# Patient Record
Sex: Female | Born: 1950 | Race: White | Hispanic: No | Marital: Married | Smoking: Former smoker
Health system: Southern US, Community
[De-identification: ages and names within clinical notes are randomized; demographics above are authoritative.]

## PROBLEM LIST (undated history)

## (undated) DIAGNOSIS — I951 Orthostatic hypotension: Secondary | ICD-10-CM

## (undated) DIAGNOSIS — Z950 Presence of cardiac pacemaker: Secondary | ICD-10-CM

## (undated) DIAGNOSIS — C50919 Malignant neoplasm of unspecified site of unspecified female breast: Secondary | ICD-10-CM

## (undated) DIAGNOSIS — K56609 Unspecified intestinal obstruction, unspecified as to partial versus complete obstruction: Secondary | ICD-10-CM

## (undated) DIAGNOSIS — R569 Unspecified convulsions: Secondary | ICD-10-CM

## (undated) DIAGNOSIS — Z720 Tobacco use: Secondary | ICD-10-CM

## (undated) DIAGNOSIS — M8580 Other specified disorders of bone density and structure, unspecified site: Secondary | ICD-10-CM

## (undated) DIAGNOSIS — J449 Chronic obstructive pulmonary disease, unspecified: Secondary | ICD-10-CM

## (undated) DIAGNOSIS — M1990 Unspecified osteoarthritis, unspecified site: Secondary | ICD-10-CM

## (undated) DIAGNOSIS — I219 Acute myocardial infarction, unspecified: Secondary | ICD-10-CM

## (undated) DIAGNOSIS — I442 Atrioventricular block, complete: Secondary | ICD-10-CM

## (undated) DIAGNOSIS — Z923 Personal history of irradiation: Secondary | ICD-10-CM

## (undated) DIAGNOSIS — G40909 Epilepsy, unspecified, not intractable, without status epilepticus: Secondary | ICD-10-CM

## (undated) DIAGNOSIS — K635 Polyp of colon: Secondary | ICD-10-CM

## (undated) HISTORY — PX: CARDIAC CATHETERIZATION: SHX172

## (undated) HISTORY — DX: Orthostatic hypotension: I95.1

## (undated) HISTORY — DX: Unspecified convulsions: R56.9

## (undated) HISTORY — DX: Tobacco use: Z72.0

## (undated) HISTORY — PX: CATARACT EXTRACTION: SUR2

## (undated) HISTORY — DX: Presence of cardiac pacemaker: Z95.0

## (undated) HISTORY — DX: Polyp of colon: K63.5

## (undated) HISTORY — DX: Epilepsy, unspecified, not intractable, without status epilepticus: G40.909

## (undated) HISTORY — DX: Unspecified intestinal obstruction, unspecified as to partial versus complete obstruction: K56.609

## (undated) HISTORY — PX: ABDOMINAL HYSTERECTOMY: SHX81

## (undated) HISTORY — DX: Atrioventricular block, complete: I44.2

## (undated) HISTORY — PX: LAPAROSCOPY: SHX197

## (undated) HISTORY — DX: Other specified disorders of bone density and structure, unspecified site: M85.80

---

## 1991-02-25 HISTORY — PX: OTHER SURGICAL HISTORY: SHX169

## 1999-01-09 ENCOUNTER — Other Ambulatory Visit: Admission: RE | Admit: 1999-01-09 | Discharge: 1999-01-09 | Payer: Self-pay | Admitting: Family Medicine

## 2001-01-25 HISTORY — PX: EXPLORATORY LAPAROTOMY: SUR591

## 2001-04-29 ENCOUNTER — Other Ambulatory Visit: Admission: RE | Admit: 2001-04-29 | Discharge: 2001-04-29 | Payer: Self-pay | Admitting: Family Medicine

## 2004-01-17 ENCOUNTER — Ambulatory Visit: Payer: Self-pay | Admitting: Family Medicine

## 2004-02-08 ENCOUNTER — Ambulatory Visit: Payer: Self-pay | Admitting: Family Medicine

## 2004-02-08 LAB — FECAL OCCULT BLOOD, GUAIAC: Fecal Occult Blood: NEGATIVE

## 2004-02-27 ENCOUNTER — Ambulatory Visit: Payer: Self-pay | Admitting: Family Medicine

## 2004-03-04 ENCOUNTER — Ambulatory Visit: Payer: Self-pay | Admitting: Family Medicine

## 2004-03-07 ENCOUNTER — Ambulatory Visit: Payer: Self-pay | Admitting: Family Medicine

## 2004-05-06 ENCOUNTER — Ambulatory Visit: Payer: Self-pay | Admitting: Family Medicine

## 2004-07-25 ENCOUNTER — Ambulatory Visit: Payer: Self-pay | Admitting: Family Medicine

## 2004-11-07 ENCOUNTER — Inpatient Hospital Stay: Payer: Self-pay | Admitting: Internal Medicine

## 2005-01-03 ENCOUNTER — Ambulatory Visit: Payer: Self-pay | Admitting: Family Medicine

## 2005-02-05 ENCOUNTER — Ambulatory Visit: Payer: Self-pay | Admitting: Family Medicine

## 2005-03-21 ENCOUNTER — Other Ambulatory Visit: Admission: RE | Admit: 2005-03-21 | Discharge: 2005-03-21 | Payer: Self-pay | Admitting: Family Medicine

## 2005-03-21 ENCOUNTER — Ambulatory Visit: Payer: Self-pay | Admitting: Family Medicine

## 2005-03-21 ENCOUNTER — Encounter: Payer: Self-pay | Admitting: Family Medicine

## 2005-03-21 LAB — CONVERTED CEMR LAB: Pap Smear: NORMAL

## 2005-05-19 ENCOUNTER — Ambulatory Visit: Payer: Self-pay | Admitting: Family Medicine

## 2005-07-24 ENCOUNTER — Ambulatory Visit: Payer: Self-pay | Admitting: Family Medicine

## 2005-08-06 ENCOUNTER — Ambulatory Visit: Payer: Self-pay | Admitting: Family Medicine

## 2005-10-03 ENCOUNTER — Ambulatory Visit: Payer: Self-pay | Admitting: Family Medicine

## 2005-10-09 ENCOUNTER — Ambulatory Visit: Payer: Self-pay | Admitting: Family Medicine

## 2005-11-24 HISTORY — PX: BREAST BIOPSY: SHX20

## 2006-04-10 ENCOUNTER — Ambulatory Visit: Payer: Self-pay | Admitting: Family Medicine

## 2006-04-30 ENCOUNTER — Ambulatory Visit: Payer: Self-pay | Admitting: Internal Medicine

## 2006-04-30 ENCOUNTER — Ambulatory Visit: Payer: Self-pay | Admitting: Family Medicine

## 2006-04-30 LAB — CONVERTED CEMR LAB
ALT: 19 units/L (ref 0–40)
AST: 24 units/L (ref 0–37)
Albumin: 4 g/dL (ref 3.5–5.2)
Alkaline Phosphatase: 80 units/L (ref 39–117)
BUN: 15 mg/dL (ref 6–23)
Calcium: 10.1 mg/dL (ref 8.4–10.5)
Chloride: 103 meq/L (ref 96–112)
Cholesterol: 173 mg/dL (ref 0–200)
Creatinine, Ser: 1 mg/dL (ref 0.4–1.2)
Eosinophils Absolute: 0.1 10*3/uL (ref 0.0–0.6)
GFR calc non Af Amer: 61 mL/min
HDL: 37.4 mg/dL — ABNORMAL LOW (ref >39.0)
Hemoglobin: 14.8 g/dL (ref 12.0–15.0)
LDL Cholesterol: 112 mg/dL — ABNORMAL HIGH (ref 0–99)
Lymphocytes Relative: 18.6 % (ref 12.0–46.0)
MCHC: 34.3 g/dL (ref 30.0–36.0)
Monocytes Absolute: 0.8 10*3/uL — ABNORMAL HIGH (ref 0.2–0.7)
Potassium: 4.4 meq/L (ref 3.5–5.1)
RBC: 4.85 M/uL (ref 3.87–5.11)
RDW: 13.3 % (ref 11.5–14.6)
Total CHOL/HDL Ratio: 4.6
Total Protein: 7.3 g/dL (ref 6.0–8.3)
WBC: 6.4 10*3/uL (ref 4.5–10.5)

## 2006-06-10 ENCOUNTER — Encounter: Payer: Self-pay | Admitting: Family Medicine

## 2006-06-10 DIAGNOSIS — Z87891 Personal history of nicotine dependence: Secondary | ICD-10-CM | POA: Insufficient documentation

## 2006-06-10 DIAGNOSIS — J45909 Unspecified asthma, uncomplicated: Secondary | ICD-10-CM | POA: Insufficient documentation

## 2006-06-10 DIAGNOSIS — Z87898 Personal history of other specified conditions: Secondary | ICD-10-CM | POA: Insufficient documentation

## 2006-06-10 DIAGNOSIS — G4700 Insomnia, unspecified: Secondary | ICD-10-CM | POA: Insufficient documentation

## 2006-07-27 ENCOUNTER — Encounter: Payer: Self-pay | Admitting: Family Medicine

## 2006-07-27 ENCOUNTER — Ambulatory Visit: Payer: Self-pay | Admitting: Family Medicine

## 2006-07-29 ENCOUNTER — Encounter (INDEPENDENT_AMBULATORY_CARE_PROVIDER_SITE_OTHER): Payer: Self-pay | Admitting: *Deleted

## 2006-08-14 ENCOUNTER — Ambulatory Visit: Payer: Self-pay | Admitting: Gastroenterology

## 2006-09-01 ENCOUNTER — Encounter: Payer: Self-pay | Admitting: Gastroenterology

## 2006-09-01 ENCOUNTER — Encounter: Payer: Self-pay | Admitting: Family Medicine

## 2006-09-01 ENCOUNTER — Ambulatory Visit: Payer: Self-pay | Admitting: Gastroenterology

## 2006-09-14 DIAGNOSIS — Z860100 Personal history of colon polyps, unspecified: Secondary | ICD-10-CM | POA: Insufficient documentation

## 2006-09-28 ENCOUNTER — Encounter: Payer: Self-pay | Admitting: Family Medicine

## 2007-06-14 ENCOUNTER — Ambulatory Visit: Payer: Self-pay | Admitting: Family Medicine

## 2007-08-02 ENCOUNTER — Encounter: Payer: Self-pay | Admitting: Family Medicine

## 2007-08-02 ENCOUNTER — Ambulatory Visit: Payer: Self-pay | Admitting: Family Medicine

## 2007-08-03 ENCOUNTER — Ambulatory Visit: Payer: Self-pay | Admitting: Family Medicine

## 2007-08-05 ENCOUNTER — Encounter (INDEPENDENT_AMBULATORY_CARE_PROVIDER_SITE_OTHER): Payer: Self-pay | Admitting: *Deleted

## 2007-08-05 LAB — CONVERTED CEMR LAB
AST: 28 units/L (ref 0–37)
Alkaline Phosphatase: 67 units/L (ref 39–117)
Basophils Absolute: 0 10*3/uL (ref 0.0–0.1)
CO2: 25 meq/L (ref 19–32)
Calcium: 9.7 mg/dL (ref 8.4–10.5)
Chloride: 103 meq/L (ref 96–112)
Creatinine, Ser: 1.1 mg/dL (ref 0.4–1.2)
HCT: 39.6 % (ref 36.0–46.0)
HDL: 40.2 mg/dL (ref >39.0)
MCHC: 34.4 g/dL (ref 30.0–36.0)
Neutro Abs: 4.3 10*3/uL (ref 1.4–7.7)
Sodium: 136 meq/L (ref 135–145)
Total CHOL/HDL Ratio: 4.2
Total Protein: 7.1 g/dL (ref 6.0–8.3)

## 2007-08-06 ENCOUNTER — Ambulatory Visit: Payer: Self-pay | Admitting: Family Medicine

## 2007-08-06 DIAGNOSIS — E875 Hyperkalemia: Secondary | ICD-10-CM | POA: Insufficient documentation

## 2007-08-09 LAB — CONVERTED CEMR LAB: Potassium: 5.1 meq/L (ref 3.5–5.1)

## 2007-11-09 ENCOUNTER — Ambulatory Visit: Payer: Self-pay | Admitting: Family Medicine

## 2007-11-24 ENCOUNTER — Ambulatory Visit: Payer: Self-pay | Admitting: Family Medicine

## 2007-11-24 DIAGNOSIS — H60399 Other infective otitis externa, unspecified ear: Secondary | ICD-10-CM | POA: Insufficient documentation

## 2007-12-02 ENCOUNTER — Ambulatory Visit: Payer: Self-pay | Admitting: Family Medicine

## 2007-12-02 DIAGNOSIS — M545 Low back pain, unspecified: Secondary | ICD-10-CM | POA: Insufficient documentation

## 2007-12-02 DIAGNOSIS — M461 Sacroiliitis, not elsewhere classified: Secondary | ICD-10-CM | POA: Insufficient documentation

## 2007-12-02 DIAGNOSIS — M5430 Sciatica, unspecified side: Secondary | ICD-10-CM

## 2007-12-14 ENCOUNTER — Ambulatory Visit: Payer: Self-pay | Admitting: Family Medicine

## 2007-12-14 DIAGNOSIS — IMO0002 Reserved for concepts with insufficient information to code with codable children: Secondary | ICD-10-CM | POA: Insufficient documentation

## 2007-12-15 ENCOUNTER — Encounter: Admission: RE | Admit: 2007-12-15 | Discharge: 2007-12-15 | Payer: Self-pay | Admitting: Family Medicine

## 2008-06-26 ENCOUNTER — Ambulatory Visit: Payer: Self-pay | Admitting: Family Medicine

## 2008-06-26 DIAGNOSIS — L723 Sebaceous cyst: Secondary | ICD-10-CM

## 2008-06-26 DIAGNOSIS — H6120 Impacted cerumen, unspecified ear: Secondary | ICD-10-CM | POA: Insufficient documentation

## 2008-06-26 DIAGNOSIS — L732 Hidradenitis suppurativa: Secondary | ICD-10-CM | POA: Insufficient documentation

## 2008-06-26 DIAGNOSIS — H6980 Other specified disorders of Eustachian tube, unspecified ear: Secondary | ICD-10-CM

## 2008-07-04 ENCOUNTER — Encounter (INDEPENDENT_AMBULATORY_CARE_PROVIDER_SITE_OTHER): Payer: Self-pay | Admitting: *Deleted

## 2008-08-15 ENCOUNTER — Ambulatory Visit: Payer: Self-pay | Admitting: Family Medicine

## 2008-08-21 ENCOUNTER — Encounter (INDEPENDENT_AMBULATORY_CARE_PROVIDER_SITE_OTHER): Payer: Self-pay | Admitting: *Deleted

## 2009-02-24 DIAGNOSIS — I219 Acute myocardial infarction, unspecified: Secondary | ICD-10-CM

## 2009-02-24 HISTORY — DX: Acute myocardial infarction, unspecified: I21.9

## 2009-08-02 ENCOUNTER — Ambulatory Visit: Payer: Self-pay | Admitting: Family Medicine

## 2009-08-05 LAB — CONVERTED CEMR LAB
ALT: 20 units/L (ref 0–35)
AST: 25 units/L (ref 0–37)
Albumin: 4.1 g/dL (ref 3.5–5.2)
BUN: 20 mg/dL (ref 6–23)
Bilirubin, Direct: 0.1 mg/dL (ref 0.0–0.3)
Creatinine, Ser: 0.9 mg/dL (ref 0.4–1.2)
Eosinophils Relative: 2.8 % (ref 0.0–5.0)
GFR calc non Af Amer: 68.53 mL/min (ref >60)
HDL: 58.4 mg/dL (ref >39.00)
Hemoglobin: 13.8 g/dL (ref 12.0–15.0)
LDL Cholesterol: 117 mg/dL — ABNORMAL HIGH (ref 0–99)
MCHC: 34.3 g/dL (ref 30.0–36.0)
Neutro Abs: 3.2 10*3/uL (ref 1.4–7.7)
Neutrophils Relative %: 61.9 % (ref 43.0–77.0)
Platelets: 250 10*3/uL (ref 150.0–400.0)
RDW: 13.6 % (ref 11.5–14.6)
TSH: 1.53 microintl units/mL (ref 0.35–5.50)
Total Bilirubin: 1 mg/dL (ref 0.3–1.2)
Total CHOL/HDL Ratio: 3
Total Protein: 6.8 g/dL (ref 6.0–8.3)

## 2009-08-06 ENCOUNTER — Ambulatory Visit: Payer: Self-pay | Admitting: Family Medicine

## 2009-08-06 DIAGNOSIS — Z780 Asymptomatic menopausal state: Secondary | ICD-10-CM | POA: Insufficient documentation

## 2009-08-10 ENCOUNTER — Ambulatory Visit: Payer: Self-pay | Admitting: Internal Medicine

## 2009-08-10 ENCOUNTER — Encounter: Payer: Self-pay | Admitting: Family Medicine

## 2009-08-14 ENCOUNTER — Encounter (INDEPENDENT_AMBULATORY_CARE_PROVIDER_SITE_OTHER): Payer: Self-pay | Admitting: *Deleted

## 2009-08-15 ENCOUNTER — Encounter (INDEPENDENT_AMBULATORY_CARE_PROVIDER_SITE_OTHER): Payer: Self-pay | Admitting: *Deleted

## 2009-08-16 ENCOUNTER — Encounter: Payer: Self-pay | Admitting: Family Medicine

## 2009-08-16 ENCOUNTER — Ambulatory Visit: Payer: Self-pay | Admitting: Family Medicine

## 2009-08-20 ENCOUNTER — Encounter (INDEPENDENT_AMBULATORY_CARE_PROVIDER_SITE_OTHER): Payer: Self-pay | Admitting: *Deleted

## 2009-09-06 ENCOUNTER — Ambulatory Visit: Payer: Self-pay | Admitting: Family Medicine

## 2009-09-11 LAB — CONVERTED CEMR LAB: Potassium: 5.3 meq/L — ABNORMAL HIGH (ref 3.5–5.1)

## 2009-09-14 ENCOUNTER — Encounter (INDEPENDENT_AMBULATORY_CARE_PROVIDER_SITE_OTHER): Payer: Self-pay | Admitting: *Deleted

## 2009-09-17 ENCOUNTER — Ambulatory Visit: Payer: Self-pay | Admitting: Gastroenterology

## 2009-10-01 ENCOUNTER — Ambulatory Visit: Payer: Self-pay | Admitting: Gastroenterology

## 2009-10-01 LAB — HM COLONOSCOPY

## 2009-10-03 ENCOUNTER — Encounter: Payer: Self-pay | Admitting: Gastroenterology

## 2009-10-19 ENCOUNTER — Ambulatory Visit: Payer: Self-pay | Admitting: Family Medicine

## 2009-10-19 DIAGNOSIS — L03119 Cellulitis of unspecified part of limb: Secondary | ICD-10-CM

## 2009-10-19 DIAGNOSIS — L02419 Cutaneous abscess of limb, unspecified: Secondary | ICD-10-CM | POA: Insufficient documentation

## 2009-10-22 ENCOUNTER — Ambulatory Visit: Payer: Self-pay | Admitting: Family Medicine

## 2010-03-24 LAB — CONVERTED CEMR LAB
BUN: 20 mg/dL (ref 6–23)
Basophils Relative: 0.4 % (ref 0.0–3.0)
Calcium: 10.1 mg/dL (ref 8.4–10.5)
Chloride: 109 meq/L (ref 96–112)
Creatinine, Ser: 1.1 mg/dL (ref 0.4–1.2)
Eosinophils Absolute: 0.2 10*3/uL (ref 0.0–0.7)
Eosinophils Relative: 3.4 % (ref 0.0–5.0)
GFR calc non Af Amer: 53.84 mL/min (ref >60)
Glucose, Bld: 92 mg/dL (ref 70–99)
HDL: 52.1 mg/dL (ref >39.00)
Hemoglobin: 12.7 g/dL (ref 12.0–15.0)
LDL Cholesterol: 84 mg/dL (ref 0–99)
Lymphocytes Relative: 28.4 % (ref 12.0–46.0)
MCV: 88.6 fL (ref 78.0–100.0)
Neutro Abs: 2.7 10*3/uL (ref 1.4–7.7)
Neutrophils Relative %: 56.6 % (ref 43.0–77.0)
Platelets: 249 10*3/uL (ref 150.0–400.0)
RBC: 4.13 M/uL (ref 3.87–5.11)
Total CHOL/HDL Ratio: 3
Triglycerides: 65 mg/dL (ref 0.0–149.0)
VLDL: 13 mg/dL (ref 0.0–40.0)
WBC: 4.8 10*3/uL (ref 4.5–10.5)

## 2010-03-28 NOTE — Miscellaneous (Signed)
   Clinical Lists Changes  Observations: Added new observation of MAMMO DUE: 08/17/2010 (08/16/2009 12:41) Added new observation of MAMMOGRAM: Normal (08/16/2009 12:41)

## 2010-03-28 NOTE — Letter (Signed)
Summary: Results Follow up Letter  [place] [facility]  [address] [place], [place] [address]   Phone: [phone]  Fax: [phone]    08/15/2009 MRN: [id number]    [facility] [address] [place], [place]  [address]    Dear Diana Cook,  The following are the results of your recent test(s):  Test         Result    Pap Smear:        Normal _____  Not Normal _____ Comments: ______________________________________________________ Cholesterol: LDL(Bad cholesterol):         Your goal is less than:         HDL (Good cholesterol):       Your goal is more than: Comments:  ______________________________________________________ Mammogram:        Normal _____  Not Normal _____ Comments:  ___________________________________________________________________ Hemoccult:        Normal _____  Not normal _______ Comments:    _____________________________________________________________________ Other Tests:   Bone Density:  Bone density is normal. Continue calcium and vitamin D.    We routinely do not discuss normal results over the telephone.  If you desire a copy of the results, or you have any questions about this information we can discuss them at your next office visit.   Sincerely,    Marne A. Milinda Antis, M.D.  MAT:lsf

## 2010-03-28 NOTE — Consult Note (Signed)
Summary: [facility]   Imported By: Sherian Rein 11/09/2009 08:26:59  _____________________________________________________________________  External Attachment:    Type:   Image     Comment:   External Document

## 2010-03-28 NOTE — Letter (Signed)
Summary: [facility] Instructions  [facility]  [address] [place], [place] [address]   Phone: [phone]  Fax: [phone]       TEMPIE GIBEAULT    1950-07-18    MRN: [id number]        Procedure Day /Date:  10/01/09  Monday     Arrival Time:  9:30am      Procedure Time:  10:30am     Location of Procedure:                    _ x_   [facility] Center (4th Floor)   PREPARATION FOR COLONOSCOPY WITH MOVIPREP   Starting 5 days prior to your procedure _8/3/11 _ do not eat nuts, seeds, popcorn, corn, beans, peas,  salads, or any raw vegetables.  Do not take any fiber supplements (e.g. Metamucil, Citrucel, and Benefiber).  THE DAY BEFORE YOUR PROCEDURE         DATE:   09/30/09   DAY:  Sunday  1.  Drink clear liquids the entire day-NO SOLID FOOD  2.  Do not drink anything colored red or purple.  Avoid juices with pulp.  No orange juice.  3.  Drink at least 64 oz. (8 glasses) of fluid/clear liquids during the day to prevent dehydration and help the prep work efficiently.  CLEAR LIQUIDS INCLUDE: Water Jello Ice Popsicles Tea (sugar ok, no milk/cream) Powdered fruit flavored drinks Coffee (sugar ok, no milk/cream) Gatorade Juice: apple, white grape, white cranberry  Lemonade Clear bullion, consomm, broth Carbonated beverages (any kind) Strained chicken noodle soup Hard Candy                             4.  In the morning, mix first dose of MoviPrep solution:    Empty 1 Pouch A and 1 Pouch B into the disposable container    Add lukewarm drinking water to the top line of the container. Mix to dissolve    Refrigerate (mixed solution should be used within 24 hrs)  5.  Begin drinking the prep at 5:00 p.m. The MoviPrep container is divided by 4 marks.   Every 15 minutes drink the solution down to the next mark (approximately 8 oz) until the full liter is complete.   6.  Follow completed prep with 16 oz of clear liquid of your choice (Nothing red or purple).   Continue to drink clear liquids until bedtime.  7.  Before going to bed, mix second dose of MoviPrep solution:    Empty 1 Pouch A and 1 Pouch B into the disposable container    Add lukewarm drinking water to the top line of the container. Mix to dissolve    Refrigerate  THE DAY OF YOUR PROCEDURE      DATE:   10/01/09  DAY:  Monday  Beginning at  5:30am  (5 hours before procedure):         1. Every 15 minutes, drink the solution down to the next mark (approx 8 oz) until the full liter is complete.  2. Follow completed prep with 16 oz. of clear liquid of your choice.    3. You may drink clear liquids until 8:30am   (2 HOURS BEFORE PROCEDURE).   MEDICATION INSTRUCTIONS  Unless otherwise instructed, you should take regular prescription medications with a small sip of water   as early as possible the morning of your procedure.  OTHER INSTRUCTIONS  You will need a responsible adult at least 62 years of age to accompany you and drive you home.   This person must remain in the waiting room during your procedure.  Wear loose fitting clothing that is easily removed.  Leave jewelry and other valuables at home.  However, you may wish to bring a book to read or  an iPod/MP3 player to listen to music as you wait for your procedure to start.  Remove all body piercing jewelry and leave at home.  Total time from sign-in until discharge is approximately 2-3 hours.  You should go home directly after your procedure and rest.  You can resume normal activities the  day after your procedure.  The day of your procedure you should not:   Drive   Make legal decisions   Operate machinery   Drink alcohol   Return to work  You will receive specific instructions about eating, activities and medications before you leave.    The above instructions have been reviewed and explained to me by   Clide Cliff, RN_______________________    I fully understand and can verbalize  these instructions _____________________________ Date _________

## 2010-03-28 NOTE — Assessment & Plan Note (Signed)
Summary: CPX/CLE   Vital Signs:  Patient profile:   60 year old female Height:      68 inches Weight:      159.25 pounds BMI:     24.30 O2 Sat:      95 % on Room air Temp:     98.1 degrees F oral Pulse rate:   76 / minute Pulse rhythm:   regular BP sitting:   122 / 68  (left arm) Cuff size:   regular  Vitals Entered By: Lewanda Rife LPN (August 06, 2009 2:28 PM)  O2 Flow:  Room air CC: CPX LMP complete hyst 2000   History of Present Illness: here for health mt exam   wt is down 31 lb today  doing wt watchers -- lost total of 32 lb  works well for her - but hard to keep up  is exercising too - does the [employer] tapes at least 3 times per week     bp good 122/68  colonosc 08- due for 3 y f/u for polyps in 7/11  tab--smokes occasionally - almost quit  still has a hold on her    hyst in past nl pap 07 no symptoms or problems    mam 6/10 self exam --no lumps on self exam   nl dexa in 08 lost one inch now  is not good about calcium     K was 5.3 this draw no mvi or other supplements  eats 1 banana per day  no oj eats a lot of canolope   lipids trig 56, HDL 58 and LDL 117  dtap was 09 Pneumovax 07 ? shingles status   takes septra and clinca lotion for breakouts as needed derm - Dr Orson Aloe  wants to switch from me   has lost her sex drive past menopause     Allergies: 1)  ! Codeine  Past History:  Past Medical History: Last updated: 06/14/2007 Asthma- as a child, mild now Seizure disorder colon polyps smoker all rhinitis   Past Surgical History: Last updated: 09/14/2006 Cataract extraction Hysterectomy- endometriosis (1993) Laparoscopy Dexa- normal (01/2000), normal (06/20040, normal (03/20080 Hospital- intestinal obstruction surgery (12/2000) Breast biopsy- benign inflammatory changes (11/2005) colonoscopy-polyp (tubular adenoma)7/08- rec re check in 3 years  Family History: Last updated: 06/14/2007 mother CVA, MI,  dementia  father CAD, parkinsons 2 uncles remotely with cancer ? kind   Social History: Last updated: 06/14/2007 Patient currently smokes.  exercises- does walking tapes  once in a while drinks wine married 40 years   Risk Factors: Smoking Status: current (06/10/2006)  Review of Systems General:  Denies fatigue and malaise. Eyes:  Denies blurring and eye pain. CV:  Denies chest pain or discomfort, palpitations, and shortness of breath with exertion. Resp:  Denies cough and wheezing. GI:  Denies abdominal pain, indigestion, nausea, and vomiting. GU:  Denies abnormal vaginal bleeding, discharge, and dysuria. MS:  Denies joint pain, joint redness, and joint swelling. Derm:  Denies lesion(s), poor wound healing, and rash. Psych:  mood is ok . Endo:  Denies cold intolerance, excessive thirst, excessive urination, and heat intolerance. Heme:  Denies abnormal bruising and bleeding.  Physical Exam  Ears:  mild cerumen R ear canal  Breasts:  No mass, nodules, thickening, tenderness, bulging, retraction, inflamation, nipple discharge or skin changes noted.   Lungs:  diffusely distant bs  no crackles or wheeze    Impression & Recommendations:  Problem # 1:  HEALTH MAINTENANCE EXAM (ICD-V70.0) Assessment Comment Only  reviewed health habits including diet, exercise and skin cancer prevention reviewed health maintenance list and family history commended on good habits and wt loss urged to totally quit smoking rev labs in detail today  Problem # 2:  POSTMENOPAUSAL STATUS (ICD-V49.81) Assessment: Unchanged order dexa in light of ht loss and risk factor of smoking  Orders: Radiology Referral (Radiology)  Problem # 3:  SMOKER (ICD-305.1) Assessment: Unchanged discussed in detail risks of smoking, and possible outcomes including COPD, vascular dz, cancer and also respiratory infections/sinus problems  pt voiced understanding-is ready to quit the occas cigarette enc her to try pc  of nicotine gum instead - and update The following medications were removed from the medication list:    Chantix Starting Month Pak 0.5 Mg X 11 & 1 Mg X 42 Tabs (Varenicline tartrate) .Marland Kitchen... Take by mouth as directed    Chantix 1 Mg Tabs (Varenicline tartrate) .Marland Kitchen... 1 by mouth two times a day after finishing starter pack  Orders: Radiology Referral (Radiology)  Problem # 4:  HIDRADENITIS SUPPURATIVA (OZH-086.57) Assessment: Unchanged  refil septra for as needed use  update if not imp  Orders: Prescription Created Electronically [phone])  Problem # 5:  HYPERKALEMIA (ICD-276.7) Assessment: Deteriorated mild - suspect from large fruit intake ? -- will cut down cantalope and bananas  re check 2 wk  Problem # 6:  OTHER SCREENING MAMMOGRAM (ICD-V76.12) Assessment: Comment Only annual mammogram scheduled adv pt to continue regular self breast exams non remarkable breast exam today   Orders: Radiology Referral (Radiology)  Complete Medication List: 1)  Calcium Plus D 600- 400iu  .... Take 2 by mouth daily 2)  Smz-tmp Ds 800-160 Mg Tabs (Sulfamethoxazole-trimethoprim) .... Take 1-2 by mouth daily as needed for outbreak of hydradenitis 3)  Clindamycin Phosphate 1 % Lotn (Clindamycin phosphate) .... Apply to affected area once daily as needed affected area for breakouts 4)  Flonase 50 Mcg/act Susp (Fluticasone propionate) .... 2 sprays in each nostril once daily as needed 5)  Proair Hfa 108 (90 Base) Mcg/act Aers (Albuterol sulfate) .... 2 puffs up to every 4 hours as needed wheezing  Patient Instructions: 1)  cut back on cantalope and bananas due to high potassium level  2)  schedule non fasting labs in 2 weeks -- K level for hyperkalemia  3)  we will schedule mammogram and bone density tests at check out  4)  the current recommendation for calcium intake is 1200-1500 mg daily with 1000 IU of vitamin D  5)  keep up the great diet and exercise  Prescriptions: PROAIR HFA 108 (90 BASE)  MCG/ACT AERS (ALBUTEROL SULFATE) 2 puffs up to every 4 hours as needed wheezing  #1 mdi x 5   Entered and Authorized by:   Judith Part MD   Signed by:   Judith Part MD on 08/06/2009   Method used:   Electronically to        [id number] ([facility])       [address]       [place], [place]  [address]       Ph: [phone]       Fax: [phone]   RxID:   [id number] FLONASE 50 MCG/ACT SUSP (FLUTICASONE PROPIONATE) 2 sprays in each nostril once daily as needed  #1 mdi x 11   Entered and Authorized by:   Judith Part MD   Signed by:   Judith Part MD on 08/06/2009   Method used:  Electronically to        [id number] (retail)       [address]       [place], [place]  [address]       Ph: [phone]       Fax: [phone]   RxID:   [id number] SMZ-TMP DS 800-160 MG  TABS (SULFAMETHOXAZOLE-TRIMETHOPRIM) take 1-2 by mouth daily as needed for outbreak of hydradenitis  #30 x 5   Entered and Authorized by:   Judith Part MD   Signed by:   Judith Part MD on 08/06/2009   Method used:   Electronically to        [id number] ([facility]       [address]       [place], [place]  [address]       Ph: [phone]       Fax: [phone]   RxID:   [id number]   Current Allergies (reviewed today): ! CODEINE

## 2010-03-28 NOTE — Assessment & Plan Note (Signed)
Summary: leg is swollen/alc   Vital Signs:  Patient profile:   60 year old female Height:      68 inches Weight:      159.75 pounds BMI:     24.38 Temp:     98.2 degrees F oral Pulse rate:   84 / minute Pulse rhythm:   regular BP sitting:   116 / 60  (left arm) Cuff size:   regular  Vitals Entered By: Lewanda Rife LPN (October 19, 2009 12:25 PM) CC: Left leg is swollen and hurts for 3 days, pain level now is 7. No known injury    History of Present Illness: went and had colonosc done and had to have bx hemorroids - got worse than usual   hydraadenitis - in groin is worse - takes clinda lotion an dthe sulfa abx  soaking with epsom salts   crack in root canal - and in process of getting 2 implants- not infected  L leg started to swell 2 days ago  last night was hot to the touch -- and tender to the touch  took 3 advil twice last night -- did help some  is worst over ant ankle - swelling there too  no insect bite or scratch/ no tick bites or fever  no shortness of breath  no knots in leg   quit smoking -- with nicotine gum - doing very well  doing this for about 2 months  is really proud   no exercise in 3 weeks due to the hydradenitis  also sees Dr Orson Aloe    Allergies: 1)  ! Codeine  Past History:  Past Medical History: Last updated: 06/14/2007 Asthma- as a child, mild now Seizure disorder colon polyps smoker all rhinitis   Past Surgical History: Last updated: 09/14/2006 Cataract extraction Hysterectomy- endometriosis (1993) Laparoscopy Dexa- normal (01/2000), normal (06/20040, normal (03/20080 Hospital- intestinal obstruction surgery (12/2000) Breast biopsy- benign inflammatory changes (11/2005) colonoscopy-polyp (tubular adenoma)7/08- rec re check in 3 years  Family History: Last updated: 06/14/2007 mother CVA, MI, dementia  father CAD, parkinsons 2 uncles remotely with cancer ? kind   Social History: Last updated: 06/14/2007 Patient  currently smokes.  exercises- does walking tapes  once in a while drinks wine married 40 years   Risk Factors: Smoking Status: current (06/10/2006)  Review of Systems General:  Complains of fatigue; denies chills and fever. Eyes:  Denies blurring and eye irritation. ENT:  Denies sore throat. CV:  Denies chest pain or discomfort, palpitations, and shortness of breath with exertion. Resp:  Denies cough and wheezing. GI:  Denies abdominal pain, change in bowel habits, nausea, and vomiting. GU:  Denies dysuria and urinary frequency. MS:  Denies muscle aches and cramps. Derm:  Complains of itching, lesion(s), and rash. Neuro:  Denies numbness and tingling. Heme:  Denies abnormal bruising and bleeding.  Physical Exam  General:  Well-developed,well-nourished,in no acute distress; alert,appropriate and cooperative throughout examination Head:  normocephalic, atraumatic, and no abnormalities observed.   Eyes:  vision grossly intact, pupils equal, pupils round, and pupils reactive to light.  no conjunctival pallor, injection or icterus  Mouth:  pharynx pink and moist.   Neck:  supple with full rom and no masses or thyromegally, no JVD or carotid bruit  Lungs:  diffusely distant bs  no crackles or wheeze  Heart:  Normal rate and regular rhythm. S1 and S2 normal without gallop, murmur, click, rub or other extra sounds. Abdomen:  soft, non-tender, and normal bowel sounds.  Msk:  see skin exam   Pulses:  R and L carotid,radial,femoral,dorsalis pedis and posterior tibial pulses are full and equal bilaterally Extremities:  no palp cords either leg neg homan's sign  Neurologic:  sensation intact to light touch, gait normal, and DTRs symmetrical and normal.   Skin:  many scabbed lesions with dried blood over mons pubis  inguinal folds are clear  legs are clear   3-4 cm area of superficial redness and swelling over distal L leg - just superior to ankle  is tender to touch nl rom  ankle Cervical Nodes:  No lymphadenopathy noted Inguinal Nodes:  No significant adenopathy Psych:  normal affect, talkative and pleasant    Impression & Recommendations:  Problem # 1:  HIDRADENITIS SUPPURATIVA (ICD-705.83) Assessment Deteriorated this looks worse than usual and a little atypical on mons pubis area - with scabbing (no excoriation) is midline so doubt shingles ref to derm in light of atypical presentation will stop sulfa ant take augmentin Orders: Dermatology Referral (Derma) Prescription Created Electronically [phone])  Problem # 2:  CELLULITIS, ANKLE, LEFT (ICD-682.6) Assessment: New  area of superficial redness and mild swelling overlying shin just above L ankle  is tender tx with augmentin and re check monday  if worse- adv to seek care in ER over weekend(or if fever or other symptoms) Her updated medication list for this problem includes:    Smz-tmp Ds 800-160 Mg Tabs (Sulfamethoxazole-trimethoprim) .Marland Kitchen... Take 1-2 by mouth daily as needed for outbreak of hydradenitis    Augmentin 875-125 Mg Tabs (Amoxicillin-pot clavulanate) .Marland Kitchen... 1 by mouth two times a day for 10 days  Orders: Prescription Created Electronically [phone])  Complete Medication List: 1)  Calcium Plus D 600- 400iu  .... Take 2 by mouth daily 2)  Smz-tmp Ds 800-160 Mg Tabs (Sulfamethoxazole-trimethoprim) .... Take 1-2 by mouth daily as needed for outbreak of hydradenitis 3)  Clindamycin Phosphate 1 % Lotn (Clindamycin phosphate) .... Apply to affected area once daily as needed affected area for breakouts 4)  Flonase 50 Mcg/act Susp (Fluticasone propionate) .... 2 sprays in each nostril once daily as needed 5)  Proair Hfa 108 (90 Base) Mcg/act Aers (Albuterol sulfate) .... 2 puffs up to every 4 hours as needed wheezing 6)  Advil 200 Mg Tabs (Ibuprofen) .... Otc as directed. 7)  Augmentin 875-125 Mg Tabs (Amoxicillin-pot clavulanate) .Marland Kitchen.. 1 by mouth two times a day for 10 days  Patient  Instructions: 1)  keep sores clean with antibacterial soap and water -and use triple antibiotic ointment (not neosporin)  2)  take the augmentin as directed  3)  stop the smz- tmp (sulfa abx)  4)  we will do derm referral at check out  5)  if worse swelling/ redness or any fever - call or go to er if after hours  6)  follow up with me monday  Prescriptions: AUGMENTIN 875-125 MG TABS (AMOXICILLIN-POT CLAVULANATE) 1 by mouth two times a day for 10 days  #20 x 0   Entered and Authorized by:   Judith Part MD   Signed by:   Judith Part MD on 10/19/2009   Method used:   Electronically to        [id number]       [address]       [place], [place]  [address]       Ph: [phone]       Fax: [phone]   RxID:   [id number]  Current Allergies (reviewed today): ! CODEINE

## 2010-03-28 NOTE — Letter (Signed)
Summary: Results Follow up Letter  [place] [facility]  [address] [place], [place] [address]   Phone: [phone]  Fax: [phone]    08/20/2009 MRN: [id number]    [facility] [address] [place], [place]  [address]    Dear Ms. Troup,  The following are the results of your recent test(s):  Test         Result    Pap Smear:        Normal _____  Not Normal _____ Comments: ______________________________________________________ Cholesterol: LDL(Bad cholesterol):         Your goal is less than:         HDL (Good cholesterol):       Your goal is more than: Comments:  ______________________________________________________ Mammogram:        Normal __X___  Not Normal _____ Comments:  Yearly follow up is recommended.   ___________________________________________________________________ Hemoccult:        Normal _____  Not normal _______ Comments:    _____________________________________________________________________ Other Tests:    We routinely do not discuss normal results over the telephone.  If you desire a copy of the results, or you have any questions about this information we can discuss them at your next office visit.   Sincerely,    Marne A. Milinda Antis, M.D.  MAT:lsf

## 2010-03-28 NOTE — Procedures (Signed)
Summary: Colonoscopy  Patient: Diana Cook Note: All result statuses are Final unless otherwise noted.  Tests: (1) Colonoscopy (COL)   COL Colonoscopy           DONE     [facility]     [address]     [place], [place]  [address]           COLONOSCOPY PROCEDURE REPORT           PATIENT:  Diana Cook, Diana Cook  MR#:  [id number]     BIRTHDATE:  [birth year], 59 yrs. old  GENDER:  female     ENDOSCOPIST:  Rachael Fee, MD     PROCEDURE DATE:  10/01/2009     PROCEDURE:  Colonoscopy with snare polypectomy     ASA CLASS:  Class II     INDICATIONS:  serrated adenoma removed 3 years ago     MEDICATIONS:   Fentanyl 75 mcg IV, Versed 6 mg IV           DESCRIPTION OF PROCEDURE:   After the risks benefits and     alternatives of the procedure were thoroughly explained, informed     consent was obtained.  Digital rectal exam was performed and     revealed no rectal masses.   The LB [id number] endoscope     was introduced through the anus and advanced to the cecum, which     was identified by both the appendix and ileocecal valve, without     limitations.  The quality of the prep was adequate, using     MoviPrep.  The instrument was then slowly withdrawn as the colon     was fully examined.     <<PROCEDUREIMAGES>>           FINDINGS:  Four sessile polyps were found, all were removed and     sent to pathology (jar 1). These ranged in size from 3 to 11mm,     most appeared hyperplastic. They were located in descending,     sigmoid and rectum segments. Three were removed with cold snare,     the largest (in descending colon) required snare/cautery (see     image3 and image8).  This was otherwise a normal examination of     the colon (see image9, image1, and image2).   Retroflexed views in     the rectum revealed no abnormalities.    The scope was then     withdrawn from the patient and the procedure completed.           COMPLICATIONS:  None     ENDOSCOPIC IMPRESSION:     1)  Four polyps, most appeared hyperplastic.  All were removed     and sent to pathology     2) Otherwise normal examination           RECOMMENDATIONS:     1) Given your personal history of adenomatous (pre-cancerous)     polyps, you will need a repeat colonoscopy in 5 years even if the     polyps removed today are not adenomatous.     2) You will receive a letter within 1-2 weeks with the results     of your biopsy as well as final recommendations. Please call my     office if you have not received a letter after 3 weeks.           ______________________________     Rachael Fee, MD  cc: Roxy Manns, MD           n.     Rosalie Doctor:   Rachael Fee at 10/01/2009 11:23 AM           Diana Cook, [id number]  Note: An exclamation mark (!) indicates a result that was not dispersed into the flowsheet. Document Creation Date: 10/01/2009 11:24 AM _______________________________________________________________________  (1) Order result status: Final Collection or observation date-time: 10/01/2009 11:17 Requested date-time:  Receipt date-time:  Reported date-time:  Referring Physician:   Ordering Physician: Rob Bunting [phone]) Specimen Source:  Source: Launa Grill Order Number: [id number] Lab site:   Appended Document: Colonoscopy recall     Procedures Next Due Date:    Colonoscopy: 09/2014

## 2010-03-28 NOTE — Miscellaneous (Signed)
Summary: BONE DENSITY  Clinical Lists Changes  Orders: Added new Test order of T-Bone Densitometry (77080) - Signed Added new Test order of T-Lumbar Vertebral Assessment (77082) - Signed 

## 2010-03-28 NOTE — Miscellaneous (Signed)
Summary: REC COL...AS.  Clinical Lists Changes  Medications: Added new medication of MOVIPREP 100 GM  SOLR (PEG-KCL-NACL-NASULF-NA ASC-C) As directed - Signed Rx of MOVIPREP 100 GM  SOLR (PEG-KCL-NACL-NASULF-NA ASC-C) As directed;  #1 x 0;  Signed;  Entered by: Clide Cliff RN;  Authorized by: Rachael Fee MD;  Method used: Electronically to [id number] [address], [place], [place]  [address], Ph: [phone], Fax: [phone] Observations: Added new observation of ALLERGY REV: Done (09/17/2009 9:32)    Prescriptions: MOVIPREP 100 GM  SOLR (PEG-KCL-NACL-NASULF-NA ASC-C) As directed  #1 x 0   Entered by:   Clide Cliff RN   Authorized by:   Rachael Fee MD   Signed by:   Clide Cliff RN on 09/17/2009   Method used:   Electronically to        [id number]       [address]       [place], [place]  [address]       Ph: [phone]       Fax: [phone]   RxID:   [id number]

## 2010-03-28 NOTE — Letter (Signed)
Summary: Results Letter  [facility]  [address] [place], [place] [address]   Phone: [phone]  Fax: [phone]        October 03, 2009 MRN: [id number]    [facility] [address] [place], [place]  [address]    Dear Ms. Misenheimer,   One of the polyps removed during your recent procedure was proven to be adenomatous.  These are pre-cancerous polyps that may have grown into cancers if they had not been removed.  Based on current nationally recognized surveillance guidelines, I recommend that you have a repeat colonoscopy in 5 years.  We will therefore put your information in our reminder system and will contact you in 5 years to schedule a repeat procedure.  Please call if you have any questions or concerns.       Sincerely,  Rachael Fee MD  This letter has been electronically signed by your physician.  Appended Document: Results Letter letter mailed

## 2010-03-28 NOTE — Letter (Signed)
Summary: Colonoscopy Letter  [facility]  [address] [place], [place] [address]   Phone: [phone]  Fax: [phone]      August 14, 2009 MRN: [id number]   [facility] [address] [place], [place]  [address]   Dear Ms. Simon,   According to your medical record, it is time for you to schedule a Colonoscopy. The American Cancer Society recommends this procedure as a method to detect early colon cancer. Patients with a family history of colon cancer, or a personal history of colon polyps or inflammatory bowel disease are at increased risk.  This letter has been generated based on the recommendations made at the time of your procedure. If you feel that in your particular situation this may no longer apply, please contact our office.  Please call our office at [phone] to schedule this appointment or to update your records at your earliest convenience.  Thank you for cooperating with [place] to provide you with the very best care possible.   Sincerely,  Rachael Fee, M.D.  [facility] [phone]

## 2010-08-08 ENCOUNTER — Telehealth: Payer: Self-pay | Admitting: *Deleted

## 2010-08-08 NOTE — Telephone Encounter (Signed)
Gentleman answered phone and said he was not sure where pt was but he would have her call our office back.

## 2010-08-08 NOTE — Telephone Encounter (Signed)
She may need both a mam and [place] -- I (or first avail doc) need to examine it first  Please sched appt

## 2010-08-08 NOTE — Telephone Encounter (Signed)
Patient feels a small mass on her right breast. She is asking for a referral to have a mammogram. She wants to go the [facility]

## 2010-08-09 NOTE — Telephone Encounter (Signed)
Patient notified as instructed by telephone. Pt already scheduled appt with Dr Milinda Antis 08/12/10.

## 2010-08-10 ENCOUNTER — Encounter: Payer: Self-pay | Admitting: Family Medicine

## 2010-08-12 ENCOUNTER — Ambulatory Visit (INDEPENDENT_AMBULATORY_CARE_PROVIDER_SITE_OTHER): Payer: 59 | Admitting: Family Medicine

## 2010-08-12 ENCOUNTER — Encounter: Payer: Self-pay | Admitting: Family Medicine

## 2010-08-12 DIAGNOSIS — Z1231 Encounter for screening mammogram for malignant neoplasm of breast: Secondary | ICD-10-CM | POA: Insufficient documentation

## 2010-08-12 DIAGNOSIS — N6019 Diffuse cystic mastopathy of unspecified breast: Secondary | ICD-10-CM

## 2010-08-12 NOTE — Assessment & Plan Note (Signed)
With hx of intermittent lumps and cyst in past  Does drink caffiene  Unable to feel any lump today-either myself or pt  Will send for her regular screening mam Counseled on caff and smoking  Enc to continue self exams

## 2010-08-12 NOTE — Progress Notes (Signed)
Subjective:    Patient ID: Diana Cook, female    DOB: 1950/12/24, 60 y.o.   MRN: [id number]  HPI Here for breast lump in r side  Is hard to tell and wanted to get it checked   Both of her best friends are going through breast cancer tx right now  Not painful  Not draining  No nipple d/c  Pt has hx of fibrocystic breasts with neg bx in 07 Had a cyst    Last nl mam 6/11- is due now  No family hx of breast cancer  Hx of smoker     Review of Systems Review of Systems  Constitutional: Negative for fever, appetite change, fatigue and unexpected weight change.  Eyes: Negative for pain and visual disturbance.  Respiratory: Negative for cough and shortness of breath.   Cardiovascular: Negative.for cp or soreness    Gastrointestinal: Negative for nausea, diarrhea and constipation.  Genitourinary: Negative for urgency and frequency.  Skin: Negative for pallor. or rash  Neurological: Negative for weakness, light-headedness, numbness and headaches.  Hematological: Negative for adenopathy. Does not bruise/bleed easily.  Psychiatric/Behavioral: Negative for dysphoric mood. The patient is not nervous/anxious.          Objective:   Physical Exam  Constitutional: She appears well-developed and well-nourished.  Neck: Normal range of motion. Neck supple. No JVD present. No thyromegaly present.  Cardiovascular: Normal rate, regular rhythm and normal heart sounds.   Pulmonary/Chest:       CTA- diffusely distant bs   Abdominal: Soft. Bowel sounds are normal. There is no tenderness.  Genitourinary: No breast swelling, tenderness, discharge or bleeding.       Generally dense and fibrocystic breasts No discrete masses noted No skin change or nipple d/c   Musculoskeletal: She exhibits no edema.  Lymphadenopathy:    She has no cervical adenopathy.  Neurological: She is alert. Coordination normal.  Skin: Skin is warm and dry. No rash noted. No erythema. No pallor.  Psychiatric: She  has a normal mood and affect.       Cheerful and pleasant    Patient Active Problem List  Diagnoses  . HYPERKALEMIA  . SMOKER  . OTITIS EXTERNA  . CERUMEN IMPACTION, RIGHT  . EUSTACHIAN TUBE DYSFUNCTION  . ASTHMA  . CELLULITIS, ANKLE, LEFT  . HIDRADENITIS SUPPURATIVA  . SEBACEOUS CYST  . SACROILIITIS, LEFT  . HERNIATED DISC  . BACK, LOWER, PAIN  . SCIATICA, LEFT  . SEIZURE DISORDER  . INSOMNIA  . HX, PERSONAL, COLONIC POLYPS  . POSTMENOPAUSAL STATUS  . Fibrocystic breast disease  . Other screening mammogram   Past Medical History  Diagnosis Date  . Asthma     as a child, mild now  . Seizure disorder   . Colon polyps     colonoscopy 7/08, tubular adenoma  . Smoker   . Allergic rhinitis    Past Surgical History  Procedure Date  . Cataract extraction   . Gyn surgery 1993    hysterectomy- form endometriosis  . Laparoscopy   . Breast biopsy 11/2005    benign inflammatory changes  . Small intestine surgery 12/2000    for blockage   History  Substance Use Topics  . Smoking status: Current Everyday Smoker -- 0.4 packs/day for 40 years  . Smokeless tobacco: Never Used  . Alcohol Use: Yes     Occasional wine   Family History  Problem Relation Age of Onset  . Stroke Mother   . Heart disease Mother  MI  . Dementia Mother   . Coronary artery disease Father   . Parkinsonism Father    Allergies  Allergen Reactions  . Codeine     REACTION: nausea   Current Outpatient Prescriptions on File Prior to Visit  Medication Sig Dispense Refill  . albuterol (PROAIR HFA) 108 (90 BASE) MCG/ACT inhaler Inhale 2 puffs into the lungs every 4 (four) hours as needed.        . Calcium Carbonate-Vit D-Min (CALCIUM 600+D PLUS MINERALS) 600-400 MG-UNIT TABS Take 2 by mouth daily       . clindamycin (CLEOCIN T) 1 % lotion Apply to affected area once daily as needed for break outs       . fluticasone (FLONASE) 50 MCG/ACT nasal spray 2 sprays each nostril daily as needed       .  ibuprofen (ADVIL,MOTRIN) 200 MG tablet Take otc as directed               Assessment & Plan:

## 2010-08-12 NOTE — Assessment & Plan Note (Signed)
Will sched mam at [facility]

## 2010-08-12 NOTE — Patient Instructions (Signed)
Try to cut caffiene- gradually one cup at a time Work on smoking too  We will refer you for mammogram/ screening at check out I do not feel a discrete breast lump today  Keep doing self exams If further problems follow up

## 2010-09-30 ENCOUNTER — Ambulatory Visit: Payer: Self-pay | Admitting: Family Medicine

## 2010-09-30 ENCOUNTER — Encounter: Payer: Self-pay | Admitting: Family Medicine

## 2010-10-01 ENCOUNTER — Telehealth: Payer: Self-pay | Admitting: Family Medicine

## 2010-10-01 NOTE — Telephone Encounter (Signed)
Mammogram is normal  Please note for flow sheet if you can  Due for next screening mammogram in 1 year    

## 2010-10-04 NOTE — Telephone Encounter (Signed)
Letter mailed to patient as instructed.Health maintenance updated. 

## 2010-10-05 LAB — HM COLONOSCOPY

## 2010-10-19 ENCOUNTER — Telehealth: Payer: Self-pay | Admitting: Family Medicine

## 2010-10-19 DIAGNOSIS — Z0000 Encounter for general adult medical examination without abnormal findings: Secondary | ICD-10-CM | POA: Insufficient documentation

## 2010-10-19 NOTE — Telephone Encounter (Signed)
Message copied by Judy Pimple on Sat Oct 19, 2010  6:44 PM ------      Message from: Baldomero Lamy      Created: Wed Oct 16, 2010  1:42 PM      Regarding: cpx labs wed 8/29       Please order  future cpx labs for pt's upcomming lab appt.      Thanks      Rodney Booze

## 2010-10-23 ENCOUNTER — Other Ambulatory Visit (INDEPENDENT_AMBULATORY_CARE_PROVIDER_SITE_OTHER): Payer: 59

## 2010-10-23 DIAGNOSIS — Z0000 Encounter for general adult medical examination without abnormal findings: Secondary | ICD-10-CM

## 2010-10-23 LAB — COMPREHENSIVE METABOLIC PANEL
Albumin: 4.2 g/dL (ref 3.5–5.2)
Alkaline Phosphatase: 78 U/L (ref 39–117)
Calcium: 9.6 mg/dL (ref 8.4–10.5)
GFR: 76.09 mL/min (ref >60.00)
Total Bilirubin: 0.7 mg/dL (ref 0.3–1.2)

## 2010-10-23 LAB — LIPID PANEL
HDL: 58.3 mg/dL (ref >39.00)
VLDL: 18.6 mg/dL (ref 0.0–40.0)

## 2010-10-23 LAB — CBC WITH DIFFERENTIAL/PLATELET
Basophils Absolute: 0.1 10*3/uL (ref 0.0–0.1)
Eosinophils Absolute: 0.2 10*3/uL (ref 0.0–0.7)
Hemoglobin: 12.7 g/dL (ref 12.0–15.0)
Lymphocytes Relative: 26.5 % (ref 12.0–46.0)
MCHC: 33.2 g/dL (ref 30.0–36.0)
MCV: 91.2 fl (ref 78.0–100.0)
Neutrophils Relative %: 61.2 % (ref 43.0–77.0)
RDW: 13.1 % (ref 11.5–14.6)
WBC: 6.2 10*3/uL (ref 4.5–10.5)

## 2010-10-30 ENCOUNTER — Encounter: Payer: Self-pay | Admitting: Family Medicine

## 2010-10-30 ENCOUNTER — Ambulatory Visit (INDEPENDENT_AMBULATORY_CARE_PROVIDER_SITE_OTHER): Payer: 59 | Admitting: Family Medicine

## 2010-10-30 DIAGNOSIS — J45909 Unspecified asthma, uncomplicated: Secondary | ICD-10-CM

## 2010-10-30 DIAGNOSIS — H6980 Other specified disorders of Eustachian tube, unspecified ear: Secondary | ICD-10-CM

## 2010-10-30 DIAGNOSIS — R0602 Shortness of breath: Secondary | ICD-10-CM | POA: Insufficient documentation

## 2010-10-30 DIAGNOSIS — F17200 Nicotine dependence, unspecified, uncomplicated: Secondary | ICD-10-CM

## 2010-10-30 DIAGNOSIS — Z86010 Personal history of colonic polyps: Secondary | ICD-10-CM

## 2010-10-30 DIAGNOSIS — Z0000 Encounter for general adult medical examination without abnormal findings: Secondary | ICD-10-CM

## 2010-10-30 MED ORDER — FLUTICASONE PROPIONATE 50 MCG/ACT NA SUSP: 2.0000 | Freq: Every day | NASAL | Status: DC

## 2010-10-30 MED ORDER — ALBUTEROL SULFATE HFA 108 (90 BASE) MCG/ACT IN AERS: 2.0000 | INHALATION_SPRAY | RESPIRATORY_TRACT | Status: DC | PRN

## 2010-10-30 NOTE — Assessment & Plan Note (Signed)
Once monthly episodes of shortness of breath - 2-3 min at a time with no wheeze but diff getting air in (? Stridor)  Pt states this is not provoked by anx but produces it  Albuterol does help She is interested in pulm ref for this and eval of lung status being a smoker  Disc smoking today and need to quit-pt aware

## 2010-10-30 NOTE — Progress Notes (Signed)
Subjective:    Patient ID: Diana Cook, female    DOB: 08-15-1950, 60 y.o.   MRN: [id number]  HPI Here for annual health mt exam and to review chronic med problems  Nothing new going on at all  Just aches and pains a little as she gets older    hyst in past for endometriosis Pap hx - no abnormal paps  No gyn issues or probs   Zoster status - has had shingles before twice  Td 4/09 Up to date on pneumovax   Mam nl 8/12 Self exam - no lumps or changes   colonosc 8/11- had adenomatous polyp so will need recall in 5 years   dexa nl in 08 Ca and D- is taking those - is good about that  Is exercising - using [employer] and walks outdoors   Smoking status= always has desire to quit but the willpower is not there Smokes 1/2ppd  Very difficult to overcome this   [facility] Lab Results  Component Value Date   CHOL 154 10/23/2010   CHOL 187 08/02/2009   CHOL 149 08/15/2008   Lab Results  Component Value Date   HDL 58.30 10/23/2010   HDL 11.91 08/02/2009   HDL 52.10 08/15/2008   Lab Results  Component Value Date   LDLCALC 77 10/23/2010   LDLCALC 117* 08/02/2009   LDLCALC 84 08/15/2008   Lab Results  Component Value Date   TRIG 93.0 10/23/2010   TRIG 56.0 08/02/2009   TRIG 65.0 08/15/2008   Lab Results  Component Value Date   CHOLHDL 3 10/23/2010   CHOLHDL 3 08/02/2009   CHOLHDL 3 08/15/2008   No results found for this basename: LDLDIRECT       Wt is up 4 lb   Still has episodes of sob about once per month - 2 -3 minutes - not during stressful times -- but does get paniced when it happens  No chest pain and not exertional  Since she is a smoker this worries her  Has had nl cxr in the past  Would like to see a lung specialist   Patient Active Problem List  Diagnoses  . SMOKER  . EUSTACHIAN TUBE DYSFUNCTION  . ASTHMA  . HIDRADENITIS SUPPURATIVA  . SEBACEOUS CYST  . SACROILIITIS, LEFT  . HERNIATED DISC  . BACK, LOWER, PAIN  . SCIATICA, LEFT  . SEIZURE  DISORDER  . INSOMNIA  . HX, PERSONAL, COLONIC POLYPS  . POSTMENOPAUSAL STATUS  . Fibrocystic breast disease  . Other screening mammogram  . Routine general medical examination at a health care facility  . Shortness of breath   Past Medical History  Diagnosis Date  . Asthma     as a child, mild now  . Seizure disorder   . Colon polyps     colonoscopy 7/08, tubular adenoma  . Smoker   . Allergic rhinitis    Past Surgical History  Procedure Date  . Cataract extraction   . Gyn surgery 1993    hysterectomy- form endometriosis  . Laparoscopy   . Breast biopsy 11/2005    benign inflammatory changes  . Small intestine surgery 12/2000    for blockage   History  Substance Use Topics  . Smoking status: Current Everyday Smoker -- 0.4 packs/day for 40 years  . Smokeless tobacco: Never Used  . Alcohol Use: Yes     Occasional wine   Family History  Problem Relation Age of Onset  . Stroke Mother   .  Heart disease Mother     MI  . Dementia Mother   . Coronary artery disease Father   . Parkinsonism Father    Allergies  Allergen Reactions  . Codeine     REACTION: nausea   Current Outpatient Prescriptions on File Prior to Visit  Medication Sig Dispense Refill  . Calcium Carbonate-Vit D-Min (CALCIUM 600+D PLUS MINERALS) 600-400 MG-UNIT TABS Take 2 by mouth daily       . ibuprofen (ADVIL,MOTRIN) 200 MG tablet Take otc as directed       . clindamycin (CLEOCIN T) 1 % lotion Apply to affected area once daily as needed for break outs            Review of Systems Review of Systems  Constitutional: Negative for fever, appetite change, fatigue and unexpected weight change.  Eyes: Negative for pain and visual disturbance.  Respiratory: Negative for cough and chest wall pain  Cardiovascular: Negative for cp or palpitations    Gastrointestinal: Negative for nausea, diarrhea and constipation.  Genitourinary: Negative for urgency and frequency.  Skin: Negative for pallor or rash     Neurological: Negative for weakness, light-headedness, numbness and headaches.  Hematological: Negative for adenopathy. Does not bruise/bleed easily.  Psychiatric/Behavioral: Negative for dysphoric mood. The patient is not nervous/anxious.          Objective:   Physical Exam  Constitutional: She appears well-developed and well-nourished. No distress.  HENT:  Head: Normocephalic and atraumatic.  Right Ear: External ear normal.  Left Ear: External ear normal.  Nose: Nose normal.  Mouth/Throat: Oropharynx is clear and moist.       Scant cerumen R ear  Eyes: Conjunctivae and EOM are normal. Pupils are equal, round, and reactive to light.  Neck: Normal range of motion. Neck supple. No JVD present. Carotid bruit is not present. No thyromegaly present.  Cardiovascular: Normal rate, regular rhythm, normal heart sounds and intact distal pulses.   Pulmonary/Chest: Effort normal and breath sounds normal. No respiratory distress. She has no wheezes. She has no rales. She exhibits no tenderness.       Diffusely distant bs without wheeze or stridor  Abdominal: Soft. Bowel sounds are normal. She exhibits no distension and no mass. There is no tenderness.  Genitourinary: No breast swelling, tenderness, discharge or bleeding.       Dense generally fibrocystic breast tissue  Musculoskeletal: Normal range of motion. She exhibits no edema and no tenderness.  Lymphadenopathy:    She has no cervical adenopathy.  Neurological: She is alert. She has normal reflexes. No cranial nerve deficit. Coordination normal.  Skin: Skin is warm and dry. No rash noted. No erythema. No pallor.       Very tanned with a few lentigos   Psychiatric: She has a normal mood and affect.          Assessment & Plan:

## 2010-10-30 NOTE — Assessment & Plan Note (Signed)
occ use of albuterol hfa - not exercise induced Disc smoking cessation

## 2010-10-30 NOTE — Assessment & Plan Note (Signed)
Refilled flonase which is helpful for this

## 2010-10-30 NOTE — Assessment & Plan Note (Signed)
Up to date on colonosc this summer Will repeat in 5 years

## 2010-10-30 NOTE — Assessment & Plan Note (Signed)
Reviewed health habits including diet and exercise and skin cancer prevention Also reviewed health mt list, fam hx and immunizations   Rev mammogram  Is up to date on pneumovax for another year  Will consider zostavax Disc smoking cessation

## 2010-10-30 NOTE — Assessment & Plan Note (Signed)
Long discussion today about smoking cessation  Disc in detail risks of smoking and possible outcomes including copd, vascular/ heart disease, cancer , respiratory and sinus infections  Pt voices understanding  Pt is getting mentally ready to quit

## 2010-10-30 NOTE — Patient Instructions (Addendum)
If you are interested in shingles vaccine in future - call your insurance company to see how coverage is and call us to schedule Keep working on quitting smoking - for all aspects of your health- and let me know if you need help We will do pulmonary referral for shortness of breath at check out

## 2010-11-26 ENCOUNTER — Institutional Professional Consult (permissible substitution): Payer: 59 | Admitting: Pulmonary Disease

## 2011-09-01 ENCOUNTER — Ambulatory Visit: Payer: 59 | Admitting: Family Medicine

## 2011-09-02 ENCOUNTER — Ambulatory Visit (INDEPENDENT_AMBULATORY_CARE_PROVIDER_SITE_OTHER): Payer: 59 | Admitting: Family Medicine

## 2011-09-02 ENCOUNTER — Encounter: Payer: Self-pay | Admitting: Family Medicine

## 2011-09-02 VITALS — BP 123/78 | HR 66 | Temp 97.7°F | Ht 68.0 in | Wt 188.8 lb

## 2011-09-02 DIAGNOSIS — M48061 Spinal stenosis, lumbar region without neurogenic claudication: Secondary | ICD-10-CM

## 2011-09-02 MED ORDER — CYCLOBENZAPRINE HCL 10 MG PO TABS: 10.0000 mg | ORAL_TABLET | Freq: Three times a day (TID) | ORAL | Status: AC | PRN

## 2011-09-02 NOTE — Progress Notes (Signed)
Subjective:    Patient ID: Diana Cook, female    DOB: [birth year], 61 y.o.   MRN: [id number]  HPI Here for back pain [place] the beach - when she threw her back out , was doing exercise video and thinks she moved the wrong way  Pain similar to past  Laid in bed 3-4 d and got some better Much improved today  Both hips and right leg hurts  Has not done PT  Is interested  Is trying to keep in shape    Quit smoking finally!!!- is excited  Gained her weight back   Pt does have hx of sciatica caused by some disc dz lumbar and spinal stenosis in the past  Rev MRI from 2009  Patient Active Problem List  Diagnosis  . SMOKER  . EUSTACHIAN TUBE DYSFUNCTION  . ASTHMA  . HIDRADENITIS SUPPURATIVA  . SEBACEOUS CYST  . SACROILIITIS, LEFT  . HERNIATED DISC  . BACK, LOWER, PAIN  . SCIATICA, LEFT  . SEIZURE DISORDER  . INSOMNIA  . HX, PERSONAL, COLONIC POLYPS  . POSTMENOPAUSAL STATUS  . Fibrocystic breast disease  . Other screening mammogram  . Routine general medical examination at a health care facility  . Shortness of breath   Past Medical History  Diagnosis Date  . Asthma     as a child, mild now  . Seizure disorder   . Colon polyps     colonoscopy 7/08, tubular adenoma  . Smoker   . Allergic rhinitis    Past Surgical History  Procedure Date  . Cataract extraction   . Gyn surgery 1993    hysterectomy- form endometriosis  . Laparoscopy   . Breast biopsy 11/2005    benign inflammatory changes  . Small intestine surgery 12/2000    for blockage   History  Substance Use Topics  . Smoking status: Former Smoker -- 0.4 packs/day for 40 years    Types: Cigarettes    Quit date: 11/11/2010  . Smokeless tobacco: Never Used  . Alcohol Use: Yes     Occasional wine   Family History  Problem Relation Age of Onset  . Stroke Mother   . Heart disease Mother     MI  . Dementia Mother   . Coronary artery disease Father   . Parkinsonism Father    Allergies  Allergen  Reactions  . Codeine     REACTION: nausea   Current Outpatient Prescriptions on File Prior to Visit  Medication Sig Dispense Refill  . albuterol (PROAIR HFA) 108 (90 BASE) MCG/ACT inhaler Inhale 2 puffs into the lungs every 4 (four) hours as needed.  1 Inhaler  5  . Calcium Carbonate-Vit D-Min (CALCIUM 600+D PLUS MINERALS) 600-400 MG-UNIT TABS Take 2 by mouth daily       . clindamycin (CLEOCIN T) 1 % lotion Apply to affected area once daily as needed for break outs       . fluticasone (FLONASE) 50 MCG/ACT nasal spray Place 2 sprays into the nose daily. 2 sprays each nostril daily as needed  16 g  11  . ibuprofen (ADVIL,MOTRIN) 200 MG tablet Take otc as directed             Review of Systems Review of Systems  Constitutional: Negative for fever, appetite change, fatigue and unexpected weight change.  Eyes: Negative for pain and visual disturbance.  Respiratory: Negative for cough and shortness of breath.   Cardiovascular: Negative for cp or palpitations    Gastrointestinal:  Negative for nausea, diarrhea and constipation.  Genitourinary: Negative for urgency and frequency. neg for dysuria or blood in urine  MSK pos for low back pain rad to R leg- that is much imp today, neg for joint swelling  Skin: Negative for pallor or rash   Neurological: Negative for weakness, light-headedness, numbness and headaches.  Hematological: Negative for adenopathy. Does not bruise/bleed easily.  Psychiatric/Behavioral: Negative for dysphoric mood. The patient is not nervous/anxious.         Objective:   Physical Exam  Constitutional: She appears well-developed and well-nourished. No distress.       overwt and well appearing   HENT:  Head: Normocephalic and atraumatic.  Mouth/Throat: Oropharynx is clear and moist.  Eyes: Conjunctivae and EOM are normal. Pupils are equal, round, and reactive to light. No scleral icterus.  Neck: Normal range of motion. Neck supple. No JVD present. No thyromegaly  present.  Cardiovascular: Normal rate and regular rhythm.   Pulmonary/Chest: Effort normal and breath sounds normal.       Diffusely distant bs   Abdominal: Soft. Bowel sounds are normal. She exhibits no distension. There is no tenderness.       No suprapubic tenderness    Musculoskeletal: She exhibits tenderness. She exhibits no edema.       Mild R perilumbar tenderness  No spinal tenderness  Neg SLR Nl rom hips Flex 90 deg/ ext 10 deg  Nl twist and gait  Lymphadenopathy:    She has no cervical adenopathy.  Neurological: She is alert. She has normal reflexes. She displays no atrophy. No cranial nerve deficit or sensory deficit. She exhibits normal muscle tone. Coordination normal.  Skin: Skin is warm and dry. No rash noted.  Psychiatric: She has a normal mood and affect.          Assessment & Plan:

## 2011-09-02 NOTE — Patient Instructions (Addendum)
congrats on quitting smoking !! We will refer you to PT for back pain at check out  I sent muscle relaxer to the pharmacy - use it as needed  Update if not starting to improve in a week or if worsening

## 2011-09-02 NOTE — Assessment & Plan Note (Signed)
With hx of disc dz/ spinal stenosis lumbar  Will ref to PT to prevent further occurences  Is doing well with exercise Will continue aleve with food as needed Flexeril px prn

## 2011-09-25 DIAGNOSIS — I442 Atrioventricular block, complete: Secondary | ICD-10-CM

## 2011-09-25 HISTORY — DX: Atrioventricular block, complete: I44.2

## 2011-10-08 ENCOUNTER — Telehealth: Payer: Self-pay

## 2011-10-08 DIAGNOSIS — M48061 Spinal stenosis, lumbar region without neurogenic claudication: Secondary | ICD-10-CM

## 2011-10-08 DIAGNOSIS — M545 Low back pain: Secondary | ICD-10-CM

## 2011-10-08 NOTE — Telephone Encounter (Signed)
Thanks for the update - sorry she is not feeling better Next step is orthopedic ref I will do ref and let her know pt care coordinator will call her thanks

## 2011-10-08 NOTE — Telephone Encounter (Signed)
Pt can do PT exercises with no problem; sitting or laying no pain but when pt stands or walks extreme pain in left hip. Pain level 8.  Aleve and Flexeril not helping pain. Each PT visit cost pt $ 50.00. Pt request [facility]. [facility].Please advise.

## 2011-10-08 NOTE — Telephone Encounter (Signed)
Ref done for hip pain  Our coordinator will call her -make sure she tells her she wants to go to [place] [facility]

## 2011-10-08 NOTE — Telephone Encounter (Signed)
Patient stated that she would prefer a Dr.with [facility] in [place].

## 2011-10-24 HISTORY — PX: PACEMAKER INSERTION: SHX728

## 2011-10-29 ENCOUNTER — Telehealth: Payer: Self-pay | Admitting: Family Medicine

## 2011-10-30 NOTE — Telephone Encounter (Signed)
error 

## 2011-10-31 ENCOUNTER — Encounter: Payer: Self-pay | Admitting: Family Medicine

## 2011-11-03 ENCOUNTER — Ambulatory Visit (INDEPENDENT_AMBULATORY_CARE_PROVIDER_SITE_OTHER): Payer: 59 | Admitting: Family Medicine

## 2011-11-03 ENCOUNTER — Encounter: Payer: Self-pay | Admitting: Family Medicine

## 2011-11-03 VITALS — BP 126/69 | HR 69 | Temp 97.8°F | Ht 68.0 in | Wt 189.0 lb

## 2011-11-03 DIAGNOSIS — Z950 Presence of cardiac pacemaker: Secondary | ICD-10-CM | POA: Insufficient documentation

## 2011-11-03 DIAGNOSIS — I442 Atrioventricular block, complete: Secondary | ICD-10-CM | POA: Insufficient documentation

## 2011-11-03 NOTE — Progress Notes (Signed)
Subjective:    Patient ID: Diana Cook, female    DOB: [birth year], 61 y.o.   MRN: [id number]  HPI Here after cardiac hosp - discovered complete heart block and had to get a pacemaker  Was out of town - with friends and grandchild Had been active- walking/ putt putt golf-- noticed one day worse sob with exertion  Then one night had some pain in the middle of upper back- was very sharp/stabbing  She thought the pain was from shingles  Took 2 aspirin  Had to go to the hospital-- ER -- [address] -- [place]   Discovered a complete heart block  Her enzymes did bump  Did a cath  ? If MI -- and said that her arteries were pretty clear   Needs to be set up with cardiologist here   Lab Results  Component Value Date   CHOL 154 10/23/2010   HDL 58.30 10/23/2010   LDLCALC 77 10/23/2010   TRIG 93.0 10/23/2010   CHOLHDL 3 10/23/2010    Some discomfort from her pacemaker  Takes one pain pill occ at night , and occ some tylenol at night    Patient Active Problem List  Diagnosis  . Former smoker  . EUSTACHIAN TUBE DYSFUNCTION  . ASTHMA  . HIDRADENITIS SUPPURATIVA  . SEBACEOUS CYST  . SACROILIITIS, LEFT  . HERNIATED DISC  . BACK, LOWER, PAIN  . SCIATICA, LEFT  . SEIZURE DISORDER  . INSOMNIA  . HX, PERSONAL, COLONIC POLYPS  . POSTMENOPAUSAL STATUS  . Fibrocystic breast disease  . Other screening mammogram  . Routine general medical examination at a health care facility  . Lumbar spinal stenosis   Past Medical History  Diagnosis Date  . Asthma     as a child, mild now  . Seizure disorder   . Colon polyps     colonoscopy 7/08, tubular adenoma  . Smoker   . Allergic rhinitis   . Complete heart block 8/13    pacemaker    Past Surgical History  Procedure Date  . Cataract extraction   . Gyn surgery 1993    hysterectomy- form endometriosis  . Laparoscopy   . Breast biopsy 11/2005    benign inflammatory changes  . Small intestine surgery 12/2000    for blockage    . Pacemaker insertion 8/13    for complete heart block   History  Substance Use Topics  . Smoking status: Former Smoker -- 0.4 packs/day for 40 years    Types: Cigarettes    Quit date: 11/11/2010  . Smokeless tobacco: Never Used  . Alcohol Use: Yes     Occasional wine   Family History  Problem Relation Age of Onset  . Stroke Mother   . Heart disease Mother     MI  . Dementia Mother   . Coronary artery disease Father   . Parkinsonism Father    Allergies  Allergen Reactions  . Codeine     REACTION: nausea  . Morphine And Related    Current Outpatient Prescriptions on File Prior to Visit  Medication Sig Dispense Refill  . Calcium Carbonate-Vit D-Min (CALCIUM 600+D PLUS MINERALS) 600-400 MG-UNIT TABS Take 2 by mouth daily       . clindamycin (CLEOCIN T) 1 % lotion Apply to affected area once daily as needed for break outs       . fluticasone (FLONASE) 50 MCG/ACT nasal spray Place 2 sprays into the nose daily. 2 sprays each nostril  daily as needed  16 g  11  . pantoprazole (PROTONIX) 40 MG tablet Take 40 mg by mouth daily.      Marland Kitchen albuterol (PROAIR HFA) 108 (90 BASE) MCG/ACT inhaler Inhale 2 puffs into the lungs every 4 (four) hours as needed.  1 Inhaler  5  . ibuprofen (ADVIL,MOTRIN) 200 MG tablet Take otc as directed          Review of Systems Review of Systems  Constitutional: Negative for fever, appetite change, fatigue and unexpected weight change.  Eyes: Negative for pain and visual disturbance.  Respiratory: Negative for cough and shortness of breath.   Cardiovascular: Negative for cp or palpitations   (now feels fine) Gastrointestinal: Negative for nausea, diarrhea and constipation.  Genitourinary: Negative for urgency and frequency.  Skin: Negative for pallor or rash   Neurological: Negative for weakness, light-headedness, numbness and headaches.  Hematological: Negative for adenopathy. Does not bruise/bleed easily.  Psychiatric/Behavioral: Negative for dysphoric  mood. The patient is not nervous/anxious.         Objective:   Physical Exam  Constitutional: She appears well-developed and well-nourished. No distress.  HENT:  Head: Normocephalic and atraumatic.  Mouth/Throat: Oropharynx is clear and moist.  Eyes: Conjunctivae normal and EOM are normal. Pupils are equal, round, and reactive to light. Right eye exhibits no discharge. Left eye exhibits no discharge. No scleral icterus.  Neck: Normal range of motion. Neck supple. No JVD present. Carotid bruit is not present. No tracheal deviation present.  Cardiovascular: Normal rate, regular rhythm, normal heart sounds and intact distal pulses.  Exam reveals no gallop.   Pulmonary/Chest: Effort normal and breath sounds normal. No respiratory distress. She has no wheezes.  Abdominal: Soft. Bowel sounds are normal. She exhibits no distension and no abdominal bruit. There is no tenderness.  Musculoskeletal: She exhibits no edema.  Lymphadenopathy:    She has no cervical adenopathy.  Neurological: She is alert. She has normal reflexes. No cranial nerve deficit. Coordination normal.  Skin: Skin is warm and dry. No rash noted. No erythema. No pallor.       Pacemaker site in L chest appears to be healing well and non tender  Psychiatric: She has a normal mood and affect.          Assessment & Plan:

## 2011-11-03 NOTE — Patient Instructions (Addendum)
I'm glad you are feeling better  We will do cardiology referral at check out  Take care of yourself and eat a low cholesterol diet   (Avoid red meat/ fried foods/ egg yolks/ fatty breakfast meats/ butter, cheese and high fat dairy/ and shellfish  ) Please call / go to ER if symptoms return

## 2011-11-06 NOTE — Assessment & Plan Note (Signed)
S/p hospitalization out the area and pacemaker placement  Pt doing well/stable now Records reviewed and now out to be scanned (incl tests/ labs/ surg proc) Pt did have enzyme elevation -thought to be due to block, no sig blockage on cath Pt is asymptomatic and with nl exam Not smoking Will ref to cardiology for further management

## 2011-11-06 NOTE — Assessment & Plan Note (Signed)
New after complete heart block - pt doing well  Ref to cardiology for furthe eval

## 2011-11-14 ENCOUNTER — Encounter: Payer: Self-pay | Admitting: Internal Medicine

## 2011-11-14 ENCOUNTER — Ambulatory Visit (INDEPENDENT_AMBULATORY_CARE_PROVIDER_SITE_OTHER): Payer: 59 | Admitting: Internal Medicine

## 2011-11-14 VITALS — BP 149/120 | HR 68 | Ht 68.0 in | Wt 186.0 lb

## 2011-11-14 DIAGNOSIS — Z950 Presence of cardiac pacemaker: Secondary | ICD-10-CM

## 2011-11-14 DIAGNOSIS — I442 Atrioventricular block, complete: Secondary | ICD-10-CM

## 2011-11-14 LAB — PACEMAKER DEVICE OBSERVATION
ATRIAL PACING PM: 12
VENTRICULAR PACING PM: 84

## 2011-11-14 NOTE — Patient Instructions (Addendum)
Your physician recommends that you schedule a follow-up appointment in: 3 months with Dr Ladona Ridgel in [place].  Your physician has requested that you have an echocardiogram. Echocardiography is a painless test that uses sound waves to create images of your heart. It provides your doctor with information about the size and shape of your heart and how well your heart's chambers and valves are working. This procedure takes approximately one hour. There are no restrictions for this procedure.

## 2011-11-18 ENCOUNTER — Other Ambulatory Visit: Payer: Self-pay | Admitting: Cardiology

## 2011-11-18 DIAGNOSIS — I442 Atrioventricular block, complete: Secondary | ICD-10-CM

## 2011-11-24 ENCOUNTER — Ambulatory Visit: Payer: 59 | Admitting: Internal Medicine

## 2011-11-24 ENCOUNTER — Other Ambulatory Visit: Payer: Self-pay

## 2011-11-24 ENCOUNTER — Other Ambulatory Visit (INDEPENDENT_AMBULATORY_CARE_PROVIDER_SITE_OTHER): Payer: 59

## 2011-11-24 ENCOUNTER — Encounter: Payer: Self-pay | Admitting: Internal Medicine

## 2011-11-24 DIAGNOSIS — I059 Rheumatic mitral valve disease, unspecified: Secondary | ICD-10-CM

## 2011-11-24 DIAGNOSIS — I442 Atrioventricular block, complete: Secondary | ICD-10-CM

## 2011-11-24 NOTE — Assessment & Plan Note (Signed)
Normal pacemaker function See Arita Miss Art report No changes today  We will obtain an echo to evaluate for any structural heart abnormalities and evaluate her EF at baseline with ventricular pacing.

## 2011-11-24 NOTE — Assessment & Plan Note (Signed)
As above Enroll in [place] and follow with remote monitoring  Return in 9 months

## 2011-11-24 NOTE — Progress Notes (Signed)
Roxy Manns, MD is PCP   Diana Cook is a 61 y.o. female with a h/o recent AV block sp PPM ([employer]) while on vacation in [place] who presents today to establish care in the Electrophysiology device clinic.  She presented to [facility] 10/24/11 with symptoms of chest pain.  She presented to the hospital in complete heart block with a ventricular escape in the 40s.  Per report, she underwent cath which revealed no significant CAD (I do not have this report).  She then had a PPM implanted. The patient reports doing very well since having a pacemaker implanted and remains very active.   Today, she  denies symptoms of palpitations, chest pain, shortness of breath, orthopnea, PND, lower extremity edema, dizziness, presyncope, syncope, or neurologic sequela.  The patientis tolerating medications without difficulties and is otherwise without complaint today.   Past Medical History  Diagnosis Date  . Asthma     as a child, mild now  . Seizure disorder   . Colon polyps     colonoscopy 7/08, tubular adenoma  . Tobacco abuse   . Allergic rhinitis   . Complete heart block 8/13    s/p PPM implanted in [place] [facility]   Past Surgical History  Procedure Date  . Cataract extraction   . Gyn surgery 1993    hysterectomy- form endometriosis  . Laparoscopy   . Breast biopsy 11/2005    benign inflammatory changes  . Small intestine surgery 12/2000    for blockage  . Pacemaker insertion 10/24/11    [employer] Advantio dual chamber PPM implanted by Dr Venda Rodes at [facility] in [place]    History   Social History  . Marital Status: Married    Spouse Name: N/A    Number of Children: N/A  . Years of Education: N/A   Occupational History  . Not on file.   Social History Main Topics  . Smoking status: Former Smoker -- 0.4 packs/day for 40 years    Types: Cigarettes    Quit date: 11/11/2010  . Smokeless tobacco: Never Used  . Alcohol Use:  Yes     Occasional wine  . Drug Use: No  . Sexually Active: Not on file   Other Topics Concern  . Not on file   Social History Narrative   Married 40 years.Exercises, does walking tapes    Family History  Problem Relation Age of Onset  . Stroke Mother   . Heart disease Mother     MI  . Dementia Mother   . Coronary artery disease Father   . Parkinsonism Father     Allergies  Allergen Reactions  . Codeine     REACTION: nausea  . Morphine And Related     Current Outpatient Prescriptions  Medication Sig Dispense Refill  . acetaminophen (TYLENOL) 500 MG tablet Take 500 mg by mouth every 6 (six) hours as needed.      Marland Kitchen albuterol (PROAIR HFA) 108 (90 BASE) MCG/ACT inhaler Inhale 2 puffs into the lungs every 4 (four) hours as needed.  1 Inhaler  5  . Calcium Carbonate-Vit D-Min (CALCIUM 600+D PLUS MINERALS) 600-400 MG-UNIT TABS Take 2 by mouth daily       . clindamycin (CLEOCIN T) 1 % lotion Apply to affected area once daily as needed for break outs       . cyclobenzaprine (FLEXERIL) 10 MG tablet as needed.      . fluticasone (FLONASE)  50 MCG/ACT nasal spray Place 2 sprays into the nose daily. 2 sprays each nostril daily as needed  16 g  11  . pantoprazole (PROTONIX) 40 MG tablet Take 40 mg by mouth daily.      . traMADol (ULTRAM) 50 MG tablet Take 50 mg by mouth every 6 (six) hours as needed.        ROS- all systems are reviewed and negative except as per HPI  Physical Exam: Filed Vitals:   11/14/11 1058  BP: 149/120  Pulse: 68  Height: 5\' 8"  (1.727 m)  Weight: 186 lb (84.369 kg)  SpO2: 100%    GEN- The patient is well appearing, alert and oriented x 3 today.   Head- normocephalic, atraumatic Eyes-  Sclera clear, conjunctiva pink Ears- hearing intact Oropharynx- clear Neck- supple, no JVP Lymph- no cervical lymphadenopathy Lungs- Clear to ausculation bilaterally, normal work of breathing Chest- pacemaker pocket is well healed Heart- Regular rate and rhythm, no  murmurs, rubs or gallops, PMI not laterally displaced GI- soft, NT, ND, + BS Extremities- no clubbing, cyanosis, or edema MS- no significant deformity or atrophy Skin- no rash or lesion Psych- euthymic mood, full affect Neuro- strength and sensation are intact  Pacemaker interrogation- reviewed in detail today,  See PACEART report  Assessment and Plan:

## 2012-02-04 ENCOUNTER — Encounter: Payer: Self-pay | Admitting: *Deleted

## 2012-02-10 ENCOUNTER — Encounter: Payer: Self-pay | Admitting: Internal Medicine

## 2012-02-10 ENCOUNTER — Ambulatory Visit (INDEPENDENT_AMBULATORY_CARE_PROVIDER_SITE_OTHER): Payer: 59 | Admitting: Internal Medicine

## 2012-02-10 VITALS — BP 120/60 | HR 80 | Ht 68.0 in | Wt 188.2 lb

## 2012-02-10 DIAGNOSIS — Z950 Presence of cardiac pacemaker: Secondary | ICD-10-CM

## 2012-02-10 DIAGNOSIS — I442 Atrioventricular block, complete: Secondary | ICD-10-CM

## 2012-02-10 LAB — PACEMAKER DEVICE OBSERVATION
ATRIAL PACING PM: 13
RV LEAD AMPLITUDE: 13.8 mv
RV LEAD THRESHOLD: 0.4 V
VENTRICULAR PACING PM: 16

## 2012-02-10 NOTE — Assessment & Plan Note (Signed)
Her [employer] dual-chamber pacemaker is working normally. We'll plan to recheck in several months. 

## 2012-02-10 NOTE — Progress Notes (Signed)
HPI Diana Cook returns today for followup. She is new to my clinic. She is a very pleasant 61 year old woman who developed complete heart block while on vacation in [place]. She underwent permanent pacemaker insertion at that time, approximately 3 months ago. In the interim, she has done well. Her only complaint is some dyspnea with exertion. No chest pain no chest pressure and no syncope. She denies peripheral edema. Allergies  Allergen Reactions  . Codeine     REACTION: nausea  . Morphine And Related      Current Outpatient Prescriptions  Medication Sig Dispense Refill  . acetaminophen (TYLENOL) 500 MG tablet Take 500 mg by mouth every 6 (six) hours as needed.      Marland Kitchen albuterol (PROAIR HFA) 108 (90 BASE) MCG/ACT inhaler Inhale 2 puffs into the lungs every 4 (four) hours as needed.  1 Inhaler  5  . Calcium Carbonate-Vit D-Min (CALCIUM 600+D PLUS MINERALS) 600-400 MG-UNIT TABS Take 2 by mouth daily       . clindamycin (CLEOCIN T) 1 % lotion Apply to affected area once daily as needed for break outs       . cyclobenzaprine (FLEXERIL) 10 MG tablet as needed.      . fluticasone (FLONASE) 50 MCG/ACT nasal spray Place 2 sprays into the nose daily. 2 sprays each nostril daily as needed  16 g  11  . pantoprazole (PROTONIX) 40 MG tablet Take 40 mg by mouth daily.      . traMADol (ULTRAM) 50 MG tablet Take 50 mg by mouth every 6 (six) hours as needed.         Past Medical History  Diagnosis Date  . Asthma     as a child, mild now  . Seizure disorder   . Colon polyps     colonoscopy 7/08, tubular adenoma  . Tobacco abuse   . Allergic rhinitis   . Complete heart block 8/13    s/p PPM implanted in [facility]    ROS:   All systems reviewed and negative except as noted in the HPI.   Past Surgical History  Procedure Date  . Cataract extraction   . Gyn surgery 1993    hysterectomy- form endometriosis  . Laparoscopy   . Breast biopsy 11/2005    benign inflammatory changes  .  Small intestine surgery 12/2000    for blockage  . Pacemaker insertion 10/24/11    [employer] Advantio dual chamber PPM implanted by Dr Venda Rodes at [facility] in [place]     Family History  Problem Relation Age of Onset  . Stroke Mother   . Heart disease Mother     MI  . Dementia Mother   . Coronary artery disease Father   . Parkinsonism Father      History   Social History  . Marital Status: Married    Spouse Name: N/A    Number of Children: N/A  . Years of Education: N/A   Occupational History  . Not on file.   Social History Main Topics  . Smoking status: Former Smoker -- 0.4 packs/day for 40 years    Types: Cigarettes    Quit date: 11/11/2010  . Smokeless tobacco: Never Used  . Alcohol Use: Yes     Comment: Occasional wine  . Drug Use: No  . Sexually Active: Not on file   Other Topics Concern  . Not on file   Social History Narrative   Married 40 years.Exercises,  does walking tapes     BP 120/60  Pulse 80  Ht 5\' 8"  (1.727 m)  Wt 188 lb 4 oz (85.39 kg)  BMI 28.62 kg/m2  Physical Exam:  Well appearing 60 year old woman, NAD HEENT: Unremarkable Neck:  7 cm JVD, no thyromegally Lungs:  Clear with no wheezes, rales, or rhonchi. HEART:  Regular rate rhythm, no murmurs, no rubs, no clicks Abd:  soft, positive bowel sounds, no organomegally, no rebound, no guarding Ext:  2 plus pulses, no edema, no cyanosis, no clubbing Skin:  No rashes no nodules Neuro:  CN II through XII intact, motor grossly intact  EKG normal sinus rhythm with left bundle branch block  DEVICE  Normal device function.  See PaceArt for details.   Assess/Plan:

## 2012-02-10 NOTE — Assessment & Plan Note (Signed)
Her AV conduction is improved. She is actually pacing less than a third of the time. We'll follow.

## 2012-02-10 NOTE — Patient Instructions (Addendum)
Your physician wants you to follow-up in: 6 months with device check and 1 year with Dr. Taylor. You will receive a reminder letter in the mail two months in advance. If you don't receive a letter, please call our office to schedule the follow-up appointment.  

## 2012-03-08 ENCOUNTER — Encounter: Payer: Self-pay | Admitting: Family Medicine

## 2012-03-08 ENCOUNTER — Ambulatory Visit (INDEPENDENT_AMBULATORY_CARE_PROVIDER_SITE_OTHER): Payer: 59 | Admitting: Family Medicine

## 2012-03-08 VITALS — BP 146/92 | HR 79 | Temp 98.6°F | Ht 68.0 in | Wt 187.8 lb

## 2012-03-08 DIAGNOSIS — J209 Acute bronchitis, unspecified: Secondary | ICD-10-CM

## 2012-03-08 MED ORDER — AMOXICILLIN-POT CLAVULANATE 875-125 MG PO TABS: 1.0000 | ORAL_TABLET | Freq: Two times a day (BID) | ORAL | Status: DC

## 2012-03-08 MED ORDER — ALBUTEROL SULFATE HFA 108 (90 BASE) MCG/ACT IN AERS: 2.0000 | INHALATION_SPRAY | RESPIRATORY_TRACT | Status: DC | PRN

## 2012-03-08 NOTE — Assessment & Plan Note (Signed)
In former smoker with hx of childhood asthma who has run out of albuterol  Did refil her albuterol hfa inhaler  If not helpful- will need to call in a pred taper  In light of length of illness- will cover with augmentin She will try mucinex DM for cough Update if not starting to improve in a week or if worsening  - esp if wheezing worsens or does not improve

## 2012-03-08 NOTE — Patient Instructions (Addendum)
Use inhaler up to 2 puffs every 4 hours for wheezing and shortness of breath If this does not help- call and we will put you on some prednisone  augmentin for bronchitis  Try mucinex DM over the counter for cough Rest and fluids Update if not starting to improve in a week or if worsening

## 2012-03-08 NOTE — Progress Notes (Signed)
Subjective:    Patient ID: Diana Cook, female    DOB: [birth year], 62 y.o.   MRN: [id number]  HPI Here with uri symptoms  Has been sick for "a good while"- since before xmas  (her husband had surg- has been too busy to come in)  Her pacemaker check was good   Having trouble with shortness of breath - and also wheezing  Productive cough- yellow sputum  Chest is sore from cough Hx of asthma and used to smoke   Throat is raw from cough Ears are ok  Thought she had a swollen node on neck -better now Stuffy nose   Taking benadryl at night  Nose is dry or runny back and forth nyquil for cough also  No fever   Patient Active Problem List  Diagnosis  . Former smoker  . EUSTACHIAN TUBE DYSFUNCTION  . ASTHMA  . HIDRADENITIS SUPPURATIVA  . SEBACEOUS CYST  . SACROILIITIS, LEFT  . HERNIATED DISC  . BACK, LOWER, PAIN  . SCIATICA, LEFT  . SEIZURE DISORDER  . INSOMNIA  . HX, PERSONAL, COLONIC POLYPS  . POSTMENOPAUSAL STATUS  . Fibrocystic breast disease  . Other screening mammogram  . Routine general medical examination at a health care facility  . Lumbar spinal stenosis  . Complete heart block  . Pacemaker-[employer]   Past Medical History  Diagnosis Date  . Asthma     as a child, mild now  . Seizure disorder   . Colon polyps     colonoscopy 7/08, tubular adenoma  . Tobacco abuse   . Allergic rhinitis   . Complete heart block 8/13    s/p PPM implanted in [place] [facility]   Past Surgical History  Procedure Date  . Cataract extraction   . Gyn surgery 1993    hysterectomy- form endometriosis  . Laparoscopy   . Breast biopsy 11/2005    benign inflammatory changes  . Small intestine surgery 12/2000    for blockage  . Pacemaker insertion 10/24/11    [employer] Advantio dual chamber PPM implanted by Dr Venda Rodes at [facility] in [place]   History  Substance Use Topics  . Smoking status: Former Smoker -- 0.4 packs/day for 40 years      Types: Cigarettes    Quit date: 11/11/2010  . Smokeless tobacco: Never Used  . Alcohol Use: Yes     Comment: Occasional wine   Family History  Problem Relation Age of Onset  . Stroke Mother   . Heart disease Mother     MI  . Dementia Mother   . Coronary artery disease Father   . Parkinsonism Father    Allergies  Allergen Reactions  . Codeine     REACTION: nausea  . Morphine And Related    Current Outpatient Prescriptions on File Prior to Visit  Medication Sig Dispense Refill  . acetaminophen (TYLENOL) 500 MG tablet Take 500 mg by mouth every 6 (six) hours as needed.      . Calcium Carbonate-Vit D-Min (CALCIUM 600+D PLUS MINERALS) 600-400 MG-UNIT TABS Take 2 by mouth daily       . clindamycin (CLEOCIN T) 1 % lotion Apply to affected area once daily as needed for break outs       . albuterol (PROAIR HFA) 108 (90 BASE) MCG/ACT inhaler Inhale 2 puffs into the lungs every 4 (four) hours as needed.  1 Inhaler  5  . fluticasone (FLONASE) 50 MCG/ACT nasal spray Place 2  sprays into the nose daily. 2 sprays each nostril daily as needed  16 g  11      Review of Systems Review of Systems  Constitutional: Negative for fever, appetite change,  and unexpected weight change.  ENt pos for rhinorrhea and drip/neg for sinus pain  Eyes: Negative for pain and visual disturbance.  Respiratory: Negative for hemoptysis and pos for cough/ wheeze/ sob on exertion   Cardiovascular: Negative for cp or palpitations    Gastrointestinal: Negative for nausea, diarrhea and constipation.  Genitourinary: Negative for urgency and frequency.  Skin: Negative for pallor or rash   Neurological: Negative for weakness, light-headedness, numbness and headaches.  Hematological: Negative for adenopathy. Does not bruise/bleed easily.  Psychiatric/Behavioral: Negative for dysphoric mood. The patient is not nervous/anxious.         Objective:   Physical Exam  Constitutional: She appears well-developed and  well-nourished. No distress.  HENT:  Head: Normocephalic and atraumatic.  Right Ear: External ear normal.  Left Ear: External ear normal.  Mouth/Throat: Oropharynx is clear and moist. No oropharyngeal exudate.       Nares are injected and congested   No sinus tenderness Clear rhinorrhea  Eyes: Conjunctivae normal and EOM are normal. Pupils are equal, round, and reactive to light. Right eye exhibits no discharge. Left eye exhibits no discharge.  Neck: Normal range of motion. Neck supple. No tracheal deviation present.  Cardiovascular: Normal rate and regular rhythm.   Pulmonary/Chest: Effort normal. No respiratory distress. She has wheezes. She has no rales. She exhibits no tenderness.       Diffusely distant bs  Few isolated rhonchi Mild exp wheeze with fair air exch No rales  No labored breathing   Musculoskeletal: She exhibits no edema.  Lymphadenopathy:    She has no cervical adenopathy.  Neurological: She is alert. She has normal reflexes.  Skin: Skin is warm and dry. No rash noted.  Psychiatric: She has a normal mood and affect.          Assessment & Plan:

## 2012-04-01 ENCOUNTER — Telehealth: Payer: Self-pay

## 2012-04-01 MED ORDER — PREDNISONE 10 MG PO TABS: ORAL_TABLET | ORAL | Status: DC

## 2012-04-01 NOTE — Telephone Encounter (Signed)
pred taper sent to her pharmacy- let her know  F/u if no imp

## 2012-04-01 NOTE — Telephone Encounter (Signed)
Patient notified as instructed by telephone. 

## 2012-04-01 NOTE — Telephone Encounter (Signed)
Pt left v/m; seen 03/08/12 and condition same still coughing; pt request prednisone discussed at appt [facility].Please advise.

## 2012-05-18 ENCOUNTER — Encounter: Payer: Self-pay | Admitting: Family Medicine

## 2012-05-18 ENCOUNTER — Ambulatory Visit (INDEPENDENT_AMBULATORY_CARE_PROVIDER_SITE_OTHER): Payer: 59 | Admitting: Family Medicine

## 2012-05-18 VITALS — BP 122/86 | HR 69 | Temp 97.6°F

## 2012-05-18 DIAGNOSIS — L732 Hidradenitis suppurativa: Secondary | ICD-10-CM

## 2012-05-18 NOTE — Progress Notes (Signed)
Subjective:    Patient ID: Diana Cook, female    DOB: 10/02/1950, 62 y.o.   MRN: [id number]  HPI Here for several concerns    Is having more problems with hip and leg pain especially after inactivity  Takes tylenol for this    Has recurrent hydraadenitis  This flares on and off  Uses cleocin lotion  Also zeosorb powder Has used iodine to cleanse area  Takes antibiotics intermittently   (usually doxycycline) -  Has seen Dr Orson Aloe in the past - he is going to retire in April   Today's break out is in thigh area primarily  No fever  None on vaginal or rectal area   Looked up emuaid on line - ? What it is - unable to find in the database  Patient Active Problem List  Diagnosis  . Former smoker  . EUSTACHIAN TUBE DYSFUNCTION  . ASTHMA  . HIDRADENITIS SUPPURATIVA  . SEBACEOUS CYST  . SACROILIITIS, LEFT  . HERNIATED DISC  . BACK, LOWER, PAIN  . SCIATICA, LEFT  . SEIZURE DISORDER  . INSOMNIA  . HX, PERSONAL, COLONIC POLYPS  . POSTMENOPAUSAL STATUS  . Fibrocystic breast disease  . Other screening mammogram  . Routine general medical examination at a health care facility  . Lumbar spinal stenosis  . Complete heart block  . [employer] investment banker  . Bronchitis with bronchospasm   Past Medical History  Diagnosis Date  . Asthma     as a child, mild now  . Seizure disorder   . Colon polyps     colonoscopy 7/08, tubular adenoma  . Tobacco abuse   . Allergic rhinitis   . Complete heart block 8/13    s/p PPM implanted in [facility]   Past Surgical History  Procedure Laterality Date  . Cataract extraction    . Gyn surgery  1993    hysterectomy- form endometriosis  . Laparoscopy    . Breast biopsy  11/2005    benign inflammatory changes  . Small intestine surgery  12/2000    for blockage  . Pacemaker insertion  10/24/11    [employer] Advantio dual chamber PPM implanted by Dr Venda Rodes at [facility] in [place]   History   Substance Use Topics  . Smoking status: Former Smoker -- 0.40 packs/day for 40 years    Types: Cigarettes    Quit date: 11/11/2010  . Smokeless tobacco: Never Used  . Alcohol Use: Yes     Comment: Occasional wine   Family History  Problem Relation Age of Onset  . Stroke Mother   . Heart disease Mother     MI  . Dementia Mother   . Coronary artery disease Father   . Parkinsonism Father    Allergies  Allergen Reactions  . Codeine     REACTION: nausea  . Morphine And Related    Current Outpatient Prescriptions on File Prior to Visit  Medication Sig Dispense Refill  . acetaminophen (TYLENOL) 500 MG tablet Take 500 mg by mouth every 6 (six) hours as needed.      Marland Kitchen albuterol (PROAIR HFA) 108 (90 BASE) MCG/ACT inhaler Inhale 2 puffs into the lungs every 4 (four) hours as needed for wheezing or shortness of breath.  1 Inhaler  5  . clindamycin (CLEOCIN T) 1 % lotion Apply to affected area once daily as needed for break outs       . Calcium Carbonate-Vit D-Min (CALCIUM 600+D PLUS MINERALS) 600-400  MG-UNIT TABS Take 2 by mouth daily        No current facility-administered medications on file prior to visit.     Review of Systems Review of Systems  Constitutional: Negative for fever, appetite change, fatigue and unexpected weight change.  Eyes: Negative for pain and visual disturbance.  Respiratory: Negative for cough and shortness of breath.   Cardiovascular: Negative for cp or palpitations    Gastrointestinal: Negative for nausea, diarrhea and constipation.  Genitourinary: Negative for urgency and frequency.  Skin: Negative for pallor or rash  pos for cysts from hydraadenitis , neg for drainage  Neurological: Negative for weakness, light-headedness, numbness and headaches.  Hematological: Negative for adenopathy. Does not bruise/bleed easily.  Psychiatric/Behavioral: Negative for dysphoric mood. The patient is not nervous/anxious.         Objective:   Physical Exam   Constitutional: She appears well-developed and well-nourished. No distress.  HENT:  Head: Normocephalic and atraumatic.  Eyes: Conjunctivae and EOM are normal. Pupils are equal, round, and reactive to light.  Neck: Normal range of motion. Neck supple.  Cardiovascular: Normal rate and regular rhythm.   Pulmonary/Chest: Effort normal and breath sounds normal.  Lymphadenopathy:    She has no cervical adenopathy.  Neurological: She is alert.  Skin: Skin is warm and dry. There is erythema. No pallor.  Many erythematous firm lumps scattered among scars in groin area bilaterally No drainage or fluctuant areas Diffuse erythema No other skin abnormalities  Psychiatric: She has a normal mood and affect.          Assessment & Plan:

## 2012-05-18 NOTE — Patient Instructions (Addendum)
Keep areas of hydraadenitis very clean - with antibacterial soap and water  Keep using your powder  Use the doxycycline when needed - and if that fails to work please let me know (take 14 day course of it twice daily)  You can use warm compresses on large or draining areas Do not shave in the involved area

## 2012-05-18 NOTE — Assessment & Plan Note (Addendum)
With recurrent cysts (no boils today) in groin  See avs for adv Will take 14 d course of doxy Disc prevention  Will continue doxy prn (cleocin not working)  After visit her old records from derm arrived - looks like in the past she was enc to cleanse aff area with betadyne, and has been on other abx incl keflex and augmentin Also looks like she had a trial of accutane in the past

## 2012-06-07 ENCOUNTER — Ambulatory Visit: Payer: 59 | Admitting: Family Medicine

## 2012-06-09 ENCOUNTER — Ambulatory Visit (INDEPENDENT_AMBULATORY_CARE_PROVIDER_SITE_OTHER): Payer: 59 | Admitting: Family Medicine

## 2012-06-09 ENCOUNTER — Encounter: Payer: Self-pay | Admitting: Family Medicine

## 2012-06-09 VITALS — BP 136/80 | HR 78 | Temp 97.9°F | Ht 68.0 in | Wt 191.5 lb

## 2012-06-09 DIAGNOSIS — M461 Sacroiliitis, not elsewhere classified: Secondary | ICD-10-CM

## 2012-06-09 DIAGNOSIS — M545 Low back pain: Secondary | ICD-10-CM

## 2012-06-09 MED ORDER — PREDNISONE 10 MG PO TABS: ORAL_TABLET | ORAL | Status: DC

## 2012-06-09 NOTE — Patient Instructions (Addendum)
Continue heat for 10 minutes at a time  Use muscle relaxer if it helps  Take the prednisone as directed Try to walk (gently) - do not heavy lift and do not sit or lie for a prolonged period of time  If not improved or worse let me know

## 2012-06-09 NOTE — Progress Notes (Signed)
Subjective:    Patient ID: Diana Cook, female    DOB: March 07, 1950, 62 y.o.   MRN: [id number]  HPI Having more back problems - low back pain gradually getting worse  Started on a muscle relaxer and that only helped for a little while -not not effective Tylenol not effective  Most painful spot in on L side  It radiates to her L leg   Not as bad as 2009   Pain level on scale of 1-10 is about to 7 Hurts worst when initiating gait after sitting , used to ease off after that and not now  Pain was so bad yesterday she did not get out of bed Low back felt "numb" yesterday-that is improved  No problem lifting toes or foot and no ext numbness Still throbs even when in bed   Patient Active Problem List  Diagnosis  . Former smoker  . EUSTACHIAN TUBE DYSFUNCTION  . ASTHMA  . HIDRADENITIS SUPPURATIVA  . SEBACEOUS CYST  . SACROILIITIS, LEFT  . HERNIATED DISC  . BACK, LOWER, PAIN  . SCIATICA, LEFT  . SEIZURE DISORDER  . INSOMNIA  . HX, PERSONAL, COLONIC POLYPS  . POSTMENOPAUSAL STATUS  . Fibrocystic breast disease  . Other screening mammogram  . Routine general medical examination at a health care facility  . Lumbar spinal stenosis  . Complete heart block  . [employer] investment banker  . Bronchitis with bronchospasm   Past Medical History  Diagnosis Date  . Asthma     as a child, mild now  . Seizure disorder   . Colon polyps     colonoscopy 7/08, tubular adenoma  . Tobacco abuse   . Allergic rhinitis   . Complete heart block 8/13    s/p PPM [facility] in [facility]   Past Surgical History  Procedure Laterality Date  . Cataract extraction    . Gyn surgery  1993    hysterectomy- form endometriosis  . Laparoscopy    . Breast biopsy  11/2005    benign inflammatory changes  . Small intestine surgery  12/2000    for blockage  . Pacemaker insertion  10/24/11    [employer] Advantio dual chamber PPM implanted by Dr Venda Rodes at [facility] in  [place]   History  Substance Use Topics  . Smoking status: Former Smoker -- 0.40 packs/day for 40 years    Types: Cigarettes    Quit date: 11/11/2010  . Smokeless tobacco: Never Used  . Alcohol Use: Yes     Comment: Occasional wine   Family History  Problem Relation Age of Onset  . Stroke Mother   . Heart disease Mother     MI  . Dementia Mother   . Coronary artery disease Father   . Parkinsonism Father    Allergies  Allergen Reactions  . Codeine     REACTION: nausea  . Morphine And Related    Current Outpatient Prescriptions on File Prior to Visit  Medication Sig Dispense Refill  . acetaminophen (TYLENOL) 500 MG tablet Take 500 mg by mouth every 6 (six) hours as needed.      Marland Kitchen albuterol (PROAIR HFA) 108 (90 BASE) MCG/ACT inhaler Inhale 2 puffs into the lungs every 4 (four) hours as needed for wheezing or shortness of breath.  1 Inhaler  5  . Calcium Carbonate-Vit D-Min (CALCIUM 600+D PLUS MINERALS) 600-400 MG-UNIT TABS Take 2 by mouth daily       . clindamycin (CLEOCIN T) 1 %  lotion Apply to affected area once daily as needed for break outs       . doxycycline (DORYX) 100 MG DR capsule Take 100 mg by mouth 2 (two) times daily. For 14 days prn for flare of hydraadenitis       No current facility-administered medications on file prior to visit.     Review of Systems Review of Systems  Constitutional: Negative for fever, appetite change, fatigue and unexpected weight change.  Eyes: Negative for pain and visual disturbance.  Respiratory: Negative for cough and shortness of breath.   Cardiovascular: Negative for cp or palpitations    Gastrointestinal: Negative for nausea, diarrhea and constipation.  Genitourinary: Negative for urgency and frequency.  Skin: Negative for pallor or rash   MSK pos for low back pain rad to left leg, neg for swollen or tender joints  Neurological: Negative for weakness, light-headedness, and headaches.  Hematological: Negative for  adenopathy. Does not bruise/bleed easily.  Psychiatric/Behavioral: Negative for dysphoric mood. The patient is not nervous/anxious.         Objective:   Physical Exam  Constitutional: She appears well-developed and well-nourished. No distress.  HENT:  Head: Normocephalic and atraumatic.  Eyes: Conjunctivae and EOM are normal. Pupils are equal, round, and reactive to light.  Neck: Normal range of motion. Neck supple.  Cardiovascular: Normal rate and regular rhythm.   Pulmonary/Chest: Effort normal and breath sounds normal.  Abdominal: There is no CVA tenderness.  Musculoskeletal: She exhibits tenderness. She exhibits no edema.       Lumbar back: She exhibits decreased range of motion, tenderness and spasm. She exhibits no bony tenderness, no swelling and no edema.  Tender over L SI joint with some spasm  Pain on ext rot of hip Neg SLR LS flex 30 deg and ext 20 deg Pain to lat flex L  Lymphadenopathy:    She has no cervical adenopathy.  Neurological: She is alert. She has normal reflexes. She displays no atrophy. No cranial nerve deficit or sensory deficit. She exhibits normal muscle tone. Coordination and gait normal.  Skin: Skin is warm and dry. No rash noted.  Psychiatric: She has a normal mood and affect.          Assessment & Plan:

## 2012-06-09 NOTE — Assessment & Plan Note (Signed)
Pt has facet joint and disc dz lumbar as well as sacroiliitis -seems to be acting up  No new neurol s/s Will try heat and a taper of prednisone  Disc gentle walking as well as use of muscle relaxer if helpful- see AVS If no imp may need ortho attn or another MRI

## 2012-06-10 NOTE — Assessment & Plan Note (Signed)
Acute on chronic and tender  Disc use of heat and gentle walking  pred taper given -will update if not improved

## 2012-08-11 ENCOUNTER — Encounter: Payer: Self-pay | Admitting: Internal Medicine

## 2012-08-11 ENCOUNTER — Ambulatory Visit (INDEPENDENT_AMBULATORY_CARE_PROVIDER_SITE_OTHER): Payer: 59 | Admitting: *Deleted

## 2012-08-11 DIAGNOSIS — I442 Atrioventricular block, complete: Secondary | ICD-10-CM

## 2012-08-11 LAB — PACEMAKER DEVICE OBSERVATION
AL AMPLITUDE: 7.9 mv
AL IMPEDENCE PM: 500 Ohm
AL THRESHOLD: 0.5 V
ATRIAL PACING PM: 9
DEVICE MODEL PM: 134132
RV LEAD IMPEDENCE PM: 525 Ohm

## 2012-08-11 NOTE — Progress Notes (Signed)
PPM check in clinic 

## 2012-11-04 ENCOUNTER — Telehealth: Payer: Self-pay | Admitting: *Deleted

## 2012-11-04 NOTE — Telephone Encounter (Signed)
The patient walked in the office today with complaints of right foot and ankle edema. She notices this is worse as the day goes on. She relates this to starting the last time her device was interrogated in June 2014. The day her device was interrogated, she left directly for the beach. Discussed with Gunnar Fusi in the device clinic if the changes made at that time would contribute to swelling. Per Gunnar Fusi, this would not contribute. The patient feels this is PPM. Edema is not visible today. Discussed with Dr. Mariah Milling, per MD, patient instructed to use support stockings and keep her feet elevated. Patient agreeable.

## 2012-11-16 ENCOUNTER — Telehealth: Payer: Self-pay | Admitting: Internal Medicine

## 2012-11-16 NOTE — Telephone Encounter (Signed)
Call received from the patient. She reports that she was at the beach this weekend and had intermittent chest pain that felt like heartburn. She states that she had two bloody mary's on Friday and Saturday. The pain was mid-sternal, but not like when she had a previous MI. She is currently on a prednisone taper due to some back issues that she is having. When her discomfort occurred, she had been on this for about 4 days. She is feeling fine now. Last noted discomfort was yesterday coming home from the beach, but not as bad as the weekend. I advised the patient that I am not certain if her symptoms may be due to the prednisone she is taking. She has been instructed to monitor symptoms since they have improved. She will call back with recurrent or worsening symptoms. She is agreeable.

## 2012-11-30 ENCOUNTER — Telehealth: Payer: Self-pay

## 2012-11-30 MED ORDER — DOXYCYCLINE HYCLATE 100 MG PO TABS: 100.0000 mg | ORAL_TABLET | Freq: Two times a day (BID) | ORAL | Status: DC

## 2012-11-30 NOTE — Telephone Encounter (Signed)
Pt said hidradenitis flare up on perineal area and groin area. Pt said Dr Milinda Antis has been following. Pt request antibiotic to [facility].Please advise.

## 2012-11-30 NOTE — Telephone Encounter (Signed)
Pt notified Rx sent to pharmacy and to f/u if no improvement  

## 2012-11-30 NOTE — Telephone Encounter (Signed)
I think doxycycline works best for her I will send that in Tell her to flu if worse or no imp

## 2012-12-03 ENCOUNTER — Ambulatory Visit (INDEPENDENT_AMBULATORY_CARE_PROVIDER_SITE_OTHER): Payer: 59

## 2012-12-03 DIAGNOSIS — Z23 Encounter for immunization: Secondary | ICD-10-CM

## 2012-12-17 ENCOUNTER — Ambulatory Visit: Payer: Self-pay | Admitting: Physical Medicine and Rehabilitation

## 2013-02-10 ENCOUNTER — Ambulatory Visit (INDEPENDENT_AMBULATORY_CARE_PROVIDER_SITE_OTHER): Payer: 59 | Admitting: Internal Medicine

## 2013-02-10 ENCOUNTER — Encounter: Payer: Self-pay | Admitting: Internal Medicine

## 2013-02-10 VITALS — BP 110/58 | HR 82 | Ht 69.0 in | Wt 196.5 lb

## 2013-02-10 DIAGNOSIS — I442 Atrioventricular block, complete: Secondary | ICD-10-CM

## 2013-02-10 DIAGNOSIS — R0602 Shortness of breath: Secondary | ICD-10-CM | POA: Insufficient documentation

## 2013-02-10 DIAGNOSIS — Z950 Presence of cardiac pacemaker: Secondary | ICD-10-CM

## 2013-02-10 LAB — MDC_IDC_ENUM_SESS_TYPE_INCLINIC
Battery Remaining Longevity: 72 mo
Brady Statistic RA Percent Paced: 12 %
Date Time Interrogation Session: 20141218050000
Lead Channel Impedance Value: 426 Ohm
Lead Channel Pacing Threshold Amplitude: 0.4 V
Lead Channel Setting Pacing Amplitude: 2 V
Lead Channel Setting Pacing Amplitude: 2.4 V
Lead Channel Setting Pacing Pulse Width: 0.5 ms

## 2013-02-10 MED ORDER — FUROSEMIDE 20 MG PO TABS: 20.0000 mg | ORAL_TABLET | ORAL | Status: DC

## 2013-02-10 NOTE — Assessment & Plan Note (Signed)
The patient has dyspnea on exertion which is relatively new in the context of peripheral edema. According to her medical history she has significant lung disease as well as the one issue may be that. The cardiac concern is whether there has been the impact on cardiac performance related to right ventricular pacing. We will plan to try her on low-dose diuretic regime 20 mg every other day for a week. Will also get a 2-D echo to look at left ventricular function. We'll see her again in 3 no

## 2013-02-10 NOTE — Progress Notes (Signed)
Patient ID: Diana Cook, female   DOB: 07-04-1950, 62 y.o.   MRN: [id number]      Patient Care Team: Judy Pimple, MD as PCP - General   HPI  Diana Cook is a 62 y.o. female Seen by both Drs. Allred and Ladona Ridgel in the past who developed complete heart block and underwent pacing fall 2013.  Catheterization reportedly demonstrated no coronary artery disease Echocardiogram 9/13 demonstrated normal left ventricular  Over recent weeks and months she has developed dyspnea on exertion notable when climbing up from her mailbox. This has been accompanied by peripheral edema.  Past Medical History  Diagnosis Date  . Asthma     as a child, mild now  . Seizure disorder   . Colon polyps     colonoscopy 7/08, tubular adenoma  . Tobacco abuse   . Allergic rhinitis   . Complete heart block 8/13    s/p PPM implanted in [facility]    Past Surgical History  Procedure Laterality Date  . Cataract extraction    . Gyn surgery  1993    hysterectomy- form endometriosis  . Laparoscopy    . Breast biopsy  11/2005    benign inflammatory changes  . Small intestine surgery  12/2000    for blockage  . Pacemaker insertion  10/24/11    [employer] Advantio dual chamber PPM implanted by Dr Venda Rodes at [facility]    Current Outpatient Prescriptions  Medication Sig Dispense Refill  . acetaminophen (TYLENOL) 500 MG tablet Take 500 mg by mouth every 6 (six) hours as needed.      Marland Kitchen albuterol (PROAIR HFA) 108 (90 BASE) MCG/ACT inhaler Inhale 2 puffs into the lungs every 4 (four) hours as needed for wheezing or shortness of breath.  1 Inhaler  5  . Calcium Carbonate-Vit D-Min (CALCIUM 600+D PLUS MINERALS) 600-400 MG-UNIT TABS Take 2 by mouth daily       . clindamycin (CLEOCIN T) 1 % lotion Apply to affected area once daily as needed for break outs       . cyclobenzaprine (FLEXERIL) 10 MG tablet Take 10 mg by mouth 3 (three) times daily as needed for muscle  spasms.      Marland Kitchen doxycycline (DORYX) 100 MG DR capsule Take 100 mg by mouth 2 (two) times daily. For 14 days prn for flare of hydraadenitis      . doxycycline (VIBRA-TABS) 100 MG tablet Take 1 tablet (100 mg total) by mouth 2 (two) times daily.  20 tablet  0   No current facility-administered medications for this visit.    Allergies  Allergen Reactions  . Codeine     REACTION: nausea  . Morphine And Related     Review of Systems negative except from HPI and PMH  Physical Exam BP 110/58  Pulse 82  Ht 5\' 9"  (1.753 m)  Wt 196 lb 8 oz (89.132 kg)  BMI 29.00 kg/m2 Well developed and well nourished in no acute distress HENT normal E scleral and icterus clear Neck Supple JVP 6-7; carotids brisk and full Clear to ausculation Regular rate and rhythm, no murmurs gallops or rub Soft with active bowel sounds No clubbing cyanosis Trace Edema Alert and oriented, grossly normal motor and sensory function Skin Warm and Dry  ECG demonstrates P. Synchronous pacing  Assessment and  Plan

## 2013-02-10 NOTE — Assessment & Plan Note (Signed)
The patient's device was interrogated.  The information was reviewed. No changes were made in the programming.    

## 2013-02-10 NOTE — Patient Instructions (Signed)
Your physician has recommended you make the following change in your medication: Start Lasix 20mg  every other day for 7 days.  Your physician has requested that you have an echocardiogram. Echocardiography is a painless test that uses sound waves to create images of your heart. It provides your doctor with information about the size and shape of your heart and how well your heart's chambers and valves are working. This procedure takes approximately one hour. There are no restrictions for this procedure.  Your physician recommends that you schedule a follow-up appointment in: 3 months.

## 2013-02-15 ENCOUNTER — Other Ambulatory Visit: Payer: Self-pay

## 2013-02-15 ENCOUNTER — Other Ambulatory Visit (INDEPENDENT_AMBULATORY_CARE_PROVIDER_SITE_OTHER): Payer: 59

## 2013-02-15 DIAGNOSIS — I4891 Unspecified atrial fibrillation: Secondary | ICD-10-CM

## 2013-02-15 DIAGNOSIS — Z950 Presence of cardiac pacemaker: Secondary | ICD-10-CM

## 2013-02-15 DIAGNOSIS — I442 Atrioventricular block, complete: Secondary | ICD-10-CM

## 2013-02-15 DIAGNOSIS — R0602 Shortness of breath: Secondary | ICD-10-CM

## 2013-03-25 ENCOUNTER — Telehealth: Payer: Self-pay | Admitting: Family Medicine

## 2013-03-25 MED ORDER — DOXYCYCLINE HYCLATE 100 MG PO CPEP: 100.0000 mg | ORAL_CAPSULE | Freq: Two times a day (BID) | ORAL | Status: DC

## 2013-03-25 NOTE — Telephone Encounter (Signed)
Pt left msg on triage phone saying that she is having a flare of hidradentitis.  She usually gets an abx but does not remember the name.  She is requesting this be sent in for her.

## 2013-03-25 NOTE — Telephone Encounter (Signed)
I think doxycycline works best for her - will send this to her pharmacy- f/u if no imp

## 2013-03-25 NOTE — Telephone Encounter (Signed)
Pt notified that Rx was sent to pharmacy and to contact our office if no improvement.

## 2013-04-01 ENCOUNTER — Inpatient Hospital Stay: Payer: Self-pay | Admitting: Surgery

## 2013-04-01 LAB — BASIC METABOLIC PANEL
Anion Gap: 3 — ABNORMAL LOW (ref 7–16)
BUN: 20 mg/dL — AB (ref 7–18)
CHLORIDE: 107 mmol/L (ref 98–107)
CO2: 26 mmol/L (ref 21–32)
Calcium, Total: 9.6 mg/dL (ref 8.5–10.1)
Creatinine: 0.71 mg/dL (ref 0.60–1.30)
EGFR (African American): >60
Glucose: 112 mg/dL — ABNORMAL HIGH (ref 65–99)
OSMOLALITY: 275 (ref 275–301)
POTASSIUM: 4.4 mmol/L (ref 3.5–5.1)
SODIUM: 136 mmol/L (ref 136–145)

## 2013-04-01 LAB — CBC WITH DIFFERENTIAL/PLATELET
BASOS ABS: 0 10*3/uL (ref 0.0–0.1)
Basophil %: 0.4 %
EOS PCT: 0.4 %
Eosinophil #: 0 10*3/uL (ref 0.0–0.7)
HCT: 39.2 % (ref 35.0–47.0)
HGB: 13.3 g/dL (ref 12.0–16.0)
LYMPHS ABS: 1.3 10*3/uL (ref 1.0–3.6)
Lymphocyte %: 14.9 %
MCH: 29.8 pg (ref 26.0–34.0)
MCHC: 34 g/dL (ref 32.0–36.0)
MCV: 88 fL (ref 80–100)
MONO ABS: 0.7 x10 3/mm (ref 0.2–0.9)
Monocyte %: 8.5 %
NEUTROS ABS: 6.6 10*3/uL — AB (ref 1.4–6.5)
Neutrophil %: 75.8 %
Platelet: 278 10*3/uL (ref 150–440)
RBC: 4.46 10*6/uL (ref 3.80–5.20)
RDW: 14.1 % (ref 11.5–14.5)
WBC: 8.7 10*3/uL (ref 3.6–11.0)

## 2013-04-01 LAB — COMPREHENSIVE METABOLIC PANEL
ALK PHOS: 116 U/L
Albumin: 4.2 g/dL (ref 3.4–5.0)
Anion Gap: 3 — ABNORMAL LOW (ref 7–16)
BUN: 30 mg/dL — AB (ref 7–18)
Bilirubin,Total: 0.4 mg/dL (ref 0.2–1.0)
CALCIUM: 10.6 mg/dL — AB (ref 8.5–10.1)
Chloride: 102 mmol/L (ref 98–107)
Co2: 29 mmol/L (ref 21–32)
Creatinine: 0.92 mg/dL (ref 0.60–1.30)
EGFR (African American): >60
Glucose: 104 mg/dL — ABNORMAL HIGH (ref 65–99)
OSMOLALITY: 275 (ref 275–301)
Potassium: 4 mmol/L (ref 3.5–5.1)
SGOT(AST): 32 U/L (ref 15–37)
SGPT (ALT): 27 U/L (ref 12–78)
Sodium: 134 mmol/L — ABNORMAL LOW (ref 136–145)
Total Protein: 8.5 g/dL — ABNORMAL HIGH (ref 6.4–8.2)

## 2013-04-01 LAB — URINALYSIS, COMPLETE
Bacteria: NONE SEEN
Bilirubin,UR: NEGATIVE
Blood: NEGATIVE
Glucose,UR: NEGATIVE mg/dL (ref 0–75)
Ketone: NEGATIVE
LEUKOCYTE ESTERASE: NEGATIVE
Nitrite: NEGATIVE
PROTEIN: NEGATIVE
Ph: 5 (ref 4.5–8.0)
RBC,UR: 1 /HPF (ref 0–5)
Squamous Epithelial: 1
WBC UR: <1 /HPF (ref 0–5)

## 2013-04-01 LAB — CBC
HCT: 44.3 % (ref 35.0–47.0)
HGB: 14.9 g/dL (ref 12.0–16.0)
MCH: 29.6 pg (ref 26.0–34.0)
MCHC: 33.7 g/dL (ref 32.0–36.0)
MCV: 88 fL (ref 80–100)
PLATELETS: 284 10*3/uL (ref 150–440)
RBC: 5.04 10*6/uL (ref 3.80–5.20)
RDW: 14 % (ref 11.5–14.5)
WBC: 10.6 10*3/uL (ref 3.6–11.0)

## 2013-04-01 LAB — LIPASE, BLOOD: LIPASE: 91 U/L (ref 73–393)

## 2013-04-02 LAB — BASIC METABOLIC PANEL
Anion Gap: 1 — ABNORMAL LOW (ref 7–16)
BUN: 13 mg/dL (ref 7–18)
Calcium, Total: 9 mg/dL (ref 8.5–10.1)
Chloride: 106 mmol/L (ref 98–107)
Co2: 30 mmol/L (ref 21–32)
Creatinine: 0.8 mg/dL (ref 0.60–1.30)
EGFR (Non-African Amer.): >60
GLUCOSE: 99 mg/dL (ref 65–99)
Osmolality: 274 (ref 275–301)
Potassium: 4.5 mmol/L (ref 3.5–5.1)
Sodium: 137 mmol/L (ref 136–145)

## 2013-04-02 LAB — CBC WITH DIFFERENTIAL/PLATELET
BASOS PCT: 0.4 %
Basophil #: 0 10*3/uL (ref 0.0–0.1)
Eosinophil #: 0.1 10*3/uL (ref 0.0–0.7)
Eosinophil %: 2.2 %
HCT: 37.2 % (ref 35.0–47.0)
HGB: 12.8 g/dL (ref 12.0–16.0)
LYMPHS PCT: 23.6 %
Lymphocyte #: 1.5 10*3/uL (ref 1.0–3.6)
MCH: 30.6 pg (ref 26.0–34.0)
MCHC: 34.4 g/dL (ref 32.0–36.0)
MCV: 89 fL (ref 80–100)
MONO ABS: 0.7 x10 3/mm (ref 0.2–0.9)
Monocyte %: 10.1 %
NEUTROS ABS: 4.2 10*3/uL (ref 1.4–6.5)
NEUTROS PCT: 63.7 %
Platelet: 242 10*3/uL (ref 150–440)
RBC: 4.18 10*6/uL (ref 3.80–5.20)
RDW: 14.2 % (ref 11.5–14.5)
WBC: 6.6 10*3/uL (ref 3.6–11.0)

## 2013-04-15 ENCOUNTER — Encounter: Payer: Self-pay | Admitting: Family Medicine

## 2013-04-15 ENCOUNTER — Ambulatory Visit (INDEPENDENT_AMBULATORY_CARE_PROVIDER_SITE_OTHER): Payer: 59 | Admitting: Family Medicine

## 2013-04-15 VITALS — BP 110/76 | HR 71 | Temp 97.7°F | Ht 69.0 in | Wt 193.5 lb

## 2013-04-15 DIAGNOSIS — K56609 Unspecified intestinal obstruction, unspecified as to partial versus complete obstruction: Secondary | ICD-10-CM

## 2013-04-15 DIAGNOSIS — Z8719 Personal history of other diseases of the digestive system: Secondary | ICD-10-CM | POA: Insufficient documentation

## 2013-04-15 DIAGNOSIS — K56600 Partial intestinal obstruction, unspecified as to cause: Secondary | ICD-10-CM

## 2013-04-15 NOTE — Progress Notes (Signed)
Subjective:    Patient ID: Diana Cook, female    DOB: 1950-05-23, 63 y.o.   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 Dyspnea on exertion 02/10/2013  . Bronchitis with bronchospasm 03/08/2012  . Complete heart block 11/03/2011  . [employer] 11/03/2011  . Lumbar spinal stenosis 09/02/2011  . Fibrocystic breast disease 08/12/2010  . POSTMENOPAUSAL STATUS 08/06/2009  . EUSTACHIAN TUBE DYSFUNCTION 06/26/2008  . HIDRADENITIS SUPPURATIVA 06/26/2008  . HERNIATED DISC 12/14/2007  . SACROILIITIS, LEFT 12/02/2007  . BACK, LOWER, PAIN 12/02/2007  . SCIATICA, LEFT 12/02/2007  . HX, PERSONAL, COLONIC POLYPS 09/14/2006  . Former smoker 06/10/2006  . ASTHMA 06/10/2006  . SEIZURE DISORDER 06/10/2006   Past Medical History  Diagnosis Date  . Asthma     as a child, mild now  . Seizure disorder   . Colon polyps     colonoscopy 7/08, tubular adenoma  . Tobacco abuse   . Allergic rhinitis   . Complete heart block 8/13    s/p PPM implanted in [facility]   Past Surgical History  Procedure Laterality Date  . Cataract extraction    . Gyn surgery  1993    hysterectomy- form endometriosis  . Laparoscopy    . Breast biopsy  11/2005    benign inflammatory changes  . Small intestine surgery  12/2000    for blockage  . Pacemaker insertion  10/24/11    [employer] Advantio dual chamber PPM implanted by Dr Bunnie Philips at [facility] in [place]   History  Substance Use Topics  . Smoking status: Former Smoker -- 0.40 packs/day for 40 years    Types: Cigarettes    Quit date: 11/11/2010  . Smokeless tobacco: Never Used  . Alcohol Use: Yes     Comment: Occasional wine   Family History  Problem Relation Age of Onset  . Stroke Mother   . Heart disease Mother     MI  . Dementia Mother   . Coronary artery disease Father   . Parkinsonism Father    Allergies  Allergen Reactions  . Codeine     REACTION: nausea  . Morphine And Related    Current Outpatient Prescriptions on File Prior to Visit  Medication Sig Dispense Refill  . acetaminophen (TYLENOL) 500 MG tablet Take 500 mg by mouth every 6 (six) hours as needed.      Marland Kitchen albuterol (PROAIR HFA) 108 (90 BASE) MCG/ACT inhaler Inhale 2 puffs into the lungs every 4 (four) hours as needed for wheezing or shortness of breath.  1 Inhaler  5  . Calcium Carbonate-Vit D-Min (CALCIUM 600+D PLUS MINERALS) 600-400 MG-UNIT TABS Take 2 by mouth daily       . clindamycin (CLEOCIN T) 1 %  lotion Apply to affected area once daily as needed for break outs       . cyclobenzaprine (FLEXERIL) 10 MG tablet Take 10 mg by mouth 3 (three) times daily as needed for muscle spasms.      Marland Kitchen doxycycline (DORYX) 100 MG DR capsule Take 1 capsule (100 mg total) by mouth 2 (two) times daily. For 14 days prn for flare of hydraadenitis  28 capsule  1   No current facility-administered medications on file prior to visit.     Review of Systems Review of Systems  Constitutional: Negative for fever, appetite change, fatigue and unexpected weight change.  Eyes: Negative for pain and visual disturbance.  Respiratory: Negative for cough and shortness of breath.   Cardiovascular: Negative for cp or palpitations    Gastrointestinal: Negative for nausea, diarrhea and constipation. neg for abd pain or vomiting    Genitourinary: Negative for urgency and frequency.  Skin: Negative for pallor or rash   Neurological: Negative for weakness, light-headedness, numbness and headaches.  Hematological: Negative for adenopathy. Does not bruise/bleed easily.  Psychiatric/Behavioral: Negative for dysphoric mood. The patient is not nervous/anxious.         Objective:   Physical Exam  Constitutional: She appears well-developed and well-nourished. No distress.  overwt and well appearing   HENT:  Head: Normocephalic and atraumatic.  Eyes: Conjunctivae and EOM are normal. Pupils are equal, round, and reactive to light. No scleral icterus.  Neck: Normal range of motion. Neck supple.  Cardiovascular: Normal rate, regular rhythm, normal heart sounds and intact distal pulses.  Exam reveals no gallop.   Pulmonary/Chest: Effort normal and breath sounds normal. No respiratory distress. She has no wheezes. She has no rales.  Abdominal: Soft. Bowel sounds are normal. She exhibits no distension and no mass. There is no tenderness. There is no rebound and no guarding.  Musculoskeletal: She exhibits no edema.  Lymphadenopathy:    She has no cervical adenopathy.  Neurological: She is alert.  Skin: Skin is warm and dry. No rash noted. No pallor.  Psychiatric: She has a normal mood and affect.          Assessment & Plan:

## 2013-04-15 NOTE — Patient Instructions (Signed)
Schedule a PE when you are able  I'm glad you are feeling better - let us know if any GI symptoms return  Gradually advance diet back to your normal  Check out a chair exercise program until you can get back to your regular exercise

## 2013-04-15 NOTE — Progress Notes (Signed)
Pre visit review using our clinic review tool, if applicable. No additional management support is needed unless otherwise documented below in the visit note. 

## 2013-04-17 NOTE — Assessment & Plan Note (Signed)
Clinically resolved - caused by adhesions (recurrent) Rev hosp records in detail with pt  Gradually resuming nl diet  Rev symptoms of bowel obs

## 2013-05-17 ENCOUNTER — Encounter: Payer: Self-pay | Admitting: Internal Medicine

## 2013-05-17 ENCOUNTER — Ambulatory Visit (INDEPENDENT_AMBULATORY_CARE_PROVIDER_SITE_OTHER): Payer: 59 | Admitting: Internal Medicine

## 2013-05-17 VITALS — BP 116/81 | HR 67 | Ht 68.5 in | Wt 195.5 lb

## 2013-05-17 DIAGNOSIS — I442 Atrioventricular block, complete: Secondary | ICD-10-CM

## 2013-05-17 LAB — MDC_IDC_ENUM_SESS_TYPE_INCLINIC
Battery Remaining Longevity: 72 mo
Brady Statistic RA Percent Paced: 14 %
Brady Statistic RV Percent Paced: 100 %
Date Time Interrogation Session: 20150324040000
Lead Channel Pacing Threshold Amplitude: 0.5 V
Lead Channel Pacing Threshold Pulse Width: 0.5 ms
Lead Channel Pacing Threshold Pulse Width: 0.5 ms
Lead Channel Setting Pacing Amplitude: 2.4 V
Lead Channel Setting Sensing Sensitivity: 2.5 mV
MDC IDC MSMT LEADCHNL RA IMPEDANCE VALUE: 523 Ohm
MDC IDC MSMT LEADCHNL RA SENSING INTR AMPL: 4.1 mV
MDC IDC MSMT LEADCHNL RV IMPEDANCE VALUE: 512 Ohm
MDC IDC MSMT LEADCHNL RV PACING THRESHOLD AMPLITUDE: 0.6 V
MDC IDC PG SERIAL: 134132
MDC IDC SET LEADCHNL RA PACING AMPLITUDE: 2 V
MDC IDC SET LEADCHNL RV PACING PULSEWIDTH: 0.5 ms
Zone Setting Detection Interval: 375 ms

## 2013-05-17 NOTE — Progress Notes (Signed)
Patient Care Team: Abner Greenspan, MD as PCP - General   HPI  Diana Cook is a 63 y.o. female Seen by both Drs. Allred and Lovena Le in the past who developed complete heart block and underwent pacing fall 2013.  Catheterization reportedly demonstrated no coronary artery disease  Echocardiogram 9/13 demonstrated normal left ventricular   When seen last fall she had developed dyspnea. We initiated low-dose diuretics. An echocardiogram  Confirmed retained left ventricularsystolic function this is improved.  However, she has had major problems with exercise over the last few months related #1) back issues and 2) recurrent problems with adhesions and obstruction for which she required surgery a month or 2 ago.    Past Medical History  Diagnosis Date  . Asthma     as a child, mild now  . Seizure disorder   . Colon polyps     colonoscopy 7/08, tubular adenoma  . Tobacco abuse   . Allergic rhinitis   . Complete heart block 8/13    s/p PPM implanted in [facility]    Past Surgical History  Procedure Laterality Date  . Cataract extraction    . Gyn surgery  1993    hysterectomy- form endometriosis  . Laparoscopy    . Breast biopsy  11/2005    benign inflammatory changes  . Small intestine surgery  12/2000    for blockage  . Pacemaker insertion  10/24/11    [employer] Advantio dual chamber PPM implanted by Dr Bunnie Philips at [facility]    Current Outpatient Prescriptions  Medication Sig Dispense Refill  . acetaminophen (TYLENOL) 500 MG tablet Take 500 mg by mouth every 6 (six) hours as needed.      . Calcium Carbonate-Vit D-Min (CALCIUM 600+D PLUS MINERALS) 600-400 MG-UNIT TABS Take 2 by mouth daily       . clindamycin (CLEOCIN T) 1 % lotion Apply to affected area once daily as needed for break outs       . doxycycline (DORYX) 100 MG DR capsule Take 1 capsule (100 mg total) by mouth 2 (two) times daily. For 14 days prn for flare of  hydraadenitis  28 capsule  1   No current facility-administered medications for this visit.    Allergies  Allergen Reactions  . Codeine     REACTION: nausea  . Morphine And Related     Review of Systems negative except from HPI and PMH  Physical Exam BP 116/81  Pulse 67  Ht 5' 8.5" (1.74 m)  Wt 195 lb 8 oz (88.678 kg)  BMI 29.29 kg/m2 Well developed and well nourished in no acute distress HENT normal E scleral and icterus clear Neck Supple JVP flat; carotids brisk and full Clear to ausculation ,Regular rate and rhythm, no murmurs gallops or rub Soft with active bowel sounds No clubbing cyanosis  Edema Alert and oriented, grossly normal motor and sensory function Skin Warm and Dry    Assessment and  Plan  High-grade heart block  Pacemaker-[employer] The patient's device was interrogated.  The information was reviewed. No changes were made in the programming.     Question sinus node dysfunction/peripheral incompetence  HFpEF-stable  The patient has difficulty with exercise. There is the suggestion of chronotropic incompetence with very few heart beats faster than 110 which represents about 70% of her predicted max. I've encouraged her to pursue exercise. This she will try to do as her back heels  and his her stomach feels. We will regroup a year or before that if she takes it she is limited in her exercise at which time we'll undertake treadmill testing to assess for chronotropic office.

## 2013-05-17 NOTE — Patient Instructions (Signed)
Remote monitoring is used to monitor your Pacemaker of ICD from home. This monitoring reduces the number of office visits required to check your device to one time per year. It allows [place] to keep an eye on the functioning of your device to ensure it is working properly. You are scheduled for a device check from home on 08/18/13. You may send your transmission at any time that day. If you have a wireless device, the transmission will be sent automatically. After your physician reviews your transmission, you will receive a postcard with your next transmission date.  Your physician wants you to follow-up in: 1 year. You will receive a reminder letter in the mail two months in advance. If you don't receive a letter, please call our office to schedule the follow-up appointment.

## 2013-05-24 ENCOUNTER — Other Ambulatory Visit: Payer: Self-pay

## 2013-05-24 ENCOUNTER — Telehealth: Payer: Self-pay

## 2013-05-24 NOTE — Telephone Encounter (Signed)
Please advise if patient needs NTG. I do not see NTG on the patient's medication list.

## 2013-05-24 NOTE — Telephone Encounter (Signed)
Please review chart and let me know if okay to have NTG; do not see on list.

## 2013-05-24 NOTE — Telephone Encounter (Signed)
Do not see any history that would suggest this med, nor any current reasons for this pt taking this medication. Does not warrant refill at this time, per Dr Caryl Comes

## 2013-05-24 NOTE — Telephone Encounter (Signed)
Notified patient, do not see any history that would suggest this med, nor any current reasons for this pt taking this medication. Does not warrant refill at this time, per Dr Caryl Comes.

## 2013-05-24 NOTE — Telephone Encounter (Signed)
Pt needs refill for Nytroglycerin. [facility].

## 2013-06-26 ENCOUNTER — Telehealth: Payer: Self-pay | Admitting: Family Medicine

## 2013-06-26 DIAGNOSIS — Z0000 Encounter for general adult medical examination without abnormal findings: Secondary | ICD-10-CM

## 2013-06-26 NOTE — Telephone Encounter (Signed)
Message copied by Abner Greenspan on Sun Jun 26, 2013  6:24 PM ------      Message from: Ellamae Sia      Created: Thu Jun 16, 2013  9:47 AM      Regarding: Lab orders for Monday,5.4.15       Patient is scheduled for CPX labs, please order future labs, Thanks , Terri       ------

## 2013-06-27 ENCOUNTER — Other Ambulatory Visit (INDEPENDENT_AMBULATORY_CARE_PROVIDER_SITE_OTHER): Payer: 59

## 2013-06-27 DIAGNOSIS — Z0000 Encounter for general adult medical examination without abnormal findings: Secondary | ICD-10-CM

## 2013-06-27 LAB — CBC WITH DIFFERENTIAL/PLATELET
BASOS PCT: 0.5 % (ref 0.0–3.0)
Basophils Absolute: 0 10*3/uL (ref 0.0–0.1)
Eosinophils Absolute: 0.1 10*3/uL (ref 0.0–0.7)
Eosinophils Relative: 2 % (ref 0.0–5.0)
HEMATOCRIT: 41.6 % (ref 36.0–46.0)
HEMOGLOBIN: 13.9 g/dL (ref 12.0–15.0)
Lymphocytes Relative: 18.5 % (ref 12.0–46.0)
Lymphs Abs: 1.3 10*3/uL (ref 0.7–4.0)
MCHC: 33.4 g/dL (ref 30.0–36.0)
MCV: 89.1 fl (ref 78.0–100.0)
MONOS PCT: 7.8 % (ref 3.0–12.0)
Monocytes Absolute: 0.6 10*3/uL (ref 0.1–1.0)
NEUTROS ABS: 5.1 10*3/uL (ref 1.4–7.7)
Neutrophils Relative %: 71.2 % (ref 43.0–77.0)
Platelets: 242 10*3/uL (ref 150.0–400.0)
RBC: 4.67 Mil/uL (ref 3.87–5.11)
RDW: 13.6 % (ref 11.5–14.6)
WBC: 7.1 10*3/uL (ref 4.5–10.5)

## 2013-06-27 LAB — LIPID PANEL
Cholesterol: 194 mg/dL (ref 0–200)
HDL: 57.7 mg/dL (ref >39.00)
LDL Cholesterol: 111 mg/dL — ABNORMAL HIGH (ref 0–99)
Total CHOL/HDL Ratio: 3
Triglycerides: 126 mg/dL (ref 0.0–149.0)
VLDL: 25.2 mg/dL (ref 0.0–40.0)

## 2013-06-27 LAB — COMPREHENSIVE METABOLIC PANEL
ALT: 17 U/L (ref 0–35)
AST: 26 U/L (ref 0–37)
Albumin: 4.4 g/dL (ref 3.5–5.2)
Alkaline Phosphatase: 91 U/L (ref 39–117)
BUN: 15 mg/dL (ref 6–23)
CO2: 25 meq/L (ref 19–32)
Calcium: 10.2 mg/dL (ref 8.4–10.5)
Chloride: 105 mEq/L (ref 96–112)
Creatinine, Ser: 0.9 mg/dL (ref 0.4–1.2)
GFR: 71.36 mL/min (ref >60.00)
Glucose, Bld: 85 mg/dL (ref 70–99)
Potassium: 4.6 mEq/L (ref 3.5–5.1)
SODIUM: 141 meq/L (ref 135–145)
TOTAL PROTEIN: 8 g/dL (ref 6.0–8.3)
Total Bilirubin: 0.7 mg/dL (ref 0.3–1.2)

## 2013-06-27 LAB — TSH: TSH: 0.94 u[IU]/mL (ref 0.35–5.50)

## 2013-07-04 ENCOUNTER — Encounter: Payer: Self-pay | Admitting: Family Medicine

## 2013-07-04 ENCOUNTER — Ambulatory Visit (INDEPENDENT_AMBULATORY_CARE_PROVIDER_SITE_OTHER): Payer: 59 | Admitting: Family Medicine

## 2013-07-04 VITALS — BP 130/78 | HR 65 | Temp 97.9°F | Ht 68.5 in | Wt 193.0 lb

## 2013-07-04 DIAGNOSIS — Z0000 Encounter for general adult medical examination without abnormal findings: Secondary | ICD-10-CM

## 2013-07-04 NOTE — Patient Instructions (Signed)
Don't forget to schedule your own mammogram Take care of yourself Gradually increase exercise time as tolerate  If you are interested in a shingles/zoster vaccine - call your insurance to check on coverage,( you should not get it within 1 month of other vaccines) , then call us for a prescription  for it to take to a pharmacy that gives the shot , or make a nurse visit to get it here depending on your coverage

## 2013-07-04 NOTE — Assessment & Plan Note (Signed)
Reviewed health habits including diet and exercise and skin cancer prevention Reviewed appropriate screening tests for age  Also reviewed health mt list, fam hx and immunization status , as well as social and family history   Pt will check on cost of zoster vaccine  See HPI Labs reviewed

## 2013-07-04 NOTE — Progress Notes (Signed)
Subjective:    Patient ID: Diana Cook, female    DOB: 01-07-1951, 63 y.o.   MRN: [id number]  HPI Here for health maintenance exam and to review chronic medical problems    Still has grief-loss of mother and brother  Adj to retirement   Wt is down 2 lb with bmi of 28 She really wants to loose weight  Exercise- does her video  Eats a healthy diet as a rule (she has lost wt with weight watchers in the past)  She has tried it again - she is not able to stick to the eating plan - ? If not for hunger-her   Still not smoking - proud of that   Mammogram 8/12 was normal - thinks she has had one since then - she goes to [facility]  No lumps on self exam  Pap 1/07 Hysterectomy No gyn symptoms at all    Zoster status - she has had shingles in the past but not the vaccine (may have had it more than once) May consider vaccine if affordible  Flu shot 10/14  Td 4/09  dexa 3/08   colonosc 8/12  Had recent bowel obstruction-no further symptoms   Results for orders placed in visit on 06/27/13  CBC WITH DIFFERENTIAL      Result Value Ref Range   WBC 7.1  4.5 - 10.5 K/uL   RBC 4.67  3.87 - 5.11 Mil/uL   Hemoglobin 13.9  12.0 - 15.0 g/dL   HCT 41.6  36.0 - 46.0 %   MCV 89.1  78.0 - 100.0 fl   MCHC 33.4  30.0 - 36.0 g/dL   RDW 13.6  11.5 - 14.6 %   Platelets 242.0  150.0 - 400.0 K/uL   Neutrophils Relative % 71.2  43.0 - 77.0 %   Lymphocytes Relative 18.5  12.0 - 46.0 %   Monocytes Relative 7.8  3.0 - 12.0 %   Eosinophils Relative 2.0  0.0 - 5.0 %   Basophils Relative 0.5  0.0 - 3.0 %   Neutro Abs 5.1  1.4 - 7.7 K/uL   Lymphs Abs 1.3  0.7 - 4.0 K/uL   Monocytes Absolute 0.6  0.1 - 1.0 K/uL   Eosinophils Absolute 0.1  0.0 - 0.7 K/uL   Basophils Absolute 0.0  0.0 - 0.1 K/uL  COMPREHENSIVE METABOLIC PANEL      Result Value Ref Range   Sodium 141  135 - 145 mEq/L   Potassium 4.6  3.5 - 5.1 mEq/L   Chloride 105  96 - 112 mEq/L   CO2 25  19 - 32 mEq/L   Glucose, Bld 85  70 -  99 mg/dL   BUN 15  6 - 23 mg/dL   Creatinine, Ser 0.9  0.4 - 1.2 mg/dL   Total Bilirubin 0.7  0.3 - 1.2 mg/dL   Alkaline Phosphatase 91  39 - 117 U/L   AST 26  0 - 37 U/L   ALT 17  0 - 35 U/L   Total Protein 8.0  6.0 - 8.3 g/dL   Albumin 4.4  3.5 - 5.2 g/dL   Calcium 10.2  8.4 - 10.5 mg/dL   GFR 71.36  >60.00 mL/min  LIPID PANEL      Result Value Ref Range   Cholesterol 194  0 - 200 mg/dL   Triglycerides 126.0  0.0 - 149.0 mg/dL   HDL 57.70  >39.00 mg/dL   VLDL 25.2  0.0 - 40.0 mg/dL  LDL Cholesterol 111 (*) 0 - 99 mg/dL   Total CHOL/HDL Ratio 3    TSH      Result Value Ref Range   TSH 0.94  0.35 - 5.50 uIU/mL     Patient Active Problem List   Diagnosis Date Noted  . Routine general medical examination at a health care facility 06/26/2013  . Partial small bowel obstruction 04/15/2013  . Dyspnea on exertion 02/10/2013  . Complete heart block 11/03/2011  . [employer] 11/03/2011  . Lumbar spinal stenosis 09/02/2011  . Fibrocystic breast disease 08/12/2010  . POSTMENOPAUSAL STATUS 08/06/2009  . EUSTACHIAN TUBE DYSFUNCTION 06/26/2008  . HIDRADENITIS SUPPURATIVA 06/26/2008  . HERNIATED DISC 12/14/2007  . SACROILIITIS, LEFT 12/02/2007  . BACK, LOWER, PAIN 12/02/2007  . SCIATICA, LEFT 12/02/2007  . HX, PERSONAL, COLONIC POLYPS 09/14/2006  . Former smoker 06/10/2006  . ASTHMA 06/10/2006  . SEIZURE DISORDER 06/10/2006   Past Medical History  Diagnosis Date  . Asthma     as a child, mild now  . Seizure disorder   . Colon polyps     colonoscopy 7/08, tubular adenoma  . Tobacco abuse   . Allergic rhinitis   . Complete heart block 8/13    s/p PPM implanted in [facility]   Past Surgical History  Procedure Laterality Date  . Cataract extraction    . Gyn surgery  1993    hysterectomy- form endometriosis  . Laparoscopy    . Breast biopsy  11/2005    benign inflammatory changes  . Small intestine surgery  12/2000    for blockage  . Pacemaker  insertion  10/24/11    [employer] Advantio dual chamber PPM implanted by Dr Bunnie Philips at [facility] in [place]   History  Substance Use Topics  . Smoking status: Former Smoker -- 0.40 packs/day for 40 years    Types: Cigarettes    Quit date: 11/11/2010  . Smokeless tobacco: Never Used  . Alcohol Use: Yes     Comment: Occasional wine   Family History  Problem Relation Age of Onset  . Stroke Mother   . Heart disease Mother     MI  . Dementia Mother   . Coronary artery disease Father   . Parkinsonism Father    Allergies  Allergen Reactions  . Codeine     REACTION: nausea  . Morphine And Related     Review of Systems Review of Systems  Constitutional: Negative for fever, appetite change, fatigue and unexpected weight change. (pt is frustrated over weight however) Eyes: Negative for pain and visual disturbance.  Respiratory: Negative for cough and shortness of breath.   Cardiovascular: Negative for cp or palpitations    Gastrointestinal: Negative for nausea, diarrhea and constipation.  Genitourinary: Negative for urgency and frequency.  Skin: Negative for pallor or rash   Neurological: Negative for weakness, light-headedness, numbness and headaches.  Hematological: Negative for adenopathy. Does not bruise/bleed easily.  Psychiatric/Behavioral: Negative for dysphoric mood. The patient is not nervous/anxious.         Objective:   Physical Exam  Constitutional: She appears well-developed and well-nourished. No distress.  overwt and well appearing   HENT:  Head: Normocephalic and atraumatic.  Right Ear: External ear normal.  Left Ear: External ear normal.  Mouth/Throat: Oropharynx is clear and moist.  Eyes: Conjunctivae and EOM are normal. Pupils are equal, round, and reactive to light. No scleral icterus.  Neck: Normal range of motion. Neck supple. No JVD  present. Carotid bruit is not present. No thyromegaly present.  Cardiovascular: Normal  rate, regular rhythm, normal heart sounds and intact distal pulses.  Exam reveals no gallop.   Pulmonary/Chest: Effort normal and breath sounds normal. No respiratory distress. She has no wheezes. She exhibits no tenderness.  Abdominal: Soft. Bowel sounds are normal. She exhibits no distension, no abdominal bruit and no mass. There is no tenderness.  Genitourinary: No breast swelling, tenderness, discharge or bleeding.  Breast exam: No mass, nodules, thickening, tenderness, bulging, retraction, inflamation, nipple discharge or skin changes noted.  No axillary or clavicular LA.      Musculoskeletal: Normal range of motion. She exhibits no edema and no tenderness.  Lymphadenopathy:    She has no cervical adenopathy.  Neurological: She is alert. She has normal reflexes. No cranial nerve deficit. She exhibits normal muscle tone. Coordination normal.  Skin: Skin is warm and dry. No rash noted. No erythema. No pallor.  Solar lentigos diffusely   Erythematous papules from hydradenitis in axillae  Psychiatric: She has a normal mood and affect.          Assessment & Plan:

## 2013-07-04 NOTE — Progress Notes (Signed)
Pre visit review using our clinic review tool, if applicable. No additional management support is needed unless otherwise documented below in the visit note. 

## 2013-07-21 ENCOUNTER — Emergency Department: Payer: Self-pay | Admitting: Emergency Medicine

## 2013-07-21 ENCOUNTER — Ambulatory Visit: Payer: 59 | Admitting: Family Medicine

## 2013-07-21 LAB — APTT: Activated PTT: 30.9 secs (ref 23.6–35.9)

## 2013-07-21 LAB — COMPREHENSIVE METABOLIC PANEL
ALBUMIN: 3.8 g/dL (ref 3.4–5.0)
ALT: 17 U/L (ref 12–78)
ANION GAP: 9 (ref 7–16)
Alkaline Phosphatase: 105 U/L
BUN: 19 mg/dL — ABNORMAL HIGH (ref 7–18)
Bilirubin,Total: 0.6 mg/dL (ref 0.2–1.0)
CALCIUM: 9.4 mg/dL (ref 8.5–10.1)
CO2: 22 mmol/L (ref 21–32)
Chloride: 105 mmol/L (ref 98–107)
Creatinine: 0.94 mg/dL (ref 0.60–1.30)
EGFR (African American): >60
EGFR (Non-African Amer.): >60
GLUCOSE: 131 mg/dL — AB (ref 65–99)
Osmolality: 276 (ref 275–301)
Potassium: 4.2 mmol/L (ref 3.5–5.1)
SGOT(AST): 31 U/L (ref 15–37)
Sodium: 136 mmol/L (ref 136–145)
Total Protein: 7.7 g/dL (ref 6.4–8.2)

## 2013-07-21 LAB — CBC WITH DIFFERENTIAL/PLATELET
BASOS ABS: 0.1 10*3/uL (ref 0.0–0.1)
BASOS PCT: 1.5 %
Eosinophil #: 0.1 10*3/uL (ref 0.0–0.7)
Eosinophil %: 1.4 %
HCT: 39.5 % (ref 35.0–47.0)
HGB: 13 g/dL (ref 12.0–16.0)
LYMPHS ABS: 0.9 10*3/uL — AB (ref 1.0–3.6)
Lymphocyte %: 15.2 %
MCH: 29.6 pg (ref 26.0–34.0)
MCHC: 33 g/dL (ref 32.0–36.0)
MCV: 90 fL (ref 80–100)
Monocyte #: 0.6 x10 3/mm (ref 0.2–0.9)
Monocyte %: 10.6 %
Neutrophil #: 4.2 10*3/uL (ref 1.4–6.5)
Neutrophil %: 71.3 %
PLATELETS: 252 10*3/uL (ref 150–440)
RBC: 4.4 10*6/uL (ref 3.80–5.20)
RDW: 13.6 % (ref 11.5–14.5)
WBC: 5.9 10*3/uL (ref 3.6–11.0)

## 2013-07-21 LAB — TROPONIN I: Troponin-I: <0.02 ng/mL

## 2013-07-21 LAB — PROTIME-INR
INR: 1.1
PROTHROMBIN TIME: 14 s (ref 11.5–14.7)

## 2013-08-09 ENCOUNTER — Other Ambulatory Visit: Payer: Self-pay

## 2013-08-09 MED ORDER — ALBUTEROL SULFATE HFA 108 (90 BASE) MCG/ACT IN AERS: 2.0000 | INHALATION_SPRAY | RESPIRATORY_TRACT | Status: DC | PRN

## 2013-08-09 NOTE — Telephone Encounter (Signed)
Pt request refill [facility]ol [facility] [address]. Advised done.

## 2013-08-11 ENCOUNTER — Telehealth: Payer: Self-pay | Admitting: Internal Medicine

## 2013-08-11 NOTE — Telephone Encounter (Signed)
Patient wants to r/s her remote check scheduled for 08-18-13. She is going out of town.

## 2013-08-17 ENCOUNTER — Ambulatory Visit (INDEPENDENT_AMBULATORY_CARE_PROVIDER_SITE_OTHER): Payer: 59 | Admitting: *Deleted

## 2013-08-17 DIAGNOSIS — I442 Atrioventricular block, complete: Secondary | ICD-10-CM

## 2013-08-17 NOTE — Progress Notes (Signed)
Remote pacemaker transmission.   

## 2013-08-18 LAB — MDC_IDC_ENUM_SESS_TYPE_REMOTE
Brady Statistic RA Percent Paced: 14 %
Brady Statistic RV Percent Paced: 100 %
Implantable Pulse Generator Serial Number: 134132
Lead Channel Impedance Value: 536 Ohm
Lead Channel Sensing Intrinsic Amplitude: 4.9 mV
Lead Channel Setting Pacing Amplitude: 2.4 V
Lead Channel Setting Sensing Sensitivity: 2.5 mV
MDC IDC MSMT LEADCHNL RV IMPEDANCE VALUE: 478 Ohm
MDC IDC SET LEADCHNL RA PACING AMPLITUDE: 2 V
MDC IDC SET LEADCHNL RV PACING PULSEWIDTH: 0.5 ms
Zone Setting Detection Interval: 375 ms

## 2013-08-30 ENCOUNTER — Encounter: Payer: Self-pay | Admitting: Cardiology

## 2013-08-30 DIAGNOSIS — IMO0002 Reserved for concepts with insufficient information to code with codable children: Secondary | ICD-10-CM | POA: Diagnosis not present

## 2013-08-30 DIAGNOSIS — M25569 Pain in unspecified knee: Secondary | ICD-10-CM | POA: Diagnosis not present

## 2013-08-30 DIAGNOSIS — M1710 Unilateral primary osteoarthritis, unspecified knee: Secondary | ICD-10-CM | POA: Diagnosis not present

## 2013-09-01 ENCOUNTER — Encounter: Payer: Self-pay | Admitting: Family Medicine

## 2013-09-01 ENCOUNTER — Ambulatory Visit: Payer: Self-pay | Admitting: Family Medicine

## 2013-09-01 DIAGNOSIS — Z1231 Encounter for screening mammogram for malignant neoplasm of breast: Secondary | ICD-10-CM | POA: Diagnosis not present

## 2013-09-02 ENCOUNTER — Encounter: Payer: Self-pay | Admitting: *Deleted

## 2013-09-06 ENCOUNTER — Encounter: Payer: Self-pay | Admitting: Internal Medicine

## 2013-09-13 ENCOUNTER — Other Ambulatory Visit: Payer: Self-pay

## 2013-09-13 MED ORDER — FUROSEMIDE 20 MG PO TABS: 20.0000 mg | ORAL_TABLET | Freq: Every day | ORAL | Status: DC | PRN

## 2013-10-20 DIAGNOSIS — M5137 Other intervertebral disc degeneration, lumbosacral region: Secondary | ICD-10-CM | POA: Diagnosis not present

## 2013-10-20 DIAGNOSIS — IMO0002 Reserved for concepts with insufficient information to code with codable children: Secondary | ICD-10-CM | POA: Diagnosis not present

## 2013-11-03 DIAGNOSIS — IMO0002 Reserved for concepts with insufficient information to code with codable children: Secondary | ICD-10-CM | POA: Diagnosis not present

## 2013-11-03 DIAGNOSIS — M5137 Other intervertebral disc degeneration, lumbosacral region: Secondary | ICD-10-CM | POA: Diagnosis not present

## 2013-11-08 DIAGNOSIS — M1710 Unilateral primary osteoarthritis, unspecified knee: Secondary | ICD-10-CM | POA: Diagnosis not present

## 2013-11-08 DIAGNOSIS — IMO0002 Reserved for concepts with insufficient information to code with codable children: Secondary | ICD-10-CM | POA: Diagnosis not present

## 2013-11-14 ENCOUNTER — Telehealth: Payer: Self-pay | Admitting: Family Medicine

## 2013-11-14 MED ORDER — DOXYCYCLINE HYCLATE 100 MG PO TABS: 100.0000 mg | ORAL_TABLET | Freq: Two times a day (BID) | ORAL | Status: DC

## 2013-11-14 NOTE — Telephone Encounter (Signed)
Pt notified Rx sent 

## 2013-11-14 NOTE — Telephone Encounter (Signed)
[facility].  She needs a refill of her Doxycycline 100 mg for her bumps on her skin.  She requests a call back [phone]

## 2013-11-14 NOTE — Telephone Encounter (Signed)
Will refill electronically  

## 2013-11-15 DIAGNOSIS — IMO0002 Reserved for concepts with insufficient information to code with codable children: Secondary | ICD-10-CM | POA: Diagnosis not present

## 2013-11-15 DIAGNOSIS — M1710 Unilateral primary osteoarthritis, unspecified knee: Secondary | ICD-10-CM | POA: Diagnosis not present

## 2013-11-22 DIAGNOSIS — M1710 Unilateral primary osteoarthritis, unspecified knee: Secondary | ICD-10-CM | POA: Diagnosis not present

## 2013-11-22 DIAGNOSIS — IMO0002 Reserved for concepts with insufficient information to code with codable children: Secondary | ICD-10-CM | POA: Diagnosis not present

## 2013-11-23 ENCOUNTER — Encounter: Payer: Self-pay | Admitting: Internal Medicine

## 2013-11-23 ENCOUNTER — Ambulatory Visit (INDEPENDENT_AMBULATORY_CARE_PROVIDER_SITE_OTHER): Payer: 59 | Admitting: *Deleted

## 2013-11-23 DIAGNOSIS — I442 Atrioventricular block, complete: Secondary | ICD-10-CM | POA: Diagnosis not present

## 2013-11-24 NOTE — Progress Notes (Signed)
Remote pacemaker transmission.   

## 2013-11-28 LAB — MDC_IDC_ENUM_SESS_TYPE_REMOTE
Battery Remaining Longevity: 5.5
Brady Statistic RV Percent Paced: 100 %
Lead Channel Setting Pacing Amplitude: 2 V
Lead Channel Setting Pacing Amplitude: 2.4 V
MDC IDC MSMT LEADCHNL RA IMPEDANCE VALUE: 451 Ohm
MDC IDC MSMT LEADCHNL RV IMPEDANCE VALUE: 481 Ohm
MDC IDC PG SERIAL: 134132
MDC IDC SET LEADCHNL RV PACING PULSEWIDTH: 0.5 ms
MDC IDC SET LEADCHNL RV SENSING SENSITIVITY: 2.5 mV
MDC IDC SET ZONE DETECTION INTERVAL: 375 ms
MDC IDC STAT BRADY RA PERCENT PACED: 16 %

## 2013-12-02 DIAGNOSIS — M5416 Radiculopathy, lumbar region: Secondary | ICD-10-CM | POA: Diagnosis not present

## 2013-12-02 DIAGNOSIS — M5136 Other intervertebral disc degeneration, lumbar region: Secondary | ICD-10-CM | POA: Diagnosis not present

## 2013-12-08 DIAGNOSIS — M5416 Radiculopathy, lumbar region: Secondary | ICD-10-CM | POA: Diagnosis not present

## 2013-12-08 DIAGNOSIS — M5136 Other intervertebral disc degeneration, lumbar region: Secondary | ICD-10-CM | POA: Diagnosis not present

## 2013-12-09 ENCOUNTER — Ambulatory Visit (INDEPENDENT_AMBULATORY_CARE_PROVIDER_SITE_OTHER): Payer: Medicare Other

## 2013-12-09 DIAGNOSIS — Z23 Encounter for immunization: Secondary | ICD-10-CM | POA: Diagnosis not present

## 2013-12-13 ENCOUNTER — Telehealth: Payer: Self-pay

## 2013-12-13 ENCOUNTER — Encounter: Payer: Self-pay | Admitting: Cardiology

## 2013-12-13 NOTE — Telephone Encounter (Signed)
Pt states she has not received anything from her remote device check. States she feels very uncomfortable using this. Please call.

## 2013-12-14 NOTE — Telephone Encounter (Signed)
Left message on home and mobile # : transmission received and reviewed by the physician.  Next remote is scheduled for 03/01/14.  A letter was mailed 12/13/13 with results.

## 2014-03-01 ENCOUNTER — Encounter: Payer: Self-pay | Admitting: Internal Medicine

## 2014-03-01 ENCOUNTER — Ambulatory Visit (INDEPENDENT_AMBULATORY_CARE_PROVIDER_SITE_OTHER): Payer: Medicare Other | Admitting: *Deleted

## 2014-03-01 DIAGNOSIS — I442 Atrioventricular block, complete: Secondary | ICD-10-CM | POA: Diagnosis not present

## 2014-03-01 LAB — MDC_IDC_ENUM_SESS_TYPE_REMOTE
Battery Remaining Longevity: 60 mo
Implantable Pulse Generator Serial Number: 134132
Lead Channel Impedance Value: 454 Ohm
Lead Channel Impedance Value: 479 Ohm
Lead Channel Pacing Threshold Amplitude: 0.5 V
Lead Channel Pacing Threshold Pulse Width: 0.5 ms
Lead Channel Setting Pacing Amplitude: 2 V
MDC IDC MSMT BATTERY REMAINING PERCENTAGE: 97 %
MDC IDC SESS DTM: 20160106063200
MDC IDC SET LEADCHNL RV PACING AMPLITUDE: 2.4 V
MDC IDC SET LEADCHNL RV PACING PULSEWIDTH: 0.5 ms
MDC IDC SET LEADCHNL RV SENSING SENSITIVITY: 2.5 mV
MDC IDC SET ZONE DETECTION INTERVAL: 375 ms
MDC IDC STAT BRADY RA PERCENT PACED: 15 %
MDC IDC STAT BRADY RV PERCENT PACED: 100 %

## 2014-03-01 NOTE — Progress Notes (Signed)
Remote pacemaker transmission.   

## 2014-03-15 DIAGNOSIS — Z961 Presence of intraocular lens: Secondary | ICD-10-CM | POA: Diagnosis not present

## 2014-03-23 DIAGNOSIS — H02403 Unspecified ptosis of bilateral eyelids: Secondary | ICD-10-CM | POA: Diagnosis not present

## 2014-04-03 ENCOUNTER — Encounter: Payer: Self-pay | Admitting: Cardiology

## 2014-04-17 DIAGNOSIS — H0230 Blepharochalasis unspecified eye, unspecified eyelid: Secondary | ICD-10-CM | POA: Diagnosis not present

## 2014-04-17 DIAGNOSIS — H5340 Unspecified visual field defects: Secondary | ICD-10-CM | POA: Diagnosis not present

## 2014-04-17 DIAGNOSIS — H02403 Unspecified ptosis of bilateral eyelids: Secondary | ICD-10-CM | POA: Diagnosis not present

## 2014-05-23 ENCOUNTER — Ambulatory Visit (INDEPENDENT_AMBULATORY_CARE_PROVIDER_SITE_OTHER): Payer: Medicare Other | Admitting: Internal Medicine

## 2014-05-23 ENCOUNTER — Encounter: Payer: Self-pay | Admitting: Internal Medicine

## 2014-05-23 ENCOUNTER — Telehealth: Payer: Self-pay

## 2014-05-23 VITALS — BP 110/70 | HR 66 | Ht 68.5 in | Wt 175.5 lb

## 2014-05-23 DIAGNOSIS — I442 Atrioventricular block, complete: Secondary | ICD-10-CM

## 2014-05-23 DIAGNOSIS — Z950 Presence of cardiac pacemaker: Secondary | ICD-10-CM | POA: Diagnosis not present

## 2014-05-23 LAB — MDC_IDC_ENUM_SESS_TYPE_INCLINIC
Brady Statistic RV Percent Paced: 99 %
Lead Channel Impedance Value: 501 Ohm
Lead Channel Impedance Value: 548 Ohm
Lead Channel Pacing Threshold Pulse Width: 0.5 ms
Lead Channel Pacing Threshold Pulse Width: 0.5 ms
Lead Channel Setting Pacing Amplitude: 2 V
Lead Channel Setting Pacing Amplitude: 2.4 V
Lead Channel Setting Pacing Pulse Width: 0.5 ms
MDC IDC MSMT LEADCHNL RA PACING THRESHOLD AMPLITUDE: 0.6 V
MDC IDC MSMT LEADCHNL RA SENSING INTR AMPL: 4 mV
MDC IDC MSMT LEADCHNL RV PACING THRESHOLD AMPLITUDE: 0.4 V
MDC IDC PG SERIAL: 134132
MDC IDC SET LEADCHNL RV SENSING SENSITIVITY: 2.5 mV
MDC IDC SET ZONE DETECTION INTERVAL: 375 ms
MDC IDC STAT BRADY RA PERCENT PACED: 19 %

## 2014-05-23 NOTE — Patient Instructions (Signed)
Your physician wants you to follow-up in: 1 year. You will receive a reminder letter in the mail two months in advance. If you don't receive a letter, please call our office to schedule the follow-up appointment.  

## 2014-05-23 NOTE — Progress Notes (Signed)
Patient Care Team: Abner Greenspan, MD as PCP - General   HPI  Diana Cook is a 64 y.o. female Seen by both Drs. Allred and Lovena Le in the past who developed complete heart block and underwent pacing fall 2013.  Catheterization reportedly demonstrated no coronary artery disease  Echocardiogram 9/13 demonstrated normal left ventricular   She continues to struggle with some shortness of breath. This is particularly notable when she's outsider trying to walk and talk. An echocardiogram 12/14  Confirmed retained left ventricularsystolic function this is improved.       Past Medical History  Diagnosis Date  . Asthma     as a child, mild now  . Seizure disorder   . Colon polyps     colonoscopy 7/08, tubular adenoma  . Tobacco abuse   . Allergic rhinitis   . Complete heart block 8/13    s/p PPM implanted in [facility]    Past Surgical History  Procedure Laterality Date  . Cataract extraction    . Gyn surgery  1993    hysterectomy- form endometriosis  . Laparoscopy    . Breast biopsy  11/2005    benign inflammatory changes  . Small intestine surgery  12/2000    for blockage  . Pacemaker insertion  10/24/11    [employer] Advantio dual chamber PPM implanted by Dr Bunnie Philips at [facility] in [facility]    Current Outpatient Prescriptions  Medication Sig Dispense Refill  . acetaminophen (TYLENOL) 500 MG tablet Take 500 mg by mouth every 6 (six) hours as needed.    Marland Kitchen albuterol (PROAIR HFA) 108 (90 BASE) MCG/ACT inhaler Inhale 2 puffs into the lungs every 4 (four) hours as needed for wheezing or shortness of breath. 1 Inhaler 5  . clindamycin (CLEOCIN T) 1 % lotion Apply to affected area once daily as needed for break outs     . Diphenhydramine-APAP, sleep, (TYLENOL PM EXTRA STRENGTH PO) Take by mouth daily.    . furosemide (LASIX) 20 MG tablet Take 1 tablet (20 mg total) by mouth daily as needed. 30 tablet 3   No current  facility-administered medications for this visit.    Allergies  Allergen Reactions  . Codeine     REACTION: nausea  . Morphine And Related     Review of Systems negative except from HPI and PMH  Physical Exam BP 110/70 mmHg  Pulse 66  Ht 5' 8.5" (1.74 m)  Wt 175 lb 8 oz (79.606 kg)  BMI 26.29 kg/m2 Well developed and well nourished in no acute distress HENT normal E scleral and icterus clear Neck Supple JVP flat; carotids brisk and full Clear to ausculation ,Regular rate and rhythm, no murmurs gallops or rub Soft with active bowel sounds No clubbing cyanosis  Edema Alert and oriented, grossly normal motor and sensory function Skin Warm and Dry    Assessment and  Plan  High-grade heart block  Pacemaker-[employer] The patient's device was interrogated.  The information was reviewed. No changes were made in the programming.     Question sinus node dysfunction/peripheral incompetence  HFpEF-stable  she is euvolemic  She continues with some dyspnea. She has known COPD. We did a series of exercise tests in the office to see if we could modulate her dyspnea by reprogramming; no clear benefits were noted. Her heart rate is relatively brisk but her complaints were not while walking the stairs but afterwards I thought was a  over sensitive minute ventilation sensor and so we have turned it off.

## 2014-05-23 NOTE — Telephone Encounter (Signed)
Pt left v/m; pt last seen 07/04/13 and has CPX scheduled on 07/19/14 with Dr Glori Bickers; pt request generic Lorrin Mais to [address]. Pt does not want to wait until 06/2014 appt to get medication for sleep. Pt can go to sleep but wakes up several times during the night and has difficulty getting back to sleep each time. Pt is very tired when wakes up in AM. Pt request cb. Pt has tried [place] before and stopped med due to cost.

## 2014-05-23 NOTE — Telephone Encounter (Signed)
I don't see previous [place] script in chart. Will route to PCP.

## 2014-05-24 MED ORDER — ZOLPIDEM TARTRATE 10 MG PO TABS: 5.0000 mg | ORAL_TABLET | Freq: Every evening | ORAL | Status: DC | PRN

## 2014-05-24 NOTE — Telephone Encounter (Signed)
Px written for call in   Thanks

## 2014-05-24 NOTE — Telephone Encounter (Signed)
[facility].  Patient informed.

## 2014-06-17 NOTE — H&P (Signed)
PATIENT NAME:  Diana Cook, Diana Cook MR#:  [id number] DATE OF BIRTH:  [birth year]  DATE OF ADMISSION:  04/01/2013  ADMITTING DIAGNOSES: 1.  Partial small bowel versus small bowel obstruction, likely secondary to adhesions.  2.  History of multiple previous small bowel obstructions.   HISTORY OF PRESENT ILLNESS:    This is a 64 year old otherwise healthy white female with a past surgical history including a total abdominal hysterectomy when she was in her late 20s/early 67s for severe endometriosis.  The patient states that that was a prolonged operation and two weeks later she was readmitted to the same hospital with abdominal pain, distention, bowel obstruction requiring laparotomy at that time.  Since then, the patient has had another operation approximately 10 to 15 years ago while residing in [place] for bowel obstruction.  In the year 2006 she was admitted to this hospital and was treated with a nasogastric tube.  She remembers at least one other admission for nasogastric tube decompression.  In between these episodes the patient denies having any type of obstructive-type symptoms.    The current symptoms started approximately 14 hours ago with diffuse lower and middle abdominal pain with abdominal cramping followed by nausea and vomiting.  The patient's last meal was approximately 6:00 pm.  The patient states that she had a bowel movement yesterday as well as small passage of flatus while in the Emergency Room.  An oral contrasted CT scan was performed and reviewed concerning for bowel obstruction.  Following the oral contrast the patient had diffuse emesis.  A nasogastric tube has been placed.  Surgical services were asked to comment regarding the patient's need for admission.   ALLERGIES:  CODEINE.   MEDICATIONS:  Estrogen.   PAST MEDICAL HISTORY:  Endometriosis.  History of myocardial infarction and pacemaker insertion about 1 year ago.  PAST SURGICAL HISTORY:  1.  Total abdominal  hysterectomy.  2.  Laparotomy for adhesiolysis and small bowel obstruction two weeks following above operation. 3.  Lysis of adhesions for small bowel obstruction approximately 15 years ago.  4.  Pacemaker insertion.  SOCIAL HISTORY:  The patient is a reformed cigarette smoker.  Does not drink.  She is married, accompanied by her husband and daughter.   FAMILY HISTORY:  Noncontributory.   REVIEW OF SYSTEMS:  Is as described in the history of the present illness and otherwise 10-point review is unremarkable.   PHYSICAL EXAMINATION: GENERAL:  The patient is in no obvious distress.  She has a nasogastric tube in place.  VITAL SIGNS:  Temperature is 98.6, pulse of 91, blood pressure is 119/75, room air saturation is 100%.  She is 5 feet 8 inches, 185 pounds, BMI of 28.1.  NECK:  Supple.  No adenopathy.  LUNGS:  Clear.  HEART:  Regular rate and rhythm.  ABDOMEN:  Distended and somewhat tender in the lower abdomen and middle portion of the abdomen, especially around the umbilicus.  There is a small carbuncle just to the left of the umbilicus measuring approximately 8 mm.  No obvious drainage of pus.  There is no obvious ventral hernia.  There is high pitched tinkling bowel sounds.  The abdomen is tympanitic.  Mildly tender to palpation with no obvious peritoneal signs.  EXTREMITIES:  Warm and well perfused.  NEUROLOGIC AND PSYCHIATRIC:  Unremarkable.  RECTAL:  Deferred.   LABORATORY DATA:  Glucose 104, BUN of 30, creatinine 0.92, sodium 134, potassium 4.0.  Total calcium 10.6, lipase 91.  Liver function tests are normal.  White count is 10.6, hemoglobin 14.9, platelet count is 284,000.    Review of plain films demonstrates no free air.  There is dilated small bowel loops measuring up to 4.5 cm.  A small amount of colonic gas was identified.    CT scan of the abdomen and pelvis with oral contrast demonstrates dilated loops of small intestine, a small hiatal hernia, colon is decompressed.   Transition point is difficult to identify.  There is no evidence of pneumatosis or free air or free fluid.  Remaining solid organs appear unremarkable.  Chest x-ray is unremarkable.   IMPRESSION:  This is a 64 year old white female with a small bowel obstruction, likely adhesions.  She had a colonoscopy in the last several years which by her report is unremarkable.  Given her history of recurrent small bowel obstructions in the past, nasogastric tube decompression and hydration prior to consideration of surgical intervention is warranted.   PLAN:  The patient will be admitted, hydrated, nasogastric tube will be continued with low intermittent wall suction.  We will obtain plain films in the morning, follow oral contrast load.  It would be helpful to obtain surgical records from [facility].   Total time spent 60 minutes.    ____________________________ Jeannette How Marina Gravel, MD Ricke Hey D: 04/01/2013 03:04:48 ET T: 04/01/2013 03:57:06 ET JOB#: [id number]  cc: Elta Guadeloupe A. Marina Gravel, MD, <Dictator> Hortencia Conradi MD ELECTRONICALLY SIGNED 04/01/2013 6:56

## 2014-06-17 NOTE — Discharge Summary (Signed)
PATIENT NAME:  Diana Cook, Diana Cook MR#:  [id number] DATE OF BIRTH:  1950-12-14  DATE OF ADMISSION:  04/01/2013  DATE OF DISCHARGE:  04/05/2013  DIAGNOSES:  1.  Partial small bowel obstruction, resolved.  2.  Endometriosis.  3.  Pacemaker placement.   PROCEDURES: None.   HISTORY OF PRESENT ILLNESS AND HOSPITAL COURSE: This is a patient who has had multiple abdominal surgeries for partial small bowel obstruction, who presented with nausea, vomiting, and distention, and failure to pass gas. She was admitted to the hospital and observed. Over time, her condition improved considerably, where she started passing gas and having bowel movements. Had no further abdominal pain. Required no further pain medications. Her nausea and vomiting abated. She is currently tolerating a full liquid diet, having bowel movements and passing gas, with no pain. She is discharged in stable condition to follow up in our office as needed, in the Emergency Room if she were to have recurrent pain or nausea/vomiting as before, or to follow up with the primary care physician as needed as well. No new medications are prescribed. She will restart her normal home medications.    ____________________________ Jerrol Banana Burt Knack, MD rec:mr D: 04/05/2013 16:57:09 ET T: 04/05/2013 19:04:56 ET JOB#: [id number]  cc: Jerrol Banana. Burt Knack, MD, <Dictator> Florene Glen MD ELECTRONICALLY SIGNED 04/06/2013 16:48

## 2014-06-23 DIAGNOSIS — H02831 Dermatochalasis of right upper eyelid: Secondary | ICD-10-CM | POA: Diagnosis not present

## 2014-06-23 DIAGNOSIS — H5340 Unspecified visual field defects: Secondary | ICD-10-CM | POA: Diagnosis not present

## 2014-06-23 DIAGNOSIS — H02834 Dermatochalasis of left upper eyelid: Secondary | ICD-10-CM | POA: Diagnosis not present

## 2014-07-10 ENCOUNTER — Telehealth: Payer: Self-pay | Admitting: Radiology

## 2014-07-10 ENCOUNTER — Other Ambulatory Visit (INDEPENDENT_AMBULATORY_CARE_PROVIDER_SITE_OTHER): Payer: Medicare Other

## 2014-07-10 ENCOUNTER — Telehealth: Payer: Self-pay | Admitting: Family Medicine

## 2014-07-10 DIAGNOSIS — Z0000 Encounter for general adult medical examination without abnormal findings: Secondary | ICD-10-CM

## 2014-07-10 DIAGNOSIS — I442 Atrioventricular block, complete: Secondary | ICD-10-CM

## 2014-07-10 LAB — TSH: TSH: 0.92 u[IU]/mL (ref 0.35–4.50)

## 2014-07-10 LAB — CBC WITH DIFFERENTIAL/PLATELET
BASOS PCT: 0.4 % (ref 0.0–3.0)
Basophils Absolute: 0 10*3/uL (ref 0.0–0.1)
EOS PCT: 1.3 % (ref 0.0–5.0)
Eosinophils Absolute: 0.1 10*3/uL (ref 0.0–0.7)
HCT: 40.6 % (ref 36.0–46.0)
Hemoglobin: 13.8 g/dL (ref 12.0–15.0)
LYMPHS ABS: 1.4 10*3/uL (ref 0.7–4.0)
Lymphocytes Relative: 17.5 % (ref 12.0–46.0)
MCHC: 34 g/dL (ref 30.0–36.0)
MCV: 86.4 fl (ref 78.0–100.0)
MONO ABS: 0.6 10*3/uL (ref 0.1–1.0)
MONOS PCT: 7.7 % (ref 3.0–12.0)
Neutro Abs: 5.9 10*3/uL (ref 1.4–7.7)
Neutrophils Relative %: 73.1 % (ref 43.0–77.0)
Platelets: 292 10*3/uL (ref 150.0–400.0)
RBC: 4.7 Mil/uL (ref 3.87–5.11)
RDW: 13.8 % (ref 11.5–15.5)
WBC: 8.1 10*3/uL (ref 4.0–10.5)

## 2014-07-10 LAB — COMPREHENSIVE METABOLIC PANEL
ALBUMIN: 4.3 g/dL (ref 3.5–5.2)
ALT: 17 U/L (ref 0–35)
AST: 22 U/L (ref 0–37)
Alkaline Phosphatase: 91 U/L (ref 39–117)
BUN: 18 mg/dL (ref 6–23)
CALCIUM: 10.6 mg/dL — AB (ref 8.4–10.5)
CO2: 30 mEq/L (ref 19–32)
Chloride: 101 mEq/L (ref 96–112)
Creatinine, Ser: 0.89 mg/dL (ref 0.40–1.20)
GFR: 67.45 mL/min (ref >60.00)
GLUCOSE: 101 mg/dL — AB (ref 70–99)
Potassium: 4.7 mEq/L (ref 3.5–5.1)
Sodium: 135 mEq/L (ref 135–145)
TOTAL PROTEIN: 7.5 g/dL (ref 6.0–8.3)
Total Bilirubin: 0.8 mg/dL (ref 0.2–1.2)

## 2014-07-10 LAB — LIPID PANEL
Cholesterol: 168 mg/dL (ref 0–200)
HDL: 55.8 mg/dL (ref >39.00)
LDL Cholesterol: 97 mg/dL (ref 0–99)
NONHDL: 112.2
Total CHOL/HDL Ratio: 3
Triglycerides: 76 mg/dL (ref 0.0–149.0)
VLDL: 15.2 mg/dL (ref 0.0–40.0)

## 2014-07-10 NOTE — Telephone Encounter (Signed)
-----   Message from Virgina Organ sent at 07/10/2014  8:23 AM EDT ----- Regarding: CPE labs needed [facility] rescheduled her Medicare Wellness to tomorrow and needs you to put her fasting CPE lab orders in for today.    (her daughter, and your patient as well Diana Cook 09/17/1978 has been diagnosed with Cervical Cancer and the parents will be taking her to [facility] daily for the next 8 weeks)  Thank you Dr. Glori Bickers!  Hope you had a lovely weekend!  Vaughan Basta

## 2014-07-11 ENCOUNTER — Encounter: Payer: Self-pay | Admitting: Family Medicine

## 2014-07-11 ENCOUNTER — Ambulatory Visit (INDEPENDENT_AMBULATORY_CARE_PROVIDER_SITE_OTHER): Payer: Medicare Other | Admitting: Family Medicine

## 2014-07-11 VITALS — BP 104/70 | HR 60 | Temp 97.6°F | Ht 68.0 in | Wt 164.5 lb

## 2014-07-11 DIAGNOSIS — Z0000 Encounter for general adult medical examination without abnormal findings: Secondary | ICD-10-CM | POA: Diagnosis not present

## 2014-07-11 DIAGNOSIS — Z23 Encounter for immunization: Secondary | ICD-10-CM | POA: Diagnosis not present

## 2014-07-11 MED ORDER — ALBUTEROL SULFATE HFA 108 (90 BASE) MCG/ACT IN AERS: 2.0000 | INHALATION_SPRAY | Freq: Four times a day (QID) | RESPIRATORY_TRACT | Status: DC | PRN

## 2014-07-11 NOTE — Assessment & Plan Note (Signed)
Reviewed health habits includingYour mammogram will be due in July-don't forget to schedule that this summer  prevnar vaccine today If you are interested in a shingles/zoster vaccine - call your insurance to check on coverage,( you should not get it within 1 month of other vaccines) , then call us for a prescription  for it to take to a pharmacy that gives the shot , or make a nurse visit to get it here depending on your coverage    (would not do this until your daughter is done with her treatment)  Lets do a bone density test in the next year or so to get a baseline-let me know when you are ready Try to get 1200-1500 mg of calcium per day with at least 1000 iu of vitamin D - for bone health  diet and exercise and skin cancer prevention Reviewed appropriate screening tests for age  Also reviewed health mt list, fam hx and immunization status , as well as social and family history   See HPI Labs reviewed  Pt has achieved her goal wt- commended Aletha Halim continue wt watchers   Rev stressors- caring for daughter- she will alert [place] if interested in counseling or other help

## 2014-07-11 NOTE — Progress Notes (Signed)
Pre visit review using our clinic review tool, if applicable. No additional management support is needed unless otherwise documented below in the visit note. 

## 2014-07-11 NOTE — Progress Notes (Signed)
Subjective:    Patient ID: Diana Cook, female    DOB: 08-09-1950, 64 y.o.   MRN: [id number]  HPI Here for annual medicare wellness visit as well as chronic/acute medical problems   I have personally reviewed the Medicare Annual Wellness questionnaire and have noted 1. The patient's medical and social history 2. Their use of alcohol, tobacco or illicit drugs 3. Their current medications and supplements 4. The patient's functional ability including ADL's, fall risks, home safety risks and hearing or visual             impairment. 5. Diet and physical activities 6. Evidence for depression or mood disorders  The patients weight, height, BMI have been recorded in the chart and visual acuity is per eye clinic.  I have made referrals, counseling and provided education to the patient based review of the above and I have provided the pt with a written personalized care plan for preventive services. Reviewed and updated provider list, see scanned forms.  Some stressors- daughter is about to start tx for cervical cancer - she will take her for the treatments   Otherwise has been fair  See scanned forms.  Routine anticipatory guidance given to patient.  See health maintenance. Colon cancer screening 8/12 with 5 y f/u for polyps Breast cancer screening mammogram nl 7/15 Self breast exam - no lumps or changes - has scar tissue from old procedure under R breast that sometimes bothers her  Flu vaccine 10/15  Tetanus vaccine 4/09 Pneumovax 1/07, will get prevnar today  Zoster vaccine - has had shingles in the past  dexa nl 3/08  Advance directive-has a living will and POA  Cognitive function addressed- see scanned forms- and if abnormal then additional documentation follows.  Notices little things but mainly good   PMH and SH reviewed  Meds, vitals, and allergies reviewed.   ROS: See HPI.  Otherwise negative.     Has had a hysterectomy in the past - no gyn problems   Recent  pacemaker adj from cardiology - she had not been able to exercise as much as she wanted  Is doing better so far -thinks she will be able to push it  He also suggested medication for sleep - has never slept well and now the ambien helps her sleep solid for 3-4 hours - glad it is helping   Results for orders placed or performed in visit on 07/10/14  CBC with Differential/Platelet  Result Value Ref Range   WBC 8.1 4.0 - 10.5 K/uL   RBC 4.70 3.87 - 5.11 Mil/uL   Hemoglobin 13.8 12.0 - 15.0 g/dL   HCT 40.6 36.0 - 46.0 %   MCV 86.4 78.0 - 100.0 fl   MCHC 34.0 30.0 - 36.0 g/dL   RDW 13.8 11.5 - 15.5 %   Platelets 292.0 150.0 - 400.0 K/uL   Neutrophils Relative % 73.1 43.0 - 77.0 %   Lymphocytes Relative 17.5 12.0 - 46.0 %   Monocytes Relative 7.7 3.0 - 12.0 %   Eosinophils Relative 1.3 0.0 - 5.0 %   Basophils Relative 0.4 0.0 - 3.0 %   Neutro Abs 5.9 1.4 - 7.7 K/uL   Lymphs Abs 1.4 0.7 - 4.0 K/uL   Monocytes Absolute 0.6 0.1 - 1.0 K/uL   Eosinophils Absolute 0.1 0.0 - 0.7 K/uL   Basophils Absolute 0.0 0.0 - 0.1 K/uL  Comprehensive metabolic panel  Result Value Ref Range   Sodium 135 135 - 145 mEq/L  Potassium 4.7 3.5 - 5.1 mEq/L   Chloride 101 96 - 112 mEq/L   CO2 30 19 - 32 mEq/L   Glucose, Bld 101 (H) 70 - 99 mg/dL   BUN 18 6 - 23 mg/dL   Creatinine, Ser 0.89 0.40 - 1.20 mg/dL   Total Bilirubin 0.8 0.2 - 1.2 mg/dL   Alkaline Phosphatase 91 39 - 117 U/L   AST 22 0 - 37 U/L   ALT 17 0 - 35 U/L   Total Protein 7.5 6.0 - 8.3 g/dL   Albumin 4.3 3.5 - 5.2 g/dL   Calcium 10.6 (H) 8.4 - 10.5 mg/dL   GFR 67.45 >60.00 mL/min  Lipid panel  Result Value Ref Range   Cholesterol 168 0 - 200 mg/dL   Triglycerides 76.0 0.0 - 149.0 mg/dL   HDL 55.80 >39.00 mg/dL   VLDL 15.2 0.0 - 40.0 mg/dL   LDL Cholesterol 97 0 - 99 mg/dL   Total CHOL/HDL Ratio 3    NonHDL 112.20   TSH  Result Value Ref Range   TSH 0.92 0.35 - 4.50 uIU/mL    Patient Active Problem List   Diagnosis Date Noted  .  Routine general medical examination at a health care facility 06/26/2013  . H/O small bowel obstruction 04/15/2013  . Dyspnea on exertion 02/10/2013  . Complete heart block 11/03/2011  . [employer] 11/03/2011  . Lumbar spinal stenosis 09/02/2011  . Fibrocystic breast disease 08/12/2010  . POSTMENOPAUSAL STATUS 08/06/2009  . HIDRADENITIS SUPPURATIVA 06/26/2008  . HERNIATED DISC 12/14/2007  . SACROILIITIS, LEFT 12/02/2007  . BACK, LOWER, PAIN 12/02/2007  . HX, PERSONAL, COLONIC POLYPS 09/14/2006  . Former smoker 06/10/2006  . ASTHMA 06/10/2006  . H/O idiopathic seizure 06/10/2006   Past Medical History  Diagnosis Date  . Asthma     as a child, mild now  . Seizure disorder   . Colon polyps     colonoscopy 7/08, tubular adenoma  . Tobacco abuse   . Allergic rhinitis   . Complete heart block 8/13    s/p PPM implanted in [facility]   Past Surgical History  Procedure Laterality Date  . Cataract extraction    . Gyn surgery  1993    hysterectomy- form endometriosis  . Laparoscopy    . Breast biopsy  11/2005    benign inflammatory changes  . Small intestine surgery  12/2000    for blockage  . Pacemaker insertion  10/24/11    [employer] Advantio dual chamber PPM implanted by Dr Bunnie Philips at [facility] in [place]   History  Substance Use Topics  . Smoking status: Former Smoker -- 0.40 packs/day for 40 years    Types: Cigarettes    Quit date: 11/11/2010  . Smokeless tobacco: Never Used  . Alcohol Use: 0.0 oz/week    0 Standard drinks or equivalent per week     Comment: Occasional wine   Family History  Problem Relation Age of Onset  . Stroke Mother   . Heart disease Mother     MI  . Dementia Mother   . Coronary artery disease Father   . Parkinsonism Father    Allergies  Allergen Reactions  . Codeine     REACTION: nausea  . Morphine And Related    Current Outpatient Prescriptions on File Prior to Visit  Medication  Sig Dispense Refill  . acetaminophen (TYLENOL) 500 MG tablet Take 500 mg by mouth every 6 (six) hours as needed.    Marland Kitchen  albuterol (PROAIR HFA) 108 (90 BASE) MCG/ACT inhaler Inhale 2 puffs into the lungs every 4 (four) hours as needed for wheezing or shortness of breath. 1 Inhaler 5  . clindamycin (CLEOCIN T) 1 % lotion Apply to affected area once daily as needed for break outs     . furosemide (LASIX) 20 MG tablet Take 1 tablet (20 mg total) by mouth daily as needed. 30 tablet 3  . zolpidem (AMBIEN) 10 MG tablet Take 0.5-1 tablets (5-10 mg total) by mouth at bedtime as needed for sleep. 30 tablet 2   No current facility-administered medications on file prior to visit.    Review of Systems Review of Systems  Constitutional: Negative for fever, appetite change, fatigue and unexpected weight change.  Eyes: Negative for pain and visual disturbance.  Respiratory: Negative for cough and shortness of breath.   Cardiovascular: Negative for cp or palpitations    Gastrointestinal: Negative for nausea, diarrhea and constipation.  Genitourinary: Negative for urgency and frequency.  Skin: Negative for pallor or rash   Neurological: Negative for weakness, light-headedness, numbness and headaches.  Hematological: Negative for adenopathy. Does not bruise/bleed easily.  Psychiatric/Behavioral: Negative for dysphoric mood. The patient is somewhat anxious with stress of her daughter's cancer lately        Objective:   Physical Exam  Constitutional: She appears well-developed and well-nourished. No distress.  Well app  HENT:  Head: Normocephalic and atraumatic.  Right Ear: External ear normal.  Left Ear: External ear normal.  Mouth/Throat: Oropharynx is clear and moist.  Eyes: Conjunctivae and EOM are normal. Pupils are equal, round, and reactive to light. No scleral icterus.  Neck: Normal range of motion. Neck supple. No JVD present. Carotid bruit is not present. No thyromegaly present.    Cardiovascular: Normal rate, regular rhythm, normal heart sounds and intact distal pulses.  Exam reveals no gallop.   Pulmonary/Chest: Effort normal and breath sounds normal. No respiratory distress. She has no wheezes. She exhibits no tenderness.  Abdominal: Soft. Bowel sounds are normal. She exhibits no distension, no abdominal bruit and no mass. There is no tenderness.  Genitourinary: No breast swelling, tenderness, discharge or bleeding.  Breast exam: No mass, nodules, thickening, tenderness, bulging, retraction, inflamation, nipple discharge or skin changes noted.  No axillary or clavicular LA.      Musculoskeletal: Normal range of motion. She exhibits no edema or tenderness.  Lymphadenopathy:    She has no cervical adenopathy.  Neurological: She is alert. She has normal reflexes. No cranial nerve deficit. She exhibits normal muscle tone. Coordination normal.  Skin: Skin is warm and dry. No rash noted. No erythema. No pallor.  Solar lentigos diffusely   Psychiatric: She has a normal mood and affect.          Assessment & Plan:   Problem List Items Addressed This Visit    Encounter for Medicare annual wellness exam    Reviewed health habits including diet and exercise and skin cancer prevention Reviewed appropriate screening tests for age  Also reviewed health mt list, fam hx and immunization status , as well as social and family history   See HPI Lab rev Your mammogram will be due in July-don't forget to schedule that this summer  prevnar vaccine today If you are interested in a shingles/zoster vaccine - call your insurance to check on coverage,( you should not get it within 1 month of other vaccines) , then call us for a prescription  for it to take to a  pharmacy that gives the shot , or make a nurse visit to get it here depending on your coverage    (would not do this until your daughter is done with her treatment)  Lets do a bone density test in the next year or so to get a  baseline-let me know when you are ready Try to get 1200-1500 mg of calcium per day with at least 1000 iu of vitamin D - for bone health       Routine general medical examination at a health care facility - Primary    Reviewed health habits includingYour mammogram will be due in July-don't forget to schedule that this summer  prevnar vaccine today If you are interested in a shingles/zoster vaccine - call your insurance to check on coverage,( you should not get it within 1 month of other vaccines) , then call us for a prescription  for it to take to a pharmacy that gives the shot , or make a nurse visit to get it here depending on your coverage    (would not do this until your daughter is done with her treatment)  Lets do a bone density test in the next year or so to get a baseline-let me know when you are ready Try to get 1200-1500 mg of calcium per day with at least 1000 iu of vitamin D - for bone health  diet and exercise and skin cancer prevention Reviewed appropriate screening tests for age  Also reviewed health mt list, fam hx and immunization status , as well as social and family history   See HPI Labs reviewed  Pt has achieved her goal wt- commended Aletha Halim continue wt watchers   Rev stressors- caring for daughter- she will alert [place] if interested in counseling or other help       Other Visit Diagnoses    Need for vaccination with 13-polyvalent pneumococcal conjugate vaccine        Relevant Orders    Pneumococcal conjugate vaccine 13-valent (Completed)

## 2014-07-11 NOTE — Patient Instructions (Signed)
Your mammogram will be due in July-don't forget to schedule that this summer  prevnar vaccine today If you are interested in a shingles/zoster vaccine - call your insurance to check on coverage,( you should not get it within 1 month of other vaccines) , then call us for a prescription  for it to take to a pharmacy that gives the shot , or make a nurse visit to get it here depending on your coverage    (would not do this until your daughter is done with her treatment)  Lets do a bone density test in the next year or so to get a baseline-let me know when you are ready Try to get 1200-1500 mg of calcium per day with at least 1000 iu of vitamin D - for bone health

## 2014-07-11 NOTE — Telephone Encounter (Signed)
Opened in error

## 2014-07-11 NOTE — Assessment & Plan Note (Signed)
Reviewed health habits including diet and exercise and skin cancer prevention Reviewed appropriate screening tests for age  Also reviewed health mt list, fam hx and immunization status , as well as social and family history   See HPI Lab rev Your mammogram will be due in July-don't forget to schedule that this summer  prevnar vaccine today If you are interested in a shingles/zoster vaccine - call your insurance to check on coverage,( you should not get it within 1 month of other vaccines) , then call us for a prescription  for it to take to a pharmacy that gives the shot , or make a nurse visit to get it here depending on your coverage    (would not do this until your daughter is done with her treatment)  Lets do a bone density test in the next year or so to get a baseline-let me know when you are ready Try to get 1200-1500 mg of calcium per day with at least 1000 iu of vitamin D - for bone health

## 2014-07-19 ENCOUNTER — Encounter: Payer: Medicare Other | Admitting: Family Medicine

## 2014-08-14 ENCOUNTER — Ambulatory Visit: Payer: Medicare Other | Admitting: Internal Medicine

## 2014-08-15 ENCOUNTER — Ambulatory Visit (INDEPENDENT_AMBULATORY_CARE_PROVIDER_SITE_OTHER): Payer: Medicare Other | Admitting: Primary Care

## 2014-08-15 ENCOUNTER — Encounter: Payer: Self-pay | Admitting: Primary Care

## 2014-08-15 VITALS — BP 102/64 | HR 62 | Temp 97.5°F | Ht 68.0 in | Wt 160.1 lb

## 2014-08-15 DIAGNOSIS — B029 Zoster without complications: Secondary | ICD-10-CM

## 2014-08-15 MED ORDER — GABAPENTIN 100 MG PO CAPS: 100.0000 mg | ORAL_CAPSULE | Freq: Two times a day (BID) | ORAL | Status: DC

## 2014-08-15 MED ORDER — VALACYCLOVIR HCL 1 G PO TABS: 1000.0000 mg | ORAL_TABLET | Freq: Three times a day (TID) | ORAL | Status: DC

## 2014-08-15 NOTE — Progress Notes (Signed)
Pre visit review using our clinic review tool, if applicable. No additional management support is needed unless otherwise documented below in the visit note. 

## 2014-08-15 NOTE — Progress Notes (Signed)
Subjective:    Patient ID: Diana Cook, female    DOB: 1950-04-18, 64 y.o.   MRN: [id number]  HPI  Diana Cook is a 64 year old female who presents today with a chief complaint of rash. Her rash has been present for the past 3 days and is very painful to touch and also painful internally.She describes her pain as sharp and stabbing. The rash is located to her anterior and posterior trunk in a linear pattern. She has a history of shingles. She's been under a lot of stress lately taking care of her ill child. Denies fevers, cough, or drainage to rash. She's not tried anything OTC.  Review of Systems  Constitutional: Negative for fever and chills.  Respiratory: Negative for cough and shortness of breath.   Cardiovascular: Negative for chest pain.  Skin: Positive for rash.       Past Medical History  Diagnosis Date  . Asthma     as a child, mild now  . Seizure disorder   . Colon polyps     colonoscopy 7/08, tubular adenoma  . Tobacco abuse   . Allergic rhinitis   . Complete heart block 8/13    s/p PPM implanted in [facility]    History   Social History  . Marital Status: Married    Spouse Name: N/A  . Number of Children: N/A  . Years of Education: N/A   Occupational History  . Not on file.   Social History Main Topics  . Smoking status: Former Smoker -- 0.40 packs/day for 40 years    Types: Cigarettes    Quit date: 11/11/2010  . Smokeless tobacco: Never Used  . Alcohol Use: 0.0 oz/week    0 Standard drinks or equivalent per week     Comment: Occasional wine  . Drug Use: No  . Sexual Activity: Not on file   Other Topics Concern  . Not on file   Social History Narrative   Married 40 years.   Exercises, does walking tapes    Past Surgical History  Procedure Laterality Date  . Cataract extraction    . Gyn surgery  1993    hysterectomy- form endometriosis  . Laparoscopy    . Breast biopsy  11/2005    benign inflammatory changes  . Small intestine surgery   12/2000    for blockage  . Pacemaker insertion  10/24/11    [employer] Advantio dual chamber PPM implanted by Dr Bunnie Philips at [facility] in [place]    Family History  Problem Relation Age of Onset  . Stroke Mother   . Heart disease Mother     MI  . Dementia Mother   . Coronary artery disease Father   . Parkinsonism Father     Allergies  Allergen Reactions  . Codeine     REACTION: nausea  . Morphine And Related     Current Outpatient Prescriptions on File Prior to Visit  Medication Sig Dispense Refill  . acetaminophen (TYLENOL) 500 MG tablet Take 500 mg by mouth every 6 (six) hours as needed.    Marland Kitchen albuterol (PROVENTIL HFA;VENTOLIN HFA) 108 (90 BASE) MCG/ACT inhaler Inhale 2 puffs into the lungs every 6 (six) hours as needed for wheezing or shortness of breath. 1 Inhaler 2  . clindamycin (CLEOCIN T) 1 % lotion Apply to affected area once daily as needed for break outs     . furosemide (LASIX) 20 MG tablet Take 1 tablet (  20 mg total) by mouth daily as needed. 30 tablet 3  . zolpidem (AMBIEN) 10 MG tablet Take 0.5-1 tablets (5-10 mg total) by mouth at bedtime as needed for sleep. 30 tablet 2   No current facility-administered medications on file prior to visit.    BP 102/64 mmHg  Pulse 62  Temp(Src) 97.5 F (36.4 C) (Oral)  Ht '5\' 8"'$  (1.727 m)  Wt 160 lb 1.9 oz (72.63 kg)  BMI 24.35 kg/m2  SpO2 96%    Objective:   Physical Exam  Cardiovascular: Normal rate and regular rhythm.   Pulmonary/Chest: Effort normal and breath sounds normal.  Skin: Skin is dry. Rash noted.  Linear rash with purpura starting to posterior left side of trunk to the lateral left trunk and to her left anterior chest and breast. Suggestive of shingles. Tender upon palpation.          Assessment & Plan:  Shingles:  Present in linear patter starting to left posterior trunk, lateral left trunk, and anterior left trunk. Moderate rash, very tender upon palpation. No  crusting. RX for Valtrex 1 g TID x 10 days. Gabapentin BID PRN pain. Instructed patient that she would be contagious until her rash started crusting. She is to obtain shingles vaccine once her shingles has resolved. Follow up PRN

## 2014-08-15 NOTE — Patient Instructions (Signed)
You have shingles.  Start Valtrex tablets. Take 1 tablet by mouth three times daily for 10 days. You may take gabapentin once to twice daily as needed for pain. Once your shingles has healed, please obtain a shingles vaccine.   It was very nice to meet you!  Shingles Shingles (herpes zoster) is an infection that is caused by the same virus that causes chickenpox (varicella). The infection causes a painful skin rash and fluid-filled blisters, which eventually break open, crust over, and heal. It may occur in any area of the body, but it usually affects only one side of the body or face. The pain of shingles usually lasts about 1 month. However, some people with shingles may develop long-term (chronic) pain in the affected area of the body. Shingles often occurs many years after the person had chickenpox. It is more common:  In people older than 50 years.  In people with weakened immune systems, such as those with HIV, AIDS, or cancer.  In people taking medicines that weaken the immune system, such as transplant medicines.  In people under great stress. CAUSES  Shingles is caused by the varicella zoster virus (VZV), which also causes chickenpox. After a person is infected with the virus, it can remain in the person's body for years in an inactive state (dormant). To cause shingles, the virus reactivates and breaks out as an infection in a nerve root. The virus can be spread from person to person (contagious) through contact with open blisters of the shingles rash. It will only spread to people who have not had chickenpox. When these people are exposed to the virus, they may develop chickenpox. They will not develop shingles. Once the blisters scab over, the person is no longer contagious and cannot spread the virus to others. SIGNS AND SYMPTOMS  Shingles shows up in stages. The initial symptoms may be pain, itching, and tingling in an area of the skin. This pain is usually described as burning,  stabbing, or throbbing.In a few days or weeks, a painful red rash will appear in the area where the pain, itching, and tingling were felt. The rash is usually on one side of the body in a band or belt-like pattern. Then, the rash usually turns into fluid-filled blisters. They will scab over and dry up in approximately 2-3 weeks. Flu-like symptoms may also occur with the initial symptoms, the rash, or the blisters. These may include:  Fever.  Chills.  Headache.  Upset stomach. DIAGNOSIS  Your health care provider will perform a skin exam to diagnose shingles. Skin scrapings or fluid samples may also be taken from the blisters. This sample will be examined under a microscope or sent to a lab for further testing. TREATMENT  There is no specific cure for shingles. Your health care provider will likely prescribe medicines to help you manage the pain, recover faster, and avoid long-term problems. This may include antiviral drugs, anti-inflammatory drugs, and pain medicines. HOME CARE INSTRUCTIONS   Take a cool bath or apply cool compresses to the area of the rash or blisters as directed. This may help with the pain and itching.   Take medicines only as directed by your health care provider.   Rest as directed by your health care provider.  Keep your rash and blisters clean with mild soap and cool water or as directed by your health care provider.  Do not pick your blisters or scratch your rash. Apply an anti-itch cream or numbing creams to the affected  area as directed by your health care provider.  Keep your shingles rash covered with a loose bandage (dressing).  Avoid skin contact with:  Babies.   Pregnant women.   Children with eczema.   Elderly people with transplants.   People with chronic illnesses, such as leukemia or AIDS.   Wear loose-fitting clothing to help ease the pain of material rubbing against the rash.  Keep all follow-up visits as directed by your health  care provider.If the area involved is on your face, you may receive a referral for a specialist, such as an eye doctor (ophthalmologist) or an ear, nose, and throat (ENT) doctor. Keeping all follow-up visits will help you avoid eye problems, chronic pain, or disability.  SEEK IMMEDIATE MEDICAL CARE IF:   You have facial pain, pain around the eye area, or loss of feeling on one side of your face.  You have ear pain or ringing in your ear.  You have loss of taste.  Your pain is not relieved with prescribed medicines.   Your redness or swelling spreads.   You have more pain and swelling.  Your condition is worsening or has changed.   You have a fever. MAKE SURE YOU:  Understand these instructions.  Will watch your condition.  Will get help right away if you are not doing well or get worse. Document Released: 02/10/2005 Document Revised: 06/27/2013 Document Reviewed: 09/25/2011 [facility] Patient Information 2015 [place], [place]. This information is not intended to replace advice given to you by your health care provider. Make sure you discuss any questions you have with your health care provider.

## 2014-08-16 ENCOUNTER — Other Ambulatory Visit: Payer: Self-pay | Admitting: Family Medicine

## 2014-08-16 ENCOUNTER — Other Ambulatory Visit: Payer: Self-pay | Admitting: Internal Medicine

## 2014-08-16 NOTE — Telephone Encounter (Signed)
Px written for call in   

## 2014-08-16 NOTE — Telephone Encounter (Signed)
CPE was 07/19/14 last refilled 05/24/14 #30 with 2 additional refills 30 tablet 2 05/24/2014

## 2014-08-17 ENCOUNTER — Other Ambulatory Visit: Payer: Self-pay | Admitting: Primary Care

## 2014-08-17 ENCOUNTER — Telehealth: Payer: Self-pay

## 2014-08-17 DIAGNOSIS — B029 Zoster without complications: Secondary | ICD-10-CM

## 2014-08-17 MED ORDER — GABAPENTIN 100 MG PO CAPS: 200.0000 mg | ORAL_CAPSULE | Freq: Two times a day (BID) | ORAL | Status: DC

## 2014-08-17 NOTE — Telephone Encounter (Signed)
Patient called back and asked to be called back.  She said she's in a lot of pain and would like to be called back as soon as possible.  Patient said she can be called back at her home number or cell number.

## 2014-08-17 NOTE — Telephone Encounter (Signed)
Spoke with patient regarding concerns. Will increase dose of gabapentin and send a refill to her pharmacy.

## 2014-08-17 NOTE — Telephone Encounter (Signed)
Pt left v/m; pt seen 08/15/14 with shingles; Taking gabapentin twice a day and pt is also taking Tylenol is not effective for shingles pain on back and breast. Pt wants to know if can increase dosage of gabapentin and pt was told about silverdene cream being helpful for shingles pain. Shingles have spread more since seen. Pt request [email or web address].

## 2014-08-17 NOTE — Telephone Encounter (Signed)
Rx called in as prescribed 

## 2014-08-18 ENCOUNTER — Telehealth: Payer: Self-pay | Admitting: Family Medicine

## 2014-08-18 DIAGNOSIS — B0229 Other postherpetic nervous system involvement: Secondary | ICD-10-CM

## 2014-08-18 MED ORDER — LIDOCAINE 5 % EX PTCH: 1.0000 | MEDICATED_PATCH | CUTANEOUS | Status: DC

## 2014-08-18 NOTE — Telephone Encounter (Signed)
Do not believe silvadene cream will help with pain. Will send in lidocaine patches. Patient notified and will call with an update on Monday.

## 2014-08-18 NOTE — Telephone Encounter (Signed)
Pt is requesting a cream to put on the shingles. Silverdene (silverfuazine) is the name of the cream. Please call cell phone, thanks

## 2014-08-22 ENCOUNTER — Ambulatory Visit (INDEPENDENT_AMBULATORY_CARE_PROVIDER_SITE_OTHER): Payer: Medicare Other | Admitting: *Deleted

## 2014-08-22 ENCOUNTER — Telehealth: Payer: Self-pay | Admitting: Family Medicine

## 2014-08-22 DIAGNOSIS — I442 Atrioventricular block, complete: Secondary | ICD-10-CM | POA: Diagnosis not present

## 2014-08-22 NOTE — Progress Notes (Signed)
Remote pacemaker transmission.   

## 2014-08-22 NOTE — Telephone Encounter (Signed)
Pt returned your call, please call back at home number

## 2014-08-22 NOTE — Telephone Encounter (Signed)
Called and spoken to patient. Notified her the Prior Authorization is requested for the lidoderm. Patient stated don't bother doing the PA. She does not want it.

## 2014-08-25 LAB — CUP PACEART REMOTE DEVICE CHECK
Battery Remaining Longevity: 60 mo
Battery Remaining Percentage: 96 %
Brady Statistic RV Percent Paced: 100 %
Date Time Interrogation Session: 20160628053100
Lead Channel Impedance Value: 551 Ohm
Lead Channel Setting Pacing Amplitude: 2 V
Lead Channel Setting Sensing Sensitivity: 2.5 mV
MDC IDC MSMT LEADCHNL RA SENSING INTR AMPL: 4.6 mV
MDC IDC MSMT LEADCHNL RV IMPEDANCE VALUE: 439 Ohm
MDC IDC SET LEADCHNL RV PACING AMPLITUDE: 2.4 V
MDC IDC SET LEADCHNL RV PACING PULSEWIDTH: 0.5 ms
MDC IDC STAT BRADY RA PERCENT PACED: 20 %
Pulse Gen Serial Number: 134132
Zone Setting Detection Interval: 375 ms

## 2014-09-01 ENCOUNTER — Ambulatory Visit (INDEPENDENT_AMBULATORY_CARE_PROVIDER_SITE_OTHER): Payer: Medicare Other | Admitting: Internal Medicine

## 2014-09-01 ENCOUNTER — Encounter: Payer: Self-pay | Admitting: Internal Medicine

## 2014-09-01 VITALS — BP 120/70 | HR 73 | Temp 97.9°F | Wt 159.0 lb

## 2014-09-01 DIAGNOSIS — B0229 Other postherpetic nervous system involvement: Secondary | ICD-10-CM | POA: Insufficient documentation

## 2014-09-01 MED ORDER — GABAPENTIN 300 MG PO CAPS: 300.0000 mg | ORAL_CAPSULE | Freq: Two times a day (BID) | ORAL | Status: DC

## 2014-09-01 NOTE — Patient Instructions (Signed)
Please restart the gabapentin at '300mg'$  three times a day. If you are not having significant relief, add the 4th capsule at night after 2-3 days. If you are not any better next week, let us know--we can increase the dose further.

## 2014-09-01 NOTE — Progress Notes (Signed)
Subjective:    Patient ID: Diana Cook, female    DOB: May 10, 1950, 64 y.o.   MRN: [id number]  HPI Here due to rash  Stress with daughter having cancer--back and forth to [facility] for cancer This delayed her coming in for the shingles Having very severe pain from this and hasn't let up  Did work up on gabapentin '300mg'$  bid This didn't seem to help Wasn't sedated by this Tylenol may help just a little  Rash has dried up and not inflamed as much Still very sensitive Stabbing pain  No fever  Current Outpatient Prescriptions on File Prior to Visit  Medication Sig Dispense Refill  . acetaminophen (TYLENOL) 500 MG tablet Take 500 mg by mouth every 6 (six) hours as needed.    Marland Kitchen albuterol (PROVENTIL HFA;VENTOLIN HFA) 108 (90 BASE) MCG/ACT inhaler Inhale 2 puffs into the lungs every 6 (six) hours as needed for wheezing or shortness of breath. 1 Inhaler 2  . furosemide (LASIX) 20 MG tablet TAKE ONE TABLET BY MOUTH ONCE DAILY AS NEEDED 30 tablet 3  . zolpidem (AMBIEN) 10 MG tablet TAKE ONE-HALF TO ONE TABLET BY MOUTH AT BEDTIME AS NEEDED FOR SLEEP 30 tablet 3   No current facility-administered medications on file prior to visit.    Allergies  Allergen Reactions  . Codeine     REACTION: nausea  . Morphine And Related     Past Medical History  Diagnosis Date  . Asthma     as a child, mild now  . Seizure disorder   . Colon polyps     colonoscopy 7/08, tubular adenoma  . Tobacco abuse   . Allergic rhinitis   . Complete heart block 8/13    s/p PPM [facility] in [facility]    Past Surgical History  Procedure Laterality Date  . Cataract extraction    . Gyn surgery  1993    hysterectomy- form endometriosis  . Laparoscopy    . Breast biopsy  11/2005    benign inflammatory changes  . Small intestine surgery  12/2000    for blockage  . Pacemaker insertion  10/24/11    [employer] Advantio dual chamber PPM implanted by Dr Bunnie Philips at [facility] in [place]    Family History  Problem Relation Age of Onset  . Stroke Mother   . Heart disease Mother     MI  . Dementia Mother   . Coronary artery disease Father   . Parkinsonism Father     History   Social History  . Marital Status: Married    Spouse Name: N/A  . Number of Children: N/A  . Years of Education: N/A   Occupational History  . Not on file.   Social History Main Topics  . Smoking status: Former Smoker -- 0.40 packs/day for 40 years    Types: Cigarettes    Quit date: 11/11/2010  . Smokeless tobacco: Never Used  . Alcohol Use: 0.0 oz/week    0 Standard drinks or equivalent per week     Comment: Occasional wine  . Drug Use: No  . Sexual Activity: Not on file   Other Topics Concern  . Not on file   Social History Narrative   Married 40 years.   Exercises, does walking tapes   Review of Systems  Appetite is fair Weight stable     Objective:   Physical Exam  Skin:  Left ~T5-6 lesions all crusted and healing  Assessment & Plan:

## 2014-09-01 NOTE — Assessment & Plan Note (Signed)
Suffering with this pain Not clearly helped by gabapentin but will try to increase the dose Tylenol also

## 2014-09-01 NOTE — Progress Notes (Signed)
Pre visit review using our clinic review tool, if applicable. No additional management support is needed unless otherwise documented below in the visit note. 

## 2014-09-07 ENCOUNTER — Encounter: Payer: Self-pay | Admitting: Gastroenterology

## 2014-09-12 ENCOUNTER — Encounter: Payer: Self-pay | Admitting: Gastroenterology

## 2014-09-29 ENCOUNTER — Encounter: Payer: Self-pay | Admitting: Cardiology

## 2014-10-06 ENCOUNTER — Encounter: Payer: Self-pay | Admitting: Internal Medicine

## 2014-11-07 ENCOUNTER — Ambulatory Visit
Admission: RE | Admit: 2014-11-07 | Discharge: 2014-11-07 | Disposition: A | Discharge status: Home / Self Care | Payer: Medicare Other | Source: Ambulatory Visit | Attending: Family Medicine | Admitting: Family Medicine

## 2014-11-07 ENCOUNTER — Other Ambulatory Visit: Payer: Self-pay | Admitting: Family Medicine

## 2014-11-07 DIAGNOSIS — Z1231 Encounter for screening mammogram for malignant neoplasm of breast: Secondary | ICD-10-CM | POA: Diagnosis not present

## 2014-11-07 LAB — HM MAMMOGRAPHY: HM Mammogram: NORMAL

## 2014-11-08 ENCOUNTER — Encounter: Payer: Self-pay | Admitting: *Deleted

## 2014-11-21 ENCOUNTER — Ambulatory Visit (INDEPENDENT_AMBULATORY_CARE_PROVIDER_SITE_OTHER): Payer: Medicare Other | Admitting: *Deleted

## 2014-11-21 ENCOUNTER — Encounter: Payer: Self-pay | Admitting: Internal Medicine

## 2014-11-21 DIAGNOSIS — I442 Atrioventricular block, complete: Secondary | ICD-10-CM | POA: Diagnosis not present

## 2014-11-21 NOTE — Progress Notes (Signed)
Remote pacemaker transmission.   

## 2014-11-29 LAB — CUP PACEART REMOTE DEVICE CHECK
Battery Remaining Percentage: 92 %
Brady Statistic RA Percent Paced: 18 %
Brady Statistic RV Percent Paced: 100 %
Lead Channel Pacing Threshold Amplitude: 0.6 V
Lead Channel Sensing Intrinsic Amplitude: 5 mV
Lead Channel Setting Pacing Amplitude: 2.4 V
Lead Channel Setting Sensing Sensitivity: 2.5 mV
MDC IDC MSMT BATTERY REMAINING LONGEVITY: 60 mo
MDC IDC MSMT LEADCHNL RA IMPEDANCE VALUE: 513 Ohm
MDC IDC MSMT LEADCHNL RA PACING THRESHOLD PULSEWIDTH: 0.5 ms
MDC IDC MSMT LEADCHNL RV IMPEDANCE VALUE: 426 Ohm
MDC IDC SESS DTM: 20160927053100
MDC IDC SET LEADCHNL RA PACING AMPLITUDE: 2 V
MDC IDC SET LEADCHNL RV PACING PULSEWIDTH: 0.5 ms
Pulse Gen Serial Number: 134132
Zone Setting Detection Interval: 375 ms

## 2014-12-11 ENCOUNTER — Other Ambulatory Visit: Payer: Self-pay | Admitting: Family Medicine

## 2014-12-11 MED ORDER — DOXYCYCLINE HYCLATE 100 MG PO TABS: 100.0000 mg | ORAL_TABLET | Freq: Two times a day (BID) | ORAL | Status: DC

## 2014-12-11 MED ORDER — ZOLPIDEM TARTRATE 10 MG PO TABS: ORAL_TABLET | ORAL | Status: DC

## 2014-12-11 NOTE — Telephone Encounter (Signed)
Ambien called in as prescribed  Pt said if you look in her chart she has this outbreak occisionally and usually it's resolved once you call her in an abx. Pt said that she has some small bumps around her vagina area, pt said the bumps are swollen, red, painful and some of them are oozing fluid, pt said they don't itch they are just painful and she is requesting that you send in what ever abx you have prescribed for this in the past

## 2014-12-11 NOTE — Telephone Encounter (Signed)
I will send doxycycline to [facility]  F/u if no improvement

## 2014-12-11 NOTE — Telephone Encounter (Signed)
Left voicemail letting pt know Rx sent and to f/u if no improvement

## 2014-12-11 NOTE — Telephone Encounter (Signed)
See prev note, zolpidem last refilled on 08/16/14 #30 with 3 additional refills

## 2014-12-11 NOTE — Telephone Encounter (Signed)
Patient is having a breakout in the vagina area again.  She was wanting an antibiotic again to get rid of it.  Also, patient stated the last time she went to the pharmacy to pick up her Zolpidem '10mg'$  tablets, she was told she had to contact the office for a refill.  So she needs this medication refilled.

## 2014-12-11 NOTE — Telephone Encounter (Signed)
Px written for call in  -Diana Cook  Is she talking about the doxycycline for infection / cysts or other?  - itching or pain ? Thanks

## 2014-12-18 ENCOUNTER — Telehealth: Payer: Self-pay

## 2014-12-18 NOTE — Telephone Encounter (Signed)
Pt made aware of transmission results. Notified that in the future her results should be delivered quickly as we have changed to a paperless system. Pt verbalizes understanding.

## 2014-12-18 NOTE — Telephone Encounter (Signed)
S/w pt who is inquiring of remote transmission results. She states she has to call every time for results then afterwards, she "finally" gets a letter in the mail. Informed pt I will forward note to [facility].

## 2014-12-18 NOTE — Telephone Encounter (Signed)
Pt would like monitor results. Please call. 

## 2015-01-03 ENCOUNTER — Encounter: Payer: Self-pay | Admitting: Cardiology

## 2015-01-16 ENCOUNTER — Telehealth: Payer: Self-pay

## 2015-01-16 ENCOUNTER — Ambulatory Visit (INDEPENDENT_AMBULATORY_CARE_PROVIDER_SITE_OTHER): Payer: Medicare Other

## 2015-01-16 DIAGNOSIS — Z23 Encounter for immunization: Secondary | ICD-10-CM

## 2015-01-16 NOTE — Telephone Encounter (Signed)
Pt will wait until it's been a month before getting vaccine but she isn't sure how the vaccine is covered so she will check with her insurance and if it's covered here she will schedule a nurse visit to get it and if it's covered at the pharmacy she will let us know so we can send in a Rx

## 2015-01-16 NOTE — Telephone Encounter (Signed)
Patient came in for a flu shot today. Patient wants to know when it would be okay to come in for her shingles vaccine. She states that at her last wellness visit in May, she was told to hold off on the shingles vaccine since her daughter was going through cancer treatment, and then she ended up getting the shingles in July. Patient wants to know if it is too soon to get the shingles vaccine, or if she could come in to get it soon?

## 2015-01-16 NOTE — Telephone Encounter (Signed)
I like to wait a month after the flu vaccine  Schedule nurse visit in a month- make sure she checks on coverage also

## 2015-01-22 DIAGNOSIS — L908 Other atrophic disorders of skin: Secondary | ICD-10-CM | POA: Diagnosis not present

## 2015-01-22 DIAGNOSIS — L720 Epidermal cyst: Secondary | ICD-10-CM | POA: Diagnosis not present

## 2015-02-12 DIAGNOSIS — D485 Neoplasm of uncertain behavior of skin: Secondary | ICD-10-CM | POA: Diagnosis not present

## 2015-02-12 DIAGNOSIS — L720 Epidermal cyst: Secondary | ICD-10-CM | POA: Diagnosis not present

## 2015-02-20 ENCOUNTER — Ambulatory Visit (INDEPENDENT_AMBULATORY_CARE_PROVIDER_SITE_OTHER): Payer: Medicare Other | Admitting: *Deleted

## 2015-02-20 DIAGNOSIS — I442 Atrioventricular block, complete: Secondary | ICD-10-CM | POA: Diagnosis not present

## 2015-02-20 NOTE — Progress Notes (Signed)
Remote pacemaker transmission.   

## 2015-02-25 DIAGNOSIS — Z923 Personal history of irradiation: Secondary | ICD-10-CM

## 2015-02-25 HISTORY — DX: Personal history of irradiation: Z92.3

## 2015-02-27 ENCOUNTER — Telehealth: Payer: Self-pay | Admitting: Internal Medicine

## 2015-02-27 NOTE — Telephone Encounter (Signed)
LMTCB//sss 

## 2015-02-27 NOTE — Telephone Encounter (Signed)
°  1. Has your device fired? no  2. Is you device beeping? no  3. Are you experiencing draining or swelling at device site? no  4. Are you calling to see if we received your device transmission? yes  5. Have you passed out? No  Want to know her results.If not at home please call her cell.

## 2015-02-28 NOTE — Telephone Encounter (Signed)
Follow up  ° ° °Patient returning call back to nurse  °

## 2015-02-28 NOTE — Telephone Encounter (Signed)
LMTCB//sss 

## 2015-03-02 DIAGNOSIS — H5340 Unspecified visual field defects: Secondary | ICD-10-CM | POA: Diagnosis not present

## 2015-03-02 DIAGNOSIS — Z9889 Other specified postprocedural states: Secondary | ICD-10-CM | POA: Diagnosis not present

## 2015-03-12 LAB — CUP PACEART REMOTE DEVICE CHECK
Battery Remaining Percentage: 85 %
Brady Statistic RA Percent Paced: 17 %
Implantable Lead Implant Date: 20130830
Implantable Lead Implant Date: 20130830
Implantable Lead Model: 4479
Implantable Lead Serial Number: 473325
Lead Channel Impedance Value: 428 Ohm
Lead Channel Sensing Intrinsic Amplitude: 5.1 mV
Lead Channel Setting Pacing Amplitude: 2.4 V
Lead Channel Setting Pacing Pulse Width: 0.5 ms
Lead Channel Setting Sensing Sensitivity: 2.5 mV
MDC IDC LEAD LOCATION: 753859
MDC IDC LEAD LOCATION: 753860
MDC IDC LEAD SERIAL: 523784
MDC IDC MSMT BATTERY REMAINING LONGEVITY: 54 mo
MDC IDC MSMT LEADCHNL RA IMPEDANCE VALUE: 479 Ohm
MDC IDC PG SERIAL: 134132
MDC IDC SESS DTM: 20161227125000
MDC IDC SET LEADCHNL RA PACING AMPLITUDE: 2 V
MDC IDC STAT BRADY RV PERCENT PACED: 100 %

## 2015-03-14 ENCOUNTER — Encounter: Payer: Self-pay | Admitting: Cardiology

## 2015-03-19 ENCOUNTER — Telehealth: Payer: Self-pay | Admitting: Internal Medicine

## 2015-03-19 NOTE — Telephone Encounter (Signed)
Returned patient's call.  Advised patient that remote transmission from 02/20/15 was within normal parameters for her and that she should be receiving a results letter in the mail soon.  Patient verbalizes appreciation and understanding and denies additional questions at this time.

## 2015-03-19 NOTE — Telephone Encounter (Signed)
°  1. Has your device fired? no  2. Is you device beeping? no  3. Are you experiencing draining or swelling at device site? no  4. Are you calling to see if we received your device transmission? no  5. Have you passed out? No   Pt want to know results of her device check.

## 2015-05-15 ENCOUNTER — Encounter: Payer: Medicare Other | Admitting: Internal Medicine

## 2015-05-22 ENCOUNTER — Encounter: Payer: Self-pay | Admitting: Family Medicine

## 2015-05-22 ENCOUNTER — Ambulatory Visit (INDEPENDENT_AMBULATORY_CARE_PROVIDER_SITE_OTHER): Payer: Medicare Other | Admitting: Family Medicine

## 2015-05-22 ENCOUNTER — Encounter: Payer: Self-pay | Admitting: *Deleted

## 2015-05-22 VITALS — BP 118/70 | HR 77 | Temp 97.9°F | Ht 68.0 in | Wt 162.5 lb

## 2015-05-22 DIAGNOSIS — Z79899 Other long term (current) drug therapy: Secondary | ICD-10-CM | POA: Diagnosis not present

## 2015-05-22 DIAGNOSIS — J069 Acute upper respiratory infection, unspecified: Secondary | ICD-10-CM

## 2015-05-22 DIAGNOSIS — B354 Tinea corporis: Secondary | ICD-10-CM | POA: Insufficient documentation

## 2015-05-22 DIAGNOSIS — B9789 Other viral agents as the cause of diseases classified elsewhere: Principal | ICD-10-CM

## 2015-05-22 MED ORDER — CLOTRIMAZOLE 1 % EX CREA: 1.0000 "application " | TOPICAL_CREAM | Freq: Two times a day (BID) | CUTANEOUS | Status: DC

## 2015-05-22 MED ORDER — BENZONATATE 200 MG PO CAPS: 200.0000 mg | ORAL_CAPSULE | Freq: Three times a day (TID) | ORAL | Status: DC | PRN

## 2015-05-22 NOTE — Progress Notes (Signed)
Subjective:    Patient ID: Diana Cook, female    DOB: [birth year], 65 y.o.   MRN: [id number]  HPI Here with upper respiratory symptoms   Woke up with cough and ST about a week ago  Sneezing a lot with runny nose , yellow nasal d/c  Cough -persistent -productive - a little yellow ST is better  Perhaps low grade temp on and off  Overall feeling better today   Her face/eyes and teeth hurt a bit  This has eased up some   Taking mucinex D and tylenol   Also has a rash on her back - itches a bit -wants to check that   Patient Active Problem List   Diagnosis Date Noted  . Post herpetic neuralgia 09/01/2014  . Encounter for Medicare annual wellness exam 07/11/2014  . Routine general medical examination at a health care facility 06/26/2013  . H/O small bowel obstruction 04/15/2013  . Dyspnea on exertion 02/10/2013  . Complete heart block ([place]) 11/03/2011  . [employer] 11/03/2011  . Lumbar spinal stenosis 09/02/2011  . Fibrocystic breast disease 08/12/2010  . POSTMENOPAUSAL STATUS 08/06/2009  . HIDRADENITIS SUPPURATIVA 06/26/2008  . HERNIATED DISC 12/14/2007  . SACROILIITIS, LEFT 12/02/2007  . BACK, LOWER, PAIN 12/02/2007  . HX, PERSONAL, COLONIC POLYPS 09/14/2006  . Former smoker 06/10/2006  . ASTHMA 06/10/2006  . H/O idiopathic seizure 06/10/2006   Past Medical History  Diagnosis Date  . Asthma     as a child, mild now  . Seizure disorder ([place])   . Colon polyps     colonoscopy 7/08, tubular adenoma  . Tobacco abuse   . Allergic rhinitis   . Complete heart block ([place]) 8/13    s/p PPM implanted in [place]   Past Surgical History  Procedure Laterality Date  . Cataract extraction    . Gyn surgery  1993    hysterectomy- form endometriosis  . Laparoscopy    . Small intestine surgery  12/2000    for blockage  . Pacemaker insertion  10/24/11    [employer] Advantio dual chamber PPM implanted by Dr Bunnie Philips at [facility]  in [place]  . Breast biopsy Right 11/2005    benign inflammatory changes   Social History  Substance Use Topics  . Smoking status: Former Smoker -- 0.40 packs/day for 40 years    Types: Cigarettes    Quit date: 11/11/2010  . Smokeless tobacco: Never Used  . Alcohol Use: 0.0 oz/week    0 Standard drinks or equivalent per week     Comment: Occasional wine   Family History  Problem Relation Age of Onset  . Stroke Mother   . Heart disease Mother     MI  . Dementia Mother   . Coronary artery disease Father   . Parkinsonism Father    Allergies  Allergen Reactions  . Codeine     REACTION: nausea  . Morphine And Related    Current Outpatient Prescriptions on File Prior to Visit  Medication Sig Dispense Refill  . acetaminophen (TYLENOL) 500 MG tablet Take 500 mg by mouth every 6 (six) hours as needed.    Marland Kitchen albuterol (PROVENTIL HFA;VENTOLIN HFA) 108 (90 BASE) MCG/ACT inhaler Inhale 2 puffs into the lungs every 6 (six) hours as needed for wheezing or shortness of breath. 1 Inhaler 2  . furosemide (LASIX) 20 MG tablet TAKE ONE TABLET BY MOUTH ONCE DAILY AS NEEDED 30 tablet 3  . zolpidem (AMBIEN)  10 MG tablet TAKE ONE-HALF TO ONE TABLET BY MOUTH AT BEDTIME AS NEEDED FOR SLEEP 30 tablet 3   No current facility-administered medications on file prior to visit.    Review of Systems  Constitutional: Positive for appetite change and fatigue. Negative for fever.  HENT: Positive for congestion, postnasal drip, rhinorrhea, sinus pressure, sneezing and sore throat. Negative for ear pain.   Eyes: Negative for pain and discharge.  Respiratory: Positive for cough. Negative for shortness of breath, wheezing and stridor.   Cardiovascular: Negative for chest pain.  Gastrointestinal: Negative for nausea, vomiting and diarrhea.  Genitourinary: Negative for urgency, frequency and hematuria.  Musculoskeletal: Negative for myalgias and arthralgias.  Skin: Negative for rash.  Neurological: Positive  for headaches. Negative for dizziness, weakness and light-headedness.  Psychiatric/Behavioral: Negative for confusion and dysphoric mood.       Objective:   Physical Exam  Constitutional: She appears well-developed and well-nourished. No distress.  HENT:  Head: Normocephalic and atraumatic.  Right Ear: External ear normal.  Left Ear: External ear normal.  Mouth/Throat: Oropharynx is clear and moist.  Nares are injected and congested  No sinus tenderness Clear rhinorrhea and post nasal drip   Eyes: Conjunctivae and EOM are normal. Pupils are equal, round, and reactive to light. Right eye exhibits no discharge. Left eye exhibits no discharge.  Neck: Normal range of motion. Neck supple.  Cardiovascular: Normal rate and normal heart sounds.   Pulmonary/Chest: Effort normal and breath sounds normal. No respiratory distress. She has no wheezes. She has no rales. She exhibits no tenderness.  Good air exch  No rales or rhonchi  Scant wheeze on forced exp only  Lymphadenopathy:    She has no cervical adenopathy.  Neurological: She is alert.  Skin: Skin is warm and dry. No rash noted.  2-3 cm round area on R upper back with pink scale and central clearing -resembles ringworm No excoriation or central clearing   Psychiatric: She has a normal mood and affect.  Cheerful           Assessment & Plan:   Problem List Items Addressed This Visit      Respiratory   Viral URI with cough - Primary    In former smoker - clinically improved today Reassuring exam Disc symptomatic care - see instructions on AVS  Also tessalon prn for cough   Update if not starting to improve in a week or if worsening  -esp if sinus pain or sob/wheeze       Relevant Medications   clotrimazole (LOTRIMIN) 1 % cream     Musculoskeletal and Integument   Ringworm of body    R upper back  Px lotrimin (or otc) to use bid  Keep clean and dry  Update if no imp in 2 weeks       Relevant Medications    clotrimazole (LOTRIMIN) 1 % cream

## 2015-05-22 NOTE — Assessment & Plan Note (Signed)
In former smoker - clinically improved today Reassuring exam Disc symptomatic care - see instructions on AVS  Also tessalon prn for cough   Update if not starting to improve in a week or if worsening  -esp if sinus pain or sob/wheeze

## 2015-05-22 NOTE — Progress Notes (Signed)
Pre visit review using our clinic review tool, if applicable. No additional management support is needed unless otherwise documented below in the visit note. 

## 2015-05-22 NOTE — Patient Instructions (Addendum)
I think you have a viral upper respiratory infection with cough  I'm glad it is getting better today  Alert me if the sinus pain returns-please let me know  Try mucinex DM for cough instead of the D  Drink lots of fluids  Try zyrtec for runny nose if you need it  Also nasal saline spray for congestion  Tessalon px for cough - I sent to the pharmacy   Keep the rash clean and dry and use cream twice daily  If no improvement in 2 weeks let me know    Update if not starting to improve in a week or if worsening  -especially if sinus pain or wheezing or shortness of breath

## 2015-05-22 NOTE — Assessment & Plan Note (Signed)
R upper back  Px lotrimin (or otc) to use bid  Keep clean and dry  Update if no imp in 2 weeks

## 2015-06-06 ENCOUNTER — Encounter: Payer: Self-pay | Admitting: Family Medicine

## 2015-06-12 ENCOUNTER — Encounter: Payer: Self-pay | Admitting: Internal Medicine

## 2015-06-12 ENCOUNTER — Ambulatory Visit (INDEPENDENT_AMBULATORY_CARE_PROVIDER_SITE_OTHER): Payer: Medicare Other | Admitting: Internal Medicine

## 2015-06-12 VITALS — BP 104/60 | HR 65 | Ht 68.5 in | Wt 161.5 lb

## 2015-06-12 DIAGNOSIS — I442 Atrioventricular block, complete: Secondary | ICD-10-CM | POA: Diagnosis not present

## 2015-06-12 NOTE — Progress Notes (Signed)
      Patient Care Team: Abner Greenspan, MD as PCP - General   HPI  Diana Cook is a 65 y.o. female Seen by both Drs. Allred and Lovena Le in the past who developed complete heart block and underwent pacing fall 2013.  Catheterization reportedly demonstrated no coronary artery disease  Echocardiogram 9/13 demonstrated normal left ventricular ; this was repeated 9/14 with normal LV function  At her last office visit we reprogrammed rate response. She has been much better since then  Her daughter a year ago developed cancer. Side effects from chemotherapy at left her bedbound for the last year      Past Medical History  Diagnosis Date  . Asthma     as a child, mild now  . Seizure disorder ([place])   . Colon polyps     colonoscopy 7/08, tubular adenoma  . Tobacco abuse   . Allergic rhinitis   . Complete heart block ([place]) 8/13    s/p PPM implanted in [place]    Past Surgical History  Procedure Laterality Date  . Cataract extraction    . Gyn surgery  1993    hysterectomy- form endometriosis  . Laparoscopy    . Small intestine surgery  12/2000    for blockage  . Pacemaker insertion  10/24/11    [employer] Advantio dual chamber PPM implanted by Dr Bunnie Philips at [facility] in [place]  . Breast biopsy Right 11/2005    benign inflammatory changes    Current Outpatient Prescriptions  Medication Sig Dispense Refill  . acetaminophen (TYLENOL) 500 MG tablet Take 500 mg by mouth every 6 (six) hours as needed.    Marland Kitchen albuterol (PROVENTIL HFA;VENTOLIN HFA) 108 (90 BASE) MCG/ACT inhaler Inhale 2 puffs into the lungs every 6 (six) hours as needed for wheezing or shortness of breath. 1 Inhaler 2  . Calcium-Magnesium-Vitamin D (CALCIUM 500 PO) Take 2 capsules by mouth 2 (two) times daily.    . furosemide (LASIX) 20 MG tablet TAKE ONE TABLET BY MOUTH ONCE DAILY AS NEEDED 30 tablet 3  . zolpidem (AMBIEN) 10 MG tablet TAKE ONE-HALF TO ONE TABLET BY MOUTH AT  BEDTIME AS NEEDED FOR SLEEP 30 tablet 3   No current facility-administered medications for this visit.    Allergies  Allergen Reactions  . Codeine     REACTION: nausea  . Morphine And Related     Nausea     Review of Systems negative except from HPI and PMH  Physical Exam BP 104/60 mmHg  Pulse 65  Ht 5' 8.5" (1.74 m)  Wt 161 lb 8 oz (73.256 kg)  BMI 24.20 kg/m2 Well developed and well nourished in no acute distress HENT normal E scleral and icterus clear Neck Supple JVP flat; carotids brisk and full Clear to ausculation ,Regular rate and rhythm, no murmurs gallops or rub Soft with active bowel sounds No clubbing cyanosis  Edema Alert and oriented, grossly normal motor and sensory function Skin Warm and Dry  ECG demonstrates P synchronous pacing  Assessment and  Plan  High-grade heart block  Pacemaker-[employer] The patient's device was interrogated.  The information was reviewed. No changes were made in the programming.    Sinus node dysfunction and chronotropic incompetence     Overall she is doing much better. No adjustments at this time

## 2015-06-12 NOTE — Patient Instructions (Signed)
Medication Instructions: - Your physician recommends that you continue on your current medications as directed. Please refer to the Current Medication list given to you today.  Labwork: - none  Procedures/Testing: - none  Follow-Up: - Remote monitoring is used to monitor your Pacemaker of ICD from home. This monitoring reduces the number of office visits required to check your device to one time per year. It allows [place] to keep an eye on the functioning of your device to ensure it is working properly. You are scheduled for a device check from home on 09/11/15. You may send your transmission at any time that day. If you have a wireless device, the transmission will be sent automatically. After your physician reviews your transmission, you will receive a postcard with your next transmission date.  - Your physician wants you to follow-up in: 1 year with Dr. Caryl Comes. You will receive a reminder letter in the mail two months in advance. If you don't receive a letter, please call our office to schedule the follow-up appointment.  Any Additional Special Instructions Will Be Listed Below (If Applicable).     If you need a refill on your cardiac medications before your next appointment, please call your pharmacy.

## 2015-06-13 ENCOUNTER — Other Ambulatory Visit: Payer: Self-pay | Admitting: Cardiovascular Disease

## 2015-06-22 ENCOUNTER — Telehealth: Payer: Self-pay | Admitting: Internal Medicine

## 2015-06-22 ENCOUNTER — Encounter: Payer: Self-pay | Admitting: *Deleted

## 2015-06-22 NOTE — Telephone Encounter (Signed)
Patient called back with fax number to send letter   [phone] .  Please call cell when faxed [phone]

## 2015-06-22 NOTE — Telephone Encounter (Signed)
I left message for the patient on her home and cell # to call. I advised her on her cell voice mail (identified) that she may proceed, but to call with fax #.

## 2015-06-22 NOTE — Telephone Encounter (Signed)
Pt calling stating she would like to get an Okay to get Permanent tattoo on her eye as eye liner The artist will not do it until she gets the okay from [place]  Pt will call back and give [place] a fax number where we can send this to [place] Please advise

## 2015-06-22 NOTE — Telephone Encounter (Signed)
The patient is aware she may proceed with permanent tattoo eye liner. Fax # re-confirmed with the patient. Letter faxed to [phone]. Confirmation received.

## 2015-06-26 ENCOUNTER — Encounter: Payer: Self-pay | Admitting: Internal Medicine

## 2015-07-16 LAB — CUP PACEART INCLINIC DEVICE CHECK
Brady Statistic RA Percent Paced: 16 %
Brady Statistic RV Percent Paced: 100 %
Implantable Lead Implant Date: 20130830
Implantable Lead Location: 753860
Implantable Lead Model: 4456
Implantable Lead Serial Number: 473325
Implantable Lead Serial Number: 523784
Lead Channel Impedance Value: 545 Ohm
Lead Channel Pacing Threshold Pulse Width: 0.5 ms
Lead Channel Pacing Threshold Pulse Width: 0.5 ms
Lead Channel Setting Pacing Amplitude: 2 V
Lead Channel Setting Pacing Amplitude: 2.4 V
MDC IDC LEAD IMPLANT DT: 20130830
MDC IDC LEAD LOCATION: 753859
MDC IDC MSMT LEADCHNL RA PACING THRESHOLD AMPLITUDE: 0.8 V
MDC IDC MSMT LEADCHNL RA SENSING INTR AMPL: 4.3 mV
MDC IDC MSMT LEADCHNL RV IMPEDANCE VALUE: 470 Ohm
MDC IDC MSMT LEADCHNL RV PACING THRESHOLD AMPLITUDE: 0.4 V
MDC IDC PG SERIAL: 134132
MDC IDC SESS DTM: 20170418040000
MDC IDC SET LEADCHNL RV PACING PULSEWIDTH: 0.5 ms
MDC IDC SET LEADCHNL RV SENSING SENSITIVITY: 2.5 mV

## 2015-07-20 ENCOUNTER — Ambulatory Visit: Payer: Medicare Other

## 2015-07-26 ENCOUNTER — Ambulatory Visit (INDEPENDENT_AMBULATORY_CARE_PROVIDER_SITE_OTHER): Payer: Medicare Other

## 2015-07-26 ENCOUNTER — Other Ambulatory Visit: Payer: Self-pay

## 2015-07-26 VITALS — BP 108/68 | HR 66 | Temp 98.6°F | Ht 68.0 in | Wt 164.0 lb

## 2015-07-26 DIAGNOSIS — Z23 Encounter for immunization: Secondary | ICD-10-CM

## 2015-07-26 DIAGNOSIS — Z0000 Encounter for general adult medical examination without abnormal findings: Secondary | ICD-10-CM

## 2015-07-26 DIAGNOSIS — Z1159 Encounter for screening for other viral diseases: Secondary | ICD-10-CM

## 2015-07-26 MED ORDER — ZOLPIDEM TARTRATE 10 MG PO TABS: ORAL_TABLET | ORAL | Status: DC

## 2015-07-26 MED ORDER — ALBUTEROL SULFATE HFA 108 (90 BASE) MCG/ACT IN AERS: 2.0000 | INHALATION_SPRAY | Freq: Four times a day (QID) | RESPIRATORY_TRACT | Status: DC | PRN

## 2015-07-26 NOTE — Telephone Encounter (Signed)
Px written for call in  For ambien Albuterol sent electronically Tell her to bring the tube of retin A to her visit 6/12 so I can take a look at the strength and directions

## 2015-07-26 NOTE — Telephone Encounter (Signed)
Rx called in as prescribed and pt advise to bring in Retin-A cream to CPE, pt verbalized understanding

## 2015-07-26 NOTE — Progress Notes (Signed)
I reviewed health advisor's note, was available for consultation, and agree with documentation and plan.  

## 2015-07-26 NOTE — Patient Instructions (Signed)
Ms. Michaelson , Thank you for taking time to come for your Medicare Wellness Visit. I appreciate your ongoing commitment to your health goals. Please review the following plan we discussed and let me know if I can assist you in the future.   These are the goals we discussed: Goals    . Increase physical activity     Starting 07/26/2015, I will continue to walk for at least 45 min 3-5 days per week.        This is a list of the screening recommended for you and due dates:  Health Maintenance  Topic Date Due  . Shingles Vaccine  08/15/2015*  .  Hepatitis C: One time screening is recommended by Center for Disease Control  (CDC) for  adults born from [birth year] through 1967.   08/15/2015*  . Flu Shot  09/25/2015  . Mammogram  11/07/2015  . Tetanus Vaccine  06/13/2017  . Colon Cancer Screening  10/04/2020  . DEXA scan (bone density measurement)  Completed  . Pneumonia vaccines  Completed  *Topic was postponed. The date shown is not the original due date.   Preventive Care for Adults  A healthy lifestyle and preventive care can promote health and wellness. Preventive health guidelines for adults include the following key practices.  . A routine yearly physical is a good way to check with your health care provider about your health and preventive screening. It is a chance to share any concerns and updates on your health and to receive a thorough exam.  . Visit your dentist for a routine exam and preventive care every 6 months. Brush your teeth twice a day and floss once a day. Good oral hygiene prevents tooth decay and gum disease.  . The frequency of eye exams is based on your age, health, family medical history, use  of contact lenses, and other factors. Follow your health care provider's ecommendations for frequency of eye exams.  . Eat a healthy diet. Foods like vegetables, fruits, whole grains, low-fat dairy products, and lean protein foods contain the nutrients you need without too many  calories. Decrease your intake of foods high in solid fats, added sugars, and salt. Eat the right amount of calories for you. Get information about a proper diet from your health care provider, if necessary.  . Regular physical exercise is one of the most important things you can do for your health. Most adults should get at least 150 minutes of moderate-intensity exercise (any activity that increases your heart rate and causes you to sweat) each week. In addition, most adults need muscle-strengthening exercises on 2 or more days a week.  Silver Sneakers may be a benefit available to you. To determine eligibility, you may visit the website: www.silversneakers.com or contact program at (320)273-9870 Mon-Fri between 8AM-8PM.   . Maintain a healthy weight. The body mass index (BMI) is a screening tool to identify possible weight problems. It provides an estimate of body fat based on height and weight. Your health care provider can find your BMI and can help you achieve or maintain a healthy weight.   For adults 20 years and older: ? A BMI below 18.5 is considered underweight. ? A BMI of 18.5 to 24.9 is normal. ? A BMI of 25 to 29.9 is considered overweight. ? A BMI of 30 and above is considered obese.   . Maintain normal blood lipids and cholesterol levels by exercising and minimizing your intake of saturated fat. Eat a balanced diet with plenty  of fruit and vegetables. Blood tests for lipids and cholesterol should begin at age 90 and be repeated every 5 years. If your lipid or cholesterol levels are high, you are over 50, or you are at high risk for heart disease, you may need your cholesterol levels checked more frequently. Ongoing high lipid and cholesterol levels should be treated with medicines if diet and exercise are not working.  . If you smoke, find out from your health care provider how to quit. If you do not use tobacco, please do not start.  . If you choose to drink alcohol, please do  not consume more than 2 drinks per day. One drink is considered to be 12 ounces (355 mL) of beer, 5 ounces (148 mL) of wine, or 1.5 ounces (44 mL) of liquor.  . If you are 9-35 years old, ask your health care provider if you should take aspirin to prevent strokes.  . Use sunscreen. Apply sunscreen liberally and repeatedly throughout the day. You should seek shade when your shadow is shorter than you. Protect yourself by wearing long sleeves, pants, a wide-brimmed hat, and sunglasses year round, whenever you are outdoors.  . Once a month, do a whole body skin exam, using a mirror to look at the skin on your back. Tell your health care provider of new moles, moles that have irregular borders, moles that are larger than a pencil eraser, or moles that have changed in shape or color.

## 2015-07-26 NOTE — Telephone Encounter (Signed)
Dr. Glori Bickers,  Pt was in today for AWV. Pt has requested refills for Ambien and Proventil HFA. Scheduled a woman exam with same days labs for 08/06/15.  Pt has requested a new prescription for the Retin-A cream. She has been using a tube of this medication that was given to her by a friend.   Thanks, Katha Cabal

## 2015-07-26 NOTE — Progress Notes (Signed)
PCP notes:  Health maintenance:  Hep C screening - future order generated PPSV23 - administered Shingles - postponed/insurance  Abnormal screenings:  Cognitive - Mini-Cog score 19/20  Patient concerns: Pt needs refills. Requested .   Nurse concerns: none  Next PCP appt: 08/15/15 @ 1215

## 2015-07-26 NOTE — Progress Notes (Signed)
Pre visit review using our clinic review tool, if applicable. No additional management support is needed unless otherwise documented below in the visit note. 

## 2015-07-26 NOTE — Progress Notes (Signed)
Subjective:   Diana Cook is a 65 y.o. female who presents for Medicare Annual (Subsequent) preventive examination.  Review of Systems:  N/A Cardiac Risk Factors include: advanced age (>40mn, >>29women)     Objective:     Vitals: BP 108/68 mmHg  Pulse 66  Temp(Src) 98.6 F (37 C) (Oral)  Ht '5\' 8"'$  (1.727 m)  Wt 164 lb (74.39 kg)  BMI 24.94 kg/m2  SpO2 96%  Body mass index is 24.94 kg/(m^2).   Tobacco History  Smoking status  . Former Smoker -- 0.40 packs/day for 40 years  . Types: Cigarettes  . Quit date: 11/11/2010  Smokeless tobacco  . Never Used     Counseling given: No   Past Medical History  Diagnosis Date  . Asthma     as a child, mild now  . Seizure disorder ([place]   . Colon polyps     colonoscopy 7/08, tubular adenoma  . Tobacco abuse   . Allergic rhinitis   . [place] heart block [place] 8[place]13    [place]  Past Surgical History  Procedure Laterality Date  . Cataract extraction    . Gyn surgery  1993    hysterectomy- form endometriosis  . Laparoscopy    . Small intestine surgery  12/2000    for blockage  . Pacemaker insertion  10/24/11    [employer] Advantio dual chamber PPM [facility] [place] . Breast biopsy Right 11/2005    benign inflammatory changes   Family History  Problem Relation Age of Onset  . Stroke Mother   . Heart disease Mother     MI  . Dementia Mother   . Coronary artery disease Father   . Parkinsonism Father    History  Sexual Activity  . Sexual Activity: No    Outpatient Encounter Prescriptions as of 07/26/2015  Medication Sig  . acetaminophen (TYLENOL) 500 MG tablet Take 500 mg by mouth every 6 (six) hours as needed.  .Marland Kitchenalbuterol (PROVENTIL HFA;VENTOLIN HFA) 108 (90 BASE) MCG/ACT inhaler Inhale 2 puffs into the lungs every 6 (six) hours as needed for wheezing or shortness of breath.  . Calcium-Magnesium-Vitamin D (CALCIUM 500  PO) Take 2 capsules by mouth 2 (two) times daily.  . furosemide (LASIX) 20 MG tablet TAKE ONE TABLET BY MOUTH ONCE DAILY AS NEEDED  . tretinoin (RETIN-A) 0.1 % cream Apply topically 3 (three) times a week.  . zolpidem (AMBIEN) 10 MG tablet TAKE ONE-HALF TO ONE TABLET BY MOUTH AT BEDTIME AS NEEDED FOR SLEEP   No facility-administered encounter medications on file as of 07/26/2015.    Activities of Daily Living In your present state of health, do you have any difficulty performing the following activities: 07/26/2015  Hearing? N  Vision? N  Difficulty concentrating or making decisions? N  Walking or climbing stairs? N  Dressing or bathing? N  Doing errands, shopping? N  Preparing Food and eating ? N  Using the Toilet? N  In the past six months, have you accidently leaked urine? N  Do you have problems with loss of bowel control? N  Managing your Medications? N  Managing your Finances? N  Housekeeping or managing your Housekeeping? N    Patient Care Team: MAbner Greenspan MD as PCP - General SDeboraha Sprang MD as Consulting Physician (Cardiology) PCrista Curb BGloriann Loan MD as Referring Physician (Ophthalmology)    Assessment:  Hearing Screening   '125Hz'$  '250Hz'$  '500Hz'$  '1000Hz'$  '2000Hz'$  '4000Hz'$  '8000Hz'$   Right ear:   40 40 40 40   Left ear:   40 40 40 40   Vision Screening Comments: Last eye exam approx. 2 yrs ago with Dr. Gloriann Loan   Exercise Activities and Dietary recommendations Current Exercise Habits: Home exercise routine, Type of exercise: walking, Time (Minutes): 45, Frequency (Times/Week): 5, Weekly Exercise (Minutes/Week): 225, Intensity: Moderate, Exercise limited by: None identified  Goals    . Increase physical activity     Starting 07/26/2015, I will continue to walk for at least 45 min 3-5 days per week.       Fall Risk Fall Risk  07/26/2015 07/11/2014  Falls in the past year? No No   Depression Screen PHQ 2/9 Scores 07/26/2015 07/11/2014  PHQ - 2 Score 0 0     Cognitive  Testing MMSE - Mini Mental State Exam 07/26/2015  Orientation to time 5  Orientation to Place 5  Registration 3  Attention/ Calculation 0  Recall 2  Recall-comments pt was unable to recall 1 of 3 words  Language- name 2 objects 0  Language- repeat 1  Language- follow 3 step command 3  Language- read & follow direction 0  Write a sentence 0  Copy design 0  Total score 19   PLEASE NOTE: A Mini-Cog screen was completed. Maximum score is 20. A value of 0 denotes this part of [place] MMSE was not completed or the patient failed this part of the Mini-Cog screening.   Mini-Cog Screening Orientation to Time - Max 5 pts Orientation to Place - Max 5 pts Registration - Max 3 pts Recall - Max 3 pts Language Repeat - Max 1 pts Language Follow 3 Step Command - Max 3 pts   Immunization History  Administered Date(s) Administered  . Influenza Whole 02/24/2005  . Influenza,inj,Quad PF,36+ Mos 12/03/2012, 12/09/2013, 01/16/2015  . Pneumococcal Conjugate-13 07/11/2014  . Pneumococcal Polysaccharide-23 03/21/2005, 07/26/2015  . Td 01/09/1999, 06/14/2007   Screening Tests Health Maintenance  Topic Date Due  . ZOSTAVAX  08/15/2015 (Originally 07/26/2008)  . Hepatitis C Screening  08/15/2015 (Originally 1951/02/04)  . INFLUENZA VACCINE  09/25/2015  . MAMMOGRAM  11/07/2015  . TETANUS/TDAP  06/13/2017  . COLONOSCOPY  10/04/2020  . DEXA SCAN  Completed  . PNA vac Low Risk Adult  Completed      Plan:  I have personally reviewed and addressed the Medicare Annual Wellness questionnaire and have noted the following in the patient's chart:  A. Medical and social history B. Use of alcohol, tobacco or illicit drugs  C. Current medications and supplements D. Functional ability and status E.  Nutritional status F.  Physical activity G. Advance directives H. List of other physicians I.  Hospitalizations, surgeries, and ER visits in previous 12 months J.  [place] to include hearing,  vision, cognitive, depression L. Referrals and appointments - none  In addition, I have reviewed and discussed with patient certain preventive protocols, quality metrics, and best practice recommendations. A written personalized care plan for preventive services as well as general preventive health recommendations were provided to patient.  See attached scanned questionnaire for additional information.   Signed,   Lindell Noe, MHA, BS, LPN Health Advisor

## 2015-08-15 ENCOUNTER — Other Ambulatory Visit: Payer: Medicare Other

## 2015-08-15 ENCOUNTER — Ambulatory Visit: Payer: Medicare Other | Admitting: Family Medicine

## 2015-09-05 ENCOUNTER — Telehealth: Payer: Self-pay | Admitting: Family Medicine

## 2015-09-05 DIAGNOSIS — Z0000 Encounter for general adult medical examination without abnormal findings: Secondary | ICD-10-CM

## 2015-09-05 NOTE — Telephone Encounter (Signed)
-----   Message from Ellamae Sia sent at 09/04/2015 10:18 AM EDT ----- Regarding: Lab orders for Friday, 7.14.17 Patient is scheduled for CPX labs, please order future labs, Thanks , [name]

## 2015-09-05 NOTE — Telephone Encounter (Signed)
Diana Cook It looks like this pt has medicare with a supplement (not a medicare advantage plan) I tried to order labs and the computer told me that medicare would not pay She does not have much in terms of dx codes to pay for any of it Please let her know  Might as well cancel the lab appt and then I can disc it with her when she comes in  ? Perhaps her ins has changed?-that would be helpful

## 2015-09-07 ENCOUNTER — Telehealth: Payer: Self-pay | Admitting: Gastroenterology

## 2015-09-07 ENCOUNTER — Other Ambulatory Visit: Payer: Self-pay | Admitting: Family Medicine

## 2015-09-07 ENCOUNTER — Other Ambulatory Visit: Payer: Medicare Other

## 2015-09-07 ENCOUNTER — Ambulatory Visit (INDEPENDENT_AMBULATORY_CARE_PROVIDER_SITE_OTHER): Payer: Medicare Other | Admitting: Family Medicine

## 2015-09-07 ENCOUNTER — Encounter: Payer: Self-pay | Admitting: Gastroenterology

## 2015-09-07 ENCOUNTER — Encounter: Payer: Self-pay | Admitting: Family Medicine

## 2015-09-07 VITALS — BP 108/60 | HR 60 | Temp 97.5°F | Ht 68.0 in | Wt 165.0 lb

## 2015-09-07 DIAGNOSIS — Z860100 Personal history of colon polyps, unspecified: Secondary | ICD-10-CM

## 2015-09-07 DIAGNOSIS — I442 Atrioventricular block, complete: Secondary | ICD-10-CM

## 2015-09-07 DIAGNOSIS — T148XXA Other injury of unspecified body region, initial encounter: Secondary | ICD-10-CM

## 2015-09-07 DIAGNOSIS — Z1231 Encounter for screening mammogram for malignant neoplasm of breast: Secondary | ICD-10-CM

## 2015-09-07 DIAGNOSIS — T148 Other injury of unspecified body region: Secondary | ICD-10-CM

## 2015-09-07 DIAGNOSIS — Z23 Encounter for immunization: Secondary | ICD-10-CM

## 2015-09-07 DIAGNOSIS — E2839 Other primary ovarian failure: Secondary | ICD-10-CM

## 2015-09-07 DIAGNOSIS — L908 Other atrophic disorders of skin: Secondary | ICD-10-CM

## 2015-09-07 DIAGNOSIS — L988 Other specified disorders of the skin and subcutaneous tissue: Secondary | ICD-10-CM | POA: Insufficient documentation

## 2015-09-07 LAB — CBC WITH DIFFERENTIAL/PLATELET
BASOS ABS: 0 10*3/uL (ref 0.0–0.1)
Basophils Relative: 0.5 % (ref 0.0–3.0)
EOS PCT: 1.7 % (ref 0.0–5.0)
Eosinophils Absolute: 0.1 10*3/uL (ref 0.0–0.7)
HCT: 39.4 % (ref 36.0–46.0)
HEMOGLOBIN: 13.1 g/dL (ref 12.0–15.0)
Lymphocytes Relative: 24.3 % (ref 12.0–46.0)
Lymphs Abs: 1.7 10*3/uL (ref 0.7–4.0)
MCHC: 33.2 g/dL (ref 30.0–36.0)
MCV: 89.6 fl (ref 78.0–100.0)
MONOS PCT: 9.5 % (ref 3.0–12.0)
Monocytes Absolute: 0.7 10*3/uL (ref 0.1–1.0)
Neutro Abs: 4.4 10*3/uL (ref 1.4–7.7)
Neutrophils Relative %: 64 % (ref 43.0–77.0)
Platelets: 271 10*3/uL (ref 150.0–400.0)
RBC: 4.4 Mil/uL (ref 3.87–5.11)
RDW: 13.2 % (ref 11.5–15.5)
WBC: 6.8 10*3/uL (ref 4.0–10.5)

## 2015-09-07 MED ORDER — TRETINOIN 0.1 % EX CREA: TOPICAL_CREAM | Freq: Every day | CUTANEOUS | Status: DC

## 2015-09-07 NOTE — Telephone Encounter (Signed)
Pt notified to call and set up colon now for 2011 polyps.

## 2015-09-07 NOTE — Telephone Encounter (Signed)
CLINICAL HISTORY  SPECIMEN(S) OBTAINED 1. Colon, polyp(s), Descending, Sigmoid, Rectum  SPECIMEN COMMENTS: 1. Polypi neoplasm surveillance colonoscopy  Gross Description 1. Received in formalin are four tan red polyps ranging from 0.3 to 1.3 cm. The largest is bisected, and the specimens are entirely submitted in one block.  Sw/Eps 10/01/09  MICROSCOPIC DESCRIPTION  CASE COMMENTS   Document Description: Append Order Summary: [place]  recall colonoscopy in 5 years        Specimen Collected: 10/03/09 12:28 PM        Dr Ardis Hughs do you want her scheduled for colon now?

## 2015-09-07 NOTE — Patient Instructions (Addendum)
Get back on calcium and vitamin D for your bones Stop at check out for referral for bone density test and colonoscopy  Take care of yourself  Careful with exercise outdoors regarding breathing Lab today for easy bruising (cbc with platelets)  Shingles vaccine today

## 2015-09-07 NOTE — Telephone Encounter (Signed)
Yes, she needs recall colonoscopy now for 2011 adenomatous polyp.  tahnks

## 2015-09-07 NOTE — Progress Notes (Signed)
Pre visit review using our clinic review tool, if applicable. No additional management support is needed unless otherwise documented below in the visit note. 

## 2015-09-07 NOTE — Progress Notes (Signed)
Subjective:    Patient ID: Diana Cook, female    DOB: 1951-01-03, 65 y.o.   MRN: [id number]  HPI Here for annual f/u of acute and chronic medical problems   Had AMW with Lesia on 6/1-overall doing well  Given PPSV23 Hep C screening -declines/not high risk   Zoster vaccine-discussed with Katha Cabal  On husb ins and his was paid for here  Wants to get it today   dexa 3/08-would like to get one this year   Perhaps a little bone loss on last one/unsure Has lost some ht Used to smoke  No falls  No broken bones Was taking ca and D= held it due to bruising/wondered if that caused it   Easy bruising- has not had labs    Mammogram 9/16 Normal Self breast exam - no lumps  Years ago had breast bx (benign area) - scar area of R breast has been hurting  Does wear under wire bra   Colonoscopy 8/12 Adenomatous polyps - 5 year recall  Nothing in the mail  Wants to set it up    Brought a sample of tretinoin cream 0.1 % for wrinkles  Wants refill  Uses sun[employer]ree[employer]  Knows ins will not cover it   Patient Active Problem List   Diagnosis Date Noted  . Estrogen deficiency 09/07/2015  . Bruising 09/07/2015  . Wrinkles 09/07/2015  . Ringworm of body 05/22/2015  . Post herpetic neuralgia 09/01/2014  . Encounter for Medicare annual wellness exam 07/11/2014  . Routine general medical examination at a health care facility 06/26/2013  . H/O small bowel obstruction 04/15/2013  . Dyspnea on exertion 02/10/2013  . Complete heart block ([place]) 11/03/2011  . [employer] 11/03/2011  . Lumbar spinal stenosis 09/02/2011  . Fibrocystic breast disease 08/12/2010  . POSTMENOPAUSAL STATUS 08/06/2009  . HIDRADENITIS SUPPURATIVA 06/26/2008  . HERNIATED DISC 12/14/2007  . SACROILIITIS, LEFT 12/02/2007  . BACK, LOWER, PAIN 12/02/2007  . HX, PERSONAL, COLONIC POLYPS 09/14/2006  . Former smoker 06/10/2006  . ASTHMA 06/10/2006  . H/O idiopathic seizure 06/10/2006   Past Medical  History  Diagnosis Date  . Asthma     as a child, mild now  . Seizure disorder ([place])   . Colon polyps     colonoscopy 7/08, tubular adenoma  . Tobacco abuse   . Allergic rhinitis   . Complete heart block ([place]) 8/13    s/p PPM implanted in [place]   Past Surgical History  Procedure Laterality Date  . Cataract extraction    . Gyn surgery  1993    hysterectomy- form endometriosis  . Laparoscopy    . Small intestine surgery  12/2000    for blockage  . Pacemaker insertion  10/24/11    [employer] Advantio dual chamber PPM implanted by Dr Bunnie Philips at [facility] in [place]  . Breast biopsy Right 11/2005    benign inflammatory changes   Social History  Substance Use Topics  . Smoking status: Former Smoker -- 0.40 packs/day for 40 years    Types: Cigarettes    Quit date: 11/11/2010  . Smokeless tobacco: Never Used  . Alcohol Use: 0.0 oz/week    0 Standard drinks or equivalent per week     Comment: wine (pt said it's very occ)   Family History  Problem Relation Age of Onset  . Stroke Mother   . Heart disease Mother     MI  . Dementia Mother   .  Coronary artery disease Father   . Parkinsonism Father    Allergies  Allergen Reactions  . Codeine     REACTION: nausea  . Morphine And Related     Nausea    Current Outpatient Prescriptions on File Prior to Visit  Medication Sig Dispense Refill  . acetaminophen (TYLENOL) 500 MG tablet Take 500 mg by mouth every 6 (six) hours as needed.    Marland Kitchen albuterol (PROVENTIL HFA;VENTOLIN HFA) 108 (90 Base) MCG/ACT inhaler Inhale 2 puffs into the lungs every 6 (six) hours as needed for wheezing or shortness of breath. 1 Inhaler 11  . Calcium-Magnesium-Vitamin D (CALCIUM 500 PO) Take 2 capsules by mouth 2 (two) times daily.    . furosemide (LASIX) 20 MG tablet TAKE ONE TABLET BY MOUTH ONCE DAILY AS NEEDED 30 tablet 3  . tretinoin (RETIN-A) 0.1 % cream Apply topically 3 (three) times a week.    . zolpidem  (AMBIEN) 10 MG tablet TAKE ONE-HALF TO ONE TABLET BY MOUTH AT BEDTIME AS NEEDED FOR SLEEP 30 tablet 3   No current facility-administered medications on file prior to visit.    Review of Systems Review of Systems  Constitutional: Negative for fever, appetite change, fatigue and unexpected weight change.  Eyes: Negative for pain and visual disturbance.  Respiratory: Negative for cough and shortness of breath.   Cardiovascular: Negative for cp or palpitations    Gastrointestinal: Negative for nausea, diarrhea and constipation.  Genitourinary: Negative for urgency and frequency.  Skin: Negative for pallor or rash   Neurological: Negative for weakness, light-headedness, numbness and headaches.  Hematological: Negative for adenopathy. Does not bruise/bleed easily.  Psychiatric/Behavioral: Negative for dysphoric mood. The patient is not nervous/anxious.         Objective:   Physical Exam  Constitutional: She appears well-developed and well-nourished. No distress.  Well appearing   HENT:  Head: Normocephalic and atraumatic.  Right Ear: External ear normal.  Left Ear: External ear normal.  Mouth/Throat: Oropharynx is clear and moist.  Eyes: Conjunctivae and EOM are normal. Pupils are equal, round, and reactive to light. No scleral icterus.  Neck: Normal range of motion. Neck supple. No JVD present. Carotid bruit is not present. No thyromegaly present.  Cardiovascular: Normal rate, regular rhythm, normal heart sounds and intact distal pulses.  Exam reveals no gallop.   Pacemaker noted in chest wall  Pulmonary/Chest: Effort normal and breath sounds normal. No respiratory distress. She has no wheezes. She has no rales. She exhibits no tenderness.  Abdominal: Soft. Bowel sounds are normal. She exhibits no distension, no abdominal bruit and no mass. There is no tenderness.  Genitourinary: No breast swelling, tenderness, discharge or bleeding.  Breast exam: No mass, nodules, thickening,  tenderness, bulging, retraction, inflamation, nipple discharge or skin changes noted.  No axillary or clavicular LA.    No tenderness today- but area of occ tenderness is around last bx scar  Musculoskeletal: Normal range of motion. She exhibits no edema or tenderness.  Lymphadenopathy:    She has no cervical adenopathy.  Neurological: She is alert. She has normal reflexes. No cranial nerve deficit. She exhibits normal muscle tone. Coordination normal.  Skin: Skin is warm and dry. No rash noted. No erythema. No pallor.  Solar lentigines Thin skin  Facial wrinkles present   Psychiatric: She has a normal mood and affect.          Assessment & Plan:   Problem List Items Addressed This Visit      Cardiovascular and  Mediastinum   Complete heart block (HCC) - Primary    Pt is doing well with pacemaker-no c/o        Other   Wrinkles    Pt uses tretinoin cream 0.1% daily  Also sunscreen  This was refilled Rev app way to use She understands this will be out of pocket       HX, PERSONAL, COLONIC POLYPS    Due for recall colonoscopy  Referred for that       Relevant Orders   Ambulatory referral to Gastroenterology   Estrogen deficiency    Ref for dexa/post menopausal female      Relevant Orders   DG Bone Density   Bruising    Will check cbc with platelet for easy bruising  Also has thinner skin /used to smoke She does not notice easy bleeding      Relevant Orders   CBC with Differential/Platelet (Completed)    Other Visit Diagnoses    Need for shingles vaccine        Relevant Orders    Varicella-zoster vaccine subcutaneous (Completed)

## 2015-09-09 NOTE — Assessment & Plan Note (Signed)
Ref for dexa/post menopausal female

## 2015-09-09 NOTE — Assessment & Plan Note (Signed)
Due for recall colonoscopy  Referred for that

## 2015-09-09 NOTE — Assessment & Plan Note (Signed)
Pt is doing well with pacemaker-no c/o

## 2015-09-09 NOTE — Assessment & Plan Note (Signed)
Will check cbc with platelet for easy bruising  Also has thinner skin /used to smoke She does not notice easy bleeding

## 2015-09-09 NOTE — Assessment & Plan Note (Signed)
Pt uses tretinoin cream 0.1% daily  Also sunscreen  This was refilled Rev app way to use She understands this will be out of pocket

## 2015-09-11 ENCOUNTER — Ambulatory Visit (INDEPENDENT_AMBULATORY_CARE_PROVIDER_SITE_OTHER): Payer: Medicare Other | Admitting: *Deleted

## 2015-09-11 DIAGNOSIS — I442 Atrioventricular block, complete: Secondary | ICD-10-CM | POA: Diagnosis not present

## 2015-09-11 NOTE — Progress Notes (Signed)
Remote pacemaker transmission.   

## 2015-09-13 ENCOUNTER — Encounter: Payer: Self-pay | Admitting: Cardiology

## 2015-09-14 LAB — CUP PACEART REMOTE DEVICE CHECK
Battery Remaining Percentage: 75 %
Brady Statistic RA Percent Paced: 14 %
Date Time Interrogation Session: 20170718053100
Implantable Lead Implant Date: 20130830
Implantable Lead Location: 753859
Implantable Lead Model: 4479
Lead Channel Impedance Value: 420 Ohm
Lead Channel Impedance Value: 492 Ohm
Lead Channel Setting Pacing Amplitude: 2 V
Lead Channel Setting Pacing Pulse Width: 0.5 ms
Lead Channel Setting Sensing Sensitivity: 2.5 mV
MDC IDC LEAD IMPLANT DT: 20130830
MDC IDC LEAD LOCATION: 753860
MDC IDC LEAD SERIAL: 473325
MDC IDC LEAD SERIAL: 523784
MDC IDC MSMT BATTERY REMAINING LONGEVITY: 48 mo
MDC IDC SET LEADCHNL RV PACING AMPLITUDE: 2.4 V
MDC IDC STAT BRADY RV PERCENT PACED: 100 %
Pulse Gen Serial Number: 134132

## 2015-10-02 ENCOUNTER — Encounter: Payer: Self-pay | Admitting: Internal Medicine

## 2015-10-02 ENCOUNTER — Ambulatory Visit (INDEPENDENT_AMBULATORY_CARE_PROVIDER_SITE_OTHER): Payer: Medicare Other | Admitting: Internal Medicine

## 2015-10-02 VITALS — BP 116/68 | HR 60 | Temp 98.2°F | Wt 165.8 lb

## 2015-10-02 DIAGNOSIS — M67431 Ganglion, right wrist: Secondary | ICD-10-CM

## 2015-10-02 NOTE — Patient Instructions (Signed)
Ganglion Cyst  A ganglion cyst is a noncancerous, fluid-filled lump that occurs near joints or tendons. The ganglion cyst grows out of a joint or the lining of a tendon. It most often develops in the hand or wrist, but it can also develop in the shoulder, elbow, hip, knee, ankle, or foot. The round or oval ganglion cyst can be the size of a pea or larger than a grape. Increased activity may enlarge the size of the cyst because more fluid starts to build up.   CAUSES  It is not known what causes a ganglion cyst to grow. However, it may be related to:  · Inflammation or irritation around the joint.  · An injury.  · Repetitive movements or overuse.  · Arthritis.  RISK FACTORS  Risk factors include:  · Being a woman.  · Being age 20-50.  SIGNS AND SYMPTOMS  Symptoms may include:   · A lump. This most often appears on the hand or wrist, but it can occur in other areas of the body.  · Tingling.  · Pain.  · Numbness.  · Muscle weakness.  · Weak grip.  · Less movement in a joint.  DIAGNOSIS  Ganglion cysts are most often diagnosed based on a physical exam. Your health care provider will feel the lump and may shine a light alongside it. If it is a ganglion cyst, a light often shines through it. Your health care provider may order an X-ray, ultrasound, or MRI to rule out other conditions.  TREATMENT  Ganglion cysts usually go away on their own without treatment. If pain or other symptoms are involved, treatment may be needed. Treatment is also needed if the ganglion cyst limits your movement or if it gets infected. Treatment may include:  · Wearing a brace or splint on your wrist or finger.  · Taking anti-inflammatory medicine.  · Draining fluid from the lump with a needle (aspiration).  · Injecting a steroid into the joint.  · Surgery to remove the ganglion cyst.  HOME CARE INSTRUCTIONS  · Do not press on the ganglion cyst, poke it with a needle, or hit it.  · Take medicines only as directed by your health care  provider.  · Wear your brace or splint as directed by your health care provider.  · Watch your ganglion cyst for any changes.  · Keep all follow-up visits as directed by your health care provider. This is important.  SEEK MEDICAL CARE IF:  · Your ganglion cyst becomes larger or more painful.  · You have increased redness, red streaks, or swelling.  · You have pus coming from the lump.  · You have weakness or numbness in the affected area.  · You have a fever or chills.     This information is not intended to replace advice given to you by your health care provider. Make sure you discuss any questions you have with your health care provider.     Document Released: 02/08/2000 Document Revised: 03/03/2014 Document Reviewed: 07/26/2013  Elsevier Interactive Patient Education ©2016 [employer]

## 2015-10-02 NOTE — Progress Notes (Signed)
Subjective:    Patient ID: Diana Cook, female    DOB: [birth year], 66 y.o.   MRN: [id number]  HPI  Pt presents to the clinic today with c/o 2 bumps on her right wrist. She reports she just noticed them yesterday. She does not feel like they have gotten bigger in size. She denies pain, numbness or tingling in her right hand. She has never had anything like this in the past. She has not tried anything OTC for this. She is going out of town tomorrow and wanted to get it checked before she left.  Review of Systems  Past Medical History:  Diagnosis Date  . Allergic rhinitis   . Asthma    as a child, mild now  . Colon polyps    colonoscopy 7/08, tubular adenoma  . Complete heart block ([place]) 8/13   s/p PPM implanted in [place] [facility]  . Seizure disorder ([place])   . Tobacco abuse     Current Outpatient Prescriptions  Medication Sig Dispense Refill  . acetaminophen (TYLENOL) 500 MG tablet Take 500 mg by mouth every 6 (six) hours as needed.    Marland Kitchen albuterol (PROVENTIL HFA;VENTOLIN HFA) 108 (90 Base) MCG/ACT inhaler Inhale 2 puffs into the lungs every 6 (six) hours as needed for wheezing or shortness of breath. 1 Inhaler 11  . Calcium-Magnesium-Vitamin D (CALCIUM 500 PO) Take 2 capsules by mouth 2 (two) times daily.    . furosemide (LASIX) 20 MG tablet TAKE ONE TABLET BY MOUTH ONCE DAILY AS NEEDED 30 tablet 3  . tretinoin (RETIN-A) 0.1 % cream Apply topically at bedtime. As directed for facial wrinkles 20 g 3  . zolpidem (AMBIEN) 10 MG tablet TAKE ONE-HALF TO ONE TABLET BY MOUTH AT BEDTIME AS NEEDED FOR SLEEP 30 tablet 3   No current facility-administered medications for this visit.     Allergies  Allergen Reactions  . Codeine     REACTION: nausea  . Morphine And Related     Nausea     Family History  Problem Relation Age of Onset  . Stroke Mother   . Heart disease Mother     MI  . Dementia Mother   . Coronary artery disease Father   . Parkinsonism Father     Social History     Social History  . Marital status: Married    Spouse name: N/A  . Number of children: N/A  . Years of education: N/A   Occupational History  . Not on file.   Social History Main Topics  . Smoking status: Former Smoker    Packs/day: 0.40    Years: 40.00    Types: Cigarettes    Quit date: 11/11/2010  . Smokeless tobacco: Never Used  . Alcohol use 0.0 oz/week     Comment: wine (pt said it's very occ)  . Drug use: No  . Sexual activity: No   Other Topics Concern  . Not on file   Social History Narrative   Married 40 years.   Exercises, does walking tapes     Constitutional: Denies fever, malaise, fatigue, headache or abrupt weight changes.  Musculoskeletal: Denies decrease in range of motion, difficulty with gait, muscle pain or joint pain and swelling.  Skin: Pt reports 2 bumps on right wrist. Denies redness, rashes, or ulcercations.  Neurological: Denies dizziness, difficulty with memory, difficulty with speech or problems with balance and coordination.    No other specific complaints in a complete review of systems (except as  listed in HPI above).     Objective:   Physical Exam  BP 116/68   Pulse 60   Temp 98.2 F (36.8 C) (Oral)   Wt 165 lb 12 oz (75.2 kg)   SpO2 96%   BMI 25.20 kg/m  Wt Readings from Last 3 Encounters:  10/02/15 165 lb 12 oz (75.2 kg)  09/07/15 165 lb (74.8 kg)  07/26/15 164 lb (74.4 kg)    General: Appears her stated age, well developed, well nourished in NAD. Skin: 0.5 cm round ganglion cyst noted on right anterolateral wrist. She has a smaller < 0.5 cm area of swelling distal to the cyst. Cardiovascular: Radial pulse 2+ bilaterally. Musculoskeletal: Normal flexion, extension and rotation of the right wrist. Hand grips equal. Neurological: Sensation intact to right hand.    BMET    Component Value Date/Time   NA 135 07/10/2014 1211   NA 136 07/21/2013 1517   K 4.7 07/10/2014 1211   K 4.2 07/21/2013 1517   CL 101 07/10/2014  1211   CL 105 07/21/2013 1517   CO2 30 07/10/2014 1211   CO2 22 07/21/2013 1517   GLUCOSE 101 (H) 07/10/2014 1211   GLUCOSE 131 (H) 07/21/2013 1517   BUN 18 07/10/2014 1211   BUN 19 (H) 07/21/2013 1517   CREATININE 0.89 07/10/2014 1211   CREATININE 0.94 07/21/2013 1517   CALCIUM 10.6 (H) 07/10/2014 1211   CALCIUM 9.4 07/21/2013 1517   GFRNONAA >60 07/21/2013 1517   GFRAA >60 07/21/2013 1517    Lipid Panel     Component Value Date/Time   CHOL 168 07/10/2014 1211   TRIG 76.0 07/10/2014 1211   HDL 55.80 07/10/2014 1211   CHOLHDL 3 07/10/2014 1211   VLDL 15.2 07/10/2014 1211   LDLCALC 97 07/10/2014 1211    CBC    Component Value Date/Time   WBC 6.8 09/07/2015 1202   RBC 4.40 09/07/2015 1202   HGB 13.1 09/07/2015 1202   HGB 13.0 07/21/2013 1517   HCT 39.4 09/07/2015 1202   HCT 39.5 07/21/2013 1517   PLT 271.0 09/07/2015 1202   PLT 252 07/21/2013 1517   MCV 89.6 09/07/2015 1202   MCV 90 07/21/2013 1517   MCH 29.6 07/21/2013 1517   MCHC 33.2 09/07/2015 1202   RDW 13.2 09/07/2015 1202   RDW 13.6 07/21/2013 1517   LYMPHSABS 1.7 09/07/2015 1202   LYMPHSABS 0.9 (L) 07/21/2013 1517   MONOABS 0.7 09/07/2015 1202   MONOABS 0.6 07/21/2013 1517   EOSABS 0.1 09/07/2015 1202   EOSABS 0.1 07/21/2013 1517   BASOSABS 0.0 09/07/2015 1202   BASOSABS 0.1 07/21/2013 1517    Hgb A1C No results found for: HGBA1C          Assessment & Plan:  Ganglion cyst, right wrist:  Not causing pain, numbness or tingling No intervention needed at this time, will monitor If persist or worsens, can refer to a hand surgeon  RTC as needed or if symptoms persist or worsen Webb Silversmith, NP

## 2015-10-23 ENCOUNTER — Ambulatory Visit (AMBULATORY_SURGERY_CENTER): Payer: Self-pay

## 2015-10-23 VITALS — Ht 68.5 in | Wt 169.0 lb

## 2015-10-23 DIAGNOSIS — Z860100 Personal history of colon polyps, unspecified: Secondary | ICD-10-CM

## 2015-10-23 MED ORDER — SUPREP BOWEL PREP KIT 17.5-3.13-1.6 GM/177ML PO SOLN: 1.0000 | Freq: Once | ORAL | 0 refills | Status: AC

## 2015-10-23 NOTE — Progress Notes (Signed)
No allergies to eggs or soy No diet meds No home oxygen No past problems with anesthesia  Has email and internet; declined emmi 

## 2015-10-26 DIAGNOSIS — M8580 Other specified disorders of bone density and structure, unspecified site: Secondary | ICD-10-CM

## 2015-10-26 HISTORY — PX: COLONOSCOPY: SHX174

## 2015-10-26 HISTORY — DX: Other specified disorders of bone density and structure, unspecified site: M85.80

## 2015-11-06 ENCOUNTER — Encounter: Payer: Self-pay | Admitting: Gastroenterology

## 2015-11-06 ENCOUNTER — Ambulatory Visit (AMBULATORY_SURGERY_CENTER): Payer: Medicare Other | Admitting: Gastroenterology

## 2015-11-06 VITALS — BP 113/58 | HR 60 | Temp 95.9°F | Resp 14 | Ht 68.5 in | Wt 169.0 lb

## 2015-11-06 DIAGNOSIS — Z1211 Encounter for screening for malignant neoplasm of colon: Secondary | ICD-10-CM | POA: Diagnosis not present

## 2015-11-06 DIAGNOSIS — Z86010 Personal history of colonic polyps: Secondary | ICD-10-CM | POA: Diagnosis not present

## 2015-11-06 DIAGNOSIS — D124 Benign neoplasm of descending colon: Secondary | ICD-10-CM

## 2015-11-06 DIAGNOSIS — D125 Benign neoplasm of sigmoid colon: Secondary | ICD-10-CM | POA: Diagnosis not present

## 2015-11-06 DIAGNOSIS — D122 Benign neoplasm of ascending colon: Secondary | ICD-10-CM | POA: Diagnosis not present

## 2015-11-06 DIAGNOSIS — R569 Unspecified convulsions: Secondary | ICD-10-CM | POA: Diagnosis not present

## 2015-11-06 MED ORDER — SODIUM CHLORIDE 0.9 % IV SOLN: 500.0000 mL | INTRAVENOUS | Status: DC

## 2015-11-06 NOTE — Patient Instructions (Signed)
YOU HAD AN ENDOSCOPIC PROCEDURE TODAY AT [facility]:   Refer to the procedure report that was given to you for any specific questions about what was found during the examination.  If the procedure report does not answer your questions, please call your gastroenterologist to clarify.  If you requested that your care partner not be given the details of your procedure findings, then the procedure report has been included in a sealed envelope for you to review at your convenience later.  YOU SHOULD EXPECT: Some feelings of bloating in the abdomen. Passage of more gas than usual.  Walking can help get rid of the air that was put into your GI tract during the procedure and reduce the bloating. If you had a lower endoscopy (such as a colonoscopy or flexible sigmoidoscopy) you may notice spotting of blood in your stool or on the toilet paper. If you underwent a bowel prep for your procedure, you may not have a normal bowel movement for a few days.  Please Note:  You might notice some irritation and congestion in your nose or some drainage.  This is from the oxygen used during your procedure.  There is no need for concern and it should clear up in a day or so.  SYMPTOMS TO REPORT IMMEDIATELY:   Following lower endoscopy (colonoscopy or flexible sigmoidoscopy):  Excessive amounts of blood in the stool  Significant tenderness or worsening of abdominal pains  Swelling of the abdomen that is new, acute  Fever of 100F or higher   Following upper endoscopy (EGD)  Vomiting of blood or coffee ground material  New chest pain or pain under the shoulder blades  Painful or persistently difficult swallowing  New shortness of breath  Fever of 100F or higher  Black, tarry-looking stools  For urgent or emergent issues, a gastroenterologist can be reached at any hour by calling [phone].   DIET:  We do recommend a small meal at first, but then you may proceed to your regular diet.  Drink  plenty of fluids but you should avoid alcoholic beverages for 24 hours.  ACTIVITY:  You should plan to take it easy for the rest of today and you should NOT DRIVE or use heavy machinery until tomorrow (because of the sedation medicines used during the test).    FOLLOW UP: Our staff will call the number listed on your records the next business day following your procedure to check on you and address any questions or concerns that you may have regarding the information given to you following your procedure. If we do not reach you, we will leave a message.  However, if you are feeling well and you are not experiencing any problems, there is no need to return our call.  We will assume that you have returned to your regular daily activities without incident.  If any biopsies were taken you will be contacted by phone or by letter within the next 1-3 weeks.  Please call us at [phone] if you have not heard about the biopsies in 3 weeks.    SIGNATURES/CONFIDENTIALITY: You and/or your care partner have signed paperwork which will be entered into your electronic medical record.  These signatures attest to the fact that that the information above on your After Visit Summary has been reviewed and is understood.  Full responsibility of the confidentiality of this discharge information lies with you and/or your care-partner.    Handout was given to your care partner on polyps.  You may resume your current medications today. Await biopsy results. Please call if any questions or concerns.

## 2015-11-06 NOTE — Op Note (Signed)
[place] Patient Name: Diana Cook Procedure Date: 11/06/2015 10:35 AM MRN: [id number] Endoscopist: Milus Banister , MD Age: 65 Referring MD:  Date of Birth: 10/21/1950 Gender: Female Account #: [id number] Procedure:                Colonoscopy Indications:              High risk colon cancer surveillance: Personal                            history of colonic polyps (four polyps 2011, 2-3 of                            them were adenomatous) Medicines:                Monitored Anesthesia Care Procedure:                Pre-Anesthesia Assessment:                           - Prior to the procedure, a History and Physical                            was performed, and patient medications and                            allergies were reviewed. The patient's tolerance of                            previous anesthesia was also reviewed. The risks                            and benefits of the procedure and the sedation                            options and risks were discussed with the patient.                            All questions were answered, and informed consent                            was obtained. Prior Anticoagulants: The patient has                            taken no previous anticoagulant or antiplatelet                            agents. ASA Grade Assessment: II - A patient with                            mild systemic disease. After reviewing the risks                            and benefits, the patient was deemed in  satisfactory condition to undergo the procedure.                           After obtaining informed consent, the colonoscope                            was passed under direct vision. Throughout the                            procedure, the patient's blood pressure, pulse, and                            oxygen saturations were monitored continuously. The                            Model CF-HQ190L [id number]) scope was  introduced                            through the anus and advanced to the the cecum,                            identified by appendiceal orifice and ileocecal                            valve. The colonoscopy was performed without                            difficulty. The patient tolerated the procedure                            well. The quality of the bowel preparation was                            excellent. The ileocecal valve, appendiceal                            orifice, and rectum were photographed. Scope In: 10:45:24 AM Scope Out: 11:02:18 AM Scope Withdrawal Time: 0 hours 12 minutes 34 seconds  Total Procedure Duration: 0 hours 16 minutes 54 seconds  Findings:                 Four sessile polyps were found in the sigmoid                            colon, descending colon and ascending colon. The                            polyps were 4 to 10 mm in size. These polyps were                            removed with a cold snare. Resection and retrieval                            were complete.  The exam was otherwise without abnormality on                            direct and retroflexion views. Complications:            No immediate complications. Estimated blood loss:                            None. Estimated Blood Loss:     Estimated blood loss: none. Impression:               - Four 4 to 10 mm polyps in the sigmoid colon, in                            the descending colon and in the ascending colon,                            removed with a cold snare. Resected and retrieved.                           - The examination was otherwise normal on direct                            and retroflexion views. Recommendation:           - Patient has a contact number available for                            emergencies. The signs and symptoms of potential                            delayed complications were discussed with the                             patient. Return to normal activities tomorrow.                            Written discharge instructions were provided to the                            patient.                           - Resume previous diet.                           - Continue present medications.                           You will receive a letter within 2-3 weeks with the                            pathology results and my final recommendations.                           If the polyp(s) is proven to be '  pre-cancerous' on                            pathology, you will need repeat colonoscopy in 3-5                            years. Milus Banister, MD 11/06/2015 11:06:03 AM This report has been signed electronically.

## 2015-11-06 NOTE — Progress Notes (Signed)
Patient awakening,vss,report to rn 

## 2015-11-06 NOTE — Progress Notes (Signed)
Last seizure was when she was 65 years old.

## 2015-11-06 NOTE — Progress Notes (Signed)
Called to room to assist during endoscopic procedure.  Patient ID and intended procedure confirmed with present staff. Received instructions for my participation in the procedure from the performing physician.  

## 2015-11-06 NOTE — Progress Notes (Signed)
No problems noted in the recovery room. maw 

## 2015-11-07 ENCOUNTER — Telehealth: Payer: Self-pay | Admitting: *Deleted

## 2015-11-07 NOTE — Telephone Encounter (Signed)
  Follow up Call-  Call back number 11/06/2015  Post procedure Call Back phone  # [phone]  Permission to leave phone message Yes  Some recent data might be hidden     Patient questions:  Do you have a fever, pain , or abdominal swelling? No. Pain Score  0 *  Have you tolerated food without any problems? Yes.    Have you been able to return to your normal activities? Yes.    Do you have any questions about your discharge instructions: Diet   No. Medications  No. Follow up visit  No.  Do you have questions or concerns about your Care? No.  Actions: * If pain score is 4 or above: No action needed, pain <4.

## 2015-11-11 ENCOUNTER — Encounter: Payer: Self-pay | Admitting: Gastroenterology

## 2015-11-19 ENCOUNTER — Ambulatory Visit: Payer: Medicare Other

## 2015-11-19 ENCOUNTER — Other Ambulatory Visit: Payer: Medicare Other

## 2015-11-22 ENCOUNTER — Ambulatory Visit
Admission: RE | Admit: 2015-11-22 | Discharge: 2015-11-22 | Disposition: A | Discharge status: Home / Self Care | Payer: Medicare Other | Source: Ambulatory Visit | Attending: Family Medicine | Admitting: Family Medicine

## 2015-11-22 ENCOUNTER — Encounter: Payer: Self-pay | Admitting: Family Medicine

## 2015-11-22 ENCOUNTER — Other Ambulatory Visit: Payer: Self-pay | Admitting: Family Medicine

## 2015-11-22 DIAGNOSIS — R928 Other abnormal and inconclusive findings on diagnostic imaging of breast: Secondary | ICD-10-CM

## 2015-11-22 DIAGNOSIS — Z1231 Encounter for screening mammogram for malignant neoplasm of breast: Secondary | ICD-10-CM

## 2015-11-22 DIAGNOSIS — Z13820 Encounter for screening for osteoporosis: Secondary | ICD-10-CM | POA: Diagnosis not present

## 2015-11-22 DIAGNOSIS — R921 Mammographic calcification found on diagnostic imaging of breast: Secondary | ICD-10-CM

## 2015-11-22 DIAGNOSIS — M8580 Other specified disorders of bone density and structure, unspecified site: Secondary | ICD-10-CM | POA: Diagnosis not present

## 2015-11-22 DIAGNOSIS — M85862 Other specified disorders of bone density and structure, left lower leg: Secondary | ICD-10-CM | POA: Diagnosis not present

## 2015-11-22 DIAGNOSIS — M810 Age-related osteoporosis without current pathological fracture: Secondary | ICD-10-CM | POA: Insufficient documentation

## 2015-11-22 DIAGNOSIS — E2839 Other primary ovarian failure: Secondary | ICD-10-CM

## 2015-11-22 LAB — HM DEXA SCAN

## 2015-11-28 ENCOUNTER — Encounter: Payer: Self-pay | Admitting: *Deleted

## 2015-12-04 ENCOUNTER — Ambulatory Visit
Admission: RE | Admit: 2015-12-04 | Discharge: 2015-12-04 | Disposition: A | Discharge status: Home / Self Care | Payer: Medicare Other | Source: Ambulatory Visit | Attending: Family Medicine | Admitting: Family Medicine

## 2015-12-04 ENCOUNTER — Other Ambulatory Visit: Payer: Self-pay | Admitting: Family Medicine

## 2015-12-04 DIAGNOSIS — R928 Other abnormal and inconclusive findings on diagnostic imaging of breast: Secondary | ICD-10-CM

## 2015-12-04 DIAGNOSIS — N6311 Unspecified lump in the right breast, upper outer quadrant: Secondary | ICD-10-CM | POA: Diagnosis not present

## 2015-12-04 DIAGNOSIS — R921 Mammographic calcification found on diagnostic imaging of breast: Secondary | ICD-10-CM

## 2015-12-04 DIAGNOSIS — R922 Inconclusive mammogram: Secondary | ICD-10-CM | POA: Insufficient documentation

## 2015-12-04 DIAGNOSIS — N6312 Unspecified lump in the right breast, upper inner quadrant: Secondary | ICD-10-CM | POA: Diagnosis not present

## 2015-12-05 ENCOUNTER — Telehealth: Payer: Self-pay | Admitting: Family Medicine

## 2015-12-05 DIAGNOSIS — N63 Unspecified lump in unspecified breast: Secondary | ICD-10-CM | POA: Insufficient documentation

## 2015-12-05 NOTE — Telephone Encounter (Signed)
Patient called requesting to have her Breast Biopsy done by Dr Bary Castilla in [place]. Please place Surgical consult in Epic ASAP so we can call to get the patient scheduled.

## 2015-12-05 NOTE — Telephone Encounter (Signed)
Done Routing to [facility]

## 2015-12-06 ENCOUNTER — Ambulatory Visit (INDEPENDENT_AMBULATORY_CARE_PROVIDER_SITE_OTHER): Payer: Medicare Other | Admitting: General Surgery

## 2015-12-06 ENCOUNTER — Encounter: Payer: Self-pay | Admitting: General Surgery

## 2015-12-06 ENCOUNTER — Inpatient Hospital Stay: Payer: Self-pay

## 2015-12-06 VITALS — BP 130/76 | HR 74 | Resp 12 | Ht 68.0 in | Wt 160.0 lb

## 2015-12-06 DIAGNOSIS — C50411 Malignant neoplasm of upper-outer quadrant of right female breast: Secondary | ICD-10-CM | POA: Diagnosis not present

## 2015-12-06 DIAGNOSIS — N6310 Unspecified lump in the right breast, unspecified quadrant: Secondary | ICD-10-CM

## 2015-12-06 DIAGNOSIS — R920 Mammographic microcalcification found on diagnostic imaging of breast: Secondary | ICD-10-CM | POA: Insufficient documentation

## 2015-12-06 HISTORY — PX: BREAST BIOPSY: SHX20

## 2015-12-06 NOTE — Progress Notes (Signed)
Patient ID: Diana Cook, female   DOB: 04-26-1950, 65 y.o.   MRN: [id number]  Chief Complaint  Patient presents with  . Breast Problem    HPI Diana Cook is a 65 y.o. female.  who presents for a breast evaluation. The most recent mammogram and right breast ultrasound was done on 12-04-15. She states she had a growth in the right breast and the left breast has calcifications. Patient does not perform regular self breast checks and gets regular mammograms done.  She states her breast are tender most of the time. Denies breast injury or trauma.   Her daughter with cervical cancer had a PET scan and is being followed at [facility].  She is here with her husband, Diana Cook.  HPI  Past Medical History:  Diagnosis Date  . Allergic rhinitis   . Asthma    as a child, mild now  . Colon polyps    colonoscopy 7/08, tubular adenoma  . Complete heart block ([place]) 8/13   s/p PPM implanted in [facility]  . Pacemaker    2011  . Seizure disorder ([place])   . Seizures ([facility])    LAST HAD ONE WHEN SHE WAS 65 YEARS OLD.  Marland Kitchen Small bowel obstruction    1988 and 2002  . Tobacco abuse     Past Surgical History:  Procedure Laterality Date  . BREAST BIOPSY Right 11/2005   benign inflammatory changes  . CATARACT EXTRACTION    . COLONOSCOPY  10/2015   Dr Ardis Hughs  . gyn surgery  1993   hysterectomy- form endometriosis  . LAPAROSCOPY    . PACEMAKER INSERTION  10/24/11   [employer] Advantio dual chamber PPM implanted by Dr Bunnie Philips at [facility] in [place]  . SMALL INTESTINE SURGERY  12/2000   for blockage    Family History  Problem Relation Age of Onset  . Stroke Mother   . Heart disease Mother     MI  . Dementia Mother   . Coronary artery disease Father   . Parkinsonism Father   . Cancer - Cervical Daughter 27  . Colon cancer Neg Hx   . Breast cancer Neg Hx     Social History Social History  Substance Use Topics  . Smoking status: Former Smoker     Packs/day: 0.40    Years: 40.00    Types: Cigarettes    Quit date: 11/11/2010  . Smokeless tobacco: Never Used  . Alcohol use 4.2 oz/week    7 Glasses of wine per week     Comment: wine (pt said it's very occ)    Allergies  Allergen Reactions  . Codeine     REACTION: nausea  . Morphine And Related     Nausea     Current Outpatient Prescriptions  Medication Sig Dispense Refill  . acetaminophen (TYLENOL) 500 MG tablet Take 500 mg by mouth every 6 (six) hours as needed.    Marland Kitchen albuterol (PROVENTIL HFA;VENTOLIN HFA) 108 (90 Base) MCG/ACT inhaler Inhale 2 puffs into the lungs every 6 (six) hours as needed for wheezing or shortness of breath. 1 Inhaler 11  . Calcium-Magnesium-Vitamin D (CALCIUM 500 PO) Take 2 capsules by mouth 2 (two) times daily.    . furosemide (LASIX) 20 MG tablet TAKE ONE TABLET BY MOUTH ONCE DAILY AS NEEDED 30 tablet 3  . tretinoin (RETIN-A) 0.1 % cream Apply topically at bedtime. As directed for facial wrinkles 20 g 3  . zolpidem (AMBIEN)  10 MG tablet TAKE ONE-HALF TO ONE TABLET BY MOUTH AT BEDTIME AS NEEDED FOR SLEEP 30 tablet 3   No current facility-administered medications for this visit.     Review of Systems Review of Systems  Constitutional: Negative.  Negative for unexpected weight change.  Respiratory: Negative.   Cardiovascular: Negative.   All other systems reviewed and are negative.   Blood pressure 130/76, pulse 74, resp. rate 12, height '5\' 8"'$  (1.727 m), weight 160 lb (72.6 kg).  Physical Exam Physical Exam  Constitutional: She is oriented to person, place, and time. She appears well-developed and well-nourished.  HENT:  Mouth/Throat: Oropharynx is clear and moist.  Eyes: Conjunctivae are normal. No scleral icterus.  Neck: Neck supple.  Cardiovascular: Normal rate, regular rhythm and normal heart sounds.   Pulmonary/Chest: Effort normal and breath sounds normal. Right breast exhibits no inverted nipple, no mass, no nipple discharge, no skin  change and no tenderness. Left breast exhibits no inverted nipple, no mass, no nipple discharge, no skin change and no tenderness.    transverse incision 5-6 o'clock position right breast.  Lymphadenopathy:    She has no cervical adenopathy.    She has no axillary adenopathy.  Neurological: She is alert and oriented to person, place, and time.  Skin: Skin is warm and dry.  Psychiatric: Her behavior is normal.    Data Reviewed Screening mammogram of September 28 as well as diagnostic mammogram and ultrasound of 12/03/2009 reviewed.  Cardiology notes of 06/12/2015 reviewed. Sinus node dysfunction with chronotropic incompetence. Coronary catheterization failed to show evidence of coronary artery disease. Normal left ventricular function on echocardiogram September 2014.  Ultrasound examination of the right breast of the 10:00 position, 1 cm from the nipple showed an irregular hypoechoic mass with focal posterior acoustic shadowing. This measured 1.2 x 1.36 x 1.46. The patient was amenable to core biopsy. 10 mL of 0.5% Xylocaine with 0.25% Marcaine with 1-200,000 epinephrine was utilized well tolerated. ChloraPrep was applied to the skin. A 14-gauge Finesse biopsy device was passed through the lesion in 2 locations and 8 core samples obtained. No bleeding noted. Skin defect closed with benzoin and Steri-Strip. Telfa and Tegaderm dressing applied.  Postbiopsy instructions provided.  Assessment    Right breast cancer until proven otherwise.  Indeterminate microcalcifications of the left breast.    Plan    The patient will be contacted when the biopsy results are available. Would plan to biopsy the lower inner quadrant microcalcifications with bracketing wires at the time of treatment of her right breast cancer. Would not preclude later sentinel node biopsy of invasive malignancy was identified.      Biopsy right breast 10 o'clock     This information has been scribed by Karie Fetch  RN, BSN,BC.   [name] 12/06/2015, 9:00 PM

## 2015-12-06 NOTE — Patient Instructions (Signed)

## 2015-12-10 ENCOUNTER — Telehealth: Payer: Self-pay | Admitting: *Deleted

## 2015-12-10 ENCOUNTER — Other Ambulatory Visit: Payer: Self-pay

## 2015-12-10 DIAGNOSIS — C50911 Malignant neoplasm of unspecified site of right female breast: Secondary | ICD-10-CM

## 2015-12-10 NOTE — Telephone Encounter (Signed)
Patient is looking for results from her biopsy on 12/06/15, she was told she would get the results on Friday but never got a call.

## 2015-12-10 NOTE — Telephone Encounter (Signed)
The patient was contacted with the biopsy results from October 12. This did show an invasive cancer is clinically expected.  She had her husband have been asked to come in tomorrow at 3:30 PM for laboratory studies and physician consultation at 4 PM.

## 2015-12-11 ENCOUNTER — Ambulatory Visit (INDEPENDENT_AMBULATORY_CARE_PROVIDER_SITE_OTHER): Payer: Medicare Other | Admitting: *Deleted

## 2015-12-11 ENCOUNTER — Ambulatory Visit (INDEPENDENT_AMBULATORY_CARE_PROVIDER_SITE_OTHER): Payer: Medicare Other | Admitting: General Surgery

## 2015-12-11 ENCOUNTER — Encounter: Payer: Self-pay | Admitting: General Surgery

## 2015-12-11 VITALS — BP 128/72 | HR 78 | Resp 12 | Ht 68.0 in | Wt 172.0 lb

## 2015-12-11 DIAGNOSIS — Z170 Estrogen receptor positive status [ER+]: Secondary | ICD-10-CM | POA: Diagnosis not present

## 2015-12-11 DIAGNOSIS — I442 Atrioventricular block, complete: Secondary | ICD-10-CM

## 2015-12-11 DIAGNOSIS — C50911 Malignant neoplasm of unspecified site of right female breast: Secondary | ICD-10-CM | POA: Diagnosis not present

## 2015-12-11 DIAGNOSIS — C50411 Malignant neoplasm of upper-outer quadrant of right female breast: Secondary | ICD-10-CM | POA: Diagnosis not present

## 2015-12-11 DIAGNOSIS — Z853 Personal history of malignant neoplasm of breast: Secondary | ICD-10-CM | POA: Insufficient documentation

## 2015-12-11 NOTE — Progress Notes (Signed)
Remote pacemaker transmission.   

## 2015-12-11 NOTE — Progress Notes (Signed)
Patient ID: Diana Cook, female   DOB: May 24, 1950, 65 y.o.   MRN: [id number]  Chief Complaint  Patient presents with  . Other    discussion    HPI Diana Cook is a 65 y.o. female here today for breast discussion. follow up post breast biopsy.  Dressing removed, steristrip in place and aware it may come off in one week.  Minimal bruising noted.  The patient is aware that a heating pad may be used for comfort as needed.  Aware of pathology.  HPI  Past Medical History:  Diagnosis Date  . Allergic rhinitis   . Asthma    as a child, mild now  . Colon polyps    colonoscopy 7/08, tubular adenoma  . Complete heart block ([place]) 8/13   s/p PPM implanted in [facility]  . Pacemaker    2011  . Seizure disorder ([place])   . Seizures ([facility])    LAST HAD ONE WHEN SHE WAS 65 YEARS OLD.  Marland Kitchen Small bowel obstruction    1988 and 2002  . Tobacco abuse     Past Surgical History:  Procedure Laterality Date  . BREAST BIOPSY Right 11/2005   benign inflammatory changes  . CATARACT EXTRACTION    . COLONOSCOPY  10/2015   Dr Ardis Hughs  . gyn surgery  1993   hysterectomy- form endometriosis  . LAPAROSCOPY    . PACEMAKER INSERTION  10/24/11   [employer] Advantio dual chamber PPM implanted by Dr Bunnie Philips at [facility] in [place]  . SMALL INTESTINE SURGERY  12/2000   for blockage    Family History  Problem Relation Age of Onset  . Stroke Mother   . Heart disease Mother     MI  . Dementia Mother   . Coronary artery disease Father   . Parkinsonism Father   . Cancer - Cervical Daughter 3  . Colon cancer Neg Hx   . Breast cancer Neg Hx     Social History Social History  Substance Use Topics  . Smoking status: Former Smoker    Packs/day: 0.40    Years: 40.00    Types: Cigarettes    Quit date: 11/11/2010  . Smokeless tobacco: Never Used  . Alcohol use 4.2 oz/week    7 Glasses of wine per week     Comment: wine (pt said it's very occ)    Allergies  Allergen  Reactions  . Codeine     REACTION: nausea  . Morphine And Related     Nausea     Current Outpatient Prescriptions  Medication Sig Dispense Refill  . acetaminophen (TYLENOL) 500 MG tablet Take 500 mg by mouth every 6 (six) hours as needed.    Marland Kitchen albuterol (PROVENTIL HFA;VENTOLIN HFA) 108 (90 Base) MCG/ACT inhaler Inhale 2 puffs into the lungs every 6 (six) hours as needed for wheezing or shortness of breath. 1 Inhaler 11  . Calcium-Magnesium-Vitamin D (CALCIUM 500 PO) Take 2 capsules by mouth 2 (two) times daily.    . furosemide (LASIX) 20 MG tablet TAKE ONE TABLET BY MOUTH ONCE DAILY AS NEEDED 30 tablet 3  . tretinoin (RETIN-A) 0.1 % cream Apply topically at bedtime. As directed for facial wrinkles 20 g 3  . zolpidem (AMBIEN) 10 MG tablet TAKE ONE-HALF TO ONE TABLET BY MOUTH AT BEDTIME AS NEEDED FOR SLEEP 30 tablet 3   No current facility-administered medications for this visit.     Review of Systems Review of Systems  Constitutional: Negative.   Respiratory: Negative.   Cardiovascular: Negative.     Blood pressure 128/72, pulse 78, resp. rate 12, height '5\' 8"'$  (1.727 m), weight 172 lb (78 kg).  Physical Exam Physical Exam  Constitutional: She is oriented to person, place, and time. She appears well-developed and well-nourished.  Neurological: She is alert and oriented to person, place, and time.    Data Reviewed ER 100%, PR 100%, HER-2/neu not overexpressed. Ki-67: 10%.  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  Indeterminate microcalcifications in the lower inner quadrant of the left breast orienting excision.    Plan    The majority of the visit was spent reviewing the options for breast cancer treatment. Breast conservation with lumpectomy and radiation therapy  was presented as equivalent to mastectomy for long-term control. The pros and cons of each treatment regimen were reviewed. The indications for additional therapy such as chemotherapy were  touched on briefly, realizing that the majority of information required to determine if chemotherapy would be of benefit is not available at this time. The availability of consultation services for medical oncology and radiation oncology prior to surgery were reviewed.  The tumor is very close to the nipple areolar complex, and I have told the patient that should she choose breast conservation it might involve loss of all or part of the nipple/areolar complex. This would be determined by intraoperative frozen section.  The microcalcifications in the left breast are nonpalpable, and bracketing wires would be appropriate. I don't think it's necessary to obtain a preoperative biopsy, as if DCIS is identified and bracketing wires for excision would be needed. If an invasive cancer was identified, unlikely, sentinel node biopsy could be completed at a later date based on their location.  Informational brochure and OpSite dressings were provided.     This information has been scribed by Gaspar Cola CMA.   Diana Cook 12/11/2015, 5:26 PM

## 2015-12-12 ENCOUNTER — Telehealth: Payer: Self-pay | Admitting: *Deleted

## 2015-12-12 ENCOUNTER — Encounter: Payer: Self-pay | Admitting: Cardiology

## 2015-12-12 LAB — CANCER ANTIGEN 27.29: CA 27.29: 24.5 U/mL (ref 0.0–38.6)

## 2015-12-12 LAB — CEA: CEA: 2.2 ng/mL (ref 0.0–4.7)

## 2015-12-12 NOTE — Telephone Encounter (Signed)
Notified patient as instructed, patient pleased. Still talking with the family. Date availability discussed with the patient. She will write down her questions. She will try to sign up for my chart as well.

## 2015-12-12 NOTE — Telephone Encounter (Signed)
-----   Message from [name], MD sent at 12/12/2015  9:52 AM EDT ----- Please notify the patient the blood work was fine. See if she has any questions about yesterday is discussing regarding breast cancer management options. Thank you ----- Message ----- From: Lavone Neri Lab Results In Sent: 12/12/2015   7:38 AM To: [name], MD

## 2015-12-18 ENCOUNTER — Other Ambulatory Visit: Payer: Self-pay

## 2015-12-18 ENCOUNTER — Other Ambulatory Visit: Payer: Self-pay | Admitting: General Surgery

## 2015-12-18 ENCOUNTER — Telehealth: Payer: Self-pay

## 2015-12-18 DIAGNOSIS — R920 Mammographic microcalcification found on diagnostic imaging of breast: Secondary | ICD-10-CM

## 2015-12-18 DIAGNOSIS — Z170 Estrogen receptor positive status [ER+]: Principal | ICD-10-CM

## 2015-12-18 DIAGNOSIS — C50411 Malignant neoplasm of upper-outer quadrant of right female breast: Secondary | ICD-10-CM

## 2015-12-18 NOTE — Telephone Encounter (Signed)
Spoke with patient about scheduling her surgery. The patient is scheduled for surgery at [facility] on 12/25/15. She will pre admit by phone. She will arrive at the [facility] on 12/25/15 at 7:45 am. The patient is aware of date, time, and instructions.

## 2015-12-20 ENCOUNTER — Telehealth: Payer: Self-pay

## 2015-12-20 NOTE — Telephone Encounter (Signed)
The patient's surgery is rescheduled to 01/02/16. She will pre admit by phone. She is to report to the [facility] on 01/02/16 at 8:00 am. The patient is aware of date, time, and instructions.

## 2015-12-21 ENCOUNTER — Other Ambulatory Visit: Payer: Medicare Other

## 2015-12-21 NOTE — Patient Instructions (Addendum)
  Your procedure is scheduled on: 01-02-16  [facility]) Report to [place] @ 8 AM   Remember: Instructions that are not followed completely may result in serious medical risk, up to and including death, or upon the discretion of your surgeon and anesthesiologist your surgery may need to be rescheduled.    _x___ 1. Do not eat food or drink liquids after midnight. No gum chewing or hard candies.     __x__ 2. No Alcohol for 24 hours before or after surgery.   __x__3. No Smoking for 24 prior to surgery.   ____  4. Bring all medications with you on the day of surgery if instructed.    __x__ 5. Notify your doctor if there is any change in your medical condition     (cold, fever, infections).     Do not wear jewelry, make-up, hairpins, clips or nail polish.  Do not wear lotions, powders, or perfumes. You may wear deodorant.  Do not shave 48 hours prior to surgery. Men may shave face and neck.  Do not bring valuables to the hospital.    [facility] is not responsible for any belongings or valuables.               Contacts, dentures or bridgework may not be worn into surgery.  Leave your suitcase in the car. After surgery it may be brought to your room.  For patients admitted to the hospital, discharge time is determined by your treatment team.   Patients discharged the day of surgery will not be allowed to drive home.    Please read over the following fact sheets that you were given:   [facility] Preparing for Surgery and or MRSA Information   ____ Take these medicines the morning of surgery with A SIP OF WATER:    1. NONE  2.  3.  4.  5.  6.  ____Fleets enema or Magnesium Citrate as directed.   _x___ Use CHG Soap or sage wipes as directed on instruction sheet   _X___ Use inhalers on the day of surgery and bring to hospital day of surgery-USE ALBUTEROL INHALER AT [place]  ____ Stop metformin 2 days prior to surgery    ____ Take 1/2 of usual  insulin dose the night before surgery and none on the morning of surgery.   ____ Stop aspirin or coumadin, or plavix  __ Stop Anti-inflammatories such as Advil, Aleve, Ibuprofen, Motrin, Naproxen,          Naprosyn, Goodies powders or aspirin products. Ok to take Tylenol.   ____ Stop supplements until after surgery.    ____ Bring C-Pap to the hospital.

## 2015-12-24 ENCOUNTER — Encounter
Admission: RE | Admit: 2015-12-24 | Discharge: 2015-12-24 | Disposition: A | Discharge status: Home / Self Care | Payer: Medicare Other | Source: Ambulatory Visit | Attending: General Surgery | Admitting: General Surgery

## 2015-12-24 DIAGNOSIS — C50911 Malignant neoplasm of unspecified site of right female breast: Secondary | ICD-10-CM | POA: Insufficient documentation

## 2015-12-24 DIAGNOSIS — Z01812 Encounter for preprocedural laboratory examination: Secondary | ICD-10-CM | POA: Diagnosis not present

## 2015-12-24 DIAGNOSIS — Z01818 Encounter for other preprocedural examination: Secondary | ICD-10-CM | POA: Diagnosis not present

## 2015-12-24 DIAGNOSIS — Z950 Presence of cardiac pacemaker: Secondary | ICD-10-CM | POA: Diagnosis not present

## 2015-12-24 DIAGNOSIS — J449 Chronic obstructive pulmonary disease, unspecified: Secondary | ICD-10-CM | POA: Diagnosis not present

## 2015-12-24 DIAGNOSIS — I252 Old myocardial infarction: Secondary | ICD-10-CM | POA: Diagnosis not present

## 2015-12-24 HISTORY — DX: Chronic obstructive pulmonary disease, unspecified: J44.9

## 2015-12-24 HISTORY — DX: Acute myocardial infarction, unspecified: I21.9

## 2015-12-24 HISTORY — DX: Unspecified osteoarthritis, unspecified site: M19.90

## 2015-12-24 LAB — BASIC METABOLIC PANEL
ANION GAP: 8 (ref 5–15)
BUN: 17 mg/dL (ref 6–20)
CO2: 26 mmol/L (ref 22–32)
Calcium: 10 mg/dL (ref 8.9–10.3)
Chloride: 99 mmol/L — ABNORMAL LOW (ref 101–111)
Creatinine, Ser: 0.98 mg/dL (ref 0.44–1.00)
GFR, EST NON AFRICAN AMERICAN: 58 mL/min — AB (ref >60)
GLUCOSE: 119 mg/dL — AB (ref 65–99)
POTASSIUM: 3.8 mmol/L (ref 3.5–5.1)
SODIUM: 133 mmol/L — AB (ref 135–145)

## 2015-12-26 DIAGNOSIS — C50919 Malignant neoplasm of unspecified site of unspecified female breast: Secondary | ICD-10-CM

## 2015-12-26 HISTORY — DX: Malignant neoplasm of unspecified site of unspecified female breast: C50.919

## 2016-01-01 ENCOUNTER — Encounter: Payer: Self-pay | Admitting: *Deleted

## 2016-01-01 ENCOUNTER — Encounter: Payer: Self-pay | Admitting: Family Medicine

## 2016-01-01 NOTE — Pre-Procedure Instructions (Signed)
Notes Encounter Date: 09/07/2015 11:13 AM Abner Greenspan, MD  Family Medicine      Subjective:    Patient ID: Diana Cook, female    DOB: [birth year], 65 y.o.   MRN: [id number]  HPI Here for annual f/u of acute and chronic medical problems   Had AMW with Lesia on 6/1-overall doing well  Given PPSV23 Hep C screening -declines/not high risk   Zoster vaccine-discussed with Katha Cabal  On husb ins and his was paid for here  Wants to get it today   dexa 3/08-would like to get one this year   Perhaps a little bone loss on last one/unsure Has lost some ht Used to smoke  No falls  No broken bones Was taking ca and D= held it due to bruising/wondered if that caused it   Easy bruising- has not had labs    Mammogram 9/16 Normal Self breast exam - no lumps  Years ago had breast bx (benign area) - scar area of R breast has been hurting  Does wear under wire bra   Colonoscopy 8/12 Adenomatous polyps - 5 year recall  Nothing in the mail  Wants to set it up    Brought a sample of tretinoin cream 0.1 % for wrinkles  Wants refill  Uses sunscreeen  Knows ins will not cover it       Patient Active Problem List   Diagnosis Date Noted  . Estrogen deficiency 09/07/2015  . Bruising 09/07/2015  . Wrinkles 09/07/2015  . Ringworm of body 05/22/2015  . Post herpetic neuralgia 09/01/2014  . Encounter for Medicare annual wellness exam 07/11/2014  . Routine general medical examination at a health care facility 06/26/2013  . H/O small bowel obstruction 04/15/2013  . Dyspnea on exertion 02/10/2013  . Complete heart block ([place]) 11/03/2011  . [employer] 11/03/2011  . Lumbar spinal stenosis 09/02/2011  . Fibrocystic breast disease 08/12/2010  . POSTMENOPAUSAL STATUS 08/06/2009  . HIDRADENITIS SUPPURATIVA 06/26/2008  . HERNIATED DISC 12/14/2007  . SACROILIITIS, LEFT 12/02/2007  . BACK, LOWER, PAIN 12/02/2007  . HX, PERSONAL, COLONIC POLYPS 09/14/2006  .  Former smoker 06/10/2006  . ASTHMA 06/10/2006  . H/O idiopathic seizure 06/10/2006        Past Medical History  Diagnosis Date  . Asthma     as a child, mild now  . Seizure disorder ([place])   . Colon polyps     colonoscopy 7/08, tubular adenoma  . Tobacco abuse   . Allergic rhinitis   . Complete heart block ([place]) 8/13    s/p PPM implanted in [place]         Past Surgical History  Procedure Laterality Date  . Cataract extraction    . Gyn surgery  1993    hysterectomy- form endometriosis  . Laparoscopy    . Small intestine surgery  12/2000    for blockage  . Pacemaker insertion  10/24/11    [employer] Advantio dual chamber PPM implanted by Dr Bunnie Philips at [facility] in [place]  . Breast biopsy Right 11/2005    benign inflammatory changes         Social History  Substance Use Topics  . Smoking status: Former Smoker -- 0.40 packs/day for 40 years    Types: Cigarettes    Quit date: 11/11/2010  . Smokeless tobacco: Never Used  . Alcohol Use: 0.0 oz/week    0 Standard drinks or equivalent per week     Comment:  wine (pt said it's very occ)         Family History  Problem Relation Age of Onset  . Stroke Mother   . Heart disease Mother     MI  . Dementia Mother   . Coronary artery disease Father   . Parkinsonism Father         Allergies  Allergen Reactions  . Codeine     REACTION: nausea  . Morphine And Related     Nausea          Current Outpatient Prescriptions on File Prior to Visit  Medication Sig Dispense Refill  . acetaminophen (TYLENOL) 500 MG tablet Take 500 mg by mouth every 6 (six) hours as needed.    Marland Kitchen albuterol (PROVENTIL HFA;VENTOLIN HFA) 108 (90 Base) MCG/ACT inhaler Inhale 2 puffs into the lungs every 6 (six) hours as needed for wheezing or shortness of breath. 1 Inhaler 11  . Calcium-Magnesium-Vitamin D (CALCIUM 500 PO) Take 2 capsules by mouth 2 (two) times  daily.    . furosemide (LASIX) 20 MG tablet TAKE ONE TABLET BY MOUTH ONCE DAILY AS NEEDED 30 tablet 3  . tretinoin (RETIN-A) 0.1 % cream Apply topically 3 (three) times a week.    . zolpidem (AMBIEN) 10 MG tablet TAKE ONE-HALF TO ONE TABLET BY MOUTH AT BEDTIME AS NEEDED FOR SLEEP 30 tablet 3   No current facility-administered medications on file prior to visit.    Review of Systems Review of Systems  Constitutional: Negative for fever, appetite change, fatigue and unexpected weight change.  Eyes: Negative for pain and visual disturbance.  Respiratory: Negative for cough and shortness of breath.   Cardiovascular: Negative for cp or palpitations    Gastrointestinal: Negative for nausea, diarrhea and constipation.  Genitourinary: Negative for urgency and frequency.  Skin: Negative for pallor or rash   Neurological: Negative for weakness, light-headedness, numbness and headaches.  Hematological: Negative for adenopathy. Does not bruise/bleed easily.  Psychiatric/Behavioral: Negative for dysphoric mood. The patient is not nervous/anxious.         Objective:   Physical Exam  Constitutional: She appears well-developed and well-nourished. No distress.  Well appearing   HENT:  Head: Normocephalic and atraumatic.  Right Ear: External ear normal.  Left Ear: External ear normal.  Mouth/Throat: Oropharynx is clear and moist.  Eyes: Conjunctivae and EOM are normal. Pupils are equal, round, and reactive to light. No scleral icterus.  Neck: Normal range of motion. Neck supple. No JVD present. Carotid bruit is not present. No thyromegaly present.  Cardiovascular: Normal rate, regular rhythm, normal heart sounds and intact distal pulses.  Exam reveals no gallop.   Pacemaker noted in chest wall  Pulmonary/Chest: Effort normal and breath sounds normal. No respiratory distress. She has no wheezes. She has no rales. She exhibits no tenderness.  Abdominal: Soft. Bowel sounds are normal. She  exhibits no distension, no abdominal bruit and no mass. There is no tenderness.  Genitourinary: No breast swelling, tenderness, discharge or bleeding.  Breast exam: No mass, nodules, thickening, tenderness, bulging, retraction, inflamation, nipple discharge or skin changes noted.  No axillary or clavicular LA.    No tenderness today- but area of occ tenderness is around last bx scar  Musculoskeletal: Normal range of motion. She exhibits no edema or tenderness.  Lymphadenopathy:    She has no cervical adenopathy.  Neurological: She is alert. She has normal reflexes. No cranial nerve deficit. She exhibits normal muscle tone. Coordination normal.  Skin: Skin is warm  and dry. No rash noted. No erythema. No pallor.  Solar lentigines Thin skin  Facial wrinkles present   Psychiatric: She has a normal mood and affect.          Assessment & Plan:       Problem List Items Addressed This Visit        Cardiovascular and Mediastinum   Complete heart block (HCC) - Primary    Pt is doing well with pacemaker-no c/o         Other   Wrinkles    Pt uses tretinoin cream 0.1% daily  Also sunscreen  This was refilled Rev app way to use She understands this will be out of pocket      HX, PERSONAL, COLONIC POLYPS    Due for recall colonoscopy  Referred for that      Relevant Orders   Ambulatory referral to Gastroenterology   Estrogen deficiency    Ref for dexa/post menopausal female     Relevant Orders   DG Bone Density   Bruising    Will check cbc with platelet for easy bruising  Also has thinner skin /used to smoke She does not notice easy bleeding     Relevant Orders   CBC with Differential/Platelet (Completed)    Other Visit Diagnoses    Need for shingles vaccine        Relevant Orders    Varicella-zoster vaccine subcutaneous (Completed)          Electronically signed by Abner Greenspan, MD at 09/09/2015 10:08 AM      Office Visit  on 09/07/2015        Detailed Report

## 2016-01-01 NOTE — Pre-Procedure Instructions (Signed)
Progress Notes Encounter Date: 06/12/2015 12:31 PM Deboraha Sprang, MD  Cardiology         Patient Care Team: Abner Greenspan, MD as PCP - General   HPI  Diana Cook is a 65 y.o. female Seen by both Drs. Allred and Lovena Le in the past who developed complete heart block and underwent pacing fall 2013.  Catheterization reportedly demonstrated no coronary artery disease  Echocardiogram 9/13 demonstrated normal left ventricular ; this was repeated 9/14 with normal LV function  At her last office visit we reprogrammed rate response. She has been much better since then  Her daughter a year ago developed cancer. Side effects from chemotherapy at left her bedbound for the last year           Past Medical History  Diagnosis Date  . Asthma     as a child, mild now  . Seizure disorder ([place])   . Colon polyps     colonoscopy 7/08, tubular adenoma  . Tobacco abuse   . Allergic rhinitis   . Complete heart block ([place]) 8/13    s/p PPM implanted in [place]          Past Surgical History  Procedure Laterality Date  . Cataract extraction    . Gyn surgery  1993    hysterectomy- form endometriosis  . Laparoscopy    . Small intestine surgery  12/2000    for blockage  . Pacemaker insertion  10/24/11    [employer] Advantio dual chamber PPM implanted by Dr Bunnie Philips at [facility] in [place]  . Breast biopsy Right 11/2005    benign inflammatory changes          Current Outpatient Prescriptions  Medication Sig Dispense Refill  . acetaminophen (TYLENOL) 500 MG tablet Take 500 mg by mouth every 6 (six) hours as needed.    Marland Kitchen albuterol (PROVENTIL HFA;VENTOLIN HFA) 108 (90 BASE) MCG/ACT inhaler Inhale 2 puffs into the lungs every 6 (six) hours as needed for wheezing or shortness of breath. 1 Inhaler 2  . Calcium-Magnesium-Vitamin D (CALCIUM 500 PO) Take 2 capsules by mouth 2 (two) times daily.    . furosemide  (LASIX) 20 MG tablet TAKE ONE TABLET BY MOUTH ONCE DAILY AS NEEDED 30 tablet 3  . zolpidem (AMBIEN) 10 MG tablet TAKE ONE-HALF TO ONE TABLET BY MOUTH AT BEDTIME AS NEEDED FOR SLEEP 30 tablet 3   No current facility-administered medications for this visit.         Allergies  Allergen Reactions  . Codeine     REACTION: nausea  . Morphine And Related     Nausea     Review of Systems negative except from HPI and PMH  Physical Exam BP 104/60 mmHg  Pulse 65  Ht 5' 8.5" (1.74 m)  Wt 161 lb 8 oz (73.256 kg)  BMI 24.20 kg/m2 Well developed and well nourished in no acute distress HENT normal E scleral and icterus clear Neck Supple JVP flat; carotids brisk and full Clear to ausculation ,Regular rate and rhythm, no murmurs gallops or rub Soft with active bowel sounds No clubbing cyanosis  Edema Alert and oriented, grossly normal motor and sensory function Skin Warm and Dry  ECG demonstrates P synchronous pacing  Assessment and  Plan  High-grade heart block  Pacemaker-[employer] The patient's device was interrogated.  The information was reviewed. No changes were made in the programming.    Sinus node dysfunction and chronotropic  incompetence     Overall she is doing much better. No adjustments at this time    Electronically signed by Deboraha Sprang, MD at 06/12/2015 1:51 PM      Office Visit on 06/12/2015        Detailed Report

## 2016-01-01 NOTE — Pre-Procedure Instructions (Signed)
[facility]  2D Echocardiogram without contrast  Order# [id number]  Ordering physician: Deboraha Sprang, MD Study date: 02/15/13  Result Notes   Notes Recorded by Stanton Kidney, RN on 02/22/2013 at 10:27 AM Reviewed results with patient who verbalized understanding.  ------  Notes Recorded by Deboraha Sprang, MD on 02/21/2013 at 4:51 PM Please Inform Patient Normal echo [facility] news thaksn      Study Result   Result status: Final result  *[facility]*       [address]          [place], [place] [address]            [phone]  ------------------------------------------------------------ Transthoracic Echocardiography  Patient:  Diana Cook, Diana Cook MR #:    [id number] Study Date: 02/15/2013 Gender:   F Age:    65 Height:   175.3cm Weight:   88.5kg BSA:    2.30m2 Pt. Status: Room:  PERFORMING  LVelora Heckler BWarrick KVirl AxeODwyane Luo SMickle MallorySONOGRAPHER TTimmothy Sours RDCS, RVT cc:  ------------------------------------------------------------ LV EF: 55% -  60%  ------------------------------------------------------------ History:  PMH: Acquired from the patient and from the patient's chart. Palpitations and dyspnea. Risk factors: Former tobacco use.  ------------------------------------------------------------ Study Conclusions  - Left ventricle: The cavity size was normal. Wall thickness was normal. Systolic function was normal. The estimated ejection fraction was in the range of 55% to 60%. Wall motion was normal; there were no regional wall motion abnormalities. Doppler parameters are consistent with abnormal left ventricular relaxation (grade 1 diastolic dysfunction). - Left atrium: The atrium was mildly dilated. - Tricuspid valve: Trivial regurgitation. - Pulmonary arteries: Systolic pressure was within  the normal range. Transthoracic echocardiography. M-mode, complete 2D, spectral Doppler, and color Doppler. Height: Height: 175.3cm. Height: 69in. Weight: Weight: 88.5kg. Weight: 194.6lb. Body mass index: BMI: 28.8kg/m^2. Body surface area:  BSA: 2.0775m. Blood pressure:   110/70. Patient status: Outpatient.  ------------------------------------------------------------  ------------------------------------------------------------ Left ventricle: The cavity size was normal. Wall thickness was normal. Systolic function was normal. The estimated ejection fraction was in the range of 55% to 60%. Wall motion was normal; there were no regional wall motion abnormalities. Doppler parameters are consistent with abnormal left ventricular relaxation (grade 1 diastolic dysfunction).  ------------------------------------------------------------ Aortic valve:  Trileaflet; normal thickness leaflets. Mobility was not restricted. Doppler: Transvalvular velocity was within the normal range. There was no stenosis. No regurgitation.  VTI ratio of LVOT to aortic valve: 0.6. Valve area: 2.48cm^2(VTI). Indexed valve area: 1.22cm^2/m^2 (VTI). Peak velocity ratio of LVOT to aortic valve: 0.63. Valve area: 2.6cm^2 (Vmax). Indexed valve area: 1.27cm^2/m^2 (Vmax).  Mean gradient: 75m71mg (S).  ------------------------------------------------------------ Aorta: Aortic root: The aortic root was normal in size.  ------------------------------------------------------------ Mitral valve:  Structurally normal valve.  Mobility was not restricted. Doppler: Transvalvular velocity was within the normal range. There was no evidence for stenosis. No regurgitation.  Peak gradient: 3mm31m (D).  ------------------------------------------------------------ Left atrium: The atrium was mildly dilated.  ------------------------------------------------------------ Right ventricle: The  cavity size was normal. Wall thickness was normal. Systolic function was normal.  ------------------------------------------------------------ Ventricular septum:  These changes are consistent with a left bundle branch block.  ------------------------------------------------------------ Pulmonic valve:  Doppler: Transvalvular velocity was within the normal range. There was no evidence for stenosis.  ------------------------------------------------------------ Tricuspid valve:  Structurally normal valve.  Doppler: Transvalvular velocity was within the normal range. Trivial regurgitation.  ------------------------------------------------------------ Pulmonary artery:  The main pulmonary artery was normal-sized. Systolic pressure  was within the normal range.  ------------------------------------------------------------ Right atrium: The atrium was normal in size.  ------------------------------------------------------------ Pericardium: There was no pericardial effusion.  ------------------------------------------------------------ Systemic veins: Inferior vena cava: The vessel was normal in size.  ------------------------------------------------------------  2D measurements    Normal Doppler measurements  Normal Left ventricle         LVOT LVID ED,  51.2 mm   43-52  Peak vel, 90.2 cm/s  ------ chord,             S PLAX              VTI, S   19.5 cm   ------ LVID ES,  35.2 mm   23-38  Aortic valve chord,             Peak vel,  144 cm/s  ------ PLAX              S FS, chord,  31 %   >29   Mean vel,  107 cm/s  ------ PLAX              S LVPW, ED  9.59 mm   ------ VTI, S   32.6 cm   ------ IVS/LVPW  0.8    <1.3  Mean     5 mm Hg ------ ratio, ED           gradient, Vol ED,   94 ml   ------ S MOD1              VTI  ratio  0.6    ------ Vol ES,   41 ml   ------ LVOT/AV MOD1              Area, VTI 2.48 cm^2  ------ EF, MOD1   56 %   ------ Area index 1.22 cm^2/m ------ Vol index,  46 ml/m^2 ------ (VTI)      ^2 ED, MOD1            Peak vel  0.63    ------ Vol index,  20 ml/m^2 ------ ratio, ES, MOD1            LVOT/AV Vol ED,   83 ml   ------ Area, Vmax 2.6 cm^2  ------ MOD2              Area index 1.27 cm^2/m ------ Vol ES,   26 ml   ------ (Vmax)     ^2 MOD2              Mitral valve EF, MOD2   69 %   ------ Peak E vel 88.4 cm/s  ------ Stroke    57 ml   ------ Peak A vel 119 cm/s  ------ vol, MOD2           Decelerati 172 ms   150-23 Vol index,  41 ml/m^2 ------ on time        0 ED, MOD2            Peak     3 mm Hg ------ Vol index,  13 ml/m^2 ------ gradient, ES, MOD2            D Stroke   27.9 ml/m^2 ------ Peak E/A  0.7    ------ index,             ratio MOD2              Tricuspid valve Ventricular septum       Regurg   241 cm/s  ------  IVS, ED  7.64 mm   ------ peak vel LVOT              Peak RV-RA  23 mm Hg ------ Diam, S   23 mm   ------ gradient, Area    4.15 cm^2  ------ S Aorta             Max regurg 241 cm/s  ------ Root diam,  36 mm   ------ vel ED               Pulmonic valve Left atrium          Peak vel, 84.7 cm/s  ------ AP dim    43 mm   ------ S AP dim   2.11 cm/m^2 <2.2 index Vol, S    46 ml   ------ Vol index, 22.5 ml/m^2 ------ S Right ventricle RVID ED,  25.9 mm   19-38 PLAX  ------------------------------------------------------------ Prepared and Electronically Authenticated by  Kathlyn Sacramento 2014-12-23T17:37:17.720  Patient Information    Patient Name Diana Cook, Diana Cook Sex Female DOB 07-29-1950 SSN [id number]  Reason For Exam  Priority: Routine  Dx: Complete heart block ([place]) [I44.2 (ICD-10-CM)]; Pacemaker-[employer] [Z95.0 (ICD-10-CM)]; SOB (shortness of breath) [R06.02 (ICD-10-CM)]  Surgical History   Surgical History   No past medical history on file.    Other Surgical History   Procedure Laterality Date Comment Source  ABDOMINAL HYSTERECTOMY    Provider  BREAST BIOPSY Right 11/2005 benign inflammatory changes Provider  CATARACT EXTRACTION    Provider  COLONOSCOPY  10/2015 Dr Ardis Hughs Provider  gyn surgery  1993 hysterectomy- form endometriosis Provider  LAPAROSCOPY    Provider  PACEMAKER INSERTION  10/24/11 [employer] Advantio dual chamber PPM implanted by Dr Bunnie Philips at [facility] in [place] Provider  [place]  12/2000 for blockage Provider    Performing Technologist/Nurse   Performing Technologist/Nurse:   Implants     Implant  Bost [id number] Advantiio [id number] - Implanted    Model/Cat number: [id number] ADVANTIIO Serial number: [id number]  Manufacturer: [place]    As of 09/11/2015    Status: Implanted      Bost 4456 Lyman Bishop [id number] - Implanted    Model/Cat number: [id number] Lyman Bishop Serial number: [id number]  Manufacturer: Wilkie Aye EP Brady    As of 03/12/2015    Status: Implanted      Bost 4479 Tharon Aquas [id number] - Implanted    Model/Cat number: [id number] Tharon Aquas Serial number: [id number]  Manufacturer: [place]    As of 03/12/2015    Status: Implanted      Order-Level Documents:   There are no order-level documents.  Encounter-Level Documents - 02/15/2013:   Electronic signature on 02/15/2013 2:20 PM      Printable Result Report   Result Report

## 2016-01-02 ENCOUNTER — Ambulatory Visit: Payer: Medicare Other | Admitting: Certified Registered"

## 2016-01-02 ENCOUNTER — Ambulatory Visit: Payer: Medicare Other

## 2016-01-02 ENCOUNTER — Ambulatory Visit
Admission: RE | Admit: 2016-01-02 | Discharge: 2016-01-02 | Disposition: A | Discharge status: Home / Self Care | Payer: Medicare Other | Source: Ambulatory Visit | Attending: General Surgery | Admitting: General Surgery

## 2016-01-02 ENCOUNTER — Encounter
Admission: RE | Disposition: A | Discharge status: Home / Self Care | Payer: Self-pay | Source: Ambulatory Visit | Attending: General Surgery

## 2016-01-02 ENCOUNTER — Encounter: Payer: Self-pay | Admitting: *Deleted

## 2016-01-02 DIAGNOSIS — I252 Old myocardial infarction: Secondary | ICD-10-CM | POA: Diagnosis not present

## 2016-01-02 DIAGNOSIS — Z170 Estrogen receptor positive status [ER+]: Principal | ICD-10-CM

## 2016-01-02 DIAGNOSIS — Z9849 Cataract extraction status, unspecified eye: Secondary | ICD-10-CM | POA: Diagnosis not present

## 2016-01-02 DIAGNOSIS — C50411 Malignant neoplasm of upper-outer quadrant of right female breast: Secondary | ICD-10-CM

## 2016-01-02 DIAGNOSIS — G40909 Epilepsy, unspecified, not intractable, without status epilepticus: Secondary | ICD-10-CM | POA: Insufficient documentation

## 2016-01-02 DIAGNOSIS — Z885 Allergy status to narcotic agent status: Secondary | ICD-10-CM | POA: Diagnosis not present

## 2016-01-02 DIAGNOSIS — Z8249 Family history of ischemic heart disease and other diseases of the circulatory system: Secondary | ICD-10-CM | POA: Diagnosis not present

## 2016-01-02 DIAGNOSIS — J449 Chronic obstructive pulmonary disease, unspecified: Secondary | ICD-10-CM | POA: Insufficient documentation

## 2016-01-02 DIAGNOSIS — R921 Mammographic calcification found on diagnostic imaging of breast: Secondary | ICD-10-CM | POA: Diagnosis not present

## 2016-01-02 DIAGNOSIS — Z950 Presence of cardiac pacemaker: Secondary | ICD-10-CM | POA: Insufficient documentation

## 2016-01-02 DIAGNOSIS — J45909 Unspecified asthma, uncomplicated: Secondary | ICD-10-CM | POA: Diagnosis not present

## 2016-01-02 DIAGNOSIS — N6310 Unspecified lump in the right breast, unspecified quadrant: Secondary | ICD-10-CM

## 2016-01-02 DIAGNOSIS — R920 Mammographic microcalcification found on diagnostic imaging of breast: Secondary | ICD-10-CM

## 2016-01-02 DIAGNOSIS — Z820 Family history of epilepsy and other diseases of the nervous system: Secondary | ICD-10-CM | POA: Diagnosis not present

## 2016-01-02 DIAGNOSIS — I442 Atrioventricular block, complete: Secondary | ICD-10-CM | POA: Insufficient documentation

## 2016-01-02 DIAGNOSIS — Z8049 Family history of malignant neoplasm of other genital organs: Secondary | ICD-10-CM | POA: Insufficient documentation

## 2016-01-02 DIAGNOSIS — C50911 Malignant neoplasm of unspecified site of right female breast: Secondary | ICD-10-CM | POA: Diagnosis not present

## 2016-01-02 DIAGNOSIS — Z86010 Personal history of colonic polyps: Secondary | ICD-10-CM | POA: Insufficient documentation

## 2016-01-02 DIAGNOSIS — Z823 Family history of stroke: Secondary | ICD-10-CM | POA: Insufficient documentation

## 2016-01-02 DIAGNOSIS — Z79899 Other long term (current) drug therapy: Secondary | ICD-10-CM | POA: Diagnosis not present

## 2016-01-02 DIAGNOSIS — Z87891 Personal history of nicotine dependence: Secondary | ICD-10-CM | POA: Insufficient documentation

## 2016-01-02 DIAGNOSIS — N6489 Other specified disorders of breast: Secondary | ICD-10-CM | POA: Diagnosis not present

## 2016-01-02 HISTORY — PX: BREAST BIOPSY: SHX20

## 2016-01-02 HISTORY — PX: BREAST EXCISIONAL BIOPSY: SUR124

## 2016-01-02 HISTORY — PX: BREAST LUMPECTOMY WITH SENTINEL LYMPH NODE BIOPSY: SHX5597

## 2016-01-02 HISTORY — PX: BREAST LUMPECTOMY: SHX2

## 2016-01-02 SURGERY — BREAST LUMPECTOMY WITH SENTINEL LYMPH NODE BX
Anesthesia: General | Site: Breast | Laterality: Right | Wound class: Clean

## 2016-01-02 MED ORDER — KETOROLAC TROMETHAMINE 30 MG/ML IJ SOLN
INTRAMUSCULAR | Status: DC | PRN
  Administered 2016-01-02: 30 mg via INTRAVENOUS

## 2016-01-02 MED ORDER — FENTANYL CITRATE (PF) 100 MCG/2ML IJ SOLN
25.0000 ug | INTRAMUSCULAR | Status: AC | PRN
  Administered 2016-01-02 (×6): 25 ug via INTRAVENOUS

## 2016-01-02 MED ORDER — METHYLENE BLUE 0.5 % INJ SOLN
INTRAVENOUS | Status: AC
  Filled 2016-01-02: qty 10

## 2016-01-02 MED ORDER — FAMOTIDINE 20 MG PO TABS
20.0000 mg | ORAL_TABLET | Freq: Once | ORAL | Status: AC
  Administered 2016-01-02: 20 mg via ORAL

## 2016-01-02 MED ORDER — ACETAMINOPHEN 10 MG/ML IV SOLN
INTRAVENOUS | Status: AC
  Filled 2016-01-02: qty 100

## 2016-01-02 MED ORDER — PROPOFOL 10 MG/ML IV BOLUS
INTRAVENOUS | Status: DC | PRN
  Administered 2016-01-02: 170 mg via INTRAVENOUS

## 2016-01-02 MED ORDER — TRAMADOL HCL 50 MG PO TABS
50.0000 mg | ORAL_TABLET | Freq: Once | ORAL | Status: AC
  Administered 2016-01-02: 50 mg via ORAL

## 2016-01-02 MED ORDER — TRAMADOL HCL 50 MG PO TABS: 50.0000 mg | ORAL_TABLET | ORAL | 0 refills | Status: DC | PRN

## 2016-01-02 MED ORDER — FENTANYL CITRATE (PF) 100 MCG/2ML IJ SOLN
INTRAMUSCULAR | Status: AC
  Administered 2016-01-02: 25 ug via INTRAVENOUS
  Filled 2016-01-02: qty 2

## 2016-01-02 MED ORDER — MIDAZOLAM HCL 5 MG/5ML IJ SOLN
INTRAMUSCULAR | Status: DC | PRN
  Administered 2016-01-02: 2 mg via INTRAVENOUS

## 2016-01-02 MED ORDER — LIDOCAINE HCL (CARDIAC) 20 MG/ML IV SOLN
INTRAVENOUS | Status: DC | PRN
  Administered 2016-01-02: 60 mg via INTRAVENOUS

## 2016-01-02 MED ORDER — FENTANYL CITRATE (PF) 100 MCG/2ML IJ SOLN
INTRAMUSCULAR | Status: DC | PRN
  Administered 2016-01-02: 50 ug via INTRAVENOUS
  Administered 2016-01-02: 25 ug via INTRAVENOUS
  Administered 2016-01-02: 50 ug via INTRAVENOUS
  Administered 2016-01-02 (×3): 25 ug via INTRAVENOUS

## 2016-01-02 MED ORDER — FAMOTIDINE 20 MG PO TABS
ORAL_TABLET | ORAL | Status: AC
  Administered 2016-01-02: 20 mg via ORAL
  Filled 2016-01-02: qty 1

## 2016-01-02 MED ORDER — ONDANSETRON HCL 4 MG/2ML IJ SOLN: 4.0000 mg | Freq: Once | INTRAMUSCULAR | Status: DC | PRN

## 2016-01-02 MED ORDER — PHENYLEPHRINE HCL 10 MG/ML IJ SOLN
INTRAMUSCULAR | Status: DC | PRN
  Administered 2016-01-02 (×2): 100 ug via INTRAVENOUS

## 2016-01-02 MED ORDER — LACTATED RINGERS IV SOLN
INTRAVENOUS | Status: DC
  Administered 2016-01-02: 50 mL/h via INTRAVENOUS
  Administered 2016-01-02: 14:00:00 via INTRAVENOUS

## 2016-01-02 MED ORDER — BUPIVACAINE-EPINEPHRINE (PF) 0.5% -1:200000 IJ SOLN
INTRAMUSCULAR | Status: DC | PRN
Start: 2016-01-02 — End: 2016-01-02
  Administered 2016-01-02: 13 mL via PERINEURAL
  Administered 2016-01-02: 17 mL via PERINEURAL

## 2016-01-02 MED ORDER — DEXAMETHASONE SODIUM PHOSPHATE 10 MG/ML IJ SOLN
INTRAMUSCULAR | Status: DC | PRN
  Administered 2016-01-02: 5 mg via INTRAVENOUS

## 2016-01-02 MED ORDER — ONDANSETRON HCL 4 MG/2ML IJ SOLN
INTRAMUSCULAR | Status: DC | PRN
  Administered 2016-01-02: 4 mg via INTRAVENOUS

## 2016-01-02 MED ORDER — ACETAMINOPHEN 10 MG/ML IV SOLN
INTRAVENOUS | Status: DC | PRN
Start: 2016-01-02 — End: 2016-01-02
  Administered 2016-01-02: 1000 mg via INTRAVENOUS

## 2016-01-02 MED ORDER — TECHNETIUM TC 99M SULFUR COLLOID
1.0750 | Freq: Once | INTRAVENOUS | Status: AC | PRN
  Administered 2016-01-02: 1.075 via INTRAVENOUS

## 2016-01-02 MED ORDER — BUPIVACAINE-EPINEPHRINE (PF) 0.5% -1:200000 IJ SOLN
INTRAMUSCULAR | Status: AC
  Filled 2016-01-02: qty 30

## 2016-01-02 MED ORDER — TRAMADOL HCL 50 MG PO TABS
ORAL_TABLET | ORAL | Status: AC
  Filled 2016-01-02: qty 1

## 2016-01-02 SURGICAL SUPPLY — 54 items
BANDAGE ELASTIC 6 LF NS (GAUZE/BANDAGES/DRESSINGS) IMPLANT
BLADE SURG 15 STRL SS SAFETY (BLADE) ×6 IMPLANT
BNDG GAUZE 4.5X4.1 6PLY STRL (MISCELLANEOUS) IMPLANT
BRA SURGICAL LRG (MISCELLANEOUS) ×3 IMPLANT
BULB RESERV EVAC DRAIN JP 100C (MISCELLANEOUS) IMPLANT
CANISTER SUCT 1200ML W/VALVE (MISCELLANEOUS) ×3 IMPLANT
CHLORAPREP W/TINT 26ML (MISCELLANEOUS) ×3 IMPLANT
CNTNR SPEC 2.5X3XGRAD LEK (MISCELLANEOUS) ×2
CONT SPEC 4OZ STER OR WHT (MISCELLANEOUS) ×1
CONTAINER SPEC 2.5X3XGRAD LEK (MISCELLANEOUS) ×2 IMPLANT
COVER LIGHT HANDLE STERIS (MISCELLANEOUS) ×3 IMPLANT
COVER PROBE FLX POLY STRL (MISCELLANEOUS) ×3 IMPLANT
DEVICE DUBIN SPECIMEN MAMMOGRA (MISCELLANEOUS) ×6 IMPLANT
DRAIN CHANNEL JP 15F RND 16 (MISCELLANEOUS) IMPLANT
DRAPE LAPAROTOMY 100X77 ABD (DRAPES) ×3 IMPLANT
DRAPE LAPAROTOMY TRNSV 106X77 (MISCELLANEOUS) ×3 IMPLANT
DRESSING TELFA 4X3 1S ST N-ADH (GAUZE/BANDAGES/DRESSINGS) ×3 IMPLANT
DRSG TELFA 3X8 NADH (GAUZE/BANDAGES/DRESSINGS) IMPLANT
ELECT CAUTERY BLADE TIP 2.5 (TIP) ×3
ELECT REM PT RETURN 9FT ADLT (ELECTROSURGICAL) ×3
ELECTRODE CAUTERY BLDE TIP 2.5 (TIP) ×2 IMPLANT
ELECTRODE REM PT RTRN 9FT ADLT (ELECTROSURGICAL) ×2 IMPLANT
GAUZE FLUFF 18X24 1PLY STRL (GAUZE/BANDAGES/DRESSINGS) ×6 IMPLANT
GAUZE SPONGE 4X4 12PLY STRL (GAUZE/BANDAGES/DRESSINGS) ×3 IMPLANT
GLOVE BIO SURGEON STRL SZ7.5 (GLOVE) ×3 IMPLANT
GLOVE INDICATOR 8.0 STRL GRN (GLOVE) ×3 IMPLANT
GOWN STRL REUS W/ TWL LRG LVL3 (GOWN DISPOSABLE) ×4 IMPLANT
GOWN STRL REUS W/TWL LRG LVL3 (GOWN DISPOSABLE) ×2
HARMONIC SCALPEL FOCUS (MISCELLANEOUS) IMPLANT
KIT RM TURNOVER STRD PROC AR (KITS) ×3 IMPLANT
LABEL OR SOLS (LABEL) ×3 IMPLANT
MARGIN MAP 10MM (MISCELLANEOUS) ×3 IMPLANT
NDL SAFETY 22GX1.5 (NEEDLE) ×3 IMPLANT
NEEDLE HYPO 25X1 1.5 SAFETY (NEEDLE) ×6 IMPLANT
PACK BASIN MINOR ARMC (MISCELLANEOUS) ×3 IMPLANT
SHEARS FOC LG CVD HARMONIC 17C (MISCELLANEOUS) IMPLANT
SLEVE PROBE SENORX GAMMA FIND (MISCELLANEOUS) ×3 IMPLANT
STRIP CLOSURE SKIN 1/2X4 (GAUZE/BANDAGES/DRESSINGS) ×3 IMPLANT
SUT ETHILON 3-0 FS-10 30 BLK (SUTURE) ×3
SUT SILK 2 0 (SUTURE) ×1
SUT SILK 2-0 18XBRD TIE 12 (SUTURE) ×2 IMPLANT
SUT VIC AB 2-0 CT1 27 (SUTURE) ×1
SUT VIC AB 2-0 CT1 TAPERPNT 27 (SUTURE) ×2 IMPLANT
SUT VIC AB 3-0 SH 27 (SUTURE) ×1
SUT VIC AB 3-0 SH 27X BRD (SUTURE) ×2 IMPLANT
SUT VIC AB 4-0 FS2 27 (SUTURE) ×6 IMPLANT
SUT VICRYL+ 3-0 144IN (SUTURE) ×3 IMPLANT
SUTURE EHLN 3-0 FS-10 30 BLK (SUTURE) ×2 IMPLANT
SWABSTK COMLB BENZOIN TINCTURE (MISCELLANEOUS) ×3 IMPLANT
SYR BULB IRRIG 60ML STRL (SYRINGE) ×3 IMPLANT
SYR CONTROL 10ML (SYRINGE) ×3 IMPLANT
SYRINGE 10CC LL (SYRINGE) ×3 IMPLANT
TAPE TRANSPORE STRL 2 31045 (GAUZE/BANDAGES/DRESSINGS) ×3 IMPLANT
WATER STERILE IRR 1000ML POUR (IV SOLUTION) ×3 IMPLANT

## 2016-01-02 NOTE — Transfer of Care (Signed)
Immediate Anesthesia Transfer of Care Note  Patient: Diana Cook  Procedure(s) Performed: Procedure(s): BREAST LUMPECTOMY WITH SENTINEL LYMPH NODE BX (Right) BREAST BIOPSY WITH BRACKETING WIRES (Left)  Patient Location: PACU  Anesthesia Type:General  Level of Consciousness: awake and patient cooperative  Airway & Oxygen Therapy: Patient Spontanous Breathing and Patient connected to face mask oxygen  Post-op Assessment: Report given to RN and Post -op Vital signs reviewed and stable  Post vital signs: Reviewed and stable  Last Vitals:  Vitals:   01/02/16 0952 01/02/16 1446  BP: 93/60 122/60  Pulse: 65 66  Resp: 16 13  Temp: 36.4 C 37.1 C    Last Pain:  Vitals:   01/02/16 0952  TempSrc: Tympanic  PainSc: 0-No pain      Patients Stated Pain Goal: 0 (35/00/93 8182)  Complications: No apparent anesthesia complications

## 2016-01-02 NOTE — Anesthesia Postprocedure Evaluation (Signed)
Anesthesia Post Note  Patient: Diana Cook  Procedure(s) Performed: Procedure(s) (LRB): BREAST LUMPECTOMY WITH SENTINEL LYMPH NODE BX (Right) BREAST BIOPSY WITH BRACKETING WIRES (Left)  Patient location during evaluation: PACU Anesthesia Type: General Level of consciousness: awake Pain management: pain level controlled Vital Signs Assessment: post-procedure vital signs reviewed and stable Respiratory status: spontaneous breathing Cardiovascular status: stable Anesthetic complications: no    Last Vitals:  Vitals:   01/02/16 0952 01/02/16 1446  BP: 93/60 122/60  Pulse: 65 66  Resp: 16 13  Temp: 36.4 C 37.1 C    Last Pain:  Vitals:   01/02/16 1446  TempSrc:   PainSc: 0-No pain                 VAN STAVEREN,Jamarrion Budai

## 2016-01-02 NOTE — Anesthesia Procedure Notes (Signed)
Procedure Name: LMA Insertion Date/Time: 01/02/2016 1:03 PM Performed by: Dionne Bucy Pre-anesthesia Checklist: Patient identified, Patient being monitored, Timeout performed, Emergency Drugs available and Suction available Patient Re-evaluated:Patient Re-evaluated prior to inductionOxygen Delivery Method: Circle system utilized Preoxygenation: Pre-oxygenation with 100% oxygen Intubation Type: IV induction Ventilation: Mask ventilation without difficulty LMA: LMA inserted LMA Size: 4.0 Tube type: Oral Number of attempts: 1 Placement Confirmation: positive ETCO2 and breath sounds checked- equal and bilateral Tube secured with: Tape Dental Injury: Teeth and Oropharynx as per pre-operative assessment

## 2016-01-02 NOTE — Discharge Instructions (Signed)

## 2016-01-02 NOTE — Op Note (Signed)
Preoperative diagnosis: 1) right  breast cancer; 2) left breast microcalcifications  Postoperative diagnosis: Same.  Operative procedure: 1) Ultrasound-guided wide excision right breast cancer with mastoplasty, sentinel node biopsy; 2) ultrasound/ wire localization guided excision left breast microcalcifications.  Operating surgeon: Ollen Bowl, M.D.  Anesthesia: Gen. by LMA, Marcaine 0.5% with 1-200,000 of epinephrine, 30 mL.  Estimated blood loss: 50 mL.  Clinical note: This 65 year old woman recently had an abnormal mammogram that was suspicious in an area where a palpable mass was clinically evident. Previous core biopsy showed invasive mammary current surgeon Oma. The patient desired breast conservation. She was injected with technetium sulfur colloid prior to procedure. Incidentally noted in the left breast were a 6 cm band of microcalcifications in the 9:00 position. She underwent wire localization to these could be removed en bloc.  Operative note: With the patient under adequate general anesthesia the breast chest and axilla were prepped with ChloraPrep and draped. Care was taken not to dislodge the localizing wires. The right breast was approached first. Ultrasound was used to outline the borders of the 1.5+ centimeter tumor at the 10:00 position approximate 4 cm from the nipple. Local anesthetic was infiltrated. A curvilinear incision was used and the skin was incised sharply. Breast flaps were then elevated and a block of tissue proximally 3 x 3 x 4 cm was excised orientated and specimen radiograph confirmed the previously placed clip was within the specimen. Attention was then turned to the axilla. Making use of the node seeker of the area of increased uptake could be identified through the wide excision site. A single hot node was identified with counts of 3000. Scanning through the axilla after this was removed showed no additional areas of increased uptake. Hemostasis was well to  cautery. The breast parenchyma was approximated in layers with interrupted 2-0 Vicryl figure-of-eight sutures. The skin was then closed with a running 4-0 Vicryl septic suture. The pathologist confirmed that margins were grossly negative and the biopsy cavity and clip were identified.  Attention was turned to the left breast. The previously placed localizing wires bracketed the calcifications which extended from the base of the adipose layer into the breast parenchyma encompassing a 6 cm long area when examined in the cc view. Local anesthesia was infiltrated and a radial incision made at the 9:00 position. The skin was incised sharply in the adipose tissue elevated off the underlying breast parenchyma. Both localizing wires were brought into the field and a 2 x 4 x 7 cm block of tissue extending to the retroareolar area and just posterior to the nipple was excised orientated and specimen radiograph obtained showing both wire tips in place. He breast parenchyma was then freed from the underlying pectoralis fashion approximated with interrupted 2-0 Vicryl figure-of-eight sutures. The skin was then closed with a running 4-0 Vicryl septic suture.  Benzoin and Steri-Strips followed by Telfa dressings were applied to both wounds. Surgical bra with fluffed gauze was applied.   The patient was taken to recovery in stable condition.

## 2016-01-02 NOTE — H&P (Signed)
For excision of left breast microcalcifications, right breast malignancy with SLN biopsy. Possibility of loss of nipple areolar complex reviewed. No change in clinical history or exam.

## 2016-01-02 NOTE — Anesthesia Preprocedure Evaluation (Signed)
Anesthesia Evaluation  Patient identified by MRN, date of birth, ID band Patient awake    Reviewed: Allergy & Precautions, NPO status , Patient's Chart, lab work & pertinent test results  Airway Mallampati: II       Dental  (+) Teeth Intact, Caps   Pulmonary COPD, former smoker,    breath sounds clear to auscultation       Cardiovascular + Past MI  + pacemaker  Rhythm:Regular Rate:Normal     Neuro/Psych Seizures -, Well Controlled,     GI/Hepatic negative GI ROS, Neg liver ROS,   Endo/Other  negative endocrine ROS  Renal/GU negative Renal ROS     Musculoskeletal   Abdominal Normal abdominal exam  (+)   Peds  Hematology   Anesthesia Other Findings   Reproductive/Obstetrics                             Anesthesia Physical Anesthesia Plan  ASA: III  Anesthesia Plan: General   Post-op Pain Management:    Induction: Intravenous  Airway Management Planned: LMA  Additional Equipment:   Intra-op Plan:   Post-operative Plan:   Informed Consent: I have reviewed the patients History and Physical, chart, labs and discussed the procedure including the risks, benefits and alternatives for the proposed anesthesia with the patient or authorized representative who has indicated his/her understanding and acceptance.     Plan Discussed with: CRNA  Anesthesia Plan Comments:         Anesthesia Quick Evaluation

## 2016-01-03 ENCOUNTER — Encounter: Payer: Self-pay | Admitting: General Surgery

## 2016-01-04 ENCOUNTER — Other Ambulatory Visit: Payer: Self-pay | Admitting: General Surgery

## 2016-01-04 ENCOUNTER — Ambulatory Visit
Admission: RE | Admit: 2016-01-04 | Discharge: 2016-01-04 | Disposition: A | Discharge status: Home / Self Care | Payer: Medicare Other | Source: Ambulatory Visit | Attending: General Surgery | Admitting: General Surgery

## 2016-01-04 ENCOUNTER — Telehealth: Payer: Self-pay | Admitting: *Deleted

## 2016-01-04 ENCOUNTER — Encounter: Payer: Self-pay | Admitting: General Surgery

## 2016-01-04 DIAGNOSIS — R921 Mammographic calcification found on diagnostic imaging of breast: Secondary | ICD-10-CM

## 2016-01-04 NOTE — Telephone Encounter (Signed)
Patient called in this morning and she feels sick to her stomach, no vomiting or fever. She states she is taking the pain medication every four hours. Advised patient to try not to take the pain medication every four in less she needs it. Told her it could be the waking up from the anesthesia.

## 2016-01-06 LAB — SURGICAL PATHOLOGY

## 2016-01-07 ENCOUNTER — Ambulatory Visit: Payer: Medicare Other | Admitting: General Surgery

## 2016-01-07 ENCOUNTER — Inpatient Hospital Stay (INDEPENDENT_AMBULATORY_CARE_PROVIDER_SITE_OTHER): Payer: Medicare Other

## 2016-01-07 ENCOUNTER — Encounter: Payer: Self-pay | Admitting: General Surgery

## 2016-01-07 VITALS — BP 122/78 | HR 80 | Resp 14 | Ht 68.0 in | Wt 171.0 lb

## 2016-01-07 DIAGNOSIS — N6310 Unspecified lump in the right breast, unspecified quadrant: Secondary | ICD-10-CM

## 2016-01-07 DIAGNOSIS — C50411 Malignant neoplasm of upper-outer quadrant of right female breast: Secondary | ICD-10-CM

## 2016-01-07 DIAGNOSIS — R920 Mammographic microcalcification found on diagnostic imaging of breast: Secondary | ICD-10-CM

## 2016-01-07 NOTE — Progress Notes (Signed)
Patient ID: Diana Cook, female   DOB: 12/17/1950, 65 y.o.   MRN: [id number]  Chief Complaint  Patient presents with  . Routine Post Op    left lumpectomy    HPI Diana Cook is a 65 y.o. female here for a post op right lumpectomy and left breast biopsy done on 01/02/16. She reports that she is doing very well. She does report some nausea since surgery.  HPI  Past Medical History:  Diagnosis Date  . Allergic rhinitis   . Arthritis   . Asthma    as a child, mild now  . Cancer [facility])    breast  . Colon polyps    colonoscopy 7/08, tubular adenoma  . Complete heart block ([place]) 8/13   s/p PPM implanted in [facility]  . COPD (chronic obstructive pulmonary disease) ([place])   . Myocardial infarction 2011  . Pacemaker    2011  . Seizure disorder ([place])   . Seizures ([place])    first one was when she was 65 years old   . Small bowel obstruction    1988 and 2002  . Tobacco abuse     Past Surgical History:  Procedure Laterality Date  . ABDOMINAL HYSTERECTOMY    . BREAST BIOPSY Right 11/2005   benign inflammatory changes, mass due to underwire bra  . BREAST BIOPSY Left 01/02/2016   Procedure: BREAST BIOPSY WITH BRACKETING WIRES;  Surgeon: [name], MD;  Location: [facility] ORS;  Service: General;  Laterality: Left;  . BREAST LUMPECTOMY WITH SENTINEL LYMPH NODE BIOPSY Right 01/02/2016   Procedure: BREAST LUMPECTOMY WITH SENTINEL LYMPH NODE BX;  Surgeon: [name], MD;  Location: [facility] ORS;  Service: General;  Laterality: Right;  . CATARACT EXTRACTION Bilateral   . COLONOSCOPY  10/2015   Dr Ardis Hughs  . gyn surgery  1993   hysterectomy- form endometriosis  . LAPAROSCOPY    . PACEMAKER INSERTION  10/24/11   [employer] Advantio dual chamber PPM implanted by Dr Bunnie Philips at [facility] in [place]  . SMALL INTESTINE SURGERY  12/2000   for blockage    Family History  Problem Relation Age of Onset  . Stroke Mother   . Heart disease Mother     MI   . Dementia Mother   . Coronary artery disease Father   . Parkinsonism Father   . Cancer - Cervical Daughter 53  . Colon cancer Neg Hx   . Breast cancer Neg Hx     Social History Social History  Substance Use Topics  . Smoking status: Former Smoker    Packs/day: 0.40    Years: 40.00    Types: Cigarettes    Quit date: 11/11/2010  . Smokeless tobacco: Never Used  . Alcohol use 4.2 oz/week    7 Glasses of wine per week     Comment: wine (pt said it's very occ)    Allergies  Allergen Reactions  . Codeine Nausea And Vomiting    REACTION: nausea  . Morphine And Related Nausea Only    Nausea     Current Outpatient Prescriptions  Medication Sig Dispense Refill  . acetaminophen (TYLENOL) 500 MG tablet Take 500 mg by mouth every 6 (six) hours as needed.    Marland Kitchen albuterol (PROVENTIL HFA;VENTOLIN HFA) 108 (90 Base) MCG/ACT inhaler Inhale 2 puffs into the lungs every 6 (six) hours as needed for wheezing or shortness of breath. 1 Inhaler 11  . Calcium-Vitamin D-Vitamin K 283-662-94 MG-UNT-MCG  TABS Take 2 tablets by mouth daily.    . Diphenhydramine-APAP 25-500 MG TABS Take 1 tablet by mouth at bedtime as needed.    . furosemide (LASIX) 20 MG tablet TAKE ONE TABLET BY MOUTH ONCE DAILY AS NEEDED (Patient taking differently: TAKE ONE TABLET BY MOUTH ONCE DAILY AS NEEDED FOR FLUID RETENTION/SWELLING) 30 tablet 3  . tretinoin (RETIN-A) 0.1 % cream Apply topically at bedtime. As directed for facial wrinkles (Patient taking differently: Apply 1 application topically 3 (three) times a week. ) 20 g 3  . zolpidem (AMBIEN) 10 MG tablet TAKE ONE-HALF TO ONE TABLET BY MOUTH AT BEDTIME AS NEEDED FOR SLEEP (Patient taking differently: Take 5 mg by mouth at bedtime. TAKE ONE-HALF TO ONE TABLET BY MOUTH AT BEDTIME AS NEEDED FOR SLEEP) 30 tablet 3   No current facility-administered medications for this visit.     Review of Systems Review of Systems  Constitutional: Negative.   Respiratory: Negative.    Cardiovascular: Negative.     Blood pressure 122/78, pulse 80, resp. rate 14, height _0  (1.727 m), weight 171 lb (77.6 kg).  Physical Exam Physical Exam  Constitutional: She is oriented to person, place, and time. She appears well-developed and well-nourished.  Cardiovascular: Normal rate, regular rhythm and normal heart sounds.   Pulmonary/Chest: Effort normal and breath sounds normal.    Near symmetrical breast volume. Scant loss of height on the right breast mound status post resection.  Neurological: She is alert and oriented to person, place, and time.  Skin: Skin is warm and dry.    Data Reviewed A. BREAST, RIGHT, 10 O'CLOCK; WIDE EXCISION:  - INVASIVE MAMMARY CARCINOMA OF NO SPECIAL TYPE, 1.4 CM, GRADE 2.  - BIOPSY SITE AND MARKER CLIP PRESENT.  - ALL MARGINS APPEAR CLEAR.  ER IHC: Positive, 100%, strong staining intensity  PR IHC: Positive, 100%, strong staining intensity  HER2 FISH: Negative (not amplified), HER2/CEP17 ratio 1.18, average HER2  copy number per cell 3.0   B. BREAST, LEFT, 9 O'CLOCK; EXCISION WITH WIRE LOCALIZATION:  - COLUMNAR CELL CHANGE AND HYPERPLASIA ASSOCIATED WITH LUMINAL AND  STROMAL CALCIFICATIONS.  - NEGATIVE FOR ATYPIA AND MALIGNANCY.   C. SENTINEL LYMPH NODE #1, RIGHT AXILLA; EXCISION:  - NEGATIVE FOR MALIGNANCY, TWO LYMPH NODES (0/2).  pTNM: pT1c pN0 (sn)   Ultrasound examination was undertaken to determine if the patient would be a candidate for accelerated partial breast radiation. The area of the upper-outer quadrant of the right breast was evaluated. There is a small fluid collection above the pectoralis muscle status post reconstruction. In the more superficial tissue there is a 0.4 x 1.9 x 2.5 cm small seroma cavity approximately 1.2 cm below the skin surface. There is a very small area extending superficial to this that is 0.72 cm from the skin.  Possible candidate for MammoSite radiation.  Assessment    Doing well status  post wide excision of her right breast cancer to negative margins, negative sentinel node.  No evidence of left breast malignancy.    Plan    The patient's daughter is undergoing treatment for malignancy at [facility]. The patient is providing a good deal of support and help with transportation. There might be more flexibility with whole breast radiation over accelerated partial breast radiation, but this can be reviewed with the radiation oncologist.  The need for placement of a cavity evaluation device prior to placement of the treatment balloon was reviewed. I anticipate that they'll be adequate margins, but the  small superficial fluid collection may be in continuity with the deeper section if this is the case there would not be an adequate skin buffer.   Patient will be scheduled for an appointment with Dr. Baruch Gouty at the [facility].   We'll arrange for Mammoprint testing.  This has been scribed by Caryl-Lyn Otis Brace LPN    [name] 01/07/2016, 9:29 PM

## 2016-01-08 ENCOUNTER — Telehealth: Payer: Self-pay

## 2016-01-08 ENCOUNTER — Ambulatory Visit: Payer: Medicare Other

## 2016-01-08 ENCOUNTER — Telehealth: Payer: Self-pay | Admitting: *Deleted

## 2016-01-08 NOTE — Telephone Encounter (Signed)
-----   Message from [name], MD sent at 01/07/2016  9:36 PM EST ----- Please arrange for Mammoprint/blueprint testing on the and tore a 2 week waiting interval has passed. . Thank you

## 2016-01-08 NOTE — Telephone Encounter (Signed)
Patient notified of appointment with Dr Baruch Gouty at [facility] on 01/14/16 at 1:30 pm.

## 2016-01-08 NOTE — Telephone Encounter (Signed)
Order form completed and faxed to [facility].

## 2016-01-10 LAB — CUP PACEART REMOTE DEVICE CHECK
Battery Remaining Percentage: 71 %
Brady Statistic RA Percent Paced: 14 %
Implantable Lead Implant Date: 20130830
Implantable Lead Implant Date: 20130830
Implantable Lead Location: 753859
Implantable Lead Model: 4479
Implantable Lead Serial Number: 473325
Implantable Pulse Generator Implant Date: 20130830
Lead Channel Impedance Value: 429 Ohm
Lead Channel Impedance Value: 571 Ohm
Lead Channel Pacing Threshold Amplitude: 0.8 V
Lead Channel Pacing Threshold Pulse Width: 0.5 ms
Lead Channel Setting Pacing Amplitude: 2.4 V
MDC IDC LEAD LOCATION: 753860
MDC IDC LEAD SERIAL: 523784
MDC IDC MSMT BATTERY REMAINING LONGEVITY: 42 mo
MDC IDC SESS DTM: 20171017053100
MDC IDC SET LEADCHNL RA PACING AMPLITUDE: 2 V
MDC IDC SET LEADCHNL RV PACING PULSEWIDTH: 0.5 ms
MDC IDC SET LEADCHNL RV SENSING SENSITIVITY: 2.5 mV
MDC IDC STAT BRADY RV PERCENT PACED: 100 %
Pulse Gen Serial Number: 134132

## 2016-01-14 ENCOUNTER — Ambulatory Visit
Admission: RE | Admit: 2016-01-14 | Discharge: 2016-01-14 | Disposition: A | Discharge status: Home / Self Care | Payer: Medicare Other | Source: Ambulatory Visit | Attending: Radiation Oncology | Admitting: Radiation Oncology

## 2016-01-14 ENCOUNTER — Encounter: Payer: Self-pay | Admitting: Radiation Oncology

## 2016-01-14 VITALS — BP 123/72 | HR 67 | Temp 98.0°F | Ht 68.0 in | Wt 172.5 lb

## 2016-01-14 DIAGNOSIS — G40909 Epilepsy, unspecified, not intractable, without status epilepticus: Secondary | ICD-10-CM | POA: Diagnosis not present

## 2016-01-14 DIAGNOSIS — Z86010 Personal history of colonic polyps: Secondary | ICD-10-CM | POA: Insufficient documentation

## 2016-01-14 DIAGNOSIS — Z79899 Other long term (current) drug therapy: Secondary | ICD-10-CM | POA: Insufficient documentation

## 2016-01-14 DIAGNOSIS — Z8049 Family history of malignant neoplasm of other genital organs: Secondary | ICD-10-CM | POA: Insufficient documentation

## 2016-01-14 DIAGNOSIS — C50411 Malignant neoplasm of upper-outer quadrant of right female breast: Secondary | ICD-10-CM | POA: Insufficient documentation

## 2016-01-14 DIAGNOSIS — J449 Chronic obstructive pulmonary disease, unspecified: Secondary | ICD-10-CM | POA: Diagnosis not present

## 2016-01-14 DIAGNOSIS — J45909 Unspecified asthma, uncomplicated: Secondary | ICD-10-CM | POA: Diagnosis not present

## 2016-01-14 DIAGNOSIS — M129 Arthropathy, unspecified: Secondary | ICD-10-CM | POA: Insufficient documentation

## 2016-01-14 DIAGNOSIS — Z170 Estrogen receptor positive status [ER+]: Secondary | ICD-10-CM | POA: Insufficient documentation

## 2016-01-14 DIAGNOSIS — N62 Hypertrophy of breast: Secondary | ICD-10-CM | POA: Diagnosis not present

## 2016-01-14 DIAGNOSIS — I252 Old myocardial infarction: Secondary | ICD-10-CM | POA: Insufficient documentation

## 2016-01-14 DIAGNOSIS — Z510 Encounter for antineoplastic radiation therapy: Secondary | ICD-10-CM | POA: Diagnosis not present

## 2016-01-14 DIAGNOSIS — Z87891 Personal history of nicotine dependence: Secondary | ICD-10-CM | POA: Insufficient documentation

## 2016-01-14 DIAGNOSIS — Z8719 Personal history of other diseases of the digestive system: Secondary | ICD-10-CM | POA: Diagnosis not present

## 2016-01-14 NOTE — Consult Note (Signed)
NEW PATIENT EVALUATION  Name: Diana Cook  MRN: [id number]  Date:   01/14/2016     DOB: April 27, 1950   This 65 y.o. female patient presents to the clinic for initial evaluation of stage IA (T1 CN 0 M0) invasive mammary carcinoma of the right breast status post wide local excision and sentinel node biopsy tumor is strongly ER/PR positive.  REFERRING PHYSICIAN: Tower, Wynelle Fanny, MD  CHIEF COMPLAINT:  Chief Complaint  Patient presents with  . Breast Cancer    DIAGNOSIS: The encounter diagnosis was Malignant neoplasm of upper-outer quadrant of right breast in female, estrogen receptor positive ([place]).   PREVIOUS INVESTIGATIONS:  Mammograms ultrasound reviewed Pathology reports reviewed Clinical notes reviewed  HPI: Patient is a 65 year old female who presented with abnormal Matt abnormal mammograms of bilateral breasts. Right breast showed a 1.2 x 1.3 cm lesion in the right breast at the 10:00 position for which ultrasound-guided biopsy was recommended. The left breast showed punctate calcifications which again ultrasound biopsy was recommended. Patient will opt have bilateral biopsies and wide local excisions on both breasts. Left breast showed hyperplasia associated with luminal and stromal calcifications negative for atypia malignancy. Right breast showed a 1.4 cm grade 2 invasive mammary carcinoma ER/PR positive HER-2/neu not overexpressed. Margins were clear at 2 sentinel lymph nodes of the right were negative for metastatic disease. Closest margin was 2 mm. Patient's tissue of the right breast has been sent off for MammaPrint. She is seen today for consideration of radiation. She is doing well. Breasts are still somewhat sore. Surgeon is somewhat concerned about a small superficial fluid collection in continuity with the seroma cavity as well as close proximity to the skin surface. Patient is also having difficulty dealing with his daughter who is being treated for cervical cancer at [facility]. She is seen today for radiation oncology opinion.  PLANNED TREATMENT REGIMEN: Possible accelerated partial breast radiation  PAST MEDICAL HISTORY:  has a past medical history of Allergic rhinitis; Arthritis; Asthma; Cancer ([place]); Colon polyps; Complete heart block ([place]) (8/13); COPD (chronic obstructive pulmonary disease) ([place]); Myocardial infarction (2011); Pacemaker; Seizure disorder ([place]); Seizures ([place]); Small bowel obstruction; and Tobacco abuse.    PAST SURGICAL HISTORY:  Past Surgical History:  Procedure Laterality Date  . ABDOMINAL HYSTERECTOMY    . BREAST BIOPSY Right 11/2005   benign inflammatory changes, mass due to underwire bra  . BREAST BIOPSY Left 01/02/2016   Procedure: BREAST BIOPSY WITH BRACKETING WIRES;  Surgeon: [name], MD;  Location: [facility] ORS;  Service: General;  Laterality: Left;  . BREAST LUMPECTOMY WITH SENTINEL LYMPH NODE BIOPSY Right 01/02/2016   Procedure: BREAST LUMPECTOMY WITH SENTINEL LYMPH NODE BX;  Surgeon: [name], MD;  Location: [facility] ORS;  Service: General;  Laterality: Right;  . CATARACT EXTRACTION Bilateral   . COLONOSCOPY  10/2015   Dr Ardis Hughs  . gyn surgery  1993   hysterectomy- form endometriosis  . LAPAROSCOPY    . PACEMAKER INSERTION  10/24/11   [employer] Advantio dual chamber PPM implanted by Dr Bunnie Philips at [facility] in [name]  . SMALL INTESTINE SURGERY  12/2000   for blockage    FAMILY HISTORY: family history includes Cancer - Cervical (age of onset: 21) in her daughter; Coronary artery disease in her father; Dementia in her mother; Heart disease in her mother; Parkinsonism in her father; Stroke in her mother.  SOCIAL HISTORY:  reports that she quit smoking about 5 years ago. Her smoking  use included Cigarettes. She has a 16.00 pack-year smoking history. She has never used smokeless tobacco. She reports that she drinks about 4.2 oz of alcohol per week . She reports that she does not  use drugs.  ALLERGIES: Codeine and Morphine and related  MEDICATIONS:  Current Outpatient Prescriptions  Medication Sig Dispense Refill  . acetaminophen (TYLENOL) 500 MG tablet Take 500 mg by mouth every 6 (six) hours as needed.    Marland Kitchen albuterol (PROVENTIL HFA;VENTOLIN HFA) 108 (90 Base) MCG/ACT inhaler Inhale 2 puffs into the lungs every 6 (six) hours as needed for wheezing or shortness of breath. 1 Inhaler 11  . Calcium-Vitamin D-Vitamin K 536-644-03 MG-UNT-MCG TABS Take 2 tablets by mouth daily.    . Diphenhydramine-APAP 25-500 MG TABS Take 1 tablet by mouth at bedtime as needed.    . furosemide (LASIX) 20 MG tablet TAKE ONE TABLET BY MOUTH ONCE DAILY AS NEEDED (Patient taking differently: TAKE ONE TABLET BY MOUTH ONCE DAILY AS NEEDED FOR FLUID RETENTION/SWELLING) 30 tablet 3  . traMADol (ULTRAM) 50 MG tablet     . tretinoin (RETIN-A) 0.1 % cream Apply topically at bedtime. As directed for facial wrinkles (Patient taking differently: Apply 1 application topically 3 (three) times a week. ) 20 g 3  . zolpidem (AMBIEN) 10 MG tablet TAKE ONE-HALF TO ONE TABLET BY MOUTH AT BEDTIME AS NEEDED FOR SLEEP (Patient taking differently: Take 5 mg by mouth at bedtime. TAKE ONE-HALF TO ONE TABLET BY MOUTH AT BEDTIME AS NEEDED FOR SLEEP) 30 tablet 3   No current facility-administered medications for this encounter.     ECOG PERFORMANCE STATUS:  0 - Asymptomatic  REVIEW OF SYSTEMS:  Patient denies any weight loss, fatigue, weakness, fever, chills or night sweats. Patient denies any loss of vision, blurred vision. Patient denies any ringing  of the ears or hearing loss. No irregular heartbeat. Patient denies heart murmur or history of fainting. Patient denies any chest pain or pain radiating to her upper extremities. Patient denies any shortness of breath, difficulty breathing at night, cough or hemoptysis. Patient denies any swelling in the lower legs. Patient denies any nausea vomiting, vomiting of blood, or  coffee ground material in the vomitus. Patient denies any stomach pain. Patient states has had normal bowel movements no significant constipation or diarrhea. Patient denies any dysuria, hematuria or significant nocturia. Patient denies any problems walking, swelling in the joints or loss of balance. Patient denies any skin changes, loss of hair or loss of weight. Patient denies any excessive worrying or anxiety or significant depression. Patient denies any problems with insomnia. Patient denies excessive thirst, polyuria, polydipsia. Patient denies any swollen glands, patient denies easy bruising or easy bleeding. Patient denies any recent infections, allergies or URI. Patient "s visual fields have not changed significantly in recent time.    PHYSICAL EXAM: BP 123/72   Pulse 67   Temp 98 F (36.7 C)   Ht '5\' 8"'$  (1.727 m)   Wt 172 lb 8.5 oz (78.3 kg)   BMI 26.23 kg/m  Patient is status post bilateral wide local excisions and sentinel node biopsy on the right all of which are healing well no dominant mass or nodularity is noted in either breast in 2 positions examined. Well-developed well-nourished patient in NAD. HEENT reveals PERLA, EOMI, discs not visualized.  Oral cavity is clear. No oral mucosal lesions are identified. Neck is clear without evidence of cervical or supraclavicular adenopathy. Lungs are clear to A&P. Cardiac examination is essentially unremarkable  with regular rate and rhythm without murmur rub or thrill. Abdomen is benign with no organomegaly or masses noted. Motor sensory and DTR levels are equal and symmetric in the upper and lower extremities. Cranial nerves II through XII are grossly intact. Proprioception is intact. No peripheral adenopathy or edema is identified. No motor or sensory levels are noted. Crude visual fields are within normal range.  LABORATORY DATA: Pathology reports reviewed    RADIOLOGY RESULTS: Mammograms and ultrasound reviewed   IMPRESSION: Stage I a  invasive mammary carcinoma ER/PR positive of the right breast status post wide local excision and sentinel node biopsy in 65 year old female  PLAN: At this time I got over the risks and benefits of radiation therapy with the patient and her husband. I've gone over risks and benefits of both whole breast radiation as well as accelerated partial breast irradiation. Side effects such as skin reaction fatigue alteration of blood counts and problems with placing the MammoSite catheter should there be not enough skin distance all were discussed with the patient. Patient is in favor of accelerated partial breast radiation. We will try to arrange and coordinate with surgeon's office. Should that not be adequate space for the MammoSite balloon catheter will go to whole breast radiation. Also pending is her MammaPrint which we will review when it becomes available. Patient husband compress my treatment plan well.  I would like to take this opportunity to thank you for allowing me to participate in the care of your patient.Armstead Peaks., MD

## 2016-01-15 ENCOUNTER — Telehealth: Payer: Self-pay | Admitting: *Deleted

## 2016-01-15 ENCOUNTER — Encounter: Payer: Self-pay | Admitting: General Surgery

## 2016-01-15 NOTE — Telephone Encounter (Signed)
I talked with Gerald Stabs from [facility], he states that the hospital pathology sent in the surgery specimen not the core biopsy specimen as requested. In talking with him he states that they have both the core and surgery specimens and that the core specimen would not be enough to test. He says to send in a new order form tomorrow (14 days post surgery) and processing can begin.

## 2016-01-15 NOTE — Telephone Encounter (Signed)
Dr Bary Castilla aware.

## 2016-01-16 ENCOUNTER — Telehealth: Payer: Self-pay | Admitting: *Deleted

## 2016-01-16 ENCOUNTER — Encounter (HOSPITAL_COMMUNITY): Payer: Self-pay

## 2016-01-16 DIAGNOSIS — C50411 Malignant neoplasm of upper-outer quadrant of right female breast: Secondary | ICD-10-CM | POA: Diagnosis not present

## 2016-01-16 MED ORDER — CEFADROXIL 500 MG PO CAPS: 500.0000 mg | ORAL_CAPSULE | Freq: Two times a day (BID) | ORAL | 0 refills | Status: DC

## 2016-01-16 NOTE — Telephone Encounter (Signed)
Mammosite schedule reviewed with the patient Placement 01-22-16  at [facility] at 11:00 Scan 01-24-16 Treat Dec 1, 4, 5, 6 and 7 Aware the [facility] will be calling her for more details Aware of ATB and directions reviewed. Aware no showers and to wear her bra while mammosite in place. Pt agrees.

## 2016-01-21 ENCOUNTER — Telehealth: Payer: Self-pay | Admitting: *Deleted

## 2016-01-21 NOTE — Telephone Encounter (Signed)
  Oncology Nurse Navigator Documentation  Navigator Location: [facility] (01/21/16 1400)   )Navigator Encounter Type: Telephone (01/21/16 1400) Telephone: Outgoing Call (01/21/16 1400) Abnormal Finding Date: 12/04/15 (01/21/16 1400) Confirmed Diagnosis Date: 12/06/15 (01/21/16 1400) Surgery Date: 01/02/16 (01/21/16 1400)           Treatment Initiated Date: 01/02/16 (01/21/16 1400)         Interventions: Education (01/21/16 1400)            Acuity: Level 2 (01/21/16 1400)         Time Spent with Patient: 30 (01/21/16 1400)   Called patient and introduced to navigation services.  She is hoping to get radiation therapy via mammosite.  Sates she has the balloon placed on the 28th and sees Dr. Baruch Gouty on the 30th.  Will give her educational material at that time.  She is to call with any questions or needs.

## 2016-01-22 ENCOUNTER — Inpatient Hospital Stay: Payer: Self-pay

## 2016-01-22 ENCOUNTER — Ambulatory Visit (INDEPENDENT_AMBULATORY_CARE_PROVIDER_SITE_OTHER): Payer: Medicare Other | Admitting: General Surgery

## 2016-01-22 VITALS — BP 120/80 | HR 78 | Resp 12 | Ht 68.0 in | Wt 176.0 lb

## 2016-01-22 DIAGNOSIS — C50411 Malignant neoplasm of upper-outer quadrant of right female breast: Secondary | ICD-10-CM

## 2016-01-22 NOTE — Patient Instructions (Signed)
Patient care kit given to patient.  Instructed no showers, sponge bath while mammosite in place, take antibiotic. Follow up with [facility] as arranged. Discussed wearing your bra for support at all times.

## 2016-01-22 NOTE — Progress Notes (Signed)
Patient ID: Diana Cook, female   DOB: 17-Jun-1950, 65 y.o.   MRN: [id number]  Chief Complaint  Patient presents with  . Procedure    mammosite    HPI Diana Cook is a 65 y.o. female here today for her right mammosite placement.The patient met with radiation oncology and would like to proceed with accelerated partial breast radiation. The procedure was reviewed in detail. Need for appropriate spacing of the treatment balloon was emphasized. HPI  Past Medical History:  Diagnosis Date  . Allergic rhinitis   . Arthritis   . Asthma    as a child, mild now  . Cancer [facility])    breast  . Colon polyps    colonoscopy 7/08, tubular adenoma  . Complete heart block ([place]) 8/13   s/p PPM implanted in [facility]  . COPD (chronic obstructive pulmonary disease) ([place])   . Myocardial infarction 2011  . Pacemaker    2011  . Seizure disorder ([place])   . Seizures ([place])    first one was when she was 65 years old   . Small bowel obstruction    1988 and 2002  . Tobacco abuse     Past Surgical History:  Procedure Laterality Date  . ABDOMINAL HYSTERECTOMY    . BREAST BIOPSY Right 11/2005   benign inflammatory changes, mass due to underwire bra  . BREAST BIOPSY Left 01/02/2016   Procedure: BREAST BIOPSY WITH BRACKETING WIRES;  Surgeon: [name], MD;  Location: [facility] ORS;  Service: General;  Laterality: Left;  . BREAST LUMPECTOMY WITH SENTINEL LYMPH NODE BIOPSY Right 01/02/2016   Procedure: BREAST LUMPECTOMY WITH SENTINEL LYMPH NODE BX;  Surgeon: [name], MD;  Location: [facility] ORS;  Service: General;  Laterality: Right;  . CATARACT EXTRACTION Bilateral   . COLONOSCOPY  10/2015   Dr Ardis Hughs  . gyn surgery  1993   hysterectomy- form endometriosis  . LAPAROSCOPY    . PACEMAKER INSERTION  10/24/11   [employer] Advantio dual chamber PPM implanted by Dr Bunnie Philips at [facility] in [place]  . SMALL INTESTINE SURGERY  12/2000   for blockage    Family  History  Problem Relation Age of Onset  . Stroke Mother   . Heart disease Mother     MI  . Dementia Mother   . Coronary artery disease Father   . Parkinsonism Father   . Cancer - Cervical Daughter 28  . Colon cancer Neg Hx   . Breast cancer Neg Hx     Social History Social History  Substance Use Topics  . Smoking status: Former Smoker    Packs/day: 0.40    Years: 40.00    Types: Cigarettes    Quit date: 11/11/2010  . Smokeless tobacco: Never Used  . Alcohol use 4.2 oz/week    7 Glasses of wine per week     Comment: wine (pt said it's very occ)    Allergies  Allergen Reactions  . Codeine Nausea And Vomiting    REACTION: nausea  . Morphine And Related Nausea Only    Nausea     Current Outpatient Prescriptions  Medication Sig Dispense Refill  . acetaminophen (TYLENOL) 500 MG tablet Take 500 mg by mouth every 6 (six) hours as needed.    Marland Kitchen albuterol (PROVENTIL HFA;VENTOLIN HFA) 108 (90 Base) MCG/ACT inhaler Inhale 2 puffs into the lungs every 6 (six) hours as needed for wheezing or shortness of breath. 1 Inhaler 11  .  Calcium-Vitamin D-Vitamin K 670-141-03 MG-UNT-MCG TABS Take 2 tablets by mouth daily.    . cefadroxil (DURICEF) 500 MG capsule Take 1 capsule (500 mg total) by mouth 2 (two) times daily. Start one hour prior to office procedure on 01-22-16 24 capsule 0  . Diphenhydramine-APAP 25-500 MG TABS Take 1 tablet by mouth at bedtime as needed.    . furosemide (LASIX) 20 MG tablet TAKE ONE TABLET BY MOUTH ONCE DAILY AS NEEDED (Patient taking differently: TAKE ONE TABLET BY MOUTH ONCE DAILY AS NEEDED FOR FLUID RETENTION/SWELLING) 30 tablet 3  . traMADol (ULTRAM) 50 MG tablet     . tretinoin (RETIN-A) 0.1 % cream Apply topically at bedtime. As directed for facial wrinkles (Patient taking differently: Apply 1 application topically 3 (three) times a week. ) 20 g 3  . zolpidem (AMBIEN) 10 MG tablet TAKE ONE-HALF TO ONE TABLET BY MOUTH AT BEDTIME AS NEEDED FOR SLEEP (Patient  taking differently: Take 5 mg by mouth at bedtime. TAKE ONE-HALF TO ONE TABLET BY MOUTH AT BEDTIME AS NEEDED FOR SLEEP) 30 tablet 3   No current facility-administered medications for this visit.     Review of Systems Review of Systems  Blood pressure 120/80, pulse 78, resp. rate 12, height _0  (1.727 m), weight 176 lb (79.8 kg).  Physical Exam Physical Exam  Pulmonary/Chest:      Data Reviewed Radiation oncology notes reviewed.. Data arrived shortly after the patient had left the office that her Mammoprint testing showed her to be at low risk for recurrent disease. 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   Assessment    Candidate for accelerated partial breast radiation.    Plan    Prior ultrasound showed a sizable seroma cavity and the right upper outer quadrant of the breast. Reassessment today again shows a reasonable area for balloon placement with close approximation of the cavity to the skin at the most medial aspect. With ultrasound guidance 10 mL of 0.5% Xylocaine with 0.25% Marcaine with 1-200,000 of epinephrine was instilled. The site was prepped with ChloraPrep and draped. An 8 mm incision was made followed by passage of an 8 mm trocar into the biopsy cavity. Approximately 15 mL of odorless serous fluid was obtained. The cavity evaluation device was placed under ultrasound guidance and inflated with 35 mL of normal saline. This showed the closest approximation of the skin at 0.8 cm. This was felt adequate for placement of a treatment balloon. A treatment balloon was prepared and inflated to 60 mL. Symmetrical spherical inflation noted. No leakage from the fluid installation site was noted. The cavity evaluation device was removed and a treatment balloon placed. This was inflated with a mixture of Isovue and saline. A 35 mL volume was completed. No significant residual fluid pockets were  identified.  The patient experienced mild discomfort during balloon inflation which had discontinued by the time the procedure was complete. Bacitracin ointment was applied to the catheter exit site. Bulky dressing applied.  Post procedure wound care was reviewed with the patient by the nurse. She will present for CT imaging on Thursday, November 30.  Office follow-up will take place in 2 weeks.       [name] 01/22/2016, 12:06 PM

## 2016-01-24 ENCOUNTER — Ambulatory Visit: Payer: Medicare Other

## 2016-01-24 ENCOUNTER — Telehealth: Payer: Self-pay | Admitting: *Deleted

## 2016-01-24 NOTE — Telephone Encounter (Signed)
Phone call from [place] at [facility], she states she is not sure if her measurements are going to be adequate for treatment. Dr Baruch Gouty calling Dr Bary Castilla to discuss.

## 2016-01-25 ENCOUNTER — Ambulatory Visit: Payer: Medicare Other

## 2016-01-25 ENCOUNTER — Encounter: Payer: Self-pay | Admitting: General Surgery

## 2016-01-28 ENCOUNTER — Ambulatory Visit: Payer: Medicare Other

## 2016-01-29 ENCOUNTER — Ambulatory Visit: Payer: Medicare Other

## 2016-01-30 ENCOUNTER — Ambulatory Visit: Payer: Medicare Other

## 2016-01-30 ENCOUNTER — Ambulatory Visit
Admission: RE | Admit: 2016-01-30 | Discharge: 2016-01-30 | Disposition: A | Discharge status: Home / Self Care | Payer: Medicare Other | Source: Ambulatory Visit | Attending: Radiation Oncology | Admitting: Radiation Oncology

## 2016-01-30 DIAGNOSIS — Z510 Encounter for antineoplastic radiation therapy: Secondary | ICD-10-CM | POA: Diagnosis not present

## 2016-01-30 DIAGNOSIS — N62 Hypertrophy of breast: Secondary | ICD-10-CM | POA: Diagnosis not present

## 2016-01-30 DIAGNOSIS — J45909 Unspecified asthma, uncomplicated: Secondary | ICD-10-CM | POA: Diagnosis not present

## 2016-01-30 DIAGNOSIS — Z79899 Other long term (current) drug therapy: Secondary | ICD-10-CM | POA: Diagnosis not present

## 2016-01-30 DIAGNOSIS — G40909 Epilepsy, unspecified, not intractable, without status epilepticus: Secondary | ICD-10-CM | POA: Diagnosis not present

## 2016-01-30 DIAGNOSIS — Z87891 Personal history of nicotine dependence: Secondary | ICD-10-CM | POA: Diagnosis not present

## 2016-01-30 DIAGNOSIS — Z8719 Personal history of other diseases of the digestive system: Secondary | ICD-10-CM | POA: Diagnosis not present

## 2016-01-30 DIAGNOSIS — M129 Arthropathy, unspecified: Secondary | ICD-10-CM | POA: Diagnosis not present

## 2016-01-30 DIAGNOSIS — I252 Old myocardial infarction: Secondary | ICD-10-CM | POA: Diagnosis not present

## 2016-01-30 DIAGNOSIS — Z170 Estrogen receptor positive status [ER+]: Secondary | ICD-10-CM | POA: Diagnosis not present

## 2016-01-30 DIAGNOSIS — Z8049 Family history of malignant neoplasm of other genital organs: Secondary | ICD-10-CM | POA: Diagnosis not present

## 2016-01-30 DIAGNOSIS — Z86010 Personal history of colonic polyps: Secondary | ICD-10-CM | POA: Diagnosis not present

## 2016-01-30 DIAGNOSIS — J449 Chronic obstructive pulmonary disease, unspecified: Secondary | ICD-10-CM | POA: Diagnosis not present

## 2016-01-30 DIAGNOSIS — C50411 Malignant neoplasm of upper-outer quadrant of right female breast: Secondary | ICD-10-CM | POA: Diagnosis not present

## 2016-01-31 ENCOUNTER — Telehealth: Payer: Self-pay | Admitting: Internal Medicine

## 2016-01-31 ENCOUNTER — Encounter: Payer: Self-pay | Admitting: Internal Medicine

## 2016-01-31 ENCOUNTER — Ambulatory Visit: Payer: Medicare Other

## 2016-01-31 DIAGNOSIS — Z950 Presence of cardiac pacemaker: Secondary | ICD-10-CM

## 2016-01-31 DIAGNOSIS — I442 Atrioventricular block, complete: Secondary | ICD-10-CM

## 2016-01-31 NOTE — Telephone Encounter (Signed)
I called and spoke with the patient- I advised her we received a request from Dr. Baruch Gouty at the [facility] that she is due to start radiation treatments on 02/06/16. I advised that Dr. Caryl Comes would like an updated chest x-ray on her prior to her starting her treatments. She is agreeable with this. I advised that I will place an order for this to be done at [facility]- she will need to check in at the 1st desk on the right in the medical mall. She may do this on a walk in basis.

## 2016-01-31 NOTE — Progress Notes (Unsigned)
We received a request from [facility] regarding radiation safety related to a previously implanted pacemaker. The request suggested that the pacemaker was on the right side as it was listed as "less than 5 cm "from the right breast. We do not have imaging information as to its location as was placed elsewhere. However, in talking with Dr. Baruch Gouty today he says that he is scout films demonstrate that the pacemaker is in fact on the left side. Hence, device interrogation pre-and post radiation therapy is appropriate we will also continue to monitor her device through [employer] if possible.  We will confirm that by chest x-ray that the pacemaker is in fact on the left side.

## 2016-02-01 ENCOUNTER — Ambulatory Visit
Admission: RE | Admit: 2016-02-01 | Discharge: 2016-02-01 | Disposition: A | Discharge status: Home / Self Care | Payer: Medicare Other | Source: Ambulatory Visit | Attending: Internal Medicine | Admitting: Internal Medicine

## 2016-02-01 DIAGNOSIS — Z950 Presence of cardiac pacemaker: Secondary | ICD-10-CM | POA: Diagnosis not present

## 2016-02-01 DIAGNOSIS — I442 Atrioventricular block, complete: Secondary | ICD-10-CM | POA: Diagnosis not present

## 2016-02-01 DIAGNOSIS — C3411 Malignant neoplasm of upper lobe, right bronchus or lung: Secondary | ICD-10-CM | POA: Diagnosis not present

## 2016-02-05 ENCOUNTER — Ambulatory Visit (INDEPENDENT_AMBULATORY_CARE_PROVIDER_SITE_OTHER): Payer: Medicare Other | Admitting: General Surgery

## 2016-02-05 ENCOUNTER — Encounter: Payer: Self-pay | Admitting: General Surgery

## 2016-02-05 VITALS — BP 116/64 | HR 78 | Resp 12 | Ht 68.0 in | Wt 177.0 lb

## 2016-02-05 DIAGNOSIS — C50411 Malignant neoplasm of upper-outer quadrant of right female breast: Secondary | ICD-10-CM | POA: Diagnosis not present

## 2016-02-05 DIAGNOSIS — N62 Hypertrophy of breast: Secondary | ICD-10-CM | POA: Diagnosis not present

## 2016-02-05 DIAGNOSIS — M129 Arthropathy, unspecified: Secondary | ICD-10-CM | POA: Diagnosis not present

## 2016-02-05 DIAGNOSIS — J45909 Unspecified asthma, uncomplicated: Secondary | ICD-10-CM | POA: Diagnosis not present

## 2016-02-05 DIAGNOSIS — Z170 Estrogen receptor positive status [ER+]: Secondary | ICD-10-CM

## 2016-02-05 DIAGNOSIS — Z510 Encounter for antineoplastic radiation therapy: Secondary | ICD-10-CM | POA: Diagnosis not present

## 2016-02-05 NOTE — Progress Notes (Signed)
Patient ID: Diana Cook, female   DOB: 12/27/1950, 65 y.o.   MRN: [id number]  Chief Complaint  Patient presents with  . Follow-up    HPI Diana Cook is a 65 y.o. female.  here today for follow up right breast lumpectomy 01-02-16. She will need full breast radiation starting 02-12-16. She states the left breast is still tender and the right breast is doing well. Bone density was 11-22-15. She is here today with her husband, Diana Cook.  HPI  Past Medical History:  Diagnosis Date  . Allergic rhinitis   . Arthritis   . Asthma    as a child, mild now  . Cancer [facility])    breast  . Colon polyps    colonoscopy 7/08, tubular adenoma  . Complete heart block ([place]) 8/13   s/p PPM implanted in [facility]  . COPD (chronic obstructive pulmonary disease) ([place])   . Myocardial infarction 2011  . Pacemaker    2011  . Seizure disorder ([place])   . Seizures ([place])    first one was when she was 65 years old   . Small bowel obstruction    1988 and 2002  . Tobacco abuse     Past Surgical History:  Procedure Laterality Date  . ABDOMINAL HYSTERECTOMY    . BREAST BIOPSY Right 11/2005   benign inflammatory changes, mass due to underwire bra  . BREAST BIOPSY Left 01/02/2016   Procedure: BREAST BIOPSY WITH BRACKETING WIRES;  Surgeon: [name], MD;  Location: [facility] ORS;  Service: General;  Laterality: Left;  . BREAST LUMPECTOMY WITH SENTINEL LYMPH NODE BIOPSY Right 01/02/2016   Procedure: BREAST LUMPECTOMY WITH SENTINEL LYMPH NODE BX;  Surgeon: [name], MD;  Location: [facility] ORS;  Service: General;  Laterality: Right;  . CATARACT EXTRACTION Bilateral   . COLONOSCOPY  10/2015   Dr Ardis Hughs  . gyn surgery  1993   hysterectomy- form endometriosis  . LAPAROSCOPY    . PACEMAKER INSERTION  10/24/11   [employer] Advantio dual chamber PPM implanted by Dr Bunnie Philips at [facility] in [place]  . SMALL INTESTINE SURGERY  12/2000   for blockage    Family History   Problem Relation Age of Onset  . Stroke Mother   . Heart disease Mother     MI  . Dementia Mother   . Coronary artery disease Father   . Parkinsonism Father   . Cancer - Cervical Daughter 4  . Colon cancer Neg Hx   . Breast cancer Neg Hx     Social History Social History  Substance Use Topics  . Smoking status: Former Smoker    Packs/day: 0.40    Years: 40.00    Types: Cigarettes    Quit date: 11/11/2010  . Smokeless tobacco: Never Used  . Alcohol use 4.2 oz/week    7 Glasses of wine per week     Comment: wine (pt said it's very occ)    Allergies  Allergen Reactions  . Codeine Nausea And Vomiting    REACTION: nausea  . Morphine And Related Nausea Only    Nausea     Current Outpatient Prescriptions  Medication Sig Dispense Refill  . acetaminophen (TYLENOL) 500 MG tablet Take 500 mg by mouth every 6 (six) hours as needed.    Marland Kitchen albuterol (PROVENTIL HFA;VENTOLIN HFA) 108 (90 Base) MCG/ACT inhaler Inhale 2 puffs into the lungs every 6 (six) hours as needed for wheezing or shortness of breath. 1  Inhaler 11  . Calcium-Vitamin D-Vitamin K 341-962-22 MG-UNT-MCG TABS Take 2 tablets by mouth daily.    . Diphenhydramine-APAP 25-500 MG TABS Take 1 tablet by mouth at bedtime as needed.    . furosemide (LASIX) 20 MG tablet TAKE ONE TABLET BY MOUTH ONCE DAILY AS NEEDED (Patient taking differently: TAKE ONE TABLET BY MOUTH ONCE DAILY AS NEEDED FOR FLUID RETENTION/SWELLING) 30 tablet 3  . traMADol (ULTRAM) 50 MG tablet 50 mg every 6 (six) hours as needed.     . tretinoin (RETIN-A) 0.1 % cream Apply topically at bedtime. As directed for facial wrinkles (Patient taking differently: Apply 1 application topically 3 (three) times a week. ) 20 g 3  . zolpidem (AMBIEN) 10 MG tablet TAKE ONE-HALF TO ONE TABLET BY MOUTH AT BEDTIME AS NEEDED FOR SLEEP (Patient taking differently: Take 5 mg by mouth at bedtime. TAKE ONE-HALF TO ONE TABLET BY MOUTH AT BEDTIME AS NEEDED FOR SLEEP) 30 tablet 3   No  current facility-administered medications for this visit.     Review of Systems Review of Systems  Constitutional: Negative.   Respiratory: Negative.   Cardiovascular: Negative.     Blood pressure 116/64, pulse 78, resp. rate 12, height '5\' 8"'$  (1.727 m), weight 177 lb (80.3 kg).  Physical Exam Physical Exam  Constitutional: She is oriented to person, place, and time. She appears well-developed and well-nourished.  Pulmonary/Chest: Right breast exhibits no inverted nipple, no mass, no nipple discharge, no skin change and no tenderness. Left breast exhibits no inverted nipple, no mass, no nipple discharge, no skin change and no tenderness.    Right breast incision well healed.  Neurological: She is alert and oriented to person, place, and time.  Skin: Skin is warm and dry.  Psychiatric: Her behavior is normal.    Data Reviewed When the patient presented for CT simulation the space in between the skin and the MammoSite balloon was 5 mm necessitating balloon removal.  Assessment    Residual left breast pain likely secondary to mild inflammatory process, improvement with local heat will be expected.  Now candidate for whole breast radiation.      Plan    Will review antiestrogen therapy at follow-up at the end of radiation treatment.    May use heat to the left breast as needed for comfort. Gradually resume activities as tolerated.  Follow up in 6 weeks. Discuss and start anti-hormone therapy at that appointment   This information has been scribed by Karie Fetch RN, BSN,BC.   [name] 02/05/2016, 7:54 PM

## 2016-02-05 NOTE — Patient Instructions (Addendum)
The patient is aware to call back for any questions or concerns. May use heat to the left breast as needed for comfort. Gradually resume activities as tolerated.

## 2016-02-06 ENCOUNTER — Ambulatory Visit (INDEPENDENT_AMBULATORY_CARE_PROVIDER_SITE_OTHER): Payer: Medicare Other

## 2016-02-06 DIAGNOSIS — Z23 Encounter for immunization: Secondary | ICD-10-CM | POA: Diagnosis not present

## 2016-02-07 ENCOUNTER — Telehealth: Payer: Self-pay | Admitting: Internal Medicine

## 2016-02-07 ENCOUNTER — Telehealth: Payer: Self-pay | Admitting: Family Medicine

## 2016-02-07 NOTE — Telephone Encounter (Signed)
I left a message for the patient to call. 

## 2016-02-07 NOTE — Telephone Encounter (Signed)
Pt calling stating she has a pacemaker  She is taking Tylenol  Just wants to make sure if she is allowed to talk aleve  Please advise.

## 2016-02-07 NOTE — Telephone Encounter (Signed)
Pt called because she went to pick up her inhaler that was called in for her and the cost has gone up to $90.  She wants to know if there is something cheaper that she can take.  Can you please call her to discuss.  She said her insurance does not pay for her inhaler.  She did not leave the name of the medication.  She uses the pharmacy at [facility] on [address].

## 2016-02-07 NOTE — Telephone Encounter (Signed)
Please ask her to call her ins co and see what they do cover in the same class- sometimes it is just a brand issue and we can sub something else - I am happy to send in an alternative  Will cc to both [place] and [place] since it is after 5 and I am not in the office today Thanks!

## 2016-02-08 ENCOUNTER — Other Ambulatory Visit: Payer: Self-pay | Admitting: *Deleted

## 2016-02-08 DIAGNOSIS — C50411 Malignant neoplasm of upper-outer quadrant of right female breast: Secondary | ICD-10-CM

## 2016-02-08 DIAGNOSIS — Z170 Estrogen receptor positive status [ER+]: Principal | ICD-10-CM

## 2016-02-08 NOTE — Telephone Encounter (Signed)
Pt will check with insurance and then call us back to let us know what inhaler they do cover

## 2016-02-08 NOTE — Telephone Encounter (Signed)
I left a message for the patient to call. 

## 2016-02-12 ENCOUNTER — Ambulatory Visit
Admission: RE | Admit: 2016-02-12 | Discharge: 2016-02-12 | Disposition: A | Discharge status: Home / Self Care | Payer: Medicare Other | Source: Ambulatory Visit | Attending: Radiation Oncology | Admitting: Radiation Oncology

## 2016-02-12 DIAGNOSIS — J45909 Unspecified asthma, uncomplicated: Secondary | ICD-10-CM | POA: Diagnosis not present

## 2016-02-12 DIAGNOSIS — M129 Arthropathy, unspecified: Secondary | ICD-10-CM | POA: Diagnosis not present

## 2016-02-12 DIAGNOSIS — C50411 Malignant neoplasm of upper-outer quadrant of right female breast: Secondary | ICD-10-CM | POA: Diagnosis not present

## 2016-02-12 DIAGNOSIS — Z510 Encounter for antineoplastic radiation therapy: Secondary | ICD-10-CM | POA: Diagnosis not present

## 2016-02-12 DIAGNOSIS — N62 Hypertrophy of breast: Secondary | ICD-10-CM | POA: Diagnosis not present

## 2016-02-12 DIAGNOSIS — Z170 Estrogen receptor positive status [ER+]: Secondary | ICD-10-CM | POA: Diagnosis not present

## 2016-02-12 NOTE — Telephone Encounter (Signed)
Patient returning call.  Please try cell  °

## 2016-02-12 NOTE — Telephone Encounter (Signed)
I called and spoke with the patient and advised her that intermittent use of aleve would be ok to use.  She verbalizes understanding.

## 2016-02-13 ENCOUNTER — Ambulatory Visit
Admission: RE | Admit: 2016-02-13 | Discharge: 2016-02-13 | Disposition: A | Discharge status: Home / Self Care | Payer: Medicare Other | Source: Ambulatory Visit | Attending: Radiation Oncology | Admitting: Radiation Oncology

## 2016-02-13 DIAGNOSIS — M129 Arthropathy, unspecified: Secondary | ICD-10-CM | POA: Diagnosis not present

## 2016-02-13 DIAGNOSIS — Z170 Estrogen receptor positive status [ER+]: Secondary | ICD-10-CM | POA: Diagnosis not present

## 2016-02-13 DIAGNOSIS — N62 Hypertrophy of breast: Secondary | ICD-10-CM | POA: Diagnosis not present

## 2016-02-13 DIAGNOSIS — C50411 Malignant neoplasm of upper-outer quadrant of right female breast: Secondary | ICD-10-CM | POA: Diagnosis not present

## 2016-02-13 DIAGNOSIS — J45909 Unspecified asthma, uncomplicated: Secondary | ICD-10-CM | POA: Diagnosis not present

## 2016-02-13 DIAGNOSIS — Z510 Encounter for antineoplastic radiation therapy: Secondary | ICD-10-CM | POA: Diagnosis not present

## 2016-02-14 ENCOUNTER — Ambulatory Visit
Admission: RE | Admit: 2016-02-14 | Discharge: 2016-02-14 | Disposition: A | Discharge status: Home / Self Care | Payer: Medicare Other | Source: Ambulatory Visit | Attending: Radiation Oncology | Admitting: Radiation Oncology

## 2016-02-14 DIAGNOSIS — Z170 Estrogen receptor positive status [ER+]: Secondary | ICD-10-CM | POA: Diagnosis not present

## 2016-02-14 DIAGNOSIS — C50411 Malignant neoplasm of upper-outer quadrant of right female breast: Secondary | ICD-10-CM | POA: Diagnosis not present

## 2016-02-14 DIAGNOSIS — N62 Hypertrophy of breast: Secondary | ICD-10-CM | POA: Diagnosis not present

## 2016-02-14 DIAGNOSIS — M129 Arthropathy, unspecified: Secondary | ICD-10-CM | POA: Diagnosis not present

## 2016-02-14 DIAGNOSIS — Z510 Encounter for antineoplastic radiation therapy: Secondary | ICD-10-CM | POA: Diagnosis not present

## 2016-02-14 DIAGNOSIS — J45909 Unspecified asthma, uncomplicated: Secondary | ICD-10-CM | POA: Diagnosis not present

## 2016-02-15 ENCOUNTER — Ambulatory Visit
Admission: RE | Admit: 2016-02-15 | Discharge: 2016-02-15 | Disposition: A | Discharge status: Home / Self Care | Payer: Medicare Other | Source: Ambulatory Visit | Attending: Radiation Oncology | Admitting: Radiation Oncology

## 2016-02-15 DIAGNOSIS — M129 Arthropathy, unspecified: Secondary | ICD-10-CM | POA: Diagnosis not present

## 2016-02-15 DIAGNOSIS — C50411 Malignant neoplasm of upper-outer quadrant of right female breast: Secondary | ICD-10-CM | POA: Diagnosis not present

## 2016-02-15 DIAGNOSIS — J45909 Unspecified asthma, uncomplicated: Secondary | ICD-10-CM | POA: Diagnosis not present

## 2016-02-15 DIAGNOSIS — Z170 Estrogen receptor positive status [ER+]: Secondary | ICD-10-CM | POA: Diagnosis not present

## 2016-02-15 DIAGNOSIS — Z510 Encounter for antineoplastic radiation therapy: Secondary | ICD-10-CM | POA: Diagnosis not present

## 2016-02-15 DIAGNOSIS — N62 Hypertrophy of breast: Secondary | ICD-10-CM | POA: Diagnosis not present

## 2016-02-19 ENCOUNTER — Ambulatory Visit
Admission: RE | Admit: 2016-02-19 | Discharge: 2016-02-19 | Disposition: A | Discharge status: Home / Self Care | Payer: Medicare Other | Source: Ambulatory Visit | Attending: Radiation Oncology | Admitting: Radiation Oncology

## 2016-02-19 DIAGNOSIS — N62 Hypertrophy of breast: Secondary | ICD-10-CM | POA: Diagnosis not present

## 2016-02-19 DIAGNOSIS — M129 Arthropathy, unspecified: Secondary | ICD-10-CM | POA: Diagnosis not present

## 2016-02-19 DIAGNOSIS — Z170 Estrogen receptor positive status [ER+]: Secondary | ICD-10-CM | POA: Diagnosis not present

## 2016-02-19 DIAGNOSIS — J45909 Unspecified asthma, uncomplicated: Secondary | ICD-10-CM | POA: Diagnosis not present

## 2016-02-19 DIAGNOSIS — Z510 Encounter for antineoplastic radiation therapy: Secondary | ICD-10-CM | POA: Diagnosis not present

## 2016-02-19 DIAGNOSIS — C50411 Malignant neoplasm of upper-outer quadrant of right female breast: Secondary | ICD-10-CM | POA: Diagnosis not present

## 2016-02-20 ENCOUNTER — Ambulatory Visit
Admission: RE | Admit: 2016-02-20 | Discharge: 2016-02-20 | Disposition: A | Discharge status: Home / Self Care | Payer: Medicare Other | Source: Ambulatory Visit | Attending: Radiation Oncology | Admitting: Radiation Oncology

## 2016-02-20 DIAGNOSIS — M129 Arthropathy, unspecified: Secondary | ICD-10-CM | POA: Diagnosis not present

## 2016-02-20 DIAGNOSIS — C50411 Malignant neoplasm of upper-outer quadrant of right female breast: Secondary | ICD-10-CM | POA: Diagnosis not present

## 2016-02-20 DIAGNOSIS — Z170 Estrogen receptor positive status [ER+]: Secondary | ICD-10-CM | POA: Diagnosis not present

## 2016-02-20 DIAGNOSIS — N62 Hypertrophy of breast: Secondary | ICD-10-CM | POA: Diagnosis not present

## 2016-02-20 DIAGNOSIS — Z510 Encounter for antineoplastic radiation therapy: Secondary | ICD-10-CM | POA: Diagnosis not present

## 2016-02-20 DIAGNOSIS — J45909 Unspecified asthma, uncomplicated: Secondary | ICD-10-CM | POA: Diagnosis not present

## 2016-02-21 ENCOUNTER — Ambulatory Visit
Admission: RE | Admit: 2016-02-21 | Discharge: 2016-02-21 | Disposition: A | Discharge status: Home / Self Care | Payer: Medicare Other | Source: Ambulatory Visit | Attending: Radiation Oncology | Admitting: Radiation Oncology

## 2016-02-21 DIAGNOSIS — J45909 Unspecified asthma, uncomplicated: Secondary | ICD-10-CM | POA: Diagnosis not present

## 2016-02-21 DIAGNOSIS — M129 Arthropathy, unspecified: Secondary | ICD-10-CM | POA: Diagnosis not present

## 2016-02-21 DIAGNOSIS — Z510 Encounter for antineoplastic radiation therapy: Secondary | ICD-10-CM | POA: Diagnosis not present

## 2016-02-21 DIAGNOSIS — N62 Hypertrophy of breast: Secondary | ICD-10-CM | POA: Diagnosis not present

## 2016-02-21 DIAGNOSIS — Z170 Estrogen receptor positive status [ER+]: Secondary | ICD-10-CM | POA: Diagnosis not present

## 2016-02-21 DIAGNOSIS — C50411 Malignant neoplasm of upper-outer quadrant of right female breast: Secondary | ICD-10-CM | POA: Diagnosis not present

## 2016-02-22 ENCOUNTER — Ambulatory Visit
Admission: RE | Admit: 2016-02-22 | Discharge: 2016-02-22 | Disposition: A | Discharge status: Home / Self Care | Payer: Medicare Other | Source: Ambulatory Visit | Attending: Radiation Oncology | Admitting: Radiation Oncology

## 2016-02-22 DIAGNOSIS — N62 Hypertrophy of breast: Secondary | ICD-10-CM | POA: Diagnosis not present

## 2016-02-22 DIAGNOSIS — J45909 Unspecified asthma, uncomplicated: Secondary | ICD-10-CM | POA: Diagnosis not present

## 2016-02-22 DIAGNOSIS — Z510 Encounter for antineoplastic radiation therapy: Secondary | ICD-10-CM | POA: Diagnosis not present

## 2016-02-22 DIAGNOSIS — C50411 Malignant neoplasm of upper-outer quadrant of right female breast: Secondary | ICD-10-CM | POA: Diagnosis not present

## 2016-02-22 DIAGNOSIS — Z170 Estrogen receptor positive status [ER+]: Secondary | ICD-10-CM | POA: Diagnosis not present

## 2016-02-22 DIAGNOSIS — M129 Arthropathy, unspecified: Secondary | ICD-10-CM | POA: Diagnosis not present

## 2016-02-26 ENCOUNTER — Ambulatory Visit
Admission: RE | Admit: 2016-02-26 | Discharge: 2016-02-26 | Disposition: A | Discharge status: Home / Self Care | Payer: Medicare Other | Source: Ambulatory Visit | Attending: Radiation Oncology | Admitting: Radiation Oncology

## 2016-02-26 DIAGNOSIS — C50411 Malignant neoplasm of upper-outer quadrant of right female breast: Secondary | ICD-10-CM | POA: Diagnosis not present

## 2016-02-26 DIAGNOSIS — Z86010 Personal history of colonic polyps: Secondary | ICD-10-CM | POA: Diagnosis not present

## 2016-02-26 DIAGNOSIS — J45909 Unspecified asthma, uncomplicated: Secondary | ICD-10-CM | POA: Diagnosis not present

## 2016-02-26 DIAGNOSIS — M129 Arthropathy, unspecified: Secondary | ICD-10-CM | POA: Diagnosis not present

## 2016-02-26 DIAGNOSIS — Z510 Encounter for antineoplastic radiation therapy: Secondary | ICD-10-CM | POA: Diagnosis not present

## 2016-02-26 DIAGNOSIS — Z79899 Other long term (current) drug therapy: Secondary | ICD-10-CM | POA: Diagnosis not present

## 2016-02-26 DIAGNOSIS — Z170 Estrogen receptor positive status [ER+]: Secondary | ICD-10-CM | POA: Diagnosis not present

## 2016-02-26 DIAGNOSIS — N62 Hypertrophy of breast: Secondary | ICD-10-CM | POA: Diagnosis not present

## 2016-02-26 DIAGNOSIS — Z8049 Family history of malignant neoplasm of other genital organs: Secondary | ICD-10-CM | POA: Diagnosis not present

## 2016-02-26 DIAGNOSIS — I252 Old myocardial infarction: Secondary | ICD-10-CM | POA: Diagnosis not present

## 2016-02-26 DIAGNOSIS — G40909 Epilepsy, unspecified, not intractable, without status epilepticus: Secondary | ICD-10-CM | POA: Diagnosis not present

## 2016-02-26 DIAGNOSIS — Z8719 Personal history of other diseases of the digestive system: Secondary | ICD-10-CM | POA: Diagnosis not present

## 2016-02-26 DIAGNOSIS — Z87891 Personal history of nicotine dependence: Secondary | ICD-10-CM | POA: Diagnosis not present

## 2016-02-26 DIAGNOSIS — J449 Chronic obstructive pulmonary disease, unspecified: Secondary | ICD-10-CM | POA: Diagnosis not present

## 2016-02-27 ENCOUNTER — Ambulatory Visit
Admission: RE | Admit: 2016-02-27 | Discharge: 2016-02-27 | Disposition: A | Discharge status: Home / Self Care | Payer: Medicare Other | Source: Ambulatory Visit | Attending: Radiation Oncology | Admitting: Radiation Oncology

## 2016-02-27 ENCOUNTER — Inpatient Hospital Stay: Payer: Medicare Other | Attending: Radiation Oncology

## 2016-02-27 DIAGNOSIS — J45909 Unspecified asthma, uncomplicated: Secondary | ICD-10-CM | POA: Diagnosis not present

## 2016-02-27 DIAGNOSIS — C50911 Malignant neoplasm of unspecified site of right female breast: Secondary | ICD-10-CM | POA: Insufficient documentation

## 2016-02-27 DIAGNOSIS — C50411 Malignant neoplasm of upper-outer quadrant of right female breast: Secondary | ICD-10-CM | POA: Diagnosis not present

## 2016-02-27 DIAGNOSIS — Z510 Encounter for antineoplastic radiation therapy: Secondary | ICD-10-CM | POA: Diagnosis not present

## 2016-02-27 DIAGNOSIS — Z170 Estrogen receptor positive status [ER+]: Secondary | ICD-10-CM

## 2016-02-27 DIAGNOSIS — N62 Hypertrophy of breast: Secondary | ICD-10-CM | POA: Diagnosis not present

## 2016-02-27 DIAGNOSIS — M129 Arthropathy, unspecified: Secondary | ICD-10-CM | POA: Diagnosis not present

## 2016-02-27 LAB — CBC
HEMATOCRIT: 38 % (ref 35.0–47.0)
Hemoglobin: 12.8 g/dL (ref 12.0–16.0)
MCH: 29.6 pg (ref 26.0–34.0)
MCHC: 33.8 g/dL (ref 32.0–36.0)
MCV: 87.8 fL (ref 80.0–100.0)
Platelets: 277 10*3/uL (ref 150–440)
RBC: 4.33 MIL/uL (ref 3.80–5.20)
RDW: 13.1 % (ref 11.5–14.5)
WBC: 6.5 10*3/uL (ref 3.6–11.0)

## 2016-02-28 ENCOUNTER — Ambulatory Visit
Admission: RE | Admit: 2016-02-28 | Discharge: 2016-02-28 | Disposition: A | Discharge status: Home / Self Care | Payer: Medicare Other | Source: Ambulatory Visit | Attending: Radiation Oncology | Admitting: Radiation Oncology

## 2016-02-28 DIAGNOSIS — N62 Hypertrophy of breast: Secondary | ICD-10-CM | POA: Diagnosis not present

## 2016-02-28 DIAGNOSIS — Z170 Estrogen receptor positive status [ER+]: Secondary | ICD-10-CM | POA: Diagnosis not present

## 2016-02-28 DIAGNOSIS — J45909 Unspecified asthma, uncomplicated: Secondary | ICD-10-CM | POA: Diagnosis not present

## 2016-02-28 DIAGNOSIS — C50411 Malignant neoplasm of upper-outer quadrant of right female breast: Secondary | ICD-10-CM | POA: Diagnosis not present

## 2016-02-28 DIAGNOSIS — Z510 Encounter for antineoplastic radiation therapy: Secondary | ICD-10-CM | POA: Diagnosis not present

## 2016-02-28 DIAGNOSIS — M129 Arthropathy, unspecified: Secondary | ICD-10-CM | POA: Diagnosis not present

## 2016-02-29 ENCOUNTER — Ambulatory Visit
Admission: RE | Admit: 2016-02-29 | Discharge: 2016-02-29 | Disposition: A | Discharge status: Home / Self Care | Payer: Medicare Other | Source: Ambulatory Visit | Attending: Radiation Oncology | Admitting: Radiation Oncology

## 2016-02-29 DIAGNOSIS — M129 Arthropathy, unspecified: Secondary | ICD-10-CM | POA: Diagnosis not present

## 2016-02-29 DIAGNOSIS — J45909 Unspecified asthma, uncomplicated: Secondary | ICD-10-CM | POA: Diagnosis not present

## 2016-02-29 DIAGNOSIS — N62 Hypertrophy of breast: Secondary | ICD-10-CM | POA: Diagnosis not present

## 2016-02-29 DIAGNOSIS — C50411 Malignant neoplasm of upper-outer quadrant of right female breast: Secondary | ICD-10-CM | POA: Diagnosis not present

## 2016-02-29 DIAGNOSIS — Z510 Encounter for antineoplastic radiation therapy: Secondary | ICD-10-CM | POA: Diagnosis not present

## 2016-02-29 DIAGNOSIS — Z170 Estrogen receptor positive status [ER+]: Secondary | ICD-10-CM | POA: Diagnosis not present

## 2016-03-03 ENCOUNTER — Ambulatory Visit: Payer: Medicare Other | Admitting: Radiation Oncology

## 2016-03-03 ENCOUNTER — Ambulatory Visit
Admission: RE | Admit: 2016-03-03 | Discharge: 2016-03-03 | Disposition: A | Discharge status: Home / Self Care | Payer: Medicare Other | Source: Ambulatory Visit | Attending: Radiation Oncology | Admitting: Radiation Oncology

## 2016-03-03 DIAGNOSIS — J45909 Unspecified asthma, uncomplicated: Secondary | ICD-10-CM | POA: Diagnosis not present

## 2016-03-03 DIAGNOSIS — M129 Arthropathy, unspecified: Secondary | ICD-10-CM | POA: Diagnosis not present

## 2016-03-03 DIAGNOSIS — Z510 Encounter for antineoplastic radiation therapy: Secondary | ICD-10-CM | POA: Diagnosis not present

## 2016-03-03 DIAGNOSIS — N62 Hypertrophy of breast: Secondary | ICD-10-CM | POA: Diagnosis not present

## 2016-03-03 DIAGNOSIS — C50411 Malignant neoplasm of upper-outer quadrant of right female breast: Secondary | ICD-10-CM | POA: Diagnosis not present

## 2016-03-03 DIAGNOSIS — Z170 Estrogen receptor positive status [ER+]: Secondary | ICD-10-CM | POA: Diagnosis not present

## 2016-03-04 ENCOUNTER — Ambulatory Visit
Admission: RE | Admit: 2016-03-04 | Discharge: 2016-03-04 | Disposition: A | Discharge status: Home / Self Care | Payer: Medicare Other | Source: Ambulatory Visit | Attending: Radiation Oncology | Admitting: Radiation Oncology

## 2016-03-04 DIAGNOSIS — N62 Hypertrophy of breast: Secondary | ICD-10-CM | POA: Diagnosis not present

## 2016-03-04 DIAGNOSIS — Z510 Encounter for antineoplastic radiation therapy: Secondary | ICD-10-CM | POA: Diagnosis not present

## 2016-03-04 DIAGNOSIS — Z170 Estrogen receptor positive status [ER+]: Secondary | ICD-10-CM | POA: Diagnosis not present

## 2016-03-04 DIAGNOSIS — M129 Arthropathy, unspecified: Secondary | ICD-10-CM | POA: Diagnosis not present

## 2016-03-04 DIAGNOSIS — C50411 Malignant neoplasm of upper-outer quadrant of right female breast: Secondary | ICD-10-CM | POA: Diagnosis not present

## 2016-03-04 DIAGNOSIS — J45909 Unspecified asthma, uncomplicated: Secondary | ICD-10-CM | POA: Diagnosis not present

## 2016-03-05 ENCOUNTER — Ambulatory Visit
Admission: RE | Admit: 2016-03-05 | Discharge: 2016-03-05 | Disposition: A | Discharge status: Home / Self Care | Payer: Medicare Other | Source: Ambulatory Visit | Attending: Radiation Oncology | Admitting: Radiation Oncology

## 2016-03-05 DIAGNOSIS — Z170 Estrogen receptor positive status [ER+]: Secondary | ICD-10-CM | POA: Diagnosis not present

## 2016-03-05 DIAGNOSIS — J45909 Unspecified asthma, uncomplicated: Secondary | ICD-10-CM | POA: Diagnosis not present

## 2016-03-05 DIAGNOSIS — Z510 Encounter for antineoplastic radiation therapy: Secondary | ICD-10-CM | POA: Diagnosis not present

## 2016-03-05 DIAGNOSIS — C50411 Malignant neoplasm of upper-outer quadrant of right female breast: Secondary | ICD-10-CM | POA: Diagnosis not present

## 2016-03-05 DIAGNOSIS — M129 Arthropathy, unspecified: Secondary | ICD-10-CM | POA: Diagnosis not present

## 2016-03-05 DIAGNOSIS — N62 Hypertrophy of breast: Secondary | ICD-10-CM | POA: Diagnosis not present

## 2016-03-06 ENCOUNTER — Ambulatory Visit
Admission: RE | Admit: 2016-03-06 | Discharge: 2016-03-06 | Disposition: A | Discharge status: Home / Self Care | Payer: Medicare Other | Source: Ambulatory Visit | Attending: Radiation Oncology | Admitting: Radiation Oncology

## 2016-03-06 DIAGNOSIS — C50411 Malignant neoplasm of upper-outer quadrant of right female breast: Secondary | ICD-10-CM | POA: Diagnosis not present

## 2016-03-06 DIAGNOSIS — Z510 Encounter for antineoplastic radiation therapy: Secondary | ICD-10-CM | POA: Diagnosis not present

## 2016-03-06 DIAGNOSIS — J45909 Unspecified asthma, uncomplicated: Secondary | ICD-10-CM | POA: Diagnosis not present

## 2016-03-06 DIAGNOSIS — M129 Arthropathy, unspecified: Secondary | ICD-10-CM | POA: Diagnosis not present

## 2016-03-06 DIAGNOSIS — Z170 Estrogen receptor positive status [ER+]: Secondary | ICD-10-CM | POA: Diagnosis not present

## 2016-03-06 DIAGNOSIS — N62 Hypertrophy of breast: Secondary | ICD-10-CM | POA: Diagnosis not present

## 2016-03-07 ENCOUNTER — Ambulatory Visit
Admission: RE | Admit: 2016-03-07 | Discharge: 2016-03-07 | Disposition: A | Discharge status: Home / Self Care | Payer: Medicare Other | Source: Ambulatory Visit | Attending: Radiation Oncology | Admitting: Radiation Oncology

## 2016-03-07 DIAGNOSIS — C50411 Malignant neoplasm of upper-outer quadrant of right female breast: Secondary | ICD-10-CM | POA: Diagnosis not present

## 2016-03-07 DIAGNOSIS — Z170 Estrogen receptor positive status [ER+]: Secondary | ICD-10-CM | POA: Diagnosis not present

## 2016-03-07 DIAGNOSIS — J45909 Unspecified asthma, uncomplicated: Secondary | ICD-10-CM | POA: Diagnosis not present

## 2016-03-07 DIAGNOSIS — N62 Hypertrophy of breast: Secondary | ICD-10-CM | POA: Diagnosis not present

## 2016-03-07 DIAGNOSIS — M129 Arthropathy, unspecified: Secondary | ICD-10-CM | POA: Diagnosis not present

## 2016-03-07 DIAGNOSIS — Z510 Encounter for antineoplastic radiation therapy: Secondary | ICD-10-CM | POA: Diagnosis not present

## 2016-03-10 ENCOUNTER — Ambulatory Visit
Admission: RE | Admit: 2016-03-10 | Discharge: 2016-03-10 | Disposition: A | Discharge status: Home / Self Care | Payer: Medicare Other | Source: Ambulatory Visit | Attending: Radiation Oncology | Admitting: Radiation Oncology

## 2016-03-10 DIAGNOSIS — J45909 Unspecified asthma, uncomplicated: Secondary | ICD-10-CM | POA: Diagnosis not present

## 2016-03-10 DIAGNOSIS — Z510 Encounter for antineoplastic radiation therapy: Secondary | ICD-10-CM | POA: Diagnosis not present

## 2016-03-10 DIAGNOSIS — C50411 Malignant neoplasm of upper-outer quadrant of right female breast: Secondary | ICD-10-CM | POA: Diagnosis not present

## 2016-03-10 DIAGNOSIS — N62 Hypertrophy of breast: Secondary | ICD-10-CM | POA: Diagnosis not present

## 2016-03-10 DIAGNOSIS — M129 Arthropathy, unspecified: Secondary | ICD-10-CM | POA: Diagnosis not present

## 2016-03-10 DIAGNOSIS — Z170 Estrogen receptor positive status [ER+]: Secondary | ICD-10-CM | POA: Diagnosis not present

## 2016-03-11 ENCOUNTER — Ambulatory Visit (INDEPENDENT_AMBULATORY_CARE_PROVIDER_SITE_OTHER): Payer: Medicare Other | Admitting: *Deleted

## 2016-03-11 ENCOUNTER — Ambulatory Visit
Admission: RE | Admit: 2016-03-11 | Discharge: 2016-03-11 | Disposition: A | Discharge status: Home / Self Care | Payer: Medicare Other | Source: Ambulatory Visit | Attending: Radiation Oncology | Admitting: Radiation Oncology

## 2016-03-11 ENCOUNTER — Telehealth: Payer: Self-pay | Admitting: Cardiology

## 2016-03-11 DIAGNOSIS — I442 Atrioventricular block, complete: Secondary | ICD-10-CM

## 2016-03-11 DIAGNOSIS — Z510 Encounter for antineoplastic radiation therapy: Secondary | ICD-10-CM | POA: Diagnosis not present

## 2016-03-11 DIAGNOSIS — M129 Arthropathy, unspecified: Secondary | ICD-10-CM | POA: Diagnosis not present

## 2016-03-11 DIAGNOSIS — J45909 Unspecified asthma, uncomplicated: Secondary | ICD-10-CM | POA: Diagnosis not present

## 2016-03-11 DIAGNOSIS — C50411 Malignant neoplasm of upper-outer quadrant of right female breast: Secondary | ICD-10-CM | POA: Diagnosis not present

## 2016-03-11 DIAGNOSIS — Z170 Estrogen receptor positive status [ER+]: Secondary | ICD-10-CM | POA: Diagnosis not present

## 2016-03-11 DIAGNOSIS — N62 Hypertrophy of breast: Secondary | ICD-10-CM | POA: Diagnosis not present

## 2016-03-11 NOTE — Progress Notes (Signed)
Remote pacemaker transmission.   

## 2016-03-11 NOTE — Telephone Encounter (Signed)
Spoke with pt and reminded pt of remote transmission that is due today. Pt verbalized understanding.   

## 2016-03-12 ENCOUNTER — Ambulatory Visit
Admission: RE | Admit: 2016-03-12 | Discharge: 2016-03-12 | Disposition: A | Discharge status: Home / Self Care | Payer: Medicare Other | Source: Ambulatory Visit | Attending: Radiation Oncology | Admitting: Radiation Oncology

## 2016-03-12 ENCOUNTER — Inpatient Hospital Stay: Payer: Medicare Other

## 2016-03-12 DIAGNOSIS — C50911 Malignant neoplasm of unspecified site of right female breast: Secondary | ICD-10-CM | POA: Diagnosis not present

## 2016-03-12 DIAGNOSIS — Z170 Estrogen receptor positive status [ER+]: Secondary | ICD-10-CM | POA: Diagnosis not present

## 2016-03-12 DIAGNOSIS — C50411 Malignant neoplasm of upper-outer quadrant of right female breast: Secondary | ICD-10-CM

## 2016-03-12 DIAGNOSIS — J45909 Unspecified asthma, uncomplicated: Secondary | ICD-10-CM | POA: Diagnosis not present

## 2016-03-12 DIAGNOSIS — M129 Arthropathy, unspecified: Secondary | ICD-10-CM | POA: Diagnosis not present

## 2016-03-12 DIAGNOSIS — Z510 Encounter for antineoplastic radiation therapy: Secondary | ICD-10-CM | POA: Diagnosis not present

## 2016-03-12 DIAGNOSIS — N62 Hypertrophy of breast: Secondary | ICD-10-CM | POA: Diagnosis not present

## 2016-03-12 LAB — CBC
HEMATOCRIT: 38.8 % (ref 35.0–47.0)
Hemoglobin: 13.2 g/dL (ref 12.0–16.0)
MCH: 29.9 pg (ref 26.0–34.0)
MCHC: 33.9 g/dL (ref 32.0–36.0)
MCV: 88.3 fL (ref 80.0–100.0)
Platelets: 274 10*3/uL (ref 150–440)
RBC: 4.4 MIL/uL (ref 3.80–5.20)
RDW: 12.9 % (ref 11.5–14.5)
WBC: 7.2 10*3/uL (ref 3.6–11.0)

## 2016-03-13 ENCOUNTER — Ambulatory Visit: Payer: Medicare Other

## 2016-03-14 ENCOUNTER — Ambulatory Visit
Admission: RE | Admit: 2016-03-14 | Discharge: 2016-03-14 | Disposition: A | Discharge status: Home / Self Care | Payer: Medicare Other | Source: Ambulatory Visit | Attending: Radiation Oncology | Admitting: Radiation Oncology

## 2016-03-14 DIAGNOSIS — N62 Hypertrophy of breast: Secondary | ICD-10-CM | POA: Diagnosis not present

## 2016-03-14 DIAGNOSIS — C50411 Malignant neoplasm of upper-outer quadrant of right female breast: Secondary | ICD-10-CM | POA: Diagnosis not present

## 2016-03-14 DIAGNOSIS — Z510 Encounter for antineoplastic radiation therapy: Secondary | ICD-10-CM | POA: Diagnosis not present

## 2016-03-14 DIAGNOSIS — J45909 Unspecified asthma, uncomplicated: Secondary | ICD-10-CM | POA: Diagnosis not present

## 2016-03-14 DIAGNOSIS — Z170 Estrogen receptor positive status [ER+]: Secondary | ICD-10-CM | POA: Diagnosis not present

## 2016-03-14 DIAGNOSIS — M129 Arthropathy, unspecified: Secondary | ICD-10-CM | POA: Diagnosis not present

## 2016-03-17 ENCOUNTER — Ambulatory Visit
Admission: RE | Admit: 2016-03-17 | Discharge: 2016-03-17 | Disposition: A | Discharge status: Home / Self Care | Payer: Medicare Other | Source: Ambulatory Visit | Attending: Radiation Oncology | Admitting: Radiation Oncology

## 2016-03-17 DIAGNOSIS — N62 Hypertrophy of breast: Secondary | ICD-10-CM | POA: Diagnosis not present

## 2016-03-17 DIAGNOSIS — Z170 Estrogen receptor positive status [ER+]: Secondary | ICD-10-CM | POA: Diagnosis not present

## 2016-03-17 DIAGNOSIS — M129 Arthropathy, unspecified: Secondary | ICD-10-CM | POA: Diagnosis not present

## 2016-03-17 DIAGNOSIS — Z510 Encounter for antineoplastic radiation therapy: Secondary | ICD-10-CM | POA: Diagnosis not present

## 2016-03-17 DIAGNOSIS — J45909 Unspecified asthma, uncomplicated: Secondary | ICD-10-CM | POA: Diagnosis not present

## 2016-03-17 DIAGNOSIS — C50411 Malignant neoplasm of upper-outer quadrant of right female breast: Secondary | ICD-10-CM | POA: Diagnosis not present

## 2016-03-18 ENCOUNTER — Ambulatory Visit
Admission: RE | Admit: 2016-03-18 | Discharge: 2016-03-18 | Disposition: A | Discharge status: Home / Self Care | Payer: Medicare Other | Source: Ambulatory Visit | Attending: Radiation Oncology | Admitting: Radiation Oncology

## 2016-03-18 ENCOUNTER — Telehealth: Payer: Self-pay

## 2016-03-18 DIAGNOSIS — M129 Arthropathy, unspecified: Secondary | ICD-10-CM | POA: Diagnosis not present

## 2016-03-18 DIAGNOSIS — C50411 Malignant neoplasm of upper-outer quadrant of right female breast: Secondary | ICD-10-CM | POA: Diagnosis not present

## 2016-03-18 DIAGNOSIS — Z510 Encounter for antineoplastic radiation therapy: Secondary | ICD-10-CM | POA: Diagnosis not present

## 2016-03-18 DIAGNOSIS — Z170 Estrogen receptor positive status [ER+]: Secondary | ICD-10-CM | POA: Diagnosis not present

## 2016-03-18 DIAGNOSIS — N62 Hypertrophy of breast: Secondary | ICD-10-CM | POA: Diagnosis not present

## 2016-03-18 DIAGNOSIS — J45909 Unspecified asthma, uncomplicated: Secondary | ICD-10-CM | POA: Diagnosis not present

## 2016-03-18 MED ORDER — ALPRAZOLAM 0.5 MG PO TABS: 0.5000 mg | ORAL_TABLET | Freq: Two times a day (BID) | ORAL | 0 refills | Status: DC | PRN

## 2016-03-18 NOTE — Telephone Encounter (Signed)
Please call in xanax - use with caution and do not drive with it  So sorry to hear  Let us know if there is anything else we can do

## 2016-03-18 NOTE — Telephone Encounter (Signed)
Rx called in as prescribed and pt notified Rx sent and advise of Dr. Marliss Coots comments

## 2016-03-18 NOTE — Telephone Encounter (Signed)
Pt left v/m; pt's daughter has passed away and request med for anxiety for the next few days to [address].Please advise.

## 2016-03-19 ENCOUNTER — Ambulatory Visit: Payer: Medicare Other | Admitting: General Surgery

## 2016-03-19 ENCOUNTER — Encounter: Payer: Self-pay | Admitting: Cardiology

## 2016-03-19 ENCOUNTER — Ambulatory Visit
Admission: RE | Admit: 2016-03-19 | Discharge: 2016-03-19 | Disposition: A | Discharge status: Home / Self Care | Payer: Medicare Other | Source: Ambulatory Visit | Attending: Radiation Oncology | Admitting: Radiation Oncology

## 2016-03-19 ENCOUNTER — Ambulatory Visit: Payer: Medicare Other

## 2016-03-19 DIAGNOSIS — M129 Arthropathy, unspecified: Secondary | ICD-10-CM | POA: Diagnosis not present

## 2016-03-19 DIAGNOSIS — N62 Hypertrophy of breast: Secondary | ICD-10-CM | POA: Diagnosis not present

## 2016-03-19 DIAGNOSIS — J45909 Unspecified asthma, uncomplicated: Secondary | ICD-10-CM | POA: Diagnosis not present

## 2016-03-19 DIAGNOSIS — Z510 Encounter for antineoplastic radiation therapy: Secondary | ICD-10-CM | POA: Diagnosis not present

## 2016-03-19 DIAGNOSIS — Z170 Estrogen receptor positive status [ER+]: Secondary | ICD-10-CM | POA: Diagnosis not present

## 2016-03-19 DIAGNOSIS — C50411 Malignant neoplasm of upper-outer quadrant of right female breast: Secondary | ICD-10-CM | POA: Diagnosis not present

## 2016-03-19 LAB — CUP PACEART REMOTE DEVICE CHECK
Brady Statistic RA Percent Paced: 12 %
Brady Statistic RV Percent Paced: 100 %
Date Time Interrogation Session: 20180116170700
Implantable Lead Implant Date: 20130830
Implantable Lead Location: 753860
Implantable Lead Serial Number: 473325
Lead Channel Impedance Value: 485 Ohm
Lead Channel Pacing Threshold Amplitude: 0.8 V
Lead Channel Pacing Threshold Pulse Width: 0.5 ms
Lead Channel Setting Pacing Amplitude: 2 V
Lead Channel Setting Pacing Amplitude: 2.4 V
Lead Channel Setting Pacing Pulse Width: 0.5 ms
MDC IDC LEAD IMPLANT DT: 20130830
MDC IDC LEAD LOCATION: 753859
MDC IDC LEAD SERIAL: 523784
MDC IDC MSMT BATTERY REMAINING LONGEVITY: 42 mo
MDC IDC MSMT BATTERY REMAINING PERCENTAGE: 63 %
MDC IDC MSMT LEADCHNL RV IMPEDANCE VALUE: 454 Ohm
MDC IDC PG IMPLANT DT: 20130830
MDC IDC SET LEADCHNL RV SENSING SENSITIVITY: 2.5 mV
Pulse Gen Serial Number: 134132

## 2016-03-20 ENCOUNTER — Ambulatory Visit
Admission: RE | Admit: 2016-03-20 | Discharge: 2016-03-20 | Disposition: A | Discharge status: Home / Self Care | Payer: Medicare Other | Source: Ambulatory Visit | Attending: Radiation Oncology | Admitting: Radiation Oncology

## 2016-03-20 DIAGNOSIS — M129 Arthropathy, unspecified: Secondary | ICD-10-CM | POA: Diagnosis not present

## 2016-03-20 DIAGNOSIS — Z170 Estrogen receptor positive status [ER+]: Secondary | ICD-10-CM | POA: Diagnosis not present

## 2016-03-20 DIAGNOSIS — Z510 Encounter for antineoplastic radiation therapy: Secondary | ICD-10-CM | POA: Diagnosis not present

## 2016-03-20 DIAGNOSIS — J45909 Unspecified asthma, uncomplicated: Secondary | ICD-10-CM | POA: Diagnosis not present

## 2016-03-20 DIAGNOSIS — C50411 Malignant neoplasm of upper-outer quadrant of right female breast: Secondary | ICD-10-CM | POA: Diagnosis not present

## 2016-03-20 DIAGNOSIS — N62 Hypertrophy of breast: Secondary | ICD-10-CM | POA: Diagnosis not present

## 2016-04-01 ENCOUNTER — Ambulatory Visit (INDEPENDENT_AMBULATORY_CARE_PROVIDER_SITE_OTHER): Payer: Medicare Other | Admitting: General Surgery

## 2016-04-01 ENCOUNTER — Ambulatory Visit: Payer: Medicare Other | Admitting: General Surgery

## 2016-04-01 VITALS — BP 124/68 | HR 74 | Resp 12 | Ht 68.0 in | Wt 182.0 lb

## 2016-04-01 DIAGNOSIS — C50411 Malignant neoplasm of upper-outer quadrant of right female breast: Secondary | ICD-10-CM

## 2016-04-01 DIAGNOSIS — Z170 Estrogen receptor positive status [ER+]: Secondary | ICD-10-CM

## 2016-04-01 MED ORDER — LETROZOLE 2.5 MG PO TABS
2.5000 mg | ORAL_TABLET | Freq: Every day | ORAL | 3 refills | Status: DC
Start: 2016-04-01 — End: 2016-06-10

## 2016-04-01 NOTE — Progress Notes (Signed)
Patient ID: Diana Cook, female   DOB: 09-17-1950, 66 y.o.   MRN: [id number]  Chief Complaint  Patient presents with  . Breast Cancer    HPI Diana Cook is a 66 y.o. female here today for follow up right breast cancer. Occasionally shooting pains that come and go. Final treatment 03/20/2016.  She is here today with her husband, Diana Cook. They are recovering from the recent loss of her daughter.  HPI  Past Medical History:  Diagnosis Date  . Allergic rhinitis   . Arthritis   . Asthma    as a child, mild now  . Cancer [facility])    breast  . Colon polyps    colonoscopy 7/08, tubular adenoma  . Complete heart block ([place]) 8/13   s/p PPM implanted in [facility]  . COPD (chronic obstructive pulmonary disease) ([place])   . Myocardial infarction 2011  . Pacemaker    2011  . Seizure disorder ([place])   . Seizures ([place])    first one was when she was 66 years old   . Small bowel obstruction    1988 and 2002  . Tobacco abuse     Past Surgical History:  Procedure Laterality Date  . ABDOMINAL HYSTERECTOMY    . BREAST BIOPSY Right 11/2005   benign inflammatory changes, mass due to underwire bra  . BREAST BIOPSY Left 01/02/2016   Procedure: BREAST BIOPSY WITH BRACKETING WIRES;  Surgeon: [name], MD;  Location: [facility] ORS;  Service: General;  Laterality: Left;  . BREAST LUMPECTOMY WITH SENTINEL LYMPH NODE BIOPSY Right 01/02/2016   Procedure: BREAST LUMPECTOMY WITH SENTINEL LYMPH NODE BX;  Surgeon: [name], MD;  Location: [facility] ORS;  Service: General;  Laterality: Right;  . CATARACT EXTRACTION Bilateral   . COLONOSCOPY  10/2015   Dr Ardis Hughs  . gyn surgery  1993   hysterectomy- form endometriosis  . LAPAROSCOPY    . PACEMAKER INSERTION  10/24/11   [employer] Advantio dual chamber PPM implanted by Dr Bunnie Philips at [facility] in [place]  . SMALL INTESTINE SURGERY  12/2000   for blockage    Family History  Problem Relation Age of Onset  . Stroke  Mother   . Heart disease Mother     MI  . Dementia Mother   . Coronary artery disease Father   . Parkinsonism Father   . Cancer - Cervical Daughter 61    died 04/07/2022  . Colon cancer Neg Hx   . Breast cancer Neg Hx     Social History Social History  Substance Use Topics  . Smoking status: Former Smoker    Packs/day: 0.40    Years: 40.00    Types: Cigarettes    Quit date: 11/11/2010  . Smokeless tobacco: Never Used  . Alcohol use 4.2 oz/week    7 Glasses of wine per week     Comment: wine (pt said it's very occ)    Allergies  Allergen Reactions  . Codeine Nausea And Vomiting    REACTION: nausea  . Morphine And Related Nausea Only    Nausea     Current Outpatient Prescriptions  Medication Sig Dispense Refill  . acetaminophen (TYLENOL) 500 MG tablet Take 500 mg by mouth every 6 (six) hours as needed.    Marland Kitchen albuterol (PROVENTIL HFA;VENTOLIN HFA) 108 (90 Base) MCG/ACT inhaler Inhale 2 puffs into the lungs every 6 (six) hours as needed for wheezing or shortness of breath. 1 Inhaler 11  .  ALPRAZolam (XANAX) 0.5 MG tablet Take 1 tablet (0.5 mg total) by mouth 2 (two) times daily as needed for anxiety. 30 tablet 0  . Calcium-Vitamin D-Vitamin K 408-144-81 MG-UNT-MCG TABS Take 2 tablets by mouth daily.    . Diphenhydramine-APAP 25-500 MG TABS Take 1 tablet by mouth at bedtime as needed.    . furosemide (LASIX) 20 MG tablet TAKE ONE TABLET BY MOUTH ONCE DAILY AS NEEDED (Patient taking differently: TAKE ONE TABLET BY MOUTH ONCE DAILY AS NEEDED FOR FLUID RETENTION/SWELLING) 30 tablet 3  . traMADol (ULTRAM) 50 MG tablet 50 mg every 6 (six) hours as needed.     . tretinoin (RETIN-A) 0.1 % cream Apply topically at bedtime. As directed for facial wrinkles (Patient taking differently: Apply 1 application topically 3 (three) times a week. ) 20 g 3  . zolpidem (AMBIEN) 10 MG tablet TAKE ONE-HALF TO ONE TABLET BY MOUTH AT BEDTIME AS NEEDED FOR SLEEP (Patient taking differently: Take 5 mg by mouth  at bedtime. TAKE ONE-HALF TO ONE TABLET BY MOUTH AT BEDTIME AS NEEDED FOR SLEEP) 30 tablet 3  . letrozole (FEMARA) 2.5 MG tablet Take 1 tablet (2.5 mg total) by mouth daily. 90 tablet 3   No current facility-administered medications for this visit.     Review of Systems Review of Systems  Constitutional: Negative.   Respiratory: Negative.   Cardiovascular: Negative.     Blood pressure 124/68, pulse 74, resp. rate 12, height '5\' 8"'$  (1.727 m), weight 182 lb (82.6 kg).  Physical Exam Physical Exam  Constitutional: She is oriented to person, place, and time. She appears well-developed and well-nourished.  Eyes: Conjunctivae are normal. No scleral icterus.  Neck: Neck supple.  Cardiovascular: Normal rate, regular rhythm and normal heart sounds.   Pulmonary/Chest: Effort normal and breath sounds normal. Right breast exhibits no inverted nipple, no mass, no nipple discharge, no skin change and no tenderness. Left breast exhibits no inverted nipple, no mass, no nipple discharge, no skin change and no tenderness.    Right breast lumpectomy site is clean and healing well.   Lymphadenopathy:    She has no cervical adenopathy.  Neurological: She is alert and oriented to person, place, and time.  Skin: Skin is warm and dry.  Psychiatric: Her behavior is normal.    Data Reviewed Right breast pathology of 01/06/2016: pT1c, N0; ER/ PR 100%; Her 2 neu not over expressed  Assessment    Doing well status pobreast radiation (failed MammoSite).    Plan    Indications for antiestrogen therapy reviewed.  Opportunity for formal medical oncology assessment Discussed, declined at this time.  Follow up in 8 months bilateral diagnostic mammograms and office visit. Letrozole 2.'5mg'$  sent she will call back with progress report after one month. Last bone density was 11-22-15. Osteopenia noted.  Repeat exam 1 year after aromatase inhibitor therapy would be appropriate. Patient advised regarding  adequate calcium and vitamin D intake.     The patient is aware to use a heating pad as needed for comfort.  This information has been scribed by Gaspar Cola CMA.   [name] 04/02/2016, 7:38 PM

## 2016-04-01 NOTE — Patient Instructions (Addendum)
The patient is aware to use a heating pad as needed for comfort. Follow up in 8 months bilateral diagnostic mammograms and office visit. The patient is aware to call back for any questions or concerns.

## 2016-04-02 ENCOUNTER — Encounter: Payer: Self-pay | Admitting: General Surgery

## 2016-04-11 ENCOUNTER — Ambulatory Visit (INDEPENDENT_AMBULATORY_CARE_PROVIDER_SITE_OTHER): Payer: Medicare Other | Admitting: Family Medicine

## 2016-04-11 ENCOUNTER — Encounter: Payer: Self-pay | Admitting: Family Medicine

## 2016-04-11 VITALS — BP 114/66 | HR 65 | Temp 97.8°F | Ht 68.0 in | Wt 178.2 lb

## 2016-04-11 DIAGNOSIS — Z170 Estrogen receptor positive status [ER+]: Secondary | ICD-10-CM | POA: Diagnosis not present

## 2016-04-11 DIAGNOSIS — I442 Atrioventricular block, complete: Secondary | ICD-10-CM

## 2016-04-11 DIAGNOSIS — F4320 Adjustment disorder, unspecified: Secondary | ICD-10-CM

## 2016-04-11 DIAGNOSIS — C50411 Malignant neoplasm of upper-outer quadrant of right female breast: Secondary | ICD-10-CM | POA: Diagnosis not present

## 2016-04-11 DIAGNOSIS — F4321 Adjustment disorder with depressed mood: Secondary | ICD-10-CM

## 2016-04-11 MED ORDER — ZOLPIDEM TARTRATE 10 MG PO TABS: ORAL_TABLET | ORAL | 5 refills | Status: DC

## 2016-04-11 MED ORDER — FLUOXETINE HCL 20 MG PO TABS: 20.0000 mg | ORAL_TABLET | Freq: Every day | ORAL | 11 refills | Status: DC

## 2016-04-11 NOTE — Assessment & Plan Note (Signed)
Stable with pace maker

## 2016-04-11 NOTE — Progress Notes (Signed)
Subjective:    Patient ID: Diana Cook, female    DOB: 01-20-1951, 66 y.o.   MRN: [id number]  HPI Here for chronic medical problems and also to address grief rxn after loosing her daughter to cervical cancer last month   Wt Readings from Last 3 Encounters:  04/11/16 178 lb 4 oz (80.9 kg)  04/01/16 182 lb (82.6 kg)  02/05/16 177 lb (80.3 kg)  27.1 bmi  Worried about gaining more weight    She sees cardiology for hx of heart block and pacing  Takes lasix   BP Readings from Last 3 Encounters:  04/11/16 114/66  04/01/16 124/68  02/05/16 116/64     Uses trentoin for prevention of facial wrinkles   Hx of breast cancer on femara  Some night sweats    Has an albuterol mdi to use for prn wheeze (former smoker)   Lost her daughter last month  Was given px for xanax 0.5 mg to help with anx/sleep at night time  Given 30 pills on 1/23 with no refills   Not sleeping well / has always had problems  At times feels like she is not in control of her emotions (sadness)  Feels jumpy inside Brain is very scattered   Eats well -is able to eat  She used to exercise regularly   She plans on working with grief counseling / hospice  Hospice gave her great resources  Has 74, 51 yo grand kids  Doing a lot of driving    Has taken ambien 5-10 mg for sleep in the past  This does help her sleep at least for 2-4 hours  Patient Active Problem List   Diagnosis Date Noted  . Malignant neoplasm of upper-outer quadrant of right breast in female, estrogen receptor positive ([place]) 12/11/2015  . Osteopenia 11/22/2015  . Estrogen deficiency 09/07/2015  . Bruising 09/07/2015  . Wrinkles 09/07/2015  . Ringworm of body 05/22/2015  . Post herpetic neuralgia 09/01/2014  . Encounter for Medicare annual wellness exam 07/11/2014  . Routine general medical examination at a health care facility 06/26/2013  . H/O small bowel obstruction 04/15/2013  . Dyspnea on exertion 02/10/2013  . Complete heart  block ([place]) 11/03/2011  . [employer] 11/03/2011  . Lumbar spinal stenosis 09/02/2011  . POSTMENOPAUSAL STATUS 08/06/2009  . HERNIATED DISC 12/14/2007  . BACK, LOWER, PAIN 12/02/2007  . HX, PERSONAL, COLONIC POLYPS 09/14/2006  . Former smoker 06/10/2006  . ASTHMA 06/10/2006  . H/O idiopathic seizure 06/10/2006   Past Medical History:  Diagnosis Date  . Allergic rhinitis   . Arthritis   . Asthma    as a child, mild now  . Cancer [facility])    breast  . Colon polyps    colonoscopy 7/08, tubular adenoma  . Complete heart block ([place]) 8/13   s/p PPM implanted in [facility]  . COPD (chronic obstructive pulmonary disease) ([place])   . Myocardial infarction 2011  . Pacemaker    2011  . Seizure disorder ([place])   . Seizures ([place])    first one was when she was 66 years old   . Small bowel obstruction    1988 and 2002  . Tobacco abuse    Past Surgical History:  Procedure Laterality Date  . ABDOMINAL HYSTERECTOMY    . BREAST BIOPSY Right 11/2005   benign inflammatory changes, mass due to underwire bra  . BREAST BIOPSY Left 01/02/2016   columnar cell changes without atypical hyperplasia.  Marland Kitchen BREAST  LUMPECTOMY WITH SENTINEL LYMPH NODE BIOPSY Right 01/02/2016   Procedure: BREAST LUMPECTOMY WITH SENTINEL LYMPH NODE BX;  Surgeon: [name], MD;  Location: [facility] ORS;  Service: General;  Laterality: Right;  . CATARACT EXTRACTION Bilateral   . COLONOSCOPY  10/2015   Dr Ardis Hughs  . gyn surgery  1993   hysterectomy- form endometriosis  . LAPAROSCOPY    . PACEMAKER INSERTION  10/24/11   [employer] Advantio dual chamber PPM implanted by Dr Bunnie Philips at [facility] in [place]  . SMALL INTESTINE SURGERY  12/2000   for blockage   Social History  Substance Use Topics  . Smoking status: Former Smoker    Packs/day: 0.40    Years: 40.00    Types: Cigarettes    Quit date: 11/11/2010  . Smokeless tobacco: Never Used  . Alcohol use 4.2 oz/week    7  Glasses of wine per week     Comment: wine (pt said it's very occ)   Family History  Problem Relation Age of Onset  . Stroke Mother   . Heart disease Mother     MI  . Dementia Mother   . Coronary artery disease Father   . Parkinsonism Father   . Cancer - Cervical Daughter 4    died 03-26-22  . Colon cancer Neg Hx   . Breast cancer Neg Hx    Allergies  Allergen Reactions  . Codeine Nausea And Vomiting    REACTION: nausea  . Morphine And Related Nausea Only    Nausea    Current Outpatient Prescriptions on File Prior to Visit  Medication Sig Dispense Refill  . acetaminophen (TYLENOL) 500 MG tablet Take 500 mg by mouth every 6 (six) hours as needed.    Marland Kitchen albuterol (PROVENTIL HFA;VENTOLIN HFA) 108 (90 Base) MCG/ACT inhaler Inhale 2 puffs into the lungs every 6 (six) hours as needed for wheezing or shortness of breath. 1 Inhaler 11  . ALPRAZolam (XANAX) 0.5 MG tablet Take 1 tablet (0.5 mg total) by mouth 2 (two) times daily as needed for anxiety. 30 tablet 0  . Calcium-Vitamin D-Vitamin K 962-229-79 MG-UNT-MCG TABS Take 2 tablets by mouth daily.    . Diphenhydramine-APAP 25-500 MG TABS Take 1 tablet by mouth at bedtime as needed.    . furosemide (LASIX) 20 MG tablet TAKE ONE TABLET BY MOUTH ONCE DAILY AS NEEDED (Patient taking differently: TAKE ONE TABLET BY MOUTH ONCE DAILY AS NEEDED FOR FLUID RETENTION/SWELLING) 30 tablet 3  . letrozole (FEMARA) 2.5 MG tablet Take 1 tablet (2.5 mg total) by mouth daily. 90 tablet 3  . traMADol (ULTRAM) 50 MG tablet 50 mg every 6 (six) hours as needed.     . tretinoin (RETIN-A) 0.1 % cream Apply topically at bedtime. As directed for facial wrinkles (Patient taking differently: Apply 1 application topically 3 (three) times a week. ) 20 g 3   No current facility-administered medications on file prior to visit.     Review of Systems    Review of Systems  Constitutional: Negative for fever, appetite change, fatigue and unexpected weight change.  Eyes:  Negative for pain and visual disturbance.  Respiratory: Negative for cough and shortness of breath.   Cardiovascular: Negative for cp or palpitations    Gastrointestinal: Negative for nausea, diarrhea and constipation.  Genitourinary: Negative for urgency and frequency.  Skin: Negative for pallor or rash   Neurological: Negative for weakness, light-headedness, numbness and headaches.  Hematological: Negative for adenopathy. Does not bruise/bleed  easily.  Psychiatric/Behavioral: pos for grief reaction with dep and anx symptoms/ neg for SI.      Objective:   Physical Exam  Constitutional: She appears well-developed and well-nourished. No distress.  Well appearing   HENT:  Head: Normocephalic and atraumatic.  Mouth/Throat: Oropharynx is clear and moist.  Eyes: Conjunctivae and EOM are normal. Pupils are equal, round, and reactive to light.  Neck: Normal range of motion. Neck supple. No JVD present. Carotid bruit is not present. No thyromegaly present.  Cardiovascular: Normal rate, regular rhythm, normal heart sounds and intact distal pulses.  Exam reveals no gallop.   Pulmonary/Chest: Effort normal and breath sounds normal. No respiratory distress. She has no wheezes. She has no rales.  No crackles  Abdominal: Soft. Bowel sounds are normal. She exhibits no distension, no abdominal bruit and no mass. There is no tenderness.  Musculoskeletal: She exhibits no edema.  Lymphadenopathy:    She has no cervical adenopathy.  Neurological: She is alert. She has normal reflexes. No cranial nerve deficit. She exhibits normal muscle tone. Coordination normal.  Skin: Skin is warm and dry. No rash noted.  Psychiatric: Her speech is normal and behavior is normal. Thought content normal. Her mood appears not anxious. Her affect is not blunt, not labile and not inappropriate. Thought content is not paranoid. Cognition and memory are normal. She exhibits a depressed mood. She expresses no homicidal and no  suicidal ideation.  Tearful at times when disc loss of her daughter  She recovers quickly  Attentive and with good insight           Assessment & Plan:   Problem List Items Addressed This Visit      Cardiovascular and Mediastinum   Complete heart block (HCC) - Primary    Stable with pace maker        Other   Grief reaction    Recently lost her daughter to cervical cancer - is left with her husband and 2 kids  Reviewed stressors/ coping techniques/symptoms/ support sources/ tx options and side effects in detail today Staying busy and distracted Stressed that is is ok to feel sad and go through that range of emotions (she is worried about lack of control of feelings) No SI She plans to start some grief counseling in the future but she is not ready yet  Wants to try medication to help mood Will try fluoxetine 20 mg (10 and titrate to 20 as tol) Discussed expectations of SSRI medication including time to effectiveness and mechanism of action, also poss of side effects (early and late)- including mental fuzziness, weight or appetite change, nausea and poss of worse dep or anxiety (even suicidal thoughts)  Pt voiced understanding and will stop med and update if this occurs   Will plan f/u based on response Enc her to continue exercise and self care Will alert [place] if problems or side eff, and when she is ready for counseling >25 minutes spent in face to face time with patient, >50% spent in counselling or coordination of care       Malignant neoplasm of upper-outer quadrant of right breast in female, estrogen receptor positive ([place])    On first week of femara and tolerating it with some night sweats

## 2016-04-11 NOTE — Progress Notes (Signed)
Pre visit review using our clinic review tool, if applicable. No additional management support is needed unless otherwise documented below in the visit note. 

## 2016-04-11 NOTE — Patient Instructions (Addendum)
Exercise helps mood- get back to it when you can   Take advantage of help with grief when you can   Find out which albuterol inhaler your insurance covers best- pro air/ ventolin or proventil   (all generic and also HFA)  Let me know and we will change your px   Let's try fluoxetine 1/2 pill once daily for 1-2 weeks-then if doing well go up to 1 pill daily  Normal to feel a little spacey or nauseated for the first week or so - if more than that , stop it and let me know  If your mood gets worse or you feel suicidal - stop it and alert [place]  Lorrin Mais is ok for sleep

## 2016-04-11 NOTE — Assessment & Plan Note (Signed)
Recently lost her daughter to cervical cancer - is left with her husband and 2 kids  Reviewed stressors/ coping techniques/symptoms/ support sources/ tx options and side effects in detail today Staying busy and distracted Stressed that is is ok to feel sad and go through that range of emotions (she is worried about lack of control of feelings) No SI She plans to start some grief counseling in the future but she is not ready yet  Wants to try medication to help mood Will try fluoxetine 20 mg (10 and titrate to 20 as tol) Discussed expectations of SSRI medication including time to effectiveness and mechanism of action, also poss of side effects (early and late)- including mental fuzziness, weight or appetite change, nausea and poss of worse dep or anxiety (even suicidal thoughts)  Pt voiced understanding and will stop med and update if this occurs   Will plan f/u based on response Enc her to continue exercise and self care Will alert [place] if problems or side eff, and when she is ready for counseling >25 minutes spent in face to face time with patient, >50% spent in counselling or coordination of care

## 2016-04-11 NOTE — Assessment & Plan Note (Signed)
On first week of femara and tolerating it with some night sweats

## 2016-04-17 ENCOUNTER — Encounter: Payer: Self-pay | Admitting: *Deleted

## 2016-04-17 ENCOUNTER — Encounter: Payer: Self-pay | Admitting: Radiation Oncology

## 2016-04-17 ENCOUNTER — Ambulatory Visit
Admission: RE | Admit: 2016-04-17 | Discharge: 2016-04-17 | Disposition: A | Discharge status: Home / Self Care | Payer: Medicare Other | Source: Ambulatory Visit | Attending: Radiation Oncology | Admitting: Radiation Oncology

## 2016-04-17 VITALS — BP 124/60 | HR 61 | Temp 97.8°F | Wt 178.4 lb

## 2016-04-17 DIAGNOSIS — Z79811 Long term (current) use of aromatase inhibitors: Secondary | ICD-10-CM | POA: Insufficient documentation

## 2016-04-17 DIAGNOSIS — Z923 Personal history of irradiation: Secondary | ICD-10-CM | POA: Diagnosis not present

## 2016-04-17 DIAGNOSIS — Z170 Estrogen receptor positive status [ER+]: Secondary | ICD-10-CM | POA: Insufficient documentation

## 2016-04-17 DIAGNOSIS — C50411 Malignant neoplasm of upper-outer quadrant of right female breast: Secondary | ICD-10-CM

## 2016-04-17 NOTE — Progress Notes (Signed)
  Oncology Nurse Navigator Documentation  Navigator Location: [facility] (04/17/16 1500)   )Navigator Encounter Type: Letter/Fax/Email (04/17/16 1500)    Mailed patient a thinking of you card.  She completed her radiation therapy about a month ago.                                                Time Spent with Patient: 15 (04/17/16 1500)

## 2016-04-17 NOTE — Progress Notes (Signed)
Radiation Oncology Follow up Note  Name: JACOBY RITSEMA   Date:   04/17/2016 MRN:  [id number] DOB: 1950-06-23    This 66 y.o. female presents to the clinic today for one-month follow-up status post whole breast radiation to her right breast for stage I invasive mammary carcinoma.  REFERRING PROVIDER: Tower, Wynelle Fanny, MD  HPI: Patient is a 66 year old female. Now out 1 month having completed whole breast radiation to her right breast for ER/PR positive invasive mammary carcinoma status post wide local excision and sentinel node biopsy. We cannot to a MammoSite balloon on her she underwent whole breast radiation. She is seen today in routine follow-up and is doing well. She's currently on Femara tolerating that well without side effect. She specifically denies breast tenderness cough or bone pain. Unfortunately she recently lost her daughter to cancer.  COMPLICATIONS OF TREATMENT: none  FOLLOW UP COMPLIANCE: keeps appointments   PHYSICAL EXAM:  BP 124/60   Pulse 61   Temp 97.8 F (36.6 C)   Wt 178 lb 5.6 oz (80.9 kg)   BMI 27.12 kg/m  Lungs are clear to A&P cardiac examination essentially unremarkable with regular rate and rhythm. No dominant mass or nodularity is noted in either breast in 2 positions examined. Incision is well-healed. No axillary or supraclavicular adenopathy is appreciated. Cosmetic result is excellent. Well-developed well-nourished patient in NAD. HEENT reveals PERLA, EOMI, discs not visualized.  Oral cavity is clear. No oral mucosal lesions are identified. Neck is clear without evidence of cervical or supraclavicular adenopathy. Lungs are clear to A&P. Cardiac examination is essentially unremarkable with regular rate and rhythm without murmur rub or thrill. Abdomen is benign with no organomegaly or masses noted. Motor sensory and DTR levels are equal and symmetric in the upper and lower extremities. Cranial nerves II through XII are grossly intact. Proprioception is intact.  No peripheral adenopathy or edema is identified. No motor or sensory levels are noted. Crude visual fields are within normal range.  RADIOLOGY RESULTS: No current films for review  PLAN: At the present time patient is doing well 1 month out. She's currently on Femara tolerating that well without side effect. I'm please were overall progress. I've asked to see her back in 4-5 months for follow-up. She knows to call sooner with any concerns.  I would like to take this opportunity to thank you for allowing me to participate in the care of your patient.Armstead Peaks., MD

## 2016-05-02 ENCOUNTER — Encounter: Payer: Self-pay | Admitting: General Surgery

## 2016-06-02 ENCOUNTER — Encounter: Payer: Self-pay | Admitting: General Surgery

## 2016-06-10 ENCOUNTER — Encounter: Payer: Self-pay | Admitting: General Surgery

## 2016-06-10 ENCOUNTER — Ambulatory Visit (INDEPENDENT_AMBULATORY_CARE_PROVIDER_SITE_OTHER): Payer: Medicare Other | Admitting: General Surgery

## 2016-06-10 VITALS — BP 114/62 | HR 76 | Resp 12 | Ht 68.5 in | Wt 178.0 lb

## 2016-06-10 DIAGNOSIS — C50411 Malignant neoplasm of upper-outer quadrant of right female breast: Secondary | ICD-10-CM

## 2016-06-10 DIAGNOSIS — Z170 Estrogen receptor positive status [ER+]: Secondary | ICD-10-CM

## 2016-06-10 NOTE — Patient Instructions (Addendum)
Patient to send a Mychart message in 6 weeks

## 2016-06-10 NOTE — Progress Notes (Signed)
Patient ID: Diana Cook, female   DOB: Apr 05, 1950, 66 y.o.   MRN: [id number]  Chief Complaint  Patient presents with  . Other    HPI Diana Cook is a 66 y.o. female.  She is here today to discuss medication management of her Femara. She stopped taking it about a week ago. She was having weight gain, loose stools (diarrhea), urine (strong smell), night sweats (not as often). She states she is hungry "all the time". She is walking 3 miles a day. The patient reports that if she does need her stomach rambles although this is not associated with any reflux symptoms. HPI  Past Medical History:  Diagnosis Date  . Allergic rhinitis   . Arthritis   . Asthma    as a child, mild now  . Cancer [facility])    breast  . Colon polyps    colonoscopy 7/08, tubular adenoma  . Complete heart block ([place]) 8/13   s/p PPM implanted in [facility]  . COPD (chronic obstructive pulmonary disease) ([place])   . Myocardial infarction ([place]) 2011  . Pacemaker    2011  . Seizure disorder ([place])   . Seizures ([place])    first one was when she was 66 years old   . Small bowel obstruction ([place])    1988 and 2002  . Tobacco abuse     Past Surgical History:  Procedure Laterality Date  . ABDOMINAL HYSTERECTOMY    . BREAST BIOPSY Right 11/2005   benign inflammatory changes, mass due to underwire bra  . BREAST BIOPSY Left 01/02/2016   columnar cell changes without atypical hyperplasia.  Marland Kitchen BREAST LUMPECTOMY WITH SENTINEL LYMPH NODE BIOPSY Right 01/02/2016   Procedure: BREAST LUMPECTOMY WITH SENTINEL LYMPH NODE BX;  Surgeon: [name], MD;  Location: [facility] ORS;  Service: General;  Laterality: Right;  . CATARACT EXTRACTION Bilateral   . COLONOSCOPY  10/2015   Dr Ardis Hughs  . gyn surgery  1993   hysterectomy- form endometriosis  . LAPAROSCOPY    . PACEMAKER INSERTION  10/24/11   [employer] Advantio dual chamber PPM implanted by Dr Bunnie Philips at [facility] in [place]  . SMALL INTESTINE  SURGERY  12/2000   for blockage    Family History  Problem Relation Age of Onset  . Stroke Mother   . Heart disease Mother     MI  . Dementia Mother   . Coronary artery disease Father   . Parkinsonism Father   . Cancer - Cervical Daughter 75    died 2022/04/12  . Colon cancer Neg Hx   . Breast cancer Neg Hx     Social History Social History  Substance Use Topics  . Smoking status: Former Smoker    Packs/day: 0.40    Years: 40.00    Types: Cigarettes    Quit date: 11/11/2010  . Smokeless tobacco: Never Used  . Alcohol use 4.2 oz/week    7 Glasses of wine per week     Comment: wine (pt said it's very occ)    Allergies  Allergen Reactions  . Codeine Nausea And Vomiting    REACTION: nausea  . Morphine And Related Nausea Only    Nausea     Current Outpatient Prescriptions  Medication Sig Dispense Refill  . acetaminophen (TYLENOL) 500 MG tablet Take 500 mg by mouth every 6 (six) hours as needed.    Marland Kitchen albuterol (PROVENTIL HFA;VENTOLIN HFA) 108 (90 Base) MCG/ACT inhaler Inhale 2 puffs  into the lungs every 6 (six) hours as needed for wheezing or shortness of breath. 1 Inhaler 11  . ALPRAZolam (XANAX) 0.5 MG tablet Take 1 tablet (0.5 mg total) by mouth 2 (two) times daily as needed for anxiety. 30 tablet 0  . Calcium-Vitamin D-Vitamin K 499-692-49 MG-UNT-MCG TABS Take 2 tablets by mouth daily.    . Diphenhydramine-APAP 25-500 MG TABS Take 1 tablet by mouth at bedtime as needed.    Marland Kitchen FLUoxetine (PROZAC) 20 MG tablet Take 1 tablet (20 mg total) by mouth daily. 30 tablet 11  . furosemide (LASIX) 20 MG tablet TAKE ONE TABLET BY MOUTH ONCE DAILY AS NEEDED (Patient taking differently: TAKE ONE TABLET BY MOUTH ONCE DAILY AS NEEDED FOR FLUID RETENTION/SWELLING) 30 tablet 3  . traMADol (ULTRAM) 50 MG tablet 50 mg every 6 (six) hours as needed.     . tretinoin (RETIN-A) 0.1 % cream Apply topically at bedtime. As directed for facial wrinkles (Patient taking differently: Apply 1 application  topically 3 (three) times a week. ) 20 g 3  . zolpidem (AMBIEN) 10 MG tablet TAKE ONE-HALF TO ONE TABLET BY MOUTH AT BEDTIME AS NEEDED FOR SLEEP 30 tablet 5   No current facility-administered medications for this visit.     Review of Systems Review of Systems  Constitutional: Positive for appetite change.  Respiratory: Negative.   Cardiovascular: Negative.   Gastrointestinal: Positive for diarrhea.    Blood pressure 114/62, pulse 76, resp. rate 12, height 5' 8.5" (1.74 m), weight 178 lb (80.7 kg).  Physical Exam Physical Exam  Data Reviewed Her Mammoprint testing showed her to have a -0.316 score, low risk for recurrent disease without adjuvant chemotherapy but with adjuvant estrogen blockade. The ultra low risk group was that -0.355 who showed an 3% lower risk of long-term survival without  antiestrogen therapy.  Assessment    Intolerance of letrozole.    Plan    The patient will continue off of her letrozole (stopped 1 week ago) sees and give a phone follow-up in 6 weeks. At that time we'll discuss a trial of one of the other agents or even tamoxifen.  If she is unable to tolerate any estrogen suppression will probably lose for 5 points of long-term survival but may make her life much more tolerable with the multiple symptoms she is presently experiencing.  We can also discuss formal medical oncology assessment for the role of chemotherapy if estrogen therapy is not tolerated.     Patient to send a Mychart message in 6 weeks  [name] 06/11/2016, 9:11 PM

## 2016-06-24 ENCOUNTER — Ambulatory Visit (INDEPENDENT_AMBULATORY_CARE_PROVIDER_SITE_OTHER): Payer: Medicare Other | Admitting: General Surgery

## 2016-06-24 ENCOUNTER — Encounter: Payer: Self-pay | Admitting: General Surgery

## 2016-06-24 VITALS — BP 100/62 | HR 66 | Resp 12 | Ht 68.5 in | Wt 180.0 lb

## 2016-06-24 DIAGNOSIS — N644 Mastodynia: Secondary | ICD-10-CM | POA: Insufficient documentation

## 2016-06-24 NOTE — Progress Notes (Signed)
Patient ID: Diana Cook, female   DOB: [birth year], 66 y.o.   MRN: [id number]  Chief Complaint  Patient presents with  . Other    right breast pain    HPI Diana Cook is a 66 y.o. female.  Here today for evaluation of right breast pain post fall on 06-23-16. She tripped in the yard playing basketball with her granddaughter. She fell on her right side. She states she is sore and pain when she takes a deep breath.   HPI  Past Medical History:  Diagnosis Date  . Allergic rhinitis   . Arthritis   . Asthma    as a child, mild now  . Cancer [facility])    breast  . Colon polyps    colonoscopy 7/08, tubular adenoma  . Complete heart block ([place]) 8/13   s/p PPM implanted in [facility]  . COPD (chronic obstructive pulmonary disease) ([place])   . Myocardial infarction ([place]) 2011  . Pacemaker    2011  . Seizure disorder ([place])   . Seizures ([place])    first one was when she was 66 years old   . Small bowel obstruction ([place])    1988 and 2002  . Tobacco abuse     Past Surgical History:  Procedure Laterality Date  . ABDOMINAL HYSTERECTOMY    . BREAST BIOPSY Right 11/2005   benign inflammatory changes, mass due to underwire bra  . BREAST BIOPSY Left 01/02/2016   columnar cell changes without atypical hyperplasia.  Marland Kitchen BREAST LUMPECTOMY WITH SENTINEL LYMPH NODE BIOPSY Right 01/02/2016   Procedure: BREAST LUMPECTOMY WITH SENTINEL LYMPH NODE BX;  Surgeon: [name], MD;  Location: [facility] ORS;  Service: General;  Laterality: Right;  . CATARACT EXTRACTION Bilateral   . COLONOSCOPY  10/2015   Dr Ardis Hughs  . gyn surgery  1993   hysterectomy- form endometriosis  . LAPAROSCOPY    . PACEMAKER INSERTION  10/24/11   [employer] Advantio dual chamber PPM implanted by Dr Bunnie Philips at [facility] in [place]  . SMALL INTESTINE SURGERY  12/2000   for blockage    Family History  Problem Relation Age of Onset  . Stroke Mother   . Heart disease Mother     MI  . Dementia  Mother   . Coronary artery disease Father   . Parkinsonism Father   . Cancer - Cervical Daughter 88    died 04/06/2022  . Colon cancer Neg Hx   . Breast cancer Neg Hx     Social History Social History  Substance Use Topics  . Smoking status: Former Smoker    Packs/day: 0.40    Years: 40.00    Types: Cigarettes    Quit date: 11/11/2010  . Smokeless tobacco: Never Used  . Alcohol use 4.2 oz/week    7 Glasses of wine per week     Comment: wine (pt said it's very occ)    Allergies  Allergen Reactions  . Codeine Nausea And Vomiting    REACTION: nausea  . Morphine And Related Nausea Only    Nausea     Current Outpatient Prescriptions  Medication Sig Dispense Refill  . acetaminophen (TYLENOL) 500 MG tablet Take 500 mg by mouth every 6 (six) hours as needed.    Marland Kitchen albuterol (PROVENTIL HFA;VENTOLIN HFA) 108 (90 Base) MCG/ACT inhaler Inhale 2 puffs into the lungs every 6 (six) hours as needed for wheezing or shortness of breath. 1 Inhaler 11  . ALPRAZolam (  XANAX) 0.5 MG tablet Take 1 tablet (0.5 mg total) by mouth 2 (two) times daily as needed for anxiety. 30 tablet 0  . Calcium-Vitamin D-Vitamin K 500-370-48 MG-UNT-MCG TABS Take 2 tablets by mouth daily.    . Diphenhydramine-APAP 25-500 MG TABS Take 1 tablet by mouth at bedtime as needed.    Marland Kitchen FLUoxetine (PROZAC) 20 MG tablet Take 1 tablet (20 mg total) by mouth daily. 30 tablet 11  . furosemide (LASIX) 20 MG tablet TAKE ONE TABLET BY MOUTH ONCE DAILY AS NEEDED (Patient taking differently: TAKE ONE TABLET BY MOUTH ONCE DAILY AS NEEDED FOR FLUID RETENTION/SWELLING) 30 tablet 3  . tretinoin (RETIN-A) 0.1 % cream Apply topically at bedtime. As directed for facial wrinkles (Patient taking differently: Apply 1 application topically 3 (three) times a week. ) 20 g 3  . zolpidem (AMBIEN) 10 MG tablet TAKE ONE-HALF TO ONE TABLET BY MOUTH AT BEDTIME AS NEEDED FOR SLEEP 30 tablet 5   No current facility-administered medications for this visit.      Review of Systems Review of Systems  Constitutional: Negative.   Respiratory: Positive for shortness of breath.   Cardiovascular: Negative.     Blood pressure 100/62, pulse 66, resp. rate 12, height 5' 8.5" (1.74 m), weight 180 lb (81.6 kg), SpO2 95 %.  Physical Exam Physical Exam  Constitutional: She is oriented to person, place, and time. She appears well-developed and well-nourished.  HENT:  Mouth/Throat: Oropharynx is clear and moist.  Eyes: Conjunctivae are normal. No scleral icterus.  Neck: Neck supple.  Cardiovascular: Normal rate, regular rhythm and normal heart sounds.   Pulmonary/Chest: Effort normal and breath sounds normal. Right breast exhibits tenderness. Right breast exhibits no inverted nipple, no mass, no nipple discharge and no skin change.    Tender right breast at T8 along right axillary line without underlying crepitance or step-off.  Clear breath sounds bilaterally. No pleural friction rub.  Musculoskeletal:       Arms: Right wrist 2 ganglion cysts.  Lymphadenopathy:    She has no cervical adenopathy.  Neurological: She is alert and oriented to person, place, and time.  Skin: Skin is warm and dry.  Psychiatric: Her behavior is normal.       Assessment    Soft tissue trauma of the right breast/chest wall secondary to fall.  Symptomatic ganglion right wrist.    Plan         Follow up as scheduled. May apply heating pad for comfort and take tylenol for pain. Refer to [facility] for ganglion cyst  HPI, Physical Exam, Assessment and Plan have been scribed under the direction and in the presence of [name], MD.  Karie Fetch, RN I have completed the exam and reviewed the above documentation for accuracy and completeness.  I agree with the above.  Haematologist has been used and any errors in dictation or transcription are unintentional.  Hervey Ard, M.D., F.A.C.S.   [name] 06/24/2016, 1:33 PM

## 2016-06-24 NOTE — Patient Instructions (Addendum)
The patient is aware to call back for any questions or concerns.    Follow up as scheduled. May apply heating pad for comfort and take tylenol for pain. Refer to [facility] for ganglion cyst

## 2016-07-01 DIAGNOSIS — M67431 Ganglion, right wrist: Secondary | ICD-10-CM | POA: Diagnosis not present

## 2016-07-30 ENCOUNTER — Encounter: Payer: Self-pay | Admitting: General Surgery

## 2016-08-02 NOTE — Telephone Encounter (Signed)
The patient has been off Femara for 8 weeks and feeling markedly better. Her gnawing hunger present all the time while on the medication has nearly completely resolved. Weight is down 4 pounds by her report. No further diarrhea. Dark urine has cleared.  We discussed by phone options for management. As she is taking Prozac, tamoxifen which would be my first choice in light of her recently identified osteopenia is less ideal as it would require a change in her antidepressant therapy.  She's going to take a week at the [place] with her sister, sister-in-law and nieces later this month.  I've asked her make use of anastrozole 1 mg daily after she returns from the beach and will get together the end of July to review her tolerance of the medication.

## 2016-08-05 ENCOUNTER — Telehealth: Payer: Self-pay | Admitting: *Deleted

## 2016-08-05 MED ORDER — ANASTROZOLE 1 MG PO TABS: 1.0000 mg | ORAL_TABLET | Freq: Every day | ORAL | 0 refills | Status: DC

## 2016-08-05 NOTE — Telephone Encounter (Signed)
Note added to RX that pt would not pick up until after 6/21.

## 2016-08-05 NOTE — Telephone Encounter (Signed)
-----   Message from [name], MD sent at 08/02/2016 11:32 AM EDT ----- Please send in a prescription for anastrozole, 1 mg daily #30. Patient will start after return from beach trip 6/21.  Arrange a f/u appt in office at the end of July to review tolerance of medication. Thanks.

## 2016-08-19 NOTE — Telephone Encounter (Signed)
Notified patient as instructed, patient pleased. She has not started the medication as of yet. Discussed follow-up appointments, August 1, patient agrees

## 2016-08-20 ENCOUNTER — Encounter: Payer: Self-pay | Admitting: General Surgery

## 2016-08-26 NOTE — Progress Notes (Deleted)
Subjective:   Diana Cook is a 66 y.o. female who presents for Medicare Annual (Subsequent) preventive examination.  Review of Systems:  No ROS.  Medicare Wellness Visit. Additional risk factors are reflected in the social history.        Objective:     Vitals: There were no vitals taken for this visit.  There is no height or weight on file to calculate BMI.   Tobacco History  Smoking Status  . Former Smoker  . Packs/day: 0.40  . Years: 40.00  . Types: Cigarettes  . Quit date: 11/11/2010  Smokeless Tobacco  . Never Used     Counseling given: Not Answered   Past Medical History:  Diagnosis Date  . Allergic rhinitis   . Arthritis   . Asthma    as a child, mild now  . Cancer [facility])    breast  . Colon polyps    colonoscopy 7/08, tubular adenoma  . Complete heart block ([place]) 8/13   s/p PPM implanted in [facility]  . COPD (chronic obstructive pulmonary disease) ([place])   . Myocardial infarction ([place]) 2011  . Pacemaker    2011  . Seizure disorder ([place])   . Seizures ([place])    first one was when she was 66 years old   . Small bowel obstruction ([place])    1988 and 2002  . Tobacco abuse    Past Surgical History:  Procedure Laterality Date  . ABDOMINAL HYSTERECTOMY    . BREAST BIOPSY Right 11/2005   benign inflammatory changes, mass due to underwire bra  . BREAST BIOPSY Left 01/02/2016   columnar cell changes without atypical hyperplasia.  Marland Kitchen BREAST LUMPECTOMY WITH SENTINEL LYMPH NODE BIOPSY Right 01/02/2016   Procedure: BREAST LUMPECTOMY WITH SENTINEL LYMPH NODE BX;  Surgeon: [name], MD;  Location: [facility] ORS;  Service: General;  Laterality: Right;  . CATARACT EXTRACTION Bilateral   . COLONOSCOPY  10/2015   Dr Ardis Hughs  . EXPLORATORY LAPAROTOMY  01/25/2001   Exploratory laparotomy, lysis of adhesions, identification of internal hernia secondary to omental adhesion. Prolonged postoperative ileus.  Marland Kitchen gyn surgery  1993   hysterectomy- form endometriosis    . LAPAROSCOPY    . PACEMAKER INSERTION  10/24/11   [employer] Advantio dual chamber PPM implanted by Dr Bunnie Philips at [facility] in [place]   Family History  Problem Relation Age of Onset  . Stroke Mother   . Heart disease Mother        MI  . Dementia Mother   . Coronary artery disease Father   . Parkinsonism Father   . Cancer - Cervical Daughter 58       died 04/07/22  . Colon cancer Neg Hx   . Breast cancer Neg Hx    History  Sexual Activity  . Sexual activity: No    Outpatient Encounter Prescriptions as of 09/02/2016  Medication Sig  . acetaminophen (TYLENOL) 500 MG tablet Take 500 mg by mouth every 6 (six) hours as needed.  Marland Kitchen albuterol (PROVENTIL HFA;VENTOLIN HFA) 108 (90 Base) MCG/ACT inhaler Inhale 2 puffs into the lungs every 6 (six) hours as needed for wheezing or shortness of breath.  . ALPRAZolam (XANAX) 0.5 MG tablet Take 1 tablet (0.5 mg total) by mouth 2 (two) times daily as needed for anxiety.  Marland Kitchen anastrozole (ARIMIDEX) 1 MG tablet Take 1 tablet (1 mg total) by mouth daily.  . Calcium-Vitamin D-Vitamin K 750-500-40 MG-UNT-MCG TABS Take 2 tablets  by mouth daily.  . Diphenhydramine-APAP 25-500 MG TABS Take 1 tablet by mouth at bedtime as needed.  Marland Kitchen FLUoxetine (PROZAC) 20 MG tablet Take 1 tablet (20 mg total) by mouth daily.  . furosemide (LASIX) 20 MG tablet TAKE ONE TABLET BY MOUTH ONCE DAILY AS NEEDED (Patient taking differently: TAKE ONE TABLET BY MOUTH ONCE DAILY AS NEEDED FOR FLUID RETENTION/SWELLING)  . tretinoin (RETIN-A) 0.1 % cream Apply topically at bedtime. As directed for facial wrinkles (Patient taking differently: Apply 1 application topically 3 (three) times a week. )  . zolpidem (AMBIEN) 10 MG tablet TAKE ONE-HALF TO ONE TABLET BY MOUTH AT BEDTIME AS NEEDED FOR SLEEP   No facility-administered encounter medications on file as of 09/02/2016.     Activities of Daily Living In your present state of health, do you have any difficulty  performing the following activities: 12/21/2015  Hearing? N  Vision? N  Difficulty concentrating or making decisions? N  Walking or climbing stairs? N  Dressing or bathing? N  Doing errands, shopping? N  Some recent data might be hidden    Patient Care Team: Tower, Wynelle Fanny, MD as PCP - General Deboraha Sprang, MD as Consulting Physician (Cardiology) Lorelee Cover., MD as Referring Physician (Ophthalmology) Tower, Wynelle Fanny, MD as Consulting Physician (Family Medicine) Bary Castilla Forest Gleason, MD (General Surgery)    Assessment:    Physical assessment deferred to PCP.  Exercise Activities and Dietary recommendations    Goals    . Increase physical activity          Starting 07/26/2015, I will continue to walk for at least 45 min 3-5 days per week.       Fall Risk Fall Risk  07/26/2015 07/11/2014  Falls in the past year? No No   Depression Screen PHQ 2/9 Scores 07/26/2015 07/11/2014  PHQ - 2 Score 0 0     Cognitive Function MMSE - Mini Mental State Exam 07/26/2015  Orientation to time 5  Orientation to Place 5  Registration 3  Attention/ Calculation 0  Recall 2  Recall-comments pt was unable to recall 1 of 3 words  Language- name 2 objects 0  Language- repeat 1  Language- follow 3 step command 3  Language- read & follow direction 0  Write a sentence 0  Copy design 0  Total score 19        Immunization History  Administered Date(s) Administered  . Influenza Whole 02/24/2005  . Influenza,inj,Quad PF,36+ Mos 12/03/2012, 12/09/2013, 01/16/2015, 02/06/2016  . Pneumococcal Conjugate-13 07/11/2014  . Pneumococcal Polysaccharide-23 03/21/2005, 07/26/2015  . Td 01/09/1999, 06/14/2007  . Zoster 09/07/2015   Screening Tests Health Maintenance  Topic Date Due  . Hepatitis C Screening  03/29/2023 (Originally April 11, 1950)  . INFLUENZA VACCINE  09/24/2016  . MAMMOGRAM  12/03/2016  . TETANUS/TDAP  06/13/2017  . COLONOSCOPY  11/06/2018  . DEXA SCAN  Completed  . PNA vac Low  Risk Adult  Completed      Plan:    Follow-up w/ PCP as scheduled.   I have personally reviewed and noted the following in the patient's chart:   . Medical and social history . Use of alcohol, tobacco or illicit drugs  . Current medications and supplements . Functional ability and status . Nutritional status . Physical activity . Advanced directives . List of other physicians . Vitals . Screenings to include cognitive, depression, and falls . Referrals and appointments  In addition, I have reviewed and discussed with patient certain preventive  protocols, quality metrics, and best practice recommendations. A written personalized care plan for preventive services as well as general preventive health recommendations were provided to patient.     Dorrene German, RN  08/26/2016

## 2016-08-28 NOTE — Progress Notes (Deleted)
PCP notes:   Health maintenance:   Abnormal screenings:    Patient concerns:    Nurse concerns:   Next PCP appt:    

## 2016-08-31 ENCOUNTER — Telehealth: Payer: Self-pay | Admitting: Family Medicine

## 2016-08-31 DIAGNOSIS — Z0000 Encounter for general adult medical examination without abnormal findings: Secondary | ICD-10-CM

## 2016-08-31 DIAGNOSIS — R7309 Other abnormal glucose: Secondary | ICD-10-CM | POA: Insufficient documentation

## 2016-08-31 NOTE — Telephone Encounter (Signed)
-----   Message from Ellamae Sia sent at 08/29/2016 10:29 AM EDT ----- Regarding: Lab orders for Tuesday, 7.10.18  AWV lab orders, please.

## 2016-08-31 NOTE — Telephone Encounter (Signed)
I ordered regular physical labs- but if she just has medicare (not an advantage plan)=they may not pay for most of her labs  Please make sure she is ok with this before drawing her  Thanks

## 2016-09-01 NOTE — Telephone Encounter (Signed)
Noted, will discuss with the pt.

## 2016-09-02 ENCOUNTER — Ambulatory Visit: Payer: Medicare Other

## 2016-09-02 ENCOUNTER — Encounter: Payer: Self-pay | Admitting: Internal Medicine

## 2016-09-02 ENCOUNTER — Other Ambulatory Visit: Payer: Medicare Other

## 2016-09-02 ENCOUNTER — Ambulatory Visit (INDEPENDENT_AMBULATORY_CARE_PROVIDER_SITE_OTHER): Payer: Medicare Other | Admitting: Internal Medicine

## 2016-09-02 VITALS — BP 122/64 | HR 65 | Ht 68.0 in | Wt 176.0 lb

## 2016-09-02 DIAGNOSIS — I442 Atrioventricular block, complete: Secondary | ICD-10-CM

## 2016-09-02 DIAGNOSIS — Z950 Presence of cardiac pacemaker: Secondary | ICD-10-CM | POA: Diagnosis not present

## 2016-09-02 DIAGNOSIS — R0609 Other forms of dyspnea: Secondary | ICD-10-CM

## 2016-09-02 NOTE — Patient Instructions (Signed)
Medication Instructions: - Your physician recommends that you continue on your current medications as directed. Please refer to the Current Medication list given to you today.  Labwork: - none ordered  Procedures/Testing: - Your physician has requested that you have an echocardiogram. Echocardiography is a painless test that uses sound waves to create images of your heart. It provides your doctor with information about the size and shape of your heart and how well your heart's chambers and valves are working. This procedure takes approximately one hour. There are no restrictions for this procedure.  Follow-Up: - Remote monitoring is used to monitor your Pacemaker of ICD from home. This monitoring reduces the number of office visits required to check your device to one time per year. It allows [place] to keep an eye on the functioning of your device to ensure it is working properly. You are scheduled for a device check from home on 12/02/16. You may send your transmission at any time that day. If you have a wireless device, the transmission will be sent automatically. After your physician reviews your transmission, you will receive a postcard with your next transmission date.  - Your physician wants you to follow-up in: 1 year with Dr. Caryl Comes. You will receive a reminder letter in the mail two months in advance. If you don't receive a letter, please call our office to schedule the follow-up appointment.   Any Additional Special Instructions Will Be Listed Below (If Applicable).     If you need a refill on your cardiac medications before your next appointment, please call your pharmacy.

## 2016-09-02 NOTE — Progress Notes (Signed)
Patient Care Team: Tower, Wynelle Fanny, MD as PCP - General Deboraha Sprang, MD as Consulting Physician (Cardiology) Lorelee Cover., MD as Referring Physician (Ophthalmology) Tower, Wynelle Fanny, MD as Consulting Physician (Family Medicine) Bary Castilla Forest Gleason, MD (General Surgery)   HPI  [employer] D[place] is a 66 y.o. female Seen by both Drs. Allred and Lovena Le in the past who developed complete heart block and underwent pacing fall 2013.  Catheterization reportedly demonstrated no coronary artery disease  Echocardiogram 9/13 demonstrated normal left ventricular ; this was repeated 9/14 with normal LV function  At her last office visit we reprogrammed rate response. She had been much better since then   More recently she has been struggling with exercise intolerance-- no chest pain or PND or edema No arrhythmia   Her daughter a year ago developed cancer. Side effects from chemotherapy at left her bedbound for the last year She died 2022-04-07   devestating  Pt herself tolerated her cancer and XRT     Past Medical History:  Diagnosis Date  . Allergic rhinitis   . Arthritis   . Asthma    as a child, mild now  . Cancer [facility])    breast  . Colon polyps    colonoscopy 7/08, tubular adenoma  . Complete heart block ([place]) 8/13   s/p PPM implanted in [facility]  . COPD (chronic obstructive pulmonary disease) ([place])   . Myocardial infarction ([place]) 2011  . Pacemaker    2011  . Seizure disorder ([place])   . Seizures ([place])    first one was when she was 66 years old   . Small bowel obstruction ([place])    1988 and 2002  . Tobacco abuse     Past Surgical History:  Procedure Laterality Date  . ABDOMINAL HYSTERECTOMY    . BREAST BIOPSY Right 11/2005   benign inflammatory changes, mass due to underwire bra  . BREAST BIOPSY Left 01/02/2016   columnar cell changes without atypical hyperplasia.  Marland Kitchen BREAST LUMPECTOMY WITH SENTINEL LYMPH NODE BIOPSY Right 01/02/2016   Procedure: BREAST LUMPECTOMY  WITH SENTINEL LYMPH NODE BX;  Surgeon: [name], MD;  Location: [facility] ORS;  Service: General;  Laterality: Right;  . CATARACT EXTRACTION Bilateral   . COLONOSCOPY  10/2015   Dr Ardis Hughs  . EXPLORATORY LAPAROTOMY  01/25/2001   Exploratory laparotomy, lysis of adhesions, identification of internal hernia secondary to omental adhesion. Prolonged postoperative ileus.  Marland Kitchen gyn surgery  1993   hysterectomy- form endometriosis  . LAPAROSCOPY    . PACEMAKER INSERTION  10/24/11   [employer] Advantio dual chamber PPM implanted by Dr Bunnie Philips at [facility] in [facility]    Current Outpatient Prescriptions  Medication Sig Dispense Refill  . acetaminophen (TYLENOL) 500 MG tablet Take 500 mg by mouth every 6 (six) hours as needed.    . ALPRAZolam (XANAX) 0.5 MG tablet Take 1 tablet (0.5 mg total) by mouth 2 (two) times daily as needed for anxiety. 30 tablet 0  . anastrozole (ARIMIDEX) 1 MG tablet Take 1 tablet (1 mg total) by mouth daily. 30 tablet 0  . Calcium-Vitamin D-Vitamin K 301-601-09 MG-UNT-MCG TABS Take 2 tablets by mouth daily.    . Diphenhydramine-APAP 25-500 MG TABS Take 1 tablet by mouth at bedtime as needed.    Marland Kitchen FLUoxetine (PROZAC) 20 MG tablet Take 1 tablet (20 mg total) by mouth daily. 30 tablet 11  . furosemide (LASIX) 20 MG  tablet TAKE ONE TABLET BY MOUTH ONCE DAILY AS NEEDED (Patient taking differently: TAKE ONE TABLET BY MOUTH ONCE DAILY AS NEEDED FOR FLUID RETENTION/SWELLING) 30 tablet 3  . tretinoin (RETIN-A) 0.1 % cream Apply topically at bedtime. As directed for facial wrinkles (Patient taking differently: Apply 1 application topically 3 (three) times a week. ) 20 g 3  . zolpidem (AMBIEN) 10 MG tablet TAKE ONE-HALF TO ONE TABLET BY MOUTH AT BEDTIME AS NEEDED FOR SLEEP 30 tablet 5   No current facility-administered medications for this visit.     Allergies  Allergen Reactions  . Codeine Nausea And Vomiting    REACTION: nausea  . Morphine And  Related Nausea Only    Nausea     Review of Systems negative except from HPI and PMH  Physical Exam BP 122/64 (BP Location: Left Arm, Patient Position: Sitting, Cuff Size: Normal)   Pulse 65   Ht 5\' 8"  (1.727 m)   Wt 176 lb (79.8 kg)   BMI 26.76 kg/m  Well developed and nourished in no acute distress HENT normal Neck supple with JVP-flat Carotids brisk and full without bruits Clear Regular rate and rhythm, no murmurs or gallops Abd-soft with active BS without hepatomegaly No Clubbing cyanosis edema Skin-warm and dry A & Oriented  Grossly normal sensory and motor function  ECG  P-synchronous/ AV  pacing   Assessment and  Plan  High-grade heart block  Pacemaker-[employer] The patient's device was interrogated.  The information was reviewed. No changes were made in the programming.    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

## 2016-09-04 LAB — CUP PACEART INCLINIC DEVICE CHECK
Brady Statistic RA Percent Paced: 10 %
Brady Statistic RV Percent Paced: 100 %
Implantable Lead Implant Date: 20130830
Implantable Lead Location: 753860
Implantable Lead Model: 4456
Implantable Lead Model: 4479
Implantable Lead Serial Number: 473325
Lead Channel Impedance Value: 545 Ohm
Lead Channel Pacing Threshold Amplitude: 0.4 V
Lead Channel Pacing Threshold Amplitude: 0.5 V
Lead Channel Pacing Threshold Pulse Width: 0.5 ms
Lead Channel Pacing Threshold Pulse Width: 0.5 ms
Lead Channel Sensing Intrinsic Amplitude: 5.2 mV
Lead Channel Setting Pacing Amplitude: 2.4 V
Lead Channel Setting Pacing Pulse Width: 0.5 ms
MDC IDC LEAD IMPLANT DT: 20130830
MDC IDC LEAD LOCATION: 753859
MDC IDC LEAD SERIAL: 523784
MDC IDC MSMT LEADCHNL RV IMPEDANCE VALUE: 493 Ohm
MDC IDC PG IMPLANT DT: 20130830
MDC IDC PG SERIAL: 134132
MDC IDC SESS DTM: 20180710040000
MDC IDC SET LEADCHNL RA PACING AMPLITUDE: 2 V
MDC IDC SET LEADCHNL RV SENSING SENSITIVITY: 2.5 mV

## 2016-09-09 ENCOUNTER — Ambulatory Visit: Payer: Medicare Other | Admitting: Family Medicine

## 2016-09-11 ENCOUNTER — Ambulatory Visit: Payer: Medicare Other | Admitting: Radiation Oncology

## 2016-09-15 ENCOUNTER — Other Ambulatory Visit: Payer: Self-pay

## 2016-09-15 ENCOUNTER — Encounter: Payer: Self-pay | Admitting: General Surgery

## 2016-09-15 DIAGNOSIS — Z170 Estrogen receptor positive status [ER+]: Principal | ICD-10-CM

## 2016-09-15 DIAGNOSIS — C50411 Malignant neoplasm of upper-outer quadrant of right female breast: Secondary | ICD-10-CM

## 2016-09-15 DIAGNOSIS — M7050 Other bursitis of knee, unspecified knee: Secondary | ICD-10-CM | POA: Diagnosis not present

## 2016-09-16 ENCOUNTER — Ambulatory Visit (INDEPENDENT_AMBULATORY_CARE_PROVIDER_SITE_OTHER): Payer: Medicare Other | Admitting: General Surgery

## 2016-09-16 ENCOUNTER — Encounter: Payer: Self-pay | Admitting: General Surgery

## 2016-09-16 VITALS — BP 112/60 | HR 70 | Resp 12 | Ht 68.0 in | Wt 179.0 lb

## 2016-09-16 DIAGNOSIS — C50411 Malignant neoplasm of upper-outer quadrant of right female breast: Secondary | ICD-10-CM | POA: Diagnosis not present

## 2016-09-16 DIAGNOSIS — L732 Hidradenitis suppurativa: Secondary | ICD-10-CM | POA: Diagnosis not present

## 2016-09-16 DIAGNOSIS — Z170 Estrogen receptor positive status [ER+]: Secondary | ICD-10-CM | POA: Diagnosis not present

## 2016-09-16 MED ORDER — DOXYCYCLINE MONOHYDRATE 100 MG PO CAPS
100.0000 mg | ORAL_CAPSULE | Freq: Two times a day (BID) | ORAL | 0 refills | Status: DC
Start: 2016-09-16 — End: 2016-09-30

## 2016-09-16 MED ORDER — ANASTROZOLE 1 MG PO TABS: 1.0000 mg | ORAL_TABLET | Freq: Every day | ORAL | 3 refills | Status: DC

## 2016-09-16 NOTE — Progress Notes (Signed)
Patient ID: Diana Cook, female   DOB: January 12, 1951, 66 y.o.   MRN: [id number]  Chief Complaint  Patient presents with  . Follow-up    HPI Diana Cook is a 66 y.o. female here today to discuss Anastrozole.  she has been taking the Anastrozole for one month. She has had  Hidradenitis problems in the past. Occasionally hot flashes.   The patient has tolerated the change from Femara 2 anastrozole very well. Minimal hot flashes. Appetite is about baseline. Patient states she noticed some bumps under her arms and in the inguinal area. This started about two weeks ago, The patient had previously had trouble with hidradenitis in both sites without a recent flare in several years. Had seen Josue Hector, M.D. in the past.   HPI  Past Medical History:  Diagnosis Date  . Allergic rhinitis   . Arthritis   . Asthma    as a child, mild now  . Cancer [facility])    breast  . Colon polyps    colonoscopy 7/08, tubular adenoma  . Complete heart block ([place]) 8/13   s/p PPM implanted in [facility]  . COPD (chronic obstructive pulmonary disease) ([place])   . Myocardial infarction ([place]) 2011  . Pacemaker    2011  . Seizure disorder ([place])   . Seizures ([place])    first one was when she was 66 years old   . Small bowel obstruction ([place])    1988 and 2002  . Tobacco abuse     Past Surgical History:  Procedure Laterality Date  . ABDOMINAL HYSTERECTOMY    . BREAST BIOPSY Right 11/2005   benign inflammatory changes, mass due to underwire bra  . BREAST BIOPSY Left 01/02/2016   columnar cell changes without atypical hyperplasia.  Marland Kitchen BREAST LUMPECTOMY WITH SENTINEL LYMPH NODE BIOPSY Right 01/02/2016   Procedure: BREAST LUMPECTOMY WITH SENTINEL LYMPH NODE BX;  Surgeon: [name], MD;  Location: [facility] ORS;  Service: General;  Laterality: Right;  . CATARACT EXTRACTION Bilateral   . COLONOSCOPY  10/2015   Dr Ardis Hughs  . EXPLORATORY LAPAROTOMY  01/25/2001   Exploratory laparotomy, lysis of adhesions,  identification of internal hernia secondary to omental adhesion. Prolonged postoperative ileus.  Marland Kitchen gyn surgery  1993   hysterectomy- form endometriosis  . LAPAROSCOPY    . PACEMAKER INSERTION  10/24/11   [employer] Advantio dual chamber PPM implanted by Dr Bunnie Philips at [facility] in [place]    Family History  Problem Relation Age of Onset  . Stroke Mother   . Heart disease Mother        MI  . Dementia Mother   . Coronary artery disease Father   . Parkinsonism Father   . Cancer - Cervical Daughter 51       died 30-Mar-2022  . Colon cancer Neg Hx   . Breast cancer Neg Hx     Social History Social History  Substance Use Topics  . Smoking status: Former Smoker    Packs/day: 0.40    Years: 40.00    Types: Cigarettes    Quit date: 11/11/2010  . Smokeless tobacco: Never Used  . Alcohol use 4.2 oz/week    7 Glasses of wine per week     Comment: wine (pt said it's very occ)    Allergies  Allergen Reactions  . Codeine Nausea And Vomiting    REACTION: nausea  . Morphine And Related Nausea Only    Nausea  Current Outpatient Prescriptions  Medication Sig Dispense Refill  . acetaminophen (TYLENOL) 500 MG tablet Take 500 mg by mouth every 6 (six) hours as needed.    . ALPRAZolam (XANAX) 0.5 MG tablet Take 1 tablet (0.5 mg total) by mouth 2 (two) times daily as needed for anxiety. 30 tablet 0  . anastrozole (ARIMIDEX) 1 MG tablet Take 1 tablet (1 mg total) by mouth daily. 90 tablet 3  . Calcium-Vitamin D-Vitamin K 332-951-88 MG-UNT-MCG TABS Take 2 tablets by mouth daily.    . Diphenhydramine-APAP 25-500 MG TABS Take 1 tablet by mouth at bedtime as needed.    Marland Kitchen FLUoxetine (PROZAC) 20 MG tablet Take 1 tablet (20 mg total) by mouth daily. 30 tablet 11  . furosemide (LASIX) 20 MG tablet TAKE ONE TABLET BY MOUTH ONCE DAILY AS NEEDED (Patient taking differently: TAKE ONE TABLET BY MOUTH ONCE DAILY AS NEEDED FOR FLUID RETENTION/SWELLING) 30 tablet 3  . predniSONE  (STERAPRED UNI-PAK 21 TAB) 10 MG (21) TBPK tablet     . tretinoin (RETIN-A) 0.1 % cream Apply topically at bedtime. As directed for facial wrinkles (Patient taking differently: Apply 1 application topically 3 (three) times a week. ) 20 g 3  . zolpidem (AMBIEN) 10 MG tablet TAKE ONE-HALF TO ONE TABLET BY MOUTH AT BEDTIME AS NEEDED FOR SLEEP 30 tablet 5  . doxycycline (MONODOX) 100 MG capsule Take 1 capsule (100 mg total) by mouth 2 (two) times daily. 28 capsule 0   No current facility-administered medications for this visit.     Review of Systems Review of Systems  Blood pressure 112/60, pulse 70, resp. rate 12, height 5\' 8"  (1.727 m), weight 179 lb (81.2 kg).  Physical Exam Physical Exam  Constitutional: She is oriented to person, place, and time. She appears well-developed and well-nourished.  Pulmonary/Chest:    Abdominal:    Neurological: She is alert and oriented to person, place, and time.  Skin: Skin is warm and dry.   1 cm right axillary 4 mm left axillary Both inguinal areas of hidradenitis  Data Reviewed Skin eruptions is not frequent side effect of anastrozole.  Assessment    Reactivation of hidradenitis   Plan    Patient will be placed on doxycycline 100 mg by mouth twice a day. Advised to use adequate protection from the son with possibility of photosensitivity with doxycycline.   We will plan for follow-up in fall 2018 with diagnostic mammograms in regards to her right breast cancer.     Return as scheduled. The patient is aware to call back for any questions or concerns.   HPI, Physical Exam, Assessment and Plan have been scribed under the direction and in the presence of Hervey Ard, MD.  Gaspar Cola, CMA  I have completed the exam and reviewed the above documentation for accuracy and completeness.  I agree with the above.  Haematologist has been used and any errors in dictation or transcription are unintentional.  Hervey Ard, M.D.,  F.A.C.S.   [name] 09/16/2016, 1:43 PM

## 2016-09-16 NOTE — Patient Instructions (Signed)
    Return as scheduled. The patient is aware to call back for any questions or concerns.

## 2016-09-17 ENCOUNTER — Ambulatory Visit: Payer: Medicare Other | Admitting: Family Medicine

## 2016-09-17 ENCOUNTER — Ambulatory Visit
Admission: RE | Admit: 2016-09-17 | Discharge: 2016-09-17 | Disposition: A | Discharge status: Home / Self Care | Payer: Medicare Other | Source: Ambulatory Visit | Attending: Radiation Oncology | Admitting: Radiation Oncology

## 2016-09-17 ENCOUNTER — Encounter: Payer: Self-pay | Admitting: Radiation Oncology

## 2016-09-17 VITALS — BP 101/56 | HR 73 | Temp 97.8°F | Wt 179.7 lb

## 2016-09-17 DIAGNOSIS — Z79811 Long term (current) use of aromatase inhibitors: Secondary | ICD-10-CM | POA: Diagnosis not present

## 2016-09-17 DIAGNOSIS — C50411 Malignant neoplasm of upper-outer quadrant of right female breast: Secondary | ICD-10-CM | POA: Diagnosis not present

## 2016-09-17 DIAGNOSIS — Z170 Estrogen receptor positive status [ER+]: Secondary | ICD-10-CM | POA: Diagnosis not present

## 2016-09-17 DIAGNOSIS — Z923 Personal history of irradiation: Secondary | ICD-10-CM | POA: Insufficient documentation

## 2016-09-17 NOTE — Progress Notes (Signed)
Radiation Oncology Follow up Note  Name: Diana Cook   Date:   09/17/2016 MRN:  [id number] DOB: February 05, 1951    This 66 y.o. 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  REFERRING PROVIDER: Tower, Wynelle Fanny, MD  HPI: Patient is a 66 year old female now out 6 months having completed whole breast radiation to her right breast for ER/PR positive invasive mammary carcinoma status post wide local excision and sentinel node biopsy. She seen today in routine follow-up is doing well. She specifically denies breast tenderness cough or bone pain.. She is currently on arimadex time that well without side effect. Follow-up mammograms have been ordered by her surgeon.  COMPLICATIONS OF TREATMENT: none  FOLLOW UP COMPLIANCE: keeps appointments   PHYSICAL EXAM:  BP (!) 101/56   Pulse 73   Temp 97.8 F (36.6 C)   Wt 179 lb 10.8 oz (81.5 kg)   BMI 27.32 kg/m  Lungs are clear to A&P cardiac examination essentially unremarkable with regular rate and rhythm. No dominant mass or nodularity is noted in either breast in 2 positions examined. Incision is well-healed. No axillary or supraclavicular adenopathy is appreciated. Cosmetic result is excellent. Well-developed well-nourished patient in NAD. HEENT reveals PERLA, EOMI, discs not visualized.  Oral cavity is clear. No oral mucosal lesions are identified. Neck is clear without evidence of cervical or supraclavicular adenopathy. Lungs are clear to A&P. Cardiac examination is essentially unremarkable with regular rate and rhythm without murmur rub or thrill. Abdomen is benign with no organomegaly or masses noted. Motor sensory and DTR levels are equal and symmetric in the upper and lower extremities. Cranial nerves II through XII are grossly intact. Proprioception is intact. No peripheral adenopathy or edema is identified. No motor or sensory  levels are noted. Crude visual fields are within normal range.  RADIOLOGY RESULTS: No current films for review  PLAN: Present time she continues to do well with no evidence of disease. I'm please were overall progress. I've asked to see her back in 6 months for follow-up and will start once your follow-up visits. She continues on arimadex without side effect. Patient knows to call with any concerns.  I would like to take this opportunity to thank you for allowing me to participate in the care of your patient.Armstead Peaks., MD

## 2016-09-22 ENCOUNTER — Ambulatory Visit: Payer: Medicare Other | Admitting: Family Medicine

## 2016-09-23 NOTE — Progress Notes (Signed)
Pre visit review using our clinic review tool, if applicable. No additional management support is needed unless otherwise documented below in the visit note. 

## 2016-09-24 ENCOUNTER — Ambulatory Visit: Payer: Self-pay | Admitting: General Surgery

## 2016-09-30 ENCOUNTER — Other Ambulatory Visit: Payer: Self-pay

## 2016-09-30 ENCOUNTER — Emergency Department: Payer: Medicare Other

## 2016-09-30 ENCOUNTER — Ambulatory Visit (INDEPENDENT_AMBULATORY_CARE_PROVIDER_SITE_OTHER): Payer: Medicare Other

## 2016-09-30 ENCOUNTER — Observation Stay
Admission: EM | Admit: 2016-09-30 | Discharge: 2016-10-01 | Disposition: A | Discharge status: Home / Self Care | Payer: Medicare Other | Attending: Internal Medicine | Admitting: Internal Medicine

## 2016-09-30 ENCOUNTER — Encounter: Payer: Self-pay | Admitting: Emergency Medicine

## 2016-09-30 VITALS — BP 102/68 | HR 68 | Temp 98.0°F | Ht 68.0 in | Wt 178.0 lb

## 2016-09-30 DIAGNOSIS — R7309 Other abnormal glucose: Secondary | ICD-10-CM

## 2016-09-30 DIAGNOSIS — Z853 Personal history of malignant neoplasm of breast: Secondary | ICD-10-CM | POA: Diagnosis not present

## 2016-09-30 DIAGNOSIS — Z0000 Encounter for general adult medical examination without abnormal findings: Secondary | ICD-10-CM

## 2016-09-30 DIAGNOSIS — R4182 Altered mental status, unspecified: Secondary | ICD-10-CM | POA: Diagnosis not present

## 2016-09-30 DIAGNOSIS — Z79899 Other long term (current) drug therapy: Secondary | ICD-10-CM | POA: Diagnosis not present

## 2016-09-30 DIAGNOSIS — J449 Chronic obstructive pulmonary disease, unspecified: Secondary | ICD-10-CM | POA: Diagnosis not present

## 2016-09-30 DIAGNOSIS — Z1159 Encounter for screening for other viral diseases: Secondary | ICD-10-CM

## 2016-09-30 DIAGNOSIS — R569 Unspecified convulsions: Secondary | ICD-10-CM | POA: Diagnosis not present

## 2016-09-30 DIAGNOSIS — R0902 Hypoxemia: Secondary | ICD-10-CM | POA: Diagnosis not present

## 2016-09-30 DIAGNOSIS — J9601 Acute respiratory failure with hypoxia: Secondary | ICD-10-CM

## 2016-09-30 DIAGNOSIS — J690 Pneumonitis due to inhalation of food and vomit: Secondary | ICD-10-CM | POA: Diagnosis not present

## 2016-09-30 DIAGNOSIS — R404 Transient alteration of awareness: Secondary | ICD-10-CM | POA: Diagnosis not present

## 2016-09-30 DIAGNOSIS — Z950 Presence of cardiac pacemaker: Secondary | ICD-10-CM | POA: Diagnosis not present

## 2016-09-30 DIAGNOSIS — Z87891 Personal history of nicotine dependence: Secondary | ICD-10-CM | POA: Insufficient documentation

## 2016-09-30 DIAGNOSIS — G40909 Epilepsy, unspecified, not intractable, without status epilepticus: Principal | ICD-10-CM | POA: Insufficient documentation

## 2016-09-30 LAB — CBC WITH DIFFERENTIAL/PLATELET
BASOS ABS: 0 10*3/uL (ref 0–0.1)
BASOS PCT: 1 % (ref 0.0–3.0)
Basophils Absolute: 0.1 10*3/uL (ref 0.0–0.1)
Basophils Relative: 0 %
EOS PCT: 0 %
Eosinophils Absolute: 0.1 10*3/uL (ref 0.0–0.7)
Eosinophils Absolute: 0 10*3/uL (ref 0–0.7)
Eosinophils Relative: 2 % (ref 0.0–5.0)
HCT: 41.8 % (ref 36.0–46.0)
HCT: 40.8 % (ref 35.0–47.0)
HEMOGLOBIN: 13.6 g/dL (ref 12.0–16.0)
Hemoglobin: 13.8 g/dL (ref 12.0–15.0)
LYMPHS ABS: 1.1 10*3/uL (ref 0.7–4.0)
LYMPHS PCT: 7 %
Lymphocytes Relative: 15.2 % (ref 12.0–46.0)
Lymphs Abs: 0.8 10*3/uL — ABNORMAL LOW (ref 1.0–3.6)
MCH: 30 pg (ref 26.0–34.0)
MCHC: 33 g/dL (ref 30.0–36.0)
MCHC: 33.4 g/dL (ref 32.0–36.0)
MCV: 90.5 fl (ref 78.0–100.0)
MCV: 89.7 fL (ref 80.0–100.0)
MONO ABS: 0.6 10*3/uL (ref 0.1–1.0)
Monocytes Absolute: 0.4 10*3/uL (ref 0.2–0.9)
Monocytes Relative: 8.7 % (ref 3.0–12.0)
Monocytes Relative: 4 %
NEUTROS ABS: 5.1 10*3/uL (ref 1.4–7.7)
NEUTROS ABS: 9 10*3/uL — AB (ref 1.4–6.5)
NEUTROS PCT: 73.1 % (ref 43.0–77.0)
NEUTROS PCT: 89 %
PLATELETS: 270 10*3/uL (ref 150.0–400.0)
PLATELETS: 254 10*3/uL (ref 150–440)
RBC: 4.62 Mil/uL (ref 3.87–5.11)
RBC: 4.55 MIL/uL (ref 3.80–5.20)
RDW: 13.4 % (ref 11.5–15.5)
RDW: 13.3 % (ref 11.5–14.5)
WBC: 6.9 10*3/uL (ref 4.0–10.5)
WBC: 10.2 10*3/uL (ref 3.6–11.0)

## 2016-09-30 LAB — COMPREHENSIVE METABOLIC PANEL
ALK PHOS: 77 U/L (ref 39–117)
ALT: 17 U/L (ref 0–35)
ALT: 18 U/L (ref 14–54)
ANION GAP: 13 (ref 5–15)
AST: 20 U/L (ref 0–37)
AST: 37 U/L (ref 15–41)
Albumin: 4.3 g/dL (ref 3.5–5.2)
Albumin: 3.9 g/dL (ref 3.5–5.0)
Alkaline Phosphatase: 76 U/L (ref 38–126)
BILIRUBIN TOTAL: 1.1 mg/dL (ref 0.3–1.2)
BUN: 23 mg/dL (ref 6–23)
BUN: 25 mg/dL — AB (ref 6–20)
CO2: 26 mEq/L (ref 19–32)
CO2: 18 mmol/L — ABNORMAL LOW (ref 22–32)
CREATININE: 0.92 mg/dL (ref 0.40–1.20)
Calcium: 10.3 mg/dL (ref 8.4–10.5)
Calcium: 9.6 mg/dL (ref 8.9–10.3)
Chloride: 101 mEq/L (ref 96–112)
Chloride: 104 mmol/L (ref 101–111)
Creatinine, Ser: 0.95 mg/dL (ref 0.44–1.00)
GFR: 64.49 mL/min (ref >60.00)
GLUCOSE: 94 mg/dL (ref 70–99)
Glucose, Bld: 146 mg/dL — ABNORMAL HIGH (ref 65–99)
POTASSIUM: 4.2 mmol/L (ref 3.5–5.1)
Potassium: 4.6 mEq/L (ref 3.5–5.1)
SODIUM: 134 meq/L — AB (ref 135–145)
Sodium: 135 mmol/L (ref 135–145)
TOTAL PROTEIN: 7.7 g/dL (ref 6.0–8.3)
TOTAL PROTEIN: 7.2 g/dL (ref 6.5–8.1)
Total Bilirubin: 0.8 mg/dL (ref 0.2–1.2)

## 2016-09-30 LAB — TSH: TSH: 1.18 u[IU]/mL (ref 0.35–4.50)

## 2016-09-30 LAB — LIPID PANEL
CHOLESTEROL: 190 mg/dL (ref 0–200)
HDL: 67.5 mg/dL (ref >39.00)
LDL Cholesterol: 108 mg/dL — ABNORMAL HIGH (ref 0–99)
NonHDL: 122.13
TRIGLYCERIDES: 69 mg/dL (ref 0.0–149.0)
Total CHOL/HDL Ratio: 3
VLDL: 13.8 mg/dL (ref 0.0–40.0)

## 2016-09-30 LAB — HEMOGLOBIN A1C: HEMOGLOBIN A1C: 5.9 % (ref 4.6–6.5)

## 2016-09-30 MED ORDER — SODIUM CHLORIDE 0.9 % IV SOLN: 1500.0000 mg | Freq: Once | INTRAVENOUS | Status: DC

## 2016-09-30 MED ORDER — SODIUM CHLORIDE 0.9 % IV SOLN
1500.0000 mg | Freq: Once | INTRAVENOUS | Status: AC
  Administered 2016-09-30: 1500 mg via INTRAVENOUS
  Filled 2016-09-30: qty 30

## 2016-09-30 MED ORDER — ZOLPIDEM TARTRATE 10 MG PO TABS: ORAL_TABLET | ORAL | 5 refills | Status: DC

## 2016-09-30 MED ORDER — PHENYTOIN SODIUM EXTENDED 100 MG PO CAPS: 300.0000 mg | ORAL_CAPSULE | Freq: Every day | ORAL | 1 refills | Status: DC

## 2016-09-30 NOTE — Patient Instructions (Signed)
[name] , Thank you for taking time to come for your Medicare Wellness Visit. I appreciate your ongoing commitment to your health goals. Please review the following plan we discussed and let me know if I can assist you in the future.   These are the goals we discussed: Goals    . Increase physical activity          When schedule permits, I will resume walking for at least 45 min 3-5 days per week.        This is a list of the screening recommended for you and due dates:  Health Maintenance  Topic Date Due  . Flu Shot  05/24/2017*  . Mammogram  12/03/2016  . Tetanus Vaccine  06/13/2017  . Colon Cancer Screening  11/06/2018  . DEXA scan (bone density measurement)  Completed  .  Hepatitis C: One time screening is recommended by Center for Disease Control  (CDC) for  adults born from [birth year] through 1967.   Completed  . Pneumonia vaccines  Completed  *Topic was postponed. The date shown is not the original due date.   Preventive Care for Adults  A healthy lifestyle and preventive care can promote health and wellness. Preventive health guidelines for adults include the following key practices.  . A routine yearly physical is a good way to check with your health care provider about your health and preventive screening. It is a chance to share any concerns and updates on your health and to receive a thorough exam.  . Visit your dentist for a routine exam and preventive care every 6 months. Brush your teeth twice a day and floss once a day. Good oral hygiene prevents tooth decay and gum disease.  . The frequency of eye exams is based on your age, health, family medical history, use  of contact lenses, and other factors. Follow your health care provider's ecommendations for frequency of eye exams.  . Eat a healthy diet. Foods like vegetables, fruits, whole grains, low-fat dairy products, and lean protein foods contain the nutrients you need without too many calories. Decrease your intake of  foods high in solid fats, added sugars, and salt. Eat the right amount of calories for you. Get information about a proper diet from your health care provider, if necessary.  . Regular physical exercise is one of the most important things you can do for your health. Most adults should get at least 150 minutes of moderate-intensity exercise (any activity that increases your heart rate and causes you to sweat) each week. In addition, most adults need muscle-strengthening exercises on 2 or more days a week.  Silver Sneakers may be a benefit available to you. To determine eligibility, you may visit the website: www.silversneakers.com or contact program at [phone] Mon-Fri between 8AM-8PM.   . Maintain a healthy weight. The body mass index (BMI) is a screening tool to identify possible weight problems. It provides an estimate of body fat based on height and weight. Your health care provider can find your BMI and can help you achieve or maintain a healthy weight.   For adults 20 years and older: ? A BMI below 18.5 is considered underweight. ? A BMI of 18.5 to 24.9 is normal. ? A BMI of 25 to 29.9 is considered overweight. ? A BMI of 30 and above is considered obese.   . Maintain normal blood lipids and cholesterol levels by exercising and minimizing your intake of saturated fat. Eat a balanced diet with plenty of  fruit and vegetables. Blood tests for lipids and cholesterol should begin at age 88 and be repeated every 5 years. If your lipid or cholesterol levels are high, you are over 50, or you are at high risk for heart disease, you may need your cholesterol levels checked more frequently. Ongoing high lipid and cholesterol levels should be treated with medicines if diet and exercise are not working.  . If you smoke, find out from your health care provider how to quit. If you do not use tobacco, please do not start.  . If you choose to drink alcohol, please do not consume more than 2 drinks per  day. One drink is considered to be 12 ounces (355 mL) of beer, 5 ounces (148 mL) of wine, or 1.5 ounces (44 mL) of liquor.  . If you are 35-31 years old, ask your health care provider if you should take aspirin to prevent strokes.  . Use sunscreen. Apply sunscreen liberally and repeatedly throughout the day. You should seek shade when your shadow is shorter than you. Protect yourself by wearing long sleeves, pants, a wide-brimmed hat, and sunglasses year round, whenever you are outdoors.  . Once a month, do a whole body skin exam, using a mirror to look at the skin on your back. Tell your health care provider of new moles, moles that have irregular borders, moles that are larger than a pencil eraser, or moles that have changed in shape or color.

## 2016-09-30 NOTE — ED Notes (Signed)
Per husband 35 years ago had 2 grand mal seizures in a day, placed her on dilantin and tegretol. Stopped about 10 years ago. Had breast cancer with surgery and chemo finished in january. Had intestinal blockage due to hysterectomy, with 2 emergency surgeries.

## 2016-09-30 NOTE — Progress Notes (Signed)
Subjective:   Diana Cook is a 66 y.o. female who presents for Medicare Annual (Subsequent) preventive examination.  Review of Systems:  N/A Cardiac Risk Factors include: advanced age (>42men, >61 women)     Objective:     Vitals: BP 102/68 (BP Location: Right Arm, Patient Position: Sitting, Cuff Size: Normal)   Pulse 68   Temp 98 F (36.7 C) (Oral)   Ht 5\' 8"  (1.727 m)   Wt 178 lb (80.7 kg)   SpO2 92%   BMI 27.06 kg/m   Body mass index is 27.06 kg/m.   Tobacco History  Smoking Status  . Former Smoker  . Packs/day: 0.40  . Years: 40.00  . Types: Cigarettes  . Quit date: 11/11/2010  Smokeless Tobacco  . Never Used     Counseling given: No   Past Medical History:  Diagnosis Date  . Allergic rhinitis   . Arthritis   . Asthma    as a child, mild now  . Cancer [facility])    breast  . Colon polyps    colonoscopy 7/08, tubular adenoma  . Complete heart block ([place]) 8/13   s/p PPM implanted in [facility]  . COPD (chronic obstructive pulmonary disease) ([place])   . Myocardial infarction ([place]) 2011  . Pacemaker    2011  . Seizure disorder ([place])   . Seizures ([place])    first one was when she was 66 years old   . Small bowel obstruction ([place])    1988 and 2002  . Tobacco abuse    Past Surgical History:  Procedure Laterality Date  . ABDOMINAL HYSTERECTOMY    . BREAST BIOPSY Right 11/2005   benign inflammatory changes, mass due to underwire bra  . BREAST BIOPSY Left 01/02/2016   columnar cell changes without atypical hyperplasia.  Marland Kitchen BREAST LUMPECTOMY WITH SENTINEL LYMPH NODE BIOPSY Right 01/02/2016   Procedure: BREAST LUMPECTOMY WITH SENTINEL LYMPH NODE BX;  Surgeon: [name], MD;  Location: [facility] ORS;  Service: General;  Laterality: Right;  . CATARACT EXTRACTION Bilateral   . COLONOSCOPY  10/2015   Dr Ardis Hughs  . EXPLORATORY LAPAROTOMY  01/25/2001   Exploratory laparotomy, lysis of adhesions, identification of internal hernia secondary to omental  adhesion. Prolonged postoperative ileus.  Marland Kitchen gyn surgery  1993   hysterectomy- form endometriosis  . LAPAROSCOPY    . PACEMAKER INSERTION  10/24/11   [employer] Advantio dual chamber PPM implanted by Dr Bunnie Philips at [facility] in [place]   Family History  Problem Relation Age of Onset  . Stroke Mother   . Heart disease Mother        MI  . Dementia Mother   . Coronary artery disease Father   . Parkinsonism Father   . Cancer - Cervical Daughter 36       died 04/11/2022  . Colon cancer Neg Hx   . Breast cancer Neg Hx    History  Sexual Activity  . Sexual activity: No    Outpatient Encounter Prescriptions as of 09/30/2016  Medication Sig  . acetaminophen (TYLENOL) 500 MG tablet Take 500 mg by mouth every 6 (six) hours as needed.  . ALPRAZolam (XANAX) 0.5 MG tablet Take 1 tablet (0.5 mg total) by mouth 2 (two) times daily as needed for anxiety.  Marland Kitchen anastrozole (ARIMIDEX) 1 MG tablet Take 1 tablet (1 mg total) by mouth daily.  . Calcium-Vitamin D-Vitamin K 259-563-87 MG-UNT-MCG TABS Take 2 tablets by mouth daily.  Marland Kitchen  FLUoxetine (PROZAC) 20 MG tablet Take 1 tablet (20 mg total) by mouth daily.  . furosemide (LASIX) 20 MG tablet TAKE ONE TABLET BY MOUTH ONCE DAILY AS NEEDED (Patient taking differently: TAKE ONE TABLET BY MOUTH ONCE DAILY AS NEEDED FOR FLUID RETENTION/SWELLING)  . zolpidem (AMBIEN) 10 MG tablet TAKE ONE-HALF TO ONE TABLET BY MOUTH AT BEDTIME AS NEEDED FOR SLEEP  . [DISCONTINUED] Diphenhydramine-APAP 25-500 MG TABS Take 1 tablet by mouth at bedtime as needed.  . [DISCONTINUED] doxycycline (MONODOX) 100 MG capsule Take 1 capsule (100 mg total) by mouth 2 (two) times daily.  . [DISCONTINUED] predniSONE (STERAPRED UNI-PAK 21 TAB) 10 MG (21) TBPK tablet   . [DISCONTINUED] tretinoin (RETIN-A) 0.1 % cream Apply topically at bedtime. As directed for facial wrinkles (Patient taking differently: Apply 1 application topically 3 (three) times a week. )   No  facility-administered encounter medications on file as of 09/30/2016.     Activities of Daily Living In your present state of health, do you have any difficulty performing the following activities: 09/30/2016 12/21/2015  Hearing? N N  Vision? Y N  Difficulty concentrating or making decisions? N N  Walking or climbing stairs? N N  Dressing or bathing? N N  Doing errands, shopping? N N  Preparing Food and eating ? N -  Using the Toilet? N -  In the past six months, have you accidently leaked urine? N -  Do you have problems with loss of bowel control? N -  Managing your Medications? N -  Managing your Finances? N -  Housekeeping or managing your Housekeeping? N -  Some recent data might be hidden    Patient Care Team: Tower, Wynelle Fanny, MD as PCP - General Deboraha Sprang, MD as Consulting Physician (Cardiology) Lorelee Cover., MD as Referring Physician (Ophthalmology) Tower, Wynelle Fanny, MD as Consulting Physician (Family Medicine) Bary Castilla Forest Gleason, MD (General Surgery)    Assessment:     Hearing Screening   125Hz  250Hz  500Hz  1000Hz  2000Hz  3000Hz  4000Hz  6000Hz  8000Hz   Right ear:   40 40 40  40    Left ear:   0 40 40  40      Visual Acuity Screening   Right eye Left eye Both eyes  Without correction: 20/50-1 20/50-1 20/40  With correction:       Exercise Activities and Dietary recommendations Current Exercise Habits: The patient does not participate in regular exercise at present, Exercise limited by: None identified  Goals    . Increase physical activity          When schedule permits, I will resume walking for at least 45 min 3-5 days per week.       Fall Risk Fall Risk  09/30/2016 07/26/2015 07/11/2014  Falls in the past year? No No No   Depression Screen PHQ 2/9 Scores 09/30/2016 07/26/2015 07/11/2014  PHQ - 2 Score 2 0 0  PHQ- 9 Score 4 - -     Cognitive Function MMSE - Mini Mental State Exam 09/30/2016 07/26/2015  Orientation to time 5 5  Orientation to Place 5 5    Registration 3 3  Attention/ Calculation 0 0  Recall 3 2  Recall-comments - pt was unable to recall 1 of 3 words  Language- name 2 objects 0 0  Language- repeat 1 1  Language- follow 3 step command 3 3  Language- read & follow direction 0 0  Write a sentence 0 0  Copy design 0 0  Total score 20 19       PLEASE NOTE: A Mini-Cog screen was completed. Maximum score is 20. A value of 0 denotes this part of [place] MMSE was not completed or the patient failed this part of the Mini-Cog screening.   Mini-Cog Screening Orientation to Time - Max 5 pts Orientation to Place - Max 5 pts Registration - Max 3 pts Recall - Max 3 pts Language Repeat - Max 1 pts Language Follow 3 Step Command - Max 3 pts   Immunization History  Administered Date(s) Administered  . Influenza Whole 02/24/2005  . Influenza,inj,Quad PF,36+ Mos 12/03/2012, 12/09/2013, 01/16/2015, 02/06/2016  . Pneumococcal Conjugate-13 07/11/2014  . Pneumococcal Polysaccharide-23 03/21/2005, 07/26/2015  . Td 01/09/1999, 06/14/2007  . Zoster 09/07/2015   Screening Tests Health Maintenance  Topic Date Due  . INFLUENZA VACCINE  05/24/2017 (Originally 09/24/2016)  . MAMMOGRAM  12/03/2016  . TETANUS/TDAP  06/13/2017  . COLONOSCOPY  11/06/2018  . DEXA SCAN  Completed  . Hepatitis C Screening  Completed  . PNA vac Low Risk Adult  Completed      Plan:     I have personally reviewed and addressed the Medicare Annual Wellness questionnaire and have noted the following in the patient's chart:  A. Medical and social history B. Use of alcohol, tobacco or illicit drugs  C. Current medications and supplements D. Functional ability and status E.  Nutritional status F.  Physical activity G. Advance directives H. List of other physicians I.  Hospitalizations, surgeries, and ER visits in previous 12 months J.  [place] to include hearing, vision, cognitive, depression L. Referrals and appointments - none  In  addition, I have reviewed and discussed with patient certain preventive protocols, quality metrics, and best practice recommendations. A written personalized care plan for preventive services as well as general preventive health recommendations were provided to patient.  See attached scanned questionnaire for additional information.   Signed,   Lindell Noe, MHA, BS, LPN Health Coach

## 2016-09-30 NOTE — Telephone Encounter (Signed)
Pt was in office today for AWV. Pt has requested a refill for Ambien. Please send medication to [facility]. Thank you.

## 2016-09-30 NOTE — Telephone Encounter (Signed)
Px written for call in   

## 2016-09-30 NOTE — ED Triage Notes (Signed)
Patient comes in from home via [facility] with seizures. Per EMS husband saw some focal seizure activity with her eyes around 4:30pm, EMS did come out but patient did not come to the hospital. Patient had another seizure and is now here. Per EMS patient was incontient of urine, c/o abdominal pain "i feel like I need to throw up." patient has hx of seizures 30 years ago. Patient also has a pacemaker and history of lung cancer. Patient saw PCP today and everything was fine,

## 2016-09-30 NOTE — ED Provider Notes (Addendum)
[facility] Emergency Department Provider Note   ____________________________________________   First MD Initiated Contact with Patient 09/30/16 2037     (approximate)  I have reviewed the triage vital signs and the nursing notes.   HISTORY  Chief Complaint Seizures    HPI Diana Cook is a 66 y.o. female patient and husband report that she had seizures about 35 years ago these were grand mal seizures. She has not had any seizures for 35 years. She is not on seizure medications and has not been on any for at least 10 years. She had breast cancer surgery with chemotherapy and finished in January. Today she had at least 2 seizures possibly more. Husband was noticed one started with the right eyelid rapid twitching and then progressed to left eyelid . She had one other episode when he was not in the room with her and when he came back she was altered mental status with slobber all over the pillow. At one point and a history of lung cancer but patient the husband and the son did not say that when I spoke with them. Patient was very nauseated and had complained to EMS of abdominal pain she is not nauseated and has no abdominal pain present. Patient also has a pacemaker.   Past Medical History:  Diagnosis Date  . Allergic rhinitis   . Arthritis   . Asthma    as a child, mild now  . Cancer [facility])    breast  . Colon polyps    colonoscopy 7/08, tubular adenoma  . Complete heart block ([place]) 8/13   s/p PPM implanted in [facility]  . COPD (chronic obstructive pulmonary disease) ([place])   . Myocardial infarction ([place]) 2011  . Pacemaker    2011  . Seizure disorder ([place])   . Seizures ([place])    first one was when she was 66 years old   . Small bowel obstruction ([place])    1988 and 2002  . Tobacco abuse     Patient Active Problem List   Diagnosis Date Noted  . Elevated glucose level 08/31/2016  . Grief reaction 04/11/2016  . Malignant neoplasm of upper-outer  quadrant of right breast in female, estrogen receptor positive ([place]) 12/11/2015  . Osteopenia 11/22/2015  . Estrogen deficiency 09/07/2015  . Bruising 09/07/2015  . Wrinkles 09/07/2015  . Ringworm of body 05/22/2015  . Post herpetic neuralgia 09/01/2014  . Encounter for Medicare annual wellness exam 07/11/2014  . Routine general medical examination at a health care facility 06/26/2013  . H/O small bowel obstruction 04/15/2013  . Dyspnea on exertion 02/10/2013  . Complete heart block ([place]) 11/03/2011  . [employer] 11/03/2011  . Lumbar spinal stenosis 09/02/2011  . POSTMENOPAUSAL STATUS 08/06/2009  . HIDRADENITIS SUPPURATIVA 06/26/2008  . HERNIATED DISC 12/14/2007  . BACK, LOWER, PAIN 12/02/2007  . HX, PERSONAL, COLONIC POLYPS 09/14/2006  . Former smoker 06/10/2006  . ASTHMA 06/10/2006  . H/O idiopathic seizure 06/10/2006    Past Surgical History:  Procedure Laterality Date  . ABDOMINAL HYSTERECTOMY    . BREAST BIOPSY Right 11/2005   benign inflammatory changes, mass due to underwire bra  . BREAST BIOPSY Left 01/02/2016   columnar cell changes without atypical hyperplasia.  Marland Kitchen BREAST LUMPECTOMY WITH SENTINEL LYMPH NODE BIOPSY Right 01/02/2016   Procedure: BREAST LUMPECTOMY WITH SENTINEL LYMPH NODE BX;  Surgeon: [name], MD;  Location: [facility] ORS;  Service: General;  Laterality: Right;  . CATARACT EXTRACTION Bilateral   .  COLONOSCOPY  10/2015   Dr Ardis Hughs  . EXPLORATORY LAPAROTOMY  01/25/2001   Exploratory laparotomy, lysis of adhesions, identification of internal hernia secondary to omental adhesion. Prolonged postoperative ileus.  Marland Kitchen gyn surgery  1993   hysterectomy- form endometriosis  . LAPAROSCOPY    . PACEMAKER INSERTION  10/24/11   [employer] Advantio dual chamber PPM implanted by Dr Bunnie Philips at [facility] in [place]    Prior to Admission medications   Medication Sig Start Date End Date Taking? Authorizing Provider    acetaminophen (TYLENOL) 500 MG tablet Take 500 mg by mouth every 6 (six) hours as needed.   Yes [provider]  ALPRAZolam (XANAX) 0.5 MG tablet Take 1 tablet (0.5 mg total) by mouth 2 (two) times daily as needed for anxiety. 03/18/16  Yes Tower, Wynelle Fanny, MD  anastrozole (ARIMIDEX) 1 MG tablet Take 1 tablet (1 mg total) by mouth daily. 09/16/16  Yes Byrnett, Forest Gleason, MD  Calcium-Vitamin D-Vitamin K [id number] MG-UNT-MCG TABS Take 2 tablets by mouth daily.   Yes [provider]  doxycycline (MONODOX) 100 MG capsule Take 1 capsule by mouth 2 (two) times daily. 09/16/16  Yes [provider]  FLUoxetine (PROZAC) 20 MG tablet Take 1 tablet (20 mg total) by mouth daily. 04/11/16  Yes Tower, Wynelle Fanny, MD  furosemide (LASIX) 20 MG tablet TAKE ONE TABLET BY MOUTH ONCE DAILY AS NEEDED Patient taking differently: TAKE ONE TABLET BY MOUTH ONCE DAILY AS NEEDED FOR FLUID RETENTION/SWELLING 06/13/15  Yes Deboraha Sprang, MD  zolpidem (AMBIEN) 10 MG tablet TAKE ONE-HALF TO ONE TABLET BY MOUTH AT BEDTIME AS NEEDED FOR SLEEP 09/30/16  Yes Tower, Wynelle Fanny, MD  phenytoin (DILANTIN) 100 MG ER capsule Take 3 capsules (300 mg total) by mouth at bedtime. 09/30/16   Nena Polio, MD    Allergies Codeine and Morphine and related  Family History  Problem Relation Age of Onset  . Stroke Mother   . Heart disease Mother        MI  . Dementia Mother   . Coronary artery disease Father   . Parkinsonism Father   . Cancer - Cervical Daughter 35       died 2022/04/06  . Colon cancer Neg Hx   . Breast cancer Neg Hx     Social History Social History  Substance Use Topics  . Smoking status: Former Smoker    Packs/day: 0.40    Years: 40.00    Types: Cigarettes    Quit date: 11/11/2010  . Smokeless tobacco: Never Used  . Alcohol use 2.4 oz/week    4 Glasses of wine per week     Comment: wine (pt said it's very occ)    Review of Systems At present Constitutional: No fever/chills Eyes: No visual  changes. ENT: No sore throat. Cardiovascular: Denies chest pain. Respiratory: Denies shortness of breath. Gastrointestinal: No abdominal pain.  No nausea, no vomiting.  No diarrhea.  No constipation. Genitourinary: Negative for dysuria. Musculoskeletal: Negative for back pain. Skin: Negative for rash. Neurological: Negative for headaches, focal weakness or numbness.   ____________________________________________   PHYSICAL EXAM:  VITAL SIGNS: ED Triage Vitals [09/30/16 2027]  Enc Vitals Group     BP 132/79     Pulse Rate 88     Resp 18     Temp 97.8 F (36.6 C)     Temp Source Oral     SpO2 91 %     Weight 178  lb (80.7 kg)     Height 5\' 8"  (1.727 m)     Head Circumference      Peak Flow      Pain Score      Pain Loc      Pain Edu?      Excl. in [place]?     Constitutional: Alert and oriented. Well appearing and in no acute distress. Eyes: Conjunctivae are normal. PERRL. EOMI. fundi appear to be normal Head: Atraumatic. Nose: No congestion/rhinnorhea. Mouth/Throat: Mucous membranes are moist.  Oropharynx non-erythematous. Patient did bite her tongue Neck: No stridor.   Cardiovascular: Normal rate, regular rhythm. Grossly normal heart sounds.  Good peripheral circulation. Respiratory: Normal respiratory effort.  No retractions. Lungs CTAB. Gastrointestinal: Soft and nontender. No distention. No abdominal bruits. No CVA tenderness. Musculoskeletal: No lower extremity tenderness nor edema.  No joint effusions. Neurologic:  Normal speech and language. No gross focal neurologic deficits are appreciated. There is a small amount of facial asymmetry but the family reports that dust that does not look new. Cranial nerves II through XII are intact other visual fields were not checked. Finger to nose and rapid alternating movements and hands are normal. Heel-to-shin is slightly slow but is equal bilaterally. There is no numbness in any extremities. The motor strength in arms and legs is  normal. Skin:  Skin is warm, dry and intact. No rash noted. Psychiatric: Mood and affect are normal. Speech and behavior are normal.  ____________________________________________   LABS (all labs ordered are listed, but only abnormal results are displayed)  Labs Reviewed  COMPREHENSIVE METABOLIC PANEL - Abnormal; Notable for the following:       Result Value   CO2 18 (*)    Glucose, Bld 146 (*)    BUN 25 (*)    All other components within normal limits  CBC WITH DIFFERENTIAL/PLATELET - Abnormal; Notable for the following:    Neutro Abs 9.0 (*)    Lymphs Abs 0.8 (*)    All other components within normal limits  URINALYSIS, COMPLETE (UACMP) WITH MICROSCOPIC   ____________________________________________  EKG   ____________________________________________  RADIOLOGY  IMPRESSION: 1. No acute intracranial pathology seen on CT. 2. Subcortical white matter change likely reflects small vessel ischemic microangiopathy.   Electronically Signed   By: Garald Balding M.D.   On: 09/30/2016 21:58 ____________________________________________   PROCEDURES  Procedure(s) performed:  Procedures  Critical Care performed:   ____________________________________________   INITIAL IMPRESSION / ASSESSMENT AND PLAN / ED COURSE  Pertinent labs & imaging results that were available during my care of the patient were reviewed by me and considered in my medical decision making (see chart for details).  At present patient's O2 sats are higher off oxygen 94%. Chest x-ray has been ordered. My relief will check the chest x-ray and sure that her oxygen saturations remain elevated I have signed out the patient to him.      ____________________________________________   FINAL CLINICAL IMPRESSION(S) / ED DIAGNOSES  Final diagnoses:  Seizure ([place])      NEW MEDICATIONS STARTED DURING THIS VISIT:  New Prescriptions   PHENYTOIN (DILANTIN) 100 MG ER CAPSULE    Take 3 capsules (300  mg total) by mouth at bedtime.     Note:  This document was prepared using Dragon voice recognition software and may include unintentional dictation errors.    Nena Polio, MD 09/30/16 2337    Nena Polio, MD 09/30/16 [phone]

## 2016-09-30 NOTE — ED Notes (Signed)
Called pharmacy about dilantin.

## 2016-09-30 NOTE — Progress Notes (Signed)
PCP notes:   Health maintenance:  Flu vaccine - addressed Hep C screening - completed  Abnormal screenings:   Hearing - failed Depression score: 4  Patient concerns:   Pt is still grieving over loss of her daughter. Pt requested refill of Ambien. Refill request sent to PCP.   Nurse concerns:  None  Next PCP appt:   10/07/16 @ 1515  I reviewed health advisor's note, was available for consultation, and agree with documentation and plan. Loura Pardon MD

## 2016-10-01 ENCOUNTER — Inpatient Hospital Stay: Payer: Medicare Other

## 2016-10-01 ENCOUNTER — Other Ambulatory Visit: Payer: Self-pay | Admitting: Family Medicine

## 2016-10-01 ENCOUNTER — Encounter: Payer: Self-pay | Admitting: Internal Medicine

## 2016-10-01 DIAGNOSIS — E860 Dehydration: Secondary | ICD-10-CM | POA: Diagnosis not present

## 2016-10-01 DIAGNOSIS — C50919 Malignant neoplasm of unspecified site of unspecified female breast: Secondary | ICD-10-CM | POA: Diagnosis not present

## 2016-10-01 DIAGNOSIS — J449 Chronic obstructive pulmonary disease, unspecified: Secondary | ICD-10-CM | POA: Diagnosis not present

## 2016-10-01 DIAGNOSIS — R0902 Hypoxemia: Secondary | ICD-10-CM | POA: Diagnosis not present

## 2016-10-01 DIAGNOSIS — G40909 Epilepsy, unspecified, not intractable, without status epilepticus: Secondary | ICD-10-CM | POA: Diagnosis not present

## 2016-10-01 DIAGNOSIS — R569 Unspecified convulsions: Secondary | ICD-10-CM | POA: Diagnosis not present

## 2016-10-01 LAB — BASIC METABOLIC PANEL
ANION GAP: 6 (ref 5–15)
BUN: 20 mg/dL (ref 6–20)
CALCIUM: 9.2 mg/dL (ref 8.9–10.3)
CO2: 25 mmol/L (ref 22–32)
CREATININE: 0.83 mg/dL (ref 0.44–1.00)
Chloride: 105 mmol/L (ref 101–111)
GLUCOSE: 126 mg/dL — AB (ref 65–99)
Potassium: 4.4 mmol/L (ref 3.5–5.1)
Sodium: 136 mmol/L (ref 135–145)

## 2016-10-01 LAB — URINALYSIS, COMPLETE (UACMP) WITH MICROSCOPIC
BACTERIA UA: NONE SEEN
BILIRUBIN URINE: NEGATIVE
Glucose, UA: NEGATIVE mg/dL
HGB URINE DIPSTICK: NEGATIVE
KETONES UR: NEGATIVE mg/dL
LEUKOCYTES UA: NEGATIVE
NITRITE: NEGATIVE
PH: 5 (ref 5.0–8.0)
Protein, ur: NEGATIVE mg/dL
SPECIFIC GRAVITY, URINE: 1.006 (ref 1.005–1.030)

## 2016-10-01 LAB — PHENYTOIN LEVEL, TOTAL: Phenytoin Lvl: 24.2 ug/mL — ABNORMAL HIGH (ref 10.0–20.0)

## 2016-10-01 LAB — CBC
HCT: 38.9 % (ref 35.0–47.0)
HEMOGLOBIN: 13.2 g/dL (ref 12.0–16.0)
MCH: 30.7 pg (ref 26.0–34.0)
MCHC: 34 g/dL (ref 32.0–36.0)
MCV: 90.4 fL (ref 80.0–100.0)
PLATELETS: 253 10*3/uL (ref 150–440)
RBC: 4.31 MIL/uL (ref 3.80–5.20)
RDW: 13.4 % (ref 11.5–14.5)
WBC: 12.4 10*3/uL — ABNORMAL HIGH (ref 3.6–11.0)

## 2016-10-01 LAB — HEPATITIS C ANTIBODY: HCV Ab: NONREACTIVE

## 2016-10-01 MED ORDER — CLINDAMYCIN PHOSPHATE 600 MG/50ML IV SOLN
600.0000 mg | Freq: Three times a day (TID) | INTRAVENOUS | Status: DC
  Administered 2016-10-01: 600 mg via INTRAVENOUS
  Filled 2016-10-01 (×3): qty 50

## 2016-10-01 MED ORDER — IPRATROPIUM-ALBUTEROL 0.5-2.5 (3) MG/3ML IN SOLN
3.0000 mL | Freq: Once | RESPIRATORY_TRACT | Status: AC
  Administered 2016-10-01: 3 mL via RESPIRATORY_TRACT
  Filled 2016-10-01: qty 6

## 2016-10-01 MED ORDER — AMOXICILLIN-POT CLAVULANATE 875-125 MG PO TABS
1.0000 | ORAL_TABLET | Freq: Two times a day (BID) | ORAL | Status: DC
  Administered 2016-10-01: 1 via ORAL
  Filled 2016-10-01: qty 1

## 2016-10-01 MED ORDER — PHENYTOIN SODIUM EXTENDED 100 MG PO CAPS: 300.0000 mg | ORAL_CAPSULE | Freq: Every day | ORAL | 1 refills | Status: DC

## 2016-10-01 MED ORDER — PHENYTOIN SODIUM EXTENDED 100 MG PO CAPS
300.0000 mg | ORAL_CAPSULE | Freq: Every day | ORAL | Status: DC
  Filled 2016-10-01: qty 3

## 2016-10-01 MED ORDER — ONDANSETRON HCL 4 MG PO TABS: 4.0000 mg | ORAL_TABLET | Freq: Four times a day (QID) | ORAL | Status: DC | PRN

## 2016-10-01 MED ORDER — ZOLPIDEM TARTRATE 5 MG PO TABS: 10.0000 mg | ORAL_TABLET | Freq: Every evening | ORAL | Status: DC | PRN

## 2016-10-01 MED ORDER — CALCIUM CARBONATE-VITAMIN D 500-200 MG-UNIT PO TABS
2.0000 | ORAL_TABLET | Freq: Every day | ORAL | Status: DC
  Administered 2016-10-01: 09:00:00 2 via ORAL
  Filled 2016-10-01: qty 2

## 2016-10-01 MED ORDER — ALPRAZOLAM 0.5 MG PO TABS
0.5000 mg | ORAL_TABLET | Freq: Two times a day (BID) | ORAL | Status: DC | PRN
  Administered 2016-10-01: 0.5 mg via ORAL
  Filled 2016-10-01: qty 1

## 2016-10-01 MED ORDER — LORAZEPAM 2 MG/ML IJ SOLN: 2.0000 mg | INTRAMUSCULAR | Status: DC | PRN

## 2016-10-01 MED ORDER — SENNOSIDES-DOCUSATE SODIUM 8.6-50 MG PO TABS: 1.0000 | ORAL_TABLET | Freq: Every evening | ORAL | Status: DC | PRN

## 2016-10-01 MED ORDER — IPRATROPIUM-ALBUTEROL 0.5-2.5 (3) MG/3ML IN SOLN
3.0000 mL | Freq: Once | RESPIRATORY_TRACT | Status: AC
  Administered 2016-10-01: 3 mL via RESPIRATORY_TRACT

## 2016-10-01 MED ORDER — SODIUM CHLORIDE 0.9 % IV SOLN
INTRAVENOUS | Status: DC
  Administered 2016-10-01: 04:00:00 via INTRAVENOUS

## 2016-10-01 MED ORDER — AMOXICILLIN-POT CLAVULANATE 875-125 MG PO TABS: 1.0000 | ORAL_TABLET | Freq: Two times a day (BID) | ORAL | 0 refills | Status: DC

## 2016-10-01 MED ORDER — SODIUM CHLORIDE 0.9 % IV SOLN
Freq: Once | INTRAVENOUS | Status: AC
  Administered 2016-10-01: 02:00:00 via INTRAVENOUS

## 2016-10-01 MED ORDER — ACETAMINOPHEN 325 MG PO TABS
650.0000 mg | ORAL_TABLET | Freq: Four times a day (QID) | ORAL | Status: DC | PRN
  Filled 2016-10-01: qty 2

## 2016-10-01 MED ORDER — ANASTROZOLE 1 MG PO TABS
1.0000 mg | ORAL_TABLET | Freq: Every day | ORAL | Status: DC
  Administered 2016-10-01: 1 mg via ORAL
  Filled 2016-10-01: qty 1

## 2016-10-01 MED ORDER — ACETAMINOPHEN 650 MG RE SUPP: 650.0000 mg | Freq: Four times a day (QID) | RECTAL | Status: DC | PRN

## 2016-10-01 MED ORDER — ENOXAPARIN SODIUM 40 MG/0.4ML ~~LOC~~ SOLN: 40.0000 mg | SUBCUTANEOUS | Status: DC

## 2016-10-01 MED ORDER — FLUOXETINE HCL 20 MG PO CAPS
20.0000 mg | ORAL_CAPSULE | Freq: Every day | ORAL | Status: DC
  Administered 2016-10-01: 20 mg via ORAL
  Filled 2016-10-01: qty 1

## 2016-10-01 MED ORDER — CALCIUM-VITAMIN D-VITAMIN K 750-500-40 MG-UNT-MCG PO TABS: 2.0000 | ORAL_TABLET | Freq: Every day | ORAL | Status: DC

## 2016-10-01 MED ORDER — SODIUM CHLORIDE 0.9 % IV SOLN
1.5000 g | Freq: Once | INTRAVENOUS | Status: AC
  Administered 2016-10-01: 1.5 g via INTRAVENOUS
  Filled 2016-10-01: qty 1.5

## 2016-10-01 MED ORDER — ONDANSETRON HCL 4 MG/2ML IJ SOLN: 4.0000 mg | Freq: Four times a day (QID) | INTRAMUSCULAR | Status: DC | PRN

## 2016-10-01 NOTE — Care Management Obs Status (Signed)
[facility] NOTIFICATION   Patient Details  Name: YUKO COVENTRY MRN: [id number] Date of Birth: 11-Oct-1950   Medicare Observation Status Notification Given:  Yes    Jolly Mango, RN 10/01/2016, 12:25 PM

## 2016-10-01 NOTE — Care Management CC44 (Signed)
Condition Code 44 Documentation Completed  Patient Details  Name: Diana Cook MRN: [id number] Date of Birth: 01/23/1951   Condition Code 44 given:  Yes Patient signature on Condition Code 44 notice:  Yes Documentation of 2 MD's agreement:  Yes Code 44 added to claim:  Yes    Jolly Mango, RN 10/01/2016, 12:25 PM

## 2016-10-01 NOTE — H&P (Signed)
[facility] [place] NAME: Diana Cook    MR#:  [id number]  DATE OF BIRTH:  Oct 20, 1950  DATE OF ADMISSION:  09/30/2016  PRIMARY CARE PHYSICIAN: Tower, Wynelle Fanny, MD   REQUESTING/REFERRING PHYSICIAN:   CHIEF COMPLAINT:   Chief Complaint  Patient presents with  . Seizures    HISTORY OF PRESENT ILLNESS: Diana Cook  is a 66 y.o. female with a known history of Arthritis, bronchial asthma, breast cancer, complete heart block, COPD, pacemaker, seizure disorder presented to the emergency room with multiple seizures. Patient had 3 episodes of seizures yesterday. She last had a seizure at age of 85 and has been seizure-free for more than 20 years. She currently does not take any medication for seizures. Patient was evaluated in the emergency room with CT head which showed no acute abnormality. She was loaded with IV Dilantin. Hospitalist service was consulted. No complaints of any head injury. No fever or chills. Patient says her daughter died of cervical cancer at age of 14 last November and patient had some stress secondary to the.  PAST MEDICAL HISTORY:   Past Medical History:  Diagnosis Date  . Allergic rhinitis   . Arthritis   . Asthma    as a child, mild now  . Cancer [facility])    breast  . Colon polyps    colonoscopy 7/08, tubular adenoma  . Complete heart block ([place]) 8/13   s/p PPM [facility]  . COPD (chronic obstructive pulmonary disease) ([place])   . Myocardial infarction ([place]) 2011  . Pacemaker    2011  . Seizure disorder ([place])   . Seizures ([place])    first one was when she was 66 years old   . Small bowel obstruction ([place])    1988 and 2002  . Tobacco abuse     PAST SURGICAL HISTORY: Past Surgical History:  Procedure Laterality Date  . ABDOMINAL HYSTERECTOMY    . BREAST BIOPSY Right 11/2005   benign inflammatory changes, mass due to underwire bra  . BREAST BIOPSY Left 01/02/2016   columnar cell changes without  atypical hyperplasia.  Marland Kitchen BREAST LUMPECTOMY WITH SENTINEL LYMPH NODE BIOPSY Right 01/02/2016   Procedure: BREAST LUMPECTOMY WITH SENTINEL LYMPH NODE BX;  Surgeon: [name], MD;  Location: [facility] ORS;  Service: General;  Laterality: Right;  . CATARACT EXTRACTION Bilateral   . COLONOSCOPY  10/2015   Dr Ardis Hughs  . EXPLORATORY LAPAROTOMY  01/25/2001   Exploratory laparotomy, lysis of adhesions, identification of internal hernia secondary to omental adhesion. Prolonged postoperative ileus.  Marland Kitchen gyn surgery  1993   hysterectomy- form endometriosis  . LAPAROSCOPY    . PACEMAKER INSERTION  10/24/11   [employer] Advantio dual chamber PPM implanted by Dr Bunnie Philips at [facility] in [place]:  Social History  Substance Use Topics  . Smoking status: Former Smoker    Packs/day: 0.40    Years: 40.00    Types: Cigarettes    Quit date: 11/11/2010  . Smokeless tobacco: Never Used  . Alcohol use 2.4 oz/week    4 Glasses of wine per week     Comment: wine (pt said it's very occ)    FAMILY HISTORY:  Family History  Problem Relation Age of Onset  . Stroke Mother   . Heart disease Mother        MI  . Dementia Mother   . Coronary artery  disease Father   . Parkinsonism Father   . Cancer - Cervical Daughter 25       died 02-Apr-2022  . Colon cancer Neg Hx   . Breast cancer Neg Hx     DRUG ALLERGIES:  Allergies  Allergen Reactions  . Codeine Nausea And Vomiting    REACTION: nausea  . Morphine And Related Nausea Only    Nausea     REVIEW OF SYSTEMS:   CONSTITUTIONAL: No fever, fatigue or weakness.  EYES: No blurred or double vision.  EARS, NOSE, AND THROAT: No tinnitus or ear pain.  RESPIRATORY: No cough, shortness of breath, wheezing or hemoptysis.  CARDIOVASCULAR: No chest pain, orthopnea, edema.  GASTROINTESTINAL: No nausea, vomiting, diarrhea or abdominal pain.  GENITOURINARY: No dysuria, hematuria.  ENDOCRINE: No polyuria, nocturia,   HEMATOLOGY: No anemia, easy bruising or bleeding SKIN: No rash or lesion. MUSCULOSKELETAL: No joint pain or arthritis.   NEUROLOGIC: No tingling, numbness, weakness.  Had seizure PSYCHIATRY: No anxiety or depression.   MEDICATIONS AT HOME:  Prior to Admission medications   Medication Sig Start Date End Date Taking? Authorizing Provider  acetaminophen (TYLENOL) 500 MG tablet Take 500 mg by mouth every 6 (six) hours as needed.   Yes [provider]  ALPRAZolam (XANAX) 0.5 MG tablet Take 1 tablet (0.5 mg total) by mouth 2 (two) times daily as needed for anxiety. 03/18/16  Yes Tower, Wynelle Fanny, MD  anastrozole (ARIMIDEX) 1 MG tablet Take 1 tablet (1 mg total) by mouth daily. 09/16/16  Yes Byrnett, Forest Gleason, MD  Calcium-Vitamin D-Vitamin K 712-458-09 MG-UNT-MCG TABS Take 2 tablets by mouth daily.   Yes [provider]  doxycycline (MONODOX) 100 MG capsule Take 1 capsule by mouth 2 (two) times daily. 09/16/16  Yes [provider]  FLUoxetine (PROZAC) 20 MG tablet Take 1 tablet (20 mg total) by mouth daily. 04/11/16  Yes Tower, Wynelle Fanny, MD  furosemide (LASIX) 20 MG tablet TAKE ONE TABLET BY MOUTH ONCE DAILY AS NEEDED Patient taking differently: TAKE ONE TABLET BY MOUTH ONCE DAILY AS NEEDED FOR FLUID RETENTION/SWELLING 06/13/15  Yes Deboraha Sprang, MD  zolpidem (AMBIEN) 10 MG tablet TAKE ONE-HALF TO ONE TABLET BY MOUTH AT BEDTIME AS NEEDED FOR SLEEP 09/30/16  Yes Tower, Wynelle Fanny, MD  phenytoin (DILANTIN) 100 MG ER capsule Take 3 capsules (300 mg total) by mouth at bedtime. 09/30/16   Nena Polio, MD      PHYSICAL EXAMINATION:   VITAL SIGNS: Blood pressure (!) 92/44, pulse 64, temperature 97.8 F (36.6 C), temperature source Oral, resp. rate 20, height 5\' 8"  (1.727 m), weight 80.7 kg (178 lb), SpO2 100 %.  GENERAL:  66 y.o.-year-old patient lying in the bed with no acute distress.  EYES: Pupils equal, round, reactive to light and accommodation. No scleral icterus.  Extraocular muscles intact.  HEENT: Head atraumatic, normocephalic. Oropharynx and nasopharynx clear.  NECK:  Supple, no jugular venous distention. No thyroid enlargement, no tenderness.  LUNGS: Normal breath sounds bilaterally, no wheezing, rales,rhonchi or crepitation. No use of accessory muscles of respiration.  CARDIOVASCULAR: S1, S2 normal. No murmurs, rubs, or gallops.  ABDOMEN: Soft, nontender, nondistended. Bowel sounds present. No organomegaly or mass.  EXTREMITIES: No pedal edema, cyanosis, or clubbing.  NEUROLOGIC: Cranial nerves II through XII are intact. Muscle strength 5/5 in all extremities. Sensation intact. Gait not checked.  PSYCHIATRIC: The patient is alert and oriented x 3.  SKIN: No obvious rash, lesion, or ulcer.   LABORATORY PANEL:  CBC  Recent Labs Lab 09/30/16 1428 09/30/16 2028  WBC 6.9 10.2  HGB 13.8 13.6  HCT 41.8 40.8  PLT 270.0 254  MCV 90.5 89.7  MCH  --  30.0  MCHC 33.0 33.4  RDW 13.4 13.3  LYMPHSABS 1.1 0.8*  MONOABS 0.6 0.4  EOSABS 0.1 0.0  BASOSABS 0.1 0.0   ------------------------------------------------------------------------------------------------------------------  Chemistries   Recent Labs Lab 09/30/16 1428 09/30/16 2028  NA 134* 135  K 4.6 4.2  CL 101 104  CO2 26 18*  GLUCOSE 94 146*  BUN 23 25*  CREATININE 0.92 0.95  CALCIUM 10.3 9.6  AST 20 37  ALT 17 18  ALKPHOS 77 76  BILITOT 0.8 1.1   ------------------------------------------------------------------------------------------------------------------ estimated creatinine clearance is 63.2 mL/min (by C-G formula based on SCr of 0.95 mg/dL). ------------------------------------------------------------------------------------------------------------------  Recent Labs  09/30/16 1428  TSH 1.18     Coagulation profile No results for input(s): INR, PROTIME in the last 168  hours. ------------------------------------------------------------------------------------------------------------------- No results for input(s): DDIMER in the last 72 hours. -------------------------------------------------------------------------------------------------------------------  Cardiac Enzymes No results for input(s): CKMB, TROPONINI, MYOGLOBIN in the last 168 hours.  Invalid input(s): CK ------------------------------------------------------------------------------------------------------------------ Invalid input(s): POCBNP  ---------------------------------------------------------------------------------------------------------------  Urinalysis    Component Value Date/Time   COLORURINE Yellow 04/01/2013 0815   APPEARANCEUR Clear 04/01/2013 0815   LABSPEC >1.060 04/01/2013 0815   PHURINE 5.0 04/01/2013 0815   GLUCOSEU Negative 04/01/2013 0815   HGBUR Negative 04/01/2013 0815   BILIRUBINUR Negative 04/01/2013 0815   KETONESUR Negative 04/01/2013 0815   PROTEINUR Negative 04/01/2013 0815   NITRITE Negative 04/01/2013 0815   LEUKOCYTESUR Negative 04/01/2013 0815     RADIOLOGY: Dg Chest 2 View  Result Date: 10/01/2016 CLINICAL DATA:  Low oxygen saturation, postictal state. EXAM: CHEST  2 VIEW COMPARISON:  02/01/2016 FINDINGS: The heart size and mediastinal contours are within normal limits. Aortic atherosclerosis is again noted at the aortic arch without aneurysm. Left-sided pacemaker apparatus projects over the left upper lobe with right atrial and right ventricular leads again visualized without significant change. The lungs are mildly hyperinflated with chronic interstitial prominence. Mild superimposed airspace disease along the periphery of the upper, middle and lower lobes. Minimal superimposed pneumonia is not excluded. No acute nor suspicious osseous abnormality. IMPRESSION: Emphysematous hyperinflation of the lungs. Faint subpleural airspace opacities in the  periphery in the right upper, middle and lower lobes suspicious for pneumonia possibly representing stigmata of aspiration given history of recent seizures. Electronically Signed   By: Ashley Royalty M.D.   On: 10/01/2016 00:24   Ct Head Wo Contrast  Result Date: 09/30/2016 CLINICAL DATA:  Acute onset of seizures. Urinary incontinence. Initial encounter. EXAM: CT HEAD WITHOUT CONTRAST TECHNIQUE: Contiguous axial images were obtained from the base of the skull through the vertex without intravenous contrast. COMPARISON:  CT of the head performed 07/21/2013 FINDINGS: Brain: No evidence of acute infarction, hemorrhage, hydrocephalus, extra-axial collection or mass lesion/mass effect. Subcortical white matter change likely reflects small vessel ischemic microangiopathy. The posterior fossa, including the cerebellum, brainstem and fourth ventricle, is within normal limits. The third and lateral ventricles, and basal ganglia are unremarkable in appearance. The cerebral hemispheres are symmetric in appearance, with normal gray-white differentiation. No mass effect or midline shift is seen. Vascular: No hyperdense vessel or unexpected calcification. Skull: There is no evidence of fracture; visualized osseous structures are unremarkable in appearance. Sinuses/Orbits: The visualized portions of the orbits are within normal limits. The paranasal sinuses and mastoid air cells are well-aerated. Other: No significant  soft tissue abnormalities are seen. IMPRESSION: 1. No acute intracranial pathology seen on CT. 2. Subcortical white matter change likely reflects small vessel ischemic microangiopathy. Electronically Signed   By: Garald Balding M.D.   On: 09/30/2016 21:58    EKG: Orders placed or performed during the hospital encounter of 09/30/16  . ED EKG  . ED EKG  . EKG 12-Lead  . EKG 12-Lead    IMPRESSION AND PLAN: 66 year old female patient with history as breast cancer, COPD, seizure disorder presented to the  emergency room with multiple seizures. Admitting diagnosis 1. Breakthrough seizure 2. Emphysema 3. Breast cancer 4. Dehydration Treatment plan Admit patient to medical floor Start patient on oral Dilantin Check Dilantin level IV fluids Monitor for new seizures EEG Neurology consult  All the records are reviewed and case discussed with ED provider. Management plans discussed with the patient, family and they are in agreement.  CODE STATUS:FULL CODE    Code Status Orders        Start     Ordered   10/01/16 0232  Full code  Continuous     10/01/16 0231    Code Status History    Date Active Date Inactive Code Status Order ID Comments User Context   This patient has a current code status but no historical code status.       TOTAL TIME TAKING CARE OF THIS PATIENT: 50 minutes.    Saundra Shelling M.D on 10/01/2016 at 2:40 AM  Between 7am to 6pm - Pager - [phone]  After 6pm go to [email or web address] - password [email or web address] [facility]  [facility]  Office  [phone]  CC: Primary care physician; Tower, Wynelle Fanny, MD

## 2016-10-01 NOTE — ED Notes (Signed)
EDP placed patient back on 2 L [place]

## 2016-10-01 NOTE — Discharge Summary (Signed)
[name] NAME: Diana Cook    MR#:  [id number]  DATE OF BIRTH:  Jan 13, 1951  DATE OF ADMISSION:  09/30/2016 ADMITTING PHYSICIAN: Saundra Shelling, MD  DATE OF DISCHARGE: 10/01/2016  PRIMARY CARE PHYSICIAN: Tower, Wynelle Fanny, MD    ADMISSION DIAGNOSIS:  Seizure ([place]) [R56.9] Acute respiratory failure with hypoxia ([place]) [J96.01]  DISCHARGE DIAGNOSIS:  Seizure --Recurrent Aspiration pneumonia SECONDARY DIAGNOSIS:   Past Medical History:  Diagnosis Date  . Allergic rhinitis   . Arthritis   . Asthma    as a child, mild now  . Cancer [facility])    breast  . Colon polyps    colonoscopy 7/08, tubular adenoma  . Complete heart block ([place]) 8/13   s/p PPM implanted in [facility]  . COPD (chronic obstructive pulmonary disease) ([place])   . Myocardial infarction ([place]) 2011  . Pacemaker    2011  . Seizure disorder ([place])   . Seizures ([place])    first one was when she was 66 years old   . Small bowel obstruction ([place])    1988 and 2002  . Tobacco abuse     HOSPITAL COURSE:  Julyana Woolverton  is a 66 y.o. female with a known history of Arthritis, bronchial asthma, breast cancer, complete heart block, COPD, pacemaker, seizure disorder presented to the emergency room with multiple seizures. Patient had 3 episodes of seizures yesterday. She last had a seizure at age of 66 and has been seizure-free for more than 20 years. She currently does not take any medication for seizures.  1. Breakthrough seizure in patient with history of seizures who had been seizure-free for 25+ years -Patient reports recently been stressed out with her daughters death due to cervical cancer -She's been seizure-free here in the hospital -Seen by neurology recommends Dilantin 300 mg daily at bedtime -Patient has appointment to see Dr. Melrose Nakayama neurology 10/28/2016 -In the meantime we'll ask her to get Dilantin levels checked with her and make a physician.  2.  Aspiration pneumonia Patient asymptomatic Augmentin 875 twice a day for 7 days Patient has history of emphysema she is not in respiratory distress  - will wean off oxygen prior to discharge.  3. DVT prophylaxis subcutaneous Lovenox  Above was discussed with patient and husband  CONSULTS OBTAINED:  Treatment Team:  Leotis Pain, MD  DRUG ALLERGIES:   Allergies  Allergen Reactions  . Codeine Nausea And Vomiting    REACTION: nausea  . Morphine And Related Nausea Only    Nausea     DISCHARGE MEDICATIONS:   Current Discharge Medication List    START taking these medications   Details  amoxicillin-clavulanate (AUGMENTIN) 875-125 MG tablet Take 1 tablet by mouth every 12 (twelve) hours. Qty: 14 tablet, Refills: 0    phenytoin (DILANTIN) 100 MG ER capsule Take 3 capsules (300 mg total) by mouth at bedtime. Qty: 90 capsule, Refills: 1      CONTINUE these medications which have NOT CHANGED   Details  acetaminophen (TYLENOL) 500 MG tablet Take 500 mg by mouth every 6 (six) hours as needed.    ALPRAZolam (XANAX) 0.5 MG tablet Take 1 tablet (0.5 mg total) by mouth 2 (two) times daily as needed for anxiety. Qty: 30 tablet, Refills: 0    anastrozole (ARIMIDEX) 1 MG tablet Take 1 tablet (1 mg total) by mouth daily. Qty: 90 tablet, Refills: 3    Calcium-Vitamin D-Vitamin K 259-563-87 MG-UNT-MCG TABS Take 2 tablets  by mouth daily.    doxycycline (MONODOX) 100 MG capsule Take 1 capsule by mouth 2 (two) times daily.    FLUoxetine (PROZAC) 20 MG tablet Take 1 tablet (20 mg total) by mouth daily. Qty: 30 tablet, Refills: 11    furosemide (LASIX) 20 MG tablet TAKE ONE TABLET BY MOUTH ONCE DAILY AS NEEDED Qty: 30 tablet, Refills: 3    zolpidem (AMBIEN) 10 MG tablet TAKE ONE-HALF TO ONE TABLET BY MOUTH AT BEDTIME AS NEEDED FOR SLEEP Qty: 30 tablet, Refills: 5        If you experience worsening of your admission symptoms, develop shortness of breath, life threatening  emergency, suicidal or homicidal thoughts you must seek medical attention immediately by calling 911 or calling your MD immediately  if symptoms less severe.  You Must read complete instructions/literature along with all the possible adverse reactions/side effects for all the Medicines you take and that have been prescribed to you. Take any new Medicines after you have completely understood and accept all the possible adverse reactions/side effects.   Please note  You were cared for by a hospitalist during your hospital stay. If you have any questions about your discharge medications or the care you received while you were in the hospital after you are discharged, you can call the unit and asked to speak with the hospitalist on call if the hospitalist that took care of you is not available. Once you are discharged, your primary care physician will handle any further medical issues. Please note that NO REFILLS for any discharge medications will be authorized once you are discharged, as it is imperative that you return to your primary care physician (or establish a relationship with a primary care physician if you do not have one) for your aftercare needs so that they can reassess your need for medications and monitor your lab values. Today   SUBJECTIVE   No new complaints  VITAL SIGNS:  Blood pressure (!) 116/47, pulse 64, temperature 98.9 F (37.2 C), temperature source Oral, resp. rate 16, height 5\' 8"  (1.727 m), weight 81.9 kg (180 lb 9.6 oz), SpO2 92 %.  I/O:   Intake/Output Summary (Last 24 hours) at 10/01/16 1139 Last data filed at 10/01/16 0900  Gross per 24 hour  Intake              450 ml  Output                0 ml  Net              450 ml    PHYSICAL EXAMINATION:  GENERAL:  66 y.o.-year-old patient lying in the bed with no acute distress.  EYES: Pupils equal, round, reactive to light and accommodation. No scleral icterus. Extraocular muscles intact.  HEENT: Head atraumatic,  normocephalic. Oropharynx and nasopharynx clear.  NECK:  Supple, no jugular venous distention. No thyroid enlargement, no tenderness.  LUNGS: Normal breath sounds bilaterally, no wheezing, rales,rhonchi or crepitation. No use of accessory muscles of respiration.  CARDIOVASCULAR: S1, S2 normal. No murmurs, rubs, or gallops.  ABDOMEN: Soft, non-tender, non-distended. Bowel sounds present. No organomegaly or mass.  EXTREMITIES: No pedal edema, cyanosis, or clubbing.  NEUROLOGIC: Cranial nerves II through XII are intact. Muscle strength 5/5 in all extremities. Sensation intact. Gait not checked.  PSYCHIATRIC: The patient is alert and oriented x 3.  SKIN: No obvious rash, lesion, or ulcer.   DATA REVIEW:   CBC   Recent Labs Lab 10/01/16 0117  WBC 12.4*  HGB 13.2  HCT 38.9  PLT 253    Chemistries   Recent Labs Lab 09/30/16 2028 10/01/16 0117  NA 135 136  K 4.2 4.4  CL 104 105  CO2 18* 25  GLUCOSE 146* 126*  BUN 25* 20  CREATININE 0.95 0.83  CALCIUM 9.6 9.2  AST 37  --   ALT 18  --   ALKPHOS 76  --   BILITOT 1.1  --     Microbiology Results   No results found for this or any previous visit (from the past 240 hour(s)).  RADIOLOGY:  Dg Chest 2 View  Result Date: 10/01/2016 CLINICAL DATA:  Low oxygen saturation, postictal state. EXAM: CHEST  2 VIEW COMPARISON:  02/01/2016 FINDINGS: The heart size and mediastinal contours are within normal limits. Aortic atherosclerosis is again noted at the aortic arch without aneurysm. Left-sided pacemaker apparatus projects over the left upper lobe with right atrial and right ventricular leads again visualized without significant change. The lungs are mildly hyperinflated with chronic interstitial prominence. Mild superimposed airspace disease along the periphery of the upper, middle and lower lobes. Minimal superimposed pneumonia is not excluded. No acute nor suspicious osseous abnormality. IMPRESSION: Emphysematous hyperinflation of the  lungs. Faint subpleural airspace opacities in the periphery in the right upper, middle and lower lobes suspicious for pneumonia possibly representing stigmata of aspiration given history of recent seizures. Electronically Signed   By: Ashley Royalty M.D.   On: 10/01/2016 00:24   Ct Head Wo Contrast  Result Date: 09/30/2016 CLINICAL DATA:  Acute onset of seizures. Urinary incontinence. Initial encounter. EXAM: CT HEAD WITHOUT CONTRAST TECHNIQUE: Contiguous axial images were obtained from the base of the skull through the vertex without intravenous contrast. COMPARISON:  CT of the head performed 07/21/2013 FINDINGS: Brain: No evidence of acute infarction, hemorrhage, hydrocephalus, extra-axial collection or mass lesion/mass effect. Subcortical white matter change likely reflects small vessel ischemic microangiopathy. The posterior fossa, including the cerebellum, brainstem and fourth ventricle, is within normal limits. The third and lateral ventricles, and basal ganglia are unremarkable in appearance. The cerebral hemispheres are symmetric in appearance, with normal gray-white differentiation. No mass effect or midline shift is seen. Vascular: No hyperdense vessel or unexpected calcification. Skull: There is no evidence of fracture; visualized osseous structures are unremarkable in appearance. Sinuses/Orbits: The visualized portions of the orbits are within normal limits. The paranasal sinuses and mastoid air cells are well-aerated. Other: No significant soft tissue abnormalities are seen. IMPRESSION: 1. No acute intracranial pathology seen on CT. 2. Subcortical white matter change likely reflects small vessel ischemic microangiopathy. Electronically Signed   By: Garald Balding M.D.   On: 09/30/2016 21:58     Management plans discussed with the patient, family and they are in agreement.  CODE STATUS:     Code Status Orders        Start     Ordered   10/01/16 0232  Full code  Continuous     10/01/16 0231     Code Status History    Date Active Date Inactive Code Status Order ID Comments User Context   This patient has a current code status but no historical code status.      TOTAL TIME TAKING CARE OF THIS PATIENT: *40 minutes.    Isaac Lacson M.D on 10/01/2016 at 11:39 AM  Between 7am to 6pm - Pager - [phone] After 6pm go to [email or web address] - Proofreader  [employer]  [phone]  CC: Primary care physician; Tower, Wynelle Fanny, MD

## 2016-10-01 NOTE — ED Provider Notes (Signed)
Patient received in sign-out from Dr. Cinda Quest.  Workup and evaluation pending CXR and reassessment.  Patient noted to be hypoxic on room air down the low 80s. Placed on supplemental nasal cannula 2 L with improvement to the low 90s. Chest x-ray shows evidence of significant emphysema but probable aspiration pneumonia given her multiple seizures today. Do feel patient will require admission for IV antibiotics and further evaluation and management.  Have discussed with the patient and available family all diagnostics and treatments performed thus far and all questions were answered to the best of my ability. The patient demonstrates understanding and agreement with plan.       Merlyn Lot, MD 10/01/16 [phone]

## 2016-10-01 NOTE — Progress Notes (Signed)
Patient was discharged with husband. No complaints or SS of distress.

## 2016-10-01 NOTE — Discharge Instructions (Signed)
Please return for any further seizures for now. Please take the Dilantin 300 mg at bedtime starting tomorrow night. Please call the neurologist to arrange follow-up.   Remember do not drive.  Do not climb ladders.  Do not take baths since people have drowned in the bathtub taking baths when they had a seizure. You can take showers.  Do not go swimming.

## 2016-10-01 NOTE — ED Notes (Signed)
Called pharmacy about medication

## 2016-10-01 NOTE — ED Notes (Signed)
Instructed patient to take some slow deep breaths. O2 up to 94%.

## 2016-10-01 NOTE — Consult Note (Signed)
Reason for Consult:seizure activity  Referring Physician: Dr. Posey Pronto   CC: seizure activity   HPI: Diana Cook is an 66 y.o. female with a known history of Arthritis, bronchial asthma, breast cancer, complete heart block, COPD, pacemaker, seizure disorder presented to the emergency room with multiple seizures. Patient had 3 episodes of seizures yesterday. She last had a seizure at age of 33 and has been seizure-free for more than 20 years. Pt was prior on tegretol and dilantin.  Pt states there was recent death of her daughter which has caused increased stress in her life.    Past Medical History:  Diagnosis Date  . Allergic rhinitis   . Arthritis   . Asthma    as a child, mild now  . Cancer [facility])    breast  . Colon polyps    colonoscopy 7/08, tubular adenoma  . Complete heart block ([place]) 8/13   s/p PPM implanted in [facility]  . COPD (chronic obstructive pulmonary disease) ([place])   . Myocardial infarction ([place]) 2011  . Pacemaker    2011  . Seizure disorder ([place])   . Seizures ([place])    first one was when she was 66 years old   . Small bowel obstruction ([place])    1988 and 2002  . Tobacco abuse     Past Surgical History:  Procedure Laterality Date  . ABDOMINAL HYSTERECTOMY    . BREAST BIOPSY Right 11/2005   benign inflammatory changes, mass due to underwire bra  . BREAST BIOPSY Left 01/02/2016   columnar cell changes without atypical hyperplasia.  Marland Kitchen BREAST LUMPECTOMY WITH SENTINEL LYMPH NODE BIOPSY Right 01/02/2016   Procedure: BREAST LUMPECTOMY WITH SENTINEL LYMPH NODE BX;  Surgeon: [name], MD;  Location: [facility] ORS;  Service: General;  Laterality: Right;  . CATARACT EXTRACTION Bilateral   . COLONOSCOPY  10/2015   Dr Ardis Hughs  . EXPLORATORY LAPAROTOMY  01/25/2001   Exploratory laparotomy, lysis of adhesions, identification of internal hernia secondary to omental adhesion. Prolonged postoperative ileus.  Marland Kitchen gyn surgery  1993   hysterectomy- form endometriosis  .  LAPAROSCOPY    . PACEMAKER INSERTION  10/24/11   [employer] Advantio dual chamber PPM implanted by Dr Bunnie Philips at [facility] in [place]    Family History  Problem Relation Age of Onset  . Stroke Mother   . Heart disease Mother        MI  . Dementia Mother   . Coronary artery disease Father   . Parkinsonism Father   . Cancer - Cervical Daughter 93       died 20-Mar-2022  . Colon cancer Neg Hx   . Breast cancer Neg Hx     Social History:  reports that she quit smoking about 5 years ago. Her smoking use included Cigarettes. She has a 16.00 pack-year smoking history. She has never used smokeless tobacco. She reports that she drinks about 2.4 oz of alcohol per week . She reports that she does not use drugs.  Allergies  Allergen Reactions  . Codeine Nausea And Vomiting    REACTION: nausea  . Morphine And Related Nausea Only    Nausea     Medications: I have reviewed the patient's current medications.  ROS: History obtained from the patient  General ROS: negative for - chills, fatigue, fever, night sweats, weight gain or weight loss Psychological ROS: negative for - behavioral disorder, hallucinations, memory difficulties, mood swings or suicidal ideation Ophthalmic ROS: negative  for - blurry vision, double vision, eye pain or loss of vision ENT ROS: negative for - epistaxis, nasal discharge, oral lesions, sore throat, tinnitus or vertigo Allergy and Immunology ROS: negative for - hives or itchy/watery eyes Hematological and Lymphatic ROS: negative for - bleeding problems, bruising or swollen lymph nodes Endocrine ROS: negative for - galactorrhea, hair pattern changes, polydipsia/polyuria or temperature intolerance Respiratory ROS: negative for - cough, hemoptysis, shortness of breath or wheezing Cardiovascular ROS: negative for - chest pain, dyspnea on exertion, edema or irregular heartbeat Gastrointestinal ROS: negative for - abdominal pain, diarrhea,  hematemesis, nausea/vomiting or stool incontinence Genito-Urinary ROS: negative for - dysuria, hematuria, incontinence or urinary frequency/urgency Musculoskeletal ROS: negative for - joint swelling or muscular weakness Neurological ROS: as noted in HPI Dermatological ROS: negative for rash and skin lesion changes  Physical Examination: Blood pressure (!) 116/47, pulse 64, temperature 98.9 F (37.2 C), temperature source Oral, resp. rate 16, height 5\' 8"  (1.727 m), weight 81.9 kg (180 lb 9.6 oz), SpO2 98 %.  Neurological Examination   Mental Status: Alert, oriented, thought content appropriate.  Speech fluent without evidence of aphasia.  Able to follow 3 step commands without difficulty. Cranial Nerves: II: Discs flat bilaterally; Visual fields grossly normal, pupils equal, round, reactive to light and accommodation III,IV, VI: ptosis not present, extra-ocular motions intact bilaterally V,VII: smile symmetric, facial light touch sensation normal bilaterally VIII: hearing normal bilaterally IX,X: gag reflex present XI: bilateral shoulder shrug XII: midline tongue extension Motor: Right : Upper extremity   5/5    Left:     Upper extremity   5/5  Lower extremity   5/5     Lower extremity   5/5 Tone and bulk:normal tone throughout; no atrophy noted Sensory: Pinprick and light touch intact throughout, bilaterally Deep Tendon Reflexes: 1+ and symmetric throughout Plantars: Right: downgoing   Left: downgoing Cerebellar: normal finger-to-nose, normal rapid alternating movements and normal heel-to-shin test Gait: not tested      Laboratory Studies:   Basic Metabolic Panel:  Recent Labs Lab 09/30/16 1428 09/30/16 2028 10/01/16 0117  NA 134* 135 136  K 4.6 4.2 4.4  CL 101 104 105  CO2 26 18* 25  GLUCOSE 94 146* 126*  BUN 23 25* 20  CREATININE 0.92 0.95 0.83  CALCIUM 10.3 9.6 9.2    Liver Function Tests:  Recent Labs Lab 09/30/16 1428 09/30/16 2028  AST 20 37  ALT 17  18  ALKPHOS 77 76  BILITOT 0.8 1.1  PROT 7.7 7.2  ALBUMIN 4.3 3.9   No results for input(s): LIPASE, AMYLASE in the last 168 hours. No results for input(s): AMMONIA in the last 168 hours.  CBC:  Recent Labs Lab 09/30/16 1428 09/30/16 2028 10/01/16 0117  WBC 6.9 10.2 12.4*  NEUTROABS 5.1 9.0*  --   HGB 13.8 13.6 13.2  HCT 41.8 40.8 38.9  MCV 90.5 89.7 90.4  PLT 270.0 254 253    Cardiac Enzymes: No results for input(s): CKTOTAL, CKMB, CKMBINDEX, TROPONINI in the last 168 hours.  BNP: Invalid input(s): POCBNP  CBG: No results for input(s): GLUCAP in the last 168 hours.  Microbiology: No results found for this or any previous visit.  Coagulation Studies: No results for input(s): LABPROT, INR in the last 72 hours.  Urinalysis:  Recent Labs Lab 10/01/16 0740  [place] 1.006  PHURINE 5.0  GLUCOSEU NEGATIVE  HGBUR NEGATIVE  BILIRUBINUR NEGATIVE  KETONESUR NEGATIVE  PROTEINUR NEGATIVE  NITRITE NEGATIVE  LEUKOCYTESUR NEGATIVE    Lipid Panel:     Component Value Date/Time   CHOL 190 09/30/2016 1428   TRIG 69.0 09/30/2016 1428   HDL 67.50 09/30/2016 1428   CHOLHDL 3 09/30/2016 1428   VLDL 13.8 09/30/2016 1428   LDLCALC 108 (H) 09/30/2016 1428    HgbA1C:  Lab Results  Component Value Date   HGBA1C 5.9 09/30/2016    Urine Drug Screen:  No results found for: LABOPIA, COCAINSCRNUR, LABBENZ, AMPHETMU, THCU, LABBARB  Alcohol Level: No results for input(s): ETH in the last 168 hours.  Other results: EKG: normal EKG, normal sinus rhythm, unchanged from previous tracings.  Imaging: Dg Chest 2 View  Result Date: 10/01/2016 CLINICAL DATA:  Low oxygen saturation, postictal state. EXAM: CHEST  2 VIEW COMPARISON:  02/01/2016 FINDINGS: The heart size and mediastinal contours are within normal limits. Aortic atherosclerosis is again noted at the aortic arch without aneurysm. Left-sided pacemaker apparatus projects over the left upper lobe with  right atrial and right ventricular leads again visualized without significant change. The lungs are mildly hyperinflated with chronic interstitial prominence. Mild superimposed airspace disease along the periphery of the upper, middle and lower lobes. Minimal superimposed pneumonia is not excluded. No acute nor suspicious osseous abnormality. IMPRESSION: Emphysematous hyperinflation of the lungs. Faint subpleural airspace opacities in the periphery in the right upper, middle and lower lobes suspicious for pneumonia possibly representing stigmata of aspiration given history of recent seizures. Electronically Signed   By: Ashley Royalty M.D.   On: 10/01/2016 00:24   Ct Head Wo Contrast  Result Date: 09/30/2016 CLINICAL DATA:  Acute onset of seizures. Urinary incontinence. Initial encounter. EXAM: CT HEAD WITHOUT CONTRAST TECHNIQUE: Contiguous axial images were obtained from the base of the skull through the vertex without intravenous contrast. COMPARISON:  CT of the head performed 07/21/2013 FINDINGS: Brain: No evidence of acute infarction, hemorrhage, hydrocephalus, extra-axial collection or mass lesion/mass effect. Subcortical white matter change likely reflects small vessel ischemic microangiopathy. The posterior fossa, including the cerebellum, brainstem and fourth ventricle, is within normal limits. The third and lateral ventricles, and basal ganglia are unremarkable in appearance. The cerebral hemispheres are symmetric in appearance, with normal gray-white differentiation. No mass effect or midline shift is seen. Vascular: No hyperdense vessel or unexpected calcification. Skull: There is no evidence of fracture; visualized osseous structures are unremarkable in appearance. Sinuses/Orbits: The visualized portions of the orbits are within normal limits. The paranasal sinuses and mastoid air cells are well-aerated. Other: No significant soft tissue abnormalities are seen. IMPRESSION: 1. No acute intracranial  pathology seen on CT. 2. Subcortical white matter change likely reflects small vessel ischemic microangiopathy. Electronically Signed   By: Garald Balding M.D.   On: 09/30/2016 21:58     Assessment/Plan:  66 y.o. female with a known history of Arthritis, bronchial asthma, breast cancer, complete heart block, COPD, pacemaker, seizure disorder presented to the emergency room with multiple seizures. Patient had 3 episodes of seizures yesterday. She last had a seizure at age of 29 and has been seizure-free for more than 20 years. Pt was prior on tegretol and dilantin.  Pt states there was recent death of her daughter which has caused increased stress in her life.    Seizure could be stress related in setting of chronic seizures - Loaded with dilantin last night - Dilantin 300 qhs  - EEG today and d/c after EEG is done with follow up as out pt.   Leotis Pain  10/01/2016, 11:11 AM

## 2016-10-01 NOTE — Telephone Encounter (Signed)
Rx called in as prescribed 

## 2016-10-01 NOTE — ED Notes (Signed)
Had to slow down dilantin infusion due to burning patients veins. EDP aware and gave the verbal ok to slow down infusion. Patient states burning is not as bad.   Currently infusing at 177ml/hour

## 2016-10-02 ENCOUNTER — Telehealth: Payer: Self-pay | Admitting: *Deleted

## 2016-10-02 NOTE — Telephone Encounter (Signed)
Transition Care Management Follow-up Telephone Call   Date discharged? 10/01/2016    How have you been since you were released from the hospital? "good"   Do you understand why you were in the hospital? yes   Do you understand the discharge instructions? yes   Where were you discharged to? home   Items Reviewed:  Medications reviewed: yes  Allergies reviewed: yes  Dietary changes reviewed: no, n/a  Referrals reviewed: no, n/a   Functional Questionnaire:   Activities of Daily Living (ADLs):   She states they are independent in the following: states she is independent in all areas    Any transportation issues/concerns?: no   Any patient concerns? no   Confirmed importance and date/time of follow-up visits scheduled yes  Provider Appointment booked with Dr Glori Bickers 8/14  Confirmed with patient if condition begins to worsen call PCP or go to the ER.  Patient was given the office number and encouraged to call back with question or concerns.  : yes

## 2016-10-02 NOTE — Telephone Encounter (Signed)
Lm requesting return call to complete TCM and confirm hosp f/u appt  

## 2016-10-03 ENCOUNTER — Encounter: Payer: Self-pay | Admitting: General Surgery

## 2016-10-06 ENCOUNTER — Other Ambulatory Visit: Payer: Self-pay

## 2016-10-06 ENCOUNTER — Ambulatory Visit (INDEPENDENT_AMBULATORY_CARE_PROVIDER_SITE_OTHER): Payer: Medicare Other

## 2016-10-06 DIAGNOSIS — R0609 Other forms of dyspnea: Secondary | ICD-10-CM | POA: Diagnosis not present

## 2016-10-06 DIAGNOSIS — R569 Unspecified convulsions: Secondary | ICD-10-CM | POA: Diagnosis not present

## 2016-10-07 ENCOUNTER — Ambulatory Visit (INDEPENDENT_AMBULATORY_CARE_PROVIDER_SITE_OTHER): Payer: Medicare Other | Admitting: Family Medicine

## 2016-10-07 ENCOUNTER — Encounter: Payer: Self-pay | Admitting: Family Medicine

## 2016-10-07 ENCOUNTER — Encounter: Payer: Medicare Other | Admitting: Family Medicine

## 2016-10-07 VITALS — BP 114/66 | HR 72 | Temp 97.7°F | Ht 68.0 in | Wt 181.5 lb

## 2016-10-07 DIAGNOSIS — J690 Pneumonitis due to inhalation of food and vomit: Secondary | ICD-10-CM | POA: Diagnosis not present

## 2016-10-07 DIAGNOSIS — F4321 Adjustment disorder with depressed mood: Secondary | ICD-10-CM

## 2016-10-07 DIAGNOSIS — F4320 Adjustment disorder, unspecified: Secondary | ICD-10-CM

## 2016-10-07 DIAGNOSIS — R569 Unspecified convulsions: Secondary | ICD-10-CM | POA: Diagnosis not present

## 2016-10-07 NOTE — Progress Notes (Signed)
Subjective:    Patient ID: Diana Cook, female    DOB: June 10, 1950, 66 y.o.   MRN: [id number]  HPI Here for hospitalization follow up   She was hospitalized rom 8/7 to 10/01/16 for seizure with hypoxia   She presented to the ED with 3 seizures the day prior   (after being seizure free for 20 years) -not on medicine at the time Dx with breakthrough seizures  Did well in the hospital and was seen by neurology  Started on dilantin 300 mg daily at bedtime  She is to f/u with neuro Dr Melrose Nakayama on 9/4 Needs a dilantin level today  She was also diagnosed with aspiration pneumonia (asymptomatic) tx with augmentin bid for 7 d  Was briefly on 02 and d/c w/o it    Lab Results  Component Value Date   CREATININE 0.83 10/01/2016   BUN 20 10/01/2016   NA 136 10/01/2016   K 4.4 10/01/2016   CL 105 10/01/2016   CO2 25 10/01/2016   Lab Results  Component Value Date   ALT 18 09/30/2016   AST 37 09/30/2016   ALKPHOS 76 09/30/2016   BILITOT 1.1 09/30/2016   Lab Results  Component Value Date   WBC 12.4 (H) 10/01/2016   HGB 13.2 10/01/2016   HCT 38.9 10/01/2016   MCV 90.4 10/01/2016   PLT 253 10/01/2016   Lab Results  Component Value Date   TSH 1.18 09/30/2016    ua was negative   Dilantin level 8/8 was 24.2  (ref range 10-20) Non fasting glucose 126  Dg Chest 2 View  Result Date: 10/01/2016 CLINICAL DATA:  Low oxygen saturation, postictal state. EXAM: CHEST  2 VIEW COMPARISON:  02/01/2016 FINDINGS: The heart size and mediastinal contours are within normal limits. Aortic atherosclerosis is again noted at the aortic arch without aneurysm. Left-sided pacemaker apparatus projects over the left upper lobe with right atrial and right ventricular leads again visualized without significant change. The lungs are mildly hyperinflated with chronic interstitial prominence. Mild superimposed airspace disease along the periphery of the upper, middle and lower lobes. Minimal superimposed pneumonia  is not excluded. No acute nor suspicious osseous abnormality. IMPRESSION: Emphysematous hyperinflation of the lungs. Faint subpleural airspace opacities in the periphery in the right upper, middle and lower lobes suspicious for pneumonia possibly representing stigmata of aspiration given history of recent seizures. Electronically Signed   By: Ashley Royalty M.D.   On: 10/01/2016 00:24   Ct Head Wo Contrast  Result Date: 09/30/2016 CLINICAL DATA:  Acute onset of seizures. Urinary incontinence. Initial encounter. EXAM: CT HEAD WITHOUT CONTRAST TECHNIQUE: Contiguous axial images were obtained from the base of the skull through the vertex without intravenous contrast. COMPARISON:  CT of the head performed 07/21/2013 FINDINGS: Brain: No evidence of acute infarction, hemorrhage, hydrocephalus, extra-axial collection or mass lesion/mass effect. Subcortical white matter change likely reflects small vessel ischemic microangiopathy. The posterior fossa, including the cerebellum, brainstem and fourth ventricle, is within normal limits. The third and lateral ventricles, and basal ganglia are unremarkable in appearance. The cerebral hemispheres are symmetric in appearance, with normal gray-white differentiation. No mass effect or midline shift is seen. Vascular: No hyperdense vessel or unexpected calcification. Skull: There is no evidence of fracture; visualized osseous structures are unremarkable in appearance. Sinuses/Orbits: The visualized portions of the orbits are within normal limits. The paranasal sinuses and mastoid air cells are well-aerated. Other: No significant soft tissue abnormalities are seen. IMPRESSION: 1. No acute intracranial pathology seen  on CT. 2. Subcortical white matter change likely reflects small vessel ischemic microangiopathy. Electronically Signed   By: Garald Balding M.D.   On: 09/30/2016 21:58   Re assuring Ct of the head   Wt Readings from Last 3 Encounters:  10/07/16 181 lb 8 oz (82.3 kg)    10/01/16 180 lb 9.6 oz (81.9 kg)  09/30/16 178 lb (80.7 kg)   BP Readings from Last 3 Encounters:  10/07/16 114/66  10/01/16 (!) 116/47  09/30/16 102/68   Pulse ox today is 91% on RA (has copd)  Grief -lost daughter this year to cervical cancer  Xanax - not taking currently  ambien-refilled 8/7  prozac - does not want to change  Declines counseling   She is getting used to dilantin She was on it before  Tolerating it - but it makes her tired   No seizures since coming out of the hospital  Will wear a monitor and return to Dr Melrose Nakayama   3 more augmentin to finish  No breathing problems or cough or fever    Will be at Dr Lannie Fields office fri am  Patient Active Problem List   Diagnosis Date Noted  . Aspiration pneumonia ([place]) 10/07/2016  . Seizure ([place]) 10/01/2016  . Seizures ([place]) 10/01/2016  . Elevated glucose level 08/31/2016  . Grief reaction 04/11/2016  . Malignant neoplasm of upper-outer quadrant of right breast in female, estrogen receptor positive ([place]) 12/11/2015  . Osteopenia 11/22/2015  . Estrogen deficiency 09/07/2015  . Bruising 09/07/2015  . Wrinkles 09/07/2015  . Ringworm of body 05/22/2015  . Post herpetic neuralgia 09/01/2014  . Encounter for Medicare annual wellness exam 07/11/2014  . Routine general medical examination at a health care facility 06/26/2013  . H/O small bowel obstruction 04/15/2013  . Dyspnea on exertion 02/10/2013  . Complete heart block ([place]) 11/03/2011  . [employer] 11/03/2011  . Lumbar spinal stenosis 09/02/2011  . POSTMENOPAUSAL STATUS 08/06/2009  . HIDRADENITIS SUPPURATIVA 06/26/2008  . HERNIATED DISC 12/14/2007  . BACK, LOWER, PAIN 12/02/2007  . HX, PERSONAL, COLONIC POLYPS 09/14/2006  . Former smoker 06/10/2006  . ASTHMA 06/10/2006  . H/O idiopathic seizure 06/10/2006   Past Medical History:  Diagnosis Date  . Allergic rhinitis   . Arthritis   . Asthma    as a child, mild now  . Cancer [facility])     breast  . Colon polyps    colonoscopy 7/08, tubular adenoma  . Complete heart block ([place]) 8/13   s/p PPM implanted in [facility]  . COPD (chronic obstructive pulmonary disease) ([place])   . Myocardial infarction ([place]) 2011  . Pacemaker    2011  . Seizure disorder ([place])   . Seizures ([place])    first one was when she was 66 years old   . Small bowel obstruction ([place])    1988 and 2002  . Tobacco abuse    Past Surgical History:  Procedure Laterality Date  . ABDOMINAL HYSTERECTOMY    . BREAST BIOPSY Right 11/2005   benign inflammatory changes, mass due to underwire bra  . BREAST BIOPSY Left 01/02/2016   columnar cell changes without atypical hyperplasia.  Marland Kitchen BREAST LUMPECTOMY WITH SENTINEL LYMPH NODE BIOPSY Right 01/02/2016   Procedure: BREAST LUMPECTOMY WITH SENTINEL LYMPH NODE BX;  Surgeon: [name], MD;  Location: [facility] ORS;  Service: General;  Laterality: Right;  . CATARACT EXTRACTION Bilateral   . COLONOSCOPY  10/2015   Dr Ardis Hughs  . EXPLORATORY LAPAROTOMY  01/25/2001  Exploratory laparotomy, lysis of adhesions, identification of internal hernia secondary to omental adhesion. Prolonged postoperative ileus.  Marland Kitchen gyn surgery  1993   hysterectomy- form endometriosis  . LAPAROSCOPY    . PACEMAKER INSERTION  10/24/11   [employer] Advantio dual chamber PPM implanted by Dr Bunnie Philips at [facility] in [place] History  Substance Use Topics  . Smoking status: Former Smoker    Packs/day: 0.40    Years: 40.00    Types: Cigarettes    Quit date: 11/11/2010  . Smokeless tobacco: Never Used  . Alcohol use 2.4 oz/week    4 Glasses of wine per week     Comment: wine (pt said it's very occ)   Family History  Problem Relation Age of Onset  . Stroke Mother   . Heart disease Mother        MI  . Dementia Mother   . Coronary artery disease Father   . Parkinsonism Father   . Cancer - Cervical Daughter 33       died 2022-04-03  . Colon cancer Neg Hx   .  Breast cancer Neg Hx    Allergies  Allergen Reactions  . Codeine Nausea And Vomiting    REACTION: nausea  . Morphine And Related Nausea Only    Nausea    Current Outpatient Prescriptions on File Prior to Visit  Medication Sig Dispense Refill  . acetaminophen (TYLENOL) 500 MG tablet Take 500 mg by mouth every 6 (six) hours as needed.    . ALPRAZolam (XANAX) 0.5 MG tablet Take 1 tablet (0.5 mg total) by mouth 2 (two) times daily as needed for anxiety. 30 tablet 0  . amoxicillin-clavulanate (AUGMENTIN) 875-125 MG tablet Take 1 tablet by mouth every 12 (twelve) hours. 14 tablet 0  . anastrozole (ARIMIDEX) 1 MG tablet Take 1 tablet (1 mg total) by mouth daily. 90 tablet 3  . Calcium-Vitamin D-Vitamin K 161-096-04 MG-UNT-MCG TABS Take 2 tablets by mouth daily.    Marland Kitchen doxycycline (MONODOX) 100 MG capsule Take 1 capsule by mouth 2 (two) times daily.    Marland Kitchen FLUoxetine (PROZAC) 20 MG tablet Take 1 tablet (20 mg total) by mouth daily. 30 tablet 11  . furosemide (LASIX) 20 MG tablet TAKE ONE TABLET BY MOUTH ONCE DAILY AS NEEDED (Patient taking differently: TAKE ONE TABLET BY MOUTH ONCE DAILY AS NEEDED FOR FLUID RETENTION/SWELLING) 30 tablet 3  . phenytoin (DILANTIN) 100 MG ER capsule Take 3 capsules (300 mg total) by mouth at bedtime. 90 capsule 1  . zolpidem (AMBIEN) 10 MG tablet TAKE ONE-HALF TO ONE TABLET BY MOUTH AT BEDTIME AS NEEDED FOR SLEEP 30 tablet 5   No current facility-administered medications on file prior to visit.     Review of Systems    Review of Systems  Constitutional: Negative for fever, appetite change,  and unexpected weight change.  Eyes: Negative for pain and visual disturbance.  Respiratory: Negative for cough and shortness of breath.   Cardiovascular: Negative for cp or palpitations    Gastrointestinal: Negative for nausea, diarrhea and constipation.  Genitourinary: Negative for urgency and frequency.  Skin: Negative for pallor or rash   Neurological: Negative for  weakness, light-headedness, numbness and headaches.  Hematological: Negative for adenopathy. Does not bruise/bleed easily.  Psychiatric/Behavioral:. The patient is not nervous/anxious.  pos for grief with depressed mood    Objective:   Physical Exam  Constitutional: She is oriented to person, place, and time. She appears well-developed and  well-nourished. No distress.  Well appearing   HENT:  Head: Normocephalic and atraumatic.  Nose: Nose normal.  Mouth/Throat: Oropharynx is clear and moist. No oropharyngeal exudate.  No sinus tenderness No temporal tenderness    Eyes: Pupils are equal, round, and reactive to light. Conjunctivae and EOM are normal. Right eye exhibits no discharge. Left eye exhibits no discharge. No scleral icterus.  No nystagmus  Neck: Normal range of motion and full passive range of motion without pain. Neck supple. No JVD present. Carotid bruit is not present. No tracheal deviation present. No thyromegaly present.  Cardiovascular: Normal rate, regular rhythm and normal heart sounds.   No murmur heard. Pulmonary/Chest: Effort normal and breath sounds normal. No respiratory distress. She has no wheezes. She has no rales.  Mildly distant bs  No wheeze No rales or rhonchi Good air exch  Abdominal: Soft. Bowel sounds are normal. She exhibits no distension and no mass. There is no tenderness.  Musculoskeletal: She exhibits no edema or tenderness.  Lymphadenopathy:    She has no cervical adenopathy.  Neurological: She is alert and oriented to person, place, and time. She has normal strength and normal reflexes. She displays no atrophy and no tremor. No cranial nerve deficit or sensory deficit. She exhibits normal muscle tone. She displays a negative Romberg sign. Coordination and gait normal.  No focal cerebellar signs   Skin: Skin is warm and dry. No rash noted. No pallor.  Psychiatric: She has a normal mood and affect. Her behavior is normal. Thought content normal.    Pt becomes tearful when disc loss of daughter  Supportive family present          Assessment & Plan:   Problem List Items Addressed This Visit      Respiratory   Aspiration pneumonia ([place])    Mild/ R sided infiltrate seen in the hospital  Little to no symptoms  Reviewed hospital records, lab results and studies in  Past hx of smoking  Finish augmentin  Re check CXR in about a mo to confirm clearance Reassuring exam          Other   Grief reaction    Pt thinks she is doing fairly  Has good support Does not wish to change fluoxetine Not using xanax  Has ambien for sleep  Declines counseling Reviewed stressors/ coping techniques/symptoms/ support sources/ tx options and side effects in detail today       Seizure ([place])   Seizures ([place]) - Primary    Hospitalized after 3 seizures with hypoxia   (after no seizures for over 29 y) Reviewed hospital records, lab results and studies in detail  Pt wonders if stress related with loss of daughter  On dilantin 300 mg qhs  Tolerating/some sedation  Lab for level today  F/u with neuro/ Dr Melrose Nakayama Friday as planned No driving or swimming for now       Relevant Orders   Phenytoin level, total

## 2016-10-07 NOTE — Patient Instructions (Addendum)
Finish your augmentin  Continue dilantin  Dilantin level today   You need a chest xray in about a month for a re check  If cough/fever - short of breath- alert [place]   Take care of yourself

## 2016-10-08 LAB — PHENYTOIN LEVEL, TOTAL: PHENYTOIN LVL: 16.2 mg/L (ref 10.0–20.0)

## 2016-10-08 NOTE — Assessment & Plan Note (Signed)
Mild/ R sided infiltrate seen in the hospital  Little to no symptoms  Reviewed hospital records, lab results and studies in  Past hx of smoking  Finish augmentin  Re check CXR in about a mo to confirm clearance Reassuring exam

## 2016-10-08 NOTE — Assessment & Plan Note (Signed)
Pt thinks she is doing fairly  Has good support Does not wish to change fluoxetine Not using xanax  Has ambien for sleep  Declines counseling Reviewed stressors/ coping techniques/symptoms/ support sources/ tx options and side effects in detail today

## 2016-10-08 NOTE — Assessment & Plan Note (Signed)
Hospitalized after 3 seizures with hypoxia   (after no seizures for over 50 y) Reviewed hospital records, lab results and studies in detail  Pt wonders if stress related with loss of daughter  On dilantin 300 mg qhs  Tolerating/some sedation  Lab for level today  F/u with neuro/ Dr Melrose Nakayama Friday as planned No driving or swimming for now

## 2016-10-09 ENCOUNTER — Other Ambulatory Visit: Payer: Self-pay | Admitting: Internal Medicine

## 2016-10-10 ENCOUNTER — Telehealth: Payer: Self-pay | Admitting: Internal Medicine

## 2016-10-10 DIAGNOSIS — R569 Unspecified convulsions: Secondary | ICD-10-CM | POA: Diagnosis not present

## 2016-10-10 NOTE — Telephone Encounter (Signed)
Spoke with patient and let her know that Dr. Caryl Comes has not reviewed but that preliminary findings showed that her pumping function was within normal range. Let her know that once he reviewed the images then he would have [place] call her back with more detailed information. She was very appreciative for the call back and had no further questions at this time.

## 2016-10-10 NOTE — Telephone Encounter (Signed)
Pt would like echo results.

## 2016-10-11 DIAGNOSIS — R569 Unspecified convulsions: Secondary | ICD-10-CM | POA: Diagnosis not present

## 2016-10-12 DIAGNOSIS — R569 Unspecified convulsions: Secondary | ICD-10-CM | POA: Diagnosis not present

## 2016-10-14 ENCOUNTER — Telehealth: Payer: Self-pay | Admitting: *Deleted

## 2016-10-14 NOTE — Telephone Encounter (Signed)
Patient informed. 

## 2016-10-14 NOTE — Telephone Encounter (Signed)
-----   Message from Deboraha Sprang, MD sent at 10/11/2016 12:51 PM EDT ----- Please Inform Patient Diana Cook showed  normal  heart muscle function   hope her other heart is holding up since her daughter died  How are the grandchildren faring

## 2016-10-22 DIAGNOSIS — D1801 Hemangioma of skin and subcutaneous tissue: Secondary | ICD-10-CM | POA: Diagnosis not present

## 2016-10-22 DIAGNOSIS — L821 Other seborrheic keratosis: Secondary | ICD-10-CM | POA: Diagnosis not present

## 2016-10-22 DIAGNOSIS — L814 Other melanin hyperpigmentation: Secondary | ICD-10-CM | POA: Diagnosis not present

## 2016-11-04 ENCOUNTER — Ambulatory Visit (INDEPENDENT_AMBULATORY_CARE_PROVIDER_SITE_OTHER)
Admission: RE | Admit: 2016-11-04 | Discharge: 2016-11-04 | Disposition: A | Discharge status: Home / Self Care | Payer: Medicare Other | Source: Ambulatory Visit | Attending: Family Medicine | Admitting: Family Medicine

## 2016-11-04 ENCOUNTER — Encounter: Payer: Self-pay | Admitting: Family Medicine

## 2016-11-04 ENCOUNTER — Ambulatory Visit (INDEPENDENT_AMBULATORY_CARE_PROVIDER_SITE_OTHER): Payer: Medicare Other | Admitting: Family Medicine

## 2016-11-04 VITALS — BP 122/70 | HR 86 | Temp 98.0°F | Ht 68.0 in | Wt 176.8 lb

## 2016-11-04 DIAGNOSIS — R569 Unspecified convulsions: Secondary | ICD-10-CM | POA: Diagnosis not present

## 2016-11-04 DIAGNOSIS — Z170 Estrogen receptor positive status [ER+]: Secondary | ICD-10-CM | POA: Diagnosis not present

## 2016-11-04 DIAGNOSIS — J690 Pneumonitis due to inhalation of food and vomit: Secondary | ICD-10-CM

## 2016-11-04 DIAGNOSIS — C50411 Malignant neoplasm of upper-outer quadrant of right female breast: Secondary | ICD-10-CM | POA: Diagnosis not present

## 2016-11-04 DIAGNOSIS — R7309 Other abnormal glucose: Secondary | ICD-10-CM

## 2016-11-04 DIAGNOSIS — Z23 Encounter for immunization: Secondary | ICD-10-CM | POA: Diagnosis not present

## 2016-11-04 DIAGNOSIS — M85859 Other specified disorders of bone density and structure, unspecified thigh: Secondary | ICD-10-CM

## 2016-11-04 DIAGNOSIS — J189 Pneumonia, unspecified organism: Secondary | ICD-10-CM | POA: Diagnosis not present

## 2016-11-04 NOTE — Assessment & Plan Note (Signed)
Tolerating dilantin  Will f/u with neurology later this month  No more seizure

## 2016-11-04 NOTE — Progress Notes (Signed)
Subjective:    Patient ID: Diana Cook, female    DOB: May 13, 1950, 66 y.o.   MRN: [id number]  HPI Here for annual f/u of chronic health problems   Caring for 68 yo and 56 yo (grandkids)  Wt Readings from Last 3 Encounters:  11/04/16 176 lb 12 oz (80.2 kg)  10/07/16 181 lb 8 oz (82.3 kg)  10/01/16 180 lb 9.6 oz (81.9 kg)   26.87 kg/m  Last visit was a hospital f/u for seizures and aspiration pneumonia with hypoxia  Due for repeat cxr  Coughs occasionally and it makes her chest sore  Still feels tired  Pulse ox 92%  Has appt with neuro the 24th  Getting used to the dilantin  No more seizures    amw on 8/17 Missed high and low tones in L ear on hearing screen  She has not noticed hearing loss day to day  20/40 vision both eyes (will need cataract surgery)  Flu vaccine -will get that today   Depression score 4 (still in grief from loss of daughter to cervical cancer) Still does not sleep well   Mammogram 10/17- she is already set up  Breast cancer - with treatment  On arimidex  Self breast exam   Colonoscopy 9/17 Recall 10/2018  dexa 9/17-osteopenia hip  No falls or fractures On ca and D   Nl pap 07 Has had a hysterectomy   zostavax 7/17  Labs: Results for orders placed or performed in visit on 10/07/16  Phenytoin level, total  Result Value Ref Range   Phenytoin Lvl 16.2 10.0 - 20.0 mg/L    Lab Results  Component Value Date   WBC 12.4 (H) 10/01/2016   HGB 13.2 10/01/2016   HCT 38.9 10/01/2016   MCV 90.4 10/01/2016   PLT 253 10/01/2016   when sick with pneumonia  Lab Results  Component Value Date   CREATININE 0.83 10/01/2016   BUN 20 10/01/2016   NA 136 10/01/2016   K 4.4 10/01/2016   CL 105 10/01/2016   CO2 25 10/01/2016    Lab Results  Component Value Date   ALT 18 09/30/2016   AST 37 09/30/2016   ALKPHOS 76 09/30/2016   BILITOT 1.1 09/30/2016    Lab Results  Component Value Date   HGBA1C 5.9 09/30/2016   (loves pasta-tries to  avoid) On weight watchers  Lab Results  Component Value Date   TSH 1.18 09/30/2016    Cholesterol Lab Results  Component Value Date   CHOL 190 09/30/2016   CHOL 168 07/10/2014   CHOL 194 06/27/2013   Lab Results  Component Value Date   HDL 67.50 09/30/2016   HDL 55.80 07/10/2014   HDL 57.70 06/27/2013   Lab Results  Component Value Date   LDLCALC 108 (H) 09/30/2016   LDLCALC 97 07/10/2014   LDLCALC 111 (H) 06/27/2013   Lab Results  Component Value Date   TRIG 69.0 09/30/2016   TRIG 76.0 07/10/2014   TRIG 126.0 06/27/2013   Lab Results  Component Value Date   CHOLHDL 3 09/30/2016   CHOLHDL 3 07/10/2014   CHOLHDL 3 06/27/2013   No results found for: LDLDIRECT  LDL is up very slightly- expect it to come down  Ratio stayed the same     Patient Active Problem List   Diagnosis Date Noted  . Aspiration pneumonia ([place]) 10/07/2016  . Seizures ([place]) 10/01/2016  . Elevated glucose 08/31/2016  . Grief reaction 04/11/2016  . Malignant neoplasm  of upper-outer quadrant of right breast in female, estrogen receptor positive ([place]) 12/11/2015  . Osteopenia 11/22/2015  . Estrogen deficiency 09/07/2015  . Bruising 09/07/2015  . Wrinkles 09/07/2015  . Ringworm of body 05/22/2015  . Post herpetic neuralgia 09/01/2014  . Encounter for Medicare annual wellness exam 07/11/2014  . Routine general medical examination at a health care facility 06/26/2013  . H/O small bowel obstruction 04/15/2013  . Dyspnea on exertion 02/10/2013  . Complete heart block ([place]) 11/03/2011  . [employer] 11/03/2011  . Lumbar spinal stenosis 09/02/2011  . POSTMENOPAUSAL STATUS 08/06/2009  . HIDRADENITIS SUPPURATIVA 06/26/2008  . HERNIATED DISC 12/14/2007  . BACK, LOWER, PAIN 12/02/2007  . HX, PERSONAL, COLONIC POLYPS 09/14/2006  . Former smoker 06/10/2006  . ASTHMA 06/10/2006  . H/O idiopathic seizure 06/10/2006   Past Medical History:  Diagnosis Date  . Allergic rhinitis   .  Arthritis   . Asthma    as a child, mild now  . Cancer [facility])    breast  . Colon polyps    colonoscopy 7/08, tubular adenoma  . Complete heart block ([place]) 8/13   s/p PPM implanted in [facility]  . COPD (chronic obstructive pulmonary disease) ([place])   . Myocardial infarction ([place]) 2011  . Pacemaker    2011  . Seizure disorder ([place])   . Seizures ([place])    first one was when she was 66 years old   . Small bowel obstruction ([place])    1988 and 2002  . Tobacco abuse    Past Surgical History:  Procedure Laterality Date  . ABDOMINAL HYSTERECTOMY    . BREAST BIOPSY Right 11/2005   benign inflammatory changes, mass due to underwire bra  . BREAST BIOPSY Left 01/02/2016   columnar cell changes without atypical hyperplasia.  Marland Kitchen BREAST LUMPECTOMY WITH SENTINEL LYMPH NODE BIOPSY Right 01/02/2016   Procedure: BREAST LUMPECTOMY WITH SENTINEL LYMPH NODE BX;  Surgeon: [name], MD;  Location: [facility] ORS;  Service: General;  Laterality: Right;  . CATARACT EXTRACTION Bilateral   . COLONOSCOPY  10/2015   Dr Ardis Hughs  . EXPLORATORY LAPAROTOMY  01/25/2001   Exploratory laparotomy, lysis of adhesions, identification of internal hernia secondary to omental adhesion. Prolonged postoperative ileus.  Marland Kitchen gyn surgery  1993   hysterectomy- form endometriosis  . LAPAROSCOPY    . PACEMAKER INSERTION  10/24/11   [employer] Advantio dual chamber PPM implanted by Dr Bunnie Philips at [facility] in [place] History  Substance Use Topics  . Smoking status: Former Smoker    Packs/day: 0.40    Years: 40.00    Types: Cigarettes    Quit date: 11/11/2010  . Smokeless tobacco: Never Used  . Alcohol use 2.4 oz/week    4 Glasses of wine per week     Comment: wine (pt said it's very occ)   Family History  Problem Relation Age of Onset  . Stroke Mother   . Heart disease Mother        MI  . Dementia Mother   . Coronary artery disease Father   . Parkinsonism Father   . Cancer -  Cervical Daughter 6       died 24-Mar-2022  . Colon cancer Neg Hx   . Breast cancer Neg Hx    Allergies  Allergen Reactions  . Codeine Nausea And Vomiting    REACTION: nausea  . Morphine And Related Nausea Only    Nausea    Current  Outpatient Prescriptions on File Prior to Visit  Medication Sig Dispense Refill  . acetaminophen (TYLENOL) 500 MG tablet Take 500 mg by mouth every 6 (six) hours as needed.    . ALPRAZolam (XANAX) 0.5 MG tablet Take 1 tablet (0.5 mg total) by mouth 2 (two) times daily as needed for anxiety. 30 tablet 0  . anastrozole (ARIMIDEX) 1 MG tablet Take 1 tablet (1 mg total) by mouth daily. 90 tablet 3  . Calcium-Vitamin D-Vitamin K 443-154-00 MG-UNT-MCG TABS Take 2 tablets by mouth daily.    Marland Kitchen doxycycline (MONODOX) 100 MG capsule Take 1 capsule by mouth 2 (two) times daily.    Marland Kitchen FLUoxetine (PROZAC) 20 MG tablet Take 1 tablet (20 mg total) by mouth daily. 30 tablet 11  . furosemide (LASIX) 20 MG tablet TAKE ONE TABLET BY MOUTH ONCE DAILY AS NEEDED 30 tablet 3  . phenytoin (DILANTIN) 100 MG ER capsule Take 3 capsules (300 mg total) by mouth at bedtime. 90 capsule 1  . zolpidem (AMBIEN) 10 MG tablet TAKE ONE-HALF TO ONE TABLET BY MOUTH AT BEDTIME AS NEEDED FOR SLEEP 30 tablet 5   No current facility-administered medications on file prior to visit.     Review of Systems  Constitutional: Positive for fatigue. Negative for activity change, appetite change, fever and unexpected weight change.  HENT: Negative for congestion, ear pain, rhinorrhea, sinus pressure and sore throat.   Eyes: Negative for pain, redness and visual disturbance.  Respiratory: Negative for cough, shortness of breath and wheezing.        Pos for chest soreness with deep inspiration   Cardiovascular: Negative for chest pain and palpitations.  Gastrointestinal: Negative for abdominal pain, blood in stool, constipation and diarrhea.  Endocrine: Negative for polydipsia and polyuria.  Genitourinary: Negative  for dysuria, frequency and urgency.  Musculoskeletal: Negative for arthralgias, back pain and myalgias.  Skin: Negative for pallor and rash.  Allergic/Immunologic: Negative for environmental allergies.  Neurological: Negative for dizziness, syncope and headaches.  Hematological: Negative for adenopathy. Does not bruise/bleed easily.  Psychiatric/Behavioral: Negative for decreased concentration and dysphoric mood. The patient is not nervous/anxious.        Pos for grief        Objective:   Physical Exam  Constitutional: She appears well-developed and well-nourished. No distress.  Well appearing   HENT:  Head: Normocephalic and atraumatic.  Right Ear: External ear normal.  Left Ear: External ear normal.  Mouth/Throat: Oropharynx is clear and moist.  Eyes: Pupils are equal, round, and reactive to light. Conjunctivae and EOM are normal. No scleral icterus.  Neck: Normal range of motion. Neck supple. No JVD present. Carotid bruit is not present. No thyromegaly present.  Cardiovascular: Normal rate, regular rhythm, normal heart sounds and intact distal pulses.  Exam reveals no gallop.   Pulmonary/Chest: Effort normal and breath sounds normal. No respiratory distress. She has no wheezes. She exhibits no tenderness.  Diffusely distant bs   No crackles or wheeze  Good air exch overall   Abdominal: Soft. Bowel sounds are normal. She exhibits no distension, no abdominal bruit and no mass. There is no tenderness.  Genitourinary: No breast swelling, tenderness, discharge or bleeding.  Genitourinary Comments: Breast exam: No mass, nodules, thickening, tenderness, bulging, retraction, inflamation, nipple discharge or skin changes noted.  No axillary or clavicular LA.    Fibrocystic with scars from multiple procedures bilaterally   Musculoskeletal: Normal range of motion. She exhibits no edema or tenderness.  No kyphosis   Lymphadenopathy:  She has no cervical adenopathy.  Neurological: She is  alert. She has normal reflexes. No cranial nerve deficit. She exhibits normal muscle tone. Coordination normal.  Skin: Skin is warm and dry. No rash noted. No erythema. No pallor.  Solar lentigines diffusely  Some sks   Psychiatric: She has a normal mood and affect.          Assessment & Plan:   Problem List Items Addressed This Visit      Respiratory   Aspiration pneumonia ([place]) - Primary    Due for repeat cxr today  Improved but still c/o of some chest soreness on deep inspiration  Reassuring exam  Pulse ox 92% RA Past hx of smoking No c/o sob      Relevant Orders   DG Chest 2 View     Musculoskeletal and Integument   Osteopenia    Rev dexa 9/17 Due for next in a year  Watch closely due to breast cancer treatment  No falls or fx  Adv to continue ca and D         Other   Elevated glucose    Lab Results  Component Value Date   HGBA1C 5.9 09/30/2016   disc imp of low glycemic diet and wt loss to prevent DM2       Malignant neoplasm of upper-outer quadrant of right breast in female, estrogen receptor positive ([place])    Pt is doing well with arimidex  Has mammogram planned in October        Seizures [facility])    Tolerating dilantin  Will f/u with neurology later this month  No more seizure        Other Visit Diagnoses    Need for influenza vaccination       Relevant Orders   Flu Vaccine QUAD 6+ mos PF IM (Fluarix Quad PF) (Completed)

## 2016-11-04 NOTE — Assessment & Plan Note (Signed)
Due for repeat cxr today  Improved but still c/o of some chest soreness on deep inspiration  Reassuring exam  Pulse ox 92% RA Past hx of smoking No c/o sob

## 2016-11-04 NOTE — Assessment & Plan Note (Signed)
Pt is doing well with arimidex  Has mammogram planned in October

## 2016-11-04 NOTE — Assessment & Plan Note (Signed)
Rev dexa 9/17 Due for next in a year  Watch closely due to breast cancer treatment  No falls or fx  Adv to continue ca and D

## 2016-11-04 NOTE — Assessment & Plan Note (Signed)
Lab Results  Component Value Date   HGBA1C 5.9 09/30/2016   disc imp of low glycemic diet and wt loss to prevent DM2

## 2016-11-04 NOTE — Patient Instructions (Addendum)
Flu shot today   Chest xray today   Get your mammogram as planned   Try taking your ambien on an empty stomach and see if it works better   You are a bit borderline for blood sugar  Try to get most of your carbohydrates from produce (with the exception of white potatoes)  Eat less bread/pasta/rice/snack foods/cereals/sweets and other items from the middle of the grocery store (processed carbs)

## 2016-11-17 DIAGNOSIS — R569 Unspecified convulsions: Secondary | ICD-10-CM | POA: Diagnosis not present

## 2016-12-02 ENCOUNTER — Ambulatory Visit (INDEPENDENT_AMBULATORY_CARE_PROVIDER_SITE_OTHER): Payer: Medicare Other | Admitting: *Deleted

## 2016-12-02 DIAGNOSIS — I442 Atrioventricular block, complete: Secondary | ICD-10-CM

## 2016-12-02 DIAGNOSIS — Z961 Presence of intraocular lens: Secondary | ICD-10-CM | POA: Diagnosis not present

## 2016-12-02 NOTE — Progress Notes (Signed)
Remote pacemaker transmission.   

## 2016-12-03 ENCOUNTER — Ambulatory Visit: Payer: Medicare Other | Attending: Neurology

## 2016-12-03 DIAGNOSIS — G4733 Obstructive sleep apnea (adult) (pediatric): Secondary | ICD-10-CM | POA: Insufficient documentation

## 2016-12-05 ENCOUNTER — Ambulatory Visit
Admission: RE | Admit: 2016-12-05 | Discharge: 2016-12-05 | Disposition: A | Discharge status: Home / Self Care | Payer: Medicare Other | Source: Ambulatory Visit | Attending: General Surgery | Admitting: General Surgery

## 2016-12-05 ENCOUNTER — Encounter: Payer: Self-pay | Admitting: Cardiology

## 2016-12-05 DIAGNOSIS — C50411 Malignant neoplasm of upper-outer quadrant of right female breast: Secondary | ICD-10-CM

## 2016-12-05 DIAGNOSIS — Z170 Estrogen receptor positive status [ER+]: Secondary | ICD-10-CM

## 2016-12-05 DIAGNOSIS — R922 Inconclusive mammogram: Secondary | ICD-10-CM | POA: Diagnosis not present

## 2016-12-05 DIAGNOSIS — Z853 Personal history of malignant neoplasm of breast: Secondary | ICD-10-CM | POA: Diagnosis not present

## 2016-12-05 HISTORY — DX: Personal history of irradiation: Z92.3

## 2016-12-05 HISTORY — DX: Malignant neoplasm of unspecified site of unspecified female breast: C50.919

## 2016-12-09 LAB — CUP PACEART REMOTE DEVICE CHECK
Battery Remaining Longevity: 36 mo
Battery Remaining Percentage: 56 %
Date Time Interrogation Session: 20181009053100
Implantable Lead Implant Date: 20130830
Implantable Lead Implant Date: 20130830
Implantable Lead Location: 753859
Implantable Lead Serial Number: 523784
Implantable Pulse Generator Implant Date: 20130830
Lead Channel Impedance Value: 395 Ohm
Lead Channel Pacing Threshold Amplitude: 0.5 V
Lead Channel Pacing Threshold Pulse Width: 0.5 ms
Lead Channel Setting Pacing Amplitude: 2 V
Lead Channel Setting Sensing Sensitivity: 2.5 mV
MDC IDC LEAD LOCATION: 753860
MDC IDC LEAD SERIAL: 473325
MDC IDC MSMT LEADCHNL RA IMPEDANCE VALUE: 483 Ohm
MDC IDC PG SERIAL: 134132
MDC IDC SET LEADCHNL RV PACING AMPLITUDE: 2.4 V
MDC IDC SET LEADCHNL RV PACING PULSEWIDTH: 0.5 ms
MDC IDC STAT BRADY RA PERCENT PACED: 6 %
MDC IDC STAT BRADY RV PERCENT PACED: 100 %

## 2016-12-10 ENCOUNTER — Encounter: Payer: Self-pay | Admitting: General Surgery

## 2016-12-10 ENCOUNTER — Ambulatory Visit (INDEPENDENT_AMBULATORY_CARE_PROVIDER_SITE_OTHER): Payer: Medicare Other | Admitting: General Surgery

## 2016-12-10 VITALS — BP 140/72 | HR 72 | Resp 13 | Ht 68.0 in | Wt 182.0 lb

## 2016-12-10 DIAGNOSIS — C50411 Malignant neoplasm of upper-outer quadrant of right female breast: Secondary | ICD-10-CM

## 2016-12-10 DIAGNOSIS — Z170 Estrogen receptor positive status [ER+]: Secondary | ICD-10-CM

## 2016-12-10 NOTE — Patient Instructions (Signed)
The patient has been asked to return to the office in one year with a bilateral diagnostic mammogram. 

## 2016-12-10 NOTE — Progress Notes (Signed)
Patient ID: Diana Cook, female   DOB: 1950/08/02, 66 y.o.   MRN: [id number]  Chief Complaint  Patient presents with  . Follow-up    HPI Diana Cook is a 66 y.o. female who presents for a breast evaluation. The most recent mammogram was done on 12/05/2016 .  Patient does perform regular self breast checks and gets regular mammograms done.  Denies any new lumps or concerns.   Patient has Her first seizure in over 30 years in August. Now being followed by neurology.  HPI  Past Medical History:  Diagnosis Date  . Allergic rhinitis   . Arthritis   . Asthma    as a child, mild now  . Breast cancer ([place]) 01/06/2016   right breast cancer  . Cancer [facility])    breast  . Colon polyps    colonoscopy 7/08, tubular adenoma  . Complete heart block ([place]) 8/13   s/p PPM implanted in [facility]  . COPD (chronic obstructive pulmonary disease) ([place])   . Myocardial infarction ([place]) 2011  . Osteopenia 10/2015  . Pacemaker    2011  . Personal history of radiation therapy   . Seizure disorder ([place])   . Seizures ([place])    first one was when she was 66 years old   . Small bowel obstruction ([place])    1988 and 2002  . Tobacco abuse     Past Surgical History:  Procedure Laterality Date  . ABDOMINAL HYSTERECTOMY    . BREAST BIOPSY Right 11/2005   benign inflammatory changes, mass due to underwire bra  . BREAST BIOPSY Left 01/02/2016   columnar cell changes without atypical hyperplasia.  Marland Kitchen BREAST EXCISIONAL BIOPSY Left 01/06/2016   hyperplasia  . BREAST LUMPECTOMY Right 01/06/2016  . BREAST LUMPECTOMY WITH SENTINEL LYMPH NODE BIOPSY Right 01/02/2016   Procedure: BREAST LUMPECTOMY WITH SENTINEL LYMPH NODE BX;  Surgeon: [name], MD;  Location: [facility] ORS;  Service: General;  Laterality: Right;  . CATARACT EXTRACTION Bilateral   . COLONOSCOPY  10/2015   Dr Ardis Hughs  . EXPLORATORY LAPAROTOMY  01/25/2001   Exploratory laparotomy, lysis of adhesions, identification of internal hernia  secondary to omental adhesion. Prolonged postoperative ileus.  Marland Kitchen gyn surgery  1993   hysterectomy- form endometriosis  . LAPAROSCOPY    . PACEMAKER INSERTION  10/24/11   [employer] Advantio dual chamber PPM implanted by Dr Bunnie Philips at [facility] in [place]    Family History  Problem Relation Age of Onset  . Stroke Mother   . Heart disease Mother        MI  . Dementia Mother   . Coronary artery disease Father   . Parkinsonism Father   . Cancer - Cervical Daughter 89       died 18-Mar-2022  . Colon cancer Neg Hx   . Breast cancer Neg Hx     Social History Social History  Substance Use Topics  . Smoking status: Former Smoker    Packs/day: 0.40    Years: 40.00    Types: Cigarettes    Quit date: 11/11/2010  . Smokeless tobacco: Never Used  . Alcohol use 2.4 oz/week    4 Glasses of wine per week     Comment: wine (pt said it's very occ)    Allergies  Allergen Reactions  . Codeine Nausea And Vomiting    REACTION: nausea  . Morphine And Related Nausea Only    Nausea     Current  Outpatient Prescriptions  Medication Sig Dispense Refill  . acetaminophen (TYLENOL) 500 MG tablet Take 500 mg by mouth every 6 (six) hours as needed.    Marland Kitchen anastrozole (ARIMIDEX) 1 MG tablet Take 1 tablet (1 mg total) by mouth daily. 90 tablet 3  . FLUoxetine (PROZAC) 20 MG tablet Take 1 tablet (20 mg total) by mouth daily. 30 tablet 11  . furosemide (LASIX) 20 MG tablet TAKE ONE TABLET BY MOUTH ONCE DAILY AS NEEDED 30 tablet 3  . lamoTRIgine (LAMICTAL) 25 MG tablet Take 25 mg by mouth daily.    . Melatonin 5 MG CHEW Chew by mouth daily.    . phenytoin (DILANTIN) 100 MG ER capsule Take 3 capsules (300 mg total) by mouth at bedtime. 90 capsule 1  . zolpidem (AMBIEN) 10 MG tablet TAKE ONE-HALF TO ONE TABLET BY MOUTH AT BEDTIME AS NEEDED FOR SLEEP 30 tablet 5  . Calcium-Vitamin D-Vitamin K 952-841-32 MG-UNT-MCG TABS Take 2 tablets by mouth daily.     No current  facility-administered medications for this visit.     Review of Systems Review of Systems  Constitutional: Negative.   Respiratory: Negative.   Cardiovascular: Negative.     Blood pressure 140/72, pulse 72, resp. rate 13, height 5\' 8"  (1.727 m), weight 182 lb (82.6 kg).  Physical Exam Physical Exam  Constitutional: She is oriented to person, place, and time. She appears well-developed and well-nourished.  Eyes: Conjunctivae are normal. No scleral icterus.  Neck: Neck supple.  Cardiovascular: Normal rate, regular rhythm and normal heart sounds.   Pulmonary/Chest: Effort normal and breath sounds normal. Right breast exhibits no inverted nipple, no mass, no nipple discharge, no skin change and no tenderness. Left breast exhibits no inverted nipple, no mass, no nipple discharge, no skin change and no tenderness.    Right breast incision is clean and well healed.   Lymphadenopathy:    She has no cervical adenopathy.    She has no axillary adenopathy.  Neurological: She is alert and oriented to person, place, and time.  Skin: Skin is warm and dry.    Data Reviewed Bilateral diagnostic mammograms of 12/05/2016 were reviewed. No discernible residual calcifications identified in the medial aspect of the left breast.  Postsurgical changes in the upper-outer quadrant of the right breast. BIRAD-2.  Bone density testing 11/22/2015 showed osteopenia in the femoral area, normal spine.  Assessment    The patient has tolerated the initiation of anastrozole well, adverse effects with Femara.  No evidence of recurrent disease.  Osteopenia, continued need for calcium support.    Plan    The patient will continue to take her 1500 mg of calcium with vitamin D daily.      The patient has been asked to return to the office in one year with a bilateral diagnostic mammogram.  HPI, Physical Exam, Assessment and Plan have been scribed under the direction and in the presence of Hervey Ard,  MD.  Gaspar Cola, CMA  I have completed the exam and reviewed the above documentation for accuracy and completeness.  I agree with the above.  Haematologist has been used and any errors in dictation or transcription are unintentional.  Hervey Ard, M.D., F.A.C.S.  [name] 12/12/2016, 8:10 AM

## 2016-12-12 ENCOUNTER — Encounter: Payer: Self-pay | Admitting: General Surgery

## 2016-12-18 ENCOUNTER — Encounter: Payer: Self-pay | Admitting: Family Medicine

## 2016-12-18 ENCOUNTER — Ambulatory Visit (INDEPENDENT_AMBULATORY_CARE_PROVIDER_SITE_OTHER): Payer: Medicare Other | Admitting: Family Medicine

## 2016-12-18 VITALS — BP 140/70 | HR 72 | Temp 98.7°F | Ht 68.0 in | Wt 183.5 lb

## 2016-12-18 DIAGNOSIS — J440 Chronic obstructive pulmonary disease with acute lower respiratory infection: Secondary | ICD-10-CM | POA: Diagnosis not present

## 2016-12-18 DIAGNOSIS — J209 Acute bronchitis, unspecified: Secondary | ICD-10-CM

## 2016-12-18 MED ORDER — LEVOFLOXACIN 500 MG PO TABS: 500.0000 mg | ORAL_TABLET | Freq: Every day | ORAL | 0 refills | Status: DC

## 2016-12-18 MED ORDER — PREDNISONE 20 MG PO TABS: ORAL_TABLET | ORAL | 0 refills | Status: DC

## 2016-12-18 NOTE — Progress Notes (Signed)
Dr. Frederico Hamman T. Dorthey Depace, MD, [facility] [place], [address] Phone: [phone] Fax: [phone]  12/18/2016  Patient: Diana Cook, MRN: [id number], DOB: 21-Oct-1950, 66 y.o.  Primary Physician:  Tower, Wynelle Fanny, MD   Chief Complaint  Patient presents with  . Cough  . Ears Stopped Up  . Fever    on & off  . Shortness of Breath   Subjective:   Diana Cook is a 66 y.o. very pleasant female patient who presents with the following:  Cough that is really bad and over than a week. Trouble hearing and some nasal congestion. Blood coming from the nose. Coughing up some blood. She smoked for about 40 years.  She tells me that she does have some increased shortness of breath at baseline even without being sick.  Maybe a slight temp.  Quit smoking 6 years ago.  COPD?   Past Medical History, Surgical History, Social History, Family History, Problem List, Medications, and Allergies have been reviewed and updated if relevant.  Patient Active Problem List   Diagnosis Date Noted  . Aspiration pneumonia ([place]) 10/07/2016  . Seizures ([place]) 10/01/2016  . Elevated glucose 08/31/2016  . Grief reaction 04/11/2016  . Malignant neoplasm of upper-outer quadrant of right breast in female, estrogen receptor positive ([place]) 12/11/2015  . Osteopenia 11/22/2015  . Estrogen deficiency 09/07/2015  . Bruising 09/07/2015  . Wrinkles 09/07/2015  . Ringworm of body 05/22/2015  . Post herpetic neuralgia 09/01/2014  . Encounter for Medicare annual wellness exam 07/11/2014  . Routine general medical examination at a health care facility 06/26/2013  . H/O small bowel obstruction 04/15/2013  . Dyspnea on exertion 02/10/2013  . Complete heart block ([place]) 11/03/2011  . [employer] 11/03/2011  . Lumbar spinal stenosis 09/02/2011  . POSTMENOPAUSAL STATUS 08/06/2009  . HIDRADENITIS SUPPURATIVA 06/26/2008  . HERNIATED DISC  12/14/2007  . BACK, LOWER, PAIN 12/02/2007  . HX, PERSONAL, COLONIC POLYPS 09/14/2006  . Former smoker 06/10/2006  . ASTHMA 06/10/2006  . H/O idiopathic seizure 06/10/2006    Past Medical History:  Diagnosis Date  . Allergic rhinitis   . Arthritis   . Asthma    as a child, mild now  . Breast cancer ([place]) 01/06/2016   right breast cancer  . Cancer [facility])    breast  . Colon polyps    colonoscopy 7/08, tubular adenoma  . Complete heart block ([place]) 8/13   s/p PPM implanted in [facility]  . COPD (chronic obstructive pulmonary disease) ([place])   . Myocardial infarction ([place]) 2011  . Osteopenia 10/2015  . Pacemaker    2011  . Personal history of radiation therapy   . Seizure disorder ([place])   . Seizures ([place])    first one was when she was 66 years old   . Small bowel obstruction ([place])    1988 and 2002  . Tobacco abuse     Past Surgical History:  Procedure Laterality Date  . ABDOMINAL HYSTERECTOMY    . BREAST BIOPSY Right 11/2005   benign inflammatory changes, mass due to underwire bra  . BREAST BIOPSY Left 01/02/2016   columnar cell changes without atypical hyperplasia.  Marland Kitchen BREAST EXCISIONAL BIOPSY Left 01/06/2016   hyperplasia  . BREAST LUMPECTOMY Right 01/06/2016  . BREAST LUMPECTOMY WITH SENTINEL LYMPH NODE BIOPSY Right 01/02/2016   Procedure: BREAST LUMPECTOMY WITH SENTINEL LYMPH NODE BX;  Surgeon: [name], MD;  Location: [facility] ORS;  Service: General;  Laterality: Right;  . CATARACT EXTRACTION Bilateral   . COLONOSCOPY  10/2015   Dr Ardis Hughs  . EXPLORATORY LAPAROTOMY  01/25/2001   Exploratory laparotomy, lysis of adhesions, identification of internal hernia secondary to omental adhesion. Prolonged postoperative ileus.  Marland Kitchen gyn surgery  1993   hysterectomy- form endometriosis  . LAPAROSCOPY    . PACEMAKER INSERTION  10/24/11   [employer] Advantio dual chamber PPM implanted by Dr Bunnie Philips at [facility] in [place] History  . Marital status: Married    Spouse name: N/A  . Number of children: N/A  . Years of education: N/A   Occupational History  .  Retired   Social History Main Topics  . Smoking status: Former Smoker    Packs/day: 0.40    Years: 40.00    Types: Cigarettes    Quit date: 11/11/2010  . Smokeless tobacco: Never Used  . Alcohol use 2.4 oz/week    4 Glasses of wine per week     Comment: wine (pt said it's very occ)  . Drug use: No  . Sexual activity: No   Other Topics Concern  . Not on file   Social History Narrative   Married 40 years.   Exercises, does walking tapes    Family History  Problem Relation Age of Onset  . Stroke Mother   . Heart disease Mother        MI  . Dementia Mother   . Coronary artery disease Father   . Parkinsonism Father   . Cancer - Cervical Daughter 49       died March 31, 2022  . Colon cancer Neg Hx   . Breast cancer Neg Hx     Allergies  Allergen Reactions  . Codeine Nausea And Vomiting    REACTION: nausea  . Morphine And Related Nausea Only    Nausea     Medication list reviewed and updated in full in [place].  ROS: GEN: Acute illness details above GI: Tolerating PO intake GU: maintaining adequate hydration and urination Pulm: No SOB Interactive and getting along well at home.  Otherwise, ROS is as per the HPI.  Objective:   BP 140/70   Pulse 72   Temp 98.7 F (37.1 C) (Oral)   Ht 5\' 8"  (1.727 m)   Wt 183 lb 8 oz (83.2 kg)   SpO2 94%   BMI 27.90 kg/m    GEN: A and O x 3. WDWN. NAD.    ENT: Nose clear, ext NML.  No LAD.  No JVD.  TM's clear. Oropharynx clear.  PULM: Normal WOB, no distress. No crackles, rare wheezes, + b rhonchi. CV: RRR, no M/G/R, No rubs, No JVD.   EXT: warm and well-perfused, No c/c/e. PSYCH: Pleasant and conversant.    Laboratory and Imaging Data:  Assessment and Plan:   Acute bronchitis with COPD ([place])  40 pack year smoking history, suspicious for COPD at baseline with  acute infection currently.  We'll treat with steroids and antibiotics.  Follow-up: No Follow-up on file.  Future Appointments Date Time Provider [place]  03/03/2017 9:50 AM [facility] DEVICE REMOTES [facility]  04/08/2017 1:30 PM Noreene Filbert, MD [facility] None  12/08/2017 1:00 PM Pinson, Venetia Maxon, LPN [facility]    Meds ordered this encounter  Medications  . levofloxacin (LEVAQUIN) 500 MG tablet    Sig: Take 1 tablet (500 mg total) by mouth  daily.    Dispense:  10 tablet    Refill:  0  . predniSONE (DELTASONE) 20 MG tablet    Sig: 2 tabs po for 4 days, then 1 tab for 3 days    Dispense:  11 tablet    Refill:  0   Signed,  Kushi Kun T. Burnice Vassel, MD   Allergies as of 12/18/2016      Reactions   Codeine Nausea And Vomiting   REACTION: nausea   Morphine And Related Nausea Only   Nausea       Medication List       Accurate as of 12/18/16  6:25 PM. Always use your most recent med list.          acetaminophen 500 MG tablet Commonly known as:  TYLENOL Take 500 mg by mouth every 6 (six) hours as needed.   anastrozole 1 MG tablet Commonly known as:  ARIMIDEX Take 1 tablet (1 mg total) by mouth daily.   Calcium-Vitamin D-Vitamin K 563-875-64 MG-UNT-MCG Tabs Take 2 tablets by mouth daily.   FLUoxetine 20 MG tablet Commonly known as:  PROZAC Take 1 tablet (20 mg total) by mouth daily.   furosemide 20 MG tablet Commonly known as:  LASIX TAKE ONE TABLET BY MOUTH ONCE DAILY AS NEEDED   lamoTRIgine 25 MG tablet Commonly known as:  LAMICTAL Take 25 mg by mouth daily.   levofloxacin 500 MG tablet Commonly known as:  LEVAQUIN Take 1 tablet (500 mg total) by mouth daily.   Melatonin 5 MG Chew Chew by mouth daily.   phenytoin 100 MG ER capsule Commonly known as:  DILANTIN Take 3 capsules (300 mg total) by mouth at bedtime.   predniSONE 20 MG tablet Commonly known as:  DELTASONE 2 tabs po for 4 days, then 1 tab for 3 days     zolpidem 10 MG tablet Commonly known as:  AMBIEN TAKE ONE-HALF TO ONE TABLET BY MOUTH AT BEDTIME AS NEEDED FOR SLEEP

## 2016-12-29 ENCOUNTER — Encounter: Payer: Self-pay | Admitting: Internal Medicine

## 2017-01-19 DIAGNOSIS — G4733 Obstructive sleep apnea (adult) (pediatric): Secondary | ICD-10-CM | POA: Diagnosis not present

## 2017-01-19 DIAGNOSIS — R569 Unspecified convulsions: Secondary | ICD-10-CM | POA: Diagnosis not present

## 2017-03-03 ENCOUNTER — Ambulatory Visit (INDEPENDENT_AMBULATORY_CARE_PROVIDER_SITE_OTHER): Payer: Medicare Other | Admitting: *Deleted

## 2017-03-03 DIAGNOSIS — I442 Atrioventricular block, complete: Secondary | ICD-10-CM

## 2017-03-03 NOTE — Progress Notes (Signed)
Remote pacemaker transmission.   

## 2017-03-04 ENCOUNTER — Encounter: Payer: Self-pay | Admitting: Cardiology

## 2017-03-05 LAB — CUP PACEART REMOTE DEVICE CHECK
Battery Remaining Longevity: 36 mo
Brady Statistic RV Percent Paced: 100 %
Date Time Interrogation Session: 20190108063100
Implantable Lead Implant Date: 20130830
Implantable Lead Location: 753860
Implantable Lead Serial Number: 473325
Lead Channel Pacing Threshold Amplitude: 0.5 V
Lead Channel Pacing Threshold Pulse Width: 0.5 ms
Lead Channel Setting Pacing Pulse Width: 0.5 ms
MDC IDC LEAD IMPLANT DT: 20130830
MDC IDC LEAD LOCATION: 753859
MDC IDC LEAD SERIAL: 523784
MDC IDC MSMT BATTERY REMAINING PERCENTAGE: 54 %
MDC IDC MSMT LEADCHNL RA IMPEDANCE VALUE: 536 Ohm
MDC IDC MSMT LEADCHNL RV IMPEDANCE VALUE: 407 Ohm
MDC IDC PG IMPLANT DT: 20130830
MDC IDC SET LEADCHNL RA PACING AMPLITUDE: 2 V
MDC IDC SET LEADCHNL RV PACING AMPLITUDE: 2.4 V
MDC IDC SET LEADCHNL RV SENSING SENSITIVITY: 2.5 mV
MDC IDC STAT BRADY RA PERCENT PACED: 4 %
Pulse Gen Serial Number: 134132

## 2017-03-24 ENCOUNTER — Telehealth: Payer: Self-pay

## 2017-03-24 MED ORDER — FLUOXETINE HCL 20 MG PO CAPS: 20.0000 mg | ORAL_CAPSULE | Freq: Every day | ORAL | 11 refills | Status: DC

## 2017-03-24 NOTE — Telephone Encounter (Signed)
I have been working with cover my meds to obtain prior auth on patient's fluoxetine. In addition, I have spoken with [facility] ([place]) and they think the problem is that the r/x is for tablet form and her insurance will only cover capsule form.  They ask for new r/x for next script written for capsules and they will run it through to see if they can obtain authorization.    If this doesn't work I will need to call [employer] directly.  Patient currently has 9 days left of medicine.  She was last seen for routine f/u in September of 2018.    Dr. Glori Bickers,  Can we try sending in new R/X for fluoxetine 20mg  daily in CAPSULE form rather than tablet?  Thanks.

## 2017-03-24 NOTE — Addendum Note (Signed)
Addended by: Loura Pardon A on: 03/24/2017 05:34 PM   Modules accepted: Orders

## 2017-03-24 NOTE — Telephone Encounter (Signed)
I sent the capsules

## 2017-03-25 DIAGNOSIS — G4733 Obstructive sleep apnea (adult) (pediatric): Secondary | ICD-10-CM | POA: Diagnosis not present

## 2017-03-25 DIAGNOSIS — R569 Unspecified convulsions: Secondary | ICD-10-CM | POA: Diagnosis not present

## 2017-03-25 NOTE — Telephone Encounter (Signed)
Noted.  Confirmed with pharmacy that insurance has approved for patient the capsule form with a $1.00 only copay.    I LM on patient's VM (okay per DPR) with info.  Thanks.

## 2017-04-08 ENCOUNTER — Other Ambulatory Visit: Payer: Self-pay | Admitting: Family Medicine

## 2017-04-08 ENCOUNTER — Ambulatory Visit: Payer: Medicare Other | Admitting: Radiation Oncology

## 2017-04-09 NOTE — Telephone Encounter (Signed)
Last office visit 12/18/2016 with Dr. Lorelei Pont for Bronchitis.  Last refilled 09/30/2016 for #30 with 5 refills.  Ok to refill?

## 2017-04-15 ENCOUNTER — Ambulatory Visit: Payer: Medicare Other | Admitting: Radiation Oncology

## 2017-04-16 ENCOUNTER — Encounter (INDEPENDENT_AMBULATORY_CARE_PROVIDER_SITE_OTHER): Payer: Self-pay

## 2017-04-16 ENCOUNTER — Ambulatory Visit
Admission: RE | Admit: 2017-04-16 | Discharge: 2017-04-16 | Disposition: A | Discharge status: Home / Self Care | Payer: Medicare Other | Source: Ambulatory Visit | Attending: Radiation Oncology | Admitting: Radiation Oncology

## 2017-04-16 ENCOUNTER — Other Ambulatory Visit: Payer: Self-pay

## 2017-04-16 VITALS — BP 113/56 | HR 56 | Temp 97.1°F | Resp 12 | Ht 68.0 in | Wt 190.4 lb

## 2017-04-16 DIAGNOSIS — Z79810 Long term (current) use of selective estrogen receptor modulators (SERMs): Secondary | ICD-10-CM | POA: Insufficient documentation

## 2017-04-16 DIAGNOSIS — C50411 Malignant neoplasm of upper-outer quadrant of right female breast: Secondary | ICD-10-CM | POA: Diagnosis not present

## 2017-04-16 DIAGNOSIS — Z170 Estrogen receptor positive status [ER+]: Secondary | ICD-10-CM | POA: Insufficient documentation

## 2017-04-16 DIAGNOSIS — Z923 Personal history of irradiation: Secondary | ICD-10-CM | POA: Diagnosis not present

## 2017-04-16 NOTE — Progress Notes (Signed)
Patient here for follow up no changes since last appt

## 2017-04-16 NOTE — Progress Notes (Signed)
Radiation Oncology Follow up Note  Name: Diana Cook   Date:   04/16/2017 MRN:  [id number] DOB: October 08, 1950    This 67 y.o. female presents to the clinic today for 1 year follow-up status post whole breast radiation to her right breast for stage I invasive ER/PR positive invasive mammary carcinoma.  REFERRING PROVIDER: Abner Greenspan, MD  HPI: Patient is a 67 year old female now out 1 year having completed radiation therapy to her right breast for stage I ER/PR positive invasive mammary carcinoma status post wide local excision. Seen today in routine follow-up she is doing well. She states she still has pain and tenderness in her right breast. She specifically denies cough or bone pain.. She had mammograms back in October which I have reviewed were BI-RADS 2 benign. She's currently on arimadex tolerating that well without side effect.  COMPLICATIONS OF TREATMENT: none  FOLLOW UP COMPLIANCE: keeps appointments   PHYSICAL EXAM:  BP (!) 113/56 (BP Location: Left Arm, Patient Position: Sitting, Cuff Size: Large)   Pulse (!) 56   Temp (!) 97.1 F (36.2 C) (Tympanic)   Resp 12   Ht 5\' 8"  (1.727 m)   Wt 190 lb 5.9 oz (86.4 kg)   BMI 28.95 kg/m  Lungs are clear to A&P cardiac examination essentially unremarkable with regular rate and rhythm. No dominant mass or nodularity is noted in either breast in 2 positions examined. Incision is well-healed. No axillary or supraclavicular adenopathy is appreciated. Cosmetic result is excellent. Well-developed well-nourished patient in NAD. HEENT reveals PERLA, EOMI, discs not visualized.  Oral cavity is clear. No oral mucosal lesions are identified. Neck is clear without evidence of cervical or supraclavicular adenopathy. Lungs are clear to A&P. Cardiac examination is essentially unremarkable with regular rate and rhythm without murmur rub or thrill. Abdomen is benign with no organomegaly or masses noted. Motor sensory and DTR levels are equal and symmetric  in the upper and lower extremities. Cranial nerves II through XII are grossly intact. Proprioception is intact. No peripheral adenopathy or edema is identified. No motor or sensory levels are noted. Crude visual fields are within normal range.  RADIOLOGY RESULTS: Mammograms are reviewed and compatible with the above-stated findings  PLAN: Present time patient is doing well with no evidence of disease. She continues on arimadex without side effect. Not really concerned about her breast tenderness I've told her over time this will also heal and is mostly scar tissue. Patient will see me back in 1 year for follow-up. She knows to call sooner with any concerns.  I would like to take this opportunity to thank you for allowing me to participate in the care of your patient.Noreene Filbert, MD

## 2017-06-01 ENCOUNTER — Telehealth: Payer: Self-pay | Admitting: Family Medicine

## 2017-06-01 MED ORDER — DOXYCYCLINE HYCLATE 100 MG PO TABS: 100.0000 mg | ORAL_TABLET | Freq: Two times a day (BID) | ORAL | 0 refills | Status: DC

## 2017-06-01 NOTE — Telephone Encounter (Signed)
Copied from [place] [id number]. Topic: Quick Communication - See Telephone Encounter >> Jun 01, 2017 12:31 PM Aurelio Brash B wrote: CRM for notification. See Telephone encounter for: 06/01/17. PT states she has a skin condition that flares up  under her arms and private areas  and Dr Glori Bickers prescribes  her antibiotic  for  this and she would like this antibiotic sent to  [place], [place] [phone] (Phone) [phone] (Fax)

## 2017-06-01 NOTE — Telephone Encounter (Signed)
Left message with pt's spouse letting him know Rx was sent to pharmacy

## 2017-06-01 NOTE — Telephone Encounter (Signed)
She has hydradenitis and occ needs abx for flare  I will send doxycycline to her pharmacy  F/u if no imp or if she thinks she has an abscess that is not improving

## 2017-06-02 ENCOUNTER — Ambulatory Visit (INDEPENDENT_AMBULATORY_CARE_PROVIDER_SITE_OTHER): Payer: Medicare Other | Admitting: *Deleted

## 2017-06-02 DIAGNOSIS — I442 Atrioventricular block, complete: Secondary | ICD-10-CM

## 2017-06-02 NOTE — Progress Notes (Signed)
Remote pacemaker transmission.   

## 2017-06-04 ENCOUNTER — Encounter: Payer: Self-pay | Admitting: Cardiology

## 2017-06-05 ENCOUNTER — Other Ambulatory Visit: Payer: Self-pay | Admitting: Internal Medicine

## 2017-06-30 LAB — CUP PACEART REMOTE DEVICE CHECK
Battery Remaining Longevity: 30 mo
Battery Remaining Percentage: 48 %
Brady Statistic RA Percent Paced: 3 %
Date Time Interrogation Session: 20190409053000
Implantable Lead Location: 753859
Implantable Lead Location: 753860
Implantable Lead Model: 4456
Implantable Lead Model: 4479
Implantable Pulse Generator Implant Date: 20130830
Lead Channel Impedance Value: 391 Ohm
Lead Channel Impedance Value: 459 Ohm
Lead Channel Setting Pacing Amplitude: 2 V
Lead Channel Setting Sensing Sensitivity: 2.5 mV
MDC IDC LEAD IMPLANT DT: 20130830
MDC IDC LEAD IMPLANT DT: 20130830
MDC IDC LEAD SERIAL: 473325
MDC IDC LEAD SERIAL: 523784
MDC IDC MSMT LEADCHNL RA PACING THRESHOLD AMPLITUDE: 0.5 V
MDC IDC MSMT LEADCHNL RA PACING THRESHOLD PULSEWIDTH: 0.5 ms
MDC IDC SET LEADCHNL RV PACING AMPLITUDE: 2.4 V
MDC IDC SET LEADCHNL RV PACING PULSEWIDTH: 0.5 ms
MDC IDC STAT BRADY RV PERCENT PACED: 100 %
Pulse Gen Serial Number: 134132

## 2017-07-13 ENCOUNTER — Ambulatory Visit
Admission: RE | Admit: 2017-07-13 | Discharge: 2017-07-13 | Disposition: A | Discharge status: Home / Self Care | Payer: Medicare Other | Source: Ambulatory Visit | Attending: Family Medicine | Admitting: Family Medicine

## 2017-07-13 ENCOUNTER — Other Ambulatory Visit: Payer: Self-pay

## 2017-07-13 ENCOUNTER — Encounter: Payer: Self-pay | Admitting: *Deleted

## 2017-07-13 ENCOUNTER — Other Ambulatory Visit: Payer: Self-pay | Admitting: *Deleted

## 2017-07-13 ENCOUNTER — Encounter: Payer: Self-pay | Admitting: Family Medicine

## 2017-07-13 ENCOUNTER — Ambulatory Visit (INDEPENDENT_AMBULATORY_CARE_PROVIDER_SITE_OTHER): Payer: Medicare Other | Admitting: Family Medicine

## 2017-07-13 VITALS — BP 110/64 | HR 71 | Temp 97.9°F | Ht 68.0 in | Wt 186.5 lb

## 2017-07-13 DIAGNOSIS — M79662 Pain in left lower leg: Secondary | ICD-10-CM | POA: Diagnosis not present

## 2017-07-13 DIAGNOSIS — M79605 Pain in left leg: Secondary | ICD-10-CM | POA: Diagnosis not present

## 2017-07-13 DIAGNOSIS — X501XXA Overexertion from prolonged static or awkward postures, initial encounter: Secondary | ICD-10-CM | POA: Diagnosis not present

## 2017-07-13 DIAGNOSIS — S8992XA Unspecified injury of left lower leg, initial encounter: Secondary | ICD-10-CM | POA: Diagnosis not present

## 2017-07-13 DIAGNOSIS — M25562 Pain in left knee: Secondary | ICD-10-CM

## 2017-07-13 DIAGNOSIS — I70202 Unspecified atherosclerosis of native arteries of extremities, left leg: Secondary | ICD-10-CM | POA: Insufficient documentation

## 2017-07-13 DIAGNOSIS — M25462 Effusion, left knee: Secondary | ICD-10-CM | POA: Insufficient documentation

## 2017-07-13 NOTE — Progress Notes (Signed)
Dr. Frederico Hamman T. Dietra Stokely, MD, [facility] Sports Medicine [place], [address] Phone: [phone] Fax: [phone]  07/13/2017  Patient: Diana Cook, MRN: [id number], DOB: January 07, 1951, 67 y.o.  Primary Physician:  Tower, Wynelle Fanny, MD   Chief Complaint  Patient presents with  . Knee Pain    Twisted Leg a week ago   Subjective:   Diana Cook is a 67 y.o. very pleasant female patient who presents with the following:  Moving and going up the back steps and turned her whole body around and had some severe pain. Able to be bearable and will hurt a lot in the bed.  She has had a mild effusion, and she is able to ambulate, but she is having significant pain with bearing weight on that leg.  She is not using a cane or any kind of assistive device currently.  She has not had any significant prior trauma, fracture, or operative intervention in the affected leg.  She is having some pain in the proximal tibia as well.  10 days or so - still trying to unpack everything.   Past Medical History, Surgical History, Social History, Family History, Problem List, Medications, and Allergies have been reviewed and updated if relevant.  Patient Active Problem List   Diagnosis Date Noted  . Aspiration pneumonia ([place]) 10/07/2016  . Seizures ([place]) 10/01/2016  . Elevated glucose 08/31/2016  . Grief reaction 04/11/2016  . Malignant neoplasm of upper-outer quadrant of right breast in female, estrogen receptor positive ([place]) 12/11/2015  . Osteopenia 11/22/2015  . Estrogen deficiency 09/07/2015  . Bruising 09/07/2015  . Wrinkles 09/07/2015  . Ringworm of body 05/22/2015  . Post herpetic neuralgia 09/01/2014  . Encounter for Medicare annual wellness exam 07/11/2014  . Routine general medical examination at a health care facility 06/26/2013  . H/O small bowel obstruction 04/15/2013  . Dyspnea on exertion 02/10/2013  . Complete heart block ([place]) 11/03/2011  .  [employer] 11/03/2011  . Lumbar spinal stenosis 09/02/2011  . POSTMENOPAUSAL STATUS 08/06/2009  . HIDRADENITIS SUPPURATIVA 06/26/2008  . HERNIATED DISC 12/14/2007  . BACK, LOWER, PAIN 12/02/2007  . HX, PERSONAL, COLONIC POLYPS 09/14/2006  . Former smoker 06/10/2006  . ASTHMA 06/10/2006  . H/O idiopathic seizure 06/10/2006    Past Medical History:  Diagnosis Date  . Allergic rhinitis   . Arthritis   . Asthma    as a child, mild now  . Breast cancer ([place]) 01/06/2016   right breast cancer  . Cancer [facility])    breast  . Colon polyps    colonoscopy 7/08, tubular adenoma  . Complete heart block ([place]) 8/13   s/p PPM implanted in [facility]  . COPD (chronic obstructive pulmonary disease) ([place])   . Myocardial infarction ([place]) 2011  . Osteopenia 10/2015  . Pacemaker    2011  . Personal history of radiation therapy   . Seizure disorder ([place])   . Seizures ([place])    first one was when she was 67 years old   . Small bowel obstruction ([place])    1988 and 2002  . Tobacco abuse     Past Surgical History:  Procedure Laterality Date  . ABDOMINAL HYSTERECTOMY    . BREAST BIOPSY Right 11/2005   benign inflammatory changes, mass due to underwire bra  . BREAST BIOPSY Left 01/02/2016   columnar cell changes without atypical hyperplasia.  Marland Kitchen BREAST EXCISIONAL BIOPSY Left 01/06/2016   hyperplasia  .  BREAST LUMPECTOMY Right 01/06/2016  . BREAST LUMPECTOMY WITH SENTINEL LYMPH NODE BIOPSY Right 01/02/2016   Procedure: BREAST LUMPECTOMY WITH SENTINEL LYMPH NODE BX;  Surgeon: [name], MD;  Location: [facility] ORS;  Service: General;  Laterality: Right;  . CATARACT EXTRACTION Bilateral   . COLONOSCOPY  10/2015   Dr Ardis Hughs  . EXPLORATORY LAPAROTOMY  01/25/2001   Exploratory laparotomy, lysis of adhesions, identification of internal hernia secondary to omental adhesion. Prolonged postoperative ileus.  Marland Kitchen gyn surgery  1993   hysterectomy- form endometriosis  . LAPAROSCOPY      . PACEMAKER INSERTION  10/24/11   [employer] dual chamber PPM implanted by Dr Bunnie Philips at [facility] in [place] History  . Marital status: Married    Spouse name: Not on file  . Number of children: Not on file  . Years of education: Not on file  . Highest education level: Not on file  Occupational History    Employer: RETIRED  Social Needs  . Financial resource strain: Not on file  . Food insecurity:    Worry: Not on file    Inability: Not on file  . Transportation needs:    Medical: Not on file    Non-medical: Not on file  Tobacco Use  . Smoking status: Former Smoker    Packs/day: 0.40    Years: 40.00    Pack years: 16.00    Types: Cigarettes    Last attempt to quit: 11/11/2010    Years since quitting: 6.6  . Smokeless tobacco: Never Used  Substance and Sexual Activity  . Alcohol use: Yes    Alcohol/week: 2.4 oz    Types: 4 Glasses of wine per week    Comment: wine (pt said it's very occ)  . Drug use: No  . Sexual activity: Never  Lifestyle  . Physical activity:    Days per week: Not on file    Minutes per session: Not on file  . Stress: Not on file  Relationships  . Social connections:    Talks on phone: Not on file    Gets together: Not on file    Attends religious service: Not on file    Active member of club or organization: Not on file    Attends meetings of clubs or organizations: Not on file    Relationship status: Not on file  . Intimate partner violence:    Fear of current or ex partner: Not on file    Emotionally abused: Not on file    Physically abused: Not on file    Forced sexual activity: Not on file  Other Topics Concern  . Not on file  Social History Narrative   Married 40 years.   Exercises, does walking tapes    Family History  Problem Relation Age of Onset  . Stroke Mother   . Heart disease Mother        MI  . Dementia Mother   . Coronary artery disease  Father   . Parkinsonism Father   . Cancer - Cervical Daughter 19       died 03-15-22  . Colon cancer Neg Hx   . Breast cancer Neg Hx     Allergies  Allergen Reactions  . Codeine Nausea And Vomiting    REACTION: nausea  . Morphine And Related Nausea Only    Nausea     Medication list reviewed and updated in full in [facility].  GEN: No fevers, chills. Nontoxic. Primarily MSK c/o today. MSK: Detailed in the HPI GI: tolerating PO intake without difficulty Neuro: No numbness, parasthesias, or tingling associated. Otherwise the pertinent positives of the ROS are noted above.   Objective:   BP 110/64   Pulse 71   Temp 97.9 F (36.6 C) (Oral)   Ht 5\' 8"  (1.727 m)   Wt 186 lb 8 oz (84.6 kg)   BMI 28.36 kg/m    GEN: WDWN, NAD, Non-toxic, Alert & Oriented x 3 HEENT: Atraumatic, Normocephalic.  Ears and Nose: No external deformity. EXTR: No clubbing/cyanosis/edema NEURO: antalgic gait  PSYCH: Normally interactive. Conversant. Not depressed or anxious appearing.  Calm demeanor.    Patient has  no significant tenderness with palpation of the patella.  Patella moves freely without significant facet tenderness.  She has a mild effusion.  Range of motion is limited to a lack of 3 degrees of extension and flexion to approximately 90.  She has some tenderness on the medial joint line and minimal on the lateral.  She also has some significant tenderness at the tibial plateau and the proximal tibia.  Fibula is nontender.  The length of the tibia and fibula shaft more distally and middle are completely normal and nontender.  The MCL, LCL, ACL, and PCL appear to be intact.  Radiology: Dg Tibia/fibula Left  Result Date: 07/13/2017 CLINICAL DATA:  Pain following twisting injury EXAM: LEFT TIBIA AND FIBULA - 2 VIEW COMPARISON:  None. FINDINGS: Frontal and lateral views obtained. There is no fracture or dislocation. No abnormal periosteal reaction. There is narrowing of the medial  compartment of the knee joint. No appreciable ankle arthropathy. There is atherosclerotic calcification in the popliteal artery. IMPRESSION: No fracture or dislocation. 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 Electronically Signed   By: Lowella Grip III M.D.   On: 07/13/2017 14:14   Dg Knee Complete 4 Views Left  Result Date: 07/13/2017 CLINICAL DATA:  Pain following twisting injury EXAM: LEFT KNEE - COMPLETE 4+ VIEW COMPARISON:  None. FINDINGS: Frontal, lateral, and bilateral oblique views were obtained. There is no evident fracture or dislocation. There is a moderate joint effusion. There is mild narrowing medially. Other joint spaces appear unremarkable. No erosive change. There is calcification in the popliteal artery. IMPRESSION: Moderate joint effusion. No fracture or dislocation. Narrowing medially consistent with a degree of osteoarthritic change. 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 Electronically Signed   By: Lowella Grip III M.D.   On: 07/13/2017 14:13     Assessment and Plan:   Left leg pain - Plan: DG Knee Complete 4 Views Left, DG Tibia/Fibula Left  Acute pain of left knee - Plan: DG Knee Complete 4 Views Left, DG Tibia/Fibula Left  Plain radiographs at 10 days out are grossly normal with some mild osteoarthritic changes.  Given the maximal tenderness at the proximal tibia and tibial plateau, tibial plateau fracture versus insufficiency fracture cannot fully be excluded.  I placed the patient in a knee immobilizer, recommended that she obtain a walker to help take weight off of her leg, and would like to reevaluate her in approximately 7 days.  She understands that she should not wear her knee immobilizer at all times and should gently move her knee at least several times per day.  Follow-up: 7 days  Orders Placed This Encounter  Procedures  . DG Knee Complete 4 Views Left  . DG Tibia/Fibula Left  Signed,  Maud Deed. Delorse Shane, MD   Allergies as of 07/13/2017      Reactions   Codeine Nausea And Vomiting   REACTION: nausea   Morphine And Related Nausea Only   Nausea       Medication List        Accurate as of 07/13/17 11:59 PM. Always use your most recent med list.          acetaminophen 500 MG tablet Commonly known as:  TYLENOL Take 500 mg by mouth every 6 (six) hours as needed.   anastrozole 1 MG tablet Commonly known as:  ARIMIDEX Take 1 tablet (1 mg total) by mouth daily.   Calcium-Vitamin D-Vitamin K 759-163-84 MG-UNT-MCG Tabs Take 2 tablets by mouth daily.   FLUoxetine 20 MG capsule Commonly known as:  PROZAC Take 1 capsule (20 mg total) by mouth daily.   furosemide 20 MG tablet Commonly known as:  LASIX TAKE 1 TABLET BY MOUTH ONCE DAILY AS NEEDED   lamoTRIgine 150 MG tablet Commonly known as:  LAMICTAL   Melatonin 5 MG Chew Chew by mouth daily.   phenytoin 100 MG ER capsule Commonly known as:  DILANTIN Take 3 capsules (300 mg total) by mouth at bedtime.   zolpidem 10 MG tablet Commonly known as:  AMBIEN TAKE 1/2 TO 1 (ONE-HALF TO ONE) TABLET BY MOUTH AT BEDTIME AS NEEDED FOR SLEEP

## 2017-07-13 NOTE — Patient Instructions (Signed)
[address], [address], [place], [place] [address]: [address] the [address]

## 2017-07-14 ENCOUNTER — Encounter: Payer: Self-pay | Admitting: Family Medicine

## 2017-07-14 ENCOUNTER — Telehealth: Payer: Self-pay | Admitting: Family Medicine

## 2017-07-14 NOTE — Telephone Encounter (Signed)
Appointment 5/29 @ 2:20 pt aware

## 2017-07-14 NOTE — Telephone Encounter (Signed)
I had wanted the patient to follow-up with me in about a week, but we did not set up yesterday.   With memorial day, f/u with me next Wed. Would be ideal.   Thanks!

## 2017-07-22 ENCOUNTER — Encounter: Payer: Self-pay | Admitting: Family Medicine

## 2017-07-22 ENCOUNTER — Ambulatory Visit
Admission: RE | Admit: 2017-07-22 | Discharge: 2017-07-22 | Disposition: A | Discharge status: Home / Self Care | Payer: Medicare Other | Source: Ambulatory Visit | Attending: Family Medicine | Admitting: Family Medicine

## 2017-07-22 ENCOUNTER — Ambulatory Visit (INDEPENDENT_AMBULATORY_CARE_PROVIDER_SITE_OTHER): Payer: Medicare Other | Admitting: Family Medicine

## 2017-07-22 VITALS — BP 118/70 | HR 79 | Temp 97.6°F | Ht 68.0 in | Wt 186.2 lb

## 2017-07-22 DIAGNOSIS — M25562 Pain in left knee: Secondary | ICD-10-CM

## 2017-07-22 DIAGNOSIS — M25462 Effusion, left knee: Secondary | ICD-10-CM | POA: Diagnosis not present

## 2017-07-22 NOTE — Progress Notes (Signed)
Dr. Frederico Hamman T. Dametri Ozburn, MD, [facility] [place], [address] Phone: [phone] Fax: [phone]  07/22/2017  Patient: Diana Cook, MRN: [id number], DOB: [birth year], 67 y.o.  Primary Physician:  Tower, Wynelle Fanny, MD   Chief Complaint  Patient presents with  . Follow-up    Left leg pain   Subjective:   Diana Cook is a 67 y.o. very pleasant female patient who presents with the following:  F/u L leg pain: seen last week.   When I saw her, at that point she was about 10 days after her initial injury, and she twisted her leg and knee when she partially fell down her back stairs.  At that point she was able to bear weight, but it was quite painful, and she has had an effusion, and I placed her in a knee immobilizer.  She is here today for recheck.  At that time, she had normal plain films of her knee with some mild arthritic changes only.  She is here today, and she says she is slightly better, but her husband disagrees and thinks that she is really not any better at all, she is having difficulty walking and ambulating and she is still having some swelling in her knee.  Past Medical History, Surgical History, Social History, Family History, Problem List, Medications, and Allergies have been reviewed and updated if relevant.  Patient Active Problem List   Diagnosis Date Noted  . Aspiration pneumonia ([place]) 10/07/2016  . Seizures ([place]) 10/01/2016  . Elevated glucose 08/31/2016  . Grief reaction 04/11/2016  . Malignant neoplasm of upper-outer quadrant of right breast in female, estrogen receptor positive ([place]) 12/11/2015  . Osteopenia 11/22/2015  . Estrogen deficiency 09/07/2015  . Bruising 09/07/2015  . Wrinkles 09/07/2015  . Ringworm of body 05/22/2015  . Post herpetic neuralgia 09/01/2014  . Encounter for Medicare annual wellness exam 07/11/2014  . Routine general medical examination at a health care facility  06/26/2013  . H/O small bowel obstruction 04/15/2013  . Dyspnea on exertion 02/10/2013  . Complete heart block ([place]) 11/03/2011  . [employer] 11/03/2011  . Lumbar spinal stenosis 09/02/2011  . POSTMENOPAUSAL STATUS 08/06/2009  . HIDRADENITIS SUPPURATIVA 06/26/2008  . HERNIATED DISC 12/14/2007  . BACK, LOWER, PAIN 12/02/2007  . HX, PERSONAL, COLONIC POLYPS 09/14/2006  . Former smoker 06/10/2006  . ASTHMA 06/10/2006  . H/O idiopathic seizure 06/10/2006    Past Medical History:  Diagnosis Date  . Allergic rhinitis   . Arthritis   . Asthma    as a child, mild now  . Breast cancer ([place]) 01/06/2016   right breast cancer  . Cancer [facility])    breast  . Colon polyps    colonoscopy 7/08, tubular adenoma  . Complete heart block ([place]) 8/13   s/p PPM implanted in [facility]  . COPD (chronic obstructive pulmonary disease) ([place])   . Myocardial infarction ([place]) 2011  . Osteopenia 10/2015  . Pacemaker    2011  . Personal history of radiation therapy   . Seizure disorder ([place])   . Seizures ([place])    first one was when she was 67 years old   . Small bowel obstruction ([place])    1988 and 2002  . Tobacco abuse     Past Surgical History:  Procedure Laterality Date  . ABDOMINAL HYSTERECTOMY    . BREAST BIOPSY Right 11/2005   benign inflammatory changes, mass due to underwire bra  .  BREAST BIOPSY Left 01/02/2016   columnar cell changes without atypical hyperplasia.  Marland Kitchen BREAST EXCISIONAL BIOPSY Left 01/06/2016   hyperplasia  . BREAST LUMPECTOMY Right 01/06/2016  . BREAST LUMPECTOMY WITH SENTINEL LYMPH NODE BIOPSY Right 01/02/2016   Procedure: BREAST LUMPECTOMY WITH SENTINEL LYMPH NODE BX;  Surgeon: [name], MD;  Location: [facility] ORS;  Service: General;  Laterality: Right;  . CATARACT EXTRACTION Bilateral   . COLONOSCOPY  10/2015   Dr Ardis Hughs  . EXPLORATORY LAPAROTOMY  01/25/2001   Exploratory laparotomy, lysis of adhesions, identification of internal hernia  secondary to omental adhesion. Prolonged postoperative ileus.  Marland Kitchen gyn surgery  1993   hysterectomy- form endometriosis  . LAPAROSCOPY    . PACEMAKER INSERTION  10/24/11   [employer] Advantio dual chamber PPM implanted by Dr Bunnie Philips at [facility] in [place] History  . Marital status: Married    Spouse name: Not on file  . Number of children: Not on file  . Years of education: Not on file  . Highest education level: Not on file  Occupational History    Employer: RETIRED  Social Needs  . Financial resource strain: Not on file  . Food insecurity:    Worry: Not on file    Inability: Not on file  . Transportation needs:    Medical: Not on file    Non-medical: Not on file  Tobacco Use  . Smoking status: Former Smoker    Packs/day: 0.40    Years: 40.00    Pack years: 16.00    Types: Cigarettes    Last attempt to quit: 11/11/2010    Years since quitting: 6.6  . Smokeless tobacco: Never Used  Substance and Sexual Activity  . Alcohol use: Yes    Alcohol/week: 2.4 oz    Types: 4 Glasses of wine per week    Comment: wine (pt said it's very occ)  . Drug use: No  . Sexual activity: Never  Lifestyle  . Physical activity:    Days per week: Not on file    Minutes per session: Not on file  . Stress: Not on file  Relationships  . Social connections:    Talks on phone: Not on file    Gets together: Not on file    Attends religious service: Not on file    Active member of club or organization: Not on file    Attends meetings of clubs or organizations: Not on file    Relationship status: Not on file  . Intimate partner violence:    Fear of current or ex partner: Not on file    Emotionally abused: Not on file    Physically abused: Not on file    Forced sexual activity: Not on file  Other Topics Concern  . Not on file  Social History Narrative   Married 40 years.   Exercises, does walking tapes    Family History    Problem Relation Age of Onset  . Stroke Mother   . Heart disease Mother        MI  . Dementia Mother   . Coronary artery disease Father   . Parkinsonism Father   . Cancer - Cervical Daughter 38       died 03-31-2022  . Colon cancer Neg Hx   . Breast cancer Neg Hx     Allergies  Allergen Reactions  . Codeine Nausea And Vomiting    REACTION: nausea  .  Morphine And Related Nausea Only    Nausea     Medication list reviewed and updated in full in [place].  GEN: No fevers, chills. Nontoxic. Primarily MSK c/o today. MSK: Detailed in the HPI GI: tolerating PO intake without difficulty Neuro: No numbness, parasthesias, or tingling associated. Otherwise the pertinent positives of the ROS are noted above.   Objective:   BP 118/70   Pulse 79   Temp 97.6 F (36.4 C) (Oral)   Ht 5\' 8"  (1.727 m)   Wt 186 lb 4 oz (84.5 kg)   BMI 28.32 kg/m    GEN: WDWN, NAD, Non-toxic, Alert & Oriented x 3 HEENT: Atraumatic, Normocephalic.  Ears and Nose: No external deformity. EXTR: No clubbing/cyanosis/edema NEURO: antalgic gait.  PSYCH: Normally interactive. Conversant. Not depressed or anxious appearing.  Calm demeanor.    In the left knee, the patient still has a large ballotable effusion.  This may be increased compared to prior week.  She has full extension and flexion to 95 degrees.  She has notable tenderness along the medial joint line.  Minor tenderness along the lateral joint line.  MCL and LCL appear stable.  She also has some tenderness along the proximal tibia, proximal fibula, and the tibial plateau.  Radiology: Dg Tibia/fibula Left  Result Date: 07/13/2017 CLINICAL DATA:  Pain following twisting injury EXAM: LEFT TIBIA AND FIBULA - 2 VIEW COMPARISON:  None. FINDINGS: Frontal and lateral views obtained. There is no fracture or dislocation. No abnormal periosteal reaction. There is narrowing of the medial compartment of the knee joint. No appreciable ankle arthropathy. There  is atherosclerotic calcification in the popliteal artery. IMPRESSION: No fracture or dislocation. 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 Electronically Signed   By: Lowella Grip III M.D.   On: 07/13/2017 14:14   Ct Knee Left Wo Contrast  Result Date: 07/22/2017 CLINICAL DATA:  Acute medial left knee pain since an injury on 07/02/2017. EXAM: CT OF THE LEFT KNEE WITHOUT CONTRAST TECHNIQUE: Multidetector CT imaging of the left knee was performed according to the standard protocol. Multiplanar CT image reconstructions were also generated. COMPARISON:  Radiographs dated 07/13/2017 FINDINGS: Bones/Joint/Cartilage There is no fracture or dislocation. There is medial joint space narrowing with tiny tricompartmental marginal osteophytes. Prominent joint effusion. No Baker's cyst. Ligaments Suboptimally assessed by CT. Cruciate and collateral ligaments appear to be intact. Muscles and Tendons Distal quadriceps tendon and patellar tendon are intact. Soft tissues The adjacent soft tissues are normal. Menisci are not well enough seen for evaluation on this CT scan. IMPRESSION: 1. Large joint effusion. 2. Slight arthritic changes of the knee primarily in the medial compartment. 3. No fractures. 4. I cannot exclude meniscal tears. Electronically Signed   By: Lorriane Shire M.D.   On: 07/22/2017 16:07   Dg Knee Complete 4 Views Left  Result Date: 07/13/2017 CLINICAL DATA:  Pain following twisting injury EXAM: LEFT KNEE - COMPLETE 4+ VIEW COMPARISON:  None. FINDINGS: Frontal, lateral, and bilateral oblique views were obtained. There is no evident fracture or dislocation. There is a moderate joint effusion. There is mild narrowing medially. Other joint spaces appear unremarkable. No erosive change. There is calcification in the popliteal artery. IMPRESSION: Moderate joint effusion. No fracture or dislocation. Narrowing medially consistent with a degree of osteoarthritic  change. 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 Electronically Signed   By: Lowella Grip III M.D.   On: 07/13/2017 14:13     Assessment and Plan:  Acute pain of left knee - Plan: CT KNEE LEFT WO CONTRAST  Effusion of left knee joint - Plan: CT KNEE LEFT WO CONTRAST  Continued significant knee pain, large effusion, pain at the proximal tibia as well as proximal fibula.  The patient has a pacemaker, and MRI is contraindicated.  Obtain a CT of the left knee without contrast to evaluate for potential occult tibial plateau fracture, proximal fibular fracture, or tibial plateau insufficiency fracture.  At this time results are in, and no fractures are apparent.  Follow-up: No follow-ups on file.  Orders Placed This Encounter  Procedures  . CT KNEE LEFT WO CONTRAST    Signed,  Myliyah Rebuck T. Elwyn Lowden, MD   Allergies as of 07/22/2017      Reactions   Codeine Nausea And Vomiting   REACTION: nausea   Morphine And Related Nausea Only   Nausea       Medication List        Accurate as of 07/22/17 11:59 PM. Always use your most recent med list.          acetaminophen 500 MG tablet Commonly known as:  TYLENOL Take 500 mg by mouth every 6 (six) hours as needed.   anastrozole 1 MG tablet Commonly known as:  ARIMIDEX Take 1 tablet (1 mg total) by mouth daily.   Calcium-Vitamin D-Vitamin K 324-401-02 MG-UNT-MCG Tabs Take 2 tablets by mouth daily.   FLUoxetine 20 MG capsule Commonly known as:  PROZAC Take 1 capsule (20 mg total) by mouth daily.   furosemide 20 MG tablet Commonly known as:  LASIX TAKE 1 TABLET BY MOUTH ONCE DAILY AS NEEDED   lamoTRIgine 150 MG tablet Commonly known as:  LAMICTAL   Melatonin 5 MG Chew Chew by mouth daily.   phenytoin 100 MG ER capsule Commonly known as:  DILANTIN Take 3 capsules (300 mg total) by mouth at bedtime.   zolpidem 10 MG tablet Commonly known as:  AMBIEN TAKE 1/2 TO 1 (ONE-HALF TO ONE) TABLET BY MOUTH AT BEDTIME AS NEEDED  FOR SLEEP

## 2017-07-22 NOTE — Patient Instructions (Signed)
REFERRALS TO SPECIALISTS, SPECIAL TESTS (MRI, CT, ULTRASOUNDS)  [facility] or  Anastasiya will help you. ASK CHECK-IN FOR HELP.  Specialist appointment times vary a great deal, based on their schedule / openings. -- Some specialists have very long wait times. (Example. Dermatology)    

## 2017-07-23 ENCOUNTER — Ambulatory Visit (INDEPENDENT_AMBULATORY_CARE_PROVIDER_SITE_OTHER): Payer: Medicare Other | Admitting: Family Medicine

## 2017-07-23 ENCOUNTER — Encounter: Payer: Self-pay | Admitting: Family Medicine

## 2017-07-23 VITALS — BP 110/74 | HR 81 | Temp 97.9°F | Ht 68.0 in | Wt 186.0 lb

## 2017-07-23 DIAGNOSIS — M25462 Effusion, left knee: Secondary | ICD-10-CM

## 2017-07-23 DIAGNOSIS — M25562 Pain in left knee: Secondary | ICD-10-CM | POA: Diagnosis not present

## 2017-07-23 MED ORDER — METHYLPREDNISOLONE ACETATE 40 MG/ML IJ SUSP
80.0000 mg | Freq: Once | INTRAMUSCULAR | Status: AC
  Administered 2017-07-23: 80 mg via INTRA_ARTICULAR

## 2017-07-23 NOTE — Progress Notes (Signed)
   Dr. Frederico Hamman T. Brayen Bunn, MD, [facility] Sports Medicine [place], [address] Phone: [phone] Fax: [phone]  07/23/2017  Patient: Diana Cook, MRN: [id number], DOB: 1950-10-09, 67 y.o.  Primary Physician:  Tower, Wynelle Fanny, MD   Chief Complaint  Patient presents with  . Knee Pain    Aspirate and Inject Left Knee    F/u L knee, we reviewed her CT and I am going to aspirate her L knee.   Ct Knee Left Wo Contrast  Result Date: 07/22/2017 CLINICAL DATA:  Acute medial left knee pain since an injury on 07/02/2017. EXAM: CT OF THE LEFT KNEE WITHOUT CONTRAST TECHNIQUE: Multidetector CT imaging of the left knee was performed according to the standard protocol. Multiplanar CT image reconstructions were also generated. COMPARISON:  Radiographs dated 07/13/2017 FINDINGS: Bones/Joint/Cartilage There is no fracture or dislocation. There is medial joint space narrowing with tiny tricompartmental marginal osteophytes. Prominent joint effusion. No Baker's cyst. Ligaments Suboptimally assessed by CT. Cruciate and collateral ligaments appear to be intact. Muscles and Tendons Distal quadriceps tendon and patellar tendon are intact. Soft tissues The adjacent soft tissues are normal. Menisci are not well enough seen for evaluation on this CT scan. IMPRESSION: 1. Large joint effusion. 2. Slight arthritic changes of the knee primarily in the medial compartment. 3. No fractures. 4. I cannot exclude meniscal tears. Electronically Signed   By: Lorriane Shire M.D.   On: 07/22/2017 16:07   Effusion of knee joint, left - Plan: methylPREDNISolone acetate (DEPO-MEDROL) injection 80 mg  Acute pain of left knee - Plan: methylPREDNISolone acetate (DEPO-MEDROL) injection 80 mg   Most likely meniscal tear.   Knee Aspiration and Injection, L Patient verbally consented; risks, benefits, and alternatives explained including possible infection. Patient prepped with Chloraprep.  Ethyl chloride for anesthesia. 10 cc of 1% Lidocaine used in wheal then injected Subcutaneous fashion with 22 gauge needle on lateral approach. Under sterilne conditions, 18 gauge needle used via lateral approach to aspirate 35 cc of yellowish synovial fluid. Then 8 cc of Lidocaine 1% and 2 mL of Depo-Medrol 40 mg injected. Tolerated well, decreased pain, no complications.   Signed,  Maud Deed. Kambri Dismore, MD

## 2017-07-30 ENCOUNTER — Telehealth: Payer: Self-pay | Admitting: Internal Medicine

## 2017-07-30 NOTE — Telephone Encounter (Signed)
Patient recently moved and has a new home number and is not sure if this effects home remote device .  She wants to make sure she is set up for upcoming remote check .  Please call to discuss.

## 2017-07-30 NOTE — Telephone Encounter (Signed)
Transmission received.

## 2017-07-30 NOTE — Telephone Encounter (Signed)
Patient calling in regards to remote check States she does not think it is working Please call to discuss

## 2017-07-30 NOTE — Telephone Encounter (Signed)
Called pt to help her trouble shoot home monitor. Instructed the pt that she could not be on the phone while trying to send a transmission. Instructed her to try transmission again once we hanged up the phone. If she has the same problem she is to call me back. Pt verbalized understanding.

## 2017-07-30 NOTE — Telephone Encounter (Signed)
Informed patient that her monitor is not communicating. I asked her to send a manual transmission. Verbal instructions provided. Patient verbalized understanding.   Will call patient if her remote is not received.

## 2017-08-04 DIAGNOSIS — G4733 Obstructive sleep apnea (adult) (pediatric): Secondary | ICD-10-CM | POA: Diagnosis not present

## 2017-08-04 DIAGNOSIS — R569 Unspecified convulsions: Secondary | ICD-10-CM | POA: Diagnosis not present

## 2017-09-01 ENCOUNTER — Ambulatory Visit (INDEPENDENT_AMBULATORY_CARE_PROVIDER_SITE_OTHER): Payer: Medicare Other | Admitting: *Deleted

## 2017-09-01 DIAGNOSIS — I442 Atrioventricular block, complete: Secondary | ICD-10-CM | POA: Diagnosis not present

## 2017-09-01 NOTE — Progress Notes (Signed)
Remote pacemaker transmission.   

## 2017-09-02 ENCOUNTER — Ambulatory Visit (INDEPENDENT_AMBULATORY_CARE_PROVIDER_SITE_OTHER): Payer: Medicare Other | Admitting: Family Medicine

## 2017-09-02 ENCOUNTER — Encounter: Payer: Self-pay | Admitting: Family Medicine

## 2017-09-02 VITALS — BP 112/70 | HR 74 | Temp 97.9°F | Ht 68.0 in | Wt 192.0 lb

## 2017-09-02 DIAGNOSIS — M7050 Other bursitis of knee, unspecified knee: Secondary | ICD-10-CM | POA: Diagnosis not present

## 2017-09-02 DIAGNOSIS — M25562 Pain in left knee: Secondary | ICD-10-CM

## 2017-09-02 MED ORDER — METHYLPREDNISOLONE ACETATE 40 MG/ML IJ SUSP
40.0000 mg | Freq: Once | INTRAMUSCULAR | Status: AC
  Administered 2017-09-02: 40 mg via INTRA_ARTICULAR

## 2017-09-02 NOTE — Progress Notes (Signed)
Dr. Frederico Hamman T. Zaleigh Bermingham, MD, [facility] [place], [address] Phone: [phone] Fax: [phone]  09/02/2017  Patient: Diana Cook, MRN: [id number], DOB: 1951/02/05, 67 y.o.  Primary Physician:  Tower, Wynelle Fanny, MD   Chief Complaint  Patient presents with  . Follow-up    Left Knee   Subjective:   Diana Cook is a 67 y.o. very pleasant female patient who presents with the following:  L knee - will be at the beach all summer at the beach.  I remember this patient I ended up doing a CT of her knee very well, and I saw her about 2 months ago, at that point she was doing quite poorly.  Given that she has a pacemaker, and this showed no evidence of fracture.  At that point I aspirated her knee and injected some corticosteroid.  Since then she is been relatively inactive, and her knee is done much better.  She still has some symptoms, but overall is doing quite better compared to before.  She still has some amount of pain with some twisting sensations.    She is having some minor pain at the joint line now, but she does still have some pain in the Pes anserine region.  Past Medical History, Surgical History, Social History, Family History, Problem List, Medications, and Allergies have been reviewed and updated if relevant.  Patient Active Problem List   Diagnosis Date Noted  . Aspiration pneumonia ([place]) 10/07/2016  . Seizures ([place]) 10/01/2016  . Elevated glucose 08/31/2016  . Grief reaction 04/11/2016  . Malignant neoplasm of upper-outer quadrant of right breast in female, estrogen receptor positive ([place]) 12/11/2015  . Osteopenia 11/22/2015  . Estrogen deficiency 09/07/2015  . Bruising 09/07/2015  . Wrinkles 09/07/2015  . Ringworm of body 05/22/2015  . Post herpetic neuralgia 09/01/2014  . Encounter for Medicare annual wellness exam 07/11/2014  . Routine general medical examination at a health care facility 06/26/2013    . H/O small bowel obstruction 04/15/2013  . Dyspnea on exertion 02/10/2013  . Complete heart block ([place]) 11/03/2011  . [employer] 11/03/2011  . Lumbar spinal stenosis 09/02/2011  . POSTMENOPAUSAL STATUS 08/06/2009  . HIDRADENITIS SUPPURATIVA 06/26/2008  . HERNIATED DISC 12/14/2007  . BACK, LOWER, PAIN 12/02/2007  . HX, PERSONAL, COLONIC POLYPS 09/14/2006  . Former smoker 06/10/2006  . ASTHMA 06/10/2006  . H/O idiopathic seizure 06/10/2006    Past Medical History:  Diagnosis Date  . Allergic rhinitis   . Arthritis   . Asthma    as a child, mild now  . Breast cancer ([place]) 01/06/2016   right breast cancer  . Cancer [facility])    breast  . Colon polyps    colonoscopy 7/08, tubular adenoma  . Complete heart block ([place]) 8/13   s/p PPM implanted in [facility]  . COPD (chronic obstructive pulmonary disease) ([place])   . Myocardial infarction ([place]) 2011  . Osteopenia 10/2015  . Pacemaker    2011  . Personal history of radiation therapy   . Seizure disorder ([place])   . Seizures ([place])    first one was when she was 67 years old   . Small bowel obstruction ([place])    1988 and 2002  . Tobacco abuse     Past Surgical History:  Procedure Laterality Date  . ABDOMINAL HYSTERECTOMY    . BREAST BIOPSY Right 11/2005   benign inflammatory changes, mass due to underwire  bra  . BREAST BIOPSY Left 01/02/2016   columnar cell changes without atypical hyperplasia.  Marland Kitchen BREAST EXCISIONAL BIOPSY Left 01/06/2016   hyperplasia  . BREAST LUMPECTOMY Right 01/06/2016  . BREAST LUMPECTOMY WITH SENTINEL LYMPH NODE BIOPSY Right 01/02/2016   Procedure: BREAST LUMPECTOMY WITH SENTINEL LYMPH NODE BX;  Surgeon: [name], MD;  Location: [facility] ORS;  Service: General;  Laterality: Right;  . CATARACT EXTRACTION Bilateral   . COLONOSCOPY  10/2015   Dr Ardis Hughs  . EXPLORATORY LAPAROTOMY  01/25/2001   Exploratory laparotomy, lysis of adhesions, identification of internal hernia secondary to  omental adhesion. Prolonged postoperative ileus.  Marland Kitchen gyn surgery  1993   hysterectomy- form endometriosis  . LAPAROSCOPY    . PACEMAKER INSERTION  10/24/11   [employer] Advantio dual chamber PPM implanted by Dr Bunnie Philips at [facility] in [place] History  . Marital status: Married    Spouse name: Not on file  . Number of children: Not on file  . Years of education: Not on file  . Highest education level: Not on file  Occupational History    Employer: RETIRED  Social Needs  . Financial resource strain: Not on file  . Food insecurity:    Worry: Not on file    Inability: Not on file  . Transportation needs:    Medical: Not on file    Non-medical: Not on file  Tobacco Use  . Smoking status: Former Smoker    Packs/day: 0.40    Years: 40.00    Pack years: 16.00    Types: Cigarettes    Last attempt to quit: 11/11/2010    Years since quitting: 6.8  . Smokeless tobacco: Never Used  Substance and Sexual Activity  . Alcohol use: Yes    Alcohol/week: 2.4 oz    Types: 4 Glasses of wine per week    Comment: wine (pt said it's very occ)  . Drug use: No  . Sexual activity: Never  Lifestyle  . Physical activity:    Days per week: Not on file    Minutes per session: Not on file  . Stress: Not on file  Relationships  . Social connections:    Talks on phone: Not on file    Gets together: Not on file    Attends religious service: Not on file    Active member of club or organization: Not on file    Attends meetings of clubs or organizations: Not on file    Relationship status: Not on file  . Intimate partner violence:    Fear of current or ex partner: Not on file    Emotionally abused: Not on file    Physically abused: Not on file    Forced sexual activity: Not on file  Other Topics Concern  . Not on file  Social History Narrative   Married 40 years.   Exercises, does walking tapes    Family History  Problem  Relation Age of Onset  . Stroke Mother   . Heart disease Mother        MI  . Dementia Mother   . Coronary artery disease Father   . Parkinsonism Father   . Cancer - Cervical Daughter 60       died 26-Mar-2022  . Colon cancer Neg Hx   . Breast cancer Neg Hx     Allergies  Allergen Reactions  . Codeine Nausea And Vomiting  REACTION: nausea  . Morphine And Related Nausea Only    Nausea     Medication list reviewed and updated in full in [place].  GEN: No fevers, chills. Nontoxic. Primarily MSK c/o today. MSK: Detailed in the HPI GI: tolerating PO intake without difficulty Neuro: No numbness, parasthesias, or tingling associated. Otherwise the pertinent positives of the ROS are noted above.   Objective:   BP 112/70   Pulse 74   Temp 97.9 F (36.6 C) (Oral)   Ht 5\' 8"  (1.727 m)   Wt 192 lb (87.1 kg)   BMI 29.19 kg/m    GEN: WDWN, NAD, Non-toxic, Alert & Oriented x 3 HEENT: Atraumatic, Normocephalic.  Ears and Nose: No external deformity. EXTR: No clubbing/cyanosis/edema NEURO: Normal gait.  PSYCH: Normally interactive. Conversant. Not depressed or anxious appearing.  Calm demeanor.    At the left knee the patient has full extension.  She has flexion to 122 degrees.  There is some minor effusion.  She has some mild medial joint line pain and to a lesser extent some mild lateral joint line pain.  She does have some tenderness at the anserine bursa.  Patellar tendon and quad tendon are nontender.  McMurray's and flexion pinch test appears negative today.  Radiology: Ct Knee Left Wo Contrast  Result Date: 07/22/2017 CLINICAL DATA:  Acute medial left knee pain since an injury on 07/02/2017. EXAM: CT OF THE LEFT KNEE WITHOUT CONTRAST TECHNIQUE: Multidetector CT imaging of the left knee was performed according to the standard protocol. Multiplanar CT image reconstructions were also generated. COMPARISON:  Radiographs dated 07/13/2017 FINDINGS: Bones/Joint/Cartilage There  is no fracture or dislocation. There is medial joint space narrowing with tiny tricompartmental marginal osteophytes. Prominent joint effusion. No Baker's cyst. Ligaments Suboptimally assessed by CT. Cruciate and collateral ligaments appear to be intact. Muscles and Tendons Distal quadriceps tendon and patellar tendon are intact. Soft tissues The adjacent soft tissues are normal. Menisci are not well enough seen for evaluation on this CT scan. IMPRESSION: 1. Large joint effusion. 2. Slight arthritic changes of the knee primarily in the medial compartment. 3. No fractures. 4. I cannot exclude meniscal tears. Electronically Signed   By: Lorriane Shire M.D.   On: 07/22/2017 16:07   Assessment and Plan:   Anserine bursitis - Plan: methylPREDNISolone acetate (DEPO-MEDROL) injection 40 mg  Left knee pain, unspecified chronicity  Her exam is much better today compared to prior exams at all.  I anticipate that she is going to do well, and we went over some basic rehab and exercise that she should start doing.  We are going to inject the pes bursa today to give her some relief prior to her beach trip.  Pes Anserine Bursitis Injection, L Verbal consent was obtained. Risks (including rare infection, skin lightening, and potential atrophy), benefits, and alternatives explained. Sterilely prepped with Chloraprep. Ethyl chloride for anesthesia. Under sterile conditions, 3/4 cc of Lidocaine 1% and 3/4 cc of Depo-Medrol 40 mg injected directly on the pes anserinus perpendicularly taking the needle to bone then slightly withdrawing. No resistance encountered. No complications with procedure and tolerated well. Patient had decreased pain post-injection. 22 gauge 1 1/2 inch needle   Follow-up: No follow-ups on file.  Meds ordered this encounter  Medications  . methylPREDNISolone acetate (DEPO-MEDROL) injection 40 mg   Signed,  Mataya Kilduff T. Stella Encarnacion, MD   Allergies as of 09/02/2017      Reactions   Codeine  Nausea And Vomiting   REACTION:  nausea   Morphine And Related Nausea Only   Nausea       Medication List        Accurate as of 09/02/17 11:59 PM. Always use your most recent med list.          acetaminophen 500 MG tablet Commonly known as:  TYLENOL Take 500 mg by mouth every 6 (six) hours as needed.   Calcium-Vitamin D-Vitamin K 594-707-61 MG-UNT-MCG Tabs Take 1 tablet by mouth 2 (two) times daily.   FLUoxetine 20 MG capsule Commonly known as:  PROZAC Take 1 capsule (20 mg total) by mouth daily.   furosemide 20 MG tablet Commonly known as:  LASIX TAKE 1 TABLET BY MOUTH ONCE DAILY AS NEEDED   lamoTRIgine 150 MG tablet Commonly known as:  LAMICTAL Take 150 mg by mouth 2 (two) times daily.   zolpidem 10 MG tablet Commonly known as:  AMBIEN TAKE 1/2 TO 1 (ONE-HALF TO ONE) TABLET BY MOUTH AT BEDTIME AS NEEDED FOR SLEEP

## 2017-09-08 ENCOUNTER — Other Ambulatory Visit: Payer: Self-pay | Admitting: Internal Medicine

## 2017-09-08 ENCOUNTER — Ambulatory Visit (INDEPENDENT_AMBULATORY_CARE_PROVIDER_SITE_OTHER): Payer: Medicare Other | Admitting: Internal Medicine

## 2017-09-08 ENCOUNTER — Encounter: Payer: Self-pay | Admitting: Internal Medicine

## 2017-09-08 VITALS — BP 118/70 | HR 60 | Ht 68.0 in | Wt 189.0 lb

## 2017-09-08 DIAGNOSIS — Z950 Presence of cardiac pacemaker: Secondary | ICD-10-CM | POA: Diagnosis not present

## 2017-09-08 DIAGNOSIS — R0600 Dyspnea, unspecified: Secondary | ICD-10-CM

## 2017-09-08 DIAGNOSIS — I442 Atrioventricular block, complete: Secondary | ICD-10-CM | POA: Diagnosis not present

## 2017-09-08 LAB — CUP PACEART INCLINIC DEVICE CHECK
Implantable Lead Implant Date: 20130830
Implantable Lead Location: 753859
Implantable Lead Location: 753860
Implantable Lead Model: 4456
Implantable Lead Model: 4479
Implantable Lead Serial Number: 473325
Lead Channel Impedance Value: 459 Ohm
Lead Channel Impedance Value: 515 Ohm
Lead Channel Pacing Threshold Amplitude: 0.5 V
Lead Channel Pacing Threshold Pulse Width: 0.5 ms
Lead Channel Setting Pacing Amplitude: 2 V
Lead Channel Setting Pacing Pulse Width: 0.5 ms
MDC IDC LEAD IMPLANT DT: 20130830
MDC IDC LEAD SERIAL: 523784
MDC IDC MSMT LEADCHNL RA PACING THRESHOLD AMPLITUDE: 0.5 V
MDC IDC MSMT LEADCHNL RA PACING THRESHOLD PULSEWIDTH: 0.5 ms
MDC IDC MSMT LEADCHNL RA SENSING INTR AMPL: 3.8 mV
MDC IDC PG IMPLANT DT: 20130830
MDC IDC SESS DTM: 20190716040000
MDC IDC SET LEADCHNL RV PACING AMPLITUDE: 2.4 V
MDC IDC SET LEADCHNL RV SENSING SENSITIVITY: 2.5 mV
Pulse Gen Serial Number: 134132

## 2017-09-08 NOTE — Patient Instructions (Addendum)
Medication Instructions: - Your physician recommends that you continue on your current medications as directed. Please refer to the Current Medication list given to you today.  Labwork: - none ordered  Procedures/Testing: - Your physician has requested that you have an echocardiogram. Echocardiography is a painless test that uses sound waves to create images of your heart. It provides your doctor with information about the size and shape of your heart and how well your heart's chambers and valves are working. This procedure takes approximately one hour. There are no restrictions for this procedure.  Follow-Up: - Remote monitoring is used to monitor your Pacemaker of ICD from home. This monitoring reduces the number of office visits required to check your device to one time per year. It allows [place] to keep an eye on the functioning of your device to ensure it is working properly. You are scheduled for a device check from home on 12/08/17. You may send your transmission at any time that day. If you have a wireless device, the transmission will be sent automatically. After your physician reviews your transmission, you will receive a postcard with your next transmission date.  - Your physician wants you to follow-up in: 1 year with Dr. Caryl Comes. You will receive a reminder letter in the mail two months in advance. If you don't receive a letter, please call our office to schedule the follow-up appointment.   Any Additional Special Instructions Will Be Listed Below (If Applicable). - consider salt supplements    If you need a refill on your cardiac medications before your next appointment, please call your pharmacy.

## 2017-09-08 NOTE — Progress Notes (Signed)
Patient Care Team: Tower, Wynelle Fanny, MD as PCP - General Deboraha Sprang, MD as Consulting Physician (Cardiology) Lorelee Cover., MD as Referring Physician (Ophthalmology) Tower, Wynelle Fanny, MD as Consulting Physician (Family Medicine) Bary Castilla Forest Gleason, MD (General Surgery)   HPI  Diana Cook is a 67 y.o. female Seen by both Drs. Allred and Lovena Le in the past who developed complete heart block and underwent pacing fall 2013 [facility] Catheterization reportedly demonstrated no coronary artery disease  Echocardiogram 9/13 demonstrated normal left ventricular ; this was repeated 9/14 with normal LV function  She has had more problems with dyspnea and some edema.  She is also on a chemotherapeutic agent following her breast cancer which was associate with significant weight gain.  She has since stopped. DATE TEST EF   9/13 Echo   55-65 %   12/14 Echo   55-65 %   8/18 Echo  55-60%       Her daughter a year ago developed cancer. Side effects from chemotherapy at left her bedbound for the last year She died 03/29/2022; devastating.  She meets with her grandchildren regularly for breakfast.     Date Cr K  8//18 0.83 4.4          Past Medical History:  Diagnosis Date  . Allergic rhinitis   . Arthritis   . Asthma    as a child, mild now  . Breast cancer ([place]) 01/06/2016   right breast cancer  . Cancer [facility])    breast  . Colon polyps    colonoscopy 7/08, tubular adenoma  . Complete heart block ([place]) 8/13   s/p PPM implanted in [facility]  . COPD (chronic obstructive pulmonary disease) ([place])   . Myocardial infarction ([place]) 2011  . Osteopenia 10/2015  . Pacemaker    2011  . Personal history of radiation therapy   . Seizure disorder ([place])   . Seizures ([place])    first one was when she was 67 years old   . Small bowel obstruction ([place])    1988 and 2002  . Tobacco abuse     Past Surgical History:  Procedure Laterality Date  . ABDOMINAL HYSTERECTOMY    .  BREAST BIOPSY Right 11/2005   benign inflammatory changes, mass due to underwire bra  . BREAST BIOPSY Left 01/02/2016   columnar cell changes without atypical hyperplasia.  Marland Kitchen BREAST EXCISIONAL BIOPSY Left 01/06/2016   hyperplasia  . BREAST LUMPECTOMY Right 01/06/2016  . BREAST LUMPECTOMY WITH SENTINEL LYMPH NODE BIOPSY Right 01/02/2016   Procedure: BREAST LUMPECTOMY WITH SENTINEL LYMPH NODE BX;  Surgeon: [name], MD;  Location: [facility] ORS;  Service: General;  Laterality: Right;  . CATARACT EXTRACTION Bilateral   . COLONOSCOPY  10/2015   Dr Ardis Hughs  . EXPLORATORY LAPAROTOMY  01/25/2001   Exploratory laparotomy, lysis of adhesions, identification of internal hernia secondary to omental adhesion. Prolonged postoperative ileus.  Marland Kitchen gyn surgery  1993   hysterectomy- form endometriosis  . LAPAROSCOPY    . PACEMAKER INSERTION  10/24/11   [employer] Advantio dual chamber PPM implanted by Dr Bunnie Philips at [facility] in [place]    Current Outpatient Medications  Medication Sig Dispense Refill  . acetaminophen (TYLENOL) 500 MG tablet Take 500 mg by mouth every 6 (six) hours as needed.    . Calcium-Vitamin D-Vitamin K 109-323-55 MG-UNT-MCG TABS Take 1 tablet by mouth 2 (two) times daily.     Marland Kitchen  FLUoxetine (PROZAC) 20 MG capsule Take 1 capsule (20 mg total) by mouth daily. 30 capsule 11  . furosemide (LASIX) 20 MG tablet TAKE 1 TABLET BY MOUTH ONCE DAILY AS NEEDED 90 tablet 0  . lamoTRIgine (LAMICTAL) 150 MG tablet Take 150 mg by mouth 2 (two) times daily.   5  . zolpidem (AMBIEN) 10 MG tablet TAKE 1/2 TO 1 (ONE-HALF TO ONE) TABLET BY MOUTH AT BEDTIME AS NEEDED FOR SLEEP 30 tablet 5   No current facility-administered medications for this visit.     Allergies  Allergen Reactions  . Codeine Nausea And Vomiting    REACTION: nausea  . Morphine And Related Nausea Only    Nausea     Review of Systems negative except from HPI and PMH  Physical Exam BP 118/70 (BP  Location: Left Arm, Patient Position: Sitting, Cuff Size: Normal)   Pulse 60   Ht 5\' 8"  (1.727 m)   Wt 189 lb (85.7 kg)   BMI 28.74 kg/m  Well developed and nourished in no acute distress HENT normal Neck supple with JVP-flat Clear Regular rate and rhythm, no murmurs or gallops Abd-soft with active BS No Clubbing cyanosis edema Skin-warm and dry A & Oriented  Grossly normal sensory and motor function   ECG  P-synchronous/ AV  pacing   Assessment and  Plan  High-grade heart block  Pacemaker-[employer] The patient's device was interrogated.  The information was reviewed. No changes were made in the programming.    Sinus node dysfunction and chronotropic incompetence   Exercise intolerance  Grief    She continues to struggle with dyspnea on exertion.  Because the incidence of pacemaker cardiomyopathy is stable about 3 %/year, we will recheck it.  She is euvolemic.  Heart rate excursion looks reasonable.  Significant interval weight gain (30-40 pounds over the last 2 years) this may be the more likely culprit as relates to dyspnea.  Discussed weight loss strategies and exercise  We spent more than 50% of our >25 min visit in face to face counseling regarding the above

## 2017-09-09 ENCOUNTER — Other Ambulatory Visit: Payer: Self-pay

## 2017-09-09 ENCOUNTER — Ambulatory Visit (INDEPENDENT_AMBULATORY_CARE_PROVIDER_SITE_OTHER): Payer: Medicare Other

## 2017-09-09 DIAGNOSIS — R0600 Dyspnea, unspecified: Secondary | ICD-10-CM | POA: Diagnosis not present

## 2017-09-26 LAB — CUP PACEART REMOTE DEVICE CHECK
Battery Remaining Longevity: 30 mo
Battery Remaining Percentage: 45 %
Brady Statistic RA Percent Paced: 3 %
Implantable Lead Implant Date: 20130830
Implantable Lead Location: 753860
Implantable Lead Model: 4479
Implantable Lead Serial Number: 523784
Implantable Pulse Generator Implant Date: 20130830
Lead Channel Impedance Value: 408 Ohm
Lead Channel Impedance Value: 502 Ohm
Lead Channel Setting Pacing Amplitude: 2 V
Lead Channel Setting Pacing Amplitude: 2.4 V
Lead Channel Setting Sensing Sensitivity: 2.5 mV
MDC IDC LEAD IMPLANT DT: 20130830
MDC IDC LEAD LOCATION: 753859
MDC IDC LEAD SERIAL: 473325
MDC IDC MSMT LEADCHNL RA PACING THRESHOLD AMPLITUDE: 0.5 V
MDC IDC MSMT LEADCHNL RA PACING THRESHOLD PULSEWIDTH: 0.5 ms
MDC IDC SESS DTM: 20190709063600
MDC IDC SET LEADCHNL RV PACING PULSEWIDTH: 0.5 ms
MDC IDC STAT BRADY RV PERCENT PACED: 100 %
Pulse Gen Serial Number: 134132

## 2017-10-09 ENCOUNTER — Other Ambulatory Visit: Payer: Self-pay | Admitting: Family Medicine

## 2017-10-09 NOTE — Telephone Encounter (Signed)
Last Rx 04/09/17 #30 5R. Last OV with PCP 10/2016. pls advise

## 2017-11-02 ENCOUNTER — Other Ambulatory Visit: Payer: Self-pay

## 2017-11-02 DIAGNOSIS — Z170 Estrogen receptor positive status [ER+]: Principal | ICD-10-CM

## 2017-11-02 DIAGNOSIS — C50411 Malignant neoplasm of upper-outer quadrant of right female breast: Secondary | ICD-10-CM

## 2017-11-02 NOTE — Progress Notes (Signed)
ia

## 2017-11-03 DIAGNOSIS — G4733 Obstructive sleep apnea (adult) (pediatric): Secondary | ICD-10-CM | POA: Diagnosis not present

## 2017-11-03 DIAGNOSIS — R569 Unspecified convulsions: Secondary | ICD-10-CM | POA: Diagnosis not present

## 2017-11-30 ENCOUNTER — Ambulatory Visit: Payer: Self-pay | Admitting: *Deleted

## 2017-11-30 NOTE — Telephone Encounter (Signed)
Left message for pt to return call to office regarding breakout on her body.

## 2017-11-30 NOTE — Telephone Encounter (Signed)
Pt calling with complaints of a "breakout" under left arm, between legs and on "private area". Pt states she was diagnosed with hidradenitis by a dermatologist years ago and does have a flare up from time to time. Pt states that the areas are sore and red and are like a "pimple with a head on it". Pt denies any itching or other symptoms at this time. Pt offered to schedule sooner appt but pt states she wants to know if something can be called in. Pt states she has appt scheduled on 12/15/17 and would like to see if Dr. Glori Bickers would call in the antibiotic before scheduled appt.  Reason for Disposition . Tender bumps in armpits  Answer Assessment - Initial Assessment Questions 1. APPEARANCE of RASH: "Describe the rash."      Like a big pimple or boil with a head on it 2. LOCATION: "Where is the rash located?"      Under left arm, in between legs and on "private area" 3. NUMBER: "How many spots are there?"      Under the left arm there are 4-5 spots in a cluster 4. SIZE: "How big are the spots?" (Inches, centimeters or compare to size of a coin)     Spots are in a cluster size not specified 5. ONSET: "When did the rash start?"      About a week ago, pt states she thought she could hold off until next week when appt is scheduled but states she can not wait any longer 6. ITCHING: "Does the rash itch?" If so, ask: "How bad is the itch?"  (Scale 1-10; or mild, moderate, severe)     No 7. PAIN: "Does the rash hurt?" If so, ask: "How bad is the pain?"  (Scale 1-10; or mild, moderate, severe)     Describes the areas as sore 8. OTHER SYMPTOMS: "Do you have any other symptoms?" (e.g., fever)     No other symptoms at this time 9. PREGNANCY: "Is there any chance you are pregnant?" "When was your last menstrual period?"     n/a  Protocols used: RASH OR REDNESS - LOCALIZED-A-AH

## 2017-12-01 MED ORDER — DOXYCYCLINE HYCLATE 100 MG PO TABS: 100.0000 mg | ORAL_TABLET | Freq: Two times a day (BID) | ORAL | 0 refills | Status: DC

## 2017-12-01 NOTE — Addendum Note (Signed)
Addended by: Tammi Sou on: 12/01/2017 02:02 PM   Modules accepted: Orders

## 2017-12-01 NOTE — Addendum Note (Signed)
Addended by: Loura Pardon A on: 12/01/2017 01:15 PM   Modules accepted: Orders

## 2017-12-01 NOTE — Telephone Encounter (Signed)
PEC never routed this phone note to [place] at all, pt has an appt scheduled for tomorrow but please see prev note from yesterday

## 2017-12-01 NOTE — Telephone Encounter (Signed)
We have treated her with doxycycline prn in the past for this condition  I sent it to [place] me if no improvement or if worse

## 2017-12-01 NOTE — Telephone Encounter (Signed)
Pt notified Rx sent to pharmacy and appt cancelled for tomorrow. She will try the abx 1st and she already has a CPE scheduled with Dr. Glori Bickers so she can recheck her out then

## 2017-12-02 ENCOUNTER — Ambulatory Visit: Payer: Medicare Other | Admitting: Family Medicine

## 2017-12-07 ENCOUNTER — Ambulatory Visit
Admission: RE | Admit: 2017-12-07 | Discharge: 2017-12-07 | Disposition: A | Discharge status: Home / Self Care | Payer: Medicare Other | Source: Ambulatory Visit | Attending: General Surgery | Admitting: General Surgery

## 2017-12-07 ENCOUNTER — Telehealth: Payer: Self-pay | Admitting: Family Medicine

## 2017-12-07 DIAGNOSIS — Z853 Personal history of malignant neoplasm of breast: Secondary | ICD-10-CM | POA: Diagnosis not present

## 2017-12-07 DIAGNOSIS — Z170 Estrogen receptor positive status [ER+]: Secondary | ICD-10-CM | POA: Diagnosis not present

## 2017-12-07 DIAGNOSIS — C50411 Malignant neoplasm of upper-outer quadrant of right female breast: Secondary | ICD-10-CM | POA: Insufficient documentation

## 2017-12-07 DIAGNOSIS — R7309 Other abnormal glucose: Secondary | ICD-10-CM

## 2017-12-07 DIAGNOSIS — R569 Unspecified convulsions: Secondary | ICD-10-CM

## 2017-12-07 DIAGNOSIS — E785 Hyperlipidemia, unspecified: Secondary | ICD-10-CM | POA: Insufficient documentation

## 2017-12-07 DIAGNOSIS — Z13220 Encounter for screening for lipoid disorders: Secondary | ICD-10-CM

## 2017-12-07 DIAGNOSIS — Z0000 Encounter for general adult medical examination without abnormal findings: Secondary | ICD-10-CM

## 2017-12-07 DIAGNOSIS — R922 Inconclusive mammogram: Secondary | ICD-10-CM | POA: Diagnosis not present

## 2017-12-07 IMAGING — MG NEEDLE LOCALIZATION OF THE LEFT BREAST WITH MAMMO GUIDANCE
8 series · 8 of 8 positions shown · non-contrast
Comparison: Previous exams.

CLINICAL DATA: The patient presents for wire localization of left
breast calcifications. Patient is scheduled for left breast excision
today. Patient has known malignancy in the right breast by
ultrasound-guided core biopsy and is scheduled for right lumpectomy.

EXAM:
NEEDLE LOCALIZATION OF THE LEFT BREAST WITH MAMMO GUIDANCE

[L ML (1 of 5)]
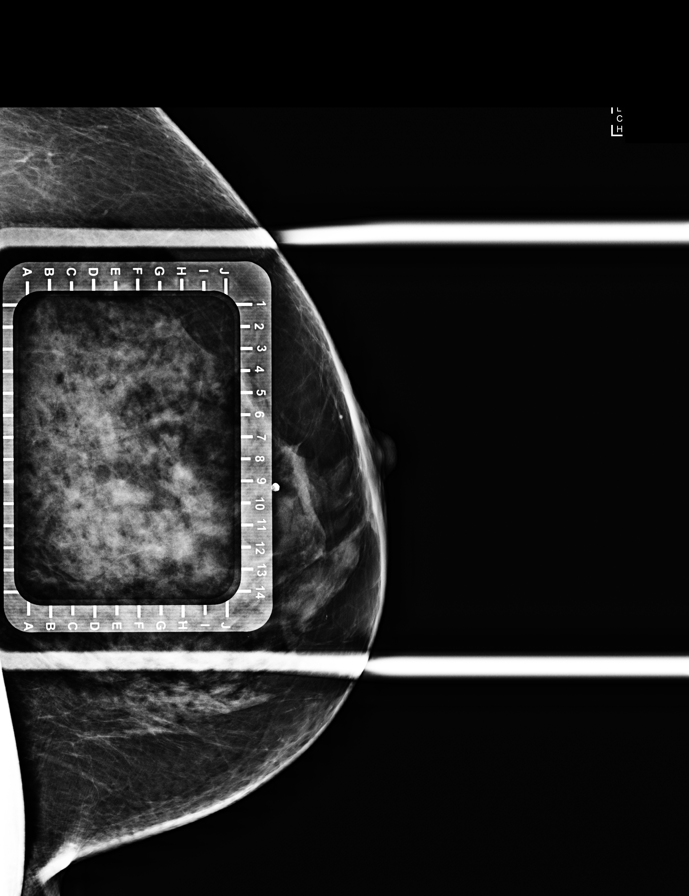

[L ML (2 of 5)]
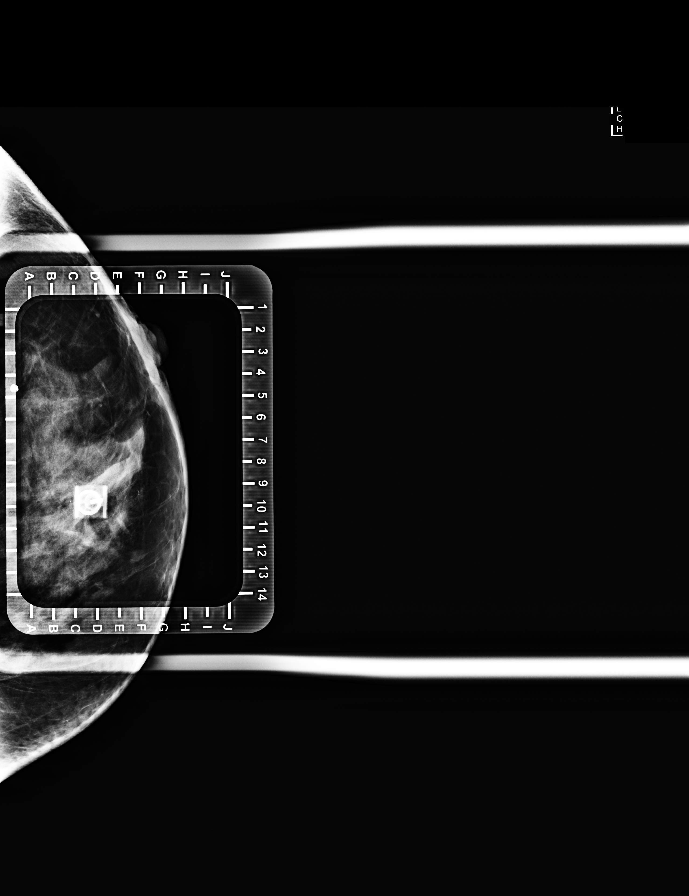

[L CC (1 of 3)]
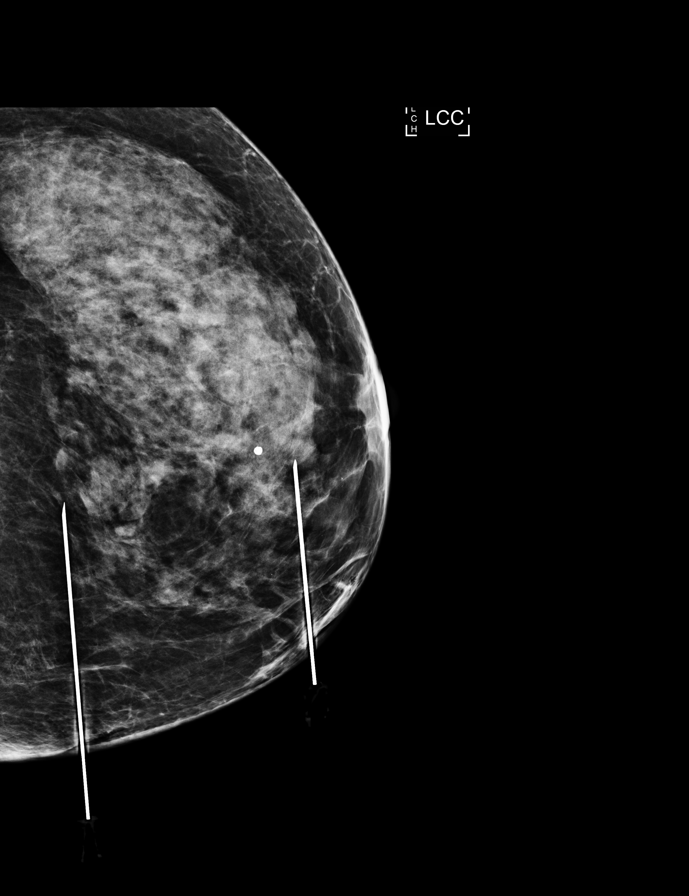

[L ML (3 of 5)]
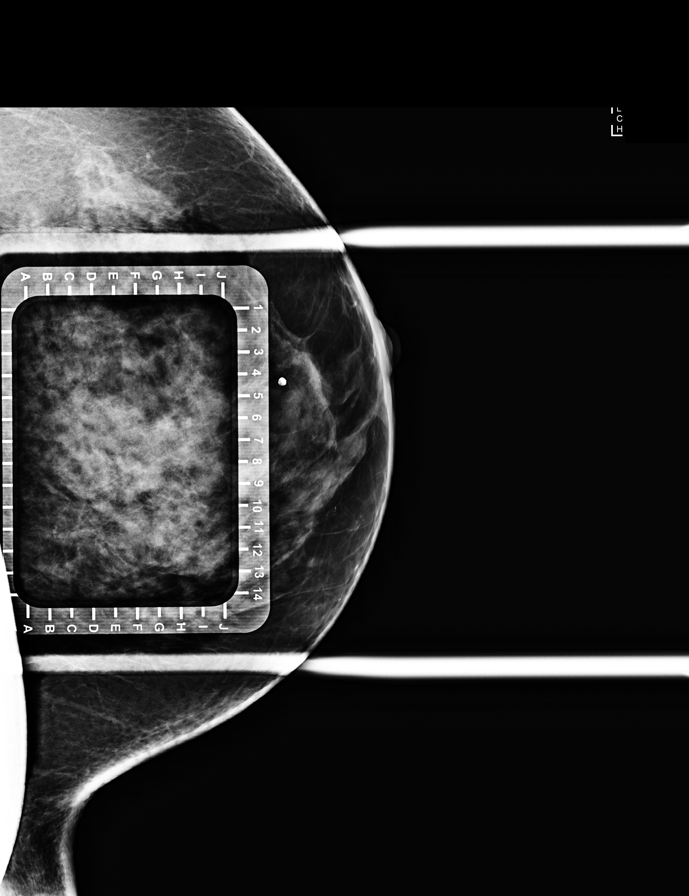

[L CC (2 of 3)]
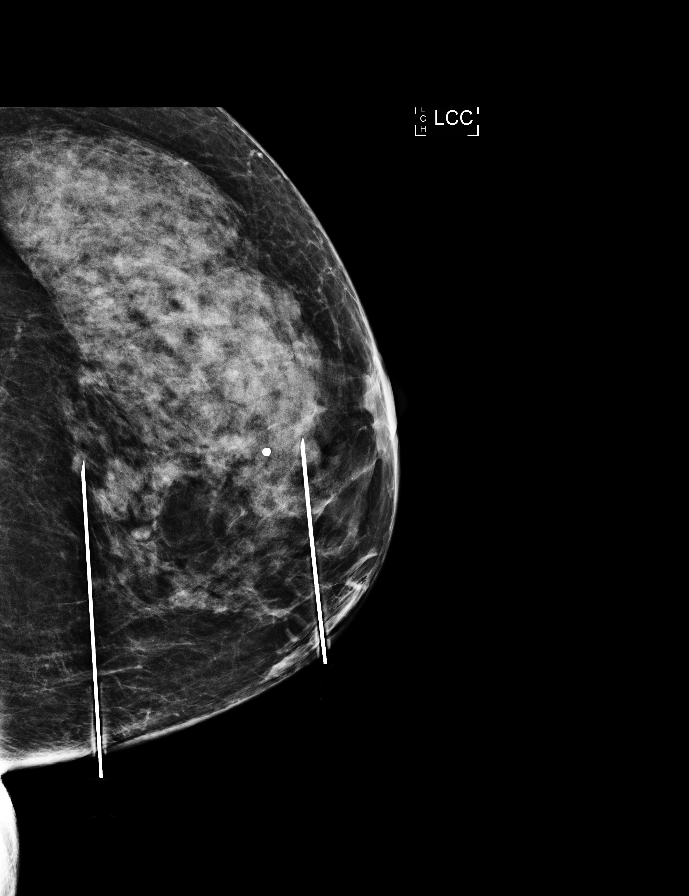

[L CC (3 of 3)]
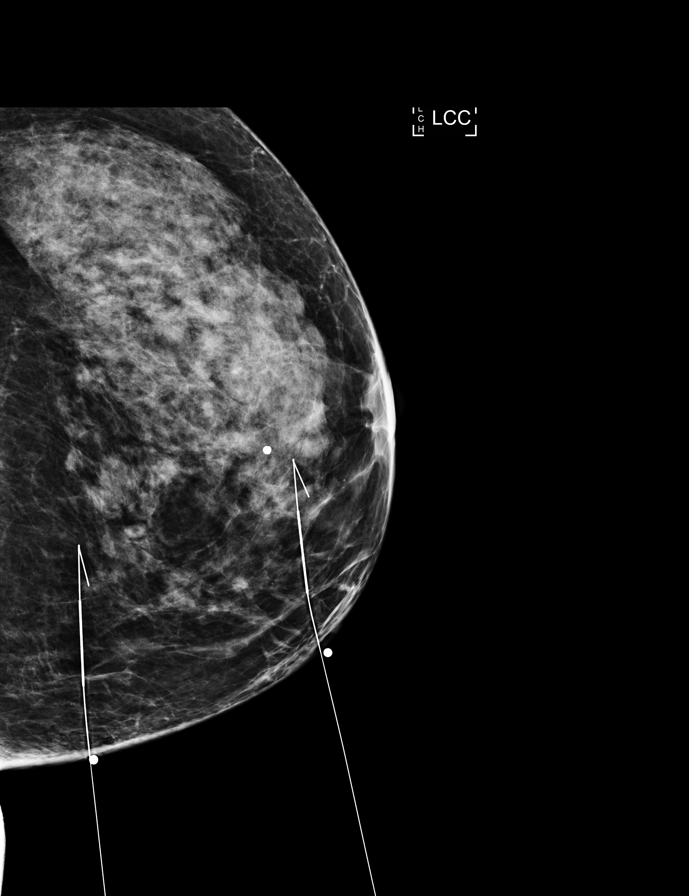

[L ML (4 of 5)]
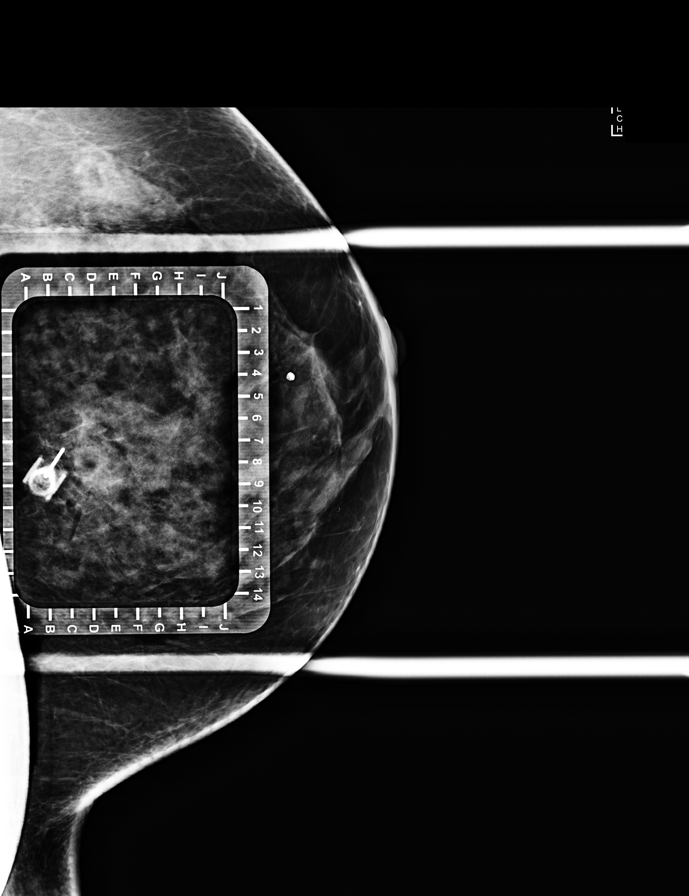

[L ML (5 of 5)]
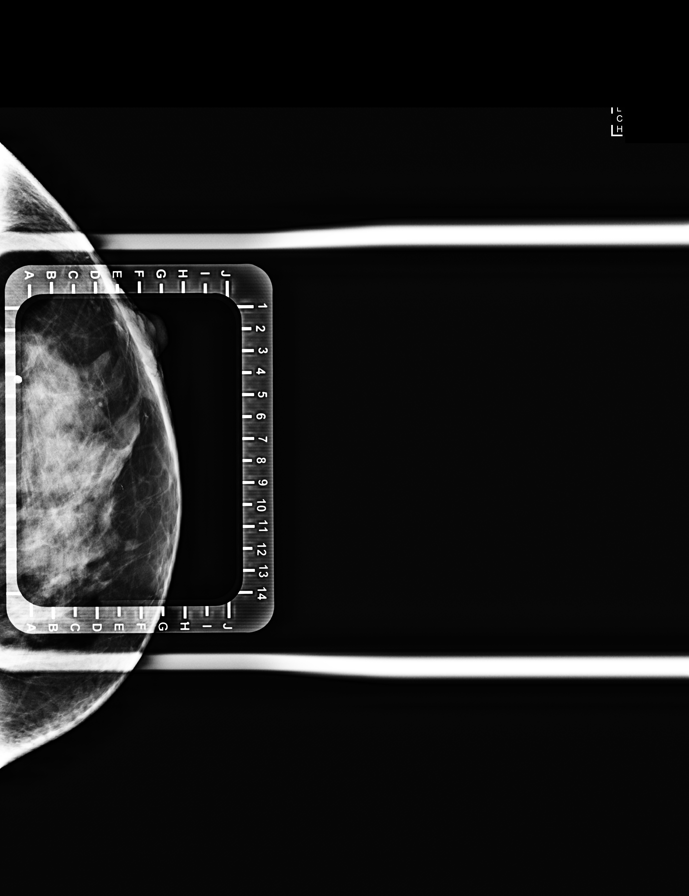

[8 of 8 positions shown; findings below may reference images not displayed]

FINDINGS: Prior to the procedure I spoke with Dr. Tobby. He confirms that
calcifications the left breast have not yet been biopsied.
Mammographically, patient has an area of calcifications in the lower
inner quadrant of the left breast which measures at least 6.0 x
cm. We discussed the option of wire localized biopsy versus
bracketed localization for excisional purposes. He requests
bracketed localization for the purposes of excision. Therefore,
anterior and posterior extent of calcifications in the medial
portion of the left breast are localized.

I met with the patient and we discussed the procedure of wire
bracketing of left breast calcifications, including benefits and
alternatives. The patient understands the plan is for surgical
removal of the region of calcifications in the lower inner quadrant
of the left breast. We discussed the high likelihood of a successful
procedure. We discussed the risks of the procedure, including
infection, bleeding, tissue injury, and further surgery. Informed,
written consent was given. The usual time-out protocol was performed
immediately prior to the procedure.

Using mammographic guidance, sterile technique, 1% lidocaine and a 7
cm modified Kopans needle, the posterior aspect of the
calcifications is localized using medial approach. Using the same
technique, a 5 cm modified Niederhauser needle is placed in the anterior
aspect of the calcifications using a medial approach. The images
were marked for Dr. Tobby.
IMPRESSION: Wire localized bracketing of left breast calcifications. No apparent
complications.

## 2017-12-07 IMAGING — MG BREAST SURGICAL SPECIMEN
2 series · 2 of 2 positions shown · non-contrast
Comparison: Previous exam(s).

CLINICAL DATA: Status post excision of left breast calcifications.

EXAM:
SPECIMEN RADIOGRAPH OF THE LEFT BREAST

[L SPECIMEN]
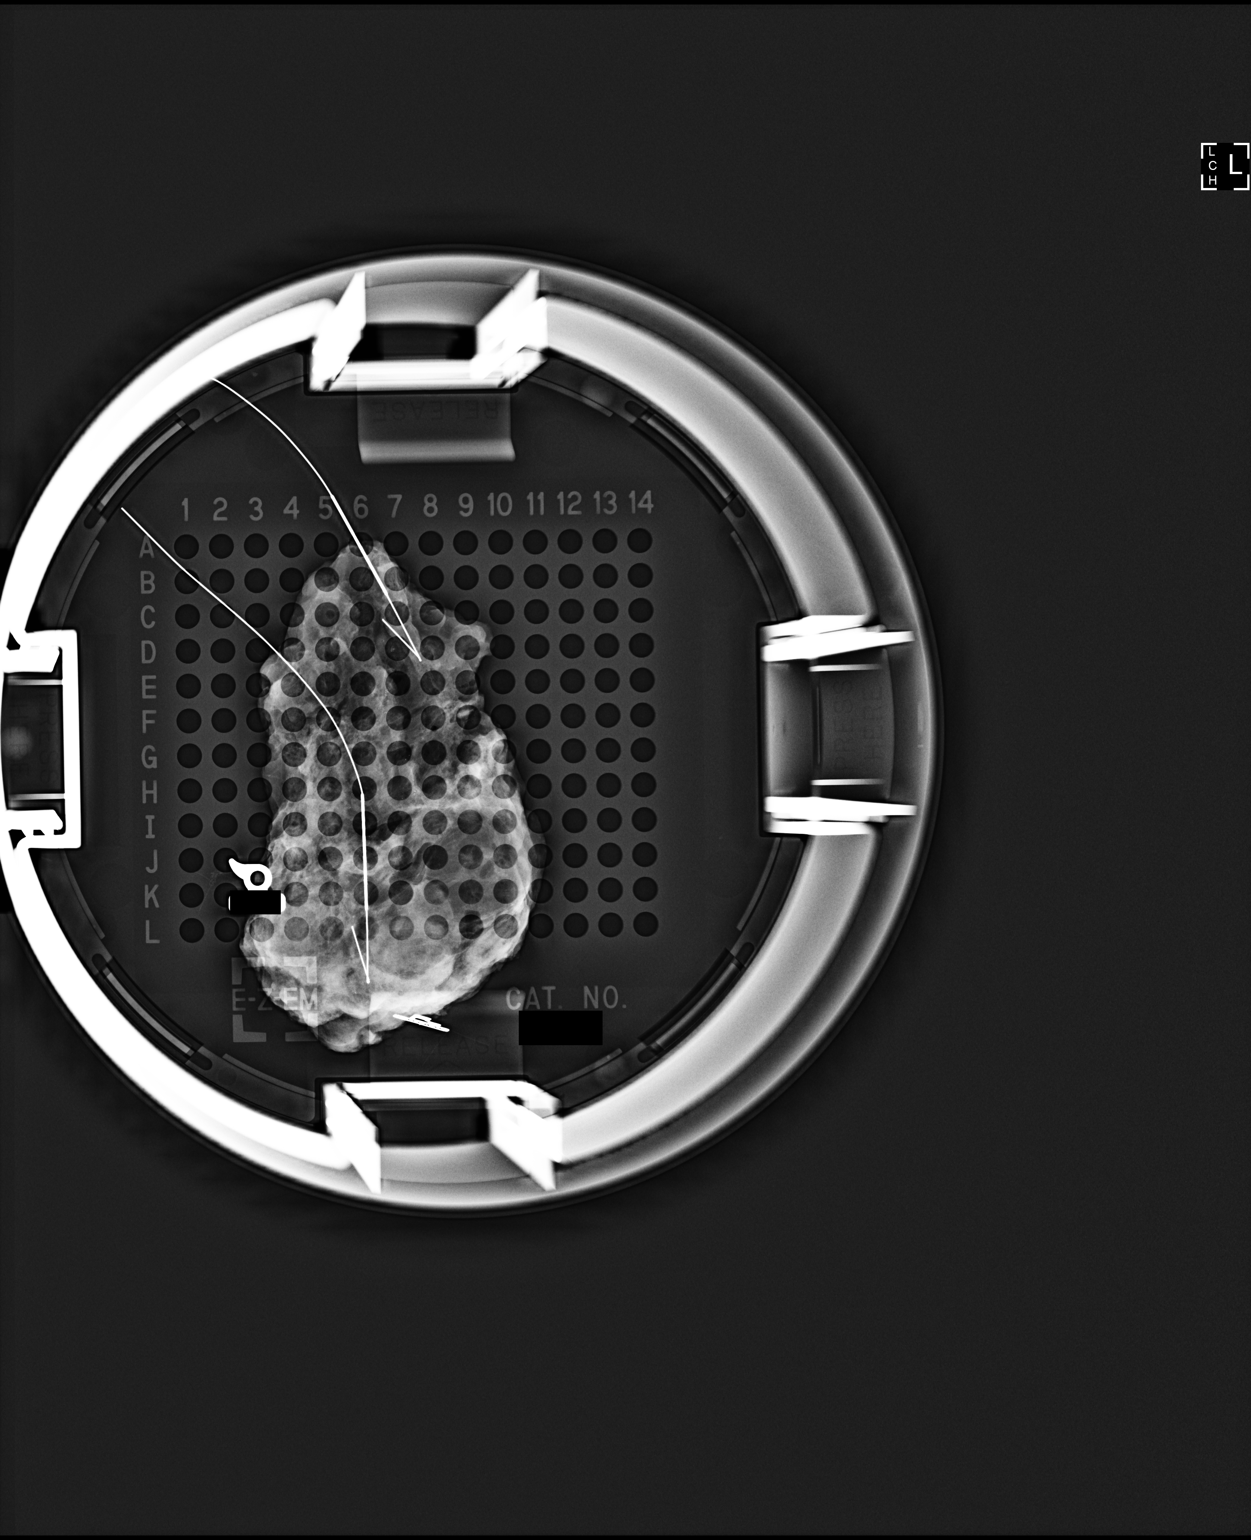

[L]
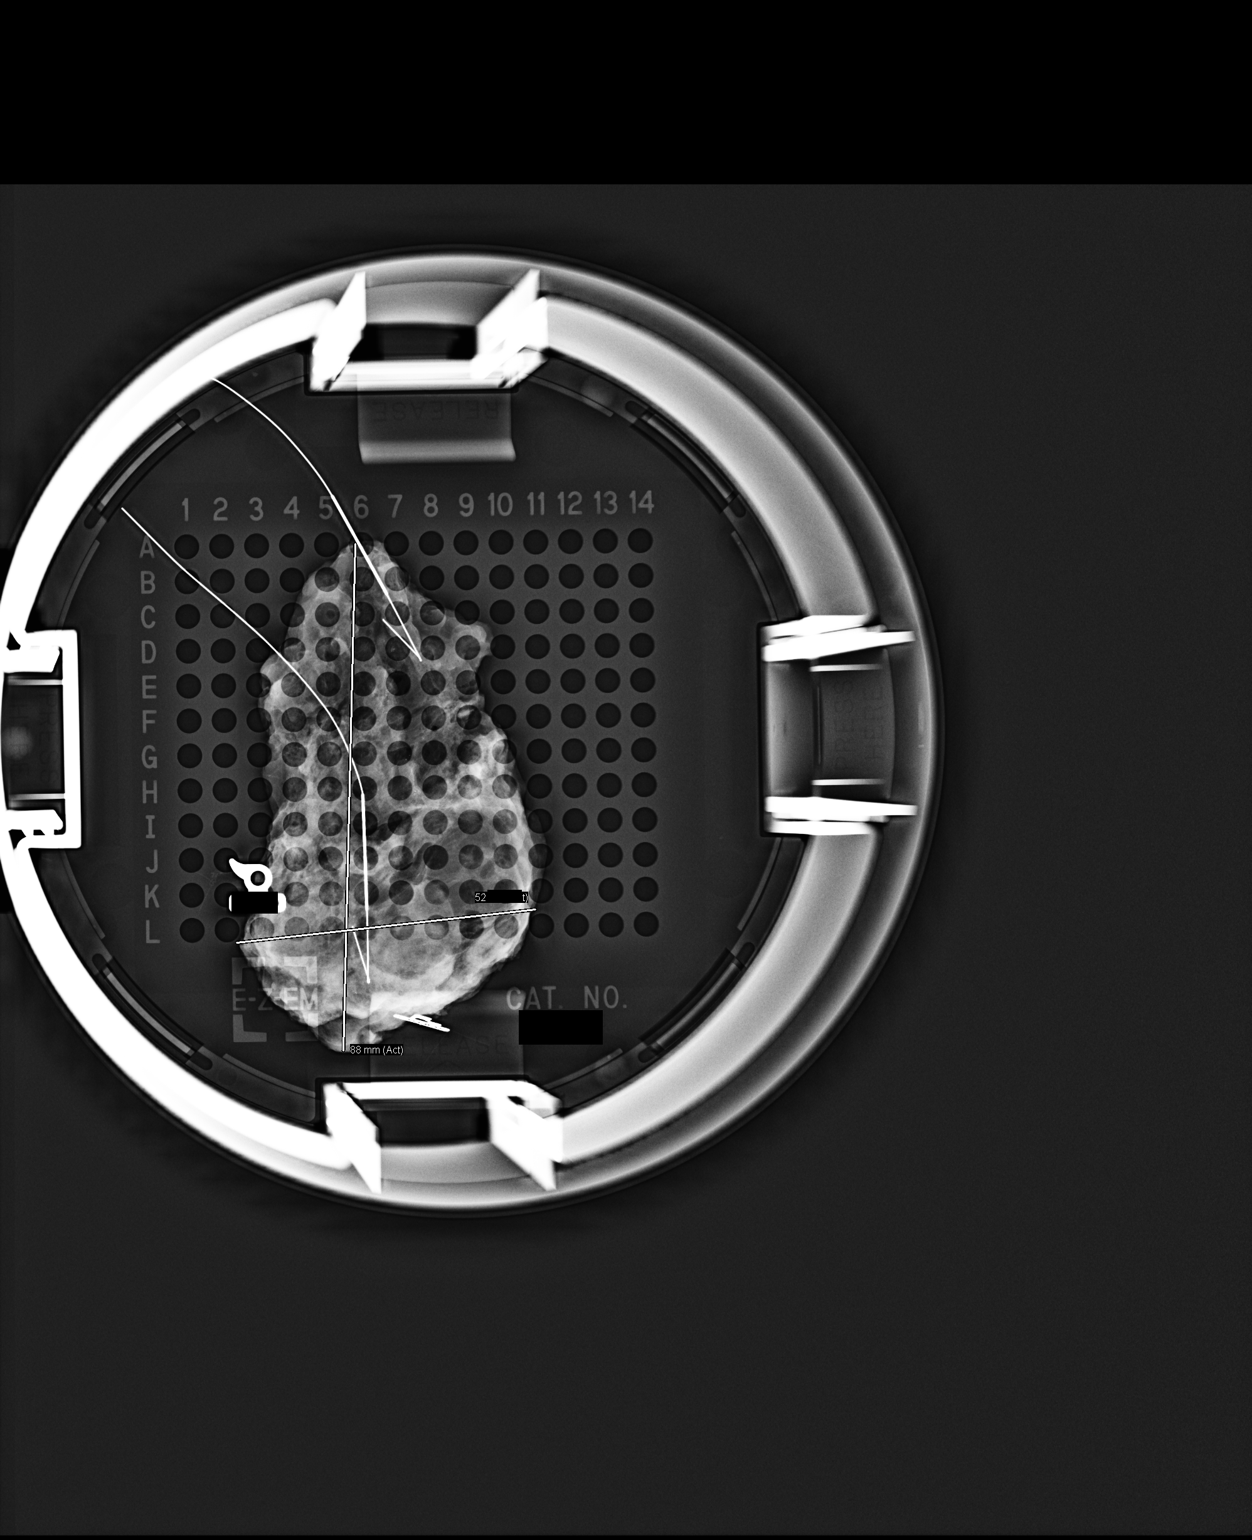

[2 of 2 positions shown; findings below may reference images not displayed]

FINDINGS: Status post excision of the left breast. Two localizing wires are
identified within the specimen. Calcifications are presentand are
marked for pathology.
IMPRESSION: Specimen radiograph of the left breast.

## 2017-12-07 NOTE — Telephone Encounter (Signed)
Please let her know I ordered standard PE labs -not sure how much of it medicare will pay for  Thanks

## 2017-12-07 NOTE — Telephone Encounter (Signed)
-----   Message from Eustace Pen, LPN sent at [phone]  2:25 PM EDT ----- Regarding: Labs 10/15 Lab orders needed. Thank you.  Insurance:  [employer]

## 2017-12-08 ENCOUNTER — Ambulatory Visit (INDEPENDENT_AMBULATORY_CARE_PROVIDER_SITE_OTHER): Payer: Medicare Other

## 2017-12-08 ENCOUNTER — Ambulatory Visit: Payer: Medicare Other

## 2017-12-08 ENCOUNTER — Ambulatory Visit (INDEPENDENT_AMBULATORY_CARE_PROVIDER_SITE_OTHER): Payer: Medicare Other | Admitting: *Deleted

## 2017-12-08 VITALS — BP 102/70 | HR 62 | Temp 98.4°F | Ht 67.5 in | Wt 184.5 lb

## 2017-12-08 DIAGNOSIS — I442 Atrioventricular block, complete: Secondary | ICD-10-CM | POA: Diagnosis not present

## 2017-12-08 DIAGNOSIS — R7309 Other abnormal glucose: Secondary | ICD-10-CM | POA: Diagnosis not present

## 2017-12-08 DIAGNOSIS — Z13220 Encounter for screening for lipoid disorders: Secondary | ICD-10-CM

## 2017-12-08 DIAGNOSIS — Z0000 Encounter for general adult medical examination without abnormal findings: Secondary | ICD-10-CM | POA: Diagnosis not present

## 2017-12-08 DIAGNOSIS — Z23 Encounter for immunization: Secondary | ICD-10-CM | POA: Diagnosis not present

## 2017-12-08 LAB — CBC WITH DIFFERENTIAL/PLATELET
BASOS PCT: 0.6 % (ref 0.0–3.0)
Basophils Absolute: 0 10*3/uL (ref 0.0–0.1)
EOS PCT: 2.5 % (ref 0.0–5.0)
Eosinophils Absolute: 0.2 10*3/uL (ref 0.0–0.7)
HCT: 41.7 % (ref 36.0–46.0)
Hemoglobin: 13.9 g/dL (ref 12.0–15.0)
LYMPHS ABS: 1.2 10*3/uL (ref 0.7–4.0)
Lymphocytes Relative: 18.3 % (ref 12.0–46.0)
MCHC: 33.4 g/dL (ref 30.0–36.0)
MCV: 88.9 fl (ref 78.0–100.0)
MONO ABS: 0.6 10*3/uL (ref 0.1–1.0)
Monocytes Relative: 8.8 % (ref 3.0–12.0)
Neutro Abs: 4.7 10*3/uL (ref 1.4–7.7)
Neutrophils Relative %: 69.8 % (ref 43.0–77.0)
Platelets: 314 10*3/uL (ref 150.0–400.0)
RBC: 4.69 Mil/uL (ref 3.87–5.11)
RDW: 13.1 % (ref 11.5–15.5)
WBC: 6.7 10*3/uL (ref 4.0–10.5)

## 2017-12-08 LAB — LIPID PANEL
CHOLESTEROL: 167 mg/dL (ref 0–200)
HDL: 55.4 mg/dL (ref >39.00)
LDL CALC: 92 mg/dL (ref 0–99)
NonHDL: 111.71
Total CHOL/HDL Ratio: 3
Triglycerides: 99 mg/dL (ref 0.0–149.0)
VLDL: 19.8 mg/dL (ref 0.0–40.0)

## 2017-12-08 LAB — TSH: TSH: 1.02 u[IU]/mL (ref 0.35–4.50)

## 2017-12-08 LAB — COMPREHENSIVE METABOLIC PANEL
ALK PHOS: 78 U/L (ref 39–117)
ALT: 14 U/L (ref 0–35)
AST: 17 U/L (ref 0–37)
Albumin: 4.5 g/dL (ref 3.5–5.2)
BUN: 28 mg/dL — ABNORMAL HIGH (ref 6–23)
CO2: 28 mEq/L (ref 19–32)
CREATININE: 0.95 mg/dL (ref 0.40–1.20)
Calcium: 10.5 mg/dL (ref 8.4–10.5)
Chloride: 100 mEq/L (ref 96–112)
GFR: 61.92 mL/min (ref >60.00)
GLUCOSE: 98 mg/dL (ref 70–99)
POTASSIUM: 4.5 meq/L (ref 3.5–5.1)
SODIUM: 134 meq/L — AB (ref 135–145)
TOTAL PROTEIN: 7.8 g/dL (ref 6.0–8.3)
Total Bilirubin: 0.6 mg/dL (ref 0.2–1.2)

## 2017-12-08 LAB — HEMOGLOBIN A1C: HEMOGLOBIN A1C: 5.7 % (ref 4.6–6.5)

## 2017-12-08 NOTE — Patient Instructions (Signed)
Diana Cook , Thank you for taking time to come for your Medicare Wellness Visit. I appreciate your ongoing commitment to your health goals. Please review the following plan we discussed and let me know if I can assist you in the future.   These are the goals we discussed: Goals    . Patient Stated     Starting 12/08/2017, I will continue to take medications as prescribed.        This is a list of the screening recommended for you and due dates:  Health Maintenance  Topic Date Due  . Tetanus Vaccine  02/24/2019*  . Colon Cancer Screening  11/06/2018  . Mammogram  12/08/2018  . Flu Shot  Completed  . DEXA scan (bone density measurement)  Completed  .  Hepatitis C: One time screening is recommended by Center for Disease Control  (CDC) for  adults born from [birth year] through 1967.   Completed  . Pneumonia vaccines  Completed  *Topic was postponed. The date shown is not the original due date.   Preventive Care for Adults  A healthy lifestyle and preventive care can promote health and wellness. Preventive health guidelines for adults include the following key practices.  . A routine yearly physical is a good way to check with your health care provider about your health and preventive screening. It is a chance to share any concerns and updates on your health and to receive a thorough exam.  . Visit your dentist for a routine exam and preventive care every 6 months. Brush your teeth twice a day and floss once a day. Good oral hygiene prevents tooth decay and gum disease.  . The frequency of eye exams is based on your age, health, family medical history, use  of contact lenses, and other factors. Follow your health care provider's recommendations for frequency of eye exams.  . Eat a healthy diet. Foods like vegetables, fruits, whole grains, low-fat dairy products, and lean protein foods contain the nutrients you need without too many calories. Decrease your intake of foods high in solid fats,  added sugars, and salt. Eat the right amount of calories for you. Get information about a proper diet from your health care provider, if necessary.  . Regular physical exercise is one of the most important things you can do for your health. Most adults should get at least 150 minutes of moderate-intensity exercise (any activity that increases your heart rate and causes you to sweat) each week. In addition, most adults need muscle-strengthening exercises on 2 or more days a week.  Silver Sneakers may be a benefit available to you. To determine eligibility, you may visit the website: www.silversneakers.com or contact program at 918-850-9902 Mon-Fri between 8AM-8PM.   . Maintain a healthy weight. The body mass index (BMI) is a screening tool to identify possible weight problems. It provides an estimate of body fat based on height and weight. Your health care provider can find your BMI and can help you achieve or maintain a healthy weight.   For adults 20 years and older: ? A BMI below 18.5 is considered underweight. ? A BMI of 18.5 to 24.9 is normal. ? A BMI of 25 to 29.9 is considered overweight. ? A BMI of 30 and above is considered obese.   . Maintain normal blood lipids and cholesterol levels by exercising and minimizing your intake of saturated fat. Eat a balanced diet with plenty of fruit and vegetables. Blood tests for lipids and cholesterol should begin at  age 41 and be repeated every 5 years. If your lipid or cholesterol levels are high, you are over 50, or you are at high risk for heart disease, you may need your cholesterol levels checked more frequently. Ongoing high lipid and cholesterol levels should be treated with medicines if diet and exercise are not working.  . If you smoke, find out from your health care provider how to quit. If you do not use tobacco, please do not start.  . If you choose to drink alcohol, please do not consume more than 2 drinks per day. One drink is  considered to be 12 ounces (355 mL) of beer, 5 ounces (148 mL) of wine, or 1.5 ounces (44 mL) of liquor.  . If you are 61-37 years old, ask your health care provider if you should take aspirin to prevent strokes.  . Use sunscreen. Apply sunscreen liberally and repeatedly throughout the day. You should seek shade when your shadow is shorter than you. Protect yourself by wearing long sleeves, pants, a wide-brimmed hat, and sunglasses year round, whenever you are outdoors.  . Once a month, do a whole body skin exam, using a mirror to look at the skin on your back. Tell your health care provider of new moles, moles that have irregular borders, moles that are larger than a pencil eraser, or moles that have changed in shape or color.

## 2017-12-08 NOTE — Progress Notes (Signed)
Subjective:   Diana Cook is a 67 y.o. female who presents for Medicare Annual (Subsequent) preventive examination.  Review of Systems:  N/A Cardiac Risk Factors include: advanced age (>70men, >26 women)     Objective:     Vitals: BP 102/70 (BP Location: Right Arm, Patient Position: Sitting, Cuff Size: Normal)   Pulse 62   Temp 98.4 F (36.9 C) (Oral)   Ht 5' 7.5" (1.715 m) Comment: no shoes  Wt 184 lb 8 oz (83.7 kg)   SpO2 93%   BMI 28.47 kg/m   Body mass index is 28.47 kg/m.  Advanced Directives 12/08/2017 04/16/2017 10/01/2016 10/01/2016 09/30/2016 09/30/2016 09/17/2016  Does Patient Have a Medical Advance Directive? Yes Yes Yes Yes No No;Yes No  Type of Paramedic of [place];Living will [place];Living will - - - [place];Living will -  Does patient want to make changes to medical advance directive? - No - Patient declined No - Patient declined No - Patient declined - - -  Copy of [place] in Chart? No - copy requested No - copy requested - - No - copy requested No - copy requested -  Would patient like information on creating a medical advance directive? - - - - - - -    Tobacco Social History   Tobacco Use  Smoking Status Former Smoker  . Packs/day: 0.40  . Years: 40.00  . Pack years: 16.00  . Types: Cigarettes  . Last attempt to quit: 11/11/2010  . Years since quitting: 7.0  Smokeless Tobacco Never Used     Counseling given: No   Clinical Intake:  Pre-visit preparation completed: Yes  Pain : No/denies pain Pain Score: 0-No pain     Nutritional Status: BMI 25 -29 Overweight Nutritional Risks: None Diabetes: No  How often do you need to have someone help you when you read instructions, pamphlets, or other written materials from your doctor or pharmacy?: 1 - Never What is the last grade level you completed in school?: 12th grade + 2 yrs college  Interpreter Needed?:  No  Comments: pt lives with spouse Information entered by :: LPinson, LPN  Past Medical History:  Diagnosis Date  . Allergic rhinitis   . Arthritis   . Asthma    as a child, mild now  . Breast cancer ([place]) 12/2015   right breast cancer, lumpectomy and mammosite   . Cancer [facility])    breast  . Colon polyps    colonoscopy 7/08, tubular adenoma  . Complete heart block ([place]) 8/13   s/p PPM implanted in [facility]  . COPD (chronic obstructive pulmonary disease) ([place])   . Myocardial infarction ([place]) 2011  . Osteopenia 10/2015  . Pacemaker    2011  . Personal history of radiation therapy 2017   right breast ca, mammosite placed  . Seizure disorder ([place])   . Seizures ([place])    first one was when she was 67 years old   . Small bowel obstruction ([place])    1988 and 2002  . Tobacco abuse    Past Surgical History:  Procedure Laterality Date  . ABDOMINAL HYSTERECTOMY    . BREAST BIOPSY Right 11/2005   benign inflammatory changes, mass due to underwire bra  . BREAST BIOPSY Left 01/02/2016   columnar cell changes without atypical hyperplasia.  Marland Kitchen BREAST BIOPSY Right 12/06/2015   invasive ductal carcinoma  . BREAST EXCISIONAL BIOPSY Left 01/02/2016  done same day as rt lumpectomy, COLUMNAR CELL CHANGE AND HYPERPLASIA ASSOCIATED WITH LUMINAL AND STROMAL CALCIFICATIONS  . BREAST LUMPECTOMY Right 01/02/2016   [facility], clear margins, negative LN  . BREAST LUMPECTOMY WITH SENTINEL LYMPH NODE BIOPSY Right 01/02/2016   Procedure: BREAST LUMPECTOMY WITH SENTINEL LYMPH NODE BX;  Surgeon: [name], MD;  Location: [facility] ORS;  Service: General;  Laterality: Right;  . CATARACT EXTRACTION Bilateral   . COLONOSCOPY  10/2015   Dr Ardis Hughs  . EXPLORATORY LAPAROTOMY  01/25/2001   Exploratory laparotomy, lysis of adhesions, identification of internal hernia secondary to omental adhesion. Prolonged postoperative ileus.  Marland Kitchen gyn surgery  1993   hysterectomy- form endometriosis  . LAPAROSCOPY    .  PACEMAKER INSERTION  10/24/11   [employer] Advantio dual chamber PPM implanted by Dr Bunnie Philips at [facility] in [place]   Family History  Problem Relation Age of Onset  . Stroke Mother   . Heart disease Mother        MI  . Dementia Mother   . Coronary artery disease Father   . Parkinsonism Father   . Cancer - Cervical Daughter 41       died Mar 22, 2022  . Colon cancer Neg Hx   . Breast cancer Neg Hx    Social History   Socioeconomic History  . Marital status: Married    Spouse name: Not on file  . Number of children: Not on file  . Years of education: Not on file  . Highest education level: Not on file  Occupational History    Employer: RETIRED  Social Needs  . Financial resource strain: Not on file  . Food insecurity:    Worry: Not on file    Inability: Not on file  . Transportation needs:    Medical: Not on file    Non-medical: Not on file  Tobacco Use  . Smoking status: Former Smoker    Packs/day: 0.40    Years: 40.00    Pack years: 16.00    Types: Cigarettes    Last attempt to quit: 11/11/2010    Years since quitting: 7.0  . Smokeless tobacco: Never Used  Substance and Sexual Activity  . Alcohol use: Yes    Alcohol/week: 4.0 standard drinks    Types: 4 Glasses of wine per week    Comment: wine (pt said it's very occ)  . Drug use: No  . Sexual activity: Never  Lifestyle  . Physical activity:    Days per week: Not on file    Minutes per session: Not on file  . Stress: Not on file  Relationships  . Social connections:    Talks on phone: Not on file    Gets together: Not on file    Attends religious service: Not on file    Active member of club or organization: Not on file    Attends meetings of clubs or organizations: Not on file    Relationship status: Not on file  Other Topics Concern  . Not on file  Social History Narrative   Married 40 years.   Exercises, does walking tapes    Outpatient Encounter Medications as of  12/08/2017  Medication Sig  . acetaminophen (TYLENOL) 500 MG tablet Take 500 mg by mouth every 6 (six) hours as needed.  . Calcium-Vitamin D-Vitamin K 010-932-35 MG-UNT-MCG TABS Take 1 tablet by mouth 2 (two) times daily.   Marland Kitchen doxycycline (VIBRA-TABS) 100 MG tablet Take 1 tablet (100 mg  total) by mouth 2 (two) times daily.  Marland Kitchen FLUoxetine (PROZAC) 20 MG capsule Take 1 capsule (20 mg total) by mouth daily.  . furosemide (LASIX) 20 MG tablet TAKE 1 TABLET BY MOUTH ONCE DAILY AS NEEDED  . lamoTRIgine (LAMICTAL) 150 MG tablet Take 150 mg by mouth 2 (two) times daily.   Marland Kitchen zolpidem (AMBIEN) 10 MG tablet TAKE 1/2 TO 1 (ONE-HALF TO ONE) TABLET BY MOUTH AT BEDTIME AS NEEDED FOR SLEEP   No facility-administered encounter medications on file as of 12/08/2017.     Activities of Daily Living In your present state of health, do you have any difficulty performing the following activities: 12/08/2017  Hearing? N  Vision? N  Difficulty concentrating or making decisions? N  Walking or climbing stairs? N  Dressing or bathing? N  Doing errands, shopping? N  Preparing Food and eating ? N  Using the Toilet? N  In the past six months, have you accidently leaked urine? N  Do you have problems with loss of bowel control? N  Managing your Medications? N  Managing your Finances? N  Housekeeping or managing your Housekeeping? N  Some recent data might be hidden    Patient Care Team: Tower, Wynelle Fanny, MD as PCP - General Deboraha Sprang, MD as Consulting Physician (Cardiology) Lorelee Cover., MD as Referring Physician (Ophthalmology) Tower, Wynelle Fanny, MD as Consulting Physician (Family Medicine) Bary Castilla Forest Gleason, MD (General Surgery)    Assessment:   This is a routine wellness examination for [place].   Hearing Screening   125Hz  250Hz  500Hz  1000Hz  2000Hz  3000Hz  4000Hz  6000Hz  8000Hz   Right ear:   40 40 40  40    Left ear:   40 40 40  40    Vision Screening Comments: Vision exam in 2018 with Dr. Lorie Apley   Exercise Activities and Dietary recommendations Current Exercise Habits: The patient does not participate in regular exercise at present, Exercise limited by: orthopedic condition(s)  Goals    . Patient Stated     Starting 12/08/2017, I will continue to take medications as prescribed.        Fall Risk Fall Risk  12/08/2017 09/30/2016 07/26/2015 07/11/2014  Falls in the past year? No No No No   Depression Screen PHQ 2/9 Scores 12/08/2017 09/30/2016 07/26/2015 07/11/2014  PHQ - 2 Score 1 2 0 0  PHQ- 9 Score 7 4 - -     Cognitive Function MMSE - Mini Mental State Exam 12/08/2017 09/30/2016 07/26/2015  Orientation to time 5 5 5   Orientation to Place 5 5 5   Registration 3 3 3   Attention/ Calculation 0 0 0  Recall 3 3 2   Recall-comments - - pt was unable to recall 1 of 3 words  Language- name 2 objects 0 0 0  Language- repeat 1 1 1   Language- follow 3 step command 3 3 3   Language- read & follow direction 0 0 0  Write a sentence 0 0 0  Copy design 0 0 0  Total score 20 20 19      PLEASE NOTE: A Mini-Cog screen was completed. Maximum score is 20. A value of 0 denotes this part of [place] MMSE was not completed or the patient failed this part of the Mini-Cog screening.   Mini-Cog Screening Orientation to Time - Max 5 pts Orientation to Place - Max 5 pts Registration - Max 3 pts Recall - Max 3 pts Language Repeat - Max 1 pts Language Follow 3 Step Command -  Max 3 pts     Immunization History  Administered Date(s) Administered  . Influenza Whole 02/24/2005  . Influenza,inj,Quad PF,6+ Mos 12/03/2012, 12/09/2013, 01/16/2015, 02/06/2016, 11/04/2016, 12/08/2017  . Pneumococcal Conjugate-13 07/11/2014  . Pneumococcal Polysaccharide-23 03/21/2005, 07/26/2015  . Td 01/09/1999, 06/14/2007  . Zoster 09/07/2015   Screening Tests Health Maintenance  Topic Date Due  . TETANUS/TDAP  02/24/2019 (Originally 06/13/2017)  . COLONOSCOPY  11/06/2018  . MAMMOGRAM  12/08/2018  . INFLUENZA  VACCINE  Completed  . DEXA SCAN  Completed  . Hepatitis C Screening  Completed  . PNA vac Low Risk Adult  Completed     Plan:     I have personally reviewed, addressed, and noted the following in the patient's chart:  A. Medical and social history B. Use of alcohol, tobacco or illicit drugs  C. Current medications and supplements D. Functional ability and status E.  Nutritional status F.  Physical activity G. Advance directives H. List of other physicians I.  Hospitalizations, surgeries, and ER visits in previous 12 months J.  [place] to include hearing, vision, cognitive, depression L. Referrals and appointments - none  In addition, I have reviewed and discussed with patient certain preventive protocols, quality metrics, and best practice recommendations. A written personalized care plan for preventive services as well as general preventive health recommendations were provided to patient.  See attached scanned questionnaire for additional information.   Signed,   Lindell Noe, MHA, BS, LPN Health Coach

## 2017-12-08 NOTE — Progress Notes (Signed)
PCP notes:   Health maintenance:  Flu vaccine - administered Tetanus vaccine - postponed/insurance  Abnormal screenings:   Depression score: 7 Depression screen [facility] 2/9 12/08/2017 09/30/2016 07/26/2015 07/11/2014  Decreased Interest 0 1 0 0  Down, Depressed, Hopeless 1 1 0 0  PHQ - 2 Score 1 2 0 0  Altered sleeping 1 0 - -  Tired, decreased energy 0 1 - -  Change in appetite 2 1 - -  Feeling bad or failure about yourself  0 0 - -  Trouble concentrating 0 0 - -  Moving slowly or fidgety/restless 3 0 - -  Suicidal thoughts 0 0 - -  PHQ-9 Score 7 4 - -  Difficult doing work/chores Not difficult at all Not difficult at all - -  Some recent data might be hidden    Patient concerns:   None  Nurse concerns:  None  Next PCP appt:   12/15/17 @ 1430  I reviewed health advisor's note, was available for consultation, and agree with documentation and plan. Loura Pardon MD

## 2017-12-09 NOTE — Progress Notes (Signed)
Remote pacemaker transmission.   

## 2017-12-15 ENCOUNTER — Ambulatory Visit (INDEPENDENT_AMBULATORY_CARE_PROVIDER_SITE_OTHER): Payer: Medicare Other | Admitting: Family Medicine

## 2017-12-15 ENCOUNTER — Encounter: Payer: Self-pay | Admitting: Family Medicine

## 2017-12-15 VITALS — BP 98/56 | HR 65 | Temp 98.0°F | Ht 67.5 in | Wt 183.5 lb

## 2017-12-15 DIAGNOSIS — M85859 Other specified disorders of bone density and structure, unspecified thigh: Secondary | ICD-10-CM

## 2017-12-15 DIAGNOSIS — Z13220 Encounter for screening for lipoid disorders: Secondary | ICD-10-CM | POA: Diagnosis not present

## 2017-12-15 DIAGNOSIS — R569 Unspecified convulsions: Secondary | ICD-10-CM

## 2017-12-15 DIAGNOSIS — J449 Chronic obstructive pulmonary disease, unspecified: Secondary | ICD-10-CM | POA: Diagnosis not present

## 2017-12-15 DIAGNOSIS — R7309 Other abnormal glucose: Secondary | ICD-10-CM | POA: Diagnosis not present

## 2017-12-15 DIAGNOSIS — J439 Emphysema, unspecified: Secondary | ICD-10-CM | POA: Insufficient documentation

## 2017-12-15 DIAGNOSIS — Z170 Estrogen receptor positive status [ER+]: Secondary | ICD-10-CM

## 2017-12-15 DIAGNOSIS — R0602 Shortness of breath: Secondary | ICD-10-CM

## 2017-12-15 DIAGNOSIS — C50411 Malignant neoplasm of upper-outer quadrant of right female breast: Secondary | ICD-10-CM | POA: Diagnosis not present

## 2017-12-15 DIAGNOSIS — E2839 Other primary ovarian failure: Secondary | ICD-10-CM

## 2017-12-15 DIAGNOSIS — I442 Atrioventricular block, complete: Secondary | ICD-10-CM | POA: Diagnosis not present

## 2017-12-15 MED ORDER — ALBUTEROL SULFATE HFA 108 (90 BASE) MCG/ACT IN AERS: 2.0000 | INHALATION_SPRAY | RESPIRATORY_TRACT | 5 refills | Status: DC | PRN

## 2017-12-15 MED ORDER — FLUOXETINE HCL 20 MG PO CAPS: 20.0000 mg | ORAL_CAPSULE | Freq: Every day | ORAL | 11 refills | Status: DC

## 2017-12-15 NOTE — Patient Instructions (Addendum)
We will refer you for bone density test   Take care of yourself  Keep walking   We will refer you to our pulmonary office to evaluating shortness of breath

## 2017-12-15 NOTE — Assessment & Plan Note (Signed)
Lab Results  Component Value Date   HGBA1C 5.7 12/08/2017   Watching disc imp of low glycemic diet and wt loss to prevent DM2

## 2017-12-15 NOTE — Progress Notes (Signed)
Subjective:    Patient ID: Diana Cook, female    DOB: 1950/10/14, 67 y.o.   MRN: [id number]  HPI Here for annual f/u of chronic health problems   Feeling fair overall   Wt Readings from Last 3 Encounters:  12/15/17 183 lb 8 oz (83.2 kg)  12/08/17 184 lb 8 oz (83.7 kg)  09/08/17 189 lb (85.7 kg)  went back to weight watchers recently  Walking for exercise  28.32 kg/m   amw on 10/15 Given flu vaccine  Depression score 7 (grief - lost daughter 2 y ago)-did go to counseling which helped   Went back to church a few times   More short of breath- smoked for a long time   Colonoscopy 9/17 with 3 y recall   Mammogram 10/19 -neg R breast cancer hx with lumpectomy and radiation  Previously on arimidex (could not tol side eff)  Self breast exam - no changes   Nl pap 07 Had a hysterectomy   dexa 9/17  Osteopenia of hip  No falls/fx  On ca and D Walking (3 mi today!)   Due for dexa -=[facility]   zostavax 7/17   H/o heart block Sees cardiology Pacemaker  BP Readings from Last 3 Encounters:  12/15/17 (!) 98/56  12/08/17 102/70  09/08/17 118/70   Pulse Readings from Last 3 Encounters:  12/15/17 65  12/08/17 62  09/08/17 60   Blood glucose control Lab Results  Component Value Date   HGBA1C 5.7 12/08/2017  down from 5.9  Seizure disorder  Now on lamictal - neurology  Was able to cut back on medicine  Cholesterol Lab Results  Component Value Date   CHOL 167 12/08/2017   CHOL 190 09/30/2016   CHOL 168 07/10/2014   Lab Results  Component Value Date   HDL 55.40 12/08/2017   HDL 67.50 09/30/2016   HDL 55.80 07/10/2014   Lab Results  Component Value Date   LDLCALC 92 12/08/2017   LDLCALC 108 (H) 09/30/2016   LDLCALC 97 07/10/2014   Lab Results  Component Value Date   TRIG 99.0 12/08/2017   TRIG 69.0 09/30/2016   TRIG 76.0 07/10/2014   Lab Results  Component Value Date   CHOLHDL 3 12/08/2017   CHOLHDL 3 09/30/2016   CHOLHDL 3 07/10/2014   No  results found for: LDLDIRECT Improved LDL    Other labs Lab Results  Component Value Date   CREATININE 0.95 12/08/2017   BUN 28 (H) 12/08/2017   NA 134 (L) 12/08/2017   K 4.5 12/08/2017   CL 100 12/08/2017   CO2 28 12/08/2017   Sodium is low  Lab Results  Component Value Date   ALT 14 12/08/2017   AST 17 12/08/2017   ALKPHOS 78 12/08/2017   BILITOT 0.6 12/08/2017   Lab Results  Component Value Date   WBC 6.7 12/08/2017   HGB 13.9 12/08/2017   HCT 41.7 12/08/2017   MCV 88.9 12/08/2017   PLT 314.0 12/08/2017   Lab Results  Component Value Date   TSH 1.02 12/08/2017    Patient Active Problem List   Diagnosis Date Noted  . COPD exacerbation ([place]) 12/15/2017  . Screening for lipoid disorders 12/07/2017  . Seizures ([place]) 10/01/2016  . Elevated glucose 08/31/2016  . Grief reaction 04/11/2016  . Malignant neoplasm of upper-outer quadrant of right breast in female, estrogen receptor positive ([place]) 12/11/2015  . Osteopenia 11/22/2015  . Estrogen deficiency 09/07/2015  . Bruising 09/07/2015  . Wrinkles 09/07/2015  .  Post herpetic neuralgia 09/01/2014  . Encounter for Medicare annual wellness exam 07/11/2014  . Routine general medical examination at a health care facility 06/26/2013  . H/O small bowel obstruction 04/15/2013  . SOB (shortness of breath) on exertion 02/10/2013  . Complete heart block ([place]) 11/03/2011  . [employer] 11/03/2011  . Lumbar spinal stenosis 09/02/2011  . POSTMENOPAUSAL STATUS 08/06/2009  . HIDRADENITIS SUPPURATIVA 06/26/2008  . HERNIATED DISC 12/14/2007  . BACK, LOWER, PAIN 12/02/2007  . HX, PERSONAL, COLONIC POLYPS 09/14/2006  . Former smoker 06/10/2006  . ASTHMA 06/10/2006  . H/O idiopathic seizure 06/10/2006   Past Medical History:  Diagnosis Date  . Allergic rhinitis   . Arthritis   . Asthma    as a child, mild now  . Breast cancer ([place]) 12/2015   right breast cancer, lumpectomy and mammosite   . Cancer [facility])      breast  . Colon polyps    colonoscopy 7/08, tubular adenoma  . Complete heart block ([place]) 8/13   s/p PPM implanted in [facility]  . COPD (chronic obstructive pulmonary disease) ([place])   . Myocardial infarction ([place]) 2011  . Osteopenia 10/2015  . Pacemaker    2011  . Personal history of radiation therapy 2017   right breast ca, mammosite placed  . Seizure disorder ([place])   . Seizures ([place])    first one was when she was 67 years old   . Small bowel obstruction ([place])    1988 and 2002  . Tobacco abuse    Past Surgical History:  Procedure Laterality Date  . ABDOMINAL HYSTERECTOMY    . BREAST BIOPSY Right 11/2005   benign inflammatory changes, mass due to underwire bra  . BREAST BIOPSY Left 01/02/2016   columnar cell changes without atypical hyperplasia.  Marland Kitchen BREAST BIOPSY Right 12/06/2015   invasive ductal carcinoma  . BREAST EXCISIONAL BIOPSY Left 01/02/2016   done same day as rt lumpectomy, COLUMNAR CELL CHANGE AND HYPERPLASIA ASSOCIATED WITH LUMINAL AND STROMAL CALCIFICATIONS  . BREAST LUMPECTOMY Right 01/02/2016   [facility], clear margins, negative LN  . BREAST LUMPECTOMY WITH SENTINEL LYMPH NODE BIOPSY Right 01/02/2016   Procedure: BREAST LUMPECTOMY WITH SENTINEL LYMPH NODE BX;  Surgeon: [name], MD;  Location: [facility] ORS;  Service: General;  Laterality: Right;  . CATARACT EXTRACTION Bilateral   . COLONOSCOPY  10/2015   Dr Ardis Hughs  . EXPLORATORY LAPAROTOMY  01/25/2001   Exploratory laparotomy, lysis of adhesions, identification of internal hernia secondary to omental adhesion. Prolonged postoperative ileus.  Marland Kitchen gyn surgery  1993   hysterectomy- form endometriosis  . LAPAROSCOPY    . PACEMAKER INSERTION  10/24/11   [employer] Advantio dual chamber PPM implanted by Dr Bunnie Philips at [facility] in [place] History   Tobacco Use  . Smoking status: Former Smoker    Packs/day: 0.40    Years: 40.00    Pack years: 16.00    Types:  Cigarettes    Last attempt to quit: 11/11/2010    Years since quitting: 7.0  . Smokeless tobacco: Never Used  Substance Use Topics  . Alcohol use: Yes    Alcohol/week: 4.0 standard drinks    Types: 4 Glasses of wine per week    Comment: wine (pt said it's very occ)  . Drug use: No   Family History  Problem Relation Age of Onset  . Stroke Mother   . Heart disease Mother  MI  . Dementia Mother   . Coronary artery disease Father   . Parkinsonism Father   . Cancer - Cervical Daughter 39       died 03-27-22  . Colon cancer Neg Hx   . Breast cancer Neg Hx    Allergies  Allergen Reactions  . Codeine Nausea And Vomiting    REACTION: nausea  . Morphine And Related Nausea Only    Nausea    Current Outpatient Medications on File Prior to Visit  Medication Sig Dispense Refill  . acetaminophen (TYLENOL) 500 MG tablet Take 500 mg by mouth every 6 (six) hours as needed.    . Calcium-Vitamin D-Vitamin K 938-182-99 MG-UNT-MCG TABS Take 1 tablet by mouth 2 (two) times daily.     . furosemide (LASIX) 20 MG tablet TAKE 1 TABLET BY MOUTH ONCE DAILY AS NEEDED 90 tablet 3  . lamoTRIgine (LAMICTAL) 100 MG tablet Take 1 tablet by mouth 2 (two) times daily.    Marland Kitchen zolpidem (AMBIEN) 10 MG tablet TAKE 1/2 TO 1 (ONE-HALF TO ONE) TABLET BY MOUTH AT BEDTIME AS NEEDED FOR SLEEP 30 tablet 5   No current facility-administered medications on file prior to visit.      Review of Systems  Constitutional: Positive for fatigue. Negative for activity change, appetite change, fever and unexpected weight change.  HENT: Negative for congestion, ear pain, rhinorrhea, sinus pressure and sore throat.   Eyes: Negative for pain, redness and visual disturbance.  Respiratory: Negative for cough, shortness of breath and wheezing.   Cardiovascular: Negative for chest pain and palpitations.  Gastrointestinal: Negative for abdominal pain, blood in stool, constipation and diarrhea.  Endocrine: Negative for polydipsia and  polyuria.  Genitourinary: Negative for dysuria, frequency and urgency.  Musculoskeletal: Negative for arthralgias, back pain and myalgias.  Skin: Negative for pallor and rash.  Allergic/Immunologic: Negative for environmental allergies.  Neurological: Negative for dizziness, syncope and headaches.  Hematological: Negative for adenopathy. Does not bruise/bleed easily.  Psychiatric/Behavioral: Negative for decreased concentration and dysphoric mood. The patient is not nervous/anxious.        Grief Has had conseling        Objective:   Physical Exam  Constitutional: She appears well-developed and well-nourished. No distress.  overwt and well app  HENT:  Head: Normocephalic and atraumatic.  Right Ear: External ear normal.  Left Ear: External ear normal.  Mouth/Throat: Oropharynx is clear and moist.  Eyes: Pupils are equal, round, and reactive to light. Conjunctivae and EOM are normal. No scleral icterus.  Neck: Normal range of motion. Neck supple. No JVD present. Carotid bruit is not present. No thyromegaly present.  Cardiovascular: Normal rate, regular rhythm, normal heart sounds and intact distal pulses. Exam reveals no gallop.  Pulmonary/Chest: Effort normal and breath sounds normal. No stridor. No respiratory distress. She has no wheezes. She has no rales. She exhibits no tenderness. No breast tenderness, discharge or bleeding.  Diffusely distant bs Good air exch No wheeze or prolonged exp phase   Abdominal: Soft. Bowel sounds are normal. She exhibits no distension, no abdominal bruit and no mass. There is no tenderness.  Genitourinary: No breast tenderness, discharge or bleeding.  Musculoskeletal: Normal range of motion. She exhibits no edema, tenderness or deformity.  No kyphosis  Lymphadenopathy:    She has no cervical adenopathy.  Neurological: She is alert. She has normal reflexes. She displays normal reflexes. No cranial nerve deficit. She exhibits normal muscle tone.  Coordination normal.  Skin: Skin is warm and  dry. No rash noted. No erythema. No pallor.  Hydradenitis - axillae and groin-unchanged   Solar lentigines diffusely   Psychiatric: She has a normal mood and affect.  Pleasant           Assessment & Plan:   Problem List Items Addressed This Visit      Cardiovascular and Mediastinum   Complete heart block (HCC)    Paced now Feeling good         Respiratory   COPD (chronic obstructive pulmonary disease) ([place])    Suspect strongly in light of long hx of smoking and gradually inc sob with exertion over time She is ready for a pulmonary eval Ref done       Relevant Medications   albuterol (PROVENTIL HFA;VENTOLIN HFA) 108 (90 Base) MCG/ACT inhaler     Musculoskeletal and Integument   Osteopenia - Primary    In pt who was briefly on arimidex No falls or fx On ca and D Exercises Former smoker  dexa ordered for re check        Other   Elevated glucose    Lab Results  Component Value Date   HGBA1C 5.7 12/08/2017   Watching disc imp of low glycemic diet and wt loss to prevent DM2       Estrogen deficiency   Relevant Orders   DG Bone Density   Malignant neoplasm of upper-outer quadrant of right breast in female, estrogen receptor positive ([place])    Doing well s/p lumpectomy and radiation Could not tolerate estrogen receptor blockers  No changes in exam  Mammogram utd      Screening for lipoid disorders    Disc goals for lipids and reasons to control them Rev last labs with pt Rev low sat fat diet in detail  LDL down to 90s Good profile       Seizures (HCC)    Was able to dec lamictal dose- she feels better No seizures this year       Relevant Medications   lamoTRIgine (LAMICTAL) 100 MG tablet   SOB (shortness of breath) on exertion    Gradually worsening  Suspect copd in long time smoker (past) Ref to pulmonary for eval She is trying hard to exercise and inc stamina      Relevant Orders    Ambulatory referral to Pulmonology

## 2017-12-15 NOTE — Assessment & Plan Note (Signed)
Disc goals for lipids and reasons to control them Rev last labs with pt Rev low sat fat diet in detail  LDL down to 90s Good profile

## 2017-12-15 NOTE — Assessment & Plan Note (Signed)
Gradually worsening  Suspect copd in long time smoker (past) Ref to pulmonary for eval She is trying hard to exercise and inc stamina

## 2017-12-15 NOTE — Assessment & Plan Note (Signed)
In pt who was briefly on arimidex No falls or fx On ca and D Exercises Former smoker  dexa ordered for re check

## 2017-12-15 NOTE — Assessment & Plan Note (Signed)
Doing well s/p lumpectomy and radiation Could not tolerate estrogen receptor blockers  No changes in exam  Mammogram utd

## 2017-12-15 NOTE — Assessment & Plan Note (Signed)
Suspect strongly in light of long hx of smoking and gradually inc sob with exertion over time She is ready for a pulmonary eval Ref done

## 2017-12-15 NOTE — Assessment & Plan Note (Signed)
Was able to dec lamictal dose- she feels better No seizures this year

## 2017-12-15 NOTE — Assessment & Plan Note (Signed)
Paced now Feeling good

## 2017-12-21 ENCOUNTER — Encounter: Payer: Self-pay | Admitting: Pulmonary Disease

## 2017-12-21 ENCOUNTER — Ambulatory Visit (INDEPENDENT_AMBULATORY_CARE_PROVIDER_SITE_OTHER): Payer: Medicare Other | Admitting: Pulmonary Disease

## 2017-12-21 VITALS — BP 110/60 | HR 63 | Ht 66.0 in | Wt 182.0 lb

## 2017-12-21 DIAGNOSIS — R0989 Other specified symptoms and signs involving the circulatory and respiratory systems: Secondary | ICD-10-CM

## 2017-12-21 DIAGNOSIS — I5189 Other ill-defined heart diseases: Secondary | ICD-10-CM | POA: Insufficient documentation

## 2017-12-21 DIAGNOSIS — J439 Emphysema, unspecified: Secondary | ICD-10-CM | POA: Diagnosis not present

## 2017-12-21 DIAGNOSIS — R0902 Hypoxemia: Secondary | ICD-10-CM | POA: Diagnosis not present

## 2017-12-21 DIAGNOSIS — R0600 Dyspnea, unspecified: Secondary | ICD-10-CM

## 2017-12-21 DIAGNOSIS — I519 Heart disease, unspecified: Secondary | ICD-10-CM | POA: Diagnosis not present

## 2017-12-21 NOTE — Patient Instructions (Addendum)
1) You will need oxygen, have decided that you want to wait some on this. He will start you medication and see how you do. Will check on overnight oxygen level to make sure you don't need at nighttime.  2) We will get formal breathing tests to evaluate your lung issues  3) You have been given a trial of Dualkir one puff twice a day let us know how it does and we can send a prescription in for you.

## 2017-12-21 NOTE — Progress Notes (Signed)
   Subjective:    Patient ID: Diana Cook, female    DOB: January 19, 1951, 67 y.o.   MRN: [id number]  HPI    Review of Systems     Objective:   Physical Exam        Assessment & Plan:

## 2017-12-21 NOTE — Progress Notes (Signed)
Subjective:    Patient ID: Diana Cook, female    DOB: April 29, 1950, 67 y.o.   MRN: [id number]  HPI Mrs. Mabie is a 67 year old former smoker, who presents for evaluation of dyspnea on exertion, she is kindly referred by Dr. Alba Cory. The patient states that she has been diagnosed with COPD previously however she does not recall any pulmonary function testing being done. She has had chest x-rays with to show hyperinflation and changes consistent with emphysema. She has noted dyspnea on exertion for aproximately a year she has noticed this most acutely on inclines. She has not had associated chest pain or diaphoresis. She has not noted any wheezing. She does not have him production or hemoptysis. She has had a cough that occurs usually when she "swallows the wrong way" this usually refers to swallowing her own postnasal drip or saliva. This is also happened for several years. She states that this can lead to a coughing fit in almost the feeling of breathlessness. This usually resolves after the cough passes away. The patient has not had any gastroesophageal reflux symptoms. She does not describe any orthopnea or paroxysmal nocturnal dyspnea. The patient does note that when she develops dyspnea if she rests or tries to calm down she can improve on her symptoms. Stress can also aggravate her symptoms.  Patient has a pacemaker after a myocardial infarction and this is followed by Dr. Caryl Comes. She has not had any issues with the device.  As noted the patient is a former smoker she started smoking at age 17 smoked a pack of cigarettes per day until age 63. 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 He has no pets in the home.  She has a history of seizures. Last seizure about a year ago. She has a history of breast cancer in the past treated with injury and radiation. She follows she anxiously in this regard. Her daughter died breast-cancer. This is been a source of stress for the  patient.  Review of Systems  Constitutional: Positive for activity change and fatigue.  HENT: Positive for congestion and trouble swallowing.   Eyes: Negative.   Respiratory: Positive for choking and shortness of breath.   Cardiovascular: Negative.   Gastrointestinal: Negative.   Genitourinary: Negative.   Musculoskeletal: Negative.   Skin: Negative.   Allergic/Immunologic: Negative.   Neurological: Negative.   Hematological: Negative.   Psychiatric/Behavioral: The patient is nervous/anxious.   All other systems reviewed and are negative.      Objective:   Physical Exam  Constitutional: She is oriented to person, place, and time. She appears well-developed and well-nourished.  Non-toxic appearance.  HENT:  Head: Normocephalic and atraumatic.  Mouth/Throat: Oropharynx is clear and moist.  Eyes: EOM are normal.  Neck: Neck supple. No JVD present. No tracheal deviation present. No thyromegaly present.  Cardiovascular: Normal rate, regular rhythm, normal heart sounds and intact distal pulses.  Pacer on left chest  Pulmonary/Chest: Effort normal. No accessory muscle usage. No respiratory distress. She has decreased breath sounds (Distant sounds diffusely.). She has no wheezes. She has no rhonchi. She has no rales.  Abdominal: Soft. She exhibits no distension.  Musculoskeletal: Normal range of motion.       Right lower leg: Normal. She exhibits no edema.       Left lower leg: Normal. She exhibits no edema.  Lymphadenopathy:    She has no cervical adenopathy.  Neurological: She is alert and oriented to person, place,  and time.  No focal deficits  Skin: Skin is warm and dry.  Psychiatric: Her mood appears anxious.  Nursing note and vitals reviewed.   I have reviewed available laboratory data is x-ray was performed in 2018 shows hyperinflation with no infiltrate. 2D Echo was performed in July 2019 shows left ventricular ejection fraction of 50 to 55% does have diastolic dysfunction  grade 1. She was also exhibiting Doppler parameters consistent with high ventricular filling pressures. She has some septal motion dysynergy mild mitral regurgitation. Right ventricle was normal. Left atrium was upper limits of normal.  Spirometry was done today which shows mild obstruction by FEV1/FVC criteria however well preserved FEV1. Suspect this is due to emphysema the patient will need formal PFTs to determine  DLCO.  Assessment & Plan:   1) Dyspnea: the patient's dyspnea is related to several issues one is likely COPD with emphysema and associated hypoxemia likely due to diffusion capacity impairment. She will need formal PFTs to further delve into this issue. Additionally the patient does have evidence of diastolic dysfunction with high left ventricular filling pressures and mild increase in left atrial size, this issue adds complexity to her management. This is being followed by cardiology. We'll see the patient and follow-up in 3 to 4 weeks time she is to contact us prior to that time should any new difficulties arise.  2) Hypoxia: patient is oxygen saturation at rest was 88%. With ambulation she decreased to 86%. She was placed on oxygen and she maintain oxygen saturations between 92 to 93%. She is however declining supplemental oxygen at present. She is willing to consider nocturnal oxygen if we can determine that she needs this. She will have an overnight oximetry to evaluate this issue. I suspect that she will need oxygen supplementation. In the meantime we will try to optimize her COPD management.  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 We will give her a trial Duaklir Pressair  (aclidinium bromide/formoterol fumarate) one puff twice a day. She has been having some financial difficulties getting her medications and were able to procure some samples for her. All other LABA/LAMAs were not covered by her  insurance medication supplement. Note that the spirometry performed today does show some mild obstruction by FEV1/FVC criteria however I cannot correct this on the preset interpretation by the machine.  4) Globus sensation this is likely due to postnasal drip, recommend nasal hygiene.  5) Past history of heavy smoking, the patient will consider low-dose CT for lung cancer screening.  Thank you for allowing me to participate in this patient's care.

## 2017-12-22 ENCOUNTER — Encounter: Payer: Self-pay | Admitting: General Surgery

## 2017-12-22 ENCOUNTER — Ambulatory Visit (INDEPENDENT_AMBULATORY_CARE_PROVIDER_SITE_OTHER): Payer: Medicare Other | Admitting: General Surgery

## 2017-12-22 ENCOUNTER — Other Ambulatory Visit: Payer: Self-pay

## 2017-12-22 VITALS — BP 110/70 | HR 72 | Temp 97.5°F | Resp 12 | Ht 66.0 in | Wt 181.0 lb

## 2017-12-22 DIAGNOSIS — Z170 Estrogen receptor positive status [ER+]: Secondary | ICD-10-CM

## 2017-12-22 DIAGNOSIS — C50411 Malignant neoplasm of upper-outer quadrant of right female breast: Secondary | ICD-10-CM

## 2017-12-22 NOTE — Progress Notes (Signed)
Patient ID: Diana Cook, female   DOB: Jul 25, 1950, 67 y.o.   MRN: [id number]  Chief Complaint  Patient presents with  . Follow-up    HPI Diana Cook is a 67 y.o. female who presents for a breast evaluation. The most recent mammogram was done on 12/07/2017.  Patient does perform regular self breast checks and gets regular mammograms done.   She had an appointment with Dr Patsey Berthold yesterday for hypoxia. She will be starting on respiratory mediations soon.  She states her shortness of breath has progressively gotten worse over 2 years. Worse with steps and walking up an incline.  HPI  Past Medical History:  Diagnosis Date  . Allergic rhinitis   . Arthritis   . Asthma    as a child, mild now  . Breast cancer ([place]) 12/2015   right breast cancer, lumpectomy and mammosite   . [facility])    breast  . Colon polyps    colonoscopy 7/08, tubular adenoma  . Complete heart block ([place]) 8/13   s/p PPM implanted in [facility]  . COPD (chronic obstructive pulmonary disease) ([place])   . Myocardial infarction ([place]) 2011  . Osteopenia 10/2015  . Pacemaker    2011  . Personal history of radiation therapy 2017   right breast ca, mammosite placed  . Seizure disorder ([place])   . Seizures ([place])    first one was when she was 68 years old   . Small bowel obstruction ([place])    1988 and 2002  . Tobacco abuse     Past Surgical History:  Procedure Laterality Date  . ABDOMINAL HYSTERECTOMY    . BREAST BIOPSY Right 11/2005   benign inflammatory changes, mass due to underwire bra  . BREAST BIOPSY Left 01/02/2016   columnar cell changes without atypical hyperplasia.  Marland Kitchen BREAST EXCISIONAL BIOPSY Left 01/02/2016   COLUMNAR CELL CHANGE AND HYPERPLASIA ASSOCIATED WITH LUMINAL AND STROMAL CALCIFICATIONS  . BREAST LUMPECTOMY WITH SENTINEL LYMPH NODE BIOPSY Right 01/02/2016   pT1c, N0; ER/ PR 100%; Her 2 neu not over expressed: BREAST LUMPECTOMY WITH SENTINEL LYMPH NODE BX;  Surgeon: [name],  MD;  Location: [facility] ORS;  Service: General;  Laterality: Right;  . CATARACT EXTRACTION Bilateral   . COLONOSCOPY  10/2015   Dr Ardis Hughs  . EXPLORATORY LAPAROTOMY  01/25/2001   Exploratory laparotomy, lysis of adhesions, identification of internal hernia secondary to omental adhesion. Prolonged postoperative ileus.  Marland Kitchen gyn surgery  1993   hysterectomy- form endometriosis  . LAPAROSCOPY    . PACEMAKER INSERTION  10/24/11   [employer] Advantio dual chamber PPM implanted by Dr Bunnie Philips at [facility] in [place]    Family History  Problem Relation Age of Onset  . Stroke Mother   . Heart disease Mother        MI  . Dementia Mother   . Coronary artery disease Father   . Parkinsonism Father   . Cancer - Cervical Daughter 74       died 03/24/2022  . Colon cancer Neg Hx   . Breast cancer Neg Hx     Social History Social History   Tobacco Use  . Smoking status: Former Smoker    Packs/day: 0.40    Years: 40.00    Pack years: 16.00    Types: Cigarettes    Last attempt to quit: 11/11/2010    Years since quitting: 7.1  . Smokeless tobacco: Never Used  Substance Use Topics  .  Alcohol use: Yes    Alcohol/week: 4.0 standard drinks    Types: 4 Glasses of wine per week    Comment: wine (pt said it's very occ)  . Drug use: No    Allergies  Allergen Reactions  . Codeine Nausea And Vomiting    REACTION: nausea  . Morphine And Related Nausea Only    Nausea     Current Outpatient Medications  Medication Sig Dispense Refill  . acetaminophen (TYLENOL) 500 MG tablet Take 500 mg by mouth every 6 (six) hours as needed.    Marland Kitchen albuterol (PROVENTIL HFA;VENTOLIN HFA) 108 (90 Base) MCG/ACT inhaler Inhale 2 puffs into the lungs every 4 (four) hours as needed for wheezing. 1 Inhaler 5  . Calcium-Vitamin D-Vitamin K 785-885-02 MG-UNT-MCG TABS Take 1 tablet by mouth 2 (two) times daily.     Marland Kitchen FLUoxetine (PROZAC) 20 MG capsule Take 1 capsule (20 mg total) by mouth daily. 30  capsule 11  . furosemide (LASIX) 20 MG tablet TAKE 1 TABLET BY MOUTH ONCE DAILY AS NEEDED 90 tablet 3  . lamoTRIgine (LAMICTAL) 100 MG tablet Take 1 tablet by mouth 2 (two) times daily.    Marland Kitchen zolpidem (AMBIEN) 10 MG tablet TAKE 1/2 TO 1 (ONE-HALF TO ONE) TABLET BY MOUTH AT BEDTIME AS NEEDED FOR SLEEP 30 tablet 5   No current facility-administered medications for this visit.     Review of Systems Review of Systems  Constitutional: Negative.   HENT: Negative.   Respiratory: Positive for shortness of breath.   Cardiovascular: Negative.     Blood pressure 110/70, pulse 72, temperature (!) 97.5 F (36.4 C), temperature source Skin, resp. rate 12, height 5\' 6"  (1.676 m), weight 181 lb (82.1 kg), SpO2 (!) 89 %.  Physical Exam Physical Exam  Constitutional: She appears well-developed and well-nourished.  Cardiovascular: Normal rate, regular rhythm and normal heart sounds.  Pulmonary/Chest: Effort normal and breath sounds normal.    Skin: Skin is warm and dry.  Psychiatric: She has a normal mood and affect. Her behavior is normal. Judgment and thought content normal.    Data Reviewed Bilateral diagnostic mammograms dated December 07, 2017 reviewed.  BI-RADS-2.  Assessment    No evidence of recurrent breast cancer.    Plan  Patient to follow up in one year. HPI, Physical Exam, Assessment and Plan have been scribed under the direction and in the presence of Hervey Ard, MD.  Gaspar Cola, CMA Wayna Chalet, CMA  I have completed the exam and reviewed the above documentation for accuracy and completeness.  I agree with the above.  Haematologist has been used and any errors in dictation or transcription are unintentional.  Hervey Ard, M.D., F.A.C.S. [place] [facility] Patrena Santalucia 12/23/2017, 6:01 PM

## 2017-12-22 NOTE — Patient Instructions (Addendum)
The patient has been asked to return to the office in one year with a bilateral diagnostic mammogram.The patient is aware to call back for any questions or concerns.  We will see you back in one year.  Please follow up with your pulmonologist.

## 2017-12-23 ENCOUNTER — Encounter: Payer: Self-pay | Admitting: General Surgery

## 2017-12-29 DIAGNOSIS — Z961 Presence of intraocular lens: Secondary | ICD-10-CM | POA: Diagnosis not present

## 2017-12-31 ENCOUNTER — Telehealth: Payer: Self-pay

## 2017-12-31 ENCOUNTER — Ambulatory Visit: Payer: Medicare Other | Attending: Pulmonary Disease

## 2017-12-31 DIAGNOSIS — R0600 Dyspnea, unspecified: Secondary | ICD-10-CM | POA: Diagnosis not present

## 2017-12-31 DIAGNOSIS — G4734 Idiopathic sleep related nonobstructive alveolar hypoventilation: Secondary | ICD-10-CM

## 2017-12-31 MED ORDER — ALBUTEROL SULFATE (2.5 MG/3ML) 0.083% IN NEBU
2.5000 mg | INHALATION_SOLUTION | Freq: Once | RESPIRATORY_TRACT | Status: AC
  Administered 2017-12-31: 2.5 mg via RESPIRATORY_TRACT
  Filled 2017-12-31: qty 3

## 2017-12-31 NOTE — Telephone Encounter (Signed)
Spoke to patient re: ONO results. Patient aware, orders entered. 2 lpm @[facility] .

## 2018-01-05 MED ORDER — ACLIDINIUM BR-FORMOTEROL FUM 400-12 MCG/ACT IN AEPB: 1.0000 | INHALATION_SPRAY | Freq: Every day | RESPIRATORY_TRACT | 0 refills | Status: DC

## 2018-01-05 NOTE — Addendum Note (Signed)
Addended by: Darreld Mclean on: 01/05/2018 02:50 PM   Modules accepted: Orders

## 2018-01-07 ENCOUNTER — Ambulatory Visit: Payer: Medicare Other | Admitting: Pulmonary Disease

## 2018-01-13 ENCOUNTER — Encounter: Payer: Self-pay | Admitting: Pulmonary Disease

## 2018-01-13 ENCOUNTER — Ambulatory Visit (INDEPENDENT_AMBULATORY_CARE_PROVIDER_SITE_OTHER): Payer: Medicare Other | Admitting: Pulmonary Disease

## 2018-01-13 VITALS — BP 130/70 | Ht 66.0 in | Wt 178.8 lb

## 2018-01-13 DIAGNOSIS — R0602 Shortness of breath: Secondary | ICD-10-CM

## 2018-01-13 DIAGNOSIS — G4734 Idiopathic sleep related nonobstructive alveolar hypoventilation: Secondary | ICD-10-CM | POA: Diagnosis not present

## 2018-01-13 DIAGNOSIS — Z87891 Personal history of nicotine dependence: Secondary | ICD-10-CM | POA: Diagnosis not present

## 2018-01-13 DIAGNOSIS — J439 Emphysema, unspecified: Secondary | ICD-10-CM | POA: Diagnosis not present

## 2018-01-13 NOTE — Progress Notes (Signed)
Subjective:    Patient ID: Diana Cook, female    DOB: Aug 04, 1950, 67 y.o.   MRN: [id number]  HPI The patient is a 67 year old former smoker who presents for follow-up of dyspnea on exertion.  She was first evaluated here on 21 December 2017.  On our evaluation at that time she was noted to have significant hypoxemia with ambulation.  She declined oxygen with exertion.  We proceeded to obtain an overnight oximetry which showed significant desaturations during sleep.  She is now on oxygen at 2 L/min nocturnally.  She notes that this has improved her symptoms dramatically, she sleeps better and feels rested in the morning.  She is also less breathless during the day.  We gave her a trial of Duaklir Pressair but she did not feel that medication helped her symptoms.  She believes the nocturnal oxygen has made the most difference.  She underwent pulmonary function testing on 31 December 2017 that showed minimal airway obstruction with preserved FEV1 and FVC, increased airway resistance and decreased diffusion capacity.  This is consistent with emphysema with preserved FEV1 and FVC.  Patient does not describe any other symptomatology today he has not had any bouts of coughing as she had previously.  Overall looks well and feels well.    Review of Systems  Constitutional: Positive for activity change (More active). Negative for appetite change, fatigue and unexpected weight change.  HENT: Positive for postnasal drip.   Eyes: Negative.   Respiratory: Positive for shortness of breath (Improved). Negative for chest tightness and wheezing.   Cardiovascular: Negative.   Gastrointestinal: Negative.   Endocrine: Negative.   Allergic/Immunologic: Negative.   Neurological: Negative.   All other systems reviewed and are negative.      Objective:   Physical Exam  Constitutional: She is oriented to person, place, and time. She appears well-developed and well-nourished.  Non-toxic appearance. She does not  appear ill.  HENT:  Head: Normocephalic and atraumatic.  Mouth/Throat: Oropharynx is clear and moist.  Eyes: Pupils are equal, round, and reactive to light. EOM are normal.  Neck: Neck supple. No JVD present. No tracheal deviation present. No thyromegaly present.  Cardiovascular: Normal rate, regular rhythm, normal heart sounds and intact distal pulses.  Pulmonary/Chest: Effort normal and breath sounds normal. She has no wheezes. She has no rhonchi. She has no rales.  Abdominal: Soft. She exhibits no distension.  Musculoskeletal: Normal range of motion.       Right lower leg: Normal. She exhibits no edema.       Left lower leg: Normal. She exhibits no edema.  Neurological: She is alert and oriented to person, place, and time.  No focal deficits.  Skin: Skin is warm. No rash noted. No erythema. Nails show no clubbing.  Psychiatric: She has a normal mood and affect. Her behavior is normal.  Nursing note and vitals reviewed.         Assessment & Plan:  1) Dyspnea: the patient's dyspnea is related to several issues one is likely COPD with emphysema and associated hypoxemia likely due to diffusion capacity impairment.  Her dyspnea has improved dramatically with supplemental nocturnal oxygen for treatment of nocturnal hypoxemia associated with emphysema.  Pulmonary function testing confirms emphysema with preserved FEV1/FVC.  2) Nocturnal hypoxemia:  The patient has improved with regards to hypoxemia during the daytime.  Today's oxygen saturation was 95% on room air and she showed no evidence of desaturation.  She has been on nocturnal oxygen limitation for nocturnal  hypoxemia associated with emphysema.  She has noted restorative sleep and increased stamina during the day.  He will continue nocturnal oxygen supplementation for now.  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  She feels that using as needed  albuterol is sufficient at present.  She has minimal obstruction on pulmonary function testing with preserved FEV1/FVC and significantly decreased diffusion capacity which is not unusual to see in emphysema.  She have evidence of increased airway resistance.  She may develop further issues with FEV1 decline and she will have to be monitored carefully.  At present she does not need a maintenance inhaler.  Continue use of as needed albuterol.  4) Globus sensation this is likely due to postnasal drip, recommend continuing nasal hygiene.  She has done much better in this regard.  5) Past history of heavy smoking,  the patient has agreed to pursue low-dose CT for lung cancer screening.  Will be referred for the same.  We will see the patient in follow-up in 3 months time she is to contact us prior to that time should any new difficulties arise.

## 2018-01-13 NOTE — Patient Instructions (Signed)
Follow up in 3 months

## 2018-01-14 ENCOUNTER — Telehealth: Payer: Self-pay | Admitting: *Deleted

## 2018-01-14 ENCOUNTER — Encounter: Payer: Self-pay | Admitting: Pulmonary Disease

## 2018-01-14 DIAGNOSIS — Z122 Encounter for screening for malignant neoplasm of respiratory organs: Secondary | ICD-10-CM

## 2018-01-14 DIAGNOSIS — Z87891 Personal history of nicotine dependence: Secondary | ICD-10-CM

## 2018-01-14 NOTE — Telephone Encounter (Signed)
Received referral for initial lung cancer screening scan. Contacted patient and obtained smoking history,(former, quit 2012, 42 pack year) as well as answering questions related to screening process. Patient denies signs of lung cancer such as weight loss or hemoptysis. Patient denies comorbidity that would prevent curative treatment if lung cancer were found. Patient is scheduled for shared decision making visit and CT scan on 01/18/18 at 1030am.

## 2018-01-18 ENCOUNTER — Ambulatory Visit
Admission: RE | Admit: 2018-01-18 | Discharge: 2018-01-18 | Disposition: A | Discharge status: Home / Self Care | Payer: Medicare Other | Source: Ambulatory Visit | Attending: Nurse Practitioner | Admitting: Nurse Practitioner

## 2018-01-18 ENCOUNTER — Inpatient Hospital Stay: Payer: Medicare Other | Attending: Nurse Practitioner | Admitting: Nurse Practitioner

## 2018-01-18 ENCOUNTER — Encounter: Payer: Self-pay | Admitting: Nurse Practitioner

## 2018-01-18 DIAGNOSIS — Z122 Encounter for screening for malignant neoplasm of respiratory organs: Secondary | ICD-10-CM | POA: Insufficient documentation

## 2018-01-18 DIAGNOSIS — Z87891 Personal history of nicotine dependence: Secondary | ICD-10-CM | POA: Diagnosis not present

## 2018-01-18 NOTE — Progress Notes (Signed)
In accordance with CMS guidelines, patient has met eligibility criteria including age, absence of signs or symptoms of lung cancer.  Social History   Tobacco Use  . Smoking status: Former Smoker    Packs/day: 1.00    Years: 42.00    Pack years: 42.00    Types: Cigarettes    Last attempt to quit: 11/11/2010    Years since quitting: 7.1  . Smokeless tobacco: Never Used  Substance Use Topics  . Alcohol use: Yes    Alcohol/week: 4.0 standard drinks    Types: 4 Glasses of wine per week    Comment: wine (pt said it's very occ)  . Drug use: No      A shared decision-making session was conducted prior to the performance of CT scan. This includes one or more decision aids, includes benefits and harms of screening, follow-up diagnostic testing, over-diagnosis, false positive rate, and total radiation exposure.   Counseling on the importance of adherence to annual lung cancer LDCT screening, impact of co-morbidities, and ability or willingness to undergo diagnosis and treatment is imperative for compliance of the program.   Counseling on the importance of continued smoking cessation for former smokers; the importance of smoking cessation for current smokers, and information about tobacco cessation interventions have been given to patient including [place] and 1800 quit [place] programs.   Written order for lung cancer screening with LDCT has been given to the patient and any and all questions have been answered to the best of my abilities.    Yearly follow up will be coordinated by Burgess Estelle, Thoracic Navigator.  Beckey Rutter, DNP, AGNP-C [place] [facility] [phone] (work cell) [phone] (office) 01/18/18 11:29 AM

## 2018-01-20 ENCOUNTER — Encounter: Payer: Self-pay | Admitting: *Deleted

## 2018-01-26 ENCOUNTER — Encounter: Payer: Self-pay | Admitting: Family Medicine

## 2018-01-26 ENCOUNTER — Ambulatory Visit
Admission: RE | Admit: 2018-01-26 | Discharge: 2018-01-26 | Disposition: A | Discharge status: Home / Self Care | Payer: Medicare Other | Source: Ambulatory Visit | Attending: Family Medicine | Admitting: Family Medicine

## 2018-01-26 DIAGNOSIS — M810 Age-related osteoporosis without current pathological fracture: Secondary | ICD-10-CM | POA: Diagnosis not present

## 2018-01-26 DIAGNOSIS — M85851 Other specified disorders of bone density and structure, right thigh: Secondary | ICD-10-CM | POA: Diagnosis not present

## 2018-01-26 DIAGNOSIS — E2839 Other primary ovarian failure: Secondary | ICD-10-CM | POA: Diagnosis not present

## 2018-01-26 MED ORDER — TRETINOIN 0.1 % EX CREA: TOPICAL_CREAM | Freq: Every day | CUTANEOUS | 1 refills | Status: DC

## 2018-01-26 NOTE — Telephone Encounter (Signed)
Retin A 

## 2018-02-10 LAB — CUP PACEART REMOTE DEVICE CHECK
Date Time Interrogation Session: 20191218191912
Implantable Lead Implant Date: 20130830
Implantable Lead Implant Date: 20130830
Implantable Lead Location: 753859
Implantable Lead Location: 753860
Implantable Lead Model: 4456
Implantable Lead Serial Number: 523784
MDC IDC LEAD SERIAL: 473325
MDC IDC PG IMPLANT DT: 20130830
MDC IDC PG SERIAL: 134132

## 2018-03-09 ENCOUNTER — Ambulatory Visit (INDEPENDENT_AMBULATORY_CARE_PROVIDER_SITE_OTHER): Payer: Medicare Other

## 2018-03-09 DIAGNOSIS — I442 Atrioventricular block, complete: Secondary | ICD-10-CM

## 2018-03-10 DIAGNOSIS — M533 Sacrococcygeal disorders, not elsewhere classified: Secondary | ICD-10-CM | POA: Diagnosis not present

## 2018-03-10 DIAGNOSIS — M545 Low back pain: Secondary | ICD-10-CM | POA: Diagnosis not present

## 2018-03-10 NOTE — Progress Notes (Signed)
Remote pacemaker transmission.   

## 2018-03-12 LAB — CUP PACEART REMOTE DEVICE CHECK
Battery Remaining Percentage: 34 %
Brady Statistic RA Percent Paced: 7 %
Brady Statistic RV Percent Paced: 100 %
Implantable Lead Implant Date: 20130830
Implantable Lead Location: 753860
Implantable Lead Model: 4479
Lead Channel Impedance Value: 597 Ohm
Lead Channel Setting Pacing Amplitude: 2 V
Lead Channel Setting Pacing Amplitude: 2.4 V
Lead Channel Setting Pacing Pulse Width: 0.5 ms
MDC IDC LEAD IMPLANT DT: 20130830
MDC IDC LEAD LOCATION: 753859
MDC IDC LEAD SERIAL: 473325
MDC IDC LEAD SERIAL: 523784
MDC IDC MSMT BATTERY REMAINING LONGEVITY: 18 mo
MDC IDC MSMT LEADCHNL RA PACING THRESHOLD AMPLITUDE: 0.5 V
MDC IDC MSMT LEADCHNL RA PACING THRESHOLD PULSEWIDTH: 0.5 ms
MDC IDC MSMT LEADCHNL RV IMPEDANCE VALUE: 420 Ohm
MDC IDC PG IMPLANT DT: 20130830
MDC IDC SESS DTM: 20200115082100
MDC IDC SET LEADCHNL RV SENSING SENSITIVITY: 2.5 mV
Pulse Gen Serial Number: 134132

## 2018-03-15 DIAGNOSIS — M461 Sacroiliitis, not elsewhere classified: Secondary | ICD-10-CM | POA: Diagnosis not present

## 2018-03-16 DIAGNOSIS — R569 Unspecified convulsions: Secondary | ICD-10-CM | POA: Diagnosis not present

## 2018-03-16 DIAGNOSIS — G4733 Obstructive sleep apnea (adult) (pediatric): Secondary | ICD-10-CM | POA: Diagnosis not present

## 2018-04-05 ENCOUNTER — Other Ambulatory Visit: Payer: Self-pay | Admitting: Family Medicine

## 2018-04-05 DIAGNOSIS — M5416 Radiculopathy, lumbar region: Secondary | ICD-10-CM | POA: Diagnosis not present

## 2018-04-05 DIAGNOSIS — M461 Sacroiliitis, not elsewhere classified: Secondary | ICD-10-CM | POA: Diagnosis not present

## 2018-04-05 DIAGNOSIS — M5136 Other intervertebral disc degeneration, lumbar region: Secondary | ICD-10-CM | POA: Diagnosis not present

## 2018-04-06 NOTE — Telephone Encounter (Signed)
Name of Medication: Ambien Name of Pharmacy: [facility] or Written Date and Quantity: 10/09/17 #30 tabs with 5 refills Last Office Visit and Type: CPE on 12/15/17 Next Office Visit and Type: CPE on 01/04/2019 Last Controlled Substance Agreement Date: 05/22/15 Last UDS:05/22/15

## 2018-04-21 ENCOUNTER — Other Ambulatory Visit: Payer: Self-pay

## 2018-04-21 ENCOUNTER — Ambulatory Visit
Admission: RE | Admit: 2018-04-21 | Discharge: 2018-04-21 | Disposition: A | Discharge status: Home / Self Care | Payer: Medicare Other | Source: Ambulatory Visit | Attending: Radiation Oncology | Admitting: Radiation Oncology

## 2018-04-21 ENCOUNTER — Encounter: Payer: Self-pay | Admitting: Radiation Oncology

## 2018-04-21 VITALS — BP 119/56 | HR 69 | Temp 97.9°F | Resp 18 | Wt 175.3 lb

## 2018-04-21 DIAGNOSIS — C50411 Malignant neoplasm of upper-outer quadrant of right female breast: Secondary | ICD-10-CM

## 2018-04-21 DIAGNOSIS — Z170 Estrogen receptor positive status [ER+]: Principal | ICD-10-CM

## 2018-04-21 DIAGNOSIS — Z853 Personal history of malignant neoplasm of breast: Secondary | ICD-10-CM | POA: Diagnosis not present

## 2018-04-21 NOTE — Progress Notes (Signed)
Radiation Oncology Follow up Note  Name: Diana Cook   Date:   04/21/2018 MRN:  [id number] DOB: [birth year]    This 68 y.o. female presents to the clinic today for to year follow-up status post whole breast radiation to her right breast for ER/PR positive invasive mammary carcinoma.  REFERRING PROVIDER: Abner Greenspan, MD  HPI: patient is a 68 year old female now out 2 years having completed whole breast radiation to her right breast for ER/PR positive invasive mammary carcinoma. Seen today in routine follow-up she is doing well. She specifically denies breast tenderness cough or bone pain..she is not on aromatase inhibitor as she has discontinued based on side effect profile.her last mammograms which I have reviewed were BI-RADS 2 benign.  COMPLICATIONS OF TREATMENT: none  FOLLOW UP COMPLIANCE: keeps appointments   PHYSICAL EXAM:  BP (!) 119/56   Pulse 69   Temp 97.9 F (36.6 C)   Resp 18   Wt 175 lb 4.3 oz (79.5 kg)   BMI 25.88 kg/m  Lungs are clear to A&P cardiac examination essentially unremarkable with regular rate and rhythm. No dominant mass or nodularity is noted in either breast in 2 positions examined. Incision is well-healed. No axillary or supraclavicular adenopathy is appreciated. Cosmetic result is excellent.Well-developed well-nourished patient in NAD. HEENT reveals PERLA, EOMI, discs not visualized.  Oral cavity is clear. No oral mucosal lesions are identified. Neck is clear without evidence of cervical or supraclavicular adenopathy. Lungs are clear to A&P. Cardiac examination is essentially unremarkable with regular rate and rhythm without murmur rub or thrill. Abdomen is benign with no organomegaly or masses noted. Motor sensory and DTR levels are equal and symmetric in the upper and lower extremities. Cranial nerves II through XII are grossly intact. Proprioception is intact. No peripheral adenopathy or edema is identified. No motor or sensory levels are noted. Crude  visual fields are within normal range.  RADIOLOGY RESULTS: mammograms are reviewed and compatible with the above-stated findings  PLAN: at the present time patient is doing well with no evidence of disease 2 years out. I am please were overall progress. I've asked to see her back in 1 year for follow-up.she knows to call with any concerns at any time.  I would like to take this opportunity to thank you for allowing me to participate in the care of your patient.Noreene Filbert, MD

## 2018-04-22 DIAGNOSIS — M6281 Muscle weakness (generalized): Secondary | ICD-10-CM | POA: Diagnosis not present

## 2018-04-22 DIAGNOSIS — M2560 Stiffness of unspecified joint, not elsewhere classified: Secondary | ICD-10-CM | POA: Diagnosis not present

## 2018-04-22 DIAGNOSIS — M545 Low back pain: Secondary | ICD-10-CM | POA: Diagnosis not present

## 2018-04-26 DIAGNOSIS — M545 Low back pain: Secondary | ICD-10-CM | POA: Diagnosis not present

## 2018-04-27 DIAGNOSIS — M5136 Other intervertebral disc degeneration, lumbar region: Secondary | ICD-10-CM | POA: Diagnosis not present

## 2018-04-27 DIAGNOSIS — M5416 Radiculopathy, lumbar region: Secondary | ICD-10-CM | POA: Diagnosis not present

## 2018-04-30 DIAGNOSIS — M545 Low back pain: Secondary | ICD-10-CM | POA: Diagnosis not present

## 2018-05-05 DIAGNOSIS — M545 Low back pain: Secondary | ICD-10-CM | POA: Diagnosis not present

## 2018-05-07 DIAGNOSIS — M545 Low back pain: Secondary | ICD-10-CM | POA: Diagnosis not present

## 2018-05-19 ENCOUNTER — Other Ambulatory Visit: Payer: Self-pay

## 2018-05-19 DIAGNOSIS — M5136 Other intervertebral disc degeneration, lumbar region: Secondary | ICD-10-CM | POA: Diagnosis not present

## 2018-05-19 DIAGNOSIS — M5416 Radiculopathy, lumbar region: Secondary | ICD-10-CM | POA: Diagnosis not present

## 2018-06-01 NOTE — Progress Notes (Signed)
Napoleon Monacelli T. Jo Booze, MD Primary [facility] and [facility] at [facility] [place], [address] Phone: [phone]  FAX: [phone]  Diana Cook - 68 y.o. female  MRN [id number]  Date of Birth: 05-Feb-1951  Visit Date: 06/02/2018  PCP: Abner Greenspan, MD  Referred by: Tower, Wynelle Fanny, MD  Chief Complaint  Patient presents with  . Knee Pain    Left   Subjective:   Diana Cook is a 68 y.o. very pleasant female patient who presents with the following:  I last saw the patient 08/2017 and earlier in the summer I aspirated and injected her left knee and she did well.  Prior to this, I got a CT of her knee with concern for fracture.  At that time, I did a pes bursa injection as well.  In May 2019, I aspirated and injected her left knee, and she is done pretty well for almost a year after this.  She has an effusion now she is having difficulty moving her knee well.  She is having pain with extension and flexion.  She has no known injury at all.  She is having pain getting out of a car as well as going up and down stairs.  No giving way.  Past Medical History, Surgical History, Social History, Family History, Problem List, Medications, and Allergies have been reviewed and updated if relevant.  Patient Active Problem List   Diagnosis Date Noted  . Personal history of tobacco use, presenting hazards to health 01/18/2018  . Hypoxia 12/21/2017  . Grade I diastolic dysfunction 26/41/5830  . Globus pharyngeus 12/21/2017  . Pulmonary emphysema ([place]) 12/15/2017  . Screening for lipoid disorders 12/07/2017  . Seizures ([place]) 10/01/2016  . Elevated glucose 08/31/2016  . Grief reaction 04/11/2016  . Malignant neoplasm of upper-outer quadrant of right breast in female, estrogen receptor positive ([place]) 12/11/2015  . Osteoporosis 11/22/2015  . Estrogen deficiency 09/07/2015  . Bruising 09/07/2015  . Wrinkles 09/07/2015  . Post herpetic  neuralgia 09/01/2014  . Encounter for Medicare annual wellness exam 07/11/2014  . Routine general medical examination at a health care facility 06/26/2013  . H/O small bowel obstruction 04/15/2013  . SOB (shortness of breath) on exertion 02/10/2013  . Complete heart block ([place]) 11/03/2011  . [employer] 11/03/2011  . Lumbar spinal stenosis 09/02/2011  . POSTMENOPAUSAL STATUS 08/06/2009  . HIDRADENITIS SUPPURATIVA 06/26/2008  . HERNIATED DISC 12/14/2007  . BACK, LOWER, PAIN 12/02/2007  . HX, PERSONAL, COLONIC POLYPS 09/14/2006  . Former smoker 06/10/2006  . ASTHMA 06/10/2006  . H/O idiopathic seizure 06/10/2006    Past Medical History:  Diagnosis Date  . Allergic rhinitis   . Arthritis   . Asthma    as a child, mild now  . Breast cancer ([place]) 12/2015   right breast cancer, lumpectomy and mammosite   . Colon polyps    colonoscopy 7/08, tubular adenoma  . Complete heart block ([place]) 8/13   s/p PPM implanted in [facility]  . COPD (chronic obstructive pulmonary disease) ([place])   . Myocardial infarction ([place]) 2011  . Osteopenia 10/2015  . Pacemaker    2011  . Personal history of radiation therapy 2017   right breast ca, mammosite placed  . Seizure disorder ([place])   . Seizures ([place])    first one was when she was 68 years old   . Small bowel obstruction ([place])    1988 and 2002  .  Tobacco abuse     Past Surgical History:  Procedure Laterality Date  . ABDOMINAL HYSTERECTOMY    . BREAST BIOPSY Right 11/2005   benign inflammatory changes, mass due to underwire bra  . BREAST BIOPSY Left 01/02/2016   columnar cell changes without atypical hyperplasia.  Marland Kitchen BREAST EXCISIONAL BIOPSY Left 01/02/2016   COLUMNAR CELL CHANGE AND HYPERPLASIA ASSOCIATED WITH LUMINAL AND STROMAL CALCIFICATIONS  . BREAST LUMPECTOMY WITH SENTINEL LYMPH NODE BIOPSY Right 01/02/2016   pT1c, N0; ER/ PR 100%; Her 2 neu not over expressed: BREAST LUMPECTOMY WITH SENTINEL LYMPH NODE BX;  Surgeon:  [name], MD;  Location: [facility] ORS;  Service: General;  Laterality: Right;  . CATARACT EXTRACTION Bilateral   . COLONOSCOPY  10/2015   Dr Ardis Hughs  . EXPLORATORY LAPAROTOMY  01/25/2001   Exploratory laparotomy, lysis of adhesions, identification of internal hernia secondary to omental adhesion. Prolonged postoperative ileus.  Marland Kitchen gyn surgery  1993   hysterectomy- form endometriosis  . LAPAROSCOPY    . PACEMAKER INSERTION  10/24/11   [employer] Advantio dual chamber PPM implanted by Dr Bunnie Philips at [facility] in [place] History  . Marital status: Married    Spouse name: Not on file  . Number of children: Not on file  . Years of education: Not on file  . Highest education level: Not on file  Occupational History    Employer: RETIRED  Social Needs  . Financial resource strain: Not on file  . Food insecurity:    Worry: Not on file    Inability: Not on file  . Transportation needs:    Medical: Not on file    Non-medical: Not on file  Tobacco Use  . Smoking status: Former Smoker    Packs/day: 1.00    Years: 42.00    Pack years: 42.00    Types: Cigarettes    Last attempt to quit: 11/11/2010    Years since quitting: 7.5  . Smokeless tobacco: Never Used  Substance and Sexual Activity  . Alcohol use: Yes    Alcohol/week: 4.0 standard drinks    Types: 4 Glasses of wine per week    Comment: wine (pt said it's very occ)  . Drug use: No  . Sexual activity: Never  Lifestyle  . Physical activity:    Days per week: Not on file    Minutes per session: Not on file  . Stress: Not on file  Relationships  . Social connections:    Talks on phone: Not on file    Gets together: Not on file    Attends religious service: Not on file    Active member of club or organization: Not on file    Attends meetings of clubs or organizations: Not on file    Relationship status: Not on file  . Intimate partner violence:    Fear of  current or ex partner: Not on file    Emotionally abused: Not on file    Physically abused: Not on file    Forced sexual activity: Not on file  Other Topics Concern  . Not on file  Social History Narrative   Married 40 years.   Exercises, does walking tapes    Family History  Problem Relation Age of Onset  . Stroke Mother   . Heart disease Mother        MI  . Dementia Mother   . Coronary artery disease Father   .  Parkinsonism Father   . Cancer - Cervical Daughter 58       died 2022-03-25  . Colon cancer Neg Hx   . Breast cancer Neg Hx     Allergies  Allergen Reactions  . Codeine Nausea And Vomiting    REACTION: nausea  . Morphine And Related Nausea Only    Nausea     Medication list reviewed and updated in full in [place].  GEN: No fevers, chills. Nontoxic. Primarily MSK c/o today. MSK: Detailed in the HPI GI: tolerating PO intake without difficulty Neuro: No numbness, parasthesias, or tingling associated. Otherwise the pertinent positives of the ROS are noted above.   Objective:   BP 110/62   Pulse 72   Temp 97.8 F (36.6 C) (Oral)   Ht 5' 7.5" (1.715 m)   Wt 176 lb (79.8 kg)   BMI 27.16 kg/m    GEN: WDWN, NAD, Non-toxic, Alert & Oriented x 3 HEENT: Atraumatic, Normocephalic.  Ears and Nose: No external deformity. EXTR: No clubbing/cyanosis/edema PSYCH: Normally interactive. Conversant. Not depressed or anxious appearing.  Calm demeanor.   Knee:  L Gait: Normal heel toe pattern, antalgia ROM: loss 3 deg ext, flexion to 105 Effusion: mod Echymosis or edema: none Patellar tendon NT Painful PLICA: neg Patellar grind: negative Medial and lateral patellar facet loading: negative medial and lateral joint lines: mild ttp Mcmurray's neg Flexion-pinch neg Varus and valgus stress: stable Lachman: neg Ant and Post drawer: neg Hip abduction, IR, ER: WNL Hip flexion str: 5/5 Hip abd: 5/5 Quad: 5/5 VMO atrophy:No Hamstring concentric and eccentric:  5/5   Radiology: CLINICAL DATA:  Acute medial left knee pain since an injury on 07/02/2017.  EXAM: CT OF THE LEFT KNEE WITHOUT CONTRAST  TECHNIQUE: Multidetector CT imaging of the left knee was performed according to the standard protocol. Multiplanar CT image reconstructions were also generated.  COMPARISON:  Radiographs dated 07/13/2017  FINDINGS: Bones/Joint/Cartilage  There is no fracture or dislocation. There is medial joint space narrowing with tiny tricompartmental marginal osteophytes. Prominent joint effusion. No Baker's cyst.  Ligaments  Suboptimally assessed by CT. Cruciate and collateral ligaments appear to be intact.  Muscles and Tendons  Distal quadriceps tendon and patellar tendon are intact.  Soft tissues  The adjacent soft tissues are normal.  Menisci are not well enough seen for evaluation on this CT scan.  IMPRESSION: 1. Large joint effusion. 2. Slight arthritic changes of the knee primarily in the medial compartment. 3. No fractures. 4. I cannot exclude meniscal tears.   Electronically Signed   By: Lorriane Shire M.D.   On: 07/22/2017 16:07  Assessment and Plan:   Primary osteoarthritis of left knee - Plan: methylPREDNISolone acetate (DEPO-MEDROL) injection 80 mg  Chronic pain of left knee  Pacemaker-[employer]  Recurrent knee pain and effusion.  No clear injury at this time, but she did have a knee injury greater than 1 year ago.  She has done well in the interval time since of seen her.  We discussed options in a setting of recurrent knee pain and effusion.  She does have a pacemaker, so she cannot have an MRI.  I have already done a CT of her knee.  Risk of some sort of internal derangement is high.  If symptoms persist over time without resolution with conservative care, then surgical consultation would be reasonable for definitive management.  I did not do an aspiration and injection of her left knee again  today.  Aspiration/Injection Procedure  Note Diana Cook 12-27-1950 Date of procedure: 06/02/2018  Procedure: Large Joint Aspiration / Injection with synovial fluid aspiration of knee, L Indications: Pain  Procedure Details Patient verbally consented; risks, benefits, and alternatives explained including possible infection. Patient prepped with Chloraprep. Ethyl chloride for anesthesia. 2 cc of 1% Lidocaine used in wheal then injected Subcutaneous fashion with 22 gauge needle on lateral approach, but with aspiration, I got some blood so stopped anesthesia. Under sterilne conditions, 18 gauge needle used via lateral approach to aspirate 40 cc of blood-tinged synovial fluid. Then 2 mL of Depo-Medrol 40 mg injected. Tolerated well, decreased pain, no complications. Medication: 2 mL of Depo-Medrol 40 mg, equaling Depo-Medrol 80 mg total   Follow-up: prn  Meds ordered this encounter  Medications  . methylPREDNISolone acetate (DEPO-MEDROL) injection 80 mg   Signed,  Khilynn Borntreger T. Jaslynn Thome, MD   Outpatient Encounter Medications as of 06/02/2018  Medication Sig  . acetaminophen (TYLENOL) 500 MG tablet Take 500 mg by mouth every 6 (six) hours as needed.  . Aclidinium Br-Formoterol Fum (DUAKLIR PRESSAIR) 400-12 MCG/ACT AEPB Inhale 1 puff into the lungs daily.  Marland Kitchen albuterol (PROVENTIL HFA;VENTOLIN HFA) 108 (90 Base) MCG/ACT inhaler Inhale 2 puffs into the lungs every 4 (four) hours as needed for wheezing.  . Calcium-Vitamin D-Vitamin K 553-748-27 MG-UNT-MCG TABS Take 1 tablet by mouth 2 (two) times daily.   Marland Kitchen FLUoxetine (PROZAC) 20 MG capsule Take 1 capsule (20 mg total) by mouth daily.  . furosemide (LASIX) 20 MG tablet TAKE 1 TABLET BY MOUTH ONCE DAILY AS NEEDED  . lamoTRIgine (LAMICTAL) 100 MG tablet Take 1 tablet by mouth 2 (two) times daily.  Marland Kitchen tretinoin (RETIN-A) 0.1 % cream Apply topically at bedtime.  Marland Kitchen zolpidem (AMBIEN) 10 MG tablet TAKE 1/2 TO 1 (ONE-HALF TO ONE) TABLET BY MOUTH AT BEDTIME  AS NEEDED FOR SLEEP  . [EXPIRED] methylPREDNISolone acetate (DEPO-MEDROL) injection 80 mg    No facility-administered encounter medications on file as of 06/02/2018.

## 2018-06-02 ENCOUNTER — Encounter: Payer: Self-pay | Admitting: Family Medicine

## 2018-06-02 ENCOUNTER — Ambulatory Visit (INDEPENDENT_AMBULATORY_CARE_PROVIDER_SITE_OTHER): Payer: Medicare Other | Admitting: Family Medicine

## 2018-06-02 ENCOUNTER — Other Ambulatory Visit: Payer: Self-pay

## 2018-06-02 VITALS — BP 110/62 | HR 72 | Temp 97.8°F | Ht 67.5 in | Wt 176.0 lb

## 2018-06-02 DIAGNOSIS — M25562 Pain in left knee: Secondary | ICD-10-CM | POA: Diagnosis not present

## 2018-06-02 DIAGNOSIS — G8929 Other chronic pain: Secondary | ICD-10-CM | POA: Diagnosis not present

## 2018-06-02 DIAGNOSIS — M1712 Unilateral primary osteoarthritis, left knee: Secondary | ICD-10-CM | POA: Diagnosis not present

## 2018-06-02 DIAGNOSIS — Z950 Presence of cardiac pacemaker: Secondary | ICD-10-CM

## 2018-06-02 MED ORDER — METHYLPREDNISOLONE ACETATE 40 MG/ML IJ SUSP
80.0000 mg | Freq: Once | INTRAMUSCULAR | Status: AC
  Administered 2018-06-02: 15:00:00 80 mg via INTRA_ARTICULAR

## 2018-06-08 DIAGNOSIS — M5136 Other intervertebral disc degeneration, lumbar region: Secondary | ICD-10-CM | POA: Diagnosis not present

## 2018-06-09 ENCOUNTER — Ambulatory Visit (INDEPENDENT_AMBULATORY_CARE_PROVIDER_SITE_OTHER): Payer: Medicare Other | Admitting: *Deleted

## 2018-06-09 ENCOUNTER — Other Ambulatory Visit: Payer: Self-pay

## 2018-06-09 DIAGNOSIS — I442 Atrioventricular block, complete: Secondary | ICD-10-CM

## 2018-06-09 LAB — CUP PACEART REMOTE DEVICE CHECK
Battery Remaining Longevity: 18 mo
Battery Remaining Percentage: 30 %
Brady Statistic RA Percent Paced: 7 %
Brady Statistic RV Percent Paced: 100 %
Date Time Interrogation Session: 20200415095300
Implantable Lead Implant Date: 20130830
Implantable Lead Implant Date: 20130830
Implantable Lead Location: 753859
Implantable Lead Location: 753860
Implantable Lead Model: 4456
Implantable Lead Model: 4479
Implantable Lead Serial Number: 473325
Implantable Lead Serial Number: 523784
Implantable Pulse Generator Implant Date: 20130830
Lead Channel Impedance Value: 424 Ohm
Lead Channel Impedance Value: 609 Ohm
Lead Channel Pacing Threshold Amplitude: 0.5 V
Lead Channel Pacing Threshold Pulse Width: 0.5 ms
Lead Channel Setting Pacing Amplitude: 2 V
Lead Channel Setting Pacing Amplitude: 2.4 V
Lead Channel Setting Pacing Pulse Width: 0.5 ms
Lead Channel Setting Sensing Sensitivity: 2.5 mV
Pulse Gen Serial Number: 134132

## 2018-06-15 NOTE — Progress Notes (Signed)
Remote pacemaker transmission.   

## 2018-07-23 ENCOUNTER — Telehealth: Payer: Self-pay | Admitting: Pulmonary Disease

## 2018-07-23 NOTE — Telephone Encounter (Signed)
Pt is scheduled for office visit with LG on 07/28/2018 at 1:30. Dr. Patsey Berthold has a bronch at 1:00 that day.  Left message requesting that pt's appt be switched to that morning.  1:30 slot will need to be blocked, once pt is rescheduled.

## 2018-07-26 NOTE — Telephone Encounter (Signed)
Please see 07/24/2018 mychart encounter.  Appointment time has been changed to 10:30a. Nothing further is needed at this time.

## 2018-07-27 ENCOUNTER — Telehealth: Payer: Self-pay | Admitting: Pulmonary Disease

## 2018-07-27 ENCOUNTER — Ambulatory Visit: Payer: Medicare Other | Admitting: Pulmonary Disease

## 2018-07-27 NOTE — Telephone Encounter (Signed)
Called patient for COVID-19 pre-screening for in office visit.  Have you recently traveled any where out of the local area in the last 2 weeks? NO  Have you been in close contact with a person diagnosed with COVID-19 within the last 2 weeks? NO  Do you currently have any of the following symptoms? If so, when did they start? Cough     Diarrhea   Joint Pain Fever      Muscle Pain   Red eyes Shortness of breath   Abdominal pain  Vomiting Loss of smell    Rash    Sore Throat Headache    Weakness   Bruising or bleeding   Okay to proceed with visit 07/27/2018

## 2018-07-28 ENCOUNTER — Other Ambulatory Visit: Payer: Self-pay

## 2018-07-28 ENCOUNTER — Encounter: Payer: Self-pay | Admitting: Pulmonary Disease

## 2018-07-28 ENCOUNTER — Ambulatory Visit (INDEPENDENT_AMBULATORY_CARE_PROVIDER_SITE_OTHER): Payer: Medicare Other | Admitting: Pulmonary Disease

## 2018-07-28 VITALS — BP 120/72 | HR 71 | Temp 97.6°F | Ht 67.5 in | Wt 179.8 lb

## 2018-07-28 DIAGNOSIS — K2100 Gastro-esophageal reflux disease with esophagitis, without bleeding: Secondary | ICD-10-CM

## 2018-07-28 DIAGNOSIS — R0989 Other specified symptoms and signs involving the circulatory and respiratory systems: Secondary | ICD-10-CM

## 2018-07-28 DIAGNOSIS — R0600 Dyspnea, unspecified: Secondary | ICD-10-CM

## 2018-07-28 DIAGNOSIS — J439 Emphysema, unspecified: Secondary | ICD-10-CM | POA: Diagnosis not present

## 2018-07-28 DIAGNOSIS — G4734 Idiopathic sleep related nonobstructive alveolar hypoventilation: Secondary | ICD-10-CM | POA: Diagnosis not present

## 2018-07-28 NOTE — Progress Notes (Signed)
Subjective:    Patient ID: Diana Cook, female    DOB: Aug 28, 1950, 68 y.o.   MRN: [id number]  HPI This is a 68 year old former smoker, who presents for follow-up of dyspnea on exertion.  She was initially evaluated on 21 December 2017.  Her issues with dyspnea on exertion have resolved.  On her initial evaluation she was noted to have significant hypoxemia with exercise and nocturnal hypoxemia was confirmed by overnight oximetry.  Since that time we have been able to wean her off of oxygen.  He was given a trial of maintenance inhaler however she discontinued this.  She is only on albuterol as needed.  She has been having issues with globus sensation and reflux symptoms.  She is currently on omeprazole.  Of note her CT scan of the lung done previously showed fluid levels in the esophagus which suggests esophageal dysmotility or gastroesophageal reflux.  Patient has had no fevers, chills or sweats she voices no other complaint.  She feels well and looks well.  Patient did request evaluation to see if she can be taken off nocturnal oxygen.  Review of Systems  Constitutional: Positive for activity change (More active). Negative for appetite change, fatigue and unexpected weight change.  Eyes: Negative.   Respiratory: Positive for shortness of breath (Improved). Negative for chest tightness and wheezing.   Cardiovascular: Negative.   Gastrointestinal: Negative.   Endocrine: Negative.   Musculoskeletal: Negative.   Skin: Negative.   Allergic/Immunologic: Negative.   Neurological: Negative.   Hematological: Negative.   Psychiatric/Behavioral: Negative.   All other systems reviewed and are negative.      Objective:   Physical Exam Vitals signs and nursing note reviewed.  Constitutional:      Appearance: She is well-developed. She is not ill-appearing or toxic-appearing.  HENT:     Head: Normocephalic and atraumatic.     Nose:     Comments: Nose/mouth/throat not examined due to masking  requirements for COVID 19. Eyes:     General: No scleral icterus.    Conjunctiva/sclera: Conjunctivae normal.     Pupils: Pupils are equal, round, and reactive to light.  Neck:     Musculoskeletal: Neck supple.     Thyroid: No thyromegaly.     Vascular: No JVD.     Trachea: No tracheal deviation.  Cardiovascular:     Rate and Rhythm: Normal rate and regular rhythm.     Heart sounds: Normal heart sounds.  Pulmonary:     Effort: Pulmonary effort is normal.     Breath sounds: Normal breath sounds. No wheezing, rhonchi or rales.  Abdominal:     General: There is no distension.     Palpations: Abdomen is soft.  Musculoskeletal: Normal range of motion.     Right lower leg: Normal. No edema.     Left lower leg: Normal. No edema.  Lymphadenopathy:     Cervical: No cervical adenopathy.  Skin:    General: Skin is warm.     Findings: No erythema or rash.     Nails: There is no clubbing.   Neurological:     Mental Status: She is alert and oriented to person, place, and time.     Comments: No focal deficits.  Psychiatric:        Behavior: Behavior normal.           Assessment & Plan:   1)Dyspnea:the patient's dyspnea is related to several issues one is likely COPD with emphysema and associated hypoxemia likely  due to diffusion capacity impairment.  Her dyspnea has improved dramatically with supplemental nocturnal oxygen for treatment of nocturnal hypoxemia associated with emphysema. Pulmonary function testing confirms emphysema with preserved FEV1/FVC.  Patient is currently not using maintenance inhalers and only using as needed albuterol.  She has not required this medication for several months.  2) Nocturnal hypoxemia:  Patient would like to come off of nocturnal oxygen.  I recommended performing another overnight oximetry to determine whether this can be done safely.  3)COPD on the basis of pulmonary emphysema, with preserved FEV1/FVC: She did not find relief with LABA/LAMA  combination inhaler.  She feels that using as needed albuterol is sufficient at present.  She has minimal obstruction on pulmonary function testing with preserved FEV1/FVC and significantly decreased diffusion capacity which is not unusual to see in emphysema.  She have evidence of increased airway resistance.  She may develop further issues with FEV1 decline and she will have to be monitored carefully.  At present she does not need a maintenance inhaler and is actually declining one.  Continue use of as needed albuterol.  4)Globus sensation likely related to GERD versus issues with esophageal dysmotility: Referral to GI.  5)Past history of heavy smoking, the patient has agreed to pursue low-dose CT for lung cancer screening.  Will be referred for the same.  We will see the patient in follow-up in 6  months time she is to contact us prior to that time should any new difficulties arise.   This chart was dictated using voice recognition software/Dragon.  Despite best efforts to proofread, errors can occur which can change the meaning.  Any change was purely unintentional.

## 2018-07-28 NOTE — Patient Instructions (Signed)
1.  Went with gastroenterology (GI) to evaluate your esophageal dysmotility and reflux.  2.  You will have another overnight oximetry (oxygen level measurement) when this will be done without using your oxygen on the night of the recording.  We will call you with those results after it is done.  3.  We will see you in follow-up in 6 months time.

## 2018-07-29 ENCOUNTER — Ambulatory Visit (INDEPENDENT_AMBULATORY_CARE_PROVIDER_SITE_OTHER): Payer: Medicare Other | Admitting: Gastroenterology

## 2018-07-29 ENCOUNTER — Other Ambulatory Visit: Payer: Self-pay

## 2018-07-29 ENCOUNTER — Encounter: Payer: Self-pay | Admitting: Gastroenterology

## 2018-07-29 VITALS — BP 122/58 | HR 62 | Temp 98.1°F | Wt 180.6 lb

## 2018-07-29 DIAGNOSIS — K219 Gastro-esophageal reflux disease without esophagitis: Secondary | ICD-10-CM | POA: Diagnosis not present

## 2018-07-29 DIAGNOSIS — Z1211 Encounter for screening for malignant neoplasm of colon: Secondary | ICD-10-CM

## 2018-07-29 NOTE — Progress Notes (Signed)
Diana Cook [address]  [place]  [place], [place] [address]  Main: [phone]  Fax: [phone]   Gastroenterology Consultation  Referring Provider:     Tyler Pita, MD Primary Care Physician:  Tower, Wynelle Fanny, MD Reason for Consultation:     Reflux        HPI:   Chief complaint: Reflux  Diana Cook is a 68 y.o. y/o female referred for consultation & management  by Dr. Glori Bickers, Wynelle Fanny, MD.  Patient reports intermittent episodes of feeling like she "swallowed wrong", which causes her to have a coughing spell or feels short of breath.  And then the episode passes.  States these are very occasional.  These are not associated with any heartburn.  She denies any heartburn whatsoever.  Denies any dysphagia to liquids or solids.  No weight loss.  The patient denies abdominal or flank pain, anorexia, nausea or vomiting, dysphagia, change in bowel habits or black or bloody stools or weight loss.  Her pulmonologist told her of an abnormality of the esophagus on imaging and referred her to [place].  They are referring to the CT chest that she had in September 2019 reporting "esophageal air-fluid level suggests this possibility or gastroesophageal reflux."  Last colonoscopy, Dr. Olena Heckle, and September 2017 for polyp surveillance.  4 polyps removed, 4 to 10 mm in size.  Pathology showed sessile serrated polyp.  Repeat colonoscopy recommended in 3 years.  Past Medical History:  Diagnosis Date  . Allergic rhinitis   . Arthritis   . Asthma    as a child, mild now  . Breast cancer ([place]) 12/2015   right breast cancer, lumpectomy and mammosite   . Colon polyps    colonoscopy 7/08, tubular adenoma  . Complete heart block ([place]) 8/13   s/p PPM implanted in [facility]  . COPD (chronic obstructive pulmonary disease) ([place])   . Myocardial infarction ([place]) 2011  . Osteopenia 10/2015  . Pacemaker    2011  . Personal history of radiation therapy 2017   right breast ca,  mammosite placed  . Seizure disorder ([place])   . Seizures ([place])    first one was when she was 68 years old   . Small bowel obstruction ([place])    1988 and 2002  . Tobacco abuse     Past Surgical History:  Procedure Laterality Date  . ABDOMINAL HYSTERECTOMY    . BREAST BIOPSY Right 11/2005   benign inflammatory changes, mass due to underwire bra  . BREAST BIOPSY Left 01/02/2016   columnar cell changes without atypical hyperplasia.  Marland Kitchen BREAST EXCISIONAL BIOPSY Left 01/02/2016   COLUMNAR CELL CHANGE AND HYPERPLASIA ASSOCIATED WITH LUMINAL AND STROMAL CALCIFICATIONS  . BREAST LUMPECTOMY WITH SENTINEL LYMPH NODE BIOPSY Right 01/02/2016   pT1c, N0; ER/ PR 100%; Her 2 neu not over expressed: BREAST LUMPECTOMY WITH SENTINEL LYMPH NODE BX;  Surgeon: [name], MD;  Location: [facility] ORS;  Service: General;  Laterality: Right;  . CATARACT EXTRACTION Bilateral   . COLONOSCOPY  10/2015   Dr Ardis Hughs  . EXPLORATORY LAPAROTOMY  01/25/2001   Exploratory laparotomy, lysis of adhesions, identification of internal hernia secondary to omental adhesion. Prolonged postoperative ileus.  Marland Kitchen gyn surgery  1993   hysterectomy- form endometriosis  . LAPAROSCOPY    . PACEMAKER INSERTION  10/24/11   [employer] Advantio dual chamber PPM implanted by Dr Bunnie Philips at [facility] in [place]    Prior to  Admission medications   Medication Sig Start Date End Date Taking? Authorizing Provider  acetaminophen (TYLENOL) 500 MG tablet Take 500 mg by mouth every 6 (six) hours as needed.    [provider]  Aclidinium Br-Formoterol Fum (DUAKLIR PRESSAIR) 400-12 MCG/ACT AEPB Inhale 1 puff into the lungs daily. 01/05/18   Tyler Pita, MD  albuterol (PROVENTIL HFA;VENTOLIN HFA) 108 (90 Base) MCG/ACT inhaler Inhale 2 puffs into the lungs every 4 (four) hours as needed for wheezing. 12/15/17   Tower, Wynelle Fanny, MD  Calcium-Vitamin D-Vitamin K 829-937-16 MG-UNT-MCG TABS Take 1 tablet by mouth  2 (two) times daily.     [provider]  FLUoxetine (PROZAC) 20 MG capsule Take 1 capsule (20 mg total) by mouth daily. 12/15/17   Tower, Wynelle Fanny, MD  furosemide (LASIX) 20 MG tablet TAKE 1 TABLET BY MOUTH ONCE DAILY AS NEEDED 09/08/17   Deboraha Sprang, MD  lamoTRIgine (LAMICTAL) 100 MG tablet Take 1 tablet by mouth 2 (two) times daily. 11/03/17   [provider]  tretinoin (RETIN-A) 0.1 % cream Apply topically at bedtime. 01/26/18   Tower, Wynelle Fanny, MD  zolpidem (AMBIEN) 10 MG tablet TAKE 1/2 TO 1 (ONE-HALF TO ONE) TABLET BY MOUTH AT BEDTIME AS NEEDED FOR SLEEP 04/06/18   Tower, Wynelle Fanny, MD    Family History  Problem Relation Age of Onset  . Stroke Mother   . Heart disease Mother        MI  . Dementia Mother   . Coronary artery disease Father   . Parkinsonism Father   . Cancer - Cervical Daughter 81       died 04/01/2022  . Colon cancer Neg Hx   . Breast cancer Neg Hx      Social History   Tobacco Use  . Smoking status: Former Smoker    Packs/day: 1.00    Years: 42.00    Pack years: 42.00    Types: Cigarettes    Last attempt to quit: 11/11/2010    Years since quitting: 7.7  . Smokeless tobacco: Never Used  Substance Use Topics  . Alcohol use: Yes    Alcohol/week: 4.0 standard drinks    Types: 4 Glasses of wine per week    Comment: wine (pt said it's very occ)  . Drug use: No    Allergies as of 07/29/2018 - Review Complete 07/28/2018  Allergen Reaction Noted  . Codeine Nausea And Vomiting 06/14/2007  . Morphine and related Nausea Only 11/03/2011    Review of Systems:    All systems reviewed and negative except where noted in HPI.   Physical Exam:   Vitals:   07/29/18 1557  BP: (!) 122/58  Pulse: 62  Temp: 98.1 F (36.7 C)  Weight: 180 lb 9.6 oz (81.9 kg)    No LMP recorded. Patient has had a hysterectomy. Psych:  Alert and cooperative. Normal mood and affect. General:   Alert,  Well-developed, well-nourished, pleasant and cooperative in NAD  Head:  Normocephalic and atraumatic. Eyes:  Sclera clear, no icterus.   Conjunctiva pink. Ears:  Normal auditory acuity. Nose:  No deformity, discharge, or lesions. Mouth:  No deformity or lesions,oropharynx pink & moist. Neck:  Supple; no masses or thyromegaly. Abdomen:  Normal bowel sounds.  No bruits.  Soft, non-tender and non-distended without masses, hepatosplenomegaly or hernias noted.  No guarding or rebound tenderness.    Msk:  Symmetrical without gross deformities. Good, equal movement & strength bilaterally. Pulses:  Normal pulses  noted. Extremities:  No clubbing or edema.  No cyanosis. Neurologic:  Alert and oriented x3;  grossly normal neurologically. Skin:  Intact without significant lesions or rashes. No jaundice. Lymph Nodes:  No significant cervical adenopathy. Psych:  Alert and cooperative. Normal mood and affect.   Labs: CBC    Component Value Date/Time   WBC 6.7 12/08/2017 1231   RBC 4.69 12/08/2017 1231   HGB 13.9 12/08/2017 1231   HGB 13.0 07/21/2013 1517   HCT 41.7 12/08/2017 1231   HCT 39.5 07/21/2013 1517   PLT 314.0 12/08/2017 1231   PLT 252 07/21/2013 1517   MCV 88.9 12/08/2017 1231   MCV 90 07/21/2013 1517   MCH 30.7 10/01/2016 0117   MCHC 33.4 12/08/2017 1231   RDW 13.1 12/08/2017 1231   RDW 13.6 07/21/2013 1517   LYMPHSABS 1.2 12/08/2017 1231   LYMPHSABS 0.9 (L) 07/21/2013 1517   MONOABS 0.6 12/08/2017 1231   MONOABS 0.6 07/21/2013 1517   EOSABS 0.2 12/08/2017 1231   EOSABS 0.1 07/21/2013 1517   BASOSABS 0.0 12/08/2017 1231   BASOSABS 0.1 07/21/2013 1517   CMP     Component Value Date/Time   NA 134 (L) 12/08/2017 1231   NA 136 07/21/2013 1517   K 4.5 12/08/2017 1231   K 4.2 07/21/2013 1517   CL 100 12/08/2017 1231   CL 105 07/21/2013 1517   CO2 28 12/08/2017 1231   CO2 22 07/21/2013 1517   GLUCOSE 98 12/08/2017 1231   GLUCOSE 131 (H) 07/21/2013 1517   BUN 28 (H) 12/08/2017 1231   BUN 19 (H) 07/21/2013 1517   CREATININE 0.95  12/08/2017 1231   CREATININE 0.94 07/21/2013 1517   CALCIUM 10.5 12/08/2017 1231   CALCIUM 9.4 07/21/2013 1517   PROT 7.8 12/08/2017 1231   PROT 7.7 07/21/2013 1517   ALBUMIN 4.5 12/08/2017 1231   ALBUMIN 3.8 07/21/2013 1517   AST 17 12/08/2017 1231   AST 31 07/21/2013 1517   ALT 14 12/08/2017 1231   ALT 17 07/21/2013 1517   ALKPHOS 78 12/08/2017 1231   ALKPHOS 105 07/21/2013 1517   BILITOT 0.6 12/08/2017 1231   BILITOT 0.6 07/21/2013 1517   GFRNONAA >60 10/01/2016 0117   GFRNONAA >60 07/21/2013 1517   GFRAA >60 10/01/2016 0117   GFRAA >60 07/21/2013 1517    Imaging Studies: No results found.  Assessment and Plan:   CACIE GASKINS is a 68 y.o. y/o female has been referred for reflux  The CT finding of esophageal air-fluid levels suggesting dysmotility or gastroesophageal reflux in November 2019 is a nonspecific finding  patient specifically denies any heartburn or dysphagia.  She does not have any alarm symptoms.  however, her vague symptoms of coughing spells may or may not be related to reflux.  given the abnormal CT we discussed further evaluation with upper endoscopy to rule out underlying lesions.  Patient is interested in proceeding with this.  We discussed that if this is normal, I does not rule out reflux and a pH monitoring study may be needed for that in the future.  She verbalized understanding.  Next  will not start reflux medications given that she does not have any heartburn or typical reflux symptoms at this time.  She is also due for polyp surveillance given that last colonoscopy 2017 recommended 3-year polyp surveillance due to 4 precancerous polyps removed.  Patient interested in scheduling this along with her EGD as well  I have discussed alternative options, risks & benefits,  which include, but are not limited to, bleeding, infection, perforation,respiratory complication & drug reaction.  The patient agrees with this plan & written consent will be  obtained.     Dr Diana Cook  Speech recognition software was used to dictate the above note.

## 2018-08-16 ENCOUNTER — Other Ambulatory Visit: Admission: RE | Admit: 2018-08-16 | Payer: Medicare Other | Source: Ambulatory Visit

## 2018-08-17 ENCOUNTER — Other Ambulatory Visit
Admission: RE | Admit: 2018-08-17 | Discharge: 2018-08-17 | Disposition: A | Discharge status: Home / Self Care | Payer: Medicare Other | Source: Ambulatory Visit | Attending: Gastroenterology | Admitting: Gastroenterology

## 2018-08-17 ENCOUNTER — Other Ambulatory Visit: Payer: Self-pay

## 2018-08-17 DIAGNOSIS — Z1159 Encounter for screening for other viral diseases: Secondary | ICD-10-CM | POA: Insufficient documentation

## 2018-08-18 ENCOUNTER — Encounter: Payer: Self-pay | Admitting: Anesthesiology

## 2018-08-19 ENCOUNTER — Ambulatory Visit
Admission: RE | Admit: 2018-08-19 | Discharge: 2018-08-19 | Disposition: A | Discharge status: Home / Self Care | Payer: Medicare Other | Attending: Gastroenterology | Admitting: Gastroenterology

## 2018-08-19 ENCOUNTER — Ambulatory Visit: Payer: Medicare Other | Admitting: Anesthesiology

## 2018-08-19 ENCOUNTER — Encounter
Admission: RE | Disposition: A | Discharge status: Home / Self Care | Payer: Self-pay | Source: Home / Self Care | Attending: Gastroenterology

## 2018-08-19 ENCOUNTER — Other Ambulatory Visit: Payer: Self-pay

## 2018-08-19 DIAGNOSIS — K635 Polyp of colon: Secondary | ICD-10-CM | POA: Insufficient documentation

## 2018-08-19 DIAGNOSIS — Z950 Presence of cardiac pacemaker: Secondary | ICD-10-CM | POA: Insufficient documentation

## 2018-08-19 DIAGNOSIS — Z853 Personal history of malignant neoplasm of breast: Secondary | ICD-10-CM | POA: Insufficient documentation

## 2018-08-19 DIAGNOSIS — I252 Old myocardial infarction: Secondary | ICD-10-CM | POA: Insufficient documentation

## 2018-08-19 DIAGNOSIS — Z86010 Personal history of colonic polyps: Secondary | ICD-10-CM | POA: Insufficient documentation

## 2018-08-19 DIAGNOSIS — K219 Gastro-esophageal reflux disease without esophagitis: Secondary | ICD-10-CM

## 2018-08-19 DIAGNOSIS — B9681 Helicobacter pylori [H. pylori] as the cause of diseases classified elsewhere: Secondary | ICD-10-CM | POA: Insufficient documentation

## 2018-08-19 DIAGNOSIS — Z923 Personal history of irradiation: Secondary | ICD-10-CM | POA: Diagnosis not present

## 2018-08-19 DIAGNOSIS — K2900 Acute gastritis without bleeding: Secondary | ICD-10-CM | POA: Diagnosis not present

## 2018-08-19 DIAGNOSIS — Z87891 Personal history of nicotine dependence: Secondary | ICD-10-CM | POA: Diagnosis not present

## 2018-08-19 DIAGNOSIS — J449 Chronic obstructive pulmonary disease, unspecified: Secondary | ICD-10-CM | POA: Diagnosis not present

## 2018-08-19 DIAGNOSIS — K2970 Gastritis, unspecified, without bleeding: Secondary | ICD-10-CM | POA: Diagnosis not present

## 2018-08-19 DIAGNOSIS — Z09 Encounter for follow-up examination after completed treatment for conditions other than malignant neoplasm: Secondary | ICD-10-CM | POA: Diagnosis not present

## 2018-08-19 DIAGNOSIS — D126 Benign neoplasm of colon, unspecified: Secondary | ICD-10-CM | POA: Diagnosis not present

## 2018-08-19 DIAGNOSIS — G40909 Epilepsy, unspecified, not intractable, without status epilepticus: Secondary | ICD-10-CM | POA: Diagnosis not present

## 2018-08-19 DIAGNOSIS — Z79899 Other long term (current) drug therapy: Secondary | ICD-10-CM | POA: Diagnosis not present

## 2018-08-19 DIAGNOSIS — Z1211 Encounter for screening for malignant neoplasm of colon: Secondary | ICD-10-CM

## 2018-08-19 DIAGNOSIS — R933 Abnormal findings on diagnostic imaging of other parts of digestive tract: Secondary | ICD-10-CM | POA: Insufficient documentation

## 2018-08-19 DIAGNOSIS — D122 Benign neoplasm of ascending colon: Secondary | ICD-10-CM

## 2018-08-19 DIAGNOSIS — K3189 Other diseases of stomach and duodenum: Secondary | ICD-10-CM

## 2018-08-19 HISTORY — PX: COLONOSCOPY WITH PROPOFOL: SHX5780

## 2018-08-19 HISTORY — PX: ESOPHAGOGASTRODUODENOSCOPY (EGD) WITH PROPOFOL: SHX5813

## 2018-08-19 LAB — NOVEL CORONAVIRUS, NAA (HOSP ORDER, SEND-OUT TO REF LAB; TAT 18-24 HRS): SARS-CoV-2, NAA: NOT DETECTED

## 2018-08-19 SURGERY — COLONOSCOPY WITH PROPOFOL
Anesthesia: General

## 2018-08-19 MED ORDER — PROPOFOL 500 MG/50ML IV EMUL
INTRAVENOUS | Status: DC | PRN
  Administered 2018-08-19: 150 ug/kg/min via INTRAVENOUS

## 2018-08-19 MED ORDER — SODIUM CHLORIDE 0.9 % IV SOLN
INTRAVENOUS | Status: DC
  Administered 2018-08-19: 14:00:00 via INTRAVENOUS

## 2018-08-19 MED ORDER — PHENYLEPHRINE HCL (PRESSORS) 10 MG/ML IV SOLN
INTRAVENOUS | Status: DC | PRN
  Administered 2018-08-19 (×3): 100 ug via INTRAVENOUS

## 2018-08-19 MED ORDER — LIDOCAINE HCL (CARDIAC) PF 100 MG/5ML IV SOSY
PREFILLED_SYRINGE | INTRAVENOUS | Status: DC | PRN
  Administered 2018-08-19: 100 mg via INTRAVENOUS

## 2018-08-19 MED ORDER — PROPOFOL 10 MG/ML IV BOLUS
INTRAVENOUS | Status: DC | PRN
  Administered 2018-08-19: 60 mg via INTRAVENOUS

## 2018-08-19 NOTE — Anesthesia Procedure Notes (Signed)
Date/Time: 08/19/2018 3:35 PM Performed by: Nelda Marseille, CRNA Pre-anesthesia Checklist: Patient identified, Emergency Drugs available, Suction available, Patient being monitored and Timeout performed Oxygen Delivery Method: Nasal cannula

## 2018-08-19 NOTE — Op Note (Signed)
[facility] Patient Name: Diana Cook Procedure Date: 08/19/2018 3:06 PM MRN: [id number] Account #: [id number] Date of Birth: 1950/08/28 Admit Type: Outpatient Age: 68 Room: [facility] ENDO ROOM 3 Gender: Female Note Status: Finalized Procedure:            Upper GI endoscopy Indications:          Suspected esophageal reflux, Abnormal CT of the GI tract Providers:            Dekisha Mesmer B. Bonna Gains MD, MD Medicines:            Monitored Anesthesia Care Complications:        No immediate complications. Procedure:            Pre-Anesthesia Assessment:                       - Prior to the procedure, a History and Physical was                        performed, and patient medications, allergies and                        sensitivities were reviewed. The patient's tolerance of                        previous anesthesia was reviewed.                       - The risks and benefits of the procedure and the                        sedation options and risks were discussed with the                        patient. All questions were answered and informed                        consent was obtained.                       - Patient identification and proposed procedure were                        verified prior to the procedure by the physician, the                        nurse, the anesthesiologist, the anesthetist and the                        technician. The procedure was verified in the procedure                        room.                       - ASA Grade Assessment: II - A patient with mild                        systemic disease.                       After obtaining informed consent, the endoscope was  passed under direct vision. Throughout the procedure,                        the patient's blood pressure, pulse, and oxygen                        saturations were monitored continuously. The Endoscope                        was  introduced through the mouth, and advanced to the                        second part of duodenum. The upper GI endoscopy was                        accomplished with ease. The patient tolerated the                        procedure well. Findings:      The examined esophagus was normal.      Patchy nodular mucosa was found in the gastric body. Biopsies were       obtained in the gastric body, at the incisura and in the gastric antrum       with cold forceps for histology.      The exam of the stomach was otherwise normal.      The duodenal bulb, second portion of the duodenum and examined duodenum       were normal. Impression:           - Normal esophagus.                       - Nodular mucosa in the gastric body.                       - Normal duodenal bulb, second portion of the duodenum                        and examined duodenum.                       - Biopsies were obtained in the gastric body, at the                        incisura and in the gastric antrum. Recommendation:       - Await pathology results.                       - Discharge patient to home (with escort).                       - Advance diet as tolerated.                       - Continue present medications.                       - Patient has a contact number available for                        emergencies. The signs and symptoms of potential  delayed complications were discussed with the patient.                        Return to normal activities tomorrow. Written discharge                        instructions were provided to the patient.                       - Discharge patient to home (with escort).                       - The findings and recommendations were discussed with                        the patient.                       - The findings and recommendations were discussed with                        the patient's family. Procedure Code(s):    --- Professional ---                        [id number], Esophagogastroduodenoscopy, flexible, transoral;                        with biopsy, single or multiple Diagnosis Code(s):    --- Professional ---                       K31.89, Other diseases of stomach and duodenum                       R93.3, Abnormal findings on diagnostic imaging of other                        parts of digestive tract CPT copyright 2019 American Medical Association. All rights reserved. The codes documented in this report are preliminary and upon coder review may  be revised to meet current compliance requirements.  Vonda Antigua, MD Margretta Sidle B. Bonna Gains MD, MD 08/19/2018 3:30:43 PM This report has been signed electronically. Number of Addenda: 0 Note Initiated On: 08/19/2018 3:06 PM Estimated Blood Loss: Estimated blood loss: none.      [facility]

## 2018-08-19 NOTE — Op Note (Signed)
[facility] Patient Name: Diana Cook Procedure Date: 08/19/2018 3:06 PM MRN: [id number] Account #: [id number] Date of Birth: 1951/02/12 Admit Type: Outpatient Age: 68 Room: [facility] ENDO ROOM 3 Gender: Female Note Status: Finalized Procedure:            Colonoscopy Indications:          High risk colon cancer surveillance: Personal history                        of colonic polyps Providers:            Adelena Desantiago B. Bonna Gains MD, MD Medicines:            Monitored Anesthesia Care Complications:        No immediate complications. Procedure:            Pre-Anesthesia Assessment:                       - ASA Grade Assessment: II - A patient with mild                        systemic disease.                       - Prior to the procedure, a History and Physical was                        performed, and patient medications, allergies and                        sensitivities were reviewed. The patient's tolerance of                        previous anesthesia was reviewed.                       - The risks and benefits of the procedure and the                        sedation options and risks were discussed with the                        patient. All questions were answered and informed                        consent was obtained.                       - Patient identification and proposed procedure were                        verified prior to the procedure by the physician, the                        nurse, the anesthesiologist, the anesthetist and the                        technician. The procedure was verified in the procedure                        room.  After obtaining informed consent, the colonoscope was                        passed under direct vision. Throughout the procedure,                        the patient's blood pressure, pulse, and oxygen                        saturations were monitored continuously. The             Colonoscope was introduced through the anus and                        advanced to the the cecum, identified by appendiceal                        orifice and ileocecal valve. The colonoscopy was                        performed with ease. The patient tolerated the                        procedure well. The quality of the bowel preparation                        was fair except the sigmoid colon was good and the                        descending colon was good. Findings:      The perianal and digital rectal examinations were normal.      A 7 mm polyp was found in the transverse colon. The polyp was flat. The       polyp was removed with a cold snare. Resection and retrieval were       complete.      A 10 mm polyp was found in the descending colon. The polyp was flat.       Area was successfully injected with Eleview for lesion assessment, and       this injection appeared to lift the lesion adequately. Polypectomy was       attempted, initially using a hot snare. Polyp resection was incomplete       with this device. This intervention then required a different device and       polypectomy technique. The polyp was removed with a cold snare.       Resection and retrieval were complete.      A 7 mm polyp was found in the sigmoid colon. The polyp was sessile. The       polyp was removed with a hot snare. Resection and retrieval were       complete.      Three sessile polyps were found in the sigmoid colon. The polyps were 4       to 5 mm in size. These polyps were removed with a cold snare. Resection       and retrieval were complete.      The exam was otherwise without abnormality.      The rectum, sigmoid colon, descending colon, transverse colon, ascending       colon and cecum appeared normal.      The retroflexed  view of the distal rectum and anal verge was normal and       showed no anal or rectal abnormalities. Impression:           - One 7 mm polyp in the transverse colon,  removed with                        a cold snare. Resected and retrieved.                       - One 10 mm polyp in the descending colon, removed with                        a cold snare. Resected and retrieved. Injected.                       - One 7 mm polyp in the sigmoid colon, removed with a                        hot snare. Resected and retrieved.                       - Three 4 to 5 mm polyps in the sigmoid colon, removed                        with a cold snare. Resected and retrieved.                       - The examination was otherwise normal.                       - The rectum, sigmoid colon, descending colon,                        transverse colon, ascending colon and cecum are normal.                       - The distal rectum and anal verge are normal on                        retroflexion view. Recommendation:       - Discharge patient to home (with escort).                       - Advance diet as tolerated.                       - Continue present medications.                       - Await pathology results.                       - Repeat colonoscopy date to be determined after                        pending pathology results are reviewed.                       - The findings and recommendations were discussed with  the patient.                       - The findings and recommendations were discussed with                        the patient's family.                       - Return to primary care physician as previously                        scheduled. Procedure Code(s):    --- Professional ---                       [phone], Colonoscopy, flexible; with removal of tumor(s),                        polyp(s), or other lesion(s) by snare technique                       [id number], Colonoscopy, flexible; with directed submucosal                        injection(s), any substance Diagnosis Code(s):    --- Professional ---                       K63.5, Polyp of  colon                       Z86.010, Personal history of colonic polyps CPT copyright 2019 American Medical Association. All rights reserved. The codes documented in this report are preliminary and upon coder review may  be revised to meet current compliance requirements.  Vonda Antigua, MD Margretta Sidle B. Bonna Gains MD, MD 08/19/2018 4:31:48 PM This report has been signed electronically. Number of Addenda: 0 Note Initiated On: 08/19/2018 3:06 PM Scope Withdrawal Time: 0 hours 37 minutes 42 seconds  Total Procedure Duration: 0 hours 48 minutes 26 seconds  Estimated Blood Loss: Estimated blood loss: none.      [facility]

## 2018-08-19 NOTE — H&P (Signed)
Vonda Antigua, MD [address], [place], [place], [place], [address] [address], [place], [place], [place], [address] Phone: [phone]  Fax: [phone]  Primary Care Physician:  Tower, Wynelle Fanny, MD   Pre-Procedure History & Physical: HPI:  Diana Cook is a 67 y.o. female is here for a colonoscopy and EGD.   Past Medical History:  Diagnosis Date  . Allergic rhinitis   . Arthritis   . Asthma    as a child, mild now  . Breast cancer ([place]) 12/2015   right breast cancer, lumpectomy and mammosite   . Colon polyps    colonoscopy 7/08, tubular adenoma  . Complete heart block ([place]) 8/13   s/p PPM implanted in [facility]  . COPD (chronic obstructive pulmonary disease) ([place])   . Myocardial infarction ([place]) 2011  . Osteopenia 10/2015  . Pacemaker    2011  . Personal history of radiation therapy 2017   right breast ca, mammosite placed  . Seizure disorder ([place])   . Seizures ([place])    first one was when she was 68 years old   . Small bowel obstruction ([place])    1988 and 2002  . Tobacco abuse     Past Surgical History:  Procedure Laterality Date  . ABDOMINAL HYSTERECTOMY    . BREAST BIOPSY Right 11/2005   benign inflammatory changes, mass due to underwire bra  . BREAST BIOPSY Left 01/02/2016   columnar cell changes without atypical hyperplasia.  Marland Kitchen BREAST EXCISIONAL BIOPSY Left 01/02/2016   COLUMNAR CELL CHANGE AND HYPERPLASIA ASSOCIATED WITH LUMINAL AND STROMAL CALCIFICATIONS  . BREAST LUMPECTOMY WITH SENTINEL LYMPH NODE BIOPSY Right 01/02/2016   pT1c, N0; ER/ PR 100%; Her 2 neu not over expressed: BREAST LUMPECTOMY WITH SENTINEL LYMPH NODE BX;  Surgeon: [name], MD;  Location: [facility] ORS;  Service: General;  Laterality: Right;  . CATARACT EXTRACTION Bilateral   . COLONOSCOPY  10/2015   Dr Ardis Hughs  . EXPLORATORY LAPAROTOMY  01/25/2001   Exploratory laparotomy, lysis of adhesions, identification of internal hernia secondary to omental adhesion. Prolonged  postoperative ileus.  Marland Kitchen gyn surgery  1993   hysterectomy- form endometriosis  . LAPAROSCOPY    . PACEMAKER INSERTION  10/24/11   [employer] Advantio dual chamber PPM implanted by Dr Bunnie Philips at [facility] in [place]    Prior to Admission medications   Medication Sig Start Date End Date Taking? Authorizing Provider  acetaminophen (TYLENOL) 500 MG tablet Take 500 mg by mouth every 6 (six) hours as needed.   Yes [provider]  Aclidinium Br-Formoterol Fum (DUAKLIR PRESSAIR) 400-12 MCG/ACT AEPB Inhale 1 puff into the lungs daily. 01/05/18  Yes Tyler Pita, MD  albuterol (PROVENTIL HFA;VENTOLIN HFA) 108 (90 Base) MCG/ACT inhaler Inhale 2 puffs into the lungs every 4 (four) hours as needed for wheezing. 12/15/17  Yes Tower, Wynelle Fanny, MD  Calcium-Vitamin D-Vitamin K 578-469-62 MG-UNT-MCG TABS Take 1 tablet by mouth 2 (two) times daily.    Yes [provider]  FLUoxetine (PROZAC) 20 MG capsule Take 1 capsule (20 mg total) by mouth daily. 12/15/17  Yes Tower, Wynelle Fanny, MD  furosemide (LASIX) 20 MG tablet TAKE 1 TABLET BY MOUTH ONCE DAILY AS NEEDED 09/08/17  Yes Deboraha Sprang, MD  lamoTRIgine (LAMICTAL) 100 MG tablet Take 1 tablet by mouth 2 (two) times daily. 11/03/17  Yes [provider]  tretinoin (RETIN-A) 0.1 % cream Apply topically at bedtime. 01/26/18  Yes Tower,  Marne A, MD  zolpidem (AMBIEN) 10 MG tablet TAKE 1/2 TO 1 (ONE-HALF TO ONE) TABLET BY MOUTH AT BEDTIME AS NEEDED FOR SLEEP 04/06/18  Yes Tower, Wynelle Fanny, MD    Allergies as of 07/30/2018 - Review Complete 07/29/2018  Allergen Reaction Noted  . Codeine Nausea And Vomiting 06/14/2007  . Morphine and related Nausea Only 11/03/2011    Family History  Problem Relation Age of Onset  . Stroke Mother   . Heart disease Mother        MI  . Dementia Mother   . Coronary artery disease Father   . Parkinsonism Father   . Cancer - Cervical Daughter 61       died 04/04/22  . Colon  cancer Neg Hx   . Breast cancer Neg Hx     Social History   Socioeconomic History  . Marital status: Married    Spouse name: Not on file  . Number of children: Not on file  . Years of education: Not on file  . Highest education level: Not on file  Occupational History    Employer: RETIRED  Social Needs  . Financial resource strain: Not on file  . Food insecurity    Worry: Not on file    Inability: Not on file  . Transportation needs    Medical: Not on file    Non-medical: Not on file  Tobacco Use  . Smoking status: Former Smoker    Packs/day: 1.00    Years: 42.00    Pack years: 42.00    Types: Cigarettes    Quit date: 11/11/2010    Years since quitting: 7.7  . Smokeless tobacco: Never Used  Substance and Sexual Activity  . Alcohol use: Yes    Alcohol/week: 4.0 standard drinks    Types: 4 Glasses of wine per week    Comment: wine (pt said it's very occ)  . Drug use: No  . Sexual activity: Never  Lifestyle  . Physical activity    Days per week: Not on file    Minutes per session: Not on file  . Stress: Not on file  Relationships  . Social Herbalist on phone: Not on file    Gets together: Not on file    Attends religious service: Not on file    Active member of club or organization: Not on file    Attends meetings of clubs or organizations: Not on file    Relationship status: Not on file  . Intimate partner violence    Fear of current or ex partner: Not on file    Emotionally abused: Not on file    Physically abused: Not on file    Forced sexual activity: Not on file  Other Topics Concern  . Not on file  Social History Narrative   Married 40 years.   Exercises, does walking tapes    Review of Systems: See HPI, otherwise negative ROS  Physical Exam: There were no vitals taken for this visit. General:   Alert,  pleasant and cooperative in NAD Head:  Normocephalic and atraumatic. Neck:  Supple; no masses or thyromegaly. Lungs:  Clear  throughout to auscultation, normal respiratory effort.    Heart:  +S1, +S2, Regular rate and rhythm, No edema. Abdomen:  Soft, nontender and nondistended. Normal bowel sounds, without guarding, and without rebound.   Neurologic:  Alert and  oriented x4;  grossly normal neurologically.  Impression/Plan: Diana Cook is here for a colonoscopy to  be performed for polyp surveillance and EGD for Acid Reflux and abnormal CT.  Risks, benefits, limitations, and alternatives regarding the procedures have been reviewed with the patient.  Questions have been answered.  All parties agreeable.   Virgel Manifold, MD  08/19/2018, 1:54 PM

## 2018-08-19 NOTE — Anesthesia Postprocedure Evaluation (Signed)
Anesthesia Post Note  Patient: Diana Cook  Procedure(s) Performed: COLONOSCOPY WITH PROPOFOL (N/A ) ESOPHAGOGASTRODUODENOSCOPY (EGD) WITH PROPOFOL (N/A )  Patient location during evaluation: PACU Anesthesia Type: General Level of consciousness: awake and alert and oriented Pain management: pain level controlled Vital Signs Assessment: post-procedure vital signs reviewed and stable Respiratory status: spontaneous breathing Cardiovascular status: blood pressure returned to baseline Anesthetic complications: no     Last Vitals:  Vitals:   08/19/18 1646 08/19/18 1656  BP: 131/69 (!) 147/72  Pulse:    Resp:    Temp:    SpO2:      Last Pain:  Vitals:   08/19/18 1656  TempSrc:   PainSc: 0-No pain                 Starlin Steib

## 2018-08-19 NOTE — Anesthesia Post-op Follow-up Note (Signed)
Anesthesia QCDR form completed.        

## 2018-08-19 NOTE — Transfer of Care (Signed)
Immediate Anesthesia Transfer of Care Note  Patient: Diana Cook  Procedure(s) Performed: COLONOSCOPY WITH PROPOFOL (N/A ) ESOPHAGOGASTRODUODENOSCOPY (EGD) WITH PROPOFOL (N/A )  Patient Location: PACU  Anesthesia Type:General  Level of Consciousness: sedated  Airway & Oxygen Therapy: Patient Spontanous Breathing and Patient connected to nasal cannula oxygen  Post-op Assessment: Report given to RN and Post -op Vital signs reviewed and stable  Post vital signs: Reviewed and stable  Last Vitals:  Vitals Value Taken Time  BP    Temp    Pulse 67 08/19/18 1627  Resp 19 08/19/18 1627  SpO2 98 % 08/19/18 1627  Vitals shown include unvalidated device data.  Last Pain:  Vitals:   08/19/18 1436  TempSrc: Oral  PainSc: 0-No pain         Complications: No apparent anesthesia complications

## 2018-08-19 NOTE — Anesthesia Preprocedure Evaluation (Signed)
Anesthesia Evaluation  Patient identified by MRN, date of birth, ID band Patient awake    Reviewed: Allergy & Precautions, NPO status , Patient's Chart, lab work & pertinent test results, reviewed documented beta blocker date and time   Airway Mallampati: II  TM Distance: >3 FB     Dental  (+) Chipped   Pulmonary asthma , COPD, former smoker,           Cardiovascular + Past MI  + dysrhythmias + pacemaker      Neuro/Psych Seizures -,     GI/Hepatic   Endo/Other    Renal/GU      Musculoskeletal  (+) Arthritis ,   Abdominal   Peds  Hematology   Anesthesia Other Findings Smokes. EKG shows paced rhythm. ECHO 50-55.  Reproductive/Obstetrics                             Anesthesia Physical Anesthesia Plan  ASA: III  Anesthesia Plan: General   Post-op Pain Management:    Induction: Intravenous  PONV Risk Score and Plan:   Airway Management Planned:   Additional Equipment:   Intra-op Plan:   Post-operative Plan:   Informed Consent: I have reviewed the patients History and Physical, chart, labs and discussed the procedure including the risks, benefits and alternatives for the proposed anesthesia with the patient or authorized representative who has indicated his/her understanding and acceptance.       Plan Discussed with: CRNA  Anesthesia Plan Comments:         Anesthesia Quick Evaluation

## 2018-08-20 ENCOUNTER — Encounter: Payer: Self-pay | Admitting: Gastroenterology

## 2018-08-23 ENCOUNTER — Encounter: Payer: Self-pay | Admitting: Pulmonary Disease

## 2018-08-23 LAB — SURGICAL PATHOLOGY

## 2018-08-31 ENCOUNTER — Other Ambulatory Visit: Payer: Self-pay | Admitting: Gastroenterology

## 2018-08-31 DIAGNOSIS — G4734 Idiopathic sleep related nonobstructive alveolar hypoventilation: Secondary | ICD-10-CM | POA: Diagnosis not present

## 2018-08-31 MED ORDER — CLARITHROMYCIN 250 MG PO TABS: 500.0000 mg | ORAL_TABLET | Freq: Two times a day (BID) | ORAL | 0 refills | Status: AC

## 2018-08-31 MED ORDER — AMOXICILLIN 500 MG PO TABS: 1000.0000 mg | ORAL_TABLET | Freq: Two times a day (BID) | ORAL | 0 refills | Status: AC

## 2018-08-31 MED ORDER — OMEPRAZOLE 20 MG PO CPDR: 20.0000 mg | DELAYED_RELEASE_CAPSULE | Freq: Two times a day (BID) | ORAL | 0 refills | Status: DC

## 2018-09-01 ENCOUNTER — Telehealth: Payer: Self-pay

## 2018-09-01 NOTE — Telephone Encounter (Signed)
-----   Message from Virgel Manifold, MD sent at 08/31/2018 12:48 PM EDT ----- Jackelyn Poling please let patient know, her stomach biopsies showed H pylori. This needs to be treated with antibiotics and omeprazole. I sent this to pharmacy for 14 days. Colonoscopy showed precancerous polyps that were removed. Repeat recommended in 3 years.  Follow up in clinic in 2-3 months for H pylori eradication testing

## 2018-09-01 NOTE — Telephone Encounter (Signed)
LVM for pt to return my call regarding procedure results.

## 2018-09-02 ENCOUNTER — Telehealth: Payer: Self-pay

## 2018-09-02 DIAGNOSIS — S62112A Displaced fracture of triquetrum [cuneiform] bone, left wrist, initial encounter for closed fracture: Secondary | ICD-10-CM | POA: Diagnosis not present

## 2018-09-02 DIAGNOSIS — J449 Chronic obstructive pulmonary disease, unspecified: Secondary | ICD-10-CM | POA: Diagnosis not present

## 2018-09-02 DIAGNOSIS — S6992XA Unspecified injury of left wrist, hand and finger(s), initial encounter: Secondary | ICD-10-CM | POA: Diagnosis not present

## 2018-09-02 DIAGNOSIS — Z87891 Personal history of nicotine dependence: Secondary | ICD-10-CM | POA: Diagnosis not present

## 2018-09-02 NOTE — Telephone Encounter (Signed)
Called and spoke to patient. She is aware to continue nighttime oxygen at 2LPM. Nothing furhter needed at this time.

## 2018-09-02 NOTE — Telephone Encounter (Signed)
Pt notified of lab results and rx's that have been sent to her pharmacy.

## 2018-09-06 DIAGNOSIS — Y929 Unspecified place or not applicable: Secondary | ICD-10-CM | POA: Diagnosis not present

## 2018-09-06 DIAGNOSIS — S62112A Displaced fracture of triquetrum [cuneiform] bone, left wrist, initial encounter for closed fracture: Secondary | ICD-10-CM | POA: Diagnosis not present

## 2018-09-06 DIAGNOSIS — W010XXA Fall on same level from slipping, tripping and stumbling without subsequent striking against object, initial encounter: Secondary | ICD-10-CM | POA: Diagnosis not present

## 2018-09-08 ENCOUNTER — Encounter: Payer: Medicare Other | Admitting: *Deleted

## 2018-09-09 ENCOUNTER — Telehealth: Payer: Self-pay

## 2018-09-09 NOTE — Telephone Encounter (Signed)
Left message for patient to remind of missed remote transmission.  

## 2018-09-14 ENCOUNTER — Encounter: Payer: Self-pay | Admitting: General Surgery

## 2018-09-17 ENCOUNTER — Encounter: Payer: Self-pay | Admitting: Pulmonary Disease

## 2018-09-17 DIAGNOSIS — S62115D Nondisplaced fracture of triquetrum [cuneiform] bone, left wrist, subsequent encounter for fracture with routine healing: Secondary | ICD-10-CM | POA: Diagnosis not present

## 2018-09-21 DIAGNOSIS — M5136 Other intervertebral disc degeneration, lumbar region: Secondary | ICD-10-CM | POA: Diagnosis not present

## 2018-09-21 DIAGNOSIS — M5416 Radiculopathy, lumbar region: Secondary | ICD-10-CM | POA: Diagnosis not present

## 2018-09-22 ENCOUNTER — Ambulatory Visit (INDEPENDENT_AMBULATORY_CARE_PROVIDER_SITE_OTHER): Payer: Medicare Other | Admitting: *Deleted

## 2018-09-22 DIAGNOSIS — I442 Atrioventricular block, complete: Secondary | ICD-10-CM

## 2018-09-22 LAB — CUP PACEART REMOTE DEVICE CHECK
Battery Remaining Longevity: 12 mo
Battery Remaining Percentage: 24 %
Brady Statistic RA Percent Paced: 7 %
Brady Statistic RV Percent Paced: 100 %
Date Time Interrogation Session: 20200729080800
Implantable Lead Implant Date: 20130830
Implantable Lead Implant Date: 20130830
Implantable Lead Location: 753859
Implantable Lead Location: 753860
Implantable Lead Model: 4456
Implantable Lead Model: 4479
Implantable Lead Serial Number: 473325
Implantable Lead Serial Number: 523784
Implantable Pulse Generator Implant Date: 20130830
Lead Channel Impedance Value: 415 Ohm
Lead Channel Impedance Value: 615 Ohm
Lead Channel Pacing Threshold Amplitude: 0.5 V
Lead Channel Pacing Threshold Pulse Width: 0.5 ms
Lead Channel Setting Pacing Amplitude: 2 V
Lead Channel Setting Pacing Amplitude: 2.4 V
Lead Channel Setting Pacing Pulse Width: 0.5 ms
Lead Channel Setting Sensing Sensitivity: 2.5 mV
Pulse Gen Serial Number: 134132

## 2018-09-23 ENCOUNTER — Telehealth: Payer: Self-pay | Admitting: Gastroenterology

## 2018-09-23 NOTE — Telephone Encounter (Signed)
Pt lef vm she had a procedure with Dr. Bonna Gains and was prescribed antibiotics for 2 weeks  that are up setting her stomach she would like to know if there is something she can take with it? Also would like some explanation as to why she is taking the rx and what Dr. Bonna Gains found during procedure

## 2018-09-23 NOTE — Telephone Encounter (Signed)
Went back over the pathology results with pt again and explained why she was taking the antibiotics. Pt stated she was having some pain in her stomach since taking these. Advised pt to continue these antibiotics but if she starts to develop more severe symptoms, she is to stop the medications and contact our office.

## 2018-09-24 ENCOUNTER — Encounter: Payer: Self-pay | Admitting: Gastroenterology

## 2018-09-29 ENCOUNTER — Other Ambulatory Visit: Payer: Self-pay | Admitting: Family Medicine

## 2018-09-29 NOTE — Telephone Encounter (Signed)
Last refilled on 04/06/2018 for #30 with 5 refills. LOV 06/02/2018 with Dr Lorelei Pont. Future appointment on 01/04/2019 for CPE

## 2018-09-30 NOTE — Progress Notes (Signed)
Remote pacemaker transmission.   

## 2018-10-04 ENCOUNTER — Telehealth: Payer: Self-pay | Admitting: Gastroenterology

## 2018-10-04 NOTE — Telephone Encounter (Signed)
Patient called & states she has completed her antibiotic's & had procedure on 08-19-18. What is her next step? She has an appointment on 10-28-18 with Dr Bonna Gains. Please advise?

## 2018-10-19 ENCOUNTER — Other Ambulatory Visit: Payer: Self-pay

## 2018-10-19 DIAGNOSIS — C50411 Malignant neoplasm of upper-outer quadrant of right female breast: Secondary | ICD-10-CM

## 2018-10-23 ENCOUNTER — Other Ambulatory Visit: Payer: Self-pay | Admitting: Internal Medicine

## 2018-10-28 ENCOUNTER — Other Ambulatory Visit: Payer: Self-pay

## 2018-10-28 ENCOUNTER — Ambulatory Visit (INDEPENDENT_AMBULATORY_CARE_PROVIDER_SITE_OTHER): Payer: Medicare Other | Admitting: Gastroenterology

## 2018-10-28 ENCOUNTER — Encounter: Payer: Self-pay | Admitting: Gastroenterology

## 2018-10-28 VITALS — BP 112/63 | HR 71 | Temp 98.2°F | Wt 184.2 lb

## 2018-10-28 DIAGNOSIS — K219 Gastro-esophageal reflux disease without esophagitis: Secondary | ICD-10-CM

## 2018-10-28 DIAGNOSIS — Z8619 Personal history of other infectious and parasitic diseases: Secondary | ICD-10-CM

## 2018-10-28 MED ORDER — FAMOTIDINE 20 MG PO TABS: 20.0000 mg | ORAL_TABLET | Freq: Every day | ORAL | 1 refills | Status: DC

## 2018-10-30 LAB — H. PYLORI BREATH TEST: H pylori Breath Test: POSITIVE — AB

## 2018-10-31 NOTE — Progress Notes (Signed)
Diana Antigua, MD [address]  [place]  [place], [place] [address]  Main: [phone]  Fax: [phone]   Primary Care Physician: Tower, Wynelle Fanny, MD   Chief Complaint  Patient presents with  . Colonoscopy    Patient is following up from procedures on 08/19/18  . EGD    HPI: Diana Cook is a 68 y.o. female previously seen for reflux and intermittent coughing spells.  She reports resolution in the symptoms. The patient denies abdominal or flank pain, anorexia, nausea or vomiting, dysphagia, change in bowel habits or black or bloody stools or weight loss.  Underwent EGD and colonoscopy in June 2020 Colonoscopy with 6 polyps removed, one being 10 mm in size.  Pathology showed sessile serrated polyps, repeat recommended in 3 years  EGD with antral mucosa in the gastric body.  Otherwise normal.  Pathology showed H. pylori and this was treated with triple therapy.  Her pulmonologist told her of an abnormality of the esophagus on imaging and referred her to [place].  They are referring to the CT chest that she had in September 2019 reporting "esophageal air-fluid level suggests this possibility or gastroesophageal reflux."  Last colonoscopy, Dr. Olena Heckle, and September 2017 for polyp surveillance.  4 polyps removed, 4 to 10 mm in size.  Pathology showed sessile serrated polyp.  Repeat colonoscopy recommended in 3 years.  Current Outpatient Medications  Medication Sig Dispense Refill  . acetaminophen (TYLENOL) 500 MG tablet Take 500 mg by mouth every 6 (six) hours as needed.    Marland Kitchen albuterol (PROVENTIL HFA;VENTOLIN HFA) 108 (90 Base) MCG/ACT inhaler Inhale 2 puffs into the lungs every 4 (four) hours as needed for wheezing. 1 Inhaler 5  . Calcium-Vitamin D-Vitamin K 024-097-35 MG-UNT-MCG TABS Take 1 tablet by mouth 2 (two) times daily.     Marland Kitchen FLUoxetine (PROZAC) 20 MG capsule Take 1 capsule (20 mg total) by mouth daily. 30 capsule 11  . furosemide (LASIX) 20 MG tablet TAKE 1  TABLET BY MOUTH ONCE DAILY AS NEEDED 90 tablet 0  . lamoTRIgine (LAMICTAL) 100 MG tablet Take 1 tablet by mouth 2 (two) times daily.    Marland Kitchen tretinoin (RETIN-A) 0.1 % cream Apply topically at bedtime. 45 g 1  . zolpidem (AMBIEN) 10 MG tablet TAKE 1/2 TO 1 (ONE-HALF TO ONE) TABLET BY MOUTH AT BEDTIME AS NEEDED FOR SLEEP 30 tablet 5  . famotidine (PEPCID) 20 MG tablet Take 1 tablet (20 mg total) by mouth daily. 30 tablet 1   No current facility-administered medications for this visit.     Allergies as of 10/28/2018 - Review Complete 10/28/2018  Allergen Reaction Noted  . Codeine Nausea And Vomiting 06/14/2007  . Morphine and related Nausea Only 11/03/2011    ROS:  General: Negative for anorexia, weight loss, fever, chills, fatigue, weakness. ENT: Negative for hoarseness, difficulty swallowing , nasal congestion. CV: Negative for chest pain, angina, palpitations, dyspnea on exertion, peripheral edema.  Respiratory: Negative for dyspnea at rest, dyspnea on exertion, cough, sputum, wheezing.  GI: See history of present illness. GU:  Negative for dysuria, hematuria, urinary incontinence, urinary frequency, nocturnal urination.  Endo: Negative for unusual weight change.    Physical Examination:   BP 112/63 (BP Location: Left Arm, Patient Position: Sitting, Cuff Size: Normal)   Pulse 71   Temp 98.2 F (36.8 C) (Oral)   Wt 184 lb 4 oz (83.6 kg)   BMI 28.02 kg/m   General: Well-nourished, well-developed in no acute distress.  Eyes: No icterus. Conjunctivae pink. Mouth: Oropharyngeal mucosa moist and pink , no lesions erythema or exudate. Neck: Supple, Trachea midline Abdomen: Bowel sounds are normal, nontender, nondistended, no hepatosplenomegaly or masses, no abdominal bruits or hernia , no rebound or guarding.   Extremities: No lower extremity edema. No clubbing or deformities. Neuro: Alert and oriented x 3.  Grossly intact. Skin: Warm and dry, no jaundice.   Psych: Alert and  cooperative, normal mood and affect.   Labs: CMP     Component Value Date/Time   NA 134 (L) 12/08/2017 1231   NA 136 07/21/2013 1517   K 4.5 12/08/2017 1231   K 4.2 07/21/2013 1517   CL 100 12/08/2017 1231   CL 105 07/21/2013 1517   CO2 28 12/08/2017 1231   CO2 22 07/21/2013 1517   GLUCOSE 98 12/08/2017 1231   GLUCOSE 131 (H) 07/21/2013 1517   BUN 28 (H) 12/08/2017 1231   BUN 19 (H) 07/21/2013 1517   CREATININE 0.95 12/08/2017 1231   CREATININE 0.94 07/21/2013 1517   CALCIUM 10.5 12/08/2017 1231   CALCIUM 9.4 07/21/2013 1517   PROT 7.8 12/08/2017 1231   PROT 7.7 07/21/2013 1517   ALBUMIN 4.5 12/08/2017 1231   ALBUMIN 3.8 07/21/2013 1517   AST 17 12/08/2017 1231   AST 31 07/21/2013 1517   ALT 14 12/08/2017 1231   ALT 17 07/21/2013 1517   ALKPHOS 78 12/08/2017 1231   ALKPHOS 105 07/21/2013 1517   BILITOT 0.6 12/08/2017 1231   BILITOT 0.6 07/21/2013 1517   GFRNONAA >60 10/01/2016 0117   GFRNONAA >60 07/21/2013 1517   GFRAA >60 10/01/2016 0117   GFRAA >60 07/21/2013 1517   Lab Results  Component Value Date   WBC 6.7 12/08/2017   HGB 13.9 12/08/2017   HCT 41.7 12/08/2017   MCV 88.9 12/08/2017   PLT 314.0 12/08/2017    Imaging Studies: No results found.  Assessment and Plan:   Diana Cook is a 68 y.o. y/o female here for follow-up of GERD and H. Pylori  We will order H. pylori eradication testing  Due to some heartburn symptoms, patient willing to try Pepcid instead of PPI  Patient educated extensively on acid reflux lifestyle modification, including buying a bed wedge, not eating 3 hrs before bedtime, diet modifications, and handout given for the same.     Dr Diana Cook

## 2018-11-02 ENCOUNTER — Telehealth: Payer: Self-pay

## 2018-11-02 NOTE — Telephone Encounter (Signed)
Did the recall to 1 year.

## 2018-11-02 NOTE — Telephone Encounter (Signed)
-----   Message from Diana Manifold, MD sent at 10/31/2018 10:07 PM EDT ----- Since one of her polyps were removed piecemeal, lets change her recall to 1 year instead of 3 years

## 2018-11-03 ENCOUNTER — Telehealth: Payer: Self-pay

## 2018-11-03 MED ORDER — OMEPRAZOLE MAGNESIUM 20 MG PO TBEC: 20.0000 mg | DELAYED_RELEASE_TABLET | Freq: Two times a day (BID) | ORAL | 0 refills | Status: DC

## 2018-11-03 MED ORDER — BISMUTH SUBSALICYLATE 262 MG PO TABS: 524.0000 mg | ORAL_TABLET | Freq: Four times a day (QID) | ORAL | 0 refills | Status: AC

## 2018-11-03 MED ORDER — METRONIDAZOLE 250 MG PO TABS: 250.0000 mg | ORAL_TABLET | Freq: Four times a day (QID) | ORAL | 0 refills | Status: AC

## 2018-11-03 MED ORDER — DOXYCYCLINE HYCLATE 100 MG PO TBEC: 100.0000 mg | DELAYED_RELEASE_TABLET | Freq: Two times a day (BID) | ORAL | 0 refills | Status: AC

## 2018-11-03 NOTE — Telephone Encounter (Signed)
Patient called back and she verbalized understanding of lab results. Informed patient that we would resend the medications to the pharmacy. Confirmed with Dr. Army Fossa which medications to send over

## 2018-11-04 DIAGNOSIS — Z23 Encounter for immunization: Secondary | ICD-10-CM | POA: Diagnosis not present

## 2018-11-08 ENCOUNTER — Telehealth: Payer: Self-pay

## 2018-11-08 DIAGNOSIS — G4733 Obstructive sleep apnea (adult) (pediatric): Secondary | ICD-10-CM | POA: Diagnosis not present

## 2018-11-08 DIAGNOSIS — R569 Unspecified convulsions: Secondary | ICD-10-CM | POA: Diagnosis not present

## 2018-11-08 NOTE — Telephone Encounter (Signed)
Patient verbalized understanding and made appointment for 10//29/20

## 2018-11-08 NOTE — Telephone Encounter (Signed)
-----   Message from Virgel Manifold, MD sent at 11/08/2018  4:34 PM EDT ----- Please set up clinic appt in 6-8 weeks as we would need to retest her for H Pylori

## 2018-11-16 ENCOUNTER — Ambulatory Visit (INDEPENDENT_AMBULATORY_CARE_PROVIDER_SITE_OTHER): Payer: Medicare Other | Admitting: Internal Medicine

## 2018-11-16 ENCOUNTER — Encounter: Payer: Self-pay | Admitting: Internal Medicine

## 2018-11-16 ENCOUNTER — Other Ambulatory Visit: Payer: Self-pay

## 2018-11-16 VITALS — BP 130/70 | HR 64 | Ht 68.0 in | Wt 185.0 lb

## 2018-11-16 DIAGNOSIS — R079 Chest pain, unspecified: Secondary | ICD-10-CM | POA: Diagnosis not present

## 2018-11-16 DIAGNOSIS — R072 Precordial pain: Secondary | ICD-10-CM | POA: Diagnosis not present

## 2018-11-16 DIAGNOSIS — Z950 Presence of cardiac pacemaker: Secondary | ICD-10-CM | POA: Diagnosis not present

## 2018-11-16 DIAGNOSIS — I442 Atrioventricular block, complete: Secondary | ICD-10-CM

## 2018-11-16 MED ORDER — METOPROLOL TARTRATE 100 MG PO TABS: ORAL_TABLET | ORAL | 0 refills | Status: DC

## 2018-11-16 NOTE — Patient Instructions (Addendum)
Medication Instructions:  - Your physician recommends that you continue on your current medications as directed. Please refer to the Current Medication list given to you today.  If you need a refill on your cardiac medications before your next appointment, please call your pharmacy.   Lab work: - pending the date of your Cardiac CTA- BMP   If you have labs (blood work) drawn today and your tests are completely normal, you will receive your results only by: Marland Kitchen MyChart Message (if you have MyChart) OR . A paper copy in the mail If you have any lab test that is abnormal or we need to change your treatment, we will call you to review the results.  Testing/Procedures: - Your physician has recommended that you have a Cardiac CTA done. You will be called to scheduled this after it has been pre-certed through your insurance.   Your cardiac CT will be scheduled at one of the below locations:   [facility] [address] [place], [place] [address] (336) [place] [address] [place], [place] [address] [phone]  If scheduled at [facility], please arrive at the [facility] main entrance of [facility] 30-45 minutes prior to test start time. Proceed to the [facility] Radiology Department (first floor) to check-in and test prep.  If scheduled at [facility], please arrive 15 mins early for check-in and test prep.  Please follow these instructions carefully (unless otherwise directed):  Hold all erectile dysfunction medications at least 3 days (72 hrs) prior to test.  On the Night Before the Test: . Be sure to Drink plenty of water. . Do not consume any caffeinated/decaffeinated beverages or chocolate 12 hours prior to your test. . Do not take any antihistamines 12 hours prior to your test.   On the Day of the Test: . Drink plenty of water. Do not drink any water  within one hour of the test. . Do not eat any food 4 hours prior to the test. . You may take your regular medications prior to the test.  . Take metoprolol (Lopressor) 100 mg x 1 dose,  two hours prior to test. . HOLD Furosemide/Hydrochlorothiazide morning of the test. . FEMALES- please wear underwire-free bra if available        After the Test: . Drink plenty of water. . After receiving IV contrast, you may experience a mild flushed feeling. This is normal. . On occasion, you may experience a mild rash up to 24 hours after the test. This is not dangerous. If this occurs, you can take Benadryl 25 mg and increase your fluid intake. . If you experience trouble breathing, this can be serious. If it is severe call 911 IMMEDIATELY. If it is mild, please call our office. . If you take any of these medications: Glipizide/Metformin, Avandament, Glucavance, please do not take 48 hours after completing test unless otherwise instructed.    Please contact the cardiac imaging nurse navigator should you have any questions/concerns Marchia Bond, RN Navigator Cardiac Imaging [facility] [phone] Office  [phone] Cell    Follow-Up: At [facility], you and your health needs are our priority.  As part of our continuing mission to provide you with exceptional heart care, we have created designated Provider Care Teams.  These Care Teams include your primary Cardiologist (physician) and Advanced Practice Providers (APPs -  Physician Assistants and Nurse Practitioners) who  all work together to provide you with the care you need, when you need it.  You will need a follow up appointment in 1 year with Dr. Caryl Comes (September 2021).   . Please call our office 2 months in advance to schedule this appointment.  (Call in early July 2021 to schedule)  Remote monitoring is used to monitor your Pacemaker of ICD from home. This monitoring reduces the number of office visits required  to check your device to one time per year. It allows [place] to keep an eye on the functioning of your device to ensure it is working properly. You are scheduled for a device check from home on 12/23/18. You may send your transmission at any time that day. If you have a wireless device, the transmission will be sent automatically. After your physician reviews your transmission, you will receive a postcard with your next transmission date.   Any Other Special Instructions Will Be Listed Below (If Applicable). - N/A

## 2018-11-16 NOTE — Progress Notes (Signed)
Patient Care Team: Tower, Wynelle Fanny, MD as PCP - General Deboraha Sprang, MD as Consulting Physician (Cardiology) Lorelee Cover., MD as Referring Physician (Ophthalmology) Tower, Wynelle Fanny, MD as Consulting Physician (Family Medicine) Bary Castilla Forest Gleason, MD (General Surgery)   HPI  [facility] is a 68 y.o. female Seen by both Drs. Allred and Lovena Le in the past who developed complete heart block and underwent pacing fall 2013 [facility] Catheterization reportedly demonstrated no coronary artery disease  Echocardiogram 9/13 demonstrated normal left ventricular ; this was repeated 9/14 with normal LV function    Some chest pains not related to exertion.  Typically lasts about a minute.  Dyspnea with exertion continues.  Intercurrently diagnosed with COPD attributed to long-term smoking.   DATE TEST EF   9/13 Echo   55-65 %   12/14 Echo   55-65 %   8/18 Echo  55-60%   7/19 Echo  50-55%       Her daughter a year ago developed cancer. Side effects from chemotherapy at left her bedbound for the last year She died Apr 12, 2022; devastating.  She meets with her grandchildren regularly for breakfast.     Date Cr K  8//18 0.83 4.4   10/19 0.95 4.5     Past Medical History:  Diagnosis Date  . Allergic rhinitis   . Arthritis   . Asthma    as a child, mild now  . Breast cancer ([place]) 12/2015   right breast cancer, lumpectomy and mammosite   . Colon polyps    colonoscopy 7/08, tubular adenoma  . Complete heart block ([place]) 8/13   s/p PPM implanted in [facility]  . COPD (chronic obstructive pulmonary disease) ([place])   . Myocardial infarction ([place]) 2011  . Osteopenia 10/2015  . Pacemaker    2011  . Personal history of radiation therapy 2017   right breast ca, mammosite placed  . Seizure disorder ([place])   . Seizures ([place])    first one was when she was 68 years old   . Small bowel obstruction ([place])    1988 and 2002  . Tobacco abuse     Past Surgical History:   Procedure Laterality Date  . ABDOMINAL HYSTERECTOMY    . BREAST BIOPSY Right 11/2005   benign inflammatory changes, mass due to underwire bra  . BREAST BIOPSY Left 01/02/2016   columnar cell changes without atypical hyperplasia.  Marland Kitchen BREAST EXCISIONAL BIOPSY Left 01/02/2016   COLUMNAR CELL CHANGE AND HYPERPLASIA ASSOCIATED WITH LUMINAL AND STROMAL CALCIFICATIONS  . BREAST LUMPECTOMY WITH SENTINEL LYMPH NODE BIOPSY Right 01/02/2016   pT1c, N0; ER/ PR 100%; Her 2 neu not over expressed: BREAST LUMPECTOMY WITH SENTINEL LYMPH NODE BX;  Surgeon: [name], MD;  Location: [facility] ORS;  Service: General;  Laterality: Right;  . CATARACT EXTRACTION Bilateral   . COLONOSCOPY  10/2015   Dr Ardis Hughs  . COLONOSCOPY WITH PROPOFOL N/A 08/19/2018   Procedure: COLONOSCOPY WITH PROPOFOL;  Surgeon: Virgel Manifold, MD;  Location: [facility] ENDOSCOPY;  Service: Endoscopy;  Laterality: N/A;  . ESOPHAGOGASTRODUODENOSCOPY (EGD) WITH PROPOFOL N/A 08/19/2018   Procedure: ESOPHAGOGASTRODUODENOSCOPY (EGD) WITH PROPOFOL;  Surgeon: Virgel Manifold, MD;  Location: [facility] ENDOSCOPY;  Service: Endoscopy;  Laterality: N/A;  . EXPLORATORY LAPAROTOMY  01/25/2001   Exploratory laparotomy, lysis of adhesions, identification of internal hernia secondary to omental adhesion. Prolonged postoperative ileus.  Marland Kitchen gyn surgery  1993   hysterectomy- form endometriosis  . LAPAROSCOPY    .  PACEMAKER INSERTION  10/24/11   [employer] Advantio dual chamber PPM implanted by Dr Bunnie Philips at [facility] in [place]    Current Outpatient Medications  Medication Sig Dispense Refill  . acetaminophen (TYLENOL) 500 MG tablet Take 500 mg by mouth every 6 (six) hours as needed.    Marland Kitchen albuterol (PROVENTIL HFA;VENTOLIN HFA) 108 (90 Base) MCG/ACT inhaler Inhale 2 puffs into the lungs every 4 (four) hours as needed for wheezing. 1 Inhaler 5  . Bismuth Subsalicylate 532 MG TABS Take 2 tablets (524 mg total) by mouth 4 (four)  times daily for 14 days. 112 tablet 0  . Calcium-Vitamin D-Vitamin K 992-426-83 MG-UNT-MCG TABS Take 1 tablet by mouth 2 (two) times daily.     Marland Kitchen doxycycline (DORYX) 100 MG EC tablet Take 1 tablet (100 mg total) by mouth 2 (two) times daily for 14 days. 28 tablet 0  . famotidine (PEPCID) 20 MG tablet Take 1 tablet (20 mg total) by mouth daily. 30 tablet 1  . FLUoxetine (PROZAC) 20 MG capsule Take 1 capsule (20 mg total) by mouth daily. 30 capsule 11  . furosemide (LASIX) 20 MG tablet TAKE 1 TABLET BY MOUTH ONCE DAILY AS NEEDED 90 tablet 0  . lamoTRIgine (LAMICTAL) 100 MG tablet Take 1 tablet by mouth 2 (two) times daily.    . metroNIDAZOLE (FLAGYL) 250 MG tablet Take 1 tablet (250 mg total) by mouth 4 (four) times daily for 14 days. 56 tablet 0  . omeprazole (PRILOSEC OTC) 20 MG tablet Take 1 tablet (20 mg total) by mouth 2 (two) times daily for 14 days. 28 tablet 0  . zolpidem (AMBIEN) 10 MG tablet TAKE 1/2 TO 1 (ONE-HALF TO ONE) TABLET BY MOUTH AT BEDTIME AS NEEDED FOR SLEEP 30 tablet 5   No current facility-administered medications for this visit.     Allergies  Allergen Reactions  . Codeine Nausea And Vomiting    REACTION: nausea  . Morphine And Related Nausea Only    Nausea     Review of Systems negative except from HPI and PMH  Physical Exam BP 130/70 (BP Location: Left Arm, Patient Position: Sitting, Cuff Size: Normal)   Pulse 64   Ht 5\' 8"  (1.727 m)   Wt 185 lb (83.9 kg)   BMI 28.13 kg/m  Well developed and well nourished in no acute distress HENT normal Neck supple with JVP-flat Clear Device pocket well healed; without hematoma or erythema.  There is no tethering  Regular rate and rhythm, no  gallop No  murmur Abd-soft with active BS No Clubbing cyanosis tr edema Skin-warm and dry A & Oriented  Grossly normal sensory and motor function  ECG P-synchronous/ AV  pacing     Assessment and  Plan  High-grade heart block  Pacemaker-[employer] The patient's  device was interrogated.  The information was reviewed. No changes were made in the programming.     Sinus node dysfunction and chronotropic incompetence   Exercise intolerance  Grief   She continues with dyspnea on exertion.  Her chest pain while atypical, raises a concern that perhaps coronary disease is contributing to her symptoms.  Will undertake CTA of her coronary arteries.  Have encouraged her to continue to walk.  She is doing a great job with her grandchildren.

## 2018-11-22 LAB — CUP PACEART INCLINIC DEVICE CHECK
Brady Statistic RA Percent Paced: 7 %
Brady Statistic RV Percent Paced: 100 %
Date Time Interrogation Session: 20200922040000
Implantable Lead Implant Date: 20130830
Implantable Lead Implant Date: 20130830
Implantable Lead Location: 753859
Implantable Lead Location: 753860
Implantable Lead Model: 4456
Implantable Lead Model: 4479
Implantable Lead Serial Number: 473325
Implantable Lead Serial Number: 523784
Implantable Pulse Generator Implant Date: 20130830
Lead Channel Impedance Value: 430 Ohm
Lead Channel Impedance Value: 602 Ohm
Lead Channel Pacing Threshold Amplitude: 0.5 V
Lead Channel Pacing Threshold Amplitude: 0.7 V
Lead Channel Pacing Threshold Pulse Width: 0.5 ms
Lead Channel Pacing Threshold Pulse Width: 0.5 ms
Lead Channel Sensing Intrinsic Amplitude: 3.8 mV
Lead Channel Setting Pacing Amplitude: 2 V
Lead Channel Setting Pacing Amplitude: 2.4 V
Lead Channel Setting Pacing Pulse Width: 0.5 ms
Lead Channel Setting Sensing Sensitivity: 2.5 mV
Pulse Gen Serial Number: 134132

## 2018-11-30 DIAGNOSIS — M5136 Other intervertebral disc degeneration, lumbar region: Secondary | ICD-10-CM | POA: Diagnosis not present

## 2018-11-30 DIAGNOSIS — M5416 Radiculopathy, lumbar region: Secondary | ICD-10-CM | POA: Diagnosis not present

## 2018-12-06 ENCOUNTER — Telehealth: Payer: Self-pay | Admitting: *Deleted

## 2018-12-06 DIAGNOSIS — R079 Chest pain, unspecified: Secondary | ICD-10-CM

## 2018-12-06 DIAGNOSIS — Z01812 Encounter for preprocedural laboratory examination: Secondary | ICD-10-CM

## 2018-12-06 NOTE — Telephone Encounter (Signed)
-----   Message from Armando Gang sent at 12/06/2018  3:06 PM EDT ----- Schedule for 12-15-18 @ [facility] - is aware you will call about labs.  ----- Message ----- From: Emily Filbert, RN Sent: 12/06/2018  11:13 AM EDT To: Armando Gang, Emily Filbert, RN  Hello friend!  Just checking to see if you know if her CTA is till in the pre-cert process. I was trying to look out for appt b/c she will need a BMP.  Thanks!

## 2018-12-07 NOTE — Telephone Encounter (Signed)
I spoke with the patient regarding the need to have a BMP done prior to her Cardiac CT on 10/21. She is aware that her BMP order is in and she may go to the [place] at [facility] - 1st desk on the right- Monday-Friday (7:30 am- 5:30 pm) anytime between now and the day prior to her test.   The patient voices understanding and is agreeable.

## 2018-12-08 ENCOUNTER — Other Ambulatory Visit
Admission: RE | Admit: 2018-12-08 | Discharge: 2018-12-08 | Disposition: A | Discharge status: Home / Self Care | Payer: Medicare Other | Source: Ambulatory Visit | Attending: Internal Medicine | Admitting: Internal Medicine

## 2018-12-08 DIAGNOSIS — R079 Chest pain, unspecified: Secondary | ICD-10-CM

## 2018-12-08 DIAGNOSIS — Z01812 Encounter for preprocedural laboratory examination: Secondary | ICD-10-CM | POA: Diagnosis not present

## 2018-12-08 LAB — BASIC METABOLIC PANEL
Anion gap: 16 — ABNORMAL HIGH (ref 5–15)
BUN: 27 mg/dL — ABNORMAL HIGH (ref 8–23)
CO2: 20 mmol/L — ABNORMAL LOW (ref 22–32)
Calcium: 9.4 mg/dL (ref 8.9–10.3)
Chloride: 102 mmol/L (ref 98–111)
Creatinine, Ser: 0.82 mg/dL (ref 0.44–1.00)
GFR calc Af Amer: >60 mL/min (ref >60)
GFR calc non Af Amer: >60 mL/min (ref >60)
Glucose, Bld: 94 mg/dL (ref 70–99)
Potassium: 3.8 mmol/L (ref 3.5–5.1)
Sodium: 138 mmol/L (ref 135–145)

## 2018-12-09 ENCOUNTER — Ambulatory Visit
Admission: RE | Admit: 2018-12-09 | Discharge: 2018-12-09 | Disposition: A | Discharge status: Home / Self Care | Payer: Medicare Other | Source: Ambulatory Visit | Attending: Surgery | Admitting: Surgery

## 2018-12-09 DIAGNOSIS — Z170 Estrogen receptor positive status [ER+]: Secondary | ICD-10-CM | POA: Insufficient documentation

## 2018-12-09 DIAGNOSIS — C50411 Malignant neoplasm of upper-outer quadrant of right female breast: Secondary | ICD-10-CM

## 2018-12-09 DIAGNOSIS — R922 Inconclusive mammogram: Secondary | ICD-10-CM | POA: Diagnosis not present

## 2018-12-13 ENCOUNTER — Ambulatory Visit: Payer: Medicare Other | Admitting: Surgery

## 2018-12-14 ENCOUNTER — Telehealth (HOSPITAL_COMMUNITY): Payer: Self-pay | Admitting: Emergency Medicine

## 2018-12-14 NOTE — Telephone Encounter (Signed)
Pt returning phone call regarding upcoming cardiac imaging study; pt verbalizes understanding of appt date/time, parking situation and where to check in, pre-test NPO status and medications ordered, and verified current allergies; name and call back number provided for further questions should they arise Belkis Norbeck RN Navigator Cardiac Imaging [facility] [phone] office [phone] cell   

## 2018-12-14 NOTE — Telephone Encounter (Signed)
Left message on voicemail with name and callback number Landri Dorsainvil RN Navigator Cardiac Imaging [facility] [phone] Office [phone] Cell  

## 2018-12-15 ENCOUNTER — Other Ambulatory Visit: Payer: Self-pay

## 2018-12-15 ENCOUNTER — Ambulatory Visit
Admission: RE | Admit: 2018-12-15 | Discharge: 2018-12-15 | Disposition: A | Discharge status: Home / Self Care | Payer: Medicare Other | Source: Ambulatory Visit | Attending: Internal Medicine | Admitting: Internal Medicine

## 2018-12-15 DIAGNOSIS — R072 Precordial pain: Secondary | ICD-10-CM | POA: Diagnosis not present

## 2018-12-15 MED ORDER — IOHEXOL 350 MG/ML SOLN
75.0000 mL | Freq: Once | INTRAVENOUS | Status: AC | PRN
  Administered 2018-12-15: 13:00:00 75 mL via INTRAVENOUS

## 2018-12-15 MED ORDER — NITROGLYCERIN 0.4 MG SL SUBL
0.8000 mg | SUBLINGUAL_TABLET | Freq: Once | SUBLINGUAL | Status: AC
  Administered 2018-12-15: 0.8 mg via SUBLINGUAL

## 2018-12-15 MED ORDER — METOPROLOL TARTRATE 5 MG/5ML IV SOLN
5.0000 mg | INTRAVENOUS | Status: DC | PRN
  Administered 2018-12-15: 5 mg via INTRAVENOUS

## 2018-12-15 NOTE — Progress Notes (Signed)
Patient tolerated CT without incident. Drank water and a soda after. Ambulated to exit steady gait.

## 2018-12-17 DIAGNOSIS — R072 Precordial pain: Secondary | ICD-10-CM | POA: Diagnosis not present

## 2018-12-21 ENCOUNTER — Telehealth: Payer: Self-pay | Admitting: Internal Medicine

## 2018-12-21 NOTE — Telephone Encounter (Signed)
New Message     Pt is calling to see if the CT results are available     Please call back

## 2018-12-21 NOTE — Telephone Encounter (Signed)
Attempted to call the patient. No answer- I left a message on her identified voice mail that her CT is back, but waiting on Dr. Caryl Comes to review and sign off.  I advised I will call her back once Dr. Caryl Comes signs off. I asked that she call back with any further questions/ concerns prior to that.

## 2018-12-22 NOTE — Telephone Encounter (Signed)
Follow Up  Patient is returning call about results. Please give patient a call back.

## 2018-12-23 ENCOUNTER — Other Ambulatory Visit: Payer: Self-pay

## 2018-12-23 ENCOUNTER — Ambulatory Visit (INDEPENDENT_AMBULATORY_CARE_PROVIDER_SITE_OTHER): Payer: Medicare Other | Admitting: Gastroenterology

## 2018-12-23 ENCOUNTER — Telehealth: Payer: Self-pay | Admitting: Internal Medicine

## 2018-12-23 ENCOUNTER — Encounter: Payer: Self-pay | Admitting: Gastroenterology

## 2018-12-23 ENCOUNTER — Ambulatory Visit (INDEPENDENT_AMBULATORY_CARE_PROVIDER_SITE_OTHER): Payer: Medicare Other | Admitting: *Deleted

## 2018-12-23 DIAGNOSIS — Z8619 Personal history of other infectious and parasitic diseases: Secondary | ICD-10-CM | POA: Diagnosis not present

## 2018-12-23 DIAGNOSIS — I442 Atrioventricular block, complete: Secondary | ICD-10-CM

## 2018-12-23 DIAGNOSIS — K219 Gastro-esophageal reflux disease without esophagitis: Secondary | ICD-10-CM | POA: Diagnosis not present

## 2018-12-23 LAB — CUP PACEART REMOTE DEVICE CHECK
Battery Remaining Longevity: 12 mo
Battery Remaining Percentage: 18 %
Brady Statistic RA Percent Paced: 8 %
Brady Statistic RV Percent Paced: 100 %
Date Time Interrogation Session: 20201029081100
Implantable Lead Implant Date: 20130830
Implantable Lead Implant Date: 20130830
Implantable Lead Location: 753859
Implantable Lead Location: 753860
Implantable Lead Model: 4456
Implantable Lead Model: 4479
Implantable Lead Serial Number: 473325
Implantable Lead Serial Number: 523784
Implantable Pulse Generator Implant Date: 20130830
Lead Channel Impedance Value: 432 Ohm
Lead Channel Impedance Value: 641 Ohm
Lead Channel Pacing Threshold Amplitude: 0.7 V
Lead Channel Pacing Threshold Pulse Width: 0.5 ms
Lead Channel Setting Pacing Amplitude: 2 V
Lead Channel Setting Pacing Amplitude: 2.4 V
Lead Channel Setting Pacing Pulse Width: 0.5 ms
Lead Channel Setting Sensing Sensitivity: 2.5 mV
Pulse Gen Serial Number: 134132

## 2018-12-23 MED ORDER — FAMOTIDINE 20 MG PO TABS: 20.0000 mg | ORAL_TABLET | Freq: Every day | ORAL | 0 refills | Status: DC

## 2018-12-23 NOTE — Telephone Encounter (Signed)
Previous encounter open for the same thing. Will close this encounter.

## 2018-12-23 NOTE — Patient Instructions (Signed)
Please do not take Pepcid or omeprazole for 2 weeks. You can restart the Pepcid after the lab appointment

## 2018-12-23 NOTE — Telephone Encounter (Signed)
Still awaiting Dr. Caryl Comes to sign off on her Cardiac CT.  Secure chat sent to MD to please sign off.

## 2018-12-23 NOTE — Telephone Encounter (Signed)
  Patient is calling to go over her CT results

## 2018-12-23 NOTE — Progress Notes (Signed)
Diana Antigua, MD [address]  [place]  [place], [place] [address]  Main: [phone]  Fax: [phone]   Primary Care Physician: Tower, Wynelle Fanny, MD   Chief Complaint  Patient presents with  . Follow-up    Patient is here to repeat h pylori test. She is having no symptoms     HPI: Diana Cook is a 68 y.o. female here for follow-up of reflux and history of H. pylori.  Taking Pepcid once daily which is controlling her symptoms.  Has breakthrough symptoms about 3 times a week.  However has not made any lifestyle modifications or diet changes to help her symptoms.  No dysphagia.  No change in bowel habits or stools.  No weight loss.  Was treated for H. pylori  twice, once with triple therapy then with quadruple therapy due to positive H. pylori test on eradication testing.  Underwent EGD and colonoscopy in June 2020 Colonoscopy with 6 polyps removed, one being 10 mm in size.  Pathology showed sessile serrated polyps, repeat recommended in 3 years  EGD with nodular mucosa in the gastric body.  Otherwise normal.  Pathology showed H. pylori and this was treated with triple therapy.  Her pulmonologist told her of an abnormality of the esophagus on imaging and referred her to [place]. They are referring to the CT chest that she had in September 2019 reporting "esophageal air-fluid level suggests this possibility or gastroesophageal reflux."  Last colonoscopy, Dr. Olena Heckle, and September 2017 for polyp surveillance. 4 polyps removed, 4 to 10 mm in size. Pathology showed sessile serrated polyp. Repeat colonoscopy recommended in 3 years.  Current Outpatient Medications  Medication Sig Dispense Refill  . albuterol (PROVENTIL HFA;VENTOLIN HFA) 108 (90 Base) MCG/ACT inhaler Inhale 2 puffs into the lungs every 4 (four) hours as needed for wheezing. 1 Inhaler 5  . Calcium-Vitamin D-Vitamin K 093-267-12 MG-UNT-MCG TABS Take 1 tablet by mouth 2 (two) times daily.     . famotidine  (PEPCID) 20 MG tablet Take 1 tablet (20 mg total) by mouth daily. 30 tablet 1  . FLUoxetine (PROZAC) 20 MG capsule Take 1 capsule (20 mg total) by mouth daily. 30 capsule 11  . furosemide (LASIX) 20 MG tablet TAKE 1 TABLET BY MOUTH ONCE DAILY AS NEEDED 90 tablet 0  . lamoTRIgine (LAMICTAL) 100 MG tablet Take 1 tablet by mouth 2 (two) times daily.    . metoprolol tartrate (LOPRESSOR) 100 MG tablet Take 1 tablet (100 mg) by mouth x 1 dose, 2 hours prior to your Cardiac CT 1 tablet 0  . zolpidem (AMBIEN) 10 MG tablet TAKE 1/2 TO 1 (ONE-HALF TO ONE) TABLET BY MOUTH AT BEDTIME AS NEEDED FOR SLEEP 30 tablet 5  . acetaminophen (TYLENOL) 500 MG tablet Take 500 mg by mouth every 6 (six) hours as needed.    . famotidine (PEPCID) 20 MG tablet Take 1 tablet (20 mg total) by mouth daily. 90 tablet 0   No current facility-administered medications for this visit.     Allergies as of 12/23/2018 - Review Complete 12/23/2018  Allergen Reaction Noted  . Codeine Nausea And Vomiting 06/14/2007  . Morphine and related Nausea Only 11/03/2011    ROS:  General: Negative for anorexia, weight loss, fever, chills, fatigue, weakness. ENT: Negative for hoarseness, difficulty swallowing , nasal congestion. CV: Negative for chest pain, angina, palpitations, dyspnea on exertion, peripheral edema.  Respiratory: Negative for dyspnea at rest, dyspnea on exertion, cough, sputum, wheezing.  GI: See history  of present illness. GU:  Negative for dysuria, hematuria, urinary incontinence, urinary frequency, nocturnal urination.  Endo: Negative for unusual weight change.    Physical Examination:   There were no vitals taken for this visit.  General: Well-nourished, well-developed in no acute distress.  Eyes: No icterus. Conjunctivae pink. Mouth: Oropharyngeal mucosa moist and pink , no lesions erythema or exudate. Neck: Supple, Trachea midline Abdomen: Bowel sounds are normal, nontender, nondistended, no hepatosplenomegaly  or masses, no abdominal bruits or hernia , no rebound or guarding.   Extremities: No lower extremity edema. No clubbing or deformities. Neuro: Alert and oriented x 3.  Grossly intact. Skin: Warm and dry, no jaundice.   Psych: Alert and cooperative, normal mood and affect.   Labs: CMP     Component Value Date/Time   NA 138 12/08/2018 0836   NA 136 07/21/2013 1517   K 3.8 12/08/2018 0836   K 4.2 07/21/2013 1517   CL 102 12/08/2018 0836   CL 105 07/21/2013 1517   CO2 20 (L) 12/08/2018 0836   CO2 22 07/21/2013 1517   GLUCOSE 94 12/08/2018 0836   GLUCOSE 131 (H) 07/21/2013 1517   BUN 27 (H) 12/08/2018 0836   BUN 19 (H) 07/21/2013 1517   CREATININE 0.82 12/08/2018 0836   CREATININE 0.94 07/21/2013 1517   CALCIUM 9.4 12/08/2018 0836   CALCIUM 9.4 07/21/2013 1517   PROT 7.8 12/08/2017 1231   PROT 7.7 07/21/2013 1517   ALBUMIN 4.5 12/08/2017 1231   ALBUMIN 3.8 07/21/2013 1517   AST 17 12/08/2017 1231   AST 31 07/21/2013 1517   ALT 14 12/08/2017 1231   ALT 17 07/21/2013 1517   ALKPHOS 78 12/08/2017 1231   ALKPHOS 105 07/21/2013 1517   BILITOT 0.6 12/08/2017 1231   BILITOT 0.6 07/21/2013 1517   GFRNONAA >60 12/08/2018 0836   GFRNONAA >60 07/21/2013 1517   GFRAA >60 12/08/2018 0836   GFRAA >60 07/21/2013 1517   Lab Results  Component Value Date   WBC 6.7 12/08/2017   HGB 13.9 12/08/2017   HCT 41.7 12/08/2017   MCV 88.9 12/08/2017   PLT 314.0 12/08/2017    Imaging Studies: Ct Coronary Morph W/cta Cor W/score W/ca W/cm &/or Wo/cm  Addendum Date: 12/16/2018   ADDENDUM REPORT: 12/16/2018 16:24 EXAM: OVER-READ INTERPRETATION  CT CHEST The following report is an over-read performed by radiologist Dr. Rebekah Chesterfield [facility] on 12/16/2018. This over-read does not include interpretation of cardiac or coronary anatomy or pathology. The coronary calcium score and cardiac CTA interpretation by the cardiologist is attached. COMPARISON:  Chest CT 01/18/2018.  FINDINGS: Aortic atherosclerosis. Pacemaker leads terminating in the right atrium and right ventricle. Small pulmonary nodules are noted throughout the visualized portions of the lungs, unchanged in size, number and distribution to prior lung cancer screening chest CT 01/18/2018. Within the visualized portions of the thorax there are no suspicious appearing pulmonary nodules or masses, there is no acute consolidative airspace disease, no pleural effusions, no pneumothorax and no lymphadenopathy. Visualized portions of the upper abdomen are unremarkable. There are no aggressive appearing lytic or blastic lesions noted in the visualized portions of the skeleton. IMPRESSION: 1. Tiny pulmonary nodules scattered throughout the lungs bilaterally, similar to prior examination. The lungs are incompletely sampled on today's study, and follow-up routine annual low-dose lung cancer screening chest CT in November 2020 is recommended for appropriate follow-up. 2.  Aortic Atherosclerosis (ICD10-I70.0). Electronically Signed   By: Vinnie Langton M.D.   On: 12/16/2018 16:24  Result Date: 12/16/2018 HISTORY: Chest pain, cardiac etiology suspected chest pain EXAM: Cardiac/Coronary CT TECHNIQUE: The patient was scanned on a [employer]. PROTOCOL: A 120 kV prospective scan was triggered in the descending thoracic aorta at 111 HU's. Axial non-contrast 3 mm slices were carried out through the heart. The data set was analyzed on a dedicated work station and scored using the [place]. Gantry rotation speed was 250 msecs and collimation was 0.6 mm. Beta blockade and 0.8 mg of sl NTG was given. The 3D data set was reconstructed in 5% intervals of 35-75% of the R-R cycle. Diastolic phases were analyzed on a dedicated work station using MPR, MIP and VRT modes. The patient received 60mL OMNIPAQUE IOHEXOL 350 MG/ML SOLN of contrast. FINDINGS: Coronary calcium score: The patient's coronary artery calcium score is 521, which  places the patient in the 93 percentile. Coronary arteries: Right dominance. There does not appear to be a left main vessel, but rather the LAD and LCX arise from a common orifice of the left coronary cusp without a shared trunk. Right Coronary Artery: Large caliber vessel, gives rise to PDA and PLB. Proximal RCA with 18 mm section of mixed calcified and noncalcified plaque. There is 50-69% stenosis, and >70% stenosis cannot be excluded. Left Anterior Descending Coronary Artery: There is a 25 mm segment of the proximal LAD that has diffused calcified and also noncalcified plaque. Maximum stenosis in this region is at least 50-69% but cannot exclude >70% stenosis in this region Gives rise to diagonal branches. Left Circumflex Artery: Nondominant vessel. No significant plaque or stenosis. Gives rise to small OM branches. Aorta: Normal size, 34 mm at the mid ascending aorta (level of the PA bifurcation) measured double oblique. Moderate scattered calcifications. No dissection. Aortic Valve: Trivial calcifications. Trileaflet. Other findings: Normal pulmonary vein drainage into the left atrium. Normal left atrial appendage without a thrombus. Normal size of the pulmonary artery. Presence of pacemaker wires in RA and RV. IMPRESSION: 1.  Moderate to severe CAD, CADRADS = 3-4. 2. Coronary calcium score of 521. This was 93rd percentile for age and sex matched control. 3. Right dominant circulation, with LAD and LCx arising from same cusp but no left main trunk. 4. Proximal CAD in RCA and LAD, concerning for severe stenosis. CT FFR will be performed and reported separately. Electronically Signed: By: Buford Dresser M.D. On: 12/16/2018 15:53   Ct Coronary Fractional Flow Reserve Data Prep  Result Date: 12/17/2018 EXAM: CT FFR ANALYSIS CLINICAL DATA:  Chest pain, cardiac etiology suspected chest pain FINDINGS: FFRct analysis was performed on the original cardiac CT angiogram dataset. Diagrammatic representation of  the FFRct analysis is provided in a separate PDF document in PACS. This dictation was created using the PDF document and an interactive 3D model of the results. 3D model is not available in the EMR/PACS. Normal FFR range is >0.80. 1. LAD: No significant stenosis. Proximal FFR = 0.92, Mid FFR = 0.90, Distal FFR = 0.82. 2. LCX: No significant stenosis. Proximal FFR = 0.97, Distal FFR = 0.96. 3. RCA: FFR not available on right circulation due to motion artifact. Resubmitted several times but unable to be analyzed. IMPRESSION: 1.  CT FFR analysis did not show any significant stenosis. Electronically Signed   By: Buford Dresser M.D.   On: 12/17/2018 14:43   Mm Diag Breast Tomo Bilateral  Result Date: 12/09/2018 CLINICAL DATA:  Patient status post right breast lumpectomy 2017. EXAM: DIGITAL DIAGNOSTIC BILATERAL MAMMOGRAM WITH CAD AND TOMO  COMPARISON:  Previous exam(s). ACR Breast Density Category c: The breast tissue is heterogeneously dense, which may obscure small masses. FINDINGS: Stable postlumpectomy changes right breast. No new masses, calcifications or nonsurgical distortion identified within either breast. Mammographic images were processed with CAD. IMPRESSION: No mammographic evidence for malignancy. Stable technique changes right breast. RECOMMENDATION: Bilateral diagnostic mammography 1 year. I have discussed the findings and recommendations with the patient. If applicable, a reminder letter will be sent to the patient regarding the next appointment. BI-RADS CATEGORY  2: Benign. Electronically Signed   By: Lovey Newcomer M.D.   On: 12/09/2018 14:21    Assessment and Plan:   Diana Cook is a 68 y.o. y/o female here for follow-up of reflux and history of H. Pylori  Patient educated extensively on acid reflux lifestyle modification, including buying a bed wedge, not eating 3 hrs before bedtime, diet modifications, and handout given for the same.   Continue Pepcid once daily and if symptoms  not get better, can increase to twice daily  Patient will stop her Pepcid today and come back in 2 weeks or more for repeat H. pylori testing to confirm eradication  Repeat colonoscopy in 1 year from June 2020 due to piecemeal polypectomy doing her procedure    Dr Diana Cook

## 2018-12-24 ENCOUNTER — Other Ambulatory Visit: Payer: Self-pay | Admitting: Family Medicine

## 2018-12-24 MED ORDER — ATORVASTATIN CALCIUM 40 MG PO TABS: 40.0000 mg | ORAL_TABLET | Freq: Every day | ORAL | 3 refills | Status: DC

## 2018-12-24 NOTE — Telephone Encounter (Signed)
Secure chat message received from Dr. Caryl Comes:   "I called her told her FFR was neg; CT was positive and she needs a statin. atorvastatin 40 mg"

## 2018-12-24 NOTE — Telephone Encounter (Signed)
I called and spoke with the patient.  She confirms she did speak with Dr. Caryl Comes yesterday about the results of her Cardiac CT. She is aware of Dr. Olin Pia recommendations to start atrovastatin 40 mg once daily and is agreeable.  She would like her RX sent to the [facility] on [address].   RX sent in.

## 2018-12-27 ENCOUNTER — Telehealth: Payer: Self-pay | Admitting: Family Medicine

## 2018-12-27 ENCOUNTER — Other Ambulatory Visit: Payer: Self-pay | Admitting: Family Medicine

## 2018-12-27 DIAGNOSIS — M810 Age-related osteoporosis without current pathological fracture: Secondary | ICD-10-CM

## 2018-12-27 DIAGNOSIS — T148XXA Other injury of unspecified body region, initial encounter: Secondary | ICD-10-CM

## 2018-12-27 DIAGNOSIS — R7309 Other abnormal glucose: Secondary | ICD-10-CM

## 2018-12-27 DIAGNOSIS — I442 Atrioventricular block, complete: Secondary | ICD-10-CM

## 2018-12-27 DIAGNOSIS — Z13220 Encounter for screening for lipoid disorders: Secondary | ICD-10-CM

## 2018-12-27 NOTE — Telephone Encounter (Signed)
-----   Message from Ellamae Sia sent at 12/21/2018  4:02 PM EDT ----- Regarding: Lab orders for Tuesday, 11-3.20 Patient is scheduled for CPX labs, please order future labs, Thanks , [name]

## 2018-12-28 ENCOUNTER — Ambulatory Visit: Payer: Medicare Other

## 2018-12-28 ENCOUNTER — Ambulatory Visit (INDEPENDENT_AMBULATORY_CARE_PROVIDER_SITE_OTHER): Payer: Medicare Other

## 2018-12-28 ENCOUNTER — Other Ambulatory Visit (INDEPENDENT_AMBULATORY_CARE_PROVIDER_SITE_OTHER): Payer: Medicare Other

## 2018-12-28 DIAGNOSIS — R7309 Other abnormal glucose: Secondary | ICD-10-CM | POA: Diagnosis not present

## 2018-12-28 DIAGNOSIS — Z853 Personal history of malignant neoplasm of breast: Secondary | ICD-10-CM | POA: Diagnosis not present

## 2018-12-28 DIAGNOSIS — M810 Age-related osteoporosis without current pathological fracture: Secondary | ICD-10-CM | POA: Diagnosis not present

## 2018-12-28 DIAGNOSIS — Z0000 Encounter for general adult medical examination without abnormal findings: Secondary | ICD-10-CM | POA: Diagnosis not present

## 2018-12-28 DIAGNOSIS — Z13220 Encounter for screening for lipoid disorders: Secondary | ICD-10-CM | POA: Diagnosis not present

## 2018-12-28 DIAGNOSIS — I442 Atrioventricular block, complete: Secondary | ICD-10-CM

## 2018-12-28 DIAGNOSIS — T148XXA Other injury of unspecified body region, initial encounter: Secondary | ICD-10-CM

## 2018-12-28 LAB — CBC WITH DIFFERENTIAL/PLATELET
Basophils Absolute: 0 10*3/uL (ref 0.0–0.1)
Basophils Relative: 0.7 % (ref 0.0–3.0)
Eosinophils Absolute: 0.2 10*3/uL (ref 0.0–0.7)
Eosinophils Relative: 4 % (ref 0.0–5.0)
HCT: 39.8 % (ref 36.0–46.0)
Hemoglobin: 13 g/dL (ref 12.0–15.0)
Lymphocytes Relative: 20.8 % (ref 12.0–46.0)
Lymphs Abs: 1.2 10*3/uL (ref 0.7–4.0)
MCHC: 32.6 g/dL (ref 30.0–36.0)
MCV: 91.2 fl (ref 78.0–100.0)
Monocytes Absolute: 0.6 10*3/uL (ref 0.1–1.0)
Monocytes Relative: 10.4 % (ref 3.0–12.0)
Neutro Abs: 3.7 10*3/uL (ref 1.4–7.7)
Neutrophils Relative %: 64.1 % (ref 43.0–77.0)
Platelets: 284 10*3/uL (ref 150.0–400.0)
RBC: 4.36 Mil/uL (ref 3.87–5.11)
RDW: 13.6 % (ref 11.5–15.5)
WBC: 5.7 10*3/uL (ref 4.0–10.5)

## 2018-12-28 LAB — COMPREHENSIVE METABOLIC PANEL
ALT: 19 U/L (ref 0–35)
AST: 21 U/L (ref 0–37)
Albumin: 4.3 g/dL (ref 3.5–5.2)
Alkaline Phosphatase: 85 U/L (ref 39–117)
BUN: 21 mg/dL (ref 6–23)
CO2: 26 mEq/L (ref 19–32)
Calcium: 10.1 mg/dL (ref 8.4–10.5)
Chloride: 103 mEq/L (ref 96–112)
Creatinine, Ser: 0.84 mg/dL (ref 0.40–1.20)
GFR: 66.95 mL/min (ref >60.00)
Glucose, Bld: 85 mg/dL (ref 70–99)
Potassium: 5.3 mEq/L — ABNORMAL HIGH (ref 3.5–5.1)
Sodium: 137 mEq/L (ref 135–145)
Total Bilirubin: 0.8 mg/dL (ref 0.2–1.2)
Total Protein: 7.4 g/dL (ref 6.0–8.3)

## 2018-12-28 LAB — VITAMIN D 25 HYDROXY (VIT D DEFICIENCY, FRACTURES): VITD: 39.35 ng/mL (ref 30.00–100.00)

## 2018-12-28 LAB — LIPID PANEL
Cholesterol: 205 mg/dL — ABNORMAL HIGH (ref 0–200)
HDL: 63.3 mg/dL (ref >39.00)
LDL Cholesterol: 124 mg/dL — ABNORMAL HIGH (ref 0–99)
NonHDL: 142.05
Total CHOL/HDL Ratio: 3
Triglycerides: 90 mg/dL (ref 0.0–149.0)
VLDL: 18 mg/dL (ref 0.0–40.0)

## 2018-12-28 NOTE — Progress Notes (Signed)
PCP notes:  Health Maintenance: Patient wants to get Shingrix if her insurance covers it.    Abnormal Screenings: none   Patient concerns: none   Nurse concerns: none   Next PCP appt.: 01/04/2019 @ 11:30 am

## 2018-12-28 NOTE — Progress Notes (Signed)
Subjective:   Diana Cook is a 68 y.o. female who presents for Medicare Annual (Subsequent) preventive examination.  Review of Systems:    This visit is being conducted through telemedicine via telephone at the nurse health advisor's home address due to the COVID-19 pandemic. This patient has given me verbal consent via doximity to conduct this visit, patient states they are participating from their home address. Patient and myself are on the telephone call. There is no referral for this visit. Some vital signs may be absent or patient reported.    Patient identification: identified by name, DOB, and current address   Cardiac Risk Factors include: advanced age (>73men, >38 women)     Objective:     Vitals: There were no vitals taken for this visit.  There is no height or weight on file to calculate BMI.  Advanced Directives 12/28/2018 08/19/2018 04/21/2018 12/08/2017 04/16/2017 10/01/2016 10/01/2016  Does Patient Have a Medical Advance Directive? Yes Yes No Yes Yes Yes Yes  Type of Paramedic of [place];Living will [place];Living will - [place];Living will [place];Living will - -  Does patient want to make changes to medical advance directive? - - - - No - Patient declined No - Patient declined No - Patient declined  Copy of [place] in Chart? No - copy requested No - copy requested - No - copy requested No - copy requested - -  Would patient like information on creating a medical advance directive? - - No - Patient declined - - - -    Tobacco Social History   Tobacco Use  Smoking Status Former Smoker  . Packs/day: 1.00  . Years: 42.00  . Pack years: 42.00  . Types: Cigarettes  . Quit date: 11/11/2010  . Years since quitting: 8.1  Smokeless Tobacco Never Used     Counseling given: Not Answered   Clinical Intake:  Pre-visit preparation completed: Yes  Pain : 0-10  Pain Score: 7  Pain Type: Chronic pain Pain Location: Back Pain Orientation: Lower Pain Descriptors / Indicators: Aching Pain Onset: More than a month ago Pain Frequency: Intermittent Pain Relieving Factors: injections  Pain Relieving Factors: injections  Nutritional Risks: None Diabetes: No  How often do you need to have someone help you when you read instructions, pamphlets, or other written materials from your doctor or pharmacy?: 1 - Never What is the last grade level you completed in school?: 12th  Interpreter Needed?: No  Information entered by :: CJohnson, LPN  Past Medical History:  Diagnosis Date  . Allergic rhinitis   . Arthritis   . Asthma    as a child, mild now  . Breast cancer ([place]) 12/2015   right breast cancer, lumpectomy and mammosite   . Colon polyps    colonoscopy 7/08, tubular adenoma  . Complete heart block ([place]) 8/13   s/p PPM implanted in [facility]  . COPD (chronic obstructive pulmonary disease) ([place])   . Myocardial infarction ([place]) 2011  . Osteopenia 10/2015  . Pacemaker    2011  . Personal history of radiation therapy 2017   right breast ca, mammosite placed  . Seizure disorder ([place])   . Seizures ([place])    first one was when she was 68 years old   . Small bowel obstruction ([place])    1988 and 2002  . Tobacco abuse    Past Surgical History:  Procedure Laterality Date  .  ABDOMINAL HYSTERECTOMY    . BREAST BIOPSY Right 2007   benign inflammatory changes, mass due to underwire bra  . BREAST BIOPSY Left 01/02/2016   columnar cell changes without atypical hyperplasia.  Marland Kitchen BREAST BIOPSY Right 12/06/2015   rt breast mass 10:00, bx done at Dr. Curly [place] office, invasive ductal carcinoma  . BREAST EXCISIONAL BIOPSY Left 01/02/2016   COLUMNAR CELL CHANGE AND HYPERPLASIA ASSOCIATED WITH LUMINAL AND STROMAL CALCIFICATIONS  . BREAST LUMPECTOMY Right 01/02/2016   invasive mammary carcinoma, clear margins, negative LN  . BREAST LUMPECTOMY WITH  SENTINEL LYMPH NODE BIOPSY Right 01/02/2016   pT1c, N0; ER/ PR 100%; Her 2 neu not over expressed: BREAST LUMPECTOMY WITH SENTINEL LYMPH NODE BX;  Surgeon: [name], MD;  Location: [facility] ORS;  Service: General;  Laterality: Right;  . CATARACT EXTRACTION Bilateral   . COLONOSCOPY  10/2015   Dr Ardis Hughs  . COLONOSCOPY WITH PROPOFOL N/A 08/19/2018   Procedure: COLONOSCOPY WITH PROPOFOL;  Surgeon: Virgel Manifold, MD;  Location: [facility] ENDOSCOPY;  Service: Endoscopy;  Laterality: N/A;  . ESOPHAGOGASTRODUODENOSCOPY (EGD) WITH PROPOFOL N/A 08/19/2018   Procedure: ESOPHAGOGASTRODUODENOSCOPY (EGD) WITH PROPOFOL;  Surgeon: Virgel Manifold, MD;  Location: [facility] ENDOSCOPY;  Service: Endoscopy;  Laterality: N/A;  . EXPLORATORY LAPAROTOMY  01/25/2001   Exploratory laparotomy, lysis of adhesions, identification of internal hernia secondary to omental adhesion. Prolonged postoperative ileus.  Marland Kitchen gyn surgery  1993   hysterectomy- form endometriosis  . LAPAROSCOPY    . PACEMAKER INSERTION  10/24/11   [employer] Advantio dual chamber PPM implanted by Dr Bunnie Philips at [facility] in [place]   Family History  Problem Relation Age of Onset  . Stroke Mother   . Heart disease Mother        MI  . Dementia Mother   . Coronary artery disease Father   . Parkinsonism Father   . Cancer - Cervical Daughter 61       died March 31, 2022  . Colon cancer Neg Hx   . Breast cancer Neg Hx    Social History   Socioeconomic History  . Marital status: Married    Spouse name: Not on file  . Number of children: Not on file  . Years of education: Not on file  . Highest education level: Not on file  Occupational History    Employer: RETIRED  Social Needs  . Financial resource strain: Not hard at all  . Food insecurity    Worry: Never true    Inability: Never true  . Transportation needs    Medical: No    Non-medical: No  Tobacco Use  . Smoking status: Former Smoker    Packs/day: 1.00     Years: 42.00    Pack years: 42.00    Types: Cigarettes    Quit date: 11/11/2010    Years since quitting: 8.1  . Smokeless tobacco: Never Used  Substance and Sexual Activity  . Alcohol use: Yes    Alcohol/week: 4.0 standard drinks    Types: 4 Glasses of wine per week    Comment: wine (pt said it's very occ)  . Drug use: No  . Sexual activity: Never  Lifestyle  . Physical activity    Days per week: 0 days    Minutes per session: 0 min  . Stress: Rather much  Relationships  . Social Herbalist on phone: Not on file    Gets together: Not on file    Attends religious  service: Not on file    Active member of club or organization: Not on file    Attends meetings of clubs or organizations: Not on file    Relationship status: Not on file  Other Topics Concern  . Not on file  Social History Narrative   Married 40 years.   Exercises, does walking tapes    Outpatient Encounter Medications as of 12/28/2018  Medication Sig  . acetaminophen (TYLENOL) 500 MG tablet Take 500 mg by mouth every 6 (six) hours as needed.  Marland Kitchen albuterol (PROVENTIL HFA;VENTOLIN HFA) 108 (90 Base) MCG/ACT inhaler Inhale 2 puffs into the lungs every 4 (four) hours as needed for wheezing.  Marland Kitchen atorvastatin (LIPITOR) 40 MG tablet Take 1 tablet (40 mg total) by mouth daily.  . Calcium-Vitamin D-Vitamin K 834-196-22 MG-UNT-MCG TABS Take 1 tablet by mouth 2 (two) times daily.   . famotidine (PEPCID) 20 MG tablet Take 1 tablet (20 mg total) by mouth daily.  . famotidine (PEPCID) 20 MG tablet Take 1 tablet (20 mg total) by mouth daily.  Marland Kitchen FLUoxetine (PROZAC) 20 MG capsule Take 1 capsule by mouth once daily  . furosemide (LASIX) 20 MG tablet TAKE 1 TABLET BY MOUTH ONCE DAILY AS NEEDED  . lamoTRIgine (LAMICTAL) 100 MG tablet Take 1 tablet by mouth 2 (two) times daily.  . metoprolol tartrate (LOPRESSOR) 100 MG tablet Take 1 tablet (100 mg) by mouth x 1 dose, 2 hours prior to your Cardiac CT  . OXYGEN Inhale into the  lungs. At nighttime  . zolpidem (AMBIEN) 10 MG tablet TAKE 1/2 TO 1 (ONE-HALF TO ONE) TABLET BY MOUTH AT BEDTIME AS NEEDED FOR SLEEP   No facility-administered encounter medications on file as of 12/28/2018.     Activities of Daily Living In your present state of health, do you have any difficulty performing the following activities: 12/28/2018  Hearing? N  Vision? N  Difficulty concentrating or making decisions? Y  Comment some short term memory loss  Walking or climbing stairs? Y  Comment has trouble climbing stairs  Dressing or bathing? N  Doing errands, shopping? N  Preparing Food and eating ? N  Using the Toilet? N  In the past six months, have you accidently leaked urine? N  Do you have problems with loss of bowel control? N  Managing your Medications? N  Managing your Finances? N  Housekeeping or managing your Housekeeping? N  Some recent data might be hidden    Patient Care Team: Tower, Wynelle Fanny, MD as PCP - General Deboraha Sprang, MD as Consulting Physician (Cardiology) Lorelee Cover., MD as Referring Physician (Ophthalmology) Tower, Wynelle Fanny, MD as Consulting Physician (Family Medicine) Bary Castilla Forest Gleason, MD (General Surgery)    Assessment:   This is a routine wellness examination for Journiee.  Exercise Activities and Dietary recommendations Current Exercise Habits: Home exercise routine, Type of exercise: walking, Time (Minutes): 30, Frequency (Times/Week): 7, Weekly Exercise (Minutes/Week): 210, Intensity: Mild, Exercise limited by: None identified  Goals    . Patient Stated     Starting 12/08/2017, I will continue to take medications as prescribed.     . Patient Stated     12/28/2018, I will try to start losing some weight in the future.       Fall Risk Fall Risk  12/28/2018 12/08/2017 09/30/2016 07/26/2015 07/11/2014  Falls in the past year? 1 No No No No  Number falls in past yr: 0 - - - -  Injury with Fall?  1 - - - -  Comment fractured wrist injury - - -  -  Risk for fall due to : Medication side effect;History of fall(s) - - - -  Follow up Falls evaluation completed;Falls prevention discussed - - - -   Is the patient's home free of loose throw rugs in walkways, pet beds, electrical cords, etc?   yes      Grab bars in the bathroom? yes      Handrails on the stairs?   yes      Adequate lighting?   yes  Timed Get Up and Go performed: N/A  Depression Screen PHQ 2/9 Scores 12/28/2018 12/08/2017 09/30/2016 07/26/2015  PHQ - 2 Score 0 1 2 0  PHQ- 9 Score 0 7 4 -     Cognitive Function MMSE - Mini Mental State Exam 12/28/2018 12/08/2017 09/30/2016 07/26/2015  Orientation to time 5 5 5 5   Orientation to Place 5 5 5 5   Registration 3 3 3 3   Attention/ Calculation 5 0 0 0  Recall 3 3 3 2   Recall-comments - - - pt was unable to recall 1 of 3 words  Language- name 2 objects - 0 0 0  Language- repeat 1 1 1 1   Language- follow 3 step command - 3 3 3   Language- read & follow direction - 0 0 0  Write a sentence - 0 0 0  Copy design - 0 0 0  Total score - 20 20 19   Mini Cog  Mini-Cog screen was completed. Maximum score is 22. A value of 0 denotes this part of the MMSE was not completed or the patient failed this part of the Mini-Cog screening.       Immunization History  Administered Date(s) Administered  . Influenza Whole 02/24/2005  . Influenza, High Dose Seasonal PF 11/04/2018  . Influenza,inj,Quad PF,6+ Mos 12/03/2012, 12/09/2013, 01/16/2015, 02/06/2016, 11/04/2016, 12/08/2017  . Pneumococcal Conjugate-13 07/11/2014  . Pneumococcal Polysaccharide-23 03/21/2005, 07/26/2015  . Td 01/09/1999, 06/14/2007  . Zoster 09/07/2015    Qualifies for Shingles Vaccine? Yes  Screening Tests Health Maintenance  Topic Date Due  . TETANUS/TDAP  02/24/2019 (Originally 06/13/2017)  . COLONOSCOPY  08/19/2019  . MAMMOGRAM  12/09/2019  . INFLUENZA VACCINE  Completed  . DEXA SCAN  Completed  . Hepatitis C Screening  Completed  . PNA vac Low Risk Adult   Completed    Cancer Screenings: Lung: Low Dose CT Chest recommended if Age 89-80 years, 30 pack-year currently smoking OR have quit w/in 15years. Patient does not qualify. Breast:  Up to date on Mammogram? Yes, completed 12/09/2018   Up to date of Bone Density/Dexa? Yes, completed 01/26/2018 Colorectal: completed 08/19/2018  Additional Screenings:  Hepatitis C Screening: 09/30/2016     Plan:    Patient will try to lose some weight in the future.   I have personally reviewed and noted the following in the patient's chart:   . Medical and social history . Use of alcohol, tobacco or illicit drugs  . Current medications and supplements . Functional ability and status . Nutritional status . Physical activity . Advanced directives . List of other physicians . Hospitalizations, surgeries, and ER visits in previous 12 months . Vitals . Screenings to include cognitive, depression, and falls . Referrals and appointments  In addition, I have reviewed and discussed with patient certain preventive protocols, quality metrics, and best practice recommendations. A written personalized care plan for preventive services as well as general preventive health recommendations were  provided to patient.     Andrez Grime, LPN  [id number]

## 2018-12-28 NOTE — Patient Instructions (Signed)
Diana Cook , Thank you for taking time to come for your Medicare Wellness Visit. I appreciate your ongoing commitment to your health goals. Please review the following plan we discussed and let me know if I can assist you in the future.   Screening recommendations/referrals: Colonoscopy: up to date, completed 08/19/2018 Mammogram: up to date, completed 12/09/2018 Bone Density: up to date, completed 01/26/2018 Recommended yearly ophthalmology/optometry visit for glaucoma screening and checkup Recommended yearly dental visit for hygiene and checkup  Vaccinations: Influenza vaccine: up to date, completed 11/04/2018 Pneumococcal vaccine: Completed series Tdap vaccine: decline Shingles vaccine: will get if insurance covers this     Advanced directives: Please bring a copy of your POA (Power of Attorney) and/or Living Will to your next appointment.   Conditions/risks identified: none  Next appointment: 01/04/2019 @ 11:30 am    Preventive Care 65 Years and Older, Female Preventive care refers to lifestyle choices and visits with your health care provider that can promote health and wellness. What does preventive care include?  A yearly physical exam. This is also called an annual well check.  Dental exams once or twice a year.  Routine eye exams. Ask your health care provider how often you should have your eyes checked.  Personal lifestyle choices, including:  Daily care of your teeth and gums.  Regular physical activity.  Eating a healthy diet.  Avoiding tobacco and drug use.  Limiting alcohol use.  Practicing safe sex.  Taking low-dose aspirin every day.  Taking vitamin and mineral supplements as recommended by your health care provider. What happens during an annual well check? The services and screenings done by your health care provider during your annual well check will depend on your age, overall health, lifestyle risk factors, and family history of disease. Counseling   Your health care provider may ask you questions about your:  Alcohol use.  Tobacco use.  Drug use.  Emotional well-being.  Home and relationship well-being.  Sexual activity.  Eating habits.  History of falls.  Memory and ability to understand (cognition).  Work and work Statistician.  Reproductive health. Screening  You may have the following tests or measurements:  Height, weight, and BMI.  Blood pressure.  Lipid and cholesterol levels. These may be checked every 5 years, or more frequently if you are over 92 years old.  Skin check.  Lung cancer screening. You may have this screening every year starting at age 40 if you have a 30-pack-year history of smoking and currently smoke or have quit within the past 15 years.  Fecal occult blood test (FOBT) of the stool. You may have this test every year starting at age 75.  Flexible sigmoidoscopy or colonoscopy. You may have a sigmoidoscopy every 5 years or a colonoscopy every 10 years starting at age 87.  Hepatitis C blood test.  Hepatitis B blood test.  Sexually transmitted disease (STD) testing.  Diabetes screening. This is done by checking your blood sugar (glucose) after you have not eaten for a while (fasting). You may have this done every 1-3 years.  Bone density scan. This is done to screen for osteoporosis. You may have this done starting at age 43.  Mammogram. This may be done every 1-2 years. Talk to your health care provider about how often you should have regular mammograms. Talk with your health care provider about your test results, treatment options, and if necessary, the need for more tests. Vaccines  Your health care provider may recommend certain vaccines, such  as:  Influenza vaccine. This is recommended every year.  Tetanus, diphtheria, and acellular pertussis (Tdap, Td) vaccine. You may need a Td booster every 10 years.  Zoster vaccine. You may need this after age 31.  Pneumococcal 13-valent  conjugate (PCV13) vaccine. One dose is recommended after age 55.  Pneumococcal polysaccharide (PPSV23) vaccine. One dose is recommended after age 26. Talk to your health care provider about which screenings and vaccines you need and how often you need them. This information is not intended to replace advice given to you by your health care provider. Make sure you discuss any questions you have with your health care provider. Document Released: 03/09/2015 Document Revised: 10/31/2015 Document Reviewed: 12/12/2014 Elsevier Interactive Patient Education  2017 [place] Prevention in the Home Falls can cause injuries. They can happen to people of all ages. There are many things you can do to make your home safe and to help prevent falls. What can I do on the outside of my home?  Regularly fix the edges of walkways and driveways and fix any cracks.  Remove anything that might make you trip as you walk through a door, such as a raised step or threshold.  Trim any bushes or trees on the path to your home.  Use bright outdoor lighting.  Clear any walking paths of anything that might make someone trip, such as rocks or tools.  Regularly check to see if handrails are loose or broken. Make sure that both sides of any steps have handrails.  Any raised decks and porches should have guardrails on the edges.  Have any leaves, snow, or ice cleared regularly.  Use sand or salt on walking paths during winter.  Clean up any spills in your garage right away. This includes oil or grease spills. What can I do in the bathroom?  Use night lights.  Install grab bars by the toilet and in the tub and shower. Do not use towel bars as grab bars.  Use non-skid mats or decals in the tub or shower.  If you need to sit down in the shower, use a plastic, non-slip stool.  Keep the floor dry. Clean up any water that spills on the floor as soon as it happens.  Remove soap buildup in the tub or  shower regularly.  Attach bath mats securely with double-sided non-slip rug tape.  Do not have throw rugs and other things on the floor that can make you trip. What can I do in the bedroom?  Use night lights.  Make sure that you have a light by your bed that is easy to reach.  Do not use any sheets or blankets that are too big for your bed. They should not hang down onto the floor.  Have a firm chair that has side arms. You can use this for support while you get dressed.  Do not have throw rugs and other things on the floor that can make you trip. What can I do in the kitchen?  Clean up any spills right away.  Avoid walking on wet floors.  Keep items that you use a lot in easy-to-reach places.  If you need to reach something above you, use a strong step stool that has a grab bar.  Keep electrical cords out of the way.  Do not use floor polish or wax that makes floors slippery. If you must use wax, use non-skid floor wax.  Do not have throw rugs and other things on the  floor that can make you trip. What can I do with my stairs?  Do not leave any items on the stairs.  Make sure that there are handrails on both sides of the stairs and use them. Fix handrails that are broken or loose. Make sure that handrails are as long as the stairways.  Check any carpeting to make sure that it is firmly attached to the stairs. Fix any carpet that is loose or worn.  Avoid having throw rugs at the top or bottom of the stairs. If you do have throw rugs, attach them to the floor with carpet tape.  Make sure that you have a light switch at the top of the stairs and the bottom of the stairs. If you do not have them, ask someone to add them for you. What else can I do to help prevent falls?  Wear shoes that:  Do not have high heels.  Have rubber bottoms.  Are comfortable and fit you well.  Are closed at the toe. Do not wear sandals.  If you use a stepladder:  Make sure that it is fully  opened. Do not climb a closed stepladder.  Make sure that both sides of the stepladder are locked into place.  Ask someone to hold it for you, if possible.  Clearly mark and make sure that you can see:  Any grab bars or handrails.  First and last steps.  Where the edge of each step is.  Use tools that help you move around (mobility aids) if they are needed. These include:  Canes.  Walkers.  Scooters.  Crutches.  Turn on the lights when you go into a dark area. Replace any light bulbs as soon as they burn out.  Set up your furniture so you have a clear path. Avoid moving your furniture around.  If any of your floors are uneven, fix them.  If there are any pets around you, be aware of where they are.  Review your medicines with your doctor. Some medicines can make you feel dizzy. This can increase your chance of falling. Ask your doctor what other things that you can do to help prevent falls. This information is not intended to replace advice given to you by your health care provider. Make sure you discuss any questions you have with your health care provider. Document Released: 12/07/2008 Document Revised: 07/19/2015 Document Reviewed: 03/17/2014 Elsevier Interactive Patient Education  2017 [employer].

## 2018-12-29 LAB — HEMOGLOBIN A1C: Hgb A1c MFr Bld: 5.6 % (ref 4.6–6.5)

## 2019-01-04 ENCOUNTER — Ambulatory Visit (INDEPENDENT_AMBULATORY_CARE_PROVIDER_SITE_OTHER): Payer: Medicare Other | Admitting: Family Medicine

## 2019-01-04 ENCOUNTER — Other Ambulatory Visit: Payer: Self-pay

## 2019-01-04 ENCOUNTER — Encounter: Payer: Self-pay | Admitting: Family Medicine

## 2019-01-04 VITALS — BP 108/68 | HR 74 | Temp 97.1°F | Ht 67.5 in | Wt 186.4 lb

## 2019-01-04 DIAGNOSIS — E7800 Pure hypercholesterolemia, unspecified: Secondary | ICD-10-CM | POA: Diagnosis not present

## 2019-01-04 DIAGNOSIS — R7309 Other abnormal glucose: Secondary | ICD-10-CM

## 2019-01-04 DIAGNOSIS — C50411 Malignant neoplasm of upper-outer quadrant of right female breast: Secondary | ICD-10-CM

## 2019-01-04 DIAGNOSIS — J439 Emphysema, unspecified: Secondary | ICD-10-CM

## 2019-01-04 DIAGNOSIS — M810 Age-related osteoporosis without current pathological fracture: Secondary | ICD-10-CM

## 2019-01-04 DIAGNOSIS — Z170 Estrogen receptor positive status [ER+]: Secondary | ICD-10-CM | POA: Diagnosis not present

## 2019-01-04 DIAGNOSIS — K219 Gastro-esophageal reflux disease without esophagitis: Secondary | ICD-10-CM

## 2019-01-04 DIAGNOSIS — R569 Unspecified convulsions: Secondary | ICD-10-CM

## 2019-01-04 MED ORDER — FLUOXETINE HCL 20 MG PO CAPS: 20.0000 mg | ORAL_CAPSULE | Freq: Every day | ORAL | 3 refills | Status: DC

## 2019-01-04 MED ORDER — ALBUTEROL SULFATE HFA 108 (90 BASE) MCG/ACT IN AERS: 2.0000 | INHALATION_SPRAY | RESPIRATORY_TRACT | 1 refills | Status: DC | PRN

## 2019-01-04 NOTE — Progress Notes (Signed)
Subjective:    Patient ID: Diana Cook, female    DOB: 02-16-1951, 68 y.o.   MRN: [id number]  HPI Here for annual f/u of chronic health problems  Being safe -wears mask  With family-socializes outdoors    Had amw on 11/3 Interested in shingrix  Had zostavax 7/17   Mammogram 10/20  H/o R sided breast cancer -3 y No new developments  Saw her surgeon last week- dense breasts  Self breast exam -no changes   dexa 12/19- OP forearm Was briefly on arimidex Used to smoke  Falls- fell and broke wrist this summer (tripped)  Fractures -wrist fracture  Supplements - takes ca and D  D level is 39 Exercise -walking  (a struggle with back and leg problems) but still does it  Unsure if open to medication   42 pack year smoking hx  Currently quit  Copd--being worked up for this   Has h/o diastolic dysfunction   H/o seizure  None recent  On lamictal and doing ok   Lot of back problems Has had steroid injections   GERD- being tx for H pylori   Wt Readings from Last 3 Encounters:  01/04/19 186 lb 6 oz (84.5 kg)  11/16/18 185 lb (83.9 kg)  10/28/18 184 lb 4 oz (83.6 kg)  she is hungry (no control) all the time  Can do weight watchers (has lost in the past)-has to be accountable  28.76 kg/m   BP Readings from Last 3 Encounters:  01/04/19 108/68  12/15/18 (!) 100/52  11/16/18 130/70   Pulse Readings from Last 3 Encounters:  01/04/19 74  12/15/18 60  11/16/18 64   Hyperlipidemia Lab Results  Component Value Date   CHOL 205 (H) 12/28/2018   CHOL 167 12/08/2017   CHOL 190 09/30/2016   Lab Results  Component Value Date   HDL 63.30 12/28/2018   HDL 55.40 12/08/2017   HDL 67.50 09/30/2016   Lab Results  Component Value Date   LDLCALC 124 (H) 12/28/2018   LDLCALC 92 12/08/2017   LDLCALC 108 (H) 09/30/2016   Lab Results  Component Value Date   TRIG 90.0 12/28/2018   TRIG 99.0 12/08/2017   TRIG 69.0 09/30/2016   Lab Results  Component Value Date   CHOLHDL 3 12/28/2018   CHOLHDL 3 12/08/2017   CHOLHDL 3 09/30/2016   No results found for: LDLDIRECT On atorvastatin and diet  Had CAD on CT scan  LDL is up a bit- she eats too much in general  Has not been on atorvastin but a week- not long enough to see a big change      Elevated glucose in the past Lab Results  Component Value Date   HGBA1C 5.6 12/28/2018    Mood Fluoxetine ambien   Other labs Lab Results  Component Value Date   CREATININE 0.84 12/28/2018   BUN 21 12/28/2018   NA 137 12/28/2018   K 5.3 (H) 12/28/2018   CL 103 12/28/2018   CO2 26 12/28/2018   Takes lasix Does not take K    Lab Results  Component Value Date   ALT 19 12/28/2018   AST 21 12/28/2018   ALKPHOS 85 12/28/2018   BILITOT 0.8 12/28/2018   Lab Results  Component Value Date   WBC 5.7 12/28/2018   HGB 13.0 12/28/2018   HCT 39.8 12/28/2018   MCV 91.2 12/28/2018   PLT 284.0 12/28/2018   Lab Results  Component Value Date   TSH 1.02  12/08/2017    Patient Active Problem List   Diagnosis Date Noted  . Gastroesophageal reflux disease   . Stomach irritation   . Abnormal CT scan, esophagus   . Polyp of ascending colon   . Personal history of tobacco use, presenting hazards to health 01/18/2018  . Grade I diastolic dysfunction 71/69/6789  . Globus pharyngeus 12/21/2017  . Pulmonary emphysema ([place]) 12/15/2017  . Hyperlipidemia 12/07/2017  . Seizures ([place]) 10/01/2016  . Elevated glucose 08/31/2016  . Grief reaction 04/11/2016  . Malignant neoplasm of upper-outer quadrant of right breast in female, estrogen receptor positive ([place]) 12/11/2015  . Osteoporosis 11/22/2015  . Estrogen deficiency 09/07/2015  . Bruising 09/07/2015  . Wrinkles 09/07/2015  . Post herpetic neuralgia 09/01/2014  . Encounter for Medicare annual wellness exam 07/11/2014  . Routine general medical examination at a health care facility 06/26/2013  . H/O small bowel obstruction 04/15/2013  . SOB (shortness of  breath) on exertion 02/10/2013  . Complete heart block ([place]) 11/03/2011  . [employer] 11/03/2011  . Lumbar spinal stenosis 09/02/2011  . POSTMENOPAUSAL STATUS 08/06/2009  . HIDRADENITIS SUPPURATIVA 06/26/2008  . HERNIATED DISC 12/14/2007  . BACK, LOWER, PAIN 12/02/2007  . HX, PERSONAL, COLONIC POLYPS 09/14/2006  . Former smoker 06/10/2006  . ASTHMA 06/10/2006  . H/O idiopathic seizure 06/10/2006   Past Medical History:  Diagnosis Date  . Allergic rhinitis   . Arthritis   . Asthma    as a child, mild now  . Breast cancer ([place]) 12/2015   right breast cancer, lumpectomy and mammosite   . Colon polyps    colonoscopy 7/08, tubular adenoma  . Complete heart block ([place]) 8/13   s/p PPM implanted in [facility]  . COPD (chronic obstructive pulmonary disease) ([place])   . Myocardial infarction ([place]) 2011  . Osteopenia 10/2015  . Pacemaker    2011  . Personal history of radiation therapy 2017   right breast ca, mammosite placed  . Seizure disorder ([place])   . Seizures ([place])    first one was when she was 68 years old   . Small bowel obstruction ([place])    1988 and 2002  . Tobacco abuse    Past Surgical History:  Procedure Laterality Date  . ABDOMINAL HYSTERECTOMY    . BREAST BIOPSY Right 2007   benign inflammatory changes, mass due to underwire bra  . BREAST BIOPSY Left 01/02/2016   columnar cell changes without atypical hyperplasia.  Marland Kitchen BREAST BIOPSY Right 12/06/2015   rt breast mass 10:00, bx done at Dr. Curly [place] office, invasive ductal carcinoma  . BREAST EXCISIONAL BIOPSY Left 01/02/2016   COLUMNAR CELL CHANGE AND HYPERPLASIA ASSOCIATED WITH LUMINAL AND STROMAL CALCIFICATIONS  . BREAST LUMPECTOMY Right 01/02/2016   invasive mammary carcinoma, clear margins, negative LN  . BREAST LUMPECTOMY WITH SENTINEL LYMPH NODE BIOPSY Right 01/02/2016   pT1c, N0; ER/ PR 100%; Her 2 neu not over expressed: BREAST LUMPECTOMY WITH SENTINEL LYMPH NODE BX;  Surgeon: [name], MD;  Location: [facility] ORS;  Service: General;  Laterality: Right;  . CATARACT EXTRACTION Bilateral   . COLONOSCOPY  10/2015   Dr Ardis Hughs  . COLONOSCOPY WITH PROPOFOL N/A 08/19/2018   Procedure: COLONOSCOPY WITH PROPOFOL;  Surgeon: Virgel Manifold, MD;  Location: [facility] ENDOSCOPY;  Service: Endoscopy;  Laterality: N/A;  . ESOPHAGOGASTRODUODENOSCOPY (EGD) WITH PROPOFOL N/A 08/19/2018   Procedure: ESOPHAGOGASTRODUODENOSCOPY (EGD) WITH PROPOFOL;  Surgeon: Virgel Manifold, MD;  Location: [facility] ENDOSCOPY;  Service: Endoscopy;  Laterality:  N/A;  . EXPLORATORY LAPAROTOMY  01/25/2001   Exploratory laparotomy, lysis of adhesions, identification of internal hernia secondary to omental adhesion. Prolonged postoperative ileus.  Marland Kitchen gyn surgery  1993   hysterectomy- form endometriosis  . LAPAROSCOPY    . PACEMAKER INSERTION  10/24/11   [employer] Advantio dual chamber PPM implanted by Dr Bunnie Philips at [facility] in [place] History   Tobacco Use  . Smoking status: Former Smoker    Packs/day: 1.00    Years: 42.00    Pack years: 42.00    Types: Cigarettes    Quit date: 11/11/2010    Years since quitting: 8.1  . Smokeless tobacco: Never Used  Substance Use Topics  . Alcohol use: Yes    Alcohol/week: 4.0 standard drinks    Types: 4 Glasses of wine per week    Comment: wine (pt said it's very occ)  . Drug use: No   Family History  Problem Relation Age of Onset  . Stroke Mother   . Heart disease Mother        MI  . Dementia Mother   . Coronary artery disease Father   . Parkinsonism Father   . Cancer - Cervical Daughter 26       died Mar 15, 2022  . Colon cancer Neg Hx   . Breast cancer Neg Hx    Allergies  Allergen Reactions  . Codeine Nausea And Vomiting    REACTION: nausea  . Morphine And Related Nausea Only    Nausea    Current Outpatient Medications on File Prior to Visit  Medication Sig Dispense Refill  . acetaminophen (TYLENOL) 500 MG tablet  Take 500 mg by mouth every 6 (six) hours as needed.    Marland Kitchen atorvastatin (LIPITOR) 40 MG tablet Take 1 tablet (40 mg total) by mouth daily. 90 tablet 3  . Calcium-Vitamin D-Vitamin K 250-539-76 MG-UNT-MCG TABS Take 1 tablet by mouth 2 (two) times daily.     . furosemide (LASIX) 20 MG tablet TAKE 1 TABLET BY MOUTH ONCE DAILY AS NEEDED 90 tablet 0  . lamoTRIgine (LAMICTAL) 100 MG tablet Take 1 tablet by mouth 2 (two) times daily.    . OXYGEN Inhale into the lungs. At nighttime    . zolpidem (AMBIEN) 10 MG tablet TAKE 1/2 TO 1 (ONE-HALF TO ONE) TABLET BY MOUTH AT BEDTIME AS NEEDED FOR SLEEP 30 tablet 5  . famotidine (PEPCID) 20 MG tablet Take 1 tablet (20 mg total) by mouth daily. (Patient not taking: Reported on 01/04/2019) 30 tablet 1  . famotidine (PEPCID) 20 MG tablet Take 1 tablet (20 mg total) by mouth daily. (Patient not taking: Reported on 01/04/2019) 90 tablet 0   No current facility-administered medications on file prior to visit.     Review of Systems  Constitutional: Negative for activity change, appetite change, fatigue, fever and unexpected weight change.  HENT: Negative for congestion, ear pain, rhinorrhea, sinus pressure and sore throat.   Eyes: Negative for pain, redness and visual disturbance.  Respiratory: Negative for cough, shortness of breath and wheezing.   Cardiovascular: Negative for chest pain and palpitations.  Gastrointestinal: Negative for abdominal pain, blood in stool, constipation and diarrhea.  Endocrine: Negative for polydipsia and polyuria.  Genitourinary: Negative for dysuria, frequency and urgency.  Musculoskeletal: Negative for arthralgias, back pain and myalgias.  Skin: Negative for pallor and rash.  Allergic/Immunologic: Negative for environmental allergies.  Neurological: Negative for dizziness, syncope and headaches.  Hematological: Negative for adenopathy.  Does not bruise/bleed easily.  Psychiatric/Behavioral: Positive for sleep disturbance. Negative for  decreased concentration and dysphoric mood. The patient is not nervous/anxious.        Lost her daughter 2 y ago  Still grieving  Overall doing ok        Objective:   Physical Exam Constitutional:      General: She is not in acute distress.    Appearance: Normal appearance. She is well-developed. She is not ill-appearing or diaphoretic.  HENT:     Head: Normocephalic and atraumatic.     Right Ear: Tympanic membrane, ear canal and external ear normal.     Left Ear: Tympanic membrane, ear canal and external ear normal.     Nose: Nose normal. No congestion.     Mouth/Throat:     Mouth: Mucous membranes are moist.     Pharynx: Oropharynx is clear. No posterior oropharyngeal erythema.  Eyes:     General: No scleral icterus.    Extraocular Movements: Extraocular movements intact.     Conjunctiva/sclera: Conjunctivae normal.     Pupils: Pupils are equal, round, and reactive to light.  Neck:     Musculoskeletal: Normal range of motion and neck supple. No neck rigidity or muscular tenderness.     Thyroid: No thyromegaly.     Vascular: No carotid bruit or JVD.  Cardiovascular:     Rate and Rhythm: Normal rate and regular rhythm.     Pulses: Normal pulses.     Heart sounds: Normal heart sounds. No gallop.   Pulmonary:     Effort: Pulmonary effort is normal. No respiratory distress.     Breath sounds: Normal breath sounds. No wheezing.     Comments: Good air exch Chest:     Chest wall: No tenderness.  Abdominal:     General: Bowel sounds are normal. There is no distension or abdominal bruit.     Palpations: Abdomen is soft. There is no mass.     Tenderness: There is no abdominal tenderness.     Hernia: No hernia is present.  Genitourinary:    Comments: Breast exam: No mass, nodules, thickening, tenderness, bulging, retraction, inflamation, nipple discharge or skin changes noted.  No axillary or clavicular LA.     Surgical change R breast Musculoskeletal: Normal range of motion.         General: No tenderness.     Right lower leg: No edema.     Left lower leg: No edema.  Lymphadenopathy:     Cervical: No cervical adenopathy.  Skin:    General: Skin is warm and dry.     Coloration: Skin is not pale.     Findings: No erythema or rash.     Comments: Solar lentigines diffusely   Neurological:     Mental Status: She is alert. Mental status is at baseline.     Cranial Nerves: No cranial nerve deficit.     Motor: No abnormal muscle tone.     Coordination: Coordination normal.     Gait: Gait normal.     Deep Tendon Reflexes: Reflexes are normal and symmetric. Reflexes normal.  Psychiatric:        Mood and Affect: Mood normal.        Cognition and Memory: Cognition and memory normal.           Assessment & Plan:   Problem List Items Addressed This Visit      Respiratory   Pulmonary emphysema ([place])    Pt  does get sob on exertion  Has upcoming pulmonary appt      Relevant Medications   albuterol (VENTOLIN HFA) 108 (90 Base) MCG/ACT inhaler     Digestive   Gastroesophageal reflux disease    Being treated for H pylori        Musculoskeletal and Integument   Osteoporosis - Primary    dexa 12/19 - OP of forearm  Prior smoker  Briefly on arimidex for breast cancer  One fall with wrist fx this summer  Taking ca and D (D level 39) Trying to get back to walking Disc medication options- I feel she would benefit Given info on alendronate, evista and prolia today (usnure of inst cov) She will rev and let us know if she wants to try one  dexa due 12/21        Other   Malignant neoplasm of upper-outer quadrant of right breast in female, estrogen receptor positive ([place])    3 y s/p treatment and lumpectomy on R  No new developments Did not tolerate arimidex  Watching bone density  Mammogram utd 10/20       Elevated glucose    Lab Results  Component Value Date   HGBA1C 5.6 12/28/2018   disc imp of low glycemic diet and wt loss to prevent DM2        Seizures ([facility])    Doing well so far on lamictal-no problems No recent seizures      Hyperlipidemia    Disc goals for lipids and reasons to control them Rev last labs with pt Rev low sat fat diet in detail Has been on atorvastatin for just under a week  Diagnosed with CAD on CT scan Enc to be aggressive with cholesterol Expect improvement

## 2019-01-04 NOTE — Assessment & Plan Note (Signed)
Disc goals for lipids and reasons to control them Rev last labs with pt Rev low sat fat diet in detail Has been on atorvastatin for just under a week  Diagnosed with CAD on CT scan Enc to be aggressive with cholesterol Expect improvement

## 2019-01-04 NOTE — Patient Instructions (Addendum)
If you are interested in the new shingles vaccine (Shingrix) - call your local pharmacy to check on coverage and availability  If affordable, get on a wait list at your pharmacy to get the vaccine.    for cholesterol Avoid red meat/ fried foods/ egg yolks/ fatty breakfast meats/ butter, cheese and high fat dairy/ and shellfish    Try to get most of your carbohydrates from produce (with the exception of white potatoes)  Eat less bread/pasta/rice/snack foods/cereals/sweets and other items from the middle of the grocery store (processed carbs)   Here is some information about different osteoporosis medications   Take care of yourself

## 2019-01-04 NOTE — Assessment & Plan Note (Signed)
Lab Results  Component Value Date   HGBA1C 5.6 12/28/2018   disc imp of low glycemic diet and wt loss to prevent DM2

## 2019-01-04 NOTE — Assessment & Plan Note (Signed)
Being treated for H pylori

## 2019-01-04 NOTE — Assessment & Plan Note (Signed)
Doing well so far on lamictal-no problems No recent seizures

## 2019-01-04 NOTE — Assessment & Plan Note (Signed)
Pt does get sob on exertion  Has upcoming pulmonary appt

## 2019-01-04 NOTE — Assessment & Plan Note (Signed)
dexa 12/19 - OP of forearm  Prior smoker  Briefly on arimidex for breast cancer  One fall with wrist fx this summer  Taking ca and D (D level 39) Trying to get back to walking Disc medication options- I feel she would benefit Given info on alendronate, evista and prolia today (usnure of inst cov) She will rev and let us know if she wants to try one  dexa due 12/21

## 2019-01-04 NOTE — Assessment & Plan Note (Signed)
3 y s/p treatment and lumpectomy on R  No new developments Did not tolerate arimidex  Watching bone density  Mammogram utd 10/20

## 2019-01-12 ENCOUNTER — Telehealth: Payer: Self-pay | Admitting: *Deleted

## 2019-01-12 DIAGNOSIS — Z8619 Personal history of other infectious and parasitic diseases: Secondary | ICD-10-CM | POA: Diagnosis not present

## 2019-01-12 NOTE — Telephone Encounter (Signed)
Left a voicemail for patient to notify her that it is time to schedule annual low dose lung cancer screening CT scan. Instructed patient to call back to verify information prior to the scan being scheduled.

## 2019-01-14 LAB — H. PYLORI BREATH TEST: H pylori Breath Test: NEGATIVE

## 2019-01-14 NOTE — Progress Notes (Signed)
Remote pacemaker transmission.   

## 2019-01-17 ENCOUNTER — Telehealth: Payer: Self-pay | Admitting: *Deleted

## 2019-01-17 ENCOUNTER — Encounter: Payer: Self-pay | Admitting: *Deleted

## 2019-01-17 DIAGNOSIS — Z122 Encounter for screening for malignant neoplasm of respiratory organs: Secondary | ICD-10-CM

## 2019-01-17 DIAGNOSIS — Z87891 Personal history of nicotine dependence: Secondary | ICD-10-CM

## 2019-01-17 NOTE — Addendum Note (Signed)
Addended by: Lieutenant Diego on: 01/17/2019 11:37 AM   Modules accepted: Orders

## 2019-01-17 NOTE — Telephone Encounter (Signed)
Patient has been notified that annual lung cancer screening low dose CT scan is due currently or will be in near future. Confirmed that patient is within the age range of 55-77, and asymptomatic, (no signs or symptoms of lung cancer). Patient denies illness that would prevent curative treatment for lung cancer if found. Verified smoking history, (former, quit 11/11/10, 42 pack year). The shared decision making visit was done 01/18/18. Patient is agreeable for CT scan being scheduled.

## 2019-01-17 NOTE — Telephone Encounter (Signed)
Left message for patient to notify them that it is time to schedule annual low dose lung cancer screening CT scan. Instructed patient to call back to verify information prior to the scan being scheduled.  

## 2019-01-19 ENCOUNTER — Ambulatory Visit
Admission: RE | Admit: 2019-01-19 | Discharge: 2019-01-19 | Disposition: A | Discharge status: Home / Self Care | Payer: Medicare Other | Source: Ambulatory Visit | Attending: Oncology | Admitting: Oncology

## 2019-01-19 ENCOUNTER — Other Ambulatory Visit: Payer: Self-pay

## 2019-01-19 DIAGNOSIS — Z122 Encounter for screening for malignant neoplasm of respiratory organs: Secondary | ICD-10-CM | POA: Diagnosis not present

## 2019-01-19 DIAGNOSIS — Z87891 Personal history of nicotine dependence: Secondary | ICD-10-CM

## 2019-01-25 ENCOUNTER — Encounter: Payer: Self-pay | Admitting: *Deleted

## 2019-01-27 ENCOUNTER — Encounter: Payer: Self-pay | Admitting: Pulmonary Disease

## 2019-01-27 ENCOUNTER — Other Ambulatory Visit: Payer: Self-pay

## 2019-01-27 ENCOUNTER — Ambulatory Visit (INDEPENDENT_AMBULATORY_CARE_PROVIDER_SITE_OTHER): Payer: Medicare Other | Admitting: Pulmonary Disease

## 2019-01-27 VITALS — BP 112/78 | HR 88 | Temp 97.5°F | Ht 67.5 in | Wt 194.0 lb

## 2019-01-27 DIAGNOSIS — J439 Emphysema, unspecified: Secondary | ICD-10-CM

## 2019-01-27 DIAGNOSIS — Z87891 Personal history of nicotine dependence: Secondary | ICD-10-CM | POA: Diagnosis not present

## 2019-01-27 DIAGNOSIS — G4734 Idiopathic sleep related nonobstructive alveolar hypoventilation: Secondary | ICD-10-CM | POA: Diagnosis not present

## 2019-01-27 DIAGNOSIS — R0602 Shortness of breath: Secondary | ICD-10-CM

## 2019-01-27 DIAGNOSIS — I5189 Other ill-defined heart diseases: Secondary | ICD-10-CM

## 2019-01-27 DIAGNOSIS — I519 Heart disease, unspecified: Secondary | ICD-10-CM | POA: Diagnosis not present

## 2019-01-27 MED ORDER — ANORO ELLIPTA 62.5-25 MCG/INH IN AEPB: 1.0000 | INHALATION_SPRAY | Freq: Every day | RESPIRATORY_TRACT | 6 refills | Status: DC

## 2019-01-27 NOTE — Patient Instructions (Signed)
1.  We will give you a trial of Anoro Ellipta 1 inhalation daily  2.  We will see him in follow-up in 4 months time call sooner should any new difficulties arise.

## 2019-01-27 NOTE — Progress Notes (Signed)
   Subjective:    Patient ID: Diana Cook, female    DOB: 08-20-1950, 68 y.o.   MRN: [id number]  HPI This is a 68 year old former smoker, who presents for follow-up of dyspnea on exertion.  She was initially evaluated on 21 December 2017.  Her issues with dyspnea on exertion, for the most part,  have resolved.  On her initial evaluation she was noted to have significant hypoxemia with exercise and nocturnal hypoxemia was confirmed by overnight oximetry.  Since that time we have been able to wean her off of daytime oxygen.  She remains on nighttime oxygen and need for this was confirmed by a second oximetry. He was given a trial of maintenance inhaler however she discontinued this.  She is only on albuterol as needed.  Has had to use it more frequently lately. Patient has had no fevers, chills or sweats.  She has had some nasal congestion issues but otherwise she voices no other complaint.  She feels well and looks well.  We discussed lung cancer screening CT results.  Review of Systems A 10 point review of systems was performed and it is as noted above otherwise negative.    Objective:   Physical Exam BP 112/78 (BP Location: Left Arm, Cuff Size: Normal)   Pulse 88   Temp (!) 97.5 F (36.4 C) (Temporal)   Ht 5' 7.5" (1.715 m)   Wt 194 lb (88 kg)   SpO2 93%   BMI 29.94 kg/m  GENERAL: Well-developed well-nourished woman in no acute distress.  She is fully ambulatory. HEAD: Normocephalic, atraumatic.  EYES: Pupils equal, round, reactive to light.  No scleral icterus.  MOUTH: Nose/mouth/throat not examined due to masking requirements for COVID 19. NECK: Supple. No thyromegaly. No nodules. No JVD.  Trachea midline. PULMONARY: Coarse breath sounds no other adventitious sounds. CARDIOVASCULAR: S1 and S2. Regular rate and rhythm.  GASTROINTESTINAL: Nondistended. MUSCULOSKELETAL: No joint deformity, no clubbing, no edema.  NEUROLOGIC: Awake, alert, oriented x4, no focal deficits. SKIN:  Intact,warm,dry, on limited exam no rashes. PSYCH: Normal mood and behavior.     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 Derrill Kay, MD [facility]   *This note was dictated using voice recognition software/Dragon.  Despite best efforts to proofread, errors can occur which can change the meaning.  Any change was purely unintentional.

## 2019-03-01 ENCOUNTER — Other Ambulatory Visit: Payer: Self-pay | Admitting: Internal Medicine

## 2019-03-02 ENCOUNTER — Other Ambulatory Visit: Payer: Self-pay

## 2019-03-02 MED ORDER — FUROSEMIDE 20 MG PO TABS: 20.0000 mg | ORAL_TABLET | Freq: Every day | ORAL | 0 refills | Status: DC | PRN

## 2019-03-02 NOTE — Telephone Encounter (Signed)
This is a [place] pt 

## 2019-03-10 ENCOUNTER — Encounter: Payer: Self-pay | Admitting: *Deleted

## 2019-03-10 DIAGNOSIS — M5136 Other intervertebral disc degeneration, lumbar region: Secondary | ICD-10-CM | POA: Diagnosis not present

## 2019-03-10 DIAGNOSIS — M5416 Radiculopathy, lumbar region: Secondary | ICD-10-CM | POA: Diagnosis not present

## 2019-03-18 ENCOUNTER — Telehealth: Payer: Self-pay

## 2019-03-18 NOTE — Telephone Encounter (Signed)
Patient called stating she is currently follow up with Dr.Byrnett.

## 2019-03-24 ENCOUNTER — Ambulatory Visit (INDEPENDENT_AMBULATORY_CARE_PROVIDER_SITE_OTHER): Payer: Medicare Other | Admitting: *Deleted

## 2019-03-24 DIAGNOSIS — I442 Atrioventricular block, complete: Secondary | ICD-10-CM | POA: Diagnosis not present

## 2019-03-24 LAB — CUP PACEART REMOTE DEVICE CHECK
Battery Remaining Longevity: 7 mo
Battery Remaining Percentage: 11 %
Brady Statistic RA Percent Paced: 7 %
Brady Statistic RV Percent Paced: 100 %
Date Time Interrogation Session: 20210128041100
Implantable Lead Implant Date: 20130830
Implantable Lead Implant Date: 20130830
Implantable Lead Location: 753859
Implantable Lead Location: 753860
Implantable Lead Model: 4456
Implantable Lead Model: 4479
Implantable Lead Serial Number: 473325
Implantable Lead Serial Number: 523784
Implantable Pulse Generator Implant Date: 20130830
Lead Channel Impedance Value: 400 Ohm
Lead Channel Impedance Value: 590 Ohm
Lead Channel Pacing Threshold Amplitude: 0.7 V
Lead Channel Pacing Threshold Pulse Width: 0.5 ms
Lead Channel Setting Pacing Amplitude: 2 V
Lead Channel Setting Pacing Amplitude: 2.4 V
Lead Channel Setting Pacing Pulse Width: 0.5 ms
Lead Channel Setting Sensing Sensitivity: 2.5 mV
Pulse Gen Serial Number: 134132

## 2019-03-25 NOTE — Progress Notes (Signed)
PPM Remote  

## 2019-04-02 ENCOUNTER — Other Ambulatory Visit: Payer: Self-pay | Admitting: Family Medicine

## 2019-04-04 NOTE — Telephone Encounter (Signed)
Name of Medication: Ambien Name of Pharmacy: [facility] Last Fill or Written Date and Quantity: 09/29/18 #30 tabs with 5 refills Last Office Visit and Type: CPE 01/04/19 Next Office Visit and Type: none scheduled

## 2019-04-08 NOTE — Progress Notes (Deleted)
Monthly remotes previously arranged.

## 2019-04-25 ENCOUNTER — Ambulatory Visit (INDEPENDENT_AMBULATORY_CARE_PROVIDER_SITE_OTHER): Payer: Medicare Other | Admitting: *Deleted

## 2019-04-25 ENCOUNTER — Telehealth: Payer: Self-pay | Admitting: Pulmonary Disease

## 2019-04-25 DIAGNOSIS — I442 Atrioventricular block, complete: Secondary | ICD-10-CM | POA: Diagnosis not present

## 2019-04-25 LAB — CUP PACEART REMOTE DEVICE CHECK
Battery Remaining Longevity: 5 mo
Battery Remaining Percentage: 8 %
Brady Statistic RA Percent Paced: 6 %
Brady Statistic RV Percent Paced: 100 %
Date Time Interrogation Session: 20210301041100
Implantable Lead Implant Date: 20130830
Implantable Lead Implant Date: 20130830
Implantable Lead Location: 753859
Implantable Lead Location: 753860
Implantable Lead Model: 4456
Implantable Lead Model: 4479
Implantable Lead Serial Number: 473325
Implantable Lead Serial Number: 523784
Implantable Pulse Generator Implant Date: 20130830
Lead Channel Impedance Value: 402 Ohm
Lead Channel Impedance Value: 595 Ohm
Lead Channel Pacing Threshold Amplitude: 0.7 V
Lead Channel Pacing Threshold Pulse Width: 0.5 ms
Lead Channel Setting Pacing Amplitude: 2 V
Lead Channel Setting Pacing Amplitude: 2.4 V
Lead Channel Setting Pacing Pulse Width: 0.5 ms
Lead Channel Setting Sensing Sensitivity: 2.5 mV
Pulse Gen Serial Number: 134132

## 2019-04-25 MED ORDER — BEVESPI AEROSPHERE 9-4.8 MCG/ACT IN AERO: 2.0000 | INHALATION_SPRAY | Freq: Two times a day (BID) | RESPIRATORY_TRACT | 5 refills | Status: DC

## 2019-04-25 NOTE — Telephone Encounter (Signed)
Spoke to pt, who stated that bevespi is a covered alternatived to [employer].   Dr. Patsey Berthold please advise. Thanks.

## 2019-04-25 NOTE — Telephone Encounter (Signed)
bevesti is one that is covered through insurance pt called back [phone]

## 2019-04-25 NOTE — Telephone Encounter (Signed)
Called and spoke to pt, who stated that Anoro is not covered by insurance.  I have recommended that pt contact insurance company to obtain list of covered alternatives.  Pt will call back with update.

## 2019-04-25 NOTE — Progress Notes (Signed)
PPM Remote  

## 2019-04-25 NOTE — Telephone Encounter (Signed)
Rx for Diana Cook has been sent to preferred pharmacy.  Pt is aware and voiced her understanding.  Nothing further is needed.

## 2019-04-25 NOTE — Addendum Note (Signed)
Addended by: Jennette Banker on: 04/25/2019 07:07 PM   Modules accepted: Level of Service

## 2019-04-25 NOTE — Telephone Encounter (Signed)
Lm for pt

## 2019-04-25 NOTE — Telephone Encounter (Signed)
Bevespi 2 puffs BID

## 2019-04-26 ENCOUNTER — Telehealth: Payer: Self-pay | Admitting: Pulmonary Disease

## 2019-04-26 ENCOUNTER — Other Ambulatory Visit: Payer: Self-pay

## 2019-04-26 NOTE — Telephone Encounter (Signed)
LVMTCB x 1

## 2019-04-27 ENCOUNTER — Ambulatory Visit
Admission: RE | Admit: 2019-04-27 | Discharge: 2019-04-27 | Disposition: A | Discharge status: Home / Self Care | Payer: Medicare Other | Source: Ambulatory Visit | Attending: Radiation Oncology | Admitting: Radiation Oncology

## 2019-04-27 ENCOUNTER — Encounter: Payer: Self-pay | Admitting: Radiation Oncology

## 2019-04-27 ENCOUNTER — Other Ambulatory Visit: Payer: Self-pay

## 2019-04-27 VITALS — BP 133/77 | HR 72 | Temp 97.7°F | Resp 20 | Wt 193.1 lb

## 2019-04-27 DIAGNOSIS — Z923 Personal history of irradiation: Secondary | ICD-10-CM | POA: Insufficient documentation

## 2019-04-27 DIAGNOSIS — Z853 Personal history of malignant neoplasm of breast: Secondary | ICD-10-CM | POA: Insufficient documentation

## 2019-04-27 DIAGNOSIS — C50411 Malignant neoplasm of upper-outer quadrant of right female breast: Secondary | ICD-10-CM

## 2019-04-27 DIAGNOSIS — Z170 Estrogen receptor positive status [ER+]: Secondary | ICD-10-CM

## 2019-04-27 NOTE — Telephone Encounter (Signed)
Spoke with pt and she stated that when she went the pharmacy to pick up [place] yesterday it was $400 plus and she spoke with her insurance company and they told her they will send over forms for [place] to complete for her Anoro to get approved. I let pt know that we would keep an eye out for them and complete them as soon as we could.

## 2019-04-27 NOTE — Progress Notes (Signed)
Radiation Oncology Follow up Note  Name: Diana Cook   Date:   04/27/2019 MRN:  [id number] DOB: 08/25/1950    This 69 y.o. female presents to the clinic today for 3-year follow-up status post whole breast radiation to right breast for ER/PR positive invasive mammary carcinoma.  REFERRING PROVIDER: Abner Greenspan, MD  HPI: Patient is a 69 year old female now out 3 years having completed whole breast radiation to her right breast for ER/PR positive invasive mammary carcinoma.  Seen today in routine follow-up she is doing well she still has occasional shooting pains in her right breast most likely secondary to nerve repair..  She is not on antiestrogen therapy based on her side effect profile.  Her last mammogram was back in October was BI-RADS 2 benign which I have reviewed.  She also had CT surveillance chest scan for lung cancer which I have reviewed which also benign.  COMPLICATIONS OF TREATMENT: none  FOLLOW UP COMPLIANCE: keeps appointments   PHYSICAL EXAM:  BP 133/77   Pulse 72   Temp 97.7 F (36.5 C) (Tympanic)   Resp 20   Wt 193 lb 1.6 oz (87.6 kg)   BMI 29.80 kg/m  Lungs are clear to A&P cardiac examination essentially unremarkable with regular rate and rhythm. No dominant mass or nodularity is noted in either breast in 2 positions examined. Incision is well-healed. No axillary or supraclavicular adenopathy is appreciated. Cosmetic result is excellent.  Well-developed well-nourished patient in NAD. HEENT reveals PERLA, EOMI, discs not visualized.  Oral cavity is clear. No oral mucosal lesions are identified. Neck is clear without evidence of cervical or supraclavicular adenopathy. Lungs are clear to A&P. Cardiac examination is essentially unremarkable with regular rate and rhythm without murmur rub or thrill. Abdomen is benign with no organomegaly or masses noted. Motor sensory and DTR levels are equal and symmetric in the upper and lower extremities. Cranial nerves II through XII  are grossly intact. Proprioception is intact. No peripheral adenopathy or edema is identified. No motor or sensory levels are noted. Crude visual fields are within normal range.  RADIOLOGY RESULTS: Mammograms and chest CT reviewed compatible with above-stated findings  PLAN: Present time patient is doing well no evidence of disease 3 years out.  I suggested for her breast pain occasional Tylenol with a warm compress.  I will see her back in 1 year for follow-up.  Patient knows to call with any concerns.  I would like to take this opportunity to thank you for allowing me to participate in the care of your patient.Noreene Filbert, MD

## 2019-04-27 NOTE — Telephone Encounter (Signed)
Nothing noted in encounter. Will sign off as an error.

## 2019-04-28 NOTE — Telephone Encounter (Signed)
Please see mychart message and advise. Thank you!

## 2019-05-02 MED ORDER — STIOLTO RESPIMAT 2.5-2.5 MCG/ACT IN AERS: 2.0000 | INHALATION_SPRAY | Freq: Every day | RESPIRATORY_TRACT | 3 refills | Status: DC

## 2019-05-02 NOTE — Telephone Encounter (Addendum)
Spoke to [place] with [place] and started PA for [employer].  Determination will be received via fax within 24hr.   PA # [id number]

## 2019-05-02 NOTE — Telephone Encounter (Signed)
Received denial letter from [facility] for Anoro. Pt must try and fail stiolto first.   Dr. Patsey Berthold please advise. Thanks

## 2019-05-02 NOTE — Telephone Encounter (Signed)
Okay to try Stiolto however that will not have inhaled corticosteroid which I think she needs as well.  Gone ahead and do Stiolto 2 inhalations daily.  The patient to feel free to come by if she does not understand how to use the inhaler well.

## 2019-05-02 NOTE — Telephone Encounter (Signed)
We have not received any forms at this time. I have contacted [employer] and requested that they fax over PA request for Anoro. Will await fax.

## 2019-05-02 NOTE — Telephone Encounter (Signed)
Called and spoke to pt, and relayed below message/recommendations.  Rx for Stiolto has been sent to preferred pharmacy.  Nothing further is needed.

## 2019-05-05 NOTE — Telephone Encounter (Signed)
She may use her Bevespi inhaler and then switch to [employer] so she does not lose the medication.

## 2019-05-05 NOTE — Telephone Encounter (Signed)
Dr. Gonzalez please advise. Thanks 

## 2019-05-06 DIAGNOSIS — M5416 Radiculopathy, lumbar region: Secondary | ICD-10-CM | POA: Diagnosis not present

## 2019-05-06 DIAGNOSIS — M5136 Other intervertebral disc degeneration, lumbar region: Secondary | ICD-10-CM | POA: Diagnosis not present

## 2019-05-09 DIAGNOSIS — R569 Unspecified convulsions: Secondary | ICD-10-CM | POA: Diagnosis not present

## 2019-05-09 DIAGNOSIS — G4733 Obstructive sleep apnea (adult) (pediatric): Secondary | ICD-10-CM | POA: Diagnosis not present

## 2019-05-10 DIAGNOSIS — L720 Epidermal cyst: Secondary | ICD-10-CM | POA: Diagnosis not present

## 2019-05-10 DIAGNOSIS — L814 Other melanin hyperpigmentation: Secondary | ICD-10-CM | POA: Diagnosis not present

## 2019-05-10 DIAGNOSIS — L738 Other specified follicular disorders: Secondary | ICD-10-CM | POA: Diagnosis not present

## 2019-05-26 ENCOUNTER — Ambulatory Visit: Payer: Medicare Other

## 2019-05-26 LAB — CUP PACEART REMOTE DEVICE CHECK
Battery Remaining Longevity: 5 mo
Battery Remaining Percentage: 7 %
Brady Statistic RA Percent Paced: 6 %
Brady Statistic RV Percent Paced: 100 %
Date Time Interrogation Session: 20210401041100
Implantable Lead Implant Date: 20130830
Implantable Lead Implant Date: 20130830
Implantable Lead Location: 753859
Implantable Lead Location: 753860
Implantable Lead Model: 4456
Implantable Lead Model: 4479
Implantable Lead Serial Number: 473325
Implantable Lead Serial Number: 523784
Implantable Pulse Generator Implant Date: 20130830
Lead Channel Impedance Value: 429 Ohm
Lead Channel Impedance Value: 606 Ohm
Lead Channel Pacing Threshold Amplitude: 0.7 V
Lead Channel Pacing Threshold Pulse Width: 0.5 ms
Lead Channel Setting Pacing Amplitude: 2 V
Lead Channel Setting Pacing Amplitude: 2.4 V
Lead Channel Setting Pacing Pulse Width: 0.5 ms
Lead Channel Setting Sensing Sensitivity: 2.5 mV
Pulse Gen Serial Number: 134132

## 2019-05-31 ENCOUNTER — Other Ambulatory Visit: Payer: Self-pay | Admitting: Gastroenterology

## 2019-06-19 ENCOUNTER — Other Ambulatory Visit: Payer: Self-pay | Admitting: Cardiovascular Disease

## 2019-06-20 ENCOUNTER — Ambulatory Visit (INDEPENDENT_AMBULATORY_CARE_PROVIDER_SITE_OTHER): Payer: Medicare Other | Admitting: Pulmonary Disease

## 2019-06-20 ENCOUNTER — Encounter: Payer: Self-pay | Admitting: Pulmonary Disease

## 2019-06-20 ENCOUNTER — Other Ambulatory Visit: Payer: Self-pay

## 2019-06-20 VITALS — BP 116/70 | HR 61 | Ht 68.5 in | Wt 194.0 lb

## 2019-06-20 DIAGNOSIS — J439 Emphysema, unspecified: Secondary | ICD-10-CM

## 2019-06-20 DIAGNOSIS — I25118 Atherosclerotic heart disease of native coronary artery with other forms of angina pectoris: Secondary | ICD-10-CM | POA: Insufficient documentation

## 2019-06-20 DIAGNOSIS — R0602 Shortness of breath: Secondary | ICD-10-CM | POA: Diagnosis not present

## 2019-06-20 DIAGNOSIS — I2510 Atherosclerotic heart disease of native coronary artery without angina pectoris: Secondary | ICD-10-CM | POA: Insufficient documentation

## 2019-06-20 DIAGNOSIS — G4736 Sleep related hypoventilation in conditions classified elsewhere: Secondary | ICD-10-CM

## 2019-06-20 NOTE — Patient Instructions (Signed)
You did well on your ambulatory oximetry today (walk test)  We are going to get an echocardiogram and a CT directed to the heart arteries to evaluate your heart as this may be causing some of your symptoms of shortness of breath  Continue Stiolto for now  We are going to check your oxygen level on your current oxygen supplementation at nighttime  We will see you in follow-up in 3 months time call sooner should any new problems arise

## 2019-06-20 NOTE — Progress Notes (Addendum)
Subjective:    Patient ID: Diana Cook, female    DOB: [birth year], 69 y.o.   MRN: [id number]  HPI Patient is a 69 year old former smoker (quit 2012) who presents for follow-up on COPD and dyspnea. The patient's COPD is on the basis of emphysema, she has a well-preserved FEV1. At our prior visit on 01-27-2019 she was given a trial of Anoro Ellipta. This was not covered by her insurance so it was switched to [employer] 2 inhalations daily. This is basically LABA/LAMA combination. She uses nocturnal oxygen but does well during the day. She presents with increasing dyspnea that is out of proportion to her pulmonary function determination. She is status post pacemaker placement in the past. She follows with Dr. Caryl Comes for this. She was recently started on Lipitor for "early blockages" in her coronary arteries noted on cardiac scoring CT. She has been compliant with her nocturnal oxygen at 2 L/min.  I have reviewed her available studies. She had a coronary CT noted on 12-16-2018 that showed right dominant coronary arteries, LAD and left circumflex arising from a common orifice in the left coronary cusp. Right coronary with a mix section of calcified and noncalcified plaque at 50 to 69% stenosis though over 70% stenosis could not be excluded. She apparently had a follow-up study on 01-18-2019 but unfortunately the right side could not be evaluated due to motion artifact. LAD and circumflex appeared to have no significant plaquing.  She has had no fevers, chills or sweats. No sputum production. No hemoptysis. She has not had any orthopnea or paroxysmal nocturnal dyspnea. She does have significant dyspnea even with activities of daily living.   Review of Systems A 10 point review of systems was performed and it is as noted above otherwise negative.    Objective:   Physical Exam BP 116/70 (BP Location: Left Arm, Cuff Size: Normal)   Pulse 61   Ht 5' 8.5" (1.74 m)   Wt 194 lb (88 kg)   SpO2 96%   BMI 29.07  kg/m  GENERAL: Well-developed well-nourished woman in no acute respiratory distress.  She is fully ambulatory. HEAD: Normocephalic, atraumatic.  EYES: Pupils equal, round, reactive to light.  No scleral icterus.  MOUTH: Nose/mouth/throat not examined due to masking requirements for COVID 19. NECK: Supple. No thyromegaly. No nodules. No JVD.  Trachea midline. PULMONARY: Symmetrical air entry, clear with no adventitious sounds CARDIOVASCULAR: S1 and S2. Regular rate and rhythm. No rubs murmurs gallops heard. GASTROINTESTINAL: Nondistended. MUSCULOSKELETAL: No joint deformity, no clubbing, no edema.  NEUROLOGIC: Awake, alert, oriented x4, no focal deficits. SKIN: Intact,warm,dry, on limited exam no rashes. PSYCH: Normal mood and behavior.  He underwent ambulatory oximetry today that showed no evidence of oxygen desaturations. She had significant dyspnea however.    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 Rockey Situ Continue Stiolto Continue albuterol as needed Continue nocturnal oxygen supplementation Follow-up in 3 months time call sooner should any new difficulties arise  Coronary arterosclerosis Referral to cardiology as above Plan was discussed with patient Continue Lipitor Patient states that she has been told not to take aspirin previously   C. Derrill Kay, MD [facility]   *This note was dictated using voice recognition software/Dragon.  Despite best efforts to  proofread, errors can occur which can change the meaning.  Any change was purely unintentional.

## 2019-06-20 NOTE — Addendum Note (Signed)
Addended by: Tyler Pita on: 06/20/2019 03:02 PM   Modules accepted: Orders

## 2019-06-22 ENCOUNTER — Ambulatory Visit (INDEPENDENT_AMBULATORY_CARE_PROVIDER_SITE_OTHER): Payer: Medicare Other | Admitting: Internal Medicine

## 2019-06-22 ENCOUNTER — Other Ambulatory Visit: Payer: Self-pay

## 2019-06-22 ENCOUNTER — Encounter: Payer: Self-pay | Admitting: Internal Medicine

## 2019-06-22 VITALS — BP 110/72 | HR 78 | Ht 68.0 in | Wt 193.5 lb

## 2019-06-22 DIAGNOSIS — R0600 Dyspnea, unspecified: Secondary | ICD-10-CM | POA: Diagnosis not present

## 2019-06-22 DIAGNOSIS — I2510 Atherosclerotic heart disease of native coronary artery without angina pectoris: Secondary | ICD-10-CM

## 2019-06-22 DIAGNOSIS — I442 Atrioventricular block, complete: Secondary | ICD-10-CM | POA: Diagnosis not present

## 2019-06-22 DIAGNOSIS — R0609 Other forms of dyspnea: Secondary | ICD-10-CM

## 2019-06-22 DIAGNOSIS — R0789 Other chest pain: Secondary | ICD-10-CM | POA: Diagnosis not present

## 2019-06-22 MED ORDER — ASPIRIN EC 81 MG PO TBEC
81.0000 mg | DELAYED_RELEASE_TABLET | Freq: Every day | ORAL | 3 refills | Status: DC
Start: 2019-06-22 — End: 2019-11-16

## 2019-06-22 MED ORDER — ISOSORBIDE MONONITRATE ER 30 MG PO TB24
15.0000 mg | ORAL_TABLET | Freq: Every day | ORAL | 2 refills | Status: DC
Start: 2019-06-22 — End: 2019-07-19

## 2019-06-22 NOTE — Progress Notes (Signed)
New Outpatient Visit Date: 06/22/2019  Referring Provider: Tyler Pita, MD [address] [place],  [place] [address]  Chief Complaint: Shortness of breath  HPI:  Diana Cook is a 69 y.o. female who is being seen today for the evaluation of worsening shortness of breath at the request of Dr. Patsey Berthold. She has a history of moderate coronary artery disease by cardiac CTA in 11/2018, complete heart block status post permanent pacemaker followed by Dr. Caryl Comes, seizure disorder, COPD, and right breast cancer.  She was evaluated 2 days ago by Dr. Patsey Berthold and complained of worsening shortness of breath.  Given history of at least moderate, nonobstructive CAD on cardiac CTA 5 months ago, the patient was referred to me for urgent evaluation.  Dr. Caryl Comes had previously added atorvastatin after aforementioned CTA revealed CAD.  Today, Diana Cook reports that her exertional dyspnea has been progressively worsening for months.  She now gets out of breath climbing 7-8 steps.  She also notes occasional chest pain, which she has attributed to her history of breast cancer and right mastectomy.  The pain often begins in the right upper back and shoots through to the right breast.  It is random and not exertional and typically resolves within about 30 minutes of taking acetaminophen.  It is hard for her to say if the pain has worsened or improved following breast surgery 3 years ago.  Diana Cook notes that she had a "heart attack" leading up to her pacemaker placement while vacationing in [facility] several years ago.  She does not believe that she underwent catheterization or PCI at that time.  She has not had any orthopnea, PND, or edema.  --------------------------------------------------------------------------------------------------  Past Medical History:  Diagnosis Date  . Allergic rhinitis   . Arthritis   . Asthma    as a child, mild now  . Breast cancer ([place]) 12/2015   right breast  cancer, lumpectomy and mammosite   . Colon polyps    colonoscopy 7/08, tubular adenoma  . Complete heart block ([place]) 8/13   s/p PPM implanted in [facility]  . COPD (chronic obstructive pulmonary disease) ([place])   . Myocardial infarction ([place]) 2011  . Osteopenia 10/2015  . Pacemaker    2011  . Personal history of radiation therapy 2017   right breast ca, mammosite placed  . Seizure disorder ([place])   . Seizures ([place])    first one was when she was 69 years old   . Small bowel obstruction ([place])    1988 and 2002  . Tobacco abuse     Past Surgical History:  Procedure Laterality Date  . ABDOMINAL HYSTERECTOMY    . BREAST BIOPSY Right 2007   benign inflammatory changes, mass due to underwire bra  . BREAST BIOPSY Left 01/02/2016   columnar cell changes without atypical hyperplasia.  Marland Kitchen BREAST BIOPSY Right 12/06/2015   rt breast mass 10:00, bx done at Dr. Curly [place] office, invasive ductal carcinoma  . BREAST EXCISIONAL BIOPSY Left 01/02/2016   COLUMNAR CELL CHANGE AND HYPERPLASIA ASSOCIATED WITH LUMINAL AND STROMAL CALCIFICATIONS  . BREAST LUMPECTOMY Right 01/02/2016   invasive mammary carcinoma, clear margins, negative LN  . BREAST LUMPECTOMY WITH SENTINEL LYMPH NODE BIOPSY Right 01/02/2016   pT1c, N0; ER/ PR 100%; Her 2 neu not over expressed: BREAST LUMPECTOMY WITH SENTINEL LYMPH NODE BX;  Surgeon: [name], MD;  Location: [facility] ORS;  Service: General;  Laterality: Right;  . CATARACT EXTRACTION Bilateral   .  COLONOSCOPY  10/2015   Dr Ardis Hughs  . COLONOSCOPY WITH PROPOFOL N/A 08/19/2018   Procedure: COLONOSCOPY WITH PROPOFOL;  Surgeon: Virgel Manifold, MD;  Location: [facility] ENDOSCOPY;  Service: Endoscopy;  Laterality: N/A;  . ESOPHAGOGASTRODUODENOSCOPY (EGD) WITH PROPOFOL N/A 08/19/2018   Procedure: ESOPHAGOGASTRODUODENOSCOPY (EGD) WITH PROPOFOL;  Surgeon: Virgel Manifold, MD;  Location: [facility] ENDOSCOPY;  Service: Endoscopy;  Laterality: N/A;  . EXPLORATORY LAPAROTOMY   01/25/2001   Exploratory laparotomy, lysis of adhesions, identification of internal hernia secondary to omental adhesion. Prolonged postoperative ileus.  Marland Kitchen gyn surgery  1993   hysterectomy- form endometriosis  . LAPAROSCOPY    . PACEMAKER INSERTION  10/24/11   [employer] Advantio dual chamber PPM implanted by Dr Bunnie Philips at [facility] in [place]    Current Meds  Medication Sig  . acetaminophen (TYLENOL) 500 MG tablet Take 500 mg by mouth every 6 (six) hours as needed.  Marland Kitchen albuterol (VENTOLIN HFA) 108 (90 Base) MCG/ACT inhaler Inhale 2 puffs into the lungs every 4 (four) hours as needed for wheezing or shortness of breath.  Marland Kitchen atorvastatin (LIPITOR) 40 MG tablet Take 1 tablet (40 mg total) by mouth daily.  . Calcium-Vitamin D-Vitamin K 326-712-45 MG-UNT-MCG TABS Take 1 tablet by mouth 2 (two) times daily.   . famotidine (PEPCID) 20 MG tablet Take 1 tablet (20 mg total) by mouth daily. **PLEASE SCHEDULE A FOLLOW UP APPT**  . FLUoxetine (PROZAC) 20 MG capsule Take 1 capsule (20 mg total) by mouth daily.  . furosemide (LASIX) 20 MG tablet Take 1 tablet (20 mg total) by mouth daily as needed.  . lamoTRIgine (LAMICTAL) 100 MG tablet Take 1 tablet by mouth 2 (two) times daily.  . OXYGEN Inhale into the lungs. At nighttime  . Tiotropium Bromide-Olodaterol (STIOLTO RESPIMAT) 2.5-2.5 MCG/ACT AERS Inhale 2 puffs into the lungs daily.  Marland Kitchen zolpidem (AMBIEN) 10 MG tablet TAKE 1/2 TO 1 (ONE-HALF TO ONE) TABLET BY MOUTH AT BEDTIME AS NEEDED FOR SLEEP    Allergies: Codeine and Morphine and related  Social History   Tobacco Use  . Smoking status: Former Smoker    Packs/day: 1.00    Years: 42.00    Pack years: 42.00    Types: Cigarettes    Quit date: 11/11/2010    Years since quitting: 8.6  . Smokeless tobacco: Never Used  Substance Use Topics  . Alcohol use: Yes    Alcohol/week: 4.0 standard drinks    Types: 4 Glasses of wine per week    Comment: weekly  . Drug use: No     Family History  Problem Relation Age of Onset  . Stroke Mother   . Heart disease Mother 57       MI and CABG  . Dementia Mother   . Coronary artery disease Father   . Parkinsonism Father   . Cancer - Cervical Daughter 72       died April 01, 2022  . Heart attack Brother 41  . Colon cancer Neg Hx   . Breast cancer Neg Hx     Review of Systems: A 12-system review of systems was performed and was negative except as noted in the HPI.  --------------------------------------------------------------------------------------------------  Physical Exam: BP 110/72 (BP Location: Right Arm, Patient Position: Sitting, Cuff Size: Normal)   Pulse 78   Ht 5\' 8"  (1.727 m)   Wt 193 lb 8 oz (87.8 kg)   SpO2 (!) 89%   BMI 29.42 kg/m   Repeat SpO2: 94% on room  air  General: NAD.  Accompanied by her husband. HEENT: No conjunctival pallor or scleral icterus. Facemask in place. Neck: Supple without lymphadenopathy, thyromegaly, JVD, or HJR. No carotid bruit. Lungs: Normal work of breathing.  Moderately decreased breath sounds in both lung fields.  No wheezes or crackles. Heart: Regular rate and rhythm without murmurs, rubs, or gallops. Non-displaced PMI. Abd: Bowel sounds present. Soft, NT/ND without hepatosplenomegaly Ext: No lower extremity edema. Radial, PT, and DP pulses are 2+ bilaterally Skin: Warm and dry without rash. Neuro: CNIII-XII intact. Strength and fine-touch sensation intact in upper and lower extremities bilaterally. Psych: Normal mood and affect.  EKG: Normal sinus rhythm with atrially sensed and ventricularly paced rhythm.  Lab Results  Component Value Date   WBC 5.7 12/28/2018   HGB 13.0 12/28/2018   HCT 39.8 12/28/2018   MCV 91.2 12/28/2018   PLT 284.0 12/28/2018    Lab Results  Component Value Date   NA 137 12/28/2018   K 5.3 (H) 12/28/2018   CL 103 12/28/2018   CO2 26 12/28/2018   BUN 21 12/28/2018   CREATININE 0.84 12/28/2018   GLUCOSE 85 12/28/2018   ALT 19  12/28/2018    Lab Results  Component Value Date   CHOL 205 (H) 12/28/2018   HDL 63.30 12/28/2018   LDLCALC 124 (H) 12/28/2018   TRIG 90.0 12/28/2018   CHOLHDL 3 12/28/2018   --------------------------------------------------------------------------------------------------  ASSESSMENT AND PLAN: Coronary artery disease with dyspnea on exertion and atypical chest pain: I suspect that Ms. Armwood's chronic progressive dyspnea is multifactorial.  Certainly, her underlying lung disease is contributing.  It would be unusual for coronary disease alone to lead to significant hypoxia requiring supplemental oxygen.  However, CTA of the chest last fall showed multivessel CAD.  The LAD and LCx disease was not hemodynamically significant by CT FFR.  However, RCA was suboptimally evaluated due to significant motion artifact.  Ms. Heyboer has already been scheduled for an echocardiogram by Dr. Patsey Berthold next month.  We have agreed to follow-up the results of this before we pursue additional testing.  We could consider a pharmacologic MPI to evaluate for significant ischemia versus proceed with cardiac catheterization.  I will reach out to Dr. Caryl Comes when he is back in the office regarding his thoughts.  In the meantime, we have agreed to an empiric trial of isosorbide mononitrate 15 mg daily to see if this helps some of her symptoms.  I also asked Ms. Turnley to begin taking aspirin 81 mg daily.  Continue atorvastatin 40 mg daily for target LDL less than 70.  Complete heart block status post permanent pacemaker: EKG today shows sinus rhythm with atrial sensing and ventricular pacing.  No evidence of device malfunction.  Continue follow-up with Dr. Caryl Comes.  Follow-up: Return to clinic in 1 month.  Nelva Bush, MD 06/23/2019 4:03 PM

## 2019-06-22 NOTE — Patient Instructions (Addendum)
Medication Instructions:  Your physician has recommended you make the following change in your medication:  1- START Imdur 15 mg (0.5 tablet) by mouth once a day. 2- START Aspirin 81 mg by mouth once a day.  *If you need a refill on your cardiac medications before your next appointment, please call your pharmacy*  Follow-Up: At [facility], you and your health needs are our priority.  As part of our continuing mission to provide you with exceptional heart care, we have created designated Provider Care Teams.  These Care Teams include your primary Cardiologist (physician) and Advanced Practice Providers (APPs -  Physician Assistants and Nurse Practitioners) who all work together to provide you with the care you need, when you need it.  We recommend signing up for the patient portal called "MyChart".  Sign up information is provided on this After Visit Summary.  MyChart is used to connect with patients for Virtual Visits (Telemedicine).  Patients are able to view lab/test results, encounter notes, upcoming appointments, etc.  Non-urgent messages can be sent to your provider as well.   To learn more about what you can do with MyChart, go to [email or web address].    Your next appointment:   1 month(s)  The format for your next appointment:   In Person  Provider:    You may see DR Harrell Gave END or one of the following Advanced Practice Providers on your designated Care Team:    Murray Hodgkins, NP  Christell Faith, PA-C  Marrianne Mood, PA-C

## 2019-06-23 ENCOUNTER — Encounter: Payer: Self-pay | Admitting: Internal Medicine

## 2019-06-23 DIAGNOSIS — R0789 Other chest pain: Secondary | ICD-10-CM | POA: Insufficient documentation

## 2019-07-04 ENCOUNTER — Ambulatory Visit
Admission: RE | Admit: 2019-07-04 | Discharge: 2019-07-04 | Disposition: A | Discharge status: Home / Self Care | Payer: Medicare Other | Source: Ambulatory Visit | Attending: Pulmonary Disease | Admitting: Pulmonary Disease

## 2019-07-04 ENCOUNTER — Other Ambulatory Visit: Payer: Self-pay

## 2019-07-04 DIAGNOSIS — Z853 Personal history of malignant neoplasm of breast: Secondary | ICD-10-CM | POA: Insufficient documentation

## 2019-07-04 DIAGNOSIS — J449 Chronic obstructive pulmonary disease, unspecified: Secondary | ICD-10-CM | POA: Insufficient documentation

## 2019-07-04 DIAGNOSIS — I252 Old myocardial infarction: Secondary | ICD-10-CM | POA: Diagnosis not present

## 2019-07-04 DIAGNOSIS — Z950 Presence of cardiac pacemaker: Secondary | ICD-10-CM | POA: Insufficient documentation

## 2019-07-04 DIAGNOSIS — Z87891 Personal history of nicotine dependence: Secondary | ICD-10-CM | POA: Diagnosis not present

## 2019-07-04 DIAGNOSIS — I2510 Atherosclerotic heart disease of native coronary artery without angina pectoris: Secondary | ICD-10-CM

## 2019-07-04 DIAGNOSIS — G40909 Epilepsy, unspecified, not intractable, without status epilepticus: Secondary | ICD-10-CM | POA: Diagnosis not present

## 2019-07-04 DIAGNOSIS — R0602 Shortness of breath: Secondary | ICD-10-CM

## 2019-07-04 NOTE — Progress Notes (Signed)
*  PRELIMINARY RESULTS* Echocardiogram 2D Echocardiogram has been performed.  Diana Cook 07/04/2019, 11:33 AM

## 2019-07-06 NOTE — Telephone Encounter (Signed)
Echocardiogram showed normal function however there was one area that could not be evaluated well.  This has been the issue with the noninvasive testing.  She has been referred to cardiology and they can follow-up on these issues.

## 2019-07-07 ENCOUNTER — Ambulatory Visit (INDEPENDENT_AMBULATORY_CARE_PROVIDER_SITE_OTHER): Payer: Medicare Other | Admitting: *Deleted

## 2019-07-07 DIAGNOSIS — I442 Atrioventricular block, complete: Secondary | ICD-10-CM

## 2019-07-07 LAB — CUP PACEART REMOTE DEVICE CHECK
Battery Remaining Longevity: 3 mo
Battery Remaining Percentage: 4 %
Brady Statistic RA Percent Paced: 6 %
Brady Statistic RV Percent Paced: 100 %
Date Time Interrogation Session: 20210513091600
Implantable Lead Implant Date: 20130830
Implantable Lead Implant Date: 20130830
Implantable Lead Location: 753859
Implantable Lead Location: 753860
Implantable Lead Model: 4456
Implantable Lead Model: 4479
Implantable Lead Serial Number: 473325
Implantable Lead Serial Number: 523784
Implantable Pulse Generator Implant Date: 20130830
Lead Channel Impedance Value: 494 Ohm
Lead Channel Impedance Value: 619 Ohm
Lead Channel Pacing Threshold Amplitude: 0.7 V
Lead Channel Pacing Threshold Pulse Width: 0.5 ms
Lead Channel Setting Pacing Amplitude: 2 V
Lead Channel Setting Pacing Amplitude: 2.4 V
Lead Channel Setting Pacing Pulse Width: 0.5 ms
Lead Channel Setting Sensing Sensitivity: 2.5 mV
Pulse Gen Serial Number: 134132

## 2019-07-08 ENCOUNTER — Ambulatory Visit (INDEPENDENT_AMBULATORY_CARE_PROVIDER_SITE_OTHER): Payer: Medicare Other | Admitting: Internal Medicine

## 2019-07-08 ENCOUNTER — Other Ambulatory Visit
Admission: RE | Admit: 2019-07-08 | Discharge: 2019-07-08 | Disposition: A | Discharge status: Home / Self Care | Payer: Medicare Other | Source: Ambulatory Visit | Attending: Internal Medicine | Admitting: Internal Medicine

## 2019-07-08 ENCOUNTER — Encounter: Payer: Self-pay | Admitting: Internal Medicine

## 2019-07-08 ENCOUNTER — Other Ambulatory Visit: Payer: Self-pay

## 2019-07-08 VITALS — BP 110/68 | HR 69 | Ht 68.0 in | Wt 194.4 lb

## 2019-07-08 DIAGNOSIS — R0789 Other chest pain: Secondary | ICD-10-CM | POA: Diagnosis not present

## 2019-07-08 DIAGNOSIS — J449 Chronic obstructive pulmonary disease, unspecified: Secondary | ICD-10-CM

## 2019-07-08 DIAGNOSIS — R0602 Shortness of breath: Secondary | ICD-10-CM | POA: Insufficient documentation

## 2019-07-08 DIAGNOSIS — R0600 Dyspnea, unspecified: Secondary | ICD-10-CM | POA: Diagnosis not present

## 2019-07-08 DIAGNOSIS — R079 Chest pain, unspecified: Secondary | ICD-10-CM | POA: Diagnosis not present

## 2019-07-08 DIAGNOSIS — I442 Atrioventricular block, complete: Secondary | ICD-10-CM

## 2019-07-08 DIAGNOSIS — R0609 Other forms of dyspnea: Secondary | ICD-10-CM

## 2019-07-08 DIAGNOSIS — I2510 Atherosclerotic heart disease of native coronary artery without angina pectoris: Secondary | ICD-10-CM

## 2019-07-08 LAB — CBC WITH DIFFERENTIAL/PLATELET
Abs Immature Granulocytes: 0.04 10*3/uL (ref 0.00–0.07)
Basophils Absolute: 0.1 10*3/uL (ref 0.0–0.1)
Basophils Relative: 1 %
Eosinophils Absolute: 0.3 10*3/uL (ref 0.0–0.5)
Eosinophils Relative: 4 %
HCT: 42.1 % (ref 36.0–46.0)
Hemoglobin: 13.7 g/dL (ref 12.0–15.0)
Immature Granulocytes: 1 %
Lymphocytes Relative: 23 %
Lymphs Abs: 1.4 10*3/uL (ref 0.7–4.0)
MCH: 29.3 pg (ref 26.0–34.0)
MCHC: 32.5 g/dL (ref 30.0–36.0)
MCV: 90 fL (ref 80.0–100.0)
Monocytes Absolute: 0.6 10*3/uL (ref 0.1–1.0)
Monocytes Relative: 10 %
Neutro Abs: 3.8 10*3/uL (ref 1.7–7.7)
Neutrophils Relative %: 61 %
Platelets: 276 10*3/uL (ref 150–400)
RBC: 4.68 MIL/uL (ref 3.87–5.11)
RDW: 13.2 % (ref 11.5–15.5)
WBC: 6.1 10*3/uL (ref 4.0–10.5)
nRBC: 0 % (ref 0.0–0.2)

## 2019-07-08 LAB — BASIC METABOLIC PANEL
Anion gap: 8 (ref 5–15)
BUN: 28 mg/dL — ABNORMAL HIGH (ref 8–23)
CO2: 26 mmol/L (ref 22–32)
Calcium: 9.8 mg/dL (ref 8.9–10.3)
Chloride: 105 mmol/L (ref 98–111)
Creatinine, Ser: 0.89 mg/dL (ref 0.44–1.00)
GFR calc Af Amer: >60 mL/min (ref >60)
GFR calc non Af Amer: >60 mL/min (ref >60)
Glucose, Bld: 94 mg/dL (ref 70–99)
Potassium: 3.8 mmol/L (ref 3.5–5.1)
Sodium: 139 mmol/L (ref 135–145)

## 2019-07-08 NOTE — Patient Instructions (Addendum)
Medication Instructions:  Your physician recommends that you continue on your current medications as directed. Please refer to the Current Medication list given to you today.  *If you need a refill on your cardiac medications before your next appointment, please call your pharmacy*   Lab Work: Please have bmet and cbc today at the [facility] medical mall.  Please stop at the desk upstairs.  You will need a COVID test on 07/18/19. Please report to the [facility] medical arts drive up test site between 8am-1pm.  If you have labs (blood work) drawn today and your tests are completely normal, you will receive your results only by: Marland Kitchen MyChart Message (if you have MyChart) OR . A paper copy in the mail If you have any lab test that is abnormal or we need to change your treatment, we will call you to review the results.   Testing/Procedures: Your physician has requested that you have a cardiac catheterization. Cardiac catheterization is used to diagnose and/or treat various heart conditions. Doctors may recommend this procedure for a number of different reasons. The most common reason is to evaluate chest pain. Chest pain can be a symptom of coronary artery disease (CAD), and cardiac catheterization can show whether plaque is narrowing or blocking your heart's arteries. This procedure is also used to evaluate the valves, as well as measure the blood flow and oxygen levels in different parts of your heart. For further information please visit [email or web address]. Please follow instruction sheet, as given.     Follow-Up: At [facility], you and your health needs are our priority.  As part of our continuing mission to provide you with exceptional heart care, we have created designated Provider Care Teams.  These Care Teams include your primary Cardiologist (physician) and Advanced Practice Providers (APPs -  Physician Assistants and Nurse Practitioners) who all work together to provide you with the care you  need, when you need it.  We recommend signing up for the patient portal called "MyChart".  Sign up information is provided on this After Visit Summary.  MyChart is used to connect with patients for Virtual Visits (Telemedicine).  Patients are able to view lab/test results, encounter notes, upcoming appointments, etc.  Non-urgent messages can be sent to your provider as well.   To learn more about what you can do with MyChart, go to [email or web address].    Your next appointment:   5 week(s)  The format for your next appointment:   In Person  Provider:    You may see  Dr. Saunders Revel or one of the following Advanced Practice Providers on your designated Care Team:    Murray Hodgkins, NP  Christell Faith, PA-C  Marrianne Mood, PA-C    Other Instructions [facility] Cardiac Cath Instructions   You are scheduled for a Cardiac Cath on: 07/19/19 @ 9:30 am with Dr. Saunders Revel  Please arrive at 8:30 am on the day of your procedure  Please expect a call from our [facility] to pre-register you  Do not eat/drink anything after midnight  Someone will need to drive you home  It is recommended someone be with you for the first 24 hours after your procedure  Wear clothes that are easy to get on/off and wear slip on shoes if possible   Medications bring a current list of all medications with you   _X__ Do not take these medications before your procedure:_HOLD Lasix the morning of the procedure   You may take all of your other medications  the morning of your procedure with enough water to swallow safely    Day of your procedure: Arrive at the [facility] entrance.  Free valet service is available.  After entering the [facility] please check-in at the registration desk (1st desk on your right) to receive your armband. After receiving your armband someone will escort you to the cardiac cath/special procedures waiting area.  The usual length of stay after your procedure is about 2  to 3 hours.  This can vary.  If you have any questions, please call our office at [phone], or you may call the cardiac cath lab at [facility] directly at [phone]

## 2019-07-08 NOTE — Progress Notes (Signed)
Follow-up Outpatient Visit Date: 07/08/2019  Primary Care Provider: Abner Greenspan, MD [place] [address]  Chief Complaint: Shortness of breath and chest pain  HPI:  Diana Cook is a 69 y.o. female with history of moderate coronary artery disease by cardiac CTA in 11/2018, complete heart block status post permanent pacemaker followed by Dr. Caryl Comes, seizure disorder, COPD, and right breast cancer, who presents for follow-up of shortness of breath.  I met her two weeks ago for urgent evaluation of progressive shortness of breath with minimal activity as well as atypical chest pain.  She had undergone cardiac CTA last year which showed moderate LAD and LCx disease that was not hemodynamically significant by CT FFR.  There was suggestion of possible moderate to severe disease involving the RCA, but further assessment could not be performed due to significant motion artifact.  Echocardiogram earlier this week showed normal LVEF (55-60%) with grade 1 diastolic dysfunction.  Pulmonary artery pressure was normal.  No significant valvular abnormality was identified.  Today, Diana Cook reports feeling similar to our last visit.  She has stable exertional dyspnea and also continues to have intermittent pain in her chest.  It seems to be more left-sided now.  She wonders if it is related to her "heart attack" that she suffered several years ago and ultimately led to pacemaker placement.  She has occasional swelling in her hands, which has been longstanding.  She feels like isosorbide mononitrate may have helped slightly in regard to her breathing and chest pain, though it is hard to tell.  She has experienced intermittent headaches with this, however.  --------------------------------------------------------------------------------------------------  Past Medical History:  Diagnosis Date  . Allergic rhinitis   . Arthritis   . Asthma    as a child, mild now  . Breast cancer ([place])  12/2015   right breast cancer, lumpectomy and mammosite   . Colon polyps    colonoscopy 7/08, tubular adenoma  . Complete heart block ([place]) 8/13   s/p PPM implanted in [facility]  . COPD (chronic obstructive pulmonary disease) ([place])   . Myocardial infarction ([place]) 2011  . Osteopenia 10/2015  . Pacemaker    2011  . Personal history of radiation therapy 2017   right breast ca, mammosite placed  . Seizure disorder ([place])   . Seizures ([place])    first one was when she was 69 years old   . Small bowel obstruction ([place])    1988 and 2002  . Tobacco abuse    Past Surgical History:  Procedure Laterality Date  . ABDOMINAL HYSTERECTOMY    . BREAST BIOPSY Right 2007   benign inflammatory changes, mass due to underwire bra  . BREAST BIOPSY Left 01/02/2016   columnar cell changes without atypical hyperplasia.  Marland Kitchen BREAST BIOPSY Right 12/06/2015   rt breast mass 10:00, bx done at Dr. Curly [place] office, invasive ductal carcinoma  . BREAST EXCISIONAL BIOPSY Left 01/02/2016   COLUMNAR CELL CHANGE AND HYPERPLASIA ASSOCIATED WITH LUMINAL AND STROMAL CALCIFICATIONS  . BREAST LUMPECTOMY Right 01/02/2016   invasive mammary carcinoma, clear margins, negative LN  . BREAST LUMPECTOMY WITH SENTINEL LYMPH NODE BIOPSY Right 01/02/2016   pT1c, N0; ER/ PR 100%; Her 2 neu not over expressed: BREAST LUMPECTOMY WITH SENTINEL LYMPH NODE BX;  Surgeon: [name], MD;  Location: [facility] ORS;  Service: General;  Laterality: Right;  . CATARACT EXTRACTION Bilateral   . COLONOSCOPY  10/2015   Dr Ardis Hughs  . COLONOSCOPY  WITH PROPOFOL N/A 08/19/2018   Procedure: COLONOSCOPY WITH PROPOFOL;  Surgeon: Tahiliani, Varnita B, MD;  Location: [facility] ENDOSCOPY;  Service: Endoscopy;  Laterality: N/A;  . ESOPHAGOGASTRODUODENOSCOPY (EGD) WITH PROPOFOL N/A 08/19/2018   Procedure: ESOPHAGOGASTRODUODENOSCOPY (EGD) WITH PROPOFOL;  Surgeon: Tahiliani, Varnita B, MD;  Location: [facility] ENDOSCOPY;  Service: Endoscopy;  Laterality: N/A;  .  EXPLORATORY LAPAROTOMY  01/25/2001   Exploratory laparotomy, lysis of adhesions, identification of internal hernia secondary to omental adhesion. Prolonged postoperative ileus.  . gyn surgery  1993   hysterectomy- form endometriosis  . LAPAROSCOPY    . PACEMAKER INSERTION  10/24/11   [employer] Advantio dual chamber PPM implanted by Dr Leask at [facility] in [place]    Current Meds  Medication Sig  . acetaminophen (TYLENOL) 500 MG tablet Take 500 mg by mouth every 6 (six) hours as needed.  . albuterol (VENTOLIN HFA) 108 (90 Base) MCG/ACT inhaler Inhale 2 puffs into the lungs every 4 (four) hours as needed for wheezing or shortness of breath.  . aspirin EC 81 MG tablet Take 1 tablet (81 mg total) by mouth daily.  . atorvastatin (LIPITOR) 40 MG tablet Take 1 tablet (40 mg total) by mouth daily.  . Calcium-Vitamin D-Vitamin K 750-500-40 MG-UNT-MCG TABS Take 1 tablet by mouth 2 (two) times daily.   . famotidine (PEPCID) 20 MG tablet Take 1 tablet (20 mg total) by mouth daily. **PLEASE SCHEDULE A FOLLOW UP APPT**  . FLUoxetine (PROZAC) 20 MG capsule Take 1 capsule (20 mg total) by mouth daily.  . furosemide (LASIX) 20 MG tablet Take 1 tablet (20 mg total) by mouth daily as needed.  . isosorbide mononitrate (IMDUR) 30 MG 24 hr tablet Take 0.5 tablets (15 mg total) by mouth daily.  . lamoTRIgine (LAMICTAL) 100 MG tablet Take 1 tablet by mouth daily.   . OXYGEN Inhale into the lungs. At nighttime  . Tiotropium Bromide-Olodaterol (STIOLTO RESPIMAT) 2.5-2.5 MCG/ACT AERS Inhale 2 puffs into the lungs daily.  . zolpidem (AMBIEN) 10 MG tablet TAKE 1/2 TO 1 (ONE-HALF TO ONE) TABLET BY MOUTH AT BEDTIME AS NEEDED FOR SLEEP    Allergies: Codeine and Morphine and related  Social History   Tobacco Use  . Smoking status: Former Smoker    Packs/day: 1.00    Years: 42.00    Pack years: 42.00    Types: Cigarettes    Quit date: 11/11/2010    Years since quitting: 8.6  .  Smokeless tobacco: Never Used  Substance Use Topics  . Alcohol use: Yes    Alcohol/week: 4.0 standard drinks    Types: 4 Glasses of wine per week    Comment: weekly  . Drug use: No    Family History  Problem Relation Age of Onset  . Stroke Mother   . Heart disease Mother 60       MI and CABG  . Dementia Mother   . Coronary artery disease Father   . Parkinsonism Father   . Cancer - Cervical Daughter 38       died 1/18  . Heart attack Brother 65  . Colon cancer Neg Hx   . Breast cancer Neg Hx     Review of Systems: A 12-system review of systems was performed and was negative except as noted in the HPI.  --------------------------------------------------------------------------------------------------  Physical Exam: BP 110/68 (BP Location: Left Arm, Patient Position: Sitting, Cuff Size: Normal)   Pulse 69   Ht 5' 8" (1.727 m)   Wt   194 lb 6 oz (88.2 kg)   SpO2 (!) 86%   BMI 29.55 kg/m   General: NAD.  Accompanied by her husband. Neck: No JVD or HJR. Lungs: Mild diminished breath sounds throughout without wheezes or crackles. Heart: Regular rate and rhythm without murmurs, rubs, or gallops. Abdomen: Soft, nontender, nondistended Extremities: No lower extremity edema  EKG: Atrially sensed, ventricularly paced rhythm.  Lab Results  Component Value Date   WBC 5.7 12/28/2018   HGB 13.0 12/28/2018   HCT 39.8 12/28/2018   MCV 91.2 12/28/2018   PLT 284.0 12/28/2018    Lab Results  Component Value Date   NA 137 12/28/2018   K 5.3 (H) 12/28/2018   CL 103 12/28/2018   CO2 26 12/28/2018   BUN 21 12/28/2018   CREATININE 0.84 12/28/2018   GLUCOSE 85 12/28/2018   ALT 19 12/28/2018    Lab Results  Component Value Date   CHOL 205 (H) 12/28/2018   HDL 63.30 12/28/2018   LDLCALC 124 (H) 12/28/2018   TRIG 90.0 12/28/2018   CHOLHDL 3 12/28/2018    --------------------------------------------------------------------------------------------------  ASSESSMENT AND  PLAN: Coronary artery disease, dyspnea on exertion, and atypical chest pain: Symptoms have been a longstanding with only minimal improvement with addition of isosorbide mononitrate.  Due to continued intermittent headaches, we are unable to escalate dose at this time.  Echocardiogram did not reveal a clear etiology for her dyspnea and chest pain.  The question still remains as to whether she has significant RCA disease, as motion artifact limited evaluation of this vessel.  We discussed noninvasive and invasive testing options.  I have recommended that we proceed with right and left heart catheterization and possible PCI, which Diana Cook is in agreement with.  I have reviewed the risks, indications, and alternatives to cardiac catheterization, possible angioplasty, and stenting with the patient. Risks include but are not limited to bleeding, infection, vascular injury, stroke, myocardial infection, arrhythmia, kidney injury, radiation-related injury in the case of prolonged fluoroscopy use, emergency cardiac surgery, and death. The patient understands the risks of serious complication is 1-2 in 4076 with diagnostic cardiac cath and 1-2% or less with angioplasty/stenting.  We will defer medication changes at this time.  COPD: Likely driving her dyspnea and hypoxia as coronary artery disease alone does not typically lead to significant hypoxia.  It would be helpful to better assess her intracardiac pressures and also exclude intracardiac shunting; hence we will proceed with right and left heart catheterization with shunt run at the patient's convenience.  Complete heart block status post pacemaker: Appropriate device function.  Continue follow-up with EP.  Follow-up: Return to clinic in 2 to 3 weeks after catheterization.  Nelva Bush, MD 07/08/2019 3:24 PM

## 2019-07-08 NOTE — H&P (View-Only) (Signed)
Follow-up Outpatient Visit Date: 07/08/2019  Primary Care Provider: Abner Greenspan, MD [place] [address]  Chief Complaint: Shortness of breath and chest pain  HPI:  Diana Cook is a 69 y.o. female with history of moderate coronary artery disease by cardiac CTA in 11/2018, complete heart block status post permanent pacemaker followed by Dr. Caryl Comes, seizure disorder, COPD, and right breast cancer, who presents for follow-up of shortness of breath.  I met her two weeks ago for urgent evaluation of progressive shortness of breath with minimal activity as well as atypical chest pain.  She had undergone cardiac CTA last year which showed moderate LAD and LCx disease that was not hemodynamically significant by CT FFR.  There was suggestion of possible moderate to severe disease involving the RCA, but further assessment could not be performed due to significant motion artifact.  Echocardiogram earlier this week showed normal LVEF (55-60%) with grade 1 diastolic dysfunction.  Pulmonary artery pressure was normal.  No significant valvular abnormality was identified.  Today, Diana Cook reports feeling similar to our last visit.  She has stable exertional dyspnea and also continues to have intermittent pain in her chest.  It seems to be more left-sided now.  She wonders if it is related to her "heart attack" that she suffered several years ago and ultimately led to pacemaker placement.  She has occasional swelling in her hands, which has been longstanding.  She feels like isosorbide mononitrate may have helped slightly in regard to her breathing and chest pain, though it is hard to tell.  She has experienced intermittent headaches with this, however.  --------------------------------------------------------------------------------------------------  Past Medical History:  Diagnosis Date  . Allergic rhinitis   . Arthritis   . Asthma    as a child, mild now  . Breast cancer ([place])  12/2015   right breast cancer, lumpectomy and mammosite   . Colon polyps    colonoscopy 7/08, tubular adenoma  . Complete heart block ([place]) 8/13   s/p PPM [facility] in [facility]  . COPD (chronic obstructive pulmonary disease) ([place])   . Myocardial infarction ([place]) 2011  . Osteopenia 10/2015  . Pacemaker    2011  . Personal history of radiation therapy 2017   right breast ca, mammosite placed  . Seizure disorder ([place])   . Seizures ([place])    first one was when she was 69 years old   . Small bowel obstruction ([place])    1988 and 2002  . Tobacco abuse    Past Surgical History:  Procedure Laterality Date  . ABDOMINAL HYSTERECTOMY    . BREAST BIOPSY Right 2007   benign inflammatory changes, mass due to underwire bra  . BREAST BIOPSY Left 01/02/2016   columnar cell changes without atypical hyperplasia.  Marland Kitchen BREAST BIOPSY Right 12/06/2015   rt breast mass 10:00, bx done at Dr. Curly [place] office, invasive ductal carcinoma  . BREAST EXCISIONAL BIOPSY Left 01/02/2016   COLUMNAR CELL CHANGE AND HYPERPLASIA ASSOCIATED WITH LUMINAL AND STROMAL CALCIFICATIONS  . BREAST LUMPECTOMY Right 01/02/2016   invasive mammary carcinoma, clear margins, negative LN  . BREAST LUMPECTOMY WITH SENTINEL LYMPH NODE BIOPSY Right 01/02/2016   pT1c, N0; ER/ PR 100%; Her 2 neu not over expressed: BREAST LUMPECTOMY WITH SENTINEL LYMPH NODE BX;  Surgeon: [name], MD;  Location: [facility] ORS;  Service: General;  Laterality: Right;  . CATARACT EXTRACTION Bilateral   . COLONOSCOPY  10/2015   Dr Ardis Hughs  . COLONOSCOPY  WITH PROPOFOL N/A 08/19/2018   Procedure: COLONOSCOPY WITH PROPOFOL;  Surgeon: Virgel Manifold, MD;  Location: [facility] ENDOSCOPY;  Service: Endoscopy;  Laterality: N/A;  . ESOPHAGOGASTRODUODENOSCOPY (EGD) WITH PROPOFOL N/A 08/19/2018   Procedure: ESOPHAGOGASTRODUODENOSCOPY (EGD) WITH PROPOFOL;  Surgeon: Virgel Manifold, MD;  Location: [facility] ENDOSCOPY;  Service: Endoscopy;  Laterality: N/A;  .  EXPLORATORY LAPAROTOMY  01/25/2001   Exploratory laparotomy, lysis of adhesions, identification of internal hernia secondary to omental adhesion. Prolonged postoperative ileus.  Marland Kitchen gyn surgery  1993   hysterectomy- form endometriosis  . LAPAROSCOPY    . PACEMAKER INSERTION  10/24/11   [employer] Advantio dual chamber PPM implanted by Dr Bunnie Philips at [facility] in [place]    Current Meds  Medication Sig  . acetaminophen (TYLENOL) 500 MG tablet Take 500 mg by mouth every 6 (six) hours as needed.  Marland Kitchen albuterol (VENTOLIN HFA) 108 (90 Base) MCG/ACT inhaler Inhale 2 puffs into the lungs every 4 (four) hours as needed for wheezing or shortness of breath.  Marland Kitchen aspirin EC 81 MG tablet Take 1 tablet (81 mg total) by mouth daily.  Marland Kitchen atorvastatin (LIPITOR) 40 MG tablet Take 1 tablet (40 mg total) by mouth daily.  . Calcium-Vitamin D-Vitamin K 329-924-26 MG-UNT-MCG TABS Take 1 tablet by mouth 2 (two) times daily.   . famotidine (PEPCID) 20 MG tablet Take 1 tablet (20 mg total) by mouth daily. **PLEASE SCHEDULE A FOLLOW UP APPT**  . FLUoxetine (PROZAC) 20 MG capsule Take 1 capsule (20 mg total) by mouth daily.  . furosemide (LASIX) 20 MG tablet Take 1 tablet (20 mg total) by mouth daily as needed.  . isosorbide mononitrate (IMDUR) 30 MG 24 hr tablet Take 0.5 tablets (15 mg total) by mouth daily.  Marland Kitchen lamoTRIgine (LAMICTAL) 100 MG tablet Take 1 tablet by mouth daily.   . OXYGEN Inhale into the lungs. At nighttime  . Tiotropium Bromide-Olodaterol (STIOLTO RESPIMAT) 2.5-2.5 MCG/ACT AERS Inhale 2 puffs into the lungs daily.  Marland Kitchen zolpidem (AMBIEN) 10 MG tablet TAKE 1/2 TO 1 (ONE-HALF TO ONE) TABLET BY MOUTH AT BEDTIME AS NEEDED FOR SLEEP    Allergies: Codeine and Morphine and related  Social History   Tobacco Use  . Smoking status: Former Smoker    Packs/day: 1.00    Years: 42.00    Pack years: 42.00    Types: Cigarettes    Quit date: 11/11/2010    Years since quitting: 8.6  .  Smokeless tobacco: Never Used  Substance Use Topics  . Alcohol use: Yes    Alcohol/week: 4.0 standard drinks    Types: 4 Glasses of wine per week    Comment: weekly  . Drug use: No    Family History  Problem Relation Age of Onset  . Stroke Mother   . Heart disease Mother 62       MI and CABG  . Dementia Mother   . Coronary artery disease Father   . Parkinsonism Father   . Cancer - Cervical Daughter 19       died March 30, 2022  . Heart attack Brother 66  . Colon cancer Neg Hx   . Breast cancer Neg Hx     Review of Systems: A 12-system review of systems was performed and was negative except as noted in the HPI.  --------------------------------------------------------------------------------------------------  Physical Exam: BP 110/68 (BP Location: Left Arm, Patient Position: Sitting, Cuff Size: Normal)   Pulse 69   Ht _0  (1.727 m)   Wt  194 lb 6 oz (88.2 kg)   SpO2 (!) 86%   BMI 29.55 kg/m   General: NAD.  Accompanied by her husband. Neck: No JVD or HJR. Lungs: Mild diminished breath sounds throughout without wheezes or crackles. Heart: Regular rate and rhythm without murmurs, rubs, or gallops. Abdomen: Soft, nontender, nondistended Extremities: No lower extremity edema  EKG: Atrially sensed, ventricularly paced rhythm.  Lab Results  Component Value Date   WBC 5.7 12/28/2018   HGB 13.0 12/28/2018   HCT 39.8 12/28/2018   MCV 91.2 12/28/2018   PLT 284.0 12/28/2018    Lab Results  Component Value Date   NA 137 12/28/2018   K 5.3 (H) 12/28/2018   CL 103 12/28/2018   CO2 26 12/28/2018   BUN 21 12/28/2018   CREATININE 0.84 12/28/2018   GLUCOSE 85 12/28/2018   ALT 19 12/28/2018    Lab Results  Component Value Date   CHOL 205 (H) 12/28/2018   HDL 63.30 12/28/2018   LDLCALC 124 (H) 12/28/2018   TRIG 90.0 12/28/2018   CHOLHDL 3 12/28/2018    --------------------------------------------------------------------------------------------------  ASSESSMENT AND  PLAN: Coronary artery disease, dyspnea on exertion, and atypical chest pain: Symptoms have been a longstanding with only minimal improvement with addition of isosorbide mononitrate.  Due to continued intermittent headaches, we are unable to escalate dose at this time.  Echocardiogram did not reveal a clear etiology for her dyspnea and chest pain.  The question still remains as to whether she has significant RCA disease, as motion artifact limited evaluation of this vessel.  We discussed noninvasive and invasive testing options.  I have recommended that we proceed with right and left heart catheterization and possible PCI, which Ms. Kosel is in agreement with.  I have reviewed the risks, indications, and alternatives to cardiac catheterization, possible angioplasty, and stenting with the patient. Risks include but are not limited to bleeding, infection, vascular injury, stroke, myocardial infection, arrhythmia, kidney injury, radiation-related injury in the case of prolonged fluoroscopy use, emergency cardiac surgery, and death. The patient understands the risks of serious complication is 1-2 in 3500 with diagnostic cardiac cath and 1-2% or less with angioplasty/stenting.  We will defer medication changes at this time.  COPD: Likely driving her dyspnea and hypoxia as coronary artery disease alone does not typically lead to significant hypoxia.  It would be helpful to better assess her intracardiac pressures and also exclude intracardiac shunting; hence we will proceed with right and left heart catheterization with shunt run at the patient's convenience.  Complete heart block status post pacemaker: Appropriate device function.  Continue follow-up with EP.  Follow-up: Return to clinic in 2 to 3 weeks after catheterization.  Nelva Bush, MD 07/08/2019 3:24 PM

## 2019-07-09 ENCOUNTER — Encounter: Payer: Self-pay | Admitting: Internal Medicine

## 2019-07-10 ENCOUNTER — Other Ambulatory Visit: Payer: Self-pay | Admitting: Internal Medicine

## 2019-07-10 DIAGNOSIS — R079 Chest pain, unspecified: Secondary | ICD-10-CM

## 2019-07-10 DIAGNOSIS — R0602 Shortness of breath: Secondary | ICD-10-CM

## 2019-07-11 NOTE — Progress Notes (Signed)
Remote pacemaker transmission.   

## 2019-07-18 ENCOUNTER — Other Ambulatory Visit
Admission: RE | Admit: 2019-07-18 | Discharge: 2019-07-18 | Disposition: A | Discharge status: Home / Self Care | Payer: Medicare Other | Source: Ambulatory Visit | Attending: Internal Medicine | Admitting: Internal Medicine

## 2019-07-18 ENCOUNTER — Other Ambulatory Visit: Payer: Self-pay

## 2019-07-18 DIAGNOSIS — Z20822 Contact with and (suspected) exposure to covid-19: Secondary | ICD-10-CM | POA: Insufficient documentation

## 2019-07-18 DIAGNOSIS — Z01812 Encounter for preprocedural laboratory examination: Secondary | ICD-10-CM | POA: Diagnosis not present

## 2019-07-18 LAB — SARS CORONAVIRUS 2 (TAT 6-24 HRS): SARS Coronavirus 2: NEGATIVE

## 2019-07-19 ENCOUNTER — Ambulatory Visit
Admission: RE | Admit: 2019-07-19 | Discharge: 2019-07-19 | Disposition: A | Discharge status: Home / Self Care | Payer: Medicare Other | Attending: Internal Medicine | Admitting: Internal Medicine

## 2019-07-19 ENCOUNTER — Other Ambulatory Visit: Payer: Self-pay

## 2019-07-19 ENCOUNTER — Encounter: Payer: Self-pay | Admitting: Cardiology

## 2019-07-19 ENCOUNTER — Encounter
Admission: RE | Disposition: A | Discharge status: Home / Self Care | Payer: Medicare Other | Source: Home / Self Care | Attending: Internal Medicine

## 2019-07-19 DIAGNOSIS — M8580 Other specified disorders of bone density and structure, unspecified site: Secondary | ICD-10-CM | POA: Insufficient documentation

## 2019-07-19 DIAGNOSIS — G40909 Epilepsy, unspecified, not intractable, without status epilepticus: Secondary | ICD-10-CM | POA: Diagnosis not present

## 2019-07-19 DIAGNOSIS — Z885 Allergy status to narcotic agent status: Secondary | ICD-10-CM | POA: Insufficient documentation

## 2019-07-19 DIAGNOSIS — Z79899 Other long term (current) drug therapy: Secondary | ICD-10-CM | POA: Insufficient documentation

## 2019-07-19 DIAGNOSIS — M1990 Unspecified osteoarthritis, unspecified site: Secondary | ICD-10-CM | POA: Insufficient documentation

## 2019-07-19 DIAGNOSIS — I2510 Atherosclerotic heart disease of native coronary artery without angina pectoris: Secondary | ICD-10-CM

## 2019-07-19 DIAGNOSIS — R0600 Dyspnea, unspecified: Secondary | ICD-10-CM

## 2019-07-19 DIAGNOSIS — I252 Old myocardial infarction: Secondary | ICD-10-CM | POA: Diagnosis not present

## 2019-07-19 DIAGNOSIS — Z820 Family history of epilepsy and other diseases of the nervous system: Secondary | ICD-10-CM | POA: Diagnosis not present

## 2019-07-19 DIAGNOSIS — I25118 Atherosclerotic heart disease of native coronary artery with other forms of angina pectoris: Secondary | ICD-10-CM | POA: Diagnosis present

## 2019-07-19 DIAGNOSIS — Z7982 Long term (current) use of aspirin: Secondary | ICD-10-CM | POA: Diagnosis not present

## 2019-07-19 DIAGNOSIS — R0789 Other chest pain: Secondary | ICD-10-CM | POA: Diagnosis present

## 2019-07-19 DIAGNOSIS — Z8249 Family history of ischemic heart disease and other diseases of the circulatory system: Secondary | ICD-10-CM | POA: Diagnosis not present

## 2019-07-19 DIAGNOSIS — I442 Atrioventricular block, complete: Secondary | ICD-10-CM | POA: Diagnosis not present

## 2019-07-19 DIAGNOSIS — Z87891 Personal history of nicotine dependence: Secondary | ICD-10-CM | POA: Insufficient documentation

## 2019-07-19 DIAGNOSIS — R0602 Shortness of breath: Secondary | ICD-10-CM | POA: Diagnosis present

## 2019-07-19 DIAGNOSIS — R0609 Other forms of dyspnea: Secondary | ICD-10-CM | POA: Insufficient documentation

## 2019-07-19 DIAGNOSIS — Z9981 Dependence on supplemental oxygen: Secondary | ICD-10-CM | POA: Insufficient documentation

## 2019-07-19 DIAGNOSIS — R079 Chest pain, unspecified: Secondary | ICD-10-CM

## 2019-07-19 DIAGNOSIS — J449 Chronic obstructive pulmonary disease, unspecified: Secondary | ICD-10-CM | POA: Insufficient documentation

## 2019-07-19 HISTORY — PX: RIGHT/LEFT HEART CATH AND CORONARY ANGIOGRAPHY: CATH118266

## 2019-07-19 SURGERY — RIGHT/LEFT HEART CATH AND CORONARY ANGIOGRAPHY
Anesthesia: Moderate Sedation | Laterality: Bilateral

## 2019-07-19 MED ORDER — SODIUM CHLORIDE 0.9% FLUSH: 3.0000 mL | INTRAVENOUS | Status: DC | PRN

## 2019-07-19 MED ORDER — IOHEXOL 300 MG/ML  SOLN
INTRAMUSCULAR | Status: DC | PRN
  Administered 2019-07-19: 55 mL

## 2019-07-19 MED ORDER — ASPIRIN 81 MG PO CHEW: 81.0000 mg | CHEWABLE_TABLET | ORAL | Status: DC

## 2019-07-19 MED ORDER — SODIUM CHLORIDE 0.9 % WEIGHT BASED INFUSION: 1.0000 mL/kg/h | INTRAVENOUS | Status: DC

## 2019-07-19 MED ORDER — VERAPAMIL HCL 2.5 MG/ML IV SOLN
INTRAVENOUS | Status: AC
  Filled 2019-07-19: qty 2

## 2019-07-19 MED ORDER — VERAPAMIL HCL 2.5 MG/ML IV SOLN
INTRAVENOUS | Status: DC | PRN
  Administered 2019-07-19: 2.5 mg via INTRA_ARTERIAL

## 2019-07-19 MED ORDER — SODIUM CHLORIDE 0.9% FLUSH: 3.0000 mL | Freq: Two times a day (BID) | INTRAVENOUS | Status: DC

## 2019-07-19 MED ORDER — HYDRALAZINE HCL 20 MG/ML IJ SOLN: 10.0000 mg | INTRAMUSCULAR | Status: DC | PRN

## 2019-07-19 MED ORDER — LABETALOL HCL 5 MG/ML IV SOLN: 10.0000 mg | INTRAVENOUS | Status: DC | PRN

## 2019-07-19 MED ORDER — HEPARIN (PORCINE) IN NACL 1000-0.9 UT/500ML-% IV SOLN
INTRAVENOUS | Status: AC
  Filled 2019-07-19: qty 500

## 2019-07-19 MED ORDER — ACETAMINOPHEN 325 MG PO TABS: 650.0000 mg | ORAL_TABLET | ORAL | Status: DC | PRN

## 2019-07-19 MED ORDER — HEPARIN (PORCINE) IN NACL 1000-0.9 UT/500ML-% IV SOLN
INTRAVENOUS | Status: DC | PRN
  Administered 2019-07-19: 500 mL

## 2019-07-19 MED ORDER — ONDANSETRON HCL 4 MG/2ML IJ SOLN: 4.0000 mg | Freq: Four times a day (QID) | INTRAMUSCULAR | Status: DC | PRN

## 2019-07-19 MED ORDER — HEPARIN SODIUM (PORCINE) 1000 UNIT/ML IJ SOLN
INTRAMUSCULAR | Status: DC | PRN
  Administered 2019-07-19: 4500 [IU] via INTRAVENOUS

## 2019-07-19 MED ORDER — SODIUM CHLORIDE 0.9 % WEIGHT BASED INFUSION
3.0000 mL/kg/h | INTRAVENOUS | Status: AC
  Administered 2019-07-19: 3 mL/kg/h via INTRAVENOUS

## 2019-07-19 MED ORDER — MIDAZOLAM HCL 2 MG/2ML IJ SOLN
INTRAMUSCULAR | Status: DC | PRN
  Administered 2019-07-19: 1 mg via INTRAVENOUS

## 2019-07-19 MED ORDER — SODIUM CHLORIDE 0.9 % IV SOLN: 250.0000 mL | INTRAVENOUS | Status: DC | PRN

## 2019-07-19 MED ORDER — HEPARIN SODIUM (PORCINE) 1000 UNIT/ML IJ SOLN
INTRAMUSCULAR | Status: AC
  Filled 2019-07-19: qty 1

## 2019-07-19 MED ORDER — FUROSEMIDE 40 MG PO TABS: 40.0000 mg | ORAL_TABLET | Freq: Every day | ORAL | 2 refills | Status: DC

## 2019-07-19 MED ORDER — FENTANYL CITRATE (PF) 100 MCG/2ML IJ SOLN
INTRAMUSCULAR | Status: AC
  Filled 2019-07-19: qty 2

## 2019-07-19 MED ORDER — FENTANYL CITRATE (PF) 100 MCG/2ML IJ SOLN
INTRAMUSCULAR | Status: DC | PRN
  Administered 2019-07-19: 50 ug via INTRAVENOUS

## 2019-07-19 MED ORDER — SODIUM CHLORIDE 0.9 % IV SOLN: INTRAVENOUS | Status: DC

## 2019-07-19 MED ORDER — MIDAZOLAM HCL 2 MG/2ML IJ SOLN
INTRAMUSCULAR | Status: AC
  Filled 2019-07-19: qty 2

## 2019-07-19 SURGICAL SUPPLY — 12 items
CATH 5F 110X4 TIG (CATHETERS) ×2 IMPLANT
CATH BALLN WEDGE 5F 110CM (CATHETERS) ×2 IMPLANT
CATH INFINITI 5FR ANG PIGTAIL (CATHETERS) ×2 IMPLANT
DEVICE RAD TR BAND REGULAR (VASCULAR PRODUCTS) ×2 IMPLANT
GLIDESHEATH SLEND SS 6F .021 (SHEATH) ×2 IMPLANT
GUIDEWIRE EMER 3M J .025X150CM (WIRE) ×2 IMPLANT
GUIDEWIRE INQWIRE 1.5J.035X260 (WIRE) IMPLANT
INQWIRE 1.5J .035X260CM (WIRE) ×3
KIT MANI 3VAL PERCEP (MISCELLANEOUS) ×3 IMPLANT
KIT RIGHT HEART (MISCELLANEOUS) ×3 IMPLANT
PACK CARDIAC CATH (CUSTOM PROCEDURE TRAY) ×2 IMPLANT
SHEATH GLIDE SLENDER 4/5FR (SHEATH) ×2 IMPLANT

## 2019-07-19 NOTE — Interval H&P Note (Signed)
History and Physical Interval Note:  07/19/2019 9:27 AM  Diana Cook  has presented today for surgery, with the diagnosis of chest pain, shortness of breath, and coronary artery disease.  The various methods of treatment have been discussed with the patient and family. After consideration of risks, benefits and other options for treatment, the patient has consented to  Procedure(s): RIGHT/LEFT HEART CATH AND CORONARY ANGIOGRAPHY (Bilateral) as a surgical intervention.  The patient's history has been reviewed, patient examined, no change in status, stable for surgery.  I have reviewed the patient's chart and labs.  Questions were answered to the patient's satisfaction.    Cath Lab Visit (complete for each Cath Lab visit)  Clinical Evaluation Leading to the Procedure:   ACS: No.  Non-ACS:    Anginal Classification: CCS IV  Anti-ischemic medical therapy: Minimal Therapy (1 class of medications)  Non-Invasive Test Results: Equivocal test results  Prior CABG: No previous CABG  Diana Cook

## 2019-07-19 NOTE — Discharge Instructions (Signed)
Radial Site Care  This sheet gives you information about how to care for yourself after your procedure. Your health care provider may also give you more specific instructions. If you have problems or questions, contact your health care provider. What can I expect after the procedure? After the procedure, it is common to have:  Bruising and tenderness at the catheter insertion area. Follow these instructions at home: Medicines  Take over-the-counter and prescription medicines only as told by your health care provider. Insertion site care  Follow instructions from your health care provider about how to take care of your insertion site. Make sure you: ? Wash your hands with soap and water before you change your bandage (dressing). If soap and water are not available, use hand sanitizer. ? Change your dressing as told by your health care provider. ? Leave stitches (sutures), skin glue, or adhesive strips in place. These skin closures may need to stay in place for 2 weeks or longer. If adhesive strip edges start to loosen and curl up, you may trim the loose edges. Do not remove adhesive strips completely unless your health care provider tells you to do that.  Check your insertion site every day for signs of infection. Check for: ? Redness, swelling, or pain. ? Fluid or blood. ? Pus or a bad smell. ? Warmth.  Do not take baths, swim, or use a hot tub until your health care provider approves.  You may shower 24-48 hours after the procedure, or as directed by your health care provider. ? Remove the dressing and gently wash the site with plain soap and water. ? Pat the area dry with a clean towel. ? Do not rub the site. That could cause bleeding.  Do not apply powder or lotion to the site. Activity   For 24 hours after the procedure, or as directed by your health care provider: ? Do not flex or bend the affected arm. ? Do not push or pull heavy objects with the affected arm. ? Do not  drive yourself home from the hospital or clinic. You may drive 24 hours after the procedure unless your health care provider tells you not to. ? Do not operate machinery or power tools.  Do not lift anything that is heavier than 10 lb (4.5 kg), or the limit that you are told, until your health care provider says that it is safe.  Ask your health care provider when it is okay to: ? Return to work or school. ? Resume usual physical activities or sports. ? Resume sexual activity. General instructions  If the catheter site starts to bleed, raise your arm and put firm pressure on the site. If the bleeding does not stop, get help right away. This is a medical emergency.  If you went home on the same day as your procedure, a responsible adult should be with you for the first 24 hours after you arrive home.  Keep all follow-up visits as told by your health care provider. This is important. Contact a health care provider if:  You have a fever.  You have redness, swelling, or yellow drainage around your insertion site. Get help right away if:  You have unusual pain at the radial site.  The catheter insertion area swells very fast.  The insertion area is bleeding, and the bleeding does not stop when you hold steady pressure on the area.  Your arm or hand becomes pale, cool, tingly, or numb. These symptoms may represent a serious problem   that is an emergency. Do not wait to see if the symptoms will go away. Get medical help right away. Call your local emergency services (911 in the [place] Do not drive yourself to the hospital. Summary  After the procedure, it is common to have bruising and tenderness at the site.  Follow instructions from your health care provider about how to take care of your radial site wound. Check the wound every day for signs of infection.  Do not lift anything that is heavier than 10 lb (4.5 kg), or the limit that you are told, until your health care provider says  that it is safe. This information is not intended to replace advice given to you by your health care provider. Make sure you discuss any questions you have with your health care provider. Document Revised: 03/18/2017 Document Reviewed: 03/18/2017 Elsevier Patient Education  2020 Elsevier Inc.  

## 2019-08-08 ENCOUNTER — Ambulatory Visit (INDEPENDENT_AMBULATORY_CARE_PROVIDER_SITE_OTHER): Payer: Medicare Other | Admitting: *Deleted

## 2019-08-08 DIAGNOSIS — I442 Atrioventricular block, complete: Secondary | ICD-10-CM

## 2019-08-09 LAB — CUP PACEART REMOTE DEVICE CHECK
Battery Remaining Longevity: 3 mo — CL
Battery Remaining Percentage: 2 %
Brady Statistic RA Percent Paced: 6 %
Brady Statistic RV Percent Paced: 100 %
Date Time Interrogation Session: 20210614041100
Implantable Lead Implant Date: 20130830
Implantable Lead Implant Date: 20130830
Implantable Lead Location: 753859
Implantable Lead Location: 753860
Implantable Lead Model: 4456
Implantable Lead Model: 4479
Implantable Lead Serial Number: 473325
Implantable Lead Serial Number: 523784
Implantable Pulse Generator Implant Date: 20130830
Lead Channel Impedance Value: 408 Ohm
Lead Channel Impedance Value: 632 Ohm
Lead Channel Pacing Threshold Amplitude: 0.7 V
Lead Channel Pacing Threshold Pulse Width: 0.5 ms
Lead Channel Setting Pacing Amplitude: 2 V
Lead Channel Setting Pacing Amplitude: 2.4 V
Lead Channel Setting Pacing Pulse Width: 0.5 ms
Lead Channel Setting Sensing Sensitivity: 2.5 mV
Pulse Gen Serial Number: 134132

## 2019-08-09 NOTE — Addendum Note (Signed)
Addended by: Cheri Kearns A on: 08/09/2019 03:54 PM   Modules accepted: Level of Service

## 2019-08-09 NOTE — Progress Notes (Signed)
Remote pacemaker transmission.   

## 2019-08-10 ENCOUNTER — Encounter: Payer: Self-pay | Admitting: Internal Medicine

## 2019-08-10 ENCOUNTER — Ambulatory Visit (INDEPENDENT_AMBULATORY_CARE_PROVIDER_SITE_OTHER): Payer: Medicare Other | Admitting: Internal Medicine

## 2019-08-10 ENCOUNTER — Encounter: Payer: Self-pay | Admitting: Pulmonary Disease

## 2019-08-10 ENCOUNTER — Ambulatory Visit: Payer: Medicare Other | Admitting: Pulmonary Disease

## 2019-08-10 ENCOUNTER — Other Ambulatory Visit: Payer: Self-pay

## 2019-08-10 VITALS — BP 90/60 | HR 78 | Ht 68.0 in | Wt 191.5 lb

## 2019-08-10 VITALS — BP 116/68 | HR 69 | Temp 98.3°F | Ht 68.0 in | Wt 192.2 lb

## 2019-08-10 DIAGNOSIS — E785 Hyperlipidemia, unspecified: Secondary | ICD-10-CM | POA: Diagnosis not present

## 2019-08-10 DIAGNOSIS — J449 Chronic obstructive pulmonary disease, unspecified: Secondary | ICD-10-CM

## 2019-08-10 DIAGNOSIS — Z87891 Personal history of nicotine dependence: Secondary | ICD-10-CM

## 2019-08-10 DIAGNOSIS — I2510 Atherosclerotic heart disease of native coronary artery without angina pectoris: Secondary | ICD-10-CM | POA: Diagnosis not present

## 2019-08-10 DIAGNOSIS — G4736 Sleep related hypoventilation in conditions classified elsewhere: Secondary | ICD-10-CM

## 2019-08-10 DIAGNOSIS — J432 Centrilobular emphysema: Secondary | ICD-10-CM

## 2019-08-10 DIAGNOSIS — I5032 Chronic diastolic (congestive) heart failure: Secondary | ICD-10-CM

## 2019-08-10 DIAGNOSIS — R0602 Shortness of breath: Secondary | ICD-10-CM

## 2019-08-10 DIAGNOSIS — J439 Emphysema, unspecified: Secondary | ICD-10-CM

## 2019-08-10 NOTE — Patient Instructions (Signed)
Medication Instructions:  Your physician recommends that you continue on your current medications as directed. Please refer to the Current Medication list given to you today.  *If you need a refill on your cardiac medications before your next appointment, please call your pharmacy*   Lab Work: Your physician recommends that you have lab work today(BMET)   If you have labs (blood work) drawn today and your tests are completely normal, you will receive your results only by: Marland Kitchen MyChart Message (if you have MyChart) OR . A paper copy in the mail If you have any lab test that is abnormal or we need to change your treatment, we will call you to review the results.   Testing/Procedures: None ordered   Follow-Up: At [facility], you and your health needs are our priority.  As part of our continuing mission to provide you with exceptional heart care, we have created designated Provider Care Teams.  These Care Teams include your primary Cardiologist (physician) and Advanced Practice Providers (APPs -  Physician Assistants and Nurse Practitioners) who all work together to provide you with the care you need, when you need it.  We recommend signing up for the patient portal called "MyChart".  Sign up information is provided on this After Visit Summary.  MyChart is used to connect with patients for Virtual Visits (Telemedicine).  Patients are able to view lab/test results, encounter notes, upcoming appointments, etc.  Non-urgent messages can be sent to your provider as well.   To learn more about what you can do with MyChart, go to [email or web address].    Your next appointment:   3 month(s)  The format for your next appointment:   In Person  Provider:    You may see Dr. Saunders Revel or one of the following Advanced Practice Providers on your designated Care Team:    Murray Hodgkins, NP  Christell Faith, PA-C  Marrianne Mood, PA-C

## 2019-08-10 NOTE — Progress Notes (Signed)
 Assessment & Plan:  1. Centrilobular emphysema (HCC) (Primary) Comments: FEV1 and FVC preserved Continue Stiolto Continue as needed albuterol  - AMB REFERRAL FOR DME  2. SOB (shortness of breath) on exertion Comments: Multifactorial Diuretics adjusted recently FEV1 is preserved, no further optimization with inhalers  3. Nocturnal hypoxemia due to emphysema [facility]) Comments: Decrease nocturnal O2 to 2 L/min We will recheck overnight oximetry on 2 L/min  4. Ex-heavy cigarette smoker (20-39 per day) Comments: Enrolled in lung cancer screening Next low-dose CT November 2021   Patient Instructions  Continue Stiolto.  Use your emergency inhaler for episodes of breathlessness.  Continue oxygen  at nighttime.  Decreased to 2 L/min.  We are going to check your oxygen  levels on this decreased amount of oxygen .  We will see you in follow-up in 3 months time call sooner should any new difficulties arise.  Please note: late entry documentation due to logistical difficulties during COVID-19 pandemic. This note is filed for information purposes only, and is not intended to be used for billing, nor does it represent the full scope/nature of the visit in question. Please see any associated scanned media linked to date of encounter for additional pertinent information.  Subjective:    HPI: Diana Cook is a 69 y.o. female presenting to the pulmonology clinic on 08/10/2019 with report of: Follow-up (would like to dicuss next steps for treatment after recent heart cath. c/o sob with exertion and non prod cough. on 3L QHS)     No facility-administered encounter medications on file as of 08/10/2019.   Outpatient Encounter Medications as of 08/10/2019  Medication Sig Note   acetaminophen  (TYLENOL ) 500 MG tablet Take 500-1,000 mg by mouth every 6 (six) hours as needed for mild pain or headache.  08/28/2023: PRN   OXYGEN  Inhale 4 L into the lungs continuous.    [DISCONTINUED] albuterol  (VENTOLIN   HFA) 108 (90 Base) MCG/ACT inhaler Inhale 2 puffs into the lungs every 4 (four) hours as needed for wheezing or shortness of breath.    [DISCONTINUED] aspirin  EC 81 MG tablet Take 1 tablet (81 mg total) by mouth daily. (Patient taking differently: Take 81 mg by mouth at bedtime. )    [DISCONTINUED] Calcium -Vitamin D -Vitamin K  750-500-40 MG-UNT-MCG TABS Take 1 tablet by mouth 2 (two) times daily.     [DISCONTINUED] famotidine  (PEPCID ) 20 MG tablet Take 1 tablet (20 mg total) by mouth daily. **PLEASE SCHEDULE A FOLLOW UP APPT** (Patient not taking: Reported on 12/01/2019)    [DISCONTINUED] FLUoxetine  (PROZAC ) 20 MG capsule Take 1 capsule (20 mg total) by mouth daily.    [DISCONTINUED] furosemide  (LASIX ) 40 MG tablet Take 1 tablet (40 mg total) by mouth daily.    [DISCONTINUED] levETIRAcetam  (KEPPRA ) 500 MG tablet Take 500 mg by mouth daily.     [DISCONTINUED] Tiotropium Bromide-Olodaterol (STIOLTO RESPIMAT ) 2.5-2.5 MCG/ACT AERS Inhale 2 puffs into the lungs daily. (Patient taking differently: Inhale 2 puffs into the lungs at bedtime. )    [DISCONTINUED] zolpidem  (AMBIEN ) 10 MG tablet TAKE 1/2 TO 1 (ONE-HALF TO ONE) TABLET BY MOUTH AT BEDTIME AS NEEDED FOR SLEEP (Patient taking differently: Take 10 mg by mouth at bedtime. )    [DISCONTINUED] atorvastatin  (LIPITOR) 40 MG tablet Take 1 tablet (40 mg total) by mouth daily. (Patient taking differently: Take 40 mg by mouth at bedtime. )       Objective:   Vitals:   08/10/19 0938  BP: 116/68  Pulse: 69  Temp: 98.3 F (36.8 C)  Height:  5' 8 (1.727 m)  Weight: 192 lb 3.2 oz (87.2 kg)  SpO2: 91%  TempSrc: Temporal  BMI (Calculated): 29.23     Physical exam documentation is limited by delayed entry of information.

## 2019-08-10 NOTE — Progress Notes (Signed)
Follow-up Outpatient Visit Date: 08/10/2019  Primary Care Provider: Abner Greenspan, MD [place] [address]  Chief Complaint: Follow-up heart catheterization  HPI:  Diana Cook is a 69 y.o. female with history of moderate coronary artery disease by cardiac CTA in 11/2018, complete heart block status post permanent pacemaker followed by Dr. Caryl Comes, seizure disorder, COPD, and right breast cancer, who presents for follow-up of shortness of breath.  I last saw her a month ago, at which time Diana Cook continues to complain of exertional dyspnea and intermittent chest pain.  She subsequently underwent right and left heart catheterization, which showed mild to moderate, nonobstructive coronary artery disease and mildly elevated right and left heart filling pressures.  Today, Diana Cook reports she has been feeling relatively well.  She has stable exertional dyspnea.  She has not had any chest pain, palpitations, or lightheadedness.  Her right radial/antecubital catheterization sites have healed and are not bothering her.  She has noticed some edema, especially in her thighs and fingers despite escalation of furosemide to 40 mg daily following in our heart catheterization last month.  --------------------------------------------------------------------------------------------------  Past Medical History:  Diagnosis Date  . Allergic rhinitis   . Arthritis   . Asthma    as a child, mild now  . Breast cancer ([place]) 12/2015   right breast cancer, lumpectomy and mammosite   . Colon polyps    colonoscopy 7/08, tubular adenoma  . Complete heart block ([place]) 8/13   s/p PPM implanted in [facility]  . COPD (chronic obstructive pulmonary disease) ([place])   . Myocardial infarction ([place]) 2011  . Osteopenia 10/2015  . Pacemaker    2011  . Personal history of radiation therapy 2017   right breast ca, mammosite placed  . Seizure disorder ([place])   . Seizures ([place])    first one was when  she was 69 years old   . Small bowel obstruction ([place])    1988 and 2002  . Tobacco abuse    Past Surgical History:  Procedure Laterality Date  . ABDOMINAL HYSTERECTOMY    . BREAST BIOPSY Right 2007   benign inflammatory changes, mass due to underwire bra  . BREAST BIOPSY Left 01/02/2016   columnar cell changes without atypical hyperplasia.  Marland Kitchen BREAST BIOPSY Right 12/06/2015   rt breast mass 10:00, bx done at Dr. Curly [place] office, invasive ductal carcinoma  . BREAST EXCISIONAL BIOPSY Left 01/02/2016   COLUMNAR CELL CHANGE AND HYPERPLASIA ASSOCIATED WITH LUMINAL AND STROMAL CALCIFICATIONS  . BREAST LUMPECTOMY Right 01/02/2016   invasive mammary carcinoma, clear margins, negative LN  . BREAST LUMPECTOMY WITH SENTINEL LYMPH NODE BIOPSY Right 01/02/2016   pT1c, N0; ER/ PR 100%; Her 2 neu not over expressed: BREAST LUMPECTOMY WITH SENTINEL LYMPH NODE BX;  Surgeon: [name], MD;  Location: [facility] ORS;  Service: General;  Laterality: Right;  . CARDIAC CATHETERIZATION    . CATARACT EXTRACTION Bilateral   . COLONOSCOPY  10/2015   Dr Ardis Hughs  . COLONOSCOPY WITH PROPOFOL N/A 08/19/2018   Procedure: COLONOSCOPY WITH PROPOFOL;  Surgeon: Virgel Manifold, MD;  Location: [facility] ENDOSCOPY;  Service: Endoscopy;  Laterality: N/A;  . ESOPHAGOGASTRODUODENOSCOPY (EGD) WITH PROPOFOL N/A 08/19/2018   Procedure: ESOPHAGOGASTRODUODENOSCOPY (EGD) WITH PROPOFOL;  Surgeon: Virgel Manifold, MD;  Location: [facility] ENDOSCOPY;  Service: Endoscopy;  Laterality: N/A;  . EXPLORATORY LAPAROTOMY  01/25/2001   Exploratory laparotomy, lysis of adhesions, identification of internal hernia secondary to omental adhesion. Prolonged postoperative ileus.  Marland Kitchen  gyn surgery  1993   hysterectomy- form endometriosis  . LAPAROSCOPY    . PACEMAKER INSERTION  10/24/11   [employer] Advantio dual chamber PPM implanted by Dr Bunnie Philips at [facility] in [place]  . RIGHT/LEFT HEART CATH AND CORONARY ANGIOGRAPHY  Bilateral 07/19/2019   Procedure: RIGHT/LEFT HEART CATH AND CORONARY ANGIOGRAPHY;  Surgeon: Nelva Bush, MD;  Location: [facility];  Service: Cardiovascular;  Laterality: Bilateral;    Current Meds  Medication Sig  . acetaminophen (TYLENOL) 500 MG tablet Take 1,000 mg by mouth every 6 (six) hours as needed for mild pain or headache.   . albuterol (VENTOLIN HFA) 108 (90 Base) MCG/ACT inhaler Inhale 2 puffs into the lungs every 4 (four) hours as needed for wheezing or shortness of breath.  Marland Kitchen aspirin EC 81 MG tablet Take 1 tablet (81 mg total) by mouth daily. (Patient taking differently: Take 81 mg by mouth at bedtime. )  . atorvastatin (LIPITOR) 40 MG tablet Take 1 tablet (40 mg total) by mouth daily. (Patient taking differently: Take 40 mg by mouth at bedtime. )  . atorvastatin (LIPITOR) 40 MG tablet Take 40 mg by mouth at bedtime.  . Calcium-Vitamin D-Vitamin K 093-267-12 MG-UNT-MCG TABS Take 1 tablet by mouth 2 (two) times daily.   . famotidine (PEPCID) 20 MG tablet Take 1 tablet (20 mg total) by mouth daily. **PLEASE SCHEDULE A FOLLOW UP APPT**  . FLUoxetine (PROZAC) 20 MG capsule Take 1 capsule (20 mg total) by mouth daily.  . furosemide (LASIX) 40 MG tablet Take 1 tablet (40 mg total) by mouth daily.  Marland Kitchen levETIRAcetam (KEPPRA) 500 MG tablet Take 500 mg by mouth daily.   . OXYGEN Inhale 3 L into the lungs at bedtime.   . Tiotropium Bromide-Olodaterol (STIOLTO RESPIMAT) 2.5-2.5 MCG/ACT AERS Inhale 2 puffs into the lungs daily.  Marland Kitchen zolpidem (AMBIEN) 10 MG tablet Take 10 mg by mouth at bedtime.    Allergies: Codeine and Morphine and related  Social History   Tobacco Use  . Smoking status: Former Smoker    Packs/day: 1.00    Years: 42.00    Pack years: 42.00    Types: Cigarettes    Quit date: 11/11/2010    Years since quitting: 8.7  . Smokeless tobacco: Never Used  Vaping Use  . Vaping Use: Never used  Substance Use Topics  . Alcohol use: Yes    Alcohol/week: 4.0  standard drinks    Types: 4 Glasses of wine per week    Comment: weekly  . Drug use: No    Family History  Problem Relation Age of Onset  . Stroke Mother   . Heart disease Mother 55       MI and CABG  . Dementia Mother   . Coronary artery disease Father   . Parkinsonism Father   . Cancer - Cervical Daughter 14       died Mar 22, 2022  . Heart attack Brother 47  . Colon cancer Neg Hx   . Breast cancer Neg Hx     Review of Systems: A 12-system review of systems was performed and was negative except as noted in the HPI.  --------------------------------------------------------------------------------------------------  Physical Exam: BP 90/60 (BP Location: Left Arm, Patient Position: Sitting, Cuff Size: Large)   Pulse 78   Ht 5\' 8"  (1.727 m)   Wt 191 lb 8 oz (86.9 kg)   SpO2 94%   BMI 29.12 kg/m   General: NAD.  Accompanied by her  husband. HEENT: No conjunctival pallor or scleral icterus. Facemask in place. Neck: No JVD or HJR. Lungs: Normal work of breathing.  Mildly diminished breath sounds without wheezes or crackles. Heart: Regular rate and rhythm without murmurs, rubs, or gallops. Abd: Bowel sounds present. Soft, NT/ND. Ext: No lower extremity edema.  2+ radial pulses.  Right radial arteriotomy and antecubital venotomy sites are well-healed without hematoma or bruising.. Skin: Warm and dry without rash.  EKG: Normal sinus rhythm with atrial sensing and ventricular pacing.  Lab Results  Component Value Date   WBC 6.1 07/08/2019   HGB 13.7 07/08/2019   HCT 42.1 07/08/2019   MCV 90.0 07/08/2019   PLT 276 07/08/2019    Lab Results  Component Value Date   NA 137 08/10/2019   K 4.2 08/10/2019   CL 100 08/10/2019   CO2 21 08/10/2019   BUN 25 08/10/2019   CREATININE 1.00 08/10/2019   GLUCOSE 105 (H) 08/10/2019   ALT 19 12/28/2018    Lab Results  Component Value Date   CHOL 205 (H) 12/28/2018   HDL 63.30 12/28/2018   LDLCALC 124 (H) 12/28/2018   TRIG 90.0  12/28/2018   CHOLHDL 3 12/28/2018    --------------------------------------------------------------------------------------------------  ASSESSMENT AND PLAN: Chronic HFpEF: Ms. Emile appears euvolemic on exam, though she feels as though she has been more swollen recently.  Her weight is down about 3 pounds from her peak in the last 6 months.  Recent right and left heart catheterization was notable for mildly elevated left and right heart filling pressures.  I have encouraged Ms. Musa to minimize her sodium intake and to continue with furosemide 40 mg daily.  If she has worsening edema or gains more than 2 pounds in a day or 5 pounds in a week, she should increase furosemide to 40 mg twice daily until her weight/edema have returned to baseline.  I will check a basic metabolic panel today to ensure stable renal function and electrolytes.  Nonobstructive coronary artery disease: Mild to moderate, nonobstructive CAD were noted on recent catheterization.  I do not believe that her CAD is driving her dyspnea and hypoxia.  We will continue medical therapy to prevent progression of disease.  I think it is reasonable for Ms. Lucks to restart aspirin 81 mg daily.  Hyperlipidemia: Continue atorvastatin 40 mg daily.  COPD: This is likely contributing to Ms. Vuolo's hypoxia and chronic exertional dyspnea.  She should continue her current medications and ongoing follow-up with Dr. Patsey Berthold.  Complete heart block: Appropriate pacemaker function today with atrial sensing and ventricular pacing.  Device is approaching ERI.  Continue close follow-up with the device clinic and Dr. Caryl Comes.  Follow-up: Return to clinic in 3 months.  Nelva Bush, MD 08/11/2019 7:03 AM

## 2019-08-10 NOTE — Patient Instructions (Signed)
Continue Stiolto.  Use your emergency inhaler for episodes of breathlessness.  Continue oxygen at nighttime.  Decreased to 2 L/min.  We are going to check your oxygen levels on this decreased amount of oxygen.  We will see you in follow-up in 3 months time call sooner should any new difficulties arise.

## 2019-08-11 ENCOUNTER — Encounter: Payer: Self-pay | Admitting: Internal Medicine

## 2019-08-11 DIAGNOSIS — I5032 Chronic diastolic (congestive) heart failure: Secondary | ICD-10-CM | POA: Insufficient documentation

## 2019-08-11 LAB — BASIC METABOLIC PANEL
BUN/Creatinine Ratio: 25 (ref 12–28)
BUN: 25 mg/dL (ref 8–27)
CO2: 21 mmol/L (ref 20–29)
Calcium: 9.9 mg/dL (ref 8.7–10.3)
Chloride: 100 mmol/L (ref 96–106)
Creatinine, Ser: 1 mg/dL (ref 0.57–1.00)
GFR calc Af Amer: 65 mL/min/{1.73_m2} (ref >59)
GFR calc non Af Amer: 57 mL/min/{1.73_m2} — ABNORMAL LOW (ref >59)
Glucose: 105 mg/dL — ABNORMAL HIGH (ref 65–99)
Potassium: 4.2 mmol/L (ref 3.5–5.2)
Sodium: 137 mmol/L (ref 134–144)

## 2019-08-15 ENCOUNTER — Ambulatory Visit: Payer: Medicare Other | Admitting: Internal Medicine

## 2019-08-17 DIAGNOSIS — G4733 Obstructive sleep apnea (adult) (pediatric): Secondary | ICD-10-CM | POA: Diagnosis not present

## 2019-08-17 DIAGNOSIS — R569 Unspecified convulsions: Secondary | ICD-10-CM | POA: Diagnosis not present

## 2019-08-26 ENCOUNTER — Ambulatory Visit (INDEPENDENT_AMBULATORY_CARE_PROVIDER_SITE_OTHER): Payer: Medicare Other | Admitting: *Deleted

## 2019-08-26 DIAGNOSIS — I442 Atrioventricular block, complete: Secondary | ICD-10-CM

## 2019-08-26 LAB — CUP PACEART REMOTE DEVICE CHECK
Battery Remaining Longevity: 3 mo — CL
Battery Remaining Percentage: 2 %
Brady Statistic RA Percent Paced: 5 %
Brady Statistic RV Percent Paced: 100 %
Date Time Interrogation Session: 20210702041100
Implantable Lead Implant Date: 20130830
Implantable Lead Implant Date: 20130830
Implantable Lead Location: 753859
Implantable Lead Location: 753860
Implantable Lead Model: 4456
Implantable Lead Model: 4479
Implantable Lead Serial Number: 473325
Implantable Lead Serial Number: 523784
Implantable Pulse Generator Implant Date: 20130830
Lead Channel Impedance Value: 447 Ohm
Lead Channel Impedance Value: 598 Ohm
Lead Channel Pacing Threshold Amplitude: 0.7 V
Lead Channel Pacing Threshold Pulse Width: 0.5 ms
Lead Channel Setting Pacing Amplitude: 2 V
Lead Channel Setting Pacing Amplitude: 2.4 V
Lead Channel Setting Pacing Pulse Width: 0.5 ms
Lead Channel Setting Sensing Sensitivity: 2.5 mV
Pulse Gen Serial Number: 134132

## 2019-08-30 NOTE — Telephone Encounter (Signed)
Diana Cook, can you help with this? Order was placed on 08/10/2019

## 2019-08-30 NOTE — Progress Notes (Signed)
Remote pacemaker transmission.   

## 2019-09-08 ENCOUNTER — Ambulatory Visit (INDEPENDENT_AMBULATORY_CARE_PROVIDER_SITE_OTHER): Payer: Medicare Other | Admitting: *Deleted

## 2019-09-08 DIAGNOSIS — I442 Atrioventricular block, complete: Secondary | ICD-10-CM

## 2019-09-08 LAB — CUP PACEART REMOTE DEVICE CHECK
Battery Remaining Longevity: 3 mo — CL
Battery Remaining Percentage: 2 %
Brady Statistic RA Percent Paced: 5 %
Brady Statistic RV Percent Paced: 100 %
Date Time Interrogation Session: 20210715080400
Implantable Lead Implant Date: 20130830
Implantable Lead Implant Date: 20130830
Implantable Lead Location: 753859
Implantable Lead Location: 753860
Implantable Lead Model: 4456
Implantable Lead Model: 4479
Implantable Lead Serial Number: 473325
Implantable Lead Serial Number: 523784
Implantable Pulse Generator Implant Date: 20130830
Lead Channel Impedance Value: 495 Ohm
Lead Channel Impedance Value: 645 Ohm
Lead Channel Pacing Threshold Amplitude: 0.7 V
Lead Channel Pacing Threshold Pulse Width: 0.5 ms
Lead Channel Setting Pacing Amplitude: 2 V
Lead Channel Setting Pacing Amplitude: 2.4 V
Lead Channel Setting Pacing Pulse Width: 0.5 ms
Lead Channel Setting Sensing Sensitivity: 2.5 mV
Pulse Gen Serial Number: 134132

## 2019-09-12 NOTE — Progress Notes (Signed)
Remote pacemaker transmission.   

## 2019-09-16 ENCOUNTER — Telehealth: Payer: Self-pay

## 2019-09-16 DIAGNOSIS — J432 Centrilobular emphysema: Secondary | ICD-10-CM

## 2019-09-16 NOTE — Telephone Encounter (Signed)
ONO reviewed by Dr. Patsey Berthold-- recommend increased O2 to 2.5L QHS. Repeat ONO on 2.5L.  Lm for pt.

## 2019-09-16 NOTE — Telephone Encounter (Signed)
Pt calling back about this. Please return call.

## 2019-09-16 NOTE — Telephone Encounter (Addendum)
Pt is aware of results and voiced her understanding.  Order for 2.5L and ONO has been ordered.  Nothing further is needed at this time.

## 2019-09-22 DIAGNOSIS — M5136 Other intervertebral disc degeneration, lumbar region: Secondary | ICD-10-CM | POA: Diagnosis not present

## 2019-09-22 DIAGNOSIS — M5416 Radiculopathy, lumbar region: Secondary | ICD-10-CM | POA: Diagnosis not present

## 2019-09-23 ENCOUNTER — Other Ambulatory Visit: Payer: Self-pay | Admitting: Pulmonary Disease

## 2019-09-23 NOTE — Telephone Encounter (Signed)
Rx for stiolto has been sent to [facility]. Pt is aware and voiced her understanding.  Nothing further is needed.

## 2019-09-26 DIAGNOSIS — Z961 Presence of intraocular lens: Secondary | ICD-10-CM | POA: Diagnosis not present

## 2019-10-03 ENCOUNTER — Other Ambulatory Visit: Payer: Self-pay | Admitting: Family Medicine

## 2019-10-03 NOTE — Telephone Encounter (Signed)
D/c in error then Dr. Darnelle Bos office put it back on med list as an historical entry but last fill 04/04/19  Name of Medication: Ambien Name of Pharmacy: [facility] or Written Date and Quantity: 04/04/19 #30 tabs with 5 refills Last Office Visit and Type: CPE 01/04/20 Next Office Visit and Type: none scheduled:

## 2019-10-10 ENCOUNTER — Ambulatory Visit (INDEPENDENT_AMBULATORY_CARE_PROVIDER_SITE_OTHER): Payer: Medicare Other | Admitting: *Deleted

## 2019-10-10 DIAGNOSIS — I442 Atrioventricular block, complete: Secondary | ICD-10-CM

## 2019-10-10 LAB — CUP PACEART REMOTE DEVICE CHECK
Battery Remaining Longevity: 3 mo — CL
Battery Remaining Percentage: 2 %
Brady Statistic RA Percent Paced: 6 %
Brady Statistic RV Percent Paced: 100 %
Date Time Interrogation Session: 20210816041100
Implantable Lead Implant Date: 20130830
Implantable Lead Implant Date: 20130830
Implantable Lead Location: 753859
Implantable Lead Location: 753860
Implantable Lead Model: 4456
Implantable Lead Model: 4479
Implantable Lead Serial Number: 473325
Implantable Lead Serial Number: 523784
Implantable Pulse Generator Implant Date: 20130830
Lead Channel Impedance Value: 443 Ohm
Lead Channel Impedance Value: 608 Ohm
Lead Channel Pacing Threshold Amplitude: 0.7 V
Lead Channel Pacing Threshold Pulse Width: 0.5 ms
Lead Channel Setting Pacing Amplitude: 2 V
Lead Channel Setting Pacing Amplitude: 2.4 V
Lead Channel Setting Pacing Pulse Width: 0.5 ms
Lead Channel Setting Sensing Sensitivity: 2.5 mV
Pulse Gen Serial Number: 134132

## 2019-10-11 NOTE — Progress Notes (Signed)
Remote pacemaker transmission.   

## 2019-10-11 NOTE — Telephone Encounter (Signed)
Discussed with patient regarding near ERI. Patient verbalizes understanding. Advised patient to call back if she has any further questions or concerns.

## 2019-10-11 NOTE — Telephone Encounter (Signed)
Follow up:     Patient calling to get a better understanding about her pacer maker that she thinks something is going on. Please call patient back. [phone]

## 2019-10-20 ENCOUNTER — Other Ambulatory Visit: Payer: Self-pay | Admitting: Pulmonary Disease

## 2019-11-08 ENCOUNTER — Telehealth: Payer: Self-pay

## 2019-11-08 NOTE — Telephone Encounter (Signed)
Pt notified form ready for pick up 

## 2019-11-08 NOTE — Telephone Encounter (Signed)
Done and in IN box 

## 2019-11-08 NOTE — Telephone Encounter (Signed)
In your inbox for review

## 2019-11-08 NOTE — Telephone Encounter (Signed)
Patient would like Dr. Glori Bickers to request a handicap sticker on her behalf. Would like a call once form is ready for pick-up.

## 2019-11-10 ENCOUNTER — Ambulatory Visit (INDEPENDENT_AMBULATORY_CARE_PROVIDER_SITE_OTHER): Payer: Medicare Other | Admitting: *Deleted

## 2019-11-10 DIAGNOSIS — I442 Atrioventricular block, complete: Secondary | ICD-10-CM | POA: Diagnosis not present

## 2019-11-10 LAB — CUP PACEART REMOTE DEVICE CHECK
Battery Remaining Longevity: 3 mo — CL
Battery Remaining Percentage: 2 %
Brady Statistic RA Percent Paced: 5 %
Brady Statistic RV Percent Paced: 100 %
Date Time Interrogation Session: 20210916041100
Implantable Lead Implant Date: 20130830
Implantable Lead Implant Date: 20130830
Implantable Lead Location: 753859
Implantable Lead Location: 753860
Implantable Lead Model: 4456
Implantable Lead Model: 4479
Implantable Lead Serial Number: 473325
Implantable Lead Serial Number: 523784
Implantable Pulse Generator Implant Date: 20130830
Lead Channel Impedance Value: 437 Ohm
Lead Channel Impedance Value: 606 Ohm
Lead Channel Pacing Threshold Amplitude: 0.7 V
Lead Channel Pacing Threshold Pulse Width: 0.5 ms
Lead Channel Setting Pacing Amplitude: 2 V
Lead Channel Setting Pacing Amplitude: 2.4 V
Lead Channel Setting Pacing Pulse Width: 0.5 ms
Lead Channel Setting Sensing Sensitivity: 2.5 mV
Pulse Gen Serial Number: 134132

## 2019-11-14 NOTE — Progress Notes (Signed)
Remote pacemaker transmission.   

## 2019-11-16 ENCOUNTER — Other Ambulatory Visit: Payer: Self-pay

## 2019-11-16 ENCOUNTER — Ambulatory Visit (INDEPENDENT_AMBULATORY_CARE_PROVIDER_SITE_OTHER): Payer: Medicare Other | Admitting: Internal Medicine

## 2019-11-16 ENCOUNTER — Encounter: Payer: Self-pay | Admitting: Internal Medicine

## 2019-11-16 ENCOUNTER — Other Ambulatory Visit: Payer: Self-pay | Admitting: Internal Medicine

## 2019-11-16 VITALS — BP 100/62 | HR 72 | Ht 68.0 in | Wt 176.0 lb

## 2019-11-16 DIAGNOSIS — I2510 Atherosclerotic heart disease of native coronary artery without angina pectoris: Secondary | ICD-10-CM | POA: Diagnosis not present

## 2019-11-16 DIAGNOSIS — I5032 Chronic diastolic (congestive) heart failure: Secondary | ICD-10-CM | POA: Diagnosis not present

## 2019-11-16 DIAGNOSIS — J9611 Chronic respiratory failure with hypoxia: Secondary | ICD-10-CM

## 2019-11-16 DIAGNOSIS — E785 Hyperlipidemia, unspecified: Secondary | ICD-10-CM | POA: Diagnosis not present

## 2019-11-16 DIAGNOSIS — I442 Atrioventricular block, complete: Secondary | ICD-10-CM | POA: Diagnosis not present

## 2019-11-16 NOTE — Progress Notes (Signed)
Follow-up Outpatient Visit Date: 11/16/2019  Primary Care Provider: Abner Greenspan, MD [place] [address]  Chief Complaint: Follow-up coronary artery disease and shortness of breath  HPI:  Diana Cook is a 69 y.o. female with history of moderate coronary artery disease by cardiac CTA in 11/2018, complete heart block status post permanent pacemaker followed by Dr. Caryl Comes, seizure disorder, COPD, and right breast cancer, who presents for follow-up of shortness of breath.  I last saw her in June, which time he reported stable exertional dyspnea attributed to HFpEF based on mildly elevated LVEDP at the time of catheterization.  We agreed to continue with furosemide 40 mg daily.  No other medication changes were made.  Today, Diana Cook reports that she is doing relatively well.  She has experienced 2-3 episodes of chest pain while at rest since our last visit.  The episodes have lasted 15 to 20 minutes.  Pain usually improves with acetaminophen.  She has stable exertional dyspnea when walking uphill or for extended periods.  She is wearing 2.5 to 3 L of supplemental oxygen at night but none during the day.  She has not had any palpitations or lightheadedness.  She has intermittent dependent edema, as well as occasional nausea after taking her morning medications.  --------------------------------------------------------------------------------------------------  Past Medical History:  Diagnosis Date   Allergic rhinitis    Arthritis    Asthma    as a child, mild now   Breast cancer ([place]) 12/2015   right breast cancer, lumpectomy and mammosite    Colon polyps    colonoscopy 7/08, tubular adenoma   Complete heart block ([place]) 8/13   s/p PPM implanted in [place] [facility]   COPD (chronic obstructive pulmonary disease) ([place])    Myocardial infarction ([place]) 2011   Osteopenia 10/2015   Pacemaker    2011   Personal history of radiation therapy 2017   right breast ca,  mammosite placed   Seizure disorder ([place])    Seizures ([place])    first one was when she was 69 years old    Small bowel obstruction ([place])    1988 and 2002   Tobacco abuse    Past Surgical History:  Procedure Laterality Date   ABDOMINAL HYSTERECTOMY     BREAST BIOPSY Right 2007   benign inflammatory changes, mass due to underwire bra   BREAST BIOPSY Left 01/02/2016   columnar cell changes without atypical hyperplasia.   BREAST BIOPSY Right 12/06/2015   rt breast mass 10:00, bx done at Dr. Curly Shores office, invasive ductal carcinoma   BREAST EXCISIONAL BIOPSY Left 01/02/2016   COLUMNAR CELL CHANGE AND HYPERPLASIA ASSOCIATED WITH LUMINAL AND STROMAL CALCIFICATIONS   BREAST LUMPECTOMY Right 01/02/2016   invasive mammary carcinoma, clear margins, negative LN   BREAST LUMPECTOMY WITH SENTINEL LYMPH NODE BIOPSY Right 01/02/2016   pT1c, N0; ER/ PR 100%; Her 2 neu not over expressed: BREAST LUMPECTOMY WITH SENTINEL LYMPH NODE BX;  Surgeon: [name], MD;  Location: [facility];  Service: General;  Laterality: Right;   CARDIAC CATHETERIZATION     CATARACT EXTRACTION Bilateral    COLONOSCOPY  10/2015   Dr Ardis Hughs   COLONOSCOPY WITH PROPOFOL N/A 08/19/2018   Procedure: COLONOSCOPY WITH PROPOFOL;  Surgeon: Virgel Manifold, MD;  Location: [facility] END[facility];  Service: Endoscopy;  Laterality: N/A;   ESOPHAGOGASTRODUODENOSCOPY (EGD) WITH PROPOFOL N/A 08/19/2018   Procedure: ESOPHAGOGASTRODUODENOSCOPY (EGD) WITH PROPOFOL;  Surgeon: Virgel Manifold, MD;  Location: [facility] END[facility];  Service: Endoscopy;  Laterality: N/A;   EXPLORATORY LAPAROTOMY  01/25/2001   Exploratory laparotomy, lysis of adhesions, identification of internal hernia secondary to omental adhesion. Prolonged postoperative ileus.   gyn surgery  1993   hysterectomy- form endometriosis   LAPAROSCOPY     PACEMAKER INSERTION  10/24/11   [employer] Advantio dual chamber PPM implanted by Dr Bunnie Philips at [facility] in [place]   RIGHT/LEFT HEART [place] Bilateral 07/19/2019   Procedure: RIGHT/LEFT HEART CATH AND CORONARY ANGIOGRAPHY;  Surgeon: Nelva Bush, MD;  Location: [facility];  Service: Cardiovascular;  Laterality: Bilateral;    Current Meds  Medication Sig   acetaminophen (TYLENOL) 500 MG tablet Take 1,000 mg by mouth every 6 (six) hours as needed for mild pain or headache.    albuterol (VENTOLIN HFA) 108 (90 Base) MCG/ACT inhaler Inhale 2 puffs into the lungs every 4 (four) hours as needed for wheezing or shortness of breath.   aspirin EC 81 MG tablet Take 81 mg by mouth daily. Swallow whole.   atorvastatin (LIPITOR) 40 MG tablet Take 40 mg by mouth at bedtime.   atorvastatin (LIPITOR) 40 MG tablet Take 40 mg by mouth daily.   Calcium Carb-Cholecalciferol (CALCIUM CARBONATE-VITAMIN D3 PO) Take by mouth.   Calcium-Vitamin D-Vitamin K 974-163-84 MG-UNT-MCG TABS Take 1 tablet by mouth 2 (two) times daily.    famotidine (PEPCID) 20 MG tablet Take 1 tablet (20 mg total) by mouth daily. **PLEASE SCHEDULE A FOLLOW UP APPT**   FLUoxetine (PROZAC) 20 MG capsule Take 1 capsule (20 mg total) by mouth daily.   furosemide (LASIX) 40 MG tablet Take 1 tablet by mouth once daily   levETIRAcetam (KEPPRA) 250 MG tablet    levETIRAcetam (KEPPRA) 500 MG tablet Take 500 mg by mouth daily.    OXYGEN Inhale 3 L into the lungs at bedtime.    STIOLTO RESPIMAT 2.5-2.5 MCG/ACT AERS INHALE 2 PUFFS BY MOUTH ONCE DAILY   zolpidem (AMBIEN) 10 MG tablet TAKE 1/2 TO 1 (ONE-HALF TO ONE) TABLET BY MOUTH AT BEDTIME AS NEEDED FOR SLEEP    Allergies: Codeine and Morphine and related  Social History   Tobacco Use   Smoking status: Former Smoker    Packs/day: 1.00    Years: 42.00    Pack years: 42.00    Types: Cigarettes    Quit date: 11/11/2010    Years since quitting: 9.0   Smokeless tobacco: Never Used  Vaping Use   Vaping Use: Never  used  Substance Use Topics   Alcohol use: Yes    Alcohol/week: 4.0 standard drinks    Types: 4 Glasses of wine per week    Comment: weekly   Drug use: No    Family History  Problem Relation Age of Onset   Stroke Mother    Heart disease Mother 81       MI and CABG   Dementia Mother    Coronary artery disease Father    Parkinsonism Father    Cancer - Cervical Daughter 66       died 2022-04-02   Heart attack Brother 44   Colon cancer Neg Hx    Breast cancer Neg Hx     Review of Systems: A 12-system review of systems was performed and was negative except as noted in the HPI.  --------------------------------------------------------------------------------------------------  Physical Exam: BP 100/62 (BP Location: Left Arm, Patient Position: Sitting, Cuff Size: Normal)    Pulse 72  Ht 5\' 8"  (1.727 m)    Wt 176 lb (79.8 kg)    SpO2 92%    BMI 26.76 kg/m   General: NAD.  Accompanied by her husband. HEENT: No conjunctival pallor or scleral icterus. Facemask in place. Neck: No JVD or HJR. Lungs: Mildly diminished breath sounds throughout without wheezes or crackles. Heart: Regular rate and rhythm without murmurs, rubs, or gallops. Abd: Bowel sounds present. Soft, NT/ND. Ext: No lower extremity edema. Radial, PT, and DP pulses are 2+ bilaterally.  EKG: Normal sinus rhythm with atrially sensed and ventricularly paced rhythm.  Lab Results  Component Value Date   WBC 6.1 07/08/2019   HGB 13.7 07/08/2019   HCT 42.1 07/08/2019   MCV 90.0 07/08/2019   PLT 276 07/08/2019    Lab Results  Component Value Date   NA 137 08/10/2019   K 4.2 08/10/2019   CL 100 08/10/2019   CO2 21 08/10/2019   BUN 25 08/10/2019   CREATININE 1.00 08/10/2019   GLUCOSE 105 (H) 08/10/2019   ALT 19 12/28/2018    Lab Results  Component Value Date   CHOL 205 (H) 12/28/2018   HDL 63.30 12/28/2018   LDLCALC 124 (H) 12/28/2018   TRIG 90.0 12/28/2018   CHOLHDL 3 12/28/2018     --------------------------------------------------------------------------------------------------  ASSESSMENT AND PLAN: Nonobstructive coronary artery disease: Stable symptoms with chronic exertional dyspnea driven primarily by underlying lung disease as well as atypical chest pain.  We will continue current medications to prevent progression of nonobstructive coronary artery disease noted on catheterization earlier this year.  Chronic respiratory failure with hypoxia and chronic HFpEF: This is most likely driven by underlying lung disease, though a contribution of chronic HFpEF is likely present.  Ms. Riedlinger appears euvolemic on exam today.  Continue ongoing management of lung disease per Dr. Patsey Berthold in the pulmonary clinic.  We will also continue with furosemide 40 mg daily for volume management.  Complete heart block: EKG shows sinus rhythm with atrially sensed and ventricularly paced rhythm.  Continue routine device follow-up through EP.  Hyperlipidemia: We will check a CMP and lipid panel today to ensure appropriate response to atorvastatin.  Target LDL remains less than 70.  Follow-up: Return to clinic in 6 months.  Nelva Bush, MD 11/16/2019 1:58 PM

## 2019-11-16 NOTE — Patient Instructions (Signed)
Medication Instructions:  Your physician recommends that you continue on your current medications as directed. Please refer to the Current Medication list given to you today.  *If you need a refill on your cardiac medications before your next appointment, please call your pharmacy*  Lab Work: Your physician recommends that you return for lab work in: [place], LIPID.  If you have labs (blood work) drawn today and your tests are completely normal, you will receive your results only by: Marland Kitchen MyChart Message (if you have MyChart) OR . A paper copy in the mail If you have any lab test that is abnormal or we need to change your treatment, we will call you to review the results.  Testing/Procedures: none  Follow-Up: At [facility], you and your health needs are our priority.  As part of our continuing mission to provide you with exceptional heart care, we have created designated Provider Care Teams.  These Care Teams include your primary Cardiologist (physician) and Advanced Practice Providers (APPs -  Physician Assistants and Nurse Practitioners) who all work together to provide you with the care you need, when you need it.  We recommend signing up for the patient portal called "MyChart".  Sign up information is provided on this After Visit Summary.  MyChart is used to connect with patients for Virtual Visits (Telemedicine).  Patients are able to view lab/test results, encounter notes, upcoming appointments, etc.  Non-urgent messages can be sent to your provider as well.   To learn more about what you can do with MyChart, go to [email or web address].    Your next appointment:   6 month(s)  The format for your next appointment:   In Person  Provider:   You may see DR Harrell Gave END or one of the following Advanced Practice Providers on your designated Care Team:    Murray Hodgkins, NP  Christell Faith, PA-C  Marrianne Mood, PA-C  [place], [place]

## 2019-11-17 ENCOUNTER — Encounter: Payer: Self-pay | Admitting: Internal Medicine

## 2019-11-17 DIAGNOSIS — J9611 Chronic respiratory failure with hypoxia: Secondary | ICD-10-CM | POA: Insufficient documentation

## 2019-11-17 LAB — COMPREHENSIVE METABOLIC PANEL
ALT: 15 IU/L (ref 0–32)
AST: 21 IU/L (ref 0–40)
Albumin/Globulin Ratio: 2 (ref 1.2–2.2)
Albumin: 4.7 g/dL (ref 3.7–4.7)
Alkaline Phosphatase: 88 IU/L (ref 44–121)
BUN/Creatinine Ratio: 26 (ref 12–28)
BUN: 28 mg/dL — ABNORMAL HIGH (ref 8–27)
Bilirubin Total: 0.5 mg/dL (ref 0.0–1.2)
CO2: 23 mmol/L (ref 20–29)
Calcium: 10.3 mg/dL (ref 8.7–10.3)
Chloride: 101 mmol/L (ref 96–106)
Creatinine, Ser: 1.08 mg/dL — ABNORMAL HIGH (ref 0.57–1.00)
GFR calc Af Amer: 60 mL/min/{1.73_m2} (ref >59)
GFR calc non Af Amer: 52 mL/min/{1.73_m2} — ABNORMAL LOW (ref >59)
Globulin, Total: 2.3 g/dL (ref 1.5–4.5)
Glucose: 105 mg/dL — ABNORMAL HIGH (ref 65–99)
Potassium: 4.4 mmol/L (ref 3.5–5.2)
Sodium: 139 mmol/L (ref 134–144)
Total Protein: 7 g/dL (ref 6.0–8.5)

## 2019-11-17 LAB — LIPID PANEL
Chol/HDL Ratio: 2.4 ratio (ref 0.0–4.4)
Cholesterol, Total: 136 mg/dL (ref 100–199)
HDL: 57 mg/dL (ref >39)
LDL Chol Calc (NIH): 59 mg/dL (ref 0–99)
Triglycerides: 109 mg/dL (ref 0–149)
VLDL Cholesterol Cal: 20 mg/dL (ref 5–40)

## 2019-11-18 ENCOUNTER — Telehealth: Payer: Self-pay | Admitting: *Deleted

## 2019-11-18 MED ORDER — FUROSEMIDE 40 MG PO TABS: 40.0000 mg | ORAL_TABLET | Freq: Every day | ORAL | 3 refills | Status: DC | PRN

## 2019-11-18 NOTE — Telephone Encounter (Signed)
The patient has been notified of the result and verbalized understanding.  All questions (if any) were answered. Parker, RN 11/18/2019 10:14 AM  Med list updated.

## 2019-11-18 NOTE — Telephone Encounter (Signed)
-----   Message from Nelva Bush, MD sent at 11/18/2019  8:24 AM EDT ----- Please let Ms. Eckersley know that her liver function and electrolytes are relatively stable.  Creatinine has increased slightly, suggesting there may be a slight decline in kidney function.  This can be seen with diuretics such as furosemide.  I recommend that we change furosemide to 40 mg daily as needed for weight gain or edema.  Cholesterol is very well controlled with addition of atorvastatin.  I recommend that Ms. Freundlich continue current dose of this medication.  We'll follow-up as previously arranged.

## 2019-11-21 ENCOUNTER — Telehealth: Payer: Self-pay | Admitting: Student

## 2019-11-21 NOTE — Telephone Encounter (Signed)
Alert received for ERI as of 9/25.     Pt needs appointment with Dr. Caryl Comes to discuss gen change.   LMOM to CB to let her know.   Legrand Como [address]" [place], PA-C  11/21/2019 9:21 AM

## 2019-11-21 NOTE — Telephone Encounter (Signed)
The pt returned the PA phone call. I let her speak with Doylene Canning to get an appointment with Caryl Comes.

## 2019-11-22 DIAGNOSIS — Z23 Encounter for immunization: Secondary | ICD-10-CM | POA: Diagnosis not present

## 2019-12-01 ENCOUNTER — Other Ambulatory Visit: Payer: Self-pay

## 2019-12-01 ENCOUNTER — Other Ambulatory Visit: Payer: Self-pay | Admitting: General Surgery

## 2019-12-01 ENCOUNTER — Other Ambulatory Visit
Admission: RE | Admit: 2019-12-01 | Discharge: 2019-12-01 | Disposition: A | Discharge status: Home / Self Care | Payer: Medicare Other | Source: Ambulatory Visit | Attending: Internal Medicine | Admitting: Internal Medicine

## 2019-12-01 ENCOUNTER — Encounter: Payer: Self-pay | Admitting: *Deleted

## 2019-12-01 ENCOUNTER — Other Ambulatory Visit
Admission: RE | Admit: 2019-12-01 | Discharge: 2019-12-01 | Disposition: A | Discharge status: Home / Self Care | Payer: Medicare Other | Attending: Internal Medicine | Admitting: Internal Medicine

## 2019-12-01 ENCOUNTER — Ambulatory Visit (INDEPENDENT_AMBULATORY_CARE_PROVIDER_SITE_OTHER): Payer: Medicare Other | Admitting: Internal Medicine

## 2019-12-01 VITALS — BP 102/66 | HR 63 | Ht 68.0 in | Wt 183.0 lb

## 2019-12-01 DIAGNOSIS — Z20822 Contact with and (suspected) exposure to covid-19: Secondary | ICD-10-CM | POA: Insufficient documentation

## 2019-12-01 DIAGNOSIS — Z01812 Encounter for preprocedural laboratory examination: Secondary | ICD-10-CM

## 2019-12-01 DIAGNOSIS — I951 Orthostatic hypotension: Secondary | ICD-10-CM | POA: Diagnosis not present

## 2019-12-01 DIAGNOSIS — Z950 Presence of cardiac pacemaker: Secondary | ICD-10-CM | POA: Diagnosis not present

## 2019-12-01 DIAGNOSIS — I2510 Atherosclerotic heart disease of native coronary artery without angina pectoris: Secondary | ICD-10-CM | POA: Diagnosis not present

## 2019-12-01 DIAGNOSIS — I442 Atrioventricular block, complete: Secondary | ICD-10-CM | POA: Insufficient documentation

## 2019-12-01 DIAGNOSIS — Z170 Estrogen receptor positive status [ER+]: Secondary | ICD-10-CM

## 2019-12-01 LAB — CBC WITH DIFFERENTIAL/PLATELET
Abs Immature Granulocytes: 0.06 10*3/uL (ref 0.00–0.07)
Basophils Absolute: 0 10*3/uL (ref 0.0–0.1)
Basophils Relative: 1 %
Eosinophils Absolute: 0.3 10*3/uL (ref 0.0–0.5)
Eosinophils Relative: 4 %
HCT: 40.8 % (ref 36.0–46.0)
Hemoglobin: 13.5 g/dL (ref 12.0–15.0)
Immature Granulocytes: 1 %
Lymphocytes Relative: 19 %
Lymphs Abs: 1.3 10*3/uL (ref 0.7–4.0)
MCH: 29.7 pg (ref 26.0–34.0)
MCHC: 33.1 g/dL (ref 30.0–36.0)
MCV: 89.7 fL (ref 80.0–100.0)
Monocytes Absolute: 0.6 10*3/uL (ref 0.1–1.0)
Monocytes Relative: 9 %
Neutro Abs: 4.5 10*3/uL (ref 1.7–7.7)
Neutrophils Relative %: 66 %
Platelets: 262 10*3/uL (ref 150–400)
RBC: 4.55 MIL/uL (ref 3.87–5.11)
RDW: 13.2 % (ref 11.5–15.5)
WBC: 6.8 10*3/uL (ref 4.0–10.5)
nRBC: 0 % (ref 0.0–0.2)

## 2019-12-01 LAB — BASIC METABOLIC PANEL
Anion gap: 10 (ref 5–15)
BUN: 20 mg/dL (ref 8–23)
CO2: 26 mmol/L (ref 22–32)
Calcium: 9.9 mg/dL (ref 8.9–10.3)
Chloride: 100 mmol/L (ref 98–111)
Creatinine, Ser: 0.88 mg/dL (ref 0.44–1.00)
GFR calc non Af Amer: >60 mL/min (ref >60)
Glucose, Bld: 100 mg/dL — ABNORMAL HIGH (ref 70–99)
Potassium: 4.1 mmol/L (ref 3.5–5.1)
Sodium: 136 mmol/L (ref 135–145)

## 2019-12-01 LAB — CORTISOL: Cortisol, Plasma: 8.6 ug/dL

## 2019-12-01 NOTE — Patient Instructions (Signed)
Medication Instructions:  -  Your physician recommends that you continue on your current medications as directed. Please refer to the Current Medication list given to you today.  *If you need a refill on your cardiac medications before your next appointment, please call your pharmacy*   Lab Work:  1) Pre- procedure lab work: today- BMP/ CBC/ Cortisol level/ Serum protein electrophoresis (S-PEP) - Nature conservation officer at [facility] - 1st desk on the right to check in   2) Pre procedure COVID swab: today - [facility] Entr[facility] at [facility] - drive up testing only, staff will come out to the car to swab you   If you have labs (blood work) drawn today and your tests are completely normal, you will receive your results only by: Marland Kitchen MyChart Message (if you have MyChart) OR . A paper copy in the mail If you have any lab test that is abnormal or we need to change your treatment, we will call you to review the results.   Testing/Procedures: - Your physician has recommended that you have a pacemaker generator (battery) change out.   - see attached instructions   Follow-Up: At [facility], you and your health needs are our priority.  As part of our continuing mission to provide you with exceptional heart care, we have created designated Provider Care Teams.  These Care Teams include your primary Cardiologist (physician) and Advanced Practice Providers (APPs -  Physician Assistants and Nurse Practitioners) who all work together to provide you with the care you need, when you need it.  We recommend signing up for the patient portal called "MyChart".  Sign up information is provided on this After Visit Summary.  MyChart is used to connect with patients for Virtual Visits (Telemedicine).  Patients are able to view lab/test results, encounter notes, upcoming appointments, etc.  Non-urgent messages can be sent to your provider as well.   To learn more about what you can do with MyChart, go to  [email or web address].    Your next appointment:   1) 10-14 days (from 10/11/221) with the [facility] in [place] for a wound check  2) 91 days (from 12/05/19) with Dr. Caryl Comes in the [facility] office  The format for your next appointment:   In Person  Provider:   as above   Other Instructions

## 2019-12-01 NOTE — H&P (View-Only) (Signed)
Patient Care Team: Tower, Wynelle Fanny, MD as PCP - General Deboraha Sprang, MD as Consulting Physician (Cardiology) Lorelee Cover., MD as Referring Physician (Ophthalmology) Tower, Wynelle Fanny, MD as Consulting Physician (Family Medicine) Bary Castilla Forest Gleason, MD (General Surgery)   HPI  Diana Cook is a 69 y.o. female Seen by both Drs. Allred and Lovena Le in the past who developed complete heart block and underwent pacing fall 2013 [facility] Catheterization reportedly demonstrated no coronary artery disease  Echocardiogram 9/13 demonstrated normal left ventricular ; this was repeated 9/14 with normal LV function    Some chest pains not related to exertion.  Typically lasts about a minute.  Dyspnea with exertion continues.  Intercurrently diagnosed with COPD attributed to long-term smoking.   DATE TEST EF   9/13 Echo   55-65 %   12/14 Echo   55-65 %   8/18 Echo  55-60%   7/19 Echo  50-55%       Her daughter a year ago developed cancer. Side effects from chemotherapy at left her bedbound for the last year She died 04/05/2022; devastating.  She meets with her grandchildren regularly for breakfast.     Date Cr K  8//18 0.83 4.4   10/19 0.95 4.5     Past Medical History:  Diagnosis Date  . Allergic rhinitis   . Arthritis   . Asthma    as a child, mild now  . Breast cancer ([place]) 12/2015   right breast cancer, lumpectomy and mammosite   . Colon polyps    colonoscopy 7/08, tubular adenoma  . Complete heart block ([place]) 8/13   s/p PPM implanted in [facility]  . COPD (chronic obstructive pulmonary disease) ([place])   . Myocardial infarction ([place]) 2011  . Osteopenia 10/2015  . Pacemaker    2011  . Personal history of radiation therapy 2017   right breast ca, mammosite placed  . Seizure disorder ([place])   . Seizures ([place])    first one was when she was 69 years old   . Small bowel obstruction ([place])    1988 and 2002  . Tobacco abuse     Past Surgical History:    Procedure Laterality Date  . ABDOMINAL HYSTERECTOMY    . BREAST BIOPSY Right 2007   benign inflammatory changes, mass due to underwire bra  . BREAST BIOPSY Left 01/02/2016   columnar cell changes without atypical hyperplasia.  Marland Kitchen BREAST BIOPSY Right 12/06/2015   rt breast mass 10:00, bx done at Dr. Curly Shores office, invasive ductal carcinoma  . BREAST EXCISIONAL BIOPSY Left 01/02/2016   COLUMNAR CELL CHANGE AND HYPERPLASIA ASSOCIATED WITH LUMINAL AND STROMAL CALCIFICATIONS  . BREAST LUMPECTOMY Right 01/02/2016   invasive mammary carcinoma, clear margins, negative LN  . BREAST LUMPECTOMY WITH SENTINEL LYMPH NODE BIOPSY Right 01/02/2016   pT1c, N0; ER/ PR 100%; Her 2 neu not over expressed: BREAST LUMPECTOMY WITH SENTINEL LYMPH NODE BX;  Surgeon: [name], MD;  Location: [facility] OR[facility];  Service: General;  Laterality: Right;  . CARDIAC CATHETERIZATION    . CATARACT EXTRACTION Bilateral   . COLONOSCOPY  10/2015   Dr Ardis Hughs  . COLONOSCOPY WITH PROPOFOL N/A 08/19/2018   Procedure: COLONOSCOPY WITH PROPOFOL;  Surgeon: Virgel Manifold, MD;  Location: [facility];  Service: Endoscopy;  Laterality: N/A;  . ESOPHAGOGASTRODUODENOSCOPY (EGD) WITH PROPOFOL N/A 08/19/2018   Procedure: ESOPHAGOGASTRODUODENOSCOPY (EGD) WITH PROPOFOL;  Surgeon: Virgel Manifold, MD;  Location: [facility];  Service: Endoscopy;  Laterality: N/A;  . EXPLORATORY LAPAROTOMY  01/25/2001   Exploratory laparotomy, lysis of adhesions, identification of internal hernia secondary to omental adhesion. Prolonged postoperative ileus.  Marland Kitchen gyn surgery  1993   hysterectomy- form endometriosis  . LAPAROSCOPY    . PACEMAKER INSERTION  10/24/11   [employer] Advantio dual chamber PPM implanted by Dr Bunnie Philips at [facility] in [place]  . RIGHT/LEFT HEART CATH AND CORONARY ANGIOGRAPHY Bilateral 07/19/2019   Procedure: RIGHT/LEFT HEART CATH AND CORONARY ANGIOGRAPHY;  Surgeon: Nelva Bush, MD;   Location: [facility];  Service: Cardiovascular;  Laterality: Bilateral;    Current Outpatient Medications  Medication Sig Dispense Refill  . acetaminophen (TYLENOL) 500 MG tablet Take 1,000 mg by mouth every 6 (six) hours as needed for mild pain or headache.     . albuterol (VENTOLIN HFA) 108 (90 Base) MCG/ACT inhaler Inhale 2 puffs into the lungs every 4 (four) hours as needed for wheezing or shortness of breath. 18 g 1  . aspirin EC 81 MG tablet Take 81 mg by mouth daily. Swallow whole.    Marland Kitchen atorvastatin (LIPITOR) 40 MG tablet Take 40 mg by mouth daily.    . Calcium Carb-Cholecalciferol (CALCIUM CARBONATE-VITAMIN D3 PO) Take by mouth.    . Calcium-Vitamin D-Vitamin K 627-035-00 MG-UNT-MCG TABS Take 1 tablet by mouth 2 (two) times daily.     . famotidine (PEPCID) 20 MG tablet Take 1 tablet (20 mg total) by mouth daily. **PLEASE SCHEDULE A FOLLOW UP APPT** 30 tablet 1  . FLUoxetine (PROZAC) 20 MG capsule Take 1 capsule (20 mg total) by mouth daily. 90 capsule 3  . furosemide (LASIX) 40 MG tablet Take 1 tablet (40 mg total) by mouth daily as needed for edema (For weight gain or swelling.). 90 tablet 3  . levETIRAcetam (KEPPRA) 250 MG tablet     . levETIRAcetam (KEPPRA) 500 MG tablet Take 500 mg by mouth daily.     . OXYGEN Inhale 3 L into the lungs at bedtime.     Marland Kitchen STIOLTO RESPIMAT 2.5-2.5 MCG/ACT AERS INHALE 2 PUFFS BY MOUTH ONCE DAILY 4 g 5  . zolpidem (AMBIEN) 10 MG tablet TAKE 1/2 TO 1 (ONE-HALF TO ONE) TABLET BY MOUTH AT BEDTIME AS NEEDED FOR SLEEP 30 tablet 5   No current facility-administered medications for this visit.    Allergies  Allergen Reactions  . Codeine Nausea And Vomiting    REACTION: nausea  . Morphine And Related Nausea Only    Nausea     Review of Systems negative except from HPI and PMH  Physical Exam BP 102/66   Pulse 63   Ht 5\' 8"  (1.727 m)   Wt 183 lb (83 kg)   BMI 27.83 kg/m  Well developed and well nourished in no acute distress HENT  normal Neck supple with JVP-flat Clear Device pocket well healed; without hematoma or erythema.  There is no tethering  Regular rate and rhythm, no  gallop No  murmur Abd-soft with active BS No Clubbing cyanosis tr edema Skin-warm and dry A & Oriented  Grossly normal sensory and motor function  ECG P-synchronous/ AV  pacing     Assessment and  Plan  High-grade heart block  Pacemaker-[employer] device at  [facility]     Sinus node dysfunction and chronotropic incompetence   Exercise intolerance  Orthostatic hypotension or presyncope    The patient's device has reached ERI.  We have discussed generator replacement with potential  risks including lead fracture and infection.  Given the concerns about [employer] leads in patients with complete heart block we will plan a temporary transvenous pacemaker.  Her orthostatic hypotension is impressive.  Review of the relationship to Her demonstrates that most people for the problem (less than 1%) occur in the first month of therapy which correlates with the onset of her symptoms.  I have asked her to follow-up with her neurologist about this.  In the interim, given her relatively low blood pressure, we will have her increase her sodium and fluid intake,  We discussed the physiology of orthostatic intolerance including gravitational fluid shifts and the impact of hypertensive vascular disease on orthostasis and treatment options.  We discussed pharmacological options including midodrine, pyridostigmine, fludrocortisone.  We discussed nonpharmacological options including raising the HOB, isometric contraction upon standing, abdominal binders  thigh sleeves. We emphasized the importance of recognizing the prodrome and sitting prior to falling, safety in the shower and in the bathroom and the avoidance of dehydration   We will begin with a nonpharmacological approach and will use compression thighs and abdomen to try to mitigate  orthostasis.  We will check a cortisol level.  I think the likelihood is low for amyloid but we will check an SPEP.  She may benefit from PYP scanning.

## 2019-12-01 NOTE — Progress Notes (Signed)
Patient Care Team: Tower, Wynelle Fanny, MD as PCP - General Deboraha Sprang, MD as Consulting Physician (Cardiology) Lorelee Cover., MD as Referring Physician (Ophthalmology) Tower, Wynelle Fanny, MD as Consulting Physician (Family Medicine) Bary Castilla Forest Gleason, MD (General Surgery)   HPI  [facility] Diana Cook is a 69 y.o. female Seen by both Drs. Allred and Lovena Le in the past who developed complete heart block and underwent pacing fall 2013 [facility] Catheterization reportedly demonstrated no coronary artery disease  Echocardiogram 9/13 demonstrated normal left ventricular ; this was repeated 9/14 with normal LV function    Some chest pains not related to exertion.  Typically lasts about a minute.  Dyspnea with exertion continues.  Intercurrently diagnosed with COPD attributed to long-term smoking.   DATE TEST EF   9/13 Echo   55-65 %   12/14 Echo   55-65 %   8/18 Echo  55-60%   7/19 Echo  50-55%       Her daughter a year ago developed cancer. Side effects from chemotherapy at left her bedbound for the last year She died 04-05-2022; devastating.  She meets with her grandchildren regularly for breakfast.     Date Cr K  8//18 0.83 4.4   10/19 0.95 4.5     Past Medical History:  Diagnosis Date   Allergic rhinitis    Arthritis    Asthma    as a child, mild now   Breast cancer ([place]) 12/2015   right breast cancer, lumpectomy and mammosite    Colon polyps    colonoscopy 7/08, tubular adenoma   Complete heart block ([place]) 8/13   s/p PPM implanted in [place] [facility]   COPD (chronic obstructive pulmonary disease) ([place])    Myocardial infarction ([place]) 2011   Osteopenia 10/2015   Pacemaker    2011   Personal history of radiation therapy 2017   right breast ca, mammosite placed   Seizure disorder ([place])    Seizures ([place])    first one was when she was 69 years old    Small bowel obstruction ([place])    1988 and 2002   Tobacco abuse     Past Surgical History:   Procedure Laterality Date   ABDOMINAL HYSTERECTOMY     BREAST BIOPSY Right 2007   benign inflammatory changes, mass due to underwire bra   BREAST BIOPSY Left 01/02/2016   columnar cell changes without atypical hyperplasia.   BREAST BIOPSY Right 12/06/2015   rt breast mass 10:00, bx done at Dr. Curly [place] office, invasive ductal carcinoma   BREAST EXCISIONAL BIOPSY Left 01/02/2016   COLUMNAR CELL CHANGE AND HYPERPLASIA ASSOCIATED WITH LUMINAL AND STROMAL CALCIFICATIONS   BREAST LUMPECTOMY Right 01/02/2016   invasive mammary carcinoma, clear margins, negative LN   BREAST LUMPECTOMY WITH SENTINEL LYMPH NODE BIOPSY Right 01/02/2016   pT1c, N0; ER/ PR 100%; Her 2 neu not over expressed: BREAST LUMPECTOMY WITH SENTINEL LYMPH NODE BX;  Surgeon: [name], MD;  Location: [facility] ORS;  Service: General;  Laterality: Right;   CARDIAC CATHETERIZATION     CATARACT EXTRACTION Bilateral    COLONOSCOPY  10/2015   Dr Ardis Hughs   COLONOSCOPY WITH PROPOFOL N/A 08/19/2018   Procedure: COLONOSCOPY WITH PROPOFOL;  Surgeon: Virgel Manifold, MD;  Location: [facility] ENDOSCOPY;  Service: Endoscopy;  Laterality: N/A;   ESOPHAGOGASTRODUODENOSCOPY (EGD) WITH PROPOFOL N/A 08/19/2018   Procedure: ESOPHAGOGASTRODUODENOSCOPY (EGD) WITH PROPOFOL;  Surgeon: Virgel Manifold, MD;  Location: [facility] ENDOSCOPY;  Service:  Endoscopy;  Laterality: N/A;   EXPLORATORY LAPAROTOMY  01/25/2001   Exploratory laparotomy, lysis of adhesions, identification of internal hernia secondary to omental adhesion. Prolonged postoperative ileus.   gyn surgery  1993   hysterectomy- form endometriosis   LAPAROSCOPY     PACEMAKER INSERTION  10/24/11   [employer] Advantio dual chamber PPM implanted by Dr Bunnie Philips at [facility] in [place]   RIGHT/LEFT HEART Baker Bilateral 07/19/2019   Procedure: RIGHT/LEFT HEART CATH AND CORONARY ANGIOGRAPHY;  Surgeon: Nelva Bush, MD;   Location: [place] [facility];  Service: Cardiovascular;  Laterality: Bilateral;    Current Outpatient Medications  Medication Sig Dispense Refill   acetaminophen (TYLENOL) 500 MG tablet Take 1,000 mg by mouth every 6 (six) hours as needed for mild pain or headache.      albuterol (VENTOLIN HFA) 108 (90 Base) MCG/ACT inhaler Inhale 2 puffs into the lungs every 4 (four) hours as needed for wheezing or shortness of breath. 18 g 1   aspirin EC 81 MG tablet Take 81 mg by mouth daily. Swallow whole.     atorvastatin (LIPITOR) 40 MG tablet Take 40 mg by mouth daily.     Calcium Carb-Cholecalciferol (CALCIUM CARBONATE-VITAMIN D3 PO) Take by mouth.     Calcium-Vitamin D-Vitamin K 841-324-40 MG-UNT-MCG TABS Take 1 tablet by mouth 2 (two) times daily.      famotidine (PEPCID) 20 MG tablet Take 1 tablet (20 mg total) by mouth daily. **PLEASE SCHEDULE A FOLLOW UP APPT** 30 tablet 1   FLUoxetine (PROZAC) 20 MG capsule Take 1 capsule (20 mg total) by mouth daily. 90 capsule 3   furosemide (LASIX) 40 MG tablet Take 1 tablet (40 mg total) by mouth daily as needed for edema (For weight gain or swelling.). 90 tablet 3   levETIRAcetam (KEPPRA) 250 MG tablet      levETIRAcetam (KEPPRA) 500 MG tablet Take 500 mg by mouth daily.      OXYGEN Inhale 3 L into the lungs at bedtime.      STIOLTO RESPIMAT 2.5-2.5 MCG/ACT AERS INHALE 2 PUFFS BY MOUTH ONCE DAILY 4 g 5   zolpidem (AMBIEN) 10 MG tablet TAKE 1/2 TO 1 (ONE-HALF TO ONE) TABLET BY MOUTH AT BEDTIME AS NEEDED FOR SLEEP 30 tablet 5   No current facility-administered medications for this visit.    Allergies  Allergen Reactions   Codeine Nausea And Vomiting    REACTION: nausea   Morphine And Related Nausea Only    Nausea     Review of Systems negative except from HPI and PMH  Physical Exam BP 102/66    Pulse 63    Ht 5\' 8"  (1.727 m)    Wt 183 lb (83 kg)    BMI 27.83 kg/m  Well developed and well nourished in no acute distress HENT  normal Neck supple with JVP-flat Clear Device pocket well healed; without hematoma or erythema.  There is no tethering  Regular rate and rhythm, no  gallop No  murmur Abd-soft with active BS No Clubbing cyanosis tr edema Skin-warm and dry A & Oriented  Grossly normal sensory and motor function  ECG P-synchronous/ AV  pacing     Assessment and  Plan  High-grade heart block  Pacemaker-[employer] device at  [facility]     Sinus node dysfunction and chronotropic incompetence   Exercise intolerance  Orthostatic hypotension or presyncope    The patient's device has reached ERI.  We have discussed generator  replacement with potential risks including lead fracture and infection.  Given the concerns about [employer] leads in patients with complete heart block we will plan a temporary transvenous pacemaker.  Her orthostatic hypotension is impressive.  Review of the relationship to Her demonstrates that most people for the problem (less than 1%) occur in the first month of therapy which correlates with the onset of her symptoms.  I have asked her to follow-up with her neurologist about this.  In the interim, given her relatively low blood pressure, we will have her increase her sodium and fluid intake,  We discussed the physiology of orthostatic intolerance including gravitational fluid shifts and the impact of hypertensive vascular disease on orthostasis and treatment options.  We discussed pharmacological options including midodrine, pyridostigmine, fludrocortisone.  We discussed nonpharmacological options including raising the HOB, isometric contraction upon standing, abdominal binders  thigh sleeves. We emphasized the importance of recognizing the prodrome and sitting prior to falling, safety in the shower and in the bathroom and the avoidance of dehydration   We will begin with a nonpharmacological approach and will use compression thighs and abdomen to try to mitigate  orthostasis.  We will check a cortisol level.  I think the likelihood is low for amyloid but we will check an SPEP.  She may benefit from PYP scanning.

## 2019-12-02 LAB — SARS CORONAVIRUS 2 (TAT 6-24 HRS): SARS Coronavirus 2: NEGATIVE

## 2019-12-02 NOTE — Progress Notes (Signed)
Called to go over procedure instructions for Monday.  NO answer

## 2019-12-04 LAB — PROTEIN ELECTROPHORESIS, SERUM
A/G Ratio: 1.2 (ref 0.7–1.7)
Albumin ELP: 4.1 g/dL (ref 2.9–4.4)
Alpha-1-Globulin: 0.3 g/dL (ref 0.0–0.4)
Alpha-2-Globulin: 0.7 g/dL (ref 0.4–1.0)
Beta Globulin: 0.9 g/dL (ref 0.7–1.3)
Gamma Globulin: 1.5 g/dL (ref 0.4–1.8)
Globulin, Total: 3.3 g/dL (ref 2.2–3.9)
Total Protein ELP: 7.4 g/dL (ref 6.0–8.5)

## 2019-12-05 ENCOUNTER — Ambulatory Visit (HOSPITAL_COMMUNITY)
Admission: RE | Disposition: A | Discharge status: Home / Self Care | Payer: Medicare Other | Source: Home / Self Care | Attending: Internal Medicine

## 2019-12-05 ENCOUNTER — Ambulatory Visit (HOSPITAL_COMMUNITY)
Admission: RE | Admit: 2019-12-05 | Discharge: 2019-12-05 | Disposition: A | Discharge status: Home / Self Care | Payer: Medicare Other | Attending: Internal Medicine | Admitting: Internal Medicine

## 2019-12-05 DIAGNOSIS — Z45010 Encounter for checking and testing of cardiac pacemaker pulse generator [battery]: Secondary | ICD-10-CM | POA: Insufficient documentation

## 2019-12-05 DIAGNOSIS — G40909 Epilepsy, unspecified, not intractable, without status epilepticus: Secondary | ICD-10-CM | POA: Diagnosis not present

## 2019-12-05 DIAGNOSIS — I442 Atrioventricular block, complete: Secondary | ICD-10-CM

## 2019-12-05 DIAGNOSIS — Z923 Personal history of irradiation: Secondary | ICD-10-CM | POA: Diagnosis not present

## 2019-12-05 DIAGNOSIS — Z853 Personal history of malignant neoplasm of breast: Secondary | ICD-10-CM | POA: Diagnosis not present

## 2019-12-05 DIAGNOSIS — Z7982 Long term (current) use of aspirin: Secondary | ICD-10-CM | POA: Insufficient documentation

## 2019-12-05 DIAGNOSIS — J449 Chronic obstructive pulmonary disease, unspecified: Secondary | ICD-10-CM | POA: Insufficient documentation

## 2019-12-05 DIAGNOSIS — I252 Old myocardial infarction: Secondary | ICD-10-CM | POA: Insufficient documentation

## 2019-12-05 DIAGNOSIS — Z885 Allergy status to narcotic agent status: Secondary | ICD-10-CM | POA: Diagnosis not present

## 2019-12-05 DIAGNOSIS — I495 Sick sinus syndrome: Secondary | ICD-10-CM | POA: Diagnosis not present

## 2019-12-05 DIAGNOSIS — Z79899 Other long term (current) drug therapy: Secondary | ICD-10-CM | POA: Diagnosis not present

## 2019-12-05 DIAGNOSIS — R0609 Other forms of dyspnea: Secondary | ICD-10-CM | POA: Insufficient documentation

## 2019-12-05 HISTORY — PX: TEMPORARY PACEMAKER: CATH118268

## 2019-12-05 HISTORY — PX: PPM GENERATOR CHANGEOUT: EP1233

## 2019-12-05 SURGERY — PPM GENERATOR CHANGEOUT

## 2019-12-05 MED ORDER — SODIUM CHLORIDE 0.9 % IV SOLN: Freq: Once | INTRAVENOUS | Status: DC

## 2019-12-05 MED ORDER — CEFAZOLIN SODIUM-DEXTROSE 2-4 GM/100ML-% IV SOLN
INTRAVENOUS | Status: AC
  Filled 2019-12-05: qty 100

## 2019-12-05 MED ORDER — CEFAZOLIN SODIUM-DEXTROSE 2-4 GM/100ML-% IV SOLN
2.0000 g | INTRAVENOUS | Status: AC
  Administered 2019-12-05: 2 g via INTRAVENOUS

## 2019-12-05 MED ORDER — ACETAMINOPHEN 325 MG PO TABS: 325.0000 mg | ORAL_TABLET | ORAL | Status: DC | PRN

## 2019-12-05 MED ORDER — LIDOCAINE HCL (PF) 1 % IJ SOLN
INTRAMUSCULAR | Status: DC | PRN
  Administered 2019-12-05: 60 mL

## 2019-12-05 MED ORDER — FENTANYL CITRATE (PF) 100 MCG/2ML IJ SOLN
INTRAMUSCULAR | Status: AC
  Filled 2019-12-05: qty 2

## 2019-12-05 MED ORDER — SODIUM CHLORIDE 0.9 % IV SOLN
80.0000 mg | INTRAVENOUS | Status: AC
  Administered 2019-12-05: 80 mg

## 2019-12-05 MED ORDER — MIDAZOLAM HCL 5 MG/5ML IJ SOLN
INTRAMUSCULAR | Status: AC
  Filled 2019-12-05: qty 5

## 2019-12-05 MED ORDER — FENTANYL CITRATE (PF) 100 MCG/2ML IJ SOLN
INTRAMUSCULAR | Status: DC | PRN
Start: 2019-12-05 — End: 2019-12-05
  Administered 2019-12-05 (×2): 25 ug via INTRAVENOUS
  Administered 2019-12-05: 12.5 ug via INTRAVENOUS

## 2019-12-05 MED ORDER — SODIUM CHLORIDE 0.9 % IV SOLN: INTRAVENOUS | Status: DC

## 2019-12-05 MED ORDER — SODIUM CHLORIDE 0.9% FLUSH: 3.0000 mL | INTRAVENOUS | Status: DC | PRN

## 2019-12-05 MED ORDER — SODIUM CHLORIDE 0.9 % IV SOLN: 250.0000 mL | INTRAVENOUS | Status: DC | PRN

## 2019-12-05 MED ORDER — MIDAZOLAM HCL 5 MG/5ML IJ SOLN
INTRAMUSCULAR | Status: DC | PRN
  Administered 2019-12-05: 1 mg via INTRAVENOUS
  Administered 2019-12-05 (×2): 2 mg via INTRAVENOUS

## 2019-12-05 MED ORDER — BUPIVACAINE HCL (PF) 0.25 % IJ SOLN
INTRAMUSCULAR | Status: AC
  Filled 2019-12-05: qty 30

## 2019-12-05 MED ORDER — ONDANSETRON HCL 4 MG/2ML IJ SOLN: 4.0000 mg | Freq: Four times a day (QID) | INTRAMUSCULAR | Status: DC | PRN

## 2019-12-05 MED ORDER — LIDOCAINE HCL 1 % IJ SOLN
INTRAMUSCULAR | Status: AC
  Filled 2019-12-05: qty 60

## 2019-12-05 MED ORDER — ACETAMINOPHEN 325 MG PO TABS
650.0000 mg | ORAL_TABLET | ORAL | Status: DC | PRN
  Administered 2019-12-05: 650 mg via ORAL
  Filled 2019-12-05: qty 2

## 2019-12-05 MED ORDER — SODIUM CHLORIDE 0.9 % IV SOLN
INTRAVENOUS | Status: AC
  Filled 2019-12-05: qty 2

## 2019-12-05 MED ORDER — SODIUM CHLORIDE 0.9% FLUSH: 3.0000 mL | Freq: Two times a day (BID) | INTRAVENOUS | Status: DC

## 2019-12-05 MED ORDER — HEPARIN (PORCINE) IN NACL 2-0.9 UNITS/ML
INTRAMUSCULAR | Status: AC | PRN
  Administered 2019-12-05: 500 mL

## 2019-12-05 MED ORDER — BUPIVACAINE HCL (PF) 0.25 % IJ SOLN
INTRAMUSCULAR | Status: DC | PRN
  Administered 2019-12-05: 30 mL

## 2019-12-05 SURGICAL SUPPLY — 9 items
CABLE SURGICAL S-101-97-12 (CABLE) ×3 IMPLANT
CATH QUAD JOSEPHSON 5FR (CATHETERS) ×3 IMPLANT
HEMOSTAT SURGICEL 2X4 FIBR (HEMOSTASIS) ×3 IMPLANT
PACEMAKER ACCOLADE DR-EL (Pacemaker) ×3 IMPLANT
PACK EP LATEX FREE (CUSTOM PROCEDURE TRAY) ×3
PACK EP LF (CUSTOM PROCEDURE TRAY) ×1 IMPLANT
PAD PRO RADIOLUCENT 2001M-C (PAD) ×3 IMPLANT
SHEATH PINNACLE 6F 10CM (SHEATH) ×3 IMPLANT
TRAY PACEMAKER INSERTION (PACKS) ×3 IMPLANT

## 2019-12-05 NOTE — Discharge Instructions (Signed)
Post procedure care instructions No driving for 4 days. No lifting over 5 lbs for 1 week. No vigorous or sexual activity for 1 week. You may return to work/your usual activities on 12/12/2019. Keep procedure site clean & dry. If you notice increased pain, swelling, bleeding or pus, call/return!  You may shower, but no soaking baths/hot tubs/pools for 1 week.       Pacemaker Battery Change, Care After This sheet gives you information about how to care for yourself after your procedure. Your health care provider may also give you more specific instructions. If you have problems or questions, contact your health care provider. What can I expect after the procedure? After your procedure, it is common to have:  Pain or soreness at the site where the pacemaker was inserted.  Swelling at the site where the pacemaker was inserted. Follow these instructions at home: Incision care   Keep the incision clean and dry. ? Do not take baths, swim, or use a hot tub until your health care provider approves. ? You may shower the day after your procedure, or as directed by your health care provider. ? Pat the area dry with a clean towel. Do not rub the area. This may cause bleeding.  Follow instructions from your health care provider about how to take care of your incision. Make sure you: ? Wash your hands with soap and water before you change your bandage (dressing). If soap and water are not available, use hand sanitizer. ? Change your dressing as told by your health care provider. ? Leave stitches (sutures), skin glue, or adhesive strips in place. These skin closures may need to stay in place for 2 weeks or longer. If adhesive strip edges start to loosen and curl up, you may trim the loose edges. Do not remove adhesive strips completely unless your health care provider tells you to do that.  Check your incision area every day for signs of infection. Check for: ? More redness, swelling, or pain. ? More  fluid or blood. ? Warmth. ? Pus or a bad smell. Activity  Do not lift anything that is heavier than 10 lb (4.5 kg) until your health care provider says it is okay to do so.  For the first 2 weeks, or as long as told by your health care provider: ? Avoid lifting your left arm higher than your shoulder. ? Be gentle when you move your arms over your head. It is okay to raise your arm to comb your hair. ? Avoid strenuous exercise.  Ask your health care provider when it is okay to: ? Resume your normal activities. ? Return to work or school. ? Resume sexual activity. Eating and drinking  Eat a heart-healthy diet. This should include plenty of fresh fruits and vegetables, whole grains, low-fat dairy products, and lean protein like chicken and fish.  Limit alcohol intake to no more than 1 drink a day for non-pregnant women and 2 drinks a day for men. One drink equals 12 oz of beer, 5 oz of wine, or 1 oz of hard liquor.  Check ingredients and nutrition facts on packaged foods and beverages. Avoid the following types of food: ? Food that is high in salt (sodium). ? Food that is high in saturated fat, like full-fat dairy or red meat. ? Food that is high in trans fat, like fried food. ? Food and drinks that are high in sugar. Lifestyle  Do not use any products that contain nicotine or tobacco,  such as cigarettes and e-cigarettes. If you need help quitting, ask your health care provider.  Take steps to manage and control your weight.  Get regular exercise. Aim for 150 minutes of moderate-intensity exercise (such as walking or yoga) or 75 minutes of vigorous exercise (such as running or swimming) each week.  Manage other health problems, such as diabetes or high blood pressure. Ask your health care provider how you can manage these conditions. General instructions  Do not drive for 24 hours after your procedure if you were given a medicine to help you relax (sedative).  Take  over-the-counter and prescription medicines only as told by your health care provider.  Avoid putting pressure on the area where the pacemaker was placed.  If you need an MRI after your pacemaker has been placed, be sure to tell the health care provider who orders the MRI that you have a pacemaker.  Avoid close and prolonged exposure to electrical devices that have strong magnetic fields. These include: ? Cell phones. Avoid keeping them in a pocket near the pacemaker, and try using the ear opposite the pacemaker. ? MP3 players. ? Household appliances, like microwaves. ? Metal detectors. ? Electric generators. ? High-tension wires.  Keep all follow-up visits as directed by your health care provider. This is important. Contact a health care provider if:  You have pain at the incision site that is not relieved by over-the-counter or prescription medicines.  You have any of these around your incision site or coming from it: ? More redness, swelling, or pain. ? Fluid or blood. ? Warmth to the touch. ? Pus or a bad smell.  You have a fever.  You feel brief, occasional palpitations, light-headedness, or any symptoms that you think might be related to your heart. Get help right away if:  You experience chest pain that is different from the pain at the pacemaker site.  You develop a red streak that extends above or below the incision site.  You experience shortness of breath.  You have palpitations or an irregular heartbeat.  You have light-headedness that does not go away quickly.  You faint or have dizzy spells.  Your pulse suddenly drops or increases rapidly and does not return to normal.  You begin to gain weight and your legs and ankles swell. Summary  After your procedure, it is common to have pain, soreness, and some swelling where the pacemaker was inserted.  Make sure to keep your incision clean and dry. Follow instructions from your health care provider about how to  take care of your incision.  Check your incision every day for signs of infection, such as more pain or swelling, pus or a bad smell, warmth, or leaking fluid and blood.  Avoid strenuous exercise and lifting your left arm higher than your shoulder for 2 weeks, or as long as told by your health care provider. This information is not intended to replace advice given to you by your health care provider. Make sure you discuss any questions you have with your health care provider. Document Revised: 01/23/2017 Document Reviewed: 01/03/2016 Elsevier Patient Education  2020 [employer].

## 2019-12-05 NOTE — Progress Notes (Signed)
Discharge instructions reviewed with pt and her husband (via telephone) both voice understanding.  

## 2019-12-05 NOTE — Progress Notes (Signed)
Site area: Right groin a 6 french venous sheath was removed  Site Prior to Removal:  Level 0  Pressure Applied For 15 MINUTES    Bedrest Beginning at 1020am  Manual:   Yes.    Patient Status During Pull:  stable  Post Pull Groin Site:  Level 0  Post Pull Instructions Given:  Yes.    Post Pull Pulses Present:  Yes.    Dressing Applied:  Yes.    Comments:

## 2019-12-05 NOTE — Progress Notes (Signed)
Renee, Pa called and informed of Bp new orders noted.

## 2019-12-06 ENCOUNTER — Encounter (HOSPITAL_COMMUNITY): Payer: Self-pay | Admitting: Internal Medicine

## 2019-12-06 MED FILL — Lidocaine HCl Local Inj 1%: INTRAMUSCULAR | Qty: 60 | Status: AC

## 2019-12-06 MED FILL — Cefazolin Sodium-Dextrose IV Solution 2 GM/100ML-4%: INTRAVENOUS | Qty: 100 | Status: AC

## 2019-12-06 MED FILL — Gentamicin Sulfate Inj 40 MG/ML: INTRAMUSCULAR | Qty: 80 | Status: AC

## 2019-12-15 ENCOUNTER — Ambulatory Visit (INDEPENDENT_AMBULATORY_CARE_PROVIDER_SITE_OTHER): Payer: Medicare Other | Admitting: Emergency Medicine

## 2019-12-15 ENCOUNTER — Other Ambulatory Visit: Payer: Self-pay

## 2019-12-15 DIAGNOSIS — I442 Atrioventricular block, complete: Secondary | ICD-10-CM

## 2019-12-15 LAB — CUP PACEART INCLINIC DEVICE CHECK
Brady Statistic RA Percent Paced: 5 %
Brady Statistic RV Percent Paced: 100 %
Date Time Interrogation Session: 20211021102838
Implantable Lead Implant Date: 20130830
Implantable Lead Implant Date: 20130830
Implantable Lead Location: 753859
Implantable Lead Location: 753860
Implantable Lead Model: 4456
Implantable Lead Model: 4479
Implantable Lead Serial Number: 473325
Implantable Lead Serial Number: 523784
Implantable Pulse Generator Implant Date: 20211011
Lead Channel Impedance Value: 446 Ohm
Lead Channel Impedance Value: 646 Ohm
Lead Channel Pacing Threshold Amplitude: 0.6 V
Lead Channel Pacing Threshold Amplitude: 0.6 V
Lead Channel Pacing Threshold Pulse Width: 0.4 ms
Lead Channel Pacing Threshold Pulse Width: 0.4 ms
Lead Channel Sensing Intrinsic Amplitude: 3.7 mV
Lead Channel Setting Pacing Amplitude: 1.1 V
Lead Channel Setting Pacing Amplitude: 2 V
Lead Channel Setting Pacing Pulse Width: 0.4 ms
Lead Channel Setting Sensing Sensitivity: 2.5 mV
Pulse Gen Serial Number: 949303

## 2019-12-15 MED ORDER — ATORVASTATIN CALCIUM 40 MG PO TABS
40.0000 mg | ORAL_TABLET | Freq: Every day | ORAL | 1 refills | Status: DC
Start: 2019-12-15 — End: 2020-05-14

## 2019-12-15 NOTE — Progress Notes (Signed)
Wound check appointment.  Dermabond removed. Wound without redness or edema. Incision edges approximated, wound well healed. Normal device function. Thresholds, RA sensing, and impedances consistent with implant measurements. Device programmed at  appropriate output for chronic leads.  Histogram distribution appropriate for patient and level of activity. No mode switches or high ventricular rates noted. Patient educated about wound care, arm mobility, lifting restrictions. Patient is enrolled in remote monitoring, next scheduled check 03/08/20.  ROV on 03/13/20 with Dr. Caryn Section.

## 2019-12-20 NOTE — Interval H&P Note (Signed)
History and Physical Interval Note:  12/20/2019 8:32 PM  Diana Cook  has presented today for surgery, with the diagnosis of ERI.  The various methods of treatment have been discussed with the patient and family. After consideration of risks, benefits and other options for treatment, the patient has consented to  Procedure(s): PPM GENERATOR CHANGEOUT (N/A) TEMPORARY PACEMAKER (N/A) as a surgical intervention.  The patient's history has been reviewed, patient examined, no change in status, stable for surgery.  I have reviewed the patient's chart and labs.  Questions were answered to the patient's satisfaction.     Virl Axe

## 2019-12-27 ENCOUNTER — Ambulatory Visit
Admission: RE | Admit: 2019-12-27 | Discharge: 2019-12-27 | Disposition: A | Discharge status: Home / Self Care | Payer: Medicare Other | Source: Ambulatory Visit | Attending: General Surgery | Admitting: General Surgery

## 2019-12-27 ENCOUNTER — Other Ambulatory Visit: Payer: Self-pay

## 2019-12-27 DIAGNOSIS — C50411 Malignant neoplasm of upper-outer quadrant of right female breast: Secondary | ICD-10-CM

## 2019-12-27 DIAGNOSIS — Z853 Personal history of malignant neoplasm of breast: Secondary | ICD-10-CM | POA: Diagnosis not present

## 2019-12-27 DIAGNOSIS — R922 Inconclusive mammogram: Secondary | ICD-10-CM | POA: Diagnosis not present

## 2019-12-27 DIAGNOSIS — Z170 Estrogen receptor positive status [ER+]: Secondary | ICD-10-CM | POA: Diagnosis not present

## 2020-01-03 DIAGNOSIS — Z853 Personal history of malignant neoplasm of breast: Secondary | ICD-10-CM | POA: Diagnosis not present

## 2020-01-10 ENCOUNTER — Telehealth: Payer: Self-pay | Admitting: *Deleted

## 2020-01-10 NOTE — Telephone Encounter (Signed)
Attempted to contact and schedule lung screening scan. Message left for patient to call back to schedule. 

## 2020-01-24 ENCOUNTER — Other Ambulatory Visit: Payer: Self-pay | Admitting: *Deleted

## 2020-01-24 DIAGNOSIS — Z122 Encounter for screening for malignant neoplasm of respiratory organs: Secondary | ICD-10-CM

## 2020-01-24 DIAGNOSIS — Z87891 Personal history of nicotine dependence: Secondary | ICD-10-CM

## 2020-01-24 NOTE — Progress Notes (Signed)
Contacted and scheduled for lung screening ct scan. Patient is a former smoker, quit 11/11/10, 42 pack year history.

## 2020-01-27 ENCOUNTER — Other Ambulatory Visit: Payer: Self-pay

## 2020-01-27 ENCOUNTER — Ambulatory Visit
Admission: RE | Admit: 2020-01-27 | Discharge: 2020-01-27 | Disposition: A | Discharge status: Home / Self Care | Payer: Medicare Other | Source: Ambulatory Visit | Attending: Oncology | Admitting: Oncology

## 2020-01-27 DIAGNOSIS — Z87891 Personal history of nicotine dependence: Secondary | ICD-10-CM | POA: Insufficient documentation

## 2020-01-27 DIAGNOSIS — Z122 Encounter for screening for malignant neoplasm of respiratory organs: Secondary | ICD-10-CM | POA: Diagnosis not present

## 2020-01-30 ENCOUNTER — Other Ambulatory Visit: Payer: Self-pay | Admitting: Family Medicine

## 2020-01-30 ENCOUNTER — Telehealth: Payer: Self-pay | Admitting: *Deleted

## 2020-01-30 NOTE — Telephone Encounter (Signed)
Radiology called left message to return call for a called report, but there is no report available. I called and spoke with Diana Cook and she cannot see report either and is going to call the help desk

## 2020-01-31 ENCOUNTER — Telehealth: Payer: Self-pay | Admitting: *Deleted

## 2020-01-31 NOTE — Telephone Encounter (Signed)
Study Result  Narrative & Impression  CLINICAL DATA:  69 year old female former smoker (quit 9 years ago) with 42 pack-year history of smoking. Lung cancer screening examination.  EXAM: CT CHEST WITHOUT CONTRAST LOW-DOSE FOR LUNG CANCER SCREENING  TECHNIQUE: Multidetector CT imaging of the chest was performed following the standard protocol without IV contrast.  COMPARISON:  Low-dose lung cancer screening chest CT 01/19/2019.  FINDINGS: Cardiovascular: Heart size is normal. There is no significant pericardial fluid, thickening or pericardial calcification. There is aortic atherosclerosis, as well as atherosclerosis of the great vessels of the mediastinum and the coronary arteries, including calcified atherosclerotic plaque in the left main, left anterior descending and right coronary arteries. Left-sided pacemaker device in place with lead tips terminating in the right atrium and right ventricle.  Mediastinum/Nodes: No pathologically enlarged mediastinal or hilar lymph nodes. Please note that accurate exclusion of hilar adenopathy is limited on noncontrast CT scans. Esophagus is unremarkable in appearance. No axillary lymphadenopathy.  Lungs/Pleura: Multiple pulmonary nodules are noted throughout the lungs bilaterally, largest and most concerning of which has grown compared to the prior examination in the periphery of the left lower lobe (axial image 206 of series 3), with a volume derived mean diameter of 7.6 mm. No acute consolidative airspace disease. No pleural effusions. Diffuse bronchial wall thickening with mild centrilobular and paraseptal emphysema.  Upper Abdomen: Aortic atherosclerosis. 6 mm fatty attenuation lesion in the anterior aspect of the interpolar region of the left kidney (axial image 73 of series 2), compatible with a small angiomyolipoma.  Musculoskeletal: There are no aggressive appearing lytic or blastic lesions noted in the visualized  portions of the skeleton.  IMPRESSION: 1. Lung-RADS 4AS, suspicious. Follow up low-dose chest CT without contrast in 3 months (please use the following order, "CT CHEST LCS NODULE FOLLOW-UP W/O CM") is recommended. Alternatively, PET may be considered when there is a solid component 24mm or larger. 2. The "S" modifier above refers to potentially clinically significant non lung cancer related findings. Specifically, there is aortic atherosclerosis, in addition to left main and 2 vessel coronary artery disease. Assessment for potential risk factor modification, dietary therapy or pharmacologic therapy may be warranted, if clinically indicated. 3. Mild diffuse bronchial wall thickening with mild centrilobular and paraseptal emphysema; imaging findings suggestive of underlying COPD. 4. 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  These results will be called to the ordering clinician or representative by the Radiologist Assistant, and communication documented in the PACS or [employer].  Aortic Atherosclerosis (ICD10-I70.0) and Emphysema (ICD10-J43.9).   Electronically Signed   By: Vinnie Langton M.D.   On: 01/30/2020 16:57

## 2020-01-31 NOTE — Telephone Encounter (Signed)
Notified patient of LDCT lung cancer screening program results with recommendation for 3 month follow up imaging. Also notified of incidental findings noted below and is encouraged to discuss further with PCP who will receive a copy of this note and/or the CT report. Patient verbalizes understanding. Will contact patient close to the date follow up is due for scheduling per patient request.   IMPRESSION: 1. Lung-RADS 4AS, suspicious. Follow up low-dose chest CT without contrast in 3 months (please use the following order, "CT CHEST LCS NODULE FOLLOW-UP W/O CM") is recommended. Alternatively, PET may be considered when there is a solid component 66mm or larger. 2. The "S" modifier above refers to potentially clinically significant non lung cancer related findings. Specifically, there is aortic atherosclerosis, in addition to left main and 2 vessel coronary artery disease. Assessment for potential risk factor modification, dietary therapy or pharmacologic therapy may be warranted, if clinically indicated. 3. Mild diffuse bronchial wall thickening with mild centrilobular and paraseptal emphysema; imaging findings suggestive of underlying COPD. 4. 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

## 2020-01-31 NOTE — Telephone Encounter (Signed)
Please schedule winter PE or f/u and refill until then

## 2020-01-31 NOTE — Telephone Encounter (Signed)
Pt hasn't been seen in over a year and no future appts., please advise  

## 2020-01-31 NOTE — Telephone Encounter (Signed)
Already followed for CAD and on statins

## 2020-01-31 NOTE — Telephone Encounter (Signed)
There is a small mass incidentally seen in the kidney that is benign -we do not generally do anything about it unless it causes pain or blood in urine  Please ask her if she has had any of those things

## 2020-01-31 NOTE — Telephone Encounter (Signed)
Med refilled once and Carrie will reach out to pt to try and get appt scheduled  

## 2020-02-01 NOTE — Telephone Encounter (Signed)
Agree that will need repeat in 3 months.  Nodule is too small to PET at this point.  But it does require close follow-up in 3 months.

## 2020-02-06 ENCOUNTER — Telehealth: Payer: Self-pay | Admitting: *Deleted

## 2020-02-06 NOTE — Telephone Encounter (Signed)
Try to increase fluids for better health We can discuss further at her visit

## 2020-02-06 NOTE — Telephone Encounter (Signed)
Left VM requesting pt to call the office back 

## 2020-02-06 NOTE — Telephone Encounter (Signed)
Pt returned my call pt said she has had back pain for years so she does have pain but it's nothing new and it's not worsening its the same. Pt said she knows she doesn't drink enough water so she doesn't urinate much but she doesn't have any other issues, no dysuria, no frequency, no hematuria or anything. Pt does have a CPE on 03/13/20 and want to know if she needs to do anything right now, or just discuss it with PCP at CPE appt

## 2020-02-06 NOTE — Telephone Encounter (Signed)
Pt notified of Dr. Tower's comments and verbalized understanding  

## 2020-02-06 NOTE — Telephone Encounter (Signed)
Dr. Glori Bickers put a note on her lung cancer screening CT saying:  There is a small mass incidentally seen in the kidney that is benign -we do not generally do anything about it unless it causes pain or blood in urine  Please ask her if she has had any of those things

## 2020-02-15 ENCOUNTER — Ambulatory Visit (INDEPENDENT_AMBULATORY_CARE_PROVIDER_SITE_OTHER)
Admission: RE | Admit: 2020-02-15 | Discharge: 2020-02-15 | Disposition: A | Discharge status: Home / Self Care | Payer: Medicare Other | Source: Ambulatory Visit | Attending: Family Medicine | Admitting: Family Medicine

## 2020-02-15 ENCOUNTER — Ambulatory Visit (INDEPENDENT_AMBULATORY_CARE_PROVIDER_SITE_OTHER): Payer: Medicare Other | Admitting: Family Medicine

## 2020-02-15 ENCOUNTER — Ambulatory Visit
Admission: RE | Admit: 2020-02-15 | Discharge: 2020-02-15 | Disposition: A | Discharge status: Home / Self Care | Payer: Medicare Other | Source: Ambulatory Visit | Attending: Family Medicine | Admitting: Family Medicine

## 2020-02-15 ENCOUNTER — Other Ambulatory Visit: Payer: Self-pay

## 2020-02-15 ENCOUNTER — Encounter: Payer: Self-pay | Admitting: Family Medicine

## 2020-02-15 VITALS — BP 124/72 | HR 70 | Temp 97.7°F | Ht 68.0 in | Wt 183.4 lb

## 2020-02-15 DIAGNOSIS — R202 Paresthesia of skin: Secondary | ICD-10-CM

## 2020-02-15 DIAGNOSIS — M542 Cervicalgia: Secondary | ICD-10-CM

## 2020-02-15 DIAGNOSIS — M4802 Spinal stenosis, cervical region: Secondary | ICD-10-CM | POA: Diagnosis not present

## 2020-02-15 DIAGNOSIS — H9192 Unspecified hearing loss, left ear: Secondary | ICD-10-CM

## 2020-02-15 DIAGNOSIS — M4312 Spondylolisthesis, cervical region: Secondary | ICD-10-CM | POA: Diagnosis not present

## 2020-02-15 DIAGNOSIS — Z950 Presence of cardiac pacemaker: Secondary | ICD-10-CM | POA: Diagnosis not present

## 2020-02-15 DIAGNOSIS — I2510 Atherosclerotic heart disease of native coronary artery without angina pectoris: Secondary | ICD-10-CM

## 2020-02-15 DIAGNOSIS — R200 Anesthesia of skin: Secondary | ICD-10-CM | POA: Diagnosis not present

## 2020-02-15 DIAGNOSIS — I739 Peripheral vascular disease, unspecified: Secondary | ICD-10-CM | POA: Diagnosis not present

## 2020-02-15 DIAGNOSIS — I708 Atherosclerosis of other arteries: Secondary | ICD-10-CM | POA: Diagnosis not present

## 2020-02-15 DIAGNOSIS — J3489 Other specified disorders of nose and nasal sinuses: Secondary | ICD-10-CM | POA: Diagnosis not present

## 2020-02-15 DIAGNOSIS — M47812 Spondylosis without myelopathy or radiculopathy, cervical region: Secondary | ICD-10-CM | POA: Diagnosis not present

## 2020-02-15 MED ORDER — GABAPENTIN 100 MG PO CAPS: ORAL_CAPSULE | ORAL | 1 refills | Status: DC

## 2020-02-15 NOTE — Progress Notes (Signed)
Patient ID: Diana Cook, female    DOB: [birth year], 68 y.o.   MRN: [id number]  This visit was conducted in person.  BP 124/72 (BP Location: Right Arm, Patient Position: Sitting, Cuff Size: Normal)   Pulse 70   Temp 97.7 F (36.5 C) (Temporal)   Ht 5\' 8"  (1.727 m)   Wt 183 lb 7 oz (83.2 kg)   SpO2 93%   BMI 27.89 kg/m    Hearing Screening   125Hz  250Hz  500Hz  1000Hz  2000Hz  3000Hz  4000Hz  6000Hz  8000Hz   Right ear:   20 25 20  20     Left ear:   25 40 20  40      CC: pain, numbness to neck Subjective:   HPI: Diana Cook is a 69 y.o. female presenting on 02/15/2020 for Numbness (C/o numbness and pain in left side of neck.  Started 02/11/20.  Tried Tylenol, not helpful. )   Mother of Diana Cook.   4d h/o L neck pain radiating to ear associated with constant tingling and numbness. Also notes some intermittent shooting pain with paresthesias down at R dorsal hand worse at night. R forearm and upper arm are spared. No alleviating factors. Worse when turning head to the left.   No shooting pain or numbness down L shoulder and arm.  No fevers/chills, headache, dizziness, vision changes, other body parts numb or weak, slurred speech, confusion. No leg or foot pain with flexing neck forward. No l'hermitte sign.  Denies inciting trauma/injury or fall.  Tried tylenol and topical voltaren without benefit.  No h/o bell's palsy   Pacemaker recently replaced - this limits MRI.  Known epilepsy on keppra 250mg  bid.  Started aspirin 81mg  and lipitor 40mg  daily last year.   Upcoming PCP appt next month.      Relevant past medical, surgical, family and social history reviewed and updated as indicated. Interim medical history since our last visit reviewed. Allergies and medications reviewed and updated. Outpatient Medications Prior to Visit  Medication Sig Dispense Refill  . acetaminophen (TYLENOL) 500 MG tablet Take 500-1,000 mg by mouth every 6 (six) hours as needed for mild pain or  headache.     . albuterol (VENTOLIN HFA) 108 (90 Base) MCG/ACT inhaler Inhale 2 puffs into the lungs every 4 (four) hours as needed for wheezing or shortness of breath. 18 g 1  . aspirin EC 81 MG tablet Take 81 mg by mouth daily. Swallow whole.    Marland Kitchen atorvastatin (LIPITOR) 40 MG tablet Take 1 tablet (40 mg total) by mouth daily. 90 tablet 1  . Calcium Carb-Cholecalciferol (CALCIUM 600 + D PO) Take 1 tablet by mouth 2 (two) times daily.    Marland Kitchen FLUoxetine (PROZAC) 20 MG capsule Take 1 capsule by mouth once daily 90 capsule 0  . furosemide (LASIX) 40 MG tablet Take 1 tablet (40 mg total) by mouth daily as needed for edema (For weight gain or swelling.). 90 tablet 3  . levETIRAcetam (KEPPRA) 250 MG tablet Take 250 mg by mouth 2 (two) times daily.     . OXYGEN Inhale 3 L into the lungs at bedtime.     Marland Kitchen STIOLTO RESPIMAT 2.5-2.5 MCG/ACT AERS INHALE 2 PUFFS BY MOUTH ONCE DAILY (Patient taking differently: Inhale 2 puffs into the lungs daily.) 4 g 5  . zolpidem (AMBIEN) 10 MG tablet TAKE 1/2 TO 1 (ONE-HALF TO ONE) TABLET BY MOUTH AT BEDTIME AS NEEDED FOR SLEEP (Patient taking differently: Take 10 mg by mouth at bedtime.)  30 tablet 5   No facility-administered medications prior to visit.     Per HPI unless specifically indicated in ROS section below Review of Systems Objective:  BP 124/72 (BP Location: Right Arm, Patient Position: Sitting, Cuff Size: Normal)   Pulse 70   Temp 97.7 F (36.5 C) (Temporal)   Ht 5\' 8"  (1.727 m)   Wt 183 lb 7 oz (83.2 kg)   SpO2 93%   BMI 27.89 kg/m   Wt Readings from Last 3 Encounters:  02/15/20 183 lb 7 oz (83.2 kg)  01/27/20 177 lb (80.3 kg)  12/05/19 179 lb (81.2 kg)      Physical Exam Vitals and nursing note reviewed.  Constitutional:      Appearance: Normal appearance. She is not ill-appearing.  HENT:     Head: Normocephalic and atraumatic.     Right Ear: Tympanic membrane, ear canal and external ear normal. There is no impacted cerumen.     Left Ear:  Tympanic membrane, ear canal and external ear normal. There is no impacted cerumen.     Mouth/Throat:     Mouth: Mucous membranes are moist.     Pharynx: Oropharynx is clear. No oropharyngeal exudate or posterior oropharyngeal erythema.  Eyes:     Extraocular Movements: Extraocular movements intact.     Conjunctiva/sclera: Conjunctivae normal.     Pupils: Pupils are equal, round, and reactive to light.  Neck:     Comments:  Discomfort midline mid cervical spine palpation  Limited ROM to left neck on lateral flexion and rotation due to pain Pain to palpation along L paracervical mm into L trapezius  Musculoskeletal:        General: Normal range of motion.     Cervical back: Normal range of motion and neck supple. No rigidity.     Comments:  No pain at bilateral shoulders FROM bilateral shoulders  Lymphadenopathy:     Cervical: No cervical adenopathy.  Skin:    General: Skin is warm and dry.     Findings: No rash.  Neurological:     Mental Status: She is alert.     Cranial Nerves: Cranial nerve deficit present.     Sensory: Sensation is intact.     Coordination: Coordination is intact. Romberg sign negative. Finger-Nose-Finger Test normal.     Comments:  Decreased hearing noted on left, slight L facial droop at left lips otherwise CN 2-12 intact 5/5 strength BUE, BLE, grip strength intact Sensation intact to light touch  EOMI No pronator drift Neg spurling bilaterally however reproducible neck pain present with head positioning for spurling on left  Psychiatric:        Mood and Affect: Mood normal.        Behavior: Behavior normal.       Assessment & Plan:  This visit occurred during the SARS-CoV-2 public health emergency.  Safety protocols were in place, including screening questions prior to the visit, additional usage of staff PPE, and extensive cleaning of exam room while observing appropriate contact time as indicated for disinfecting solutions.   Problem List Items  Addressed This Visit    Paresthesia   Relevant Orders   DG Cervical Spine Complete   CT Head Wo Contrast   Pacemaker-[employer]   Neck pain on left side - Primary    New over 4 days associated with neurologic deficits (paresthesias/numbness to entire left side of neck as well as decreased hearing to left ear). In interim, Rx gabapentin taper as per instructions,  reviewed sedation precautions as well as other common side effects to watch for.  Check head CT and neck films.  MRI eval limited by pacemaker status.       Relevant Orders   DG Cervical Spine Complete    Other Visit Diagnoses    Hearing loss of left ear, unspecified hearing loss type       Relevant Orders   CT Head Wo Contrast       Meds ordered this encounter  Medications  . gabapentin (NEURONTIN) 100 MG capsule    Sig: Take 1 capsule (100 mg total) by mouth 2 (two) times daily for 3 days, THEN 2 capsules (200 mg total) 2 (two) times daily for 3 days, THEN 3 capsules (300 mg total) 2 (two) times daily.    Dispense:  90 capsule    Refill:  1   Orders Placed This Encounter  Procedures  . DG Cervical Spine Complete    Standing Status:   Future    Number of Occurrences:   1    Standing Expiration Date:   02/14/2021    Order Specific Question:   Reason for Exam (SYMPTOM  OR DIAGNOSIS REQUIRED)    Answer:   left neck pain, numbness    Order Specific Question:   Preferred imaging location?    Answer:   [facility]  . CT Head Wo Contrast    Standing Status:   Future    Standing Expiration Date:   02/14/2021    Order Specific Question:   Preferred imaging location?    Answer:   [facility]    Patient Instructions  Hearing screen today  I want to check neck xray to evaluate for possible pinched nerve in the neck, and head CT to help rule out stroke.  Use heating pad to the neck, gentle stretching to the neck. Take gabapentin 100mg  twice daily with option to increase to 200mg  twice daily after 3  days then 300mg  twice daily after another 3 days. Caution with sedation on this medicine.  See our referral coordinator to schedule head CT.   Follow up plan: Return if symptoms worsen or fail to improve.  Ria Bush, MD

## 2020-02-15 NOTE — Patient Instructions (Addendum)
Hearing screen today  I want to check neck xray to evaluate for possible pinched nerve in the neck, and head CT to help rule out stroke.  Use heating pad to the neck, gentle stretching to the neck. Take gabapentin 100mg  twice daily with option to increase to 200mg  twice daily after 3 days then 300mg  twice daily after another 3 days. Caution with sedation on this medicine.  See our referral coordinator to schedule head CT.

## 2020-02-15 NOTE — Assessment & Plan Note (Signed)
New over 4 days associated with neurologic deficits (paresthesias/numbness to entire left side of neck as well as decreased hearing to left ear). In interim, Rx gabapentin taper as per instructions, reviewed sedation precautions as well as other common side effects to watch for.  Check head CT and neck films.  MRI eval limited by pacemaker status.

## 2020-02-27 ENCOUNTER — Telehealth: Payer: Self-pay | Admitting: Family Medicine

## 2020-02-27 DIAGNOSIS — M792 Neuralgia and neuritis, unspecified: Secondary | ICD-10-CM

## 2020-02-27 DIAGNOSIS — M542 Cervicalgia: Secondary | ICD-10-CM

## 2020-02-27 NOTE — Telephone Encounter (Signed)
plz touch base with patient

## 2020-02-27 NOTE — Telephone Encounter (Signed)
Patient is calling to give an update from the time you saw her a few weeks ago from her pinch nerve in her neck. Patient is asking for a call back to discuss EM [phone]

## 2020-02-28 NOTE — Telephone Encounter (Addendum)
Spoke with patient.  She is currently suffering from cough/cold. She is afebrile. Husband getting over the flu. He tested negative for COVID. Overall improving.   Gabapentin 200mg  TID has markedly improved neck pain. Persistent tingling/paresthesias to left neck and palm of right hand, not R arm.   Discussed possible cervical imaging - she thinks type of pacemaker is compatible with MRI.  Consider checking vitamin b12 at upcoming lab.   Dr Glori Bickers can we touch base tomorrow about possible next steps? Thanks.

## 2020-02-28 NOTE — Telephone Encounter (Signed)
Called pt. She said the pain is much better with the gabapentin 200mg  tid. But there is still numbness and tingling in the right hand. Not as much but still happening.

## 2020-02-28 NOTE — Telephone Encounter (Signed)
I would like to set up MRI of CS Does she prefer [place] or [place] ? Let her know we will most likely need approval from insurance and that can take a little while

## 2020-02-29 NOTE — Telephone Encounter (Addendum)
Pt agrees with MRI of CS, pt would like to get it done in [place] if possible, I advise pt once we get it approved by insurance that our [facility] will call to schedule appt.   **pt does have a pacemaker but thinks it's the kind she can still get an MRI done with, but wants to make sure. Pt wanted [place] to "run it by Dr. Caryl Comes" just to make sure she is able to have the MRI. Phone note routed to Dr. Caryl Comes for review and also back to Dr. Glori Bickers**

## 2020-02-29 NOTE — Telephone Encounter (Signed)
Routing this to Dr Caryl Comes- question- can pt have MRI with the type of pacer she has? Please let me know -thanks!

## 2020-03-01 NOTE — Telephone Encounter (Signed)
[place].  Happy new year.  Yes she can have an MRI  [place]

## 2020-03-02 DIAGNOSIS — M792 Neuralgia and neuritis, unspecified: Secondary | ICD-10-CM | POA: Insufficient documentation

## 2020-03-02 NOTE — Telephone Encounter (Signed)
Please let pt know I heard back from Dr Caryl Comes who said she could have the MRI with her current pacemaker   I put the order in and she will get a call

## 2020-03-02 NOTE — Addendum Note (Signed)
Addended by: Loura Pardon A on: 03/02/2020 03:48 PM   Modules accepted: Orders

## 2020-03-05 DIAGNOSIS — M48062 Spinal stenosis, lumbar region with neurogenic claudication: Secondary | ICD-10-CM | POA: Diagnosis not present

## 2020-03-05 DIAGNOSIS — M5136 Other intervertebral disc degeneration, lumbar region: Secondary | ICD-10-CM | POA: Diagnosis not present

## 2020-03-05 DIAGNOSIS — M5416 Radiculopathy, lumbar region: Secondary | ICD-10-CM | POA: Diagnosis not present

## 2020-03-06 NOTE — Telephone Encounter (Signed)
Dr. Gonzalez, please advise. thanks 

## 2020-03-06 NOTE — Telephone Encounter (Signed)
The ipratropium or Atrovent is only a single medication.  Stiolto is a dual medication.  Additionally, Atrovent would have to be dosed 4 times a day closer to Stiolto his Combivent however again, that would have to be dosed 4 times a day.  We could check to see if Combivent is covered but she would have to use it 4 times a day to have the same effect as still the once a day.  Alternativel he could apply for medication assistance by the company that makes [place].

## 2020-03-07 NOTE — Telephone Encounter (Signed)
Atrovent 2 puffs 4 times a day, HFA.

## 2020-03-07 NOTE — Telephone Encounter (Signed)
Dr. Patsey Berthold, please advise. Patient would like to proceed with Atrovent.  Do you prefer HFA or neb?

## 2020-03-08 ENCOUNTER — Telehealth: Payer: Self-pay | Admitting: Family Medicine

## 2020-03-08 ENCOUNTER — Ambulatory Visit (INDEPENDENT_AMBULATORY_CARE_PROVIDER_SITE_OTHER): Payer: Medicare Other

## 2020-03-08 DIAGNOSIS — K219 Gastro-esophageal reflux disease without esophagitis: Secondary | ICD-10-CM

## 2020-03-08 DIAGNOSIS — I442 Atrioventricular block, complete: Secondary | ICD-10-CM

## 2020-03-08 DIAGNOSIS — T148XXA Other injury of unspecified body region, initial encounter: Secondary | ICD-10-CM

## 2020-03-08 DIAGNOSIS — E785 Hyperlipidemia, unspecified: Secondary | ICD-10-CM

## 2020-03-08 DIAGNOSIS — M810 Age-related osteoporosis without current pathological fracture: Secondary | ICD-10-CM

## 2020-03-08 DIAGNOSIS — Z0000 Encounter for general adult medical examination without abnormal findings: Secondary | ICD-10-CM

## 2020-03-08 DIAGNOSIS — R7309 Other abnormal glucose: Secondary | ICD-10-CM

## 2020-03-08 NOTE — Telephone Encounter (Signed)
-----   Message from Cloyd Stagers, RT sent at 02/20/2020  4:12 PM EST ----- Regarding: Lab Orders for Friday 1.14.2022 Please place lab orders for Friday 1.14.2022, office visit for physical on Tuesday 1.18.2022 Thank you, Dyke Maes RT(R)

## 2020-03-09 ENCOUNTER — Other Ambulatory Visit: Payer: Self-pay

## 2020-03-09 ENCOUNTER — Other Ambulatory Visit (INDEPENDENT_AMBULATORY_CARE_PROVIDER_SITE_OTHER): Payer: Medicare Other

## 2020-03-09 ENCOUNTER — Telehealth: Payer: Self-pay

## 2020-03-09 ENCOUNTER — Ambulatory Visit (INDEPENDENT_AMBULATORY_CARE_PROVIDER_SITE_OTHER): Payer: Medicare Other

## 2020-03-09 DIAGNOSIS — T148XXA Other injury of unspecified body region, initial encounter: Secondary | ICD-10-CM

## 2020-03-09 DIAGNOSIS — K219 Gastro-esophageal reflux disease without esophagitis: Secondary | ICD-10-CM | POA: Diagnosis not present

## 2020-03-09 DIAGNOSIS — E785 Hyperlipidemia, unspecified: Secondary | ICD-10-CM

## 2020-03-09 DIAGNOSIS — M810 Age-related osteoporosis without current pathological fracture: Secondary | ICD-10-CM | POA: Diagnosis not present

## 2020-03-09 DIAGNOSIS — R7309 Other abnormal glucose: Secondary | ICD-10-CM | POA: Diagnosis not present

## 2020-03-09 DIAGNOSIS — Z0000 Encounter for general adult medical examination without abnormal findings: Secondary | ICD-10-CM

## 2020-03-09 LAB — CUP PACEART REMOTE DEVICE CHECK
Battery Remaining Longevity: 162 mo
Battery Remaining Percentage: 100 %
Brady Statistic RA Percent Paced: 4 %
Brady Statistic RV Percent Paced: 100 %
Date Time Interrogation Session: 20220113071000
Implantable Lead Implant Date: 20130830
Implantable Lead Implant Date: 20130830
Implantable Lead Location: 753859
Implantable Lead Location: 753860
Implantable Lead Model: 4456
Implantable Lead Model: 4479
Implantable Lead Serial Number: 473325
Implantable Lead Serial Number: 523784
Implantable Pulse Generator Implant Date: 20211011
Lead Channel Impedance Value: 413 Ohm
Lead Channel Impedance Value: 617 Ohm
Lead Channel Pacing Threshold Amplitude: 0.4 V
Lead Channel Pacing Threshold Amplitude: 0.7 V
Lead Channel Pacing Threshold Pulse Width: 0.4 ms
Lead Channel Pacing Threshold Pulse Width: 0.4 ms
Lead Channel Setting Pacing Amplitude: 1.2 V
Lead Channel Setting Pacing Amplitude: 2 V
Lead Channel Setting Pacing Pulse Width: 0.4 ms
Lead Channel Setting Sensing Sensitivity: 2.5 mV
Pulse Gen Serial Number: 949303

## 2020-03-09 LAB — CBC WITH DIFFERENTIAL/PLATELET
Basophils Absolute: 0.1 10*3/uL (ref 0.0–0.1)
Basophils Relative: 1 % (ref 0.0–3.0)
Eosinophils Absolute: 0.3 10*3/uL (ref 0.0–0.7)
Eosinophils Relative: 3.2 % (ref 0.0–5.0)
HCT: 40.4 % (ref 36.0–46.0)
Hemoglobin: 13.8 g/dL (ref 12.0–15.0)
Lymphocytes Relative: 16.7 % (ref 12.0–46.0)
Lymphs Abs: 1.5 10*3/uL (ref 0.7–4.0)
MCHC: 34.1 g/dL (ref 30.0–36.0)
MCV: 88.6 fl (ref 78.0–100.0)
Monocytes Absolute: 0.9 10*3/uL (ref 0.1–1.0)
Monocytes Relative: 9.5 % (ref 3.0–12.0)
Neutro Abs: 6.3 10*3/uL (ref 1.4–7.7)
Neutrophils Relative %: 69.6 % (ref 43.0–77.0)
Platelets: 318 10*3/uL (ref 150.0–400.0)
RBC: 4.56 Mil/uL (ref 3.87–5.11)
RDW: 13.2 % (ref 11.5–15.5)
WBC: 9.1 10*3/uL (ref 4.0–10.5)

## 2020-03-09 LAB — COMPREHENSIVE METABOLIC PANEL
ALT: 18 U/L (ref 0–35)
AST: 18 U/L (ref 0–37)
Albumin: 4.6 g/dL (ref 3.5–5.2)
Alkaline Phosphatase: 87 U/L (ref 39–117)
BUN: 32 mg/dL — ABNORMAL HIGH (ref 6–23)
CO2: 28 mEq/L (ref 19–32)
Calcium: 10.5 mg/dL (ref 8.4–10.5)
Chloride: 99 mEq/L (ref 96–112)
Creatinine, Ser: 1.06 mg/dL (ref 0.40–1.20)
GFR: 52.81 mL/min — ABNORMAL LOW (ref >60.00)
Glucose, Bld: 95 mg/dL (ref 70–99)
Potassium: 5.1 mEq/L (ref 3.5–5.1)
Sodium: 134 mEq/L — ABNORMAL LOW (ref 135–145)
Total Bilirubin: 0.9 mg/dL (ref 0.2–1.2)
Total Protein: 7.7 g/dL (ref 6.0–8.3)

## 2020-03-09 LAB — LIPID PANEL
Cholesterol: 161 mg/dL (ref 0–200)
HDL: 65.9 mg/dL (ref >39.00)
LDL Cholesterol: 72 mg/dL (ref 0–99)
NonHDL: 95.55
Total CHOL/HDL Ratio: 2
Triglycerides: 120 mg/dL (ref 0.0–149.0)
VLDL: 24 mg/dL (ref 0.0–40.0)

## 2020-03-09 LAB — VITAMIN D 25 HYDROXY (VIT D DEFICIENCY, FRACTURES): VITD: 45.16 ng/mL (ref 30.00–100.00)

## 2020-03-09 LAB — HEMOGLOBIN A1C: Hgb A1c MFr Bld: 5.8 % (ref 4.6–6.5)

## 2020-03-09 NOTE — Progress Notes (Signed)
Subjective:   Diana Cook is a 70 y.o. female who presents for Medicare Annual (Subsequent) preventive examination.  Review of Systems: N/A    I connected with the patient today by telephone and verified that I am speaking with the correct person using two identifiers. Location patient: home Location nurse: work Persons participating in the telephone visit: patient, nurse.   I discussed the limitations, risks, security and privacy concerns of performing an evaluation and management service by telephone and the availability of in person appointments. I also discussed with the patient that there may be a patient responsible charge related to this service. The patient expressed understanding and verbally consented to this telephonic visit.        Cardiac Risk Factors include: advanced age (>66men, >3 women);Other (see comment), Risk factor comments: hyperlipidemia     Objective:    Today's Vitals   There is no height or weight on file to calculate BMI.  Advanced Directives 03/09/2020 12/05/2019 07/19/2019 04/27/2019 12/28/2018 08/19/2018 04/21/2018  Does Patient Have a Medical Advance Directive? Yes Yes Yes Yes Yes Yes No  Type of Paramedic of [place];Living will [place];Living will - [place];Living will [place];Living will [place];Living will -  Does patient want to make changes to medical advance directive? - No - Patient declined No - Patient declined No - Patient declined - - -  Copy of Micro in Chart? No - copy requested No - copy requested - No - copy requested No - copy requested No - copy requested -  Would patient like information on creating a medical advance directive? - - - - - - No - Patient declined    Current Medications (verified) Outpatient Encounter Medications as of 03/09/2020  Medication Sig  . acetaminophen (TYLENOL) 500 MG tablet  Take 500-1,000 mg by mouth every 6 (six) hours as needed for mild pain or headache.   . albuterol (VENTOLIN HFA) 108 (90 Base) MCG/ACT inhaler Inhale 2 puffs into the lungs every 4 (four) hours as needed for wheezing or shortness of breath.  Marland Kitchen aspirin EC 81 MG tablet Take 81 mg by mouth daily. Swallow whole.  Marland Kitchen atorvastatin (LIPITOR) 40 MG tablet Take 1 tablet (40 mg total) by mouth daily.  . Calcium Carb-Cholecalciferol (CALCIUM 600 + D PO) Take 1 tablet by mouth 2 (two) times daily.  Marland Kitchen FLUoxetine (PROZAC) 20 MG capsule Take 1 capsule by mouth once daily  . furosemide (LASIX) 40 MG tablet Take 1 tablet (40 mg total) by mouth daily as needed for edema (For weight gain or swelling.).  Marland Kitchen gabapentin (NEURONTIN) 100 MG capsule Take 1 capsule (100 mg total) by mouth 2 (two) times daily for 3 days, THEN 2 capsules (200 mg total) 2 (two) times daily for 3 days, THEN 3 capsules (300 mg total) 2 (two) times daily.  Marland Kitchen levETIRAcetam (KEPPRA) 250 MG tablet Take 250 mg by mouth 2 (two) times daily.   . OXYGEN Inhale 3 L into the lungs at bedtime.   Marland Kitchen STIOLTO RESPIMAT 2.5-2.5 MCG/ACT AERS INHALE 2 PUFFS BY MOUTH ONCE DAILY (Patient taking differently: Inhale 2 puffs into the lungs daily.)  . zolpidem (AMBIEN) 10 MG tablet TAKE 1/2 TO 1 (ONE-HALF TO ONE) TABLET BY MOUTH AT BEDTIME AS NEEDED FOR SLEEP (Patient taking differently: Take 10 mg by mouth at bedtime.)   No facility-administered encounter medications on file as of 03/09/2020.  Allergies (verified) Codeine and Morphine and related   History: Past Medical History:  Diagnosis Date  . Allergic rhinitis   . Arthritis   . Asthma    as a child, mild now  . Breast cancer ([place]) 12/2015   right breast cancer, lumpectomy and mammosite   . Colon polyps    colonoscopy 7/08, tubular adenoma  . Complete heart block ([place]) 8/13   s/p PPM implanted in [facility]  . COPD (chronic obstructive pulmonary disease) ([place])   . Myocardial infarction ([place]) 2011   . Osteopenia 10/2015  . Pacemaker    2011  . Personal history of radiation therapy 2017   right breast ca, mammosite placed  . Seizure disorder ([place])   . Seizures ([place])    first one was when she was 70 years old   . Small bowel obstruction ([place])    1988 and 2002  . Tobacco abuse    Past Surgical History:  Procedure Laterality Date  . ABDOMINAL HYSTERECTOMY    . BREAST BIOPSY Right 2007   benign inflammatory changes, mass due to underwire bra  . BREAST BIOPSY Left 01/02/2016   columnar cell changes without atypical hyperplasia.  Marland Kitchen BREAST BIOPSY Right 12/06/2015   rt breast mass 10:00, bx done at Dr. Curly [place] office, invasive ductal carcinoma  . BREAST EXCISIONAL BIOPSY Left 01/02/2016   COLUMNAR CELL CHANGE AND HYPERPLASIA ASSOCIATED WITH LUMINAL AND STROMAL CALCIFICATIONS  . BREAST LUMPECTOMY Right 01/02/2016   invasive mammary carcinoma, clear margins, negative LN  . BREAST LUMPECTOMY WITH SENTINEL LYMPH NODE BIOPSY Right 01/02/2016   pT1c, N0; ER/ PR 100%; Her 2 neu not over expressed: BREAST LUMPECTOMY WITH SENTINEL LYMPH NODE BX;  Surgeon: [name], MD;  Location: [facility] ORS;  Service: General;  Laterality: Right;  . CARDIAC CATHETERIZATION    . CATARACT EXTRACTION Bilateral   . COLONOSCOPY  10/2015   Dr Ardis Hughs  . COLONOSCOPY WITH PROPOFOL N/A 08/19/2018   Procedure: COLONOSCOPY WITH PROPOFOL;  Surgeon: Virgel Manifold, MD;  Location: [facility] ENDOSCOPY;  Service: Endoscopy;  Laterality: N/A;  . ESOPHAGOGASTRODUODENOSCOPY (EGD) WITH PROPOFOL N/A 08/19/2018   Procedure: ESOPHAGOGASTRODUODENOSCOPY (EGD) WITH PROPOFOL;  Surgeon: Virgel Manifold, MD;  Location: [facility] ENDOSCOPY;  Service: Endoscopy;  Laterality: N/A;  . EXPLORATORY LAPAROTOMY  01/25/2001   Exploratory laparotomy, lysis of adhesions, identification of internal hernia secondary to omental adhesion. Prolonged postoperative ileus.  Marland Kitchen gyn surgery  1993   hysterectomy- form endometriosis  . LAPAROSCOPY    .  PACEMAKER INSERTION  10/24/11   [employer] Advantio dual chamber PPM implanted by Dr Bunnie Philips at [facility] in [place]  . PPM GENERATOR CHANGEOUT N/A 12/05/2019   Procedure: PPM GENERATOR CHANGEOUT;  Surgeon: Deboraha Sprang, MD;  Location: [place] [facility];  Service: Cardiovascular;  Laterality: N/A;  . RIGHT/LEFT HEART CATH AND CORONARY ANGIOGRAPHY Bilateral 07/19/2019   Procedure: RIGHT/LEFT HEART CATH AND CORONARY ANGIOGRAPHY;  Surgeon: Nelva Bush, MD;  Location: [place] [facility];  Service: Cardiovascular;  Laterality: Bilateral;  . TEMPORARY PACEMAKER N/A 12/05/2019   Procedure: TEMPORARY PACEMAKER;  Surgeon: Deboraha Sprang, MD;  Location: [place] [facility];  Service: Cardiovascular;  Laterality: N/A;   Family History  Problem Relation Age of Onset  . Stroke Mother   . Heart disease Mother 60       MI and CABG  . Dementia Mother   . Coronary artery disease Father   . Parkinsonism Father   .  Cancer - Cervical Daughter 68       died 03-22-2022  . Heart attack Brother 25  . Colon cancer Neg Hx   . Breast cancer Neg Hx    Social History   Socioeconomic History  . Marital status: Married    Spouse name: Not on file  . Number of children: Not on file  . Years of education: Not on file  . Highest education level: Not on file  Occupational History    Employer: RETIRED  Tobacco Use  . Smoking status: Former Smoker    Packs/day: 1.00    Years: 42.00    Pack years: 42.00    Types: Cigarettes    Quit date: 11/11/2010    Years since quitting: 9.3  . Smokeless tobacco: Never Used  Vaping Use  . Vaping Use: Never used  Substance and Sexual Activity  . Alcohol use: Yes    Alcohol/week: 4.0 standard drinks    Types: 4 Glasses of wine per week    Comment: weekly  . Drug use: No  . Sexual activity: Never  Other Topics Concern  . Not on file  Social History Narrative   Married 40 years.   Exercises, does walking tapes   Social Determinants  of Health   Financial Resource Strain: Low Risk   . Difficulty of Paying Living Expenses: Not hard at all  Food Insecurity: No Food Insecurity  . Worried About Charity fundraiser in the Last Year: Never true  . Ran Out of Food in the Last Year: Never true  Transportation Needs: No Transportation Needs  . Lack of Transportation (Medical): No  . Lack of Transportation (Non-Medical): No  Physical Activity: Inactive  . Days of Exercise per Week: 0 days  . Minutes of Exercise per Session: 0 min  Stress: No Stress Concern Present  . Feeling of Stress : Not at all  Social Connections: Not on file    Tobacco Counseling Counseling given: Not Answered   Clinical Intake:  Pre-visit preparation completed: Yes  Pain : 0-10 Pain Type: Chronic pain Pain Location: Neck Pain Descriptors / Indicators: Aching Pain Onset: More than a month ago Pain Frequency: Intermittent     Nutritional Risks: None Diabetes: No  How often do you need to have someone help you when you read instructions, pamphlets, or other written materials from your doctor or pharmacy?: 1 - Never What is the last grade level you completed in school?: 2 years of college  Diabetic: No Nutrition Risk Assessment:  Has the patient had any N/V/D within the last 2 months?  No  Does the patient have any non-healing wounds?  No  Has the patient had any unintentional weight loss or weight gain?  No   Diabetes:  Is the patient diabetic?  No  If diabetic, was a CBG obtained today?  N/A Did the patient bring in their glucometer from home?  N/A How often do you monitor your CBG's? N/A.   Financial Strains and Diabetes Management:  Are you having any financial strains with the device, your supplies or your medication? N/A.  Does the patient want to be seen by Chronic Care Management for management of their diabetes?  N/A Would the patient like to be referred to a Nutritionist or for Diabetic Management?  N/A  Interpreter  Needed?: No  Information entered by :: CJohnson, LPN   Activities of Daily Living In your present state of health, do you have any difficulty performing the following activities:  03/09/2020 12/05/2019  Hearing? N N  Vision? N N  Difficulty concentrating or making decisions? N N  Walking or climbing stairs? N N  Dressing or bathing? N N  Doing errands, shopping? N -  Preparing Food and eating ? N -  Using the Toilet? N -  In the past six months, have you accidently leaked urine? N -  Do you have problems with loss of bowel control? N -  Managing your Medications? N -  Managing your Finances? N -  Housekeeping or managing your Housekeeping? N -  Some recent data might be hidden    Patient Care Team: Tower, Wynelle Fanny, MD as PCP - General Deboraha Sprang, MD as Consulting Physician (Cardiology) Lorelee Cover., MD as Referring Physician (Ophthalmology) Tower, Wynelle Fanny, MD as Consulting Physician (Family Medicine) Bary Castilla, Forest Gleason, MD (General Surgery)  Indicate any recent Medical Services you may have received from other than [facility] providers in the past year (date may be approximate).     Assessment:   This is a routine wellness examination for Shanik.  Hearing/Vision screen  Hearing Screening   125Hz  250Hz  500Hz  1000Hz  2000Hz  3000Hz  4000Hz  6000Hz  8000Hz   Right ear:           Left ear:           Vision Screening Comments: Patient gets annual eye exams  Dietary issues and exercise activities discussed: Current Exercise Habits: The patient does not participate in regular exercise at present, Exercise limited by: None identified  Goals    . Patient Stated     Starting 12/08/2017, I will continue to take medications as prescribed.     . Patient Stated     12/28/2018, I will try to start losing some weight in the future.    . Patient Stated     03/09/2020, I will maintain and continue medications as prescribed.      Depression Screen PHQ 2/9 Scores 03/09/2020 12/28/2018  12/08/2017 09/30/2016 07/26/2015 07/11/2014  PHQ - 2 Score 0 0 1 2 0 0  PHQ- 9 Score 0 0 7 4 - -    Fall Risk Fall Risk  03/09/2020 12/28/2018 12/08/2017 09/30/2016 07/26/2015  Falls in the past year? 1 1 No No No  Number falls in past yr: 0 0 - - -  Injury with Fall? 0 1 - - -  Comment - fractured wrist injury - - -  Risk for fall due to : No Fall Risks Medication side effect;History of fall(s) - - -  Follow up Falls evaluation completed;Falls prevention discussed Falls evaluation completed;Falls prevention discussed - - -    FALL RISK PREVENTION PERTAINING TO THE HOME:  Any stairs in or around the home? Yes  If so, are there any without handrails? No  Home free of loose throw rugs in walkways, pet beds, electrical cords, etc? Yes  Adequate lighting in your home to reduce risk of falls? Yes   ASSISTIVE DEVICES UTILIZED TO PREVENT FALLS:  Life alert? No  Use of a cane, walker or w/c? No  Grab bars in the bathroom? No  Shower chair or bench in shower? No  Elevated toilet seat or a handicapped toilet? No   TIMED UP AND GO:  Was the test performed? N/A telephone visit .    Cognitive Function: MMSE - Mini Mental State Exam 03/09/2020 12/28/2018 12/08/2017 09/30/2016 07/26/2015  Orientation to time 5 5 5 5 5   Orientation to Place 5 5 5 5  5  Registration 3 3 3 3 3   Attention/ Calculation 5 5 0 0 0  Recall 3 3 3 3 2   Recall-comments - - - - pt was unable to recall 1 of 3 words  Language- name 2 objects - - 0 0 0  Language- repeat 1 1 1 1 1   Language- follow 3 step command - - 3 3 3   Language- read & follow direction - - 0 0 0  Write a sentence - - 0 0 0  Copy design - - 0 0 0  Total score - - 20 20 19   Mini Cog  Mini-Cog screen was completed. Maximum score is 22. A value of 0 denotes this part of the MMSE was not completed or the patient failed this part of the Mini-Cog screening.       Immunizations Immunization History  Administered Date(s) Administered  . Influenza Whole  02/24/2005  . Influenza, High Dose Seasonal PF 11/04/2018, 11/22/2019  . Influenza,inj,Quad PF,6+ Mos 12/03/2012, 12/09/2013, 01/16/2015, 02/06/2016, 11/04/2016, 12/08/2017  . PFIZER SARS-COV-2 Vaccination 02/28/2019, 03/21/2019, 11/14/2019  . Pneumococcal Conjugate-13 07/11/2014  . Pneumococcal Polysaccharide-23 03/21/2005, 07/26/2015  . Td 01/09/1999, 06/14/2007  . Zoster 09/07/2015    TDAP status: Due, Education has been provided regarding the importance of this vaccine. Advised may receive this vaccine at local pharmacy or Health Dept. Aware to provide a copy of the vaccination record if obtained from local pharmacy or Health Dept. Verbalized acceptance and understanding.  Flu Vaccine status: Up to date  Pneumococcal vaccine status: Up to date  Covid-19 vaccine status: Completed vaccines  Qualifies for Shingles Vaccine? Yes   Zostavax completed Yes   Shingrix Completed?: No.    Education has been provided regarding the importance of this vaccine. Patient has been advised to call insurance company to determine out of pocket expense if they have not yet received this vaccine. Advised may also receive vaccine at local pharmacy or Health Dept. Verbalized acceptance and understanding.  Screening Tests Health Maintenance  Topic Date Due  . TETANUS/TDAP  03/10/2023 (Originally 06/13/2017)  . COVID-19 Vaccine (4 - Booster for Pfizer series) 05/13/2020  . MAMMOGRAM  12/26/2020  . COLONOSCOPY (Pts 45-29yrs Insurance coverage will need to be confirmed)  08/18/2021  . INFLUENZA VACCINE  Completed  . DEXA SCAN  Completed  . Hepatitis C Screening  Completed  . PNA vac Low Risk Adult  Completed    Health Maintenance  There are no preventive care reminders to display for this patient.  Colorectal cancer screening: Type of screening: Colonoscopy. Completed 08/19/2018. Repeat every 3 years  Mammogram status: Completed 12/27/2019. Repeat every year  Bone Density status: due, will discuss  with provider at physical  Lung Cancer Screening: (Low Dose CT Chest recommended if Age 19-80 years, 30 pack-year currently smoking OR have quit w/in 15years.) does not qualify.  Additional Screening:  Hepatitis C Screening: does qualify; Completed 09/30/2016  Vision Screening: Recommended annual ophthalmology exams for early detection of glaucoma and other disorders of the eye. Is the patient up to date with their annual eye exam?  Yes  Who is the provider or what is the name of the office in which the patient attends annual eye exams? Dr. Lorie Apley If pt is not established with a provider, would they like to be referred to a provider to establish care? No .   Dental Screening: Recommended annual dental exams for proper oral hygiene  Community Resource Referral / Chronic Care Management: CRR required this visit?  No   CCM required this visit?  No      Plan:     I have personally reviewed and noted the following in the patient's chart:   . Medical and social history . Use of alcohol, tobacco or illicit drugs  . Current medications and supplements . Functional ability and status . Nutritional status . Physical activity . Advanced directives . List of other physicians . Hospitalizations, surgeries, and ER visits in previous 12 months . Vitals . Screenings to include cognitive, depression, and falls . Referrals and appointments  In addition, I have reviewed and discussed with patient certain preventive protocols, quality metrics, and best practice recommendations. A written personalized care plan for preventive services as well as general preventive health recommendations were provided to patient.   Due to this being a telephonic visit, the after visit summary with patients personalized plan was offered to patient via office or my-chart. Patient preferred to pick up at office at next visit or via mychart.   Andrez Grime, LPN   04/07/9415

## 2020-03-09 NOTE — Patient Instructions (Signed)
Diana Cook , Thank you for taking time to come for your Medicare Wellness Visit. I appreciate your ongoing commitment to your health goals. Please review the following plan we discussed and let me know if I can assist you in the future.   Screening recommendations/referrals: Colonoscopy: Up to date, completed 08/19/2018, due 07/2021 Mammogram: Up to date, completed 12/27/2019, due 12/2020 Bone Density: due, will discuss with provider at physical  Recommended yearly ophthalmology/optometry visit for glaucoma screening and checkup Recommended yearly dental visit for hygiene and checkup  Vaccinations: Influenza vaccine: Up to date, completed 11/22/2019, due 09/2020 Pneumococcal vaccine: Completed series Tdap vaccine: decline-insurance Shingles vaccine: due, check withy your insurance regarding coverage if interested    Covid-19:Completed series  Advanced directives: Please bring a copy of your POA (Power of Attorney) and/or Living Will to your next appointment.   Conditions/risks identified: hyperlipidemia   Next appointment: Follow up in one year for your annual wellness visit    Preventive Care 65 Years and Older, Female Preventive care refers to lifestyle choices and visits with your health care provider that can promote health and wellness. What does preventive care include?  A yearly physical exam. This is also called an annual well check.  Dental exams once or twice a year.  Routine eye exams. Ask your health care provider how often you should have your eyes checked.  Personal lifestyle choices, including:  Daily care of your teeth and gums.  Regular physical activity.  Eating a healthy diet.  Avoiding tobacco and drug use.  Limiting alcohol use.  Practicing safe sex.  Taking low-dose aspirin every day.  Taking vitamin and mineral supplements as recommended by your health care provider. What happens during an annual well check? The services and screenings done by your  health care provider during your annual well check will depend on your age, overall health, lifestyle risk factors, and family history of disease. Counseling  Your health care provider may ask you questions about your:  Alcohol use.  Tobacco use.  Drug use.  Emotional well-being.  Home and relationship well-being.  Sexual activity.  Eating habits.  History of falls.  Memory and ability to understand (cognition).  Work and work Statistician.  Reproductive health. Screening  You may have the following tests or measurements:  Height, weight, and BMI.  Blood pressure.  Lipid and cholesterol levels. These may be checked every 5 years, or more frequently if you are over 69 years old.  Skin check.  Lung cancer screening. You may have this screening every year starting at age 78 if you have a 30-pack-year history of smoking and currently smoke or have quit within the past 15 years.  Fecal occult blood test (FOBT) of the stool. You may have this test every year starting at age 45.  Flexible sigmoidoscopy or colonoscopy. You may have a sigmoidoscopy every 5 years or a colonoscopy every 10 years starting at age 32.  Hepatitis C blood test.  Hepatitis B blood test.  Sexually transmitted disease (STD) testing.  Diabetes screening. This is done by checking your blood sugar (glucose) after you have not eaten for a while (fasting). You may have this done every 1-3 years.  Bone density scan. This is done to screen for osteoporosis. You may have this done starting at age 74.  Mammogram. This may be done every 1-2 years. Talk to your health care provider about how often you should have regular mammograms. Talk with your health care provider about your test results,  treatment options, and if necessary, the need for more tests. Vaccines  Your health care provider may recommend certain vaccines, such as:  Influenza vaccine. This is recommended every year.  Tetanus, diphtheria, and  acellular pertussis (Tdap, Td) vaccine. You may need a Td booster every 10 years.  Zoster vaccine. You may need this after age 27.  Pneumococcal 13-valent conjugate (PCV13) vaccine. One dose is recommended after age 107.  Pneumococcal polysaccharide (PPSV23) vaccine. One dose is recommended after age 53. Talk to your health care provider about which screenings and vaccines you need and how often you need them. This information is not intended to replace advice given to you by your health care provider. Make sure you discuss any questions you have with your health care provider. Document Released: 03/09/2015 Document Revised: 10/31/2015 Document Reviewed: 12/12/2014 Elsevier Interactive Patient Education  2017 [place] Prevention in the Home Falls can cause injuries. They can happen to people of all ages. There are many things you can do to make your home safe and to help prevent falls. What can I do on the outside of my home?  Regularly fix the edges of walkways and driveways and fix any cracks.  Remove anything that might make you trip as you walk through a door, such as a raised step or threshold.  Trim any bushes or trees on the path to your home.  Use bright outdoor lighting.  Clear any walking paths of anything that might make someone trip, such as rocks or tools.  Regularly check to see if handrails are loose or broken. Make sure that both sides of any steps have handrails.  Any raised decks and porches should have guardrails on the edges.  Have any leaves, snow, or ice cleared regularly.  Use sand or salt on walking paths during winter.  Clean up any spills in your garage right away. This includes oil or grease spills. What can I do in the bathroom?  Use night lights.  Install grab bars by the toilet and in the tub and shower. Do not use towel bars as grab bars.  Use non-skid mats or decals in the tub or shower.  If you need to sit down in the shower, use  a plastic, non-slip stool.  Keep the floor dry. Clean up any water that spills on the floor as soon as it happens.  Remove soap buildup in the tub or shower regularly.  Attach bath mats securely with double-sided non-slip rug tape.  Do not have throw rugs and other things on the floor that can make you trip. What can I do in the bedroom?  Use night lights.  Make sure that you have a light by your bed that is easy to reach.  Do not use any sheets or blankets that are too big for your bed. They should not hang down onto the floor.  Have a firm chair that has side arms. You can use this for support while you get dressed.  Do not have throw rugs and other things on the floor that can make you trip. What can I do in the kitchen?  Clean up any spills right away.  Avoid walking on wet floors.  Keep items that you use a lot in easy-to-reach places.  If you need to reach something above you, use a strong step stool that has a grab bar.  Keep electrical cords out of the way.  Do not use floor polish or wax that makes floors  slippery. If you must use wax, use non-skid floor wax.  Do not have throw rugs and other things on the floor that can make you trip. What can I do with my stairs?  Do not leave any items on the stairs.  Make sure that there are handrails on both sides of the stairs and use them. Fix handrails that are broken or loose. Make sure that handrails are as long as the stairways.  Check any carpeting to make sure that it is firmly attached to the stairs. Fix any carpet that is loose or worn.  Avoid having throw rugs at the top or bottom of the stairs. If you do have throw rugs, attach them to the floor with carpet tape.  Make sure that you have a light switch at the top of the stairs and the bottom of the stairs. If you do not have them, ask someone to add them for you. What else can I do to help prevent falls?  Wear shoes that:  Do not have high heels.  Have  rubber bottoms.  Are comfortable and fit you well.  Are closed at the toe. Do not wear sandals.  If you use a stepladder:  Make sure that it is fully opened. Do not climb a closed stepladder.  Make sure that both sides of the stepladder are locked into place.  Ask someone to hold it for you, if possible.  Clearly mark and make sure that you can see:  Any grab bars or handrails.  First and last steps.  Where the edge of each step is.  Use tools that help you move around (mobility aids) if they are needed. These include:  Canes.  Walkers.  Scooters.  Crutches.  Turn on the lights when you go into a dark area. Replace any light bulbs as soon as they burn out.  Set up your furniture so you have a clear path. Avoid moving your furniture around.  If any of your floors are uneven, fix them.  If there are any pets around you, be aware of where they are.  Review your medicines with your doctor. Some medicines can make you feel dizzy. This can increase your chance of falling. Ask your doctor what other things that you can do to help prevent falls. This information is not intended to replace advice given to you by your health care provider. Make sure you discuss any questions you have with your health care provider. Document Released: 12/07/2008 Document Revised: 07/19/2015 Document Reviewed: 03/17/2014 Elsevier Interactive Patient Education  2017 [employer].

## 2020-03-09 NOTE — Telephone Encounter (Signed)
I called patient and let her know the times of her appointments.

## 2020-03-09 NOTE — Progress Notes (Signed)
PCP notes:  Health Maintenance: Tdap- insurance Dexa- due   Abnormal Screenings: none   Patient concerns: none   Nurse concerns: none   Next PCP appt.: 03/13/2020 @ 2:30 pm

## 2020-03-09 NOTE — Telephone Encounter (Signed)
[facility] Night - Client Nonclinical Telephone Record  [facility] Client [facility] Night - Client Client Site [place] Physician Loura Pardon - MD Contact Type Call Who Is Calling Patient / Member / Family / Caregiver Caller Name [place] Phone Number [phone] Patient Name Diana Cook Patient DOB [birth year] Call Type Message Only Information Provided Reason for Call Request for General Office Information Initial Comment Caller states she needs to know what time her appointment today. Disp. Time Disposition Final User 03/09/2020 7:57:03 AM General Information Provided Yes Baruch Goldmann Call Closed By: Baruch Goldmann Transaction Date/Time: 03/09/2020 7:55:12 AM (ET)

## 2020-03-13 ENCOUNTER — Encounter: Payer: Medicare Other | Admitting: Internal Medicine

## 2020-03-13 ENCOUNTER — Ambulatory Visit: Payer: Medicare Other | Admitting: Family Medicine

## 2020-03-19 ENCOUNTER — Other Ambulatory Visit: Payer: Self-pay | Admitting: Family Medicine

## 2020-03-20 ENCOUNTER — Telehealth: Payer: Self-pay | Admitting: Pulmonary Disease

## 2020-03-20 DIAGNOSIS — J432 Centrilobular emphysema: Secondary | ICD-10-CM

## 2020-03-20 NOTE — Telephone Encounter (Signed)
It looks like an order was placed on 09/16/19. Let me know if this doesn't work and we can put in a new order.

## 2020-03-20 NOTE — Telephone Encounter (Signed)
CM sent to [facility] about the new ONO order being placed. I have also spoken with Mrs. Perlmutter and she is aware that [facility] should be getting in touch with her about the ONO

## 2020-03-20 NOTE — Telephone Encounter (Signed)
I spoke with Bethanne Ginger with [facility] and she stated that they did get the new ONO order on 2.5 liters placed on 09/16/19 and she is not sure why that ONO was not done.  Bethanne Ginger states a new ONO order will have to be placed

## 2020-03-20 NOTE — Telephone Encounter (Signed)
ONO has been ordered.

## 2020-03-20 NOTE — Telephone Encounter (Signed)
We need an order for this thanks Joellen Jersey

## 2020-03-21 ENCOUNTER — Encounter: Payer: Self-pay | Admitting: Family Medicine

## 2020-03-21 ENCOUNTER — Ambulatory Visit (INDEPENDENT_AMBULATORY_CARE_PROVIDER_SITE_OTHER): Payer: Medicare Other | Admitting: Family Medicine

## 2020-03-21 ENCOUNTER — Other Ambulatory Visit: Payer: Self-pay

## 2020-03-21 ENCOUNTER — Ambulatory Visit (INDEPENDENT_AMBULATORY_CARE_PROVIDER_SITE_OTHER): Payer: Medicare Other | Admitting: Pulmonary Disease

## 2020-03-21 ENCOUNTER — Encounter: Payer: Self-pay | Admitting: Pulmonary Disease

## 2020-03-21 VITALS — BP 130/72 | HR 89 | Temp 97.1°F | Ht 68.0 in | Wt 180.0 lb

## 2020-03-21 VITALS — BP 104/70 | HR 93 | Temp 97.8°F | Ht 68.0 in | Wt 180.0 lb

## 2020-03-21 DIAGNOSIS — J9611 Chronic respiratory failure with hypoxia: Secondary | ICD-10-CM

## 2020-03-21 DIAGNOSIS — G4736 Sleep related hypoventilation in conditions classified elsewhere: Secondary | ICD-10-CM

## 2020-03-21 DIAGNOSIS — I442 Atrioventricular block, complete: Secondary | ICD-10-CM | POA: Diagnosis not present

## 2020-03-21 DIAGNOSIS — J432 Centrilobular emphysema: Secondary | ICD-10-CM

## 2020-03-21 DIAGNOSIS — M810 Age-related osteoporosis without current pathological fracture: Secondary | ICD-10-CM

## 2020-03-21 DIAGNOSIS — Z87891 Personal history of nicotine dependence: Secondary | ICD-10-CM | POA: Diagnosis not present

## 2020-03-21 DIAGNOSIS — J439 Emphysema, unspecified: Secondary | ICD-10-CM | POA: Diagnosis not present

## 2020-03-21 DIAGNOSIS — Z0000 Encounter for general adult medical examination without abnormal findings: Secondary | ICD-10-CM

## 2020-03-21 DIAGNOSIS — R7309 Other abnormal glucose: Secondary | ICD-10-CM

## 2020-03-21 DIAGNOSIS — R0602 Shortness of breath: Secondary | ICD-10-CM | POA: Diagnosis not present

## 2020-03-21 DIAGNOSIS — I5032 Chronic diastolic (congestive) heart failure: Secondary | ICD-10-CM | POA: Diagnosis not present

## 2020-03-21 DIAGNOSIS — L988 Other specified disorders of the skin and subcutaneous tissue: Secondary | ICD-10-CM

## 2020-03-21 DIAGNOSIS — R569 Unspecified convulsions: Secondary | ICD-10-CM

## 2020-03-21 DIAGNOSIS — E785 Hyperlipidemia, unspecified: Secondary | ICD-10-CM | POA: Diagnosis not present

## 2020-03-21 MED ORDER — STIOLTO RESPIMAT 2.5-2.5 MCG/ACT IN AERS: 2.0000 | INHALATION_SPRAY | Freq: Every day | RESPIRATORY_TRACT | 0 refills | Status: DC

## 2020-03-21 MED ORDER — ALBUTEROL SULFATE HFA 108 (90 BASE) MCG/ACT IN AERS: 2.0000 | INHALATION_SPRAY | RESPIRATORY_TRACT | 1 refills | Status: DC | PRN

## 2020-03-21 MED ORDER — TRETINOIN 0.1 % EX CREA
TOPICAL_CREAM | Freq: Every day | CUTANEOUS | 1 refills | Status: DC
Start: 2020-03-21 — End: 2022-02-21

## 2020-03-21 MED ORDER — FLUOXETINE HCL 20 MG PO CAPS
20.0000 mg | ORAL_CAPSULE | Freq: Every day | ORAL | 3 refills | Status: DC
Start: 2020-03-21 — End: 2020-04-05

## 2020-03-21 NOTE — Assessment & Plan Note (Signed)
Disc goals for lipids and reasons to control them Rev last labs with pt Rev low sat fat diet in detail Controlled with atorvastatin 40 mg daily

## 2020-03-21 NOTE — Progress Notes (Unsigned)
Subjective:    Patient ID: Diana Cook, female    DOB: Aug 03, 1950, 70 y.o.   MRN: [id number]  HPI Patient is a 70 year old former smoker who follows here for the issue of dyspnea on exertion emphysema with preserved FEV1 and nocturnal hypoxemia related to emphysema.  She is compliant with oxygen at 3 L/min.  Notes improvement on her wellbeing with this.  She also has history of complete heart block and is status post pacemaker placement.  Pacer generator was recently exchanged and pocket revised on October 2021 by Dr. Caryl Comes.  She has mild to moderate nonobstructive coronary artery disease with mildly elevated right heart and pulmonary artery pressures and without intracardiac shunt (by right and left heart cath 06/2019).  She did have increased need for diuresis after her right and left heart cath showed elevated left and right heart filling pressures.  She presents today doing well from dyspnea standpoint however she has been having episodes of hypotension and dizziness upon exercise.  She continues to be evaluated by cardiology in this regard.    She has not had any fevers, chills or sweats.  No cough or sputum production.  Dyspnea is at baseline with good control with Stiolto.  She is compliant with oxygen as noted above.    Review of Systems A 10 point review of systems was performed and it is as noted above otherwise negative.  Patient Active Problem List   Diagnosis Date Noted  . Radicular pain in left arm 03/02/2020  . Neck pain on left side 02/15/2020  . Paresthesia 02/15/2020  . Chronic respiratory failure with hypoxia ([place]) 11/17/2019  . Chronic heart failure with preserved ejection fraction (HFpEF) ([place]) 08/11/2019  . Atypical chest pain 06/23/2019  . CAD in native artery 06/20/2019  . Gastroesophageal reflux disease   . Stomach irritation   . Abnormal CT scan, esophagus   . Polyp of ascending colon   . Personal history of tobacco use, presenting hazards to health 01/18/2018   . Grade I diastolic dysfunction 40/09/6759  . Globus pharyngeus 12/21/2017  . Chronic obstructive pulmonary disease ([place]) 12/15/2017  . Hyperlipidemia LDL goal <70 12/07/2017  . Seizures ([place]) 10/01/2016  . Elevated glucose 08/31/2016  . Grief reaction 04/11/2016  . Malignant neoplasm of upper-outer quadrant of right breast in female, estrogen receptor positive ([place]) 12/11/2015  . Osteoporosis 11/22/2015  . Estrogen deficiency 09/07/2015  . Bruising 09/07/2015  . Wrinkles 09/07/2015  . Post herpetic neuralgia 09/01/2014  . Encounter for Medicare annual wellness exam 07/11/2014  . Routine general medical examination at a health care facility 06/26/2013  . H/O small bowel obstruction 04/15/2013  . Dyspnea on exertion 02/10/2013  . Complete heart block ([place]) 11/03/2011  . [employer] 11/03/2011  . Lumbar spinal stenosis 09/02/2011  . POSTMENOPAUSAL STATUS 08/06/2009  . HIDRADENITIS SUPPURATIVA 06/26/2008  . HERNIATED DISC 12/14/2007  . BACK, LOWER, PAIN 12/02/2007  . HX, PERSONAL, COLONIC POLYPS 09/14/2006  . Former smoker 06/10/2006  . ASTHMA 06/10/2006  . H/O idiopathic seizure 06/10/2006   Allergies  Allergen Reactions  . Codeine Nausea And Vomiting  . Morphine And Related Nausea Only   Current Meds  Medication Sig  . acetaminophen (TYLENOL) 500 MG tablet Take 500-1,000 mg by mouth every 6 (six) hours as needed for mild pain or headache.   . albuterol (VENTOLIN HFA) 108 (90 Base) MCG/ACT inhaler Inhale 2 puffs into the lungs every 4 (four) hours as needed for wheezing or shortness of breath.  Marland Kitchen  aspirin EC 81 MG tablet Take 81 mg by mouth daily. Swallow whole.  Marland Kitchen atorvastatin (LIPITOR) 40 MG tablet Take 1 tablet (40 mg total) by mouth daily.  . Calcium Carb-Cholecalciferol (CALCIUM 600 + D PO) Take 1 tablet by mouth 2 (two) times daily.  Marland Kitchen FLUoxetine (PROZAC) 20 MG capsule Take 1 capsule by mouth once daily  . furosemide (LASIX) 40 MG tablet Take 1 tablet  (40 mg total) by mouth daily as needed for edema (For weight gain or swelling.).  Marland Kitchen gabapentin (NEURONTIN) 100 MG capsule Take 3 capsules (300 mg total) by mouth 2 (two) times daily.  Marland Kitchen levETIRAcetam (KEPPRA) 250 MG tablet Take 250 mg by mouth 2 (two) times daily.   . OXYGEN Inhale 3 L into the lungs at bedtime.   Marland Kitchen STIOLTO RESPIMAT 2.5-2.5 MCG/ACT AERS INHALE 2 PUFFS BY MOUTH ONCE DAILY (Patient taking differently: Inhale 2 puffs into the lungs daily.)  . zolpidem (AMBIEN) 10 MG tablet TAKE 1/2 TO 1 (ONE-HALF TO ONE) TABLET BY MOUTH AT BEDTIME AS NEEDED FOR SLEEP (Patient taking differently: Take 10 mg by mouth at bedtime.)   Immunization History  Administered Date(s) Administered  . Influenza Whole 02/24/2005  . Influenza, High Dose Seasonal PF 11/04/2018, 11/22/2019  . Influenza,inj,Quad PF,6+ Mos 12/03/2012, 12/09/2013, 01/16/2015, 02/06/2016, 11/04/2016, 12/08/2017  . PFIZER(Purple Top)SARS-COV-2 Vaccination 02/28/2019, 03/21/2019, 11/14/2019  . Pneumococcal Conjugate-13 07/11/2014  . Pneumococcal Polysaccharide-23 03/21/2005, 07/26/2015  . Td 01/09/1999, 06/14/2007  . Zoster 09/07/2015        Objective:   Physical Exam BP 130/72 (BP Location: Left Arm, Cuff Size: Normal)   Pulse 89   Temp (!) 97.1 F (36.2 C) (Temporal)   Ht 5\' 8"  (1.727 m)   Wt 180 lb (81.6 kg)   SpO2 93%   BMI 27.37 kg/m   GENERAL: Well-developed well-nourished woman in no acute respiratory distress. She is fully ambulatory. HEAD: Normocephalic, atraumatic.  EYES: Pupils equal, round, reactive to light. No scleral icterus.  MOUTH: Nose/mouth/throat not examined due to masking requirements for COVID 19. NECK: Supple. No thyromegaly. No nodules. No JVD. Trachea midline. PULMONARY: Symmetrical air entry, clear with no adventitious sounds CARDIOVASCULAR: S1 and S2. Regular rate and rhythm. No rubs murmurs gallops heard. GASTROINTESTINAL: Nondistended. MUSCULOSKELETAL: No joint deformity, no clubbing,  no edema.  NEUROLOGIC: Awake, alert, oriented x4, no focal deficits. SKIN: Intact,warm,dry, on limited exam no rashes. PSYCH: Normal mood and behavior.   Ambulatory oximetry on room air: Performed today and shows no significant oxygen desaturation with exercise baseline oxygen saturation at 93%, nadir at 90%.     Assessment & Plan:     ICD-10-CM   1. Centrilobular emphysema ([place])  J43.2    She has preserved FEV1 Continue Stiolto  2. SOB (shortness of breath) on exertion  R06.02    Well-controlled with Stiolto  3. Nocturnal hypoxemia due to emphysema ([facility])  J43.9    G47.36    Continue oxygen at 3 L/min with sleep  4. Ex-heavy cigarette smoker (20-39 per day)  Z87.891    She remains abstinent of cigarettes   Meds ordered this encounter  Medications  . Tiotropium Bromide-Olodaterol (STIOLTO RESPIMAT) 2.5-2.5 MCG/ACT AERS    Sig: Inhale 2 puffs into the lungs daily.    Dispense:  4 g    Refill:  0    Order Specific Question:   Lot Number?    Answer:   [id number]    Order Specific Question:   Expiration Date?    Answer:  09/24/2021    Order Specific Question:   Quantity    Answer:   1   Discussion:  Pulmonary standpoint, she is doing well.  Continue Stiolto.  Continue oxygen nocturnally.  She has issues with dizziness during exertion this is being evaluated by cardiology.  She has also noted decreased blood pressure during these times.  Query whether she may need a tilt table test.  Her pacemaker generator was recently exchanged.  We will see her in follow-up in 3 months time she is to contact us prior to that time should any new difficulties arise.  Renold Don, MD [facility]   *This note was dictated using voice recognition software/Dragon.  Despite best efforts to proofread, errors can occur which can change the meaning.  Any change was purely unintentional.

## 2020-03-21 NOTE — Progress Notes (Signed)
Subjective:    Patient ID: Diana Cook, female    DOB: October 11, 1950, 70 y.o.   MRN: [id number]  This visit occurred during the SARS-CoV-2 public health emergency.  Safety protocols were in place, including screening questions prior to the visit, additional usage of staff PPE, and extensive cleaning of exam room while observing appropriate contact time as indicated for disinfecting solutions.    HPI Pt presents for amw and annual f/u of chronic health problems   I have personally reviewed the Medicare Annual Wellness questionnaire and have noted 1. The patient's medical and social history 2. Their use of alcohol, tobacco or illicit drugs 3. Their current medications and supplements 4. The patient's functional ability including ADL's, fall risks, home safety risks and hearing or visual             impairment. 5. Diet and physical activities 6. Evidence for depression or mood disorders  The patients weight, height, BMI have been recorded in the chart and visual acuity is per eye clinic.  I have made referrals, counseling and provided education to the patient based review of the above and I have provided the pt with a written personalized care plan for preventive services. Reviewed and updated provider list, see scanned forms.  Has been feeling well  Tough time taking care of grandkids/caring for self   See scanned forms.  Routine anticipatory guidance given to patient.  See health maintenance. Colon cancer screening 6/20 colonoscopy with 3 y recall  Breast cancer screening mammogram 11/21 Self breast exam- no changes  Personal h/o breast cancer  Flu vaccine 9/21 Tetanus vaccine 4/09 Pneumovax complete covid vaccinated with booster  Zoster vaccine-zostavax 2017 Dexa 12/19 OP of forearm /will hold off on  Falls- 2 Fractures- none Supplements ca, D  Exercise - cannot get out and walk like she used to  Using video  Advance directive- utd  Cognitive function addressed- see  scanned forms- and if abnormal then additional documentation follows.   No concerns about memory  No confusion   PMH and SH reviewed  Meds, vitals, and allergies reviewed.   ROS: See HPI.  Otherwise negative.    Weight :  Wt Readings from Last 3 Encounters:  03/21/20 180 lb (81.6 kg)  03/21/20 180 lb (81.6 kg)  02/15/20 183 lb 7 oz (83.2 kg)   27.37 kg/m   Hearing/vision: Hearing screen done here 12/22 and reviewed - all Hz herd   utd vision /eye exam in oct   Care team Chakita Mcgraw-pcp Klein-cardiol Bell-oph byrnett-surg   BP Readings from Last 3 Encounters:  03/21/20 104/70  03/21/20 130/72  02/15/20 124/72   Struggling with orthostatic hypotension -has fallen  Takes lasix  Pulse Readings from Last 3 Encounters:  03/21/20 93  03/21/20 89  02/15/20 70   H/o CAD and CHF and copd   Hyperlipidemia Lab Results  Component Value Date   CHOL 161 03/09/2020   CHOL 136 11/16/2019   CHOL 205 (H) 12/28/2018   Lab Results  Component Value Date   HDL 65.90 03/09/2020   HDL 57 11/16/2019   HDL 63.30 12/28/2018   Lab Results  Component Value Date   LDLCALC 72 03/09/2020   LDLCALC 59 11/16/2019   LDLCALC 124 (H) 12/28/2018   Lab Results  Component Value Date   TRIG 120.0 03/09/2020   TRIG 109 11/16/2019   TRIG 90.0 12/28/2018   Lab Results  Component Value Date   CHOLHDL 2 03/09/2020   CHOLHDL 2.4 11/16/2019  CHOLHDL 3 12/28/2018   No results found for: LDLDIRECT Atorvastatin 40 mg daily    Glucose Lab Results  Component Value Date   HGBA1C 5.8 03/09/2020   Lab Results  Component Value Date   WBC 9.1 03/09/2020   HGB 13.8 03/09/2020   HCT 40.4 03/09/2020   MCV 88.6 03/09/2020   PLT 318.0 03/09/2020   Lab Results  Component Value Date   CREATININE 1.06 03/09/2020   BUN 32 (H) 03/09/2020   NA 134 (L) 03/09/2020   K 5.1 03/09/2020   CL 99 03/09/2020   CO2 28 03/09/2020   Lab Results  Component Value Date   ALT 18 03/09/2020   AST 18  03/09/2020   ALKPHOS 87 03/09/2020   BILITOT 0.9 03/09/2020   Patient Active Problem List   Diagnosis Date Noted  . Radicular pain in left arm 03/02/2020  . Neck pain on left side 02/15/2020  . Paresthesia 02/15/2020  . Chronic respiratory failure with hypoxia ([place]) 11/17/2019  . Chronic heart failure with preserved ejection fraction (HFpEF) ([place]) 08/11/2019  . CAD in native artery 06/20/2019  . Gastroesophageal reflux disease   . Stomach irritation   . Abnormal CT scan, esophagus   . Polyp of ascending colon   . Personal history of tobacco use, presenting hazards to health 01/18/2018  . Grade I diastolic dysfunction 26/94/8546  . Globus pharyngeus 12/21/2017  . Chronic obstructive pulmonary disease ([place]) 12/15/2017  . Hyperlipidemia LDL goal <70 12/07/2017  . Seizures ([place]) 10/01/2016  . Elevated glucose 08/31/2016  . Grief reaction 04/11/2016  . Malignant neoplasm of upper-outer quadrant of right breast in female, estrogen receptor positive ([place]) 12/11/2015  . Osteoporosis 11/22/2015  . Estrogen deficiency 09/07/2015  . Bruising 09/07/2015  . Wrinkles 09/07/2015  . Post herpetic neuralgia 09/01/2014  . Encounter for Medicare annual wellness exam 07/11/2014  . Routine general medical examination at a health care facility 06/26/2013  . H/O small bowel obstruction 04/15/2013  . Dyspnea on exertion 02/10/2013  . Complete heart block ([place]) 11/03/2011  . [employer] 11/03/2011  . Lumbar spinal stenosis 09/02/2011  . POSTMENOPAUSAL STATUS 08/06/2009  . HIDRADENITIS SUPPURATIVA 06/26/2008  . HERNIATED DISC 12/14/2007  . BACK, LOWER, PAIN 12/02/2007  . HX, PERSONAL, COLONIC POLYPS 09/14/2006  . Former smoker 06/10/2006  . ASTHMA 06/10/2006  . H/O idiopathic seizure 06/10/2006   Past Medical History:  Diagnosis Date  . Allergic rhinitis   . Arthritis   . Asthma    as a child, mild now  . Breast cancer ([place]) 12/2015   right breast cancer, lumpectomy and  mammosite   . Colon polyps    colonoscopy 7/08, tubular adenoma  . Complete heart block ([place]) 8/13   s/p PPM implanted in [facility]  . COPD (chronic obstructive pulmonary disease) ([place])   . Myocardial infarction ([place]) 2011  . Osteopenia 10/2015  . Pacemaker    2011  . Personal history of radiation therapy 2017   right breast ca, mammosite placed  . Seizure disorder ([place])   . Seizures ([place])    first one was when she was 71 years old   . Small bowel obstruction ([place])    1988 and 2002  . Tobacco abuse    Past Surgical History:  Procedure Laterality Date  . ABDOMINAL HYSTERECTOMY    . BREAST BIOPSY Right 2007   benign inflammatory changes, mass due to underwire bra  . BREAST BIOPSY Left 01/02/2016   columnar cell changes without atypical  hyperplasia.  Marland Kitchen BREAST BIOPSY Right 12/06/2015   rt breast mass 10:00, bx done at Dr. Curly Shores office, invasive ductal carcinoma  . BREAST EXCISIONAL BIOPSY Left 01/02/2016   COLUMNAR CELL CHANGE AND HYPERPLASIA ASSOCIATED WITH LUMINAL AND STROMAL CALCIFICATIONS  . BREAST LUMPECTOMY Right 01/02/2016   invasive mammary carcinoma, clear margins, negative LN  . BREAST LUMPECTOMY WITH SENTINEL LYMPH NODE BIOPSY Right 01/02/2016   pT1c, N0; ER/ PR 100%; Her 2 neu not over expressed: BREAST LUMPECTOMY WITH SENTINEL LYMPH NODE BX;  Surgeon: [name], MD;  Location: [facility] OR[facility];  Service: General;  Laterality: Right;  . CARDIAC CATHETERIZATION    . CATARACT EXTRACTION Bilateral   . COLONOSCOPY  10/2015   Dr Ardis Hughs  . COLONOSCOPY WITH PROPOFOL N/A 08/19/2018   Procedure: COLONOSCOPY WITH PROPOFOL;  Surgeon: Virgel Manifold, MD;  Location: [facility] ENDOSCOPY;  Service: Endoscopy;  Laterality: N/A;  . ESOPHAGOGASTRODUODENOSCOPY (EGD) WITH PROPOFOL N/A 08/19/2018   Procedure: ESOPHAGOGASTRODUODENOSCOPY (EGD) WITH PROPOFOL;  Surgeon: Virgel Manifold, MD;  Location: [facility] ENDOSCOPY;  Service: Endoscopy;  Laterality: N/A;  . EXPLORATORY LAPAROTOMY   01/25/2001   Exploratory laparotomy, lysis of adhesions, identification of internal hernia secondary to omental adhesion. Prolonged postoperative ileus.  Marland Kitchen gyn surgery  1993   hysterectomy- form endometriosis  . LAPAROSCOPY    . PACEMAKER INSERTION  10/24/11   [employer] Advantio dual chamber PPM implanted by Dr Bunnie Philips at [facility] in [place]  . PPM GENERATOR CHANGEOUT N/A 12/05/2019   Procedure: PPM GENERATOR CHANGEOUT;  Surgeon: Deboraha Sprang, MD;  Location: [place] [facility];  Service: Cardiovascular;  Laterality: N/A;  . RIGHT/LEFT HEART CATH AND CORONARY ANGIOGRAPHY Bilateral 07/19/2019   Procedure: RIGHT/LEFT HEART CATH AND CORONARY ANGIOGRAPHY;  Surgeon: Nelva Bush, MD;  Location: [facility];  Service: Cardiovascular;  Laterality: Bilateral;  . TEMPORARY PACEMAKER N/A 12/05/2019   Procedure: TEMPORARY PACEMAKER;  Surgeon: Deboraha Sprang, MD;  Location: [place] [facility];  Service: Cardiovascular;  Laterality: N/A;   Social History   Tobacco Use  . Smoking status: Former Smoker    Packs/day: 1.00    Years: 42.00    Pack years: 42.00    Types: Cigarettes    Quit date: 11/11/2010    Years since quitting: 9.3  . Smokeless tobacco: Never Used  Vaping Use  . Vaping Use: Never used  Substance Use Topics  . Alcohol use: Yes    Alcohol/week: 4.0 standard drinks    Types: 4 Glasses of wine per week    Comment: weekly  . Drug use: No   Family History  Problem Relation Age of Onset  . Stroke Mother   . Heart disease Mother 14       MI and CABG  . Dementia Mother   . Coronary artery disease Father   . Parkinsonism Father   . Cancer - Cervical Daughter 34       died 04/10/2022  . Heart attack Brother 36  . Colon cancer Neg Hx   . Breast cancer Neg Hx    Allergies  Allergen Reactions  . Codeine Nausea And Vomiting  . Morphine And Related Nausea Only   Current Outpatient Medications on File Prior to Visit  Medication Sig  Dispense Refill  . acetaminophen (TYLENOL) 500 MG tablet Take 500-1,000 mg by mouth every 6 (six) hours as needed for mild pain or headache.     Marland Kitchen aspirin EC 81 MG tablet Take 81 mg  by mouth daily. Swallow whole.    Marland Kitchen atorvastatin (LIPITOR) 40 MG tablet Take 1 tablet (40 mg total) by mouth daily. 90 tablet 1  . Calcium Carb-Cholecalciferol (CALCIUM 600 + D PO) Take 1 tablet by mouth 2 (two) times daily.    . furosemide (LASIX) 40 MG tablet Take 1 tablet (40 mg total) by mouth daily as needed for edema (For weight gain or swelling.). 90 tablet 3  . gabapentin (NEURONTIN) 100 MG capsule Take 3 capsules (300 mg total) by mouth 2 (two) times daily. 90 capsule 0  . levETIRAcetam (KEPPRA) 250 MG tablet Take 250 mg by mouth 2 (two) times daily.     . OXYGEN Inhale 3 L into the lungs at bedtime.     Marland Kitchen STIOLTO RESPIMAT 2.5-2.5 MCG/ACT AERS INHALE 2 PUFFS BY MOUTH ONCE DAILY (Patient taking differently: Inhale 2 puffs into the lungs daily.) 4 g 5  . zolpidem (AMBIEN) 10 MG tablet TAKE 1/2 TO 1 (ONE-HALF TO ONE) TABLET BY MOUTH AT BEDTIME AS NEEDED FOR SLEEP (Patient taking differently: Take 10 mg by mouth at bedtime.) 30 tablet 5   No current facility-administered medications on file prior to visit.     Review of Systems  Constitutional: Positive for fatigue. Negative for activity change, appetite change, fever and unexpected weight change.  HENT: Negative for congestion, ear pain, rhinorrhea, sinus pressure and sore throat.   Eyes: Negative for pain, redness and visual disturbance.  Respiratory: Negative for cough, shortness of breath and wheezing.   Cardiovascular: Negative for chest pain and palpitations.  Gastrointestinal: Negative for abdominal pain, blood in stool, constipation and diarrhea.  Endocrine: Negative for polydipsia and polyuria.  Genitourinary: Negative for dysuria, frequency and urgency.  Musculoskeletal: Positive for arthralgias and back pain. Negative for myalgias.  Skin: Negative  for pallor and rash.  Allergic/Immunologic: Negative for environmental allergies.  Neurological: Negative for dizziness, syncope, light-headedness and headaches.       Several falls  Hematological: Negative for adenopathy. Does not bruise/bleed easily.  Psychiatric/Behavioral: Negative for decreased concentration and dysphoric mood. The patient is not nervous/anxious.        Objective:   Physical Exam Constitutional:      General: She is not in acute distress.    Appearance: Normal appearance. She is well-developed and normal weight. She is not ill-appearing or diaphoretic.  HENT:     Head: Normocephalic and atraumatic.     Right Ear: Tympanic membrane, ear canal and external ear normal.     Left Ear: Tympanic membrane, ear canal and external ear normal.     Nose: Nose normal. No congestion.     Mouth/Throat:     Mouth: Mucous membranes are moist.     Pharynx: Oropharynx is clear. No posterior oropharyngeal erythema.  Eyes:     General: No scleral icterus.    Extraocular Movements: Extraocular movements intact.     Conjunctiva/sclera: Conjunctivae normal.     Pupils: Pupils are equal, round, and reactive to light.  Neck:     Thyroid: No thyromegaly.     Vascular: No carotid bruit or JVD.  Cardiovascular:     Rate and Rhythm: Normal rate and regular rhythm.     Pulses: Normal pulses.     Heart sounds: Normal heart sounds. No gallop.   Pulmonary:     Effort: Pulmonary effort is normal. No respiratory distress.     Breath sounds: Normal breath sounds. No wheezing.     Comments: Good air exch Chest:  Chest wall: No tenderness.  Abdominal:     General: Bowel sounds are normal. There is no distension or abdominal bruit.     Palpations: Abdomen is soft. There is no mass.     Tenderness: There is no abdominal tenderness.     Hernia: No hernia is present.  Genitourinary:    Comments: Breast exam: No mass, nodules, thickening, tenderness, bulging, retraction, inflamation,  nipple discharge or skin changes noted.  No axillary or clavicular LA.     Baseline surgical changes in R breast Musculoskeletal:        General: No tenderness. Normal range of motion.     Cervical back: Normal range of motion and neck supple. No rigidity. No muscular tenderness.     Right lower leg: No edema.     Left lower leg: No edema.  Lymphadenopathy:     Cervical: No cervical adenopathy.  Skin:    General: Skin is warm and dry.     Coloration: Skin is not pale.     Findings: No erythema or rash.  Neurological:     Mental Status: She is alert. Mental status is at baseline.     Cranial Nerves: No cranial nerve deficit.     Motor: No abnormal muscle tone.     Coordination: Coordination normal.     Gait: Gait normal.     Deep Tendon Reflexes: Reflexes are normal and symmetric.  Psychiatric:        Mood and Affect: Mood normal.        Cognition and Memory: Cognition and memory normal.           Assessment & Plan:   Problem List Items Addressed This Visit      Cardiovascular and Mediastinum   Complete heart block (HCC)    S/p pacing Seeing Dr Caryl Comes       Chronic heart failure with preserved ejection fraction (HFpEF) ([place])    Continues cardiology f/u  Some recent orthostatic hypotension-planning f/u         Respiratory   Chronic respiratory failure with hypoxia ([place])    Under care of pulmonary Per pt doing fairly well        Musculoskeletal and Integument   Osteoporosis    dexa 12/19 OP of forearm - she is not ready to plan next one  2 falls (planning f/u with cardiology to disc orthostatic bp change)  No fractures  Some exercise/video at home and walks when she can        Other   Encounter for Medicare annual wellness exam - Primary    Reviewed health habits including diet and exercise and skin cancer prevention Reviewed appropriate screening tests for age  Also reviewed health mt list, fam hx and immunization status , as well as social and family  history   See HPI Labs reviewed utd cancer screening (no recurrence of breast cancer) utd vaccines (had zostavax)  Declines dexa right now, some falls and no fractures Adv directive utd  No cognitive concerns      Wrinkles    Refilled retin A      Elevated glucose    Lab Results  Component Value Date   HGBA1C 5.8 03/09/2020   disc imp of low glycemic diet and wt loss to prevent DM2       Seizures ([place])    None recent  Continues keppra      Hyperlipidemia LDL goal <70    Disc goals for lipids and reasons to  control them Rev last labs with pt Rev low sat fat diet in detail Controlled with atorvastatin 40 mg daily

## 2020-03-21 NOTE — Assessment & Plan Note (Signed)
dexa 12/19 OP of forearm - she is not ready to plan next one  2 falls (planning f/u with cardiology to disc orthostatic bp change)  No fractures  Some exercise/video at home and walks when she can

## 2020-03-21 NOTE — Assessment & Plan Note (Signed)
Lab Results  Component Value Date   HGBA1C 5.8 03/09/2020   disc imp of low glycemic diet and wt loss to prevent DM2

## 2020-03-21 NOTE — Assessment & Plan Note (Signed)
Under care of pulmonary Per pt doing fairly well

## 2020-03-21 NOTE — Assessment & Plan Note (Signed)
S/p pacing Seeing Dr Caryl Comes

## 2020-03-21 NOTE — Progress Notes (Signed)
Remote pacemaker transmission.   

## 2020-03-21 NOTE — Assessment & Plan Note (Signed)
Refilled retin A

## 2020-03-21 NOTE — Assessment & Plan Note (Signed)
None recent  Continues keppra

## 2020-03-21 NOTE — Patient Instructions (Addendum)
If you are interested in the new shingles vaccine (Shingrix) - call your local pharmacy to check on coverage and availability  If affordable, get on a wait list at your pharmacy to get the vaccine.  Exercise at home with videos   Please drink more water  Aim for 64 oz fluids per day -mostly water  Discuss the orthostatic hypotension with cardiology   Stay active and engaged   Follow up with cardiology regarding the blood pressure/falls   Take care of yourself

## 2020-03-21 NOTE — Patient Instructions (Signed)
Continue Stiolto.  We will see you in follow-up in 3 months time call sooner should any new problems arise.

## 2020-03-21 NOTE — Assessment & Plan Note (Signed)
Continues cardiology f/u  Some recent orthostatic hypotension-planning f/u

## 2020-03-21 NOTE — Assessment & Plan Note (Signed)
Reviewed health habits including diet and exercise and skin cancer prevention Reviewed appropriate screening tests for age  Also reviewed health mt list, fam hx and immunization status , as well as social and family history   See HPI Labs reviewed utd cancer screening (no recurrence of breast cancer) utd vaccines (had zostavax)  Declines dexa right now, some falls and no fractures Adv directive utd  No cognitive concerns

## 2020-03-22 ENCOUNTER — Encounter: Payer: Self-pay | Admitting: Pulmonary Disease

## 2020-03-28 ENCOUNTER — Ambulatory Visit (HOSPITAL_COMMUNITY)
Admission: RE | Admit: 2020-03-28 | Discharge: 2020-03-28 | Disposition: A | Discharge status: Home / Self Care | Payer: Medicare Other | Source: Ambulatory Visit | Attending: Family Medicine | Admitting: Family Medicine

## 2020-03-28 ENCOUNTER — Telehealth: Payer: Self-pay | Admitting: Family Medicine

## 2020-03-28 ENCOUNTER — Other Ambulatory Visit: Payer: Self-pay

## 2020-03-28 DIAGNOSIS — M542 Cervicalgia: Secondary | ICD-10-CM | POA: Diagnosis not present

## 2020-03-28 DIAGNOSIS — M792 Neuralgia and neuritis, unspecified: Secondary | ICD-10-CM | POA: Insufficient documentation

## 2020-03-28 MED ORDER — GABAPENTIN 100 MG PO CAPS: 200.0000 mg | ORAL_CAPSULE | Freq: Two times a day (BID) | ORAL | 5 refills | Status: DC

## 2020-03-28 NOTE — Telephone Encounter (Signed)
If she is doing better I think we can stick to the gabapentin and monitor without orthopedics  Please verify she is taking 300 mg bid (if so I can send in the 300 mg pills instead of the 100s)

## 2020-03-28 NOTE — Progress Notes (Signed)
Patient here today for MRI Cervical spine WO contrast. Patient has boston scientific device. Heart connect with Joey Deakins-Rep. Verbal orders received from Renee-Cardiology PA for DOO 85

## 2020-03-28 NOTE — Telephone Encounter (Signed)
Pt notified of Dr. Marliss Coots comments. Pt said she is taking 2 caps (200 mg) TID  [facility] [address]

## 2020-03-28 NOTE — Telephone Encounter (Signed)
-----   Message from Tammi Sou, [place] sent at 03/28/2020  3:35 PM EST ----- Pt notified of MRI results and Dr. Marliss Coots comments. Pt said gabapentin is helping, she isn't stiff and the pain is a lot better. Pt wanted to know if she should just stay on gabapentin, if so she needs a refill, [facility] [address] pt said since meds are helping she doesn't think she needs to go to Ortho unless Dr. Glori Bickers thinks she should please advise

## 2020-04-03 ENCOUNTER — Other Ambulatory Visit: Payer: Self-pay | Admitting: Family Medicine

## 2020-04-03 ENCOUNTER — Encounter: Payer: Self-pay | Admitting: Family Medicine

## 2020-04-03 NOTE — Telephone Encounter (Signed)
Name of Medication: Ambien Name of Pharmacy: [facility] or Written Date and Quantity: 10/03/19 #30 tabs with 5 refills Last Office Visit and Type: CPE 03/21/20 Next Office Visit and Type: none scheduled:

## 2020-04-03 NOTE — Telephone Encounter (Signed)
prozac filled last on 03/21/20 #90 caps with 3 refills, ? If you want me to try and do a override to see if pharmacy will fill early?

## 2020-04-04 NOTE — Telephone Encounter (Signed)
Called pharmacy again and no answer just kept ringing

## 2020-04-04 NOTE — Telephone Encounter (Signed)
Called pharmacy twice and no answer just rang and rang then would go back to the automated sys. Will try again

## 2020-04-05 ENCOUNTER — Other Ambulatory Visit: Payer: Self-pay | Admitting: Family Medicine

## 2020-04-05 NOTE — Telephone Encounter (Signed)
Called pharmacy again and no answer just kept ringing

## 2020-04-06 NOTE — Telephone Encounter (Signed)
Could never get in touch with pharmacy so new Rx sent in with a note saying to fill it early

## 2020-04-17 ENCOUNTER — Encounter: Payer: Self-pay | Admitting: Internal Medicine

## 2020-04-17 ENCOUNTER — Other Ambulatory Visit
Admission: RE | Admit: 2020-04-17 | Discharge: 2020-04-17 | Disposition: A | Discharge status: Home / Self Care | Payer: Medicare Other | Source: Ambulatory Visit | Attending: Internal Medicine | Admitting: Internal Medicine

## 2020-04-17 ENCOUNTER — Other Ambulatory Visit: Payer: Self-pay

## 2020-04-17 ENCOUNTER — Ambulatory Visit (INDEPENDENT_AMBULATORY_CARE_PROVIDER_SITE_OTHER): Payer: Medicare Other | Admitting: Internal Medicine

## 2020-04-17 VITALS — BP 100/60 | HR 71 | Ht 68.5 in | Wt 186.0 lb

## 2020-04-17 DIAGNOSIS — Z950 Presence of cardiac pacemaker: Secondary | ICD-10-CM

## 2020-04-17 DIAGNOSIS — I442 Atrioventricular block, complete: Secondary | ICD-10-CM

## 2020-04-17 DIAGNOSIS — I951 Orthostatic hypotension: Secondary | ICD-10-CM

## 2020-04-17 LAB — CUP PACEART INCLINIC DEVICE CHECK
Brady Statistic RA Percent Paced: 5 %
Brady Statistic RV Percent Paced: 100 %
Date Time Interrogation Session: 20220222093238
Implantable Lead Implant Date: 20130830
Implantable Lead Implant Date: 20130830
Implantable Lead Location: 753859
Implantable Lead Location: 753860
Implantable Lead Model: 4456
Implantable Lead Model: 4479
Implantable Lead Serial Number: 473325
Implantable Lead Serial Number: 523784
Implantable Pulse Generator Implant Date: 20211011
Lead Channel Pacing Threshold Amplitude: 0.6 V
Lead Channel Pacing Threshold Amplitude: 0.6 V
Lead Channel Pacing Threshold Pulse Width: 0.4 ms
Lead Channel Pacing Threshold Pulse Width: 0.4 ms
Lead Channel Setting Pacing Amplitude: 2 V
Lead Channel Setting Pacing Amplitude: 2 V
Lead Channel Setting Pacing Pulse Width: 0.4 ms
Lead Channel Setting Sensing Sensitivity: 2.5 mV
Pulse Gen Serial Number: 949303

## 2020-04-17 LAB — PACEMAKER DEVICE OBSERVATION

## 2020-04-17 LAB — URINALYSIS, DIPSTICK ONLY
Bilirubin Urine: NEGATIVE
Glucose, UA: NEGATIVE mg/dL
Hgb urine dipstick: NEGATIVE
Ketones, ur: NEGATIVE mg/dL
Leukocytes,Ua: NEGATIVE
Nitrite: NEGATIVE
Protein, ur: NEGATIVE mg/dL
Specific Gravity, Urine: 1.016 (ref 1.005–1.030)
pH: 6 (ref 5.0–8.0)

## 2020-04-17 NOTE — Patient Instructions (Addendum)
Dr Caryl Comes recommends you purchase an abdominal binder and bilateral compression sleeves to help prevent your orthostatics.    Medication Instructions:  Your physician recommends that you continue on your current medications as directed. Please refer to the Current Medication list given to you today.  *If you need a refill on your cardiac medications before your next appointment, please call your pharmacy*   Lab Work: Your physician recommends that you return for lab work in: TODAY for Urine sample.  - Please go to the [facility]. You will check in at the front desk to the right as you walk into the atrium. Valet Parking is offered if needed. - No appointment needed. You may go any day between 7 am and 6 pm.  If you have labs (blood work) drawn today and your tests are completely normal, you will receive your results only by: Marland Kitchen MyChart Message (if you have MyChart) OR . A paper copy in the mail If you have any lab test that is abnormal or we need to change your treatment, we will call you to review the results.   Testing/Procedures:  Dr Caryl Comes recommends you have a Cardiac PYP scan. This is done at our [facility] location. Someone will call you to schedule and follow up with further details.  If you have any further issues with your device, you may call the [facility] at [phone].  Follow-Up: At [facility], you and your health needs are our priority.  As part of our continuing mission to provide you with exceptional heart care, we have created designated Provider Care Teams.  These Care Teams include your primary Cardiologist (physician) and Advanced Practice Providers (APPs -  Physician Assistants and Nurse Practitioners) who all work together to provide you with the care you need, when you need it.  We recommend signing up for the patient portal called "MyChart".  Sign up information is provided on this After Visit Summary.  MyChart is used to connect with  patients for Virtual Visits (Telemedicine).  Patients are able to view lab/test results, encounter notes, upcoming appointments, etc.  Non-urgent messages can be sent to your provider as well.   To learn more about what you can do with MyChart, go to [email or web address].    Your next appointment:   4 month(s)  The format for your next appointment:   In Person  Provider:   Virl Axe, MD

## 2020-04-17 NOTE — Progress Notes (Signed)
Patient Care Team: Tower, Wynelle Fanny, MD as PCP - General Deboraha Sprang, MD as Consulting Physician (Cardiology) Lorelee Cover., MD as Referring Physician (Ophthalmology) Tower, Wynelle Fanny, MD as Consulting Physician (Family Medicine) Bary Castilla Forest Gleason, MD (General Surgery)   HPI  [facility] [place] is a 70 y.o. female Seen by both Drs. Allred and Lovena Le in the past who developed complete heart block and underwent pacing fall 2013 [facility]; underwent gen change 10/21     Also with orthostasis  .The patient denies chest pain, shortness of breath, nocturnal dyspnea, orthopnea but some peripheral edema.  There have been no palpitations but no interval  syncope .  Not able to exercise 2/2 back pain; gaining weigh  Eval for orthostasis and CHB  >>   SPEP normal; cortisol normal     DATE TEST EF   9/13 LHC  No Obstr CAD ( by report)   9/13 Echo   55-65 %   12/14 Echo   55-65 %   8/18 Echo  55-60%   7/19 Echo  50-55%   10/20 CTA  CaScore 521 ? Severe stenosis  5/21 Echo  55-60%   5/21 LHC  LADm-30: OM2p-20      Her daughter  developed cancer;  died 03-20-2022; devastating.  She meets with her grandchildren regularly for breakfast. GD off to college in the fall, GS now 13   Date Cr K  8//18 0.83 4.4   10/19 0.95 4.5  1/22 1.06 5.1     Past Medical History:  Diagnosis Date  . Allergic rhinitis   . Arthritis   . Asthma    as a child, mild now  . Breast cancer ([place]) 12/2015   right breast cancer, lumpectomy and mammosite   . Colon polyps    colonoscopy 7/08, tubular adenoma  . Complete heart block ([place]) 8/13   s/p PPM implanted in [facility]  . COPD (chronic obstructive pulmonary disease) ([place])   . Myocardial infarction ([place]) 2011  . Osteopenia 10/2015  . Pacemaker    2011  . Personal history of radiation therapy 2017   right breast ca, mammosite placed  . Seizure disorder ([place])   . Seizures ([address])    first one was when she was 70 years old   . Small  bowel obstruction ([place])    1988 and 2002  . Tobacco abuse     Past Surgical History:  Procedure Laterality Date  . ABDOMINAL HYSTERECTOMY    . BREAST BIOPSY Right 2007   benign inflammatory changes, mass due to underwire bra  . BREAST BIOPSY Left 01/02/2016   columnar cell changes without atypical hyperplasia.  Marland Kitchen BREAST BIOPSY Right 12/06/2015   rt breast mass 10:00, bx done at Dr. Curly Shores office, invasive ductal carcinoma  . BREAST EXCISIONAL BIOPSY Left 01/02/2016   COLUMNAR CELL CHANGE AND HYPERPLASIA ASSOCIATED WITH LUMINAL AND STROMAL CALCIFICATIONS  . BREAST LUMPECTOMY Right 01/02/2016   invasive mammary carcinoma, clear margins, negative LN  . BREAST LUMPECTOMY WITH SENTINEL LYMPH NODE BIOPSY Right 01/02/2016   pT1c, N0; ER/ PR 100%; Her 2 neu not over expressed: BREAST LUMPECTOMY WITH SENTINEL LYMPH NODE BX;  Surgeon: [name], MD;  Location: [facility] ORS;  Service: General;  Laterality: Right;  . CARDIAC CATHETERIZATION    . CATARACT EXTRACTION Bilateral   . COLONOSCOPY  10/2015   Dr Ardis Hughs  . COLONOSCOPY WITH PROPOFOL N/A 08/19/2018   Procedure: COLONOSCOPY WITH PROPOFOL;  Surgeon: Virgel Manifold, MD;  Location: [facility] ENDOSCOPY;  Service: Endoscopy;  Laterality: N/A;  . ESOPHAGOGASTRODUODENOSCOPY (EGD) WITH PROPOFOL N/A 08/19/2018   Procedure: ESOPHAGOGASTRODUODENOSCOPY (EGD) WITH PROPOFOL;  Surgeon: Virgel Manifold, MD;  Location: [facility] ENDOSCOPY;  Service: Endoscopy;  Laterality: N/A;  . EXPLORATORY LAPAROTOMY  01/25/2001   Exploratory laparotomy, lysis of adhesions, identification of internal hernia secondary to omental adhesion. Prolonged postoperative ileus.  Marland Kitchen gyn surgery  1993   hysterectomy- form endometriosis  . LAPAROSCOPY    . PACEMAKER INSERTION  10/24/11   [employer] Advantio dual chamber PPM implanted by Dr Bunnie Philips at [facility] in [place]  . PPM GENERATOR CHANGEOUT N/A 12/05/2019   Procedure: PPM GENERATOR CHANGEOUT;   Surgeon: Deboraha Sprang, MD;  Location: [place] CV LAB;  Service: Cardiovascular;  Laterality: N/A;  . RIGHT/LEFT HEART CATH AND CORONARY ANGIOGRAPHY Bilateral 07/19/2019   Procedure: RIGHT/LEFT HEART CATH AND CORONARY ANGIOGRAPHY;  Surgeon: Nelva Bush, MD;  Location: [place] CV LAB;  Service: Cardiovascular;  Laterality: Bilateral;  . TEMPORARY PACEMAKER N/A 12/05/2019   Procedure: TEMPORARY PACEMAKER;  Surgeon: Deboraha Sprang, MD;  Location: [place] CV LAB;  Service: Cardiovascular;  Laterality: N/A;    Current Outpatient Medications  Medication Sig Dispense Refill  . acetaminophen (TYLENOL) 500 MG tablet Take 500-1,000 mg by mouth every 6 (six) hours as needed for mild pain or headache.     . albuterol (VENTOLIN HFA) 108 (90 Base) MCG/ACT inhaler Inhale 2 puffs into the lungs every 4 (four) hours as needed for wheezing or shortness of breath. 18 g 1  . aspirin EC 81 MG tablet Take 81 mg by mouth daily. Swallow whole.    Marland Kitchen atorvastatin (LIPITOR) 40 MG tablet Take 1 tablet (40 mg total) by mouth daily. 90 tablet 1  . Calcium Carb-Cholecalciferol (CALCIUM 600 + D PO) Take 1 tablet by mouth 2 (two) times daily.    Marland Kitchen FLUoxetine (PROZAC) 20 MG capsule Take 1 capsule by mouth once daily 90 capsule 0  . furosemide (LASIX) 40 MG tablet Take 1 tablet (40 mg total) by mouth daily as needed for edema (For weight gain or swelling.). 90 tablet 3  . gabapentin (NEURONTIN) 100 MG capsule Take 2 capsules (200 mg total) by mouth 2 (two) times daily. 120 capsule 5  . levETIRAcetam (KEPPRA) 250 MG tablet Take 250 mg by mouth 2 (two) times daily.     . OXYGEN Inhale 3 L into the lungs at bedtime.     . Tiotropium Bromide-Olodaterol (STIOLTO RESPIMAT) 2.5-2.5 MCG/ACT AERS Inhale 2 puffs into the lungs daily. 4 g 0  . tretinoin (RETIN-A) 0.1 % cream Apply topically at bedtime. 45 g 1  . zolpidem (AMBIEN) 10 MG tablet TAKE 1/2 TO 1 (ONE-HALF TO ONE) TABLET BY MOUTH AT BEDTIME AS NEEDED FOR  SLEEP 30 tablet 5   No current facility-administered medications for this visit.    Allergies  Allergen Reactions  . Codeine Nausea And Vomiting  . Morphine And Related Nausea Only    Review of Systems negative except from HPI and PMH  Physical Exam BP 100/60 (BP Location: Left Arm, Patient Position: Sitting, Cuff Size: Normal)   Pulse 71   Ht 5' 8.5" (1.74 m)   Wt 186 lb (84.4 kg)   SpO2 92%   BMI 27.87 kg/m  Well developed and well nourished in no acute distress HENT normal Neck supple with JVP-flat Clear Device pocket well healed; without hematoma or  erythema.  There is no tethering  Regular rate and rhythm, no murmur Abd-soft with active BS No Clubbing cyanosis   edema Skin-warm and dry A & Oriented  Grossly normal sensory and motor function  ECG P-synchronous/ AV  pacing       Assessment and  Plan  High-grade heart block  Pacemaker-[employer]    Sinus node dysfunction and chronotropic incompetence   Exercise intolerance  Orthostatic hypotension or presyncope    Device function normal, some thumping so will reprogram to fixed output  The sensation is in the pectoral area which raised the concern of an energy leak proximally, ie insulation breach. No measurements to support but have asked her to observe  We discussed the physiology of orthostatic intolerance including gravitational fluid shifts and the impact of hypertensive vascular disease on orthostasis and treatment options.  We discussed pharmacological options including midodrine, fludrocortisone.  We discussed nonpharmacological options including raising the HOB, isometric contraction upon standing, abdominal binders  thigh sleeves. We emphasized the importance of recognizing the prodrome and sitting prior to falling, safety in the shower and in the bathroom and the avoidance of dehydration   We will begin with a nonpharmacological approach with abdominal binder thigh sleeves and showering in  the evening  We discussed exercise with her back and suggested bike or pool  With orthostasis and heart block, reassuring that SPEP was normal, will get UPEP an PYP scan  . No evidence of pacemaker cardiomyopathy

## 2020-04-18 ENCOUNTER — Telehealth (HOSPITAL_COMMUNITY): Payer: Self-pay

## 2020-04-18 NOTE — Telephone Encounter (Signed)
Spoke with the patient, asked her to be 15 minutes early. She stated that she would. S.Winston Misner EMTP

## 2020-04-19 ENCOUNTER — Ambulatory Visit (HOSPITAL_COMMUNITY): Payer: Medicare Other | Attending: Cardiology

## 2020-04-19 ENCOUNTER — Other Ambulatory Visit: Payer: Self-pay

## 2020-04-19 DIAGNOSIS — I442 Atrioventricular block, complete: Secondary | ICD-10-CM | POA: Insufficient documentation

## 2020-04-19 DIAGNOSIS — I951 Orthostatic hypotension: Secondary | ICD-10-CM

## 2020-04-19 LAB — PROTEIN ELECTRO, RANDOM URINE
Albumin ELP, Urine: 100 %
Alpha-1-Globulin, U: 0 %
Alpha-2-Globulin, U: 0 %
Beta Globulin, U: 0 %
Gamma Globulin, U: 0 %
Total Protein, Urine: 7.1 mg/dL

## 2020-04-19 MED ORDER — TECHNETIUM TC 99M PYROPHOSPHATE
20.2000 | Freq: Once | INTRAVENOUS | Status: AC
  Administered 2020-04-19: 20.2 via INTRAVENOUS

## 2020-04-20 NOTE — Telephone Encounter (Signed)
Per Bethanne Ginger at [facility] they do have the ONO order but they also had her contact # as [phone].  Bethanne Ginger stated she would get this over to RT to call the patient

## 2020-04-20 NOTE — Telephone Encounter (Signed)
Patient has not been contacted by [facility]. ONO was ordered on 03/20/2020  Diana Cook, please advise.

## 2020-04-24 ENCOUNTER — Telehealth: Payer: Self-pay | Admitting: *Deleted

## 2020-04-24 DIAGNOSIS — R918 Other nonspecific abnormal finding of lung field: Secondary | ICD-10-CM

## 2020-04-24 DIAGNOSIS — Z87891 Personal history of nicotine dependence: Secondary | ICD-10-CM

## 2020-04-24 NOTE — Telephone Encounter (Signed)
Patient scheduled for 3 month follow up 05/24/2020 9:30 AM.

## 2020-04-25 ENCOUNTER — Encounter: Payer: Self-pay | Admitting: Radiation Oncology

## 2020-04-25 NOTE — Telephone Encounter (Signed)
Contacted and scheduled for LCS nodule follow up scan. Patient is a former smoker, quit 11/11/10, 42 pack year history.

## 2020-04-25 NOTE — Addendum Note (Signed)
Addended by: Lieutenant Diego on: 04/25/2020 08:47 AM   Modules accepted: Orders

## 2020-04-26 ENCOUNTER — Ambulatory Visit
Admission: RE | Admit: 2020-04-26 | Discharge: 2020-04-26 | Disposition: A | Discharge status: Home / Self Care | Payer: Medicare Other | Source: Ambulatory Visit | Attending: Radiation Oncology | Admitting: Radiation Oncology

## 2020-04-26 ENCOUNTER — Encounter: Payer: Self-pay | Admitting: Radiation Oncology

## 2020-04-26 ENCOUNTER — Telehealth: Payer: Self-pay | Admitting: Pulmonary Disease

## 2020-04-26 VITALS — BP 127/66 | HR 75 | Temp 97.5°F | Wt 185.0 lb

## 2020-04-26 DIAGNOSIS — Z08 Encounter for follow-up examination after completed treatment for malignant neoplasm: Secondary | ICD-10-CM | POA: Diagnosis not present

## 2020-04-26 DIAGNOSIS — C50411 Malignant neoplasm of upper-outer quadrant of right female breast: Secondary | ICD-10-CM

## 2020-04-26 DIAGNOSIS — Z170 Estrogen receptor positive status [ER+]: Secondary | ICD-10-CM

## 2020-04-26 DIAGNOSIS — Z853 Personal history of malignant neoplasm of breast: Secondary | ICD-10-CM | POA: Diagnosis not present

## 2020-04-26 NOTE — Progress Notes (Signed)
Radiation Oncology Follow up Note  Name: Diana Cook   Date:   04/26/2020 MRN:  [id number] DOB: Jul 18, 1950    This 70 y.o. female presents to the clinic today for over 4-year follow-up status post whole breast radiation to her right breast for ER/PR positive invasive mammary carcinoma.  REFERRING PROVIDER: Tower, Wynelle Fanny, MD  HPI: Patient is a 70 year old female now out over 4 years having completed whole breast radiation to her right breast for ER/PR positive invasive mammary carcinoma seen today in routine follow-up she is doing well.  She specifically denies breast tenderness cough or bone pain..  She had mammograms back in November which were BI-RADS 2 benign which I have reviewed.  She has had a chest CT showing an 8 mm lesion in the periphery of the left lower lobe which warrants follow-up attention.  She has had a CT scan of the head showing mild patchy periventricular small vessel disease with no acute infarct demonstratable and she was having some left-sided numbness.  She also an MRI of her cervical spine showing multiple level degenerative changes.  No evidence of metastatic disease.  She is not on antiestrogen therapy.  COMPLICATIONS OF TREATMENT: none  FOLLOW UP COMPLIANCE: keeps appointments   PHYSICAL EXAM:  BP 127/66   Pulse 75   Temp (!) 97.5 F (36.4 C) (Tympanic)   Wt 185 lb (83.9 kg)   BMI 27.72 kg/m  Lungs are clear to A&P cardiac examination essentially unremarkable with regular rate and rhythm. No dominant mass or nodularity is noted in either breast in 2 positions examined. Incision is well-healed. No axillary or supraclavicular adenopathy is appreciated. Cosmetic result is excellent.  Well-developed well-nourished patient in NAD. HEENT reveals PERLA, EOMI, discs not visualized.  Oral cavity is clear. No oral mucosal lesions are identified. Neck is clear without evidence of cervical or supraclavicular adenopathy. Lungs are clear to A&P. Cardiac examination is  essentially unremarkable with regular rate and rhythm without murmur rub or thrill. Abdomen is benign with no organomegaly or masses noted. Motor sensory and DTR levels are equal and symmetric in the upper and lower extremities. Cranial nerves II through XII are grossly intact. Proprioception is intact. No peripheral adenopathy or edema is identified. No motor or sensory levels are noted. Crude visual fields are within normal range.  RADIOLOGY RESULTS: CT scans MRI of cervical spine mammograms all reviewed compatible with above-stated findings  PLAN: Present time patient is now out over 4 years with no evidence of disease.  I am pleased with her overall progress.  I have again to turn follow-up care over to her other providers.  I would be happy to reevaluate her anytime should further consultation be indicated.  Patient knows to call with any concerns at any time.  I would like to take this opportunity to thank you for allowing me to participate in the care of your patient.Noreene Filbert, MD

## 2020-04-26 NOTE — Telephone Encounter (Signed)
ONO verbally by Dr. Patsey Berthold- recommend formal in lab sleep study as her O2 sats continue to not hold up with O2.   Patient is aware of results and voiced her understanding.  Patient would like to declined sleep study at this time. She had a sleep study years ago and it was positive for OSA . She could not tolerate cpap.  She is questioning if she should increase liter flow?  Dr. Patsey Berthold, please advise. Thanks

## 2020-04-27 ENCOUNTER — Telehealth: Payer: Self-pay | Admitting: *Deleted

## 2020-04-27 MED ORDER — ZOLPIDEM TARTRATE 10 MG PO TABS
ORAL_TABLET | ORAL | 0 refills | Status: DC
Start: 2020-04-27 — End: 2020-10-22

## 2020-04-27 NOTE — Telephone Encounter (Signed)
Patient called stating that she is due a refill on her Zolpidem Monday. Patient stated that she is leaving to go on vacation Monday morning at 6:00 am and the pharmacy will not be open at that time. Patient stated that the pharmacist told her she could call Dr. Marliss Coots office and see if she would authorize an early refill. Patient is requesting that Dr. Glori Bickers call [facility] and authorize her getting the refill early.

## 2020-04-27 NOTE — Telephone Encounter (Signed)
Agree with as noted below.

## 2020-05-14 ENCOUNTER — Other Ambulatory Visit: Payer: Self-pay | Admitting: Internal Medicine

## 2020-05-16 ENCOUNTER — Ambulatory Visit (INDEPENDENT_AMBULATORY_CARE_PROVIDER_SITE_OTHER): Payer: Medicare Other | Admitting: Internal Medicine

## 2020-05-16 ENCOUNTER — Other Ambulatory Visit: Payer: Self-pay

## 2020-05-16 ENCOUNTER — Encounter: Payer: Self-pay | Admitting: Internal Medicine

## 2020-05-16 VITALS — BP 120/60 | HR 78 | Ht 68.5 in | Wt 187.0 lb

## 2020-05-16 DIAGNOSIS — E785 Hyperlipidemia, unspecified: Secondary | ICD-10-CM

## 2020-05-16 DIAGNOSIS — I442 Atrioventricular block, complete: Secondary | ICD-10-CM | POA: Diagnosis not present

## 2020-05-16 DIAGNOSIS — I2510 Atherosclerotic heart disease of native coronary artery without angina pectoris: Secondary | ICD-10-CM | POA: Diagnosis not present

## 2020-05-16 DIAGNOSIS — R42 Dizziness and giddiness: Secondary | ICD-10-CM | POA: Diagnosis not present

## 2020-05-16 DIAGNOSIS — R0602 Shortness of breath: Secondary | ICD-10-CM | POA: Diagnosis not present

## 2020-05-16 NOTE — Progress Notes (Signed)
Follow-up Outpatient Visit Date: 05/16/2020  Primary Care Provider: Abner Greenspan, MD [place] [address]  Chief Complaint: Follow-up CAD and shortness of breath  HPI:  Diana Cook is a 70 y.o. female with history of moderate coronary artery disease by cardiac CTA in 11/2018 and catheterization in 06/2019, complete heart block status post permanent pacemaker followed by Dr. Caryl Comes, seizure disorder, COPD, and right breast cancer, who presents for follow-up of shortness of breath.  I last saw her in 10/2019, at which time she was doing fairly well though she reported 2-3 episodes of chest pain at rest, which improved with acetaminophen.  Stable exertional dyspnea was unchanged.  Catheterization 06/2019 had shown mild to moderate nonobstructive CAD with mildly elevated left and right heart filling pressures.  PYP scan to exclude amyloidosis by Dr. Caryl Comes last month was normal.  She also underwent pacemaker generator change in October with Dr. Caryl Comes.  Today, Diana Cook reports that she is feeling relatively well.  She is most concerned about her teeth, which have been causing issues.  She is concerned that she may need further dental implants.  She has not had any chest pain.  Her chronic exertional dyspnea is stable.  Due to nighttime desaturations noted at home, Diana Cook was advised to undergo sleep study in the sleep lab.  However, she does not wish to move forward with this as she has been unable to tolerate CPAP in the past.  She will continue to follow-up with Dr. Patsey Berthold.  Diana Cook denies palpitations.  She has mild lightheadedness at times but is able to control this by drinking plenty of water and standing up slowly.  She has not had any recent falls or syncope.  She is using an abdominal binder at times.  She notes occasional mild swelling in her feet and ankles, for which she is using her as needed furosemide on a daily basis.  She is not exercising regularly but is looking  into joining a gym.  --------------------------------------------------------------------------------------------------  Past Medical History:  Diagnosis Date  . Allergic rhinitis   . Arthritis   . Asthma    as a child, mild now  . Breast cancer ([place]) 12/2015   right breast cancer, lumpectomy and mammosite   . Colon polyps    colonoscopy 7/08, tubular adenoma  . Complete heart block ([place]) 8/13   s/p PPM implanted in [facility]  . COPD (chronic obstructive pulmonary disease) ([place])   . Myocardial infarction ([place]) 2011  . Osteopenia 10/2015  . Pacemaker    2011  . Personal history of radiation therapy 2017   right breast ca, mammosite placed  . Seizure disorder ([place])   . Seizures ([place])    first one was when she was 70 years old   . Small bowel obstruction ([place])    1988 and 2002  . Tobacco abuse    Past Surgical History:  Procedure Laterality Date  . ABDOMINAL HYSTERECTOMY    . BREAST BIOPSY Right 2007   benign inflammatory changes, mass due to underwire bra  . BREAST BIOPSY Left 01/02/2016   columnar cell changes without atypical hyperplasia.  Marland Kitchen BREAST BIOPSY Right 12/06/2015   rt breast mass 10:00, bx done at Dr. Curly Shores office, invasive ductal carcinoma  . BREAST EXCISIONAL BIOPSY Left 01/02/2016   COLUMNAR CELL CHANGE AND HYPERPLASIA ASSOCIATED WITH LUMINAL AND STROMAL CALCIFICATIONS  . BREAST LUMPECTOMY Right 01/02/2016   invasive mammary carcinoma, clear margins, negative LN  .  BREAST LUMPECTOMY WITH SENTINEL LYMPH NODE BIOPSY Right 01/02/2016   pT1c, N0; ER/ PR 100%; Her 2 neu not over expressed: BREAST LUMPECTOMY WITH SENTINEL LYMPH NODE BX;  Surgeon: [name], MD;  Location: [facility] ORS;  Service: General;  Laterality: Right;  . CARDIAC CATHETERIZATION    . CATARACT EXTRACTION Bilateral   . COLONOSCOPY  10/2015   Dr Ardis Hughs  . COLONOSCOPY WITH PROPOFOL N/A 08/19/2018   Procedure: COLONOSCOPY WITH PROPOFOL;  Surgeon: Virgel Manifold, MD;  Location:  [facility] ENDOSCOPY;  Service: Endoscopy;  Laterality: N/A;  . ESOPHAGOGASTRODUODENOSCOPY (EGD) WITH PROPOFOL N/A 08/19/2018   Procedure: ESOPHAGOGASTRODUODENOSCOPY (EGD) WITH PROPOFOL;  Surgeon: Virgel Manifold, MD;  Location: [facility] ENDOSCOPY;  Service: Endoscopy;  Laterality: N/A;  . EXPLORATORY LAPAROTOMY  01/25/2001   Exploratory laparotomy, lysis of adhesions, identification of internal hernia secondary to omental adhesion. Prolonged postoperative ileus.  Marland Kitchen gyn surgery  1993   hysterectomy- form endometriosis  . LAPAROSCOPY    . PACEMAKER INSERTION  10/24/11   [employer] Advantio dual chamber PPM implanted by Dr Bunnie Philips at [facility] in [place]  . PPM GENERATOR CHANGEOUT N/A 12/05/2019   Procedure: PPM GENERATOR CHANGEOUT;  Surgeon: Deboraha Sprang, MD;  Location: [place] CV LAB;  Service: Cardiovascular;  Laterality: N/A;  . RIGHT/LEFT HEART CATH AND CORONARY ANGIOGRAPHY Bilateral 07/19/2019   Procedure: RIGHT/LEFT HEART CATH AND CORONARY ANGIOGRAPHY;  Surgeon: Nelva Bush, MD;  Location: [place] CV LAB;  Service: Cardiovascular;  Laterality: Bilateral;  . TEMPORARY PACEMAKER N/A 12/05/2019   Procedure: TEMPORARY PACEMAKER;  Surgeon: Deboraha Sprang, MD;  Location: [place] CV LAB;  Service: Cardiovascular;  Laterality: N/A;    Current Meds  Medication Sig  . acetaminophen (TYLENOL) 500 MG tablet Take 500-1,000 mg by mouth every 6 (six) hours as needed for mild pain or headache.   . albuterol (VENTOLIN HFA) 108 (90 Base) MCG/ACT inhaler Inhale 2 puffs into the lungs every 4 (four) hours as needed for wheezing or shortness of breath.  Marland Kitchen aspirin EC 81 MG tablet Take 81 mg by mouth daily. Swallow whole.  Marland Kitchen atorvastatin (LIPITOR) 40 MG tablet Take 1 tablet by mouth once daily  . Calcium Carb-Cholecalciferol (CALCIUM 600 + D PO) Take 1 tablet by mouth 2 (two) times daily.  Marland Kitchen FLUoxetine (PROZAC) 20 MG capsule Take 1 capsule by mouth once daily  .  furosemide (LASIX) 40 MG tablet Take 1 tablet (40 mg total) by mouth daily as needed for edema (For weight gain or swelling.).  Marland Kitchen gabapentin (NEURONTIN) 100 MG capsule Take 2 capsules (200 mg total) by mouth 2 (two) times daily.  Marland Kitchen levETIRAcetam (KEPPRA) 250 MG tablet Take 250 mg by mouth 2 (two) times daily.   . OXYGEN Inhale 3 L into the lungs at bedtime.   . Tiotropium Bromide-Olodaterol (STIOLTO RESPIMAT) 2.5-2.5 MCG/ACT AERS Inhale 2 puffs into the lungs daily.  Marland Kitchen tretinoin (RETIN-A) 0.1 % cream Apply topically at bedtime.  Marland Kitchen zolpidem (AMBIEN) 10 MG tablet TAKE 1/2 TO 1 (ONE-HALF TO ONE) TABLET BY MOUTH AT BEDTIME AS NEEDED FOR SLEEP    Allergies: Codeine and Morphine and related  Social History   Tobacco Use  . Smoking status: Former Smoker    Packs/day: 1.00    Years: 42.00    Pack years: 42.00    Types: Cigarettes    Quit date: 11/11/2010    Years since quitting: 9.5  . Smokeless tobacco: Never Used  Vaping Use  .  Vaping Use: Never used  Substance Use Topics  . Alcohol use: Yes    Alcohol/week: 4.0 standard drinks    Types: 4 Glasses of wine per week    Comment: weekly  . Drug use: No    Family History  Problem Relation Age of Onset  . Stroke Mother   . Heart disease Mother 45       MI and CABG  . Dementia Mother   . Coronary artery disease Father   . Parkinsonism Father   . Cancer - Cervical Daughter 60       died 03/23/22  . Heart attack Brother 76  . Colon cancer Neg Hx   . Breast cancer Neg Hx     Review of Systems: A 12-system review of systems was performed and was negative except as noted in the HPI.  --------------------------------------------------------------------------------------------------  Physical Exam: BP 120/60 (BP Location: Left Arm, Patient Position: Sitting, Cuff Size: Normal)   Pulse 78   Ht 5' 8.5" (1.74 m)   Wt 187 lb (84.8 kg)   SpO2 94%   BMI 28.02 kg/m   General:  NAD. Neck: No JVD or HJR. Lungs: Clear to auscultation  bilaterally without wheezes or crackles. Heart: Regular rate and rhythm without murmurs, rubs, or gallops. Abdomen: Soft, nontender, nondistended. Extremities: No lower extremity edema.   Lab Results  Component Value Date   WBC 9.1 03/09/2020   HGB 13.8 03/09/2020   HCT 40.4 03/09/2020   MCV 88.6 03/09/2020   PLT 318.0 03/09/2020    Lab Results  Component Value Date   NA 134 (L) 03/09/2020   K 5.1 03/09/2020   CL 99 03/09/2020   CO2 28 03/09/2020   BUN 32 (H) 03/09/2020   CREATININE 1.06 03/09/2020   GLUCOSE 95 03/09/2020   ALT 18 03/09/2020    Lab Results  Component Value Date   CHOL 161 03/09/2020   HDL 65.90 03/09/2020   LDLCALC 72 03/09/2020   TRIG 120.0 03/09/2020   CHOLHDL 2 03/09/2020    --------------------------------------------------------------------------------------------------  ASSESSMENT AND PLAN: Coronary artery disease: Mild to moderate CAD noted by CTA and catheterization as recently as 06/2019.  No new symptoms are reported today.  We will continue aspirin and statin therapy to prevent progression of disease.  Shortness of breath: Longstanding and most likely related to underlying lung disease.  Continue follow-up with pulmonary.  Orthostatic lightheadedness: Well-controlled with hydration and abdominal binder.  I encouraged Diana Cook to consider trying spanks as well for support of her venous return.  I also encouraged her to use furosemide only as needed for weight gain/swelling.  Complete heart block: Patient status post generator change by Dr. Caryl Comes last fall.  Continue ongoing follow-up with Dr. Caryl Comes and the device clinic.  Hyperlipidemia: LDL borderline at 72 on last check in 02/2020.  Continue atorvastatin 40 mg daily and work on lifestyle modifications including regular exercise.  Follow-up: Return to clinic in 9 months.  Nelva Bush, MD 05/16/2020 1:18 PM

## 2020-05-16 NOTE — Patient Instructions (Signed)
Medication Instructions:  Your physician recommends that you continue on your current medications as directed. Please refer to the Current Medication list given to you today.  You may take your furosemide daily on an AS NEEDED BASIS.  *If you need a refill on your cardiac medications before your next appointment, please call your pharmacy*  Follow-Up: At [facility], you and your health needs are our priority.  As part of our continuing mission to provide you with exceptional heart care, we have created designated Provider Care Teams.  These Care Teams include your primary Cardiologist (physician) and Advanced Practice Providers (APPs -  Physician Assistants and Nurse Practitioners) who all work together to provide you with the care you need, when you need it.  We recommend signing up for the patient portal called "MyChart".  Sign up information is provided on this After Visit Summary.  MyChart is used to connect with patients for Virtual Visits (Telemedicine).  Patients are able to view lab/test results, encounter notes, upcoming appointments, etc.  Non-urgent messages can be sent to your provider as well.   To learn more about what you can do with MyChart, go to [email or web address].    Your next appointment:   9 month(s)  The format for your next appointment:   In Person  Provider:   You may see DR Harrell Gave END or one of the following Advanced Practice Providers on your designated Care Team:    Murray Hodgkins, NP  Christell Faith, PA-C  Marrianne Mood, PA-C  Cadence Portland, [place]  Laurann Montana, NP

## 2020-05-24 ENCOUNTER — Ambulatory Visit
Admission: RE | Admit: 2020-05-24 | Discharge: 2020-05-24 | Disposition: A | Discharge status: Home / Self Care | Payer: Medicare Other | Source: Ambulatory Visit | Attending: Nurse Practitioner | Admitting: Nurse Practitioner

## 2020-05-24 ENCOUNTER — Other Ambulatory Visit: Payer: Self-pay

## 2020-05-24 DIAGNOSIS — I2510 Atherosclerotic heart disease of native coronary artery without angina pectoris: Secondary | ICD-10-CM | POA: Diagnosis not present

## 2020-05-24 DIAGNOSIS — J432 Centrilobular emphysema: Secondary | ICD-10-CM | POA: Diagnosis not present

## 2020-05-24 DIAGNOSIS — Z87891 Personal history of nicotine dependence: Secondary | ICD-10-CM | POA: Diagnosis not present

## 2020-05-24 DIAGNOSIS — R918 Other nonspecific abnormal finding of lung field: Secondary | ICD-10-CM | POA: Insufficient documentation

## 2020-05-24 DIAGNOSIS — J929 Pleural plaque without asbestos: Secondary | ICD-10-CM | POA: Diagnosis not present

## 2020-05-24 DIAGNOSIS — I708 Atherosclerosis of other arteries: Secondary | ICD-10-CM | POA: Diagnosis not present

## 2020-05-25 ENCOUNTER — Telehealth: Payer: Self-pay | Admitting: *Deleted

## 2020-05-25 DIAGNOSIS — R911 Solitary pulmonary nodule: Secondary | ICD-10-CM

## 2020-05-25 NOTE — Telephone Encounter (Signed)
Pt made aware of LDCT results and informed that will need PET scan. Pt stated that she will access her MyChart to review the appt. Pt will be notified by phone with results and next steps after PET. Pt verbalized understanding.

## 2020-05-25 NOTE — Telephone Encounter (Signed)
See result note. Dr. Patsey Berthold recommends proceeding with PET scan. Order placed and will get pt scheduled.

## 2020-05-25 NOTE — Telephone Encounter (Signed)
Called Report  IMPRESSION: 1. Lung-RADS 4BS, suspicious. Additional imaging evaluation or consultation with Pulmonology or Thoracic Surgery recommended. 2. The "S" modifier above refers to potentially clinically significant non lung cancer related findings. Specifically, there is aortic atherosclerosis, in addition to left main and 2 vessel coronary artery disease. Please note that although the presence of coronary artery calcium documents the presence of coronary artery disease, the severity of this disease and any potential stenosis cannot be assessed on this non-gated CT examination. Assessment for potential risk factor modification, dietary therapy or pharmacologic therapy may be warranted, if clinically indicated. 3. Mild diffuse bronchial wall thickening with mild to moderate centrilobular and paraseptal emphysema; imaging findings suggestive of underlying COPD.  These results will be called to the ordering clinician or representative by the Radiologist Assistant, and communication documented in the PACS or [employer].  Aortic Atherosclerosis (ICD10-I70.0) and Emphysema (ICD10-J43.9).   Electronically Signed   By: Vinnie Langton M.D.   On: 05/25/2020 06:52

## 2020-06-02 ENCOUNTER — Other Ambulatory Visit: Payer: Self-pay | Admitting: Pulmonary Disease

## 2020-06-05 ENCOUNTER — Encounter
Admission: RE | Admit: 2020-06-05 | Discharge: 2020-06-05 | Disposition: A | Discharge status: Home / Self Care | Payer: Medicare Other | Source: Ambulatory Visit | Attending: Nurse Practitioner | Admitting: Nurse Practitioner

## 2020-06-05 DIAGNOSIS — Z90710 Acquired absence of both cervix and uterus: Secondary | ICD-10-CM | POA: Insufficient documentation

## 2020-06-05 DIAGNOSIS — R911 Solitary pulmonary nodule: Secondary | ICD-10-CM | POA: Insufficient documentation

## 2020-06-05 DIAGNOSIS — I700 Atherosclerosis of aorta: Secondary | ICD-10-CM | POA: Diagnosis not present

## 2020-06-05 LAB — GLUCOSE, CAPILLARY: Glucose-Capillary: 92 mg/dL (ref 70–99)

## 2020-06-05 MED ORDER — FLUDEOXYGLUCOSE F - 18 (FDG) INJECTION
9.7000 | Freq: Once | INTRAVENOUS | Status: AC | PRN
  Administered 2020-06-05: 10.28 via INTRAVENOUS

## 2020-06-07 ENCOUNTER — Ambulatory Visit (INDEPENDENT_AMBULATORY_CARE_PROVIDER_SITE_OTHER): Payer: Medicare Other

## 2020-06-07 ENCOUNTER — Telehealth: Payer: Self-pay | Admitting: *Deleted

## 2020-06-07 DIAGNOSIS — I442 Atrioventricular block, complete: Secondary | ICD-10-CM

## 2020-06-07 NOTE — Telephone Encounter (Signed)
After obtaining pulmonary opinion and medical oncology opinion, patient notified of PET scan results and recommendation for 6 month follow up scan. Patient is in agreement with this plan. She will be contacted closer to the time the scan is due for scheduling.   IMPRESSION: 1. The slowly growing left lower lobe pulmonary nodule demonstrates no hypermetabolic activity, although is borderline in size for evaluation by PET-CT. Management options include tissue sampling and continued CT follow-up in approximately 6 months. 2. No hypermetabolic adenopathy or other evidence of metastatic disease. 3. Nonspecific activity posterior to the bladder associated with the vaginal cuff post hysterectomy. 4. Coronary and Aortic Atherosclerosis (ICD10-I70.0).

## 2020-06-08 LAB — CUP PACEART REMOTE DEVICE CHECK
Battery Remaining Longevity: 162 mo
Battery Remaining Percentage: 100 %
Brady Statistic RA Percent Paced: 6 %
Brady Statistic RV Percent Paced: 100 %
Date Time Interrogation Session: 20220414011100
Implantable Lead Implant Date: 20130830
Implantable Lead Implant Date: 20130830
Implantable Lead Location: 753859
Implantable Lead Location: 753860
Implantable Lead Model: 4456
Implantable Lead Model: 4479
Implantable Lead Serial Number: 473325
Implantable Lead Serial Number: 523784
Implantable Pulse Generator Implant Date: 20211011
Lead Channel Impedance Value: 406 Ohm
Lead Channel Impedance Value: 605 Ohm
Lead Channel Pacing Threshold Amplitude: 0.4 V
Lead Channel Pacing Threshold Pulse Width: 0.4 ms
Lead Channel Setting Pacing Amplitude: 2 V
Lead Channel Setting Pacing Amplitude: 2 V
Lead Channel Setting Pacing Pulse Width: 0.4 ms
Lead Channel Setting Sensing Sensitivity: 2.5 mV
Pulse Gen Serial Number: 949303

## 2020-06-22 NOTE — Progress Notes (Signed)
Remote pacemaker transmission.   

## 2020-07-06 DIAGNOSIS — Z23 Encounter for immunization: Secondary | ICD-10-CM | POA: Diagnosis not present

## 2020-07-17 ENCOUNTER — Other Ambulatory Visit: Payer: Self-pay | Admitting: Internal Medicine

## 2020-07-25 ENCOUNTER — Other Ambulatory Visit: Payer: Self-pay | Admitting: Pulmonary Disease

## 2020-07-31 NOTE — Progress Notes (Signed)
Diana Glaze T. [name], MD, [place] [facility] [place], [address]  Phone: [phone]  FAX: [phone]  Diana Cook - 70 y.o. female  MRN [id number]  Date of Birth: [birth year]  Date: 08/01/2020  PCP: Abner Greenspan, MD  Referral: Abner Greenspan, MD  Chief Complaint  Patient presents with   Knee Pain    Right knee     This visit occurred during the SARS-CoV-2 public health emergency.  Safety protocols were in place, including screening questions prior to the visit, additional usage of staff PPE, and extensive cleaning of exam room while observing appropriate contact time as indicated for disinfecting solutions.   Subjective:   Diana Cook is a 70 y.o. very pleasant female patient with Body mass index is 28.32 kg/m. who presents with the following:  I did see her several years ago with knee pain, and I ultimately aspirated her knee and did an injection, and she got some good relief of symptoms.  At the time, also did a CT without contrast of the knee, she did have some mild degenerative changes only.  This was done secondary to her having a pacemaker.  A few days ago, it did feel like it was swollen a lot and it is hurting a lot.  Today, it is feeling quite a bit better if it is not at baseline.  She does have a compression sleeve with a doughnut and some light hinges.  She does say that this is helped some  Alleve and tylenol.   Today, it is feeling better some, but it is feeling a lot better now.  It does hurt some now.   She also has been having some radicular neck pain off and on, and she was started on some relatively low-dose gabapentin.  Review of Systems is noted in the HPI, as appropriate   Objective:   BP 96/62   Pulse 72   Temp 97.6 F (36.4 C) (Temporal)   Ht 5' 8.5" (1.74 m)   Wt 189 lb (85.7 kg)   SpO2 90%   BMI 28.32 kg/m    Knee:  L Gait: Normal heel toe pattern ROM: 0-1  15 Effusion: mild Echymosis or edema: none Patellar tendon NT Painful PLICA: neg Patellar grind: negative Medial and lateral patellar facet loading: negative medial and lateral joint lines: medial joint line tenderness Mcmurray's neg Flexion-pinch neg Varus and valgus stress: stable Lachman: neg Ant and Post drawer: neg Hip abduction, IR, ER: WNL Hip flexion str: 5/5 Hip abd: 5/5 Quad: 5/5 VMO atrophy:No Hamstring concentric and eccentric: 5/5   Radiology: No results found.  Assessment and Plan:     ICD-10-CM   1. Primary osteoarthritis of left knee  M17.12     2. Acute pain of left knee  M25.562     3. Effusion of left knee joint  M25.462     4. Cervical radiculopathy, acute  M54.12      Acute on chronic.  At this point, her knee seems to have calm down.  I do think wearing her brace intermittently and taking some NSAIDs as probably reasonable.  Hopefully she can get back to her baseline activity soon.  She does think that her gabapentin has been helping with her radicular neck pain, so I am going to help her titrate this up somewhat.  Patient Instructions  Voltaren 1% gel. Over the counter You can apply up to 4 times  a day Minimal is absorbed in the bloodstream Cost is about 9 dollars   You can also try Lidocaine 4% cream and patches  Youtube: Low impact Yoga There are also a bunch of apps  Gabapentin: Take 2 pills in the AM, 1 pill at lunch, and 2 pills at night  Then after 1 week, increase to 2 pills in the AM, 2 pills at lunch, and 2 pills at night.   No orders of the defined types were placed in this encounter.  There are no discontinued medications. No orders of the defined types were placed in this encounter.   Follow-up: No follow-ups on file.  Signed,  Maud Deed. [name], MD   Outpatient Encounter Medications as of 08/01/2020  Medication Sig   acetaminophen (TYLENOL) 500 MG tablet Take 500-1,000 mg by mouth every 6 (six) hours as  needed for mild pain or headache.    albuterol (VENTOLIN HFA) 108 (90 Base) MCG/ACT inhaler Inhale 2 puffs into the lungs every 4 (four) hours as needed for wheezing or shortness of breath.   aspirin EC 81 MG tablet Take 81 mg by mouth daily. Swallow whole.   atorvastatin (LIPITOR) 40 MG tablet Take 1 tablet by mouth once daily   Calcium Carb-Cholecalciferol (CALCIUM 600 + D PO) Take 1 tablet by mouth 2 (two) times daily.   FLUoxetine (PROZAC) 20 MG capsule Take 1 capsule by mouth once daily   furosemide (LASIX) 40 MG tablet Take 1 tablet (40 mg total) by mouth daily as needed for edema (For weight gain or swelling.).   levETIRAcetam (KEPPRA) 250 MG tablet Take 250 mg by mouth 2 (two) times daily.    OXYGEN Inhale 3 L into the lungs at bedtime.    STIOLTO RESPIMAT 2.5-2.5 MCG/ACT AERS INHALE 2 PUFFS BY MOUTH ONCE DAILY   tretinoin (RETIN-A) 0.1 % cream Apply topically at bedtime.   zolpidem (AMBIEN) 10 MG tablet TAKE 1/2 TO 1 (ONE-HALF TO ONE) TABLET BY MOUTH AT BEDTIME AS NEEDED FOR SLEEP   gabapentin (NEURONTIN) 100 MG capsule Take 2 capsules (200 mg total) by mouth 2 (two) times daily.   No facility-administered encounter medications on file as of 08/01/2020.

## 2020-08-01 ENCOUNTER — Encounter: Payer: Self-pay | Admitting: Family Medicine

## 2020-08-01 ENCOUNTER — Ambulatory Visit (INDEPENDENT_AMBULATORY_CARE_PROVIDER_SITE_OTHER): Payer: Medicare Other | Admitting: Family Medicine

## 2020-08-01 ENCOUNTER — Other Ambulatory Visit: Payer: Self-pay

## 2020-08-01 VITALS — BP 96/62 | HR 72 | Temp 97.6°F | Ht 68.5 in | Wt 189.0 lb

## 2020-08-01 DIAGNOSIS — M25462 Effusion, left knee: Secondary | ICD-10-CM | POA: Diagnosis not present

## 2020-08-01 DIAGNOSIS — M5412 Radiculopathy, cervical region: Secondary | ICD-10-CM

## 2020-08-01 DIAGNOSIS — M1712 Unilateral primary osteoarthritis, left knee: Secondary | ICD-10-CM | POA: Diagnosis not present

## 2020-08-01 DIAGNOSIS — I2510 Atherosclerotic heart disease of native coronary artery without angina pectoris: Secondary | ICD-10-CM | POA: Diagnosis not present

## 2020-08-01 DIAGNOSIS — M25562 Pain in left knee: Secondary | ICD-10-CM | POA: Diagnosis not present

## 2020-08-01 NOTE — Patient Instructions (Addendum)
Voltaren 1% gel. Over the counter You can apply up to 4 times a day Minimal is absorbed in the bloodstream Cost is about 9 dollars   You can also try Lidocaine 4% cream and patches  Youtube: Low impact Yoga There are also a bunch of apps  Gabapentin: Take 2 pills in the AM, 1 pill at lunch, and 2 pills at night  Then after 1 week, increase to 2 pills in the AM, 2 pills at lunch, and 2 pills at night.

## 2020-08-02 ENCOUNTER — Encounter: Payer: Self-pay | Admitting: Family Medicine

## 2020-08-15 DIAGNOSIS — Z87898 Personal history of other specified conditions: Secondary | ICD-10-CM | POA: Diagnosis not present

## 2020-08-15 DIAGNOSIS — R569 Unspecified convulsions: Secondary | ICD-10-CM | POA: Diagnosis not present

## 2020-08-15 DIAGNOSIS — G4733 Obstructive sleep apnea (adult) (pediatric): Secondary | ICD-10-CM | POA: Diagnosis not present

## 2020-08-21 ENCOUNTER — Encounter: Payer: Self-pay | Admitting: Internal Medicine

## 2020-08-21 ENCOUNTER — Other Ambulatory Visit: Payer: Self-pay

## 2020-08-21 ENCOUNTER — Ambulatory Visit (INDEPENDENT_AMBULATORY_CARE_PROVIDER_SITE_OTHER): Payer: Medicare Other | Admitting: Internal Medicine

## 2020-08-21 DIAGNOSIS — I442 Atrioventricular block, complete: Secondary | ICD-10-CM | POA: Diagnosis not present

## 2020-08-21 DIAGNOSIS — I2510 Atherosclerotic heart disease of native coronary artery without angina pectoris: Secondary | ICD-10-CM | POA: Diagnosis not present

## 2020-08-21 LAB — PACEMAKER DEVICE OBSERVATION

## 2020-08-21 NOTE — Patient Instructions (Signed)
Medication Instructions:  Your physician recommends that you continue on your current medications as directed. Please refer to the Current Medication list given to you today.  *If you need a refill on your cardiac medications before your next appointment, please call your pharmacy*   Lab Work: None ordered If you have labs (blood work) drawn today and your tests are completely normal, you will receive your results only by: [place] (if you have MyChart) OR A paper copy in the mail If you have any lab test that is abnormal or we need to change your treatment, we will call you to review the results.   Testing/Procedures: None ordered   Follow-Up: At [facility], you and your health needs are our priority.  As part of our continuing mission to provide you with exceptional heart care, we have created designated Provider Care Teams.  These Care Teams include your primary Cardiologist (physician) and Advanced Practice Providers (APPs -  Physician Assistants and Nurse Practitioners) who all work together to provide you with the care you need, when you need it.  We recommend signing up for the patient portal called "MyChart".  Sign up information is provided on this After Visit Summary.  MyChart is used to connect with patients for Virtual Visits (Telemedicine).  Patients are able to view lab/test results, encounter notes, upcoming appointments, etc.  Non-urgent messages can be sent to your provider as well.   To learn more about what you can do with MyChart, go to [email or web address].    Your next appointment:   Your physician wants you to follow-up in: 1 year You will receive a reminder letter in the mail two months in advance. If you don't receive a letter, please call our office to schedule the follow-up appointment.    The format for your next appointment:   In Person  Provider:   Virl Axe, MD   Other Instructions N/A

## 2020-08-21 NOTE — Progress Notes (Signed)
Patient ID: Diana Cook, female   DOB: 11/19/1950, 70 y.o.   MRN: [id number]      Patient Care Team: Diana Greenspan, MD as PCP - General Diana Cook, Diana Gave, MD as PCP - Cardiology (Cardiology) Diana Sprang, MD as Consulting Physician (Cardiology) Diana Cover., MD as Referring Physician (Ophthalmology) Diana Cook, Diana Fanny, MD as Consulting Physician (Family Medicine) Diana Castilla Forest Gleason, MD (General Surgery)   HPI  [facility] [place] is a 70 y.o. female Seen by both Drs. Allred and Lovena Le in the past who developed complete heart block and underwent pacing fall 2013 [facility]; underwent gen change 10/21    Her daughter  developed cancer;  died March 25, 2022; devastating.  She meets with her grandchildren regularly for breakfast. GD off to college in the fall, GS now 13  Not able to exercise 2/2 back pain; gaining weigh  Eval for orthostasis and CHB  >>   SPEP normal; cortisol normal   Today, the patient denies chest pain, shortness of breath, nocturnal dyspnea, and no orthopnea  There have been no palpitations, lightheadedness or syncope.  Complains of  peripheral edema. Uses prn diuretics   She feels old but associates it to her back pain, leg pain and  the numbness she experiences   She have been experiencing peripheral edema   She is still grieving over her daughter Diana Cook death    Granddaughter Diana Cook is outstanding with 14 rewards , 64 scholarships , she will be attending [employer] studying Orthodontist Diana Cook will be going to highschool   DATE TEST EF   9/13 LHC  No Obstr CAD ( by report)   9/13 Echo   55-65 %   12/14 Echo   55-65 %   8/18 Echo  55-60%   7/19 Echo  50-55%   10/20 CTA  CaScore 521 ? Severe stenosis  5/21 Echo  55-60%   5/21 LHC  LADm-30: OM2p-20   Date Cr K  8//18 0.83 4.4   10/19 0.95 4.5  1/22 1.06 5.1     Past Medical History:  Diagnosis Date   Allergic rhinitis    Arthritis    Asthma    as a child, mild now   Breast cancer ([place]) 12/2015    right breast cancer, lumpectomy and mammosite    Colon polyps    colonoscopy 7/08, tubular adenoma   Complete heart block ([place]) 8/13   s/p Cook implanted in [facility]   COPD (chronic obstructive pulmonary disease) ([place])    Myocardial infarction ([place]) 2011   Osteopenia 10/2015   Pacemaker    2011   Personal history of radiation therapy 2017   right breast ca, mammosite placed   Seizure disorder ([place])    Seizures ([place])    first one was when she was 70 years old    Small bowel obstruction ([place])    1988 and 2002   Tobacco abuse     Past Surgical History:  Procedure Laterality Date   ABDOMINAL HYSTERECTOMY     BREAST BIOPSY Right 2007   benign inflammatory changes, mass due to underwire bra   BREAST BIOPSY Left 01/02/2016   columnar cell changes without atypical hyperplasia.   BREAST BIOPSY Right 12/06/2015   rt breast mass 10:00, bx done at Diana Cook office, invasive ductal carcinoma   BREAST EXCISIONAL BIOPSY Left 01/02/2016   COLUMNAR CELL CHANGE AND HYPERPLASIA ASSOCIATED WITH LUMINAL AND STROMAL CALCIFICATIONS   BREAST LUMPECTOMY Right 01/02/2016   invasive mammary  carcinoma, clear margins, negative LN   BREAST LUMPECTOMY WITH SENTINEL LYMPH NODE BIOPSY Right 01/02/2016   pT1c, N0; ER/ PR 100%; Her 2 neu not over expressed: BREAST LUMPECTOMY WITH SENTINEL LYMPH NODE BX;  Surgeon: Diana Bellow, MD;  Location: Diana Cook;  Service: General;  Laterality: Right;   CARDIAC CATHETERIZATION     CATARACT EXTRACTION Bilateral    COLONOSCOPY  10/2015   Diana Cook   COLONOSCOPY WITH PROPOFOL N/A 08/19/2018   Procedure: COLONOSCOPY WITH PROPOFOL;  Surgeon: Diana Manifold, MD;  Location: Diana Cook;  Service: Cook;  Laterality: N/A;   ESOPHAGOGASTRODUODENOSCOPY (EGD) WITH PROPOFOL N/A 08/19/2018   Procedure: ESOPHAGOGASTRODUODENOSCOPY (EGD) WITH PROPOFOL;  Surgeon: Diana Manifold, MD;  Location: Diana Cook;  Service: Cook;  Laterality: N/A;    EXPLORATORY LAPAROTOMY  01/25/2001   Exploratory laparotomy, lysis of adhesions, identification of internal hernia secondary to omental adhesion. Prolonged postoperative ileus.   gyn surgery  1993   hysterectomy- form endometriosis   LAPAROSCOPY     PACEMAKER INSERTION  10/24/11   Diana Cook implanted by Diana Cook at [facility] Cook N/A 12/05/2019   Procedure: Diana Cook;  Surgeon: Diana Sprang, MD;  Location: Diana Cook;  Service: Cardiovascular;  Laterality: N/A;   RIGHT/LEFT HEART CATH AND CORONARY ANGIOGRAPHY Bilateral 07/19/2019   Procedure: RIGHT/LEFT HEART CATH AND CORONARY ANGIOGRAPHY;  Surgeon: Diana Bush, MD;  Location: Diana Cook;  Service: Cardiovascular;  Laterality: Bilateral;   TEMPORARY PACEMAKER N/A 12/05/2019   Procedure: TEMPORARY PACEMAKER;  Surgeon: Diana Sprang, MD;  Location: Diana Cook;  Service: Cardiovascular;  Laterality: N/A;    Current Outpatient Medications  Medication Sig Dispense Refill   acetaminophen (TYLENOL) 500 MG tablet Take 500-1,000 mg by mouth every 6 (six) hours as needed for mild pain or headache.      albuterol (VENTOLIN HFA) 108 (90 Base) MCG/ACT inhaler Inhale 2 puffs into the lungs every 4 (four) hours as needed for wheezing or shortness of breath. 18 g 1   aspirin EC 81 MG tablet Take 81 mg by mouth daily. Swallow whole.     atorvastatin (LIPITOR) 40 MG tablet Take 1 tablet by mouth once daily 90 tablet 0   Calcium Carb-Cholecalciferol (CALCIUM 600 + D PO) Take 1 tablet by mouth 2 (two) times daily.     FLUoxetine (PROZAC) 20 MG capsule Take 1 capsule by mouth once daily 90 capsule 0   furosemide (LASIX) 40 MG tablet Take 1 tablet (40 mg total) by mouth daily as needed for edema (For weight gain or swelling.). 90 tablet 3   gabapentin (NEURONTIN) 100 MG capsule Take 2 capsules (200 mg total) by mouth 2 (two)  times daily. (Patient taking differently: Take by mouth. 200 MG IN THE MORNING, 100 MG IN THE AFTERNOON AND 200 MG AT NIGHT.) 120 capsule 5   levETIRAcetam (KEPPRA) 250 MG tablet Take 250 mg by mouth 2 (two) times daily.      OXYGEN Inhale 3 L into the lungs at bedtime.      STIOLTO RESPIMAT 2.5-2.5 MCG/ACT AERS INHALE 2 PUFFS BY MOUTH ONCE DAILY 4 g 0   tretinoin (RETIN-A) 0.1 % cream Apply topically at bedtime. 45 g 1   zolpidem (AMBIEN) 10 MG tablet TAKE 1/2 TO 1 (ONE-HALF TO ONE) TABLET BY MOUTH AT BEDTIME AS NEEDED FOR SLEEP 30 tablet 0   No  current facility-administered medications for this visit.    Allergies  Allergen Reactions   Codeine Nausea And Vomiting   Morphine And Related Nausea Only    Review of Systems negative except from HPI and PMH  Physical Exam: BP 120/78 (BP Location: Left Arm, Patient Position: Sitting, Cuff Size: Normal)   Pulse 66   Ht 5' 8.5" (1.74 m)   Wt 193 lb (87.5 kg)   BMI 28.92 kg/m  Well developed and well nourished in no acute distress HENT normal Neck supple with JVP-flat Lungs Clear Device pocket well healed; without hematoma or erythema.  There is no tethering  Regular rate and rhythm, no  gallop No  murmur Abd-soft with active BS No Clubbing cyanosis LE edema Skin-warm and dry A & Oriented  Grossly normal sensory and motor function  ECG P-synchronous/ AV  pacing     Assessment and  Plan:  High-grade heart block  Pacemaker-[employer]    Sinus node dysfunction and chronotropic incompetence   Exercise intolerance  Orthostatic hypotension or presyncope  Grieving   Orthostasis much better.  Using her diuretics as needed.  Complete heart block.  No evidence of pacemaker associated cardiomyopathy  Discussed again the loss of her daughter   Bobbye Morton is largely orthopedic at this point     [place] as a scribe for Virl Axe, MD.,have documented all relevant documentation on the behalf of Virl Axe, MD,as directed by  Virl Axe, MD while in the presence of Virl Axe, MD.  I, Virl Axe, MD, have reviewed all documentation for this visit. The documentation on 08/21/20 for the exam, diagnosis, procedures, and orders are all accurate and complete.

## 2020-08-22 ENCOUNTER — Encounter: Payer: Self-pay | Admitting: *Deleted

## 2020-08-22 ENCOUNTER — Telehealth: Payer: Self-pay | Admitting: *Deleted

## 2020-08-22 NOTE — Telephone Encounter (Signed)
Diana Cook left voicemail on triage line stating she has been on Gabapentin since Christmas and wants to come off of it.  She is currently taking 100 mg 2 capsules in the am, 1 capsule in the afternoon and 2 capsules at bedtime. She states her neck pain, that the Gabapentin was prescribed for,  does not bother her anymore and the gabapentin is just making her real tired.  She states she would rather deal with her back pain in different ways and right now it is not bothering her.  She is asking for a return call with weaning instructions.  Please advise.

## 2020-08-22 NOTE — Telephone Encounter (Signed)
Take it down to 1 cap tid for 3-4 days  Then 1 cap at bedtime for 3-4 days Then stop   Let me know if any problems  Thanks for the heads up

## 2020-08-22 NOTE — Telephone Encounter (Signed)
Left Diana Cook a message that I would send her gabapentin weaning instructions to her MyChart.  I ask that she call us back if she has any questions.

## 2020-08-23 ENCOUNTER — Other Ambulatory Visit: Payer: Self-pay | Admitting: Pulmonary Disease

## 2020-08-23 NOTE — Telephone Encounter (Signed)
Diana Cook reviewed her weaning instructions on MyChart at 9:57 pm on 08/22/2020.

## 2020-09-03 DIAGNOSIS — Z20822 Contact with and (suspected) exposure to covid-19: Secondary | ICD-10-CM | POA: Diagnosis not present

## 2020-09-06 ENCOUNTER — Ambulatory Visit (INDEPENDENT_AMBULATORY_CARE_PROVIDER_SITE_OTHER): Payer: Medicare Other

## 2020-09-06 DIAGNOSIS — I442 Atrioventricular block, complete: Secondary | ICD-10-CM | POA: Diagnosis not present

## 2020-09-06 LAB — CUP PACEART REMOTE DEVICE CHECK
Battery Remaining Longevity: 156 mo
Battery Remaining Percentage: 100 %
Brady Statistic RA Percent Paced: 5 %
Brady Statistic RV Percent Paced: 99 %
Date Time Interrogation Session: 20220714015000
Implantable Lead Implant Date: 20130830
Implantable Lead Implant Date: 20130830
Implantable Lead Location: 753859
Implantable Lead Location: 753860
Implantable Lead Model: 4456
Implantable Lead Model: 4479
Implantable Lead Serial Number: 473325
Implantable Lead Serial Number: 523784
Implantable Pulse Generator Implant Date: 20211011
Lead Channel Impedance Value: 452 Ohm
Lead Channel Impedance Value: 620 Ohm
Lead Channel Pacing Threshold Amplitude: 0.4 V
Lead Channel Pacing Threshold Pulse Width: 0.4 ms
Lead Channel Setting Pacing Amplitude: 2 V
Lead Channel Setting Pacing Amplitude: 2 V
Lead Channel Setting Pacing Pulse Width: 0.4 ms
Lead Channel Setting Sensing Sensitivity: 2.5 mV
Pulse Gen Serial Number: 949303

## 2020-09-24 ENCOUNTER — Encounter: Payer: Self-pay | Admitting: Pulmonary Disease

## 2020-09-24 ENCOUNTER — Other Ambulatory Visit: Payer: Self-pay

## 2020-09-24 ENCOUNTER — Ambulatory Visit (INDEPENDENT_AMBULATORY_CARE_PROVIDER_SITE_OTHER): Payer: Medicare Other | Admitting: Pulmonary Disease

## 2020-09-24 VITALS — BP 118/74 | HR 79 | Temp 97.6°F | Ht 68.5 in | Wt 195.0 lb

## 2020-09-24 DIAGNOSIS — J432 Centrilobular emphysema: Secondary | ICD-10-CM

## 2020-09-24 DIAGNOSIS — J439 Emphysema, unspecified: Secondary | ICD-10-CM | POA: Diagnosis not present

## 2020-09-24 DIAGNOSIS — R0602 Shortness of breath: Secondary | ICD-10-CM | POA: Diagnosis not present

## 2020-09-24 DIAGNOSIS — G4736 Sleep related hypoventilation in conditions classified elsewhere: Secondary | ICD-10-CM | POA: Diagnosis not present

## 2020-09-24 DIAGNOSIS — Z87891 Personal history of nicotine dependence: Secondary | ICD-10-CM | POA: Diagnosis not present

## 2020-09-24 DIAGNOSIS — R490 Dysphonia: Secondary | ICD-10-CM | POA: Diagnosis not present

## 2020-09-24 DIAGNOSIS — R911 Solitary pulmonary nodule: Secondary | ICD-10-CM

## 2020-09-24 NOTE — Patient Instructions (Signed)
Continue Stiolto.  Continue your oxygen at nighttime.  Your oxygen level maintained at no lower than 89% during your walk today, so in this regard you are still doing well.  We are referring to ear nose and throat for evaluation of your hoarseness.  We will see him in follow-up in 3 months time call sooner should any new problems arise.

## 2020-09-24 NOTE — Progress Notes (Signed)
Subjective:    Patient ID: Diana Cook, female    DOB: 1950-12-22, 70 y.o.   MRN: [id number] Chief Complaint  Patient presents with   Follow-up    Sob with exertion and dry cough. On 3.5L QHS    HPI This is a 70 year old former smoker (quit 2012, 42 PY) who follows here for the issue of dyspnea on exertion in the setting of emphysema with preserved FEV1 and nocturnal hypoxemia related to emphysema.  She is compliant with oxygen nocturnally at 3-1/2 L/minutes.  She notes that she sleeps well with this and feels refreshed in the morning.  She feels also less fatigue during the day when she uses it.  She has a history of complete heart block and is status post pacemaker placement.  Had pacer revision in October 2021 by Dr. Caryl Comes.  She continues to use Stiolto and as needed albuterol.  Albuterol use is very rare.  She does not endorse any chest pain, no fevers, chills or sweats.  No calf tenderness or edema.  No sleep disturbance.  She has noted some hoarseness she does not seem to clear.  She is not on inhaled corticosteroids.    She is enrolled in the lung cancer screening program she had a suspicious reading on the chest CT performed 24 May 2020 showing nodule on the left lower lobe with some minimal growth.  This was followed with a PET/CT 12 April which showed no hypermetabolic activity in this area.  We will have follow-up in 6 months with a CT.  Review of Systems A 10 point review of systems was performed and it is as noted above otherwise negative.  Patient Active Problem List   Diagnosis Date Noted   Orthostatic lightheadedness 05/16/2020   Radicular pain in left arm 03/02/2020   Neck pain on left side 02/15/2020   Paresthesia 02/15/2020   Chronic respiratory failure with hypoxia ([place]) 11/17/2019   Chronic heart failure with preserved ejection fraction (HFpEF) ([place]) 08/11/2019   Coronary artery disease involving native coronary artery of native heart without angina pectoris  06/20/2019   Gastroesophageal reflux disease    Stomach irritation    Abnormal CT scan, esophagus    Polyp of ascending colon    Personal history of tobacco use, presenting hazards to health 01/18/2018   Grade I diastolic dysfunction 28/31/5176   Globus pharyngeus 12/21/2017   Chronic obstructive pulmonary disease ([place]) 12/15/2017   Hyperlipidemia LDL goal <70 12/07/2017   Seizures ([place]) 10/01/2016   Elevated glucose 08/31/2016   Grief reaction 04/11/2016   Malignant neoplasm of upper-outer quadrant of right breast in female, estrogen receptor positive ([place]) 12/11/2015   Osteoporosis 11/22/2015   Estrogen deficiency 09/07/2015   Bruising 09/07/2015   Wrinkles 09/07/2015   Post herpetic neuralgia 09/01/2014   Encounter for Medicare annual wellness exam 07/11/2014   Routine general medical examination at a health care facility 06/26/2013   H/O small bowel obstruction 04/15/2013   Dyspnea on exertion 02/10/2013   Complete heart block ([place]) 11/03/2011   Pacemaker-[employer] 11/03/2011   Lumbar spinal stenosis 09/02/2011   Shortness of breath 10/30/2010   POSTMENOPAUSAL STATUS 08/06/2009   HIDRADENITIS SUPPURATIVA 06/26/2008   HERNIATED DISC 12/14/2007   BACK, LOWER, PAIN 12/02/2007   HX, PERSONAL, COLONIC POLYPS 09/14/2006   Former smoker 06/10/2006   ASTHMA 06/10/2006   H/O idiopathic seizure 06/10/2006   Social History   Tobacco Use   Smoking status: Former    Packs/day: 1.00  Years: 42.00    Pack years: 42.00    Types: Cigarettes    Quit date: 11/11/2010    Years since quitting: 9.8   Smokeless tobacco: Never  Substance Use Topics   Alcohol use: Yes    Alcohol/week: 4.0 standard drinks    Types: 4 Glasses of wine per week    Comment: weekly   Allergies  Allergen Reactions   Codeine Nausea And Vomiting   Morphine And Related Nausea Only   Current Meds  Medication Sig   acetaminophen (TYLENOL) 500 MG tablet Take 500-1,000 mg by mouth every 6 (six)  hours as needed for mild pain or headache.    albuterol (VENTOLIN HFA) 108 (90 Base) MCG/ACT inhaler Inhale 2 puffs into the lungs every 4 (four) hours as needed for wheezing or shortness of breath.   aspirin EC 81 MG tablet Take 81 mg by mouth daily. Swallow whole.   atorvastatin (LIPITOR) 40 MG tablet Take 1 tablet by mouth once daily   Calcium Carb-Cholecalciferol (CALCIUM 600 + D PO) Take 1 tablet by mouth 2 (two) times daily.   FLUoxetine (PROZAC) 20 MG capsule Take 1 capsule by mouth once daily   furosemide (LASIX) 40 MG tablet Take 1 tablet (40 mg total) by mouth daily as needed for edema (For weight gain or swelling.).   levETIRAcetam (KEPPRA) 250 MG tablet Take 250 mg by mouth 2 (two) times daily.    OXYGEN Inhale 3 L into the lungs at bedtime.    STIOLTO RESPIMAT 2.5-2.5 MCG/ACT AERS INHALE 2 PUFFS BY MOUTH ONCE DAILY   tretinoin (RETIN-A) 0.1 % cream Apply topically at bedtime.   zolpidem (AMBIEN) 10 MG tablet TAKE 1/2 TO 1 (ONE-HALF TO ONE) TABLET BY MOUTH AT BEDTIME AS NEEDED FOR SLEEP   Immunization History  Administered Date(s) Administered   Influenza Whole 02/24/2005   Influenza, High Dose Seasonal PF 11/04/2018, 11/22/2019   Influenza,inj,Quad PF,6+ Mos 12/03/2012, 12/09/2013, 01/16/2015, 02/06/2016, 11/04/2016, 12/08/2017   PFIZER(Purple Top)SARS-COV-2 Vaccination 02/28/2019, 03/21/2019, 11/14/2019   Pneumococcal Conjugate-13 07/11/2014   Pneumococcal Polysaccharide-23 03/21/2005, 07/26/2015   Td 01/09/1999, 06/14/2007   Zoster, Live 09/07/2015       Objective:   Physical Exam BP 118/74 (BP Location: Left Arm, Cuff Size: Normal)   Pulse 79   Temp 97.6 F (36.4 C) (Temporal)   Ht 5' 8.5" (1.74 m)   Wt 195 lb (88.5 kg)   SpO2 92%   BMI 29.22 kg/m   GENERAL: Well-developed well-nourished woman in no acute respiratory distress.  She is fully ambulatory.  No conversational dyspnea.  Does sound hoarse. HEAD: Normocephalic, atraumatic. EYES: Pupils equal, round,  reactive to light.  No scleral icterus. MOUTH: Nose/mouth/throat not examined due to masking requirements for COVID 19. NECK: Supple. No thyromegaly. No nodules. No JVD.  Trachea midline. PULMONARY: Symmetrical air entry, clear with no adventitious sounds CARDIOVASCULAR: S1 and S2. Regular rate and rhythm. No rubs murmurs gallops heard. GASTROINTESTINAL: Nondistended. MUSCULOSKELETAL: No joint deformity, no clubbing, no edema. NEUROLOGIC: Awake, alert, oriented x4, no focal deficits. SKIN: Intact,warm,dry, on limited exam no rashes. PSYCH: Normal mood and behavior.  Ambulatory oximetry was performed today: At rest O2 sats were 91%, nadir at maximal exercise was 89%.  Patient recovered quickly to baseline of 91%.    Assessment & Plan:     ICD-10-CM   1. Centrilobular emphysema (HCC)  J43.2    COPD with emphysema Continue Stiolto Has preserved FEV1    2. SOB (shortness of breath) on exertion  R06.02    Multifactorial Pulmonary and cardiac factors Stable     3. Voice hoarseness  R49.0 Ambulatory referral to ENT   Refer to ENT    4. Nocturnal hypoxemia due to emphysema (HCC)  J43.9    G47.36    Continue oxygen at 3-1/2 L/min nocturnally    5. Incidental lung nodule, greater than or equal to 100mm  R91.1    Found on low-dose CT PET/CT negative Follow-up CT 6 months    6. Ex-heavy cigarette smoker (20-39 per day)  Z87.891    Continue lung cancer screening No evidence of relapse     Orders Placed This Encounter  Procedures   Ambulatory referral to ENT    Referral Priority:   Routine    Referral Type:   Consultation    Referral Reason:   Specialty Services Required    Requested Specialty:   Otolaryngology    Number of Visits Requested:   1   Continue Stiolto.  She will need repeat CT scan of the chest in 6 months time from her April imaging this will be sometime around October.  We will see her in follow-up in 3 months time with regards to her COPD.  She is to contact us  prior to that time should any new difficulties arise.  Renold Don, MD Advanced Bronchoscopy PCCM [facility]    *This note was dictated using voice recognition software/Dragon.  Despite best efforts to proofread, errors can occur which can change the meaning.  Any change was purely unintentional.

## 2020-09-29 NOTE — Progress Notes (Signed)
Remote pacemaker transmission.   

## 2020-10-01 ENCOUNTER — Other Ambulatory Visit: Payer: Self-pay

## 2020-10-01 ENCOUNTER — Telehealth: Payer: Self-pay | Admitting: *Deleted

## 2020-10-01 ENCOUNTER — Ambulatory Visit
Admission: EM | Admit: 2020-10-01 | Discharge: 2020-10-01 | Disposition: A | Discharge status: Home / Self Care | Payer: Medicare Other | Attending: Emergency Medicine | Admitting: Emergency Medicine

## 2020-10-01 DIAGNOSIS — L237 Allergic contact dermatitis due to plants, except food: Secondary | ICD-10-CM | POA: Diagnosis not present

## 2020-10-01 MED ORDER — METHYLPREDNISOLONE SODIUM SUCC 125 MG IJ SOLR
80.0000 mg | Freq: Once | INTRAMUSCULAR | Status: AC
  Administered 2020-10-01: 80 mg via INTRAMUSCULAR

## 2020-10-01 MED ORDER — PREDNISONE 10 MG (21) PO TBPK: ORAL_TABLET | Freq: Every day | ORAL | 0 refills | Status: DC

## 2020-10-01 NOTE — ED Triage Notes (Signed)
Patient presents to Urgent Care with complaints of poison ivy rash x 2 days ago. Pt states she was exposed to poison ivy when working in her garden on Sat. He generalized body rash appeared Sunday. Applying oils to rash with some relief.   Denies fever.

## 2020-10-01 NOTE — Discharge Instructions (Addendum)
You were given an injection of a steroid called Solu-Medrol.  Start the prednisone taper tomorrow as directed.    Take Claritin or Allegra as directed.    Follow up with your primary care provider if your symptoms are not improving.

## 2020-10-01 NOTE — Telephone Encounter (Signed)
Aware, will watch for correspondence  

## 2020-10-01 NOTE — Telephone Encounter (Signed)
Patient left a voicemail stating that she has poison ivy and has not had it for years. Patient stated when she gets poison ivy it is bad. Patient stated that it is spreading fast. Patient wants to know if she can come to the office and get a shot.

## 2020-10-01 NOTE — ED Provider Notes (Signed)
Diana Cook    CSN: [id number] Arrival date & time: 10/01/20  1230      History   Chief Complaint Chief Complaint  Patient presents with   Rash    Poison Ivy x 2 days     HPI Diana Cook is a 70 y.o. female.  Patient presents with 2-day history of poison ivy rash on her arms and legs.  She was exposed to poison ivy while working in her yard.  She states the rash is pruritic.  No lesions in her mouth or eyes.  She denies fever, chills, or other symptoms.  Treatment attempted at home with essential oils.  Her medical history includes COPD, breast cancer, seizures, MI, pacemaker, heart block.  The history is provided by the patient and medical records.   Past Medical History:  Diagnosis Date   Allergic rhinitis    Arthritis    Asthma    as a child, mild now   Breast cancer ([place]) 12/2015   right breast cancer, lumpectomy and mammosite    Colon polyps    colonoscopy 7/08, tubular adenoma   Complete heart block ([place]) 8/13   s/p PPM implanted in [place] [facility]   COPD (chronic obstructive pulmonary disease) ([place])    Myocardial infarction ([place]) 2011   Osteopenia 10/2015   Pacemaker    2011   Personal history of radiation therapy 2017   right breast ca, mammosite placed   Seizure disorder ([place])    Seizures ([place])    first one was when she was 70 years old    Small bowel obstruction ([place])    1988 and 2002   Tobacco abuse     Patient Active Problem List   Diagnosis Date Noted   Orthostatic lightheadedness 05/16/2020   Radicular pain in left arm 03/02/2020   Neck pain on left side 02/15/2020   Paresthesia 02/15/2020   Chronic respiratory failure with hypoxia ([place]) 11/17/2019   Chronic heart failure with preserved ejection fraction (HFpEF) ([place]) 08/11/2019   Coronary artery disease involving native coronary artery of native heart without angina pectoris 06/20/2019   Gastroesophageal reflux disease    Stomach irritation    Abnormal CT scan, esophagus    Polyp  of ascending colon    Personal history of tobacco use, presenting hazards to health 01/18/2018   Grade I diastolic dysfunction 27/25/3664   Globus pharyngeus 12/21/2017   Chronic obstructive pulmonary disease ([place]) 12/15/2017   Hyperlipidemia LDL goal <70 12/07/2017   Seizures ([place]) 10/01/2016   Elevated glucose 08/31/2016   Grief reaction 04/11/2016   Malignant neoplasm of upper-outer quadrant of right breast in female, estrogen receptor positive ([place]) 12/11/2015   Osteoporosis 11/22/2015   Estrogen deficiency 09/07/2015   Bruising 09/07/2015   Wrinkles 09/07/2015   Post herpetic neuralgia 09/01/2014   Encounter for Medicare annual wellness exam 07/11/2014   Routine general medical examination at a health care facility 06/26/2013   H/O small bowel obstruction 04/15/2013   Dyspnea on exertion 02/10/2013   Complete heart block ([place]) 11/03/2011   Pacemaker-[employer] 11/03/2011   Lumbar spinal stenosis 09/02/2011   Shortness of breath 10/30/2010   POSTMENOPAUSAL STATUS 08/06/2009   HIDRADENITIS SUPPURATIVA 06/26/2008   HERNIATED DISC 12/14/2007   BACK, LOWER, PAIN 12/02/2007   HX, PERSONAL, COLONIC POLYPS 09/14/2006   Former smoker 06/10/2006   ASTHMA 06/10/2006   H/O idiopathic seizure 06/10/2006    Past Surgical History:  Procedure Laterality Date   ABDOMINAL HYSTERECTOMY  BREAST BIOPSY Right 2007   benign inflammatory changes, mass due to underwire bra   BREAST BIOPSY Left 01/02/2016   columnar cell changes without atypical hyperplasia.   BREAST BIOPSY Right 12/06/2015   rt breast mass 10:00, bx done at Dr. Curly [place] office, invasive ductal carcinoma   BREAST EXCISIONAL BIOPSY Left 01/02/2016   COLUMNAR CELL CHANGE AND HYPERPLASIA ASSOCIATED WITH LUMINAL AND STROMAL CALCIFICATIONS   BREAST LUMPECTOMY Right 01/02/2016   invasive mammary carcinoma, clear margins, negative LN   BREAST LUMPECTOMY WITH SENTINEL LYMPH NODE BIOPSY Right 01/02/2016   pT1c, N0; ER/ PR  100%; Her 2 neu not over expressed: BREAST LUMPECTOMY WITH SENTINEL LYMPH NODE BX;  Surgeon: [name], MD;  Location: [facility] ORS;  Service: General;  Laterality: Right;   CARDIAC CATHETERIZATION     CATARACT EXTRACTION Bilateral    COLONOSCOPY  10/2015   Dr Ardis Hughs   COLONOSCOPY WITH PROPOFOL N/A 08/19/2018   Procedure: COLONOSCOPY WITH PROPOFOL;  Surgeon: Virgel Manifold, MD;  Location: [facility] ENDOSCOPY;  Service: Endoscopy;  Laterality: N/A;   ESOPHAGOGASTRODUODENOSCOPY (EGD) WITH PROPOFOL N/A 08/19/2018   Procedure: ESOPHAGOGASTRODUODENOSCOPY (EGD) WITH PROPOFOL;  Surgeon: Virgel Manifold, MD;  Location: [facility] ENDOSCOPY;  Service: Endoscopy;  Laterality: N/A;   EXPLORATORY LAPAROTOMY  01/25/2001   Exploratory laparotomy, lysis of adhesions, identification of internal hernia secondary to omental adhesion. Prolonged postoperative ileus.   gyn surgery  1993   hysterectomy- form endometriosis   LAPAROSCOPY     PACEMAKER INSERTION  10/24/11   [employer] Advantio dual chamber PPM implanted by Dr Bunnie Philips at [facility] in [place] N/A 12/05/2019   Procedure: [place];  Surgeon: Deboraha Sprang, MD;  Location: [facility];  Service: Cardiovascular;  Laterality: N/A;   RIGHT/LEFT HEART CATH AND CORONARY ANGIOGRAPHY Bilateral 07/19/2019   Procedure: RIGHT/LEFT HEART CATH AND CORONARY ANGIOGRAPHY;  Surgeon: Nelva Bush, MD;  Location: [facility];  Service: Cardiovascular;  Laterality: Bilateral;   TEMPORARY PACEMAKER N/A 12/05/2019   Procedure: TEMPORARY PACEMAKER;  Surgeon: Deboraha Sprang, MD;  Location: [place] [facility];  Service: Cardiovascular;  Laterality: N/A;    OB History     Gravida  3   Para  2   Term      Preterm      AB  1   Living         SAB  1   IAB      Ectopic      Multiple      Live Births           Obstetric Comments  1st Menstrual Cycle:  12 1st  Pregnancy:  22           Home Medications    Prior to Admission medications   Medication Sig Start Date End Date Taking? Authorizing Provider  predniSONE (STERAPRED UNI-PAK 21 TAB) 10 MG (21) TBPK tablet Take by mouth daily. As directed 10/02/20  Yes Sharion Balloon, NP  acetaminophen (TYLENOL) 500 MG tablet Take 500-1,000 mg by mouth every 6 (six) hours as needed for mild pain or headache.     [provider]  albuterol (VENTOLIN HFA) 108 (90 Base) MCG/ACT inhaler Inhale 2 puffs into the lungs every 4 (four) hours as needed for wheezing or shortness of breath. 03/21/20   Tower, Wynelle Fanny, MD  aspirin EC 81 MG tablet Take 81 mg by mouth daily. Swallow whole.  [provider]  atorvastatin (LIPITOR) 40 MG tablet Take 1 tablet by mouth once daily 07/17/20   Deboraha Sprang, MD  Calcium Carb-Cholecalciferol (CALCIUM 600 + D PO) Take 1 tablet by mouth 2 (two) times daily.    [provider]  FLUoxetine (PROZAC) 20 MG capsule Take 1 capsule by mouth once daily 04/05/20   Tower, Wynelle Fanny, MD  furosemide (LASIX) 40 MG tablet Take 1 tablet (40 mg total) by mouth daily as needed for edema (For weight gain or swelling.). 11/18/19   End, Harrell Gave, MD  levETIRAcetam (KEPPRA) 250 MG tablet Take 250 mg by mouth 2 (two) times daily.  11/02/19   [provider]  OXYGEN Inhale 3 L into the lungs at bedtime.     [provider]  STIOLTO RESPIMAT 2.5-2.5 MCG/ACT AERS INHALE 2 PUFFS BY MOUTH ONCE DAILY 08/23/20   Tyler Pita, MD  tretinoin (RETIN-A) 0.1 % cream Apply topically at bedtime. 03/21/20   Tower, Wynelle Fanny, MD  zolpidem (AMBIEN) 10 MG tablet TAKE 1/2 TO 1 (ONE-HALF TO ONE) TABLET BY MOUTH AT BEDTIME AS NEEDED FOR SLEEP 04/27/20   Tower, Wynelle Fanny, MD    Family History Family History  Problem Relation Age of Onset   Stroke Mother    Heart disease Mother 23       MI and CABG   Dementia Mother    Coronary artery disease Father    Parkinsonism Father     Cancer - Cervical Daughter 36       died 04-08-2022   Heart attack Brother 61   Colon cancer Neg Hx    Breast cancer Neg Hx     Social History Social History   Tobacco Use   Smoking status: Former    Packs/day: 1.00    Years: 42.00    Pack years: 42.00    Types: Cigarettes    Quit date: 11/11/2010    Years since quitting: 9.8   Smokeless tobacco: Never  Vaping Use   Vaping Use: Never used  Substance Use Topics   Alcohol use: Yes    Alcohol/week: 4.0 standard drinks    Types: 4 Glasses of wine per week    Comment: weekly   Drug use: No     Allergies   Codeine and Morphine and related   Review of Systems Review of Systems  Constitutional:  Negative for chills and fever.  Respiratory:  Negative for cough and shortness of breath.   Cardiovascular:  Negative for chest pain and palpitations.  Skin:  Positive for rash. Negative for color change.  All other systems reviewed and are negative.   Physical Exam Triage Vital Signs ED Triage Vitals  Enc Vitals Group     BP 10/01/20 1256 (!) 84/55     Pulse Rate 10/01/20 1256 71     Resp 10/01/20 1256 18     Temp 10/01/20 1256 98.2 F (36.8 C)     Temp Source 10/01/20 1256 Oral     SpO2 10/01/20 1256 92 %     Weight --      Height --      Head Circumference --      Peak Flow --      Pain Score 10/01/20 1305 0     Pain Loc --      Pain Edu? --      Excl. in [place]? --    No data found.  Updated Vital Signs BP 111/69 (BP Location: Left  Arm)   Pulse 71   Temp 98.2 F (36.8 C) (Oral)   Resp 18   SpO2 92%   Visual Acuity Right Eye Distance:   Left Eye Distance:   Bilateral Distance:    Right Eye Near:   Left Eye Near:    Bilateral Near:     Physical Exam Vitals and nursing note reviewed.  Constitutional:      General: She is not in acute distress.    Appearance: She is well-developed. She is not ill-appearing.  HENT:     Head: Normocephalic and atraumatic.     Mouth/Throat:     Mouth: Mucous membranes are  moist.     Pharynx: Oropharynx is clear.  Eyes:     Conjunctiva/sclera: Conjunctivae normal.  Cardiovascular:     Rate and Rhythm: Normal rate and regular rhythm.     Heart sounds: Normal heart sounds.  Pulmonary:     Effort: Pulmonary effort is normal. No respiratory distress.     Breath sounds: Normal breath sounds.  Abdominal:     Palpations: Abdomen is soft.     Tenderness: There is no abdominal tenderness.  Musculoskeletal:     Cervical back: Neck supple.  Skin:    General: Skin is warm and dry.     Findings: Rash present.     Comments: Pink papular rash on lower legs and forearms.  Neurological:     General: No focal deficit present.     Mental Status: She is alert and oriented to person, place, and time.  Psychiatric:        Mood and Affect: Mood normal.        Behavior: Behavior normal.     UC Treatments / Results  Labs (all labs ordered are listed, but only abnormal results are displayed) Labs Reviewed - No data to display  EKG   Radiology No results found.  Procedures Procedures (including critical care time)  Medications Ordered in UC Medications  methylPREDNISolone sodium succinate (SOLU-MEDROL) 125 mg/2 mL injection 80 mg (has no administration in time range)    Initial Impression / Assessment and Plan / UC Course  I have reviewed the triage vital signs and the nursing notes.  Pertinent labs & imaging results that were available during my care of the patient were reviewed by me and considered in my medical decision making (see chart for details).   Poison ivy dermatitis.  Solu-Medrol given here.  Starting prednisone taper tomorrow.  Instructed patient to take Claritin or Allegra also.  Education provided on poison ivy rash.  Instructed her to follow-up with her PCP if her symptoms are not improving.  She agrees to plan of care.  Final Clinical Impressions(s) / UC Diagnoses   Final diagnoses:  Allergic dermatitis due to poison ivy      Discharge Instructions      You were given an injection of a steroid called Solu-Medrol.  Start the prednisone taper tomorrow as directed.    Take Claritin or Allegra as directed.    Follow up with your primary care provider if your symptoms are not improving.             ED Prescriptions     Medication Sig Dispense Auth. Provider   predniSONE (STERAPRED UNI-PAK 21 TAB) 10 MG (21) TBPK tablet Take by mouth daily. As directed 21 tablet Sharion Balloon, NP      PDMP not reviewed this encounter.   Sharion Balloon, NP 10/01/20 1332

## 2020-10-01 NOTE — Telephone Encounter (Signed)
UC is ok- we are full and cannot add pt right now, air conditioning is out

## 2020-10-01 NOTE — Telephone Encounter (Signed)
Patient notified as instructed by telephone and verbalized understanding. Patient was advised that there are openings at other offices in [place]. Patient stated that she does not want to travel to [place]. Patient was given information on the [facility]. Patient stated that she is close to the UC in [place] and will plan on heading there shortly.

## 2020-10-19 DIAGNOSIS — K219 Gastro-esophageal reflux disease without esophagitis: Secondary | ICD-10-CM | POA: Diagnosis not present

## 2020-10-19 DIAGNOSIS — R490 Dysphonia: Secondary | ICD-10-CM | POA: Diagnosis not present

## 2020-10-22 ENCOUNTER — Telehealth: Payer: Self-pay | Admitting: Internal Medicine

## 2020-10-22 ENCOUNTER — Other Ambulatory Visit: Payer: Self-pay | Admitting: Family Medicine

## 2020-10-22 NOTE — Telephone Encounter (Signed)
Patient calling Would like to know if she should be taking both atorvastatin 40 MG (Dr Caryl Comes prescribed) and furosemide 40 MG (Dr End prescribed) Please call to discuss

## 2020-10-22 NOTE — Telephone Encounter (Signed)
Name of Medication: Ambien Name of Pharmacy: [facility] or Written Date and Quantity:  Last Office Visit and Type: CPE on 04/27/20 Next Office Visit and Type: none scheduled

## 2020-10-22 NOTE — Telephone Encounter (Signed)
Spoke to pt.  Verified that she is to be taking both Atorvastatin 40 mg daily and Lasix 40 mg daily AS NEEDED for edema / wt gain. Pt voiced understanding.   Pt proceeded to state she was looking over her medications to determine if any may be the cause of her recent decr energy. Pt states her energy has significantly dropped the last couple weeks. No other s/s to report. Denies chest pain, shortness of breath, or any other associated s/s.  Pt is going to discuss with PCP first and will contact our office if they think cardiology needs to see re symptoms.  Pt has no further questions at this time.

## 2020-10-22 NOTE — Telephone Encounter (Signed)
Attempted to call pt x 2 and phone hung up both times.  Will try to reach pt at another time.

## 2020-10-24 ENCOUNTER — Other Ambulatory Visit: Payer: Self-pay

## 2020-10-24 ENCOUNTER — Encounter: Payer: Self-pay | Admitting: Family Medicine

## 2020-10-24 ENCOUNTER — Ambulatory Visit (INDEPENDENT_AMBULATORY_CARE_PROVIDER_SITE_OTHER): Payer: Medicare Other | Admitting: Family Medicine

## 2020-10-24 VITALS — BP 122/68 | HR 73 | Temp 97.2°F | Ht 68.5 in | Wt 194.5 lb

## 2020-10-24 DIAGNOSIS — I2510 Atherosclerotic heart disease of native coronary artery without angina pectoris: Secondary | ICD-10-CM | POA: Diagnosis not present

## 2020-10-24 DIAGNOSIS — Z79899 Other long term (current) drug therapy: Secondary | ICD-10-CM | POA: Insufficient documentation

## 2020-10-24 DIAGNOSIS — G4730 Sleep apnea, unspecified: Secondary | ICD-10-CM | POA: Insufficient documentation

## 2020-10-24 DIAGNOSIS — R7309 Other abnormal glucose: Secondary | ICD-10-CM

## 2020-10-24 DIAGNOSIS — R198 Other specified symptoms and signs involving the digestive system and abdomen: Secondary | ICD-10-CM | POA: Diagnosis not present

## 2020-10-24 DIAGNOSIS — R5382 Chronic fatigue, unspecified: Secondary | ICD-10-CM

## 2020-10-24 DIAGNOSIS — Z23 Encounter for immunization: Secondary | ICD-10-CM

## 2020-10-24 DIAGNOSIS — M810 Age-related osteoporosis without current pathological fracture: Secondary | ICD-10-CM | POA: Diagnosis not present

## 2020-10-24 DIAGNOSIS — R0989 Other specified symptoms and signs involving the circulatory and respiratory systems: Secondary | ICD-10-CM

## 2020-10-24 DIAGNOSIS — R5383 Other fatigue: Secondary | ICD-10-CM | POA: Insufficient documentation

## 2020-10-24 LAB — TSH: TSH: 1.58 u[IU]/mL (ref 0.35–5.50)

## 2020-10-24 LAB — COMPREHENSIVE METABOLIC PANEL
ALT: 19 U/L (ref 0–35)
AST: 19 U/L (ref 0–37)
Albumin: 4.3 g/dL (ref 3.5–5.2)
Alkaline Phosphatase: 85 U/L (ref 39–117)
BUN: 24 mg/dL — ABNORMAL HIGH (ref 6–23)
CO2: 24 mEq/L (ref 19–32)
Calcium: 10.1 mg/dL (ref 8.4–10.5)
Chloride: 103 mEq/L (ref 96–112)
Creatinine, Ser: 1.14 mg/dL (ref 0.40–1.20)
GFR: 48.18 mL/min — ABNORMAL LOW (ref >60.00)
Glucose, Bld: 77 mg/dL (ref 70–99)
Potassium: 4.4 mEq/L (ref 3.5–5.1)
Sodium: 135 mEq/L (ref 135–145)
Total Bilirubin: 0.8 mg/dL (ref 0.2–1.2)
Total Protein: 7.3 g/dL (ref 6.0–8.3)

## 2020-10-24 LAB — CBC WITH DIFFERENTIAL/PLATELET
Basophils Absolute: 0 10*3/uL (ref 0.0–0.1)
Basophils Relative: 0.8 % (ref 0.0–3.0)
Eosinophils Absolute: 0.2 10*3/uL (ref 0.0–0.7)
Eosinophils Relative: 4.3 % (ref 0.0–5.0)
HCT: 38.4 % (ref 36.0–46.0)
Hemoglobin: 12.9 g/dL (ref 12.0–15.0)
Lymphocytes Relative: 17.6 % (ref 12.0–46.0)
Lymphs Abs: 1 10*3/uL (ref 0.7–4.0)
MCHC: 33.6 g/dL (ref 30.0–36.0)
MCV: 89.4 fl (ref 78.0–100.0)
Monocytes Absolute: 0.5 10*3/uL (ref 0.1–1.0)
Monocytes Relative: 9.5 % (ref 3.0–12.0)
Neutro Abs: 3.8 10*3/uL (ref 1.4–7.7)
Neutrophils Relative %: 67.8 % (ref 43.0–77.0)
Platelets: 280 10*3/uL (ref 150.0–400.0)
RBC: 4.3 Mil/uL (ref 3.87–5.11)
RDW: 13.6 % (ref 11.5–15.5)
WBC: 5.6 10*3/uL (ref 4.0–10.5)

## 2020-10-24 LAB — VITAMIN B12: Vitamin B-12: 445 pg/mL (ref 211–911)

## 2020-10-24 LAB — HEMOGLOBIN A1C: Hgb A1c MFr Bld: 6 % (ref 4.6–6.5)

## 2020-10-24 LAB — T4, FREE: Free T4: 0.71 ng/dL (ref 0.60–1.60)

## 2020-10-24 LAB — IRON: Iron: 65 ug/dL (ref 42–145)

## 2020-10-24 LAB — VITAMIN D 25 HYDROXY (VIT D DEFICIENCY, FRACTURES): VITD: 41.31 ng/mL (ref 30.00–100.00)

## 2020-10-24 NOTE — Assessment & Plan Note (Signed)
B12 and D and iron ordered  Taking omeprazole 40 mg daily

## 2020-10-24 NOTE — Progress Notes (Signed)
Subjective:    Patient ID: Diana Cook, female    DOB: [birth year], 70 y.o.   MRN: [id number]  This visit occurred during the SARS-CoV-2 public health emergency.  Safety protocols were in place, including screening questions prior to the visit, additional usage of staff PPE, and extensive cleaning of exam room while observing appropriate contact time as indicated for disinfecting solutions.   HPI Pt presents for c/o fatigue  History of COPD and complete heart block in the past as well ast grade 1 DD  Wt Readings from Last 3 Encounters:  10/24/20 194 lb 8 oz (88.2 kg)  09/24/20 195 lb (88.5 kg)  08/21/20 193 lb (87.5 kg)   29.14 kg/m  For 2-3 weeks feels really tired  Went through her medications  Checked in with cardiology (takes lasix)  Just wants to sit   COPD- ? If she needs to start needing more 02  Uses it at night  Pulse ox today is 93% More hoarse lately  Has gerd-takes omeprazole (saw ENT Dr Richardson Landry and started that after laryngoscope which showed evidence of GERD)   Gains weight when she does not exercise  Tries to walk regularly (even walks with walker when unsteady) and that helps a little bit   Sleep -fair  Sleep apnea in the past - did not tolerate cpap  Appetite - just fine  She has IBS- back and forth, more constipation lately    Has chronic breast pain from her cancer (sees Dr Tollie Pizza and he is aware) Back and neck also chronic   Mood Always in grief from loss of daughter Takes fluoxetine        Had poison ivy early this mo and was treated with solu medrol at [facility]   BP Readings from Last 3 Encounters:  10/24/20 122/68  10/01/20 111/69  09/24/20 118/74   Pulse Readings from Last 3 Encounters:  10/24/20 73  10/01/20 71  09/24/20 79     Most recent labs Lab Results  Component Value Date   WBC 9.1 03/09/2020   HGB 13.8 03/09/2020   HCT 40.4 03/09/2020   MCV 88.6 03/09/2020   PLT 318.0 03/09/2020   Lab Results  Component Value  Date   CREATININE 1.06 03/09/2020   BUN 32 (H) 03/09/2020   NA 134 (L) 03/09/2020   K 5.1 03/09/2020   CL 99 03/09/2020   CO2 28 03/09/2020   Lab Results  Component Value Date   TSH 1.02 12/08/2017   Lab Results  Component Value Date   HGBA1C 5.8 03/09/2020    Patient Active Problem List   Diagnosis Date Noted   Current use of proton pump inhibitor 10/24/2020   Sleep apnea 10/24/2020   Fatigue 10/24/2020   Orthostatic lightheadedness 05/16/2020   Radicular pain in left arm 03/02/2020   Neck pain on left side 02/15/2020   Paresthesia 02/15/2020   Chronic respiratory failure with hypoxia ([place]) 11/17/2019   Chronic heart failure with preserved ejection fraction (HFpEF) ([place]) 08/11/2019   Coronary artery disease involving native coronary artery of native heart without angina pectoris 06/20/2019   Gastroesophageal reflux disease    Stomach irritation    Abnormal CT scan, esophagus    Polyp of ascending colon    Personal history of tobacco use, presenting hazards to health 01/18/2018   Grade I diastolic dysfunction 40/98/1191   Globus pharyngeus 12/21/2017   Chronic obstructive pulmonary disease ([place]) 12/15/2017   Hyperlipidemia LDL goal <70 12/07/2017   Seizures ([place])  10/01/2016   Elevated glucose 08/31/2016   Grief reaction 04/11/2016   Malignant neoplasm of upper-outer quadrant of right breast in female, estrogen receptor positive ([place]) 12/11/2015   Osteoporosis 11/22/2015   Estrogen deficiency 09/07/2015   Bruising 09/07/2015   Wrinkles 09/07/2015   Post herpetic neuralgia 09/01/2014   Encounter for Medicare annual wellness exam 07/11/2014   Routine general medical examination at a health care facility 06/26/2013   H/O small bowel obstruction 04/15/2013   Dyspnea on exertion 02/10/2013   Complete heart block ([place]) 11/03/2011   Pacemaker-[employer] 11/03/2011   Lumbar spinal stenosis 09/02/2011   Shortness of breath 10/30/2010   POSTMENOPAUSAL STATUS  08/06/2009   HIDRADENITIS SUPPURATIVA 06/26/2008   HERNIATED DISC 12/14/2007   BACK, LOWER, PAIN 12/02/2007   HX, PERSONAL, COLONIC POLYPS 09/14/2006   Former smoker 06/10/2006   ASTHMA 06/10/2006   H/O idiopathic seizure 06/10/2006   Past Medical History:  Diagnosis Date   Allergic rhinitis    Arthritis    Asthma    as a child, mild now   Breast cancer ([place]) 12/2015   right breast cancer, lumpectomy and mammosite    Colon polyps    colonoscopy 7/08, tubular adenoma   Complete heart block ([place]) 8/13   s/p PPM implanted in [facility]   COPD (chronic obstructive pulmonary disease) ([place])    Myocardial infarction ([place]) 2011   Osteopenia 10/2015   Pacemaker    2011   Personal history of radiation therapy 2017   right breast ca, mammosite placed   Seizure disorder ([place])    Seizures ([place])    first one was when she was 70 years old    Small bowel obstruction ([place])    1988 and 2002   Tobacco abuse    Past Surgical History:  Procedure Laterality Date   ABDOMINAL HYSTERECTOMY     BREAST BIOPSY Right 2007   benign inflammatory changes, mass due to underwire bra   BREAST BIOPSY Left 01/02/2016   columnar cell changes without atypical hyperplasia.   BREAST BIOPSY Right 12/06/2015   rt breast mass 10:00, bx done at Dr. Curly [place] office, invasive ductal carcinoma   BREAST EXCISIONAL BIOPSY Left 01/02/2016   COLUMNAR CELL CHANGE AND HYPERPLASIA ASSOCIATED WITH LUMINAL AND STROMAL CALCIFICATIONS   BREAST LUMPECTOMY Right 01/02/2016   invasive mammary carcinoma, clear margins, negative LN   BREAST LUMPECTOMY WITH SENTINEL LYMPH NODE BIOPSY Right 01/02/2016   pT1c, N0; ER/ PR 100%; Her 2 neu not over expressed: BREAST LUMPECTOMY WITH SENTINEL LYMPH NODE BX;  Surgeon: [name], MD;  Location: [facility] ORS;  Service: General;  Laterality: Right;   CARDIAC CATHETERIZATION     CATARACT EXTRACTION Bilateral    COLONOSCOPY  10/2015   Dr Ardis Hughs   COLONOSCOPY WITH PROPOFOL N/A  08/19/2018   Procedure: COLONOSCOPY WITH PROPOFOL;  Surgeon: Virgel Manifold, MD;  Location: [facility] ENDOSCOPY;  Service: Endoscopy;  Laterality: N/A;   ESOPHAGOGASTRODUODENOSCOPY (EGD) WITH PROPOFOL N/A 08/19/2018   Procedure: ESOPHAGOGASTRODUODENOSCOPY (EGD) WITH PROPOFOL;  Surgeon: Virgel Manifold, MD;  Location: [facility] ENDOSCOPY;  Service: Endoscopy;  Laterality: N/A;   EXPLORATORY LAPAROTOMY  01/25/2001   Exploratory laparotomy, lysis of adhesions, identification of internal hernia secondary to omental adhesion. Prolonged postoperative ileus.   gyn surgery  1993   hysterectomy- form endometriosis   LAPAROSCOPY     PACEMAKER INSERTION  10/24/11   [employer] Advantio dual chamber PPM implanted by Dr Bunnie Philips at [facility] in [place]  PPM GENERATOR CHANGEOUT N/A 12/05/2019   Procedure: PPM GENERATOR CHANGEOUT;  Surgeon: Deboraha Sprang, MD;  Location: [facility];  Service: Cardiovascular;  Laterality: N/A;   RIGHT/LEFT HEART CATH AND CORONARY ANGIOGRAPHY Bilateral 07/19/2019   Procedure: RIGHT/LEFT HEART CATH AND CORONARY ANGIOGRAPHY;  Surgeon: Nelva Bush, MD;  Location: [place] [facility];  Service: Cardiovascular;  Laterality: Bilateral;   TEMPORARY PACEMAKER N/A 12/05/2019   Procedure: TEMPORARY PACEMAKER;  Surgeon: Deboraha Sprang, MD;  Location: [place] [facility];  Service: Cardiovascular;  Laterality: N/A;   Social History   Tobacco Use   Smoking status: Former    Packs/day: 1.00    Years: 42.00    Pack years: 42.00    Types: Cigarettes    Quit date: 11/11/2010    Years since quitting: 9.9   Smokeless tobacco: Never  Vaping Use   Vaping Use: Never used  Substance Use Topics   Alcohol use: Yes    Alcohol/week: 4.0 standard drinks    Types: 4 Glasses of wine per week    Comment: weekly   Drug use: No   Family History  Problem Relation Age of Onset   Stroke Mother    Heart disease Mother 71       MI and CABG   Dementia  Mother    Coronary artery disease Father    Parkinsonism Father    Cancer - Cervical Daughter 74       died 03-15-22   Heart attack Brother 18   Colon cancer Neg Hx    Breast cancer Neg Hx    Allergies  Allergen Reactions   Codeine Nausea And Vomiting   Morphine And Related Nausea Only   Current Outpatient Medications on File Prior to Visit  Medication Sig Dispense Refill   acetaminophen (TYLENOL) 500 MG tablet Take 500-1,000 mg by mouth every 6 (six) hours as needed for mild pain or headache.      albuterol (VENTOLIN HFA) 108 (90 Base) MCG/ACT inhaler Inhale 2 puffs into the lungs every 4 (four) hours as needed for wheezing or shortness of breath. 18 g 1   aspirin EC 81 MG tablet Take 81 mg by mouth daily. Swallow whole.     atorvastatin (LIPITOR) 40 MG tablet Take 1 tablet by mouth once daily 90 tablet 0   Calcium Carb-Cholecalciferol (CALCIUM 600 + D PO) Take 1 tablet by mouth 2 (two) times daily.     FLUoxetine (PROZAC) 20 MG capsule Take 1 capsule by mouth once daily 90 capsule 0   furosemide (LASIX) 40 MG tablet Take 1 tablet (40 mg total) by mouth daily as needed for edema (For weight gain or swelling.). 90 tablet 3   levETIRAcetam (KEPPRA) 250 MG tablet Take 250 mg by mouth 2 (two) times daily.      omeprazole (PRILOSEC) 40 MG capsule Take 40 mg by mouth daily.     OXYGEN Inhale 3 L into the lungs at bedtime.      STIOLTO RESPIMAT 2.5-2.5 MCG/ACT AERS INHALE 2 PUFFS BY MOUTH ONCE DAILY 4 g 2   tretinoin (RETIN-A) 0.1 % cream Apply topically at bedtime. 45 g 1   zolpidem (AMBIEN) 10 MG tablet TAKE 1/2 TO 1 (ONE-HALF TO ONE) TABLET BY MOUTH AT BEDTIME AS NEEDED FOR SLEEP 30 tablet 3   No current facility-administered medications on file prior to visit.    Review of Systems  Constitutional:  Positive for fatigue. Negative for activity change, appetite change, fever and  unexpected weight change.  HENT:  Negative for congestion, ear pain, rhinorrhea, sinus pressure and sore throat.    Eyes:  Negative for pain, redness and visual disturbance.  Respiratory:  Negative for cough, shortness of breath and wheezing.        Snoring  Cardiovascular:  Negative for chest pain and palpitations.  Gastrointestinal:  Negative for abdominal pain, blood in stool, constipation and diarrhea.  Endocrine: Negative for polydipsia and polyuria.  Genitourinary:  Negative for dysuria, frequency and urgency.  Musculoskeletal:  Negative for arthralgias, back pain and myalgias.  Skin:  Negative for pallor and rash.  Allergic/Immunologic: Negative for environmental allergies.  Neurological:  Negative for dizziness, syncope and headaches.  Hematological:  Negative for adenopathy. Does not bruise/bleed easily.       Stable mood  Psychiatric/Behavioral:  Positive for sleep disturbance. Negative for decreased concentration, dysphoric mood and suicidal ideas. The patient is not nervous/anxious.       Objective:   Physical Exam Constitutional:      General: She is not in acute distress.    Appearance: Normal appearance. She is well-developed.     Comments: overwt  HENT:     Head: Normocephalic and atraumatic.     Right Ear: Tympanic membrane and ear canal normal.     Left Ear: Tympanic membrane and ear canal normal.     Mouth/Throat:     Mouth: Mucous membranes are moist.  Eyes:     General: No scleral icterus.       Right eye: No discharge.        Left eye: No discharge.     Conjunctiva/sclera: Conjunctivae normal.     Pupils: Pupils are equal, round, and reactive to light.  Neck:     Thyroid: No thyromegaly.     Vascular: No carotid bruit or JVD.  Cardiovascular:     Rate and Rhythm: Normal rate and regular rhythm.     Heart sounds: Normal heart sounds.    No gallop.  Pulmonary:     Effort: Pulmonary effort is normal. No respiratory distress.     Breath sounds: Normal breath sounds. No wheezing or rales.  Abdominal:     General: Bowel sounds are normal. There is no distension or  abdominal bruit.     Palpations: Abdomen is soft. There is no mass.     Tenderness: There is no abdominal tenderness.  Musculoskeletal:     Cervical back: Normal range of motion and neck supple. No tenderness.     Right lower leg: No edema.     Left lower leg: No edema.  Lymphadenopathy:     Cervical: No cervical adenopathy.  Skin:    General: Skin is warm and dry.     Coloration: Skin is not jaundiced or pale.     Findings: No bruising or rash.  Neurological:     Mental Status: She is alert.     Coordination: Coordination normal.     Deep Tendon Reflexes: Reflexes are normal and symmetric. Reflexes normal.  Psychiatric:        Mood and Affect: Mood normal.        Cognition and Memory: Cognition and memory normal.          Assessment & Plan:   Problem List Items Addressed This Visit       Respiratory   Sleep apnea    Per pt -very fatigued and non restorative sleep Did not tolerate cpap in the past  Would be worth  f/u with pulm and/or ENT to discuss other options (like the inspire device)         Musculoskeletal and Integument   Osteoporosis    Vit D level today        Other   Elevated glucose    With fatigue  Diet is stable  a1c ordered      Relevant Orders   Hemoglobin A1c   Globus pharyngeus    GERD seen by ENT on laryngoscope  Dr Richardson Landry On omeprazole 40 mg       Current use of proton pump inhibitor    B12 and D and iron ordered  Taking omeprazole 40 mg daily      Relevant Orders   Vitamin B12   Iron   VITAMIN D 25 Hydroxy (Vit-D Deficiency, Fractures)   Fatigue - Primary    Suspect multifactorial Labs today  Disc poss sleep apnea as well  Mood has been stable   Lab Orders     CBC with Differential/Platelet     Comprehensive metabolic panel     Hemoglobin A1c     TSH     Vitamin B12     T4, free     Iron     VITAMIN D 25 Hydroxy (Vit-D Deficiency, Fractures)       Relevant Orders   CBC with Differential/Platelet    Comprehensive metabolic panel   TSH   T4, free   Iron   VITAMIN D 25 Hydroxy (Vit-D Deficiency, Fractures)   Other Visit Diagnoses     Need for influenza vaccination       Relevant Orders   Flu Vaccine QUAD High Dose(Fluad) (Completed)

## 2020-10-24 NOTE — Assessment & Plan Note (Signed)
With fatigue  Diet is stable  a1c ordered

## 2020-10-24 NOTE — Patient Instructions (Addendum)
Let's get labs   Talk to your pulmonologist and/or ENT re: the Inspire device for sleep apnea since you cannot tolerate cpap Sleep apnea could be your problem   Take care of yourself  Keep up good fluid intake

## 2020-10-24 NOTE — Assessment & Plan Note (Signed)
Per pt -very fatigued and non restorative sleep Did not tolerate cpap in the past  Would be worth f/u with pulm and/or ENT to discuss other options (like the inspire device)

## 2020-10-24 NOTE — Assessment & Plan Note (Signed)
Vit D level today 

## 2020-10-24 NOTE — Assessment & Plan Note (Signed)
Suspect multifactorial Labs today  Disc poss sleep apnea as well  Mood has been stable   Lab Orders     CBC with Differential/Platelet     Comprehensive metabolic panel     Hemoglobin A1c     TSH     Vitamin B12     T4, free     Iron     VITAMIN D 25 Hydroxy (Vit-D Deficiency, Fractures)

## 2020-10-24 NOTE — Assessment & Plan Note (Signed)
GERD seen by ENT on laryngoscope  Dr Richardson Landry On omeprazole 40 mg

## 2020-11-25 DIAGNOSIS — U071 COVID-19: Secondary | ICD-10-CM | POA: Diagnosis not present

## 2020-11-27 ENCOUNTER — Other Ambulatory Visit: Payer: Self-pay | Admitting: General Surgery

## 2020-11-27 ENCOUNTER — Other Ambulatory Visit: Payer: Self-pay | Admitting: Family Medicine

## 2020-11-27 DIAGNOSIS — Z1231 Encounter for screening mammogram for malignant neoplasm of breast: Secondary | ICD-10-CM

## 2020-11-30 DIAGNOSIS — K219 Gastro-esophageal reflux disease without esophagitis: Secondary | ICD-10-CM | POA: Diagnosis not present

## 2020-11-30 DIAGNOSIS — J381 Polyp of vocal cord and larynx: Secondary | ICD-10-CM | POA: Diagnosis not present

## 2020-12-06 ENCOUNTER — Ambulatory Visit (INDEPENDENT_AMBULATORY_CARE_PROVIDER_SITE_OTHER): Payer: Medicare Other

## 2020-12-06 ENCOUNTER — Other Ambulatory Visit: Payer: Self-pay | Admitting: Internal Medicine

## 2020-12-06 DIAGNOSIS — I442 Atrioventricular block, complete: Secondary | ICD-10-CM | POA: Diagnosis not present

## 2020-12-06 LAB — CUP PACEART REMOTE DEVICE CHECK
Battery Remaining Longevity: 156 mo
Battery Remaining Percentage: 100 %
Brady Statistic RA Percent Paced: 3 %
Brady Statistic RV Percent Paced: 100 %
Date Time Interrogation Session: 20221013011200
Implantable Lead Implant Date: 20130830
Implantable Lead Implant Date: 20130830
Implantable Lead Location: 753859
Implantable Lead Location: 753860
Implantable Lead Model: 4456
Implantable Lead Model: 4479
Implantable Lead Serial Number: 473325
Implantable Lead Serial Number: 523784
Implantable Pulse Generator Implant Date: 20211011
Lead Channel Impedance Value: 441 Ohm
Lead Channel Impedance Value: 630 Ohm
Lead Channel Pacing Threshold Amplitude: 0.4 V
Lead Channel Pacing Threshold Pulse Width: 0.4 ms
Lead Channel Setting Pacing Amplitude: 2 V
Lead Channel Setting Pacing Amplitude: 2 V
Lead Channel Setting Pacing Pulse Width: 0.4 ms
Lead Channel Setting Sensing Sensitivity: 2.5 mV
Pulse Gen Serial Number: 949303

## 2020-12-14 NOTE — Progress Notes (Signed)
Remote pacemaker transmission.   

## 2020-12-18 ENCOUNTER — Other Ambulatory Visit: Payer: Self-pay | Admitting: Pulmonary Disease

## 2020-12-25 ENCOUNTER — Ambulatory Visit (INDEPENDENT_AMBULATORY_CARE_PROVIDER_SITE_OTHER): Payer: Medicare Other | Admitting: Pulmonary Disease

## 2020-12-25 ENCOUNTER — Other Ambulatory Visit: Payer: Self-pay

## 2020-12-25 ENCOUNTER — Encounter: Payer: Self-pay | Admitting: Pulmonary Disease

## 2020-12-25 VITALS — BP 100/60 | HR 87 | Temp 97.1°F | Ht 68.5 in | Wt 197.4 lb

## 2020-12-25 DIAGNOSIS — J432 Centrilobular emphysema: Secondary | ICD-10-CM | POA: Diagnosis not present

## 2020-12-25 DIAGNOSIS — R911 Solitary pulmonary nodule: Secondary | ICD-10-CM

## 2020-12-25 DIAGNOSIS — J9611 Chronic respiratory failure with hypoxia: Secondary | ICD-10-CM | POA: Diagnosis not present

## 2020-12-25 DIAGNOSIS — J382 Nodules of vocal cords: Secondary | ICD-10-CM | POA: Diagnosis not present

## 2020-12-25 MED ORDER — STIOLTO RESPIMAT 2.5-2.5 MCG/ACT IN AERS: 2.0000 | INHALATION_SPRAY | Freq: Every day | RESPIRATORY_TRACT | 0 refills | Status: DC

## 2020-12-25 NOTE — Patient Instructions (Addendum)
You qualify for oxygen today.  I recommend that he use 2 L/min with activity.  You may be off of oxygen with doing quiet things like reading, watching TV etc.  Continue wearing her oxygen at nighttime.  We will see you in follow-up in 3 months time call sooner should any new problems arise.

## 2020-12-25 NOTE — Progress Notes (Signed)
Subjective:    Patient ID: Diana Cook, female    DOB: 12-28-1950, 70 y.o.   MRN: [id number] Chief Complaint  Patient presents with   Follow-up    Emphysema- COPD, MORE shortness of breath.    HPI This is a 70 year old former smoker (quit 2012, 42 PY) who follows here for the issue of dyspnea on exertion in the setting of emphysema with preserved FEV1 and nocturnal hypoxemia related to emphysema.  She is compliant with oxygen nocturnally at 3-1/2 L/minutes.  She notes that she sleeps well with this and feels refreshed in the morning.  She feels also less fatigue during the day when she uses it.  She has however noted increasing issues with dyspnea on exertion.  Recall that she has qualified previously for O2 with activity however she has been reluctant to use it.  She has a history of complete heart block and is status post pacemaker placement.  Had pacer revision in October 2021 by Dr. Caryl Comes.  She continues to use Stiolto and as needed albuterol.  Albuterol use is very rare.  She does not endorse any chest pain, no fevers, chills or sweats.  No calf tenderness or edema.  No sleep disturbance.    She has noted some hoarseness she does not seem to clear.  She is not on inhaled corticosteroids.  He has seen ENT and has noted to have a vocal cord polyp and it has been recommended that she should have surgery on this.  Has not set this up as of yet.  She does not endorse any other symptomatology.   Review of Systems A 10 point review of systems was performed and it is as noted above otherwise negative.  Patient Active Problem List   Diagnosis Date Noted   Current use of proton pump inhibitor 10/24/2020   Sleep apnea 10/24/2020   Fatigue 10/24/2020   Orthostatic lightheadedness 05/16/2020   Radicular pain in left arm 03/02/2020   Neck pain on left side 02/15/2020   Paresthesia 02/15/2020   Chronic respiratory failure with hypoxia ([place]) 11/17/2019   Chronic heart failure with preserved  ejection fraction (HFpEF) ([place]) 08/11/2019   Coronary artery disease involving native coronary artery of native heart without angina pectoris 06/20/2019   Gastroesophageal reflux disease    Stomach irritation    Abnormal CT scan, esophagus    Polyp of ascending colon    Personal history of tobacco use, presenting hazards to health 01/18/2018   Grade I diastolic dysfunction 67/67/2094   Globus pharyngeus 12/21/2017   Chronic obstructive pulmonary disease ([place]) 12/15/2017   Hyperlipidemia LDL goal <70 12/07/2017   Seizures ([place]) 10/01/2016   Elevated glucose 08/31/2016   Grief reaction 04/11/2016   Malignant neoplasm of upper-outer quadrant of right breast in female, estrogen receptor positive ([place]) 12/11/2015   Osteoporosis 11/22/2015   Estrogen deficiency 09/07/2015   Bruising 09/07/2015   Wrinkles 09/07/2015   Post herpetic neuralgia 09/01/2014   Encounter for Medicare annual wellness exam 07/11/2014   Routine general medical examination at a health care facility 06/26/2013   H/O small bowel obstruction 04/15/2013   Dyspnea on exertion 02/10/2013   Complete heart block ([place]) 11/03/2011   Pacemaker-[employer] 11/03/2011   Lumbar spinal stenosis 09/02/2011   Shortness of breath 10/30/2010   POSTMENOPAUSAL STATUS 08/06/2009   HIDRADENITIS SUPPURATIVA 06/26/2008   HERNIATED DISC 12/14/2007   BACK, LOWER, PAIN 12/02/2007   HX, PERSONAL, COLONIC POLYPS 09/14/2006   Former smoker 06/10/2006   ASTHMA 06/10/2006  H/O idiopathic seizure 06/10/2006   Social History   Tobacco Use   Smoking status: Former    Packs/day: 1.50    Years: 42.00    Pack years: 63.00    Types: Cigarettes    Quit date: 11/11/2010    Years since quitting: 10.1   Smokeless tobacco: Never  Substance Use Topics   Alcohol use: Yes    Alcohol/week: 4.0 standard drinks    Types: 4 Glasses of wine per week    Comment: weekly   Allergies  Allergen Reactions   Codeine Nausea And Vomiting   Morphine  And Related Nausea Only   Current Meds  Medication Sig   acetaminophen (TYLENOL) 500 MG tablet Take 500-1,000 mg by mouth every 6 (six) hours as needed for mild pain or headache.    albuterol (VENTOLIN HFA) 108 (90 Base) MCG/ACT inhaler Inhale 2 puffs into the lungs every 4 (four) hours as needed for wheezing or shortness of breath.   aspirin EC 81 MG tablet Take 81 mg by mouth daily. Swallow whole.   atorvastatin (LIPITOR) 40 MG tablet Take 1 tablet by mouth once daily   Calcium Carb-Cholecalciferol (CALCIUM 600 + D PO) Take 1 tablet by mouth 2 (two) times daily.   FLUoxetine (PROZAC) 20 MG capsule Take 1 capsule by mouth once daily   furosemide (LASIX) 40 MG tablet Take 1 tablet (40 mg total) by mouth daily as needed.   levETIRAcetam (KEPPRA) 250 MG tablet Take 250 mg by mouth 2 (two) times daily.    omeprazole (PRILOSEC) 40 MG capsule Take 40 mg by mouth daily.   OXYGEN Inhale 3 L into the lungs at bedtime.    STIOLTO RESPIMAT 2.5-2.5 MCG/ACT AERS INHALE 2 PUFFS BY MOUTH ONCE DAILY   Tiotropium Bromide-Olodaterol (STIOLTO RESPIMAT) 2.5-2.5 MCG/ACT AERS Inhale 2 puffs into the lungs daily.   tretinoin (RETIN-A) 0.1 % cream Apply topically at bedtime.   zolpidem (AMBIEN) 10 MG tablet TAKE 1/2 TO 1 (ONE-HALF TO ONE) TABLET BY MOUTH AT BEDTIME AS NEEDED FOR SLEEP   Immunization History  Administered Date(s) Administered   Fluad Quad(high Dose 65+) 10/24/2020   Influenza Whole 02/24/2005   Influenza, High Dose Seasonal PF 11/04/2018, 11/22/2019   Influenza,inj,Quad PF,6+ Mos 12/03/2012, 12/09/2013, 01/16/2015, 02/06/2016, 11/04/2016, 12/08/2017   PFIZER Comirnaty(Gray Top)Covid-19 Tri-Sucrose Vaccine 07/06/2020   PFIZER(Purple Top)SARS-COV-2 Vaccination 02/28/2019, 03/21/2019, 11/14/2019   Pneumococcal Conjugate-13 07/11/2014   Pneumococcal Polysaccharide-23 03/21/2005, 07/26/2015   Td 01/09/1999, 06/14/2007   Zoster, Live 09/07/2015       Objective:   Physical Exam BP 100/60 (BP  Location: Left Arm, Patient Position: Sitting, Cuff Size: Normal)   Pulse 87   Temp (!) 97.1 F (36.2 C) (Oral)   Ht 5' 8.5" (1.74 m)   Wt 197 lb 6.4 oz (89.5 kg)   SpO2 90%   BMI 29.58 kg/m  GENERAL: Well-developed well-nourished woman in no acute respiratory distress.  She is fully ambulatory.  No conversational dyspnea.  Does sound hoarse. HEAD: Normocephalic, atraumatic. EYES: Pupils equal, round, reactive to light.  No scleral icterus. MOUTH: Nose/mouth/throat not examined due to masking requirements for COVID 19. NECK: Supple. No thyromegaly. No nodules. No JVD.  Trachea midline. PULMONARY: Symmetrical air entry, clear with no adventitious sounds CARDIOVASCULAR: S1 and S2. Regular rate and rhythm. No rubs murmurs gallops heard. GASTROINTESTINAL: Nondistended. MUSCULOSKELETAL: No joint deformity, no clubbing, no edema. NEUROLOGIC: Awake, alert, oriented x4, no focal deficits. SKIN: Intact,warm,dry, on limited exam no rashes. PSYCH: Normal mood and  behavior.   Ambulatory oximetry performed today: At baseline she was 90%.  She dropped to 87% with ambulating 500 feet.  On 2 L/min nasal cannula O2 she maintained at over 90%.  Dyspnea markedly improved.    Assessment & Plan:     ICD-10-CM   1. Centrilobular emphysema ([place])  J43.2 AMB REFERRAL FOR DME   Continue Stiolto and as needed albuterol Follow-up 3 months    2. Chronic respiratory failure with hypoxia ([facility])  J96.11    She has qualified yet again for supplemental oxygen with ambulation She is now willing to use O2 with ambulation Continue nocturnal oxygen as prior    3. 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  Oxygen for daytime has been ordered.  We will see the patient in follow-up in 3 months time contact us prior to that time should any new difficulties arise.  Renold Don, MD Advanced Bronchoscopy PCCM [facility]    *This note was dictated using voice recognition software/Dragon.  Despite best efforts to proofread, errors can occur which can change the meaning.  Any change was purely unintentional.

## 2020-12-27 ENCOUNTER — Encounter: Payer: Self-pay | Admitting: Pulmonary Disease

## 2020-12-27 ENCOUNTER — Ambulatory Visit
Admission: RE | Admit: 2020-12-27 | Discharge: 2020-12-27 | Disposition: A | Discharge status: Home / Self Care | Payer: Medicare Other | Source: Ambulatory Visit | Attending: Family Medicine | Admitting: Family Medicine

## 2020-12-27 ENCOUNTER — Telehealth: Payer: Self-pay

## 2020-12-27 ENCOUNTER — Other Ambulatory Visit: Payer: Self-pay

## 2020-12-27 DIAGNOSIS — Z87891 Personal history of nicotine dependence: Secondary | ICD-10-CM

## 2020-12-27 DIAGNOSIS — Z1231 Encounter for screening mammogram for malignant neoplasm of breast: Secondary | ICD-10-CM | POA: Insufficient documentation

## 2020-12-27 NOTE — Telephone Encounter (Signed)
Per Dr. Veneda Melter-- schedule low dose CT. Next available appt. Patient was due for CT 12/13/20.  Langley Gauss, please schedule. Thanks.

## 2020-12-27 NOTE — Telephone Encounter (Signed)
Left message for pt to call to schedule lung screening CT.

## 2020-12-31 NOTE — Telephone Encounter (Signed)
Spoke with pt and scheduled f/u low dose lung screening Ct. Pt verbalized understanding. Nothing further needed.

## 2021-01-03 DIAGNOSIS — K219 Gastro-esophageal reflux disease without esophagitis: Secondary | ICD-10-CM | POA: Diagnosis not present

## 2021-01-03 DIAGNOSIS — Z853 Personal history of malignant neoplasm of breast: Secondary | ICD-10-CM | POA: Diagnosis not present

## 2021-01-03 DIAGNOSIS — J381 Polyp of vocal cord and larynx: Secondary | ICD-10-CM | POA: Diagnosis not present

## 2021-01-10 ENCOUNTER — Other Ambulatory Visit: Payer: Self-pay

## 2021-01-10 ENCOUNTER — Ambulatory Visit
Admission: RE | Admit: 2021-01-10 | Discharge: 2021-01-10 | Disposition: A | Discharge status: Home / Self Care | Payer: Medicare Other | Source: Ambulatory Visit | Attending: Acute Care | Admitting: Acute Care

## 2021-01-10 DIAGNOSIS — Z87891 Personal history of nicotine dependence: Secondary | ICD-10-CM | POA: Insufficient documentation

## 2021-01-10 DIAGNOSIS — J479 Bronchiectasis, uncomplicated: Secondary | ICD-10-CM | POA: Diagnosis not present

## 2021-01-10 DIAGNOSIS — J9811 Atelectasis: Secondary | ICD-10-CM | POA: Diagnosis not present

## 2021-01-10 DIAGNOSIS — J439 Emphysema, unspecified: Secondary | ICD-10-CM | POA: Diagnosis not present

## 2021-01-10 DIAGNOSIS — R911 Solitary pulmonary nodule: Secondary | ICD-10-CM | POA: Diagnosis not present

## 2021-01-10 DIAGNOSIS — I700 Atherosclerosis of aorta: Secondary | ICD-10-CM | POA: Diagnosis not present

## 2021-01-14 ENCOUNTER — Telehealth: Payer: Self-pay | Admitting: Acute Care

## 2021-01-14 DIAGNOSIS — R911 Solitary pulmonary nodule: Secondary | ICD-10-CM

## 2021-01-14 NOTE — Addendum Note (Signed)
Addended by: Claudette Head A on: 01/14/2021 04:33 PM   Modules accepted: Orders

## 2021-01-14 NOTE — Addendum Note (Signed)
Addended by: Claudette Head A on: 01/14/2021 03:58 PM   Modules accepted: Orders

## 2021-01-14 NOTE — Telephone Encounter (Signed)
Spoke with patient go ahead and schedule PET CT.

## 2021-01-14 NOTE — Telephone Encounter (Signed)
PET ordered. Nothing further needed.

## 2021-01-14 NOTE — Telephone Encounter (Signed)
This is a FYI for the provider about the CT results.

## 2021-01-14 NOTE — Telephone Encounter (Signed)
Lung-RADS 2, benign appearance or behavior. Continue annual screening with low-dose chest CT without contrast in 12 months. Although, by strict lung cancer screening CT criteria, lesion is technically categorized as benign, when compared with exams dating back to 01/18/2018 there has been slow growth and together with CT morphology, findings are worrisome for indolent adenocarcinoma. Lesion was not hypermetabolic on PET 47/34/0370 but was borderline in size. Consider surgical evaluation. These results will be called to the ordering clinician or representative by the Radiologist Assistant, and communication documented in the PACS or [employer]. 2. Basilar interstitial and peripheral ground-glass may be due in part to atelectasis. Difficult to exclude interstitial lung disease. 3. Possible 1.6 cm low-attenuation left thyroid nodule. Recommend thyroid ultrasound. (Ref: J Am Coll Radiol. 2015 Feb;12(2): 143-50). 4. Aortic atherosclerosis (ICD10-I70.0). Coronary artery calcification. 5.  Emphysema (ICD10-J43.9).  Probably needs PFT's to see if she is even a surgical candidate. Let me know what you want to do. Thanks

## 2021-01-14 NOTE — Telephone Encounter (Signed)
Thank you, she is my patient what I will do is probably get a PET/CT, she is not the best surgical candidate due to her oxygen requirements and significant emphysema, even though her FEV1 is not terrible.  I will probably get her to SBRT after PET.  I am including Diana Cook so that we do not lose track of her.  She has a follow-up appointment with me in February but we will get a PET before then.  I will call the patient later today to convey plan.

## 2021-01-25 DIAGNOSIS — R059 Cough, unspecified: Secondary | ICD-10-CM | POA: Diagnosis not present

## 2021-01-25 DIAGNOSIS — J381 Polyp of vocal cord and larynx: Secondary | ICD-10-CM | POA: Diagnosis not present

## 2021-01-25 DIAGNOSIS — K219 Gastro-esophageal reflux disease without esophagitis: Secondary | ICD-10-CM | POA: Diagnosis not present

## 2021-01-29 ENCOUNTER — Ambulatory Visit
Admission: RE | Admit: 2021-01-29 | Discharge: 2021-01-29 | Disposition: A | Discharge status: Home / Self Care | Payer: Medicare Other | Source: Ambulatory Visit | Attending: Pulmonary Disease | Admitting: Pulmonary Disease

## 2021-01-29 ENCOUNTER — Other Ambulatory Visit: Payer: Self-pay

## 2021-01-29 DIAGNOSIS — J439 Emphysema, unspecified: Secondary | ICD-10-CM | POA: Insufficient documentation

## 2021-01-29 DIAGNOSIS — I700 Atherosclerosis of aorta: Secondary | ICD-10-CM | POA: Diagnosis not present

## 2021-01-29 DIAGNOSIS — J432 Centrilobular emphysema: Secondary | ICD-10-CM | POA: Diagnosis not present

## 2021-01-29 DIAGNOSIS — I2510 Atherosclerotic heart disease of native coronary artery without angina pectoris: Secondary | ICD-10-CM | POA: Insufficient documentation

## 2021-01-29 DIAGNOSIS — J9811 Atelectasis: Secondary | ICD-10-CM | POA: Diagnosis not present

## 2021-01-29 DIAGNOSIS — D1771 Benign lipomatous neoplasm of kidney: Secondary | ICD-10-CM | POA: Diagnosis not present

## 2021-01-29 DIAGNOSIS — R911 Solitary pulmonary nodule: Secondary | ICD-10-CM | POA: Diagnosis not present

## 2021-01-29 LAB — GLUCOSE, CAPILLARY: Glucose-Capillary: 94 mg/dL (ref 70–99)

## 2021-01-29 MED ORDER — FLUDEOXYGLUCOSE F - 18 (FDG) INJECTION
10.2000 | Freq: Once | INTRAVENOUS | Status: AC | PRN
  Administered 2021-01-29: 10.5 via INTRAVENOUS

## 2021-01-30 ENCOUNTER — Other Ambulatory Visit: Payer: Self-pay

## 2021-01-30 DIAGNOSIS — R911 Solitary pulmonary nodule: Secondary | ICD-10-CM

## 2021-02-06 ENCOUNTER — Other Ambulatory Visit: Payer: Self-pay | Admitting: Internal Medicine

## 2021-02-19 ENCOUNTER — Other Ambulatory Visit: Payer: Self-pay | Admitting: Family Medicine

## 2021-02-19 NOTE — Telephone Encounter (Signed)
Last OV- 10/24/2020 Next OV- 01-16/2023 Last Filled- 10/22/2020

## 2021-02-28 DIAGNOSIS — J381 Polyp of vocal cord and larynx: Secondary | ICD-10-CM | POA: Diagnosis not present

## 2021-02-28 DIAGNOSIS — R490 Dysphonia: Secondary | ICD-10-CM | POA: Diagnosis not present

## 2021-03-07 ENCOUNTER — Ambulatory Visit (INDEPENDENT_AMBULATORY_CARE_PROVIDER_SITE_OTHER): Payer: Medicare Other

## 2021-03-07 DIAGNOSIS — I442 Atrioventricular block, complete: Secondary | ICD-10-CM

## 2021-03-07 LAB — CUP PACEART REMOTE DEVICE CHECK
Battery Remaining Longevity: 150 mo
Battery Remaining Percentage: 100 %
Brady Statistic RA Percent Paced: 3 %
Brady Statistic RV Percent Paced: 100 %
Date Time Interrogation Session: 20230112011000
Implantable Lead Implant Date: 20130830
Implantable Lead Implant Date: 20130830
Implantable Lead Location: 753859
Implantable Lead Location: 753860
Implantable Lead Model: 4456
Implantable Lead Model: 4479
Implantable Lead Serial Number: 473325
Implantable Lead Serial Number: 523784
Implantable Pulse Generator Implant Date: 20211011
Lead Channel Impedance Value: 414 Ohm
Lead Channel Impedance Value: 588 Ohm
Lead Channel Pacing Threshold Amplitude: 0.4 V
Lead Channel Pacing Threshold Pulse Width: 0.4 ms
Lead Channel Setting Pacing Amplitude: 2 V
Lead Channel Setting Pacing Amplitude: 2 V
Lead Channel Setting Pacing Pulse Width: 0.4 ms
Lead Channel Setting Sensing Sensitivity: 2.5 mV
Pulse Gen Serial Number: 949303

## 2021-03-08 ENCOUNTER — Other Ambulatory Visit: Payer: Self-pay | Admitting: Internal Medicine

## 2021-03-08 NOTE — Progress Notes (Signed)
Subjective:   Diana Cook is a 71 y.o. female who presents for Medicare Annual (Subsequent) preventive examination.  I connected with Elicia Lamp today by telephone and verified that I am speaking with the correct person using two identifiers. Location patient: home Location provider: work Persons participating in the virtual visit: patient, Marine scientist.    I discussed the limitations, risks, security and privacy concerns of performing an evaluation and management service by telephone and the availability of in person appointments. I also discussed with the patient that there may be a patient responsible charge related to this service. The patient expressed understanding and verbally consented to this telephonic visit.    Interactive audio and video telecommunications were attempted between this provider and patient, however failed, due to patient having technical difficulties OR patient did not have access to video capability.  We continued and completed visit with audio only.  Some vital signs may be absent or patient reported.   Time Spent with patient on telephone encounter: 25 minutes  Review of Systems     Cardiac Risk Factors include: advanced age (>8men, >59 women);dyslipidemia     Objective:    Today's Vitals   03/11/21 1158 03/11/21 1159  Weight: 195 lb (88.5 kg)   Height: 5\' 8"  (1.727 m)   PainSc:  6    Body mass index is 29.65 kg/m.  Advanced Directives 03/11/2021 04/25/2020 03/09/2020 12/05/2019 07/19/2019 04/27/2019 12/28/2018  Does Patient Have a Medical Advance Directive? Yes Yes Yes Yes Yes Yes Yes  Type of Paramedic of [place];Living will [place];Living will [place];Living will [place];Living will - [place];Living will [place];Living will  Does patient want to make changes to medical advance directive? Yes (MAU/Ambulatory/Procedural Areas -  Information given) No - Patient declined - No - Patient declined No - Patient declined No - Patient declined -  Copy of [place] in Chart? - No - copy requested No - copy requested No - copy requested - No - copy requested No - copy requested  Would patient like information on creating a medical advance directive? - - - - - - -    Current Medications (verified) Outpatient Encounter Medications as of 03/11/2021  Medication Sig   acetaminophen (TYLENOL) 500 MG tablet Take 500-1,000 mg by mouth every 6 (six) hours as needed for mild pain or headache.    albuterol (VENTOLIN HFA) 108 (90 Base) MCG/ACT inhaler Inhale 2 puffs into the lungs every 4 (four) hours as needed for wheezing or shortness of breath.   aspirin EC 81 MG tablet Take 81 mg by mouth daily. Swallow whole.   atorvastatin (LIPITOR) 40 MG tablet Take 1 tablet by mouth once daily   Calcium Carb-Cholecalciferol (CALCIUM 600 + D PO) Take 1 tablet by mouth 2 (two) times daily.   FLUoxetine (PROZAC) 20 MG capsule Take 1 capsule by mouth once daily   furosemide (LASIX) 40 MG tablet TAKE 1 TABLET BY MOUTH ONCE DAILY AS NEEDED   levETIRAcetam (KEPPRA) 250 MG tablet Take 250 mg by mouth 2 (two) times daily.    omeprazole (PRILOSEC) 40 MG capsule Take 40 mg by mouth daily.   OXYGEN Inhale 3 L into the lungs at bedtime.    STIOLTO RESPIMAT 2.5-2.5 MCG/ACT AERS INHALE 2 PUFFS BY MOUTH ONCE DAILY   Tiotropium Bromide-Olodaterol (STIOLTO RESPIMAT) 2.5-2.5 MCG/ACT AERS Inhale 2 puffs into the lungs daily.   tretinoin (  RETIN-A) 0.1 % cream Apply topically at bedtime.   zolpidem (AMBIEN) 10 MG tablet TAKE 1/2 TO 1 (ONE-HALF TO ONE) TABLET BY MOUTH AT BEDTIME AS NEEDED FOR SLEEP   [DISCONTINUED] furosemide (LASIX) 40 MG tablet Take 1 tablet (40 mg total) by mouth daily as needed.   No facility-administered encounter medications on file as of 03/11/2021.    Allergies (verified) Codeine and Morphine and related   History: Past  Medical History:  Diagnosis Date   Allergic rhinitis    Arthritis    Asthma    as a child, mild now   Breast cancer ([place]) 12/2015   right breast cancer, lumpectomy and mammosite    Colon polyps    colonoscopy 7/08, tubular adenoma   Complete heart block ([place]) 8/13   s/p PPM implanted in [place] [facility]   COPD (chronic obstructive pulmonary disease) ([place])    Myocardial infarction ([place]) 2011   Osteopenia 10/2015   Pacemaker    2011   Personal history of radiation therapy 2017   right breast ca, mammosite placed   Seizure disorder ([place])    Seizures ([place])    first one was when she was 71 years old    Small bowel obstruction ([place])    1988 and 2002   Tobacco abuse    Past Surgical History:  Procedure Laterality Date   ABDOMINAL HYSTERECTOMY     BREAST BIOPSY Right 2007   benign inflammatory changes, mass due to underwire bra   BREAST BIOPSY Left 01/02/2016   columnar cell changes without atypical hyperplasia.   BREAST BIOPSY Right 12/06/2015   rt breast mass 10:00, bx done at Dr. Curly [place] office, invasive ductal carcinoma   BREAST EXCISIONAL BIOPSY Left 01/02/2016   COLUMNAR CELL CHANGE AND HYPERPLASIA ASSOCIATED WITH LUMINAL AND STROMAL CALCIFICATIONS   BREAST LUMPECTOMY Right 01/02/2016   invasive mammary carcinoma, clear margins, negative LN   BREAST LUMPECTOMY WITH SENTINEL LYMPH NODE BIOPSY Right 01/02/2016   pT1c, N0; ER/ PR 100%; Her 2 neu not over expressed: BREAST LUMPECTOMY WITH SENTINEL LYMPH NODE BX;  Surgeon: [name], MD;  Location: [facility] ORS;  Service: General;  Laterality: Right;   CARDIAC CATHETERIZATION     CATARACT EXTRACTION Bilateral    COLONOSCOPY  10/2015   Dr Ardis Hughs   COLONOSCOPY WITH PROPOFOL N/A 08/19/2018   Procedure: COLONOSCOPY WITH PROPOFOL;  Surgeon: Virgel Manifold, MD;  Location: [facility] ENDOSCOPY;  Service: Endoscopy;  Laterality: N/A;   ESOPHAGOGASTRODUODENOSCOPY (EGD) WITH PROPOFOL N/A 08/19/2018   Procedure:  ESOPHAGOGASTRODUODENOSCOPY (EGD) WITH PROPOFOL;  Surgeon: Virgel Manifold, MD;  Location: [facility] ENDOSCOPY;  Service: Endoscopy;  Laterality: N/A;   EXPLORATORY LAPAROTOMY  01/25/2001   Exploratory laparotomy, lysis of adhesions, identification of internal hernia secondary to omental adhesion. Prolonged postoperative ileus.   gyn surgery  1993   hysterectomy- form endometriosis   LAPAROSCOPY     PACEMAKER INSERTION  10/24/11   [employer] Advantio dual chamber PPM implanted by Dr Bunnie Philips at [facility] in [place] N/A 12/05/2019   Procedure: [place];  Surgeon: Deboraha Sprang, MD;  Location: [facility];  Service: Cardiovascular;  Laterality: N/A;   RIGHT/LEFT HEART CATH AND CORONARY ANGIOGRAPHY Bilateral 07/19/2019   Procedure: RIGHT/LEFT HEART CATH AND CORONARY ANGIOGRAPHY;  Surgeon: Nelva Bush, MD;  Location: [facility];  Service: Cardiovascular;  Laterality: Bilateral;   TEMPORARY PACEMAKER N/A 12/05/2019   Procedure: TEMPORARY PACEMAKER;  Surgeon:  Deboraha Sprang, MD;  Location: [facility];  Service: Cardiovascular;  Laterality: N/A;   Family History  Problem Relation Age of Onset   Stroke Mother    Heart disease Mother 72       MI and CABG   Dementia Mother    Coronary artery disease Father    Parkinsonism Father    Cancer - Cervical Daughter 83       died 04-Apr-2022   Heart attack Brother 67   Colon cancer Neg Hx    Breast cancer Neg Hx    Social History   Socioeconomic History   Marital status: Married    Spouse name: Not on file   Number of children: Not on file   Years of education: Not on file   Highest education level: Not on file  Occupational History    Employer: RETIRED  Tobacco Use   Smoking status: Former    Packs/day: 1.50    Years: 42.00    Pack years: 63.00    Types: Cigarettes    Quit date: 11/11/2010    Years since quitting: 10.3   Smokeless tobacco: Never   Vaping Use   Vaping Use: Never used  Substance and Sexual Activity   Alcohol use: Yes    Alcohol/week: 4.0 standard drinks    Types: 4 Glasses of wine per week    Comment: weekly   Drug use: No   Sexual activity: Never  Other Topics Concern   Not on file  Social History Narrative   Married 40 years.   Exercises, does walking tapes   Social Determinants of Health   Financial Resource Strain: Low Risk    Difficulty of Paying Living Expenses: Not hard at all  Food Insecurity: No Food Insecurity   Worried About Charity fundraiser in the Last Year: Never true   [place] in the Last Year: Never true  Transportation Needs: No Transportation Needs   Lack of Transportation (Medical): No   Lack of Transportation (Non-Medical): No  Physical Activity: Insufficiently Active   Days of Exercise per Week: 7 days   Minutes of Exercise per Session: 20 min  Stress: No Stress Concern Present   Feeling of Stress : Only a little  Social Connections: Engineer, building services of Communication with Friends and Family: More than three times a week   Frequency of Social Gatherings with Friends and Family: More than three times a week   Attends Religious Services: More than 4 times per year   Active Member of [employer] or Organizations: Yes   Attends Music therapist: More than 4 times per year   Marital Status: Married    Tobacco Counseling Counseling given: Not Answered   Clinical Intake:  Pre-visit preparation completed: Yes  Pain : 0-10 Pain Score: 6  Pain Location: Back     BMI - recorded: 29.65 Nutritional Status: BMI 25 -29 Overweight Nutritional Risks: None Diabetes: No  How often do you need to have someone help you when you read instructions, pamphlets, or other written materials from your doctor or pharmacy?: 1 - Never  Diabetic? No  Interpreter Needed?: No  Information entered by :: Orrin Brigham LPN   Activities of Daily Living In your  present state of health, do you have any difficulty performing the following activities: 03/11/2021  Hearing? N  Vision? N  Difficulty concentrating or making decisions? N  Walking or climbing stairs? Y  Dressing or bathing?  N  Doing errands, shopping? N  Preparing Food and eating ? N  Using the Toilet? N  In the past six months, have you accidently leaked urine? N  Do you have problems with loss of bowel control? N  Managing your Medications? N  Managing your Finances? N  Housekeeping or managing your Housekeeping? N  Some recent data might be hidden    Patient Care Team: Tower, Wynelle Fanny, MD as PCP - General End, Harrell Gave, MD as PCP - Cardiology (Cardiology) Deboraha Sprang, MD as Consulting Physician (Cardiology) Lorelee Cover., MD as Referring Physician (Ophthalmology) Tower, Wynelle Fanny, MD as Consulting Physician (Family Medicine) Bary Castilla Forest Gleason, MD (General Surgery)  Indicate any recent Medical Services you may have received from other than [facility] providers in the past year (date may be approximate).     Assessment:   This is a routine wellness examination for [place].  Hearing/Vision screen Hearing Screening - Comments:: No issues Vision Screening - Comments:: Last exam 2 years ago, plans to make an appointment. Dr Gloriann Loan  Dietary issues and exercise activities discussed: Current Exercise Habits: Home exercise routine, Type of exercise: walking (walking exercise tape), Time (Minutes): 15, Frequency (Times/Week): 7, Weekly Exercise (Minutes/Week): 105   Goals Addressed             This Visit's Progress    Patient Stated       Would like to exercise and eat less.       Depression Screen PHQ 2/9 Scores 03/11/2021 03/09/2020 12/28/2018 12/08/2017 09/30/2016 07/26/2015 07/11/2014  PHQ - 2 Score 0 0 0 1 2 0 0  PHQ- 9 Score - 0 0 7 4 - -    Fall Risk Fall Risk  03/11/2021 03/09/2020 12/28/2018 12/08/2017 09/30/2016  Falls in the past year? 0 1 1 No No  Number falls in  past yr: 0 0 0 - -  Injury with Fall? 0 0 1 - -  Comment - - fractured wrist injury - -  Risk for fall due to : - No Fall Risks Medication side effect;History of fall(s) - -  Follow up Falls prevention discussed Falls evaluation completed;Falls prevention discussed Falls evaluation completed;Falls prevention discussed - -    FALL RISK PREVENTION PERTAINING TO THE HOME:  Any stairs in or around the home? No  If so, are there any without handrails? No  Home free of loose throw rugs in walkways, pet beds, electrical cords, etc? Yes  Adequate lighting in your home to reduce risk of falls? Yes   ASSISTIVE DEVICES UTILIZED TO PREVENT FALLS:  Life alert? No  Use of a cane, walker or w/c? No  Grab bars in the bathroom? Yes  Shower chair or bench in shower? Yes  Elevated toilet seat or a handicapped toilet? Yes   TIMED UP AND GO:  Was the test performed? No .    Cognitive Function: Normal cognitive status assessed by this Nurse Health Advisor. No abnormalities found.   MMSE - Mini Mental State Exam 03/09/2020 12/28/2018 12/08/2017 09/30/2016 07/26/2015  Orientation to time 5 5 5 5 5   Orientation to Place 5 5 5 5 5   Registration 3 3 3 3 3   Attention/ Calculation 5 5 0 0 0  Recall 3 3 3 3 2   Recall-comments - - - - pt was unable to recall 1 of 3 words  Language- name 2 objects - - 0 0 0  Language- repeat 1 1 1 1 1   Language- follow 3 step  command - - 3 3 3   Language- read & follow direction - - 0 0 0  Write a sentence - - 0 0 0  Copy design - - 0 0 0  Total score - - 20 20 19         Immunizations Immunization History  Administered Date(s) Administered   Fluad Quad(high Dose 65+) 10/24/2020   Influenza Whole 02/24/2005   Influenza, High Dose Seasonal PF 11/04/2018, 11/22/2019   Influenza,inj,Quad PF,6+ Mos 12/03/2012, 12/09/2013, 01/16/2015, 02/06/2016, 11/04/2016, 12/08/2017   PFIZER Comirnaty(Gray Top)Covid-19 Tri-Sucrose Vaccine 07/06/2020   PFIZER(Purple Top)SARS-COV-2  Vaccination 02/28/2019, 03/21/2019, 11/14/2019   Pneumococcal Conjugate-13 07/11/2014   Pneumococcal Polysaccharide-23 03/21/2005, 07/26/2015   Td 01/09/1999, 06/14/2007   Zoster, Live 09/07/2015    TDAP status: Due, Education has been provided regarding the importance of this vaccine. Advised may receive this vaccine at local pharmacy or Health Dept. Aware to provide a copy of the vaccination record if obtained from local pharmacy or Health Dept. Verbalized acceptance and understanding.  Flu Vaccine status: Up to date  Pneumococcal vaccine status: Up to date  Covid-19 vaccine status: Information provided on how to obtain vaccines.   Qualifies for Shingles Vaccine? Yes   Zostavax completed Yes   Shingrix Completed?: No.    Education has been provided regarding the importance of this vaccine. Patient has been advised to call insurance company to determine out of pocket expense if they have not yet received this vaccine. Advised may also receive vaccine at local pharmacy or Health Dept. Verbalized acceptance and understanding.  Screening Tests Health Maintenance  Topic Date Due   Zoster Vaccines- Shingrix (1 of 2) Never done   COVID-19 Vaccine (5 - Booster for Pfizer series) 08/31/2020   TETANUS/TDAP  03/10/2023 (Originally 06/13/2017)   COLONOSCOPY (Pts 45-105yrs Insurance coverage will need to be confirmed)  08/18/2021   MAMMOGRAM  12/27/2021   Pneumonia Vaccine 34+ Years old  Completed   INFLUENZA VACCINE  Completed   DEXA SCAN  Completed   Hepatitis C Screening  Completed   HPV VACCINES  Aged Out    Health Maintenance  Health Maintenance Due  Topic Date Due   Zoster Vaccines- Shingrix (1 of 2) Never done   COVID-19 Vaccine (5 - Booster for Pfizer series) 08/31/2020    Colorectal cancer screening: Type of screening: Colonoscopy. Completed 08/19/18. Repeat every 3 years  Mammogram status: Completed 12/27/20. Repeat every year  Bone Density status: Ordered 03/11/21. Pt  provided with contact info and advised to call to schedule appt.  Lung Cancer Screening: (Low Dose CT Chest recommended if Age 53-80 years, 30 pack-year currently smoking OR have quit w/in 15years.) does qualify. Lung CT scan completed 01/10/21    Additional Screening:  Hepatitis C Screening: does qualify; Completed 09/30/16  Vision Screening: Recommended annual ophthalmology exams for early detection of glaucoma and other disorders of the eye. Is the patient up to date with their annual eye exam?  Yes  Who is the provider or what is the name of the office in which the patient attends annual eye exams? Dr. Gloriann Loan   Dental Screening: Recommended annual dental exams for proper oral hygiene  Community Resource Referral / Chronic Care Management: CRR required this visit?  No   CCM required this visit?  No      Plan:     I have personally reviewed and noted the following in the patients chart:   Medical and social history Use of alcohol, tobacco or illicit drugs  Current medications and supplements including opioid prescriptions.  Functional ability and status Nutritional status Physical activity Advanced directives List of other physicians Hospitalizations, surgeries, and ER visits in previous 12 months Vitals Screenings to include cognitive, depression, and falls Referrals and appointments  In addition, I have reviewed and discussed with patient certain preventive protocols, quality metrics, and best practice recommendations. A written personalized care plan for preventive services as well as general preventive health recommendations were provided to patient.   Due to this being a telephonic visit, the after visit summary with patients personalized plan was offered to patient via mail or my-chart. Patient would like to access on my-chart.    Loma Messing, LPN   [id number]   Nurse Health Advisor  Nurse Notes: none

## 2021-03-08 NOTE — Telephone Encounter (Signed)
Please contact pt for future appointment. Pt overdue for 9 month fu.

## 2021-03-11 ENCOUNTER — Ambulatory Visit (INDEPENDENT_AMBULATORY_CARE_PROVIDER_SITE_OTHER): Payer: Medicare Other

## 2021-03-11 VITALS — Ht 68.0 in | Wt 195.0 lb

## 2021-03-11 DIAGNOSIS — Z0000 Encounter for general adult medical examination without abnormal findings: Secondary | ICD-10-CM | POA: Diagnosis not present

## 2021-03-11 DIAGNOSIS — Z780 Asymptomatic menopausal state: Secondary | ICD-10-CM | POA: Diagnosis not present

## 2021-03-11 NOTE — Patient Instructions (Signed)
Diana Cook , Thank you for taking time to complete your Medicare Wellness Visit. I appreciate your ongoing commitment to your health goals. Please review the following plan we discussed and let me know if I can assist you in the future.   Screening recommendations/referrals: Colonoscopy: up to date, completed 08/19/18, due 08/18/21 Mammogram: up to date, completed 12/27/20, due 12/27/21  Bone Density: due, last completed 01/26/18, ordered today someone will call to schedule an appointment Recommended yearly ophthalmology/optometry visit for glaucoma screening and checkup Recommended yearly dental visit for hygiene and checkup  Vaccinations: Influenza vaccine: up to date Pneumococcal vaccine: up to date Tdap vaccine: due, last completed 06/13/17, Discuss with your local pharmacy Shingles vaccine: Discuss with your local pharmacy Covid-19:newest booster available at your local pharmacy  Advanced directives: Please bring a copy of Living Will and/or Healthcare Power of Attorney for your chart.   Conditions/risks identified: see problem list  Next appointment: Follow up in one year for your annual wellness visit 03/12/22 @ 1:15pm, this will be a telephone visit   Preventive Care 65 Years and Older, Female Preventive care refers to lifestyle choices and visits with your health care provider that can promote health and wellness. What does preventive care include? A yearly physical exam. This is also called an annual well check. Dental exams once or twice a year. Routine eye exams. Ask your health care provider how often you should have your eyes checked. Personal lifestyle choices, including: Daily care of your teeth and gums. Regular physical activity. Eating a healthy diet. Avoiding tobacco and drug use. Limiting alcohol use. Practicing safe sex. Taking low-dose aspirin every day. Taking vitamin and mineral supplements as recommended by your health care provider. What happens during an  annual well check? The services and screenings done by your health care provider during your annual well check will depend on your age, overall health, lifestyle risk factors, and family history of disease. Counseling  Your health care provider may ask you questions about your: Alcohol use. Tobacco use. Drug use. Emotional well-being. Home and relationship well-being. Sexual activity. Eating habits. History of falls. Memory and ability to understand (cognition). Work and work Statistician. Reproductive health. Screening  You may have the following tests or measurements: Height, weight, and BMI. Blood pressure. Lipid and cholesterol levels. These may be checked every 5 years, or more frequently if you are over 79 years old. Skin check. Lung cancer screening. You may have this screening every year starting at age 75 if you have a 30-pack-year history of smoking and currently smoke or have quit within the past 15 years. Fecal occult blood test (FOBT) of the stool. You may have this test every year starting at age 16. Flexible sigmoidoscopy or colonoscopy. You may have a sigmoidoscopy every 5 years or a colonoscopy every 10 years starting at age 77. Hepatitis C blood test. Hepatitis B blood test. Sexually transmitted disease (STD) testing. Diabetes screening. This is done by checking your blood sugar (glucose) after you have not eaten for a while (fasting). You may have this done every 1-3 years. Bone density scan. This is done to screen for osteoporosis. You may have this done starting at age 40. Mammogram. This may be done every 1-2 years. Talk to your health care provider about how often you should have regular mammograms. Talk with your health care provider about your test results, treatment options, and if necessary, the need for more tests. Vaccines  Your health care provider may recommend certain vaccines, such  as: Influenza vaccine. This is recommended every year. Tetanus,  diphtheria, and acellular pertussis (Tdap, Td) vaccine. You may need a Td booster every 10 years. Zoster vaccine. You may need this after age 25. Pneumococcal 13-valent conjugate (PCV13) vaccine. One dose is recommended after age 76. Pneumococcal polysaccharide (PPSV23) vaccine. One dose is recommended after age 50. Talk to your health care provider about which screenings and vaccines you need and how often you need them. This information is not intended to replace advice given to you by your health care provider. Make sure you discuss any questions you have with your health care provider. Document Released: 03/09/2015 Document Revised: 10/31/2015 Document Reviewed: 12/12/2014 Elsevier Interactive Patient Education  2017 [place] Prevention in the Home Falls can cause injuries. They can happen to people of all ages. There are many things you can do to make your home safe and to help prevent falls. What can I do on the outside of my home? Regularly fix the edges of walkways and driveways and fix any cracks. Remove anything that might make you trip as you walk through a door, such as a raised step or threshold. Trim any bushes or trees on the path to your home. Use bright outdoor lighting. Clear any walking paths of anything that might make someone trip, such as rocks or tools. Regularly check to see if handrails are loose or broken. Make sure that both sides of any steps have handrails. Any raised decks and porches should have guardrails on the edges. Have any leaves, snow, or ice cleared regularly. Use sand or salt on walking paths during winter. Clean up any spills in your garage right away. This includes oil or grease spills. What can I do in the bathroom? Use night lights. Install grab bars by the toilet and in the tub and shower. Do not use towel bars as grab bars. Use non-skid mats or decals in the tub or shower. If you need to sit down in the shower, use a plastic,  non-slip stool. Keep the floor dry. Clean up any water that spills on the floor as soon as it happens. Remove soap buildup in the tub or shower regularly. Attach bath mats securely with double-sided non-slip rug tape. Do not have throw rugs and other things on the floor that can make you trip. What can I do in the bedroom? Use night lights. Make sure that you have a light by your bed that is easy to reach. Do not use any sheets or blankets that are too big for your bed. They should not hang down onto the floor. Have a firm chair that has side arms. You can use this for support while you get dressed. Do not have throw rugs and other things on the floor that can make you trip. What can I do in the kitchen? Clean up any spills right away. Avoid walking on wet floors. Keep items that you use a lot in easy-to-reach places. If you need to reach something above you, use a strong step stool that has a grab bar. Keep electrical cords out of the way. Do not use floor polish or wax that makes floors slippery. If you must use wax, use non-skid floor wax. Do not have throw rugs and other things on the floor that can make you trip. What can I do with my stairs? Do not leave any items on the stairs. Make sure that there are handrails on both sides of the stairs and use  them. Fix handrails that are broken or loose. Make sure that handrails are as long as the stairways. Check any carpeting to make sure that it is firmly attached to the stairs. Fix any carpet that is loose or worn. Avoid having throw rugs at the top or bottom of the stairs. If you do have throw rugs, attach them to the floor with carpet tape. Make sure that you have a light switch at the top of the stairs and the bottom of the stairs. If you do not have them, ask someone to add them for you. What else can I do to help prevent falls? Wear shoes that: Do not have high heels. Have rubber bottoms. Are comfortable and fit you well. Are closed  at the toe. Do not wear sandals. If you use a stepladder: Make sure that it is fully opened. Do not climb a closed stepladder. Make sure that both sides of the stepladder are locked into place. Ask someone to hold it for you, if possible. Clearly mark and make sure that you can see: Any grab bars or handrails. First and last steps. Where the edge of each step is. Use tools that help you move around (mobility aids) if they are needed. These include: Canes. Walkers. Scooters. Crutches. Turn on the lights when you go into a dark area. Replace any light bulbs as soon as they burn out. Set up your furniture so you have a clear path. Avoid moving your furniture around. If any of your floors are uneven, fix them. If there are any pets around you, be aware of where they are. Review your medicines with your doctor. Some medicines can make you feel dizzy. This can increase your chance of falling. Ask your doctor what other things that you can do to help prevent falls. This information is not intended to replace advice given to you by your health care provider. Make sure you discuss any questions you have with your health care provider. Document Released: 12/07/2008 Document Revised: 07/19/2015 Document Reviewed: 03/17/2014 Elsevier Interactive Patient Education  2017 [employer].

## 2021-03-19 NOTE — Progress Notes (Signed)
Remote pacemaker transmission.   

## 2021-03-21 DIAGNOSIS — Z961 Presence of intraocular lens: Secondary | ICD-10-CM | POA: Diagnosis not present

## 2021-03-27 DIAGNOSIS — I480 Paroxysmal atrial fibrillation: Secondary | ICD-10-CM

## 2021-03-27 HISTORY — DX: Paroxysmal atrial fibrillation: I48.0

## 2021-04-02 ENCOUNTER — Encounter: Payer: Self-pay | Admitting: Pulmonary Disease

## 2021-04-02 ENCOUNTER — Other Ambulatory Visit: Payer: Self-pay

## 2021-04-02 ENCOUNTER — Ambulatory Visit (INDEPENDENT_AMBULATORY_CARE_PROVIDER_SITE_OTHER): Payer: Medicare Other | Admitting: Pulmonary Disease

## 2021-04-02 VITALS — BP 110/78 | HR 70 | Temp 96.9°F | Ht 68.0 in | Wt 198.6 lb

## 2021-04-02 DIAGNOSIS — J9611 Chronic respiratory failure with hypoxia: Secondary | ICD-10-CM

## 2021-04-02 DIAGNOSIS — R911 Solitary pulmonary nodule: Secondary | ICD-10-CM

## 2021-04-02 DIAGNOSIS — R0602 Shortness of breath: Secondary | ICD-10-CM

## 2021-04-02 DIAGNOSIS — Z87891 Personal history of nicotine dependence: Secondary | ICD-10-CM | POA: Diagnosis not present

## 2021-04-02 DIAGNOSIS — J432 Centrilobular emphysema: Secondary | ICD-10-CM

## 2021-04-02 NOTE — Progress Notes (Unsigned)
Subjective:    Patient ID: Diana Cook, female    DOB: 1950-06-05, 71 y.o.   MRN: [id number] Chief Complaint  Patient presents with   Follow-up    HPI    Review of Systems A 10 point review of systems was performed and it is as noted above otherwise negative.  Patient Active Problem List   Diagnosis Date Noted   Current use of proton pump inhibitor 10/24/2020   Sleep apnea 10/24/2020   Fatigue 10/24/2020   Orthostatic lightheadedness 05/16/2020   Radicular pain in left arm 03/02/2020   Neck pain on left side 02/15/2020   Paresthesia 02/15/2020   Chronic respiratory failure with hypoxia ([place]) 11/17/2019   Chronic heart failure with preserved ejection fraction (HFpEF) ([place]) 08/11/2019   Coronary artery disease involving native coronary artery of native heart without angina pectoris 06/20/2019   Gastroesophageal reflux disease    Stomach irritation    Abnormal CT scan, esophagus    Polyp of ascending colon    Personal history of tobacco use, presenting hazards to health 01/18/2018   Grade I diastolic dysfunction 03/00/9233   Globus pharyngeus 12/21/2017   Chronic obstructive pulmonary disease ([place]) 12/15/2017   Hyperlipidemia LDL goal <70 12/07/2017   Seizures ([place]) 10/01/2016   Elevated glucose 08/31/2016   Grief reaction 04/11/2016   Malignant neoplasm of upper-outer quadrant of right breast in female, estrogen receptor positive ([place]) 12/11/2015   Osteoporosis 11/22/2015   Estrogen deficiency 09/07/2015   Bruising 09/07/2015   Wrinkles 09/07/2015   Post herpetic neuralgia 09/01/2014   Encounter for Medicare annual wellness exam 07/11/2014   Routine general medical examination at a health care facility 06/26/2013   H/O small bowel obstruction 04/15/2013   Dyspnea on exertion 02/10/2013   Complete heart block ([place]) 11/03/2011   Pacemaker-[employer] 11/03/2011   Lumbar spinal stenosis 09/02/2011   Shortness of breath 10/30/2010   POSTMENOPAUSAL STATUS  08/06/2009   HIDRADENITIS SUPPURATIVA 06/26/2008   HERNIATED DISC 12/14/2007   BACK, LOWER, PAIN 12/02/2007   HX, PERSONAL, COLONIC POLYPS 09/14/2006   Former smoker 06/10/2006   ASTHMA 06/10/2006   H/O idiopathic seizure 06/10/2006   Social History   Tobacco Use   Smoking status: Former    Packs/day: 1.50    Years: 42.00    Pack years: 63.00    Types: Cigarettes    Quit date: 11/11/2010    Years since quitting: 10.3   Smokeless tobacco: Never  Substance Use Topics   Alcohol use: Yes    Alcohol/week: 4.0 standard drinks    Types: 4 Glasses of wine per week    Comment: weekly   Allergies  Allergen Reactions   Codeine Nausea And Vomiting   Morphine And Related Nausea Only   Current Meds  Medication Sig   acetaminophen (TYLENOL) 500 MG tablet Take 500-1,000 mg by mouth every 6 (six) hours as needed for mild pain or headache.    albuterol (VENTOLIN HFA) 108 (90 Base) MCG/ACT inhaler Inhale 2 puffs into the lungs every 4 (four) hours as needed for wheezing or shortness of breath.   aspirin EC 81 MG tablet Take 81 mg by mouth daily. Swallow whole.   atorvastatin (LIPITOR) 40 MG tablet Take 1 tablet by mouth once daily   Calcium Carb-Cholecalciferol (CALCIUM 600 + D PO) Take 1 tablet by mouth 2 (two) times daily.   FLUoxetine (PROZAC) 20 MG capsule Take 1 capsule by mouth once daily   furosemide (LASIX) 40 MG tablet TAKE 1 TABLET  BY MOUTH ONCE DAILY AS NEEDED   levETIRAcetam (KEPPRA) 250 MG tablet Take 250 mg by mouth 2 (two) times daily.    omeprazole (PRILOSEC) 40 MG capsule Take 40 mg by mouth daily.   OXYGEN Inhale 3 L into the lungs at bedtime.    STIOLTO RESPIMAT 2.5-2.5 MCG/ACT AERS INHALE 2 PUFFS BY MOUTH ONCE DAILY   Tiotropium Bromide-Olodaterol (STIOLTO RESPIMAT) 2.5-2.5 MCG/ACT AERS Inhale 2 puffs into the lungs daily.   tretinoin (RETIN-A) 0.1 % cream Apply topically at bedtime.   zolpidem (AMBIEN) 10 MG tablet TAKE 1/2 TO 1 (ONE-HALF TO ONE) TABLET BY MOUTH AT  BEDTIME AS NEEDED FOR SLEEP   Immunization History  Administered Date(s) Administered   Fluad Quad(high Dose 65+) 10/24/2020   Influenza Whole 02/24/2005   Influenza, High Dose Seasonal PF 11/04/2018, 11/22/2019   Influenza,inj,Quad PF,6+ Mos 12/03/2012, 12/09/2013, 01/16/2015, 02/06/2016, 11/04/2016, 12/08/2017   PFIZER Comirnaty(Gray Top)Covid-19 Tri-Sucrose Vaccine 07/06/2020   PFIZER(Purple Top)SARS-COV-2 Vaccination 02/28/2019, 03/21/2019, 11/14/2019   Pneumococcal Conjugate-13 07/11/2014   Pneumococcal Polysaccharide-23 03/21/2005, 07/26/2015   Td 01/09/1999, 06/14/2007   Zoster, Live 09/07/2015          Objective:   Physical Exam BP 110/78 (BP Location: Left Arm, Patient Position: Sitting, Cuff Size: Normal)    Pulse 70    Temp (!) 96.9 F (36.1 C) (Oral)    Ht 5\' 8"  (1.727 m)    Wt 198 lb 9.6 oz (90.1 kg)    SpO2 94%    BMI 30.20 kg/m  GENERAL: Well-developed well-nourished woman in no acute respiratory distress.  She is fully ambulatory.  No conversational dyspnea.  Does sound hoarse. HEAD: Normocephalic, atraumatic. EYES: Pupils equal, round, reactive to light.  No scleral icterus. MOUTH: Nose/mouth/throat not examined due to masking requirements for COVID 19. NECK: Supple. No thyromegaly. No nodules. No JVD.  Trachea midline. PULMONARY: Symmetrical air entry, clear with no adventitious sounds CARDIOVASCULAR: S1 and S2. Regular rate and rhythm. No rubs murmurs gallops heard. GASTROINTESTINAL: Nondistended. MUSCULOSKELETAL: No joint deformity, no clubbing, no edema. NEUROLOGIC: Awake, alert, oriented x4, no focal deficits. SKIN: Intact,warm,dry, on limited exam no rashes. PSYCH: Normal mood and behavior.       Assessment & Plan:       Orders Placed This Encounter  Procedures   AMB referral to pulmonary rehabilitation    Referral Priority:   Routine    Referral Type:   Consultation    Number of Visits Requested:   1

## 2021-04-02 NOTE — Patient Instructions (Signed)
Continue using the Stiolto.  Continue using your oxygen.  We are referring you to pulmonary rehab I think this will help you with your breathing.  It would also help you get some exercise.  Follow-up in 2 to 3 months time call sooner should any new problems arise peer

## 2021-04-03 DIAGNOSIS — Z20822 Contact with and (suspected) exposure to covid-19: Secondary | ICD-10-CM | POA: Diagnosis not present

## 2021-04-05 ENCOUNTER — Telehealth: Payer: Self-pay

## 2021-04-05 DIAGNOSIS — M5416 Radiculopathy, lumbar region: Secondary | ICD-10-CM | POA: Diagnosis not present

## 2021-04-05 DIAGNOSIS — M48062 Spinal stenosis, lumbar region with neurogenic claudication: Secondary | ICD-10-CM | POA: Diagnosis not present

## 2021-04-05 NOTE — Telephone Encounter (Signed)
BSC alert Atrial Arrhythmia Burden of at least 6.0 hours in a 24 hour period. Available EGM's show AF/flutter in progress from 2/6 @ 20:03, controlled ventricular rates Burden <1%, ASA only  AF/Afl appears to be ongoing since late 03/31/21.  Contacted patient, she denies any current symptoms.  She has not noticed that she was in AF.   Patient does have history of brief episodes of AF, not currently on [place].  Pt sent f/u transmission, that confirms she is out of AF.  Episode lasted a total of about 40 hours, which is longer than previous episodes.          Follow-up EGM

## 2021-04-05 NOTE — Telephone Encounter (Signed)
To Dr. Caryl Comes to review. She currently has an appointment with Dr. Saunders Revel on 04/11/21 that was previously scheduled.

## 2021-04-09 NOTE — Telephone Encounter (Signed)
Nelva Bush, MD [phone])  Sent: Tue April 09, 2021  6:27 AM  To: Deboraha Sprang, MD; York Ram, RN; Emily Filbert, RN          Message  Sure.  Thanks for the heads up.    Gerald Stabs    ----- Message -----  From: Deboraha Sprang, MD  Sent: 04/08/2021   8:56 PM EST  To: Nelva Bush, MD, Emily Filbert, RN, *    Gerald Stabs  Mrs Labarbera had AFib- subclinical but > 24 hrs duration--so should begin on anticoagulation and you are scheduled to see her 2/16   can you have that conversation with her plz  Thanks SK

## 2021-04-11 ENCOUNTER — Other Ambulatory Visit
Admission: RE | Admit: 2021-04-11 | Discharge: 2021-04-11 | Disposition: A | Discharge status: Home / Self Care | Payer: Medicare Other | Source: Ambulatory Visit | Attending: Internal Medicine | Admitting: Internal Medicine

## 2021-04-11 ENCOUNTER — Encounter: Payer: Self-pay | Admitting: Internal Medicine

## 2021-04-11 ENCOUNTER — Other Ambulatory Visit: Payer: Self-pay

## 2021-04-11 ENCOUNTER — Ambulatory Visit (INDEPENDENT_AMBULATORY_CARE_PROVIDER_SITE_OTHER): Payer: Medicare Other | Admitting: Internal Medicine

## 2021-04-11 VITALS — BP 110/64 | HR 69 | Ht 68.0 in | Wt 203.0 lb

## 2021-04-11 DIAGNOSIS — I480 Paroxysmal atrial fibrillation: Secondary | ICD-10-CM | POA: Insufficient documentation

## 2021-04-11 DIAGNOSIS — J9611 Chronic respiratory failure with hypoxia: Secondary | ICD-10-CM

## 2021-04-11 DIAGNOSIS — I2510 Atherosclerotic heart disease of native coronary artery without angina pectoris: Secondary | ICD-10-CM | POA: Insufficient documentation

## 2021-04-11 DIAGNOSIS — I442 Atrioventricular block, complete: Secondary | ICD-10-CM | POA: Insufficient documentation

## 2021-04-11 DIAGNOSIS — I5032 Chronic diastolic (congestive) heart failure: Secondary | ICD-10-CM

## 2021-04-11 DIAGNOSIS — R42 Dizziness and giddiness: Secondary | ICD-10-CM | POA: Diagnosis not present

## 2021-04-11 LAB — CBC
HCT: 39.4 % (ref 36.0–46.0)
Hemoglobin: 12.5 g/dL (ref 12.0–15.0)
MCH: 29.1 pg (ref 26.0–34.0)
MCHC: 31.7 g/dL (ref 30.0–36.0)
MCV: 91.6 fL (ref 80.0–100.0)
Platelets: 267 10*3/uL (ref 150–400)
RBC: 4.3 MIL/uL (ref 3.87–5.11)
RDW: 13.7 % (ref 11.5–15.5)
WBC: 8 10*3/uL (ref 4.0–10.5)
nRBC: 0 % (ref 0.0–0.2)

## 2021-04-11 LAB — COMPREHENSIVE METABOLIC PANEL
ALT: 17 U/L (ref 0–44)
AST: 17 U/L (ref 15–41)
Albumin: 3.9 g/dL (ref 3.5–5.0)
Alkaline Phosphatase: 72 U/L (ref 38–126)
Anion gap: 5 (ref 5–15)
BUN: 23 mg/dL (ref 8–23)
CO2: 26 mmol/L (ref 22–32)
Calcium: 9.3 mg/dL (ref 8.9–10.3)
Chloride: 104 mmol/L (ref 98–111)
Creatinine, Ser: 0.87 mg/dL (ref 0.44–1.00)
GFR, Estimated: >60 mL/min (ref >60)
Glucose, Bld: 97 mg/dL (ref 70–99)
Potassium: 4.5 mmol/L (ref 3.5–5.1)
Sodium: 135 mmol/L (ref 135–145)
Total Bilirubin: 0.8 mg/dL (ref 0.3–1.2)
Total Protein: 7.1 g/dL (ref 6.5–8.1)

## 2021-04-11 LAB — PROTIME-INR
INR: 1 (ref 0.8–1.2)
Prothrombin Time: 13.5 seconds (ref 11.4–15.2)

## 2021-04-11 LAB — MAGNESIUM: Magnesium: 2 mg/dL (ref 1.7–2.4)

## 2021-04-11 LAB — TSH: TSH: 1.079 u[IU]/mL (ref 0.350–4.500)

## 2021-04-11 MED ORDER — APIXABAN 5 MG PO TABS: 5.0000 mg | ORAL_TABLET | Freq: Two times a day (BID) | ORAL | 0 refills | Status: DC

## 2021-04-11 NOTE — Patient Instructions (Signed)
Medication Instructions:   Your physician has recommended you make the following change in your medication:   STOP Aspirin 81 mg  START Eliquis 5 mg TWICE daily - Samples given today in office   Use 30 day card when you pick up first Rx       - Please DO NOT start Eliquis until our office has called you to verify ok to start  *If you need a refill on your cardiac medications before your next appointment, please call your pharmacy*   Lab Work:  Today at the medical mall at [facility]: CBC, PT/INR, CMET, TSH, Magnesium  If you have labs (blood work) drawn today and your tests are completely normal, you will receive your results only by: [place] (if you have MyChart) OR A paper copy in the mail If you have any lab test that is abnormal or we need to change your treatment, we will call you to review the results.   Testing/Procedures:  None ordered   Follow-Up: At [facility], you and your health needs are our priority.  As part of our continuing mission to provide you with exceptional heart care, we have created designated Provider Care Teams.  These Care Teams include your primary Cardiologist (physician) and Advanced Practice Providers (APPs -  Physician Assistants and Nurse Practitioners) who all work together to provide you with the care you need, when you need it.  We recommend signing up for the patient portal called "MyChart".  Sign up information is provided on this After Visit Summary.  MyChart is used to connect with patients for Virtual Visits (Telemedicine).  Patients are able to view lab/test results, encounter notes, upcoming appointments, etc.  Non-urgent messages can be sent to your provider as well.   To learn more about what you can do with MyChart, go to [email or web address].    Your next appointment:    1) 3 month(s)  2) We will contact you regarding a follow up with our anti-coagulation team  The format for your next appointment:   In  Person  Provider:   You may see Nelva Bush, MD or one of the following Advanced Practice Providers on your designated Care Team:   Murray Hodgkins, NP Christell Faith, PA-C Cadence Kathlen Mody, [place]

## 2021-04-11 NOTE — Progress Notes (Signed)
Follow-up Outpatient Visit Date: 04/11/2021  Primary Care Provider: Abner Greenspan, MD [place] [address]  Chief Complaint: Newly identified atrial fibrillation  HPI:  Diana Cook is a 71 y.o. female with history of moderate coronary artery disease by cardiac CTA in 11/2018 and catheterization in 06/2019, complete heart block status post permanent pacemaker followed by Dr. Caryl Comes, seizure disorder, COPD, and right breast cancer, who presents for follow-up of coronary artery disease and incidentally noted paroxysmal atrial fibrillation on recent pacemaker interrogation.  I last saw her in 04/2020, at which time she was feeling fairly well other than ongoing dental issues.  Her chronic exertional dyspnea was stable.  Due to chronic orthostatic lightheadedness that seem better with regular abdominal binder use and hydration, we agreed to switch her furosemide to as needed dosing.  She saw Dr. Caryl Comes in late 07/2020 and reported improvement in her orthostasis.  Dr. Caryl Comes alerted me this week to recent pacemaker interrogation demonstrating newly diagnosed atrial fibrillation.  At least 6 hours were recorded over 24-hour period, though overall atrial fibrillation burden was less than 1%.  Dr. Caryl Comes recommended consideration of [place].  Today, [name] reports feeling fairly well.  She notes that she is using oxygen more regularly now and has put on weight due to being less active.  She has not had any chest pain.  She has stable exertional dyspnea.  She reports some possible palpitations but is not sure and does not pay much attention to what she feels.  She has experienced sporadic chest pains, usually in the upper back that happen randomly.  There are no associated symptoms.  She wonders if the pain could be indigestion and it typically resolves after taking acetaminophen.  She has not had any falls or bleeding.  She denies lightheadedness and edema.  She continues to struggle with back and  other orthopedic issues.  She had an epidural injection last week.  --------------------------------------------------------------------------------------------------  Past Medical History:  Diagnosis Date   Allergic rhinitis    Arthritis    Asthma    as a child, mild now   Breast cancer ([place]) 12/2015   right breast cancer, lumpectomy and mammosite    Colon polyps    colonoscopy 7/08, tubular adenoma   Complete heart block ([place]) 09/2011   s/p PPM implanted in [facility]   COPD (chronic obstructive pulmonary disease) ([place])    Myocardial infarction ([place]) 2011   Osteopenia 10/2015   Pacemaker    2011   Paroxysmal atrial fibrillation ([place]) 03/2021   Incidentally detected on pacemaker interrogation   Personal history of radiation therapy 2017   right breast ca, mammosite placed   Seizure disorder ([place])    Seizures ([place])    first one was when she was 71 years old    Small bowel obstruction ([place])    1988 and 2002   Tobacco abuse    Past Surgical History:  Procedure Laterality Date   ABDOMINAL HYSTERECTOMY     BREAST BIOPSY Right 2007   benign inflammatory changes, mass due to underwire bra   BREAST BIOPSY Left 01/02/2016   columnar cell changes without atypical hyperplasia.   BREAST BIOPSY Right 12/06/2015   rt breast mass 10:00, bx done at Dr. Curly [place] office, invasive ductal carcinoma   BREAST EXCISIONAL BIOPSY Left 01/02/2016   COLUMNAR CELL CHANGE AND HYPERPLASIA ASSOCIATED WITH LUMINAL AND STROMAL CALCIFICATIONS   BREAST LUMPECTOMY Right 01/02/2016   invasive mammary carcinoma, clear margins,  negative LN   BREAST LUMPECTOMY WITH SENTINEL LYMPH NODE BIOPSY Right 01/02/2016   pT1c, N0; ER/ PR 100%; Her 2 neu not over expressed: BREAST LUMPECTOMY WITH SENTINEL LYMPH NODE BX;  Surgeon: [name], MD;  Location: [facility];  Service: General;  Laterality: Right;   CARDIAC CATHETERIZATION     CATARACT EXTRACTION Bilateral    COLONOSCOPY  10/2015   Dr Ardis Hughs    COLONOSCOPY WITH PROPOFOL N/A 08/19/2018   Procedure: COLONOSCOPY WITH PROPOFOL;  Surgeon: Virgel Manifold, MD;  Location: [facility] END[facility];  Service: Endoscopy;  Laterality: N/A;   ESOPHAGOGASTRODUODENOSCOPY (EGD) WITH PROPOFOL N/A 08/19/2018   Procedure: ESOPHAGOGASTRODUODENOSCOPY (EGD) WITH PROPOFOL;  Surgeon: Virgel Manifold, MD;  Location: [facility] ENDOSCOPY;  Service: Endoscopy;  Laterality: N/A;   EXPLORATORY LAPAROTOMY  01/25/2001   Exploratory laparotomy, lysis of adhesions, identification of internal hernia secondary to omental adhesion. Prolonged postoperative ileus.   gyn surgery  1993   hysterectomy- form endometriosis   LAPAROSCOPY     PACEMAKER INSERTION  10/24/11   [employer] Advantio dual chamber PPM implanted by Dr Bunnie Philips at [facility] in [place] N/A 12/05/2019   Procedure: [place];  Surgeon: Deboraha Sprang, MD;  Location: [facility];  Service: Cardiovascular;  Laterality: N/A;   RIGHT/LEFT HEART CATH AND CORONARY ANGIOGRAPHY Bilateral 07/19/2019   Procedure: RIGHT/LEFT HEART CATH AND CORONARY ANGIOGRAPHY;  Surgeon: Nelva Bush, MD;  Location: [place] [facility];  Service: Cardiovascular;  Laterality: Bilateral;   TEMPORARY PACEMAKER N/A 12/05/2019   Procedure: TEMPORARY PACEMAKER;  Surgeon: Deboraha Sprang, MD;  Location: [facility];  Service: Cardiovascular;  Laterality: N/A;    Current Meds  Medication Sig   acetaminophen (TYLENOL) 500 MG tablet Take 500-1,000 mg by mouth every 6 (six) hours as needed for mild pain or headache.    albuterol (VENTOLIN HFA) 108 (90 Base) MCG/ACT inhaler Inhale 2 puffs into the lungs every 4 (four) hours as needed for wheezing or shortness of breath.   aspirin EC 81 MG tablet Take 81 mg by mouth daily. Swallow whole.   atorvastatin (LIPITOR) 40 MG tablet Take 1 tablet by mouth once daily   Calcium Carb-Cholecalciferol (CALCIUM 600 + D PO) Take 1  tablet by mouth 2 (two) times daily.   FLUoxetine (PROZAC) 20 MG capsule Take 1 capsule by mouth once daily   furosemide (LASIX) 40 MG tablet TAKE 1 TABLET BY MOUTH ONCE DAILY AS NEEDED   levETIRAcetam (KEPPRA) 250 MG tablet Take 250 mg by mouth 2 (two) times daily.    omeprazole (PRILOSEC) 40 MG capsule Take 40 mg by mouth daily.   OXYGEN Inhale 3 L into the lungs at bedtime.    STIOLTO RESPIMAT 2.5-2.5 MCG/ACT AERS INHALE 2 PUFFS BY MOUTH ONCE DAILY   Tiotropium Bromide-Olodaterol (STIOLTO RESPIMAT) 2.5-2.5 MCG/ACT AERS Inhale 2 puffs into the lungs daily.   tretinoin (RETIN-A) 0.1 % cream Apply topically at bedtime.   zolpidem (AMBIEN) 10 MG tablet TAKE 1/2 TO 1 (ONE-HALF TO ONE) TABLET BY MOUTH AT BEDTIME AS NEEDED FOR SLEEP    Allergies: Codeine and Morphine and related  Social History   Tobacco Use   Smoking status: Former    Packs/day: 1.50    Years: 42.00    Pack years: 63.00    Types: Cigarettes    Quit date: 11/11/2010    Years since quitting: 10.4   Smokeless tobacco: Never  Vaping Use  Vaping Use: Never used  Substance Use Topics   Alcohol use: Yes    Alcohol/week: 4.0 standard drinks    Types: 4 Glasses of wine per week    Comment: weekly   Drug use: No    Family History  Problem Relation Age of Onset   Stroke Mother    Heart disease Mother 57       MI and CABG   Dementia Mother    Coronary artery disease Father    Parkinsonism Father    Cancer - Cervical Daughter 82       died 04-01-22   Heart attack Brother 15   Colon cancer Neg Hx    Breast cancer Neg Hx     Review of Systems: A 12-system review of systems was performed and was negative except as noted in the HPI.  --------------------------------------------------------------------------------------------------  Physical Exam: BP 110/64 (BP Location: Left Arm, Patient Position: Sitting, Cuff Size: Large)    Pulse 69    Ht 5\' 8"  (1.727 m)    Wt 203 lb (92.1 kg)    SpO2 95%    BMI 30.87 kg/m    General:  NAD. Neck: No JVD or HJR. Lungs: Mildly diminished breath sounds throughout without wheezes or crackles Heart: Regular rate and rhythm without murmurs, rubs, or gallops. Abdomen: Soft, nontender, nondistended. Extremities: No lower extremity edema.  EKG: Normal sinus rhythm with atrially sensed and ventricularly paced rhythm.  Lab Results  Component Value Date   WBC 5.6 10/24/2020   HGB 12.9 10/24/2020   HCT 38.4 10/24/2020   MCV 89.4 10/24/2020   PLT 280.0 10/24/2020    Lab Results  Component Value Date   NA 135 10/24/2020   K 4.4 10/24/2020   CL 103 10/24/2020   CO2 24 10/24/2020   BUN 24 (H) 10/24/2020   CREATININE 1.14 10/24/2020   GLUCOSE 77 10/24/2020   ALT 19 10/24/2020    Lab Results  Component Value Date   CHOL 161 03/09/2020   HDL 65.90 03/09/2020   LDLCALC 72 03/09/2020   TRIG 120.0 03/09/2020   CHOLHDL 2 03/09/2020    --------------------------------------------------------------------------------------------------  ASSESSMENT AND PLAN: Paroxysmal atrial fibrillation: Atrial fibrillation incidentally noted on recent device interrogation.  The overall burden is low and Ms. Hissong is largely asymptomatic, Dr. Caryl Comes is concerned about risk for cardioembolism.  Given a CHA2DS2-VASc score of 3, we have agreed to initiate anticoagulation with apixaban 5 mg twice daily.  I have reached out to Ms. Wike's physiatrist who administered her epidural injection last week; it is reasonable to initiate anticoagulation at this time.  We will discontinue aspirin.  I will check a CBC, CMP, magnesium level, TSH, and PT/INR today.  We will arrangements for Ms. Warehime to meet with our anticoagulation clinic in a few weeks to ensure that she is tolerating anticoagulation well and ensure that she does not have any questions or concerns about long-term AC.  Complete heart block: EKG today shows atrially sensed and ventricularly paced rhythm.  Continue follow-up with Dr.  Caryl Comes and the device clinic.  Nonobstructive coronary artery disease: Ms. Fong has sporadic atypical chest pain not consistent with angina.  Prior CTA and catheterization did not show any obstructive CAD.  Continue medical therapy to prevent progression of disease.  We will discontinue aspirin with initiation of apixaban.  Chronic respiratory failure with hypoxia: Driven primarily by COPD.  Continue close follow-up with Dr. Patsey Berthold.  Orthostatic lightheadedness and HFpEF: Lightheadedness improved with as needed rather than  standing furosemide.  Ms. Yeater appears euvolemic today.  No medication changes today.  Follow-up: Return to clinic in 3 months.  Nelva Bush, MD 04/11/2021 9:44 AM

## 2021-04-12 ENCOUNTER — Telehealth: Payer: Self-pay | Admitting: *Deleted

## 2021-04-12 NOTE — Telephone Encounter (Signed)
Thank you, noted. Pt would be scheduled at [facility].   Will forward to scheduling to set up appointment.

## 2021-04-12 NOTE — Telephone Encounter (Signed)
-----   Message from Malen Gauze, RN sent at 04/12/2021  2:15 PM EST ----- Regarding: RE: pt follow up That's will be fine.  Assuming she want the appointment in [place], just put her on Mandi's schedule ([facility])  for Education Only per Dr End. ----- Message ----- From: Solmon Ice, RN Sent: 04/12/2021   2:12 PM EST To: Solmon Ice, RN, [facility] Subject: pt follow up                                   Hello,  Dr. Saunders Revel saw this pt 04/11/21. She was started on Eliquis 5 mg BID for paroxysmal a fib.   Dr. Saunders Revel wanted to know if we could schedule an appointment with anti-coag to follow up in about 2-3 weeks after Eliquis start for pt follow up, provide education, etc.  Thanks,  [employer]

## 2021-04-14 ENCOUNTER — Other Ambulatory Visit: Payer: Self-pay | Admitting: Family Medicine

## 2021-04-14 ENCOUNTER — Other Ambulatory Visit: Payer: Self-pay | Admitting: Internal Medicine

## 2021-04-17 ENCOUNTER — Encounter: Payer: Self-pay | Admitting: Family Medicine

## 2021-04-17 ENCOUNTER — Ambulatory Visit: Payer: Medicare Other | Admitting: Family Medicine

## 2021-04-17 ENCOUNTER — Other Ambulatory Visit: Payer: Self-pay

## 2021-04-17 ENCOUNTER — Ambulatory Visit (INDEPENDENT_AMBULATORY_CARE_PROVIDER_SITE_OTHER): Payer: Medicare Other | Admitting: Family Medicine

## 2021-04-17 VITALS — BP 114/66 | HR 75 | Temp 98.1°F | Ht 68.5 in | Wt 195.1 lb

## 2021-04-17 DIAGNOSIS — I2510 Atherosclerotic heart disease of native coronary artery without angina pectoris: Secondary | ICD-10-CM

## 2021-04-17 DIAGNOSIS — M25561 Pain in right knee: Secondary | ICD-10-CM

## 2021-04-17 MED ORDER — DEXAMETHASONE SODIUM PHOSPHATE 100 MG/10ML IJ SOLN
10.0000 mg | Freq: Once | INTRAMUSCULAR | Status: AC
  Administered 2021-04-17: 10 mg via INTRAMUSCULAR

## 2021-04-17 NOTE — Patient Instructions (Signed)
Wear a neoprene sleeve that you can easily get at a pharmacy.  Do not kneel on your knee.

## 2021-04-17 NOTE — Progress Notes (Signed)
Diana Cook T. Diana Welling, MD, [place] at [facility] [place], [address]  Phone: [phone]   FAX: [phone]  Diana Cook - 71 y.o. female   MRN [id number]   Date of Birth: 04/22/1950  Date: 04/17/2021   PCP: Abner Greenspan, MD   Referral: Abner Greenspan, MD  Chief Complaint  Patient presents with   Knee Pain    Right-Injection by Dr. Sharlet Salina last week for Left L5-S1 transforaminal epidural injection under fluoroscopic guidance.    This visit occurred during the SARS-CoV-2 public health emergency.  Safety protocols were in place, including screening questions prior to the visit, additional usage of staff PPE, and extensive cleaning of exam room while observing appropriate contact time as indicated for disinfecting solutions.   Subjective:   Diana Cook is a 71 y.o. very pleasant female patient with Body mass index is 29.23 kg/m. who presents with the following:  R Knee effusion.  Hurts, but not as bad.  She has some puffiness in the anterior aspect of her knee has been present for roughly 2 weeks.  It was hurting somewhat, and it was puffier more.  She did has a transforaminal injection by physical medicine and rehab last week, it does seem to improve after this.  He does not have any pain with that is significant or contributed to this at the joint line with movement twisting, going up and down stairs, or compressed flexion.  Review of Systems is noted in the HPI, as appropriate   Objective:   BP 114/66    Pulse 75    Temp 98.1 F (36.7 C) (Temporal)    Ht 5' 8.5" (1.74 m)    Wt 195 lb 1 oz (88.5 kg)    SpO2 92%    BMI 29.23 kg/m   Mild effusion.  She does have a small prepatellar bursitis. Nontender patellar facets.  Stable to varus and valgus stress.  She does have some pain at the medial greater than lateral joint line.  ACL, PCL, MCL, and LCL are intact.  She does have some pain with forced flexion, but not  severe.  Radiology: No results found.  Assessment and Plan:     ICD-10-CM   1. Acute pain of right knee  M25.561 dexamethasone (DECADRON) injection 10 mg     I wrapped the knee with an Ace bandage.  Recommended that she wear a neoprene sleeve without a doughnut over the next week to help with resorption of prepatellar bursitis.  This is outside of the intra-articular space, and I do not think that an intra-articular injection will be likely helpful.  I am going to give her some IM Decadron given the general achiness in and about this region.  She is on Eliquis, so this would bypass the GI system.    Meds ordered this encounter  Medications   dexamethasone (DECADRON) injection 10 mg   There are no discontinued medications. No orders of the defined types were placed in this encounter.   Follow-up: No follow-ups on file.  [employer] One speech-to-text software was used for transcription in this dictation.  Possible transcriptional errors can occur using Editor, commissioning.   Signed,  Maud Deed. Cionna Collantes, MD   Outpatient Encounter Medications as of 04/17/2021  Medication Sig   acetaminophen (TYLENOL) 500 MG tablet Take 500-1,000 mg by mouth every 6 (six) hours as needed for mild pain or headache.    albuterol (VENTOLIN  HFA) 108 (90 Base) MCG/ACT inhaler Inhale 2 puffs into the lungs every 4 (four) hours as needed for wheezing or shortness of breath.   apixaban (ELIQUIS) 5 MG TABS tablet Take 1 tablet (5 mg total) by mouth 2 (two) times daily.   atorvastatin (LIPITOR) 40 MG tablet Take 1 tablet by mouth once daily   Calcium Carb-Cholecalciferol (CALCIUM 600 + D PO) Take 1 tablet by mouth 2 (two) times daily.   FLUoxetine (PROZAC) 20 MG capsule Take 1 capsule by mouth once daily   furosemide (LASIX) 40 MG tablet Take 1 tablet (40 mg total) by mouth daily as needed.   levETIRAcetam (KEPPRA) 250 MG tablet Take 250 mg by mouth 2 (two) times daily.    omeprazole (PRILOSEC) 40 MG  capsule Take 40 mg by mouth daily.   OXYGEN Inhale 3 L into the lungs at bedtime.    STIOLTO RESPIMAT 2.5-2.5 MCG/ACT AERS INHALE 2 PUFFS BY MOUTH ONCE DAILY   Tiotropium Bromide-Olodaterol (STIOLTO RESPIMAT) 2.5-2.5 MCG/ACT AERS Inhale 2 puffs into the lungs daily.   tretinoin (RETIN-A) 0.1 % cream Apply topically at bedtime.   zolpidem (AMBIEN) 10 MG tablet TAKE 1/2 TO 1 (ONE-HALF TO ONE) TABLET BY MOUTH AT BEDTIME AS NEEDED FOR SLEEP   [EXPIRED] dexamethasone (DECADRON) injection 10 mg    No facility-administered encounter medications on file as of 04/17/2021.

## 2021-04-17 NOTE — Telephone Encounter (Signed)
Spoke w/ Dr. Saunders Revel to clarify the need for this appt, as Diana Cook is not transitioning from warfarin. Per Dr. Saunders Revel, ok to cancel appt. Called and spoke w/ Diana Cook.   She reports that she did not receive an appt for me and that she started Eliquis 5 mg BID on Friday. Diana Cook was started on Eliquis for PAF on April 12, 2021.    Reviewed patients medication list.  Diana Cook is not currently on any combined P-gp and strong CYP3A4 inhibitors/inducers (ketoconazole, traconazole, ritonavir, carbamazepine, phenytoin, rifampin, St. John's wort).  Reviewed labs.  SCr 0.87, Weight 92.1 kg, CrCl- 84.98.  Dose appropriate based on age, weight, and SCr.  Hgb and HCT Within Normal Limits  A full discussion of the nature of anticoagulants has been carried out.  A benefit/risk analysis has been presented to the patient, so that they understand the justification for choosing anticoagulation with Eliquis at this time.  The need for compliance is stressed.  Diana Cook is aware to take the medication twice daily.  Side effects of potential bleeding are discussed, including unusual colored urine or stools, coughing up blood or coffee ground emesis, nose bleeds or serious fall or head trauma.  Discussed signs and symptoms of stroke. The patient should avoid any OTC items containing aspirin or ibuprofen.  Avoid alcohol consumption.   Call if any signs of abnormal bleeding.  Discussed financial obligations and resolved any difficulty in obtaining medication.  Next lab test in 6 months.    Diana Cook reports a dental appt tomorrow, but it is just to have her crown placed. She will notify dentist that she is now on blood thinner.  Diana Cook is appreciative of the call and will let us know if she has any questions or concerns.

## 2021-04-25 DIAGNOSIS — J381 Polyp of vocal cord and larynx: Secondary | ICD-10-CM | POA: Diagnosis not present

## 2021-04-25 DIAGNOSIS — K219 Gastro-esophageal reflux disease without esophagitis: Secondary | ICD-10-CM | POA: Diagnosis not present

## 2021-05-08 ENCOUNTER — Encounter: Payer: Self-pay | Admitting: *Deleted

## 2021-05-08 ENCOUNTER — Encounter: Payer: Medicare Other | Attending: Pulmonary Disease | Admitting: *Deleted

## 2021-05-08 ENCOUNTER — Other Ambulatory Visit: Payer: Self-pay

## 2021-05-08 DIAGNOSIS — R0602 Shortness of breath: Secondary | ICD-10-CM | POA: Insufficient documentation

## 2021-05-08 NOTE — Progress Notes (Signed)
Initial telephone orientation completed. Diagnosis can be found in [facility] 2/7. EP orientation scheduled for Thursday 3/23 at 3pm. ?

## 2021-05-16 ENCOUNTER — Other Ambulatory Visit: Payer: Self-pay

## 2021-05-16 VITALS — Ht 68.75 in | Wt 199.7 lb

## 2021-05-16 DIAGNOSIS — R0602 Shortness of breath: Secondary | ICD-10-CM | POA: Diagnosis not present

## 2021-05-16 NOTE — Patient Instructions (Signed)
Patient Instructions ? ?Patient Details  ?Name: Diana Cook ?MRN: [id number] ?Date of Birth: 1950-03-13 ?Referring Provider:  Tyler Pita, MD ? ?Below are your personal goals for exercise, nutrition, and risk factors. Our goal is to help you stay on track towards obtaining and maintaining these goals. We will be discussing your progress on these goals with you throughout the program. ? ?Initial Exercise Prescription: ? Initial Exercise Prescription - 05/16/21 1600   ? ?  ? Date of Initial Exercise RX and Referring Provider  ? Date 05/16/21   ? Referring Provider Patsey Berthold   ?  ? Oxygen  ? Oxygen Continuous   ? Liters 3   ? Maintain Oxygen Saturation 88% or higher   ?  ? Treadmill  ? MPH 1.5   ? Grade 0   ? Minutes 15   ? METs 2   ?  ? Arm Ergometer  ? Level 1   ? RPM 25   ? Minutes 15   ? METs 2   ?  ? Recumbant Elliptical  ? Level 1   ? RPM 50   ? Minutes 15   ? METs 2   ?  ? REL-XR  ? Level 1   ? Speed 50   ? Minutes 15   ? METs 2   ?  ? Track  ? Laps 20   ? Minutes 15   ? METs 2   ?  ? Prescription Details  ? Frequency (times per week) 3   ? Duration Progress to 30 minutes of continuous aerobic without signs/symptoms of physical distress   ?  ? Intensity  ? THRR 40-80% of Max Heartrate 110-135   ? Ratings of Perceived Exertion 11-15   ? Perceived Dyspnea 0-4   ?  ? Resistance Training  ? Training Prescription Yes   ? Weight 3 lb   ? Reps 10-15   ? ?  ?  ? ?  ? ? ?Exercise Goals: ?Frequency: Be able to perform aerobic exercise two to three times per week in program working toward 2-5 days per week of home exercise. ? ?Intensity: Work with a perceived exertion of 11 (fairly light) - 15 (hard) while following your exercise prescription.  We will make changes to your prescription with you as you progress through the program. ?  ?Duration: Be able to do 30 to 45 minutes of continuous aerobic exercise in addition to a 5 minute warm-up and a 5 minute cool-down routine. ?  ?Nutrition Goals: ?Your personal nutrition  goals will be established when you do your nutrition analysis with the dietician. ? ?The following are general nutrition guidelines to follow: ?Cholesterol < 200mg /day ?Sodium < 1500mg /day ?Fiber: Women over 50 yrs - 21 grams per day ? ?Personal Goals: ? Personal Goals and Risk Factors at Admission - 05/16/21 1649   ? ?  ? Core Components/Risk Factors/Patient Goals on Admission  ?  Weight Management Yes;Weight Loss   ? Intervention Weight Management: Develop a combined nutrition and exercise program designed to reach desired caloric intake, while maintaining appropriate intake of nutrient and fiber, sodium and fats, and appropriate energy expenditure required for the weight goal.;Weight Management: Provide education and appropriate resources to help participant work on and attain dietary goals.;Weight Management/Obesity: Establish reasonable short term and long term weight goals.   ? Expected Outcomes Short Term: Continue to assess and modify interventions until short term weight is achieved;Long Term: Adherence to nutrition and physical activity/exercise program aimed toward attainment  of established weight goal;Weight Loss: Understanding of general recommendations for a balanced deficit meal plan, which promotes 1-2 lb weight loss per week and includes a negative energy balance of [phone] kcal/d;Understanding recommendations for meals to include 15-35% energy as protein, 25-35% energy from fat, 35-60% energy from carbohydrates, less than 200mg  of dietary cholesterol, 20-35 gm of total fiber daily;Understanding of distribution of calorie intake throughout the day with the consumption of 4-5 meals/snacks   ? Improve shortness of breath with ADL's Yes   ? Intervention Provide education, individualized exercise plan and daily activity instruction to help decrease symptoms of SOB with activities of daily living.   ? Expected Outcomes Short Term: Improve cardiorespiratory fitness to achieve a reduction of symptoms when  performing ADLs;Long Term: Be able to perform more ADLs without symptoms or delay the onset of symptoms   ? Increase knowledge of respiratory medications and ability to use respiratory devices properly  Yes   ? Intervention Provide education and demonstration as needed of appropriate use of medications, inhalers, and oxygen therapy.   ? Expected Outcomes Short Term: Achieves understanding of medications use. Understands that oxygen is a medication prescribed by physician. Demonstrates appropriate use of inhaler and oxygen therapy.;Long Term: Maintain appropriate use of medications, inhalers, and oxygen therapy.   ? Lipids Yes   ? Intervention Provide education and support for participant on nutrition & aerobic/resistive exercise along with prescribed medications to achieve LDL 70mg , HDL >40mg .   ? Expected Outcomes Short Term: Participant states understanding of desired cholesterol values and is compliant with medications prescribed. Participant is following exercise prescription and nutrition guidelines.;Long Term: Cholesterol controlled with medications as prescribed, with individualized exercise RX and with personalized nutrition plan. Value goals: LDL < 70mg , HDL > 40 mg.   ? ?  ?  ? ?  ? ? ?Tobacco Use Initial Evaluation: ?Social History  ? ?Tobacco Use  ?Smoking Status Former  ? Packs/day: 1.50  ? Years: 42.00  ? Pack years: 63.00  ? Types: Cigarettes  ? Quit date: 11/11/2010  ? Years since quitting: 10.5  ?Smokeless Tobacco Never  ? ? ?Exercise Goals and Review: ? Exercise Goals   ? ? [place] Name 05/16/21 1645  ?  ?  ?  ?  ?  ? Exercise Goals  ? Increase Physical Activity Yes      ? Intervention Provide advice, education, support and counseling about physical activity/exercise needs.;Develop an individualized exercise prescription for aerobic and resistive training based on initial evaluation findings, risk stratification, comorbidities and participant's personal goals.      ? Expected Outcomes Short Term:  Attend rehab on a regular basis to increase amount of physical activity.;Long Term: Add in home exercise to make exercise part of routine and to increase amount of physical activity.;Long Term: Exercising regularly at least 3-5 days a week.      ? Increase Strength and Stamina Yes      ? Intervention Provide advice, education, support and counseling about physical activity/exercise needs.;Develop an individualized exercise prescription for aerobic and resistive training based on initial evaluation findings, risk stratification, comorbidities and participant's personal goals.      ? Expected Outcomes Short Term: Increase workloads from initial exercise prescription for resistance, speed, and METs.;Short Term: Perform resistance training exercises routinely during rehab and add in resistance training at home;Long Term: Improve cardiorespiratory fitness, muscular endurance and strength as measured by increased METs and functional capacity (6MWT)      ? Able to understand and use rate  of perceived exertion (RPE) scale Yes      ? Intervention Provide education and explanation on how to use RPE scale      ? Expected Outcomes Short Term: Able to use RPE daily in rehab to express subjective intensity level;Long Term:  Able to use RPE to guide intensity level when exercising independently      ? Able to understand and use Dyspnea scale Yes      ? Intervention Provide education and explanation on how to use Dyspnea scale      ? Expected Outcomes Short Term: Able to use Dyspnea scale daily in rehab to express subjective sense of shortness of breath during exertion;Long Term: Able to use Dyspnea scale to guide intensity level when exercising independently      ? Knowledge and understanding of Target Heart Rate Range (THRR) Yes      ? Intervention Provide education and explanation of THRR including how the numbers were predicted and where they are located for reference      ? Expected Outcomes Short Term: Able to state/look up  THRR;Short Term: Able to use daily as guideline for intensity in rehab;Long Term: Able to use THRR to govern intensity when exercising independently      ? Able to check pulse independently Yes      ? Int

## 2021-05-16 NOTE — Progress Notes (Signed)
Pulmonary Individual Treatment Plan ? ?Patient Details  ?Name: Diana Cook ?MRN: [id number] ?Date of Birth: 01/08/1951 ?Referring Provider:   ?Flowsheet Row Pulmonary Rehab from 05/16/2021 in [facility] Cardiac and Pulmonary Rehab  ?Referring Provider Patsey Berthold  ? ?  ? ? ?Initial Encounter Date:  ?Flowsheet Row Pulmonary Rehab from 05/16/2021 in [facility] Cardiac and Pulmonary Rehab  ?Date 05/16/21  ? ?  ? ? ?Visit Diagnosis: Shortness of breath ? ?Patient's Home Medications on Admission: ? ?Current Outpatient Medications:  ?  acetaminophen (TYLENOL) 500 MG tablet, Take 500-1,000 mg by mouth every 6 (six) hours as needed for mild pain or headache. , Disp: , Rfl:  ?  albuterol (VENTOLIN HFA) 108 (90 Base) MCG/ACT inhaler, Inhale 2 puffs into the lungs every 4 (four) hours as needed for wheezing or shortness of breath., Disp: 18 g, Rfl: 1 ?  apixaban (ELIQUIS) 5 MG TABS tablet, Take 1 tablet (5 mg total) by mouth 2 (two) times daily., Disp: 60 tablet, Rfl: 0 ?  atorvastatin (LIPITOR) 40 MG tablet, Take 1 tablet by mouth once daily, Disp: 90 tablet, Rfl: 1 ?  Calcium Carb-Cholecalciferol (CALCIUM 600 + D PO), Take 1 tablet by mouth 2 (two) times daily., Disp: , Rfl:  ?  FLUoxetine (PROZAC) 20 MG capsule, Take 1 capsule by mouth once daily, Disp: 90 capsule, Rfl: 1 ?  furosemide (LASIX) 40 MG tablet, Take 1 tablet (40 mg total) by mouth daily as needed., Disp: 30 tablet, Rfl: 2 ?  levETIRAcetam (KEPPRA) 250 MG tablet, Take 250 mg by mouth 2 (two) times daily. , Disp: , Rfl:  ?  omeprazole (PRILOSEC) 40 MG capsule, Take 40 mg by mouth daily., Disp: , Rfl:  ?  OXYGEN, Inhale 3 L into the lungs at bedtime. , Disp: , Rfl:  ?  STIOLTO RESPIMAT 2.5-2.5 MCG/ACT AERS, INHALE 2 PUFFS BY MOUTH ONCE DAILY, Disp: 4 g, Rfl: 5 ?  Tiotropium Bromide-Olodaterol (STIOLTO RESPIMAT) 2.5-2.5 MCG/ACT AERS, Inhale 2 puffs into the lungs daily., Disp: 4 g, Rfl: 0 ?  tretinoin (RETIN-A) 0.1 % cream, Apply topically at bedtime., Disp: 45 g, Rfl: 1 ?   zolpidem (AMBIEN) 10 MG tablet, TAKE 1/2 TO 1 (ONE-HALF TO ONE) TABLET BY MOUTH AT BEDTIME AS NEEDED FOR SLEEP, Disp: 30 tablet, Rfl: 3 ? ?Past Medical History: ?Past Medical History:  ?Diagnosis Date  ? Allergic rhinitis   ? Arthritis   ? Asthma   ? as a child, mild now  ? Breast cancer ([place]) 12/2015  ? right breast cancer, lumpectomy and mammosite   ? Colon polyps   ? colonoscopy 7/08, tubular adenoma  ? Complete heart block ([place]) 09/2011  ? s/p PPM implanted in [facility]  ? COPD (chronic obstructive pulmonary disease) ([place])   ? Myocardial infarction [facility] [place]) 2011  ? Osteopenia 10/2015  ? Pacemaker   ? 2011  ? Paroxysmal atrial fibrillation ([place]) 03/2021  ? Incidentally detected on pacemaker interrogation  ? Personal history of radiation therapy 2017  ? right breast ca, mammosite placed  ? Seizure disorder ([place])   ? Seizures ([place])   ? first one was when she was 71 years old   ? Small bowel obstruction ([place])   ? 1988 and 2002  ? Tobacco abuse   ? ? ?Tobacco Use: ?Social History  ? ?Tobacco Use  ?Smoking Status Former  ? Packs/day: 1.50  ? Years: 42.00  ? Pack years: 63.00  ? Types: Cigarettes  ? Quit date: 11/11/2010  ? Years  since quitting: 10.5  ?Smokeless Tobacco Never  ? ? ?Labs: ?Review Flowsheet   ? ?  ?  Latest Ref Rng & Units 12/08/2017 12/28/2018 11/16/2019 03/09/2020  ?Labs for ITP Cardiac and Pulmonary Rehab  ?Cholestrol 0 - 200 mg/dL 167   205   136   161    ?LDL (calc) 0 - 99 mg/dL 92   124   59   72    ?HDL-C >39.00 mg/dL 55.40   63.30   57   65.90    ?Trlycerides 0.0 - 149.0 mg/dL 99.0   90.0   109   120.0    ?Hemoglobin A1c 4.6 - 6.5 % 5.7   5.6    5.8    ? ?  10/24/2020  ?Labs for ITP Cardiac and Pulmonary Rehab  ?Cholestrol   ?LDL (calc)   ?HDL-C   ?Trlycerides   ?Hemoglobin A1c 6.0    ?  ? ? Multiple values from one day are sorted in reverse-chronological order  ?  ?  ? ? ? ?Pulmonary Assessment Scores: ? Pulmonary Assessment Scores   ? ? [place] Name 05/16/21 1646  ?  ?  ?  ? ADL UCSD  ? SOB Score  total 68    ? Rest 2    ? Walk 2    ? Stairs 4    ? Bath 2    ? Dress 1    ? Shop 4    ?  ? CAT Score  ? CAT Score 26    ?  ? mMRC Score  ? mMRC Score 1    ? ?  ?  ? ?  ?  ?UCSD: ?Self-administered rating of dyspnea associated with activities of daily living (ADLs) ?6-point scale (0 = "not at all" to 5 = "maximal or unable to do because of breathlessness")  ?Scoring Scores range from 0 to 120.  Minimally important difference is 5 units ? ?CAT: ?CAT can identify the health impairment of COPD patients and is better correlated with disease progression.  ?CAT has a scoring range of zero to 40. The CAT score is classified into four groups of low (less than 10), medium (10 - 20), high (21-30) and very high (31-40) based on the impact level of disease on health status. A CAT score over 10 suggests significant symptoms.  A worsening CAT score could be explained by an exacerbation, poor medication adherence, poor inhaler technique, or progression of COPD or comorbid conditions.  ?CAT MCID is 2 points ? ?mMRC: ?mMRC (Modified Medical Research Council) Dyspnea Scale is used to assess the degree of baseline functional disability in patients of respiratory disease due to dyspnea. ?No minimal important difference is established. A decrease in score of 1 point or greater is considered a positive change.  ? ?Pulmonary Function Assessment: ? ? ?Exercise Target Goals: ?Exercise Program Goal: ?Individual exercise prescription set using results from initial 6 min walk test and THRR while considering  patient?s activity barriers and safety.  ? ?Exercise Prescription Goal: ?Initial exercise prescription builds to 30-45 minutes a day of aerobic activity, 2-3 days per week.  Home exercise guidelines will be given to patient during program as part of exercise prescription that the participant will acknowledge. ? ?Education: Aerobic Exercise: ?- Group verbal and visual presentation on the components of exercise prescription. Introduces  F.I.T.T principle from ACSM for exercise prescriptions.  Reviews F.I.T.T. principles of aerobic exercise including progression. Written material given at graduation. ? ? ?Education: Resistance Exercise: ?-  Group verbal and visual presentation on the components of exercise prescription. Introduces F.I.T.T principle from ACSM for exercise prescriptions  Reviews F.I.T.T. principles of resistance exercise including progression. Written material given at graduation. ? ?  ?Education: Exercise & Equipment Safety: ?- Individual verbal instruction and demonstration of equipment use and safety with use of the equipment. ?Flowsheet Row Pulmonary Rehab from 05/16/2021 in [facility] Cardiac and Pulmonary Rehab  ?Date 05/16/21  ?Educator AS  ?Instruction Review Code 1- Verbalizes Understanding  ? ?  ? ? ?Education: Exercise Physiology & General Exercise Guidelines: ?- Group verbal and written instruction with models to review the exercise physiology of the cardiovascular system and associated critical values. Provides general exercise guidelines with specific guidelines to those with heart or lung disease.  ? ? ?Education: Flexibility, Balance, Mind/Body Relaxation: ?- Group verbal and visual presentation with interactive activity on the components of exercise prescription. Introduces F.I.T.T principle from ACSM for exercise prescriptions. Reviews F.I.T.T. principles of flexibility and balance exercise training including progression. Also discusses the mind body connection.  Reviews various relaxation techniques to help reduce and manage stress (i.e. Deep breathing, progressive muscle relaxation, and visualization). Balance handout provided to take home. Written material given at graduation. ? ? ?Activity Barriers & Risk Stratification: ? Activity Barriers & Cardiac Risk Stratification - 05/08/21 1409   ? ?  ? Activity Barriers & Cardiac Risk Stratification  ? Activity Barriers Joint Problems;Shortness of Breath;Deconditioning;Muscular  Weakness;Back Problems   ? ?  ?  ? ?  ? ? ?6 Minute Walk: ? 6 Minute Walk   ? ? [place] Name 05/16/21 1638  ?  ?  ?  ? 6 Minute Walk  ? Phase Initial    ? Distance 1050 feet    ? Walk Time 6 minutes    ? # of Rest Breaks

## 2021-05-21 ENCOUNTER — Other Ambulatory Visit: Payer: Self-pay

## 2021-05-21 DIAGNOSIS — R0602 Shortness of breath: Secondary | ICD-10-CM

## 2021-05-21 NOTE — Progress Notes (Signed)
Daily Session Note ? ?Patient Details  ?Name: Diana Cook ?MRN: [id number] ?Date of Birth: 1951-01-05 ?Referring Provider:   ?Flowsheet Row Pulmonary Rehab from 05/16/2021 in [facility] Cardiac and Pulmonary Rehab  ?Referring Provider Patsey Berthold  ? ?  ? ? ?Encounter Date: 05/21/2021 ? ?Check In: ? Session Check In - 05/21/21 5465   ? ?  ? Check-In  ? Supervising physician immediately available to respond to emergencies See telemetry face sheet for immediately available ER MD   ? Location [facility]-Cardiac & Pulmonary Rehab   ? Staff Present Birdie Sons, MPA, RN;Jessica [place], MA, RCEP, CCRP, CCET;Amanda Sommer, BA, ACSM CEP, Exercise Physiologist   ? Virtual Visit No   ? Medication changes reported     No   ? Fall or balance concerns reported    No   ? Warm-up and Cool-down Performed on first and last piece of equipment   ? Resistance Training Performed Yes   ? VAD Patient? No   ? PAD/SET Patient? No   ?  ? Pain Assessment  ? Currently in Pain? No/denies   ? ?  ?  ? ?  ? ? ? ? ? ?Social History  ? ?Tobacco Use  ?Smoking Status Former  ? Packs/day: 1.50  ? Years: 42.00  ? Pack years: 63.00  ? Types: Cigarettes  ? Quit date: 11/11/2010  ? Years since quitting: 10.5  ?Smokeless Tobacco Never  ? ? ?Goals Met:  ?Independence with exercise equipment ?Exercise tolerated well ?No report of concerns or symptoms today ?Strength training completed today ? ?Goals Unmet:  ?Not Applicable ? ?Comments: First full day of exercise!  Patient was oriented to gym and equipment including functions, settings, policies, and procedures.  Patient's individual exercise prescription and treatment plan were reviewed.  All starting workloads were established based on the results of the 6 minute walk test done at initial orientation visit.  The plan for exercise progression was also introduced and progression will be customized based on patient's performance and goals. ? ? ? ? ?Dr. Emily Filbert is Medical Director for [place].   ?Dr. Ottie Glazier is Medical Director for [facility] Pulmonary Rehabilitation. ?

## 2021-05-22 DIAGNOSIS — Z20822 Contact with and (suspected) exposure to covid-19: Secondary | ICD-10-CM | POA: Diagnosis not present

## 2021-05-23 DIAGNOSIS — Z20828 Contact with and (suspected) exposure to other viral communicable diseases: Secondary | ICD-10-CM | POA: Diagnosis not present

## 2021-05-23 DIAGNOSIS — R0602 Shortness of breath: Secondary | ICD-10-CM | POA: Diagnosis not present

## 2021-05-23 DIAGNOSIS — Z20822 Contact with and (suspected) exposure to covid-19: Secondary | ICD-10-CM | POA: Diagnosis not present

## 2021-05-23 NOTE — Progress Notes (Signed)
Daily Session Note ? ?Patient Details  ?Name: Diana Cook ?MRN: [id number] ?Date of Birth: 22-Dec-1950 ?Referring Provider:   ?Flowsheet Row Pulmonary Rehab from 05/16/2021 in [facility] Cardiac and Pulmonary Rehab  ?Referring Provider Patsey Berthold  ? ?  ? ? ?Encounter Date: 05/23/2021 ? ?Check In: ? Session Check In - 05/23/21 0930   ? ?  ? Check-In  ? Supervising physician immediately available to respond to emergencies See telemetry face sheet for immediately available ER MD   ? Location [facility]-Cardiac & Pulmonary Rehab   ? Staff Present Birdie Sons, MPA, RN;Melissa Gaston, RDN, Tawanna Solo, MS, ASCM CEP, Exercise Physiologist;Amanda Oletta Darter, BA, ACSM CEP, Exercise Physiologist   ? Virtual Visit No   ? Medication changes reported     No   ? Fall or balance concerns reported    No   ? Warm-up and Cool-down Performed on first and last piece of equipment   ? Resistance Training Performed Yes   ? VAD Patient? No   ? PAD/SET Patient? No   ?  ? Pain Assessment  ? Currently in Pain? No/denies   ? ?  ?  ? ?  ? ? ? ? ? ?Social History  ? ?Tobacco Use  ?Smoking Status Former  ? Packs/day: 1.50  ? Years: 42.00  ? Pack years: 63.00  ? Types: Cigarettes  ? Quit date: 11/11/2010  ? Years since quitting: 10.5  ?Smokeless Tobacco Never  ? ? ?Goals Met:  ?Independence with exercise equipment ?Exercise tolerated well ?No report of concerns or symptoms today ?Strength training completed today ? ?Goals Unmet:  ?Not Applicable ? ?Comments: Pt able to follow exercise prescription today without complaint.  Will continue to monitor for progression. ? ? ? ?Dr. Emily Filbert is Medical Director for [place].  ?Dr. Ottie Glazier is Medical Director for [facility] Pulmonary Rehabilitation. ?

## 2021-05-24 ENCOUNTER — Encounter: Payer: Medicare Other | Admitting: *Deleted

## 2021-05-24 DIAGNOSIS — R0602 Shortness of breath: Secondary | ICD-10-CM

## 2021-05-24 NOTE — Progress Notes (Signed)
Daily Session Note ? ?Patient Details  ?Name: Diana Cook ?MRN: [id number] ?Date of Birth: [birth year] ?Referring Provider:   ?Flowsheet Row Pulmonary Rehab from 05/16/2021 in [facility] Cardiac and Pulmonary Rehab  ?Referring Provider Patsey Berthold  ? ?  ? ? ?Encounter Date: 05/24/2021 ? ?Check In: ? Session Check In - 05/24/21 0927   ? ?  ? Check-In  ? Supervising physician immediately available to respond to emergencies See telemetry face sheet for immediately available ER MD   ? Location [facility]-Cardiac & Pulmonary Rehab   ? Staff Present Renita Papa, RN BSN;Joseph Willow, RCP,RRT,BSRT;Melissa [place], [place], LDN   ? Virtual Visit No   ? Medication changes reported     No   ? Fall or balance concerns reported    No   ? Warm-up and Cool-down Performed on first and last piece of equipment   ? Resistance Training Performed Yes   ? VAD Patient? No   ? PAD/SET Patient? No   ?  ? Pain Assessment  ? Currently in Pain? No/denies   ? ?  ?  ? ?  ? ? ? ? ? ?Social History  ? ?Tobacco Use  ?Smoking Status Former  ? Packs/day: 1.50  ? Years: 42.00  ? Pack years: 63.00  ? Types: Cigarettes  ? Quit date: 11/11/2010  ? Years since quitting: 10.5  ?Smokeless Tobacco Never  ? ? ?Goals Met:  ?Independence with exercise equipment ?Exercise tolerated well ?No report of concerns or symptoms today ?Strength training completed today ? ?Goals Unmet:  ?Not Applicable ? ?Comments: Pt able to follow exercise prescription today without complaint.  Will continue to monitor for progression. ? ? ? ?Dr. Emily Filbert is Medical Director for [place].  ?Dr. Ottie Glazier is Medical Director for [facility] Pulmonary Rehabilitation. ?

## 2021-05-28 ENCOUNTER — Encounter: Payer: Medicare Other | Attending: Pulmonary Disease

## 2021-05-28 DIAGNOSIS — R0602 Shortness of breath: Secondary | ICD-10-CM | POA: Diagnosis not present

## 2021-05-28 NOTE — Progress Notes (Signed)
Completed initial RD consultation ?

## 2021-05-28 NOTE — Progress Notes (Signed)
Daily Session Note ? ?Patient Details  ?Name: Diana Cook ?MRN: [id number] ?Date of Birth: 01-21-1951 ?Referring Provider:   ?Flowsheet Row Pulmonary Rehab from 05/16/2021 in [facility] Cardiac and Pulmonary Rehab  ?Referring Provider Patsey Berthold  ? ?  ? ? ?Encounter Date: 05/28/2021 ? ?Check In: ? Session Check In - 05/28/21 0915   ? ?  ? Check-In  ? Supervising physician immediately available to respond to emergencies See telemetry face sheet for immediately available ER MD   ? Location [facility]-Cardiac & Pulmonary Rehab   ? Staff Present Birdie Sons, MPA, RN;Jessica [place], MA, RCEP, CCRP, CCET;Amanda Sommer, BA, ACSM CEP, Exercise Physiologist   ? Virtual Visit No   ? Medication changes reported     No   ? Fall or balance concerns reported    No   ? Warm-up and Cool-down Performed on first and last piece of equipment   ? Resistance Training Performed Yes   ? VAD Patient? No   ? PAD/SET Patient? No   ?  ? Pain Assessment  ? Currently in Pain? No/denies   ? ?  ?  ? ?  ? ? ? ? ? ?Social History  ? ?Tobacco Use  ?Smoking Status Former  ? Packs/day: 1.50  ? Years: 42.00  ? Pack years: 63.00  ? Types: Cigarettes  ? Quit date: 11/11/2010  ? Years since quitting: 10.5  ?Smokeless Tobacco Never  ? ? ?Goals Met:  ?Independence with exercise equipment ?Exercise tolerated well ?No report of concerns or symptoms today ?Strength training completed today ? ?Goals Unmet:  ?Not Applicable ? ?Comments: Pt able to follow exercise prescription today without complaint.  Will continue to monitor for progression. ? ? ? ?Dr. Emily Filbert is Medical Director for [place].  ?Dr. Ottie Glazier is Medical Director for [facility] Pulmonary Rehabilitation. ?

## 2021-05-29 DIAGNOSIS — K219 Gastro-esophageal reflux disease without esophagitis: Secondary | ICD-10-CM | POA: Diagnosis not present

## 2021-05-29 DIAGNOSIS — R059 Cough, unspecified: Secondary | ICD-10-CM | POA: Diagnosis not present

## 2021-05-29 DIAGNOSIS — J381 Polyp of vocal cord and larynx: Secondary | ICD-10-CM | POA: Diagnosis not present

## 2021-05-30 DIAGNOSIS — R0602 Shortness of breath: Secondary | ICD-10-CM | POA: Diagnosis not present

## 2021-05-30 DIAGNOSIS — Z20822 Contact with and (suspected) exposure to covid-19: Secondary | ICD-10-CM | POA: Diagnosis not present

## 2021-05-30 NOTE — Progress Notes (Signed)
Daily Session Note ? ?Patient Details  ?Name: Diana Cook ?MRN: [id number] ?Date of Birth: September 09, 1950 ?Referring Provider:   ?Flowsheet Row Pulmonary Rehab from 05/16/2021 in [facility] Cardiac and Pulmonary Rehab  ?Referring Provider Patsey Berthold  ? ?  ? ? ?Encounter Date: 05/30/2021 ? ?Check In: ? Session Check In - 05/30/21 0921   ? ?  ? Check-In  ? Supervising physician immediately available to respond to emergencies See telemetry face sheet for immediately available ER MD   ? Location [facility]-Cardiac & Pulmonary Rehab   ? Staff Present Birdie Sons, MPA, RN;Amanda Sommer, BA, ACSM CEP, Exercise Physiologist;Asaad Gulley Amedeo Plenty, BS, ACSM CEP, Exercise Physiologist;Jessica [place], MA, RCEP, CCRP, CCET   ? Virtual Visit No   ? Medication changes reported     No   ? Fall or balance concerns reported    No   ? Warm-up and Cool-down Performed on first and last piece of equipment   ? Resistance Training Performed Yes   ? VAD Patient? No   ? PAD/SET Patient? No   ?  ? Pain Assessment  ? Currently in Pain? No/denies   ? ?  ?  ? ?  ? ? ? ? ? ?Social History  ? ?Tobacco Use  ?Smoking Status Former  ? Packs/day: 1.50  ? Years: 42.00  ? Pack years: 63.00  ? Types: Cigarettes  ? Quit date: 11/11/2010  ? Years since quitting: 10.5  ?Smokeless Tobacco Never  ? ? ?Goals Met:  ?Independence with exercise equipment ?Exercise tolerated well ?No report of concerns or symptoms today ?Strength training completed today ? ?Goals Unmet:  ?Not Applicable ? ?Comments: Pt able to follow exercise prescription today without complaint.  Will continue to monitor for progression. ? ? ? ?Dr. Emily Filbert is Medical Director for [place].  ?Dr. Ottie Glazier is Medical Director for [facility] Pulmonary Rehabilitation. ?

## 2021-05-31 ENCOUNTER — Encounter: Payer: Medicare Other | Admitting: *Deleted

## 2021-05-31 DIAGNOSIS — R0602 Shortness of breath: Secondary | ICD-10-CM | POA: Diagnosis not present

## 2021-05-31 NOTE — Progress Notes (Signed)
Daily Session Note ? ?Patient Details  ?Name: [name] ?MRN: [id number] ?Date of Birth: Apr 19, 1950 ?Referring Provider:   ?Flowsheet Row Pulmonary Rehab from 05/16/2021 in [facility] Cardiac and Pulmonary Rehab  ?Referring Provider Patsey Berthold  ? ?  ? ? ?Encounter Date: 05/31/2021 ? ?Check In: ? Session Check In - 05/31/21 0932   ? ?  ? Check-In  ? Supervising physician immediately available to respond to emergencies See telemetry face sheet for immediately available ER MD   ? Location [facility]-Cardiac & Pulmonary Rehab   ? Staff Present Renita Papa, RN BSN;Joseph Hood, RCP,RRT,BSRT;Laureen [place], [place], RRT, CPFT   ? Virtual Visit No   ? Medication changes reported     No   ? Fall or balance concerns reported    No   ? Warm-up and Cool-down Performed on first and last piece of equipment   ? Resistance Training Performed Yes   ? VAD Patient? No   ? PAD/SET Patient? No   ?  ? Pain Assessment  ? Currently in Pain? No/denies   ? ?  ?  ? ?  ? ? ? ? ? ?Social History  ? ?Tobacco Use  ?Smoking Status Former  ? Packs/day: 1.50  ? Years: 42.00  ? Pack years: 63.00  ? Types: Cigarettes  ? Quit date: 11/11/2010  ? Years since quitting: 10.5  ?Smokeless Tobacco Never  ? ? ?Goals Met:  ?Independence with exercise equipment ?Exercise tolerated well ?No report of concerns or symptoms today ?Strength training completed today ? ?Goals Unmet:  ?Not Applicable ? ?Comments: Pt able to follow exercise prescription today without complaint.  Will continue to monitor for progression. ? ? ? ?Dr. Emily Filbert is Medical Director for [place].  ?Dr. Ottie Glazier is Medical Director for [facility] Pulmonary Rehabilitation. ?

## 2021-06-04 DIAGNOSIS — R0602 Shortness of breath: Secondary | ICD-10-CM | POA: Diagnosis not present

## 2021-06-04 NOTE — Progress Notes (Signed)
Daily Session Note ? ?Patient Details  ?Name: Diana Cook ?MRN: [id number] ?Date of Birth: August 22, 1950 ?Referring Provider:   ?Flowsheet Row Pulmonary Rehab from 05/16/2021 in [facility] Cardiac and Pulmonary Rehab  ?Referring Provider Patsey Berthold  ? ?  ? ? ?Encounter Date: 06/04/2021 ? ?Check In: ? Session Check In - 06/04/21 0933   ? ?  ? Check-In  ? Supervising physician immediately available to respond to emergencies See telemetry face sheet for immediately available ER MD   ? Location [facility]-Cardiac & Pulmonary Rehab   ? Staff Present Birdie Sons, MPA, RN;Melissa Kempton, RDN, Rowe Pavy, BA, ACSM CEP, Exercise Physiologist   ? Virtual Visit No   ? Medication changes reported     No   ? Fall or balance concerns reported    No   ? Warm-up and Cool-down Performed on first and last piece of equipment   ? Resistance Training Performed Yes   ? VAD Patient? No   ? PAD/SET Patient? No   ?  ? Pain Assessment  ? Currently in Pain? No/denies   ? ?  ?  ? ?  ? ? ? ? ? ?Social History  ? ?Tobacco Use  ?Smoking Status Former  ? Packs/day: 1.50  ? Years: 42.00  ? Pack years: 63.00  ? Types: Cigarettes  ? Quit date: 11/11/2010  ? Years since quitting: 10.5  ?Smokeless Tobacco Never  ? ? ?Goals Met:  ?Independence with exercise equipment ?Exercise tolerated well ?No report of concerns or symptoms today ?Strength training completed today ? ?Goals Unmet:  ?Not Applicable ? ?Comments: Pt able to follow exercise prescription today without complaint.  Will continue to monitor for progression. ? ? ? ?Dr. Emily Filbert is Medical Director for [place].  ?Dr. Ottie Glazier is Medical Director for [facility] Pulmonary Rehabilitation. ?

## 2021-06-06 ENCOUNTER — Ambulatory Visit (INDEPENDENT_AMBULATORY_CARE_PROVIDER_SITE_OTHER): Payer: Medicare Other

## 2021-06-06 ENCOUNTER — Ambulatory Visit (INDEPENDENT_AMBULATORY_CARE_PROVIDER_SITE_OTHER): Payer: Medicare Other | Admitting: Pulmonary Disease

## 2021-06-06 ENCOUNTER — Encounter: Payer: Self-pay | Admitting: Pulmonary Disease

## 2021-06-06 ENCOUNTER — Encounter: Payer: Medicare Other | Admitting: *Deleted

## 2021-06-06 VITALS — BP 94/56 | HR 83 | Temp 98.0°F | Ht 69.0 in | Wt 200.0 lb

## 2021-06-06 DIAGNOSIS — R911 Solitary pulmonary nodule: Secondary | ICD-10-CM

## 2021-06-06 DIAGNOSIS — J432 Centrilobular emphysema: Secondary | ICD-10-CM | POA: Diagnosis not present

## 2021-06-06 DIAGNOSIS — I442 Atrioventricular block, complete: Secondary | ICD-10-CM

## 2021-06-06 DIAGNOSIS — Z87891 Personal history of nicotine dependence: Secondary | ICD-10-CM | POA: Diagnosis not present

## 2021-06-06 DIAGNOSIS — J9611 Chronic respiratory failure with hypoxia: Secondary | ICD-10-CM | POA: Diagnosis not present

## 2021-06-06 DIAGNOSIS — R0602 Shortness of breath: Secondary | ICD-10-CM

## 2021-06-06 LAB — CUP PACEART REMOTE DEVICE CHECK
Battery Remaining Longevity: 150 mo
Battery Remaining Percentage: 100 %
Brady Statistic RA Percent Paced: 3 %
Brady Statistic RV Percent Paced: 100 %
Date Time Interrogation Session: 20230413011000
Implantable Lead Implant Date: 20130830
Implantable Lead Implant Date: 20130830
Implantable Lead Location: 753859
Implantable Lead Location: 753860
Implantable Lead Model: 4456
Implantable Lead Model: 4479
Implantable Lead Serial Number: 473325
Implantable Lead Serial Number: 523784
Implantable Pulse Generator Implant Date: 20211011
Lead Channel Impedance Value: 436 Ohm
Lead Channel Impedance Value: 630 Ohm
Lead Channel Pacing Threshold Amplitude: 0.4 V
Lead Channel Pacing Threshold Pulse Width: 0.4 ms
Lead Channel Setting Pacing Amplitude: 2 V
Lead Channel Setting Pacing Amplitude: 2 V
Lead Channel Setting Pacing Pulse Width: 0.4 ms
Lead Channel Setting Sensing Sensitivity: 2.5 mV
Pulse Gen Serial Number: 949303

## 2021-06-06 NOTE — Patient Instructions (Addendum)
I am glad you doing well with your rehab. ? ?Look at resistance bands to do muscle strengthening.  These are safer than weights.  You can find these on [place] or similar places.  I would recommend a loop style bands.  You can ask at the pulmonary rehab if someone can show you how to do a simple workout with those. ? ?We will see you in follow-up in 4 months time.  Call sooner should any new problems arise. ? ?

## 2021-06-06 NOTE — Progress Notes (Signed)
? ?Subjective:  ? ? Patient ID: Diana Cook, female    DOB: [birth year], 71 y.o.   MRN: [id number] ?Patient Care Team: ?Tower, Wynelle Fanny, MD as PCP - General ?End, Harrell Gave, MD as PCP - Cardiology (Cardiology) ?Deboraha Sprang, MD as Consulting Physician (Cardiology) ?Lorelee Cover., MD as Referring Physician (Ophthalmology) ?Tower, Wynelle Fanny, MD as Consulting Physician (Family Medicine) ?[name], MD (General Surgery) ? ?Chief Complaint  ?Patient presents with  ? Follow-up  ?  Has taken Tessalon for cough.  Went to see referral.  ? ?HPI ?The patient is a 71 year old former smoker (quit 2012, 42 PVY) who follows here for the issue of dyspnea on exertion in the setting of emphysema with preserved FEV1.  She also has chronic respiratory failure with hypoxia related to emphysema.  This is a scheduled follow-up visit.  Last seen on 02 April 2021, at that time she was referred to pulmonary rehab.  She is compliant with oxygen with activity and sleep.  She continues to do well with this.  Since she started rehab she has noted her dyspnea is markedly better.  She feels "stronger".  She has a prior history of complete heart block and is status post pacemaker placement had pacer revision in October 2021 by Dr. Caryl Comes.  On today's device check she was noted to have some atrial arrhythmias and PAF and has been now started on Eliquis.  She is compliant with Stiolto and as needed albuterol.  Has not had any fevers, chills or sweats.  No calf tenderness or lower extremity edema.  No sleep disturbance.  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  She is not on inhaled corticosteroids.  She is being managed with PPI for gastroesophageal reflux.  Dr. Richardson Landry, ENT, did give her some Tessalon for occasional cough.  She has only had to take this twice.  Does  not endorse any other symptomatology today and as noted her dyspnea on exertion is better since she has been enrolled in pulmonary rehab. ? ?Review of Systems ?A 10 point review of systems was performed and it is as noted above otherwise negative. ?   ?Objective:  ? Physical Exam ?BP (!) 94/56 (BP Location: Right Arm, Patient Position: Sitting, Cuff Size: Small)   Pulse 83   Temp 98 ?F (36.7 ?C) (Oral)   Ht 5\' 9"  (1.753 m)   Wt 200 lb (90.7 kg)   SpO2 92%   BMI 29.53 kg/m?  ?GENERAL: Well-developed well-nourished woman in no acute respiratory distress.  She is fully ambulatory.  No conversational dyspnea.  Does sound hoarse. ?HEAD: Normocephalic, atraumatic. ?EYES: Pupils equal, round, reactive to light.  No scleral icterus. ?MOUTH: Nose/mouth/throat not examined due to masking requirements for COVID 19. ?NECK: Supple. No thyromegaly. No nodules. No JVD.  Trachea midline. ?PULMONARY: Symmetrical air entry, clear with no adventitious sounds ?CARDIOVASCULAR: S1 and S2. Regular rate and rhythm. No rubs murmurs gallops heard. ?GASTROINTESTINAL: Nondistended. ?MUSCULOSKELETAL: No joint deformity, no clubbing, no edema. ?NEUROLOGIC: Awake, alert, oriented x4, no focal deficits. ?SKIN: Intact,warm,dry, on limited exam no rashes. ?PSYCH: Normal mood and behavior. ?  ?   ?Assessment & Plan:  ? ?  ICD-10-CM   ?1. Centrilobular emphysema ([place])  J43.2   ? Continue Stiolto and as needed albuterol ?Continue pulmonary rehab  ?  ?  2. Chronic respiratory failure with hypoxia ([place])  J96.11   ? Continue oxygen at 2 L/min with activity and rest and 3-1/2 L at nighttime  ?  ?3. SOB (shortness of breath) on exertion  R06.02   ? Markedly improved with pulmonary rehab ?Complete pulmonary rehab program  ?  ?4. Lung nodule seen on imaging study  R91.1   ? Has upcoming CT chest pending for follow-up  ?  ?5. Ex-heavy cigarette smoker (20-39 per day)  Z87.891   ? Remains abstinent of cigarettes ?No evidence of relapse  ?  ? ?We will see  the patient in follow-up in 4 months time she is to contact us prior to that time should any new difficulties arise. ? ?C. Derrill Kay, MD ?Advanced Bronchoscopy ?[facility] ? ? ? ?*This note was dictated using voice recognition software/Dragon.  Despite best efforts to proofread, errors can occur which can change the meaning. Any transcriptional errors that result from this process are unintentional and may not be fully corrected at the time of dictation. ? ?

## 2021-06-06 NOTE — Progress Notes (Signed)
Daily Session Note ? ?Patient Details  ?Name: Diana Cook ?MRN: [id number] ?Date of Birth: 31-Oct-1950 ?Referring Provider:   ?Flowsheet Row Pulmonary Rehab from 05/16/2021 in [facility] Cardiac and Pulmonary Rehab  ?Referring Provider Patsey Berthold  ? ?  ? ? ?Encounter Date: 06/06/2021 ? ?Check In: ? Session Check In - 06/06/21 0930   ? ?  ? Check-In  ? Supervising physician immediately available to respond to emergencies See telemetry face sheet for immediately available ER MD   ? Location [facility]-Cardiac & Pulmonary Rehab   ? Staff Present Nyoka Cowden, RN, BSN, [place], BS, ACSM CEP, Exercise Physiologist;Melissa [place], RDN, LDN;Susanne Bice, RN, BSN, CCRP   ? Virtual Visit No   ? Medication changes reported     No   ? Fall or balance concerns reported    No   ? Tobacco Cessation No Change   ? Warm-up and Cool-down Performed on first and last piece of equipment   ? Resistance Training Performed Yes   ? VAD Patient? No   ?  ? Pain Assessment  ? Currently in Pain? No/denies   ? ?  ?  ? ?  ? ? ? ? ? ?Social History  ? ?Tobacco Use  ?Smoking Status Former  ? Packs/day: 1.50  ? Years: 42.00  ? Pack years: 63.00  ? Types: Cigarettes  ? Quit date: 11/11/2010  ? Years since quitting: 10.5  ?Smokeless Tobacco Never  ? ? ?Goals Met:  ?Independence with exercise equipment ?Exercise tolerated well ?No report of concerns or symptoms today ? ?Goals Unmet:  ?Not Applicable ? ?Comments: Pt able to follow exercise prescription today without complaint.  Will continue to monitor for progression.  ?Dr. Emily Filbert is Medical Director for [place].  ?Dr. Ottie Glazier is Medical Director for [facility] Pulmonary Rehabilitation. ?

## 2021-06-07 ENCOUNTER — Encounter: Payer: Medicare Other | Admitting: *Deleted

## 2021-06-07 DIAGNOSIS — R0602 Shortness of breath: Secondary | ICD-10-CM

## 2021-06-07 NOTE — Progress Notes (Signed)
Daily Session Note ? ?Patient Details  ?Name: Diana Cook ?MRN: [id number] ?Date of Birth: 08/25/1950 ?Referring Provider:   ?Flowsheet Row Pulmonary Rehab from 05/16/2021 in [facility] Cardiac and Pulmonary Rehab  ?Referring Provider Patsey Berthold  ? ?  ? ? ?Encounter Date: 06/07/2021 ? ?Check In: ? Session Check In - 06/07/21 0924   ? ?  ? Check-In  ? Supervising physician immediately available to respond to emergencies See telemetry face sheet for immediately available ER MD   ? Location [facility]-Cardiac & Pulmonary Rehab   ? Staff Present Renita Papa, RN BSN;Joseph [place], RCP,RRT,BSRT;Melissa [place], [place], LDN   ? Virtual Visit No   ? Medication changes reported     No   ? Fall or balance concerns reported    No   ? Warm-up and Cool-down Performed on first and last piece of equipment   ? Resistance Training Performed Yes   ? VAD Patient? No   ? PAD/SET Patient? No   ?  ? Pain Assessment  ? Currently in Pain? No/denies   ? ?  ?  ? ?  ? ? ? ? ? ?Social History  ? ?Tobacco Use  ?Smoking Status Former  ? Packs/day: 1.50  ? Years: 42.00  ? Pack years: 63.00  ? Types: Cigarettes  ? Quit date: 11/11/2010  ? Years since quitting: 10.5  ?Smokeless Tobacco Never  ? ? ?Goals Met:  ?Independence with exercise equipment ?Exercise tolerated well ?No report of concerns or symptoms today ?Strength training completed today ? ?Goals Unmet:  ?Not Applicable ? ?Comments: Pt able to follow exercise prescription today without complaint.  Will continue to monitor for progression. ? ? ? ?Dr. Emily Filbert is Medical Director for [place].  ?Dr. Ottie Glazier is Medical Director for [facility] Pulmonary Rehabilitation. ?

## 2021-06-08 ENCOUNTER — Other Ambulatory Visit: Payer: Self-pay | Admitting: Internal Medicine

## 2021-06-10 ENCOUNTER — Telehealth: Payer: Self-pay | Admitting: Internal Medicine

## 2021-06-10 NOTE — Telephone Encounter (Signed)
?*  STAT* If patient is at the pharmacy, call can be transferred to refill team. ? ? ?1. Which medications need to be refilled? (please list name of each medication and dose if known)  ?apixaban (ELIQUIS) 5 MG TABS tablet ? ? ?2. Which pharmacy/location (including street and city if local pharmacy) is medication to be sent to? ?[place], [place] ? ?3. Do they need a 30 day or 90 day supply? 90 with refills  ? ? ?Patient would prefer to get 90 days to save money if possible  ?

## 2021-06-10 NOTE — Telephone Encounter (Signed)
Prescription refill request for Eliquis received. ?Indication:Afib ?Last office visit:2/23 ?Scr:0.8 ?Age: 71 ?Weight:90.7 kg ? ?Prescription refilled ? ?

## 2021-06-10 NOTE — Telephone Encounter (Signed)
Already refilled this morning.

## 2021-06-10 NOTE — Telephone Encounter (Signed)
Refill request

## 2021-06-11 ENCOUNTER — Telehealth: Payer: Self-pay | Admitting: Internal Medicine

## 2021-06-11 DIAGNOSIS — R0602 Shortness of breath: Secondary | ICD-10-CM

## 2021-06-11 NOTE — Telephone Encounter (Signed)
Patient is requesting sample of Eliquis waiting on Pharmacy, please assist ?

## 2021-06-11 NOTE — Telephone Encounter (Signed)
Medication Samples have been provided to the patient. ? ?Drug name: Eliquis        Strength: 5 mg         Qty: 2 boxes  LOT: [id number]  Exp.Date: 03/25 ? ?[place] ?9:36 AM ?06/11/2021 ? ?

## 2021-06-11 NOTE — Progress Notes (Signed)
Incomplete Session Note ? ?Patient Details  ?Name: Diana Cook ?MRN: [id number] ?Date of Birth: 11-09-1950 ?Referring Provider:   ?Flowsheet Row Pulmonary Rehab from 05/16/2021 in [facility] Cardiac and Pulmonary Rehab  ?Referring Provider Patsey Berthold  ? ?  ? ? ?Diana Cook did not complete her rehab session.  Jahdai let us know that she hasn't taken her eliquis in 2 days and we advised her not to exercise today. She was encouraged to talk to her doctor today about her eliquis. She stated understanding and that she was going to go to his office today. ?

## 2021-06-12 ENCOUNTER — Encounter: Payer: Self-pay | Admitting: *Deleted

## 2021-06-12 DIAGNOSIS — R0602 Shortness of breath: Secondary | ICD-10-CM

## 2021-06-12 NOTE — Progress Notes (Signed)
Pulmonary Individual Treatment Plan ? ?Patient Details  ?Name: Diana Cook ?MRN: [id number] ?Date of Birth: [birth year] ?Referring Provider:   ?Flowsheet Row Pulmonary Rehab from 05/16/2021 in [facility] Cardiac and Pulmonary Rehab  ?Referring Provider Patsey Berthold  ? ?  ? ? ?Initial Encounter Date:  ?Flowsheet Row Pulmonary Rehab from 05/16/2021 in [facility] Cardiac and Pulmonary Rehab  ?Date 05/16/21  ? ?  ? ? ?Visit Diagnosis: Shortness of breath ? ?Patient's Home Medications on Admission: ? ?Current Outpatient Medications:  ?  acetaminophen (TYLENOL) 500 MG tablet, Take 500-1,000 mg by mouth every 6 (six) hours as needed for mild pain or headache. , Disp: , Rfl:  ?  albuterol (VENTOLIN HFA) 108 (90 Base) MCG/ACT inhaler, Inhale 2 puffs into the lungs every 4 (four) hours as needed for wheezing or shortness of breath., Disp: 18 g, Rfl: 1 ?  apixaban (ELIQUIS) 5 MG TABS tablet, Take 1 tablet by mouth twice daily, Disp: 60 tablet, Rfl: 5 ?  atorvastatin (LIPITOR) 40 MG tablet, Take 1 tablet by mouth once daily, Disp: 90 tablet, Rfl: 1 ?  benzonatate (TESSALON) 100 MG capsule, Take by mouth., Disp: , Rfl:  ?  Calcium Carb-Cholecalciferol (CALCIUM 600 + D PO), Take 1 tablet by mouth 2 (two) times daily., Disp: , Rfl:  ?  FLUoxetine (PROZAC) 20 MG capsule, Take 1 capsule by mouth once daily, Disp: 90 capsule, Rfl: 1 ?  furosemide (LASIX) 40 MG tablet, Take 1 tablet (40 mg total) by mouth daily as needed., Disp: 30 tablet, Rfl: 2 ?  levETIRAcetam (KEPPRA) 250 MG tablet, Take 250 mg by mouth 2 (two) times daily. , Disp: , Rfl:  ?  omeprazole (PRILOSEC) 40 MG capsule, Take 40 mg by mouth daily., Disp: , Rfl:  ?  OXYGEN, Inhale 3 L into the lungs at bedtime. , Disp: , Rfl:  ?  STIOLTO RESPIMAT 2.5-2.5 MCG/ACT AERS, INHALE 2 PUFFS BY MOUTH ONCE DAILY, Disp: 4 g, Rfl: 5 ?  Tiotropium Bromide-Olodaterol (STIOLTO RESPIMAT) 2.5-2.5 MCG/ACT AERS, Inhale 2 puffs into the lungs daily., Disp: 4 g, Rfl: 0 ?  tretinoin (RETIN-A) 0.1 % cream, Apply  topically at bedtime., Disp: 45 g, Rfl: 1 ?  zolpidem (AMBIEN) 10 MG tablet, TAKE 1/2 TO 1 (ONE-HALF TO ONE) TABLET BY MOUTH AT BEDTIME AS NEEDED FOR SLEEP, Disp: 30 tablet, Rfl: 3 ? ?Past Medical History: ?Past Medical History:  ?Diagnosis Date  ? Allergic rhinitis   ? Arthritis   ? Asthma   ? as a child, mild now  ? Breast cancer ([place]) 12/2015  ? right breast cancer, lumpectomy and mammosite   ? Colon polyps   ? colonoscopy 7/08, tubular adenoma  ? Complete heart block ([place]) 09/2011  ? s/p PPM implanted in [facility]  ? COPD (chronic obstructive pulmonary disease) ([place])   ? Myocardial infarction [facility]) 2011  ? Osteopenia 10/2015  ? Pacemaker   ? 2011  ? Paroxysmal atrial fibrillation ([place]) 03/2021  ? Incidentally detected on pacemaker interrogation  ? Personal history of radiation therapy 2017  ? right breast ca, mammosite placed  ? Seizure disorder ([place])   ? Seizures ([place])   ? first one was when she was 71 years old   ? Small bowel obstruction ([place])   ? 1988 and 2002  ? Tobacco abuse   ? ? ?Tobacco Use: ?Social History  ? ?Tobacco Use  ?Smoking Status Former  ? Packs/day: 1.50  ? Years: 42.00  ? Pack years: 63.00  ? Types:  Cigarettes  ? Quit date: 11/11/2010  ? Years since quitting: 10.5  ?Smokeless Tobacco Never  ? ? ?Labs: ?Review Flowsheet   ? ?  ?  Latest Ref Rng & Units 12/08/2017 12/28/2018 11/16/2019 03/09/2020  ?Labs for [facility] Cardiac and Pulmonary Rehab  ?Cholestrol 0 - 200 mg/dL 167   205   136   161    ?LDL (calc) 0 - 99 mg/dL 92   124   59   72    ?HDL-C >39.00 mg/dL 55.40   63.30   57   65.90    ?Trlycerides 0.0 - 149.0 mg/dL 99.0   90.0   109   120.0    ?Hemoglobin A1c 4.6 - 6.5 % 5.7   5.6    5.8    ? ?  10/24/2020  ?Labs for [facility] Cardiac and Pulmonary Rehab  ?Cholestrol   ?LDL (calc)   ?HDL-C   ?Trlycerides   ?Hemoglobin A1c 6.0    ?  ? ? Multiple values from one day are sorted in reverse-chronological order  ?  ?  ? ? ? ?Pulmonary Assessment Scores: ? Pulmonary Assessment Scores   ? ? [place] Name  05/16/21 1646  ?  ?  ?  ? ADL UCSD  ? SOB Score total 68    ? Rest 2    ? Walk 2    ? Stairs 4    ? Bath 2    ? Dress 1    ? Shop 4    ?  ? CAT Score  ? CAT Score 26    ?  ? mMRC Score  ? mMRC Score 1    ? ?  ?  ? ?  ?  ?UCSD: ?Self-administered rating of dyspnea associated with activities of daily living (ADLs) ?6-point scale (0 = "not at all" to 5 = "maximal or unable to do because of breathlessness")  ?Scoring Scores range from 0 to 120.  Minimally important difference is 5 units ? ?CAT: ?CAT can identify the health impairment of COPD patients and is better correlated with disease progression.  ?CAT has a scoring range of zero to 40. The CAT score is classified into four groups of low (less than 10), medium (10 - 20), high (21-30) and very high (31-40) based on the impact level of disease on health status. A CAT score over 10 suggests significant symptoms.  A worsening CAT score could be explained by an exacerbation, poor medication adherence, poor inhaler technique, or progression of COPD or comorbid conditions.  ?CAT MCID is 2 points ? ?mMRC: ?mMRC (Modified Medical Research Council) Dyspnea Scale is used to assess the degree of baseline functional disability in patients of respiratory disease due to dyspnea. ?No minimal important difference is established. A decrease in score of 1 point or greater is considered a positive change.  ? ?Pulmonary Function Assessment: ? ? ?Exercise Target Goals: ?Exercise Program Goal: ?Individual exercise prescription set using results from initial 6 min walk test and THRR while considering  patient?s activity barriers and safety.  ? ?Exercise Prescription Goal: ?Initial exercise prescription builds to 30-45 minutes a day of aerobic activity, 2-3 days per week.  Home exercise guidelines will be given to patient during program as part of exercise prescription that the participant will acknowledge. ? ?Education: Aerobic Exercise: ?- Group verbal and visual presentation on the  components of exercise prescription. Introduces F.I.T.T principle from ACSM for exercise prescriptions.  Reviews F.I.T.T. principles of aerobic exercise including progression. Written material  given at graduation. ? ? ?Education: Resistance Exercise: ?- Group verbal and visual presentation on the components of exercise prescription. Introduces F.I.T.T principle from ACSM for exercise prescriptions  Reviews F.I.T.T. principles of resistance exercise including progression. Written material given at graduation. ? ?  ?Education: Exercise & Equipment Safety: ?- Individual verbal instruction and demonstration of equipment use and safety with use of the equipment. ?Flowsheet Row Pulmonary Rehab from 06/06/2021 in [facility] Cardiac and Pulmonary Rehab  ?Date 05/16/21  ?Educator AS  ?Instruction Review Code 1- Verbalizes Understanding  ? ?  ? ? ?Education: Exercise Physiology & General Exercise Guidelines: ?- Group verbal and written instruction with models to review the exercise physiology of the cardiovascular system and associated critical values. Provides general exercise guidelines with specific guidelines to those with heart or lung disease.  ? ? ?Education: Flexibility, Balance, Mind/Body Relaxation: ?- Group verbal and visual presentation with interactive activity on the components of exercise prescription. Introduces F.I.T.T principle from ACSM for exercise prescriptions. Reviews F.I.T.T. principles of flexibility and balance exercise training including progression. Also discusses the mind body connection.  Reviews various relaxation techniques to help reduce and manage stress (i.e. Deep breathing, progressive muscle relaxation, and visualization). Balance handout provided to take home. Written material given at graduation. ? ? ?Activity Barriers & Risk Stratification: ? Activity Barriers & Cardiac Risk Stratification - 05/08/21 1409   ? ?  ? Activity Barriers & Cardiac Risk Stratification  ? Activity Barriers Joint  Problems;Shortness of Breath;Deconditioning;Muscular Weakness;Back Problems   ? ?  ?  ? ?  ? ? ?6 Minute Walk: ? 6 Minute Walk   ? ? [place] Name 05/16/21 1638  ?  ?  ?  ? 6 Minute Walk  ? Phase Initial    ? Distance 1050 f

## 2021-06-13 DIAGNOSIS — R0602 Shortness of breath: Secondary | ICD-10-CM | POA: Diagnosis not present

## 2021-06-13 NOTE — Progress Notes (Signed)
Daily Session Note ? ?Patient Details  ?Name: Diana Cook ?MRN: [id number] ?Date of Birth: January 26, 1951 ?Referring Provider:   ?Flowsheet Row Pulmonary Rehab from 05/16/2021 in [facility] Cardiac and Pulmonary Rehab  ?Referring Provider Patsey Berthold  ? ?  ? ? ?Encounter Date: 06/13/2021 ? ?Check In: ? Session Check In - 06/13/21 0913   ? ?  ? Check-In  ? Supervising physician immediately available to respond to emergencies See telemetry face sheet for immediately available ER MD   ? Location [facility]-Cardiac & Pulmonary Rehab   ? Staff Present Birdie Sons, MPA, RN;Amanda Sommer, BA, ACSM CEP, Exercise Physiologist;Livana Yerian Amedeo Plenty, BS, ACSM CEP, Exercise Physiologist   ? Virtual Visit No   ? Medication changes reported     Yes   ? Comments 5 days of amoxicillan; taking eliquis again   ? Fall or balance concerns reported    No   ? Tobacco Cessation No Change   ? Warm-up and Cool-down Performed on first and last piece of equipment   ? Resistance Training Performed Yes   ? VAD Patient? No   ? PAD/SET Patient? No   ?  ? Pain Assessment  ? Currently in Pain? No/denies   ? ?  ?  ? ?  ? ? ? ? ? ?Social History  ? ?Tobacco Use  ?Smoking Status Former  ? Packs/day: 1.50  ? Years: 42.00  ? Pack years: 63.00  ? Types: Cigarettes  ? Quit date: 11/11/2010  ? Years since quitting: 10.5  ?Smokeless Tobacco Never  ? ? ?Goals Met:  ?Independence with exercise equipment ?Exercise tolerated well ?No report of concerns or symptoms today ?Strength training completed today ? ?Goals Unmet:  ?Not Applicable ? ?Comments: Pt able to follow exercise prescription today without complaint.  Will continue to monitor for progression. ? ? ? ?Dr. Emily Filbert is Medical Director for [place].  ?Dr. Ottie Glazier is Medical Director for [facility] Pulmonary Rehabilitation. ?

## 2021-06-14 ENCOUNTER — Encounter: Payer: Medicare Other | Admitting: *Deleted

## 2021-06-14 DIAGNOSIS — R0602 Shortness of breath: Secondary | ICD-10-CM | POA: Diagnosis not present

## 2021-06-14 NOTE — Progress Notes (Signed)
Daily Session Note ? ?Patient Details  ?Name: [name] ?MRN: [id number] ?Date of Birth: 1950/05/07 ?Referring Provider:   ?Flowsheet Row Pulmonary Rehab from 05/16/2021 in [facility] Cardiac and Pulmonary Rehab  ?Referring Provider Patsey Berthold  ? ?  ? ? ?Encounter Date: 06/14/2021 ? ?Check In: ? Session Check In - 06/14/21 0925   ? ?  ? Check-In  ? Supervising physician immediately available to respond to emergencies See telemetry face sheet for immediately available ER MD   ? Location [facility]-Cardiac & Pulmonary Rehab   ? Staff Present Renita Papa, RN BSN;Joseph [place], RCP,RRT,BSRT;Jessica [place], [place], [place], [place], CCET   ? Virtual Visit No   ? Medication changes reported     No   ? Fall or balance concerns reported    No   ? Warm-up and Cool-down Performed on first and last piece of equipment   ? Resistance Training Performed Yes   ? VAD Patient? No   ? PAD/SET Patient? No   ?  ? Pain Assessment  ? Currently in Pain? No/denies   ? ?  ?  ? ?  ? ? ? ? ? ?Social History  ? ?Tobacco Use  ?Smoking Status Former  ? Packs/day: 1.50  ? Years: 42.00  ? Pack years: 63.00  ? Types: Cigarettes  ? Quit date: 11/11/2010  ? Years since quitting: 10.5  ?Smokeless Tobacco Never  ? ? ?Goals Met:  ?Independence with exercise equipment ?Exercise tolerated well ?No report of concerns or symptoms today ?Strength training completed today ? ?Goals Unmet:  ?Not Applicable ? ?Comments: Pt able to follow exercise prescription today without complaint.  Will continue to monitor for progression. ? ? ? ?Dr. Emily Filbert is Medical Director for [place].  ?Dr. Ottie Glazier is Medical Director for [facility] Pulmonary Rehabilitation. ?

## 2021-06-17 ENCOUNTER — Other Ambulatory Visit: Payer: Self-pay | Admitting: Family Medicine

## 2021-06-17 NOTE — Telephone Encounter (Signed)
Name of Medication: Lorrin Mais ?Name of Pharmacy: [address] ?Last Fill or Written Date and Quantity: 02/19/21 #30 tab/ 3 refills ?Last Office Visit and Type: Dr. Lorelei Pont for knee pain on 04/17/21 ?Next Office Visit and Type: none scheduled  ?  ?

## 2021-06-18 DIAGNOSIS — Z20822 Contact with and (suspected) exposure to covid-19: Secondary | ICD-10-CM | POA: Diagnosis not present

## 2021-06-18 DIAGNOSIS — R0602 Shortness of breath: Secondary | ICD-10-CM

## 2021-06-18 NOTE — Progress Notes (Signed)
Daily Session Note ? ?Patient Details  ?Name: [name] ?MRN: [id number] ?Date of Birth: 02/28/1950 ?Referring Provider:   ?Flowsheet Row Pulmonary Rehab from 05/16/2021 in [facility] Cardiac and Pulmonary Rehab  ?Referring Provider Patsey Berthold  ? ?  ? ? ?Encounter Date: 06/18/2021 ? ?Check In: ? Session Check In - 06/18/21 0937   ? ?  ? Check-In  ? Supervising physician immediately available to respond to emergencies See telemetry face sheet for immediately available ER MD   ? Location [facility]-Cardiac & Pulmonary Rehab   ? Staff Present Birdie Sons, MPA, RN;Joseph [place], RCP,RRT,BSRT;Jessica [place], MA, RCEP, CCRP, CCET   ? Virtual Visit No   ? Medication changes reported     No   ? Fall or balance concerns reported    No   ? Tobacco Cessation No Change   ? Warm-up and Cool-down Performed on first and last piece of equipment   ? Resistance Training Performed Yes   ? VAD Patient? No   ? PAD/SET Patient? No   ?  ? Pain Assessment  ? Currently in Pain? No/denies   ? ?  ?  ? ?  ? ? ? ? ? ?Social History  ? ?Tobacco Use  ?Smoking Status Former  ? Packs/day: 1.50  ? Years: 42.00  ? Pack years: 63.00  ? Types: Cigarettes  ? Quit date: 11/11/2010  ? Years since quitting: 10.6  ?Smokeless Tobacco Never  ? ? ?Goals Met:  ?Independence with exercise equipment ?Exercise tolerated well ?No report of concerns or symptoms today ?Strength training completed today ? ?Goals Unmet:  ?Not Applicable ? ?Comments: Pt able to follow exercise prescription today without complaint.  Will continue to monitor for progression. ? ? ? ?Dr. Emily Filbert is Medical Director for [place].  ?Dr. Ottie Glazier is Medical Director for [facility] Pulmonary Rehabilitation. ?

## 2021-06-20 DIAGNOSIS — R0602 Shortness of breath: Secondary | ICD-10-CM | POA: Diagnosis not present

## 2021-06-20 NOTE — Progress Notes (Signed)
Daily Session Note ? ?Patient Details  ?Name: Diana Cook ?MRN: [id number] ?Date of Birth: 1950/12/16 ?Referring Provider:   ?Flowsheet Row Pulmonary Rehab from 05/16/2021 in [facility] Cardiac and Pulmonary Rehab  ?Referring Provider Patsey Berthold  ? ?  ? ? ?Encounter Date: 06/20/2021 ? ?Check In: ? Session Check In - 06/20/21 0927   ? ?  ? Check-In  ? Supervising physician immediately available to respond to emergencies See telemetry face sheet for immediately available ER MD   ? Location [facility]-Cardiac & Pulmonary Rehab   ? Staff Present Birdie Sons, MPA, RN;Joseph [place], RCP,RRT,BSRT;Melissa [place], RDN, LDN   ? Virtual Visit No   ? Medication changes reported     No   ? Fall or balance concerns reported    No   ? Tobacco Cessation No Change   ? Warm-up and Cool-down Performed on first and last piece of equipment   ? Resistance Training Performed Yes   ? VAD Patient? No   ? PAD/SET Patient? No   ?  ? Pain Assessment  ? Currently in Pain? No/denies   ? ?  ?  ? ?  ? ? ? ? ? ?Social History  ? ?Tobacco Use  ?Smoking Status Former  ? Packs/day: 1.50  ? Years: 42.00  ? Pack years: 63.00  ? Types: Cigarettes  ? Quit date: 11/11/2010  ? Years since quitting: 10.6  ?Smokeless Tobacco Never  ? ? ?Goals Met:  ?Independence with exercise equipment ?Exercise tolerated well ?No report of concerns or symptoms today ?Strength training completed today ? ?Goals Unmet:  ?Not Applicable ? ?Comments: Pt able to follow exercise prescription today without complaint.  Will continue to monitor for progression. ? ? ? ?Dr. Emily Filbert is Medical Director for [place].  ?Dr. Ottie Glazier is Medical Director for [facility] Pulmonary Rehabilitation. ?

## 2021-06-21 ENCOUNTER — Encounter: Payer: Medicare Other | Admitting: *Deleted

## 2021-06-21 DIAGNOSIS — R0602 Shortness of breath: Secondary | ICD-10-CM | POA: Diagnosis not present

## 2021-06-21 DIAGNOSIS — Z20822 Contact with and (suspected) exposure to covid-19: Secondary | ICD-10-CM | POA: Diagnosis not present

## 2021-06-21 NOTE — Progress Notes (Signed)
Daily Session Note ? ?Patient Details  ?Name: Diana Cook ?MRN: [id number] ?Date of Birth: [birth year] ?Referring Provider:   ?Flowsheet Row Pulmonary Rehab from 05/16/2021 in [facility] Cardiac and Pulmonary Rehab  ?Referring Provider Patsey Berthold  ? ?  ? ? ?Encounter Date: 06/21/2021 ? ?Check In: ? Session Check In - 06/21/21 1000   ? ?  ? Check-In  ? Supervising physician immediately available to respond to emergencies See telemetry face sheet for immediately available ER MD   ? Location [facility]Cardiac & Pulmonary Rehab   ? Staff Present Heath Lark, RN, BSN, CCRP;Jessica [place], MA, RCEP, CCRP, CCET;Joseph [place], [place]   ? Virtual Visit No   ? Medication changes reported     No   ? Fall or balance concerns reported    No   ? Warm-up and Cool-down Performed on first and last piece of equipment   ? Resistance Training Performed Yes   ? VAD Patient? No   ? PAD/SET Patient? No   ?  ? Pain Assessment  ? Currently in Pain? No/denies   ? ?  ?  ? ?  ? ? ? ? ? ?Social History  ? ?Tobacco Use  ?Smoking Status Former  ? Packs/day: 1.50  ? Years: 42.00  ? Pack years: 63.00  ? Types: Cigarettes  ? Quit date: 11/11/2010  ? Years since quitting: 10.6  ?Smokeless Tobacco Never  ? ? ?Goals Met:  ?Proper associated with RPD/PD & O2 Sat ?Independence with exercise equipment ?Exercise tolerated well ?No report of concerns or symptoms today ? ?Goals Unmet:  ?Not Applicable ? ?Comments: Pt able to follow exercise prescription today without complaint.  Will continue to monitor for progression. ? ? ? ?Dr. Emily Filbert is Medical Director for [place].  ?Dr. Ottie Glazier is Medical Director for [facility] Pulmonary Rehabilitation. ?

## 2021-06-24 NOTE — Progress Notes (Signed)
Remote pacemaker transmission.   

## 2021-06-25 ENCOUNTER — Encounter: Payer: Medicare Other | Attending: Pulmonary Disease

## 2021-06-25 DIAGNOSIS — R0602 Shortness of breath: Secondary | ICD-10-CM | POA: Insufficient documentation

## 2021-06-25 DIAGNOSIS — Z853 Personal history of malignant neoplasm of breast: Secondary | ICD-10-CM | POA: Diagnosis not present

## 2021-06-25 NOTE — Progress Notes (Signed)
Daily Session Note ? ?Patient Details  ?Name: [name] ?MRN: [id number] ?Date of Birth: 1950/04/11 ?Referring Provider:   ?Flowsheet Row Pulmonary Rehab from 05/16/2021 in [facility] Cardiac and Pulmonary Rehab  ?Referring Provider Patsey Berthold  ? ?  ? ? ?Encounter Date: 06/25/2021 ? ?Check In: ? Session Check In - 06/25/21 0919   ? ?  ? Check-In  ? Supervising physician immediately available to respond to emergencies See telemetry face sheet for immediately available ER MD   ? Location [facility]Cardiac & Pulmonary Rehab   ? Staff Present Birdie Sons, MPA, RN;Jessica [place], MA, RCEP, CCRP, [place], BS, ACSM CEP, Exercise Physiologist   ? Virtual Visit No   ? Medication changes reported     No   ? Fall or balance concerns reported    No   ? Tobacco Cessation No Change   ? Warm-up and Cool-down Performed on first and last piece of equipment   ? Resistance Training Performed Yes   ? VAD Patient? No   ? PAD/SET Patient? No   ?  ? Pain Assessment  ? Currently in Pain? No/denies   ? ?  ?  ? ?  ? ? ? ? ? ?Social History  ? ?Tobacco Use  ?Smoking Status Former  ? Packs/day: 1.50  ? Years: 42.00  ? Pack years: 63.00  ? Types: Cigarettes  ? Quit date: 11/11/2010  ? Years since quitting: 10.6  ?Smokeless Tobacco Never  ? ? ?Goals Met:  ?Independence with exercise equipment ?Exercise tolerated well ?No report of concerns or symptoms today ?Strength training completed today ? ?Goals Unmet:  ?Not Applicable ? ?Comments: Pt able to follow exercise prescription today without complaint.  Will continue to monitor for progression. ? ? ? ?Dr. Emily Filbert is Medical Director for [place].  ?Dr. Ottie Glazier is Medical Director for [facility] Pulmonary Rehabilitation. ?

## 2021-06-26 DIAGNOSIS — Z20822 Contact with and (suspected) exposure to covid-19: Secondary | ICD-10-CM | POA: Diagnosis not present

## 2021-06-27 DIAGNOSIS — R0602 Shortness of breath: Secondary | ICD-10-CM

## 2021-06-27 NOTE — Progress Notes (Signed)
Daily Session Note ? ?Patient Details  ?Name: Diana Cook ?MRN: [id number] ?Date of Birth: 07-Feb-1951 ?Referring Provider:   ?Flowsheet Row Pulmonary Rehab from 05/16/2021 in [facility] Cardiac and Pulmonary Rehab  ?Referring Provider Patsey Berthold  ? ?  ? ? ?Encounter Date: 06/27/2021 ? ?Check In: ? Session Check In - 06/27/21 0923   ? ?  ? Check-In  ? Supervising physician immediately available to respond to emergencies See telemetry face sheet for immediately available ER MD   ? Location [facility]-Cardiac & Pulmonary Rehab   ? Staff Present Birdie Sons, MPA, RN;Laureen Owens [place], BS, RRT, CPFT;Melissa [place], RDN, LDN   ? Virtual Visit No   ? Medication changes reported     No   ? Fall or balance concerns reported    No   ? Tobacco Cessation No Change   ? Warm-up and Cool-down Performed on first and last piece of equipment   ? Resistance Training Performed Yes   ? VAD Patient? No   ? PAD/SET Patient? No   ?  ? Pain Assessment  ? Currently in Pain? No/denies   ? ?  ?  ? ?  ? ? ? ? ? ?Social History  ? ?Tobacco Use  ?Smoking Status Former  ? Packs/day: 1.50  ? Years: 42.00  ? Pack years: 63.00  ? Types: Cigarettes  ? Quit date: 11/11/2010  ? Years since quitting: 10.6  ?Smokeless Tobacco Never  ? ? ?Goals Met:  ?Independence with exercise equipment ?Exercise tolerated well ?No report of concerns or symptoms today ?Strength training completed today ? ?Goals Unmet:  ?Not Applicable ? ?Comments: Pt able to follow exercise prescription today without complaint.  Will continue to monitor for progression. ? ? ? ?Dr. Emily Filbert is Medical Director for [place].  ?Dr. Ottie Glazier is Medical Director for [facility] Pulmonary Rehabilitation. ?

## 2021-06-28 ENCOUNTER — Encounter: Payer: Medicare Other | Admitting: *Deleted

## 2021-06-28 DIAGNOSIS — R0602 Shortness of breath: Secondary | ICD-10-CM

## 2021-06-28 NOTE — Progress Notes (Signed)
Daily Session Note ? ?Patient Details  ?Name: Diana Cook ?MRN: [id number] ?Date of Birth: 04/21/1950 ?Referring Provider:   ?Flowsheet Row Pulmonary Rehab from 05/16/2021 in [facility] Cardiac and Pulmonary Rehab  ?Referring Provider Patsey Berthold  ? ?  ? ? ?Encounter Date: 06/28/2021 ? ?Check In: ? Session Check In - 06/28/21 1005   ? ?  ? Check-In  ? Supervising physician immediately available to respond to emergencies See telemetry face sheet for immediately available ER MD   ? Location [facility]Cardiac & Pulmonary Rehab   ? Staff Present Heath Lark, RN, BSN, CCRP;Jessica [place], MA, RCEP, CCRP, CCET;Joseph [place], [place]   ? Virtual Visit No   ? Medication changes reported     No   ? Fall or balance concerns reported    No   ? Warm-up and Cool-down Performed on first and last piece of equipment   ? Resistance Training Performed Yes   ? VAD Patient? No   ? PAD/SET Patient? No   ?  ? Pain Assessment  ? Currently in Pain? No/denies   ? ?  ?  ? ?  ? ? ? ? ? ?Social History  ? ?Tobacco Use  ?Smoking Status Former  ? Packs/day: 1.50  ? Years: 42.00  ? Pack years: 63.00  ? Types: Cigarettes  ? Quit date: 11/11/2010  ? Years since quitting: 10.6  ?Smokeless Tobacco Never  ? ? ?Goals Met:  ?Proper associated with RPD/PD & O2 Sat ?Independence with exercise equipment ?Exercise tolerated well ?No report of concerns or symptoms today ? ?Goals Unmet:  ?Not Applicable ? ?Comments: Pt able to follow exercise prescription today without complaint.  Will continue to monitor for progression. ? ? ? ?Dr. Emily Filbert is Medical Director for [place].  ?Dr. Ottie Glazier is Medical Director for [facility] Pulmonary Rehabilitation. ?

## 2021-06-29 DIAGNOSIS — Z23 Encounter for immunization: Secondary | ICD-10-CM | POA: Diagnosis not present

## 2021-06-29 DIAGNOSIS — Z20822 Contact with and (suspected) exposure to covid-19: Secondary | ICD-10-CM | POA: Diagnosis not present

## 2021-07-02 DIAGNOSIS — R0602 Shortness of breath: Secondary | ICD-10-CM

## 2021-07-02 NOTE — Progress Notes (Signed)
Daily Session Note ? ?Patient Details  ?Name: Diana Cook ?MRN: [id number] ?Date of Birth: [birth year] ?Referring Provider:   ?Flowsheet Row Pulmonary Rehab from 05/16/2021 in [facility] Cardiac and Pulmonary Rehab  ?Referring Provider Patsey Berthold  ? ?  ? ? ?Encounter Date: 07/02/2021 ? ?Check In: ? Session Check In - 07/02/21 1443   ? ?  ? Check-In  ? Supervising physician immediately available to respond to emergencies See telemetry face sheet for immediately available ER MD   ? Location [facility]-Cardiac & Pulmonary Rehab   ? Staff Present Birdie Sons, MPA, RN;Jessica [place], MA, RCEP, CCRP, [place], BS, ACSM CEP, Exercise Physiologist;Kara Eliezer [place], MS, ASCM CEP, Exercise Physiologist   ? Virtual Visit No   ? Medication changes reported     No   ? Fall or balance concerns reported    No   ? Tobacco Cessation No Change   ? Warm-up and Cool-down Performed on first and last piece of equipment   ? Resistance Training Performed Yes   ? VAD Patient? No   ? PAD/SET Patient? No   ?  ? Pain Assessment  ? Currently in Pain? No/denies   ? ?  ?  ? ?  ? ? ? ? ? ?Social History  ? ?Tobacco Use  ?Smoking Status Former  ? Packs/day: 1.50  ? Years: 42.00  ? Pack years: 63.00  ? Types: Cigarettes  ? Quit date: 11/11/2010  ? Years since quitting: 10.6  ?Smokeless Tobacco Never  ? ? ?Goals Met:  ?Independence with exercise equipment ?Exercise tolerated well ?No report of concerns or symptoms today ?Strength training completed today ? ?Goals Unmet:  ?Not Applicable ? ?Comments: Pt able to follow exercise prescription today without complaint.  Will continue to monitor for progression. ? ? ? ?Dr. Emily Filbert is Medical Director for [place].  ?Dr. Ottie Glazier is Medical Director for [facility] Pulmonary Rehabilitation. ?

## 2021-07-03 DIAGNOSIS — Z20822 Contact with and (suspected) exposure to covid-19: Secondary | ICD-10-CM | POA: Diagnosis not present

## 2021-07-03 DIAGNOSIS — R0602 Shortness of breath: Secondary | ICD-10-CM

## 2021-07-03 NOTE — Progress Notes (Signed)
Daily Session Note ? ?Patient Details  ?Name: Diana Cook ?MRN: [id number] ?Date of Birth: [birth year] ?Referring Provider:   ?Flowsheet Row Pulmonary Rehab from 05/16/2021 in [facility] Cardiac and Pulmonary Rehab  ?Referring Provider Patsey Berthold  ? ?  ? ? ?Encounter Date: 07/03/2021 ? ?Check In: ? Session Check In - 07/03/21 0929   ? ?  ? Check-In  ? Supervising physician immediately available to respond to emergencies See telemetry face sheet for immediately available ER MD   ? Location [facility]-Cardiac & Pulmonary Rehab   ? Staff Present Birdie Sons, MPA, RN;Melissa [place], RDN, LDN;Joseph [place], RCP,RRT,BSRT   ? Virtual Visit No   ? Medication changes reported     No   ? Fall or balance concerns reported    No   ? Tobacco Cessation No Change   ? Warm-up and Cool-down Performed on first and last piece of equipment   ? Resistance Training Performed Yes   ? VAD Patient? No   ? PAD/SET Patient? No   ?  ? Pain Assessment  ? Currently in Pain? No/denies   ? ?  ?  ? ?  ? ? ? ? ? ?Social History  ? ?Tobacco Use  ?Smoking Status Former  ? Packs/day: 1.50  ? Years: 42.00  ? Pack years: 63.00  ? Types: Cigarettes  ? Quit date: 11/11/2010  ? Years since quitting: 10.6  ?Smokeless Tobacco Never  ? ? ?Goals Met:  ?Independence with exercise equipment ?Exercise tolerated well ?No report of concerns or symptoms today ?Strength training completed today ? ?Goals Unmet:  ?Not Applicable ? ?Comments: Pt able to follow exercise prescription today without complaint.  Will continue to monitor for progression. ? ? ? ?Dr. Emily Filbert is Medical Director for [place].  ?Dr. Ottie Glazier is Medical Director for [facility] Pulmonary Rehabilitation. ?

## 2021-07-04 ENCOUNTER — Encounter: Payer: Medicare Other | Admitting: *Deleted

## 2021-07-04 DIAGNOSIS — R0602 Shortness of breath: Secondary | ICD-10-CM

## 2021-07-04 NOTE — Progress Notes (Signed)
Daily Session Note ? ?Patient Details  ?Name: [name] ?MRN: [id number] ?Date of Birth: 1950/09/16 ?Referring Provider:   ?Flowsheet Row Pulmonary Rehab from 05/16/2021 in [facility] Cardiac and Pulmonary Rehab  ?Referring Provider Patsey Berthold  ? ?  ? ? ?Encounter Date: 07/04/2021 ? ?Check In: ? Session Check In - 07/04/21 0941   ? ?  ? Check-In  ? Supervising physician immediately available to respond to emergencies See telemetry face sheet for immediately available ER MD   ? Location [facility]-Cardiac & Pulmonary Rehab   ? Staff Present Heath Lark, RN, BSN, CCRP;Melissa [place], RDN, LDN;Joseph [place], [place]   ? Virtual Visit No   ? Medication changes reported     No   ? Fall or balance concerns reported    No   ? Warm-up and Cool-down Performed on first and last piece of equipment   ? Resistance Training Performed Yes   ? VAD Patient? No   ? PAD/SET Patient? No   ?  ? Pain Assessment  ? Currently in Pain? No/denies   ? ?  ?  ? ?  ? ? ? ? ? ?Social History  ? ?Tobacco Use  ?Smoking Status Former  ? Packs/day: 1.50  ? Years: 42.00  ? Pack years: 63.00  ? Types: Cigarettes  ? Quit date: 11/11/2010  ? Years since quitting: 10.6  ?Smokeless Tobacco Never  ? ? ?Goals Met:  ?Independence with exercise equipment ?Exercise tolerated well ?No report of concerns or symptoms today ? ?Goals Unmet:  ?Not Applicable ? ?Comments: Pt able to follow exercise prescription today without complaint.  Will continue to monitor for progression. ? ? ? ?Dr. Emily Filbert is Medical Director for [place].  ?Dr. Ottie Glazier is Medical Director for [facility] Pulmonary Rehabilitation. ?

## 2021-07-08 ENCOUNTER — Ambulatory Visit (INDEPENDENT_AMBULATORY_CARE_PROVIDER_SITE_OTHER): Payer: Medicare Other | Admitting: Family

## 2021-07-08 ENCOUNTER — Encounter: Payer: Self-pay | Admitting: Family

## 2021-07-08 ENCOUNTER — Emergency Department: Payer: Medicare Other

## 2021-07-08 ENCOUNTER — Encounter: Payer: Self-pay | Admitting: *Deleted

## 2021-07-08 ENCOUNTER — Emergency Department
Admission: EM | Admit: 2021-07-08 | Discharge: 2021-07-08 | Disposition: A | Discharge status: Home / Self Care | Payer: Medicare Other | Attending: Emergency Medicine | Admitting: Emergency Medicine

## 2021-07-08 ENCOUNTER — Other Ambulatory Visit: Payer: Self-pay

## 2021-07-08 VITALS — BP 78/65 | HR 78 | Temp 97.7°F | Resp 16 | Ht 69.0 in | Wt 191.4 lb

## 2021-07-08 DIAGNOSIS — Z950 Presence of cardiac pacemaker: Secondary | ICD-10-CM | POA: Insufficient documentation

## 2021-07-08 DIAGNOSIS — I2510 Atherosclerotic heart disease of native coronary artery without angina pectoris: Secondary | ICD-10-CM | POA: Diagnosis not present

## 2021-07-08 DIAGNOSIS — R300 Dysuria: Secondary | ICD-10-CM | POA: Diagnosis not present

## 2021-07-08 DIAGNOSIS — R531 Weakness: Secondary | ICD-10-CM

## 2021-07-08 DIAGNOSIS — R7989 Other specified abnormal findings of blood chemistry: Secondary | ICD-10-CM | POA: Insufficient documentation

## 2021-07-08 DIAGNOSIS — N3000 Acute cystitis without hematuria: Secondary | ICD-10-CM | POA: Diagnosis not present

## 2021-07-08 DIAGNOSIS — J9611 Chronic respiratory failure with hypoxia: Secondary | ICD-10-CM | POA: Diagnosis not present

## 2021-07-08 DIAGNOSIS — N179 Acute kidney failure, unspecified: Secondary | ICD-10-CM | POA: Diagnosis not present

## 2021-07-08 DIAGNOSIS — J449 Chronic obstructive pulmonary disease, unspecified: Secondary | ICD-10-CM | POA: Diagnosis not present

## 2021-07-08 DIAGNOSIS — R42 Dizziness and giddiness: Secondary | ICD-10-CM | POA: Diagnosis not present

## 2021-07-08 DIAGNOSIS — N3090 Cystitis, unspecified without hematuria: Secondary | ICD-10-CM | POA: Diagnosis not present

## 2021-07-08 DIAGNOSIS — I509 Heart failure, unspecified: Secondary | ICD-10-CM | POA: Insufficient documentation

## 2021-07-08 DIAGNOSIS — I959 Hypotension, unspecified: Secondary | ICD-10-CM

## 2021-07-08 LAB — URINALYSIS, ROUTINE W REFLEX MICROSCOPIC
Bilirubin Urine: NEGATIVE
Glucose, UA: NEGATIVE mg/dL
Ketones, ur: NEGATIVE mg/dL
Nitrite: POSITIVE — AB
Protein, ur: NEGATIVE mg/dL
Specific Gravity, Urine: 1.008 (ref 1.005–1.030)
WBC, UA: >50 WBC/hpf — ABNORMAL HIGH (ref 0–5)
pH: 5 (ref 5.0–8.0)

## 2021-07-08 LAB — CBC
HCT: 44.7 % (ref 36.0–46.0)
Hemoglobin: 13.9 g/dL (ref 12.0–15.0)
MCH: 29.6 pg (ref 26.0–34.0)
MCHC: 31.1 g/dL (ref 30.0–36.0)
MCV: 95.1 fL (ref 80.0–100.0)
Platelets: 326 10*3/uL (ref 150–400)
RBC: 4.7 MIL/uL (ref 3.87–5.11)
RDW: 12.4 % (ref 11.5–15.5)
WBC: 9.8 10*3/uL (ref 4.0–10.5)
nRBC: 0 % (ref 0.0–0.2)

## 2021-07-08 LAB — HEPATIC FUNCTION PANEL
ALT: 14 U/L (ref 0–44)
AST: 24 U/L (ref 15–41)
Albumin: 4.5 g/dL (ref 3.5–5.0)
Alkaline Phosphatase: 77 U/L (ref 38–126)
Bilirubin, Direct: 0.3 mg/dL — ABNORMAL HIGH (ref 0.0–0.2)
Indirect Bilirubin: 1.1 mg/dL — ABNORMAL HIGH (ref 0.3–0.9)
Total Bilirubin: 1.4 mg/dL — ABNORMAL HIGH (ref 0.3–1.2)
Total Protein: 8.1 g/dL (ref 6.5–8.1)

## 2021-07-08 LAB — BUN: BUN: 25 mg/dL — ABNORMAL HIGH (ref 8–23)

## 2021-07-08 LAB — BASIC METABOLIC PANEL
Anion gap: 15 (ref 5–15)
BUN: UNDETERMINED mg/dL (ref 8–23)
CO2: 14 mmol/L — ABNORMAL LOW (ref 22–32)
Calcium: 10 mg/dL (ref 8.9–10.3)
Chloride: 106 mmol/L (ref 98–111)
Creatinine, Ser: 1.23 mg/dL — ABNORMAL HIGH (ref 0.44–1.00)
GFR, Estimated: 47 mL/min — ABNORMAL LOW (ref >60)
Glucose, Bld: 95 mg/dL (ref 70–99)
Potassium: 5.2 mmol/L — ABNORMAL HIGH (ref 3.5–5.1)
Sodium: 135 mmol/L (ref 135–145)

## 2021-07-08 LAB — LIPASE, BLOOD: Lipase: 28 U/L (ref 11–51)

## 2021-07-08 LAB — TROPONIN I (HIGH SENSITIVITY): Troponin I (High Sensitivity): 7 ng/L (ref <18)

## 2021-07-08 MED ORDER — CEPHALEXIN 500 MG PO CAPS: 500.0000 mg | ORAL_CAPSULE | Freq: Four times a day (QID) | ORAL | 0 refills | Status: AC

## 2021-07-08 MED ORDER — LACTATED RINGERS IV BOLUS
1000.0000 mL | Freq: Once | INTRAVENOUS | Status: AC
  Administered 2021-07-08: 1000 mL via INTRAVENOUS

## 2021-07-08 MED ORDER — SODIUM CHLORIDE 0.9 % IV SOLN
1.0000 g | Freq: Once | INTRAVENOUS | Status: AC
  Administered 2021-07-08: 1 g via INTRAVENOUS
  Filled 2021-07-08: qty 10

## 2021-07-08 NOTE — Assessment & Plan Note (Addendum)
Worried about UTI/sepsis/or dehydration/ rule out possibly life threatening ddx ?Sent to Er for more stat eval/treat ?

## 2021-07-08 NOTE — Assessment & Plan Note (Signed)
Will defer as sending pt to ER.  ?

## 2021-07-08 NOTE — Assessment & Plan Note (Signed)
Suspected dehydration, very low and dizziness upon standing with weakness. ?Pt high risk pt, needs possible IVF. Sent to ER via husband who will transport. Pt stable upon leaving. ?

## 2021-07-08 NOTE — Progress Notes (Signed)
? ?Established Patient Office Visit ? ?Subjective:  ?Patient ID: Diana Cook, female    DOB: 1951/01/23  Age: 71 y.o. MRN: [id number] ? ?CC:  ?Chief Complaint  ?Patient presents with  ? Urinary Tract Infection  ?  X 5 days been taking Azo helps but has not got rid of it.  ? Dizziness  ?  X 2 days so dizzy that she could not raise her head. Pt vomit 3x Saturday.  ? ? ?HPI ?Diana Cook is here today with concerns.  ?Accompanied by husband.  ? ?Thinks she may have a UTI.  ?Started azo with some improvement. Has been dysuria as well as urinary frequency and also urgency. No new back pains.  ? ?Dizziness for the last two days and vomited on Saturday three times, but not since. Appetite has been ok. Lying down is not feeling overly dizzy however room will spin at times. The dizziness lying down has improved. Walking slightly better today but does feel dizziness with walking.  ? ?Does go to pulmonary rehab three times a week.  ? ?Centrilobular emphysema: Is on oxygen, does not wear constantly. Did read last note from pulmonologist, last note on 4/13 says to do 2 L with activity and rest and 3 1/2 L at night time. Also taking daily stiolto. Not very often needing albuterol, only last night.  ? ?Past Medical History:  ?Diagnosis Date  ? Allergic rhinitis   ? Arthritis   ? Asthma   ? as a child, mild now  ? Breast cancer (Diana [facility]) 12/2015  ? right breast cancer, lumpectomy and mammosite   ? Colon polyps   ? colonoscopy 7/08, tubular adenoma  ? Complete heart block (Diana Cook) 09/2011  ? s/p PPM implanted in [facility]  ? COPD (chronic obstructive pulmonary disease) ([place])   ? Myocardial infarction Diana Cook) 2011  ? Osteopenia 10/2015  ? Pacemaker   ? 2011  ? Paroxysmal atrial fibrillation (Diana Cook) 03/2021  ? Incidentally detected on pacemaker interrogation  ? Personal history of radiation therapy 2017  ? right breast ca, mammosite placed  ? Seizure disorder (Diana Cook)   ? Seizures ([place])   ? first one was when she was 71 years old   ? Small  bowel obstruction (Diana Cook)   ? 1988 and 2002  ? Tobacco abuse   ? ? ?Past Surgical History:  ?Procedure Laterality Date  ? ABDOMINAL HYSTERECTOMY    ? BREAST BIOPSY Right 2007  ? benign inflammatory changes, mass due to underwire bra  ? BREAST BIOPSY Left 01/02/2016  ? columnar cell changes without atypical hyperplasia.  ? BREAST BIOPSY Right 12/06/2015  ? rt breast mass 10:00, bx done at Dr. Curly [place] office, invasive ductal carcinoma  ? BREAST EXCISIONAL BIOPSY Left 01/02/2016  ? COLUMNAR CELL CHANGE AND HYPERPLASIA ASSOCIATED WITH LUMINAL AND STROMAL CALCIFICATIONS  ? BREAST LUMPECTOMY Right 01/02/2016  ? invasive mammary carcinoma, clear margins, negative LN  ? BREAST LUMPECTOMY WITH SENTINEL LYMPH NODE BIOPSY Right 01/02/2016  ? pT1c, N0; ER/ PR 100%; Her 2 neu not over expressed: BREAST LUMPECTOMY WITH SENTINEL LYMPH NODE BX;  Surgeon: [name], MD;  Location: [facility] ORS;  Service: General;  Laterality: Right;  ? CARDIAC CATHETERIZATION    ? CATARACT EXTRACTION Bilateral   ? COLONOSCOPY  10/2015  ? Dr Ardis Hughs  ? COLONOSCOPY WITH PROPOFOL N/A 08/19/2018  ? Procedure: COLONOSCOPY WITH PROPOFOL;  Surgeon: Virgel Manifold, MD;  Location: [facility] ENDOSCOPY;  Service: Endoscopy;  Laterality: N/A;  ?  ESOPHAGOGASTRODUODENOSCOPY (EGD) WITH PROPOFOL N/A 08/19/2018  ? Procedure: ESOPHAGOGASTRODUODENOSCOPY (EGD) WITH PROPOFOL;  Surgeon: Virgel Manifold, MD;  Location: [facility] ENDOSCOPY;  Service: Endoscopy;  Laterality: N/A;  ? EXPLORATORY LAPAROTOMY  01/25/2001  ? Exploratory laparotomy, lysis of adhesions, identification of internal hernia secondary to omental adhesion. Prolonged postoperative ileus.  ? gyn surgery  1993  ? hysterectomy- form endometriosis  ? LAPAROSCOPY    ? PACEMAKER INSERTION  10/24/11  ? [employer] Advantio dual chamber PPM implanted by Dr Bunnie Philips at [facility] in [place]  ? PPM GENERATOR CHANGEOUT N/A 12/05/2019  ? Procedure: PPM GENERATOR CHANGEOUT;  Surgeon: Deboraha Sprang, MD;  Location: [facility];  Service: Cardiovascular;  Laterality: N/A;  ? RIGHT/LEFT HEART CATH AND CORONARY ANGIOGRAPHY Bilateral 07/19/2019  ? Procedure: RIGHT/LEFT HEART CATH AND CORONARY ANGIOGRAPHY;  Surgeon: Nelva Bush, MD;  Location: [facility];  Service: Cardiovascular;  Laterality: Bilateral;  ? TEMPORARY PACEMAKER N/A 12/05/2019  ? Procedure: TEMPORARY PACEMAKER;  Surgeon: Deboraha Sprang, MD;  Location: [facility];  Service: Cardiovascular;  Laterality: N/A;  ? ? ?Family History  ?Problem Relation Age of Onset  ? Stroke Mother   ? Heart disease Mother 27  ?     MI and CABG  ? Dementia Mother   ? Coronary artery disease Father   ? Parkinsonism Father   ? Cancer - Cervical Daughter 53  ?     died 04-12-2022  ? Heart attack Brother 28  ? Colon cancer Neg Hx   ? Breast cancer Neg Hx   ? ? ?Social History  ? ?Socioeconomic History  ? Marital status: Married  ?  Spouse name: Not on file  ? Number of children: Not on file  ? Years of education: Not on file  ? Highest education level: Not on file  ?Occupational History  ?  Employer: RETIRED  ?Tobacco Use  ? Smoking status: Former  ?  Packs/day: 1.50  ?  Years: 42.00  ?  Pack years: 63.00  ?  Types: Cigarettes  ?  Quit date: 11/11/2010  ?  Years since quitting: 10.6  ? Smokeless tobacco: Never  ?Vaping Use  ? Vaping Use: Never used  ?Substance and Sexual Activity  ? Alcohol use: Yes  ?  Alcohol/week: 4.0 standard drinks  ?  Types: 4 Glasses of wine per week  ?  Comment: weekly  ? Drug use: No  ? Sexual activity: Never  ?Other Topics Concern  ? Not on file  ?Social History Narrative  ? Married 40 years.  ? Exercises, does walking tapes  ? ?Social Determinants of Health  ? ?Financial Resource Strain: Low Risk   ? Difficulty of Paying Living Expenses: Not hard at all  ?Food Insecurity: No Food Insecurity  ? Worried About Charity fundraiser in the Last Year: Never true  ? Ran Out of Food in the Last Year: Never true  ?Transportation  Needs: No Transportation Needs  ? Lack of Transportation (Medical): No  ? Lack of Transportation (Non-Medical): No  ?Physical Activity: Insufficiently Active  ? Days of Exercise per Week: 7 days  ? Minutes of Exercise per Session: 20 min  ?Stress: No Stress Concern Present  ? Feeling of Stress : Only a little  ?Social Connections: Socially Integrated  ? Frequency of Communication with Friends and Family: More than three times a week  ? Frequency of Social Gatherings with Friends and Family: More than three  times a week  ? Attends Religious Services: More than 4 times per year  ? Active Member of Clubs or Organizations: Yes  ? Attends Archivist Meetings: More than 4 times per year  ? Marital Status: Married  ?Intimate Partner Violence: Not At Risk  ? Fear of Current or Ex-Partner: No  ? Emotionally Abused: No  ? Physically Abused: No  ? Sexually Abused: No  ? ? ?Outpatient Medications Prior to Visit  ?Medication Sig Dispense Refill  ? acetaminophen (TYLENOL) 500 MG tablet Take 500-1,000 mg by mouth every 6 (six) hours as needed for mild pain or headache.     ? albuterol (VENTOLIN HFA) 108 (90 Base) MCG/ACT inhaler Inhale 2 puffs into the lungs every 4 (four) hours as needed for wheezing or shortness of breath. 18 g 1  ? apixaban (ELIQUIS) 5 MG TABS tablet Take 1 tablet by mouth twice daily 60 tablet 5  ? atorvastatin (LIPITOR) 40 MG tablet Take 1 tablet by mouth once daily 90 tablet 1  ? benzonatate (TESSALON) 100 MG capsule Take by mouth.    ? Calcium Carb-Cholecalciferol (CALCIUM 600 + D PO) Take 1 tablet by mouth 2 (two) times daily.    ? FLUoxetine (PROZAC) 20 MG capsule Take 1 capsule by mouth once daily 90 capsule 1  ? furosemide (LASIX) 40 MG tablet Take 1 tablet (40 mg total) by mouth daily as needed. 30 tablet 2  ? levETIRAcetam (KEPPRA) 250 MG tablet Take 250 mg by mouth 2 (two) times daily.     ? omeprazole (PRILOSEC) 40 MG capsule Take 40 mg by mouth daily.    ? OXYGEN Inhale 3 L into the lungs  at bedtime.     ? STIOLTO RESPIMAT 2.5-2.5 MCG/ACT AERS INHALE 2 PUFFS BY MOUTH ONCE DAILY 4 g 5  ? Tiotropium Bromide-Olodaterol (STIOLTO RESPIMAT) 2.5-2.5 MCG/ACT AERS Inhale 2 puffs into the lungs

## 2021-07-08 NOTE — Assessment & Plan Note (Signed)
Per last note advised pt should be on cont 2L o2 with rest/and activity as well as at night at 3.5 L ?

## 2021-07-08 NOTE — ED Triage Notes (Addendum)
Pt reports dizziness for 2 days with n/v.  Pt on 3 liters oxygen [place]. For copd.  No chest pain.  Hypotension at the office per pt.  Skin warm and dry.    Pt alert  speech clear.  Pt sent from pmd for eval of dizziness.   ?

## 2021-07-08 NOTE — ED Notes (Signed)
Pt DC to home. Iv removed, cath intact, pressure dressing applied. No bleeding noted at site. DC instructions reviewed with all questions answered. Pt verbalized understanding. Pt left dept via wheelchair with husband ?

## 2021-07-08 NOTE — ED Provider Notes (Signed)
? ?[facility] ?Provider Note ? ? ? Event Date/Time  ? First MD Initiated Contact with Patient 07/08/21 1803   ?  (approximate) ? ? ?History  ? ?Chief Complaint ?Dizziness ? ? ?HPI ? ?Diana Cook is a 71 y.o. female with past medical history of hyperlipidemia, CAD, complete heart block status post pacemaker, CHF, COPD on 3 L nasal cannula, seizures, and paroxysmal atrial fibrillation who presents to the ED complaining of dizziness.  Patient reports that she initially started having burning when she urinates 5 days ago, began taking over-the-counter AZO at that time with improvement in the burning.  Since then, she has had some discomfort in her suprapubic area and had 1 day when she felt nauseous and vomited a couple of x2 days ago.  She denies any fevers or flank pain, but does state that she has begun feeling dizzy and lightheaded.  She states that her husband has had to help her walk due to the symptoms, but she denies any focal numbness or weakness.  She was initially evaluated at the walk-in clinic, but referred to the ED for further evaluation as they were concerned she could need IV fluids.  Patient denies any cough, chest pain, or shortness of breath. ?  ? ? ?Physical Exam  ? ?Triage Vital Signs: ?ED Triage Vitals  ?Enc Vitals Group  ?   BP 07/08/21 1630 97/61  ?   Pulse Rate 07/08/21 1630 78  ?   Resp 07/08/21 1630 18  ?   Temp 07/08/21 1630 98.2 ?F (36.8 ?C)  ?   Temp Source 07/08/21 1630 Oral  ?   SpO2 07/08/21 1630 93 %  ?   Weight 07/08/21 1626 191 lb (86.6 kg)  ?   Height 07/08/21 1626 5\' 9"  (1.753 m)  ?   Head Circumference --   ?   Peak Flow --   ?   Pain Score 07/08/21 1626 2  ?   Pain Loc --   ?   Pain Edu? --   ?   Excl. in [place]? --   ? ? ?Most recent vital signs: ?Vitals:  ? 07/08/21 1915 07/08/21 2126  ?BP:  125/61  ?Pulse: 69 75  ?Resp:  18  ?Temp:  98 ?F (36.7 ?C)  ?SpO2: 94% 95%  ? ? ?Constitutional: Alert and oriented. ?Eyes: Conjunctivae are normal. ?Head:  Atraumatic. ?Nose: No congestion/rhinnorhea. ?Mouth/Throat: Mucous membranes are moist.  ?Cardiovascular: Normal rate, regular rhythm. Grossly normal heart sounds.  2+ radial pulses bilaterally. ?Respiratory: Normal respiratory effort.  No retractions. Lungs CTAB. ?Gastrointestinal: Soft and tender to palpation in the suprapubic area with no rebound or guarding.  No CVA tenderness bilaterally. No distention. ?Musculoskeletal: No lower extremity tenderness nor edema.  ?Neurologic:  Normal speech and language. No gross focal neurologic deficits are appreciated. ? ? ? ?ED Results / Procedures / Treatments  ? ?Labs ?(all labs ordered are listed, but only abnormal results are displayed) ?Labs Reviewed  ?BASIC METABOLIC PANEL - Abnormal; Notable for the following components:  ?    Result Value  ? Potassium 5.2 (*)   ? CO2 14 (*)   ? Creatinine, Ser 1.23 (*)   ? GFR, Estimated 47 (*)   ? All other components within normal limits  ?BUN - Abnormal; Notable for the following components:  ? BUN 25 (*)   ? All other components within normal limits  ?HEPATIC FUNCTION PANEL - Abnormal; Notable for the following components:  ? Total Bilirubin  1.4 (*)   ? Bilirubin, Direct 0.3 (*)   ? Indirect Bilirubin 1.1 (*)   ? All other components within normal limits  ?URINALYSIS, ROUTINE W REFLEX MICROSCOPIC - Abnormal; Notable for the following components:  ? Color, Urine AMBER (*)   ? APPearance CLOUDY (*)   ? Hgb urine dipstick SMALL (*)   ? Nitrite POSITIVE (*)   ? Leukocytes,Ua SMALL (*)   ? WBC, UA >50 (*)   ? Bacteria, UA RARE (*)   ? Non Squamous Epithelial PRESENT (*)   ? All other components within normal limits  ?URINE CULTURE  ?CBC  ?LIPASE, BLOOD  ?TROPONIN I (HIGH SENSITIVITY)  ? ? ? ?EKG ? ?ED ECG REPORT ?Tempie Hoist, the attending physician, personally viewed and interpreted this ECG. ? ? Date: 07/08/2021 ? EKG Time: 16:34 ? Rate: 81 ? Rhythm: Ventricular paced rhythm ? Axis: LAD ? Intervals:nonspecific intraventricular  conduction delay ? ST&T Change: None ? ?RADIOLOGY ?Chest x-ray reviewed and interpreted by me with no infiltrate, edema, or effusion. ? ?PROCEDURES: ? ?Critical Care performed: No ? ?Procedures ? ? ?MEDICATIONS ORDERED IN ED: ?Medications  ?lactated ringers bolus 1,000 mL (0 mLs Intravenous Stopped 07/08/21 2126)  ?cefTRIAXone (ROCEPHIN) 1 g in sodium chloride 0.9 % 100 mL IVPB (1 g Intravenous New Bag/Given 07/08/21 2126)  ? ? ? ?IMPRESSION / MDM / ASSESSMENT AND PLAN / ED COURSE  ?I reviewed the triage vital signs and the nursing notes. ?             ?               ? ?71 y.o. female with past medical history of hyperlipidemia, CAD, complete heart block status post pacemaker, CHF, COPD on 3 L nasal cannula, paroxysmal atrial fibrillation, and seizures who presents to the ED for 5 days of dysuria and suprapubic pain now associated with dizziness and lightheadedness. ? ?Differential diagnosis includes, but is not limited to, UTI, pyelonephritis, sepsis, dehydration, electrolyte abnormality, AKI, arrhythmia, pacemaker malfunction.  ? ?Patient nontoxic-appearing and in no acute distress, she apparently had low BP at walk-in clinic blood pressure seems to have normalized now at 97/61.  Remainder of vital signs are reassuring and do not appear concerning for sepsis.  She does have symptoms of UTI and UA is pending.  She admits to decreased oral intake recently with UTI symptoms, and dehydration could be contributing to her dizziness and lightheadedness.  We will hydrate with IV fluids, low suspicion for cardiac etiology given EKG showing appropriate ventricular paced rhythm.  BMP does appear consistent with dehydration given mild AKI and acidosis, potassium mildly elevated but hemolysis noted.  Troponin is within normal limits. ? ?UA is consistent with UTI, urine was sent for culture and we will give dose of IV Rocephin.  Additional labs are reassuring with LFTs and lipase unremarkable, BUN mildly elevated consistent with  patient's mild AKI.  She reports feeling much better following IV fluid bolus with dizziness now improved.  She is appropriate for discharge home with PCP follow-up for recheck of renal function, will be prescribed Keflex for UTI.  She was counseled to return to the ED for new worsening symptoms, patient agrees with plan. ? ?  ? ? ?FINAL CLINICAL IMPRESSION(S) / ED DIAGNOSES  ? ?Final diagnoses:  ?Cystitis  ?Lightheadedness  ?AKI (acute kidney injury) ([place])  ? ? ? ?Rx / DC Orders  ? ?ED Discharge Orders   ? ?      Ordered  ?  cephALEXin (KEFLEX) 500 MG capsule  4 times daily       ? 07/08/21 2153  ? ?  ?  ? ?  ? ? ? ?Note:  This document was prepared using Dragon voice recognition software and may include unintentional dictation errors. ?  ?Blake Divine, MD ?07/08/21 2200 ? ?

## 2021-07-09 ENCOUNTER — Telehealth: Payer: Self-pay

## 2021-07-09 DIAGNOSIS — R0602 Shortness of breath: Secondary | ICD-10-CM

## 2021-07-09 NOTE — Telephone Encounter (Signed)
Patient called to let us know she will not be at rehab this week as she was in the ED yesterday for a UTI and dehydration. She will call us next week to let us know how she is feeling. ?

## 2021-07-10 ENCOUNTER — Encounter: Payer: Self-pay | Admitting: *Deleted

## 2021-07-10 DIAGNOSIS — R0602 Shortness of breath: Secondary | ICD-10-CM

## 2021-07-10 NOTE — Progress Notes (Signed)
Pulmonary Individual Treatment Plan ? ?Patient Details  ?Name: Diana Cook ?MRN: [id number] ?Date of Birth: 07-24-1950 ?Referring Provider:   ?Flowsheet Row Pulmonary Rehab from 05/16/2021 in [facility] Cardiac and Pulmonary Rehab  ?Referring Provider Patsey Berthold  ? ?  ? ? ?Initial Encounter Date:  ?Flowsheet Row Pulmonary Rehab from 05/16/2021 in [facility] Cardiac and Pulmonary Rehab  ?Date 05/16/21  ? ?  ? ? ?Visit Diagnosis: Shortness of breath ? ?Patient's Home Medications on Admission: ? ?Current Outpatient Medications:  ?  acetaminophen (TYLENOL) 500 MG tablet, Take 500-1,000 mg by mouth every 6 (six) hours as needed for mild pain or headache. , Disp: , Rfl:  ?  albuterol (VENTOLIN HFA) 108 (90 Base) MCG/ACT inhaler, Inhale 2 puffs into the lungs every 4 (four) hours as needed for wheezing or shortness of breath., Disp: 18 g, Rfl: 1 ?  apixaban (ELIQUIS) 5 MG TABS tablet, Take 1 tablet by mouth twice daily, Disp: 60 tablet, Rfl: 5 ?  atorvastatin (LIPITOR) 40 MG tablet, Take 1 tablet by mouth once daily, Disp: 90 tablet, Rfl: 1 ?  benzonatate (TESSALON) 100 MG capsule, Take by mouth., Disp: , Rfl:  ?  Calcium Carb-Cholecalciferol (CALCIUM 600 + D PO), Take 1 tablet by mouth 2 (two) times daily., Disp: , Rfl:  ?  cephALEXin (KEFLEX) 500 MG capsule, Take 1 capsule (500 mg total) by mouth 4 (four) times daily for 7 days., Disp: 28 capsule, Rfl: 0 ?  FLUoxetine (PROZAC) 20 MG capsule, Take 1 capsule by mouth once daily, Disp: 90 capsule, Rfl: 1 ?  furosemide (LASIX) 40 MG tablet, Take 1 tablet (40 mg total) by mouth daily as needed., Disp: 30 tablet, Rfl: 2 ?  levETIRAcetam (KEPPRA) 250 MG tablet, Take 250 mg by mouth 2 (two) times daily. , Disp: , Rfl:  ?  omeprazole (PRILOSEC) 40 MG capsule, Take 40 mg by mouth daily., Disp: , Rfl:  ?  OXYGEN, Inhale 3 L into the lungs at bedtime. , Disp: , Rfl:  ?  STIOLTO RESPIMAT 2.5-2.5 MCG/ACT AERS, INHALE 2 PUFFS BY MOUTH ONCE DAILY, Disp: 4 g, Rfl: 5 ?  Tiotropium Bromide-Olodaterol  (STIOLTO RESPIMAT) 2.5-2.5 MCG/ACT AERS, Inhale 2 puffs into the lungs daily., Disp: 4 g, Rfl: 0 ?  tretinoin (RETIN-A) 0.1 % cream, Apply topically at bedtime., Disp: 45 g, Rfl: 1 ?  zolpidem (AMBIEN) 10 MG tablet, TAKE 1/2 TO 1 (ONE-HALF TO ONE) TABLET BY MOUTH AT BEDTIME AS NEEDED FOR SLEEP, Disp: 30 tablet, Rfl: 3 ? ?Past Medical History: ?Past Medical History:  ?Diagnosis Date  ? Allergic rhinitis   ? Arthritis   ? Asthma   ? as a child, mild now  ? Breast cancer ([place]) 12/2015  ? right breast cancer, lumpectomy and mammosite   ? Colon polyps   ? colonoscopy 7/08, tubular adenoma  ? Complete heart block ([place]) 09/2011  ? s/p PPM [facility]  ? COPD (chronic obstructive pulmonary disease) ([place])   ? Myocardial infarction [facility]) 2011  ? Osteopenia 10/2015  ? Pacemaker   ? 2011  ? Paroxysmal atrial fibrillation ([place]) 03/2021  ? Incidentally detected on pacemaker interrogation  ? Personal history of radiation therapy 2017  ? right breast ca, mammosite placed  ? Seizure disorder ([place])   ? Seizures ([place])   ? first one was when she was 71 years old   ? Small bowel obstruction ([place])   ? 1988 and 2002  ? Tobacco abuse   ? ? ?Tobacco  Use: ?Social History  ? ?Tobacco Use  ?Smoking Status Former  ? Packs/day: 1.50  ? Years: 42.00  ? Pack years: 63.00  ? Types: Cigarettes  ? Quit date: 11/11/2010  ? Years since quitting: 10.6  ?Smokeless Tobacco Never  ? ? ?Labs: ?Review Flowsheet   ? ?  ?  Latest Ref Rng & Units 12/08/2017 12/28/2018 11/16/2019 03/09/2020  ?Labs for ITP Cardiac and Pulmonary Rehab  ?Cholestrol 0 - 200 mg/dL 167   205   136   161    ?LDL (calc) 0 - 99 mg/dL 92   124   59   72    ?HDL-C >39.00 mg/dL 55.40   63.30   57   65.90    ?Trlycerides 0.0 - 149.0 mg/dL 99.0   90.0   109   120.0    ?Hemoglobin A1c 4.6 - 6.5 % 5.7   5.6    5.8    ? ?  10/24/2020  ?Labs for ITP Cardiac and Pulmonary Rehab  ?Cholestrol   ?LDL (calc)   ?HDL-C   ?Trlycerides   ?Hemoglobin A1c 6.0    ?  ?  ?  ? ? ? ?Pulmonary  Assessment Scores: ? Pulmonary Assessment Scores   ? ? [place] Name 05/16/21 1646  ?  ?  ?  ? ADL UCSD  ? SOB Score total 68    ? Rest 2    ? Walk 2    ? Stairs 4    ? Bath 2    ? Dress 1    ? Shop 4    ?  ? CAT Score  ? CAT Score 26    ?  ? mMRC Score  ? mMRC Score 1    ? ?  ?  ? ?  ?  ?UCSD: ?Self-administered rating of dyspnea associated with activities of daily living (ADLs) ?6-point scale (0 = "not at all" to 5 = "maximal or unable to do because of breathlessness")  ?Scoring Scores range from 0 to 120.  Minimally important difference is 5 units ? ?CAT: ?CAT can identify the health impairment of COPD patients and is better correlated with disease progression.  ?CAT has a scoring range of zero to 40. The CAT score is classified into four groups of low (less than 10), medium (10 - 20), high (21-30) and very high (31-40) based on the impact level of disease on health status. A CAT score over 10 suggests significant symptoms.  A worsening CAT score could be explained by an exacerbation, poor medication adherence, poor inhaler technique, or progression of COPD or comorbid conditions.  ?CAT MCID is 2 points ? ?mMRC: ?mMRC (Modified Medical Research Council) Dyspnea Scale is used to assess the degree of baseline functional disability in patients of respiratory disease due to dyspnea. ?No minimal important difference is established. A decrease in score of 1 point or greater is considered a positive change.  ? ?Pulmonary Function Assessment: ? ? ?Exercise Target Goals: ?Exercise Program Goal: ?Individual exercise prescription set using results from initial 6 min walk test and THRR while considering  patient?s activity barriers and safety.  ? ?Exercise Prescription Goal: ?Initial exercise prescription builds to 30-45 minutes a day of aerobic activity, 2-3 days per week.  Home exercise guidelines will be given to patient during program as part of exercise prescription that the participant will acknowledge. ? ?Education:  Aerobic Exercise: ?- Group verbal and visual presentation on the components of exercise prescription. Introduces F.I.T.T principle from ACSM  for exercise prescriptions.  Reviews F.I.T.T. principles of aerobic exercise including progression. Written material given at graduation. ?Flowsheet Row Pulmonary Rehab from 07/04/2021 in [facility] Cardiac and Pulmonary Rehab  ?Date 06/27/21  ?Educator [facility]  ?Instruction Review Code 1- Verbalizes Understanding  ? ?  ? ? ?Education: Resistance Exercise: ?- Group verbal and visual presentation on the components of exercise prescription. Introduces F.I.T.T principle from ACSM for exercise prescriptions  Reviews F.I.T.T. principles of resistance exercise including progression. Written material given at graduation. ?Flowsheet Row Pulmonary Rehab from 07/04/2021 in [facility] Cardiac and Pulmonary Rehab  ?Date 07/04/21  ?Educator [facility]  ?Instruction Review Code 1- Verbalizes Understanding  ? ?  ? ?  ?Education: Exercise & Equipment Safety: ?- Individual verbal instruction and demonstration of equipment use and safety with use of the equipment. ?Flowsheet Row Pulmonary Rehab from 07/04/2021 in [facility] Cardiac and Pulmonary Rehab  ?Date 05/16/21  ?Educator AS  ?Instruction Review Code 1- Verbalizes Understanding  ? ?  ? ? ?Education: Exercise Physiology & General Exercise Guidelines: ?- Group verbal and written instruction with models to review the exercise physiology of the cardiovascular system and associated critical values. Provides general exercise guidelines with specific guidelines to those with heart or lung disease.  ?Flowsheet Row Pulmonary Rehab from 07/04/2021 in [facility] Cardiac and Pulmonary Rehab  ?Date 06/20/21  ?Educator [facility]  ?Instruction Review Code 1- Verbalizes Understanding  ? ?  ? ? ?Education: Flexibility, Balance, Mind/Body Relaxation: ?- Group verbal and visual presentation with interactive activity on the components of exercise prescription. Introduces F.I.T.T principle from ACSM for  exercise prescriptions. Reviews F.I.T.T. principles of flexibility and balance exercise training including progression. Also discusses the mind body connection.  Reviews various relaxation techniques to help reduce and ma

## 2021-07-11 ENCOUNTER — Ambulatory Visit (INDEPENDENT_AMBULATORY_CARE_PROVIDER_SITE_OTHER): Payer: Medicare Other | Admitting: Internal Medicine

## 2021-07-11 ENCOUNTER — Encounter: Payer: Self-pay | Admitting: Internal Medicine

## 2021-07-11 VITALS — BP 118/72 | HR 83 | Ht 69.0 in | Wt 193.0 lb

## 2021-07-11 DIAGNOSIS — I480 Paroxysmal atrial fibrillation: Secondary | ICD-10-CM

## 2021-07-11 DIAGNOSIS — J9611 Chronic respiratory failure with hypoxia: Secondary | ICD-10-CM

## 2021-07-11 DIAGNOSIS — R42 Dizziness and giddiness: Secondary | ICD-10-CM | POA: Diagnosis not present

## 2021-07-11 DIAGNOSIS — I442 Atrioventricular block, complete: Secondary | ICD-10-CM

## 2021-07-11 DIAGNOSIS — N3090 Cystitis, unspecified without hematuria: Secondary | ICD-10-CM | POA: Diagnosis not present

## 2021-07-11 DIAGNOSIS — I2510 Atherosclerotic heart disease of native coronary artery without angina pectoris: Secondary | ICD-10-CM

## 2021-07-11 MED ORDER — FUROSEMIDE 40 MG PO TABS: 40.0000 mg | ORAL_TABLET | ORAL | Status: DC | PRN

## 2021-07-11 NOTE — Patient Instructions (Addendum)
Medication Instructions:   Your physician has recommended you make the following change in your medication:   HOLD Lasix (furosemide) and take only AS NEEDED for significant weight gain or edema/swelling  *If you need a refill on your cardiac medications before your next appointment, please call your pharmacy*   Lab Work:  None ordered  Testing/Procedures:  Your physician has requested that you have an echocardiogram in the NEXT 2-3 WEEKS (may be done same day as follow up appointment). Echocardiography is a painless test that uses sound waves to create images of your heart. It provides your doctor with information about the size and shape of your heart and how well your heart's chambers and valves are working. This procedure takes approximately one hour. There are no restrictions for this procedure.   Follow-Up: At [facility], you and your health needs are our priority.  As part of our continuing mission to provide you with exceptional heart care, we have created designated Provider Care Teams.  These Care Teams include your primary Cardiologist (physician) and Advanced Practice Providers (APPs -  Physician Assistants and Nurse Practitioners) who all work together to provide you with the care you need, when you need it.  We recommend signing up for the patient portal called "MyChart".  Sign up information is provided on this After Visit Summary.  MyChart is used to connect with patients for Virtual Visits (Telemedicine).  Patients are able to view lab/test results, encounter notes, upcoming appointments, etc.  Non-urgent messages can be sent to your provider as well.   To learn more about what you can do with MyChart, go to [email or web address].    Your next appointment:    Follow up with Dr. Caryl Comes for atrial fibrillation  3 week(s) w/ APP  The format for your next appointment:   In Person  Provider:   You will see one of the following Advanced Practice Providers on your  designated Care Team:   Murray Hodgkins, NP Christell Faith, PA-C Cadence Kathlen Mody, [place]   Important Information About Sugar

## 2021-07-11 NOTE — Progress Notes (Signed)
Follow-up Outpatient Visit Date: 07/11/2021  Primary Care Provider: Abner [place], MD [place] [address]  Chief Complaint: Follow-up coronary artery disease  HPI:  Diana Cook is a 71 y.o. female with history of moderate coronary artery disease by cardiac CTA in 11/2018 and catheterization in 06/2019, complete heart block status post permanent pacemaker followed by Dr. Caryl Comes, seizure disorder, COPD, and right breast cancer, who presents for follow-up of coronary artery disease and recently diagnosed paroxysmal atrial fibrillation.  I last saw her in February, at which time she was feeling well.  Pacemaker interrogation a week earlier had revealed paroxysmal atrial fibrillation lasting at least 6 hours, though overall burden was relatively low.  Diana Cook noted possible palpitations but was not particularly bothered by them.  Chronic exertional dyspnea was stable we agreed to initiate apixaban 5 mg twice daily.  Today, Diana Cook reports that she has not been feeling that well recently.  She initially developed lightheadedness and nausea.  She was seen in ED earlier this week and diagnosed with cystitis.  She had been experiencing some suprapubic discomfort.  Urinalysis was consistent with a UTI.  She was placed on cephalexin; culture has now grown E. coli that is sensitive to all cephalosporins.  Before the onset of the symptoms, Diana Cook reports that she had still not been feeling that well.  She reported generalized fatigue.  She denies any frank chest pain or significant shortness of breath.  She has not had any palpitations or edema.  She remains compliant with apixaban, denying bleeding.  --------------------------------------------------------------------------------------------------  Past Medical History:  Diagnosis Date   Allergic rhinitis    Arthritis    Asthma    as a child, mild now   Breast cancer ([place]) 12/2015   right breast cancer, lumpectomy and mammosite     Colon polyps    colonoscopy 7/08, tubular adenoma   Complete heart block ([place]) 09/2011   s/p PPM implanted in [place] [facility]   COPD (chronic obstructive pulmonary disease) ([place])    Myocardial infarction ([place]) 2011   Osteopenia 10/2015   Pacemaker    2011   Paroxysmal atrial fibrillation ([place]) 03/2021   Incidentally detected on pacemaker interrogation   Personal history of radiation therapy 2017   right breast ca, mammosite placed   Seizure disorder ([place])    Seizures ([place])    first one was when she was 71 years old    Small bowel obstruction ([place])    1988 and 2002   Tobacco abuse    Past Surgical History:  Procedure Laterality Date   ABDOMINAL HYSTERECTOMY     BREAST BIOPSY Right 2007   benign inflammatory changes, mass due to underwire bra   BREAST BIOPSY Left 01/02/2016   columnar cell changes without atypical hyperplasia.   BREAST BIOPSY Right 12/06/2015   rt breast mass 10:00, bx done at Dr. Curly [place] office, invasive ductal carcinoma   BREAST EXCISIONAL BIOPSY Left 01/02/2016   COLUMNAR CELL CHANGE AND HYPERPLASIA ASSOCIATED WITH LUMINAL AND STROMAL CALCIFICATIONS   BREAST LUMPECTOMY Right 01/02/2016   invasive mammary carcinoma, clear margins, negative LN   BREAST LUMPECTOMY WITH SENTINEL LYMPH NODE BIOPSY Right 01/02/2016   pT1c, N0; ER/ PR 100%; Her 2 neu not over expressed: BREAST LUMPECTOMY WITH SENTINEL LYMPH NODE BX;  Surgeon: [name], MD;  Location: [facility] ORS;  Service: General;  Laterality: Right;   CARDIAC CATHETERIZATION     CATARACT EXTRACTION Bilateral  COLONOSCOPY  10/2015   Dr Ardis Hughs   COLONOSCOPY WITH PROPOFOL N/A 08/19/2018   Procedure: COLONOSCOPY WITH PROPOFOL;  Surgeon: Virgel Manifold, MD;  Location: [facility] ENDOSCOPY;  Service: Endoscopy;  Laterality: N/A;   ESOPHAGOGASTRODUODENOSCOPY (EGD) WITH PROPOFOL N/A 08/19/2018   Procedure: ESOPHAGOGASTRODUODENOSCOPY (EGD) WITH PROPOFOL;  Surgeon: Virgel Manifold, MD;  Location: [facility]  ENDOSCOPY;  Service: Endoscopy;  Laterality: N/A;   EXPLORATORY LAPAROTOMY  01/25/2001   Exploratory laparotomy, lysis of adhesions, identification of internal hernia secondary to omental adhesion. Prolonged postoperative ileus.   gyn surgery  1993   hysterectomy- form endometriosis   LAPAROSCOPY     PACEMAKER INSERTION  10/24/11   [employer] Advantio dual chamber PPM implanted by Dr Bunnie Philips at [facility] in [place] N/A 12/05/2019   Procedure: [place];  Surgeon: Deboraha Sprang, MD;  Location: [place] CV LAB;  Service: Cardiovascular;  Laterality: N/A;   RIGHT/LEFT HEART CATH AND CORONARY ANGIOGRAPHY Bilateral 07/19/2019   Procedure: RIGHT/LEFT HEART CATH AND CORONARY ANGIOGRAPHY;  Surgeon: Nelva Bush, MD;  Location: [place] CV LAB;  Service: Cardiovascular;  Laterality: Bilateral;   TEMPORARY PACEMAKER N/A 12/05/2019   Procedure: TEMPORARY PACEMAKER;  Surgeon: Deboraha Sprang, MD;  Location: [place] CV LAB;  Service: Cardiovascular;  Laterality: N/A;    Current Meds  Medication Sig   acetaminophen (TYLENOL) 500 MG tablet Take 500-1,000 mg by mouth every 6 (six) hours as needed for mild pain or headache.    albuterol (VENTOLIN HFA) 108 (90 Base) MCG/ACT inhaler Inhale 2 puffs into the lungs every 4 (four) hours as needed for wheezing or shortness of breath.   apixaban (ELIQUIS) 5 MG TABS tablet Take 1 tablet by mouth twice daily   atorvastatin (LIPITOR) 40 MG tablet Take 1 tablet by mouth once daily   benzonatate (TESSALON) 100 MG capsule Take by mouth as needed.   Calcium Carb-Cholecalciferol (CALCIUM 600 + D PO) Take 1 tablet by mouth 2 (two) times daily.   cephALEXin (KEFLEX) 500 MG capsule Take 1 capsule (500 mg total) by mouth 4 (four) times daily for 7 days.   FLUoxetine (PROZAC) 20 MG capsule Take 1 capsule by mouth once daily   furosemide (LASIX) 40 MG tablet Take 1 tablet (40 mg total) by  mouth daily as needed.   levETIRAcetam (KEPPRA) 250 MG tablet Take 250 mg by mouth 2 (two) times daily.    omeprazole (PRILOSEC) 40 MG capsule Take 40 mg by mouth daily.   OXYGEN Inhale 3 L into the lungs at bedtime.    STIOLTO RESPIMAT 2.5-2.5 MCG/ACT AERS INHALE 2 PUFFS BY MOUTH ONCE DAILY   tretinoin (RETIN-A) 0.1 % cream Apply topically at bedtime.   zolpidem (AMBIEN) 10 MG tablet TAKE 1/2 TO 1 (ONE-HALF TO ONE) TABLET BY MOUTH AT BEDTIME AS NEEDED FOR SLEEP    Allergies: Codeine and Morphine and related  Social History   Tobacco Use   Smoking status: Former    Packs/day: 1.50    Years: 42.00    Pack years: 63.00    Types: Cigarettes    Quit date: 11/11/2010    Years since quitting: 10.6   Smokeless tobacco: Never  Vaping Use   Vaping Use: Never used  Substance Use Topics   Alcohol use: Yes    Alcohol/week: 4.0 standard drinks    Types: 4 Glasses of wine per week    Comment: weekly   Drug use: No  Family History  Problem Relation Age of Onset   Stroke Mother    Heart disease Mother 34       MI and CABG   Dementia Mother    Coronary artery disease Father    Parkinsonism Father    Cancer - Cervical Daughter 24       died March 29, 2022   Heart attack Brother 69   Colon cancer Neg Hx    Breast cancer Neg Hx     Review of Systems: A 12-system review of systems was performed and was negative except as noted in the HPI.  --------------------------------------------------------------------------------------------------  Physical Exam: BP 118/72 (BP Location: Right Arm, Patient Position: Sitting, Cuff Size: Large)   Pulse 83   Ht 5\' 9"  (1.753 m)   Wt 193 lb (87.5 kg)   SpO2 94% Comment: on 3L O2  BMI 28.50 kg/m  Repeat BP: 100/64  General:  NAD.  She is wearing supplemental oxygen via nasal cannula. Neck: No JVD or HJR. Lungs: Mildly diminished breath sounds throughout without wheezes or crackles. Heart: Regular rate and rhythm without murmurs, rubs, or  gallops. Abdomen: Soft, nontender, nondistended. Extremities: No lower extremity edema.  EKG: Normal sinus rhythm with atrially sensed and ventricularly paced rhythm.  Lab Results  Component Value Date   WBC 9.8 07/08/2021   HGB 13.9 07/08/2021   HCT 44.7 07/08/2021   MCV 95.1 07/08/2021   PLT 326 07/08/2021    Lab Results  Component Value Date   NA 135 07/08/2021   K 5.2 (H) 07/08/2021   CL 106 07/08/2021   CO2 14 (L) 07/08/2021   BUN 25 (H) 07/08/2021   CREATININE 1.23 (H) 07/08/2021   GLUCOSE 95 07/08/2021   ALT 14 07/08/2021    Lab Results  Component Value Date   CHOL 161 03/09/2020   HDL 65.90 03/09/2020   LDLCALC 72 03/09/2020   TRIG 120.0 03/09/2020   CHOLHDL 2 03/09/2020    --------------------------------------------------------------------------------------------------  ASSESSMENT AND PLAN: Lightheadedness and cystitis: I suspect cystitis has been contributing to the patient's hypotension and lightheadedness.  Blood pressure is a little better today than during recent ED visit.  I am concerned for risk of dehydration and have recommended that Diana Cook not take furosemide regularly but instead use it only for objective signs of fluid retention such as weight gain or persistent edema.  Paroxysmal atrial fibrillation: Diana Cook denies palpitations but has felt generally less energetic since our last visit when her first episode of atrial fibrillation had been detected by her pacemaker.  Most recent device interrogation mentions 28 arrhythmias, though on my personal review of the report and EGM's, the longest episode was less than 40 seconds.  Nonetheless, I think it would be worthwhile for Diana Cook to see Dr. Caryl Comes at her convenience to discuss potential rhythm control options to see if that would help her symptoms.  We will plan to continue apixaban 5 mg twice daily.  We will also obtain an echocardiogram to ensure that she has not developed new cardiomyopathy or  other structural abnormalities that could explain her recent development of PAF as well as fatigue.  Chronic respiratory failure with hypoxia: Diana Cook remains on supplemental oxygen.  Continue follow-up with pulmonary.  Nonobstructive coronary artery disease: No angina reported.  Continue atorvastatin 40 mg daily to help prevent progression of disease.  Complete heart block: EKG today shows normal sinus rhythm with atrially sensed ventricularly paced rhythm.  Continue ongoing follow-up with our device clinic.  Follow-up:  Return to clinic in 3 weeks.  Nelva Bush, MD 07/11/2021 1:37 PM

## 2021-07-12 ENCOUNTER — Encounter: Payer: Self-pay | Admitting: Internal Medicine

## 2021-07-12 ENCOUNTER — Other Ambulatory Visit: Payer: Self-pay | Admitting: Internal Medicine

## 2021-07-13 LAB — URINE CULTURE: Culture: >=100000 — AB

## 2021-07-16 ENCOUNTER — Ambulatory Visit (INDEPENDENT_AMBULATORY_CARE_PROVIDER_SITE_OTHER): Payer: Medicare Other

## 2021-07-16 DIAGNOSIS — I480 Paroxysmal atrial fibrillation: Secondary | ICD-10-CM

## 2021-07-16 LAB — ECHOCARDIOGRAM COMPLETE
AR max vel: 2.86 cm2
AV Area VTI: 2.9 cm2
AV Area mean vel: 3.02 cm2
AV Mean grad: 3 mmHg
AV Peak grad: 5.9 mmHg
Ao pk vel: 1.21 m/s
Area-P 1/2: 2.43 cm2
Calc EF: 52.5 %
S' Lateral: 2.9 cm
Single Plane A2C EF: 54.7 %
Single Plane A4C EF: 48.1 %

## 2021-07-17 ENCOUNTER — Encounter: Payer: Self-pay | Admitting: Family Medicine

## 2021-07-17 ENCOUNTER — Ambulatory Visit (INDEPENDENT_AMBULATORY_CARE_PROVIDER_SITE_OTHER): Payer: Medicare Other | Admitting: Family Medicine

## 2021-07-17 VITALS — BP 116/64 | HR 77 | Temp 97.7°F | Ht 69.0 in | Wt 198.2 lb

## 2021-07-17 DIAGNOSIS — R0602 Shortness of breath: Secondary | ICD-10-CM

## 2021-07-17 DIAGNOSIS — I480 Paroxysmal atrial fibrillation: Secondary | ICD-10-CM

## 2021-07-17 DIAGNOSIS — I5032 Chronic diastolic (congestive) heart failure: Secondary | ICD-10-CM

## 2021-07-17 DIAGNOSIS — N3000 Acute cystitis without hematuria: Secondary | ICD-10-CM

## 2021-07-17 DIAGNOSIS — H938X1 Other specified disorders of right ear: Secondary | ICD-10-CM | POA: Insufficient documentation

## 2021-07-17 DIAGNOSIS — I2510 Atherosclerotic heart disease of native coronary artery without angina pectoris: Secondary | ICD-10-CM | POA: Diagnosis not present

## 2021-07-17 DIAGNOSIS — R42 Dizziness and giddiness: Secondary | ICD-10-CM

## 2021-07-17 DIAGNOSIS — N179 Acute kidney failure, unspecified: Secondary | ICD-10-CM | POA: Diagnosis not present

## 2021-07-17 DIAGNOSIS — N390 Urinary tract infection, site not specified: Secondary | ICD-10-CM | POA: Insufficient documentation

## 2021-07-17 DIAGNOSIS — R5382 Chronic fatigue, unspecified: Secondary | ICD-10-CM | POA: Diagnosis not present

## 2021-07-17 LAB — BASIC METABOLIC PANEL
BUN: 23 mg/dL (ref 6–23)
CO2: 26 mEq/L (ref 19–32)
Calcium: 10.6 mg/dL — ABNORMAL HIGH (ref 8.4–10.5)
Chloride: 99 mEq/L (ref 96–112)
Creatinine, Ser: 1.04 mg/dL (ref 0.40–1.20)
GFR: 53.52 mL/min — ABNORMAL LOW (ref >60.00)
Glucose, Bld: 92 mg/dL (ref 70–99)
Potassium: 4.8 mEq/L (ref 3.5–5.1)
Sodium: 136 mEq/L (ref 135–145)

## 2021-07-17 NOTE — Assessment & Plan Note (Signed)
From recent uti with dehydration   Resolved uti, and much better hydrated (also cut her lasix down)   bmet today  Enc her to drink fluids and watch for s/s of dehydration

## 2021-07-17 NOTE — Progress Notes (Signed)
Subjective:    Patient ID: Diana Cook, female    DOB: October 21, 1950, 71 y.o.   MRN: [id number]  HPI Pt presents for f/u of hosp  Wt Readings from Last 3 Encounters:  07/17/21 198 lb 4 oz (89.9 kg)  07/11/21 193 lb (87.5 kg)  07/08/21 191 lb (86.6 kg)   29.28 kg/m  Has gained weight  Is a little more sob    ER 5/15 Presented with urinary symptoms and dizziness /lightheadedness Bp was low /suspect dehydration (mild AKI) Pos ua, given IV rochephin  Then px keflex   Urine cx returned with E coli resistant to amp but otherwise pan sensitive   Lab Results  Component Value Date   CREATININE 1.23 (H) 07/08/2021   BUN 25 (H) 07/08/2021   NA 135 07/08/2021   K 5.2 (H) 07/08/2021   CL 106 07/08/2021   CO2 14 (L) 07/08/2021   GFR 47   Lab Results  Component Value Date   WBC 9.8 07/08/2021   HGB 13.9 07/08/2021   HCT 44.7 07/08/2021   MCV 95.1 07/08/2021   PLT 326 07/08/2021     In light of this her cardiologist suggested using lasix prn instead of scheduled  ? If fatigue was from a fib as well  Echo was ordered  Has app Dr Caryl Comes (EP) early in June  BP Readings from Last 3 Encounters:  07/17/21 116/64  07/11/21 118/72  07/08/21 139/73   Pulse Readings from Last 3 Encounters:  07/17/21 77  07/11/21 83  07/08/21 85   Pushing fluids now -drinks a lot of water  Doing well with that   No urinary discomfort at all   On her 2nd pacemaker now   Still some fatigue  Wears 02 full time  On eliquis   Pend echo report   Needs to schedule annual exam   No fever or nausea now   Had some fluid in R ear -with last doctor visit  Gets water in it occ  No pain / has in the past   Patient Active Problem List   Diagnosis Date Noted   UTI (urinary tract infection) 07/17/2021   AKI (acute kidney injury) ([place]) 07/17/2021   Ear pressure, right 07/17/2021   Weakness 07/08/2021   Dysuria 07/08/2021   Hypotension 07/08/2021   Paroxysmal atrial fibrillation ([place])  04/11/2021   Current use of proton pump inhibitor 10/24/2020   Sleep apnea 10/24/2020   Fatigue 10/24/2020   Orthostatic lightheadedness 05/16/2020   Radicular pain in left arm 03/02/2020   Neck pain on left side 02/15/2020   Paresthesia 02/15/2020   Chronic respiratory failure with hypoxia ([place]) 11/17/2019   Chronic heart failure with preserved ejection fraction (HFpEF) ([place]) 08/11/2019   Coronary artery disease involving native coronary artery of native heart without angina pectoris 06/20/2019   Gastroesophageal reflux disease    Stomach irritation    Abnormal CT scan, esophagus    Polyp of ascending colon    Personal history of tobacco use, presenting hazards to health 01/18/2018   Grade I diastolic dysfunction 40/98/1191   Globus pharyngeus 12/21/2017   Chronic obstructive pulmonary disease ([place]) 12/15/2017   Hyperlipidemia LDL goal <70 12/07/2017   Seizures ([place]) 10/01/2016   Elevated glucose 08/31/2016   Grief reaction 04/11/2016   Malignant neoplasm of upper-outer quadrant of right breast in female, estrogen receptor positive ([place]) 12/11/2015   Osteoporosis 11/22/2015   Estrogen deficiency 09/07/2015   Bruising 09/07/2015   Wrinkles 09/07/2015  Post herpetic neuralgia 09/01/2014   Encounter for Medicare annual wellness exam 07/11/2014   Routine general medical examination at a health care facility 06/26/2013   H/O small bowel obstruction 04/15/2013   Dyspnea on exertion 02/10/2013   Complete heart block ([place]) 11/03/2011   Pacemaker-[employer] 11/03/2011   Lumbar spinal stenosis 09/02/2011   Shortness of breath 10/30/2010   POSTMENOPAUSAL STATUS 08/06/2009   HIDRADENITIS SUPPURATIVA 06/26/2008   HERNIATED [place] 12/14/2007   BACK, LOWER, PAIN 12/02/2007   HX, PERSONAL, COLONIC POLYPS 09/14/2006   Former smoker 06/10/2006   ASTHMA 06/10/2006   H/O idiopathic seizure 06/10/2006   Past Medical History:  Diagnosis Date   Allergic rhinitis    Arthritis     Asthma    as a child, mild now   Breast cancer ([place]) 12/2015   right breast cancer, lumpectomy and mammosite    Colon polyps    colonoscopy 7/08, tubular adenoma   Complete heart block ([place]) 09/2011   s/p PPM implanted in [place] [facility]   COPD (chronic obstructive pulmonary disease) ([place])    Myocardial infarction ([place]) 2011   Osteopenia 10/2015   Pacemaker    2011   Paroxysmal atrial fibrillation ([place]) 03/2021   Incidentally detected on pacemaker interrogation   Personal history of radiation therapy 2017   right breast ca, mammosite placed   Seizure disorder ([place])    Seizures ([place])    first one was when she was 71 years old    Small bowel obstruction ([place])    1988 and 2002   Tobacco abuse    Past Surgical History:  Procedure Laterality Date   ABDOMINAL HYSTERECTOMY     BREAST BIOPSY Right 2007   benign inflammatory changes, mass due to underwire bra   BREAST BIOPSY Left 01/02/2016   columnar cell changes without atypical hyperplasia.   BREAST BIOPSY Right 12/06/2015   rt breast mass 10:00, bx done at Dr. Curly [place] office, invasive ductal carcinoma   BREAST EXCISIONAL BIOPSY Left 01/02/2016   COLUMNAR CELL CHANGE AND HYPERPLASIA ASSOCIATED WITH LUMINAL AND STROMAL CALCIFICATIONS   BREAST LUMPECTOMY Right 01/02/2016   invasive mammary carcinoma, clear margins, negative LN   BREAST LUMPECTOMY WITH SENTINEL LYMPH NODE BIOPSY Right 01/02/2016   pT1c, N0; ER/ PR 100%; Her 2 neu not over expressed: BREAST LUMPECTOMY WITH SENTINEL LYMPH NODE BX;  Surgeon: [name], MD;  Location: [facility] ORS;  Service: General;  Laterality: Right;   CARDIAC CATHETERIZATION     CATARACT EXTRACTION Bilateral    COLONOSCOPY  10/2015   Dr Ardis Hughs   COLONOSCOPY WITH PROPOFOL N/A 08/19/2018   Procedure: COLONOSCOPY WITH PROPOFOL;  Surgeon: Virgel Manifold, MD;  Location: [facility] ENDOS[facility];  Service: Endoscopy;  Laterality: N/A;   ESOPHAGOGASTRODUODENOSCOPY (EGD) WITH PROPOFOL N/A 08/19/2018    Procedure: ESOPHAGOGASTRODUODENOSCOPY (EGD) WITH PROPOFOL;  Surgeon: Virgel Manifold, MD;  Location: [facility] ENDOSCOPY;  Service: Endoscopy;  Laterality: N/A;   EXPLORATORY LAPAROTOMY  01/25/2001   Exploratory laparotomy, lysis of adhesions, identification of internal hernia secondary to omental adhesion. Prolonged postoperative ileus.   gyn surgery  1993   hysterectomy- form endometriosis   LAPAROSCOPY     PACEMAKER INSERTION  10/24/11   [employer] Advantio dual chamber PPM implanted by Dr Bunnie Philips at [facility] in [place] N/A 12/05/2019   Procedure: [place];  Surgeon: Deboraha Sprang, MD;  Location: [place] CV LAB;  Service: Cardiovascular;  Laterality: N/A;  RIGHT/LEFT HEART CATH AND CORONARY ANGIOGRAPHY Bilateral 07/19/2019   Procedure: RIGHT/LEFT HEART CATH AND CORONARY ANGIOGRAPHY;  Surgeon: Nelva Bush, MD;  Location: [place] [facility];  Service: Cardiovascular;  Laterality: Bilateral;   TEMPORARY PACEMAKER N/A 12/05/2019   Procedure: TEMPORARY PACEMAKER;  Surgeon: Deboraha Sprang, MD;  Location: [place] [facility];  Service: Cardiovascular;  Laterality: N/A;   Social History   Tobacco Use   Smoking status: Former    Packs/day: 1.50    Years: 42.00    Pack years: 63.00    Types: Cigarettes    Quit date: 11/11/2010    Years since quitting: 10.6   Smokeless tobacco: Never  Vaping Use   Vaping Use: Never used  Substance Use Topics   Alcohol use: Yes    Alcohol/week: 4.0 standard drinks    Types: 4 Glasses of wine per week    Comment: weekly   Drug use: No   Family History  Problem Relation Age of Onset   Stroke Mother    Heart disease Mother 32       MI and CABG   Dementia Mother    Coronary artery disease Father    Parkinsonism Father    Cancer - Cervical Daughter 16       died April 08, 2022   Heart attack Brother 49   Colon cancer Neg Hx    Breast cancer Neg Hx    Allergies  Allergen  Reactions   Codeine Nausea And Vomiting   Morphine And Related Nausea Only   Current Outpatient Medications on File Prior to Visit  Medication Sig Dispense Refill   acetaminophen (TYLENOL) 500 MG tablet Take 500-1,000 mg by mouth every 6 (six) hours as needed for mild pain or headache.      albuterol (VENTOLIN HFA) 108 (90 Base) MCG/ACT inhaler Inhale 2 puffs into the lungs every 4 (four) hours as needed for wheezing or shortness of breath. 18 g 1   apixaban (ELIQUIS) 5 MG TABS tablet Take 1 tablet by mouth twice daily 60 tablet 5   atorvastatin (LIPITOR) 40 MG tablet Take 1 tablet by mouth once daily 90 tablet 1   benzonatate (TESSALON) 100 MG capsule Take by mouth as needed.     Calcium Carb-Cholecalciferol (CALCIUM 600 + D PO) Take 1 tablet by mouth 2 (two) times daily.     FLUoxetine (PROZAC) 20 MG capsule Take 1 capsule by mouth once daily 90 capsule 1   furosemide (LASIX) 40 MG tablet TAKE 1 TABLET BY MOUTH ONCE DAILY AS NEEDED 90 tablet 0   levETIRAcetam (KEPPRA) 250 MG tablet Take 250 mg by mouth 2 (two) times daily.      omeprazole (PRILOSEC) 40 MG capsule Take 40 mg by mouth daily.     OXYGEN Inhale 3 L into the lungs at bedtime.      STIOLTO RESPIMAT 2.5-2.5 MCG/ACT AERS INHALE 2 PUFFS BY MOUTH ONCE DAILY 4 g 5   tretinoin (RETIN-A) 0.1 % cream Apply topically at bedtime. 45 g 1   zolpidem (AMBIEN) 10 MG tablet TAKE 1/2 TO 1 (ONE-HALF TO ONE) TABLET BY MOUTH AT BEDTIME AS NEEDED FOR SLEEP 30 tablet 3   No current facility-administered medications on file prior to visit.    Review of Systems  Constitutional:  Positive for fatigue. Negative for activity change, appetite change, fever and unexpected weight change.  HENT:  Negative for congestion, ear pain, rhinorrhea, sinus pressure and sore throat.   Eyes:  Negative for pain, redness  and visual disturbance.  Respiratory:  Positive for shortness of breath. Negative for cough, chest tightness and wheezing.   Cardiovascular:   Positive for leg swelling. Negative for chest pain and palpitations.       Some ankle edema later in day Not now  Gastrointestinal:  Negative for abdominal pain, blood in stool, constipation and diarrhea.  Endocrine: Negative for polydipsia and polyuria.  Genitourinary:  Negative for dysuria, frequency and urgency.  Musculoskeletal:  Negative for arthralgias, back pain and myalgias.  Skin:  Negative for pallor and rash.  Allergic/Immunologic: Negative for environmental allergies.  Neurological:  Negative for dizziness, syncope, light-headedness and headaches.       No longer light headed  Hematological:  Negative for adenopathy. Does not bruise/bleed easily.  Psychiatric/Behavioral:  Negative for decreased concentration and dysphoric mood. The patient is not nervous/anxious.       Objective:   Physical Exam Constitutional:      General: She is not in acute distress.    Appearance: Normal appearance. She is well-developed. She is not ill-appearing or diaphoretic.     Comments: overwt  HENT:     Head: Normocephalic and atraumatic.     Mouth/Throat:     Mouth: Mucous membranes are moist.  Eyes:     General: No scleral icterus.    Conjunctiva/sclera: Conjunctivae normal.     Pupils: Pupils are equal, round, and reactive to light.  Neck:     Thyroid: No thyromegaly.     Vascular: No carotid bruit or JVD.  Cardiovascular:     Rate and Rhythm: Normal rate.     Heart sounds: Normal heart sounds. No murmur heard.   No gallop.  Pulmonary:     Effort: Pulmonary effort is normal. No respiratory distress.     Breath sounds: Normal breath sounds. No wheezing or rales.     Comments: Wearing 02 with [place] Abdominal:     General: There is no distension or abdominal bruit.     Palpations: Abdomen is soft.     Tenderness: There is no right CVA tenderness or left CVA tenderness.     Comments: No suprapubic tenderness or fullness     Musculoskeletal:     Cervical back: Normal range of motion  and neck supple.     Right lower leg: No edema.     Left lower leg: No edema.  Lymphadenopathy:     Cervical: No cervical adenopathy.  Skin:    General: Skin is warm and dry.     Coloration: Skin is not pale.     Findings: No erythema or rash.  Neurological:     Mental Status: She is alert.     Coordination: Coordination normal.     Deep Tendon Reflexes: Reflexes are normal and symmetric. Reflexes normal.  Psychiatric:        Mood and Affect: Mood normal.          Assessment & Plan:   Problem List Items Addressed This Visit       Cardiovascular and Mediastinum   Chronic heart failure with preserved ejection fraction (HFpEF) [facility])    Cardiology cut lasix down to 40 mg prn instead of daily  Wt is up 5 lb  Less dehydrated Pt notes she is a little more sob (unsure)  No crackles on exam  Will plan cardiology f/u        Orthostatic lightheadedness    Likely caused by uti and dehydration  Much improved now with resolution of  both  BP: 116/64    She has cardiology f/u planned       Paroxysmal atrial fibrillation [facility])    Reviewed last cardiology note Taking eliquis  F/u planned with Dr Caryl Comes in June           Nervous and Auditory   Ear pressure, right    Pt noted this at last visit and comes and goes  Nl exam  May have been from ETD  Given handout Recommend steroid ns like flonase prn  Watch for ear pain /dizziness and update          Genitourinary   AKI (acute kidney injury) ([place]) - Primary    From recent uti with dehydration   Resolved uti, and much better hydrated (also cut her lasix down)   bmet today  Enc her to drink fluids and watch for s/s of dehydration       Relevant Orders   Basic metabolic panel   UTI (urinary tract infection)    In ER- Reviewed hospital records, lab results and studies in detail    cx noted e coli and sens to her cephalosporin  Clinically improved  Disc s/s to watch for  Will try to stay better hydrated          Other   Fatigue

## 2021-07-17 NOTE — Assessment & Plan Note (Signed)
In ER- Reviewed hospital records, lab results and studies in detail    cx noted e coli and sens to her cephalosporin  Clinically improved  Disc s/s to watch for  Will try to stay better hydrated

## 2021-07-17 NOTE — Assessment & Plan Note (Signed)
Cardiology cut lasix down to 40 mg prn instead of daily  Wt is up 5 lb  Less dehydrated Pt notes she is a little more sob (unsure)  No crackles on exam  Will plan cardiology f/u

## 2021-07-17 NOTE — Patient Instructions (Addendum)
Keep drinking fluids   Continue cardiology follow up   Your blood pressure looks ok   If you get ear pressure -try flonase nasal spray daily (for 2 or more weeks)   Lab today for kidney function

## 2021-07-17 NOTE — Assessment & Plan Note (Signed)
Pt noted this at last visit and comes and goes  Nl exam  May have been from ETD  Given handout Recommend steroid ns like flonase prn  Watch for ear pain /dizziness and update

## 2021-07-17 NOTE — Assessment & Plan Note (Signed)
Reviewed last cardiology note Taking eliquis  F/u planned with Dr Caryl Comes in June

## 2021-07-17 NOTE — Assessment & Plan Note (Signed)
Likely caused by uti and dehydration  Much improved now with resolution of both  BP: 116/64    She has cardiology f/u planned

## 2021-07-17 NOTE — Progress Notes (Signed)
Daily Session Note  Patient Details  Name: Diana Cook MRN: [id number] Date of Birth: Sep 24, 1950 Referring Provider:   Flowsheet Row Pulmonary Rehab from 05/16/2021 in [facility] Cardiac and Pulmonary Rehab  Referring Provider Patsey Berthold       Encounter Date: 07/17/2021  Check In:  Session Check In - 07/17/21 0932       Check-In   Supervising physician immediately available to respond to emergencies See telemetry face sheet for immediately available ER MD    Location [facility]-Cardiac & Pulmonary Rehab    Staff Present Birdie Sons, MPA, RN;Jessica Olga, MA, RCEP, CCRP, CCET;Joseph Bear Dance, [place]    Virtual Visit No    Medication changes reported     No    Fall or balance concerns reported    No    Tobacco Cessation No Change    Warm-up and Cool-down Performed on first and last piece of equipment    Resistance Training Performed Yes    VAD Patient? No    PAD/SET Patient? No      Pain Assessment   Currently in Pain? No/denies                Social History   Tobacco Use  Smoking Status Former   Packs/day: 1.50   Years: 42.00   Pack years: 63.00   Types: Cigarettes   Quit date: 11/11/2010   Years since quitting: 10.6  Smokeless Tobacco Never    Goals Met:  Independence with exercise equipment Exercise tolerated well No report of concerns or symptoms today Strength training completed today  Goals Unmet:  Not Applicable  Comments: Pt able to follow exercise prescription today without complaint.  Will continue to monitor for progression.    Dr. Emily Filbert is Medical Director for [place].  Dr. Ottie Glazier is Medical Director for [facility] Pulmonary Rehabilitation.

## 2021-07-18 DIAGNOSIS — R0602 Shortness of breath: Secondary | ICD-10-CM | POA: Diagnosis not present

## 2021-07-18 NOTE — Progress Notes (Signed)
Daily Session Note  Patient Details  Name: Diana Cook MRN: [id number] Date of Birth: 1950/06/12 Referring Provider:   Flowsheet Row Pulmonary Rehab from 05/16/2021 in [facility] Cardiac and Pulmonary Rehab  Referring Provider Patsey Berthold       Encounter Date: 07/18/2021  Check In:  Session Check In - 07/18/21 0920       Check-In   Supervising physician immediately available to respond to emergencies See telemetry face sheet for immediately available ER MD    Location [facility]-Cardiac & Pulmonary Rehab    Staff Present Birdie Sons, MPA, Nino Glow, MS, ASCM CEP, Exercise Physiologist;Laureen Owens Shark, BS, RRT, CPFT    Virtual Visit No    Medication changes reported     No    Fall or balance concerns reported    No    Tobacco Cessation No Change    Warm-up and Cool-down Performed on first and last piece of equipment    Resistance Training Performed Yes    VAD Patient? No    PAD/SET Patient? No      Pain Assessment   Currently in Pain? No/denies                Social History   Tobacco Use  Smoking Status Former   Packs/day: 1.50   Years: 42.00   Pack years: 63.00   Types: Cigarettes   Quit date: 11/11/2010   Years since quitting: 10.6  Smokeless Tobacco Never    Goals Met:  Independence with exercise equipment Exercise tolerated well No report of concerns or symptoms today Strength training completed today  Goals Unmet:  Not Applicable  Comments: Pt able to follow exercise prescription today without complaint.  Will continue to monitor for progression.    Dr. Emily Filbert is Medical Director for [place].  Dr. Ottie Glazier is Medical Director for [facility] Pulmonary Rehabilitation.

## 2021-07-19 ENCOUNTER — Other Ambulatory Visit: Payer: Self-pay | Admitting: Pulmonary Disease

## 2021-07-22 ENCOUNTER — Telehealth: Payer: Self-pay | Admitting: Family Medicine

## 2021-07-22 DIAGNOSIS — Z79899 Other long term (current) drug therapy: Secondary | ICD-10-CM

## 2021-07-22 DIAGNOSIS — R7309 Other abnormal glucose: Secondary | ICD-10-CM

## 2021-07-22 DIAGNOSIS — E785 Hyperlipidemia, unspecified: Secondary | ICD-10-CM

## 2021-07-22 DIAGNOSIS — M810 Age-related osteoporosis without current pathological fracture: Secondary | ICD-10-CM

## 2021-07-22 DIAGNOSIS — R5382 Chronic fatigue, unspecified: Secondary | ICD-10-CM

## 2021-07-22 NOTE — Telephone Encounter (Signed)
-----   Message from Ellamae Sia sent at 07/17/2021 11:18 AM EDT ----- Regarding: Lab orders for Tuesday, 5.30.23 Patient is scheduled for CPX labs, please order future labs, Thanks , [name]

## 2021-07-23 ENCOUNTER — Other Ambulatory Visit (INDEPENDENT_AMBULATORY_CARE_PROVIDER_SITE_OTHER): Payer: Medicare Other

## 2021-07-23 ENCOUNTER — Other Ambulatory Visit: Payer: Medicare Other

## 2021-07-23 DIAGNOSIS — Z79899 Other long term (current) drug therapy: Secondary | ICD-10-CM | POA: Diagnosis not present

## 2021-07-23 DIAGNOSIS — E785 Hyperlipidemia, unspecified: Secondary | ICD-10-CM

## 2021-07-23 DIAGNOSIS — R5382 Chronic fatigue, unspecified: Secondary | ICD-10-CM

## 2021-07-23 DIAGNOSIS — R7309 Other abnormal glucose: Secondary | ICD-10-CM

## 2021-07-23 DIAGNOSIS — M810 Age-related osteoporosis without current pathological fracture: Secondary | ICD-10-CM | POA: Diagnosis not present

## 2021-07-23 LAB — CBC WITH DIFFERENTIAL/PLATELET
Basophils Absolute: 0.1 10*3/uL (ref 0.0–0.1)
Basophils Relative: 0.8 % (ref 0.0–3.0)
Eosinophils Absolute: 0.2 10*3/uL (ref 0.0–0.7)
Eosinophils Relative: 3.2 % (ref 0.0–5.0)
HCT: 40.6 % (ref 36.0–46.0)
Hemoglobin: 13.5 g/dL (ref 12.0–15.0)
Lymphocytes Relative: 18.9 % (ref 12.0–46.0)
Lymphs Abs: 1.2 10*3/uL (ref 0.7–4.0)
MCHC: 33.3 g/dL (ref 30.0–36.0)
MCV: 91.9 fl (ref 78.0–100.0)
Monocytes Absolute: 0.6 10*3/uL (ref 0.1–1.0)
Monocytes Relative: 9.4 % (ref 3.0–12.0)
Neutro Abs: 4.4 10*3/uL (ref 1.4–7.7)
Neutrophils Relative %: 67.7 % (ref 43.0–77.0)
Platelets: 314 10*3/uL (ref 150.0–400.0)
RBC: 4.41 Mil/uL (ref 3.87–5.11)
RDW: 12.9 % (ref 11.5–15.5)
WBC: 6.5 10*3/uL (ref 4.0–10.5)

## 2021-07-23 LAB — COMPREHENSIVE METABOLIC PANEL
ALT: 13 U/L (ref 0–35)
AST: 17 U/L (ref 0–37)
Albumin: 4.3 g/dL (ref 3.5–5.2)
Alkaline Phosphatase: 71 U/L (ref 39–117)
BUN: 19 mg/dL (ref 6–23)
CO2: 27 mEq/L (ref 19–32)
Calcium: 10.9 mg/dL — ABNORMAL HIGH (ref 8.4–10.5)
Chloride: 99 mEq/L (ref 96–112)
Creatinine, Ser: 1.04 mg/dL (ref 0.40–1.20)
GFR: 53.51 mL/min — ABNORMAL LOW (ref >60.00)
Glucose, Bld: 92 mg/dL (ref 70–99)
Potassium: 5.5 mEq/L — ABNORMAL HIGH (ref 3.5–5.1)
Sodium: 133 mEq/L — ABNORMAL LOW (ref 135–145)
Total Bilirubin: 0.7 mg/dL (ref 0.2–1.2)
Total Protein: 7 g/dL (ref 6.0–8.3)

## 2021-07-23 LAB — LIPID PANEL
Cholesterol: 123 mg/dL (ref 0–200)
HDL: 49.4 mg/dL (ref >39.00)
LDL Cholesterol: 50 mg/dL (ref 0–99)
NonHDL: 73.26
Total CHOL/HDL Ratio: 2
Triglycerides: 116 mg/dL (ref 0.0–149.0)
VLDL: 23.2 mg/dL (ref 0.0–40.0)

## 2021-07-23 LAB — TSH: TSH: 1.1 u[IU]/mL (ref 0.35–5.50)

## 2021-07-23 LAB — VITAMIN B12: Vitamin B-12: 705 pg/mL (ref 211–911)

## 2021-07-23 LAB — VITAMIN D 25 HYDROXY (VIT D DEFICIENCY, FRACTURES): VITD: 26.78 ng/mL — ABNORMAL LOW (ref 30.00–100.00)

## 2021-07-24 DIAGNOSIS — R0602 Shortness of breath: Secondary | ICD-10-CM | POA: Diagnosis not present

## 2021-07-24 LAB — HEMOGLOBIN A1C: Hgb A1c MFr Bld: 5.6 % (ref 4.6–6.5)

## 2021-07-24 NOTE — Progress Notes (Signed)
Daily Session Note  Patient Details  Name: Diana Cook MRN: [id number] Date of Birth: 03/11/1950 Referring Provider:   Flowsheet Row Pulmonary Rehab from 05/16/2021 in [facility] Cardiac and Pulmonary Rehab  Referring Provider Patsey Berthold       Encounter Date: 07/24/2021  Check In:  Session Check In - 07/24/21 0919       Check-In   Supervising physician immediately available to respond to emergencies See telemetry face sheet for immediately available ER MD    Location [facility]-Cardiac & Pulmonary Rehab    Staff Present Birdie Sons, MPA, Nino Glow, MS, ASCM CEP, Exercise Physiologist;Joseph Tessie Fass, [place]    Virtual Visit No    Medication changes reported     No    Fall or balance concerns reported    No    Tobacco Cessation No Change    Warm-up and Cool-down Performed on first and last piece of equipment    Resistance Training Performed Yes    VAD Patient? No    PAD/SET Patient? No      Pain Assessment   Currently in Pain? No/denies                Social History   Tobacco Use  Smoking Status Former   Packs/day: 1.50   Years: 42.00   Pack years: 63.00   Types: Cigarettes   Quit date: 11/11/2010   Years since quitting: 10.7  Smokeless Tobacco Never    Goals Met:  Independence with exercise equipment Exercise tolerated well No report of concerns or symptoms today Strength training completed today  Goals Unmet:  Not Applicable  Comments: Pt able to follow exercise prescription today without complaint.  Will continue to monitor for progression.    Dr. Emily Filbert is Medical Director for [place].  Dr. Ottie Glazier is Medical Director for [facility] Pulmonary Rehabilitation.

## 2021-07-25 ENCOUNTER — Encounter: Payer: Medicare Other | Attending: Pulmonary Disease

## 2021-07-25 DIAGNOSIS — R0602 Shortness of breath: Secondary | ICD-10-CM | POA: Insufficient documentation

## 2021-07-25 NOTE — Progress Notes (Signed)
Daily Session Note  Patient Details  Name: Diana Cook MRN: [id number] Date of Birth: 06-22-1950 Referring Provider:   Flowsheet Row Pulmonary Rehab from 05/16/2021 in [facility] Cardiac and Pulmonary Rehab  Referring Provider Patsey Berthold       Encounter Date: 07/25/2021  Check In:  Session Check In - 07/25/21 0915       Check-In   Supervising physician immediately available to respond to emergencies See telemetry face sheet for immediately available ER MD    Location [facility]-Cardiac & Pulmonary Rehab    Staff Present Birdie Sons, MPA, RN;Noah Tickle, BS, Exercise Physiologist;Kara Eliezer Bottom, MS, ASCM CEP, Exercise Physiologist;Laureen Owens Shark, BS, RRT, CPFT    Virtual Visit No    Medication changes reported     No    Fall or balance concerns reported    No    Tobacco Cessation No Change    Warm-up and Cool-down Performed on first and last piece of equipment    Resistance Training Performed Yes    VAD Patient? No    PAD/SET Patient? No      Pain Assessment   Currently in Pain? No/denies                Social History   Tobacco Use  Smoking Status Former   Packs/day: 1.50   Years: 42.00   Pack years: 63.00   Types: Cigarettes   Quit date: 11/11/2010   Years since quitting: 10.7  Smokeless Tobacco Never    Goals Met:  Independence with exercise equipment Exercise tolerated well No report of concerns or symptoms today Strength training completed today  Goals Unmet:  Not Applicable  Comments: Pt able to follow exercise prescription today without complaint.  Will continue to monitor for progression.    Dr. Emily Filbert is Medical Director for [facility].  Dr. Ottie Glazier is Medical Director for [facility] Pulmonary Rehabilitation.

## 2021-07-29 ENCOUNTER — Ambulatory Visit (INDEPENDENT_AMBULATORY_CARE_PROVIDER_SITE_OTHER): Payer: Medicare Other | Admitting: Family Medicine

## 2021-07-29 ENCOUNTER — Encounter: Payer: Self-pay | Admitting: Family Medicine

## 2021-07-29 VITALS — BP 108/70 | HR 61 | Ht 67.5 in | Wt 193.0 lb

## 2021-07-29 DIAGNOSIS — I5032 Chronic diastolic (congestive) heart failure: Secondary | ICD-10-CM | POA: Diagnosis not present

## 2021-07-29 DIAGNOSIS — R7309 Other abnormal glucose: Secondary | ICD-10-CM

## 2021-07-29 DIAGNOSIS — I2510 Atherosclerotic heart disease of native coronary artery without angina pectoris: Secondary | ICD-10-CM

## 2021-07-29 DIAGNOSIS — N3000 Acute cystitis without hematuria: Secondary | ICD-10-CM | POA: Diagnosis not present

## 2021-07-29 DIAGNOSIS — Z79899 Other long term (current) drug therapy: Secondary | ICD-10-CM

## 2021-07-29 DIAGNOSIS — J432 Centrilobular emphysema: Secondary | ICD-10-CM | POA: Diagnosis not present

## 2021-07-29 DIAGNOSIS — R5382 Chronic fatigue, unspecified: Secondary | ICD-10-CM

## 2021-07-29 DIAGNOSIS — M810 Age-related osteoporosis without current pathological fracture: Secondary | ICD-10-CM | POA: Diagnosis not present

## 2021-07-29 DIAGNOSIS — R569 Unspecified convulsions: Secondary | ICD-10-CM | POA: Diagnosis not present

## 2021-07-29 DIAGNOSIS — R197 Diarrhea, unspecified: Secondary | ICD-10-CM

## 2021-07-29 DIAGNOSIS — I480 Paroxysmal atrial fibrillation: Secondary | ICD-10-CM | POA: Diagnosis not present

## 2021-07-29 DIAGNOSIS — R82998 Other abnormal findings in urine: Secondary | ICD-10-CM

## 2021-07-29 DIAGNOSIS — Z1211 Encounter for screening for malignant neoplasm of colon: Secondary | ICD-10-CM

## 2021-07-29 DIAGNOSIS — J9611 Chronic respiratory failure with hypoxia: Secondary | ICD-10-CM

## 2021-07-29 DIAGNOSIS — E785 Hyperlipidemia, unspecified: Secondary | ICD-10-CM

## 2021-07-29 DIAGNOSIS — E875 Hyperkalemia: Secondary | ICD-10-CM | POA: Diagnosis not present

## 2021-07-29 LAB — BASIC METABOLIC PANEL
BUN: 21 mg/dL (ref 6–23)
CO2: 25 mEq/L (ref 19–32)
Calcium: 11 mg/dL — ABNORMAL HIGH (ref 8.4–10.5)
Chloride: 98 mEq/L (ref 96–112)
Creatinine, Ser: 1.08 mg/dL (ref 0.40–1.20)
GFR: 51.14 mL/min — ABNORMAL LOW (ref >60.00)
Glucose, Bld: 99 mg/dL (ref 70–99)
Potassium: 5 mEq/L (ref 3.5–5.1)
Sodium: 131 mEq/L — ABNORMAL LOW (ref 135–145)

## 2021-07-29 LAB — POC URINALSYSI DIPSTICK (AUTOMATED)
Bilirubin, UA: NEGATIVE
Blood, UA: NEGATIVE
Glucose, UA: NEGATIVE
Ketones, UA: NEGATIVE
Nitrite, UA: NEGATIVE
Protein, UA: NEGATIVE
Spec Grav, UA: 1.02 (ref 1.010–1.025)
Urobilinogen, UA: 0.2 E.U./dL
pH, UA: 6 (ref 5.0–8.0)

## 2021-07-29 NOTE — Progress Notes (Unsigned)
   Subjective:    Patient ID: Diana Cook, female    DOB: 1950/08/14, 71 y.o.   MRN: [id number]  HPI Pt presents for annual f/u of chronic medical problems  Also tired   Wt Readings from Last 3 Encounters:  07/29/21 193 lb (87.5 kg)  07/17/21 198 lb 4 oz (89.9 kg)  07/11/21 193 lb (87.5 kg)   29.78 kg/m  Not feeling well overall  Having some stabbing pains in R upper chest, saw Dr Bary Castilla for scar tissue/ may consider surgery for that   On eliquis now  Not felt well since starting that (? Coincidence)  Cardiology f/u is tomorrow Will disc her recent echo   Leaving Sunday for 2 weeks at the beach   Parkinson's runs strongly in the family   Got over severe uti  Got better but now tired again  Urine is stronger now  No burning  No frequency or urgency   More diarrhea now  Liquid and loose  No blood in stool   Amw reviewed  Zoster status  Tetanus posponed for insurance   Colonoscopy 07/2018 with 3 y recall- due late this month  Mammogram 12/2020   Dexa 01/2018 OP in [place]/fx Supplements Vit D level is low at 26.7 Exercise  Last calcium level was high at 10.9   CHF with paroxysmal a fib-now just on lasix as needed  Grade 1 DD   BP Readings from Last 3 Encounters:  07/29/21 108/70  07/17/21 116/64  07/11/21 118/72   Pulse Readings from Last 3 Encounters:  07/29/21 61  07/17/21 77  07/11/21 83   She tends towards low bp  Energy level is low -trying to do her pulm rehab (before that was getting stronger)    Lab Results  Component Value Date   CREATININE 1.04 07/23/2021   BUN 19 07/23/2021   NA 133 (L) 07/23/2021   K 5.5 (H) 07/23/2021   CL 99 07/23/2021   CO2 27 07/23/2021   Lab Results  Component Value Date   ALT 13 07/23/2021   AST 17 07/23/2021   ALKPHOS 71 07/23/2021   BILITOT 0.7 07/23/2021     Copd Albuterol  Stiolto  Under care of pulmonary  Is also in pulmonary rehab   GERD Omeprazole 40 mg daily  B12 is in the  normal range   H/o seizures Takes keppra   Elevated glucose Lab Results  Component Value Date   HGBA1C 5.6 07/23/2021   Hyperlipidemia Lab Results  Component Value Date   CHOL 123 07/23/2021   CHOL 161 03/09/2020   CHOL 136 11/16/2019   Lab Results  Component Value Date   HDL 49.40 07/23/2021   HDL 65.90 03/09/2020   HDL 57 11/16/2019   Lab Results  Component Value Date   LDLCALC 50 07/23/2021   LDLCALC 72 03/09/2020   LDLCALC 59 11/16/2019   Lab Results  Component Value Date   TRIG 116.0 07/23/2021   TRIG 120.0 03/09/2020   TRIG 109 11/16/2019   Lab Results  Component Value Date   CHOLHDL 2 07/23/2021   CHOLHDL 2 03/09/2020   CHOLHDL 2.4 11/16/2019   No results found for: LDLDIRECT Atorvastatin 40 mg daily   Balance is bad   A lot of problems with L knee pain  Review of Systems     Objective:   Physical Exam        Assessment & Plan:

## 2021-07-29 NOTE — Patient Instructions (Addendum)
Stop the calcium  Take vitamin D 2000 iu daily   Start a probiotic over the counter  Align -as directed   Urinalysis today  Stool test for cdiff today   Drink fluids   Lab for calcium and potassium  Please follow up in the next 1-2 weeks  We will need to discuss a colonoscopy   See cardiology as planned

## 2021-07-30 ENCOUNTER — Encounter: Payer: Self-pay | Admitting: Internal Medicine

## 2021-07-30 ENCOUNTER — Ambulatory Visit (INDEPENDENT_AMBULATORY_CARE_PROVIDER_SITE_OTHER): Payer: Medicare Other | Admitting: Internal Medicine

## 2021-07-30 ENCOUNTER — Other Ambulatory Visit: Payer: Medicare Other

## 2021-07-30 ENCOUNTER — Encounter: Payer: Medicare Other | Admitting: *Deleted

## 2021-07-30 VITALS — BP 105/68 | HR 80 | Ht 68.5 in | Wt 194.0 lb

## 2021-07-30 DIAGNOSIS — I442 Atrioventricular block, complete: Secondary | ICD-10-CM | POA: Diagnosis not present

## 2021-07-30 DIAGNOSIS — I2510 Atherosclerotic heart disease of native coronary artery without angina pectoris: Secondary | ICD-10-CM | POA: Diagnosis not present

## 2021-07-30 DIAGNOSIS — Z1211 Encounter for screening for malignant neoplasm of colon: Secondary | ICD-10-CM | POA: Insufficient documentation

## 2021-07-30 DIAGNOSIS — I5032 Chronic diastolic (congestive) heart failure: Secondary | ICD-10-CM | POA: Diagnosis not present

## 2021-07-30 DIAGNOSIS — I951 Orthostatic hypotension: Secondary | ICD-10-CM | POA: Diagnosis not present

## 2021-07-30 DIAGNOSIS — R197 Diarrhea, unspecified: Secondary | ICD-10-CM

## 2021-07-30 DIAGNOSIS — R0602 Shortness of breath: Secondary | ICD-10-CM

## 2021-07-30 DIAGNOSIS — Z950 Presence of cardiac pacemaker: Secondary | ICD-10-CM | POA: Diagnosis not present

## 2021-07-30 LAB — URINE CULTURE
MICRO NUMBER:: 13483609
SPECIMEN QUALITY:: ADEQUATE

## 2021-07-30 LAB — PACEMAKER DEVICE OBSERVATION

## 2021-07-30 LAB — PTH, INTACT AND CALCIUM
Calcium: 10.6 mg/dL — ABNORMAL HIGH (ref 8.6–10.4)
PTH: 21 pg/mL (ref 16–77)

## 2021-07-30 NOTE — Assessment & Plan Note (Signed)
Ua looks clearer now after tx Culture pending  Feeling poorly again (energy wise)

## 2021-07-30 NOTE — Assessment & Plan Note (Signed)
Disc goals for lipids and reasons to control them Rev last labs with pt Rev low sat fat diet in detail Stable with atorvastatin 40 mg daily  Tolerates it

## 2021-07-30 NOTE — Assessment & Plan Note (Signed)
Lab Results  Component Value Date   VITAMINB12 705 07/23/2021   D level is 26.7 Continue supplementation

## 2021-07-30 NOTE — Assessment & Plan Note (Signed)
This may be a lab error  Renal fxn numbers were otherwise ok   bmet today

## 2021-07-30 NOTE — Assessment & Plan Note (Signed)
inst to hold ca supplement  Continue vit D Lab today for ca and PTH Pt does have OP

## 2021-07-30 NOTE — Assessment & Plan Note (Signed)
New since treating uti   Liquid/loose stool but no blood   c diff test sent  inst to try probiotic  Bland diet  Hydration-most imp  ER precautions /disc s/s of dehydratoin

## 2021-07-30 NOTE — Assessment & Plan Note (Signed)
Lab Results  Component Value Date   HGBA1C 5.6 07/23/2021   disc imp of low glycemic diet and wt loss to prevent DM2

## 2021-07-30 NOTE — Progress Notes (Signed)
Daily Session Note  Patient Details  Name: Diana Cook MRN: [id number] Date of Birth: 1950-09-05 Referring Provider:   Flowsheet Row Pulmonary Rehab from 05/16/2021 in [facility] Cardiac and Pulmonary Rehab  Referring Provider Patsey Berthold       Encounter Date: 07/30/2021  Check In:  Session Check In - 07/30/21 0930       Check-In   Supervising physician immediately available to respond to emergencies See telemetry face sheet for immediately available ER MD    Location [facility]-Cardiac & Pulmonary Rehab    Staff Present Nyoka Cowden, RN, BSN, Jennye Moccasin, MPA, Mauricia Area, BS, ACSM CEP, Exercise Physiologist    Virtual Visit No    Medication changes reported     No    Fall or balance concerns reported    No    Tobacco Cessation No Change    Warm-up and Cool-down Performed on first and last piece of equipment    Resistance Training Performed Yes    VAD Patient? No    PAD/SET Patient? No      Pain Assessment   Currently in Pain? No/denies                Social History   Tobacco Use  Smoking Status Former   Packs/day: 1.50   Years: 42.00   Pack years: 63.00   Types: Cigarettes   Quit date: 11/11/2010   Years since quitting: 10.7  Smokeless Tobacco Never    Goals Met:  Independence with exercise equipment Exercise tolerated well No report of concerns or symptoms today  Goals Unmet:  Not Applicable  Comments: .exgoo   Dr. Emily Filbert is Medical Director for [place].  Dr. Ottie Glazier is Medical Director for [facility] Pulmonary Rehabilitation.

## 2021-07-30 NOTE — Assessment & Plan Note (Signed)
dexa was 2019  Need to consider a f/u (when she feels better)  Ca level is high persistently  inst to hold oral calcium  Ca and PTH drawn today

## 2021-07-30 NOTE — Progress Notes (Signed)
Patient ID: Diana Cook, female   DOB: 03/23/1950, 71 y.o.   MRN: [id number]      Patient Care Team: Tower, Wynelle Fanny, MD as PCP - General (Family Medicine) End, Harrell Gave, MD as PCP - Cardiology (Cardiology) Deboraha Sprang, MD as Consulting Physician (Cardiology) Lorelee Cover., MD as Referring Physician (Ophthalmology) Tower, Wynelle Fanny, MD as Consulting Physician (Family Medicine) Bary Castilla Forest Gleason, MD (General Surgery)   HPI  Diana Cook is a 71 y.o. female Seen by both Drs. Allred and Lovena Le in the past who developed complete heart block and underwent pacing fall 2013 [facility]; underwent gen change 10/21; also Orthostatic hypotension >> Eval for orthostasis and CHB  >>   SPEP normal; cortisol normal    Her daughter  developed cancer;  died Apr 06, 2022; devastating.  She meets with her grandchildren regularly for breakfast.   Not able to exercise 2/2 back pain; gaining weight    She have been experiencing peripheral edema   She is still grieving over her daughter [name] death    Granddaughter [name] finished at  [employer] for dental school  [name] will be going to highschool   She is now on oxygen for centrilobular emphysema and chronic respiratory insufficiency.  Improved on rehab.  Attributed to cigarettes.  Lassitude exercise intolerance with weakness upon standing.  Aggravated by recent UTI.  Also orthopedic limitations to effort DATE TEST EF   9/13 LHC  No Obstr CAD ( by report)   9/13 Echo   55-65 %   12/14 Echo   55-65 %   8/18 Echo  55-60%   7/19 Echo  50-55%   10/20 CTA  CaScore 521 ? Severe stenosis  5/21 Echo  55-60%   5/21 LHC  LADm-30: OM2p-20  5/23 Echo  55-60%     Date Cr K Hgb  8//18 0.83 4.4    10/19 0.95 4.5   1/22 1.06 5.1   6/23 1.08 5.0 13.5     Past Medical History:  Diagnosis Date   Allergic rhinitis    Arthritis    Asthma    as a child, mild now   Breast cancer ([place]) 12/2015   right breast cancer, lumpectomy and  mammosite    Colon polyps    colonoscopy 7/08, tubular adenoma   Complete heart block ([place] Cook) 09/2011   s/p PPM implanted in [place] Cook   COPD (chronic obstructive pulmonary disease) ([place])    Myocardial infarction ([place]) 2011   Osteopenia 10/2015   Pacemaker    2011   Paroxysmal atrial fibrillation ([place]) 03/2021   Incidentally detected on pacemaker interrogation   Personal history of radiation therapy 2017   right breast ca, mammosite placed   Seizure disorder ([place])    Seizures ([place])    first one was when she was 71 years old    Small bowel obstruction ([place])    1988 and 2002   Tobacco abuse     Past Surgical History:  Procedure Laterality Date   ABDOMINAL HYSTERECTOMY     BREAST BIOPSY Right 2007   benign inflammatory changes, mass due to underwire bra   BREAST BIOPSY Left 01/02/2016   columnar cell changes without atypical hyperplasia.   BREAST BIOPSY Right 12/06/2015   rt breast mass 10:00, bx done at Dr. Curly [place] office, invasive ductal carcinoma   BREAST EXCISIONAL BIOPSY Left 01/02/2016   COLUMNAR CELL CHANGE AND HYPERPLASIA ASSOCIATED WITH LUMINAL AND STROMAL CALCIFICATIONS   BREAST LUMPECTOMY Right  01/02/2016   invasive mammary carcinoma, clear margins, negative LN   BREAST LUMPECTOMY WITH SENTINEL LYMPH NODE BIOPSY Right 01/02/2016   pT1c, N0; ER/ PR 100%; Her 2 neu not over expressed: BREAST LUMPECTOMY WITH SENTINEL LYMPH NODE BX;  Surgeon: [name], MD;  Location: [facility] ORS;  Service: General;  Laterality: Right;   CARDIAC CATHETERIZATION     CATARACT EXTRACTION Bilateral    COLONOSCOPY  10/2015   Dr Ardis Hughs   COLONOSCOPY WITH PROPOFOL N/A 08/19/2018   Procedure: COLONOSCOPY WITH PROPOFOL;  Surgeon: Virgel Manifold, MD;  Location: [facility] ENDOSCOPY;  Service: Endoscopy;  Laterality: N/A;   ESOPHAGOGASTRODUODENOSCOPY (EGD) WITH PROPOFOL N/A 08/19/2018   Procedure: ESOPHAGOGASTRODUODENOSCOPY (EGD) WITH PROPOFOL;  Surgeon: Virgel Manifold, MD;  Location:  [facility] ENDOSCOPY;  Service: Endoscopy;  Laterality: N/A;   EXPLORATORY LAPAROTOMY  01/25/2001   Exploratory laparotomy, lysis of adhesions, identification of internal hernia secondary to omental adhesion. Prolonged postoperative ileus.   gyn surgery  1993   hysterectomy- form endometriosis   LAPAROSCOPY     PACEMAKER INSERTION  10/24/11   [employer] Advantio dual chamber PPM implanted by Dr Bunnie Philips at [facility] in [place] N/A 12/05/2019   Procedure: [place];  Surgeon: Deboraha Sprang, MD;  Location: [place] [facility] LAB;  Service: Cardiovascular;  Laterality: N/A;   RIGHT/LEFT HEART CATH AND CORONARY ANGIOGRAPHY Bilateral 07/19/2019   Procedure: RIGHT/LEFT HEART CATH AND CORONARY ANGIOGRAPHY;  Surgeon: Nelva Bush, MD;  Location: [place] [facility];  Service: Cardiovascular;  Laterality: Bilateral;   TEMPORARY PACEMAKER N/A 12/05/2019   Procedure: TEMPORARY PACEMAKER;  Surgeon: Deboraha Sprang, MD;  Location: [place] CV LAB;  Service: Cardiovascular;  Laterality: N/A;    Current Outpatient Medications  Medication Sig Dispense Refill   acetaminophen (TYLENOL) 500 MG tablet Take 500-1,000 mg by mouth every 6 (six) hours as needed for mild pain or headache.      albuterol (VENTOLIN HFA) 108 (90 Base) MCG/ACT inhaler Inhale 2 puffs into the lungs every 4 (four) hours as needed for wheezing or shortness of breath. 18 g 1   apixaban (ELIQUIS) 5 MG TABS tablet Take 1 tablet by mouth twice daily 60 tablet 5   atorvastatin (LIPITOR) 40 MG tablet Take 1 tablet by mouth once daily 90 tablet 1   benzonatate (TESSALON) 100 MG capsule Take by mouth as needed.     FLUoxetine (PROZAC) 20 MG capsule Take 1 capsule by mouth once daily 90 capsule 1   furosemide (LASIX) 40 MG tablet TAKE 1 TABLET BY MOUTH ONCE DAILY AS NEEDED 90 tablet 0   levETIRAcetam (KEPPRA) 250 MG tablet Take 250 mg by mouth 2 (two) times daily.      omeprazole  (PRILOSEC) 40 MG capsule Take 40 mg by mouth daily.     OXYGEN Inhale 3 L into the lungs at bedtime.      STIOLTO RESPIMAT 2.5-2.5 MCG/ACT AERS INHALE 2 PUFFS BY MOUTH ONCE DAILY 4 g 0   tretinoin (RETIN-A) 0.1 % cream Apply topically at bedtime. 45 g 1   VITAMIN D PO Take 1 tablet by mouth daily.     zolpidem (AMBIEN) 10 MG tablet TAKE 1/2 TO 1 (ONE-HALF TO ONE) TABLET BY MOUTH AT BEDTIME AS NEEDED FOR SLEEP 30 tablet 3   No current facility-administered medications for this visit.    Allergies  Allergen Reactions   Codeine Nausea And Vomiting   Morphine And Related  Nausea Only    Review of Systems negative except from HPI and PMH  Physical Exam: BP 105/68 (BP Location: Left Arm, Patient Position: Sitting, Cuff Size: Normal)   Pulse 80   Ht 5' 8.5" (1.74 m)   Wt 194 lb (88 kg)   SpO2 96% Comment: on 3L O2  BMI 29.07 kg/m  Well developed and well nourished in no acute distress  wearing O2 HENT normal Neck supple with JVP-flat Clear Device pocket well healed; without hematoma or erythema.  There is no tethering  Regular rate and rhythm, no  murmur Abd-soft with active BS No Clubbing cyanosis tr edema Skin-warm and dry A & Oriented  Grossly normal sensory and motor function  ECG sinus with P synchronous pacing at 80 17/17/40    Assessment and  Plan:  High-grade heart block  Pacemaker-[employer]    Sinus node dysfunction and chronotropic incompetence   Exercise intolerance  Orthostatic hypotension// presyncope  Ventricular tachycardia-nonsustained  Grieving   Orthostasis better symptomatically, but with significant weakness I wonder to what degree it is contributing. We discussed the physiology of orthostatic intolerance including gravitational fluid shifts and the impact of hypertensive vascular disease on orthostasis and treatment options.    We discussed nonpharmacological options including raising the HOB, isometric contraction upon standing,  abdominal binders  thigh sleeves. We emphasized the importance of recognizing the prodrome and sitting prior to falling, safety in the shower and in the bathroom and the avoidance of dehydration   We will begin with a nonpharmacological approach with abdominal binders and thigh sleeves from spanx  Atrial fibrillation burden is relatively modest at about 1%.  Per Dr. Darnelle Bos question, do not think that there is a role for specific antiarrhythmic therapy.  We will continue the Eliquis at 5 mg twice daily.  Using her diuretics as needed at 40 mg a day.  I have wondered whether her Keppra might be contributing to her fatigue.  I suggested she talk about this with PCP.  Heart rate excursion is adequate with a pacemaker in the DDD mode The patient's device was interrogated.  The information was reviewed. No changes were made in the programming.       VT nonsustained prognostically is not much of an issue with normal LV function

## 2021-07-30 NOTE — Patient Instructions (Signed)
Medication Instructions:  - Your physician recommends that you continue on your current medications as directed. Please refer to the Current Medication list given to you today.  *If you need a refill on your cardiac medications before your next appointment, please call your pharmacy*   Lab Work: - none ordered  If you have labs (blood work) drawn today and your tests are completely normal, you will receive your results only by: [place] (if you have MyChart) OR A paper copy in the mail If you have any lab test that is abnormal or we need to change your treatment, we will call you to review the results.   Testing/Procedures: - none ordered   Follow-Up: At [facility], you and your health needs are our priority.  As part of our continuing mission to provide you with exceptional heart care, we have created designated Provider Care Teams.  These Care Teams include your primary Cardiologist (physician) and Advanced Practice Providers (APPs -  Physician Assistants and Nurse Practitioners) who all work together to provide you with the care you need, when you need it.  We recommend signing up for the patient portal called "MyChart".  Sign up information is provided on this After Visit Summary.  MyChart is used to connect with patients for Virtual Visits (Telemedicine).  Patients are able to view lab/test results, encounter notes, upcoming appointments, etc.  Non-urgent messages can be sent to your provider as well.   To learn more about what you can do with MyChart, go to [email or web address].    Your next appointment:   1 year(s)  The format for your next appointment:   In Person  Provider:   Virl Axe, MD    Other Instructions  1) It is recommended that you wear an abdominal binder/ thigh sleeves during your waking hours  Important Information About Sugar

## 2021-07-30 NOTE — Assessment & Plan Note (Signed)
Suspect multifactorial Still recovering from severe uti  Labs done and reviewed  F/u planned with cardiology  New diarrhea (? From abx)  Disc s/s of dehydration to watch for

## 2021-07-30 NOTE — Assessment & Plan Note (Signed)
Continues neuro care and keppra  No recent seizures

## 2021-07-30 NOTE — Assessment & Plan Note (Signed)
Under care of pulmonary  02 ATC now  stiolto and albuterol  In pulmonary rehab

## 2021-07-30 NOTE — Assessment & Plan Note (Signed)
Continues ATC 02 now

## 2021-07-30 NOTE — Assessment & Plan Note (Signed)
Colonoscopy 07/2018 with 3 y recall  She is not feeling well enough to schedule this yet

## 2021-07-30 NOTE — Assessment & Plan Note (Signed)
Now taking eliquis  For cardiology f/u tomorrow

## 2021-07-30 NOTE — Assessment & Plan Note (Signed)
With paroxysmal a fib  Now takes lasix as needed Under care of cardiology

## 2021-07-31 LAB — CUP PACEART INCLINIC DEVICE CHECK
Date Time Interrogation Session: 20230606000000
Implantable Lead Implant Date: 20130830
Implantable Lead Implant Date: 20130830
Implantable Lead Location: 753859
Implantable Lead Location: 753860
Implantable Lead Model: 4456
Implantable Lead Model: 4479
Implantable Lead Serial Number: 473325
Implantable Lead Serial Number: 523784
Implantable Pulse Generator Implant Date: 20211011
Lead Channel Impedance Value: 490 Ohm
Lead Channel Impedance Value: 624 Ohm
Lead Channel Pacing Threshold Amplitude: 0.6 V
Lead Channel Pacing Threshold Amplitude: 0.6 V
Lead Channel Pacing Threshold Pulse Width: 0.4 ms
Lead Channel Pacing Threshold Pulse Width: 0.4 ms
Lead Channel Sensing Intrinsic Amplitude: 4.6 mV
Lead Channel Setting Pacing Amplitude: 2 V
Lead Channel Setting Pacing Amplitude: 2 V
Lead Channel Setting Pacing Pulse Width: 0.4 ms
Lead Channel Setting Sensing Sensitivity: 2.5 mV
Pulse Gen Serial Number: 949303

## 2021-07-31 LAB — C. DIFFICILE GDH AND TOXIN A/B
GDH ANTIGEN: NOT DETECTED
MICRO NUMBER:: 13489453
SPECIMEN QUALITY:: ADEQUATE
TOXIN A AND B: NOT DETECTED

## 2021-08-01 ENCOUNTER — Ambulatory Visit (INDEPENDENT_AMBULATORY_CARE_PROVIDER_SITE_OTHER): Payer: Medicare Other | Admitting: Medical

## 2021-08-01 ENCOUNTER — Encounter: Payer: Self-pay | Admitting: Medical

## 2021-08-01 VITALS — BP 100/60 | HR 78 | Ht 68.5 in | Wt 195.4 lb

## 2021-08-01 DIAGNOSIS — R42 Dizziness and giddiness: Secondary | ICD-10-CM

## 2021-08-01 DIAGNOSIS — I2510 Atherosclerotic heart disease of native coronary artery without angina pectoris: Secondary | ICD-10-CM | POA: Diagnosis not present

## 2021-08-01 DIAGNOSIS — I442 Atrioventricular block, complete: Secondary | ICD-10-CM

## 2021-08-01 DIAGNOSIS — R0602 Shortness of breath: Secondary | ICD-10-CM

## 2021-08-01 DIAGNOSIS — I480 Paroxysmal atrial fibrillation: Secondary | ICD-10-CM | POA: Diagnosis not present

## 2021-08-01 NOTE — Patient Instructions (Signed)
Medication Instructions:  Your physician recommends that you continue on your current medications as directed. Please refer to the Current Medication list given to you today.   *If you need a refill on your cardiac medications before your next appointment, please call your pharmacy*   Lab Work: None ordered  If you have labs (blood work) drawn today and your tests are completely normal, you will receive your results only by: [place] (if you have MyChart) OR A paper copy in the mail If you have any lab test that is abnormal or we need to change your treatment, we will call you to review the results.   Testing/Procedures: None ordered  Follow-Up: At [facility], you and your health needs are our priority.  As part of our continuing mission to provide you with exceptional heart care, we have created designated Provider Care Teams.  These Care Teams include your primary Cardiologist (physician) and Advanced Practice Providers (APPs -  Physician Assistants and Nurse Practitioners) who all work together to provide you with the care you need, when you need it.  We recommend signing up for the patient portal called "MyChart".  Sign up information is provided on this After Visit Summary.  MyChart is used to connect with patients for Virtual Visits (Telemedicine).  Patients are able to view lab/test results, encounter notes, upcoming appointments, etc.  Non-urgent messages can be sent to your provider as well.   To learn more about what you can do with MyChart, go to [email or web address].    Your next appointment:   6 month(s)  The format for your next appointment:   In Person  Provider:   You may see Nelva Bush, MD or one of the following Advanced Practice Providers on your designated Care Team:   Murray Hodgkins, NP Christell Faith, PA-C Cadence Kathlen Mody, PA-C{  Important Information About Sugar

## 2021-08-01 NOTE — Progress Notes (Signed)
Daily Session Note  Patient Details  Name: Diana Cook MRN: [id number] Date of Birth: 1950/10/05 Referring Provider:   Flowsheet Row Pulmonary Rehab from 05/16/2021 in [facility] Cardiac and Pulmonary Rehab  Referring Provider Patsey Berthold       Encounter Date: 08/01/2021  Check In:  Session Check In - 08/01/21 0930       Check-In   Supervising physician immediately available to respond to emergencies See telemetry face sheet for immediately available ER MD    Location [facility]-Cardiac & Pulmonary Rehab    Staff Present Earlean Shawl, BS, ACSM CEP, Exercise Physiologist;Kara Eliezer Bottom, MS, ASCM CEP, Exercise Physiologist;Khyree Carillo Rosalia Hammers, MPA, RN    Virtual Visit No    Medication changes reported     No    Fall or balance concerns reported    No    Tobacco Cessation No Change    Warm-up and Cool-down Performed on first and last piece of equipment    Resistance Training Performed Yes    VAD Patient? No    PAD/SET Patient? No      Pain Assessment   Currently in Pain? No/denies                Social History   Tobacco Use  Smoking Status Former   Packs/day: 1.50   Years: 42.00   Total pack years: 63.00   Types: Cigarettes   Quit date: 11/11/2010   Years since quitting: 10.7  Smokeless Tobacco Never    Goals Met:  Independence with exercise equipment Exercise tolerated well No report of concerns or symptoms today Strength training completed today  Goals Unmet:  Not Applicable  Comments: Pt able to follow exercise prescription today without complaint.  Will continue to monitor for progression.    Dr. Emily Filbert is Medical Director for [place].  Dr. Ottie Glazier is Medical Director for [facility] Pulmonary Rehabilitation.

## 2021-08-01 NOTE — Progress Notes (Signed)
Cardiology Office Note:    Date:  08/01/2021   ID:  Diana Cook, DOB 09-Nov-1950, MRN [id number]  PCP:  Abner Greenspan, MD  [facility] Cardiologist:  Nelva Bush, MD  [facility] Electrophysiologist:  None   Referring MD: Abner Greenspan, MD   Chief Complaint: 1 month follow-up  History of Present Illness:    Diana Cook is a 71 y.o. female with a hx of moderate CAD by cardiac CTA 11/2018 and catheterization 06/2019, Paroxysmal Afib, CHB s/p PPM followed by Dr. Caryl Comes, seizure disorder, COPD, right breast cancer who presents for CAD follow-up and Pafib.   Earlier this year pacemaker interrogation showed paroxysmal Afib lasting 6 hours. She was started on Eliquis 5mg  BID  She was seen by Dr. Saunders Revel 07/11/21 and reported she was lightheaded. She had recent diagnosis of cystitis. IT was recommended she take lasix only as needed, and follow-up with EP. Echo was ordered to evaluate for CM, this showed normal LVEF.   She saw Dr. Caryl Comes 6/6 for lightheadedness, he felt was mainly from orthostasis. Afib burden about 1%, no need for AAA.   Today, the patient reports she was treated for another UTI last week with another antibiotic. Since treatment she has not been having any more lightheadedness. She denies dizziness. She has felt palpitations before with AFib, but nothing recently. BP is soft. She had chest pain from prior breast surgery that is chronic. Breathing is stable on supplemental O2.   Past Medical History:  Diagnosis Date   Allergic rhinitis    Arthritis    Asthma    as a child, mild now   Breast cancer ([place]) 12/2015   right breast cancer, lumpectomy and mammosite    Colon polyps    colonoscopy 7/08, tubular adenoma   Complete heart block ([place]) 09/2011   s/p PPM implanted in [facility]   COPD (chronic obstructive pulmonary disease) ([place])    Myocardial infarction ([place]) 2011   Osteopenia 10/2015   Pacemaker    2011   Paroxysmal atrial fibrillation ([place]) 03/2021    Incidentally detected on pacemaker interrogation   Personal history of radiation therapy 2017   right breast ca, mammosite placed   Seizure disorder ([place])    Seizures ([place])    first one was when she was 71 years old    Small bowel obstruction ([place])    1988 and 2002   Tobacco abuse     Past Surgical History:  Procedure Laterality Date   ABDOMINAL HYSTERECTOMY     BREAST BIOPSY Right 2007   benign inflammatory changes, mass due to underwire bra   BREAST BIOPSY Left 01/02/2016   columnar cell changes without atypical hyperplasia.   BREAST BIOPSY Right 12/06/2015   rt breast mass 10:00, bx done at Dr. Curly [place] office, invasive ductal carcinoma   BREAST EXCISIONAL BIOPSY Left 01/02/2016   COLUMNAR CELL CHANGE AND HYPERPLASIA ASSOCIATED WITH LUMINAL AND STROMAL CALCIFICATIONS   BREAST LUMPECTOMY Right 01/02/2016   invasive mammary carcinoma, clear margins, negative LN   BREAST LUMPECTOMY WITH SENTINEL LYMPH NODE BIOPSY Right 01/02/2016   pT1c, N0; ER/ PR 100%; Her 2 neu not over expressed: BREAST LUMPECTOMY WITH SENTINEL LYMPH NODE BX;  Surgeon: [name], MD;  Location: [facility] ORS;  Service: General;  Laterality: Right;   CARDIAC CATHETERIZATION     CATARACT EXTRACTION Bilateral    COLONOSCOPY  10/2015   Dr Ardis Hughs   COLONOSCOPY WITH PROPOFOL N/A 08/19/2018   Procedure: COLONOSCOPY  WITH PROPOFOL;  Surgeon: Virgel Manifold, MD;  Location: [facility] ENDOSCOPY;  Service: Endoscopy;  Laterality: N/A;   ESOPHAGOGASTRODUODENOSCOPY (EGD) WITH PROPOFOL N/A 08/19/2018   Procedure: ESOPHAGOGASTRODUODENOSCOPY (EGD) WITH PROPOFOL;  Surgeon: Virgel Manifold, MD;  Location: [facility] ENDOSCOPY;  Service: Endoscopy;  Laterality: N/A;   EXPLORATORY LAPAROTOMY  01/25/2001   Exploratory laparotomy, lysis of adhesions, identification of internal hernia secondary to omental adhesion. Prolonged postoperative ileus.   gyn surgery  1993   hysterectomy- form endometriosis   LAPAROSCOPY     PACEMAKER  INSERTION  10/24/11   [employer] Advantio dual chamber PPM implanted by Dr Bunnie Philips at [facility] in [place] N/A 12/05/2019   Procedure: [place];  Surgeon: Deboraha Sprang, MD;  Location: [place] [facility];  Service: Cardiovascular;  Laterality: N/A;   RIGHT/LEFT HEART CATH AND CORONARY ANGIOGRAPHY Bilateral 07/19/2019   Procedure: RIGHT/LEFT HEART CATH AND CORONARY ANGIOGRAPHY;  Surgeon: Nelva Bush, MD;  Location: [place] [facility];  Service: Cardiovascular;  Laterality: Bilateral;   TEMPORARY PACEMAKER N/A 12/05/2019   Procedure: TEMPORARY PACEMAKER;  Surgeon: Deboraha Sprang, MD;  Location: [place] [facility];  Service: Cardiovascular;  Laterality: N/A;    Current Medications: Current Meds  Medication Sig   acetaminophen (TYLENOL) 500 MG tablet Take 500-1,000 mg by mouth every 6 (six) hours as needed for mild pain or headache.    albuterol (VENTOLIN HFA) 108 (90 Base) MCG/ACT inhaler Inhale 2 puffs into the lungs every 4 (four) hours as needed for wheezing or shortness of breath.   apixaban (ELIQUIS) 5 MG TABS tablet Take 1 tablet by mouth twice daily   atorvastatin (LIPITOR) 40 MG tablet Take 1 tablet by mouth once daily   benzonatate (TESSALON) 100 MG capsule Take by mouth as needed.   FLUoxetine (PROZAC) 20 MG capsule Take 1 capsule by mouth once daily   furosemide (LASIX) 40 MG tablet TAKE 1 TABLET BY MOUTH ONCE DAILY AS NEEDED   levETIRAcetam (KEPPRA) 250 MG tablet Take 250 mg by mouth 2 (two) times daily.    omeprazole (PRILOSEC) 40 MG capsule Take 40 mg by mouth daily.   OXYGEN Inhale 3 L into the lungs at bedtime.    STIOLTO RESPIMAT 2.5-2.5 MCG/ACT AERS INHALE 2 PUFFS BY MOUTH ONCE DAILY   tretinoin (RETIN-A) 0.1 % cream Apply topically at bedtime.   VITAMIN D PO Take 1 tablet by mouth daily.   zolpidem (AMBIEN) 10 MG tablet TAKE 1/2 TO 1 (ONE-HALF TO ONE) TABLET BY MOUTH AT BEDTIME AS NEEDED FOR  SLEEP     Allergies:   Codeine and Morphine and related   Social History   Socioeconomic History   Marital status: Married    Spouse name: Not on file   Number of children: Not on file   Years of education: Not on file   Highest education level: Not on file  Occupational History    Employer: RETIRED  Tobacco Use   Smoking status: Former    Packs/day: 1.50    Years: 42.00    Total pack years: 63.00    Types: Cigarettes    Quit date: 11/11/2010    Years since quitting: 10.7   Smokeless tobacco: Never  Vaping Use   Vaping Use: Never used  Substance and Sexual Activity   Alcohol use: Yes    Alcohol/week: 2.0 standard drinks of alcohol    Types: 2 Glasses of wine per week  Comment: weekly   Drug use: No   Sexual activity: Never  Other Topics Concern   Not on file  Social History Narrative   Married 40 years.   Exercises, does walking tapes   Social Determinants of Health   Financial Resource Strain: Low Risk  (03/11/2021)   Overall Financial Resource Strain (CARDIA)    Difficulty of Paying Living Expenses: Not hard at all  Food Insecurity: No Food Insecurity (03/11/2021)   Hunger Vital Sign    Worried About Running Out of Food in the Last Year: Never true    Ran Out of Food in the Last Year: Never true  Transportation Needs: No Transportation Needs (03/11/2021)   PRAPARE - Hydrologist (Medical): No    Lack of Transportation (Non-Medical): No  Physical Activity: Insufficiently Active (03/11/2021)   Exercise Vital Sign    Days of Exercise per Week: 7 days    Minutes of Exercise per Session: 20 min  Stress: No Stress Concern Present (03/11/2021)   [employer]    Feeling of Stress : Only a little  Social Connections: Socially Integrated (03/11/2021)   Social Connection and Isolation Panel [NHANES]    Frequency of Communication with Friends and Family: More than three times a  week    Frequency of Social Gatherings with Friends and Family: More than three times a week    Attends Religious Services: More than 4 times per year    Active Member of [employer] or Organizations: Yes    Attends Music therapist: More than 4 times per year    Marital Status: Married     Family History: The patient's family history includes Cancer - Cervical (age of onset: 3) in her daughter; Coronary artery disease in her father; Dementia in her mother; Heart attack (age of onset: 37) in her brother; Heart disease (age of onset: 26) in her mother; Parkinsonism in her father; Stroke in her mother. There is no history of Colon cancer or Breast cancer.  ROS:   Please see the history of present illness.     All other systems reviewed and are negative.  EKGs/Labs/Other Studies Reviewed:    The following studies were reviewed today:  Echo 07/16/21  1. Left ventricular ejection fraction, by estimation, is 55 to 60%. The  left ventricle has normal function. Septal wall motion abnormality  consistent with bundle branch block.   2. There is mild left ventricular hypertrophy. Left ventricular diastolic  parameters are consistent with Grade I diastolic dysfunction (impaired  relaxation).   3. Right ventricular systolic function is normal. The right ventricular  size is normal. There is normal pulmonary artery systolic pressure. The  estimated right ventricular systolic pressure is 61.4 mmHg.   4. The mitral valve is normal in structure. No evidence of mitral valve  regurgitation. No evidence of mitral stenosis.   5. The aortic valve was not well visualized. Aortic valve regurgitation  is not visualized. No aortic stenosis is present.   6. The inferior vena cava is normal in size with greater than 50%  respiratory variability, suggesting right atrial pressure of 3 mmHg.   R/L heart cath 2021 Conclusions: Mild to moderate, non-obstructive coronary artery disease with 30-40% mid LAD  and 20% ostial OM2 stenoses, as well as mild plaquing of the proximal RCA. Normal left ventricular contraction with mildly elevated filling pressure. Mildly elevated right heart and pulmonary artery pressures. Mildly reduced Fick  cardiac output/index. No evidence of significant intracardiac shunt.   Recommendations: Escalate diuresis given mildly elevated left and right heart filling pressures in the setting of diastolic dysfunction. Continue medical therapy and risk factor modification to prevent pr          ogression of coronary artery disease. Ongoing management of underlying lung disease per Dr. Patsey Berthold.   Nelva Bush, MD [facility]  EKG:  EKG is not ordered today.    Recent Labs: 04/11/2021: Magnesium 2.0 07/23/2021: ALT 13; Hemoglobin 13.5; Platelets 314.0; TSH 1.10 07/29/2021: BUN 21; Creatinine, Ser 1.08; Potassium 5.0; Sodium 131  Recent Lipid Panel    Component Value Date/Time   CHOL 123 07/23/2021 1210   CHOL 136 11/16/2019 1433   TRIG 116.0 07/23/2021 1210   HDL 49.40 07/23/2021 1210   HDL 57 11/16/2019 1433   CHOLHDL 2 07/23/2021 1210   VLDL 23.2 07/23/2021 1210   LDLCALC 50 07/23/2021 1210   LDLCALC 59 11/16/2019 1433     Physical Exam:    VS:  BP 100/60 (BP Location: Left Arm, Patient Position: Sitting, Cuff Size: Normal)   Pulse 78   Ht 5' 8.5" (1.74 m)   Wt 195 lb 6 oz (88.6 kg)   SpO2 95%   BMI 29.27 kg/m     Wt Readings from Last 3 Encounters:  08/01/21 195 lb 6 oz (88.6 kg)  07/30/21 194 lb (88 kg)  07/29/21 193 lb (87.5 kg)     GEN:  Well nourished, well developed in no acute distress HEENT: Normal NECK: No JVD; No carotid bruits LYMPHATICS: No lymphadenopathy CARDIAC: RRR, no murmurs, rubs, gallops RESPIRATORY:  Clear to auscultation without rales, wheezing or rhonchi  ABDOMEN: Soft, non-tender, non-distended MUSCULOSKELETAL:  No edema; No deformity  SKIN: Warm and dry NEUROLOGIC:  Alert and oriented x 3 PSYCHIATRIC:  Normal  affect   ASSESSMENT:    1. Lightheadedness   2. Paroxysmal atrial fibrillation (HCC)   3. CAD in native artery   4. Complete heart block (HCC)    PLAN:    In order of problems listed above:  Lightheadedness This has resolved with treatment of UTI last week. Follow-up Echo showed LVEF 55-60%, mild LVH, G1DD, normal RVSF. No further lightheadedness or dizziness. BP is good today. No further work-up.   Paroxysmal Afib In NSR on exam. Continue Eliquis for stroke ppx. Last device check showed less than 1% burden of Afib. Dr. Caryl Comes did not feel AAA was needed on most recent visit.   Nonobstructive CAD Patient has chronic chest pain from prior breast cancer surgeries. No change in breathing. No further ischemic work-up indicated. No ASA with Eliquis. Continue statin therapy.   CHB s/p PPM She is followed by Dr. Caryl Comes.   Disposition: Follow up in 6 month(s) with Md/APP     Signed, Dilraj Killgore Ninfa Meeker, PA-C  08/01/2021 3:31 PM    [facility]

## 2021-08-02 ENCOUNTER — Encounter: Payer: Medicare Other | Admitting: *Deleted

## 2021-08-02 DIAGNOSIS — R0602 Shortness of breath: Secondary | ICD-10-CM

## 2021-08-02 NOTE — Progress Notes (Signed)
Daily Session Note  Patient Details  Name: Diana Cook MRN: [id number] Date of Birth: [birth year] Referring Provider:   Flowsheet Row Pulmonary Rehab from 05/16/2021 in [facility]  Referring Provider Patsey Berthold       Encounter Date: 08/02/2021  Check In:  Session Check In - 08/02/21 0959       Check-In   Supervising physician immediately available to respond to emergencies See telemetry face sheet for immediately available ER MD    Location [facility]    Staff Present Heath Lark, RN, BSN, CCRP;Jessica Arlington, MA, RCEP, CCRP, CCET;[place], [place]    Virtual Visit No    Medication changes reported     No    Fall or balance concerns reported    No    Warm-up and Cool-down Performed on first and last piece of equipment    Resistance Training Performed Yes    VAD Patient? No    PAD/SET Patient? No      Pain Assessment   Currently in Pain? No/denies                Social History   Tobacco Use  Smoking Status Former   Packs/day: 1.50   Years: 42.00   Total pack years: 63.00   Types: Cigarettes   Quit date: 11/11/2010   Years since quitting: 10.7  Smokeless Tobacco Never    Goals Met:  Proper associated with RPD/PD & O2 Sat Independence with exercise equipment Exercise tolerated well No report of concerns or symptoms today  Goals Unmet:  Not Applicable  Comments: Pt able to follow exercise prescription today without complaint.  Will continue to monitor for progression.    Dr. Emily Filbert is Medical Director for [facility].  Dr. Ottie Glazier is Medical Director for [facility].

## 2021-08-06 ENCOUNTER — Encounter: Payer: Medicare Other | Admitting: Internal Medicine

## 2021-08-07 ENCOUNTER — Encounter: Payer: Self-pay | Admitting: *Deleted

## 2021-08-07 DIAGNOSIS — R0602 Shortness of breath: Secondary | ICD-10-CM

## 2021-08-07 NOTE — Progress Notes (Signed)
Pulmonary Individual Treatment Plan  Patient Details  Name: Diana Cook MRN: [id number] Date of Birth: May 28, 1950 Referring Provider:   Flowsheet Row Pulmonary Rehab from 05/16/2021 in [facility] Cardiac and Pulmonary Rehab  Referring Provider Jayme Cloud       Initial Encounter Date:  Flowsheet Row Pulmonary Rehab from 05/16/2021 in [facility] Cardiac and Pulmonary Rehab  Date 05/16/21       Visit Diagnosis: Shortness of breath  Patient's Home Medications on Admission:  Current Outpatient Medications:    acetaminophen (TYLENOL) 500 MG tablet, Take 500-1,000 mg by mouth every 6 (six) hours as needed for mild pain or headache. , Disp: , Rfl:    albuterol (VENTOLIN HFA) 108 (90 Base) MCG/ACT inhaler, Inhale 2 puffs into the lungs every 4 (four) hours as needed for wheezing or shortness of breath., Disp: 18 g, Rfl: 1   apixaban (ELIQUIS) 5 MG TABS tablet, Take 1 tablet by mouth twice daily, Disp: 60 tablet, Rfl: 5   atorvastatin (LIPITOR) 40 MG tablet, Take 1 tablet by mouth once daily, Disp: 90 tablet, Rfl: 1   benzonatate (TESSALON) 100 MG capsule, Take by mouth as needed., Disp: , Rfl:    FLUoxetine (PROZAC) 20 MG capsule, Take 1 capsule by mouth once daily, Disp: 90 capsule, Rfl: 1   furosemide (LASIX) 40 MG tablet, TAKE 1 TABLET BY MOUTH ONCE DAILY AS NEEDED, Disp: 90 tablet, Rfl: 0   levETIRAcetam (KEPPRA) 250 MG tablet, Take 250 mg by mouth 2 (two) times daily. , Disp: , Rfl:    omeprazole (PRILOSEC) 40 MG capsule, Take 40 mg by mouth daily., Disp: , Rfl:    OXYGEN, Inhale 3 L into the lungs at bedtime. , Disp: , Rfl:    STIOLTO RESPIMAT 2.5-2.5 MCG/ACT AERS, INHALE 2 PUFFS BY MOUTH ONCE DAILY, Disp: 4 g, Rfl: 0   tretinoin (RETIN-A) 0.1 % cream, Apply topically at bedtime., Disp: 45 g, Rfl: 1   VITAMIN D PO, Take 1 tablet by mouth daily., Disp: , Rfl:    zolpidem (AMBIEN) 10 MG tablet, TAKE 1/2 TO 1 (ONE-HALF TO ONE) TABLET BY MOUTH AT BEDTIME AS NEEDED FOR SLEEP, Disp: 30 tablet, Rfl:  3  Past Medical History: Past Medical History:  Diagnosis Date   Allergic rhinitis    Arthritis    Asthma    as a child, mild now   Breast cancer (HCC) 12/2015   right breast cancer, lumpectomy and mammosite    Colon polyps    colonoscopy 7/08, tubular adenoma   Complete heart block (HCC) 09/2011   s/p PPM implanted in [facility]   COPD (chronic obstructive pulmonary disease) (HCC)    Myocardial infarction (HCC) 2011   Osteopenia 10/2015   Pacemaker    2011   Paroxysmal atrial fibrillation (HCC) 03/2021   Incidentally detected on pacemaker interrogation   Personal history of radiation therapy 2017   right breast ca, mammosite placed   Seizure disorder (HCC)    Seizures (HCC)    first one was when she was 71 years old    Small bowel obstruction (HCC)    1988 and 2002   Tobacco abuse     Tobacco Use: Social History   Tobacco Use  Smoking Status Former   Packs/day: 1.50   Years: 42.00   Total pack years: 63.00   Types: Cigarettes   Quit date: 11/11/2010   Years since quitting: 10.7  Smokeless Tobacco Never    Labs: Review Flowsheet  More data exists  Latest Ref Rng & Units 12/28/2018 11/16/2019 03/09/2020 10/24/2020  Labs for ITP Cardiac and Pulmonary Rehab  Cholestrol 0 - 200 mg/dL 205  136  161  -  LDL (calc) 0 - 99 mg/dL 124  59  72  -  HDL-C >39.00 mg/dL 63.30  57  65.90  -  Trlycerides 0.0 - 149.0 mg/dL 90.0  109  120.0  -  Hemoglobin A1c 4.6 - 6.5 % 5.6  - 5.8  6.0       07/23/2021  Labs for ITP Cardiac and Pulmonary Rehab  Cholestrol 123   LDL (calc) 50   HDL-C 49.40   Trlycerides 116.0   Hemoglobin A1c 5.6      Pulmonary Assessment Scores:  Pulmonary Assessment Scores     Row Name 05/16/21 1646         ADL UCSD   SOB Score total 68     Rest 2     Walk 2     Stairs 4     Bath 2     Dress 1     Shop 4       CAT Score   CAT Score 26       mMRC Score   mMRC Score 1              UCSD: Self-administered rating of  dyspnea associated with activities of daily living (ADLs) 6-point scale (0 = "not at all" to 5 = "maximal or unable to do because of breathlessness")  Scoring Scores range from 0 to 120.  Minimally important difference is 5 units  CAT: CAT can identify the health impairment of COPD patients and is better correlated with disease progression.  CAT has a scoring range of zero to 40. The CAT score is classified into four groups of low (less than 10), medium (10 - 20), high (21-30) and very high (31-40) based on the impact level of disease on health status. A CAT score over 10 suggests significant symptoms.  A worsening CAT score could be explained by an exacerbation, poor medication adherence, poor inhaler technique, or progression of COPD or comorbid conditions.  CAT MCID is 2 points  mMRC: mMRC (Modified Medical Research Council) Dyspnea Scale is used to assess the degree of baseline functional disability in patients of respiratory disease due to dyspnea. No minimal important difference is established. A decrease in score of 1 point or greater is considered a positive change.   Pulmonary Function Assessment:   Exercise Target Goals: Exercise Program Goal: Individual exercise prescription set using results from initial 6 min walk test and THRR while considering  patient's activity barriers and safety.   Exercise Prescription Goal: Initial exercise prescription builds to 30-45 minutes a day of aerobic activity, 2-3 days per week.  Home exercise guidelines will be given to patient during program as part of exercise prescription that the participant will acknowledge.  Education: Aerobic Exercise: - Group verbal and visual presentation on the components of exercise prescription. Introduces F.I.T.T principle from ACSM for exercise prescriptions.  Reviews F.I.T.T. principles of aerobic exercise including progression. Written material given at graduation. Flowsheet Row Pulmonary Rehab from 08/01/2021 in  [facility] Cardiac and Pulmonary Rehab  Date 06/27/21  Educator [facility]  Instruction Review Code 1- Verbalizes Understanding       Education: Resistance Exercise: - Group verbal and visual presentation on the components of exercise prescription. Introduces F.I.T.T principle from ACSM for exercise prescriptions  Reviews F.I.T.T. principles of resistance exercise including progression. Written  material given at graduation. Flowsheet Row Pulmonary Rehab from 08/01/2021 in [facility] Cardiac and Pulmonary Rehab  Date 07/04/21  Educator [facility]  Instruction Review Code 1- Verbalizes Understanding        Education: Exercise & Equipment Safety: - Individual verbal instruction and demonstration of equipment use and safety with use of the equipment. Flowsheet Row Pulmonary Rehab from 08/01/2021 in [facility] Cardiac and Pulmonary Rehab  Date 05/16/21  Educator [facility]  Instruction Review Code 1- Verbalizes Understanding       Education: Exercise Physiology & General Exercise Guidelines: - Group verbal and written instruction with models to review the exercise physiology of the cardiovascular system and associated critical values. Provides general exercise guidelines with specific guidelines to those with heart or lung disease.  Flowsheet Row Pulmonary Rehab from 08/01/2021 in [facility] Cardiac and Pulmonary Rehab  Date 06/20/21  Educator [facility]  Instruction Review Code 1- [employer] Understanding       Education: Flexibility, Balance, Mind/Body Relaxation: - Group verbal and visual presentation with interactive activity on the components of exercise prescription. Introduces F.I.T.T principle from ACSM for exercise prescriptions. Reviews F.I.T.T. principles of flexibility and balance exercise training including progression. Also discusses the mind body connection.  Reviews various relaxation techniques to help reduce and manage stress (i.e. Deep breathing, progressive muscle relaxation, and visualization). Balance handout provided to  take home. Written material given at graduation.   Activity Barriers & Risk Stratification:  Activity Barriers & Cardiac Risk Stratification - 05/08/21 1409       Activity Barriers & Cardiac Risk Stratification   Activity Barriers Joint Problems;Shortness of Breath;Deconditioning;Muscular Weakness;Back Problems             6 Minute Walk:  6 Minute Walk     Row Name 05/16/21 1638         6 Minute Walk   Phase Initial     Distance 1050 feet     Walk Time 6 minutes     # of Rest Breaks 0     MPH 1.99     METS 2     RPE 11     Perceived Dyspnea  3     VO2 Peak 7.2     Symptoms Yes (comment)     Comments SOB     Resting HR 85 bpm     Resting BP 110/58     Resting Oxygen Saturation  93 %     Exercise Oxygen Saturation  during 6 min walk 87 %     Max Ex. HR 92 bpm     Max Ex. BP 114/58     2 Minute Post BP 114/64       Interval HR   1 Minute HR 89     2 Minute HR 72     3 Minute HR 90     4 Minute HR 92     5 Minute HR 92     6 Minute HR 84     2 Minute Post HR 90     Interval Heart Rate? Yes       Interval Oxygen   Interval Oxygen? Yes     Baseline Oxygen Saturation % 93 %     1 Minute Oxygen Saturation % 90 %     1 Minute Liters of Oxygen 2.5 L     2 Minute Oxygen Saturation % 87 %     2 Minute Liters of Oxygen 2.5 L     3 Minute Oxygen Saturation %  87 %     3 Minute Liters of Oxygen 2.5 L     4 Minute Oxygen Saturation % 90 %     4 Minute Liters of Oxygen 2.5 L     5 Minute Oxygen Saturation % 89 %     5 Minute Liters of Oxygen 2.5 L     6 Minute Oxygen Saturation % 89 %     6 Minute Liters of Oxygen 2.5 L     2 Minute Post Oxygen Saturation % 95 %     2 Minute Post Liters of Oxygen 2.5 L             Oxygen Initial Assessment:  Oxygen Initial Assessment - 07/25/21 0942       Home Oxygen   Home Oxygen Device Home Concentrator;E-Tanks    Sleep Oxygen Prescription Continuous    Liters per minute 3    Home Exercise Oxygen Prescription  Continuous    Liters per minute 3    Home Resting Oxygen Prescription Continuous    Liters per minute 3    Compliance with Home Oxygen Use Yes      Program Oxygen Prescription   Program Oxygen Prescription Continuous    Liters per minute 3      Intervention   Short Term Goals To learn and exhibit compliance with exercise, home and travel O2 prescription;To learn and understand importance of monitoring SPO2 with pulse oximeter and demonstrate accurate use of the pulse oximeter.;To learn and demonstrate proper pursed lip breathing techniques or other breathing techniques. ;To learn and understand importance of maintaining oxygen saturations>88%    Long  Term Goals Verbalizes importance of monitoring SPO2 with pulse oximeter and return demonstration;Exhibits compliance with exercise, home  and travel O2 prescription;Maintenance of O2 saturations>88%;Exhibits proper breathing techniques, such as pursed lip breathing or other method taught during program session             Oxygen Re-Evaluation:  Oxygen Re-Evaluation     Row Name 05/21/21 0924 06/18/21 1350 07/25/21 0942         Program Oxygen Prescription   Program Oxygen Prescription Continuous Continuous --     Liters per minute -- 3 --       Home Oxygen   Home Oxygen Device Home Concentrator;E-Tanks Home Concentrator;E-Tanks --     Sleep Oxygen Prescription Continuous Continuous --     Liters per minute 2.5 2.5 --     Home Exercise Oxygen Prescription Continuous Continuous --     Liters per minute 2.5 2.5 --     Home Resting Oxygen Prescription Continuous Continuous --     Compliance with Home Oxygen Use Yes Yes --       Goals/Expected Outcomes   Short Term Goals To learn and exhibit compliance with exercise, home and travel O2 prescription;To learn and understand importance of monitoring SPO2 with pulse oximeter and demonstrate accurate use of the pulse oximeter.;To learn and demonstrate proper pursed lip breathing techniques  or other breathing techniques. ;To learn and understand importance of maintaining oxygen saturations>88% To learn and exhibit compliance with exercise, home and travel O2 prescription;To learn and understand importance of monitoring SPO2 with pulse oximeter and demonstrate accurate use of the pulse oximeter.;To learn and demonstrate proper pursed lip breathing techniques or other breathing techniques. ;To learn and understand importance of maintaining oxygen saturations>88% --     Long  Term Goals Verbalizes importance of monitoring SPO2 with pulse oximeter and return demonstration;Exhibits compliance with  exercise, home  and travel O2 prescription;Maintenance of O2 saturations>88%;Exhibits proper breathing techniques, such as pursed lip breathing or other method taught during program session Verbalizes importance of monitoring SPO2 with pulse oximeter and return demonstration;Exhibits compliance with exercise, home  and travel O2 prescription;Maintenance of O2 saturations>88%;Exhibits proper breathing techniques, such as pursed lip breathing or other method taught during program session --     Comments Reviewed PLB technique with pt.  Talked about how it works and it's importance in maintaining their exercise saturations. Brandy is staying compliant with her oxygen and still using PLB when needed. She hasn't seen too much of a difference with her SOB but feels like she can do more at home. She is compliant with checking her O2 saturations at home which are staying above 88%. Dawanda is doing well. She continues to watch her O2 and HR at home and reports normal values. She feels her breathing has improved, even though she was recently discharged from the hospital. She is excited to get back into the routine of acitivty at home.     Goals/Expected Outcomes Short: Become more profiecient at using PLB.   Long: Become independent at using PLB. Short: Continue checking O2 levels at home Long: Maintain use of PLB and  maintain oxygen greater than 88%. Short: Continue PLB when needed Long: Routine maintenance staying above 88% oxygen.              Oxygen Discharge (Final Oxygen Re-Evaluation):  Oxygen Re-Evaluation - 07/25/21 0942       Goals/Expected Outcomes   Comments Cammi is doing well. She continues to watch her O2 and HR at home and reports normal values. She feels her breathing has improved, even though she was recently discharged from the hospital. She is excited to get back into the routine of acitivty at home.    Goals/Expected Outcomes Short: Continue PLB when needed Long: Routine maintenance staying above 88% oxygen.             Initial Exercise Prescription:  Initial Exercise Prescription - 05/16/21 1600       Date of Initial Exercise RX and Referring Provider   Date 05/16/21    Referring Provider Patsey Berthold      Oxygen   Oxygen Continuous    Liters 3    Maintain Oxygen Saturation 88% or higher      Treadmill   MPH 1.5    Grade 0    Minutes 15    METs 2      Arm Ergometer   Level 1    RPM 25    Minutes 15    METs 2      Recumbant Elliptical   Level 1    RPM 50    Minutes 15    METs 2      REL-XR   Level 1    Speed 50    Minutes 15    METs 2      Track   Laps 20    Minutes 15    METs 2      Prescription Details   Frequency (times per week) 3    Duration Progress to 30 minutes of continuous aerobic without signs/symptoms of physical distress      Intensity   THRR 40-80% of Max Heartrate 110-135    Ratings of Perceived Exertion 11-15    Perceived Dyspnea 0-4      Resistance Training   Training Prescription Yes    Weight 3 lb  Reps 10-15             Perform Capillary Blood Glucose checks as needed.  Exercise Prescription Changes:   Exercise Prescription Changes     Row Name 05/16/21 1600 06/03/21 1400 06/18/21 0900 07/02/21 1400 07/15/21 1500     Response to Exercise   Blood Pressure (Admit) 110/58 116/60 102/62 134/70 110/60    Blood Pressure (Exercise) 114/58 138/70 124/60 -- --   Blood Pressure (Exit) 114/64 98/60 118/64 104/52 98/56   Heart Rate (Admit) 85 bpm 74 bpm 75 bpm 87 bpm 84 bpm   Heart Rate (Exercise) 92 bpm 78 bpm 85 bpm 103 bpm 79 bpm   Heart Rate (Exit) 90 bpm 66 bpm 88 bpm 95 bpm 75 bpm   Oxygen Saturation (Admit) 93 % 95 % 98 % 92 % 89 %   Oxygen Saturation (Exercise) 87 % 90 % 88 % 88 % 88 %   Oxygen Saturation (Exit) 95 % 94 % 91 % 94 % 90 %   Rating of Perceived Exertion (Exercise) 11 13 13 14 12    Perceived Dyspnea (Exercise) 3 1 2 2 1    Symptoms SOB -- SOB SOB SOB   Duration -- Continue with 30 min of aerobic exercise without signs/symptoms of physical distress. Continue with 30 min of aerobic exercise without signs/symptoms of physical distress. Continue with 30 min of aerobic exercise without signs/symptoms of physical distress. Continue with 30 min of aerobic exercise without signs/symptoms of physical distress.   Intensity -- THRR unchanged THRR unchanged THRR unchanged THRR unchanged     Progression   Progression -- Continue to progress workloads to maintain intensity without signs/symptoms of physical distress. Continue to progress workloads to maintain intensity without signs/symptoms of physical distress. Continue to progress workloads to maintain intensity without signs/symptoms of physical distress. Continue to progress workloads to maintain intensity without signs/symptoms of physical distress.   Average METs -- 3.15 2.69 3.03 2.11     Resistance Training   Training Prescription -- Yes Yes Yes Yes   Weight -- 3 lb 3 lb 3 lb 4 lb   Reps -- 10-15 10-15 10-15 10-15     Interval Training   Interval Training -- -- No No No     Oxygen   Oxygen -- Continuous Continuous Continuous Continuous   Liters -- 3 3 3 3      Treadmill   MPH -- -- 1.9 1.6 1.6   Grade -- -- 0 0 0   Minutes -- -- 15 15 15    METs -- -- 2.45 2.23 2.23     Recumbant Bike   Level -- -- 1 -- --   Watts -- --  16 -- --   Minutes -- -- 15 -- --   METs -- -- 2.59 -- --     NuStep   Level -- -- 1 2 2    Minutes -- -- 15 15 15    METs -- -- 3.1 3.42 2     REL-XR   Level -- 1 1 1  --   Minutes -- 15 15 15  --   METs -- 3.7 -- -- --     Track   Laps -- 30 -- 22 --   Minutes -- 15 -- 15 --   METs -- 2.63 -- 2.2 --     Home Exercise Plan   Plans to continue exercise at -- -- Home (comment)  walking and staff Youtube videos Home (comment)  walking and staff Youtube videos  Home (comment)  walking and staff Youtube videos   Frequency -- -- Add 2 additional days to program exercise sessions.  Start with 1 Add 2 additional days to program exercise sessions.  Start with 1 Add 2 additional days to program exercise sessions.  Start with 1   Initial Home Exercises Provided -- -- 06/18/21 06/18/21 06/18/21     Oxygen   Maintain Oxygen Saturation -- 88% or higher 88% or higher 88% or higher 88% or higher    Row Name 07/29/21 0900             Response to Exercise   Blood Pressure (Admit) 100/60       Blood Pressure (Exit) 98/60       Heart Rate (Admit) 62 bpm       Heart Rate (Exercise) 90 bpm       Heart Rate (Exit) 72 bpm       Oxygen Saturation (Admit) 91 %       Oxygen Saturation (Exercise) 88 %       Oxygen Saturation (Exit) 91 %       Rating of Perceived Exertion (Exercise) 13       Perceived Dyspnea (Exercise) 3       Symptoms SOB       Duration Continue with 30 min of aerobic exercise without signs/symptoms of physical distress.       Intensity THRR unchanged         Progression   Progression Continue to progress workloads to maintain intensity without signs/symptoms of physical distress.       Average METs 2.14         Resistance Training   Training Prescription Yes       Weight 3 lb       Reps 10-15         Interval Training   Interval Training Yes         Oxygen   Oxygen Continuous       Liters 3         Recumbant Bike   Level 2       Minutes 15       METs 2.89          NuStep   Level 3       Minutes 15       METs 2.2         Arm Ergometer   Level 1       Minutes 15       METs 1         REL-XR   Level 3       Minutes 30       METs 1.8         Home Exercise Plan   Plans to continue exercise at Home (comment)  walking and staff Youtube videos       Frequency Add 2 additional days to program exercise sessions.  Start with 1       Initial Home Exercises Provided 06/18/21         Oxygen   Maintain Oxygen Saturation 88% or higher                Exercise Comments:   Exercise Comments     Row Name 05/21/21 7510 06/18/21 0939         Exercise Comments First full day of exercise!  Patient was oriented to gym and equipment including functions, settings, policies, and procedures.  Patient's  individual exercise prescription and treatment plan were reviewed.  All starting workloads were established based on the results of the 6 minute walk test done at initial orientation visit.  The plan for exercise progression was also introduced and progression will be customized based on patient's performance and goals. Reviewed home exercise with pt today.  Pt plans to walk and do staff Youtube videos for exercise.  Reviewed THR, pulse, RPE, sign and symptoms, pulse oximetery and when to call 911 or MD.  Also discussed weather considerations and indoor options.  Pt voiced understanding.               Exercise Goals and Review:   Exercise Goals     Row Name 05/16/21 1645             Exercise Goals   Increase Physical Activity Yes       Intervention Provide advice, education, support and counseling about physical activity/exercise needs.;Develop an individualized exercise prescription for aerobic and resistive training based on initial evaluation findings, risk stratification, comorbidities and participant's personal goals.       Expected Outcomes Short Term: Attend rehab on a regular basis to increase amount of physical activity.;Long Term: Add in  home exercise to make exercise part of routine and to increase amount of physical activity.;Long Term: Exercising regularly at least 3-5 days a week.       Increase Strength and Stamina Yes       Intervention Provide advice, education, support and counseling about physical activity/exercise needs.;Develop an individualized exercise prescription for aerobic and resistive training based on initial evaluation findings, risk stratification, comorbidities and participant's personal goals.       Expected Outcomes Short Term: Increase workloads from initial exercise prescription for resistance, speed, and METs.;Short Term: Perform resistance training exercises routinely during rehab and add in resistance training at home;Long Term: Improve cardiorespiratory fitness, muscular endurance and strength as measured by increased METs and functional capacity (6MWT)       Able to understand and use rate of perceived exertion (RPE) scale Yes       Intervention Provide education and explanation on how to use RPE scale       Expected Outcomes Short Term: Able to use RPE daily in rehab to express subjective intensity level;Long Term:  Able to use RPE to guide intensity level when exercising independently       Able to understand and use Dyspnea scale Yes       Intervention Provide education and explanation on how to use Dyspnea scale       Expected Outcomes Short Term: Able to use Dyspnea scale daily in rehab to express subjective sense of shortness of breath during exertion;Long Term: Able to use Dyspnea scale to guide intensity level when exercising independently       Knowledge and understanding of Target Heart Rate Range (THRR) Yes       Intervention Provide education and explanation of THRR including how the numbers were predicted and where they are located for reference       Expected Outcomes Short Term: Able to state/look up THRR;Short Term: Able to use daily as guideline for intensity in rehab;Long Term: Able to  use THRR to govern intensity when exercising independently       Able to check pulse independently Yes       Intervention Provide education and demonstration on how to check pulse in carotid and radial arteries.;Review the importance of being able to check your own  pulse for safety during independent exercise       Expected Outcomes Short Term: Able to explain why pulse checking is important during independent exercise;Long Term: Able to check pulse independently and accurately       Understanding of Exercise Prescription Yes       Intervention Provide education, explanation, and written materials on patient's individual exercise prescription       Expected Outcomes Short Term: Able to explain program exercise prescription;Long Term: Able to explain home exercise prescription to exercise independently                Exercise Goals Re-Evaluation :  Exercise Goals Re-Evaluation     Row Name 05/21/21 9450 06/03/21 1419 06/18/21 1343 07/02/21 1506 07/02/21 1509     Exercise Goal Re-Evaluation   Exercise Goals Review Increase Physical Activity;Able to understand and use rate of perceived exertion (RPE) scale;Knowledge and understanding of Target Heart Rate Range (THRR);Understanding of Exercise Prescription;Increase Strength and Stamina;Able to check pulse independently;Able to understand and use Dyspnea scale Increase Physical Activity;Increase Strength and Stamina Increase Physical Activity;Increase Strength and Stamina;Understanding of Exercise Prescription Increase Physical Activity;Increase Strength and Stamina;Understanding of Exercise Prescription Increase Physical Activity;Increase Strength and Stamina;Understanding of Exercise Prescription   Comments Reviewed RPE and dyspnea scales, THR and program prescription with pt today.  Pt voiced understanding and was given a copy of goals to take home. Lillee is tolerating exercise well so far.  Oxygen has stayed in the 90s for most sessions.  She  has done 30 laps on the track and has increased to 1.7 mph on TM.  We will continue to monitor progress. Reviewed home exercise with pt today.  Pt plans to walk and do staff Youtube videos for exercise. She is walking about 1/2 mile at a time at home. She checks her oxygen but has not been checking her HR.  Reviewed THR, pulse, RPE, sign and symptoms, pulse oximetery and when to call 911 or MD.  Also discussed weather considerations and indoor options.  Pt voiced understanding. Anyae is doing well in rehab. She has increased her average METs to [facility] is doing well in rehab. She has increased her average METs to 3.03. She improved to level 2 on the T4NS machine. Patient would benefit from increasing resistance to 4 lbs. We will continue to moniter her progress in rehab.   Expected Outcomes Short: Use RPE daily to regulate intensity. Long: Follow program prescription in THR. Short:  maintain consistent attendance Long:  improve overall stamina Short: Start checking HR during exercise using pulse ox  Long: Continue independence exercise with appropriate prescription -- Short: Increase resistance to 4 lbs.. Long: Progress to hit THR during exercise.    [place] Name 07/15/21 1549 07/25/21 0955 07/29/21 0923         Exercise Goal Re-Evaluation   Exercise Goals Review Increase Physical Activity;Increase Strength and Stamina;Understanding of Exercise Prescription Increase Physical Activity;Increase Strength and Stamina;Understanding of Exercise Prescription Increase Physical Activity;Increase Strength and Stamina;Understanding of Exercise Prescription     Comments Graceanne has been out since 5/11 after a hospital admission.  She will seek clearance from her doctor prior to returning to the program. Lacresha was recently discharged from the hospital and has not done much exercise at home.  She is trying to get into the swing of things and is interested in joining the [facility]- a brochure was provided. She recently  exercised with [employer] videos at home but says they're too strenous for her.  We discussed using our staff Youtube videos and to do a light load to start since she hasn't been in a normal exercise regimen. Her goal would be able to exercise with those videos down the road. She is checking her HR and O2. Vicky is doing well in rehab. She is still getting back into the swing of things since being in the hospital. She did, however, increase to level 3 on both the T4 Nustep and XR. She has not been walking as much as staff will continue to encourage patient to try again. Will continue to monitor.     Expected Outcomes Short: Clearance to return to program Long: Continue to improve stamina Short: Start 1 day of light exercise at home  Long: Continue to exercise independently at home or at gym facility Short: Try walking again Long: Continue to increase overall MET level              Discharge Exercise Prescription (Final Exercise Prescription Changes):  Exercise Prescription Changes - 07/29/21 0900       Response to Exercise   Blood Pressure (Admit) 100/60    Blood Pressure (Exit) 98/60    Heart Rate (Admit) 62 bpm    Heart Rate (Exercise) 90 bpm    Heart Rate (Exit) 72 bpm    Oxygen Saturation (Admit) 91 %    Oxygen Saturation (Exercise) 88 %    Oxygen Saturation (Exit) 91 %    Rating of Perceived Exertion (Exercise) 13    Perceived Dyspnea (Exercise) 3    Symptoms SOB    Duration Continue with 30 min of aerobic exercise without signs/symptoms of physical distress.    Intensity THRR unchanged      Progression   Progression Continue to progress workloads to maintain intensity without signs/symptoms of physical distress.    Average METs 2.14      Resistance Training   Training Prescription Yes    Weight 3 lb    Reps 10-15      Interval Training   Interval Training Yes      Oxygen   Oxygen Continuous    Liters 3      Recumbant Bike   Level 2    Minutes 15    METs 2.89       NuStep   Level 3    Minutes 15    METs 2.2      Arm Ergometer   Level 1    Minutes 15    METs 1      REL-XR   Level 3    Minutes 30    METs 1.8      Home Exercise Plan   Plans to continue exercise at Home (comment)   walking and staff Youtube videos   Frequency Add 2 additional days to program exercise sessions.   Start with 1   Initial Home Exercises Provided 06/18/21      Oxygen   Maintain Oxygen Saturation 88% or higher             Nutrition:  Target Goals: Understanding of nutrition guidelines, daily intake of sodium '1500mg'$ , cholesterol '200mg'$ , calories 30% from fat and 7% or less from saturated fats, daily to have 5 or more servings of fruits and vegetables.  Education: All About Nutrition: -Group instruction provided by verbal, written material, interactive activities, discussions, models, and posters to present general guidelines for heart healthy nutrition including fat, fiber, MyPlate, the role of sodium in heart healthy nutrition, utilization of the  nutrition label, and utilization of this knowledge for meal planning. Follow up email sent as well. Written material given at graduation. Flowsheet Row Pulmonary Rehab from 08/01/2021 in [facility] Cardiac and Pulmonary Rehab  Date 07/18/21  Educator [facility]  Instruction Review Code 1- Verbalizes Understanding       Biometrics:  Pre Biometrics - 05/16/21 1646       Pre Biometrics   Height 5' 8.75" (1.746 m)    Weight 199 lb 11.2 oz (90.6 kg)    BMI (Calculated) 29.71    Single Leg Stand 4.55 seconds              Nutrition Therapy Plan and Nutrition Goals:  Nutrition Therapy & Goals - 05/28/21 1133       Nutrition Therapy   Diet Heart healthy, low Na, pulmonary MNT    Drug/Food Interactions Statins/Certain Fruits    Protein (specify units) 100g   1.2xkg (COPD)   Fiber 25 grams    Whole Grain Foods 3 servings    Saturated Fats 16 max. grams    Fruits and Vegetables 8 servings/day    Sodium 2 grams       Personal Nutrition Goals   Nutrition Goal ST: try some suggestions (Suggested cutting up fruits and vegetables while sitting down, roasting vegetables and chicken, using cut up vegetables to have salads more often, utilizing microwave ready grains, and replacing high caloire dips with low fat greek yogurt and hummus) in addiiton to trying out pre-made meals. Honor hunger.  LT: limit saturated fat < 16g/day, limit sodium <2g/day, eat 8 fruit/vegetables per day, make heart healthy changes that work for her    Comments 71 y.o. F admitted to pulmonary rehab for shortness of breath. PMHx includes CAD, HFpEF, COPD, GERD, osteoporosis, former smoker. Relevant medications includes lipitor, lasix, prozac, CA w/ vit D, omeprazole. PYP Score: 53. Vegetables & Fruits 6/12. Breads, Grains & Cereals 9/12. Red & Processed Meat 8/12. Poultry 022. Fish & Shellfish 0/4. Beans, Nuts & Seeds 3/4. Milk & Dairy Foods 2/6. Toppings, Oils, Seasonings & Salt 6/20. Sweets, Snacks & Restaurant Food 9/14. Beverages 8/10.  B: [employer] (eggs with bacon - sometimes no bacon) and fruit L: Sandwiches (ham and cheese with whole grain bread), pretzels with 2 tbsp of pimento cheese, or salad. S: chicken salad and dips with chips. She has done weight watchers in the past and maintained weight for 3 years, since reduing activity from shortness of breath she has gained weight. Discussed honoring hunger and limiting excess calories. Discussed heart healthy eating and pulmonary MNT - will need to review again as time was limited. She would like to get meals she could put together like [employer] nutrition - advised avoiding calorie smart as they can be ~350kcal and keto as that would limit CHO and could also be lower in calories. Discussed some limitations with pre-made meals like less control, higher salt, and less whole grains. Suggested cutting up fruits and vegetables while sitting down, roasting vegetables and chicken, using cut up vegetables to  have salads more often, utilizing microwave ready grains, and replacing high caloire dips with low fat greek yogurt and hummus.      Intervention Plan   Intervention Prescribe, educate and counsel regarding individualized specific dietary modifications aiming towards targeted core components such as weight, hypertension, lipid management, diabetes, heart failure and other comorbidities.;Nutrition handout(s) given to patient.    Expected Outcomes Short Term Goal: Understand basic principles of dietary content, such as calories, fat, sodium,  cholesterol and nutrients.;Short Term Goal: A plan has been developed with personal nutrition goals set during dietitian appointment.;Long Term Goal: Adherence to prescribed nutrition plan.             Nutrition Assessments:  MEDIFICTS Score Key: ?70 Need to make dietary changes  40-70 Heart Healthy Diet ? 40 Therapeutic Level Cholesterol Diet  Flowsheet Row Pulmonary Rehab from 05/16/2021 in [facility] Cardiac and Pulmonary Rehab  Picture Your Plate Total Score on Admission 53      Picture Your Plate Scores: <08 Unhealthy dietary pattern with much room for improvement. 41-50 Dietary pattern unlikely to meet recommendations for good health and room for improvement. 51-60 More healthful dietary pattern, with some room for improvement.  >60 Healthy dietary pattern, although there may be some specific behaviors that could be improved.   Nutrition Goals Re-Evaluation:  Nutrition Goals Re-Evaluation     Row Name 06/18/21 0957 07/25/21 0948           Goals   Nutrition Goal ST: try some suggestions (Suggested cutting up fruits and vegetables while sitting down, roasting vegetables and chicken, using cut up vegetables to have salads more often, utilizing microwave ready grains, and replacing high caloire dips with low fat greek yogurt and hummus) in addiiton to trying out pre-made meals. Honor hunger.  LT: limit saturated fat < 16g/day, limit sodium  <2g/day, eat 8 fruit/vegetables per day, make heart healthy changes that work for her ST: try some suggestions (Suggested cutting up fruits and vegetables while sitting down, roasting vegetables and chicken, using cut up vegetables to have salads more often, utilizing microwave ready grains, and replacing high caloire dips with low fat greek yogurt and hummus) in addiiton to trying out pre-made meals. Honor hunger.  LT: limit saturated fat < 16g/day, limit sodium <2g/day, eat 8 fruit/vegetables per day, make heart healthy changes that work for her      Comment Kevia has changed on her portion sizes by limiting their size during her meals. She is trying out new frozen dinners and made her aware to be conscious of the sodium on the label. We talked about looking at [place] (consulted with the RD who is OK with recommendation, just noted to be mindful of how many carbs are in the meal if they are low, to pair her meal with an additional carb to meet her caloric needs). She has eliminated dips from her diet but has not tried any other alternatives. She admits it's hard for her to cut down on a couple glasses of wine which is one of her goals she's trying to meet. She is going to start measuring her ounces poured and keep track by slowly reducing her intake. She hasn't tried working on any other goals at this time and declined other questions. Bentli admits she is not cooking as much as she used to. We talked about meal prep and pre-cutting vegetables. She tends to snack and eat smaller meals. She is limiting her portion size. She also cut down her wine intake from every night to only 3 times/ week which is a big improvement for her. She states she hasn't lost weight but feel like her clothes fit different.      Expected Outcome Short: Continue to follow goals established by RD Long: Continue to focus on a friendly pulmonary-based diet Short: Continue to follow goals established by RD Long:  Continue to focus on a friendly pulmonary-based diet  Nutrition Goals Discharge (Final Nutrition Goals Re-Evaluation):  Nutrition Goals Re-Evaluation - 07/25/21 0948       Goals   Nutrition Goal ST: try some suggestions (Suggested cutting up fruits and vegetables while sitting down, roasting vegetables and chicken, using cut up vegetables to have salads more often, utilizing microwave ready grains, and replacing high caloire dips with low fat greek yogurt and hummus) in addiiton to trying out pre-made meals. Honor hunger.  LT: limit saturated fat < 16g/day, limit sodium <2g/day, eat 8 fruit/vegetables per day, make heart healthy changes that work for her    Comment Lateasha admits she is not cooking as much as she used to. We talked about meal prep and pre-cutting vegetables. She tends to snack and eat smaller meals. She is limiting her portion size. She also cut down her wine intake from every night to only 3 times/ week which is a big improvement for her. She states she hasn't lost weight but feel like her clothes fit different.    Expected Outcome Short: Continue to follow goals established by RD Long: Continue to focus on a friendly pulmonary-based diet             Psychosocial: Target Goals: Acknowledge presence or absence of significant depression and/or stress, maximize coping skills, provide positive support system. Participant is able to verbalize types and ability to use techniques and skills needed for reducing stress and depression.   Education: Stress, Anxiety, and Depression - Group verbal and visual presentation to define topics covered.  Reviews how body is impacted by stress, anxiety, and depression.  Also discusses healthy ways to reduce stress and to treat/manage anxiety and depression.  Written material given at graduation. Flowsheet Row Pulmonary Rehab from 08/01/2021 in [facility] Cardiac and Pulmonary Rehab  Date 06/13/21  Educator [facility]  Instruction Review Code  1- [employer] Understanding       Education: Sleep Hygiene -Provides group verbal and written instruction about how sleep can affect your health.  Define sleep hygiene, discuss sleep cycles and impact of sleep habits. Review good sleep hygiene tips.    Initial Review & Psychosocial Screening:  Initial Psych Review & Screening - 05/08/21 1413       Initial Review   Source of Stress Concerns Unable to participate in former interests or hobbies;Unable to perform yard/household activities      [place]? Yes   family, church     Barriers   Psychosocial barriers to participate in program There are no identifiable barriers or psychosocial needs.;The patient should benefit from training in stress management and relaxation.      Screening Interventions   Interventions Encouraged to exercise;To provide support and resources with identified psychosocial needs    Expected Outcomes Short Term goal: Utilizing psychosocial counselor, staff and physician to assist with identification of specific Stressors or current issues interfering with healing process. Setting desired goal for each stressor or current issue identified.;Long Term Goal: Stressors or current issues are controlled or eliminated.;Short Term goal: Identification and review with participant of any Quality of Life or Depression concerns found by scoring the questionnaire.;Long Term goal: The participant improves quality of Life and PHQ9 Scores as seen by post scores and/or verbalization of changes             Quality of Life Scores:  Scores of 19 and below usually indicate a poorer quality of life in these areas.  A difference of  2-3 points is a clinically meaningful  difference.  A difference of 2-3 points in the total score of the Quality of Life Index has been associated with significant improvement in overall quality of life, self-image, physical symptoms, and general health in studies assessing change  in quality of life.  PHQ-9: Review Flowsheet  More data exists      07/29/2021 05/16/2021 03/11/2021 03/09/2020 12/28/2018  Depression screen PHQ 2/9  Decreased Interest 0 1 0 0 0  Down, Depressed, Hopeless 0 1 0 0 0  PHQ - 2 Score 0 2 0 0 0  Altered sleeping - 0 - 0 0  Tired, decreased energy - 3 - 0 0  Change in appetite - 3 - 0 0  Feeling bad or failure about yourself  - 1 - 0 0  Trouble concentrating - 0 - 0 0  Moving slowly or fidgety/restless - 2 - 0 0  Suicidal thoughts - 0 - 0 0  PHQ-9 Score - 11 - 0 0  Difficult doing work/chores - Somewhat difficult - Not difficult at all Not difficult at all   Interpretation of Total Score  Total Score Depression Severity:  1-4 = Minimal depression, 5-9 = Mild depression, 10-14 = Moderate depression, 15-19 = Moderately severe depression, 20-27 = Severe depression   Psychosocial Evaluation and Intervention:  Psychosocial Evaluation - 05/08/21 1418       Psychosocial Evaluation & Interventions   Interventions Encouraged to exercise with the program and follow exercise prescription    Comments Tyjae is coming to pulmonary rehab after worsening shortness of breath. She has a pacemaker with associated heart history, back issues, orthostatic hypotension history, and other medical issues. She used to be quite active but her breathing has prevented her from continuing to do so. She really wants to get back into exercising and hopefully lose some weight that she has gained back recenlty. She has a great support system and is very active in her granddaughters' lives after her daughter passed away from cancer 5 years ago. She helps with getting them to school/sports and being supportive which is a huge motivator for her to work on her health    Expected Outcomes Short: attend pulmonary rehab for education and exercise. Long: develop and maintain positive self care habits.    Continue Psychosocial Services  Follow up required by staff              Psychosocial Re-Evaluation:  Psychosocial Re-Evaluation     [place] Name 06/18/21 1002 07/25/21 0951           Psychosocial Re-Evaluation   Current issues with History of Depression History of Depression      Comments Acadia is doing well overall mentally. Her daughter passed away 5 years ago and has been taking medications for her depression since and feels like she is managing it well. She admits she has harder days emotionally accepting that she is gone. She takes Ambien to help her sleep. She is not interested in pursuing anything else at this time. Husband is good support and she really enjoys hanging out with her grandchildren.  She is enjoying the program and feels stronger. Chantale is doing well mentally. Her daughter's passing is always a barrier for her at times but learns to manage with good and bad days. She is heading to the beach for 3 weeks in a couple of weeks with her familywhich she is excited about. Her birthday is tomorrow and she has been celebrating all week. She tries to keep herself occupied with  family and friends. She is truly enjoying the program and feels it is helping with her breathing.      Expected Outcomes Short: Continue routine attendance with rehab Long: Continue to maintain positive attitude Short: Enjoy beach time with family Long: Continue to utilize exercise for stress management      Interventions Encouraged to attend Pulmonary Rehabilitation for the exercise Encouraged to attend Pulmonary Rehabilitation for the exercise      Continue Psychosocial Services  Follow up required by staff Follow up required by staff               Psychosocial Discharge (Final Psychosocial Re-Evaluation):  Psychosocial Re-Evaluation - 07/25/21 0951       Psychosocial Re-Evaluation   Current issues with History of Depression    Comments Salimata is doing well mentally. Her daughter's passing is always a barrier for her at times but learns to manage with good and bad  days. She is heading to the beach for 3 weeks in a couple of weeks with her familywhich she is excited about. Her birthday is tomorrow and she has been celebrating all week. She tries to keep herself occupied with family and friends. She is truly enjoying the program and feels it is helping with her breathing.    Expected Outcomes Short: Enjoy beach time with family Long: Continue to utilize exercise for stress management    Interventions Encouraged to attend Pulmonary Rehabilitation for the exercise    Continue Psychosocial Services  Follow up required by staff             Education: Education Goals: Education classes will be provided on a weekly basis, covering required topics. Participant will state understanding/return demonstration of topics presented.  Learning Barriers/Preferences:  Learning Barriers/Preferences - 05/08/21 1421       Learning Barriers/Preferences   Learning Barriers None    Learning Preferences None             General Pulmonary Education Topics:  Infection Prevention: - Provides verbal and written material to individual with discussion of infection control including proper hand washing and proper equipment cleaning during exercise session. Flowsheet Row Pulmonary Rehab from 08/01/2021 in [facility] Cardiac and Pulmonary Rehab  Date 05/16/21  Educator AS  Instruction Review Code 1- Verbalizes Understanding       Falls Prevention: - Provides verbal and written material to individual with discussion of falls prevention and safety. Flowsheet Row Pulmonary Rehab from 08/01/2021 in [facility] Cardiac and Pulmonary Rehab  Date 05/16/21  Educator AS  Instruction Review Code 1- Verbalizes Understanding       Chronic Lung Disease Review: - Group verbal instruction with posters, models, PowerPoint presentations and videos,  to review new updates, new respiratory medications, new advancements in procedures and treatments. Providing information on websites and "800"  numbers for continued self-education. Includes information about supplement oxygen, available portable oxygen systems, continuous and intermittent flow rates, oxygen safety, concentrators, and Medicare reimbursement for oxygen. Explanation of Pulmonary Drugs, including class, frequency, complications, importance of spacers, rinsing mouth after steroid MDI's, and proper cleaning methods for nebulizers. Review of basic lung anatomy and physiology related to function, structure, and complications of lung disease. Review of risk factors. Discussion about methods for diagnosing sleep apnea and types of masks and machines for OSA. Includes a review of the use of types of environmental controls: home humidity, furnaces, filters, dust mite/pet prevention, HEPA vacuums. Discussion about weather changes, air quality and the benefits of nasal washing. Instruction on Warning  signs, infection symptoms, calling MD promptly, preventive modes, and value of vaccinations. Review of effective airway clearance, coughing and/or vibration techniques. Emphasizing that all should Create an Action Plan. Written material given at graduation. Flowsheet Row Pulmonary Rehab from 08/01/2021 in [facility] Cardiac and Pulmonary Rehab  Education need identified 05/16/21  Date 06/06/21  Educator [facility]  Instruction Review Code 1- Verbalizes Understanding       AED/CPR: - Group verbal and written instruction with the use of models to demonstrate the basic use of the AED with the basic ABC's of resuscitation.    Anatomy and Cardiac Procedures: - Group verbal and visual presentation and models provide information about basic cardiac anatomy and function. Reviews the testing methods done to diagnose heart disease and the outcomes of the test results. Describes the treatment choices: Medical Management, Angioplasty, or Coronary Bypass Surgery for treating various heart conditions including Myocardial Infarction, Angina, Valve Disease, and Cardiac  Arrhythmias.  Written material given at graduation. Flowsheet Row Pulmonary Rehab from 08/01/2021 in [facility] Cardiac and Pulmonary Rehab  Date 07/04/21  [facility]  Instruction Review Code 1- Verbalizes Understanding       Medication Safety: - Group verbal and visual instruction to review commonly prescribed medications for heart and lung disease. Reviews the medication, class of the drug, and side effects. Includes the steps to properly store meds and maintain the prescription regimen.  Written material given at graduation. Flowsheet Row Pulmonary Rehab from 08/01/2021 in [facility] Cardiac and Pulmonary Rehab  Date 05/23/21  Educator SB  Instruction Review Code 1- Verbalizes Understanding       Other: -Provides group and verbal instruction on various topics (see comments)   Knowledge Questionnaire Score:  Knowledge Questionnaire Score - 05/16/21 1648       Knowledge Questionnaire Score   Pre Score 14/18              Core Components/Risk Factors/Patient Goals at Admission:  Personal Goals and Risk Factors at Admission - 05/16/21 1649       Core Components/Risk Factors/Patient Goals on Admission    Weight Management Yes;Weight Loss    Intervention Weight Management: Develop a combined nutrition and exercise program designed to reach desired caloric intake, while maintaining appropriate intake of nutrient and fiber, sodium and fats, and appropriate energy expenditure required for the weight goal.;Weight Management: Provide education and appropriate resources to help participant work on and attain dietary goals.;Weight Management/Obesity: Establish reasonable short term and long term weight goals.    Expected Outcomes Short Term: Continue to assess and modify interventions until short term weight is achieved;Long Term: Adherence to nutrition and physical activity/exercise program aimed toward attainment of established weight goal;Weight Loss: Understanding of general recommendations for  a balanced deficit meal plan, which promotes 1-2 lb weight loss per week and includes a negative energy balance of [phone] kcal/d;Understanding recommendations for meals to include 15-35% energy as protein, 25-35% energy from fat, 35-60% energy from carbohydrates, less than $RemoveB'200mg'TudFWahu$  of dietary cholesterol, 20-35 gm of total fiber daily;Understanding of distribution of calorie intake throughout the day with the consumption of 4-5 meals/snacks    Improve shortness of breath with ADL's Yes    Intervention Provide education, individualized exercise plan and daily activity instruction to help decrease symptoms of SOB with activities of daily living.    Expected Outcomes Short Term: Improve cardiorespiratory fitness to achieve a reduction of symptoms when performing ADLs;Long Term: Be able to perform more ADLs without symptoms or delay the onset of symptoms  Increase knowledge of respiratory medications and ability to use respiratory devices properly  Yes    Intervention Provide education and demonstration as needed of appropriate use of medications, inhalers, and oxygen therapy.    Expected Outcomes Short Term: Achieves understanding of medications use. Understands that oxygen is a medication prescribed by physician. Demonstrates appropriate use of inhaler and oxygen therapy.;Long Term: Maintain appropriate use of medications, inhalers, and oxygen therapy.    Lipids Yes    Intervention Provide education and support for participant on nutrition & aerobic/resistive exercise along with prescribed medications to achieve LDL '70mg'$ , HDL >$Remo'40mg'THZVL$ .    Expected Outcomes Short Term: Participant states understanding of desired cholesterol values and is compliant with medications prescribed. Participant is following exercise prescription and nutrition guidelines.;Long Term: Cholesterol controlled with medications as prescribed, with individualized exercise RX and with personalized nutrition plan. Value goals: LDL < $Rem'70mg'cVps$ , HDL >  40 mg.             Education:Diabetes - Individual verbal and written instruction to review signs/symptoms of diabetes, desired ranges of glucose level fasting, after meals and with exercise. Acknowledge that pre and post exercise glucose checks will be done for 3 sessions at entry of program.   Know Your Numbers and Heart Failure: - Group verbal and visual instruction to discuss disease risk factors for cardiac and pulmonary disease and treatment options.  Reviews associated critical values for Overweight/Obesity, Hypertension, Cholesterol, and Diabetes.  Discusses basics of heart failure: signs/symptoms and treatments.  Introduces Heart Failure Zone chart for action plan for heart failure.  Written material given at graduation. Flowsheet Row Pulmonary Rehab from 08/01/2021 in [facility] Cardiac and Pulmonary Rehab  Date 05/30/21  Educator SB  Instruction Review Code 1- Verbalizes Understanding       Core Components/Risk Factors/Patient Goals Review:   Goals and Risk Factor Review     Row Name 06/18/21 1004 07/25/21 0943           Core Components/Risk Factors/Patient Goals Review   Personal Goals Review Weight Management/Obesity;Improve shortness of breath with ADL's;Lipids Weight Management/Obesity;Improve shortness of breath with ADL's;Lipids      Review Jaylynn has been doing well overall- she feels stronger and feels she has more stamina but does not feel like her SOB has improved all that much.The pollen right now has been affecting her breathing when going outside and is trying to limit that as a trigger. She is motivated to stay active. She is staying compliant with taking all of her medications, including her respiratory ones. She has been wanting to lose weight but said her weight has been staying consistent. We talked about also focusing on how she feels & how her clothes fit as she could also be adding muscle mass. She is in the process of making dietary changes and limiting her  wine intake. Not sure when her next lipid panel is due. Dashley is doing well. She feels her breathing  and strength has improved overall. Specifically, she states she can get out of the chair a lot easier than she used to. She feels her activites at home are getting easier, except vacuuming has always been a struggle. She is checking her weight consistently. Her doctor had switched her diuretic to prn due to low BP. She is not sure when to take it and will ask her doctor to clarify when to take it. She is aware to report any abnormal weight sudden weight gain. She feels her clothes fit her better overall. She is  monitoring BP at home as it runs low sometimes. She keeps a log.      Expected Outcomes Short: Keep focusing on weight loss Long: Continue to manage lifestyle risk factors Short: Ask doctor about usage of diuretic Long: Continue to manage risk factors               Core Components/Risk Factors/Patient Goals at Discharge (Final Review):   Goals and Risk Factor Review - 07/25/21 0943       Core Components/Risk Factors/Patient Goals Review   Personal Goals Review Weight Management/Obesity;Improve shortness of breath with ADL's;Lipids    Review Deloma is doing well. She feels her breathing  and strength has improved overall. Specifically, she states she can get out of the chair a lot easier than she used to. She feels her activites at home are getting easier, except vacuuming has always been a struggle. She is checking her weight consistently. Her doctor had switched her diuretic to prn due to low BP. She is not sure when to take it and will ask her doctor to clarify when to take it. She is aware to report any abnormal weight sudden weight gain. She feels her clothes fit her better overall. She is monitoring BP at home as it runs low sometimes. She keeps a log.    Expected Outcomes Short: Ask doctor about usage of diuretic Long: Continue to manage risk factors             ITP  Comments:  ITP Comments     Row Name 05/08/21 1422 05/16/21 1651 05/21/21 0923 05/28/21 1130 06/12/21 1318   ITP Comments Initial telephone orientation completed. Diagnosis can be found in [facility]. EP orientation scheduled for Thursday 3/23 at 3pm. Completed 6MWT and gym orientation. Initial ITP created and sent for review to Dr. Ottie Glazier, Medical Director. First full day of exercise!  Patient was oriented to gym and equipment including functions, settings, policies, and procedures.  Patient's individual exercise prescription and treatment plan were reviewed.  All starting workloads were established based on the results of the 6 minute walk test done at initial orientation visit.  The plan for exercise progression was also introduced and progression will be customized based on patient's performance and goals. Completed initial RD consultation 30 Day review completed. Medical Director ITP review done, changes made as directed, and signed approval by Medical Director.    [place] Name 07/09/21 1539 07/10/21 0833 07/15/21 1548 08/07/21 1152     ITP Comments Patient called to let us know she will not be at rehab this week as she was in the ED yesterday for a UTI and dehydration. She will call us next week to let us know how she is feeling. 30 Day review completed. Medical Director ITP review done, changes made as directed, and signed approval by Medical Director. Pt called today to update status.  She had an echo tomorrow and see her PCP on Wednesday.  She still was not feeling 100% but will seek release to return to rehab from her PCP. 30 Day review completed. Medical Director ITP review done, changes made as directed, and signed approval by Medical Director.             Comments:

## 2021-08-16 ENCOUNTER — Other Ambulatory Visit: Payer: Self-pay | Admitting: Family Medicine

## 2021-08-16 ENCOUNTER — Other Ambulatory Visit: Payer: Self-pay | Admitting: Internal Medicine

## 2021-08-20 ENCOUNTER — Telehealth: Payer: Self-pay | Admitting: Family Medicine

## 2021-08-20 DIAGNOSIS — R0602 Shortness of breath: Secondary | ICD-10-CM

## 2021-08-20 DIAGNOSIS — N179 Acute kidney failure, unspecified: Secondary | ICD-10-CM

## 2021-08-20 DIAGNOSIS — I959 Hypotension, unspecified: Secondary | ICD-10-CM

## 2021-08-20 DIAGNOSIS — E559 Vitamin D deficiency, unspecified: Secondary | ICD-10-CM

## 2021-08-21 DIAGNOSIS — R0602 Shortness of breath: Secondary | ICD-10-CM

## 2021-08-21 NOTE — Progress Notes (Signed)
Daily Session Note  Patient Details  Name: Diana Cook MRN: [id number] Date of Birth: 03/10/1950 Referring Provider:   Flowsheet Row Pulmonary Rehab from 05/16/2021 in [facility] Cardiac and Pulmonary Rehab  Referring Provider Gonzalez       Encounter Date: 08/21/2021  Check In:  Session Check In - 08/21/21 0909       Check-In   Supervising physician immediately available to respond to emergencies See telemetry face sheet for immediately available ER MD    Location [facility]-Cardiac & Pulmonary Rehab    Staff Present Kelly Bollinger, MPA, RN;Laureen Brown, BS, RRT, CPFT;Kelly Hayes, BS, ACSM CEP, Exercise Physiologist;Joseph Hood, RCP,RRT,BSRT    Virtual Visit No    Medication changes reported     No    Fall or balance concerns reported    No    Tobacco Cessation No Change    Warm-up and Cool-down Performed on first and last piece of equipment    Resistance Training Performed Yes    VAD Patient? No    PAD/SET Patient? No      Pain Assessment   Currently in Pain? No/denies                Social History   Tobacco Use  Smoking Status Former   Packs/day: 1.50   Years: 42.00   Total pack years: 63.00   Types: Cigarettes   Quit date: 11/11/2010   Years since quitting: 10.7  Smokeless Tobacco Never    Goals Met:  Independence with exercise equipment Exercise tolerated well No report of concerns or symptoms today Strength training completed today  Goals Unmet:  Not Applicable  Comments: Pt able to follow exercise prescription today without complaint.  Will continue to monitor for progression.    Dr. Mark Miller is Medical Director for [facility].  Dr. Fuad Aleskerov is Medical Director for [facility]. 

## 2021-08-22 ENCOUNTER — Other Ambulatory Visit (INDEPENDENT_AMBULATORY_CARE_PROVIDER_SITE_OTHER): Payer: Medicare Other

## 2021-08-22 DIAGNOSIS — E559 Vitamin D deficiency, unspecified: Secondary | ICD-10-CM | POA: Diagnosis not present

## 2021-08-22 DIAGNOSIS — R0602 Shortness of breath: Secondary | ICD-10-CM

## 2021-08-22 DIAGNOSIS — N179 Acute kidney failure, unspecified: Secondary | ICD-10-CM | POA: Diagnosis not present

## 2021-08-22 LAB — VITAMIN D 25 HYDROXY (VIT D DEFICIENCY, FRACTURES): VITD: 41.41 ng/mL (ref 30.00–100.00)

## 2021-08-22 LAB — BASIC METABOLIC PANEL
BUN: 22 mg/dL (ref 6–23)
CO2: 18 mEq/L — ABNORMAL LOW (ref 19–32)
Calcium: 9.5 mg/dL (ref 8.4–10.5)
Chloride: 103 mEq/L (ref 96–112)
Creatinine, Ser: 1.08 mg/dL (ref 0.40–1.20)
GFR: 51.11 mL/min — ABNORMAL LOW (ref >60.00)
Glucose, Bld: 108 mg/dL — ABNORMAL HIGH (ref 70–99)
Potassium: 4.5 mEq/L (ref 3.5–5.1)
Sodium: 131 mEq/L — ABNORMAL LOW (ref 135–145)

## 2021-08-22 NOTE — Progress Notes (Signed)
Daily Session Note  Patient Details  Name: Diana Cook MRN: [id number] Date of Birth: [birth year] Referring Provider:   Flowsheet Row Pulmonary Rehab from 05/16/2021 in [facility] Cardiac and Pulmonary Rehab  Referring Provider Patsey Berthold       Encounter Date: 08/22/2021  Check In:  Session Check In - 08/22/21 0913       Check-In   Supervising physician immediately available to respond to emergencies See telemetry face sheet for immediately available ER MD    Location [facility]-Cardiac & Pulmonary Rehab    Staff Present Justin Mend, RCP,RRT,BSRT;Toussaint Golson, MPA, RN;Melissa [place], RDN, LDN;Jessica Henry, MA, RCEP, CCRP, CCET    Virtual Visit No    Medication changes reported     No    Fall or balance concerns reported    No    Tobacco Cessation No Change    Warm-up and Cool-down Performed on first and last piece of equipment    Resistance Training Performed Yes    VAD Patient? No    PAD/SET Patient? No      Pain Assessment   Currently in Pain? No/denies                Social History   Tobacco Use  Smoking Status Former   Packs/day: 1.50   Years: 42.00   Total pack years: 63.00   Types: Cigarettes   Quit date: 11/11/2010   Years since quitting: 10.7  Smokeless Tobacco Never    Goals Met:  Independence with exercise equipment Exercise tolerated well No report of concerns or symptoms today Strength training completed today  Goals Unmet:  Not Applicable  Comments: Pt able to follow exercise prescription today without complaint.  Will continue to monitor for progression.    Dr. Emily Filbert is Medical Director for [place].  Dr. Ottie Glazier is Medical Director for [facility] Pulmonary Rehabilitation.

## 2021-09-02 ENCOUNTER — Ambulatory Visit
Admission: RE | Admit: 2021-09-02 | Discharge: 2021-09-02 | Disposition: A | Discharge status: Home / Self Care | Payer: Medicare Other | Source: Ambulatory Visit | Attending: Pulmonary Disease | Admitting: Pulmonary Disease

## 2021-09-02 DIAGNOSIS — R911 Solitary pulmonary nodule: Secondary | ICD-10-CM | POA: Insufficient documentation

## 2021-09-02 DIAGNOSIS — Z950 Presence of cardiac pacemaker: Secondary | ICD-10-CM | POA: Diagnosis not present

## 2021-09-02 DIAGNOSIS — Z853 Personal history of malignant neoplasm of breast: Secondary | ICD-10-CM | POA: Diagnosis not present

## 2021-09-02 DIAGNOSIS — N3000 Acute cystitis without hematuria: Secondary | ICD-10-CM | POA: Diagnosis not present

## 2021-09-02 DIAGNOSIS — Z87891 Personal history of nicotine dependence: Secondary | ICD-10-CM | POA: Diagnosis not present

## 2021-09-02 DIAGNOSIS — A4150 Gram-negative sepsis, unspecified: Secondary | ICD-10-CM | POA: Diagnosis not present

## 2021-09-02 DIAGNOSIS — K219 Gastro-esophageal reflux disease without esophagitis: Secondary | ICD-10-CM | POA: Diagnosis not present

## 2021-09-02 DIAGNOSIS — I252 Old myocardial infarction: Secondary | ICD-10-CM | POA: Diagnosis not present

## 2021-09-02 DIAGNOSIS — I774 Celiac artery compression syndrome: Secondary | ICD-10-CM | POA: Diagnosis not present

## 2021-09-02 DIAGNOSIS — J432 Centrilobular emphysema: Secondary | ICD-10-CM | POA: Diagnosis not present

## 2021-09-02 DIAGNOSIS — Z90710 Acquired absence of both cervix and uterus: Secondary | ICD-10-CM | POA: Diagnosis not present

## 2021-09-02 DIAGNOSIS — R0789 Other chest pain: Secondary | ICD-10-CM | POA: Diagnosis not present

## 2021-09-02 DIAGNOSIS — I480 Paroxysmal atrial fibrillation: Secondary | ICD-10-CM | POA: Diagnosis not present

## 2021-09-02 DIAGNOSIS — Z7901 Long term (current) use of anticoagulants: Secondary | ICD-10-CM | POA: Diagnosis not present

## 2021-09-02 DIAGNOSIS — Z79899 Other long term (current) drug therapy: Secondary | ICD-10-CM | POA: Diagnosis not present

## 2021-09-02 DIAGNOSIS — R0902 Hypoxemia: Secondary | ICD-10-CM | POA: Diagnosis not present

## 2021-09-02 DIAGNOSIS — I442 Atrioventricular block, complete: Secondary | ICD-10-CM | POA: Diagnosis not present

## 2021-09-02 DIAGNOSIS — Z9981 Dependence on supplemental oxygen: Secondary | ICD-10-CM | POA: Diagnosis not present

## 2021-09-02 DIAGNOSIS — Z1611 Resistance to penicillins: Secondary | ICD-10-CM | POA: Diagnosis present

## 2021-09-02 DIAGNOSIS — Z20822 Contact with and (suspected) exposure to covid-19: Secondary | ICD-10-CM | POA: Diagnosis present

## 2021-09-02 DIAGNOSIS — A419 Sepsis, unspecified organism: Secondary | ICD-10-CM | POA: Diagnosis not present

## 2021-09-02 DIAGNOSIS — J9621 Acute and chronic respiratory failure with hypoxia: Secondary | ICD-10-CM | POA: Diagnosis not present

## 2021-09-02 DIAGNOSIS — A4151 Sepsis due to Escherichia coli [E. coli]: Secondary | ICD-10-CM | POA: Diagnosis present

## 2021-09-02 DIAGNOSIS — G40909 Epilepsy, unspecified, not intractable, without status epilepticus: Secondary | ICD-10-CM | POA: Diagnosis present

## 2021-09-02 DIAGNOSIS — E785 Hyperlipidemia, unspecified: Secondary | ICD-10-CM | POA: Diagnosis not present

## 2021-09-02 DIAGNOSIS — E876 Hypokalemia: Secondary | ICD-10-CM | POA: Diagnosis present

## 2021-09-02 DIAGNOSIS — I495 Sick sinus syndrome: Secondary | ICD-10-CM | POA: Diagnosis present

## 2021-09-02 DIAGNOSIS — N390 Urinary tract infection, site not specified: Secondary | ICD-10-CM | POA: Diagnosis not present

## 2021-09-02 DIAGNOSIS — R197 Diarrhea, unspecified: Secondary | ICD-10-CM | POA: Diagnosis not present

## 2021-09-02 DIAGNOSIS — J438 Other emphysema: Secondary | ICD-10-CM | POA: Diagnosis present

## 2021-09-02 DIAGNOSIS — R079 Chest pain, unspecified: Secondary | ICD-10-CM | POA: Diagnosis not present

## 2021-09-02 DIAGNOSIS — Z923 Personal history of irradiation: Secondary | ICD-10-CM | POA: Diagnosis not present

## 2021-09-02 DIAGNOSIS — F32A Depression, unspecified: Secondary | ICD-10-CM | POA: Diagnosis not present

## 2021-09-03 ENCOUNTER — Encounter: Payer: Medicare Other | Attending: Pulmonary Disease

## 2021-09-03 VITALS — Ht 68.75 in | Wt 191.2 lb

## 2021-09-03 DIAGNOSIS — R0602 Shortness of breath: Secondary | ICD-10-CM | POA: Insufficient documentation

## 2021-09-03 NOTE — Progress Notes (Signed)
Daily Session Note  Patient Details  Name: Diana Cook MRN: [id number] Date of Birth: 18-Jun-1950 Referring Provider:   Flowsheet Row Pulmonary Rehab from 05/16/2021 in [facility] Cardiac and Pulmonary Rehab  Referring Provider Patsey Berthold       Encounter Date: 09/03/2021  Check In:  Session Check In - 09/03/21 0920       Check-In   Supervising physician immediately available to respond to emergencies See telemetry face sheet for immediately available ER MD    Location [facility]-Cardiac & Pulmonary Rehab    Staff Present Earlean Shawl, BS, ACSM CEP, Exercise Physiologist;Alexzavier Girardin Rosalia Hammers, MPA, RN;Jessica Luan Pulling, MA, RCEP, CCRP, CCET    Virtual Visit No    Medication changes reported     No    Fall or balance concerns reported    No    Tobacco Cessation No Change    Warm-up and Cool-down Performed on first and last piece of equipment    Resistance Training Performed Yes    VAD Patient? No    PAD/SET Patient? No      Pain Assessment   Currently in Pain? No/denies                Social History   Tobacco Use  Smoking Status Former   Packs/day: 1.50   Years: 42.00   Total pack years: 63.00   Types: Cigarettes   Quit date: 11/11/2010   Years since quitting: 10.8  Smokeless Tobacco Never    Goals Met:  Independence with exercise equipment Exercise tolerated well No report of concerns or symptoms today Strength training completed today  Goals Unmet:  Not Applicable  Comments: Pt able to follow exercise prescription today without complaint.  Will continue to monitor for progression.   [place] Name 05/16/21 1638 09/03/21 0940       6 Minute Walk   Phase Initial Discharge    Distance 1050 feet 1130 feet    Distance % Change -- 7.62 %    Distance Feet Change -- 80 ft    Walk Time 6 minutes 6 minutes    # of Rest Breaks 0 0    MPH 1.99 2.14    METS 2 2.28    RPE 11 13    Perceived Dyspnea  3 2    VO2 Peak 7.2 7.96    Symptoms Yes (comment) Yes  (comment)    Comments SOB SOB    Resting HR 85 bpm 74 bpm    Resting BP 110/58 112/60    Resting Oxygen Saturation  93 % 91 %    Exercise Oxygen Saturation  during 6 min walk 87 % 82 %    Max Ex. HR 92 bpm 83 bpm    Max Ex. BP 114/58 136/74    2 Minute Post BP 114/64 132/70      Interval HR   1 Minute HR 89 --    2 Minute HR 72 --    3 Minute HR 90 --    4 Minute HR 92 --    5 Minute HR 92 --    6 Minute HR 84 --    2 Minute Post HR 90 --    Interval Heart Rate? Yes --      Interval Oxygen   Interval Oxygen? Yes --    Baseline Oxygen Saturation % 93 % --    1 Minute Oxygen Saturation % 90 % --    1 Minute Liters of  Oxygen 2.5 L --    2 Minute Oxygen Saturation % 87 % --    2 Minute Liters of Oxygen 2.5 L --    3 Minute Oxygen Saturation % 87 % --    3 Minute Liters of Oxygen 2.5 L --    4 Minute Oxygen Saturation % 90 % --    4 Minute Liters of Oxygen 2.5 L --    5 Minute Oxygen Saturation % 89 % --    5 Minute Liters of Oxygen 2.5 L --    6 Minute Oxygen Saturation % 89 % --    6 Minute Liters of Oxygen 2.5 L --    2 Minute Post Oxygen Saturation % 95 % --    2 Minute Post Liters of Oxygen 2.5 L --              Dr. Emily Filbert is Medical Director for [place].  Dr. Ottie Glazier is Medical Director for [facility] Pulmonary Rehabilitation.

## 2021-09-03 NOTE — Patient Instructions (Signed)
Discharge Patient Instructions  Patient Details  Name: Diana Cook MRN: [id number] Date of Birth: Apr 28, 1950 Referring Provider:  Judy Pimple, MD   Number of Visits: 14  Reason for Discharge:  Patient reached a stable level of exercise. Patient independent in their exercise. Patient has met program and personal goals.  Smoking History:  Social History   Tobacco Use  Smoking Status Former   Packs/day: 1.50   Years: 42.00   Total pack years: 63.00   Types: Cigarettes   Quit date: 11/11/2010   Years since quitting: 10.8  Smokeless Tobacco Never    Diagnosis:  Shortness of breath  Initial Exercise Prescription:  Initial Exercise Prescription - 05/16/21 1600       Date of Initial Exercise RX and Referring Provider   Date 05/16/21    Referring Provider Jayme Cloud      Oxygen   Oxygen Continuous    Liters 3    Maintain Oxygen Saturation 88% or higher      Treadmill   MPH 1.5    Grade 0    Minutes 15    METs 2      Arm Ergometer   Level 1    RPM 25    Minutes 15    METs 2      Recumbant Elliptical   Level 1    RPM 50    Minutes 15    METs 2      REL-XR   Level 1    Speed 50    Minutes 15    METs 2      Track   Laps 20    Minutes 15    METs 2      Prescription Details   Frequency (times per week) 3    Duration Progress to 30 minutes of continuous aerobic without signs/symptoms of physical distress      Intensity   THRR 40-80% of Max Heartrate 110-135    Ratings of Perceived Exertion 11-15    Perceived Dyspnea 0-4      Resistance Training   Training Prescription Yes    Weight 3 lb    Reps 10-15             Discharge Exercise Prescription (Final Exercise Prescription Changes):  Exercise Prescription Changes - 08/26/21 0700       Response to Exercise   Blood Pressure (Admit) 126/64    Blood Pressure (Exit) 100/56    Heart Rate (Admit) 80 bpm    Heart Rate (Exercise) 102 bpm    Heart Rate (Exit) 84 bpm    Oxygen Saturation  (Admit) 90 %    Oxygen Saturation (Exercise) 88 %    Oxygen Saturation (Exit) 94 %    Rating of Perceived Exertion (Exercise) 15    Perceived Dyspnea (Exercise) 2    Symptoms SOB    Duration Continue with 30 min of aerobic exercise without signs/symptoms of physical distress.    Intensity THRR unchanged      Progression   Progression Continue to progress workloads to maintain intensity without signs/symptoms of physical distress.    Average METs 2.37      Resistance Training   Training Prescription Yes    Weight 3 lb    Reps 10-15      Interval Training   Interval Training No      Oxygen   Oxygen Continuous    Liters 3      Treadmill   MPH 2  Grade 0    Minutes 15    METs 2.53      NuStep   Level 1    Minutes 15    METs 2.5      Arm Ergometer   Level 1    Minutes 15    METs 1.9      REL-XR   Level 1    Minutes 15      Home Exercise Plan   Plans to continue exercise at Home (comment)   walking and staff Youtube videos   Frequency Add 2 additional days to program exercise sessions.   Start with 1   Initial Home Exercises Provided 06/18/21      Oxygen   Maintain Oxygen Saturation 88% or higher             Functional Capacity:  6 Minute Walk     Row Name 05/16/21 1638 09/03/21 0940       6 Minute Walk   Phase Initial Discharge    Distance 1050 feet 1130 feet    Distance % Change -- 7.62 %    Distance Feet Change -- 80 ft    Walk Time 6 minutes 6 minutes    # of Rest Breaks 0 0    MPH 1.99 2.14    METS 2 2.28    RPE 11 13    Perceived Dyspnea  3 2    VO2 Peak 7.2 7.96    Symptoms Yes (comment) Yes (comment)    Comments SOB SOB    Resting HR 85 bpm 74 bpm    Resting BP 110/58 112/60    Resting Oxygen Saturation  93 % 91 %    Exercise Oxygen Saturation  during 6 min walk 87 % 82 %    Max Ex. HR 92 bpm 83 bpm    Max Ex. BP 114/58 136/74    2 Minute Post BP 114/64 132/70      Interval HR   1 Minute HR 89 --    2 Minute HR 72 --    3  Minute HR 90 --    4 Minute HR 92 --    5 Minute HR 92 --    6 Minute HR 84 --    2 Minute Post HR 90 --    Interval Heart Rate? Yes --      Interval Oxygen   Interval Oxygen? Yes --    Baseline Oxygen Saturation % 93 % --    1 Minute Oxygen Saturation % 90 % --    1 Minute Liters of Oxygen 2.5 L --    2 Minute Oxygen Saturation % 87 % --    2 Minute Liters of Oxygen 2.5 L --    3 Minute Oxygen Saturation % 87 % --    3 Minute Liters of Oxygen 2.5 L --    4 Minute Oxygen Saturation % 90 % --    4 Minute Liters of Oxygen 2.5 L --    5 Minute Oxygen Saturation % 89 % --    5 Minute Liters of Oxygen 2.5 L --    6 Minute Oxygen Saturation % 89 % --    6 Minute Liters of Oxygen 2.5 L --    2 Minute Post Oxygen Saturation % 95 % --    2 Minute Post Liters of Oxygen 2.5 L --            Nutrition & Weight - Outcomes:  Pre  Biometrics - 05/16/21 1646       Pre Biometrics   Height 5' 8.75" (1.746 m)    Weight 199 lb 11.2 oz (90.6 kg)    BMI (Calculated) 29.71    Single Leg Stand 4.55 seconds             Post Biometrics - 09/03/21 0941        Post  Biometrics   Height 5' 8.75" (1.746 m)    Weight 191 lb 3.2 oz (86.7 kg)    BMI (Calculated) 28.45    Single Leg Stand 1.8 seconds             Nutrition:  Nutrition Therapy & Goals - 05/28/21 1133       Nutrition Therapy   Diet Heart healthy, low Na, pulmonary MNT    Drug/Food Interactions Statins/Certain Fruits    Protein (specify units) 100g   1.2xkg (COPD)   Fiber 25 grams    Whole Grain Foods 3 servings    Saturated Fats 16 max. grams    Fruits and Vegetables 8 servings/day    Sodium 2 grams      Personal Nutrition Goals   Nutrition Goal ST: try some suggestions (Suggested cutting up fruits and vegetables while sitting down, roasting vegetables and chicken, using cut up vegetables to have salads more often, utilizing microwave ready grains, and replacing high caloire dips with low fat greek yogurt and  hummus) in addiiton to trying out pre-made meals. Honor hunger.  LT: limit saturated fat < 16g/day, limit sodium <2g/day, eat 8 fruit/vegetables per day, make heart healthy changes that work for her    Comments 71 y.o. F admitted to pulmonary rehab for shortness of breath. PMHx includes CAD, HFpEF, COPD, GERD, osteoporosis, former smoker. Relevant medications includes lipitor, lasix, prozac, CA w/ vit D, omeprazole. PYP Score: 53. Vegetables & Fruits 6/12. Breads, Grains & Cereals 9/12. Red & Processed Meat 8/12. Poultry 022. Fish & Shellfish 0/4. Beans, Nuts & Seeds 3/4. Milk & Dairy Foods 2/6. Toppings, Oils, Seasonings & Salt 6/20. Sweets, Snacks & Restaurant Food 9/14. Beverages 8/10.  B: [employer] (eggs with bacon - sometimes no bacon) and fruit L: Sandwiches (ham and cheese with whole grain bread), pretzels with 2 tbsp of pimento cheese, or salad. S: chicken salad and dips with chips. She has done weight watchers in the past and maintained weight for 3 years, since reduing activity from shortness of breath she has gained weight. Discussed honoring hunger and limiting excess calories. Discussed heart healthy eating and pulmonary MNT - will need to review again as time was limited. She would like to get meals she could put together like [employer] nutrition - advised avoiding calorie smart as they can be ~350kcal and keto as that would limit CHO and could also be lower in calories. Discussed some limitations with pre-made meals like less control, higher salt, and less whole grains. Suggested cutting up fruits and vegetables while sitting down, roasting vegetables and chicken, using cut up vegetables to have salads more often, utilizing microwave ready grains, and replacing high caloire dips with low fat greek yogurt and hummus.      Intervention Plan   Intervention Prescribe, educate and counsel regarding individualized specific dietary modifications aiming towards targeted core components such as weight,  hypertension, lipid management, diabetes, heart failure and other comorbidities.;Nutrition handout(s) given to patient.    Expected Outcomes Short Term Goal: Understand basic principles of dietary content, such as calories, fat, sodium, cholesterol and nutrients.;Short  Term Goal: A plan has been developed with personal nutrition goals set during dietitian appointment.;Long Term Goal: Adherence to prescribed nutrition plan.

## 2021-09-04 ENCOUNTER — Other Ambulatory Visit: Payer: Self-pay

## 2021-09-04 ENCOUNTER — Other Ambulatory Visit: Payer: Self-pay | Admitting: Pulmonary Disease

## 2021-09-04 ENCOUNTER — Encounter: Payer: Self-pay | Admitting: *Deleted

## 2021-09-04 ENCOUNTER — Emergency Department: Payer: Medicare Other

## 2021-09-04 ENCOUNTER — Inpatient Hospital Stay
Admission: EM | Admit: 2021-09-04 | Discharge: 2021-09-09 | DRG: 871 | Disposition: A | Discharge status: Home / Self Care | Payer: Medicare Other | Attending: Internal Medicine | Admitting: Internal Medicine

## 2021-09-04 DIAGNOSIS — Z923 Personal history of irradiation: Secondary | ICD-10-CM

## 2021-09-04 DIAGNOSIS — Z950 Presence of cardiac pacemaker: Secondary | ICD-10-CM

## 2021-09-04 DIAGNOSIS — F32A Depression, unspecified: Secondary | ICD-10-CM

## 2021-09-04 DIAGNOSIS — G40909 Epilepsy, unspecified, not intractable, without status epilepticus: Secondary | ICD-10-CM | POA: Diagnosis present

## 2021-09-04 DIAGNOSIS — Z87891 Personal history of nicotine dependence: Secondary | ICD-10-CM

## 2021-09-04 DIAGNOSIS — J9621 Acute and chronic respiratory failure with hypoxia: Secondary | ICD-10-CM | POA: Diagnosis not present

## 2021-09-04 DIAGNOSIS — Z823 Family history of stroke: Secondary | ICD-10-CM

## 2021-09-04 DIAGNOSIS — A419 Sepsis, unspecified organism: Secondary | ICD-10-CM

## 2021-09-04 DIAGNOSIS — E785 Hyperlipidemia, unspecified: Secondary | ICD-10-CM

## 2021-09-04 DIAGNOSIS — K219 Gastro-esophageal reflux disease without esophagitis: Secondary | ICD-10-CM

## 2021-09-04 DIAGNOSIS — I774 Celiac artery compression syndrome: Secondary | ICD-10-CM | POA: Diagnosis not present

## 2021-09-04 DIAGNOSIS — R0789 Other chest pain: Principal | ICD-10-CM

## 2021-09-04 DIAGNOSIS — I252 Old myocardial infarction: Secondary | ICD-10-CM

## 2021-09-04 DIAGNOSIS — Z8249 Family history of ischemic heart disease and other diseases of the circulatory system: Secondary | ICD-10-CM

## 2021-09-04 DIAGNOSIS — K529 Noninfective gastroenteritis and colitis, unspecified: Secondary | ICD-10-CM

## 2021-09-04 DIAGNOSIS — Z20822 Contact with and (suspected) exposure to covid-19: Secondary | ICD-10-CM | POA: Diagnosis present

## 2021-09-04 DIAGNOSIS — E876 Hypokalemia: Secondary | ICD-10-CM | POA: Diagnosis present

## 2021-09-04 DIAGNOSIS — R197 Diarrhea, unspecified: Secondary | ICD-10-CM | POA: Diagnosis not present

## 2021-09-04 DIAGNOSIS — Z1611 Resistance to penicillins: Secondary | ICD-10-CM | POA: Diagnosis present

## 2021-09-04 DIAGNOSIS — I495 Sick sinus syndrome: Secondary | ICD-10-CM | POA: Diagnosis present

## 2021-09-04 DIAGNOSIS — I480 Paroxysmal atrial fibrillation: Secondary | ICD-10-CM | POA: Diagnosis present

## 2021-09-04 DIAGNOSIS — A4150 Gram-negative sepsis, unspecified: Secondary | ICD-10-CM | POA: Diagnosis present

## 2021-09-04 DIAGNOSIS — I2510 Atherosclerotic heart disease of native coronary artery without angina pectoris: Secondary | ICD-10-CM | POA: Diagnosis present

## 2021-09-04 DIAGNOSIS — Z9981 Dependence on supplemental oxygen: Secondary | ICD-10-CM

## 2021-09-04 DIAGNOSIS — A4151 Sepsis due to Escherichia coli [E. coli]: Principal | ICD-10-CM | POA: Diagnosis present

## 2021-09-04 DIAGNOSIS — Z90710 Acquired absence of both cervix and uterus: Secondary | ICD-10-CM

## 2021-09-04 DIAGNOSIS — Z7901 Long term (current) use of anticoagulants: Secondary | ICD-10-CM

## 2021-09-04 DIAGNOSIS — Z79899 Other long term (current) drug therapy: Secondary | ICD-10-CM

## 2021-09-04 DIAGNOSIS — Z853 Personal history of malignant neoplasm of breast: Secondary | ICD-10-CM

## 2021-09-04 DIAGNOSIS — N390 Urinary tract infection, site not specified: Secondary | ICD-10-CM | POA: Diagnosis present

## 2021-09-04 DIAGNOSIS — R079 Chest pain, unspecified: Secondary | ICD-10-CM | POA: Diagnosis not present

## 2021-09-04 DIAGNOSIS — J438 Other emphysema: Secondary | ICD-10-CM | POA: Diagnosis present

## 2021-09-04 DIAGNOSIS — N3000 Acute cystitis without hematuria: Secondary | ICD-10-CM | POA: Diagnosis not present

## 2021-09-04 DIAGNOSIS — R0602 Shortness of breath: Secondary | ICD-10-CM

## 2021-09-04 DIAGNOSIS — R911 Solitary pulmonary nodule: Secondary | ICD-10-CM

## 2021-09-04 LAB — CBC WITH DIFFERENTIAL/PLATELET
Abs Immature Granulocytes: 0.04 K/uL (ref 0.00–0.07)
Basophils Absolute: 0 K/uL (ref 0.0–0.1)
Basophils Relative: 0 %
Eosinophils Absolute: 0 K/uL (ref 0.0–0.5)
Eosinophils Relative: 0 %
HCT: 37.7 % (ref 36.0–46.0)
Hemoglobin: 12 g/dL (ref 12.0–15.0)
Immature Granulocytes: 0 %
Lymphocytes Relative: 3 %
Lymphs Abs: 0.3 K/uL — ABNORMAL LOW (ref 0.7–4.0)
MCH: 29.1 pg (ref 26.0–34.0)
MCHC: 31.8 g/dL (ref 30.0–36.0)
MCV: 91.3 fL (ref 80.0–100.0)
Monocytes Absolute: 0.6 K/uL (ref 0.1–1.0)
Monocytes Relative: 6 %
Neutro Abs: 9.2 K/uL — ABNORMAL HIGH (ref 1.7–7.7)
Neutrophils Relative %: 91 %
Platelets: 224 K/uL (ref 150–400)
RBC: 4.13 MIL/uL (ref 3.87–5.11)
RDW: 12.8 % (ref 11.5–15.5)
WBC: 10.2 K/uL (ref 4.0–10.5)
nRBC: 0 % (ref 0.0–0.2)

## 2021-09-04 LAB — COMPREHENSIVE METABOLIC PANEL WITH GFR
ALT: 17 U/L (ref 0–44)
AST: 24 U/L (ref 15–41)
Albumin: 3.9 g/dL (ref 3.5–5.0)
Alkaline Phosphatase: 78 U/L (ref 38–126)
Anion gap: 11 (ref 5–15)
BUN: 23 mg/dL (ref 8–23)
CO2: 20 mmol/L — ABNORMAL LOW (ref 22–32)
Calcium: 9.1 mg/dL (ref 8.9–10.3)
Chloride: 103 mmol/L (ref 98–111)
Creatinine, Ser: 1.07 mg/dL — ABNORMAL HIGH (ref 0.44–1.00)
GFR, Estimated: 55 mL/min — ABNORMAL LOW
Glucose, Bld: 131 mg/dL — ABNORMAL HIGH (ref 70–99)
Potassium: 3.9 mmol/L (ref 3.5–5.1)
Sodium: 134 mmol/L — ABNORMAL LOW (ref 135–145)
Total Bilirubin: 1.4 mg/dL — ABNORMAL HIGH (ref 0.3–1.2)
Total Protein: 7.5 g/dL (ref 6.5–8.1)

## 2021-09-04 LAB — URINALYSIS, ROUTINE W REFLEX MICROSCOPIC
Bilirubin Urine: NEGATIVE
Glucose, UA: NEGATIVE mg/dL
Ketones, ur: NEGATIVE mg/dL
Nitrite: NEGATIVE
Protein, ur: 30 mg/dL — AB
Specific Gravity, Urine: 1.034 — ABNORMAL HIGH (ref 1.005–1.030)
WBC, UA: >50 WBC/hpf — ABNORMAL HIGH (ref 0–5)
pH: 5 (ref 5.0–8.0)

## 2021-09-04 LAB — PROTIME-INR
INR: 1.4 — ABNORMAL HIGH (ref 0.8–1.2)
Prothrombin Time: 17.2 seconds — ABNORMAL HIGH (ref 11.4–15.2)

## 2021-09-04 LAB — LIPASE, BLOOD: Lipase: 23 U/L (ref 11–51)

## 2021-09-04 LAB — PROCALCITONIN: Procalcitonin: 2.43 ng/mL

## 2021-09-04 LAB — TROPONIN I (HIGH SENSITIVITY): Troponin I (High Sensitivity): 18 ng/L — ABNORMAL HIGH (ref <18)

## 2021-09-04 LAB — SARS CORONAVIRUS 2 BY RT PCR: SARS Coronavirus 2 by RT PCR: NEGATIVE

## 2021-09-04 LAB — LACTIC ACID, PLASMA: Lactic Acid, Venous: 1.5 mmol/L (ref 0.5–1.9)

## 2021-09-04 MED ORDER — SODIUM CHLORIDE 0.9 % IV BOLUS
500.0000 mL | Freq: Once | INTRAVENOUS | Status: AC
  Administered 2021-09-04: 500 mL via INTRAVENOUS

## 2021-09-04 MED ORDER — ONDANSETRON HCL 4 MG/2ML IJ SOLN
4.0000 mg | Freq: Once | INTRAMUSCULAR | Status: AC
  Administered 2021-09-04: 4 mg via INTRAVENOUS
  Filled 2021-09-04: qty 2

## 2021-09-04 MED ORDER — IOHEXOL 350 MG/ML SOLN
100.0000 mL | Freq: Once | INTRAVENOUS | Status: AC | PRN
  Administered 2021-09-04: 100 mL via INTRAVENOUS

## 2021-09-04 MED ORDER — SODIUM CHLORIDE 0.9 % IV SOLN
1.0000 g | Freq: Once | INTRAVENOUS | Status: AC
  Administered 2021-09-05: 1 g via INTRAVENOUS
  Filled 2021-09-04: qty 10

## 2021-09-04 NOTE — ED Provider Notes (Signed)
[facility] Provider Note    Event Date/Time   First MD Initiated Contact with Patient 09/04/21 2126     (approximate)   History   Chest Pain   HPI  Diana Cook is a 71 y.o. female with history of seizures, remote breast cancer, pacemaker who comes in with chest and back pain that started last night with increasing worsening today.  She also has some nausea and diarrhea and vomiting since last night. Denies any recent [place] out of [place].  Reports that she had a UTI about a month ago and got some antibiotics and feels like she has started to get better but is not have any urinary symptoms but just overall feeling really fatigued, weak, nauseous.  Feels like she has less fluid on her.  Patient does report that she is on Eliquis has been compliant with this.  Denies any missed doses.  She reports that her oxygen level typically runs 85-92 even on her baseline 4 L of oxygen.  She is a little bit of shortness of breath but not significantly different than her baseline.  She does report intermittent she has severe pain in her chest going into her back.  Physical Exam   Triage Vital Signs: ED Triage Vitals  Enc Vitals Group     BP 09/04/21 2038 (!) 103/49     Pulse Rate 09/04/21 2038 95     Resp 09/04/21 2038 (!) 28     Temp 09/04/21 2038 99.9 F (37.7 C)     Temp Source 09/04/21 2038 Oral     SpO2 09/04/21 2038 (!) 87 %     Weight 09/04/21 2042 190 lb (86.2 kg)     Height 09/04/21 2042 5\' 8"  (1.727 m)     Head Circumference --      Peak Flow --      Pain Score 09/04/21 2041 7     Pain Loc --      Pain Edu? --      Excl. in [place]? --     Most recent vital signs: Vitals:   09/04/21 2038  BP: (!) 103/49  Pulse: 95  Resp: (!) 28  Temp: 99.9 F (37.7 C)  SpO2: (!) 87%     General: Awake, no distress.  CV:  Good peripheral perfusion.  Resp:  Normal effort.  Abd:  No distention.  Some mild tenderness Other:  No swelling the leg   ED  Results / Procedures / Treatments   Labs (all labs ordered are listed, but only abnormal results are displayed) Labs Reviewed  CBC WITH DIFFERENTIAL/PLATELET - Abnormal; Notable for the following components:      Result Value   Neutro Abs 9.2 (*)    Lymphs Abs 0.3 (*)    All other components within normal limits  CULTURE, BLOOD (ROUTINE X 2)  CULTURE, BLOOD (ROUTINE X 2)  SARS CORONAVIRUS 2 BY RT PCR  COMPREHENSIVE METABOLIC PANEL  LACTIC ACID, PLASMA  LACTIC ACID, PLASMA  PROTIME-INR  URINALYSIS, ROUTINE W REFLEX MICROSCOPIC     EKG  My interpretation of EKG:  Atrial sensed ventricular paced rhythm with a rate of 96 without any ST elevation, T wave version aVL  RADIOLOGY I have reviewed the xray personally and interpreted mild left atelectasis  Critical Care performed: No  .1-3 Lead EKG Interpretation  Performed by: Vanessa [place], MD Authorized by: Vanessa [place], MD     Interpretation: normal     ECG  rate:  90   ECG rate assessment: normal     Rhythm: paced     Ectopy: none     Conduction: normal   .Critical Care  Performed by: Vanessa [place]kal[place], MD Authorized by: Vanessa [place], MD   Critical care provider statement:    Critical care time (minutes):  30   Critical care was necessary to treat or prevent imminent or life-threatening deterioration of the following conditions:  Sepsis   Critical care was time spent personally by me on the following activities:  Development of treatment plan with patient or surrogate, discussions with consultants, evaluation of patient's response to treatment, examination of patient, ordering and review of laboratory studies, ordering and review of radiographic studies, ordering and performing treatments and interventions, pulse oximetry, re-evaluation of patient's condition and review of old charts    [facility] ED: Medications - No data to display   IMPRESSION / MDM / [place] / ED COURSE  I reviewed the  triage vital signs and the nursing notes.   Patient's presentation is most consistent with acute presentation with potential threat to life or bodily function.   Differential includes sepsis, bacteremia, pneumonia, COVID, UTI.  Given the chest pain radiating to the back will get CT dissection to look for any evidence of dissection, pneumonia, abdominal pathology that could be causing a fever.  I have lower suspicion for PE given she is already on Eliquis.  She reports that her oxygen levels typically run 87 to 93% which is where she is currently out on her 4 L.  CMP shows slightly elevated creatinine.  Lactate normal white count normal COVID-negative  UA looks consistent with UTI.  Will send for culture.    We will give a dose of ceftriaxone.  Discussed the incidental findings with the patient.  Added on stool studies.  Discussed going home versus admission but given the recurrent UTI not getting better and fevers at home patient would prefer admission with her nausea and vomiting.  Will discuss with hospital team  Patient meets sepsis criteria with elevated heart rate and elevated respiratory rate therefore sepsis alert was called.  Patient given some gentle hydration due to her history of-to be on Lasix.  Heart rates have come down.  The patient is on the cardiac monitor to evaluate for evidence of arrhythmia and/or significant heart rate changes.      FINAL CLINICAL IMPRESSION(S) / ED DIAGNOSES   Final diagnoses:  Atypical chest pain  Acute cystitis without hematuria  Enteritis     Rx / DC Orders   ED Discharge Orders     None        Note:  This document was prepared using Dragon voice recognition software and may include unintentional dictation errors.   Vanessa [place], MD 09/04/21 2359    Vanessa , MD 09/05/21 [phone]

## 2021-09-04 NOTE — Progress Notes (Signed)
Pulmonary Individual Treatment Plan  Patient Details  Name: Diana Cook MRN: [id number] Date of Birth: 1951-01-22 Referring Provider:   Flowsheet Row Pulmonary Rehab from 05/16/2021 in [facility] Cardiac and Pulmonary Rehab  Referring Provider Patsey Berthold       Initial Encounter Date:  Flowsheet Row Pulmonary Rehab from 05/16/2021 in [facility] Cardiac and Pulmonary Rehab  Date 05/16/21       Visit Diagnosis: Shortness of breath  Patient's Home Medications on Admission:  Current Outpatient Medications:    acetaminophen (TYLENOL) 500 MG tablet, Take 500-1,000 mg by mouth every 6 (six) hours as needed for mild pain or headache. , Disp: , Rfl:    albuterol (VENTOLIN HFA) 108 (90 Base) MCG/ACT inhaler, Inhale 2 puffs into the lungs every 4 (four) hours as needed for wheezing or shortness of breath., Disp: 18 g, Rfl: 1   apixaban (ELIQUIS) 5 MG TABS tablet, Take 1 tablet by mouth twice daily, Disp: 60 tablet, Rfl: 5   atorvastatin (LIPITOR) 40 MG tablet, Take 1 tablet by mouth once daily, Disp: 90 tablet, Rfl: 0   benzonatate (TESSALON) 100 MG capsule, Take by mouth as needed., Disp: , Rfl:    FLUoxetine (PROZAC) 20 MG capsule, Take 1 capsule by mouth once daily, Disp: 90 capsule, Rfl: 3   furosemide (LASIX) 40 MG tablet, TAKE 1 TABLET BY MOUTH ONCE DAILY AS NEEDED, Disp: 90 tablet, Rfl: 0   levETIRAcetam (KEPPRA) 250 MG tablet, Take 250 mg by mouth 2 (two) times daily. , Disp: , Rfl:    omeprazole (PRILOSEC) 40 MG capsule, Take 40 mg by mouth daily., Disp: , Rfl:    OXYGEN, Inhale 3 L into the lungs at bedtime. , Disp: , Rfl:    STIOLTO RESPIMAT 2.5-2.5 MCG/ACT AERS, INHALE 2 PUFFS BY MOUTH ONCE DAILY, Disp: 4 g, Rfl: 0   tretinoin (RETIN-A) 0.1 % cream, Apply topically at bedtime., Disp: 45 g, Rfl: 1   VITAMIN D PO, Take 1 tablet by mouth daily., Disp: , Rfl:    zolpidem (AMBIEN) 10 MG tablet, TAKE 1/2 TO 1 (ONE-HALF TO ONE) TABLET BY MOUTH AT BEDTIME AS NEEDED FOR SLEEP, Disp: 30 tablet, Rfl:  3  Past Medical History: Past Medical History:  Diagnosis Date   Allergic rhinitis    Arthritis    Asthma    as a child, mild now   Breast cancer ([place]) 12/2015   right breast cancer, lumpectomy and mammosite    Colon polyps    colonoscopy 7/08, tubular adenoma   Complete heart block ([place]) 09/2011   s/p PPM implanted in [place] [facility]   COPD (chronic obstructive pulmonary disease) ([place])    Myocardial infarction ([place]) 2011   Osteopenia 10/2015   Pacemaker    2011   Paroxysmal atrial fibrillation ([place]) 03/2021   Incidentally detected on pacemaker interrogation   Personal history of radiation therapy 2017   right breast ca, mammosite placed   Seizure disorder ([place])    Seizures ([place])    first one was when she was 71 years old    Small bowel obstruction ([place])    1988 and 2002   Tobacco abuse     Tobacco Use: Social History   Tobacco Use  Smoking Status Former   Packs/day: 1.50   Years: 42.00   Total pack years: 63.00   Types: Cigarettes   Quit date: 11/11/2010   Years since quitting: 10.8  Smokeless Tobacco Never    Labs: Review Flowsheet  More data exists  Latest Ref Rng & Units 12/28/2018 11/16/2019 03/09/2020 10/24/2020 07/23/2021  Labs for ITP Cardiac and Pulmonary Rehab  Cholestrol 0 - 200 mg/dL 205  136  161  - 123   LDL (calc) 0 - 99 mg/dL 124  59  72  - 50   HDL-C >39.00 mg/dL 63.30  57  65.90  - 49.40   Trlycerides 0.0 - 149.0 mg/dL 90.0  109  120.0  - 116.0   Hemoglobin A1c 4.6 - 6.5 % 5.6  - 5.8  6.0  5.6      Pulmonary Assessment Scores:  Pulmonary Assessment Scores     Row Name 05/16/21 1646         ADL UCSD   SOB Score total 68     Rest 2     Walk 2     Stairs 4     Bath 2     Dress 1     Shop 4       CAT Score   CAT Score 26       mMRC Score   mMRC Score 1              UCSD: Self-administered rating of dyspnea associated with activities of daily living (ADLs) 6-point scale (0 = "not at all" to 5 = "maximal or unable to do  because of breathlessness")  Scoring Scores range from 0 to 120.  Minimally important difference is 5 units  CAT: CAT can identify the health impairment of COPD patients and is better correlated with disease progression.  CAT has a scoring range of zero to 40. The CAT score is classified into four groups of low (less than 10), medium (10 - 20), high (21-30) and very high (31-40) based on the impact level of disease on health status. A CAT score over 10 suggests significant symptoms.  A worsening CAT score could be explained by an exacerbation, poor medication adherence, poor inhaler technique, or progression of COPD or comorbid conditions.  CAT MCID is 2 points  mMRC: mMRC (Modified Medical Research Council) Dyspnea Scale is used to assess the degree of baseline functional disability in patients of respiratory disease due to dyspnea. No minimal important difference is established. A decrease in score of 1 point or greater is considered a positive change.   Pulmonary Function Assessment:   Exercise Target Goals: Exercise Program Goal: Individual exercise prescription set using results from initial 6 min walk test and THRR while considering  patient's activity barriers and safety.   Exercise Prescription Goal: Initial exercise prescription builds to 30-45 minutes a day of aerobic activity, 2-3 days per week.  Home exercise guidelines will be given to patient during program as part of exercise prescription that the participant will acknowledge.  Education: Aerobic Exercise: - Group verbal and visual presentation on the components of exercise prescription. Introduces F.I.T.T principle from ACSM for exercise prescriptions.  Reviews F.I.T.T. principles of aerobic exercise including progression. Written material given at graduation. Flowsheet Row Pulmonary Rehab from 08/01/2021 in [facility] Cardiac and Pulmonary Rehab  Date 06/27/21  Educator [facility]  Instruction Review Code 1- Verbalizes Understanding        Education: Resistance Exercise: - Group verbal and visual presentation on the components of exercise prescription. Introduces F.I.T.T principle from ACSM for exercise prescriptions  Reviews F.I.T.T. principles of resistance exercise including progression. Written material given at graduation. Flowsheet Row Pulmonary Rehab from 08/01/2021 in [facility] Cardiac and Pulmonary Rehab  Date 07/04/21  Educator [facility]  Instruction Review  Code 1- Verbalizes Understanding        Education: Exercise & Equipment Safety: - Individual verbal instruction and demonstration of equipment use and safety with use of the equipment. Flowsheet Row Pulmonary Rehab from 08/01/2021 in [facility] Cardiac and Pulmonary Rehab  Date 05/16/21  Educator [facility]  Instruction Review Code 1- Verbalizes Understanding       Education: Exercise Physiology & General Exercise Guidelines: - Group verbal and written instruction with models to review the exercise physiology of the cardiovascular system and associated critical values. Provides general exercise guidelines with specific guidelines to those with heart or lung disease.  Flowsheet Row Pulmonary Rehab from 08/01/2021 in [facility] Cardiac and Pulmonary Rehab  Date 06/20/21  Educator [facility]  Instruction Review Code 1- [employer] Understanding       Education: Flexibility, Balance, Mind/Body Relaxation: - Group verbal and visual presentation with interactive activity on the components of exercise prescription. Introduces F.I.T.T principle from ACSM for exercise prescriptions. Reviews F.I.T.T. principles of flexibility and balance exercise training including progression. Also discusses the mind body connection.  Reviews various relaxation techniques to help reduce and manage stress (i.e. Deep breathing, progressive muscle relaxation, and visualization). Balance handout provided to take home. Written material given at graduation.   Activity Barriers & Risk Stratification:  Activity Barriers &  Cardiac Risk Stratification - 05/08/21 1409       Activity Barriers & Cardiac Risk Stratification   Activity Barriers Joint Problems;Shortness of Breath;Deconditioning;Muscular Weakness;Back Problems             6 Minute Walk:  6 Minute Walk     Row Name 05/16/21 1638 09/03/21 0940       6 Minute Walk   Phase Initial Discharge    Distance 1050 feet 1130 feet    Distance % Change -- 7.62 %    Distance Feet Change -- 80 ft    Walk Time 6 minutes 6 minutes    # of Rest Breaks 0 0    MPH 1.99 2.14    METS 2 2.28    RPE 11 13    Perceived Dyspnea  3 2    VO2 Peak 7.2 7.96    Symptoms Yes (comment) Yes (comment)    Comments SOB SOB    Resting HR 85 bpm 74 bpm    Resting BP 110/58 112/60    Resting Oxygen Saturation  93 % 91 %    Exercise Oxygen Saturation  during 6 min walk 87 % 82 %    Max Ex. HR 92 bpm 83 bpm    Max Ex. BP 114/58 136/74    2 Minute Post BP 114/64 132/70      Interval HR   1 Minute HR 89 --    2 Minute HR 72 --    3 Minute HR 90 --    4 Minute HR 92 --    5 Minute HR 92 --    6 Minute HR 84 --    2 Minute Post HR 90 --    Interval Heart Rate? Yes --      Interval Oxygen   Interval Oxygen? Yes --    Baseline Oxygen Saturation % 93 % --    1 Minute Oxygen Saturation % 90 % --    1 Minute Liters of Oxygen 2.5 L --    2 Minute Oxygen Saturation % 87 % --    2 Minute Liters of Oxygen 2.5 L --    3 Minute Oxygen Saturation %  87 % --    3 Minute Liters of Oxygen 2.5 L --    4 Minute Oxygen Saturation % 90 % --    4 Minute Liters of Oxygen 2.5 L --    5 Minute Oxygen Saturation % 89 % --    5 Minute Liters of Oxygen 2.5 L --    6 Minute Oxygen Saturation % 89 % --    6 Minute Liters of Oxygen 2.5 L --    2 Minute Post Oxygen Saturation % 95 % --    2 Minute Post Liters of Oxygen 2.5 L --            Oxygen Initial Assessment:  Oxygen Initial Assessment - 07/25/21 0942       Home Oxygen   Home Oxygen Device Home Concentrator;E-Tanks     Sleep Oxygen Prescription Continuous    Liters per minute 3    Home Exercise Oxygen Prescription Continuous    Liters per minute 3    Home Resting Oxygen Prescription Continuous    Liters per minute 3    Compliance with Home Oxygen Use Yes      Program Oxygen Prescription   Program Oxygen Prescription Continuous    Liters per minute 3      Intervention   Short Term Goals To learn and exhibit compliance with exercise, home and travel O2 prescription;To learn and understand importance of monitoring SPO2 with pulse oximeter and demonstrate accurate use of the pulse oximeter.;To learn and demonstrate proper pursed lip breathing techniques or other breathing techniques. ;To learn and understand importance of maintaining oxygen saturations>88%    Long  Term Goals Verbalizes importance of monitoring SPO2 with pulse oximeter and return demonstration;Exhibits compliance with exercise, home  and travel O2 prescription;Maintenance of O2 saturations>88%;Exhibits proper breathing techniques, such as pursed lip breathing or other method taught during program session             Oxygen Re-Evaluation:  Oxygen Re-Evaluation     Row Name 05/21/21 0924 06/18/21 1350 07/25/21 0942 08/22/21 0933       Program Oxygen Prescription   Program Oxygen Prescription Continuous Continuous -- Continuous    Liters per minute -- 3 -- 3      Home Oxygen   Home Oxygen Device Home Concentrator;E-Tanks Home Concentrator;E-Tanks -- Home Concentrator;E-Tanks    Sleep Oxygen Prescription Continuous Continuous -- Continuous    Liters per minute 2.5 2.5 -- 3    Home Exercise Oxygen Prescription Continuous Continuous -- Continuous    Liters per minute 2.5 2.5 -- 3    Home Resting Oxygen Prescription Continuous Continuous -- Continuous    Liters per minute -- -- -- 3    Compliance with Home Oxygen Use Yes Yes -- Yes      Goals/Expected Outcomes   Short Term Goals To learn and exhibit compliance with exercise, home  and travel O2 prescription;To learn and understand importance of monitoring SPO2 with pulse oximeter and demonstrate accurate use of the pulse oximeter.;To learn and demonstrate proper pursed lip breathing techniques or other breathing techniques. ;To learn and understand importance of maintaining oxygen saturations>88% To learn and exhibit compliance with exercise, home and travel O2 prescription;To learn and understand importance of monitoring SPO2 with pulse oximeter and demonstrate accurate use of the pulse oximeter.;To learn and demonstrate proper pursed lip breathing techniques or other breathing techniques. ;To learn and understand importance of maintaining oxygen saturations>88% -- To learn and exhibit compliance with exercise, home and travel  O2 prescription;To learn and understand importance of monitoring SPO2 with pulse oximeter and demonstrate accurate use of the pulse oximeter.;To learn and demonstrate proper pursed lip breathing techniques or other breathing techniques. ;To learn and understand importance of maintaining oxygen saturations>88%    Long  Term Goals Verbalizes importance of monitoring SPO2 with pulse oximeter and return demonstration;Exhibits compliance with exercise, home  and travel O2 prescription;Maintenance of O2 saturations>88%;Exhibits proper breathing techniques, such as pursed lip breathing or other method taught during program session Verbalizes importance of monitoring SPO2 with pulse oximeter and return demonstration;Exhibits compliance with exercise, home  and travel O2 prescription;Maintenance of O2 saturations>88%;Exhibits proper breathing techniques, such as pursed lip breathing or other method taught during program session -- Verbalizes importance of monitoring SPO2 with pulse oximeter and return demonstration;Exhibits compliance with exercise, home  and travel O2 prescription;Maintenance of O2 saturations>88%;Exhibits proper breathing techniques, such as pursed lip  breathing or other method taught during program session    Comments Reviewed PLB technique with pt.  Talked about how it works and it's importance in maintaining their exercise saturations. Darrin is staying compliant with her oxygen and still using PLB when needed. She hasn't seen too much of a difference with her SOB but feels like she can do more at home. She is compliant with checking her O2 saturations at home which are staying above 88%. [place] is doing well. She continues to watch her O2 and HR at home and reports normal values. She feels her breathing has improved, even though she was recently discharged from the hospital. She is excited to get back into the routine of acitivty at home. [place] contnues doing well. She continues to watch her O2 and HR at home and reports normal values. She feels her breathing has improved, but will still have good days and bad days. She is practicing PLB and doesn't feel a difference, but notices her O2 goes up.    Goals/Expected Outcomes Short: Become more profiecient at using PLB.   Long: Become independent at using PLB. Short: Continue checking O2 levels at home Long: Maintain use of PLB and maintain oxygen greater than 88%. Short: Continue PLB when needed Long: Routine maintenance staying above 88% oxygen. Short: Continue PLB when needed Long: Routine maintenance staying above 88% oxygen.             Oxygen Discharge (Final Oxygen Re-Evaluation):  Oxygen Re-Evaluation - 08/22/21 0933       Program Oxygen Prescription   Program Oxygen Prescription Continuous    Liters per minute 3      Home Oxygen   Home Oxygen Device Home Concentrator;E-Tanks    Sleep Oxygen Prescription Continuous    Liters per minute 3    Home Exercise Oxygen Prescription Continuous    Liters per minute 3    Home Resting Oxygen Prescription Continuous    Liters per minute 3    Compliance with Home Oxygen Use Yes      Goals/Expected Outcomes   Short Term Goals To learn and  exhibit compliance with exercise, home and travel O2 prescription;To learn and understand importance of monitoring SPO2 with pulse oximeter and demonstrate accurate use of the pulse oximeter.;To learn and demonstrate proper pursed lip breathing techniques or other breathing techniques. ;To learn and understand importance of maintaining oxygen saturations>88%    Long  Term Goals Verbalizes importance of monitoring SPO2 with pulse oximeter and return demonstration;Exhibits compliance with exercise, home  and travel O2 prescription;Maintenance of O2 saturations>88%;Exhibits proper breathing techniques,  such as pursed lip breathing or other method taught during program session    Comments [place] contnues doing well. She continues to watch her O2 and HR at home and reports normal values. She feels her breathing has improved, but will still have good days and bad days. She is practicing PLB and doesn't feel a difference, but notices her O2 goes up.    Goals/Expected Outcomes Short: Continue PLB when needed Long: Routine maintenance staying above 88% oxygen.             Initial Exercise Prescription:  Initial Exercise Prescription - 05/16/21 1600       Date of Initial Exercise RX and Referring Provider   Date 05/16/21    Referring Provider Patsey Berthold      Oxygen   Oxygen Continuous    Liters 3    Maintain Oxygen Saturation 88% or higher      Treadmill   MPH 1.5    Grade 0    Minutes 15    METs 2      Arm Ergometer   Level 1    RPM 25    Minutes 15    METs 2      Recumbant Elliptical   Level 1    RPM 50    Minutes 15    METs 2      REL-XR   Level 1    Speed 50    Minutes 15    METs 2      Track   Laps 20    Minutes 15    METs 2      Prescription Details   Frequency (times per week) 3    Duration Progress to 30 minutes of continuous aerobic without signs/symptoms of physical distress      Intensity   THRR 40-80% of Max Heartrate 110-135    Ratings of Perceived  Exertion 11-15    Perceived Dyspnea 0-4      Resistance Training   Training Prescription Yes    Weight 3 lb    Reps 10-15             Perform Capillary Blood Glucose checks as needed.  Exercise Prescription Changes:   Exercise Prescription Changes     Row Name 05/16/21 1600 06/03/21 1400 06/18/21 0900 07/02/21 1400 07/15/21 1500     Response to Exercise   Blood Pressure (Admit) 110/58 116/60 102/62 134/70 110/60   Blood Pressure (Exercise) 114/58 138/70 124/60 -- --   Blood Pressure (Exit) 114/64 98/60 118/64 104/52 98/56   Heart Rate (Admit) 85 bpm 74 bpm 75 bpm 87 bpm 84 bpm   Heart Rate (Exercise) 92 bpm 78 bpm 85 bpm 103 bpm 79 bpm   Heart Rate (Exit) 90 bpm 66 bpm 88 bpm 95 bpm 75 bpm   Oxygen Saturation (Admit) 93 % 95 % 98 % 92 % 89 %   Oxygen Saturation (Exercise) 87 % 90 % 88 % 88 % 88 %   Oxygen Saturation (Exit) 95 % 94 % 91 % 94 % 90 %   Rating of Perceived Exertion (Exercise) _0 Perceived Dyspnea (Exercise) _1 Symptoms SOB -- SOB SOB SOB   Duration -- Continue with 30 min of aerobic exercise without signs/symptoms of physical distress. Continue with 30 min of aerobic exercise without signs/symptoms of physical distress. Continue with 30 min of aerobic exercise without signs/symptoms of physical distress. Continue with 30 min  of aerobic exercise without signs/symptoms of physical distress.   Intensity -- THRR unchanged THRR unchanged THRR unchanged THRR unchanged     Progression   Progression -- Continue to progress workloads to maintain intensity without signs/symptoms of physical distress. Continue to progress workloads to maintain intensity without signs/symptoms of physical distress. Continue to progress workloads to maintain intensity without signs/symptoms of physical distress. Continue to progress workloads to maintain intensity without signs/symptoms of physical distress.   Average METs -- 3.15 2.69 3.03 2.11     Resistance Training    Training Prescription -- Yes Yes Yes Yes   Weight -- 3 lb 3 lb 3 lb 4 lb   Reps -- 10-15 10-15 10-15 10-15     Interval Training   Interval Training -- -- No No No     Oxygen   Oxygen -- Continuous Continuous Continuous Continuous   Liters -- _0 Treadmill   MPH -- -- 1.9 1.6 1.6   Grade -- -- 0 0 0   Minutes -- -- _1 METs -- -- 2.45 2.23 2.23     Recumbant Bike   Level -- -- 1 -- --   Watts -- -- 16 -- --   Minutes -- -- 15 -- --   METs -- -- 2.59 -- --     NuStep   Level -- -- _2 Minutes -- -- _3 METs -- -- 3.1 3.42 2     REL-XR   Level -- _4 --   Minutes -- _5 --   METs -- 3.7 -- -- --     Track   Laps -- 30 -- 22 --   Minutes -- 15 -- 15 --   METs -- 2.63 -- 2.2 --     Home Exercise Plan   Plans to continue exercise at -- -- Home (comment)  walking and staff Youtube videos Home (comment)  walking and staff Youtube videos Home (comment)  walking and staff Youtube videos   Frequency -- -- Add 2 additional days to program exercise sessions.  Start with 1 Add 2 additional days to program exercise sessions.  Start with 1 Add 2 additional days to program exercise sessions.  Start with 1   Initial Home Exercises Provided -- -- 06/18/21 06/18/21 06/18/21     Oxygen   Maintain Oxygen Saturation -- 88% or higher 88% or higher 88% or higher 88% or higher    Row Name 07/29/21 0900 08/12/21 1300 08/26/21 0700         Response to Exercise   Blood Pressure (Admit) 100/60 102/58 126/64     Blood Pressure (Exit) 98/60 110/66 100/56     Heart Rate (Admit) 62 bpm 75 bpm 80 bpm     Heart Rate (Exercise) 90 bpm 97 bpm 102 bpm     Heart Rate (Exit) 72 bpm 76 bpm 84 bpm     Oxygen Saturation (Admit) 91 % 96 % 90 %     Oxygen Saturation (Exercise) 88 % 88 % 88 %     Oxygen Saturation (Exit) 91 % 92 % 94 %     Rating of Perceived Exertion (Exercise) _6 Perceived Dyspnea (Exercise) _7 Symptoms SOB SOB SOB     Duration  Continue with 30 min of aerobic exercise without signs/symptoms of physical  distress. Continue with 30 min of aerobic exercise without signs/symptoms of physical distress. Continue with 30 min of aerobic exercise without signs/symptoms of physical distress.     Intensity THRR unchanged THRR unchanged THRR unchanged       Progression   Progression Continue to progress workloads to maintain intensity without signs/symptoms of physical distress. Continue to progress workloads to maintain intensity without signs/symptoms of physical distress. Continue to progress workloads to maintain intensity without signs/symptoms of physical distress.     Average METs 2.14 2.19 2.37       Resistance Training   Training Prescription Yes Yes Yes     Weight 3 lb 3 lb 3 lb     Reps 10-15 10-15 10-15       Interval Training   Interval Training Yes No No       Oxygen   Oxygen Continuous Continuous Continuous     Liters _0 Treadmill   MPH -- 1.9 2     Grade -- 0 0     Minutes -- 15 15     METs -- 2.45 2.53       Recumbant Bike   Level 2 -- --     Minutes 15 -- --     METs 2.89 -- --       NuStep   Level _1 Minutes _2 METs 2.2 2.9 2.5       Arm Ergometer   Level _3 Minutes _4 METs 1 -- 1.9       REL-XR   Level _5 Minutes _6 METs 1.8 2 --       Track   Laps -- 12  12 laps in hallway = 10 laps on track --     Minutes -- 15 --     METs -- 1.54 --       Home Exercise Plan   Plans to continue exercise at Home (comment)  walking and staff Youtube videos Home (comment)  walking and staff Youtube videos Home (comment)  walking and staff Youtube videos     Frequency Add 2 additional days to program exercise sessions.  Start with 1 Add 2 additional days to program exercise sessions.  Start with 1 Add 2 additional days to program exercise sessions.  Start with 1     Initial Home Exercises Provided 06/18/21 06/18/21 06/18/21       Oxygen    Maintain Oxygen Saturation 88% or higher 88% or higher 88% or higher              Exercise Comments:   Exercise Comments     Row Name 05/21/21 1245 06/18/21 0939         Exercise Comments First full day of exercise!  Patient was oriented to gym and equipment including functions, settings, policies, and procedures.  Patient's individual exercise prescription and treatment plan were reviewed.  All starting workloads were established based on the results of the 6 minute walk test done at initial orientation visit.  The plan for exercise progression was also introduced and progression will be customized based on patient's performance and goals. Reviewed home exercise with pt today.  Pt plans to walk and do staff Youtube videos for exercise.  Reviewed THR, pulse,  RPE, sign and symptoms, pulse oximetery and when to call 911 or MD.  Also discussed weather considerations and indoor options.  Pt voiced understanding.               Exercise Goals and Review:   Exercise Goals     Row Name 05/16/21 1645             Exercise Goals   Increase Physical Activity Yes       Intervention Provide advice, education, support and counseling about physical activity/exercise needs.;Develop an individualized exercise prescription for aerobic and resistive training based on initial evaluation findings, risk stratification, comorbidities and participant's personal goals.       Expected Outcomes Short Term: Attend rehab on a regular basis to increase amount of physical activity.;Long Term: Add in home exercise to make exercise part of routine and to increase amount of physical activity.;Long Term: Exercising regularly at least 3-5 days a week.       Increase Strength and Stamina Yes       Intervention Provide advice, education, support and counseling about physical activity/exercise needs.;Develop an individualized exercise prescription for aerobic and resistive training based on initial evaluation  findings, risk stratification, comorbidities and participant's personal goals.       Expected Outcomes Short Term: Increase workloads from initial exercise prescription for resistance, speed, and METs.;Short Term: Perform resistance training exercises routinely during rehab and add in resistance training at home;Long Term: Improve cardiorespiratory fitness, muscular endurance and strength as measured by increased METs and functional capacity (6MWT)       Able to understand and use rate of perceived exertion (RPE) scale Yes       Intervention Provide education and explanation on how to use RPE scale       Expected Outcomes Short Term: Able to use RPE daily in rehab to express subjective intensity level;Long Term:  Able to use RPE to guide intensity level when exercising independently       Able to understand and use Dyspnea scale Yes       Intervention Provide education and explanation on how to use Dyspnea scale       Expected Outcomes Short Term: Able to use Dyspnea scale daily in rehab to express subjective sense of shortness of breath during exertion;Long Term: Able to use Dyspnea scale to guide intensity level when exercising independently       Knowledge and understanding of Target Heart Rate Range (THRR) Yes       Intervention Provide education and explanation of THRR including how the numbers were predicted and where they are located for reference       Expected Outcomes Short Term: Able to state/look up THRR;Short Term: Able to use daily as guideline for intensity in rehab;Long Term: Able to use THRR to govern intensity when exercising independently       Able to check pulse independently Yes       Intervention Provide education and demonstration on how to check pulse in carotid and radial arteries.;Review the importance of being able to check your own pulse for safety during independent exercise       Expected Outcomes Short Term: Able to explain why pulse checking is important during  independent exercise;Long Term: Able to check pulse independently and accurately       Understanding of Exercise Prescription Yes       Intervention Provide education, explanation, and written materials on patient's individual exercise prescription       Expected  Outcomes Short Term: Able to explain program exercise prescription;Long Term: Able to explain home exercise prescription to exercise independently                Exercise Goals Re-Evaluation :  Exercise Goals Re-Evaluation     Row Name 05/21/21 7121 06/03/21 1419 06/18/21 1343 07/02/21 1506 07/02/21 1509     Exercise Goal Re-Evaluation   Exercise Goals Review Increase Physical Activity;Able to understand and use rate of perceived exertion (RPE) scale;Knowledge and understanding of Target Heart Rate Range (THRR);Understanding of Exercise Prescription;Increase Strength and Stamina;Able to check pulse independently;Able to understand and use Dyspnea scale Increase Physical Activity;Increase Strength and Stamina Increase Physical Activity;Increase Strength and Stamina;Understanding of Exercise Prescription Increase Physical Activity;Increase Strength and Stamina;Understanding of Exercise Prescription Increase Physical Activity;Increase Strength and Stamina;Understanding of Exercise Prescription   Comments Reviewed RPE and dyspnea scales, THR and program prescription with pt today.  Pt voiced understanding and was given a copy of goals to take home. Heer is tolerating exercise well so far.  Oxygen has stayed in the 90s for most sessions.  She has done 30 laps on the track and has increased to 1.7 mph on TM.  We will continue to monitor progress. Reviewed home exercise with pt today.  Pt plans to walk and do staff Youtube videos for exercise. She is walking about 1/2 mile at a time at home. She checks her oxygen but has not been checking her HR.  Reviewed THR, pulse, RPE, sign and symptoms, pulse oximetery and when to call 911 or MD.  Also  discussed weather considerations and indoor options.  Pt voiced understanding. Kyri is doing well in rehab. She has increased her average METs to [facility] is doing well in rehab. She has increased her average METs to 3.03. She improved to level 2 on the T4NS machine. Patient would benefit from increasing resistance to 4 lbs. We will continue to moniter her progress in rehab.   Expected Outcomes Short: Use RPE daily to regulate intensity. Long: Follow program prescription in THR. Short:  maintain consistent attendance Long:  improve overall stamina Short: Start checking HR during exercise using pulse ox  Long: Continue independence exercise with appropriate prescription -- Short: Increase resistance to 4 lbs.. Long: Progress to hit THR during exercise.    [place] Name 07/15/21 1549 07/25/21 0955 07/29/21 0923 08/12/21 1358 08/22/21 0930     Exercise Goal Re-Evaluation   Exercise Goals Review Increase Physical Activity;Increase Strength and Stamina;Understanding of Exercise Prescription Increase Physical Activity;Increase Strength and Stamina;Understanding of Exercise Prescription Increase Physical Activity;Increase Strength and Stamina;Understanding of Exercise Prescription Increase Physical Activity;Increase Strength and Stamina;Understanding of Exercise Prescription Increase Physical Activity;Increase Strength and Stamina;Understanding of Exercise Prescription   Comments Miyako has been out since 5/11 after a hospital admission.  She will seek clearance from her doctor prior to returning to the program. Samyukta was recently discharged from the hospital and has not done much exercise at home.  She is trying to get into the swing of things and is interested in joining the [facility]- a brochure was provided. She recently exercised with [employer] videos at home but says they're too strenous for her. We discussed using our staff Youtube videos and to do a light load to start since she hasn't been in a normal  exercise regimen. Her goal would be able to exercise with those videos down the road. She is checking her HR and O2. Tamiki is doing well in rehab. She is still getting back  into the swing of things since being in the hospital. She did, however, increase to level 3 on both the T4 Nustep and XR. She has not been walking as much as staff will continue to encourage patient to try again. Will continue to monitor. Kathlene is doing well in rehab and will be due for her post 6MWT soon and we hope to see improvement. She did start walking again and was able to complete 12 laps on the hallway track and 1.9 mph on the treadmill. She worked at level 1 on the T4 last time and should increase next session. She would also benefit from increasing to 4 lb hand weights. Will continue to monitor. Jaiya has been swimming in the pool while on vacation. She has thought about going to the [facility] for [facility], encouraged her start exercising routinely outside of rehab to begin developing those habits.   Expected Outcomes Short: Clearance to return to program Long: Continue to improve stamina Short: Start 1 day of light exercise at home  Long: Continue to exercise independently at home or at gym facility Short: Try walking again Long: Continue to increase overall MET level Short: Increase handweights and level on T4, improve on post 6MWT Long: Continue to build up overall strength and stamina Short: exercise at least 2x/week outside of rehab Long: Continue to build up overall strength and stamina    Row Name 08/26/21 0756             Exercise Goal Re-Evaluation   Exercise Goals Review Increase Physical Activity;Increase Strength and Stamina;Understanding of Exercise Prescription       Comments Amillia is doing well in rehab. She is back up to 2.37 average METs. She has also improved to 2 mph on the treadmill. She has tolerated 3 lb for resistance training as well. We will continue to monitor her progress in the program.        Expected Outcomes Short: add incline on treadmill. Long: Continue to increase overall MET levels.                Discharge Exercise Prescription (Final Exercise Prescription Changes):  Exercise Prescription Changes - 08/26/21 0700       Response to Exercise   Blood Pressure (Admit) 126/64    Blood Pressure (Exit) 100/56    Heart Rate (Admit) 80 bpm    Heart Rate (Exercise) 102 bpm    Heart Rate (Exit) 84 bpm    Oxygen Saturation (Admit) 90 %    Oxygen Saturation (Exercise) 88 %    Oxygen Saturation (Exit) 94 %    Rating of Perceived Exertion (Exercise) 15    Perceived Dyspnea (Exercise) 2    Symptoms SOB    Duration Continue with 30 min of aerobic exercise without signs/symptoms of physical distress.    Intensity THRR unchanged      Progression   Progression Continue to progress workloads to maintain intensity without signs/symptoms of physical distress.    Average METs 2.37      Resistance Training   Training Prescription Yes    Weight 3 lb    Reps 10-15      Interval Training   Interval Training No      Oxygen   Oxygen Continuous    Liters 3      Treadmill   MPH 2    Grade 0    Minutes 15    METs 2.53      NuStep   Level 1  Minutes 15    METs 2.5      Arm Ergometer   Level 1    Minutes 15    METs 1.9      REL-XR   Level 1    Minutes 15      Home Exercise Plan   Plans to continue exercise at Home (comment)   walking and staff Youtube videos   Frequency Add 2 additional days to program exercise sessions.   Start with 1   Initial Home Exercises Provided 06/18/21      Oxygen   Maintain Oxygen Saturation 88% or higher             Nutrition:  Target Goals: Understanding of nutrition guidelines, daily intake of sodium <1551m, cholesterol <2064m calories 30% from fat and 7% or less from saturated fats, daily to have 5 or more servings of fruits and vegetables.  Education: All About Nutrition: -Group instruction provided by verbal,  written material, interactive activities, discussions, models, and posters to present general guidelines for heart healthy nutrition including fat, fiber, MyPlate, the role of sodium in heart healthy nutrition, utilization of the nutrition label, and utilization of this knowledge for meal planning. Follow up email sent as well. Written material given at graduation. [facility]  Date 07/18/21  [facility]- Verbalizes Understanding       Biometrics:  Pre Biometrics - 05/16/21 1646       Pre Biometrics   Height 5' 8.75" (1.746 m)    Weight 199 lb 11.2 oz (90.6 kg)    BMI (Calculated) 29.71    Single Leg Stand 4.55 seconds             Post Biometrics - 09/03/21 0941        Post  Biometrics   Height 5' 8.75" (1.746 m)    Weight 191 lb 3.2 oz (86.7 kg)    BMI (Calculated) 28.45    Single Leg Stand 1.8 seconds             Nutrition Therapy Plan and Nutrition Goals:  Nutrition Therapy & Goals - 05/28/21 1133       Nutrition Therapy   Diet Heart healthy, low Na, pulmonary MNT    Drug/Food Interactions Statins/Certain Fruits    Protein (specify units) 100g   1.2xkg (COPD)   Fiber 25 grams    Whole Grain Foods 3 servings    Saturated Fats 16 max. grams    Fruits and Vegetables 8 servings/day    Sodium 2 grams      Personal Nutrition Goals   Nutrition Goal ST: try some suggestions (Suggested cutting up fruits and vegetables while sitting down, roasting vegetables and chicken, using cut up vegetables to have salads more often, utilizing microwave ready grains, and replacing high caloire dips with low fat greek yogurt and hummus) in addiiton to trying out pre-made meals. Honor hunger.  LT: limit saturated fat < 16g/day, limit sodium <2g/day, eat 8 fruit/vegetables per day, make heart healthy changes that work for her    Comments [age].o. F admitted to pulmonary rehab for shortness of  breath. PMHx includes CAD, HFpEF, COPD, GERD, osteoporosis, former smoker. Relevant medications includes lipitor, lasix, prozac, CA w/ vit D, omeprazole. PYP Score: 53. Vegetables & Fruits 6/12. Breads, Grains & Cereals 9/12. Red & Processed Meat 8/12. Poultry 022. Fish & Shellfish 0/4. Beans, Nuts & Seeds 3/4. Milk & Dairy  Foods 2/6. Toppings, Oils, Seasonings & Salt 6/20. Sweets, Snacks & Restaurant Food 9/14. Beverages 8/10.  B: [employer] (eggs with bacon - sometimes no bacon) and fruit L: Sandwiches (ham and cheese with whole grain bread), pretzels with 2 tbsp of pimento cheese, or salad. S: chicken salad and dips with chips. She has done weight watchers in the past and maintained weight for 3 years, since reduing activity from shortness of breath she has gained weight. Discussed honoring hunger and limiting excess calories. Discussed heart healthy eating and pulmonary MNT - will need to review again as time was limited. She would like to get meals she could put together like [employer] nutrition - advised avoiding calorie smart as they can be ~350kcal and keto as that would limit CHO and could also be lower in calories. Discussed some limitations with pre-made meals like less control, higher salt, and less whole grains. Suggested cutting up fruits and vegetables while sitting down, roasting vegetables and chicken, using cut up vegetables to have salads more often, utilizing microwave ready grains, and replacing high caloire dips with low fat greek yogurt and hummus.      Intervention Plan   Intervention Prescribe, educate and counsel regarding individualized specific dietary modifications aiming towards targeted core components such as weight, hypertension, lipid management, diabetes, heart failure and other comorbidities.;Nutrition handout(s) given to patient.    Expected Outcomes Short Term Goal: Understand basic principles of dietary content, such as calories, fat, sodium, cholesterol and nutrients.;Short  Term Goal: A plan has been developed with personal nutrition goals set during dietitian appointment.;Long Term Goal: Adherence to prescribed nutrition plan.             Nutrition Assessments:  MEDIFICTS Score Key: ?70 Need to make dietary changes  40-70 Heart Healthy Diet ? 40 Therapeutic Level Cholesterol Diet  Flowsheet Row Pulmonary Rehab from 05/16/2021 in [facility] Cardiac and Pulmonary Rehab  Picture Your Plate Total Score on Admission 53      Picture Your Plate Scores: <70 Unhealthy dietary pattern with much room for improvement. 41-50 Dietary pattern unlikely to meet recommendations for good health and room for improvement. 51-60 More healthful dietary pattern, with some room for improvement.  >60 Healthy dietary pattern, although there may be some specific behaviors that could be improved.   Nutrition Goals Re-Evaluation:  Nutrition Goals Re-Evaluation     Row Name 06/18/21 0957 07/25/21 0948 08/22/21 0925         Goals   Nutrition Goal ST: try some suggestions (Suggested cutting up fruits and vegetables while sitting down, roasting vegetables and chicken, using cut up vegetables to have salads more often, utilizing microwave ready grains, and replacing high caloire dips with low fat greek yogurt and hummus) in addiiton to trying out pre-made meals. Honor hunger.  LT: limit saturated fat < 16g/day, limit sodium <2g/day, eat 8 fruit/vegetables per day, make heart healthy changes that work for her ST: try some suggestions (Suggested cutting up fruits and vegetables while sitting down, roasting vegetables and chicken, using cut up vegetables to have salads more often, utilizing microwave ready grains, and replacing high caloire dips with low fat greek yogurt and hummus) in addiiton to trying out pre-made meals. Honor hunger.  LT: limit saturated fat < 16g/day, limit sodium <2g/day, eat 8 fruit/vegetables per day, make heart healthy changes that work for her ST: continue to practice  meal-pre, include heart healthy carbohydrate source like sweet potatos, beans, and lentils, whole grains . Honor hunger.  LT: limit saturated  fat < 16g/day, limit sodium <2g/day, eat 8 fruit/vegetables per day, make heart healthy changes that work for her     Comment Darien has changed on her portion sizes by limiting their size during her meals. She is trying out new frozen dinners and made her aware to be conscious of the sodium on the label. We talked about looking at [place] (consulted with the RD who is OK with recommendation, just noted to be mindful of how many carbs are in the meal if they are low, to pair her meal with an additional carb to meet her caloric needs). She has eliminated dips from her diet but has not tried any other alternatives. She admits it's hard for her to cut down on a couple glasses of wine which is one of her goals she's trying to meet. She is going to start measuring her ounces poured and keep track by slowly reducing her intake. She hasn't tried working on any other goals at this time and declined other questions. Antwan admits she is not cooking as much as she used to. We talked about meal prep and pre-cutting vegetables. She tends to snack and eat smaller meals. She is limiting her portion size. She also cut down her wine intake from every night to only 3 times/ week which is a big improvement for her. She states she hasn't lost weight but feel like her clothes fit different. Sofiah reports reports doing well with her nutrition. She has been trying to cut back back portion sizes and has dropped some weight. Encouraged her to honor her hunger. She has cut out bread - encouraged her that carbohydrates provide energy and a lot of foods rich in carbohydrates are healthy like sweet potatos, beans, and lentils, whole grains. She has tried to prep food as well - she does not doing a lot of cooking right now due to her vacations, but she does cook chicken, she is  limiting her red meat (1-2x/week), a lot more salads with yogurt dressing, drinking more water, cutting out diet pepsi. She does not like water as much even with flavorings, but she is drinking it.     Expected Outcome Short: Continue to follow goals established by RD Long: Continue to focus on a friendly pulmonary-based diet Short: Continue to follow goals established by RD Long: Continue to focus on a friendly pulmonary-based diet ST: continue to practice meal-pre, include heart healthy carbohydrate source like sweet potatos, beans, and lentils, whole grains . Honor hunger.  LT: limit saturated fat < 16g/day, limit sodium <2g/day, eat 8 fruit/vegetables per day, make heart healthy changes that work for her              Nutrition Goals Discharge (Final Nutrition Goals Re-Evaluation):  Nutrition Goals Re-Evaluation - 08/22/21 0925       Goals   Nutrition Goal ST: continue to practice meal-pre, include heart healthy carbohydrate source like sweet potatos, beans, and lentils, whole grains . Honor hunger.  LT: limit saturated fat < 16g/day, limit sodium <2g/day, eat 8 fruit/vegetables per day, make heart healthy changes that work for her    Comment Lilli reports reports doing well with her nutrition. She has been trying to cut back back portion sizes and has dropped some weight. Encouraged her to honor her hunger. She has cut out bread - encouraged her that carbohydrates provide energy and a lot of foods rich in carbohydrates are healthy like sweet potatos, beans, and lentils, whole grains. She  has tried to prep food as well - she does not doing a lot of cooking right now due to her vacations, but she does cook chicken, she is limiting her red meat (1-2x/week), a lot more salads with yogurt dressing, drinking more water, cutting out diet pepsi. She does not like water as much even with flavorings, but she is drinking it.    Expected Outcome ST: continue to practice meal-pre, include heart healthy  carbohydrate source like sweet potatos, beans, and lentils, whole grains . Honor hunger.  LT: limit saturated fat < 16g/day, limit sodium <2g/day, eat 8 fruit/vegetables per day, make heart healthy changes that work for her             Psychosocial: Target Goals: Acknowledge presence or absence of significant depression and/or stress, maximize coping skills, provide positive support system. Participant is able to verbalize types and ability to use techniques and skills needed for reducing stress and depression.   Education: Stress, Anxiety, and Depression - Group verbal and visual presentation to define topics covered.  Reviews how body is impacted by stress, anxiety, and depression.  Also discusses healthy ways to reduce stress and to treat/manage anxiety and depression.  Written material given at graduation. Flowsheet Row Pulmonary Rehab from 08/01/2021 in [facility] Card[facility] and Pulmonary Rehab  Date 06/13/21  Educator [facility]  Instruction Review Code 1- [employer] Understanding       Education: Sleep Hygiene -Provides group verbal and written instruction about how sleep can affect your health.  Define sleep hygiene, discuss sleep cycles and impact of sleep habits. Review good sleep hygiene tips.    Initial Review & Psychosocial Screening:  Initial Psych Review & Screening - 05/08/21 1413       Initial Review   Source of Stress Concerns Unable to participate in former interests or hobbies;Unable to perform yard/household activities      [place]? Yes   family, church     Barriers   Psychosocial barriers to participate in program There are no identifiable barriers or psychosocial needs.;The patient should benefit from training in stress management and relaxation.      Screening Interventions   Interventions Encouraged to exercise;To provide support and resources with identified psychosocial needs    Expected Outcomes Short Term goal: Utilizing psychosocial  counselor, staff and physician to assist with identification of specific Stressors or current issues interfering with healing process. Setting desired goal for each stressor or current issue identified.;Long Term Goal: Stressors or current issues are controlled or eliminated.;Short Term goal: Identification and review with participant of any Quality of Life or Depression concerns found by scoring the questionnaire.;Long Term goal: The participant improves quality of Life and PHQ9 Scores as seen by post scores and/or verbalization of changes             Quality of Life Scores:  Scores of 19 and below usually indicate a poorer quality of life in these areas.  A difference of  2-3 points is a clinically meaningful difference.  A difference of 2-3 points in the total score of the Quality of Life Index has been associated with significant improvement in overall quality of life, self-image, physical symptoms, and general health in studies assessing change in quality of life.  PHQ-9: Review Flowsheet  More data exists      07/29/2021 05/16/2021 03/11/2021 03/09/2020 12/28/2018  Depression screen PHQ 2/9  Decreased Interest 0 1 0 0 0  Down, Depressed, Hopeless 0 1 0 0  0  PHQ - 2 Score 0 2 0 0 0  Altered sleeping - 0 - 0 0  Tired, decreased energy - 3 - 0 0  Change in appetite - 3 - 0 0  Feeling bad or failure about yourself  - 1 - 0 0  Trouble concentrating - 0 - 0 0  Moving slowly or fidgety/restless - 2 - 0 0  Suicidal thoughts - 0 - 0 0  PHQ-9 Score - 11 - 0 0  Difficult doing work/chores - Somewhat difficult - Not difficult at all Not difficult at all   Interpretation of Total Score  Total Score Depression Severity:  1-4 = Minimal depression, 5-9 = Mild depression, 10-14 = Moderate depression, 15-19 = Moderately severe depression, 20-27 = Severe depression   Psychosocial Evaluation and Intervention:  Psychosocial Evaluation - 05/08/21 1418       Psychosocial Evaluation & Interventions    Interventions Encouraged to exercise with the program and follow exercise prescription    Comments Karley is coming to pulmonary rehab after worsening shortness of breath. She has a pacemaker with associated heart history, back issues, orthostatic hypotension history, and other medical issues. She used to be quite active but her breathing has prevented her from continuing to do so. She really wants to get back into exercising and hopefully lose some weight that she has gained back recenlty. She has a great support system and is very active in her granddaughters' lives after her daughter passed away from cancer 5 years ago. She helps with getting them to school/sports and being supportive which is a huge motivator for her to work on her health    Expected Outcomes Short: attend pulmonary rehab for education and exercise. Long: develop and maintain positive self care habits.    Continue Psychosocial Services  Follow up required by staff             Psychosocial Re-Evaluation:  Psychosocial Re-Evaluation     [place] Name 06/18/21 1002 07/25/21 0951 08/22/21 0922         Psychosocial Re-Evaluation   Current issues with History of Depression History of Depression History of Depression     Comments Tenesia is doing well overall mentally. Her daughter passed away 5 years ago and has been taking medications for her depression since and feels like she is managing it well. She admits she has harder days emotionally accepting that she is gone. She takes Ambien to help her sleep. She is not interested in pursuing anything else at this time. Husband is good support and she really enjoys hanging out with her grandchildren.  She is enjoying the program and feels stronger. Tyechia is doing well mentally. Her daughter's passing is always a barrier for her at times but learns to manage with good and bad days. She is heading to the beach for 3 weeks in a couple of weeks with her familywhich she is excited about. Her  birthday is tomorrow and she has been celebrating all week. She tries to keep herself occupied with family and friends. She is truly enjoying the program and feels it is helping with her breathing. Soma recently went on vacation with her family and enjoyed herself. She is leaving Sunday to go back to the beach - she loves the salt air and feels like it helps her mentally. She reports no stress at this time. She relies on her husband for support. She has no issues sleeping at this time.     Expected Outcomes  Short: Continue routine attendance with rehab Long: Continue to maintain positive attitude Short: Enjoy beach time with family Long: Continue to utilize exercise for stress management Short: Enjoy beach time with family Long: Continue to utilize exercise for stress management     Interventions Encouraged to attend Pulmonary Rehabilitation for the exercise Encouraged to attend Pulmonary Rehabilitation for the exercise Encouraged to attend Pulmonary Rehabilitation for the exercise     Continue Psychosocial Services  Follow up required by staff Follow up required by staff Follow up required by staff              Psychosocial Discharge (Final Psychosocial Re-Evaluation):  Psychosocial Re-Evaluation - 08/22/21 1478       Psychosocial Re-Evaluation   Current issues with History of Depression    Comments Jaliya recently went on vacation with her family and enjoyed herself. She is leaving Sunday to go back to the beach - she loves the salt air and feels like it helps her mentally. She reports no stress at this time. She relies on her husband for support. She has no issues sleeping at this time.    Expected Outcomes Short: Enjoy beach time with family Long: Continue to utilize exercise for stress management    Interventions Encouraged to attend Pulmonary Rehabilitation for the exercise    Continue Psychosocial Services  Follow up required by staff             Education: Education Goals:  Education classes will be provided on a weekly basis, covering required topics. Participant will state understanding/return demonstration of topics presented.  Learning Barriers/Preferences:  Learning Barriers/Preferences - 05/08/21 1421       Learning Barriers/Preferences   Learning Barriers None    Learning Preferences None             General Pulmonary Education Topics:  Infection Prevention: - Provides verbal and written material to individual with discussion of infection control including proper hand washing and proper equipment cleaning during exercise session. Flowsheet Row Pulmonary Rehab from 08/01/2021 in [facility] Pul[facility] Re[facility]  Date 05/16/21  Educator AS  Instruction Review Code 1- Verbalizes Understanding       Falls Prevention: - Provides verbal and written material to individual with discussion of falls prevention and safety. Flowsheet Row Pulmonary Rehab from 08/01/2021 in [facility] and Pulmonary Rehab  Date 05/16/21  Educator AS  Instruction Review Code 1- Verbalizes Understanding       Chronic Lung Disease Review: - Group verbal instruction with posters, models, PowerPoint presentations and videos,  to review new updates, new respiratory medications, new advancements in procedures and treatments. Providing information on websites and "800" numbers for continued self-education. Includes information about supplement oxygen, available portable oxygen systems, continuous and intermittent flow rates, oxygen safety, concentrators, and Medicare reimbursement for oxygen. Explanation of Pulmonary Drugs, including class, frequency, complications, importance of spacers, rinsing mouth after steroid MDI's, and proper cleaning methods for nebulizers. Review of basic lung anatomy and physiology related to function, structure, and complications of lung disease. Review of risk factors. Discussion about methods for diagnosing sleep apnea and types of masks and machines  for OSA. Includes a review of the use of types of environmental controls: home humidity, furnaces, filters, dust mite/pet prevention, HEPA vacuums. Discussion about weather changes, air quality and the benefits of nasal washing. Instruction on Warning signs, infection symptoms, calling MD promptly, preventive modes, and value of vaccinations. Review of effective airway clearance, coughing and/or vibration techniques. Emphasizing that all should  Create an Sports administrator. Written material given at graduation. Flowsheet Row Pulmonary Rehab from 08/01/2021 in [facility] and Pulmonary Rehab  Education need identified 05/16/21  Date 06/06/21  Educator [facility]  Instruction Review Code 1- Verbalizes Understanding       AED/CPR: - Group verbal and written instruction with the use of models to demonstrate the basic use of the AED with the basic ABC's of resuscitation.    Anatomy and Cardiac Procedures: - Group verbal and visual presentation and models provide information about basic cardiac anatomy and function. Reviews the testing methods done to diagnose heart disease and the outcomes of the test results. Describes the treatment choices: Medical Management, Angioplasty, or Coronary Bypass Surgery for treating various heart conditions including Myocardial Infarction, Angina, Valve Disease, and Cardiac Arrhythmias.  Written material given at graduation. Flowsheet Row Pulmonary Rehab from 08/01/2021 in [facility] and Pulmonary Rehab  Date 07/04/21  Educator [facility]  Instruction Review Code 1- Verbalizes Understanding       Medication Safety: - Group verbal and visual instruction to review commonly prescribed medications for heart and lung disease. Reviews the medication, class of the drug, and side effects. Includes the steps to properly store meds and maintain the prescription regimen.  Written material given at graduation. Flowsheet Row Pulmonary Rehab from 08/01/2021 in [facility] and Pulmonary Rehab  Date  05/23/21  Educator SB  Instruction Review Code 1- Verbalizes Understanding       Other: -Provides group and verbal instruction on various topics (see comments)   Knowledge Questionnaire Score:  Knowledge Questionnaire Score - 05/16/21 1648       Knowledge Questionnaire Score   Pre Score 14/18              Core Components/Risk Factors/Patient Goals at Admission:  Personal Goals and Risk Factors at Admission - 05/16/21 1649       Core Components/Risk Factors/Patient Goals on Admission    Weight Management Yes;Weight Loss    Intervention Weight Management: Develop a combined nutrition and exercise program designed to reach desired caloric intake, while maintaining appropriate intake of nutrient and fiber, sodium and fats, and appropriate energy expenditure required for the weight goal.;Weight Management: Provide education and appropriate resources to help participant work on and attain dietary goals.;Weight Management/Obesity: Establish reasonable short term and long term weight goals.    Expected Outcomes Short Term: Continue to assess and modify interventions until short term weight is achieved;Long Term: Adherence to nutrition and physical activity/exercise program aimed toward attainment of established weight goal;Weight Loss: Understanding of general recommendations for a balanced deficit meal plan, which promotes 1-2 lb weight loss per week and includes a negative energy balance of [phone] kcal/d;Understanding recommendations for meals to include 15-35% energy as protein, 25-35% energy from fat, 35-60% energy from carbohydrates, less than 248m of dietary cholesterol, 20-35 gm of total fiber daily;Understanding of distribution of calorie intake throughout the day with the consumption of 4-5 meals/snacks    Improve shortness of breath with ADL's Yes    Intervention Provide education, individualized exercise plan and daily activity instruction to help decrease symptoms of SOB with  activities of daily living.    Expected Outcomes Short Term: Improve cardiorespiratory fitness to achieve a reduction of symptoms when performing ADLs;Long Term: Be able to perform more ADLs without symptoms or delay the onset of symptoms    Increase knowledge of respiratory medications and ability to use respiratory devices properly  Yes    Intervention Provide education and demonstration as  needed of appropriate use of medications, inhalers, and oxygen therapy.    Expected Outcomes Short Term: Achieves understanding of medications use. Understands that oxygen is a medication prescribed by physician. Demonstrates appropriate use of inhaler and oxygen therapy.;Long Term: Maintain appropriate use of medications, inhalers, and oxygen therapy.    Lipids Yes    Intervention Provide education and support for participant on nutrition & aerobic/resistive exercise along with prescribed medications to achieve LDL <75m, HDL >434m    Expected Outcomes Short Term: Participant states understanding of desired cholesterol values and is compliant with medications prescribed. Participant is following exercise prescription and nutrition guidelines.;Long Term: Cholesterol controlled with medications as prescribed, with individualized exercise RX and with personalized nutrition plan. Value goals: LDL < 7052mHDL > 40 mg.             Education:Diabetes - Individual verbal and written instruction to review signs/symptoms of diabetes, desired ranges of glucose level fasting, after meals and with exercise. Acknowledge that pre and post exercise glucose checks will be done for 3 sessions at entry of program.   Know Your Numbers and Heart Failure: - Group verbal and visual instruction to discuss disease risk factors for cardiac and pulmonary disease and treatment options.  Reviews associated critical values for Overweight/Obesity, Hypertension, Cholesterol, and Diabetes.  Discusses basics of heart failure:  signs/symptoms and treatments.  Introduces Heart Failure Zone chart for action plan for heart failure.  Written material given at graduation. Flowsheet Row [facility]monary [facility]  Date 05/30/21  Educator SB  Instruction Review Code 1- Verbalizes Understanding       Core Components/Risk Factors/Patient Goals Review:   Goals and Risk Factor Review     Row Name 06/18/21 1004 07/25/21 0943 08/22/21 0932         Core Components/Risk Factors/Patient Goals Review   Personal Goals Review Weight Management/Obesity;Improve shortness of breath with ADL's;Lipids Weight Management/Obesity;Improve shortness of breath with ADL's;Lipids Weight Management/Obesity;Improve shortness of breath with ADL's;Lipids     Review Joaquina has been doing well overall- she feels stronger and feels she has more stamina but does not feel like her SOB has improved all that much.The pollen right now has been affecting her breathing when going outside and is trying to limit that as a trigger. She is motivated to stay active. She is staying compliant with taking all of her medications, including her respiratory ones. She has been wanting to lose weight but said her weight has been staying consistent. We talked about also focusing on how she feels & how her clothes fit as she could also be adding muscle mass. She is in the process of making dietary changes and limiting her wine intake. Not sure when her next lipid panel is due. Apphia is doing well. She feels her breathing  and strength has improved overall. Specifically, she states she can get out of the chair a lot easier than she used to. She feels her activites at home are getting easier, except vacuuming has always been a struggle. She is checking her weight consistently. Her doctor had switched her diuretic to prn due to low BP. She is not sure when to take it and will ask her doctor to clarify when to take it. She is aware to report  any abnormal weight sudden weight gain. She feels her clothes fit her better overall. She is monitoring BP at home as it runs low sometimes. She keeps a log. Riko is doing well.  She still feels her activites at home are getting easier, except vacuuming is still hard - she will stop and rest when her breathing gets worse. She continues checking her weight consistently, she feels she lost some weight and can feel a difference in the way her clothes are fitting. She is not consistently monitoring BP at home - encouraged her to check it routinely at home - her husband has a cuff. She continues to watch her O2 and HR at home and reports normal values. She feels her breathing has improved, but will still have good days and bad days. She is practicing PLB and doesn't feel a difference, but notices her O2 goes up. She is taking her medications as prescribed with no issues.     Expected Outcomes Short: Keep focusing on weight loss Long: Continue to manage lifestyle risk factors Short: Ask doctor about usage of diuretic Long: Continue to manage risk factors Short: check BP at home consistently Long: Continue to manage risk factors              Core Components/Risk Factors/Patient Goals at Discharge (Final Review):   Goals and Risk Factor Review - 08/22/21 0932       Core Components/Risk Factors/Patient Goals Review   Personal Goals Review Weight Management/Obesity;Improve shortness of breath with ADL's;Lipids    Review Tijah is doing well. She still feels her activites at home are getting easier, except vacuuming is still hard - she will stop and rest when her breathing gets worse. She continues checking her weight consistently, she feels she lost some weight and can feel a difference in the way her clothes are fitting. She is not consistently monitoring BP at home - encouraged her to check it routinely at home - her husband has a cuff. She continues to watch her O2 and HR at home and reports normal  values. She feels her breathing has improved, but will still have good days and bad days. She is practicing PLB and doesn't feel a difference, but notices her O2 goes up. She is taking her medications as prescribed with no issues.    Expected Outcomes Short: check BP at home consistently Long: Continue to manage risk factors             ITP Comments:  ITP Comments     Row Name 05/08/21 1422 05/16/21 1651 05/21/21 0923 05/28/21 1130 06/12/21 1318   ITP Comments Initial telephone orientation completed. Diagnosis can be found in [facility] 2/7. EP orientation scheduled for Thursday 3/23 at 3pm. Completed 6MWT and gym orientation. Initial ITP created and sent for review to Dr. Ottie Glazier, Medical Director. First full day of exercise!  Patient was oriented to gym and equipment including functions, settings, policies, and procedures.  Patient's individual exercise prescription and treatment plan were reviewed.  All starting workloads were established based on the results of the 6 minute walk test done at initial orientation visit.  The plan for exercise progression was also introduced and progression will be customized based on patient's performance and goals. Completed initial RD consultation 30 Day review completed. Medical Director ITP review done, changes made as directed, and signed approval by Medical Director.    [place] Name 07/09/21 1539 07/10/21 0833 07/15/21 1548 08/07/21 1152 09/04/21 0806   ITP Comments Patient called to let us know she will not be at rehab this week as she was in the ED yesterday for a UTI and dehydration. She will call us next week to let us know  how she is feeling. 30 Day review completed. Medical Director ITP review done, changes made as directed, and signed approval by Medical Director. Pt called today to update status.  She had an echo tomorrow and see her PCP on Wednesday.  She still was not feeling 100% but will seek release to return to rehab from her PCP. 30 Day review  completed. Medical Director ITP review done, changes made as directed, and signed approval by Medical Director. 30 Day review completed. Medical Director ITP review done, changes made as directed, and signed approval by Medical Director.            Comments:

## 2021-09-04 NOTE — ED Notes (Signed)
Pt transported to CT at this time.

## 2021-09-04 NOTE — Discharge Instructions (Addendum)
1. Negative for acute aortic dissection.  No aneurysm. 2. Moderate aortic atherosclerosis without high-grade stenosis or occlusive disease within the abdomen or pelvis. Small penetrating ulcer or ulcerated plaque at the distal descending thoracic aorta. 3. Emphysema. Redemonstrated bilateral pulmonary nodules including 9 mm spiculated right upper lobe pulmonary nodule. Consider one of the following in 3 months for both low-risk and high-risk individuals: (a) repeat chest CT, (b) follow-up PET-CT, or (c) tissue sampling. This recommendation follows the consensus statement: Guidelines for Management of Incidental Pulmonary Nodules Detected on CT Images: From the Fleischner Society 2017; Radiology 2017; 284:228-243. 4. Diffuse fluid-filled small and large bowel without obstruction or wall thickening, findings are suggestive of enteritis 5. 1.5 cm nodule in the left lobe of the thyroid. Recommend thyroid US (ref: J Am Coll Radiol. 2015 Feb;12(2): 143-50). This should be performed non emergently

## 2021-09-04 NOTE — ED Triage Notes (Signed)
Pt arrived pov alert and oriented. Pt states chest and back pain started last night and has increasingly gotten worse today. Pt has nausea, diarrhea  and vomiting since last night. Pt states the pain is in her right back and central chest not radiating.

## 2021-09-05 ENCOUNTER — Ambulatory Visit (INDEPENDENT_AMBULATORY_CARE_PROVIDER_SITE_OTHER): Payer: Medicare Other

## 2021-09-05 DIAGNOSIS — I480 Paroxysmal atrial fibrillation: Secondary | ICD-10-CM | POA: Diagnosis present

## 2021-09-05 DIAGNOSIS — Z923 Personal history of irradiation: Secondary | ICD-10-CM | POA: Diagnosis not present

## 2021-09-05 DIAGNOSIS — Z950 Presence of cardiac pacemaker: Secondary | ICD-10-CM | POA: Diagnosis not present

## 2021-09-05 DIAGNOSIS — Z79899 Other long term (current) drug therapy: Secondary | ICD-10-CM | POA: Diagnosis not present

## 2021-09-05 DIAGNOSIS — I495 Sick sinus syndrome: Secondary | ICD-10-CM | POA: Diagnosis present

## 2021-09-05 DIAGNOSIS — J438 Other emphysema: Secondary | ICD-10-CM | POA: Diagnosis present

## 2021-09-05 DIAGNOSIS — K219 Gastro-esophageal reflux disease without esophagitis: Secondary | ICD-10-CM

## 2021-09-05 DIAGNOSIS — N390 Urinary tract infection, site not specified: Secondary | ICD-10-CM | POA: Diagnosis not present

## 2021-09-05 DIAGNOSIS — Z853 Personal history of malignant neoplasm of breast: Secondary | ICD-10-CM | POA: Diagnosis not present

## 2021-09-05 DIAGNOSIS — E785 Hyperlipidemia, unspecified: Secondary | ICD-10-CM

## 2021-09-05 DIAGNOSIS — Z90710 Acquired absence of both cervix and uterus: Secondary | ICD-10-CM | POA: Diagnosis not present

## 2021-09-05 DIAGNOSIS — Z20822 Contact with and (suspected) exposure to covid-19: Secondary | ICD-10-CM | POA: Diagnosis present

## 2021-09-05 DIAGNOSIS — E876 Hypokalemia: Secondary | ICD-10-CM | POA: Diagnosis present

## 2021-09-05 DIAGNOSIS — Z7901 Long term (current) use of anticoagulants: Secondary | ICD-10-CM | POA: Diagnosis not present

## 2021-09-05 DIAGNOSIS — N3000 Acute cystitis without hematuria: Secondary | ICD-10-CM | POA: Diagnosis present

## 2021-09-05 DIAGNOSIS — Z87891 Personal history of nicotine dependence: Secondary | ICD-10-CM | POA: Diagnosis not present

## 2021-09-05 DIAGNOSIS — I442 Atrioventricular block, complete: Secondary | ICD-10-CM | POA: Diagnosis not present

## 2021-09-05 DIAGNOSIS — Z9981 Dependence on supplemental oxygen: Secondary | ICD-10-CM | POA: Diagnosis not present

## 2021-09-05 DIAGNOSIS — R0902 Hypoxemia: Secondary | ICD-10-CM | POA: Diagnosis not present

## 2021-09-05 DIAGNOSIS — A4150 Gram-negative sepsis, unspecified: Secondary | ICD-10-CM | POA: Diagnosis not present

## 2021-09-05 DIAGNOSIS — G40909 Epilepsy, unspecified, not intractable, without status epilepticus: Secondary | ICD-10-CM | POA: Diagnosis present

## 2021-09-05 DIAGNOSIS — A4151 Sepsis due to Escherichia coli [E. coli]: Secondary | ICD-10-CM | POA: Diagnosis present

## 2021-09-05 DIAGNOSIS — J9621 Acute and chronic respiratory failure with hypoxia: Secondary | ICD-10-CM | POA: Diagnosis not present

## 2021-09-05 DIAGNOSIS — I252 Old myocardial infarction: Secondary | ICD-10-CM | POA: Diagnosis not present

## 2021-09-05 DIAGNOSIS — F32A Depression, unspecified: Secondary | ICD-10-CM

## 2021-09-05 DIAGNOSIS — Z1611 Resistance to penicillins: Secondary | ICD-10-CM | POA: Diagnosis present

## 2021-09-05 LAB — CBC
HCT: 31.9 % — ABNORMAL LOW (ref 36.0–46.0)
Hemoglobin: 10.2 g/dL — ABNORMAL LOW (ref 12.0–15.0)
MCH: 29.5 pg (ref 26.0–34.0)
MCHC: 32 g/dL (ref 30.0–36.0)
MCV: 92.2 fL (ref 80.0–100.0)
Platelets: 191 10*3/uL (ref 150–400)
RBC: 3.46 MIL/uL — ABNORMAL LOW (ref 3.87–5.11)
RDW: 12.9 % (ref 11.5–15.5)
WBC: 8.5 10*3/uL (ref 4.0–10.5)
nRBC: 0 % (ref 0.0–0.2)

## 2021-09-05 LAB — BLOOD CULTURE ID PANEL (REFLEXED) - BCID2

## 2021-09-05 LAB — BLOOD GAS, ARTERIAL
Acid-base deficit: 5.7 mmol/L — ABNORMAL HIGH (ref 0.0–2.0)
Bicarbonate: 17.3 mmol/L — ABNORMAL LOW (ref 20.0–28.0)
FIO2: 45 %
O2 Saturation: 96.8 %
Patient temperature: 37
pCO2 arterial: 26 mmHg — ABNORMAL LOW (ref 32–48)
pH, Arterial: 7.43 (ref 7.35–7.45)
pO2, Arterial: 68 mmHg — ABNORMAL LOW (ref 83–108)

## 2021-09-05 LAB — TROPONIN I (HIGH SENSITIVITY): Troponin I (High Sensitivity): 15 ng/L (ref <18)

## 2021-09-05 LAB — BASIC METABOLIC PANEL
Anion gap: 5 (ref 5–15)
BUN: 18 mg/dL (ref 8–23)
CO2: 19 mmol/L — ABNORMAL LOW (ref 22–32)
Calcium: 7.5 mg/dL — ABNORMAL LOW (ref 8.9–10.3)
Chloride: 111 mmol/L (ref 98–111)
Creatinine, Ser: 0.79 mg/dL (ref 0.44–1.00)
GFR, Estimated: >60 mL/min (ref >60)
Glucose, Bld: 164 mg/dL — ABNORMAL HIGH (ref 70–99)
Potassium: 2.9 mmol/L — ABNORMAL LOW (ref 3.5–5.1)
Sodium: 135 mmol/L (ref 135–145)

## 2021-09-05 LAB — CORTISOL-AM, BLOOD: Cortisol - AM: 13.9 ug/dL (ref 6.7–22.6)

## 2021-09-05 LAB — PROTIME-INR
INR: 1.5 — ABNORMAL HIGH (ref 0.8–1.2)
Prothrombin Time: 17.6 seconds — ABNORMAL HIGH (ref 11.4–15.2)

## 2021-09-05 LAB — PROCALCITONIN: Procalcitonin: 1.71 ng/mL

## 2021-09-05 MED ORDER — ONDANSETRON HCL 4 MG/2ML IJ SOLN
4.0000 mg | Freq: Four times a day (QID) | INTRAMUSCULAR | Status: DC | PRN
  Administered 2021-09-05 (×2): 4 mg via INTRAVENOUS
  Filled 2021-09-05 (×2): qty 2

## 2021-09-05 MED ORDER — ALBUTEROL SULFATE HFA 108 (90 BASE) MCG/ACT IN AERS: 2.0000 | INHALATION_SPRAY | RESPIRATORY_TRACT | Status: DC | PRN

## 2021-09-05 MED ORDER — FLUOXETINE HCL 20 MG PO CAPS
20.0000 mg | ORAL_CAPSULE | Freq: Every day | ORAL | Status: DC
  Administered 2021-09-05 – 2021-09-09 (×5): 20 mg via ORAL
  Filled 2021-09-05 (×5): qty 1

## 2021-09-05 MED ORDER — POTASSIUM CHLORIDE CRYS ER 20 MEQ PO TBCR
40.0000 meq | EXTENDED_RELEASE_TABLET | Freq: Three times a day (TID) | ORAL | Status: AC
  Administered 2021-09-05 – 2021-09-06 (×4): 40 meq via ORAL
  Filled 2021-09-05 (×4): qty 2

## 2021-09-05 MED ORDER — BENZONATATE 100 MG PO CAPS
100.0000 mg | ORAL_CAPSULE | Freq: Three times a day (TID) | ORAL | Status: DC | PRN
  Administered 2021-09-08: 100 mg via ORAL

## 2021-09-05 MED ORDER — SODIUM CHLORIDE 0.9 % IV BOLUS
1000.0000 mL | Freq: Once | INTRAVENOUS | Status: AC
  Administered 2021-09-05: 1000 mL via INTRAVENOUS

## 2021-09-05 MED ORDER — SODIUM CHLORIDE 0.9 % IV SOLN
2.0000 g | INTRAVENOUS | Status: AC
  Administered 2021-09-05 – 2021-09-07 (×3): 2 g via INTRAVENOUS
  Filled 2021-09-05 (×3): qty 20

## 2021-09-05 MED ORDER — ATORVASTATIN CALCIUM 20 MG PO TABS
40.0000 mg | ORAL_TABLET | Freq: Every day | ORAL | Status: DC
  Administered 2021-09-05 – 2021-09-09 (×5): 40 mg via ORAL
  Filled 2021-09-05 (×5): qty 2

## 2021-09-05 MED ORDER — ACETAMINOPHEN 325 MG PO TABS
650.0000 mg | ORAL_TABLET | Freq: Four times a day (QID) | ORAL | Status: DC | PRN
  Administered 2021-09-05 – 2021-09-09 (×9): 650 mg via ORAL
  Filled 2021-09-05 (×11): qty 2

## 2021-09-05 MED ORDER — ALBUTEROL SULFATE (2.5 MG/3ML) 0.083% IN NEBU
2.5000 mg | INHALATION_SOLUTION | RESPIRATORY_TRACT | Status: DC | PRN
  Administered 2021-09-05: 2.5 mg via RESPIRATORY_TRACT
  Filled 2021-09-05: qty 3

## 2021-09-05 MED ORDER — VITAMIN D 25 MCG (1000 UNIT) PO TABS
1000.0000 [IU] | ORAL_TABLET | Freq: Every day | ORAL | Status: DC
  Administered 2021-09-05 – 2021-09-09 (×5): 1000 [IU] via ORAL
  Filled 2021-09-05 (×5): qty 1

## 2021-09-05 MED ORDER — DICLOFENAC SODIUM 1 % EX GEL
2.0000 g | Freq: Four times a day (QID) | CUTANEOUS | Status: DC
Start: 2021-09-05 — End: 2021-09-09
  Administered 2021-09-05 – 2021-09-09 (×14): 2 g via TOPICAL

## 2021-09-05 MED ORDER — ACETAMINOPHEN 650 MG RE SUPP: 650.0000 mg | Freq: Four times a day (QID) | RECTAL | Status: DC | PRN

## 2021-09-05 MED ORDER — HYDROMORPHONE HCL 1 MG/ML IJ SOLN
0.5000 mg | Freq: Four times a day (QID) | INTRAMUSCULAR | Status: DC | PRN
  Administered 2021-09-05 – 2021-09-06 (×3): 0.5 mg via INTRAVENOUS
  Filled 2021-09-05 (×3): qty 0.5

## 2021-09-05 MED ORDER — MORPHINE SULFATE (PF) 2 MG/ML IV SOLN: 2.0000 mg | INTRAVENOUS | Status: DC | PRN

## 2021-09-05 MED ORDER — ZOLPIDEM TARTRATE 5 MG PO TABS
5.0000 mg | ORAL_TABLET | Freq: Every evening | ORAL | Status: DC | PRN
  Administered 2021-09-05 – 2021-09-08 (×4): 5 mg via ORAL
  Filled 2021-09-05 (×4): qty 1

## 2021-09-05 MED ORDER — PANTOPRAZOLE SODIUM 40 MG PO TBEC
40.0000 mg | DELAYED_RELEASE_TABLET | Freq: Every day | ORAL | Status: DC
  Administered 2021-09-05 – 2021-09-09 (×5): 40 mg via ORAL
  Filled 2021-09-05 (×5): qty 1

## 2021-09-05 MED ORDER — LEVETIRACETAM 250 MG PO TABS
250.0000 mg | ORAL_TABLET | Freq: Two times a day (BID) | ORAL | Status: DC
  Administered 2021-09-05 – 2021-09-09 (×9): 250 mg via ORAL
  Filled 2021-09-05 (×9): qty 1

## 2021-09-05 MED ORDER — APIXABAN 5 MG PO TABS
5.0000 mg | ORAL_TABLET | Freq: Two times a day (BID) | ORAL | Status: DC
  Administered 2021-09-05 – 2021-09-09 (×9): 5 mg via ORAL
  Filled 2021-09-05 (×9): qty 1

## 2021-09-05 MED ORDER — FUROSEMIDE 40 MG PO TABS: 40.0000 mg | ORAL_TABLET | Freq: Every day | ORAL | Status: DC | PRN

## 2021-09-05 MED ORDER — MAGNESIUM HYDROXIDE 400 MG/5ML PO SUSP: 30.0000 mL | Freq: Every day | ORAL | Status: DC | PRN

## 2021-09-05 MED ORDER — TRAZODONE HCL 50 MG PO TABS
25.0000 mg | ORAL_TABLET | Freq: Every evening | ORAL | Status: DC | PRN
  Administered 2021-09-05 – 2021-09-08 (×4): 25 mg via ORAL
  Filled 2021-09-05 (×4): qty 1

## 2021-09-05 MED ORDER — ONDANSETRON HCL 4 MG PO TABS: 4.0000 mg | ORAL_TABLET | Freq: Four times a day (QID) | ORAL | Status: DC | PRN

## 2021-09-05 MED ORDER — SODIUM CHLORIDE 0.9 % IV SOLN: INTRAVENOUS | Status: DC

## 2021-09-05 MED ORDER — LORATADINE 10 MG PO TABS
10.0000 mg | ORAL_TABLET | Freq: Every day | ORAL | Status: DC
  Administered 2021-09-05 – 2021-09-09 (×5): 10 mg via ORAL
  Filled 2021-09-05 (×5): qty 1

## 2021-09-05 MED ORDER — DICLOFENAC SODIUM 1 % EX GEL
2.0000 g | Freq: Four times a day (QID) | CUTANEOUS | Status: DC
Start: 2021-09-05 — End: 2021-09-05
  Filled 2021-09-05: qty 100

## 2021-09-05 NOTE — Progress Notes (Signed)
PHARMACY - PHYSICIAN COMMUNICATION CRITICAL VALUE ALERT - BLOOD CULTURE IDENTIFICATION (BCID)  Diana Cook is an 71 y.o. female who presented to [facility] on 09/04/2021 with a chief complaint of chest and back pain, N/V/D with subjective fevers/chills   Assessment:  7/12 blodo cx with GNR, BCID detects E Coli from suspected urinary source  Name of physician (or Provider) Contacted: Dr Raelyn Mora  Current antibiotics: Ceftriaxone  Changes to prescribed antibiotics recommended:  Patient is on recommended antibiotics - No changes needed  Results for orders placed or performed during the hospital encounter of 09/04/21  Blood Culture ID Panel (Reflexed) (Collected: 09/04/2021 10:36 PM)  Result Value Ref Range   Enterococcus faecalis NOT DETECTED NOT DETECTED   Enterococcus Faecium NOT DETECTED NOT DETECTED   Listeria monocytogenes NOT DETECTED NOT DETECTED   Staphylococcus species NOT DETECTED NOT DETECTED   Staphylococcus aureus (BCID) NOT DETECTED NOT DETECTED   Staphylococcus epidermidis NOT DETECTED NOT DETECTED   Staphylococcus lugdunensis NOT DETECTED NOT DETECTED   Streptococcus species NOT DETECTED NOT DETECTED   Streptococcus agalactiae NOT DETECTED NOT DETECTED   Streptococcus pneumoniae NOT DETECTED NOT DETECTED   Streptococcus pyogenes NOT DETECTED NOT DETECTED   A.calcoaceticus-baumannii NOT DETECTED NOT DETECTED   Bacteroides fragilis NOT DETECTED NOT DETECTED   Enterobacterales DETECTED (A) NOT DETECTED   Enterobacter cloacae complex NOT DETECTED NOT DETECTED   Escherichia coli DETECTED (A) NOT DETECTED   Klebsiella aerogenes NOT DETECTED NOT DETECTED   Klebsiella oxytoca NOT DETECTED NOT DETECTED   Klebsiella pneumoniae NOT DETECTED NOT DETECTED   Proteus species NOT DETECTED NOT DETECTED   Salmonella species NOT DETECTED NOT DETECTED   Serratia marcescens NOT DETECTED NOT DETECTED   Haemophilus influenzae NOT DETECTED NOT DETECTED   Neisseria meningitidis NOT  DETECTED NOT DETECTED   Pseudomonas aeruginosa NOT DETECTED NOT DETECTED   Stenotrophomonas maltophilia NOT DETECTED NOT DETECTED   Candida albicans NOT DETECTED NOT DETECTED   Candida auris NOT DETECTED NOT DETECTED   Candida glabrata NOT DETECTED NOT DETECTED   Candida krusei NOT DETECTED NOT DETECTED   Candida parapsilosis NOT DETECTED NOT DETECTED   Candida tropicalis NOT DETECTED NOT DETECTED   Cryptococcus neoformans/gattii NOT DETECTED NOT DETECTED   CTX-M ESBL NOT DETECTED NOT DETECTED   Carbapenem resistance IMP NOT DETECTED NOT DETECTED   Carbapenem resistance KPC NOT DETECTED NOT DETECTED   Carbapenem resistance NDM NOT DETECTED NOT DETECTED   Carbapenem resist OXA 48 LIKE NOT DETECTED NOT DETECTED   Carbapenem resistance VIM NOT DETECTED NOT DETECTED   Doreene Eland, PharmD, BCPS, BCIDP Work Cell: [phone] 09/05/2021 10:13 AM

## 2021-09-05 NOTE — Assessment & Plan Note (Signed)
-   We will continue Prozac.

## 2021-09-05 NOTE — ED Notes (Signed)
MD [facility] messaged regarding pts increased WOB, low O2 sats, and back pain.  VO given for ABG and RT consult for poss BiPAP or appropriate intervention.

## 2021-09-05 NOTE — ED Notes (Signed)
Pt has large loose, watery stool, pt assisted to the toilet and back to the stretcher without difficulty.

## 2021-09-05 NOTE — Assessment & Plan Note (Signed)
-   The patient will be continued on Eliquis.

## 2021-09-05 NOTE — Assessment & Plan Note (Signed)
-   We will continue statin.

## 2021-09-05 NOTE — H&P (Signed)
Diana Cook   PATIENT NAME: Diana Cook    MR#:  [id number]  DATE OF BIRTH:  March 27, 1950  DATE OF ADMISSION:  09/04/2021  PRIMARY CARE PHYSICIAN: Tower, Wynelle Fanny, MD   Patient is coming from: Home  REQUESTING/REFERRING PHYSICIAN: Marjean Donna, MD  CHIEF COMPLAINT:   Chief Complaint  Patient presents with   Chest Pain    HISTORY OF PRESENT ILLNESS:  Diana Cook is a 71 y.o. Caucasian female with medical history significant for asthma, arthritis, COPD, paroxysmal atrial fibrillation, seizure disorder and coronary artery disease, who presented to the emergency room with acute onset of urinary frequency and urgency with associated subjective fever and chills as well as nausea and vomiting with diarrhea without bilious vomitus or hematemesis or melena or bright red bleeding per rectum.  She admitted to lowers abdominal and suprapubic pain.  She was having chest pain that felt like a stabbing pain last night ED Course: Upon arrival to the ER BP was 143/49 with central line upon monitor respiratory rate of 28 with pulse of 95 and pulse symmetry was 87% on 4 L of O2 by nasal cannula and later 94% on 4 L.  EKG as reviewed by me : EKG showed atrial sensed ventricular paced rhythm with a rate of 96. Imaging: Two-view chest x-ray showed mild left basilar atelectasis/scarring.  Chest CTA revealed the common 1. Negative for acute aortic dissection.  No aneurysm. 2. Moderate aortic atherosclerosis without high-grade stenosis or occlusive disease within the abdomen or pelvis. Small penetrating ulcer or ulcerated plaque at the distal descending thoracic aorta. 3. Emphysema. Redemonstrated bilateral pulmonary nodules including 9 mm spiculated right upper lobe pulmonary nodule. Consider one of the following in 3 months for both low-risk and high-risk individuals: (a) repeat chest CT, (b) follow-up PET-CT, or (c) tissue sampling. This recommendation follows the consensus statement: Guidelines  for Management of Incidental Pulmonary Nodules Detected on CT Images: From the Fleischner Society 2017; Radiology 2017; 284:228-243. 4. Diffuse fluid-filled small and large bowel without obstruction or wall thickening, findings are suggestive of enteritis 5. 1.5 cm nodule in the left lobe of the thyroid. Recommend thyroid US (ref: J Am Coll Radiol. 2015 Feb;12(2): 143-50). This should be performed non emergently  The patient was given a gram of IV Rocephin, 4 mg of IV Zofran and 1.5 L of IV normal saline.  She will be admitted to a medical telemetry bed for further evaluation and management.  PAST MEDICAL HISTORY:   Past Medical History:  Diagnosis Date   Allergic rhinitis    Arthritis    Asthma    as a child, mild now   Breast cancer ([place]) 12/2015   right breast cancer, lumpectomy and mammosite    Colon polyps    colonoscopy 7/08, tubular adenoma   Complete heart block ([place]) 09/2011   s/p PPM implanted in [facility]   COPD (chronic obstructive pulmonary disease) ([place])    Myocardial infarction ([place]) 2011   Osteopenia 10/2015   Pacemaker    2011   Paroxysmal atrial fibrillation ([place]) 03/2021   Incidentally detected on pacemaker interrogation   Personal history of radiation therapy 2017   right breast ca, mammosite placed   Seizure disorder ([place])    Seizures ([place])    first one was when she was 71 years old    Small bowel obstruction ([place])    1988 and 2002   Tobacco abuse     PAST SURGICAL HISTORY:  Past Surgical History:  Procedure Laterality Date   ABDOMINAL HYSTERECTOMY     BREAST BIOPSY Right 2007   benign inflammatory changes, mass due to underwire bra   BREAST BIOPSY Left 01/02/2016   columnar cell changes without atypical hyperplasia.   BREAST BIOPSY Right 12/06/2015   rt breast mass 10:00, bx done at Dr. Curly [place] office, invasive ductal carcinoma   BREAST EXCISIONAL BIOPSY Left 01/02/2016   COLUMNAR CELL CHANGE AND HYPERPLASIA ASSOCIATED WITH LUMINAL AND  STROMAL CALCIFICATIONS   BREAST LUMPECTOMY Right 01/02/2016   invasive mammary carcinoma, clear margins, negative LN   BREAST LUMPECTOMY WITH SENTINEL LYMPH NODE BIOPSY Right 01/02/2016   pT1c, N0; ER/ PR 100%; Her 2 neu not over expressed: BREAST LUMPECTOMY WITH SENTINEL LYMPH NODE BX;  Surgeon: [name], MD;  Location: [facility] ORS;  Service: General;  Laterality: Right;   CARDIAC CATHETERIZATION     CATARACT EXTRACTION Bilateral    COLONOSCOPY  10/2015   Dr Ardis Hughs   COLONOSCOPY WITH PROPOFOL N/A 08/19/2018   Procedure: COLONOSCOPY WITH PROPOFOL;  Surgeon: Virgel Manifold, MD;  Location: [facility] ENDOSCOPY;  Service: Endoscopy;  Laterality: N/A;   ESOPHAGOGASTRODUODENOSCOPY (EGD) WITH PROPOFOL N/A 08/19/2018   Procedure: ESOPHAGOGASTRODUODENOSCOPY (EGD) WITH PROPOFOL;  Surgeon: Virgel Manifold, MD;  Location: [facility] ENDOSCOPY;  Service: Endoscopy;  Laterality: N/A;   EXPLORATORY LAPAROTOMY  01/25/2001   Exploratory laparotomy, lysis of adhesions, identification of internal hernia secondary to omental adhesion. Prolonged postoperative ileus.   gyn surgery  1993   hysterectomy- form endometriosis   LAPAROSCOPY     PACEMAKER INSERTION  10/24/11   [employer] Advantio dual chamber PPM implanted by Dr Bunnie Philips at [facility] in [place] N/A 12/05/2019   Procedure: [place];  Surgeon: Deboraha Sprang, MD;  Location: [place] [facility];  Service: Cardiovascular;  Laterality: N/A;   RIGHT/LEFT HEART CATH AND CORONARY ANGIOGRAPHY Bilateral 07/19/2019   Procedure: RIGHT/LEFT HEART CATH AND CORONARY ANGIOGRAPHY;  Surgeon: Nelva Bush, MD;  Location: [place] [facility];  Service: Cardiovascular;  Laterality: Bilateral;   TEMPORARY PACEMAKER N/A 12/05/2019   Procedure: TEMPORARY PACEMAKER;  Surgeon: Deboraha Sprang, MD;  Location: [place] [facility];  Service: Cardiovascular;  Laterality: N/A;    SOCIAL HISTORY:   Social  History   Tobacco Use   Smoking status: Former    Packs/day: 1.50    Years: 42.00    Total pack years: 63.00    Types: Cigarettes    Quit date: 11/11/2010    Years since quitting: 10.8   Smokeless tobacco: Never  Substance Use Topics   Alcohol use: Yes    Alcohol/week: 2.0 standard drinks of alcohol    Types: 2 Glasses of wine per week    Comment: weekly    FAMILY HISTORY:   Family History  Problem Relation Age of Onset   Stroke Mother    Heart disease Mother 66       MI and CABG   Dementia Mother    Coronary artery disease Father    Parkinsonism Father    Cancer - Cervical Daughter 34       died 03/14/2022   Heart attack Brother 7   Colon cancer Neg Hx    Breast cancer Neg Hx     DRUG ALLERGIES:   Allergies  Allergen Reactions   Codeine Nausea And Vomiting   Morphine And Related Nausea Only    REVIEW OF SYSTEMS:  ROS As per history of present illness. All pertinent systems were reviewed above. Constitutional, HEENT, cardiovascular, respiratory, GI, GU, musculoskeletal, neuro, psychiatric, endocrine, integumentary and hematologic systems were reviewed and are otherwise negative/unremarkable except for positive findings mentioned above in the HPI.   MEDICATIONS AT HOME:   Prior to Admission medications   Medication Sig Start Date End Date Taking? Authorizing Provider  acetaminophen (TYLENOL) 500 MG tablet Take 500-1,000 mg by mouth every 6 (six) hours as needed for mild pain or headache.     [provider]  albuterol (VENTOLIN HFA) 108 (90 Base) MCG/ACT inhaler Inhale 2 puffs into the lungs every 4 (four) hours as needed for wheezing or shortness of breath. 03/21/20   Tower, Wynelle Fanny, MD  apixaban (ELIQUIS) 5 MG TABS tablet Take 1 tablet by mouth twice daily 06/10/21   End, Harrell Gave, MD  atorvastatin (LIPITOR) 40 MG tablet Take 1 tablet by mouth once daily 08/16/21   Deboraha Sprang, MD  benzonatate (TESSALON) 100 MG capsule Take by mouth as needed. 05/29/21    [provider]  FLUoxetine (PROZAC) 20 MG capsule Take 1 capsule by mouth once daily 08/16/21   Tower, Wynelle Fanny, MD  furosemide (LASIX) 40 MG tablet TAKE 1 TABLET BY MOUTH ONCE DAILY AS NEEDED 07/12/21   End, Harrell Gave, MD  levETIRAcetam (KEPPRA) 250 MG tablet Take 250 mg by mouth 2 (two) times daily.  11/02/19   [provider]  omeprazole (PRILOSEC) 40 MG capsule Take 40 mg by mouth daily.    [provider]  OXYGEN Inhale 3 L into the lungs at bedtime.     [provider]  STIOLTO RESPIMAT 2.5-2.5 MCG/ACT AERS INHALE 2 PUFFS BY MOUTH ONCE DAILY 09/04/21   Tyler Pita, MD  tretinoin (RETIN-A) 0.1 % cream Apply topically at bedtime. 03/21/20   Tower, Wynelle Fanny, MD  VITAMIN D PO Take 1 tablet by mouth daily.    [provider]  zolpidem (AMBIEN) 10 MG tablet TAKE 1/2 TO 1 (ONE-HALF TO ONE) TABLET BY MOUTH AT BEDTIME AS NEEDED FOR SLEEP 06/18/21   Tower, Wynelle Fanny, MD      VITAL SIGNS:  Blood pressure 120/60, pulse 99, temperature 99.9 F (37.7 C), temperature source Oral, resp. rate 15, height 5\' 8"  (1.727 m), weight 86.2 kg, SpO2 93 %.  PHYSICAL EXAMINATION:  Physical Exam  GENERAL:  71 y.o.-year-old patient lying in the bed with no acute distress.  EYES: Pupils equal, round, reactive to light and accommodation. No scleral icterus. Extraocular muscles intact.  HEENT: Head atraumatic, normocephalic. Oropharynx and nasopharynx clear.  NECK:  Supple, no jugular venous distention. No thyroid enlargement, no tenderness.  LUNGS: Normal breath sounds bilaterally, no wheezing, rales,rhonchi or crepitation. No use of accessory muscles of respiration.  CARDIOVASCULAR: Regular rate and rhythm, S1, S2 normal. No murmurs, rubs, or gallops.  ABDOMEN: Soft, nondistended, with mild suprapubic tenderness without rebound tenderness guarding or rigidity.  Bowel sounds present. No organomegaly or mass.  EXTREMITIES: No pedal edema, cyanosis, or clubbing.   NEUROLOGIC: Cranial nerves II through XII are intact. Muscle strength 5/5 in all extremities. Sensation intact. Gait not checked.  PSYCHIATRIC: The patient is alert and oriented x 3.  Normal affect and good eye contact. SKIN: No obvious rash, lesion, or ulcer.   LABORATORY PANEL:   CBC Recent Labs  Lab 09/05/21 0508  WBC 8.5  HGB 10.2*  HCT 31.9*  PLT 191   ------------------------------------------------------------------------------------------------------------------  Chemistries  Recent Labs  Lab  09/04/21 2107 09/05/21 0508  NA 134* 135  K 3.9 2.9*  CL 103 111  CO2 20* 19*  GLUCOSE 131* 164*  BUN 23 18  CREATININE 1.07* 0.79  CALCIUM 9.1 7.5*  AST 24  --   ALT 17  --   ALKPHOS 78  --   BILITOT 1.4*  --    ------------------------------------------------------------------------------------------------------------------  Cardiac Enzymes No results for input(s): "TROPONINI" in the last 168 hours. ------------------------------------------------------------------------------------------------------------------  RADIOLOGY:  CT Angio Chest/Abd/Pel for Dissection W and/or Wo Contrast  Result Date: 09/04/2021 CLINICAL DATA:  Chest and back pain diarrhea vomiting EXAM: CT ANGIOGRAPHY CHEST, ABDOMEN AND PELVIS TECHNIQUE: Non-contrast CT of the chest was initially obtained. Multidetector CT imaging through the chest, abdomen and pelvis was performed using the standard protocol during bolus administration of intravenous contrast. Multiplanar reconstructed images and MIPs were obtained and reviewed to evaluate the vascular anatomy. RADIATION DOSE REDUCTION: This exam was performed according to the departmental dose-optimization program which includes automated exposure control, adjustment of the mA and/or kV according to patient size and/or use of iterative reconstruction technique. CONTRAST:  157mL OMNIPAQUE IOHEXOL 350 MG/ML SOLN COMPARISON:  Chest x-ray 09/04/2021, CT chest  09/02/2021, PET CT 01/29/2021 FINDINGS: CTA CHEST FINDINGS Cardiovascular: Non contrasted images of the chest demonstrate no acute intramural hematoma. Moderate aortic atherosclerosis. No aneurysm. Left-sided pacing device with lead in the right ventricle. Normal cardiac size. Trace pericardial effusion. No dissection. Small penetrating ulcer or ulcerated plaque at the anterior distal descending thoracic aorta, series 5, image 83. Mediastinum/Nodes: Midline trachea. 1.5 cm nodule in the left lobe of thyroid, series 5, image 11. No suspicious lymph nodes. Esophagus within normal limits. Lungs/Pleura: Emphysema. No acute consolidation, pleural effusion, or pneumothorax. Redemonstrated spiculated right upper lobe pulmonary nodule measuring 9 mm, series 6, image 71. Other pulmonary nodules are unchanged, including small spiculated peripheral left lower lobe pulmonary nodule, measuring 6 mm, series 6, image 96. Musculoskeletal: Sternum is intact.  No acute osseous abnormality. Review of the MIP images confirms the above findings. CTA ABDOMEN AND PELVIS FINDINGS VASCULAR Aorta: Normal caliber aorta without aneurysm, dissection, vasculitis or significant stenosis. Moderate aortic atherosclerosis. Celiac: Mild stenosis at the origin of the celiac trunk. Distal vascular patency and no aneurysm, dissection or occlusion SMA: Patent without evidence of aneurysm, dissection, vasculitis or significant stenosis. Renals: Both renal arteries are patent without evidence of aneurysm, dissection, vasculitis, fibromuscular dysplasia or significant stenosis. IMA: Patent without evidence of aneurysm, dissection, vasculitis or significant stenosis. Inflow: Patent without evidence of aneurysm, dissection, vasculitis or significant stenosis. Mild to moderate atherosclerosis. Review of the MIP images confirms the above findings. NON-VASCULAR Hepatobiliary: Subcentimeter hypodensity in the liver too small to further characterize. Suspect  hepatic steatosis. No calcified gallstone or biliary dilatation Pancreas: Unremarkable. No pancreatic ductal dilatation or surrounding inflammatory changes. Spleen: Normal in size without focal abnormality. Adrenals/Urinary Tract: Adrenal glands are thickened but without dominant nodule. Cortical scarring in the mid to upper left kidney. No hydronephrosis. No ureteral stone. Small amount of air in the bladder. Stomach/Bowel: The stomach is nonenlarged. Diffuse fluid-filled small and large bowel. Fluid-filled cecum extends into the pelvis. The appendix is not well seen. Lymphatic: No suspicious lymph nodes Reproductive: Status post hysterectomy. No adnexal masses. Other: Negative for pelvic effusion or free air. Musculoskeletal: No acute osseous abnormality. Review of the MIP images confirms the above findings. IMPRESSION: 1. Negative for acute aortic dissection.  No aneurysm. 2. Moderate aortic atherosclerosis without high-grade stenosis or occlusive disease within the abdomen  or pelvis. Small penetrating ulcer or ulcerated plaque at the distal descending thoracic aorta. 3. Emphysema. Redemonstrated bilateral pulmonary nodules including 9 mm spiculated right upper lobe pulmonary nodule. Consider one of the following in 3 months for both low-risk and high-risk individuals: (a) repeat chest CT, (b) follow-up PET-CT, or (c) tissue sampling. This recommendation follows the consensus statement: Guidelines for Management of Incidental Pulmonary Nodules Detected on CT Images: From the Fleischner Society 2017; Radiology 2017; 284:228-243. 4. Diffuse fluid-filled small and large bowel without obstruction or wall thickening, findings are suggestive of enteritis 5. 1.5 cm nodule in the left lobe of the thyroid. Recommend thyroid US (ref: J Am Coll Radiol. 2015 Feb;12(2): 143-50). This should be performed non emergently Electronically Signed   By: Donavan Foil M.D.   On: 09/04/2021 23:05   DG Chest 2 View  Result Date:  09/04/2021 CLINICAL DATA:  Sepsis EXAM: CHEST - 2 VIEW COMPARISON:  CT chest dated 09/02/2021 FINDINGS: Mild left basilar scarring/atelectasis. Right lung is clear No pleural effusion or pneumothorax. The heart is normal size.  Left subclavian pacemaker. Visualized osseous structures are within normal limits. IMPRESSION: Mild left basilar scarring/atelectasis. Electronically Signed   By: Julian Hy M.D.   On: 09/04/2021 21:45      IMPRESSION AND PLAN:  Assessment and Plan: * Sepsis due to gram-negative UTI [facility]) - The patient will be admitted to a medical telemetry bed. - We will continue antibiotic therapy with IV Rocephin. - We will follow blood and urine cultures.  GERD without esophagitis We will continue PPI therapy.  Depression - We will continue Prozac.  Dyslipidemia - We will continue statin.  Paroxysmal atrial fibrillation (HCC) - The patient will be continued on Eliquis.   DVT prophylaxis: Lovenox.  Advanced Care Planning:  Code Status: full code.  Family Communication:  The plan of care was discussed in details with the patient (and family). I answered all questions. The patient agreed to proceed with the above mentioned plan. Further management will depend upon hospital course. Disposition Plan: Back to previous home environment Consults called: none.  All the records are reviewed and case discussed with ED provider.  Status is: Inpatient   At the time of the admission, it appears that the appropriate admission status for this patient is inpatient.  This is judged to be reasonable and necessary in order to provide the required intensity of service to ensure the patient's safety given the presenting symptoms, physical exam findings and initial radiographic and laboratory data in the context of comorbid conditions.  The patient requires inpatient status due to high intensity of service, high risk of further deterioration and high frequency of surveillance  required.  I certify that at the time of admission, it is my clinical judgment that the patient will require inpatient hospital care extending more than 2 midnights.                            Dispo: The patient is from: Home              Anticipated d/c is to: Home              Patient currently is not medically stable to d/c.              Difficult to place patient: No  Christel Mormon M.D on 09/05/2021 at 6:39 AM  [facility]   From 7 PM-7 AM, contact night-coverage [email or web address]  CC: Primary care physician; Tower, Wynelle Fanny, MD

## 2021-09-05 NOTE — Sepsis Progress Note (Signed)
Elink following Code Sepsis. 

## 2021-09-05 NOTE — Progress Notes (Signed)
Patient was seen and examined.  Admitted by nighttime hospitalist early morning hours with acute onset urinary frequency and urgency, subjective fever and chills nausea vomiting and diarrhea.  She was found to have adequate blood pressure tachypneic and tachycardic.  Low potassium.  Urine was grossly abnormal.  Cultures were drawn and started on antibiotics for UTI.  She is on 4 L oxygen at home and currently on 5 to 6 L but without any new shortness of breath. Blood cultures already growing gram-negative E. coli.  Plan: Continue ceftriaxone to treat for gram-negative bacteremia pending final culture sensitivity.   Chart: Same-day admission.  No charge visit.

## 2021-09-05 NOTE — ED Notes (Signed)
Pt c/o having nausea, no vomiting at this time, antiemetic given as ordered.

## 2021-09-05 NOTE — Assessment & Plan Note (Signed)
-   We will continue PPI therapy 

## 2021-09-05 NOTE — Progress Notes (Signed)
CODE SEPSIS - PHARMACY COMMUNICATION  **Broad Spectrum Antibiotics should be administered within 1 hour of Sepsis diagnosis**  Time Code Sepsis Called/Page Received: 0022  Antibiotics Ordered: Ceftriaxone  Time of 1st antibiotic administration: 0025  Renda Rolls, PharmD, [facility] 09/05/2021 12:14 AM

## 2021-09-05 NOTE — Assessment & Plan Note (Signed)
-   The patient will be admitted to a medical telemetry bed. - We will continue antibiotic therapy with IV Rocephin. - We will follow blood and urine cultures.

## 2021-09-06 DIAGNOSIS — A4150 Gram-negative sepsis, unspecified: Secondary | ICD-10-CM | POA: Diagnosis not present

## 2021-09-06 DIAGNOSIS — N390 Urinary tract infection, site not specified: Secondary | ICD-10-CM | POA: Diagnosis not present

## 2021-09-06 LAB — MAGNESIUM: Magnesium: 1.6 mg/dL — ABNORMAL LOW (ref 1.7–2.4)

## 2021-09-06 LAB — CBC WITH DIFFERENTIAL/PLATELET
Abs Immature Granulocytes: 0.02 10*3/uL (ref 0.00–0.07)
Basophils Absolute: 0 10*3/uL (ref 0.0–0.1)
Basophils Relative: 0 %
Eosinophils Absolute: 0 10*3/uL (ref 0.0–0.5)
Eosinophils Relative: 0 %
HCT: 31.2 % — ABNORMAL LOW (ref 36.0–46.0)
Hemoglobin: 10.2 g/dL — ABNORMAL LOW (ref 12.0–15.0)
Immature Granulocytes: 0 %
Lymphocytes Relative: 7 %
Lymphs Abs: 0.4 10*3/uL — ABNORMAL LOW (ref 0.7–4.0)
MCH: 29.7 pg (ref 26.0–34.0)
MCHC: 32.7 g/dL (ref 30.0–36.0)
MCV: 91 fL (ref 80.0–100.0)
Monocytes Absolute: 0.4 10*3/uL (ref 0.1–1.0)
Monocytes Relative: 7 %
Neutro Abs: 4.9 10*3/uL (ref 1.7–7.7)
Neutrophils Relative %: 86 %
Platelets: 173 10*3/uL (ref 150–400)
RBC: 3.43 MIL/uL — ABNORMAL LOW (ref 3.87–5.11)
RDW: 12.9 % (ref 11.5–15.5)
WBC: 5.7 10*3/uL (ref 4.0–10.5)
nRBC: 0 % (ref 0.0–0.2)

## 2021-09-06 LAB — BASIC METABOLIC PANEL
Anion gap: 3 — ABNORMAL LOW (ref 5–15)
BUN: 11 mg/dL (ref 8–23)
CO2: 20 mmol/L — ABNORMAL LOW (ref 22–32)
Calcium: 8.8 mg/dL — ABNORMAL LOW (ref 8.9–10.3)
Chloride: 111 mmol/L (ref 98–111)
Creatinine, Ser: 0.77 mg/dL (ref 0.44–1.00)
GFR, Estimated: >60 mL/min (ref >60)
Glucose, Bld: 116 mg/dL — ABNORMAL HIGH (ref 70–99)
Potassium: 4.3 mmol/L (ref 3.5–5.1)
Sodium: 134 mmol/L — ABNORMAL LOW (ref 135–145)

## 2021-09-06 LAB — PHOSPHORUS: Phosphorus: 1.8 mg/dL — ABNORMAL LOW (ref 2.5–4.6)

## 2021-09-06 LAB — LACTIC ACID, PLASMA: Lactic Acid, Venous: 0.6 mmol/L (ref 0.5–1.9)

## 2021-09-06 LAB — PROCALCITONIN: Procalcitonin: 1.34 ng/mL

## 2021-09-06 MED ORDER — MAGNESIUM SULFATE 2 GM/50ML IV SOLN
2.0000 g | Freq: Once | INTRAVENOUS | Status: AC
  Administered 2021-09-06: 2 g via INTRAVENOUS
  Filled 2021-09-06: qty 50

## 2021-09-06 MED ORDER — TRAMADOL HCL 50 MG PO TABS
50.0000 mg | ORAL_TABLET | Freq: Four times a day (QID) | ORAL | Status: DC | PRN
  Administered 2021-09-06 – 2021-09-09 (×3): 50 mg via ORAL
  Filled 2021-09-06 (×3): qty 1

## 2021-09-06 MED ORDER — FLUTICASONE PROPIONATE 50 MCG/ACT NA SUSP
1.0000 | Freq: Every day | NASAL | Status: DC
  Administered 2021-09-06 – 2021-09-09 (×4): 1 via NASAL
  Filled 2021-09-06: qty 16

## 2021-09-06 MED ORDER — ENSURE ENLIVE PO LIQD
237.0000 mL | Freq: Three times a day (TID) | ORAL | Status: DC
  Administered 2021-09-06 – 2021-09-07 (×2): 237 mL via ORAL

## 2021-09-06 MED ORDER — ADULT MULTIVITAMIN W/MINERALS CH
1.0000 | ORAL_TABLET | Freq: Every day | ORAL | Status: DC
Start: 2021-09-07 — End: 2021-09-09
  Administered 2021-09-07 – 2021-09-09 (×3): 1 via ORAL
  Filled 2021-09-06 (×3): qty 1

## 2021-09-06 MED ORDER — ALBUTEROL SULFATE (2.5 MG/3ML) 0.083% IN NEBU
3.0000 mL | INHALATION_SOLUTION | RESPIRATORY_TRACT | Status: DC | PRN
Start: 2021-09-06 — End: 2021-09-09

## 2021-09-06 MED ORDER — POTASSIUM PHOSPHATES 15 MMOLE/5ML IV SOLN
30.0000 mmol | Freq: Once | INTRAVENOUS | Status: AC
  Administered 2021-09-06: 30 mmol via INTRAVENOUS
  Filled 2021-09-06: qty 10

## 2021-09-06 NOTE — Progress Notes (Signed)
PROGRESS NOTE    Diana Cook  RCB:[id number] DOB: 06/09/1950 DOA: 09/04/2021 PCP: Abner Greenspan, MD    Brief Narrative:  71 year old with history of asthma, arthritis, COPD, paroxysmal A-fib on anticoagulation, seizure disorder and coronary artery disease, sick sinus syndrome status post pacemaker presents to the ER with urinary frequency and urgency, fever and chills, nausea vomiting and diarrhea for 1 day.  Headache and neck pain.  At the emergency room blood pressure was stable.  Tachypneic and tachycardic.  87% on 4 L oxygen.  CT angiogram of the chest abdomen pelvis without acute findings.  Admitted and treated with antibiotics and IV fluids.  E. coli bacteremia found.  Clinically improving.   Assessment & Plan:   Sepsis present on admission secondary to E. coli bacteremia from UTI. Blood cultures positive for E. coli, sensitivity pending Urine culture positive for gram-negative rods, sensitivity pending Some clinical improvement today.  On Rocephin.  Continue IV antibiotics until final sensitivity. CT scan abdomen pelvis with no evidence of hydronephrosis or obstruction. No postvoid residual.  Paroxysmal A-fib on anticoagulation, sick sinus syndrome status post pacemaker: Stable.  Pacemaker functioning well.  Therapeutic on Eliquis.  Rate controlled on Cardizem.  History of seizure: Stable on Keppra.  Depression: On Prozac.  Chronic hypoxemic respiratory failure: On 4 L oxygen at home.  Today on 4 to 5 L of oxygen.  Continue bronchodilator therapy.  Hypokalemia Hypomagnesemia Hypophosphatemia: Replace aggressively.    DVT prophylaxis:  apixaban (ELIQUIS) tablet 5 mg   Code Status: Full code Family Communication: Husband and daughter at bedside 7/30 Disposition Plan: Status is: Inpatient Remains inpatient appropriate because: Treated for bacteremia on IV antibiotics     Consultants:  None  Procedures:  None  Antimicrobials:  Rocephin  7/12--   Subjective: Patient seen and examined.  Complains of a stuffy nose, bilateral ear pain and fullness and some back pain. Starting Claritin and Flonase. Afebrile overnight. Start mobilizing with PT OT.  Objective: Vitals:   09/05/21 1820 09/05/21 2021 09/06/21 0436 09/06/21 0831  BP:  (!) 119/59 134/60 128/72  Pulse:  91 81 78  Resp:  20 20 17   Temp:  98.8 F (37.1 C) 98.8 F (37.1 C) 99 F (37.2 C)  TempSrc:  Oral Oral   SpO2: 94% 93% 91% 97%  Weight:      Height:        Intake/Output Summary (Last 24 hours) at 09/06/2021 1244 Last data filed at 09/06/2021 0434 Gross per 24 hour  Intake 1339.51 ml  Output 1150 ml  Net 189.51 ml   Filed Weights   09/04/21 2042  Weight: 86.2 kg    Examination:  General exam: Appears calm and comfortable  Chronically sick looking.  On 5 L oxygen.  Not in any distress. Respiratory system: No added sounds.  Pacemaker on the left precordium present. Cardiovascular system: S1 & S2 heard, RRR. No pedal edema. Gastrointestinal system: Abdomen is nondistended, soft and nontender. No organomegaly or masses felt. Normal bowel sounds heard. Central nervous system: Alert and oriented. No focal neurological deficits. Extremities: Symmetric 5 x 5 power. Skin: No rashes, lesions or ulcers Psychiatry: Judgement and insight appear normal. Mood & affect appropriate.     Data Reviewed: I have personally reviewed following labs and imaging studies  CBC: Recent Labs  Lab 09/04/21 2107 09/05/21 0508 09/06/21 0539  WBC 10.2 8.5 5.7  NEUTROABS 9.2*  --  4.9  HGB 12.0 10.2* 10.2*  HCT 37.7 31.9* 31.2*  MCV  91.3 92.2 91.0  PLT 224 191 269   Basic Metabolic Panel: Recent Labs  Lab 09/04/21 2107 09/05/21 0508 09/06/21 0539  NA 134* 135 134*  K 3.9 2.9* 4.3  CL 103 111 111  CO2 20* 19* 20*  GLUCOSE 131* 164* 116*  BUN 23 18 11   CREATININE 1.07* 0.79 0.77  CALCIUM 9.1 7.5* 8.8*  MG  --   --  1.6*  PHOS  --   --  1.8*    GFR: Estimated Creatinine Clearance: 72 mL/min (by C-G formula based on SCr of 0.77 mg/dL). Liver Function Tests: Recent Labs  Lab 09/04/21 2107  AST 24  ALT 17  ALKPHOS 78  BILITOT 1.4*  PROT 7.5  ALBUMIN 3.9   Recent Labs  Lab 09/04/21 2107  LIPASE 23   No results for input(s): "AMMONIA" in the last 168 hours. Coagulation Profile: Recent Labs  Lab 09/04/21 2107 09/05/21 0508  INR 1.4* 1.5*   Cardiac Enzymes: No results for input(s): "CKTOTAL", "CKMB", "CKMBINDEX", "TROPONINI" in the last 168 hours. BNP (last 3 results) No results for input(s): "PROBNP" in the last 8760 hours. HbA1C: No results for input(s): "HGBA1C" in the last 72 hours. CBG: No results for input(s): "GLUCAP" in the last 168 hours. Lipid Profile: No results for input(s): "CHOL", "HDL", "LDLCALC", "TRIG", "CHOLHDL", "LDLDIRECT" in the last 72 hours. Thyroid Function Tests: No results for input(s): "TSH", "T4TOTAL", "FREET4", "T3FREE", "THYROIDAB" in the last 72 hours. Anemia Panel: No results for input(s): "VITAMINB12", "FOLATE", "FERRITIN", "TIBC", "IRON", "RETICCTPCT" in the last 72 hours. Sepsis Labs: Recent Labs  Lab 09/04/21 2107 09/04/21 2236 09/05/21 0508 09/05/21 2346 09/06/21 0539  PROCALCITON  --  2.43 1.71  --  1.34  LATICACIDVEN 1.5  --   --  0.6  --     Recent Results (from the past 240 hour(s))  Culture, blood (Routine x 2)     Status: None (Preliminary result)   Collection Time: 09/04/21  9:07 PM   Specimen: BLOOD  Result Value Ref Range Status   Specimen Description BLOOD RIGHT FOREARM  Final   Special Requests   Final    BOTTLES DRAWN AEROBIC AND ANAEROBIC Blood Culture adequate volume   Culture  Setup Time   Final    GRAM NEGATIVE RODS AEROBIC BOTTLE ONLY CRITICAL VALUE NOTED.  VALUE IS CONSISTENT WITH PREVIOUSLY REPORTED AND CALLED VALUE. Performed at [facility], [address] [place], [place] [address]    Culture GRAM NEGATIVE RODS  Final    Report Status PENDING  Incomplete  SARS Coronavirus 2 by RT PCR (hospital order, performed in [facility] hospital lab) *cepheid single result test* Anterior Nasal Swab     Status: None   Collection Time: 09/04/21  9:07 PM   Specimen: Anterior Nasal Swab  Result Value Ref Range Status   SARS Coronavirus 2 by RT PCR NEGATIVE NEGATIVE Final    Comment: (NOTE) SARS-CoV-2 target nucleic acids are NOT DETECTED.  The SARS-CoV-2 RNA is generally detectable in upper and lower respiratory specimens during the acute phase of infection. The lowest concentration of SARS-CoV-2 viral copies this assay can detect is 250 copies / mL. A negative result does not preclude SARS-CoV-2 infection and should not be used as the sole basis for treatment or other patient management decisions.  A negative result may occur with improper specimen collection / handling, submission of specimen other than nasopharyngeal swab, presence of viral mutation(s) within the areas targeted by this assay, and inadequate number  of viral copies (<250 copies / mL). A negative result must be combined with clinical observations, patient history, and epidemiological information.  Fact Sheet for Patients:   https://www.patel.info/  Fact Sheet for Healthcare Providers: https://Oquin.com/  This test is not yet approved or  cleared by the [place] FDA and has been authorized for detection and/or diagnosis of SARS-CoV-2 by FDA under an Emergency Use Authorization (EUA).  This EUA will remain in effect (meaning this test can be used) for the duration of the COVID-19 declaration under Section 564(b)(1) of the Act, 21 U.S.C. section 360bbb-3(b)(1), unless the authorization is terminated or revoked sooner.  Performed at [facility], [address] [place], [place] [address]   Culture, blood (Routine x 2)     Status: Abnormal (Preliminary result)   Collection Time: 09/04/21  10:36 PM   Specimen: BLOOD  Result Value Ref Range Status   Specimen Description   Final    BLOOD LEFT HAND Performed at [facility], [address], [place], [place] [address]    Special Requests   Final    BOTTLES DRAWN AEROBIC AND ANAEROBIC Blood Culture adequate volume Performed at [facility], [address] [place], [place] [address]    Culture  Setup Time   Final    Organism ID to follow GRAM NEGATIVE RODS IN BOTH AEROBIC AND ANAEROBIC BOTTLES CRITICAL RESULT CALLED TO, READ BACK BY AND VERIFIED WITH: Eleonore Chiquito 09/05/2021 @0937  Performed at [facility], [address] [place], [place] [address]    Culture (A)  Final    ESCHERICHIA COLI SUSCEPTIBILITIES TO FOLLOW Performed at [facility] [address], [place], [place] [address]    Report Status PENDING  Incomplete  Blood Culture ID Panel (Reflexed)     Status: Abnormal   Collection Time: 09/04/21 10:36 PM  Result Value Ref Range Status   Enterococcus faecalis NOT DETECTED NOT DETECTED Final   Enterococcus Faecium NOT DETECTED NOT DETECTED Final   Listeria monocytogenes NOT DETECTED NOT DETECTED Final   Staphylococcus species NOT DETECTED NOT DETECTED Final   Staphylococcus aureus (BCID) NOT DETECTED NOT DETECTED Final   Staphylococcus epidermidis NOT DETECTED NOT DETECTED Final   Staphylococcus lugdunensis NOT DETECTED NOT DETECTED Final   Streptococcus species NOT DETECTED NOT DETECTED Final   Streptococcus agalactiae NOT DETECTED NOT DETECTED Final   Streptococcus pneumoniae NOT DETECTED NOT DETECTED Final   Streptococcus pyogenes NOT DETECTED NOT DETECTED Final   A.calcoaceticus-baumannii NOT DETECTED NOT DETECTED Final   Bacteroides fragilis NOT DETECTED NOT DETECTED Final   Enterobacterales DETECTED (A) NOT DETECTED Final    Comment: Enterobacterales represent a large order of gram negative bacteria, not a single organism. CRITICAL RESULT CALLED TO, READ BACK BY AND  VERIFIED WITH: Winfield Rast PATEL 09/05/2021 @0937     Enterobacter cloacae complex NOT DETECTED NOT DETECTED Final   Escherichia coli DETECTED (A) NOT DETECTED Final    Comment: CRITICAL RESULT CALLED TO, READ BACK BY AND VERIFIED WITH: Winfield Rast PATEL 09/05/2021 @0937  SRR    Klebsiella aerogenes NOT DETECTED NOT DETECTED Final   Klebsiella oxytoca NOT DETECTED NOT DETECTED Final   Klebsiella pneumoniae NOT DETECTED NOT DETECTED Final   Proteus species NOT DETECTED NOT DETECTED Final   Salmonella species NOT DETECTED NOT DETECTED Final   Serratia marcescens NOT DETECTED NOT DETECTED Final   Haemophilus influenzae NOT DETECTED NOT DETECTED Final   Neisseria meningitidis NOT DETECTED NOT DETECTED Final   Pseudomonas aeruginosa NOT DETECTED NOT DETECTED  Final   Stenotrophomonas maltophilia NOT DETECTED NOT DETECTED Final   Candida albicans NOT DETECTED NOT DETECTED Final   Candida auris NOT DETECTED NOT DETECTED Final   Candida glabrata NOT DETECTED NOT DETECTED Final   Candida krusei NOT DETECTED NOT DETECTED Final   Candida parapsilosis NOT DETECTED NOT DETECTED Final   Candida tropicalis NOT DETECTED NOT DETECTED Final   Cryptococcus neoformans/gattii NOT DETECTED NOT DETECTED Final   CTX-M ESBL NOT DETECTED NOT DETECTED Final   Carbapenem resistance IMP NOT DETECTED NOT DETECTED Final   Carbapenem resistance KPC NOT DETECTED NOT DETECTED Final   Carbapenem resistance NDM NOT DETECTED NOT DETECTED Final   Carbapenem resist OXA 48 LIKE NOT DETECTED NOT DETECTED Final   Carbapenem resistance VIM NOT DETECTED NOT DETECTED Final    Comment: Performed at [facility], [address] [place], [place] [address]  Urine Culture     Status: Abnormal (Preliminary result)   Collection Time: 09/04/21 11:12 PM   Specimen: Urine, Random  Result Value Ref Range Status   Specimen Description   Final    URINE, RANDOM Performed at [facility], [address] [place], [place]  [address]    Special Requests   Final    NONE Performed at [facility], [address] [place], [place] [address]    Culture >=100,000 COLONIES/mL GRAM NEGATIVE RODS (A)  Final   Report Status PENDING  Incomplete         Radiology Studies: CT Angio Chest/Abd/Pel for Dissection W and/or Wo Contrast  Result Date: 09/04/2021 CLINICAL DATA:  Chest and back pain diarrhea vomiting EXAM: CT ANGIOGRAPHY CHEST, ABDOMEN AND PELVIS TECHNIQUE: Non-contrast CT of the chest was initially obtained. Multidetector CT imaging through the chest, abdomen and pelvis was performed using the standard protocol during bolus administration of intravenous contrast. Multiplanar reconstructed images and MIPs were obtained and reviewed to evaluate the vascular anatomy. RADIATION DOSE REDUCTION: This exam was performed according to the departmental dose-optimization program which includes automated exposure control, adjustment of the mA and/or kV according to patient size and/or use of iterative reconstruction technique. CONTRAST:  169mL OMNIPAQUE IOHEXOL 350 MG/ML SOLN COMPARISON:  Chest x-ray 09/04/2021, CT chest 09/02/2021, PET CT 01/29/2021 FINDINGS: CTA CHEST FINDINGS Cardiovascular: Non contrasted images of the chest demonstrate no acute intramural hematoma. Moderate aortic atherosclerosis. No aneurysm. Left-sided pacing device with lead in the right ventricle. Normal cardiac size. Trace pericardial effusion. No dissection. Small penetrating ulcer or ulcerated plaque at the anterior distal descending thoracic aorta, series 5, image 83. Mediastinum/Nodes: Midline trachea. 1.5 cm nodule in the left lobe of thyroid, series 5, image 11. No suspicious lymph nodes. Esophagus within normal limits. Lungs/Pleura: Emphysema. No acute consolidation, pleural effusion, or pneumothorax. Redemonstrated spiculated right upper lobe pulmonary nodule measuring 9 mm, series 6, image 71. Other pulmonary nodules are unchanged, including  small spiculated peripheral left lower lobe pulmonary nodule, measuring 6 mm, series 6, image 96. Musculoskeletal: Sternum is intact.  No acute osseous abnormality. Review of the MIP images confirms the above findings. CTA ABDOMEN AND PELVIS FINDINGS VASCULAR Aorta: Normal caliber aorta without aneurysm, dissection, vasculitis or significant stenosis. Moderate aortic atherosclerosis. Celiac: Mild stenosis at the origin of the celiac trunk. Distal vascular patency and no aneurysm, dissection or occlusion SMA: Patent without evidence of aneurysm, dissection, vasculitis or significant stenosis. Renals: Both renal arteries are patent without evidence of aneurysm, dissection, vasculitis, fibromuscular dysplasia or significant stenosis. IMA: Patent without evidence of aneurysm, dissection, vasculitis  or significant stenosis. Inflow: Patent without evidence of aneurysm, dissection, vasculitis or significant stenosis. Mild to moderate atherosclerosis. Review of the MIP images confirms the above findings. NON-VASCULAR Hepatobiliary: Subcentimeter hypodensity in the liver too small to further characterize. Suspect hepatic steatosis. No calcified gallstone or biliary dilatation Pancreas: Unremarkable. No pancreatic ductal dilatation or surrounding inflammatory changes. Spleen: Normal in size without focal abnormality. Adrenals/Urinary Tract: Adrenal glands are thickened but without dominant nodule. Cortical scarring in the mid to upper left kidney. No hydronephrosis. No ureteral stone. Small amount of air in the bladder. Stomach/Bowel: The stomach is nonenlarged. Diffuse fluid-filled small and large bowel. Fluid-filled cecum extends into the pelvis. The appendix is not well seen. Lymphatic: No suspicious lymph nodes Reproductive: Status post hysterectomy. No adnexal masses. Other: Negative for pelvic effusion or free air. Musculoskeletal: No acute osseous abnormality. Review of the MIP images confirms the above findings.  IMPRESSION: 1. Negative for acute aortic dissection.  No aneurysm. 2. Moderate aortic atherosclerosis without high-grade stenosis or occlusive disease within the abdomen or pelvis. Small penetrating ulcer or ulcerated plaque at the distal descending thoracic aorta. 3. Emphysema. Redemonstrated bilateral pulmonary nodules including 9 mm spiculated right upper lobe pulmonary nodule. Consider one of the following in 3 months for both low-risk and high-risk individuals: (a) repeat chest CT, (b) follow-up PET-CT, or (c) tissue sampling. This recommendation follows the consensus statement: Guidelines for Management of Incidental Pulmonary Nodules Detected on CT Images: From the Fleischner Society 2017; Radiology 2017; 284:228-243. 4. Diffuse fluid-filled small and large bowel without obstruction or wall thickening, findings are suggestive of enteritis 5. 1.5 cm nodule in the left lobe of the thyroid. Recommend thyroid US (ref: J Am Coll Radiol. 2015 Feb;12(2): 143-50). This should be performed non emergently Electronically Signed   By: Donavan Foil M.D.   On: 09/04/2021 23:05   DG Chest 2 View  Result Date: 09/04/2021 CLINICAL DATA:  Sepsis EXAM: CHEST - 2 VIEW COMPARISON:  CT chest dated 09/02/2021 FINDINGS: Mild left basilar scarring/atelectasis. Right lung is clear No pleural effusion or pneumothorax. The heart is normal size.  Left subclavian pacemaker. Visualized osseous structures are within normal limits. IMPRESSION: Mild left basilar scarring/atelectasis. Electronically Signed   By: Julian Hy M.D.   On: 09/04/2021 21:45        Scheduled Meds:  apixaban  5 mg Oral BID   atorvastatin  40 mg Oral Daily   cholecalciferol  1,000 Units Oral Daily   diclofenac Sodium  2 g Topical QID   FLUoxetine  20 mg Oral Daily   fluticasone  1 spray Each Nare Daily   levETIRAcetam  250 mg Oral BID   loratadine  10 mg Oral Daily   pantoprazole  40 mg Oral Daily   Continuous Infusions:  cefTRIAXone  (ROCEPHIN)  IV 2 g (09/06/21 1232)   potassium PHOSPHATE IVPB (in mmol)       LOS: 1 day    Time spent: 35 minutes    Barb Merino, MD [facility] Pager [phone]

## 2021-09-06 NOTE — Progress Notes (Signed)
Initial Nutrition Assessment  DOCUMENTATION CODES:   Not applicable  INTERVENTION:   Ensure Enlive po TID, each supplement provides 350 kcal and 20 grams of protein.  MVI po daily   Liberalize diet   Pt at high refeed risk; recommend monitor potassium, magnesium and phosphorus labs daily until stable  NUTRITION DIAGNOSIS:   Inadequate oral intake related to acute illness as evidenced by per patient/family report.  GOAL:   Patient will meet greater than or equal to 90% of their needs  MONITOR:   PO intake, Supplement acceptance, Labs, Weight trends, Skin, I & O's  REASON FOR ASSESSMENT:   Malnutrition Screening Tool    ASSESSMENT:   71 y/o female with h/o COPD, recurrent SBO, PAF, heart block s/p pacemaker, CAD, depression, HLD, GERD, hidradenitis and breast cancer who is admitted with UTI, bacteremia and sepsis.  Met with pt in room today. Pt reports decreased appetite and oral intake for 2 weeks pta. Pt reports that her appetite remains poor in hospital. Pt's lunch tray is sitting on her side table untouched. RD discussed with pt the importance of adequate nutrition needed to preserve lean muscle. Pt is willing to drink chocolate Ensure in hospital. RD will add supplements and MVI to help pt meet her estimated needs. RD will also liberalize pt's diet. Pt remains at high refeed risk. Per chart, pt appears weight stable at baseline.   Medications reviewed and include: vitamin D3, protonix, ceftriaxone, Kphos   Labs reviewed: Na 134(L), K 4.3 wnl, P 1.8(L), Mg 1.6(L) Hgb 10.2(L), Hct 31.2(L)  NUTRITION - FOCUSED PHYSICAL EXAM:  Flowsheet Row Most Recent Value  Orbital Region No depletion  Upper Arm Region No depletion  Thoracic and Lumbar Region No depletion  Buccal Region No depletion  Temple Region No depletion  Clavicle Bone Region No depletion  Clavicle and Acromion Bone Region No depletion  Scapular Bone Region No depletion  Dorsal Hand No depletion   Patellar Region No depletion  Anterior Thigh Region No depletion  Posterior Calf Region No depletion  Edema (RD Assessment) None  Hair Reviewed  Eyes Reviewed  Mouth Reviewed  Skin Reviewed  Nails Reviewed   Diet Order:   Diet Order             Diet Heart Room service appropriate? Yes; Fluid consistency: Thin  Diet effective now                  EDUCATION NEEDS:   Education needs have been addressed  Skin:  Skin Assessment: Reviewed RN Assessment  Last BM:  7/13- type 7  Height:   Ht Readings from Last 1 Encounters:  09/04/21 $RemoveB'5\' 8"'[id number]$  (1.727 m)    Weight:   Wt Readings from Last 1 Encounters:  09/06/21 87 kg    Ideal Body Weight:  63.6 kg  BMI:  Body mass index is 29.16 kg/m.  Estimated Nutritional Needs:   Kcal:  1700-2000kcal/day  Protein:  85-100g/day  Fluid:  1.7-1.9L/day  Koleen Distance MS, RD, LDN Please refer to [facility] for RD and/or RD on-call/weekend/after hours pager

## 2021-09-06 NOTE — Evaluation (Signed)
Occupational Therapy Evaluation Patient Details Name: Diana Cook MRN: [id number] DOB: Feb 23, 1951 Today's Date: 09/06/2021   History of Present Illness pt is a 71 year old admitted with urinary frequency and urgency, fever and chills, nausea vomiting and diarrhea for 1 day PMH significant for asthma, arthritis, COPD, paroxysmal A-fib on anticoagulation, seizure disorder and coronary artery disease, sick sinus syndrome status post pacemaker   Clinical Impression   Chart reviewed, RN cleared pt for participation in OT evaluation. Pt is limited by cardiopulminary status, reports assist needed for LB dressing at baseline due to poor endurance. Pt is currently attending pulmonary rehab last amb approx 1000 feet in 6 minutes per chart. At this time pt presents with deficits in activity tolerance, endurance affecting optimal and safe ADL completion. Pt educated on use of AE for LB dressing, use of BSC at night at home as pt reports increased assist needed when amb bathroom at night. Pt could benefit from acute OT to address deficits and to further educate on energy conservation techniques. Pt is left as received, NAD, all needs met.      Recommendations for follow up therapy are one component of a multi-disciplinary discharge planning process, led by the attending physician.  Recommendations may be updated based on patient status, additional functional criteria and insurance authorization.   Follow Up Recommendations  Other (comment) (return to pulmonary rehab)    Assistance Recommended at Discharge Intermittent Supervision/Assistance  Patient can return home with the following A little help with bathing/dressing/bathroom    Functional Status Assessment  Patient has had a recent decline in their functional status and demonstrates the ability to make significant improvements in function in a reasonable and predictable amount of time.  Equipment Recommendations  BSC/3in1;Other (comment) (reacher and  sock aid)    Recommendations for Other Services       Precautions / Restrictions Precautions Precautions: Fall Restrictions Weight Bearing Restrictions: No      Mobility Bed Mobility Overal bed mobility: Modified Independent                  Transfers Overall transfer level: Needs assistance Equipment used: 1 person hand held assist Transfers: Sit to/from Stand Sit to Stand: Min assist                  Balance Overall balance assessment: Needs assistance Sitting-balance support: Feet supported Sitting balance-Leahy Scale: Good     Standing balance support: Single extremity supported Standing balance-Leahy Scale: Fair                             ADL either performed or assessed with clinical judgement   ADL Overall ADL's : Needs assistance/impaired                                       General ADL Comments: LB dressing with MIN A and step by step vcs for use of sock aid and reacher, simulated toilet transfer with CGA with no AD, grooming seated with SET UP     Vision Patient Visual Report: No change from baseline       Perception     Praxis      Pertinent Vitals/Pain Pain Assessment Pain Assessment: No/denies pain     Hand Dominance     Extremity/Trunk Assessment Upper Extremity Assessment Upper Extremity Assessment: Generalized weakness  Lower Extremity Assessment Lower Extremity Assessment: Generalized weakness   Cervical / Trunk Assessment Cervical / Trunk Assessment: Normal   Communication Communication Communication: No difficulties   Cognition Arousal/Alertness: Awake/alert Behavior During Therapy: WFL for tasks assessed/performed Overall Cognitive Status: Within Functional Limits for tasks assessed                                       General Comments  spo2 down to 82% with extended seated on edge of bed 7L: via [place]; up to 85% on 7L via [place] in supine with HOB raised     Exercises     Shoulder Instructions      Home Living Family/patient expects to be discharged to:: Private residence Living Arrangements: Spouse/significant other Available Help at Discharge: Family Type of Home: House Home Access: Level entry     Home Layout: One level     Bathroom Shower/Tub: Occupational psychologist: Standard     Home Equipment: Rollator (4 wheels);Shower seat;Grab bars - tub/shower          Prior Functioning/Environment Prior Level of Function : Needs assist       Physical Assist : ADLs (physical)   ADLs (physical): Bathing;IADLs Mobility Comments: amb in community with rollator ADLs Comments: assist for LB dressing, MOD I for UB dressing, grooming; assist for IADLs as needed        OT Problem List: Decreased strength;Decreased activity tolerance      OT Treatment/Interventions: Self-care/ADL training;Therapeutic exercise;DME and/or AE instruction;Energy conservation;Therapeutic activities;Balance training;Patient/family education    OT Goals(Current goals can be found in the care plan section) Acute Rehab OT Goals Patient Stated Goal: go home OT Goal Formulation: With patient/family Time For Goal Achievement: 09/20/21 Potential to Achieve Goals: Good ADL Goals Pt Will Perform Grooming: with modified independence;sitting;standing Pt Will Perform Lower Body Dressing: with modified independence;sit to/from stand;with adaptive equipment Pt Will Transfer to Toilet: with modified independence;ambulating Pt Will Perform Toileting - Clothing Manipulation and hygiene: with modified independence;sit to/from stand  OT Frequency: Min 2X/week    Co-evaluation              AM-PAC OT "6 Clicks" Daily Activity     Outcome Measure Help from another person eating meals?: None Help from another person taking care of personal grooming?: None Help from another person toileting, which includes using toliet, bedpan, or urinal?: A Little Help  from another person bathing (including washing, rinsing, drying)?: A Little Help from another person to put on and taking off regular upper body clothing?: A Little Help from another person to put on and taking off regular lower body clothing?: A Lot 6 Click Score: 19   End of Session Equipment Utilized During Treatment: Rolling walker (2 wheels);Gait belt Nurse Communication: Mobility status  Activity Tolerance: Patient tolerated treatment well Patient left: in bed;with call bell/phone within reach;with bed alarm set  OT Visit Diagnosis: Unsteadiness on feet (R26.81)                Time: 5883-2549 OT Time Calculation (min): 31 min Charges:  OT General Charges $OT Visit: 1 Visit OT Evaluation $OT Eval Low Complexity: 1 Low OT Treatments $Self Care/Home Management : 8-22 mins  Shanon Payor, OTD OTR/L  09/06/21, 3:30 PM

## 2021-09-06 NOTE — Evaluation (Signed)
Physical Therapy Evaluation Patient Details Name: Diana Cook MRN: [id number] DOB: March 24, 1950 Today's Date: 09/06/2021  History of Present Illness  Pt is a 71 y.o. Caucasian female with medical history significant for asthma, arthritis, COPD, paroxysmal atrial fibrillation, seizure disorder and coronary artery disease, pacemaker.   Clinical Impression  Patient alert, agreeable to PT reported some SOB. Pt reported at baseline she was in pulmonary rehab, lives with her husband who is available to assist most of the time at discharge, uses the rollator for community ambulation. Some help for ADLs (specifically socks).   She was able to transfer to EOB modI with bed rails, extra time. Sit <> stand several times, CGA with RW, minA without it. She ambulated ~39ft total with CGA and RW, was able to stop at the sink and brush her teeth. On 6L for ambulation and mobility due to 5L 87-90% at rest.  Overall the patient demonstrated deficits (see "PT Problem List") that impede the patient's functional abilities, safety, and mobility and would benefit from skilled PT intervention. Recommendation at this time is to return to pulmonary rehab at discharge and frequent/constant supervision/assistance for her husband.        Recommendations for follow up therapy are one component of a multi-disciplinary discharge planning process, led by the attending physician.  Recommendations may be updated based on patient status, additional functional criteria and insurance authorization.  Follow Up Recommendations Other (comment) (pulmonary rehab)      Assistance Recommended at Discharge Frequent or constant Supervision/Assistance  Patient can return home with the following  A little help with walking and/or transfers;A little help with bathing/dressing/bathroom;Assistance with cooking/housework;Assist for transportation;Help with stairs or ramp for entrance    Equipment Recommendations Rolling walker (2  wheels);BSC/3in1  Recommendations for Other Services       Functional Status Assessment Patient has had a recent decline in their functional status and demonstrates the ability to make significant improvements in function in a reasonable and predictable amount of time.     Precautions / Restrictions Precautions Precautions: Fall Restrictions Weight Bearing Restrictions: No      Mobility  Bed Mobility Overal bed mobility: Modified Independent                  Transfers Overall transfer level: Needs assistance Equipment used: Rolling walker (2 wheels), 1 person hand held assist Transfers: Sit to/from Stand, Bed to chair/wheelchair/BSC Sit to Stand: Min guard   Step pivot transfers: Min assist            Ambulation/Gait Ambulation/Gait assistance: Min guard Gait Distance (Feet): 25 Feet Assistive device: Rolling walker (2 wheels)         General Gait Details: slow, cautious, some SOB noted  Stairs            Wheelchair Mobility    Modified Rankin (Stroke Patients Only)       Balance Overall balance assessment: Needs assistance Sitting-balance support: Feet supported Sitting balance-Leahy Scale: Good     Standing balance support: Reliant on assistive device for balance Standing balance-Leahy Scale: Fair                               Pertinent Vitals/Pain Pain Assessment Pain Assessment: No/denies pain    Home Living Family/patient expects to be discharged to:: Private residence Living Arrangements: Spouse/significant other Available Help at Discharge: Family Type of Home: House Home Access: Level entry  Home Layout: One level Home Equipment: Rollator (4 wheels);Shower seat;Grab bars - tub/shower      Prior Function Prior Level of Function : Needs assist       Physical Assist : ADLs (physical)   ADLs (physical): Bathing;IADLs Mobility Comments: ambulatory in community with rollator, furniture walks in home        Hand Dominance        Extremity/Trunk Assessment   Upper Extremity Assessment Upper Extremity Assessment: Generalized weakness    Lower Extremity Assessment Lower Extremity Assessment: Generalized weakness    Cervical / Trunk Assessment Cervical / Trunk Assessment: Normal  Communication   Communication: No difficulties  Cognition Arousal/Alertness: Awake/alert Behavior During Therapy: WFL for tasks assessed/performed Overall Cognitive Status: Within Functional Limits for tasks assessed                                          General Comments      Exercises     Assessment/Plan    PT Assessment Patient needs continued PT services  PT Problem List Decreased strength;Decreased mobility;Decreased activity tolerance;Decreased balance       PT Treatment Interventions DME instruction;Therapeutic exercise;Gait training;Balance training;Stair training;Neuromuscular re-education;Therapeutic activities;Patient/family education;Functional mobility training    PT Goals (Current goals can be found in the Care Plan section)  Acute Rehab PT Goals Patient Stated Goal: to go home PT Goal Formulation: With patient Time For Goal Achievement: 09/20/21 Potential to Achieve Goals: Good    Frequency Min 2X/week     Co-evaluation               AM-PAC PT "6 Clicks" Mobility  Outcome Measure Help needed turning from your back to your side while in a flat bed without using bedrails?: A Little Help needed moving from lying on your back to sitting on the side of a flat bed without using bedrails?: A Little Help needed moving to and from a bed to a chair (including a wheelchair)?: A Little Help needed standing up from a chair using your arms (e.g., wheelchair or bedside chair)?: A Little Help needed to walk in hospital room?: A Little Help needed climbing 3-5 steps with a railing? : A Little 6 Click Score: 18    End of Session Equipment Utilized During  Treatment: Gait belt;Oxygen (5-6L) Activity Tolerance: Patient tolerated treatment well;Patient limited by fatigue Patient left: in chair;with chair alarm set;with call bell/phone within reach Nurse Communication: Mobility status PT Visit Diagnosis: Other abnormalities of gait and mobility (R26.89);Difficulty in walking, not elsewhere classified (R26.2);Muscle weakness (generalized) (M62.81)    Time: 4782-9562 PT Time Calculation (min) (ACUTE ONLY): 31 min   Charges:   PT Evaluation $PT Eval Low Complexity: 1 Low PT Treatments $Therapeutic Activity: 23-37 mins        Lieutenant Diego PT, DPT 1:18 PM,09/06/21

## 2021-09-07 DIAGNOSIS — A4150 Gram-negative sepsis, unspecified: Secondary | ICD-10-CM | POA: Diagnosis not present

## 2021-09-07 DIAGNOSIS — N390 Urinary tract infection, site not specified: Secondary | ICD-10-CM | POA: Diagnosis not present

## 2021-09-07 LAB — CULTURE, BLOOD (ROUTINE X 2)
Special Requests: ADEQUATE
Special Requests: ADEQUATE

## 2021-09-07 LAB — PHOSPHORUS: Phosphorus: 2.5 mg/dL (ref 2.5–4.6)

## 2021-09-07 LAB — MAGNESIUM: Magnesium: 1.8 mg/dL (ref 1.7–2.4)

## 2021-09-07 LAB — URINE CULTURE: Culture: >=100000 — AB

## 2021-09-07 MED ORDER — CEFAZOLIN SODIUM-DEXTROSE 2-4 GM/100ML-% IV SOLN
2.0000 g | Freq: Three times a day (TID) | INTRAVENOUS | Status: DC
Start: 2021-09-08 — End: 2021-09-09
  Administered 2021-09-08 – 2021-09-09 (×4): 2 g via INTRAVENOUS
  Filled 2021-09-07 (×5): qty 100

## 2021-09-07 NOTE — Progress Notes (Signed)
PROGRESS NOTE    Diana Cook  VVO:[id number] DOB: 04-05-1950 DOA: 09/04/2021 PCP: Abner Greenspan, MD    Brief Narrative:  71 year old with history of asthma, arthritis, COPD, paroxysmal A-fib on anticoagulation, seizure disorder and coronary artery disease, sick sinus syndrome status post pacemaker presents to the ER with urinary frequency and urgency, fever and chills, nausea vomiting and diarrhea for 1 day.  Headache and neck pain.  At the emergency room blood pressure was stable.  Tachypneic and tachycardic.  87% on 4 L oxygen.  CT angiogram of the chest abdomen pelvis without acute findings.  Admitted and treated with antibiotics and IV fluids.  E. coli bacteremia found.  Clinically improving.   Assessment & Plan:   Sepsis present on admission secondary to E. coli bacteremia from UTI. Blood cultures positive for E. coli, sensitivity pending Urine culture positive for gram-negative rods, sensitivity pending Some clinical improvement today.   On Rocephin.   Follow cultures for susceptibilities. CT scan abdomen pelvis with no evidence of hydronephrosis or obstruction. No postvoid residual.  Paroxysmal A-fib on anticoagulation, sick sinus syndrome status post pacemaker:  Pacemaker functioning well.  Heart rates controlled Therapeutic on Eliquis.   Rate controlled on Cardizem.  History of seizure: Stable on Keppra.  Depression: On Prozac.  Chronic hypoxemic respiratory failure: On 4 L oxygen at home.   Today on 4 to 6 L of oxygen.   Continue bronchodilator therapy.  Hypokalemia -resolved with replacement  Hypomagnesemia -resolved with replacement  Hypophosphatemia -resolved with replacement    DVT prophylaxis:  apixaban (ELIQUIS) tablet 5 mg   Code Status: Full code Family Communication: Husband and daughter at bedside 7/30 Disposition Plan: Status is: Inpatient Remains inpatient appropriate because: Treated for bacteremia on IV antibiotics     Consultants:   None  Procedures:  None  Antimicrobials:  Rocephin 7/12--   Subjective: Patient seen and examined, awake resting in bed.  She reported having return of chills and headache overnight also having some back to pain between her shoulder blades.  She does report improvement in her appetite.  Feels like her breathing is stable and denies wheezing or shortness of breath.  Objective: Vitals:   09/06/21 1616 09/06/21 2037 09/07/21 0513 09/07/21 0750  BP:  128/69 (!) 122/58 (!) 118/59  Pulse:  71 92 90  Resp:  16 20 20   Temp:  97.9 F (36.6 C) 99.9 F (37.7 C) 98.4 F (36.9 C)  TempSrc:  Oral Oral Oral  SpO2:  91% (!) 79% 90%  Weight: 87 kg     Height:        Intake/Output Summary (Last 24 hours) at 09/07/2021 1459 Last data filed at 09/07/2021 1100 Gross per 24 hour  Intake 981.74 ml  Output 1400 ml  Net -418.26 ml   Filed Weights   09/04/21 2042 09/06/21 1616  Weight: 86.2 kg 87 kg    Examination: General exam: awake, alert, no acute distress HEENT: Nasal cannula in place, moist mucus membranes, hearing grossly normal  Respiratory system: CTAB, no wheezes, on 6 L/min HFNC O2, normal respiratory effort at rest. Cardiovascular system: normal S1/S2, RRR, no pedal edema.   Gastrointestinal system: soft, nontender nondistended Central nervous system: A&O x3. no gross focal neurologic deficits, normal speech Extremities: moves all, no edema, normal tone Skin: dry, intact, normal temperature Psychiatry: normal mood, congruent affect, judgement and insight appear normal     Data Reviewed: I have personally reviewed following labs and imaging studies  Notable labs and  data: -- BMP with sodium 134, potassium normalized 4.3, bicarb 20, glucose 116, calcium 8.8, anion gap 3, phosphorus improved from 1.8 up to 2.5, magnesium improved from 1.6 up to 1.8 --CBC with stable hemoglobin 10.2, normal platelets and white count --Urine culture with E. Coli -- Blood culture with E. coli  and Enterobacter           LOS: 2 days    Time spent: 35 minutes    Ezekiel Slocumb, DO [facility] Pager [phone]

## 2021-09-08 ENCOUNTER — Inpatient Hospital Stay: Payer: Medicare Other

## 2021-09-08 DIAGNOSIS — N390 Urinary tract infection, site not specified: Secondary | ICD-10-CM | POA: Diagnosis not present

## 2021-09-08 DIAGNOSIS — A4150 Gram-negative sepsis, unspecified: Secondary | ICD-10-CM | POA: Diagnosis not present

## 2021-09-08 LAB — BASIC METABOLIC PANEL
Anion gap: 5 (ref 5–15)
BUN: 12 mg/dL (ref 8–23)
CO2: 25 mmol/L (ref 22–32)
Calcium: 9.3 mg/dL (ref 8.9–10.3)
Chloride: 103 mmol/L (ref 98–111)
Creatinine, Ser: 0.77 mg/dL (ref 0.44–1.00)
GFR, Estimated: >60 mL/min (ref >60)
Glucose, Bld: 107 mg/dL — ABNORMAL HIGH (ref 70–99)
Potassium: 4.5 mmol/L (ref 3.5–5.1)
Sodium: 133 mmol/L — ABNORMAL LOW (ref 135–145)

## 2021-09-08 LAB — PHOSPHORUS: Phosphorus: 3.3 mg/dL (ref 2.5–4.6)

## 2021-09-08 LAB — CBC
HCT: 32.6 % — ABNORMAL LOW (ref 36.0–46.0)
Hemoglobin: 10.7 g/dL — ABNORMAL LOW (ref 12.0–15.0)
MCH: 29.2 pg (ref 26.0–34.0)
MCHC: 32.8 g/dL (ref 30.0–36.0)
MCV: 88.8 fL (ref 80.0–100.0)
Platelets: 216 10*3/uL (ref 150–400)
RBC: 3.67 MIL/uL — ABNORMAL LOW (ref 3.87–5.11)
RDW: 12.8 % (ref 11.5–15.5)
WBC: 6.2 10*3/uL (ref 4.0–10.5)
nRBC: 0 % (ref 0.0–0.2)

## 2021-09-08 LAB — MAGNESIUM: Magnesium: 2 mg/dL (ref 1.7–2.4)

## 2021-09-08 MED ORDER — MENTHOL 3 MG MT LOZG
1.0000 | LOZENGE | OROMUCOSAL | Status: DC | PRN
  Administered 2021-09-08: 3 mg via ORAL
  Filled 2021-09-08: qty 9

## 2021-09-08 MED ORDER — FUROSEMIDE 10 MG/ML IJ SOLN
40.0000 mg | Freq: Once | INTRAMUSCULAR | Status: AC
  Administered 2021-09-08: 40 mg via INTRAVENOUS
  Filled 2021-09-08: qty 4

## 2021-09-08 NOTE — Progress Notes (Addendum)
Dr. Arbutus Ped placed order to transition patient to either baseline O2, if does not tolerate, place on facemask.  Patient O2 saturation with HFNC at 4L - 92%.  Transitioned to regular flow [place] 4L, saturation at 92%, patient at rest and sitting high in bed.  Activities with moderate exertion drops saturation to 86%, without any distress.  Denies SOB, no complaints offered.  Will keep regular flow [place] at 4L at this time, will monitor.  Son at bedside.

## 2021-09-08 NOTE — TOC Initial Note (Signed)
Transition of Care [facility]) - Initial/Assessment Note    Patient Details  Name: Diana Cook MRN: [id number] Date of Birth: Jun 26, 1950  [facility]) CM/SW Contact:    Margarito Liner, LCSW Phone Number: 09/08/2021, 11:59 AM  Clinical Narrative:  CSW met with patient. Son at bedside. CSW introduced role and explained that discharge planning be discussed. Per attending, potential discharge tomorrow if she can be weaned down to her home oxygen requirements. Patient is on 4 L chronic which is provided through [employer]. She is active with pulmonary rehab and PT is recommending that she resume that at discharge. DME recommendations for RW and 3-in-1. She thinks she has some from her mother and will notify TOC if she needs it. Her husband will likely transport her home. No further concerns. CSW encouraged patient and her son to contact CSW as needed. CSW will continue to follow patient for support and facilitate return home when stable.                Expected Discharge Plan: OP Rehab Barriers to Discharge: Continued Medical Work up   Patient Goals and CMS Choice        Expected Discharge Plan and Services Expected Discharge Plan: OP Rehab     Post Acute Care Choice: Resumption of Svcs/PTA Provider Living arrangements for the past 2 months: Single Family Home                                      Prior Living Arrangements/Services Living arrangements for the past 2 months: Single Family Home Lives with:: Spouse Patient language and need for interpreter reviewed:: Yes Do you feel safe going back to the place where you live?: Yes      Need for Family Participation in Patient Care: Yes (Comment) Care giver support system in place?: Yes (comment) Current home services: DME Criminal Activity/Legal Involvement Pertinent to Current Situation/Hospitalization: No - Comment as needed  Activities of Daily Living Home Assistive Devices/Equipment: Oxygen, Walker (specify  type) ADL Screening (condition at time of admission) Patient's cognitive ability adequate to safely complete daily activities?: Yes Is the patient deaf or have difficulty hearing?: No Does the patient have difficulty seeing, even when wearing glasses/contacts?: No Does the patient have difficulty concentrating, remembering, or making decisions?: No Patient able to express need for assistance with ADLs?: Yes Does the patient have difficulty dressing or bathing?: No Independently performs ADLs?: Yes (appropriate for developmental age) Does the patient have difficulty walking or climbing stairs?: Yes Weakness of Legs: Both Weakness of Arms/Hands: None  Permission Sought/Granted Permission sought to share information with : Family Supports Permission granted to share information with : Yes, Verbal Permission Granted  Share Information with NAME: Diamone Whistler     Permission granted to share info w Relationship: Son     Emotional Assessment Appearance:: Appears stated age Attitude/Demeanor/Rapport: Engaged, Gracious Affect (typically observed): Accepting, Appropriate, Calm, Pleasant Orientation: : Oriented to Self, Oriented to Place, Oriented to  Time, Oriented to Situation Alcohol / Substance Use: Not Applicable Psych Involvement: No (comment)  Admission diagnosis:  Enteritis [K52.9] Atypical chest pain [R07.89] Acute cystitis without hematuria [N30.00] Sepsis due to gram-negative UTI (HCC) [A41.50, N39.0] Sepsis, due to unspecified organism, unspecified whether acute organ dysfunction present [facility]) [A41.9] Patient Active Problem List   Diagnosis Date Noted   Sepsis due to gram-negative UTI (HCC) 09/05/2021   Dyslipidemia 09/05/2021  Depression 09/05/2021   GERD without esophagitis 09/05/2021   Vitamin D deficiency 08/20/2021   Colon cancer screening 07/30/2021   Hypercalcemia 07/29/2021   Diarrhea 07/29/2021   UTI (urinary tract infection) 07/17/2021   AKI (acute kidney  injury) ([place]) 07/17/2021   Dysuria 07/08/2021   Hypotension 07/08/2021   Paroxysmal atrial fibrillation (HCC) 04/11/2021   Current use of proton pump inhibitor 10/24/2020   Sleep apnea 10/24/2020   Fatigue 10/24/2020   Orthostatic lightheadedness 05/16/2020   Radicular pain in left arm 03/02/2020   Neck pain on left side 02/15/2020   Paresthesia 02/15/2020   Chronic respiratory failure with hypoxia ([place]) 11/17/2019   Chronic heart failure with preserved ejection fraction (HFpEF) ([place]) 08/11/2019   Coronary artery disease involving native coronary artery of native heart without angina pectoris 06/20/2019   Gastroesophageal reflux disease    Stomach irritation    Abnormal CT scan, esophagus    Polyp of ascending colon    Personal history of tobacco use, presenting hazards to health 01/18/2018   Grade I diastolic dysfunction 16/11/9602   Globus pharyngeus 12/21/2017   Chronic obstructive pulmonary disease ([place]) 12/15/2017   Hyperlipidemia LDL goal <70 12/07/2017   Seizures ([place]) 10/01/2016   Elevated glucose 08/31/2016   Grief reaction 04/11/2016   Malignant neoplasm of upper-outer quadrant of right breast in female, estrogen receptor positive ([place]) 12/11/2015   Osteoporosis 11/22/2015   Estrogen deficiency 09/07/2015   Bruising 09/07/2015   Wrinkles 09/07/2015   Post herpetic neuralgia 09/01/2014   Encounter for Medicare annual wellness exam 07/11/2014   Routine general medical examination at a health care facility 06/26/2013   H/O small bowel obstruction 04/15/2013   Dyspnea on exertion 02/10/2013   Complete heart block ([place]) 11/03/2011   Pacemaker-[employer] 11/03/2011   Lumbar spinal stenosis 09/02/2011   POSTMENOPAUSAL STATUS 08/06/2009   HIDRADENITIS SUPPURATIVA 06/26/2008   HERNIATED DISC 12/14/2007   BACK, LOWER, PAIN 12/02/2007   Hyperkalemia 08/06/2007   HX, PERSONAL, COLONIC POLYPS 09/14/2006   Former smoker 06/10/2006   ASTHMA 06/10/2006   H/O idiopathic  seizure 06/10/2006   PCP:  Abner Greenspan, MD Pharmacy:   [facility] [address] [place] [address] Phone: [phone] Fax: [phone]     Social Determinants of Health (SDOH) Interventions    Readmission Risk Interventions     No data to display

## 2021-09-08 NOTE — Progress Notes (Signed)
PROGRESS NOTE    Diana Cook  VHQ:[id number] DOB: 1950/07/18 DOA: 09/04/2021 PCP: Abner Greenspan, MD    Brief Narrative:  71 year old with history of asthma, arthritis, COPD, paroxysmal A-fib on anticoagulation, seizure disorder and coronary artery disease, sick sinus syndrome status post pacemaker presents to the ER with urinary frequency and urgency, fever and chills, nausea vomiting and diarrhea for 1 day.  Headache and neck pain.  At the emergency room blood pressure was stable.  Tachypneic and tachycardic.  87% on 4 L oxygen.  CT angiogram of the chest abdomen pelvis without acute findings.  Admitted and treated with antibiotics and IV fluids.  E. coli bacteremia found.  Clinically improving.   Assessment & Plan:   Sepsis present on admission secondary to E. coli bacteremia from UTI. Blood cultures positive for E. coli, sensitivity pending Urine culture positive for gram-negative rods, sensitivity pending Some clinical improvement today.   On Rocephin.   Follow cultures for susceptibilities. CT scan abdomen pelvis with no evidence of hydronephrosis or obstruction. No postvoid residual.  Paroxysmal A-fib on anticoagulation, sick sinus syndrome status post pacemaker:  Pacemaker functioning well.  Heart rates controlled Therapeutic on Eliquis.   Rate controlled on Cardizem.  History of seizure: Stable on Keppra.  Depression: On Prozac.  Acute on chronic hypoxemic respiratory failure: On 4 L oxygen at home.   Today 6 L high flow [place] Chest x-ray today with progressive interstitial opacities throughout both lungs slightly worse on the right felt to represent pulmonary edema versus atypical/viral infection. Echo in May with normal EF, grade 1 diastolic dysfunction. --Single dose IV Lasix 40 mg today & monitor response --Attempt to wean to baseline 4 L --Continue bronchodilator therapy.  Hypokalemia -resolved with replacement  Hypomagnesemia -resolved with  replacement  Hypophosphatemia -resolved with replacement    DVT prophylaxis:  apixaban (ELIQUIS) tablet 5 mg   Code Status: Full code Family Communication: Husband and daughter at bedside 7/30 Disposition Plan: Status is: Inpatient Remains inpatient appropriate because: O2 requirement above baseline, attempting to wean, remains on IV antibiotics for bacteremia and UTI     Consultants:  None  Procedures:  None  Antimicrobials:  Rocephin 7/12--7/16 Ancef 7/16 --   Subjective: Patient was awake resting in bed when seen on rounds.  She reports congestion and not sure if she received Claritin and Flonase this morning.  I looked and she had been given them an hour before but does not recall.  She reports that her O2 sats at baseline are usually in the mid upper 80s on her 4 L.  Feels like her breathing is near baseline but asked to work with PT again to see how she feels on exertion.  Objective: Vitals:   09/08/21 0756 09/08/21 1205 09/08/21 1224 09/08/21 1231  BP: 123/64     Pulse: 67     Resp: 20     Temp: 98 F (36.7 C)     TempSrc: Oral     SpO2: (!) 86% 92% 92% 91%  Weight:      Height:        Intake/Output Summary (Last 24 hours) at 09/08/2021 1354 Last data filed at 09/08/2021 1017 Gross per 24 hour  Intake 840 ml  Output 2400 ml  Net -1560 ml   Filed Weights   09/04/21 2042 09/06/21 1616  Weight: 86.2 kg 87 kg    Examination: General exam: awake, alert, no acute distress HEENT: Wearing high flow nasal cannula, moist mucus membranes, hearing grossly  normal  Respiratory system: Lungs clear but with shallow inspirations, no expiratory wheezes on 6 L/min HFNC O2.  Normal respiratory effort at rest Cardiovascular system: No peripheral edema.   Central nervous system: A&O x3. no gross focal neurologic deficits, normal speech Extremities: moves all, no edema, normal tone Skin: dry, intact, normal temperature Psychiatry: normal mood, congruent affect, judgement  and insight appear normal     Data Reviewed: I have personally reviewed following labs and imaging studies  Notable labs and data: -- BMP with sodium 133, glucose 107, potassium magnesium and phosphorus normalized -- CBC with stable hemoglobin 10.7, normal platelets and white count -- Urine culture with E. Coli resistant to ampicillin, intermediate to Unasyn otherwise pansensitive -- Blood culture same as urine culture      LOS: 3 days    Time spent: 35 minutes    Ezekiel Slocumb, DO [facility] Pager [phone]

## 2021-09-08 NOTE — Progress Notes (Signed)
Patient is tolerating 4L via [place], saturating 88-91%.  Education on pulmonary exercises and importance of sitting up and mobility given to patient.  Patient verbalized understanding.  No complaints offered at this time.

## 2021-09-09 ENCOUNTER — Encounter: Payer: Self-pay | Admitting: Internal Medicine

## 2021-09-09 LAB — BASIC METABOLIC PANEL
Anion gap: 8 (ref 5–15)
BUN: 21 mg/dL (ref 8–23)
CO2: 24 mmol/L (ref 22–32)
Calcium: 9.2 mg/dL (ref 8.9–10.3)
Chloride: 100 mmol/L (ref 98–111)
Creatinine, Ser: 0.95 mg/dL (ref 0.44–1.00)
GFR, Estimated: >60 mL/min (ref >60)
Glucose, Bld: 118 mg/dL — ABNORMAL HIGH (ref 70–99)
Potassium: 3.9 mmol/L (ref 3.5–5.1)
Sodium: 132 mmol/L — ABNORMAL LOW (ref 135–145)

## 2021-09-09 MED ORDER — ENSURE ENLIVE PO LIQD: 237.0000 mL | Freq: Three times a day (TID) | ORAL | 12 refills | Status: DC

## 2021-09-09 MED ORDER — FUROSEMIDE 40 MG PO TABS: 40.0000 mg | ORAL_TABLET | Freq: Every day | ORAL | 0 refills | Status: DC | PRN

## 2021-09-09 MED ORDER — CEFADROXIL 500 MG PO CAPS
1000.0000 mg | ORAL_CAPSULE | Freq: Two times a day (BID) | ORAL | 0 refills | Status: AC
Start: 2021-09-09 — End: 2021-09-12

## 2021-09-09 MED ORDER — FLUTICASONE PROPIONATE 50 MCG/ACT NA SUSP: 1.0000 | Freq: Every day | NASAL | 2 refills | Status: DC

## 2021-09-09 MED ORDER — ADULT MULTIVITAMIN W/MINERALS CH: 1.0000 | ORAL_TABLET | Freq: Every day | ORAL | Status: DC

## 2021-09-09 MED ORDER — LORATADINE 10 MG PO TABS: 10.0000 mg | ORAL_TABLET | Freq: Every day | ORAL | Status: DC

## 2021-09-09 MED ORDER — CEFADROXIL 500 MG PO CAPS: 500.0000 mg | ORAL_CAPSULE | Freq: Two times a day (BID) | ORAL | 0 refills | Status: DC

## 2021-09-09 NOTE — TOC Transition Note (Signed)
Transition of Care [facility]) - CM/SW Discharge Note   Patient Details  Name: Diana Cook MRN: [id number] Date of Birth: Jan 22, 1951  [facility] CM/SW Contact:  Beverly Sessions, RN Phone Number: 09/09/2021, 11:09 AM   Clinical Narrative:     Patient to be discharged today Husband to transport and bring Portable O2  Patient confirms she needs  RW and BSC. Referral made to ronda with [employer] and to be delivered to room prior to dc     Barriers to Discharge: Continued Medical Work up   Patient Goals and CMS Choice        Discharge Placement                       Discharge Plan and Services     Post Acute Care Choice: Resumption of Svcs/PTA Provider                               Social Determinants of Health (SDOH) Interventions     Readmission Risk Interventions     No data to display

## 2021-09-09 NOTE — Discharge Summary (Signed)
Physician Discharge Summary   Patient: Diana Cook MRN: [id number] DOB: 1951-01-24  Admit date:     09/04/2021  Discharge date: 09/09/2021  Discharge Physician: Ezekiel Slocumb   PCP: Abner Greenspan, MD   Recommendations at discharge:    Follow up with Primary Care in 1-2 weeks Repeat CBC, BMP, Mg in 1-2 weeks Follow up with Cardiology, Pulmonology as scheduled or sooner if needed   Discharge Diagnoses: Principal Problem:   Sepsis due to gram-negative UTI [facility]) Active Problems:   Paroxysmal atrial fibrillation (HCC)   Dyslipidemia   Depression   GERD without esophagitis   Hospital Course: 71 year old with history of asthma, arthritis, COPD, paroxysmal A-fib on anticoagulation, seizure disorder and coronary artery disease, sick sinus syndrome status post pacemaker presents to the ER with urinary frequency and urgency, fever and chills, nausea vomiting and diarrhea for 1 day.  Headache and neck pain.  At the emergency room blood pressure was stable.  Tachypneic and tachycardic.  87% on 4 L oxygen.  CT angiogram of the chest abdomen pelvis without acute findings.  Admitted and treated with antibiotics and IV fluids.  E. coli bacteremia found.    Clinically improved and now medically stable for discharge.   Assessment and Plan:  Sepsis present on admission secondary to E. coli bacteremia from UTI. Blood cultures positive for E. coli, sensitivity pending Urine culture positive for gram-negative rods, sensitivity pending Some clinical improvement today.   Treated w Rocephin.   Cultures came back only resistant to ampicillin and Unsyn (intermediate). CT scan abdomen pelvis with no evidence of hydronephrosis or obstruction. No postvoid residual. Discharge on cefadroxil to complete course of antibiotics.    Paroxysmal A-fib on anticoagulation, sick sinus syndrome status post pacemaker:  Pacemaker functioning well.  Heart rates controlled Therapeutic on Eliquis.   Rate controlled  on Cardizem.   History of seizure: Stable on Keppra.   Depression: On Prozac.   Acute on chronic hypoxemic respiratory failure: On 4 L oxygen at home.  COPD / Emphysema  Today 6 L high flow [place] Chest x-ray today with progressive interstitial opacities throughout both lungs slightly worse on the right felt to represent pulmonary edema versus atypical/viral infection. Echo in May with normal EF, grade 1 diastolic dysfunction. --Single dose IV Lasix 40 mg given yesterday  --Now stable on baseline 4 L --Continue bronchodilator therapy. --Follow up with pulmonology outpatient   Hypokalemia -resolved with replacement   Hypomagnesemia -resolved with replacement   Hypophosphatemia -resolved with replacement       Consultants: None Procedures performed: None   Disposition: Home Diet recommendation:  Regular diet DISCHARGE MEDICATION: Allergies as of 09/09/2021       Reactions   Codeine Nausea And Vomiting   Morphine And Related Nausea Only        Medication List     TAKE these medications    acetaminophen 500 MG tablet Commonly known as: TYLENOL Take 500-1,000 mg by mouth every 6 (six) hours as needed for mild pain or headache.   albuterol 108 (90 Base) MCG/ACT inhaler Commonly known as: VENTOLIN HFA Inhale 2 puffs into the lungs every 4 (four) hours as needed for wheezing or shortness of breath.   atorvastatin 40 MG tablet Commonly known as: LIPITOR Take 1 tablet by mouth once daily   benzonatate 100 MG capsule Commonly known as: TESSALON Take by mouth as needed.   Eliquis 5 MG Tabs tablet Generic drug: apixaban Take 1 tablet by mouth twice daily  feeding supplement Liqd Take 237 mLs by mouth 3 (three) times daily between meals.   FLUoxetine 20 MG capsule Commonly known as: PROZAC Take 1 capsule by mouth once daily   fluticasone 50 MCG/ACT nasal spray Commonly known as: FLONASE Place 1 spray into both nostrils daily.   furosemide 40 MG  tablet Commonly known as: LASIX Take 1 tablet (40 mg total) by mouth daily as needed (swelling, wt gain or needing more oxygen). What changed: reasons to take this   levETIRAcetam 250 MG tablet Commonly known as: KEPPRA Take 250 mg by mouth 2 (two) times daily.   loratadine 10 MG tablet Commonly known as: CLARITIN Take 1 tablet (10 mg total) by mouth daily.   multivitamin with minerals Tabs tablet Take 1 tablet by mouth daily.   omeprazole 40 MG capsule Commonly known as: PRILOSEC Take 40 mg by mouth daily.   OXYGEN Inhale 3 L into the lungs at bedtime.   Stiolto Respimat 2.5-2.5 MCG/ACT Aers Generic drug: Tiotropium Bromide-Olodaterol INHALE 2 PUFFS BY MOUTH ONCE DAILY   tretinoin 0.1 % cream Commonly known as: RETIN-A Apply topically at bedtime.   VITAMIN D PO Take 1 tablet by mouth daily.   zolpidem 10 MG tablet Commonly known as: AMBIEN TAKE 1/2 TO 1 (ONE-HALF TO ONE) TABLET BY MOUTH AT BEDTIME AS NEEDED FOR SLEEP       ASK your doctor about these medications    cefadroxil 500 MG capsule Commonly known as: DURICEF Take 2 capsules (1,000 mg total) by mouth 2 (two) times daily for 3 days. Ask about: Should I take this medication?        Discharge Exam: Filed Weights   09/04/21 2042 09/06/21 1616  Weight: 86.2 kg 87 kg   General exam: awake, alert, no acute distress HEENT: atraumatic, clear conjunctiva, anicteric sclera,moist mucus membranes, hearing grossly normal  Respiratory system: CTAB but diminished throughout, on 4 L/min [place] O2, no wheezes, rales or rhonchi, normal respiratory effort at rest. Cardiovascular system: normal S1/S2, RRR, no pedal edema.   Gastrointestinal system: soft, NT, ND, no HSM felt, +bowel sounds. Central nervous system: A&O x3. no gross focal neurologic deficits, normal speech Extremities: moves all, no edema, normal tone Skin: dry, intact, normal temperature Psychiatry: normal mood, congruent affect, judgement and insight  appear normal   Condition at discharge: stable  The results of significant diagnostics from this hospitalization (including imaging, microbiology, ancillary and laboratory) are listed below for reference.   Imaging Studies: CUP PACEART REMOTE DEVICE CHECK  Result Date: 09/10/2021 Scheduled remote reviewed. Normal device function.  Brief ATR's from 7/14, all <36sec.  Hx of PAF, +OAC PMT events appropriately treated and terminated Recent increase in respiratory rate,=24 with decrease in activity Next remote 91 days. [place]  DG Chest Port 1 View  Result Date: 09/08/2021 CLINICAL DATA:  Hypoxia EXAM: PORTABLE CHEST 1 VIEW COMPARISON:  09/04/2021 FINDINGS: Left-sided implanted cardiac device. Stable cardiomediastinal contours. Aortic atherosclerosis. Progressive interstitial opacities throughout both lungs, right slightly worse than left. Streaky bibasilar atelectasis. Possible trace bilateral pleural effusions. No pneumothorax. IMPRESSION: Progressive interstitial opacities throughout both lungs, right slightly worse than left. Findings may represent pulmonary edema versus atypical/viral infection. Electronically Signed   By: Davina Poke D.O.   On: 09/08/2021 09:37   CT Angio Chest/Abd/Pel for Dissection W and/or Wo Contrast  Result Date: 09/04/2021 CLINICAL DATA:  Chest and back pain diarrhea vomiting EXAM: CT ANGIOGRAPHY CHEST, ABDOMEN AND PELVIS TECHNIQUE: Non-contrast CT of the chest was initially obtained.  Multidetector CT imaging through the chest, abdomen and pelvis was performed using the standard protocol during bolus administration of intravenous contrast. Multiplanar reconstructed images and MIPs were obtained and reviewed to evaluate the vascular anatomy. RADIATION DOSE REDUCTION: This exam was performed according to the departmental dose-optimization program which includes automated exposure control, adjustment of the mA and/or kV according to patient size and/or use of iterative  reconstruction technique. CONTRAST:  119mL OMNIPAQUE IOHEXOL 350 MG/ML SOLN COMPARISON:  Chest x-ray 09/04/2021, CT chest 09/02/2021, PET CT 01/29/2021 FINDINGS: CTA CHEST FINDINGS Cardiovascular: Non contrasted images of the chest demonstrate no acute intramural hematoma. Moderate aortic atherosclerosis. No aneurysm. Left-sided pacing device with lead in the right ventricle. Normal cardiac size. Trace pericardial effusion. No dissection. Small penetrating ulcer or ulcerated plaque at the anterior distal descending thoracic aorta, series 5, image 83. Mediastinum/Nodes: Midline trachea. 1.5 cm nodule in the left lobe of thyroid, series 5, image 11. No suspicious lymph nodes. Esophagus within normal limits. Lungs/Pleura: Emphysema. No acute consolidation, pleural effusion, or pneumothorax. Redemonstrated spiculated right upper lobe pulmonary nodule measuring 9 mm, series 6, image 71. Other pulmonary nodules are unchanged, including small spiculated peripheral left lower lobe pulmonary nodule, measuring 6 mm, series 6, image 96. Musculoskeletal: Sternum is intact.  No acute osseous abnormality. Review of the MIP images confirms the above findings. CTA ABDOMEN AND PELVIS FINDINGS VASCULAR Aorta: Normal caliber aorta without aneurysm, dissection, vasculitis or significant stenosis. Moderate aortic atherosclerosis. Celiac: Mild stenosis at the origin of the celiac trunk. Distal vascular patency and no aneurysm, dissection or occlusion SMA: Patent without evidence of aneurysm, dissection, vasculitis or significant stenosis. Renals: Both renal arteries are patent without evidence of aneurysm, dissection, vasculitis, fibromuscular dysplasia or significant stenosis. IMA: Patent without evidence of aneurysm, dissection, vasculitis or significant stenosis. Inflow: Patent without evidence of aneurysm, dissection, vasculitis or significant stenosis. Mild to moderate atherosclerosis. Review of the MIP images confirms the above  findings. NON-VASCULAR Hepatobiliary: Subcentimeter hypodensity in the liver too small to further characterize. Suspect hepatic steatosis. No calcified gallstone or biliary dilatation Pancreas: Unremarkable. No pancreatic ductal dilatation or surrounding inflammatory changes. Spleen: Normal in size without focal abnormality. Adrenals/Urinary Tract: Adrenal glands are thickened but without dominant nodule. Cortical scarring in the mid to upper left kidney. No hydronephrosis. No ureteral stone. Small amount of air in the bladder. Stomach/Bowel: The stomach is nonenlarged. Diffuse fluid-filled small and large bowel. Fluid-filled cecum extends into the pelvis. The appendix is not well seen. Lymphatic: No suspicious lymph nodes Reproductive: Status post hysterectomy. No adnexal masses. Other: Negative for pelvic effusion or free air. Musculoskeletal: No acute osseous abnormality. Review of the MIP images confirms the above findings. IMPRESSION: 1. Negative for acute aortic dissection.  No aneurysm. 2. Moderate aortic atherosclerosis without high-grade stenosis or occlusive disease within the abdomen or pelvis. Small penetrating ulcer or ulcerated plaque at the distal descending thoracic aorta. 3. Emphysema. Redemonstrated bilateral pulmonary nodules including 9 mm spiculated right upper lobe pulmonary nodule. Consider one of the following in 3 months for both low-risk and high-risk individuals: (a) repeat chest CT, (b) follow-up PET-CT, or (c) tissue sampling. This recommendation follows the consensus statement: Guidelines for Management of Incidental Pulmonary Nodules Detected on CT Images: From the Fleischner Society 2017; Radiology 2017; 284:228-243. 4. Diffuse fluid-filled small and large bowel without obstruction or wall thickening, findings are suggestive of enteritis 5. 1.5 cm nodule in the left lobe of the thyroid. Recommend thyroid US (ref: J Am Coll Radiol. 2015 Feb;12(2): 143-50).  This should be performed non  emergently Electronically Signed   By: Donavan Foil M.D.   On: 09/04/2021 23:05   DG Chest 2 View  Result Date: 09/04/2021 CLINICAL DATA:  Sepsis EXAM: CHEST - 2 VIEW COMPARISON:  CT chest dated 09/02/2021 FINDINGS: Mild left basilar scarring/atelectasis. Right lung is clear No pleural effusion or pneumothorax. The heart is normal size.  Left subclavian pacemaker. Visualized osseous structures are within normal limits. IMPRESSION: Mild left basilar scarring/atelectasis. Electronically Signed   By: Julian Hy M.D.   On: 09/04/2021 21:45   CT SUPER D CHEST WO [place] PILOT  Result Date: 09/03/2021 CLINICAL DATA:  Left lung nodule. EXAM: CT CHEST WITHOUT CONTRAST TECHNIQUE: Multidetector CT imaging of the chest was performed using thin slice collimation for electromagnetic bronchoscopy planning purposes, without intravenous contrast. RADIATION DOSE REDUCTION: This exam was performed according to the departmental dose-optimization program which includes automated exposure control, adjustment of the mA and/or kV according to patient size and/or use of iterative reconstruction technique. COMPARISON:  01/10/2021 FINDINGS: Cardiovascular: There is a left chest wall pacer device with leads in the right atrial appendage and right ventricle. Heart size appears within normal limits. No pericardial effusion. Aortic atherosclerosis and coronary artery calcifications. Mediastinum/Nodes: No enlarged mediastinal, hilar, or axillary lymph nodes. Thyroid gland, trachea, and esophagus demonstrate no significant findings. Lungs/Pleura: Moderate centrilobular and paraseptal emphysema. No pleural effusion, airspace consolidation or atelectasis. Chronic subpleural scarring and volume loss within the posteromedial right lung base is unchanged, image 132/3. The nodule within the periphery of the left lower lobe is again noted. This measures 6 mm, image 106/3. On the CT from 01/10/2021 this measured 9 mm (when remeasured.).  Nodule within superior segment of the left lower lobe measures 3 mm, image 61/3. Previously this measured 5 mm. Nodule within the lingula is unchanged measuring 4 mm, image 114/3. Within the periphery of the right upper lobe there is a 9 mm lung nodule, image 78/3. This is a new finding when compared with the previous exam and has mild peripheral spiculation. Upper Abdomen: No acute abnormality. Too small to characterize low-density structure within the periphery of the right hepatic lobe measures 6 mm. Musculoskeletal: No chest wall mass or suspicious bone lesions identified. IMPRESSION: 1. There is a new 9 mm lung nodule within the periphery of the right upper lobe with mild peripheral spiculation. Consider one of the following in 3 months for both low-risk and high-risk individuals: (a) repeat chest CT, (b) follow-up PET-CT, or (c) tissue sampling. This recommendation follows the consensus statement: Guidelines for Management of Incidental Pulmonary Nodules Detected on CT Images: From the Fleischner Society 2017; Radiology 2017; 284:228-243. 2. The nodule of concern within the left lower lobe is stable to decreased in size in the interval. This measures 6 mm on today's study compared with 9 mm previously (when remeasured) 3. Additional small lung nodules in the lingula and left upper lobe are stable in the interval. 4. Aortic Atherosclerosis (ICD10-I70.0) and Emphysema (ICD10-J43.9). Electronically Signed   By: Kerby Moors M.D.   On: 09/03/2021 17:00    Microbiology: Results for orders placed or performed during the hospital encounter of 09/04/21  Culture, blood (Routine x 2)     Status: Abnormal   Collection Time: 09/04/21  9:07 PM   Specimen: BLOOD  Result Value Ref Range Status   Specimen Description   Final    BLOOD RIGHT FOREARM Performed at [facility], [address] [place], [place] [address]  Special Requests   Final    BOTTLES DRAWN AEROBIC AND ANAEROBIC Blood Culture  adequate volume Performed at [facility], [address] [place], [place] [address]    Culture  Setup Time   Final    GRAM NEGATIVE RODS AEROBIC BOTTLE ONLY CRITICAL VALUE NOTED.  VALUE IS CONSISTENT WITH PREVIOUSLY REPORTED AND CALLED VALUE. Performed at [facility], [address] [place], [place] [address]    Culture (A)  Final    ESCHERICHIA COLI SUSCEPTIBILITIES PERFORMED ON PREVIOUS CULTURE WITHIN THE LAST 5 DAYS. Performed at [facility], [place] [address], [place], [place] [address]    Report Status 09/07/2021 FINAL  Final  SARS Coronavirus 2 by RT PCR (hospital order, performed in [facility] hospital lab) *cepheid single result test* Anterior Nasal Swab     Status: None   Collection Time: 09/04/21  9:07 PM   Specimen: Anterior Nasal Swab  Result Value Ref Range Status   SARS Coronavirus 2 by RT PCR NEGATIVE NEGATIVE Final    Comment: (NOTE) SARS-CoV-2 target nucleic acids are NOT DETECTED.  The SARS-CoV-2 RNA is generally detectable in upper and lower respiratory specimens during the acute phase of infection. The lowest concentration of SARS-CoV-2 viral copies this assay can detect is 250 copies / mL. A negative result does not preclude SARS-CoV-2 infection and should not be used as the sole basis for treatment or other patient management decisions.  A negative result may occur with improper specimen collection / handling, submission of specimen other than nasopharyngeal swab, presence of viral mutation(s) within the areas targeted by this assay, and inadequate number of viral copies (<250 copies / mL). A negative result must be combined with clinical observations, patient history, and epidemiological information.  Fact Sheet for Patients:   https://www.patel.info/  Fact Sheet for Healthcare Providers: https://[place].com/  This test is not yet approved or  cleared by the [place] FDA and has  been authorized for detection and/or diagnosis of SARS-CoV-2 by FDA under an Emergency Use Authorization (EUA).  This EUA will remain in effect (meaning this test can be used) for the duration of the COVID-19 declaration under Section 564(b)(1) of the Act, 21 U.S.C. section 360bbb-3(b)(1), unless the authorization is terminated or revoked sooner.  Performed at [facility], [address] [place], [place] [address]   Culture, blood (Routine x 2)     Status: Abnormal   Collection Time: 09/04/21 10:36 PM   Specimen: BLOOD  Result Value Ref Range Status   Specimen Description   Final    BLOOD LEFT HAND Performed at [facility], [address] [place], [place] [address]    Special Requests   Final    BOTTLES DRAWN AEROBIC AND ANAEROBIC Blood Culture adequate volume Performed at [facility], [address] [place], [place] [address]    Culture  Setup Time   Final    Organism ID to follow GRAM NEGATIVE RODS IN BOTH AEROBIC AND ANAEROBIC BOTTLES CRITICAL RESULT CALLED TO, READ BACK BY AND VERIFIED WITH: Eleonore Chiquito 09/05/2021 @0937  Performed at [facility], [address] [place], [place] [address]    Culture ESCHERICHIA COLI (A)  Final   Report Status 09/07/2021 FINAL  Final   Organism ID, Bacteria ESCHERICHIA COLI  Final      Susceptibility   Escherichia coli - MIC*    AMPICILLIN >=32 RESISTANT Resistant     CEFAZOLIN <=4 SENSITIVE Sensitive     CEFEPIME <=0.12  SENSITIVE Sensitive     CEFTAZIDIME <=1 SENSITIVE Sensitive     CEFTRIAXONE <=0.25 SENSITIVE Sensitive     CIPROFLOXACIN <=0.25 SENSITIVE Sensitive     GENTAMICIN <=1 SENSITIVE Sensitive     IMIPENEM <=0.25 SENSITIVE Sensitive     TRIMETH/SULFA <=20 SENSITIVE Sensitive     AMPICILLIN/SULBACTAM 16 INTERMEDIATE Intermediate     PIP/TAZO <=4 SENSITIVE Sensitive     * ESCHERICHIA COLI  Blood Culture ID Panel (Reflexed)     Status: Abnormal   Collection Time: 09/04/21 10:36  PM  Result Value Ref Range Status   Enterococcus faecalis NOT DETECTED NOT DETECTED Final   Enterococcus Faecium NOT DETECTED NOT DETECTED Final   Listeria monocytogenes NOT DETECTED NOT DETECTED Final   Staphylococcus species NOT DETECTED NOT DETECTED Final   Staphylococcus aureus (BCID) NOT DETECTED NOT DETECTED Final   Staphylococcus epidermidis NOT DETECTED NOT DETECTED Final   Staphylococcus lugdunensis NOT DETECTED NOT DETECTED Final   Streptococcus species NOT DETECTED NOT DETECTED Final   Streptococcus agalactiae NOT DETECTED NOT DETECTED Final   Streptococcus pneumoniae NOT DETECTED NOT DETECTED Final   Streptococcus pyogenes NOT DETECTED NOT DETECTED Final   A.calcoaceticus-baumannii NOT DETECTED NOT DETECTED Final   Bacteroides fragilis NOT DETECTED NOT DETECTED Final   Enterobacterales DETECTED (A) NOT DETECTED Final    Comment: Enterobacterales represent a large order of gram negative bacteria, not a single organism. CRITICAL RESULT CALLED TO, READ BACK BY AND VERIFIED WITH: Winfield Rast PATEL 09/05/2021 @0937     Enterobacter cloacae complex NOT DETECTED NOT DETECTED Final   Escherichia coli DETECTED (A) NOT DETECTED Final    Comment: CRITICAL RESULT CALLED TO, READ BACK BY AND VERIFIED WITH: Winfield Rast PATEL 09/05/2021 @0937  SRR    Klebsiella aerogenes NOT DETECTED NOT DETECTED Final   Klebsiella oxytoca NOT DETECTED NOT DETECTED Final   Klebsiella pneumoniae NOT DETECTED NOT DETECTED Final   Proteus species NOT DETECTED NOT DETECTED Final   Salmonella species NOT DETECTED NOT DETECTED Final   Serratia marcescens NOT DETECTED NOT DETECTED Final   Haemophilus influenzae NOT DETECTED NOT DETECTED Final   Neisseria meningitidis NOT DETECTED NOT DETECTED Final   Pseudomonas aeruginosa NOT DETECTED NOT DETECTED Final   Stenotrophomonas maltophilia NOT DETECTED NOT DETECTED Final   Candida albicans NOT DETECTED NOT DETECTED Final   Candida auris NOT DETECTED NOT DETECTED Final    Candida glabrata NOT DETECTED NOT DETECTED Final   Candida krusei NOT DETECTED NOT DETECTED Final   Candida parapsilosis NOT DETECTED NOT DETECTED Final   Candida tropicalis NOT DETECTED NOT DETECTED Final   Cryptococcus neoformans/gattii NOT DETECTED NOT DETECTED Final   CTX-M ESBL NOT DETECTED NOT DETECTED Final   Carbapenem resistance IMP NOT DETECTED NOT DETECTED Final   Carbapenem resistance KPC NOT DETECTED NOT DETECTED Final   Carbapenem resistance NDM NOT DETECTED NOT DETECTED Final   Carbapenem resist OXA 48 LIKE NOT DETECTED NOT DETECTED Final   Carbapenem resistance VIM NOT DETECTED NOT DETECTED Final    Comment: Performed at [facility], [address], [place], [place] [address]  Urine Culture     Status: Abnormal   Collection Time: 09/04/21 11:12 PM   Specimen: Urine, Random  Result Value Ref Range Status   Specimen Description   Final    URINE, RANDOM Performed at [facility], [address], [place], [place] [address]    Special Requests   Final    NONE Performed at [facility], [address] [place], [place]  [address]    Culture >=100,000 COLONIES/mL ESCHERICHIA COLI (A)  Final   Report Status 09/07/2021 FINAL  Final   Organism ID, Bacteria ESCHERICHIA COLI (A)  Final      Susceptibility   Escherichia coli - MIC*    AMPICILLIN >=32 RESISTANT Resistant     CEFAZOLIN <=4 SENSITIVE Sensitive     CEFEPIME <=0.12 SENSITIVE Sensitive     CEFTRIAXONE <=0.25 SENSITIVE Sensitive     CIPROFLOXACIN <=0.25 SENSITIVE Sensitive     GENTAMICIN <=1 SENSITIVE Sensitive     IMIPENEM <=0.25 SENSITIVE Sensitive     NITROFURANTOIN <=16 SENSITIVE Sensitive     TRIMETH/SULFA <=20 SENSITIVE Sensitive     AMPICILLIN/SULBACTAM 16 INTERMEDIATE Intermediate     PIP/TAZO <=4 SENSITIVE Sensitive     * >=100,000 COLONIES/mL ESCHERICHIA COLI    Labs: CBC: No results for input(s): "WBC", "NEUTROABS", "HGB", "HCT", "MCV", "PLT" in the last 168  hours.  Basic Metabolic Panel: No results for input(s): "NA", "K", "CL", "CO2", "GLUCOSE", "BUN", "CREATININE", "CALCIUM", "MG", "PHOS" in the last 168 hours.  Liver Function Tests: No results for input(s): "AST", "ALT", "ALKPHOS", "BILITOT", "PROT", "ALBUMIN" in the last 168 hours.  CBG: No results for input(s): "GLUCAP" in the last 168 hours.  Discharge time spent: less than 30 minutes.  Signed: Ezekiel Slocumb, DO [facility] 09/17/2021

## 2021-09-09 NOTE — Progress Notes (Signed)
Pt discharged per MD order. IV removed. Discharge instructions reviewed with pt. Pt verbalized understanding.  All questions answered to pt satisfaction.

## 2021-09-10 ENCOUNTER — Telehealth: Payer: Self-pay

## 2021-09-10 LAB — CUP PACEART REMOTE DEVICE CHECK
Battery Remaining Longevity: 144 mo
Battery Remaining Percentage: 100 %
Brady Statistic RA Percent Paced: 1 %
Brady Statistic RV Percent Paced: 100 %
Date Time Interrogation Session: 20230717144900
Implantable Lead Implant Date: 20130830
Implantable Lead Implant Date: 20130830
Implantable Lead Location: 753859
Implantable Lead Location: 753860
Implantable Lead Model: 4456
Implantable Lead Model: 4479
Implantable Lead Serial Number: 473325
Implantable Lead Serial Number: 523784
Implantable Pulse Generator Implant Date: 20211011
Lead Channel Impedance Value: 426 Ohm
Lead Channel Impedance Value: 593 Ohm
Lead Channel Pacing Threshold Amplitude: 0.4 V
Lead Channel Pacing Threshold Pulse Width: 0.4 ms
Lead Channel Setting Pacing Amplitude: 2 V
Lead Channel Setting Pacing Amplitude: 2 V
Lead Channel Setting Pacing Pulse Width: 0.4 ms
Lead Channel Setting Sensing Sensitivity: 2.5 mV
Pulse Gen Serial Number: 949303

## 2021-09-10 NOTE — Telephone Encounter (Signed)
Transition Care Management Follow-up Telephone Call Date of discharge and from where: 7/17, [facility], chest pain, UTI How have you been since you were released from the hospital? Feeling better but not all the way back to normal. Any questions or concerns? No  Items Reviewed: Did the pt receive and understand the discharge instructions provided? No  Medications obtained and verified? Yes , abx Other? No  Any new allergies since your discharge? No  Dietary orders reviewed? No Do you have support at home? Yes   Home Care and Equipment/Supplies: Were home health services ordered? no If so, what is the name of the agency? N/a  Has the agency set up a time to come to the patient's home? not applicable Were any new equipment or medical supplies ordered?  Yes: walker and  bedside commode  What is the name of the medical supply agency? [employer] Were you able to get the supplies/equipment? yes Do you have any questions related to the use of the equipment or supplies? No  Functional Questionnaire: (I = Independent and D = Dependent) ADLs: D  Bathing/Dressing- D  Meal Prep- D  Eating- D  Maintaining continence- D  Transferring/Ambulation- D  Managing Meds- D  Follow up appointments reviewed:  PCP Hospital f/u appt confirmed? Yes  Scheduled to see Dr. Glori Bickers on 09/17/21 @ 2:30. Pt requested afternoon apt so her husband could provide transportation.  [facility] f/u appt confirmed? No  Scheduled to see n/a on n/a @ n/a. Are transportation arrangements needed? No  If their condition worsens, is the pt aware to call PCP or go to the Emergency Dept.? Yes Was the patient provided with contact information for the PCP's office or ED? Yes Was to pt encouraged to call back with questions or concerns? Yes

## 2021-09-17 ENCOUNTER — Encounter: Payer: Self-pay | Admitting: Family Medicine

## 2021-09-17 ENCOUNTER — Ambulatory Visit (INDEPENDENT_AMBULATORY_CARE_PROVIDER_SITE_OTHER): Payer: Medicare Other | Admitting: Family Medicine

## 2021-09-17 VITALS — BP 104/68 | HR 72 | Ht 68.0 in | Wt 190.6 lb

## 2021-09-17 DIAGNOSIS — R197 Diarrhea, unspecified: Secondary | ICD-10-CM | POA: Diagnosis not present

## 2021-09-17 DIAGNOSIS — N3000 Acute cystitis without hematuria: Secondary | ICD-10-CM | POA: Diagnosis not present

## 2021-09-17 DIAGNOSIS — N179 Acute kidney failure, unspecified: Secondary | ICD-10-CM | POA: Diagnosis not present

## 2021-09-17 DIAGNOSIS — E871 Hypo-osmolality and hyponatremia: Secondary | ICD-10-CM | POA: Diagnosis not present

## 2021-09-17 DIAGNOSIS — E041 Nontoxic single thyroid nodule: Secondary | ICD-10-CM

## 2021-09-17 DIAGNOSIS — Z87440 Personal history of urinary (tract) infections: Secondary | ICD-10-CM

## 2021-09-17 DIAGNOSIS — I2510 Atherosclerotic heart disease of native coronary artery without angina pectoris: Secondary | ICD-10-CM

## 2021-09-17 LAB — POCT URINALYSIS DIP (CLINITEK)
Bilirubin, UA: NEGATIVE
Blood, UA: NEGATIVE
Glucose, UA: NEGATIVE mg/dL
Ketones, POC UA: NEGATIVE mg/dL
Leukocytes, UA: NEGATIVE
Nitrite, UA: NEGATIVE
POC PROTEIN,UA: NEGATIVE
Spec Grav, UA: 1.015 (ref 1.010–1.025)
Urobilinogen, UA: 0.2 E.U./dL
pH, UA: 6 (ref 5.0–8.0)

## 2021-09-17 NOTE — Assessment & Plan Note (Signed)
Recent hospitalization for this causing sepsis Reviewed hospital records, lab results and studies in detail  Clinically resolved  ua clear  Lab pending

## 2021-09-17 NOTE — Assessment & Plan Note (Signed)
No longer has diarrhea  Reviewed hospital records, lab results and studies in detail  Labs pending for today

## 2021-09-17 NOTE — Patient Instructions (Addendum)
I will order a thyroid ultrasound to look at the nodule  You will get a call to set that up in [place]   Urine is clear today   Lab today   As you continue to improve- decide when you want to return to pulmonary rehab  Gradually advance your activity as tolerated  Use walker as needed  If you want [place] to order PT for home leet me know

## 2021-09-17 NOTE — Assessment & Plan Note (Addendum)
Pt recently hosp for uti/sepsis and she presented with n/v/d Reviewed hospital records, lab results and studies in detail  CT revealed likely enteritis  Hydrated and symptoms resolved today  Will continue to monitor  Labs pending

## 2021-09-17 NOTE — Assessment & Plan Note (Signed)
Recent uti with sepsis  Reviewed hospital records, lab results and studies in detail  ua clear today  cmet ordered  Good fluid intake

## 2021-09-17 NOTE — Progress Notes (Signed)
Subjective:    Patient ID: Diana Cook, female    DOB: 1950/03/31, 71 y.o.   MRN: [id number]  HPI Pt presents for f/u of hospitalization for sepsis   Wt Readings from Last 3 Encounters:  09/17/21 190 lb 9.6 oz (86.5 kg)  09/06/21 191 lb 12.8 oz (87 kg)  09/03/21 191 lb 3.2 oz (86.7 kg)   28.98 kg/m    She was admitted on 7/12 after presenting with chest pain, urinary symptoms, fever and n/v/d  Found pulse ox 87% in ER on 4L 0s   Admit a and p Assessment and Plan: * Sepsis due to gram-negative UTI [facility]) - The patient will be admitted to a medical telemetry bed. - We will continue antibiotic therapy with IV Rocephin. - We will follow blood and urine cultures.    Urine and blood cultures grew e cp;o   GERD without esophagitis We will continue PPI therapy.   Depression - We will continue Prozac.   Dyslipidemia - We will continue statin.   Paroxysmal atrial fibrillation (HCC) - The patient will be continued on Eliquis.  CTA chest  interp IMPRESSION: 1. Negative for acute aortic dissection.  No aneurysm. 2. Moderate aortic atherosclerosis without high-grade stenosis or occlusive disease within the abdomen or pelvis. Small penetrating ulcer or ulcerated plaque at the distal descending thoracic aorta. 3. Emphysema. Redemonstrated bilateral pulmonary nodules including 9 mm spiculated right upper lobe pulmonary nodule. Consider one of the following in 3 months for both low-risk and high-risk individuals: (a) repeat chest CT, (b) follow-up PET-CT, or (c) tissue sampling. This recommendation follows the consensus statement: Guidelines for Management of Incidental Pulmonary Nodules Detected on CT Images: From the Fleischner Society 2017; Radiology 2017; 284:228-243. 4. Diffuse fluid-filled small and large bowel without obstruction or wall thickening, findings are suggestive of enteritis 5. 1.5 cm nodule in the left lobe of the thyroid. Recommend thyroid US (ref: J Am  Coll Radiol. 2015 Feb;12(2): 143-50). This should be performed non emergently  She can now elevate head of the bed   Thyroid nodule   Labs on discharge Lab Results  Component Value Date   CREATININE 0.95 09/09/2021   BUN 21 09/09/2021   NA 132 (L) 09/09/2021   K 3.9 09/09/2021   CL 100 09/09/2021   CO2 24 09/09/2021  GFR over 60  Lab Results  Component Value Date   ALT 17 09/04/2021   AST 24 09/04/2021   ALKPHOS 78 09/04/2021   BILITOT 1.4 (H) 09/04/2021   Lab Results  Component Value Date   TROPONINI < 0.02 07/21/2013   Lab Results  Component Value Date   INR 1.5 (H) 09/05/2021   INR 1.4 (H) 09/04/2021   INR 1.0 04/11/2021     Indirect bili 1l1 and direct 0.3 Lab Results  Component Value Date   WBC 6.2 09/08/2021   HGB 10.7 (L) 09/08/2021   HCT 32.6 (L) 09/08/2021   MCV 88.8 09/08/2021   PLT 216 09/08/2021    Lab Results  Component Value Date   TSH 1.10 07/23/2021    Her d/c summary is still incomplete  She was d/c with walker   BP Readings from Last 3 Encounters:  09/17/21 104/68  09/09/21 131/64  08/01/21 100/60   Pulse Readings from Last 3 Encounters:  09/17/21 72  09/09/21 65  08/01/21 78   Pulse ox is 92% today   Pt has finished the abx  Thinks urine still looks cloudy  Clear on dip  Results for orders placed or performed in visit on 09/17/21  POCT URINALYSIS DIP (CLINITEK)  Result Value Ref Range   Color, UA yellow yellow   Clarity, UA clear clear   Glucose, UA negative negative mg/dL   Bilirubin, UA negative negative   Ketones, POC UA negative negative mg/dL   Spec Grav, UA 1.015 1.010 - 1.025   Blood, UA negative negative   pH, UA 6.0 5.0 - 8.0   POC PROTEIN,UA negative negative, trace   Urobilinogen, UA 0.2 0.2 or 1.0 E.U./dL   Nitrite, UA Negative Negative   Leukocytes, UA Negative Negative   Making effort to drink enough fluids No blood in urine at all   Finished abx/not sure what it was     Getting strength back  little by little  Still tired  Home for a week now  Ups and downs  Uses a walker if she needs it   Missed 3 pulm rehab classes  Not quite ready to go back     Needs bmp and cbc today    Takes medicine for epilepsy  Makes her tired  No seizure since her daughter died   July 31, 2022 discuss with neurology ? Trial without med   Patient Active Problem List   Diagnosis Date Noted   Thyroid nodule 09/17/2021   Hyponatremia 09/17/2021   Sepsis due to gram-negative UTI ([place]) 09/05/2021   Dyslipidemia 09/05/2021   Depression 09/05/2021   GERD without esophagitis 09/05/2021   Vitamin D deficiency 08/20/2021   Colon cancer screening 07/30/2021   Hypercalcemia 07/29/2021   Diarrhea 07/29/2021   UTI (urinary tract infection) 07/17/2021   AKI (acute kidney injury) ([place]) 07/17/2021   Dysuria 07/08/2021   Hypotension 07/08/2021   Paroxysmal atrial fibrillation (HCC) 04/11/2021   Current use of proton pump inhibitor 10/24/2020   Sleep apnea 10/24/2020   Fatigue 10/24/2020   Orthostatic lightheadedness 05/16/2020   Radicular pain in left arm 03/02/2020   Neck pain on left side 02/15/2020   Paresthesia 02/15/2020   Chronic respiratory failure with hypoxia ([place]) 11/17/2019   Chronic heart failure with preserved ejection fraction (HFpEF) ([place]) 08/11/2019   Coronary artery disease involving native coronary artery of native heart without angina pectoris 06/20/2019   Gastroesophageal reflux disease    Stomach irritation    Abnormal CT scan, esophagus    Polyp of ascending colon    Personal history of tobacco use, presenting hazards to health 01/18/2018   Grade I diastolic dysfunction 24/26/8341   Globus pharyngeus 12/21/2017   Chronic obstructive pulmonary disease ([place]) 12/15/2017   Hyperlipidemia LDL goal <70 12/07/2017   Seizures ([place]) 10/01/2016   Elevated glucose 08/31/2016   Grief reaction 04/11/2016   Malignant neoplasm of upper-outer quadrant of right breast in female, estrogen  receptor positive ([place]) 12/11/2015   Osteoporosis 11/22/2015   Estrogen deficiency 09/07/2015   Bruising 09/07/2015   Wrinkles 09/07/2015   Post herpetic neuralgia 09/01/2014   Encounter for Medicare annual wellness exam 07/11/2014   Routine general medical examination at a health care facility 06/26/2013   H/O small bowel obstruction 04/15/2013   Dyspnea on exertion 02/10/2013   Complete heart block ([place]) 11/03/2011   Pacemaker-[employer] 11/03/2011   Lumbar spinal stenosis 09/02/2011   POSTMENOPAUSAL STATUS 08/06/2009   HIDRADENITIS SUPPURATIVA 06/26/2008   HERNIATED DISC 12/14/2007   BACK, LOWER, PAIN 12/02/2007   Hyperkalemia 08/06/2007   HX, PERSONAL, COLONIC POLYPS 09/14/2006   Former smoker 06/10/2006   ASTHMA 06/10/2006   H/O idiopathic seizure  06/10/2006   Past Medical History:  Diagnosis Date   Allergic rhinitis    Arthritis    Asthma    as a child, mild now   Breast cancer ([place]) 12/2015   right breast cancer, lumpectomy and mammosite    Colon polyps    colonoscopy 7/08, tubular adenoma   Complete heart block ([place]) 09/2011   s/p PPM implanted in [place] [facility]   COPD (chronic obstructive pulmonary disease) ([place])    Myocardial infarction ([place]) 2011   Osteopenia 10/2015   Pacemaker    2011   Paroxysmal atrial fibrillation ([place]) 03/2021   Incidentally detected on pacemaker interrogation   Personal history of radiation therapy 2017   right breast ca, mammosite placed   Seizure disorder ([place])    Seizures ([place])    first one was when she was 71 years old    Small bowel obstruction ([place])    1988 and 2002   Tobacco abuse    Past Surgical History:  Procedure Laterality Date   ABDOMINAL HYSTERECTOMY     BREAST BIOPSY Right 2007   benign inflammatory changes, mass due to underwire bra   BREAST BIOPSY Left 01/02/2016   columnar cell changes without atypical hyperplasia.   BREAST BIOPSY Right 12/06/2015   rt breast mass 10:00, bx done at Dr. Curly Shores  office, invasive ductal carcinoma   BREAST EXCISIONAL BIOPSY Left 01/02/2016   COLUMNAR CELL CHANGE AND HYPERPLASIA ASSOCIATED WITH LUMINAL AND STROMAL CALCIFICATIONS   BREAST LUMPECTOMY Right 01/02/2016   invasive mammary carcinoma, clear margins, negative LN   BREAST LUMPECTOMY WITH SENTINEL LYMPH NODE BIOPSY Right 01/02/2016   pT1c, N0; ER/ PR 100%; Her 2 neu not over expressed: BREAST LUMPECTOMY WITH SENTINEL LYMPH NODE BX;  Surgeon: [name], MD;  Location: [facility] ORS;  Service: General;  Laterality: Right;   CARDIAC CATHETERIZATION     CATARACT EXTRACTION Bilateral    COLONOSCOPY  10/2015   Dr Ardis Hughs   COLONOSCOPY WITH PROPOFOL N/A 08/19/2018   Procedure: COLONOSCOPY WITH PROPOFOL;  Surgeon: Virgel Manifold, MD;  Location: [facility] ENDOSCOPY;  Service: Endoscopy;  Laterality: N/A;   ESOPHAGOGASTRODUODENOSCOPY (EGD) WITH PROPOFOL N/A 08/19/2018   Procedure: ESOPHAGOGASTRODUODENOSCOPY (EGD) WITH PROPOFOL;  Surgeon: Virgel Manifold, MD;  Location: [facility] ENDOSCOPY;  Service: Endoscopy;  Laterality: N/A;   EXPLORATORY LAPAROTOMY  01/25/2001   Exploratory laparotomy, lysis of adhesions, identification of internal hernia secondary to omental adhesion. Prolonged postoperative ileus.   gyn surgery  1993   hysterectomy- form endometriosis   LAPAROSCOPY     PACEMAKER INSERTION  10/24/11   [employer] Advantio dual chamber PPM implanted by Dr Bunnie Philips at [facility] in [place] N/A 12/05/2019   Procedure: [place];  Surgeon: Deboraha Sprang, MD;  Location: [place] [facility];  Service: Cardiovascular;  Laterality: N/A;   RIGHT/LEFT HEART CATH AND CORONARY ANGIOGRAPHY Bilateral 07/19/2019   Procedure: RIGHT/LEFT HEART CATH AND CORONARY ANGIOGRAPHY;  Surgeon: Nelva Bush, MD;  Location: [place] [facility];  Service: Cardiovascular;  Laterality: Bilateral;   TEMPORARY PACEMAKER N/A 12/05/2019   Procedure: TEMPORARY  PACEMAKER;  Surgeon: Deboraha Sprang, MD;  Location: [place] [facility];  Service: Cardiovascular;  Laterality: N/A;   Social History   Tobacco Use   Smoking status: Former    Packs/day: 1.50    Years: 42.00    Total pack years: 63.00    Types: Cigarettes    Quit date: 11/11/2010  Years since quitting: 10.8   Smokeless tobacco: Never  Vaping Use   Vaping Use: Never used  Substance Use Topics   Alcohol use: Yes    Alcohol/week: 2.0 standard drinks of alcohol    Types: 2 Glasses of wine per week    Comment: weekly   Drug use: No   Family History  Problem Relation Age of Onset   Stroke Mother    Heart disease Mother 35       MI and CABG   Dementia Mother    Coronary artery disease Father    Parkinsonism Father    Cancer - Cervical Daughter 22       died 2022-04-06   Heart attack Brother 42   Colon cancer Neg Hx    Breast cancer Neg Hx    Allergies  Allergen Reactions   Codeine Nausea And Vomiting   Morphine And Related Nausea Only   Current Outpatient Medications on File Prior to Visit  Medication Sig Dispense Refill   acetaminophen (TYLENOL) 500 MG tablet Take 500-1,000 mg by mouth every 6 (six) hours as needed for mild pain or headache.      albuterol (VENTOLIN HFA) 108 (90 Base) MCG/ACT inhaler Inhale 2 puffs into the lungs every 4 (four) hours as needed for wheezing or shortness of breath. 18 g 1   apixaban (ELIQUIS) 5 MG TABS tablet Take 1 tablet by mouth twice daily 60 tablet 5   atorvastatin (LIPITOR) 40 MG tablet Take 1 tablet by mouth once daily 90 tablet 0   benzonatate (TESSALON) 100 MG capsule Take by mouth as needed.     feeding supplement (ENSURE ENLIVE / ENSURE PLUS) LIQD Take 237 mLs by mouth 3 (three) times daily between meals. 237 mL 12   FLUoxetine (PROZAC) 20 MG capsule Take 1 capsule by mouth once daily 90 capsule 3   fluticasone (FLONASE) 50 MCG/ACT nasal spray Place 1 spray into both nostrils daily.  2   furosemide (LASIX) 40 MG tablet Take 1 tablet  (40 mg total) by mouth daily as needed (swelling, wt gain or needing more oxygen). 90 tablet 0   levETIRAcetam (KEPPRA) 250 MG tablet Take 250 mg by mouth 2 (two) times daily.      loratadine (CLARITIN) 10 MG tablet Take 1 tablet (10 mg total) by mouth daily.     Multiple Vitamin (MULTIVITAMIN WITH MINERALS) TABS tablet Take 1 tablet by mouth daily.     omeprazole (PRILOSEC) 40 MG capsule Take 40 mg by mouth daily.     OXYGEN Inhale 3 L into the lungs at bedtime.      STIOLTO RESPIMAT 2.5-2.5 MCG/ACT AERS INHALE 2 PUFFS BY MOUTH ONCE DAILY 4 g 2   tretinoin (RETIN-A) 0.1 % cream Apply topically at bedtime. 45 g 1   VITAMIN D PO Take 1 tablet by mouth daily.     zolpidem (AMBIEN) 10 MG tablet TAKE 1/2 TO 1 (ONE-HALF TO ONE) TABLET BY MOUTH AT BEDTIME AS NEEDED FOR SLEEP 30 tablet 3   No current facility-administered medications on file prior to visit.    Review of Systems  Constitutional:  Positive for fatigue. Negative for activity change, appetite change, fever and unexpected weight change.  HENT:  Negative for congestion, ear pain, rhinorrhea, sinus pressure and sore throat.   Eyes:  Negative for pain, redness and visual disturbance.  Respiratory:  Negative for cough, shortness of breath and wheezing.   Cardiovascular:  Negative for chest pain and palpitations.  Gastrointestinal:  Negative for abdominal pain, blood in stool, constipation and diarrhea.  Endocrine: Negative for polydipsia and polyuria.  Genitourinary:  Negative for dysuria, frequency and urgency.  Musculoskeletal:  Negative for arthralgias, back pain and myalgias.  Skin:  Negative for pallor and rash.  Allergic/Immunologic: Negative for environmental allergies.  Neurological:  Positive for weakness. Negative for dizziness, syncope, speech difficulty and headaches.  Hematological:  Negative for adenopathy. Does not bruise/bleed easily.  Psychiatric/Behavioral:  Negative for decreased concentration and dysphoric mood. The  patient is not nervous/anxious.        Objective:   Physical Exam Constitutional:      General: She is not in acute distress.    Appearance: Normal appearance. She is well-developed and normal weight. She is not ill-appearing or diaphoretic.     Comments: Appears well but slt fatigued   HENT:     Head: Normocephalic and atraumatic.     Mouth/Throat:     Mouth: Mucous membranes are moist.     Pharynx: No oropharyngeal exudate or posterior oropharyngeal erythema.  Eyes:     General: No scleral icterus.       Right eye: No discharge.        Left eye: No discharge.     Conjunctiva/sclera: Conjunctivae normal.     Pupils: Pupils are equal, round, and reactive to light.  Neck:     Thyroid: No thyromegaly.     Vascular: No carotid bruit or JVD.  Cardiovascular:     Rate and Rhythm: Normal rate.     Heart sounds: Normal heart sounds.     No gallop.  Pulmonary:     Effort: Pulmonary effort is normal. No respiratory distress.     Breath sounds: Normal breath sounds. No stridor. No wheezing, rhonchi or rales.     Comments: Diffusely distant bs No rales No crackles  Abdominal:     General: There is no distension or abdominal bruit.     Palpations: Abdomen is soft.     Tenderness: There is no abdominal tenderness. There is no right CVA tenderness, left CVA tenderness, guarding or rebound.  Musculoskeletal:     Cervical back: Normal range of motion and neck supple.     Right lower leg: No edema.     Left lower leg: No edema.  Lymphadenopathy:     Cervical: No cervical adenopathy.  Skin:    General: Skin is warm and dry.     Coloration: Skin is not jaundiced or pale.     Findings: No bruising or rash.  Neurological:     Mental Status: She is alert.     Cranial Nerves: No cranial nerve deficit.     Coordination: Coordination normal.     Deep Tendon Reflexes: Reflexes are normal and symmetric. Reflexes normal.  Psychiatric:        Mood and Affect: Mood normal.            Assessment & Plan:   Problem List Items Addressed This Visit       Endocrine   Thyroid nodule    Incidental finding on CTA in hospital 1.5 cm on L No symptoms  Referral done for [place] of thyroid  Lab Results  Component Value Date   TSH 1.10 07/23/2021         Relevant Orders   US THYROID     Genitourinary   AKI (acute kidney injury) ([place])    Recent uti with sepsis  Reviewed hospital records, lab results and studies  in detail  ua clear today  cmet ordered  Good fluid intake      UTI (urinary tract infection)    Recent hospitalization for this causing sepsis Reviewed hospital records, lab results and studies in detail  Clinically resolved  ua clear  Lab pending         Other   Diarrhea    Pt recently hosp for uti/sepsis and she presented with n/v/d Reviewed hospital records, lab results and studies in detail  CT revealed likely enteritis  Hydrated and symptoms resolved today  Will continue to monitor  Labs pending       Relevant Orders   CBC with Differential/Platelet   Basic metabolic panel   Hyponatremia    No longer has diarrhea  Reviewed hospital records, lab results and studies in detail  Labs pending for today      Relevant Orders   Basic metabolic panel   Other Visit Diagnoses     History of UTI    -  Primary   Relevant Orders   POCT URINALYSIS DIP (CLINITEK) (Completed)   CBC with Differential/Platelet

## 2021-09-17 NOTE — Assessment & Plan Note (Signed)
Incidental finding on CTA in hospital 1.5 cm on L No symptoms  Referral done for [place] of thyroid  Lab Results  Component Value Date   TSH 1.10 07/23/2021

## 2021-09-18 LAB — CBC WITH DIFFERENTIAL/PLATELET
Basophils Absolute: 0.1 10*3/uL (ref 0.0–0.1)
Basophils Relative: 1 % (ref 0.0–3.0)
Eosinophils Absolute: 0.2 10*3/uL (ref 0.0–0.7)
Eosinophils Relative: 3.5 % (ref 0.0–5.0)
HCT: 35.1 % — ABNORMAL LOW (ref 36.0–46.0)
Hemoglobin: 11.6 g/dL — ABNORMAL LOW (ref 12.0–15.0)
Lymphocytes Relative: 18.1 % (ref 12.0–46.0)
Lymphs Abs: 1.2 10*3/uL (ref 0.7–4.0)
MCHC: 33.1 g/dL (ref 30.0–36.0)
MCV: 89.7 fl (ref 78.0–100.0)
Monocytes Absolute: 0.6 10*3/uL (ref 0.1–1.0)
Monocytes Relative: 9.9 % (ref 3.0–12.0)
Neutro Abs: 4.4 10*3/uL (ref 1.4–7.7)
Neutrophils Relative %: 67.5 % (ref 43.0–77.0)
Platelets: 502 10*3/uL — ABNORMAL HIGH (ref 150.0–400.0)
RBC: 3.91 Mil/uL (ref 3.87–5.11)
RDW: 13.6 % (ref 11.5–15.5)
WBC: 6.5 10*3/uL (ref 4.0–10.5)

## 2021-09-18 LAB — BASIC METABOLIC PANEL
BUN: 21 mg/dL (ref 6–23)
CO2: 22 mEq/L (ref 19–32)
Calcium: 9.5 mg/dL (ref 8.4–10.5)
Chloride: 103 mEq/L (ref 96–112)
Creatinine, Ser: 1.02 mg/dL (ref 0.40–1.20)
GFR: 54.72 mL/min — ABNORMAL LOW (ref >60.00)
Glucose, Bld: 90 mg/dL (ref 70–99)
Potassium: 4 mEq/L (ref 3.5–5.1)
Sodium: 133 mEq/L — ABNORMAL LOW (ref 135–145)

## 2021-09-20 ENCOUNTER — Other Ambulatory Visit: Payer: Self-pay | Admitting: Family Medicine

## 2021-09-20 NOTE — Telephone Encounter (Signed)
Last filled 07/12/21 Last OV 09/17/21

## 2021-09-20 NOTE — Progress Notes (Signed)
Remote pacemaker transmission.   

## 2021-09-23 ENCOUNTER — Other Ambulatory Visit: Payer: Self-pay

## 2021-09-23 ENCOUNTER — Ambulatory Visit (INDEPENDENT_AMBULATORY_CARE_PROVIDER_SITE_OTHER): Payer: Medicare Other | Admitting: Nurse Practitioner

## 2021-09-23 VITALS — BP 90/52 | HR 67 | Temp 96.4°F | Resp 12 | Wt 185.5 lb

## 2021-09-23 DIAGNOSIS — W19XXXA Unspecified fall, initial encounter: Secondary | ICD-10-CM | POA: Diagnosis not present

## 2021-09-23 DIAGNOSIS — I9589 Other hypotension: Secondary | ICD-10-CM | POA: Diagnosis not present

## 2021-09-23 DIAGNOSIS — R42 Dizziness and giddiness: Secondary | ICD-10-CM | POA: Diagnosis not present

## 2021-09-23 LAB — POCT URINALYSIS DIPSTICK
Bilirubin, UA: NEGATIVE
Blood, UA: NEGATIVE
Glucose, UA: NEGATIVE
Ketones, UA: NEGATIVE
Leukocytes, UA: NEGATIVE
Nitrite, UA: NEGATIVE
Protein, UA: NEGATIVE
Spec Grav, UA: 1.01 (ref 1.010–1.025)
Urobilinogen, UA: 0.2 E.U./dL
pH, UA: 6 (ref 5.0–8.0)

## 2021-09-23 NOTE — Telephone Encounter (Signed)
Patient asked for refill on Zolpidem while here for OV with [facility] today. I called [facility] and found out that patient had a refill left on file and it is ready for pick up. Patient advised.

## 2021-09-23 NOTE — Patient Instructions (Signed)
Nice to see you today I want you to start drinking at least one electrolyte drink to help with the lightheadedness. This can be Gatorade, IV hydration, Gatorlyte, Pedialyte or a store brand.  Follow up with Dr. Glori Bickers if you are not improving   If you fall more or the lightheadedness continues go to the Emergency department for fluids

## 2021-09-23 NOTE — Progress Notes (Signed)
Acute Office Visit  Subjective:     Patient ID: Diana Cook, female    DOB: 05/22/1950, 71 y.o.   MRN: [id number]  Chief Complaint  Patient presents with   Dizziness    Has had more dizziness and really weak. Patient saw Dr Glori Bickers on 09/17/21 for hospital follow up and f/u UTI> patient was told if she was not better to get re checked.      Patient is in today for dizziness  Patient was in the hospital and was discharged on 09/04/2021 and was seen by Dr. Glori Bickers 09/17/2021. States that she has not improved. States 2 days ago it got worse. States she feels lightheaded that caused a fall  States that she fell at home unsure if she got lightheaded or off balance. Hit the trash can and rug.  Denies loc.   States that she is eating and drinking. States she has been drinking tea and water. States that she has not regained her appetite since.  Has had a bagel and grapes at home.  States that today she has been to the bathroom three times. She did not have diarrhea since being in the hospital. No blood in urine or stool    Review of Systems  Constitutional:  Positive for chills. Negative for fever.  Eyes:  Positive for blurred vision (For greater than a month). Negative for double vision.  Respiratory:  Negative for shortness of breath.   Cardiovascular:  Negative for chest pain.  Neurological:  Positive for dizziness. Negative for headaches.        Objective:    BP (!) 90/52   Pulse 67   Temp (!) 96.4 F (35.8 C)   Resp 12   Wt 185 lb 8 oz (84.1 kg)   SpO2 93% Comment: on 4 liters of O2  BMI 28.21 kg/m  BP Readings from Last 3 Encounters:  09/23/21 (!) 90/52  09/17/21 104/68  09/09/21 131/64   Wt Readings from Last 3 Encounters:  09/23/21 185 lb 8 oz (84.1 kg)  09/17/21 190 lb 9.6 oz (86.5 kg)  09/06/21 191 lb 12.8 oz (87 kg)      Physical Exam Vitals and nursing note reviewed.  Constitutional:      Appearance: Normal appearance.  HENT:     Right Ear: Tympanic  membrane, ear canal and external ear normal.     Left Ear: Tympanic membrane, ear canal and external ear normal.     Mouth/Throat:     Mouth: Mucous membranes are moist.     Pharynx: Oropharynx is clear.  Eyes:     Extraocular Movements: Extraocular movements intact.     Pupils: Pupils are equal, round, and reactive to light.  Cardiovascular:     Rate and Rhythm: Normal rate and regular rhythm.  Pulmonary:     Effort: Pulmonary effort is normal.     Breath sounds: Normal breath sounds.     Comments: Patient on 4 L of nasal cannula Baseline Per patient Abdominal:     General: Bowel sounds are normal. There is no distension.     Palpations: There is no mass.     Tenderness: There is abdominal tenderness.     Hernia: No hernia is present.  Musculoskeletal:     Right lower leg: No edema.     Left lower leg: No edema.  Skin:    General: Skin is warm.  Neurological:     General: No focal deficit present.  Mental Status: She is alert.     Cranial Nerves: Cranial nerves 2-12 are intact. No facial asymmetry.     Sensory: Sensation is intact.     Motor: Motor function is intact. No weakness.     Coordination: Finger-Nose-Finger Test normal.     Gait: Gait is intact.     Deep Tendon Reflexes:     Reflex Scores:      Bicep reflexes are 2+ on the right side and 2+ on the left side.      Patellar reflexes are 2+ on the right side and 2+ on the left side.    Comments: Bilateral upper and lower extremity strength 5/5     Results for orders placed or performed in visit on 09/23/21  POCT urinalysis dipstick  Result Value Ref Range   Color, UA yellow    Clarity, UA clear    Glucose, UA Negative Negative   Bilirubin, UA negative    Ketones, UA negative    Spec Grav, UA 1.010 1.010 - 1.025   Blood, UA negative    pH, UA 6.0 5.0 - 8.0   Protein, UA Negative Negative   Urobilinogen, UA 0.2 0.2 or 1.0 E.U./dL   Nitrite, UA negative    Leukocytes, UA Negative Negative   Appearance      Odor          Assessment & Plan:   Problem List Items Addressed This Visit       Cardiovascular and Mediastinum   Arterial hypotension    Patient hypotensive in office.  Did encourage her to drink electrolyte rich drinks and to be extremely careful and take her time during positional changes.  Patient knowledge understood told her if she does not improve she will need IV fluids in place to that been there is emergency department.  Patient and family were acknowledged they like to try outpatient management to begin with        Other   Lightheadedness - Primary    This has been since her discharge from the hospital.  But has not improved.  He did see primary care provider and was told to follow-up if no improvement.  Patient hypotensive in office but not on any antihypertensive agents.  Patient is drinking fluid but some with caffeine.  UA was negative in office did not show a decreased specific gravity.  Did encourage patient to drink some electrolyte-based drinks to see if this will help hold onto the vascular volume.  Did discuss if she does not improve she will need to go to the hospital for fluid replacements.  Patient and spouse at bedside agree and understood      Relevant Orders   POCT urinalysis dipstick (Completed)   Fall    Patient did have a fall at home due to the lightheadedness.  Denies LOC states she did strike her head on the trash can.  Patient is anticoagulated.  Neurological exam completely benign in office.  Did have a conversation with patient about getting a CT scan of the head.  They would like to defer for now if symptoms increase or change consider CT scan of the head or go to the nearest emergency department.       No orders of the defined types were placed in this encounter.   Return if symptoms worsen or fail to improve.  Romilda Garret, NP

## 2021-09-23 NOTE — Assessment & Plan Note (Signed)
Patient hypotensive in office.  Did encourage her to drink electrolyte rich drinks and to be extremely careful and take her time during positional changes.  Patient knowledge understood told her if she does not improve she will need IV fluids in place to that been there is emergency department.  Patient and family were acknowledged they like to try outpatient management to begin with

## 2021-09-23 NOTE — Assessment & Plan Note (Signed)
Patient did have a fall at home due to the lightheadedness.  Denies LOC states she did strike her head on the trash can.  Patient is anticoagulated.  Neurological exam completely benign in office.  Did have a conversation with patient about getting a CT scan of the head.  They would like to defer for now if symptoms increase or change consider CT scan of the head or go to the nearest emergency department.

## 2021-09-23 NOTE — Assessment & Plan Note (Signed)
This has been since her discharge from the hospital.  But has not improved.  He did see primary care provider and was told to follow-up if no improvement.  Patient hypotensive in office but not on any antihypertensive agents.  Patient is drinking fluid but some with caffeine.  UA was negative in office did not show a decreased specific gravity.  Did encourage patient to drink some electrolyte-based drinks to see if this will help hold onto the vascular volume.  Did discuss if she does not improve she will need to go to the hospital for fluid replacements.  Patient and spouse at bedside agree and understood

## 2021-09-26 ENCOUNTER — Inpatient Hospital Stay: Admission: RE | Admit: 2021-09-26 | Payer: Medicare Other | Source: Ambulatory Visit

## 2021-09-26 ENCOUNTER — Encounter: Payer: Self-pay | Admitting: *Deleted

## 2021-09-26 DIAGNOSIS — R0602 Shortness of breath: Secondary | ICD-10-CM

## 2021-09-26 NOTE — Progress Notes (Signed)
Discharge Summary: Diana Cook 1951/02/02  Patient has been in the hospital and recovering. She has been unable to complete her last few sessions. Instructions given on when she feels ready, to contact her doctor's office for new referral.    [place] Name 05/16/21 1638 09/03/21 0940       6 Minute Walk   Phase Initial Discharge    Distance 1050 feet 1130 feet    Distance % Change -- 7.62 %    Distance Feet Change -- 80 ft    Walk Time 6 minutes 6 minutes    # of Rest Breaks 0 0    MPH 1.99 2.14    METS 2 2.28    RPE 11 13    Perceived Dyspnea  3 2    VO2 Peak 7.2 7.96    Symptoms Yes (comment) Yes (comment)    Comments SOB SOB    Resting HR 85 bpm 74 bpm    Resting BP 110/58 112/60    Resting Oxygen Saturation  93 % 91 %    Exercise Oxygen Saturation  during 6 min walk 87 % 82 %    Max Ex. HR 92 bpm 83 bpm    Max Ex. BP 114/58 136/74    2 Minute Post BP 114/64 132/70      Interval HR   1 Minute HR 89 --    2 Minute HR 72 --    3 Minute HR 90 --    4 Minute HR 92 --    5 Minute HR 92 --    6 Minute HR 84 --    2 Minute Post HR 90 --    Interval Heart Rate? Yes --      Interval Oxygen   Interval Oxygen? Yes --    Baseline Oxygen Saturation % 93 % --    1 Minute Oxygen Saturation % 90 % --    1 Minute Liters of Oxygen 2.5 L --    2 Minute Oxygen Saturation % 87 % --    2 Minute Liters of Oxygen 2.5 L --    3 Minute Oxygen Saturation % 87 % --    3 Minute Liters of Oxygen 2.5 L --    4 Minute Oxygen Saturation % 90 % --    4 Minute Liters of Oxygen 2.5 L --    5 Minute Oxygen Saturation % 89 % --    5 Minute Liters of Oxygen 2.5 L --    6 Minute Oxygen Saturation % 89 % --    6 Minute Liters of Oxygen 2.5 L --    2 Minute Post Oxygen Saturation % 95 % --    2 Minute Post Liters of Oxygen 2.5 L --

## 2021-09-26 NOTE — Progress Notes (Signed)
Pulmonary Individual Treatment Plan  Patient Details  Name: Diana Cook MRN: [id number] Date of Birth: 07-28-1950 Referring Provider:   Flowsheet Row Pulmonary Rehab from 05/16/2021 in [facility] Cardiac and Pulmonary Rehab  Referring Provider Patsey Berthold       Initial Encounter Date:  Flowsheet Row Pulmonary Rehab from 05/16/2021 in [facility] Cardiac and Pulmonary Rehab  Date 05/16/21       Visit Diagnosis: Shortness of breath  Patient's Home Medications on Admission:  Current Outpatient Medications:    acetaminophen (TYLENOL) 500 MG tablet, Take 500-1,000 mg by mouth every 6 (six) hours as needed for mild pain or headache. , Disp: , Rfl:    albuterol (VENTOLIN HFA) 108 (90 Base) MCG/ACT inhaler, Inhale 2 puffs into the lungs every 4 (four) hours as needed for wheezing or shortness of breath., Disp: 18 g, Rfl: 1   apixaban (ELIQUIS) 5 MG TABS tablet, Take 1 tablet by mouth twice daily, Disp: 60 tablet, Rfl: 5   atorvastatin (LIPITOR) 40 MG tablet, Take 1 tablet by mouth once daily, Disp: 90 tablet, Rfl: 0   benzonatate (TESSALON) 100 MG capsule, Take by mouth as needed., Disp: , Rfl:    feeding supplement (ENSURE ENLIVE / ENSURE PLUS) LIQD, Take 237 mLs by mouth 3 (three) times daily between meals., Disp: 237 mL, Rfl: 12   FLUoxetine (PROZAC) 20 MG capsule, Take 1 capsule by mouth once daily, Disp: 90 capsule, Rfl: 3   fluticasone (FLONASE) 50 MCG/ACT nasal spray, Place 1 spray into both nostrils daily., Disp: , Rfl: 2   furosemide (LASIX) 40 MG tablet, Take 1 tablet (40 mg total) by mouth daily as needed (swelling, wt gain or needing more oxygen)., Disp: 90 tablet, Rfl: 0   levETIRAcetam (KEPPRA) 250 MG tablet, Take 250 mg by mouth 2 (two) times daily. , Disp: , Rfl:    loratadine (CLARITIN) 10 MG tablet, Take 1 tablet (10 mg total) by mouth daily., Disp: , Rfl:    Multiple Vitamin (MULTIVITAMIN WITH MINERALS) TABS tablet, Take 1 tablet by mouth daily., Disp: , Rfl:    omeprazole (PRILOSEC) 40  MG capsule, Take 40 mg by mouth daily., Disp: , Rfl:    OXYGEN, Inhale 3 L into the lungs at bedtime. , Disp: , Rfl:    STIOLTO RESPIMAT 2.5-2.5 MCG/ACT AERS, INHALE 2 PUFFS BY MOUTH ONCE DAILY, Disp: 4 g, Rfl: 2   tretinoin (RETIN-A) 0.1 % cream, Apply topically at bedtime., Disp: 45 g, Rfl: 1   VITAMIN D PO, Take 1 tablet by mouth daily., Disp: , Rfl:    zolpidem (AMBIEN) 10 MG tablet, TAKE 1/2 TO 1 (ONE-HALF TO ONE) TABLET BY MOUTH AT BEDTIME AS NEEDED FOR SLEEP, Disp: 30 tablet, Rfl: 3  Past Medical History: Past Medical History:  Diagnosis Date   Allergic rhinitis    Arthritis    Asthma    as a child, mild now   Breast cancer ([place]) 12/2015   right breast cancer, lumpectomy and mammosite    Colon polyps    colonoscopy 7/08, tubular adenoma   Complete heart block ([place]) 09/2011   s/p PPM implanted in [place] [facility]   COPD (chronic obstructive pulmonary disease) ([place])    Myocardial infarction ([place]) 2011   Osteopenia 10/2015   Pacemaker    2011   Paroxysmal atrial fibrillation ([place]) 03/2021   Incidentally detected on pacemaker interrogation   Personal history of radiation therapy 2017   right breast ca, mammosite placed   Seizure disorder ([place])  Seizures ([place])    first one was when she was 71 years old    Small bowel obstruction ([place])    1988 and 2002   Tobacco abuse     Tobacco Use: Social History   Tobacco Use  Smoking Status Former   Packs/day: 1.50   Years: 42.00   Total pack years: 63.00   Types: Cigarettes   Quit date: 11/11/2010   Years since quitting: 10.8  Smokeless Tobacco Never    Labs: Review Flowsheet  More data exists      Latest Ref Rng & Units 11/16/2019 03/09/2020 10/24/2020 07/23/2021 09/05/2021  Labs for ITP Cardiac and Pulmonary Rehab  Cholestrol 0 - 200 mg/dL 136  161  - 123  -  LDL (calc) 0 - 99 mg/dL 59  72  - 50  -  HDL-C >39.00 mg/dL 57  65.90  - 49.40  -  Trlycerides 0.0 - 149.0 mg/dL 109  120.0  - 116.0  -  Hemoglobin A1c 4.6 - 6.5 %  - 5.8  6.0  5.6  -  PH, Arterial 7.35 - 7.45 - - - - 7.43   PCO2 arterial 32 - 48 mmHg - - - - 26   Bicarbonate 20.0 - 28.0 mmol/L - - - - 17.3   Acid-base deficit 0.0 - 2.0 mmol/L - - - - 5.7   O2 Saturation % - - - - 96.8      Pulmonary Assessment Scores:  Pulmonary Assessment Scores     Row Name 05/16/21 1646         ADL UCSD   SOB Score total 68     Rest 2     Walk 2     Stairs 4     Bath 2     Dress 1     Shop 4       CAT Score   CAT Score 26       mMRC Score   mMRC Score 1              UCSD: Self-administered rating of dyspnea associated with activities of daily living (ADLs) 6-point scale (0 = "not at all" to 5 = "maximal or unable to do because of breathlessness")  Scoring Scores range from 0 to 120.  Minimally important difference is 5 units  CAT: CAT can identify the health impairment of COPD patients and is better correlated with disease progression.  CAT has a scoring range of zero to 40. The CAT score is classified into four groups of low (less than 10), medium (10 - 20), high (21-30) and very high (31-40) based on the impact level of disease on health status. A CAT score over 10 suggests significant symptoms.  A worsening CAT score could be explained by an exacerbation, poor medication adherence, poor inhaler technique, or progression of COPD or comorbid conditions.  CAT MCID is 2 points  mMRC: mMRC (Modified Medical Research Council) Dyspnea Scale is used to assess the degree of baseline functional disability in patients of respiratory disease due to dyspnea. No minimal important difference is established. A decrease in score of 1 point or greater is considered a positive change.   Pulmonary Function Assessment:   Exercise Target Goals: Exercise Program Goal: Individual exercise prescription set using results from initial 6 min walk test and THRR while considering  patient's activity barriers and safety.   Exercise Prescription Goal: Initial  exercise prescription builds to 30-45 minutes a day of aerobic activity, 2-3 days  per week.  Home exercise guidelines will be given to patient during program as part of exercise prescription that the participant will acknowledge.  Education: Aerobic Exercise: - Group verbal and visual presentation on the components of exercise prescription. Introduces F.I.T.T principle from ACSM for exercise prescriptions.  Reviews F.I.T.T. principles of aerobic exercise including progression. Written material given at graduation. Flowsheet Row Pulmonary Rehab from 08/01/2021 in [facility] Cardiac and Pulmonary Rehab  Date 06/27/21  Educator [facility]  Instruction Review Code 1- Verbalizes Understanding       Education: Resistance Exercise: - Group verbal and visual presentation on the components of exercise prescription. Introduces F.I.T.T principle from ACSM for exercise prescriptions  Reviews F.I.T.T. principles of resistance exercise including progression. Written material given at graduation. Flowsheet Row Pulmonary Rehab from 08/01/2021 in [facility] Cardiac and Pulmonary Rehab  Date 07/04/21  Educator [facility]  Instruction Review Code 1- Verbalizes Understanding        Education: Exercise & Equipment Safety: - Individual verbal instruction and demonstration of equipment use and safety with use of the equipment. Flowsheet Row Pulmonary Rehab from 08/01/2021 in [facility] Cardiac and Pulmonary Rehab  Date 05/16/21  Educator [facility]  Instruction Review Code 1- Verbalizes Understanding       Education: Exercise Physiology & General Exercise Guidelines: - Group verbal and written instruction with models to review the exercise physiology of the cardiovascular system and associated critical values. Provides general exercise guidelines with specific guidelines to those with heart or lung disease.  Flowsheet Row Pulmonary Rehab from 08/01/2021 in  [facility] Cardiac and Pulmonary Rehab  Date 06/20/21  Educator [facility]  Instruction Review Code 1-  [employer] Understanding       Education: Flexibility, Balance, Mind/Body Relaxation: - Group verbal and visual presentation with interactive activity on the components of exercise prescription. Introduces F.I.T.T principle from ACSM for exercise prescriptions. Reviews F.I.T.T. principles of flexibility and balance exercise training including progression. Also discusses the mind body connection.  Reviews various relaxation techniques to help reduce and manage stress (i.e. Deep breathing, progressive muscle relaxation, and visualization). Balance handout provided to take home. Written material given at graduation.   Activity Barriers & Risk Stratification:  Activity Barriers & Cardiac Risk Stratification - 05/08/21 1409       Activity Barriers & Cardiac Risk Stratification   Activity Barriers Joint Problems;Shortness of Breath;Deconditioning;Muscular Weakness;Back Problems             6 Minute Walk:  6 Minute Walk     Row Name 05/16/21 1638 09/03/21 0940       6 Minute Walk   Phase Initial Discharge    Distance 1050 feet 1130 feet    Distance % Change -- 7.62 %    Distance Feet Change -- 80 ft    Walk Time 6 minutes 6 minutes    # of Rest Breaks 0 0    MPH 1.99 2.14    METS 2 2.28    RPE 11 13    Perceived Dyspnea  3 2    VO2 Peak 7.2 7.96    Symptoms Yes (comment) Yes (comment)    Comments SOB SOB    Resting HR 85 bpm 74 bpm    Resting BP 110/58 112/60    Resting Oxygen Saturation  93 % 91 %    Exercise Oxygen Saturation  during 6 min walk 87 % 82 %    Max Ex. HR 92 bpm 83 bpm    Max Ex. BP 114/58 136/74    2  Minute Post BP 114/64 132/70      Interval HR   1 Minute HR 89 --    2 Minute HR 72 --    3 Minute HR 90 --    4 Minute HR 92 --    5 Minute HR 92 --    6 Minute HR 84 --    2 Minute Post HR 90 --    Interval Heart Rate? Yes --      Interval Oxygen   Interval Oxygen? Yes --    Baseline Oxygen Saturation % 93 % --    1 Minute Oxygen Saturation % 90 %  --    1 Minute Liters of Oxygen 2.5 L --    2 Minute Oxygen Saturation % 87 % --    2 Minute Liters of Oxygen 2.5 L --    3 Minute Oxygen Saturation % 87 % --    3 Minute Liters of Oxygen 2.5 L --    4 Minute Oxygen Saturation % 90 % --    4 Minute Liters of Oxygen 2.5 L --    5 Minute Oxygen Saturation % 89 % --    5 Minute Liters of Oxygen 2.5 L --    6 Minute Oxygen Saturation % 89 % --    6 Minute Liters of Oxygen 2.5 L --    2 Minute Post Oxygen Saturation % 95 % --    2 Minute Post Liters of Oxygen 2.5 L --            Oxygen Initial Assessment:  Oxygen Initial Assessment - 07/25/21 0942       Home Oxygen   Home Oxygen Device Home Concentrator;E-Tanks    Sleep Oxygen Prescription Continuous    Liters per minute 3    Home Exercise Oxygen Prescription Continuous    Liters per minute 3    Home Resting Oxygen Prescription Continuous    Liters per minute 3    Compliance with Home Oxygen Use Yes      Program Oxygen Prescription   Program Oxygen Prescription Continuous    Liters per minute 3      Intervention   Short Term Goals To learn and exhibit compliance with exercise, home and travel O2 prescription;To learn and understand importance of monitoring SPO2 with pulse oximeter and demonstrate accurate use of the pulse oximeter.;To learn and demonstrate proper pursed lip breathing techniques or other breathing techniques. ;To learn and understand importance of maintaining oxygen saturations>88%    Long  Term Goals Verbalizes importance of monitoring SPO2 with pulse oximeter and return demonstration;Exhibits compliance with exercise, home  and travel O2 prescription;Maintenance of O2 saturations>88%;Exhibits proper breathing techniques, such as pursed lip breathing or other method taught during program session             Oxygen Re-Evaluation:  Oxygen Re-Evaluation     Row Name 05/21/21 0924 06/18/21 1350 07/25/21 0942 08/22/21 0933       Program Oxygen Prescription    Program Oxygen Prescription Continuous Continuous -- Continuous    Liters per minute -- 3 -- 3      Home Oxygen   Home Oxygen Device Home Concentrator;E-Tanks Home Concentrator;E-Tanks -- Home Concentrator;E-Tanks    Sleep Oxygen Prescription Continuous Continuous -- Continuous    Liters per minute 2.5 2.5 -- 3    Home Exercise Oxygen Prescription Continuous Continuous -- Continuous    Liters per minute 2.5 2.5 -- 3    Home Resting Oxygen Prescription  Continuous Continuous -- Continuous    Liters per minute -- -- -- 3    Compliance with Home Oxygen Use Yes Yes -- Yes      Goals/Expected Outcomes   Short Term Goals To learn and exhibit compliance with exercise, home and travel O2 prescription;To learn and understand importance of monitoring SPO2 with pulse oximeter and demonstrate accurate use of the pulse oximeter.;To learn and demonstrate proper pursed lip breathing techniques or other breathing techniques. ;To learn and understand importance of maintaining oxygen saturations>88% To learn and exhibit compliance with exercise, home and travel O2 prescription;To learn and understand importance of monitoring SPO2 with pulse oximeter and demonstrate accurate use of the pulse oximeter.;To learn and demonstrate proper pursed lip breathing techniques or other breathing techniques. ;To learn and understand importance of maintaining oxygen saturations>88% -- To learn and exhibit compliance with exercise, home and travel O2 prescription;To learn and understand importance of monitoring SPO2 with pulse oximeter and demonstrate accurate use of the pulse oximeter.;To learn and demonstrate proper pursed lip breathing techniques or other breathing techniques. ;To learn and understand importance of maintaining oxygen saturations>88%    Long  Term Goals Verbalizes importance of monitoring SPO2 with pulse oximeter and return demonstration;Exhibits compliance with exercise, home  and travel O2  prescription;Maintenance of O2 saturations>88%;Exhibits proper breathing techniques, such as pursed lip breathing or other method taught during program session Verbalizes importance of monitoring SPO2 with pulse oximeter and return demonstration;Exhibits compliance with exercise, home  and travel O2 prescription;Maintenance of O2 saturations>88%;Exhibits proper breathing techniques, such as pursed lip breathing or other method taught during program session -- Verbalizes importance of monitoring SPO2 with pulse oximeter and return demonstration;Exhibits compliance with exercise, home  and travel O2 prescription;Maintenance of O2 saturations>88%;Exhibits proper breathing techniques, such as pursed lip breathing or other method taught during program session    Comments Reviewed PLB technique with pt.  Talked about how it works and it's importance in maintaining their exercise saturations. Annastasia is staying compliant with her oxygen and still using PLB when needed. She hasn't seen too much of a difference with her SOB but feels like she can do more at home. She is compliant with checking her O2 saturations at home which are staying above 88%. Rifka is doing well. She continues to watch her O2 and HR at home and reports normal values. She feels her breathing has improved, even though she was recently discharged from the hospital. She is excited to get back into the routine of acitivty at home. Jaklyn contnues doing well. She continues to watch her O2 and HR at home and reports normal values. She feels her breathing has improved, but will still have good days and bad days. She is practicing PLB and doesn't feel a difference, but notices her O2 goes up.    Goals/Expected Outcomes Short: Become more profiecient at using PLB.   Long: Become independent at using PLB. Short: Continue checking O2 levels at home Long: Maintain use of PLB and maintain oxygen greater than 88%. Short: Continue PLB when needed Long: Routine  maintenance staying above 88% oxygen. Short: Continue PLB when needed Long: Routine maintenance staying above 88% oxygen.             Oxygen Discharge (Final Oxygen Re-Evaluation):  Oxygen Re-Evaluation - 08/22/21 0933       Program Oxygen Prescription   Program Oxygen Prescription Continuous    Liters per minute 3      Home Oxygen   Home Oxygen Device  Home Concentrator;E-Tanks    Sleep Oxygen Prescription Continuous    Liters per minute 3    Home Exercise Oxygen Prescription Continuous    Liters per minute 3    Home Resting Oxygen Prescription Continuous    Liters per minute 3    Compliance with Home Oxygen Use Yes      Goals/Expected Outcomes   Short Term Goals To learn and exhibit compliance with exercise, home and travel O2 prescription;To learn and understand importance of monitoring SPO2 with pulse oximeter and demonstrate accurate use of the pulse oximeter.;To learn and demonstrate proper pursed lip breathing techniques or other breathing techniques. ;To learn and understand importance of maintaining oxygen saturations>88%    Long  Term Goals Verbalizes importance of monitoring SPO2 with pulse oximeter and return demonstration;Exhibits compliance with exercise, home  and travel O2 prescription;Maintenance of O2 saturations>88%;Exhibits proper breathing techniques, such as pursed lip breathing or other method taught during program session    Comments Jonda contnues doing well. She continues to watch her O2 and HR at home and reports normal values. She feels her breathing has improved, but will still have good days and bad days. She is practicing PLB and doesn't feel a difference, but notices her O2 goes up.    Goals/Expected Outcomes Short: Continue PLB when needed Long: Routine maintenance staying above 88% oxygen.             Initial Exercise Prescription:  Initial Exercise Prescription - 05/16/21 1600       Date of Initial Exercise RX and Referring Provider    Date 05/16/21    Referring Provider Patsey Berthold      Oxygen   Oxygen Continuous    Liters 3    Maintain Oxygen Saturation 88% or higher      Treadmill   MPH 1.5    Grade 0    Minutes 15    METs 2      Arm Ergometer   Level 1    RPM 25    Minutes 15    METs 2      Recumbant Elliptical   Level 1    RPM 50    Minutes 15    METs 2      REL-XR   Level 1    Speed 50    Minutes 15    METs 2      Track   Laps 20    Minutes 15    METs 2      Prescription Details   Frequency (times per week) 3    Duration Progress to 30 minutes of continuous aerobic without signs/symptoms of physical distress      Intensity   THRR 40-80% of Max Heartrate 110-135    Ratings of Perceived Exertion 11-15    Perceived Dyspnea 0-4      Resistance Training   Training Prescription Yes    Weight 3 lb    Reps 10-15             Perform Capillary Blood Glucose checks as needed.  Exercise Prescription Changes:   Exercise Prescription Changes     Row Name 05/16/21 1600 06/03/21 1400 06/18/21 0900 07/02/21 1400 07/15/21 1500     Response to Exercise   Blood Pressure (Admit) 110/58 116/60 102/62 134/70 110/60   Blood Pressure (Exercise) 114/58 138/70 124/60 -- --   Blood Pressure (Exit) 114/64 98/60 118/64 104/52 98/56   Heart Rate (Admit) 85 bpm 74 bpm 75 bpm 87 bpm  84 bpm   Heart Rate (Exercise) 92 bpm 78 bpm 85 bpm 103 bpm 79 bpm   Heart Rate (Exit) 90 bpm 66 bpm 88 bpm 95 bpm 75 bpm   Oxygen Saturation (Admit) 93 % 95 % 98 % 92 % 89 %   Oxygen Saturation (Exercise) 87 % 90 % 88 % 88 % 88 %   Oxygen Saturation (Exit) 95 % 94 % 91 % 94 % 90 %   Rating of Perceived Exertion (Exercise) _0 Perceived Dyspnea (Exercise) _1 Symptoms SOB -- SOB SOB SOB   Duration -- Continue with 30 min of aerobic exercise without signs/symptoms of physical distress. Continue with 30 min of aerobic exercise without signs/symptoms of physical distress. Continue with 30 min of aerobic  exercise without signs/symptoms of physical distress. Continue with 30 min of aerobic exercise without signs/symptoms of physical distress.   Intensity -- THRR unchanged THRR unchanged THRR unchanged THRR unchanged     Progression   Progression -- Continue to progress workloads to maintain intensity without signs/symptoms of physical distress. Continue to progress workloads to maintain intensity without signs/symptoms of physical distress. Continue to progress workloads to maintain intensity without signs/symptoms of physical distress. Continue to progress workloads to maintain intensity without signs/symptoms of physical distress.   Average METs -- 3.15 2.69 3.03 2.11     Resistance Training   Training Prescription -- Yes Yes Yes Yes   Weight -- 3 lb 3 lb 3 lb 4 lb   Reps -- 10-15 10-15 10-15 10-15     Interval Training   Interval Training -- -- No No No     Oxygen   Oxygen -- Continuous Continuous Continuous Continuous   Liters -- _2 Treadmill   MPH -- -- 1.9 1.6 1.6   Grade -- -- 0 0 0   Minutes -- -- _3 METs -- -- 2.45 2.23 2.23     Recumbant Bike   Level -- -- 1 -- --   Watts -- -- 16 -- --   Minutes -- -- 15 -- --   METs -- -- 2.59 -- --     NuStep   Level -- -- _4 Minutes -- -- _5 METs -- -- 3.1 3.42 2     REL-XR   Level -- _6 --   Minutes -- _7 --   METs -- 3.7 -- -- --     Track   Laps -- 30 -- 22 --   Minutes -- 15 -- 15 --   METs -- 2.63 -- 2.2 --     Home Exercise Plan   Plans to continue exercise at -- -- Home (comment)  walking and staff Youtube videos Home (comment)  walking and staff Youtube videos Home (comment)  walking and staff Youtube videos   Frequency -- -- Add 2 additional days to program exercise sessions.  Start with 1 Add 2 additional days to program exercise sessions.  Start with 1 Add 2 additional days to program exercise sessions.  Start with 1   Initial Home Exercises Provided -- -- 06/18/21  06/18/21 06/18/21     Oxygen   Maintain Oxygen Saturation -- 88% or higher 88% or higher 88% or higher 88% or higher    Row Name 07/29/21 0900 08/12/21 1300 08/26/21 0700 09/09/21 0700  Response to Exercise   Blood Pressure (Admit) 100/60 102/58 126/64 112/60    Blood Pressure (Exit) 98/60 110/66 100/56 122/62    Heart Rate (Admit) 62 bpm 75 bpm 80 bpm 73 bpm    Heart Rate (Exercise) 90 bpm 97 bpm 102 bpm 85 bpm    Heart Rate (Exit) 72 bpm 76 bpm 84 bpm 74 bpm    Oxygen Saturation (Admit) 91 % 96 % 90 % 88 %    Oxygen Saturation (Exercise) 88 % 88 % 88 % 88 %    Oxygen Saturation (Exit) 91 % 92 % 94 % 98 %    Rating of Perceived Exertion (Exercise) _0 Perceived Dyspnea (Exercise) _1 Symptoms SOB SOB SOB SOB    Duration Continue with 30 min of aerobic exercise without signs/symptoms of physical distress. Continue with 30 min of aerobic exercise without signs/symptoms of physical distress. Continue with 30 min of aerobic exercise without signs/symptoms of physical distress. Continue with 30 min of aerobic exercise without signs/symptoms of physical distress.    Intensity THRR unchanged THRR unchanged THRR unchanged THRR unchanged      Progression   Progression Continue to progress workloads to maintain intensity without signs/symptoms of physical distress. Continue to progress workloads to maintain intensity without signs/symptoms of physical distress. Continue to progress workloads to maintain intensity without signs/symptoms of physical distress. Continue to progress workloads to maintain intensity without signs/symptoms of physical distress.    Average METs 2.14 2.19 2.37 --      Resistance Training   Training Prescription Yes Yes Yes Yes    Weight 3 lb 3 lb 3 lb 3 lb    Reps 10-15 10-15 10-15 10-15      Interval Training   Interval Training Yes No No No      Oxygen   Oxygen Continuous Continuous Continuous Continuous    Liters _2 Treadmill    MPH -- 1.9 2 --    Grade -- 0 0 --    Minutes -- 15 15 --    METs -- 2.45 2.53 --      Recumbant Bike   Level 2 -- -- --    Minutes 15 -- -- --    METs 2.89 -- -- --      NuStep   Level _3 --    Minutes _4 --    METs 2.2 2.9 2.5 --      Arm Ergometer   Level _5 --    Minutes _6 --    METs 1 -- 1.9 --      REL-XR   Level _7 Minutes _8 METs 1.8 2 -- --      Track   Laps -- 12  12 laps in hallway = 10 laps on track -- --    Minutes -- 15 -- --    METs -- 1.54 -- --      Home Exercise Plan   Plans to continue exercise at Home (comment)  walking and staff Youtube videos Home (comment)  walking and staff Youtube videos Home (comment)  walking and staff Youtube videos Home (comment)  walking and staff Youtube videos    Frequency Add 2 additional days to program exercise sessions.  Start with 1 Add 2 additional days to  program exercise sessions.  Start with 1 Add 2 additional days to program exercise sessions.  Start with 1 Add 2 additional days to program exercise sessions.  Start with 1    Initial Home Exercises Provided 06/18/21 06/18/21 06/18/21 06/18/21      Oxygen   Maintain Oxygen Saturation 88% or higher 88% or higher 88% or higher 88% or higher             Exercise Comments:   Exercise Comments     Row Name 05/21/21 2297 06/18/21 0939         Exercise Comments First full day of exercise!  Patient was oriented to gym and equipment including functions, settings, policies, and procedures.  Patient's individual exercise prescription and treatment plan were reviewed.  All starting workloads were established based on the results of the 6 minute walk test done at initial orientation visit.  The plan for exercise progression was also introduced and progression will be customized based on patient's performance and goals. Reviewed home exercise with pt today.  Pt plans to walk and do staff Youtube videos for exercise.  Reviewed THR,  pulse, RPE, sign and symptoms, pulse oximetery and when to call 911 or MD.  Also discussed weather considerations and indoor options.  Pt voiced understanding.               Exercise Goals and Review:   Exercise Goals     Row Name 05/16/21 1645             Exercise Goals   Increase Physical Activity Yes       Intervention Provide advice, education, support and counseling about physical activity/exercise needs.;Develop an individualized exercise prescription for aerobic and resistive training based on initial evaluation findings, risk stratification, comorbidities and participant's personal goals.       Expected Outcomes Short Term: Attend rehab on a regular basis to increase amount of physical activity.;Long Term: Add in home exercise to make exercise part of routine and to increase amount of physical activity.;Long Term: Exercising regularly at least 3-5 days a week.       Increase Strength and Stamina Yes       Intervention Provide advice, education, support and counseling about physical activity/exercise needs.;Develop an individualized exercise prescription for aerobic and resistive training based on initial evaluation findings, risk stratification, comorbidities and participant's personal goals.       Expected Outcomes Short Term: Increase workloads from initial exercise prescription for resistance, speed, and METs.;Short Term: Perform resistance training exercises routinely during rehab and add in resistance training at home;Long Term: Improve cardiorespiratory fitness, muscular endurance and strength as measured by increased METs and functional capacity (6MWT)       Able to understand and use rate of perceived exertion (RPE) scale Yes       Intervention Provide education and explanation on how to use RPE scale       Expected Outcomes Short Term: Able to use RPE daily in rehab to express subjective intensity level;Long Term:  Able to use RPE to guide intensity level when exercising  independently       Able to understand and use Dyspnea scale Yes       Intervention Provide education and explanation on how to use Dyspnea scale       Expected Outcomes Short Term: Able to use Dyspnea scale daily in rehab to express subjective sense of shortness of breath during exertion;Long Term: Able to use Dyspnea scale to guide intensity level  when exercising independently       Knowledge and understanding of Target Heart Rate Range (THRR) Yes       Intervention Provide education and explanation of THRR including how the numbers were predicted and where they are located for reference       Expected Outcomes Short Term: Able to state/look up THRR;Short Term: Able to use daily as guideline for intensity in rehab;Long Term: Able to use THRR to govern intensity when exercising independently       Able to check pulse independently Yes       Intervention Provide education and demonstration on how to check pulse in carotid and radial arteries.;Review the importance of being able to check your own pulse for safety during independent exercise       Expected Outcomes Short Term: Able to explain why pulse checking is important during independent exercise;Long Term: Able to check pulse independently and accurately       Understanding of Exercise Prescription Yes       Intervention Provide education, explanation, and written materials on patient's individual exercise prescription       Expected Outcomes Short Term: Able to explain program exercise prescription;Long Term: Able to explain home exercise prescription to exercise independently                Exercise Goals Re-Evaluation :  Exercise Goals Re-Evaluation     Row Name 05/21/21 4944 06/03/21 1419 06/18/21 1343 07/02/21 1506 07/02/21 1509     Exercise Goal Re-Evaluation   Exercise Goals Review Increase Physical Activity;Able to understand and use rate of perceived exertion (RPE) scale;Knowledge and understanding of Target Heart Rate Range  (THRR);Understanding of Exercise Prescription;Increase Strength and Stamina;Able to check pulse independently;Able to understand and use Dyspnea scale Increase Physical Activity;Increase Strength and Stamina Increase Physical Activity;Increase Strength and Stamina;Understanding of Exercise Prescription Increase Physical Activity;Increase Strength and Stamina;Understanding of Exercise Prescription Increase Physical Activity;Increase Strength and Stamina;Understanding of Exercise Prescription   Comments Reviewed RPE and dyspnea scales, THR and program prescription with pt today.  Pt voiced understanding and was given a copy of goals to take home. Colbie is tolerating exercise well so far.  Oxygen has stayed in the 90s for most sessions.  She has done 30 laps on the track and has increased to 1.7 mph on TM.  We will continue to monitor progress. Reviewed home exercise with pt today.  Pt plans to walk and do staff Youtube videos for exercise. She is walking about 1/2 mile at a time at home. She checks her oxygen but has not been checking her HR.  Reviewed THR, pulse, RPE, sign and symptoms, pulse oximetery and when to call 911 or MD.  Also discussed weather considerations and indoor options.  Pt voiced understanding. Stasha is doing well in rehab. She has increased her average METs to [facility] is doing well in rehab. She has increased her average METs to 3.03. She improved to level 2 on the T4NS machine. Patient would benefit from increasing resistance to 4 lbs. We will continue to moniter her progress in rehab.   Expected Outcomes Short: Use RPE daily to regulate intensity. Long: Follow program prescription in THR. Short:  maintain consistent attendance Long:  improve overall stamina Short: Start checking HR during exercise using pulse ox  Long: Continue independence exercise with appropriate prescription -- Short: Increase resistance to 4 lbs.. Long: Progress to hit THR during exercise.    Fisher Name 07/15/21 1549  07/25/21 0955 07/29/21 9675 08/12/21  1358 08/22/21 0930     Exercise Goal Re-Evaluation   Exercise Goals Review Increase Physical Activity;Increase Strength and Stamina;Understanding of Exercise Prescription Increase Physical Activity;Increase Strength and Stamina;Understanding of Exercise Prescription Increase Physical Activity;Increase Strength and Stamina;Understanding of Exercise Prescription Increase Physical Activity;Increase Strength and Stamina;Understanding of Exercise Prescription Increase Physical Activity;Increase Strength and Stamina;Understanding of Exercise Prescription   Comments Jenita has been out since 5/11 after a hospital admission.  She will seek clearance from her doctor prior to returning to the program. Adaya was recently discharged from the hospital and has not done much exercise at home.  She is trying to get into the swing of things and is interested in joining the [place]- a brochure was provided. She recently exercised with [employer] videos at home but says they're too strenous for her. We discussed using our staff Youtube videos and to do a light load to start since she hasn't been in a normal exercise regimen. Her goal would be able to exercise with those videos down the road. She is checking her HR and O2. Keari is doing well in rehab. She is still getting back into the swing of things since being in the hospital. She did, however, increase to level 3 on both the T4 Nustep and XR. She has not been walking as much as staff will continue to encourage patient to try again. Will continue to monitor. Tyauna is doing well in rehab and will be due for her post 6MWT soon and we hope to see improvement. She did start walking again and was able to complete 12 laps on the hallway track and 1.9 mph on the treadmill. She worked at level 1 on the T4 last time and should increase next session. She would also benefit from increasing to 4 lb hand weights. Will continue to monitor.  Luvia has been swimming in the pool while on vacation. She has thought about going to the [facility] for exercise, encouraged her start exercising routinely outside of rehab to begin developing those habits.   Expected Outcomes Short: Clearance to return to program Long: Continue to improve stamina Short: Start 1 day of light exercise at home  Long: Continue to exercise independently at home or at gym facility Short: Try walking again Long: Continue to increase overall MET level Short: Increase handweights and level on T4, improve on post 6MWT Long: Continue to build up overall strength and stamina Short: exercise at least 2x/week outside of rehab Long: Continue to build up overall strength and stamina    Row Name 08/26/21 0756 09/09/21 0752 09/23/21 0816         Exercise Goal Re-Evaluation   Exercise Goals Review Increase Physical Activity;Increase Strength and Stamina;Understanding of Exercise Prescription Increase Physical Activity;Increase Strength and Stamina;Understanding of Exercise Prescription Increase Physical Activity;Increase Strength and Stamina;Understanding of Exercise Prescription     Comments Kyasia is doing well in rehab. She is back up to 2.37 average METs. She has also improved to 2 mph on the treadmill. She has tolerated 3 lb for resistance training as well. We will continue to monitor her progress in the program. Kathee is doing well in rehab, but has only attended rehab once since the last evaluation. She did complete her post 6 MWT and improved her distance by 80 ft. She also improved to level 2 on the XR. Makalya has tolerated using 3 lb hand weights for resistance training as well. We will continue to monitor her progress in the program until she graduates. Kisha  has not attended rehab since the last evaluation. We will reach out to pt to see if they want to finish the program.     Expected Outcomes Short: add incline on treadmill. Long: Continue to increase overall MET  levels. Short: Graduate. Long: Continue to exercise independently. Short: Graduate. Long: Continue to exercise independently.              Discharge Exercise Prescription (Final Exercise Prescription Changes):  Exercise Prescription Changes - 09/09/21 0700       Response to Exercise   Blood Pressure (Admit) 112/60    Blood Pressure (Exit) 122/62    Heart Rate (Admit) 73 bpm    Heart Rate (Exercise) 85 bpm    Heart Rate (Exit) 74 bpm    Oxygen Saturation (Admit) 88 %    Oxygen Saturation (Exercise) 88 %    Oxygen Saturation (Exit) 98 %    Rating of Perceived Exertion (Exercise) 13    Perceived Dyspnea (Exercise) 2    Symptoms SOB    Duration Continue with 30 min of aerobic exercise without signs/symptoms of physical distress.    Intensity THRR unchanged      Progression   Progression Continue to progress workloads to maintain intensity without signs/symptoms of physical distress.      Resistance Training   Training Prescription Yes    Weight 3 lb    Reps 10-15      Interval Training   Interval Training No      Oxygen   Oxygen Continuous    Liters 3      REL-XR   Level 2    Minutes 15      Home Exercise Plan   Plans to continue exercise at Home (comment)   walking and staff Youtube videos   Frequency Add 2 additional days to program exercise sessions.   Start with 1   Initial Home Exercises Provided 06/18/21      Oxygen   Maintain Oxygen Saturation 88% or higher             Nutrition:  Target Goals: Understanding of nutrition guidelines, daily intake of sodium <1533m, cholesterol <2061m calories 30% from fat and 7% or less from saturated fats, daily to have 5 or more servings of fruits and vegetables.  Education: All About Nutrition: -Group instruction provided by verbal, written material, interactive activities, discussions, models, and posters to present general guidelines for heart healthy nutrition including fat, fiber, MyPlate, the role of sodium  in heart healthy nutrition, utilization of the nutrition label, and utilization of this knowledge for meal planning. Follow up email sent as well. Written material given at graduation. [facility]  Date 07/18/21  [facility] Review Code 1- Verbalizes Understanding       Biometrics:  Pre Biometrics - 05/16/21 1646       Pre Biometrics   Height 5' 8.75" (1.746 m)    Weight 199 lb 11.2 oz (90.6 kg)    BMI (Calculated) 29.71    Single Leg Stand 4.55 seconds             Post Biometrics - 09/03/21 0941        Post  Biometrics   Height 5' 8.75" (1.746 m)    Weight 191 lb 3.2 oz (86.7 kg)    BMI (Calculated) 28.45    Single Leg Stand 1.8 seconds  Nutrition Therapy Plan and Nutrition Goals:  Nutrition Therapy & Goals - 05/28/21 1133       Nutrition Therapy   Diet Heart healthy, low Na, pulmonary MNT    Drug/Food Interactions Statins/Certain Fruits    Protein (specify units) 100g   1.2xkg (COPD)   Fiber 25 grams    Whole Grain Foods 3 servings    Saturated Fats 16 max. grams    Fruits and Vegetables 8 servings/day    Sodium 2 grams      Personal Nutrition Goals   Nutrition Goal ST: try some suggestions (Suggested cutting up fruits and vegetables while sitting down, roasting vegetables and chicken, using cut up vegetables to have salads more often, utilizing microwave ready grains, and replacing high caloire dips with low fat greek yogurt and hummus) in addiiton to trying out pre-made meals. Honor hunger.  LT: limit saturated fat < 16g/day, limit sodium <2g/day, eat 8 fruit/vegetables per day, make heart healthy changes that work for her    Comments 71 y.o. F admitted to pulmonary rehab for shortness of breath. PMHx includes CAD, HFpEF, COPD, GERD, osteoporosis, former smoker. Relevant medications includes lipitor, lasix, prozac, CA w/ vit D, omeprazole. PYP Score: 53. Vegetables &  Fruits 6/12. Breads, Grains & Cereals 9/12. Red & Processed Meat 8/12. Poultry 022. Fish & Shellfish 0/4. Beans, Nuts & Seeds 3/4. Milk & Dairy Foods 2/6. Toppings, Oils, Seasonings & Salt 6/20. Sweets, Snacks & Restaurant Food 9/14. Beverages 8/10.  B: [employer] (eggs with bacon - sometimes no bacon) and fruit L: Sandwiches (ham and cheese with whole grain bread), pretzels with 2 tbsp of pimento cheese, or salad. S: chicken salad and dips with chips. She has done weight watchers in the past and maintained weight for 3 years, since reduing activity from shortness of breath she has gained weight. Discussed honoring hunger and limiting excess calories. Discussed heart healthy eating and pulmonary MNT - will need to review again as time was limited. She would like to get meals she could put together like [employer] nutrition - advised avoiding calorie smart as they can be ~350kcal and keto as that would limit CHO and could also be lower in calories. Discussed some limitations with pre-made meals like less control, higher salt, and less whole grains. Suggested cutting up fruits and vegetables while sitting down, roasting vegetables and chicken, using cut up vegetables to have salads more often, utilizing microwave ready grains, and replacing high caloire dips with low fat greek yogurt and hummus.      Intervention Plan   Intervention Prescribe, educate and counsel regarding individualized specific dietary modifications aiming towards targeted core components such as weight, hypertension, lipid management, diabetes, heart failure and other comorbidities.;Nutrition handout(s) given to patient.    Expected Outcomes Short Term Goal: Understand basic principles of dietary content, such as calories, fat, sodium, cholesterol and nutrients.;Short Term Goal: A plan has been developed with personal nutrition goals set during dietitian appointment.;Long Term Goal: Adherence to prescribed nutrition plan.              Nutrition Assessments:  MEDIFICTS Score Key: ?70 Need to make dietary changes  40-70 Heart Healthy Diet ? 40 Therapeutic Level Cholesterol Diet  Flowsheet Row Pulmonary Rehab from 05/16/2021 in [facility] Cardiac and Pulmonary Rehab  Picture Your Plate Total Score on Admission 53      Picture Your Plate Scores: <28 Unhealthy dietary pattern with much room for improvement. 41-50 Dietary pattern unlikely to meet recommendations for good health and  room for improvement. 51-60 More healthful dietary pattern, with some room for improvement.  >60 Healthy dietary pattern, although there may be some specific behaviors that could be improved.   Nutrition Goals Re-Evaluation:  Nutrition Goals Re-Evaluation     Row Name 06/18/21 0957 07/25/21 0948 08/22/21 0925         Goals   Nutrition Goal ST: try some suggestions (Suggested cutting up fruits and vegetables while sitting down, roasting vegetables and chicken, using cut up vegetables to have salads more often, utilizing microwave ready grains, and replacing high caloire dips with low fat greek yogurt and hummus) in addiiton to trying out pre-made meals. Honor hunger.  LT: limit saturated fat < 16g/day, limit sodium <2g/day, eat 8 fruit/vegetables per day, make heart healthy changes that work for her ST: try some suggestions (Suggested cutting up fruits and vegetables while sitting down, roasting vegetables and chicken, using cut up vegetables to have salads more often, utilizing microwave ready grains, and replacing high caloire dips with low fat greek yogurt and hummus) in addiiton to trying out pre-made meals. Honor hunger.  LT: limit saturated fat < 16g/day, limit sodium <2g/day, eat 8 fruit/vegetables per day, make heart healthy changes that work for her ST: continue to practice meal-pre, include heart healthy carbohydrate source like sweet potatos, beans, and lentils, whole grains . Honor hunger.  LT: limit saturated fat < 16g/day, limit sodium  <2g/day, eat 8 fruit/vegetables per day, make heart healthy changes that work for her     Comment Cailan has changed on her portion sizes by limiting their size during her meals. She is trying out new frozen dinners and made her aware to be conscious of the sodium on the label. We talked about looking at [place] (consulted with the RD who is OK with recommendation, just noted to be mindful of how many carbs are in the meal if they are low, to pair her meal with an additional carb to meet her caloric needs). She has eliminated dips from her diet but has not tried any other alternatives. She admits it's hard for her to cut down on a couple glasses of wine which is one of her goals she's trying to meet. She is going to start measuring her ounces poured and keep track by slowly reducing her intake. She hasn't tried working on any other goals at this time and declined other questions. Amisadai admits she is not cooking as much as she used to. We talked about meal prep and pre-cutting vegetables. She tends to snack and eat smaller meals. She is limiting her portion size. She also cut down her wine intake from every night to only 3 times/ week which is a big improvement for her. She states she hasn't lost weight but feel like her clothes fit different. Hazelle reports reports doing well with her nutrition. She has been trying to cut back back portion sizes and has dropped some weight. Encouraged her to honor her hunger. She has cut out bread - encouraged her that carbohydrates provide energy and a lot of foods rich in carbohydrates are healthy like sweet potatos, beans, and lentils, whole grains. She has tried to prep food as well - she does not doing a lot of cooking right now due to her vacations, but she does cook chicken, she is limiting her red meat (1-2x/week), a lot more salads with yogurt dressing, drinking more water, cutting out diet pepsi. She does not like water as much even with  flavorings,  but she is drinking it.     Expected Outcome Short: Continue to follow goals established by RD Long: Continue to focus on a friendly pulmonary-based diet Short: Continue to follow goals established by RD Long: Continue to focus on a friendly pulmonary-based diet ST: continue to practice meal-pre, include heart healthy carbohydrate source like sweet potatos, beans, and lentils, whole grains . Honor hunger.  LT: limit saturated fat < 16g/day, limit sodium <2g/day, eat 8 fruit/vegetables per day, make heart healthy changes that work for her              Nutrition Goals Discharge (Final Nutrition Goals Re-Evaluation):  Nutrition Goals Re-Evaluation - 08/22/21 0925       Goals   Nutrition Goal ST: continue to practice meal-pre, include heart healthy carbohydrate source like sweet potatos, beans, and lentils, whole grains . Honor hunger.  LT: limit saturated fat < 16g/day, limit sodium <2g/day, eat 8 fruit/vegetables per day, make heart healthy changes that work for her    Comment Hector reports reports doing well with her nutrition. She has been trying to cut back back portion sizes and has dropped some weight. Encouraged her to honor her hunger. She has cut out bread - encouraged her that carbohydrates provide energy and a lot of foods rich in carbohydrates are healthy like sweet potatos, beans, and lentils, whole grains. She has tried to prep food as well - she does not doing a lot of cooking right now due to her vacations, but she does cook chicken, she is limiting her red meat (1-2x/week), a lot more salads with yogurt dressing, drinking more water, cutting out diet pepsi. She does not like water as much even with flavorings, but she is drinking it.    Expected Outcome ST: continue to practice meal-pre, include heart healthy carbohydrate source like sweet potatos, beans, and lentils, whole grains . Honor hunger.  LT: limit saturated fat < 16g/day, limit sodium <2g/day, eat 8 fruit/vegetables per day,  make heart healthy changes that work for her             Psychosocial: Target Goals: Acknowledge presence or absence of significant depression and/or stress, maximize coping skills, provide positive support system. Participant is able to verbalize types and ability to use techniques and skills needed for reducing stress and depression.   Education: Stress, Anxiety, and Depression - Group verbal and visual presentation to define topics covered.  Reviews how body is impacted by stress, anxiety, and depression.  Also discusses healthy ways to reduce stress and to treat/manage anxiety and depression.  Written material given at graduation. Flowsheet Row Pulmonary Rehab from 08/01/2021 in [facility] Cardiac and Pulmonary Rehab  Date 06/13/21  Educator [facility]  Instruction Review Code 1- [employer] Understanding       Education: Sleep Hygiene -Provides group verbal and written instruction about how sleep can affect your health.  Define sleep hygiene, discuss sleep cycles and impact of sleep habits. Review good sleep hygiene tips.    Initial Review & Psychosocial Screening:  Initial Psych Review & Screening - 05/08/21 1413       Initial Review   Source of Stress Concerns Unable to participate in former interests or hobbies;Unable to perform yard/household activities      [place]? Yes   family, church     Barriers   Psychosocial barriers to participate in program There are no identifiable barriers or psychosocial needs.;The patient should benefit from training in stress  management and relaxation.      Screening Interventions   Interventions Encouraged to exercise;To provide support and resources with identified psychosocial needs    Expected Outcomes Short Term goal: Utilizing psychosocial counselor, staff and physician to assist with identification of specific Stressors or current issues interfering with healing process. Setting desired goal for each stressor or  current issue identified.;Long Term Goal: Stressors or current issues are controlled or eliminated.;Short Term goal: Identification and review with participant of any Quality of Life or Depression concerns found by scoring the questionnaire.;Long Term goal: The participant improves quality of Life and PHQ9 Scores as seen by post scores and/or verbalization of changes             Quality of Life Scores:  Scores of 19 and below usually indicate a poorer quality of life in these areas.  A difference of  2-3 points is a clinically meaningful difference.  A difference of 2-3 points in the total score of the Quality of Life Index has been associated with significant improvement in overall quality of life, self-image, physical symptoms, and general health in studies assessing change in quality of life.  PHQ-9: Review Flowsheet  More data exists      09/17/2021 07/29/2021 05/16/2021 03/11/2021 03/09/2020  Depression screen PHQ 2/9  Decreased Interest 0 0 1 0 0  Down, Depressed, Hopeless 0 0 1 0 0  PHQ - 2 Score 0 0 2 0 0  Altered sleeping - - 0 - 0  Tired, decreased energy - - 3 - 0  Change in appetite - - 3 - 0  Feeling bad or failure about yourself  - - 1 - 0  Trouble concentrating - - 0 - 0  Moving slowly or fidgety/restless - - 2 - 0  Suicidal thoughts - - 0 - 0  PHQ-9 Score - - 11 - 0  Difficult doing work/chores - - Somewhat difficult - Not difficult at all   Interpretation of Total Score  Total Score Depression Severity:  1-4 = Minimal depression, 5-9 = Mild depression, 10-14 = Moderate depression, 15-19 = Moderately severe depression, 20-27 = Severe depression   Psychosocial Evaluation and Intervention:  Psychosocial Evaluation - 05/08/21 1418       Psychosocial Evaluation & Interventions   Interventions Encouraged to exercise with the program and follow exercise prescription    Comments Adamaris is coming to pulmonary rehab after worsening shortness of breath. She has a pacemaker  with associated heart history, back issues, orthostatic hypotension history, and other medical issues. She used to be quite active but her breathing has prevented her from continuing to do so. She really wants to get back into exercising and hopefully lose some weight that she has gained back recenlty. She has a great support system and is very active in her granddaughters' lives after her daughter passed away from cancer 5 years ago. She helps with getting them to school/sports and being supportive which is a huge motivator for her to work on her health    Expected Outcomes Short: attend pulmonary rehab for education and exercise. Long: develop and maintain positive self care habits.    Continue Psychosocial Services  Follow up required by staff             Psychosocial Re-Evaluation:  Psychosocial Re-Evaluation     [place] Name 06/18/21 1002 07/25/21 0951 08/22/21 0922         Psychosocial Re-Evaluation   Current issues with History of Depression History of Depression  History of Depression     Comments Lindzey is doing well overall mentally. Her daughter passed away 5 years ago and has been taking medications for her depression since and feels like she is managing it well. She admits she has harder days emotionally accepting that she is gone. She takes Ambien to help her sleep. She is not interested in pursuing anything else at this time. Husband is good support and she really enjoys hanging out with her grandchildren.  She is enjoying the program and feels stronger. Carlota is doing well mentally. Her daughter's passing is always a barrier for her at times but learns to manage with good and bad days. She is heading to the beach for 3 weeks in a couple of weeks with her familywhich she is excited about. Her birthday is tomorrow and she has been celebrating all week. She tries to keep herself occupied with family and friends. She is truly enjoying the program and feels it is helping with her  breathing. Jowana recently went on vacation with her family and enjoyed herself. She is leaving Sunday to go back to the beach - she loves the salt air and feels like it helps her mentally. She reports no stress at this time. She relies on her husband for support. She has no issues sleeping at this time.     Expected Outcomes Short: Continue routine attendance with rehab Long: Continue to maintain positive attitude Short: Enjoy beach time with family Long: Continue to utilize exercise for stress management Short: Enjoy beach time with family Long: Continue to utilize exercise for stress management     Interventions Encouraged to attend Pulmonary Rehabilitation for the exercise Encouraged to attend Pulmonary Rehabilitation for the exercise Encouraged to attend Pulmonary Rehabilitation for the exercise     Continue Psychosocial Services  Follow up required by staff Follow up required by staff Follow up required by staff              Psychosocial Discharge (Final Psychosocial Re-Evaluation):  Psychosocial Re-Evaluation - 08/22/21 0254       Psychosocial Re-Evaluation   Current issues with History of Depression    Comments Mckenzey recently went on vacation with her family and enjoyed herself. She is leaving Sunday to go back to the beach - she loves the salt air and feels like it helps her mentally. She reports no stress at this time. She relies on her husband for support. She has no issues sleeping at this time.    Expected Outcomes Short: Enjoy beach time with family Long: Continue to utilize exercise for stress management    Interventions Encouraged to attend Pulmonary Rehabilitation for the exercise    Continue Psychosocial Services  Follow up required by staff             Education: Education Goals: Education classes will be provided on a weekly basis, covering required topics. Participant will state understanding/return demonstration of topics presented.  Learning  Barriers/Preferences:  Learning Barriers/Preferences - 05/08/21 1421       Learning Barriers/Preferences   Learning Barriers None    Learning Preferences None             General Pulmonary Education Topics:  Infection Prevention: - Provides verbal and written material to individual with discussion of infection control including proper hand washing and proper equipment cleaning during exercise session. Flowsheet Row Pulmonary Rehab from 08/01/2021 in [facility] Cardiac and Pulmonary Rehab  Date 05/16/21  Educator AS  Instruction Review Code 1- [employer]  Understanding       Falls Prevention: - Provides verbal and written material to individual with discussion of falls prevention and safety. Flowsheet Row Pulmonary Rehab from 08/01/2021 in [facility] Cardiac and Pulmonary Rehab  Date 05/16/21  Educator AS  Instruction Review Code 1- Verbalizes Understanding       Chronic Lung Disease Review: - Group verbal instruction with posters, models, PowerPoint presentations and videos,  to review new updates, new respiratory medications, new advancements in procedures and treatments. Providing information on websites and "800" numbers for continued self-education. Includes information about supplement oxygen, available portable oxygen systems, continuous and intermittent flow rates, oxygen safety, concentrators, and Medicare reimbursement for oxygen. Explanation of Pulmonary Drugs, including class, frequency, complications, importance of spacers, rinsing mouth after steroid MDI's, and proper cleaning methods for nebulizers. Review of basic lung anatomy and physiology related to function, structure, and complications of lung disease. Review of risk factors. Discussion about methods for diagnosing sleep apnea and types of masks and machines for OSA. Includes a review of the use of types of environmental controls: home humidity, furnaces, filters, dust mite/pet prevention, HEPA vacuums. Discussion about  weather changes, air quality and the benefits of nasal washing. Instruction on Warning signs, infection symptoms, calling MD promptly, preventive modes, and value of vaccinations. Review of effective airway clearance, coughing and/or vibration techniques. Emphasizing that all should Create an Action Plan. Written material given at graduation. Flowsheet Row Pulmonary Rehab from 08/01/2021 in [facility] Cardiac and Pulmonary Rehab  Education need identified 05/16/21  Date 06/06/21  Educator [facility]  Instruction Review Code 1- Verbalizes Understanding       AED/CPR: - Group verbal and written instruction with the use of models to demonstrate the basic use of the AED with the basic ABC's of resuscitation.    Anatomy and Cardiac Procedures: - Group verbal and visual presentation and models provide information about basic cardiac anatomy and function. Reviews the testing methods done to diagnose heart disease and the outcomes of the test results. Describes the treatment choices: Medical Management, Angioplasty, or Coronary Bypass Surgery for treating various heart conditions including Myocardial Infarction, Angina, Valve Disease, and Cardiac Arrhythmias.  Written material given at graduation. Flowsheet Row Pulmonary Rehab from 08/01/2021 in [facility] Cardiac and Pulmonary Rehab  Date 07/04/21  Educator [facility]  Instruction Review Code 1- Verbalizes Understanding       Medication Safety: - Group verbal and visual instruction to review commonly prescribed medications for heart and lung disease. Reviews the medication, class of the drug, and side effects. Includes the steps to properly store meds and maintain the prescription regimen.  Written material given at graduation. Flowsheet Row Pulmonary Rehab from 08/01/2021 in [facility] Cardiac and Pulmonary Rehab  Date 05/23/21  Educator SB  Instruction Review Code 1- Verbalizes Understanding       Other: -Provides group and verbal instruction on various topics (see  comments)   Knowledge Questionnaire Score:  Knowledge Questionnaire Score - 05/16/21 1648       Knowledge Questionnaire Score   Pre Score 14/18              Core Components/Risk Factors/Patient Goals at Admission:  Personal Goals and Risk Factors at Admission - 05/16/21 1649       Core Components/Risk Factors/Patient Goals on Admission    Weight Management Yes;Weight Loss    Intervention Weight Management: Develop a combined nutrition and exercise program designed to reach desired caloric intake, while maintaining appropriate intake of nutrient and fiber, sodium and fats,  and appropriate energy expenditure required for the weight goal.;Weight Management: Provide education and appropriate resources to help participant work on and attain dietary goals.;Weight Management/Obesity: Establish reasonable short term and long term weight goals.    Expected Outcomes Short Term: Continue to assess and modify interventions until short term weight is achieved;Long Term: Adherence to nutrition and physical activity/exercise program aimed toward attainment of established weight goal;Weight Loss: Understanding of general recommendations for a balanced deficit meal plan, which promotes 1-2 lb weight loss per week and includes a negative energy balance of [phone] kcal/d;Understanding recommendations for meals to include 15-35% energy as protein, 25-35% energy from fat, 35-60% energy from carbohydrates, less than 235m of dietary cholesterol, 20-35 gm of total fiber daily;Understanding of distribution of calorie intake throughout the day with the consumption of 4-5 meals/snacks    Improve shortness of breath with ADL's Yes    Intervention Provide education, individualized exercise plan and daily activity instruction to help decrease symptoms of SOB with activities of daily living.    Expected Outcomes Short Term: Improve cardiorespiratory fitness to achieve a reduction of symptoms when performing ADLs;Long  Term: Be able to perform more ADLs without symptoms or delay the onset of symptoms    Increase knowledge of respiratory medications and ability to use respiratory devices properly  Yes    Intervention Provide education and demonstration as needed of appropriate use of medications, inhalers, and oxygen therapy.    Expected Outcomes Short Term: Achieves understanding of medications use. Understands that oxygen is a medication prescribed by physician. Demonstrates appropriate use of inhaler and oxygen therapy.;Long Term: Maintain appropriate use of medications, inhalers, and oxygen therapy.    Lipids Yes    Intervention Provide education and support for participant on nutrition & aerobic/resistive exercise along with prescribed medications to achieve LDL <71m HDL >4091m   Expected Outcomes Short Term: Participant states understanding of desired cholesterol values and is compliant with medications prescribed. Participant is following exercise prescription and nutrition guidelines.;Long Term: Cholesterol controlled with medications as prescribed, with individualized exercise RX and with personalized nutrition plan. Value goals: LDL < 58m37mDL > 40 mg.             Education:Diabetes - Individual verbal and written instruction to review signs/symptoms of diabetes, desired ranges of glucose level fasting, after meals and with exercise. Acknowledge that pre and post exercise glucose checks will be done for 3 sessions at entry of program.   Know Your Numbers and Heart Failure: - Group verbal and visual instruction to discuss disease risk factors for cardiac and pulmonary disease and treatment options.  Reviews associated critical values for Overweight/Obesity, Hypertension, Cholesterol, and Diabetes.  Discusses basics of heart failure: signs/symptoms and treatments.  Introduces Heart Failure Zone chart for action plan for heart failure.  Written material given at graduation. Flowsheet Row Pulmonary  [facility]  Date 05/30/21  Educator SB  Instruction Review Code 1- Verbalizes Understanding       Core Components/Risk Factors/Patient Goals Review:   Goals and Risk Factor Review     Row Name 06/18/21 1004 07/25/21 0943 08/22/21 0932         Core Components/Risk Factors/Patient Goals Review   Personal Goals Review Weight Management/Obesity;Improve shortness of breath with ADL's;Lipids Weight Management/Obesity;Improve shortness of breath with ADL's;Lipids Weight Management/Obesity;Improve shortness of breath with ADL's;Lipids     Review Tearsa has been doing well overall- she feels stronger and feels she has more stamina but  does not feel like her SOB has improved all that much.The pollen right now has been affecting her breathing when going outside and is trying to limit that as a trigger. She is motivated to stay active. She is staying compliant with taking all of her medications, including her respiratory ones. She has been wanting to lose weight but said her weight has been staying consistent. We talked about also focusing on how she feels & how her clothes fit as she could also be adding muscle mass. She is in the process of making dietary changes and limiting her wine intake. Not sure when her next lipid panel is due. Jhada is doing well. She feels her breathing  and strength has improved overall. Specifically, she states she can get out of the chair a lot easier than she used to. She feels her activites at home are getting easier, except vacuuming has always been a struggle. She is checking her weight consistently. Her doctor had switched her diuretic to prn due to low BP. She is not sure when to take it and will ask her doctor to clarify when to take it. She is aware to report any abnormal weight sudden weight gain. She feels her clothes fit her better overall. She is monitoring BP at home as it runs low sometimes. She keeps a log. Will is  doing well. She still feels her activites at home are getting easier, except vacuuming is still hard - she will stop and rest when her breathing gets worse. She continues checking her weight consistently, she feels she lost some weight and can feel a difference in the way her clothes are fitting. She is not consistently monitoring BP at home - encouraged her to check it routinely at home - her husband has a cuff. She continues to watch her O2 and HR at home and reports normal values. She feels her breathing has improved, but will still have good days and bad days. She is practicing PLB and doesn't feel a difference, but notices her O2 goes up. She is taking her medications as prescribed with no issues.     Expected Outcomes Short: Keep focusing on weight loss Long: Continue to manage lifestyle risk factors Short: Ask doctor about usage of diuretic Long: Continue to manage risk factors Short: check BP at home consistently Long: Continue to manage risk factors              Core Components/Risk Factors/Patient Goals at Discharge (Final Review):   Goals and Risk Factor Review - 08/22/21 0932       Core Components/Risk Factors/Patient Goals Review   Personal Goals Review Weight Management/Obesity;Improve shortness of breath with ADL's;Lipids    Review Bekka is doing well. She still feels her activites at home are getting easier, except vacuuming is still hard - she will stop and rest when her breathing gets worse. She continues checking her weight consistently, she feels she lost some weight and can feel a difference in the way her clothes are fitting. She is not consistently monitoring BP at home - encouraged her to check it routinely at home - her husband has a cuff. She continues to watch her O2 and HR at home and reports normal values. She feels her breathing has improved, but will still have good days and bad days. She is practicing PLB and doesn't feel a difference, but notices her O2 goes up. She  is taking her medications as prescribed with no issues.    Expected Outcomes  Short: check BP at home consistently Long: Continue to manage risk factors             ITP Comments:  ITP Comments     Row Name 05/08/21 1422 05/16/21 1651 05/21/21 0923 05/28/21 1130 06/12/21 1318   ITP Comments Initial telephone orientation completed. Diagnosis can be found in [facility] 2/7. EP orientation scheduled for Thursday 3/23 at 3pm. Completed 6MWT and gym orientation. Initial ITP created and sent for review to Dr. Ottie Glazier, Medical Director. First full day of exercise!  Patient was oriented to gym and equipment including functions, settings, policies, and procedures.  Patient's individual exercise prescription and treatment plan were reviewed.  All starting workloads were established based on the results of the 6 minute walk test done at initial orientation visit.  The plan for exercise progression was also introduced and progression will be customized based on patient's performance and goals. Completed initial RD consultation 30 Day review completed. Medical Director ITP review done, changes made as directed, and signed approval by Medical Director.    [place] Name 07/09/21 1539 07/10/21 0833 07/15/21 1548 08/07/21 1152 09/04/21 0806   ITP Comments Patient called to let us know she will not be at rehab this week as she was in the ED yesterday for a UTI and dehydration. She will call us next week to let us know how she is feeling. 30 Day review completed. Medical Director ITP review done, changes made as directed, and signed approval by Medical Director. Pt called today to update status.  She had an echo tomorrow and see her PCP on Wednesday.  She still was not feeling 100% but will seek release to return to rehab from her PCP. 30 Day review completed. Medical Director ITP review done, changes made as directed, and signed approval by Medical Director. 30 Day review completed. Medical Director ITP review done, changes  made as directed, and signed approval by Medical Director.    [place] Name 09/26/21 1453           ITP Comments Airlie has been in the hospital for health issues and is still recovering. Patient was advised to be discharged from the program for now and ask her doctor for a new referral when ready to return.                Comments: Discharge ITP

## 2021-09-30 ENCOUNTER — Other Ambulatory Visit: Payer: Self-pay | Admitting: Internal Medicine

## 2021-09-30 NOTE — Telephone Encounter (Signed)
Prescription refill request for Eliquis received. Indication: PAF Last office visit: 08/01/21  Read Drivers PA-C Scr: 1.02 on 09/17/21 Age: 71 Weight: 88.6kg  Based on above findings Eliquis 5mg  twice daily is the appropriate dose.  Refill approved.

## 2021-10-18 DIAGNOSIS — M5416 Radiculopathy, lumbar region: Secondary | ICD-10-CM | POA: Diagnosis not present

## 2021-10-18 DIAGNOSIS — M5136 Other intervertebral disc degeneration, lumbar region: Secondary | ICD-10-CM | POA: Diagnosis not present

## 2021-10-18 DIAGNOSIS — M8588 Other specified disorders of bone density and structure, other site: Secondary | ICD-10-CM | POA: Diagnosis not present

## 2021-10-18 DIAGNOSIS — M545 Low back pain, unspecified: Secondary | ICD-10-CM | POA: Diagnosis not present

## 2021-10-18 DIAGNOSIS — M48062 Spinal stenosis, lumbar region with neurogenic claudication: Secondary | ICD-10-CM | POA: Diagnosis not present

## 2021-10-22 ENCOUNTER — Other Ambulatory Visit: Payer: Self-pay | Admitting: Family Medicine

## 2021-10-23 NOTE — Telephone Encounter (Signed)
Pt called to see when would her prescription for zolpidem (AMBIEN) 10 MG tablet . Pt stated she's leaving for vacation tomorrow morning and needs medicine. Call back # is [phone]

## 2021-10-23 NOTE — Telephone Encounter (Signed)
Name of Medication: Lorrin Mais Name of Pharmacy: [facility] Date and Quantity: 06/18/21 #30 tab/ 3 refills Last Office Visit and Type: hospital f/u on 09/17/21 Next Office Visit and Type: none scheduled

## 2021-10-24 NOTE — Telephone Encounter (Signed)
Pt notified refill for zolpidem was sent in on 10/23/21. Pt will ck with pharmacy.

## 2021-10-24 NOTE — Telephone Encounter (Signed)
[place] Night - Client TELEPHONE ADVICE RECORD [facility] Patient Name: Diana Cook ALL Gender: Female DOB: November 06, 1950 Age: 71 Y 2 M 28 D Return Phone Number: [phone] (Primary) Address: City/ State/ Zip: [place] [address] Client [place] Night - Client Client Site [place] Provider [place], Roque Lias - MD Contact Type Call Who Is Calling Patient / Member / Family / Caregiver Call Type Triage / Clinical Relationship To Patient Self Return Phone Number [phone] (Primary) Chief Complaint Prescription Refill or Medication Request (non symptomatic) Reason for Call Medication Question / Request Initial Comment Caller states her name is Threasa Beards and she called yesterday regarding a refill on a medication from Dr. Glori Bickers. She doesn't have any medication to take tonight. Translation No Nurse Assessment Nurse: Ferdinand Lango, RN, Kenney Houseman Date/Time (Eastern Time): 10/23/2021 7:27:53 PM Please select the assessment type ---Refill Additional Documentation ---Caller states she is out of her medication and requested a refill yesterday but it was never sent in. Her pharmacy is currently closed Does the patient have enough medication to last until the office opens? ---Unable to obtain loaner dose from Pharmacy Does the client directives allow for assistance with medications after hours? ---No Additional Documentation ---Pt states she needs her Zolpidem refilled Disp. Time Eilene Ghazi Time) Disposition Final User 10/23/2021 7:29:46 PM Clinical Call Yes Ferdinand Lango RN, Kenney Houseman Final Disposition 10/23/2021 7:29:46 PM Clinical Call Yes Ferdinand Lango, RN, Nicole Kindred

## 2021-10-31 DIAGNOSIS — K219 Gastro-esophageal reflux disease without esophagitis: Secondary | ICD-10-CM | POA: Diagnosis not present

## 2021-10-31 DIAGNOSIS — J381 Polyp of vocal cord and larynx: Secondary | ICD-10-CM | POA: Diagnosis not present

## 2021-10-31 DIAGNOSIS — R053 Chronic cough: Secondary | ICD-10-CM | POA: Diagnosis not present

## 2021-11-03 ENCOUNTER — Telehealth: Payer: Self-pay | Admitting: Family Medicine

## 2021-11-03 DIAGNOSIS — D649 Anemia, unspecified: Secondary | ICD-10-CM | POA: Insufficient documentation

## 2021-11-03 NOTE — Telephone Encounter (Signed)
-----   Message from Ellamae Sia sent at 10/22/2021 11:57 AM EDT ----- Regarding: Lab orders for Monday 9.11.23 Lab orders, thanks

## 2021-11-04 ENCOUNTER — Other Ambulatory Visit (INDEPENDENT_AMBULATORY_CARE_PROVIDER_SITE_OTHER): Payer: Medicare Other

## 2021-11-04 DIAGNOSIS — D649 Anemia, unspecified: Secondary | ICD-10-CM | POA: Diagnosis not present

## 2021-11-04 LAB — CBC WITH DIFFERENTIAL/PLATELET
Basophils Absolute: 0.1 10*3/uL (ref 0.0–0.1)
Basophils Relative: 1.1 % (ref 0.0–3.0)
Eosinophils Absolute: 0.2 10*3/uL (ref 0.0–0.7)
Eosinophils Relative: 3.3 % (ref 0.0–5.0)
HCT: 37.2 % (ref 36.0–46.0)
Hemoglobin: 12.4 g/dL (ref 12.0–15.0)
Lymphocytes Relative: 20.2 % (ref 12.0–46.0)
Lymphs Abs: 1.2 10*3/uL (ref 0.7–4.0)
MCHC: 33.3 g/dL (ref 30.0–36.0)
MCV: 90 fl (ref 78.0–100.0)
Monocytes Absolute: 0.5 10*3/uL (ref 0.1–1.0)
Monocytes Relative: 8.1 % (ref 3.0–12.0)
Neutro Abs: 4 10*3/uL (ref 1.4–7.7)
Neutrophils Relative %: 67.3 % (ref 43.0–77.0)
Platelets: 244 10*3/uL (ref 150.0–400.0)
RBC: 4.13 Mil/uL (ref 3.87–5.11)
RDW: 14.3 % (ref 11.5–15.5)
WBC: 6 10*3/uL (ref 4.0–10.5)

## 2021-11-04 LAB — IRON: Iron: 74 ug/dL (ref 42–145)

## 2021-11-04 LAB — FERRITIN: Ferritin: 113.2 ng/mL (ref 10.0–291.0)

## 2021-11-05 ENCOUNTER — Ambulatory Visit
Admission: RE | Admit: 2021-11-05 | Discharge: 2021-11-05 | Disposition: A | Discharge status: Home / Self Care | Payer: Medicare Other | Source: Ambulatory Visit | Attending: Family Medicine | Admitting: Family Medicine

## 2021-11-05 DIAGNOSIS — E041 Nontoxic single thyroid nodule: Secondary | ICD-10-CM | POA: Diagnosis not present

## 2021-11-06 ENCOUNTER — Telehealth: Payer: Self-pay | Admitting: Family Medicine

## 2021-11-06 DIAGNOSIS — E041 Nontoxic single thyroid nodule: Secondary | ICD-10-CM

## 2021-11-06 NOTE — Telephone Encounter (Signed)
Referral is done  Please call if you don't hear in 1-2 weeks

## 2021-11-06 NOTE — Telephone Encounter (Signed)
-----   Message from Tammi Sou, [place] sent at 11/06/2021 12:28 PM EDT ----- Pt viewed results via mychart and agreed with referral, if possible would like to see someone in [place]

## 2021-11-07 ENCOUNTER — Other Ambulatory Visit: Payer: Self-pay | Admitting: Internal Medicine

## 2021-11-19 DIAGNOSIS — M5416 Radiculopathy, lumbar region: Secondary | ICD-10-CM | POA: Diagnosis not present

## 2021-11-19 DIAGNOSIS — M48062 Spinal stenosis, lumbar region with neurogenic claudication: Secondary | ICD-10-CM | POA: Diagnosis not present

## 2021-11-20 ENCOUNTER — Other Ambulatory Visit: Payer: Self-pay | Admitting: Pulmonary Disease

## 2021-11-20 NOTE — Telephone Encounter (Signed)
Will route to [place] to f/u on referral and PCP

## 2021-11-20 NOTE — Telephone Encounter (Signed)
Patient stated that they are not accepting new patients, [facility] Dr. Lucilla Lame [phone]  and they had no record of the referral. Call back number [phone].

## 2021-11-20 NOTE — Telephone Encounter (Signed)
We may have to go with out of town practice like [facility] or [place] -is that ok?   It may be likely that no one is taking new pt locally

## 2021-11-21 ENCOUNTER — Telehealth: Payer: Self-pay | Admitting: Family Medicine

## 2021-11-21 NOTE — Telephone Encounter (Signed)
Pt said her friend had this same issue and couldn't get in with local Endo so her provider sent her to an Endo in [place] that is accepting new pt. Pt's friend gave her the name of provider, she will get info and give it to [place] so PCP can put new referral in to him.  Pt will either call back or send a mychart with his info

## 2021-11-21 NOTE — Telephone Encounter (Signed)
Pt called wanting to give dr tower, a update on a new oncologist she would like to see. Dr's name is Carmin Muskrat @ Division of surgical oncology in [place]. Call back # is [phone]

## 2021-11-22 NOTE — Telephone Encounter (Signed)
See other phone note

## 2021-11-22 NOTE — Telephone Encounter (Signed)
Oncologist or endocrinologist- sorry, I got confused (see prev message)

## 2021-11-25 ENCOUNTER — Encounter: Payer: Self-pay | Admitting: Family Medicine

## 2021-11-25 ENCOUNTER — Encounter: Payer: Self-pay | Admitting: *Deleted

## 2021-11-25 ENCOUNTER — Ambulatory Visit (INDEPENDENT_AMBULATORY_CARE_PROVIDER_SITE_OTHER): Payer: Medicare Other | Admitting: Family Medicine

## 2021-11-25 ENCOUNTER — Ambulatory Visit (INDEPENDENT_AMBULATORY_CARE_PROVIDER_SITE_OTHER)
Admission: RE | Admit: 2021-11-25 | Discharge: 2021-11-25 | Disposition: A | Discharge status: Home / Self Care | Payer: Medicare Other | Source: Ambulatory Visit | Attending: Family Medicine | Admitting: Family Medicine

## 2021-11-25 VITALS — BP 124/68 | HR 68 | Temp 97.6°F | Ht 68.0 in | Wt 190.8 lb

## 2021-11-25 DIAGNOSIS — M25511 Pain in right shoulder: Secondary | ICD-10-CM

## 2021-11-25 DIAGNOSIS — I2510 Atherosclerotic heart disease of native coronary artery without angina pectoris: Secondary | ICD-10-CM | POA: Diagnosis not present

## 2021-11-25 DIAGNOSIS — E041 Nontoxic single thyroid nodule: Secondary | ICD-10-CM | POA: Diagnosis not present

## 2021-11-25 DIAGNOSIS — W19XXXA Unspecified fall, initial encounter: Secondary | ICD-10-CM

## 2021-11-25 DIAGNOSIS — Y92009 Unspecified place in unspecified non-institutional (private) residence as the place of occurrence of the external cause: Secondary | ICD-10-CM | POA: Insufficient documentation

## 2021-11-25 MED ORDER — TRAMADOL HCL 50 MG PO TABS: 50.0000 mg | ORAL_TABLET | Freq: Three times a day (TID) | ORAL | 0 refills | Status: AC | PRN

## 2021-11-25 NOTE — Assessment & Plan Note (Signed)
Ref done to endocrinology at [facility] for thyroid nodule bx

## 2021-11-25 NOTE — Telephone Encounter (Signed)
Was she also wanting referral to new oncologist or just endocrinologist (in [place]) Sorry, I got confused (see last 2 phone notes)

## 2021-11-25 NOTE — Patient Instructions (Addendum)
Use ice on shoulder   Get some voltaren gel 1% over the counter   You can take tylenol 2 pills up to every 4-6 hours  If it is the tylenol arthritis- every 8 hours   I will send in tramadol to use with caution of sedation   I will refer to orthopedics in [place] will get a call  If you don't hear this week let us know   I will place the surgical referral for the thyroid nodule

## 2021-11-25 NOTE — Telephone Encounter (Signed)
Addressed with pt directly at [facility] today

## 2021-11-25 NOTE — Assessment & Plan Note (Signed)
Tripped on pillows on floor  Contused L knee and R hip  R elbow injury-no fx (see a/p)  Disc fall prevention today

## 2021-11-25 NOTE — Progress Notes (Signed)
Subjective:    Patient ID: Diana Cook, female    DOB: 1950-03-12, 71 y.o.   MRN: [id number]  HPI Pt presents for injuries after a fall   Wt Readings from Last 3 Encounters:  11/25/21 190 lb 12.8 oz (86.5 kg)  09/23/21 185 lb 8 oz (84.1 kg)  09/17/21 190 lb 9.6 oz (86.5 kg)   29.01 kg/m  This am  Was getting out of bed and got feet caught in pillows and fell on L knee and then R arm and side  In a sling Not able to move arm well  Shoots from there to chest wall also  Some pain to take a deep breath- not enough to be a broken rib   R shoulder hurts - on lateral shoulder  No swelling or bruising  Hurt a lot - lay there and could not get up  Was able to eventually get herself up   Did not hit her head   Legs -L knee Side of R leg   Took some tylenol   XR report today  FINDINGS: Minimal inferior glenoid degenerative osteophytosis. Minimal glenohumeral joint space narrowing. Moderate acromioclavicular joint space narrowing and mild peripheral osteophytosis. Partial visualization of cardiac pacer. No acute fracture or dislocation.   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 No acute fracture.   Cannot take nsaids to to eliquis   Patient Active Problem List   Diagnosis Date Noted   Fall at home 11/25/2021   Right shoulder pain 11/25/2021   Mild anemia 11/03/2021   Fall 09/23/2021   Thyroid nodule 09/17/2021   Hyponatremia 09/17/2021   Sepsis due to gram-negative UTI ([place]) 09/05/2021   Dyslipidemia 09/05/2021   Depression 09/05/2021   GERD without esophagitis 09/05/2021   Vitamin D deficiency 08/20/2021   Colon cancer screening 07/30/2021   Hypercalcemia 07/29/2021   Diarrhea 07/29/2021   UTI (urinary tract infection) 07/17/2021   AKI (acute kidney injury) ([place]) 07/17/2021   Dysuria 07/08/2021   Arterial hypotension 07/08/2021   Paroxysmal atrial fibrillation ([place]) 04/11/2021   Current use of proton pump  inhibitor 10/24/2020   Sleep apnea 10/24/2020   Fatigue 10/24/2020   Lightheadedness 05/16/2020   Radicular pain in left arm 03/02/2020   Neck pain on left side 02/15/2020   Paresthesia 02/15/2020   Chronic respiratory failure with hypoxia ([place]) 11/17/2019   Chronic heart failure with preserved ejection fraction (HFpEF) ([place]) 08/11/2019   Coronary artery disease involving native coronary artery of native heart without angina pectoris 06/20/2019   Gastroesophageal reflux disease    Stomach irritation    Abnormal CT scan, esophagus    Polyp of ascending colon    Personal history of tobacco use, presenting hazards to health 01/18/2018   Grade I diastolic dysfunction 49/67/5916   Globus pharyngeus 12/21/2017   Chronic obstructive pulmonary disease ([place]) 12/15/2017   Hyperlipidemia LDL goal <70 12/07/2017   Seizures ([place]) 10/01/2016   Elevated glucose 08/31/2016   Grief reaction 04/11/2016   History of breast cancer 12/11/2015   Osteoporosis 11/22/2015   Estrogen deficiency 09/07/2015   Bruising 09/07/2015   Wrinkles 09/07/2015   Post herpetic neuralgia 09/01/2014   Encounter for Medicare annual wellness exam 07/11/2014   Routine general medical examination at a health care facility 06/26/2013   H/O small bowel obstruction 04/15/2013   Dyspnea on exertion 02/10/2013   Complete heart block ([place]) 11/03/2011   Pacemaker-[employer] 11/03/2011   Lumbar spinal stenosis 09/02/2011   POSTMENOPAUSAL STATUS 08/06/2009  HIDRADENITIS SUPPURATIVA 06/26/2008   HERNIATED [place] 12/14/2007   BACK, LOWER, PAIN 12/02/2007   Hyperkalemia 08/06/2007   HX, PERSONAL, COLONIC POLYPS 09/14/2006   Former smoker 06/10/2006   ASTHMA 06/10/2006   H/O idiopathic seizure 06/10/2006   Past Medical History:  Diagnosis Date   Allergic rhinitis    Arthritis    Asthma    as a child, mild now   Breast cancer ([place]) 12/2015   right breast cancer, lumpectomy and mammosite    Colon polyps     colonoscopy 7/08, tubular adenoma   Complete heart block ([place]) 09/2011   s/p PPM implanted in [facility]   COPD (chronic obstructive pulmonary disease) ([place])    Myocardial infarction ([place]) 2011   Osteopenia 10/2015   Pacemaker    2011   Paroxysmal atrial fibrillation ([place]) 03/2021   Incidentally detected on pacemaker interrogation   Personal history of radiation therapy 2017   right breast ca, mammosite placed   Seizure disorder ([place])    Seizures ([place])    first one was when she was 71 years old    Small bowel obstruction ([place])    1988 and 2002   Tobacco abuse    Past Surgical History:  Procedure Laterality Date   ABDOMINAL HYSTERECTOMY     BREAST BIOPSY Right 2007   benign inflammatory changes, mass due to underwire bra   BREAST BIOPSY Left 01/02/2016   columnar cell changes without atypical hyperplasia.   BREAST BIOPSY Right 12/06/2015   rt breast mass 10:00, bx done at Dr. Curly [place] office, invasive ductal carcinoma   BREAST EXCISIONAL BIOPSY Left 01/02/2016   COLUMNAR CELL CHANGE AND HYPERPLASIA ASSOCIATED WITH LUMINAL AND STROMAL CALCIFICATIONS   BREAST LUMPECTOMY Right 01/02/2016   invasive mammary carcinoma, clear margins, negative LN   BREAST LUMPECTOMY WITH SENTINEL LYMPH NODE BIOPSY Right 01/02/2016   pT1c, N0; ER/ PR 100%; Her 2 neu not over expressed: BREAST LUMPECTOMY WITH SENTINEL LYMPH NODE BX;  Surgeon: [name], MD;  Location: [facility] ORS;  Service: General;  Laterality: Right;   CARDIAC CATHETERIZATION     CATARACT EXTRACTION Bilateral    COLONOSCOPY  10/2015   Dr Ardis Hughs   COLONOSCOPY WITH PROPOFOL N/A 08/19/2018   Procedure: COLONOSCOPY WITH PROPOFOL;  Surgeon: Virgel Manifold, MD;  Location: [facility] ENDOSCOPY;  Service: Endoscopy;  Laterality: N/A;   ESOPHAGOGASTRODUODENOSCOPY (EGD) WITH PROPOFOL N/A 08/19/2018   Procedure: ESOPHAGOGASTRODUODENOSCOPY (EGD) WITH PROPOFOL;  Surgeon: Virgel Manifold, MD;  Location: [facility] ENDOSCOPY;  Service:  Endoscopy;  Laterality: N/A;   EXPLORATORY LAPAROTOMY  01/25/2001   Exploratory laparotomy, lysis of adhesions, identification of internal hernia secondary to omental adhesion. Prolonged postoperative ileus.   gyn surgery  1993   hysterectomy- form endometriosis   LAPAROSCOPY     PACEMAKER INSERTION  10/24/11   [employer] Advantio dual chamber PPM implanted by Dr Bunnie Philips at [facility] in [place] N/A 12/05/2019   Procedure: [place];  Surgeon: Deboraha Sprang, MD;  Location: [facility];  Service: Cardiovascular;  Laterality: N/A;   RIGHT/LEFT HEART CATH AND CORONARY ANGIOGRAPHY Bilateral 07/19/2019   Procedure: RIGHT/LEFT HEART CATH AND CORONARY ANGIOGRAPHY;  Surgeon: Nelva Bush, MD;  Location: [facility];  Service: Cardiovascular;  Laterality: Bilateral;   TEMPORARY PACEMAKER N/A 12/05/2019   Procedure: TEMPORARY PACEMAKER;  Surgeon: Deboraha Sprang, MD;  Location: [facility];  Service: Cardiovascular;  Laterality: N/A;  Social History   Tobacco Use   Smoking status: Former    Packs/day: 1.50    Years: 42.00    Total pack years: 63.00    Types: Cigarettes    Quit date: 11/11/2010    Years since quitting: 11.0   Smokeless tobacco: Never  Vaping Use   Vaping Use: Never used  Substance Use Topics   Alcohol use: Yes    Alcohol/week: 2.0 standard drinks of alcohol    Types: 2 Glasses of wine per week    Comment: weekly   Drug use: No   Family History  Problem Relation Age of Onset   Stroke Mother    Heart disease Mother 9       MI and CABG   Dementia Mother    Coronary artery disease Father    Parkinsonism Father    Cancer - Cervical Daughter 61       died 03-19-2022   Heart attack Brother 11   Colon cancer Neg Hx    Breast cancer Neg Hx    Allergies  Allergen Reactions   Codeine Nausea And Vomiting   Morphine And Related Nausea Only   Current Outpatient Medications on File  Prior to Visit  Medication Sig Dispense Refill   acetaminophen (TYLENOL) 500 MG tablet Take 500-1,000 mg by mouth every 6 (six) hours as needed for mild pain or headache.      albuterol (VENTOLIN HFA) 108 (90 Base) MCG/ACT inhaler Inhale 2 puffs into the lungs every 4 (four) hours as needed for wheezing or shortness of breath. 18 g 1   apixaban (ELIQUIS) 5 MG TABS tablet Take 1 tablet by mouth twice daily 180 tablet 1   atorvastatin (LIPITOR) 40 MG tablet Take 1 tablet by mouth once daily 90 tablet 2   benzonatate (TESSALON) 100 MG capsule Take by mouth as needed.     feeding supplement (ENSURE ENLIVE / ENSURE PLUS) LIQD Take 237 mLs by mouth 3 (three) times daily between meals. 237 mL 12   FLUoxetine (PROZAC) 20 MG capsule Take 1 capsule by mouth once daily 90 capsule 3   fluticasone (FLONASE) 50 MCG/ACT nasal spray Place 1 spray into both nostrils daily.  2   furosemide (LASIX) 40 MG tablet Take 1 tablet (40 mg total) by mouth daily as needed (swelling, wt gain or needing more oxygen). 90 tablet 0   levETIRAcetam (KEPPRA) 250 MG tablet Take 250 mg by mouth 2 (two) times daily.      loratadine (CLARITIN) 10 MG tablet Take 1 tablet (10 mg total) by mouth daily.     Multiple Vitamin (MULTIVITAMIN WITH MINERALS) TABS tablet Take 1 tablet by mouth daily.     omeprazole (PRILOSEC) 40 MG capsule Take 40 mg by mouth daily.     OXYGEN Inhale 3 L into the lungs at bedtime.      STIOLTO RESPIMAT 2.5-2.5 MCG/ACT AERS INHALE 2 PUFFS BY MOUTH ONCE DAILY 4 g 0   tretinoin (RETIN-A) 0.1 % cream Apply topically at bedtime. 45 g 1   VITAMIN D PO Take 1 tablet by mouth daily.     zolpidem (AMBIEN) 10 MG tablet TAKE 1/2 TO 1 (ONE-HALF TO ONE) TABLET BY MOUTH AT BEDTIME AS NEEDED FOR SLEEP 30 tablet 3   No current facility-administered medications on file prior to visit.    Review of Systems  Constitutional:  Negative for activity change, appetite change, fatigue, fever and unexpected weight change.  HENT:   Negative for congestion, ear  pain, rhinorrhea, sinus pressure and sore throat.   Eyes:  Negative for pain, redness and visual disturbance.  Respiratory:  Negative for cough, shortness of breath and wheezing.   Cardiovascular:  Negative for chest pain and palpitations.  Gastrointestinal:  Negative for abdominal pain, blood in stool, constipation and diarrhea.  Endocrine: Negative for polydipsia and polyuria.  Genitourinary:  Negative for dysuria, frequency and urgency.  Musculoskeletal:  Positive for arthralgias and myalgias. Negative for back pain.  Skin:  Negative for pallor and rash.  Allergic/Immunologic: Negative for environmental allergies.  Neurological:  Negative for dizziness, syncope and headaches.  Hematological:  Negative for adenopathy. Does not bruise/bleed easily.  Psychiatric/Behavioral:  Negative for decreased concentration and dysphoric mood. The patient is not nervous/anxious.        Objective:   Physical Exam Constitutional:      General: She is not in acute distress.    Appearance: Normal appearance. She is normal weight. She is not ill-appearing or diaphoretic.  HENT:     Head: Normocephalic and atraumatic.  Eyes:     General: No scleral icterus.       Right eye: No discharge.        Left eye: No discharge.     Conjunctiva/sclera: Conjunctivae normal.     Pupils: Pupils are equal, round, and reactive to light.  Cardiovascular:     Rate and Rhythm: Normal rate and regular rhythm.  Pulmonary:     Effort: Pulmonary effort is normal. No respiratory distress.     Breath sounds: Normal breath sounds. No wheezing.  Musculoskeletal:     Cervical back: No tenderness.     Comments: Unable to raise R arm or abduct due to pain (holds close to body) No swelling or obv dislocation  No erythema or ecchymosis  Tender to palpation laterally  Also at proximal bicep  (bicep does not appear abn)  Mild tenderness of anterior L knee  No ecchymosis  Small effusion medially  (per pt had this before) Nl rom and no crepitus  Not unstable   R hip-nl flex  Some mild pain on int rotation  Nl ext rotation  Bears weight on both legs without problem     Lymphadenopathy:     Cervical: No cervical adenopathy.  Skin:    General: Skin is warm and dry.     Findings: No bruising, erythema or rash.  Neurological:     Mental Status: She is alert.     Sensory: No sensory deficit.  Psychiatric:        Mood and Affect: Mood normal.           Assessment & Plan:   Problem List Items Addressed This Visit       Endocrine   Thyroid nodule    Ref done to endocrinology at [facility] for thyroid nodule bx        Other   Fall at home    Tripped on pillows on floor  Contused L knee and R hip  R elbow injury-no fx (see a/p)  Disc fall prevention today        Right shoulder pain - Primary    Shoulder injury after fall on it early today  Limited rom due to pain  Xray showed no fracture (does have arthritis)  Disc use of ice and voltaren gel otc (no oral nsaid)  Sling for 1-2 d max  Tramadol px for more severe pain/ she has taken this before  Ref to orthopedics for  further eval (rotator cuff injury or tear is possible) ER parameters noted       Relevant Orders   DG Shoulder Right (Completed)

## 2021-11-25 NOTE — Assessment & Plan Note (Signed)
Shoulder injury after fall on it early today  Limited rom due to pain  Xray showed no fracture (does have arthritis)  Disc use of ice and voltaren gel otc (no oral nsaid)  Sling for 1-2 d max  Tramadol px for more severe pain/ she has taken this before  Ref to orthopedics for further eval (rotator cuff injury or tear is possible) ER parameters noted

## 2021-11-26 ENCOUNTER — Encounter: Payer: Self-pay | Admitting: *Deleted

## 2021-11-27 ENCOUNTER — Other Ambulatory Visit: Payer: Self-pay | Admitting: General Surgery

## 2021-11-27 DIAGNOSIS — Z1231 Encounter for screening mammogram for malignant neoplasm of breast: Secondary | ICD-10-CM

## 2021-11-27 DIAGNOSIS — Z170 Estrogen receptor positive status [ER+]: Secondary | ICD-10-CM

## 2021-12-02 DIAGNOSIS — M67813 Other specified disorders of tendon, right shoulder: Secondary | ICD-10-CM | POA: Diagnosis not present

## 2021-12-05 ENCOUNTER — Ambulatory Visit (INDEPENDENT_AMBULATORY_CARE_PROVIDER_SITE_OTHER): Payer: Medicare Other

## 2021-12-05 DIAGNOSIS — I442 Atrioventricular block, complete: Secondary | ICD-10-CM

## 2021-12-06 LAB — CUP PACEART REMOTE DEVICE CHECK
Battery Remaining Longevity: 144 mo
Battery Remaining Percentage: 100 %
Brady Statistic RA Percent Paced: 4 %
Brady Statistic RV Percent Paced: 100 %
Date Time Interrogation Session: 20231013104300
Implantable Lead Implant Date: 20130830
Implantable Lead Implant Date: 20130830
Implantable Lead Location: 753859
Implantable Lead Location: 753860
Implantable Lead Model: 4456
Implantable Lead Model: 4479
Implantable Lead Serial Number: 473325
Implantable Lead Serial Number: 523784
Implantable Pulse Generator Implant Date: 20211011
Lead Channel Impedance Value: 454 Ohm
Lead Channel Impedance Value: 619 Ohm
Lead Channel Pacing Threshold Amplitude: 0.4 V
Lead Channel Pacing Threshold Pulse Width: 0.4 ms
Lead Channel Setting Pacing Amplitude: 2 V
Lead Channel Setting Pacing Amplitude: 2 V
Lead Channel Setting Pacing Pulse Width: 0.4 ms
Lead Channel Setting Sensing Sensitivity: 2.5 mV
Pulse Gen Serial Number: 949303

## 2021-12-09 DIAGNOSIS — M67813 Other specified disorders of tendon, right shoulder: Secondary | ICD-10-CM | POA: Diagnosis not present

## 2021-12-10 ENCOUNTER — Other Ambulatory Visit: Payer: Self-pay | Admitting: Orthopedic Surgery

## 2021-12-10 ENCOUNTER — Ambulatory Visit
Admission: RE | Admit: 2021-12-10 | Discharge: 2021-12-10 | Disposition: A | Discharge status: Home / Self Care | Payer: Medicare Other | Source: Ambulatory Visit | Attending: Pulmonary Disease | Admitting: Pulmonary Disease

## 2021-12-10 DIAGNOSIS — R911 Solitary pulmonary nodule: Secondary | ICD-10-CM | POA: Diagnosis not present

## 2021-12-10 DIAGNOSIS — M67813 Other specified disorders of tendon, right shoulder: Secondary | ICD-10-CM

## 2021-12-12 NOTE — Progress Notes (Signed)
Remote pacemaker transmission.   

## 2021-12-16 ENCOUNTER — Encounter: Payer: Self-pay | Admitting: Family Medicine

## 2021-12-16 ENCOUNTER — Ambulatory Visit (INDEPENDENT_AMBULATORY_CARE_PROVIDER_SITE_OTHER): Payer: Medicare Other | Admitting: Family Medicine

## 2021-12-16 DIAGNOSIS — J069 Acute upper respiratory infection, unspecified: Secondary | ICD-10-CM

## 2021-12-16 DIAGNOSIS — I2510 Atherosclerotic heart disease of native coronary artery without angina pectoris: Secondary | ICD-10-CM

## 2021-12-16 MED ORDER — PREDNISONE 20 MG PO TABS: 20.0000 mg | ORAL_TABLET | Freq: Every day | ORAL | 0 refills | Status: DC

## 2021-12-16 NOTE — Assessment & Plan Note (Signed)
Just over a week of symptoms, husband has the same and no fever with neg covid testing at home Reassuring exam Copd does not seem to have flared, but warrants close f/u  Low threshold for cxr if needed  Given px prednisone to take if wheeze or chest tightness occurs  Pt will see pulm tomorrow re: CT scan as planned ER precautions noted Update if not starting to improve in a week or if worsening  Has tessalon for symptoms Enc to try expectorant  Antihistamine prn

## 2021-12-16 NOTE — Patient Instructions (Addendum)
I think you have a viral upper respiratory infection   Take an expectorant like mucinex to help (or robitussin)  Drink fluids and rest when you can  Continue the tessalon pills for cough   Hold on to the prednisone for wheezing-if you get tight or wheezy start it and let us know   Update if not starting to improve in a week or if worsening

## 2021-12-16 NOTE — Progress Notes (Signed)
Subjective:    Patient ID: Diana Cook, female    DOB: 1951-02-16, 71 y.o.   MRN: [id number]  HPI Pt presents with sinus congestion and cough  Wt Readings from Last 3 Encounters:  12/16/21 189 lb 4 oz (85.8 kg)  11/25/21 190 lb 12.8 oz (86.5 kg)  09/23/21 185 lb 8 oz (84.1 kg)   28.78 kg/m  Covid test neg at home  Her husband has the same symptoms   Over a week  Sinus drainage Nasal congestion at times, mucous is a little yellow  Chest congestion - phlegm is a little yellow   Not really wheezing  A little burning feeling in chest  Hurts to cough at times /sharp  No new sob   Throat was a little sore /better this am  Ears are ok   No chills No fever  No change in taste or smell   Takes antihistamine- chlorpheniramine for pnd    Had report on last CT of chest  Watching an area  Has f/u with Dr Duwayne Heck- pulmonary    Has some tessalon from her ENT-taking those   Patient Active Problem List   Diagnosis Date Noted   Fall at home 11/25/2021   Right shoulder pain 11/25/2021   Mild anemia 11/03/2021   Fall 09/23/2021   Thyroid nodule 09/17/2021   Hyponatremia 09/17/2021   Sepsis due to gram-negative UTI ([place]) 09/05/2021   Dyslipidemia 09/05/2021   Depression 09/05/2021   GERD without esophagitis 09/05/2021   Vitamin D deficiency 08/20/2021   Colon cancer screening 07/30/2021   Hypercalcemia 07/29/2021   Diarrhea 07/29/2021   UTI (urinary tract infection) 07/17/2021   AKI (acute kidney injury) ([place]) 07/17/2021   Dysuria 07/08/2021   Arterial hypotension 07/08/2021   Paroxysmal atrial fibrillation ([place]) 04/11/2021   Current use of proton pump inhibitor 10/24/2020   Sleep apnea 10/24/2020   Fatigue 10/24/2020   Lightheadedness 05/16/2020   Radicular pain in left arm 03/02/2020   Neck pain on left side 02/15/2020   Paresthesia 02/15/2020   Chronic respiratory failure with hypoxia ([place]) 11/17/2019   Chronic heart failure with preserved ejection fraction  (HFpEF) ([place]) 08/11/2019   Coronary artery disease involving native coronary artery of native heart without angina pectoris 06/20/2019   Gastroesophageal reflux disease    Stomach irritation    Abnormal CT scan, esophagus    Polyp of ascending colon    Personal history of tobacco use, presenting hazards to health 01/18/2018   Grade I diastolic dysfunction 95/63/8756   Globus pharyngeus 12/21/2017   Chronic obstructive pulmonary disease ([place]) 12/15/2017   Hyperlipidemia LDL goal <70 12/07/2017   Seizures ([place]) 10/01/2016   Elevated glucose 08/31/2016   Grief reaction 04/11/2016   History of breast cancer 12/11/2015   Osteoporosis 11/22/2015   Estrogen deficiency 09/07/2015   Bruising 09/07/2015   Wrinkles 09/07/2015   Post herpetic neuralgia 09/01/2014   Encounter for Medicare annual wellness exam 07/11/2014   Routine general medical examination at a health care facility 06/26/2013   H/O small bowel obstruction 04/15/2013   Dyspnea on exertion 02/10/2013   Complete heart block ([place]) 11/03/2011   Pacemaker-[employer] 11/03/2011   Lumbar spinal stenosis 09/02/2011   POSTMENOPAUSAL STATUS 08/06/2009   HIDRADENITIS SUPPURATIVA 06/26/2008   HERNIATED DISC 12/14/2007   BACK, LOWER, PAIN 12/02/2007   Hyperkalemia 08/06/2007   HX, PERSONAL, COLONIC POLYPS 09/14/2006   Former smoker 06/10/2006   ASTHMA 06/10/2006   H/O idiopathic seizure 06/10/2006   Past Medical  History:  Diagnosis Date   Allergic rhinitis    Arthritis    Asthma    as a child, mild now   Breast cancer ([place]) 12/2015   right breast cancer, lumpectomy and mammosite    Colon polyps    colonoscopy 7/08, tubular adenoma   Complete heart block ([place]) 09/2011   s/p PPM implanted in [facility]   COPD (chronic obstructive pulmonary disease) ([place])    Myocardial infarction ([place]) 2011   Osteopenia 10/2015   Pacemaker    2011   Paroxysmal atrial fibrillation ([place]) 03/2021   Incidentally detected on pacemaker  interrogation   Personal history of radiation therapy 2017   right breast ca, mammosite placed   Seizure disorder ([place])    Seizures ([place])    first one was when she was 71 years old    Small bowel obstruction ([place])    1988 and 2002   Tobacco abuse    Past Surgical History:  Procedure Laterality Date   ABDOMINAL HYSTERECTOMY     BREAST BIOPSY Right 2007   benign inflammatory changes, mass due to underwire bra   BREAST BIOPSY Left 01/02/2016   columnar cell changes without atypical hyperplasia.   BREAST BIOPSY Right 12/06/2015   rt breast mass 10:00, bx done at Dr. Curly [place] office, invasive ductal carcinoma   BREAST EXCISIONAL BIOPSY Left 01/02/2016   COLUMNAR CELL CHANGE AND HYPERPLASIA ASSOCIATED WITH LUMINAL AND STROMAL CALCIFICATIONS   BREAST LUMPECTOMY Right 01/02/2016   invasive mammary carcinoma, clear margins, negative LN   BREAST LUMPECTOMY WITH SENTINEL LYMPH NODE BIOPSY Right 01/02/2016   pT1c, N0; ER/ PR 100%; Her 2 neu not over expressed: BREAST LUMPECTOMY WITH SENTINEL LYMPH NODE BX;  Surgeon: [name], MD;  Location: [facility] ORS;  Service: General;  Laterality: Right;   CARDIAC CATHETERIZATION     CATARACT EXTRACTION Bilateral    COLONOSCOPY  10/2015   Dr Ardis Hughs   COLONOSCOPY WITH PROPOFOL N/A 08/19/2018   Procedure: COLONOSCOPY WITH PROPOFOL;  Surgeon: Virgel Manifold, MD;  Location: [facility] ENDOSCOPY;  Service: Endoscopy;  Laterality: N/A;   ESOPHAGOGASTRODUODENOSCOPY (EGD) WITH PROPOFOL N/A 08/19/2018   Procedure: ESOPHAGOGASTRODUODENOSCOPY (EGD) WITH PROPOFOL;  Surgeon: Virgel Manifold, MD;  Location: [facility] ENDOSCOPY;  Service: Endoscopy;  Laterality: N/A;   EXPLORATORY LAPAROTOMY  01/25/2001   Exploratory laparotomy, lysis of adhesions, identification of internal hernia secondary to omental adhesion. Prolonged postoperative ileus.   gyn surgery  1993   hysterectomy- form endometriosis   LAPAROSCOPY     PACEMAKER INSERTION  10/24/11   [employer]  Advantio dual chamber PPM implanted by Dr Bunnie Philips at [facility] in [place] N/A 12/05/2019   Procedure: [place];  Surgeon: Deboraha Sprang, MD;  Location: [place] [facility];  Service: Cardiovascular;  Laterality: N/A;   RIGHT/LEFT HEART CATH AND CORONARY ANGIOGRAPHY Bilateral 07/19/2019   Procedure: RIGHT/LEFT HEART CATH AND CORONARY ANGIOGRAPHY;  Surgeon: Nelva Bush, MD;  Location: [place] [facility];  Service: Cardiovascular;  Laterality: Bilateral;   TEMPORARY PACEMAKER N/A 12/05/2019   Procedure: TEMPORARY PACEMAKER;  Surgeon: Deboraha Sprang, MD;  Location: [place] [facility];  Service: Cardiovascular;  Laterality: N/A;   Social History   Tobacco Use   Smoking status: Former    Packs/day: 1.50    Years: 42.00    Total pack years: 63.00    Types: Cigarettes    Quit date: 11/11/2010    Years since  quitting: 11.1   Smokeless tobacco: Never  Vaping Use   Vaping Use: Never used  Substance Use Topics   Alcohol use: Yes    Alcohol/week: 2.0 standard drinks of alcohol    Types: 2 Glasses of wine per week    Comment: weekly   Drug use: No   Family History  Problem Relation Age of Onset   Stroke Mother    Heart disease Mother 74       MI and CABG   Dementia Mother    Coronary artery disease Father    Parkinsonism Father    Cancer - Cervical Daughter 49       died 03/14/2022   Heart attack Brother 40   Colon cancer Neg Hx    Breast cancer Neg Hx    Allergies  Allergen Reactions   Codeine Nausea And Vomiting   Morphine And Related Nausea Only   Current Outpatient Medications on File Prior to Visit  Medication Sig Dispense Refill   acetaminophen (TYLENOL) 500 MG tablet Take 500-1,000 mg by mouth every 6 (six) hours as needed for mild pain or headache.      albuterol (VENTOLIN HFA) 108 (90 Base) MCG/ACT inhaler Inhale 2 puffs into the lungs every 4 (four) hours as needed for wheezing or shortness of breath. 18 g  1   apixaban (ELIQUIS) 5 MG TABS tablet Take 1 tablet by mouth twice daily 180 tablet 1   atorvastatin (LIPITOR) 40 MG tablet Take 1 tablet by mouth once daily 90 tablet 2   benzonatate (TESSALON) 100 MG capsule Take by mouth as needed.     feeding supplement (ENSURE ENLIVE / ENSURE PLUS) LIQD Take 237 mLs by mouth 3 (three) times daily between meals. 237 mL 12   FLUoxetine (PROZAC) 20 MG capsule Take 1 capsule by mouth once daily 90 capsule 3   furosemide (LASIX) 40 MG tablet Take 1 tablet (40 mg total) by mouth daily as needed (swelling, wt gain or needing more oxygen). 90 tablet 0   levETIRAcetam (KEPPRA) 250 MG tablet Take 250 mg by mouth 2 (two) times daily.      loratadine (CLARITIN) 10 MG tablet Take 1 tablet (10 mg total) by mouth daily.     Multiple Vitamin (MULTIVITAMIN WITH MINERALS) TABS tablet Take 1 tablet by mouth daily.     omeprazole (PRILOSEC) 40 MG capsule Take 40 mg by mouth daily.     OXYGEN Inhale 3 L into the lungs at bedtime.      STIOLTO RESPIMAT 2.5-2.5 MCG/ACT AERS INHALE 2 PUFFS BY MOUTH ONCE DAILY 4 g 0   tretinoin (RETIN-A) 0.1 % cream Apply topically at bedtime. 45 g 1   VITAMIN D PO Take 1 tablet by mouth daily.     zolpidem (AMBIEN) 10 MG tablet TAKE 1/2 TO 1 (ONE-HALF TO ONE) TABLET BY MOUTH AT BEDTIME AS NEEDED FOR SLEEP 30 tablet 3   No current facility-administered medications on file prior to visit.    Review of Systems  Constitutional:  Positive for appetite change and fatigue. Negative for fever.  HENT:  Positive for congestion, postnasal drip, rhinorrhea, sinus pressure, sneezing and sore throat. Negative for ear pain.   Eyes:  Negative for pain and discharge.  Respiratory:  Positive for cough. Negative for shortness of breath, wheezing and stridor.   Cardiovascular:  Negative for chest pain.  Gastrointestinal:  Negative for diarrhea, nausea and vomiting.  Genitourinary:  Negative for frequency, hematuria and urgency.  Musculoskeletal:  Negative  for  arthralgias and myalgias.  Skin:  Negative for rash.  Neurological:  Positive for headaches. Negative for dizziness, weakness and light-headedness.  Psychiatric/Behavioral:  Negative for confusion and dysphoric mood.        Objective:   Physical Exam Constitutional:      General: She is not in acute distress.    Appearance: Normal appearance. She is well-developed and normal weight. She is not ill-appearing, toxic-appearing or diaphoretic.  HENT:     Head: Normocephalic and atraumatic.     Comments: Nares are injected and congested      Right Ear: Tympanic membrane, ear canal and external ear normal.     Left Ear: Tympanic membrane, ear canal and external ear normal.     Nose: Congestion and rhinorrhea present.     Mouth/Throat:     Mouth: Mucous membranes are moist.     Pharynx: Oropharynx is clear. No oropharyngeal exudate or posterior oropharyngeal erythema.     Comments: Clear pnd  Eyes:     General:        Right eye: No discharge.        Left eye: No discharge.     Conjunctiva/sclera: Conjunctivae normal.     Pupils: Pupils are equal, round, and reactive to light.  Cardiovascular:     Rate and Rhythm: Normal rate.     Heart sounds: Normal heart sounds.  Pulmonary:     Effort: Pulmonary effort is normal. No respiratory distress.     Breath sounds: Normal breath sounds. No stridor. No wheezing, rhonchi or rales.     Comments: Diffusely distant bs, baseline  No wheezing or stridor today  No rhonchi   Chest:     Chest wall: No tenderness.  Musculoskeletal:     Cervical back: Normal range of motion and neck supple.  Lymphadenopathy:     Cervical: No cervical adenopathy.  Skin:    General: Skin is warm and dry.     Capillary Refill: Capillary refill takes less than 2 seconds.     Coloration: Skin is not pale.     Findings: No rash.  Neurological:     Mental Status: She is alert.     Cranial Nerves: No cranial nerve deficit.  Psychiatric:        Mood and Affect:  Mood normal.           Assessment & Plan:   Problem List Items Addressed This Visit       Respiratory   Viral URI with cough    Just over a week of symptoms, husband has the same and no fever with neg covid testing at home Reassuring exam Copd does not seem to have flared, but warrants close f/u  Low threshold for cxr if needed  Given px prednisone to take if wheeze or chest tightness occurs  Pt will see pulm tomorrow re: CT scan as planned ER precautions noted Update if not starting to improve in a week or if worsening  Has tessalon for symptoms Enc to try expectorant  Antihistamine prn

## 2021-12-17 ENCOUNTER — Ambulatory Visit (INDEPENDENT_AMBULATORY_CARE_PROVIDER_SITE_OTHER): Payer: Medicare Other | Admitting: Pulmonary Disease

## 2021-12-17 ENCOUNTER — Encounter: Payer: Self-pay | Admitting: Pulmonary Disease

## 2021-12-17 VITALS — BP 120/78 | HR 81 | Temp 97.8°F | Ht 68.0 in | Wt 190.0 lb

## 2021-12-17 DIAGNOSIS — J440 Chronic obstructive pulmonary disease with acute lower respiratory infection: Secondary | ICD-10-CM | POA: Diagnosis not present

## 2021-12-17 DIAGNOSIS — J9611 Chronic respiratory failure with hypoxia: Secondary | ICD-10-CM | POA: Diagnosis not present

## 2021-12-17 DIAGNOSIS — R911 Solitary pulmonary nodule: Secondary | ICD-10-CM | POA: Diagnosis not present

## 2021-12-17 DIAGNOSIS — J432 Centrilobular emphysema: Secondary | ICD-10-CM | POA: Diagnosis not present

## 2021-12-17 DIAGNOSIS — J209 Acute bronchitis, unspecified: Secondary | ICD-10-CM

## 2021-12-17 MED ORDER — AZITHROMYCIN 250 MG PO TABS: ORAL_TABLET | ORAL | 0 refills | Status: AC

## 2021-12-17 NOTE — Progress Notes (Signed)
Subjective:    Patient ID: Diana Cook, female    DOB: 1950-09-23, 71 y.o.   MRN: [id number] Patient Care Team: Tower, Audrie Gallus, MD as PCP - General (Family Medicine) End, Cristal Deer, MD as PCP - Cardiology (Cardiology) Duke Salvia, MD as Consulting Physician (Cardiology) Irene Limbo., MD as Referring Physician (Ophthalmology) Tower, Audrie Gallus, MD as Consulting Physician (Family Medicine) Lemar Livings Merrily Pew, MD (General Surgery)  Chief Complaint  Patient presents with   Follow-up    Chest CT on 12/10/2021. No SOB or wheezing. Cough with yellow sputum.    HPI The patient is a 71 year old former smoker (quit 2012, 42 PVY) who follows here for the issue of dyspnea on exertion in the setting of emphysema with preserved FEV1.  She also has chronic respiratory failure with hypoxia related to emphysema. This is a scheduled follow-up visit.  Last seen on 06 June 2021, at that time she had completed pulmonary rehab had noted marked improvement on her dyspnea symptoms.  She is compliant with oxygen with activity and sleep.  She continues to do well with this. She has a prior history of complete heart block and is status post pacemaker placement had pacer revision in October 2021 by Dr. Graciela Husbands.  Previously she was noted to have some atrial arrhythmias and PAF and has been now started on Eliquis.  She is compliant with Stiolto and as needed albuterol.  Has not had any fevers, chills or sweats.  No calf tenderness or lower extremity edema.  No sleep disturbance.  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  She is not on inhaled corticosteroids.  She is being managed with PPI for gastroesophageal reflux.  As noted her dyspnea on exertion is better since she completed pulmonary rehab.  Over the last 2 to 3 days she has noted some change  in the character of her sputum and it is yellowish to greenish.  No hemoptysis.  She does not endorse any other symptomatology today.  She has had lung nodules that have been followed up with serial CT chest.  Her last CT was on 10 December 2021 this did show that she has an enlarging right upper lobe nodule which is concerning for primary bronchogenic carcinoma.  Patient is very high risk for biopsy due to her chronic respiratory failure with hypoxia and significant emphysema.  We discussed obtaining PET/CT and empiric SBRT versus biopsy.  The patient would like to opt for the least invasive pathway.   Review of Systems A 10 point review of systems was performed and it is as noted above otherwise negative.  Patient Active Problem List   Diagnosis Date Noted   Fall at home 11/25/2021   Right shoulder pain 11/25/2021   Mild anemia 11/03/2021   Fall 09/23/2021   Thyroid nodule 09/17/2021   Hyponatremia 09/17/2021   Sepsis due to gram-negative UTI (HCC) 09/05/2021   Dyslipidemia 09/05/2021   Depression 09/05/2021   GERD without esophagitis 09/05/2021   Vitamin D deficiency 08/20/2021   Colon cancer screening 07/30/2021   Hypercalcemia 07/29/2021   Diarrhea 07/29/2021   UTI (urinary tract infection) 07/17/2021   AKI (acute kidney injury) (HCC) 07/17/2021   Dysuria 07/08/2021   Arterial hypotension 07/08/2021   Paroxysmal atrial fibrillation (HCC) 04/11/2021   Current use of proton pump  inhibitor 10/24/2020   Sleep apnea 10/24/2020   Fatigue 10/24/2020   Lightheadedness 05/16/2020   Radicular pain in left arm 03/02/2020   Neck pain on left side 02/15/2020   Paresthesia 02/15/2020   Chronic respiratory failure with hypoxia (HCC) 11/17/2019   Chronic heart failure with preserved ejection fraction (HFpEF) (HCC) 08/11/2019   Coronary artery disease involving native coronary artery of native heart without angina pectoris 06/20/2019   Gastroesophageal reflux disease    Stomach irritation     Abnormal CT scan, esophagus    Polyp of ascending colon    Personal history of tobacco use, presenting hazards to health 01/18/2018   Grade I diastolic dysfunction 12/21/2017   Globus pharyngeus 12/21/2017   Chronic obstructive pulmonary disease (HCC) 12/15/2017   Hyperlipidemia LDL goal <70 12/07/2017   Seizures (HCC) 10/01/2016   Elevated glucose 08/31/2016   Grief reaction 04/11/2016   History of breast cancer 12/11/2015   Osteoporosis 11/22/2015   Estrogen deficiency 09/07/2015   Bruising 09/07/2015   Wrinkles 09/07/2015   Viral URI with cough 05/22/2015   Post herpetic neuralgia 09/01/2014   Encounter for Medicare annual wellness exam 07/11/2014   Routine general medical examination at a health care facility 06/26/2013   H/O small bowel obstruction 04/15/2013   Dyspnea on exertion 02/10/2013   Complete heart block (HCC) 11/03/2011   Pacemaker-[employer] 11/03/2011   Lumbar spinal stenosis 09/02/2011   POSTMENOPAUSAL STATUS 08/06/2009   HIDRADENITIS SUPPURATIVA 06/26/2008   HERNIATED DISC 12/14/2007   BACK, LOWER, PAIN 12/02/2007   Hyperkalemia 08/06/2007   HX, PERSONAL, COLONIC POLYPS 09/14/2006   Former smoker 06/10/2006   ASTHMA 06/10/2006   H/O idiopathic seizure 06/10/2006   Social History   Tobacco Use   Smoking status: Former    Packs/day: 1.50    Years: 42.00    Total pack years: 63.00    Types: Cigarettes    Quit date: 11/11/2010    Years since quitting: 11.1   Smokeless tobacco: Never  Substance Use Topics   Alcohol use: Yes    Alcohol/week: 2.0 standard drinks of alcohol    Types: 2 Glasses of wine per week    Comment: weekly   Allergies  Allergen Reactions   Codeine Nausea And Vomiting   Morphine And Related Nausea Only   Current Meds  Medication Sig   acetaminophen (TYLENOL) 500 MG tablet Take 500-1,000 mg by mouth every 6 (six) hours as needed for mild pain or headache.    albuterol (VENTOLIN HFA) 108 (90 Base) MCG/ACT inhaler  Inhale 2 puffs into the lungs every 4 (four) hours as needed for wheezing or shortness of breath.   apixaban (ELIQUIS) 5 MG TABS tablet Take 1 tablet by mouth twice daily   atorvastatin (LIPITOR) 40 MG tablet Take 1 tablet by mouth once daily   azithromycin (ZITHROMAX) 250 MG tablet Take 2 tablets (500 mg) on  Day 1,  followed by 1 tablet (250 mg) once daily on Days 2 through 5.   benzonatate (TESSALON) 100 MG capsule Take by mouth as needed.   feeding supplement (ENSURE ENLIVE / ENSURE PLUS) LIQD Take 237 mLs by mouth 3 (three) times daily between meals.   FLUoxetine (PROZAC) 20 MG capsule Take 1 capsule by mouth once daily   furosemide (LASIX) 40 MG tablet Take 1 tablet (40 mg total) by mouth daily as needed (swelling, wt gain or needing more oxygen).   levETIRAcetam (KEPPRA) 250 MG tablet Take 250 mg by mouth 2 (two) times daily.  Multiple Vitamin (MULTIVITAMIN WITH MINERALS) TABS tablet Take 1 tablet by mouth daily.   omeprazole (PRILOSEC) 40 MG capsule Take 40 mg by mouth daily.   OXYGEN Inhale 3 L into the lungs at bedtime.    predniSONE (DELTASONE) 20 MG tablet Take 1 tablet (20 mg total) by mouth daily with breakfast.   STIOLTO RESPIMAT 2.5-2.5 MCG/ACT AERS INHALE 2 PUFFS BY MOUTH ONCE DAILY   VITAMIN D PO Take 1 tablet by mouth daily.   zolpidem (AMBIEN) 10 MG tablet TAKE 1/2 TO 1 (ONE-HALF TO ONE) TABLET BY MOUTH AT BEDTIME AS NEEDED FOR SLEEP   Immunization History  Administered Date(s) Administered   Fluad Quad(high Dose 65+) 10/24/2020   Influenza Whole 02/24/2005   Influenza, High Dose Seasonal PF 11/04/2018, 11/22/2019   Influenza,inj,Quad PF,6+ Mos 12/03/2012, 12/09/2013, 01/16/2015, 02/06/2016, 11/04/2016, 12/08/2017   PFIZER Comirnaty(Gray Top)Covid-19 Tri-Sucrose Vaccine 07/06/2020   PFIZER(Purple Top)SARS-COV-2 Vaccination 02/28/2019, 03/21/2019, 11/14/2019   Pneumococcal Conjugate-13 07/11/2014   Pneumococcal Polysaccharide-23 03/21/2005, 07/26/2015   Td 01/09/1999,  06/14/2007   Zoster, Live 09/07/2015        Objective:   Physical Exam BP 120/78 (BP Location: Left Arm, Cuff Size: Normal)   Pulse 81   Temp 97.8 F (36.6 C)   Ht 5\' 8"  (1.727 m)   Wt 190 lb (86.2 kg)   SpO2 90%   BMI 28.89 kg/m   SpO2: 90 % O2 Device: Nasal cannula O2 Flow Rate (L/min): 4 L/min  GENERAL: Well-developed well-nourished woman in no acute respiratory distress.  She is fully ambulatory.  No conversational dyspnea.  Does sound hoarse. HEAD: Normocephalic, atraumatic. EYES: Pupils equal, round, reactive to light.  No scleral icterus. MOUTH: Nose/mouth/throat not examined due to masking requirements for COVID 19. NECK: Supple. No thyromegaly. No nodules. No JVD.  Trachea midline. PULMONARY: Symmetrical air entry, clear with no adventitious sounds CARDIOVASCULAR: S1 and S2. Regular rate and rhythm. No rubs murmurs gallops heard. GASTROINTESTINAL: Nondistended. MUSCULOSKELETAL: No joint deformity, no clubbing, no edema. NEUROLOGIC: Awake, alert, oriented x4, no focal deficits. SKIN: Intact,warm,dry, on limited exam no rashes. PSYCH: Normal mood and behavior.   Representative image of CT performed 10 December 2021 showing enlarging right upper lobe nodule (arrow)     Assessment & Plan:     ICD-10-CM   1. Lung nodule seen on imaging study  R91.1 NM PET Image Restage (PS) Skull Base to Thigh (F-18 FDG)    Ambulatory referral to Radiation Oncology    NM PET Image Initial (PI) Skull Base To Thigh (F-18 FDG)   Enlarging on most recent CT PET/CT Referral to radiation oncology Consider empiric SBRT    2. Acute bronchitis with COPD (HCC)  J44.0    J20.9    Azithromycin 5-day course    3. Centrilobular emphysema (HCC)  J43.2    Continue Stiolto 2 puffs daily Continue as needed albuterol    4. Chronic respiratory failure with hypoxia (HCC)  J96.11    Patient compliant with oxygen supplementation Continue oxygen at 4 L/min 24/7     Orders Placed This  Encounter  Procedures   NM PET Image Restage (PS) Skull Base to Thigh (F-18 FDG)    Standing Status:   Future    Number of Occurrences:   1    Standing Expiration Date:   12/18/2022    Order Specific Question:   If indicated for the ordered procedure, I authorize the administration of a radiopharmaceutical per Radiology protocol    Answer:   Yes  Order Specific Question:   Preferred imaging location?    Answer:   [facility]   Ambulatory referral to Radiation Oncology    Referral Priority:   Routine    Referral Type:   Consultation    Referral Reason:   Specialty Services Required    Requested Specialty:   Radiation Oncology    Number of Visits Requested:   1   Meds ordered this encounter  Medications   azithromycin (ZITHROMAX) 250 MG tablet    Sig: Take 2 tablets (500 mg) on  Day 1,  followed by 1 tablet (250 mg) once daily on Days 2 through 5.    Dispense:  6 each    Refill:  0    Will see the patient in follow-up in 6 to 8 weeks time she is to call sooner should any new problems arise.   Gailen Shelter, MD Advanced Bronchoscopy PCCM [facility]    *This note was dictated using voice recognition software/Dragon.  Despite best efforts to proofread, errors can occur which can change the meaning. Any transcriptional errors that result from this process are unintentional and may not be fully corrected at the time of dictation.

## 2021-12-17 NOTE — Patient Instructions (Addendum)
We are getting a PET/CT.  I have sent in prescriptions for Azithromycin.  Make sure you take the prednisone as well.  We will see you in follow-up in 6 to 8 weeks time call sooner should any new problems arise.  We will send the referrals to Dr. Baruch Gouty.

## 2021-12-18 ENCOUNTER — Telehealth: Payer: Self-pay | Admitting: *Deleted

## 2021-12-18 NOTE — Telephone Encounter (Signed)
error 

## 2021-12-18 NOTE — Telephone Encounter (Signed)
Called patient to notify her of scheduled appointment with Dr. Baruch Gouty on 12/24/21 at 27.

## 2021-12-20 ENCOUNTER — Encounter: Payer: Self-pay | Admitting: Emergency Medicine

## 2021-12-20 ENCOUNTER — Telehealth: Payer: Self-pay | Admitting: Pulmonary Disease

## 2021-12-20 ENCOUNTER — Other Ambulatory Visit: Payer: Self-pay

## 2021-12-20 ENCOUNTER — Emergency Department
Admission: EM | Admit: 2021-12-20 | Discharge: 2021-12-20 | Disposition: A | Discharge status: Home / Self Care | Payer: Medicare Other | Attending: Student in an Organized Health Care Education/Training Program | Admitting: Student in an Organized Health Care Education/Training Program

## 2021-12-20 ENCOUNTER — Emergency Department: Payer: Medicare Other

## 2021-12-20 DIAGNOSIS — J449 Chronic obstructive pulmonary disease, unspecified: Secondary | ICD-10-CM | POA: Diagnosis not present

## 2021-12-20 DIAGNOSIS — Z1152 Encounter for screening for COVID-19: Secondary | ICD-10-CM | POA: Diagnosis not present

## 2021-12-20 DIAGNOSIS — R0602 Shortness of breath: Secondary | ICD-10-CM | POA: Diagnosis not present

## 2021-12-20 DIAGNOSIS — Z7901 Long term (current) use of anticoagulants: Secondary | ICD-10-CM | POA: Diagnosis not present

## 2021-12-20 DIAGNOSIS — R051 Acute cough: Secondary | ICD-10-CM | POA: Diagnosis not present

## 2021-12-20 DIAGNOSIS — Z9981 Dependence on supplemental oxygen: Secondary | ICD-10-CM | POA: Insufficient documentation

## 2021-12-20 DIAGNOSIS — B999 Unspecified infectious disease: Secondary | ICD-10-CM | POA: Diagnosis not present

## 2021-12-20 DIAGNOSIS — R911 Solitary pulmonary nodule: Secondary | ICD-10-CM | POA: Diagnosis not present

## 2021-12-20 DIAGNOSIS — R059 Cough, unspecified: Secondary | ICD-10-CM | POA: Diagnosis present

## 2021-12-20 LAB — CBC WITH DIFFERENTIAL/PLATELET
Abs Immature Granulocytes: 0.02 10*3/uL (ref 0.00–0.07)
Basophils Absolute: 0 10*3/uL (ref 0.0–0.1)
Basophils Relative: 0 %
Eosinophils Absolute: 0.1 10*3/uL (ref 0.0–0.5)
Eosinophils Relative: 1 %
HCT: 35.4 % — ABNORMAL LOW (ref 36.0–46.0)
Hemoglobin: 11.5 g/dL — ABNORMAL LOW (ref 12.0–15.0)
Immature Granulocytes: 0 %
Lymphocytes Relative: 14 %
Lymphs Abs: 1 10*3/uL (ref 0.7–4.0)
MCH: 29.3 pg (ref 26.0–34.0)
MCHC: 32.5 g/dL (ref 30.0–36.0)
MCV: 90.3 fL (ref 80.0–100.0)
Monocytes Absolute: 0.7 10*3/uL (ref 0.1–1.0)
Monocytes Relative: 10 %
Neutro Abs: 5.5 10*3/uL (ref 1.7–7.7)
Neutrophils Relative %: 75 %
Platelets: 300 10*3/uL (ref 150–400)
RBC: 3.92 MIL/uL (ref 3.87–5.11)
RDW: 13.2 % (ref 11.5–15.5)
WBC: 7.3 10*3/uL (ref 4.0–10.5)
nRBC: 0 % (ref 0.0–0.2)

## 2021-12-20 LAB — BASIC METABOLIC PANEL
Anion gap: 9 (ref 5–15)
BUN: 18 mg/dL (ref 8–23)
CO2: 21 mmol/L — ABNORMAL LOW (ref 22–32)
Calcium: 9.6 mg/dL (ref 8.9–10.3)
Chloride: 103 mmol/L (ref 98–111)
Creatinine, Ser: 0.94 mg/dL (ref 0.44–1.00)
GFR, Estimated: >60 mL/min (ref >60)
Glucose, Bld: 130 mg/dL — ABNORMAL HIGH (ref 70–99)
Potassium: 4.4 mmol/L (ref 3.5–5.1)
Sodium: 133 mmol/L — ABNORMAL LOW (ref 135–145)

## 2021-12-20 LAB — RESP PANEL BY RT-PCR (FLU A&B, COVID) ARPGX2
Influenza A by PCR: NEGATIVE
Influenza B by PCR: NEGATIVE
SARS Coronavirus 2 by RT PCR: NEGATIVE

## 2021-12-20 MED ORDER — METHYLPREDNISOLONE 4 MG PO TBPK: ORAL_TABLET | ORAL | 0 refills | Status: DC

## 2021-12-20 NOTE — Telephone Encounter (Signed)
Tom husband states patient's Covid 19 test was negative. Tom phone number is [phone].

## 2021-12-20 NOTE — Telephone Encounter (Signed)
Called and spoke to patient's spouse, Tom(DPR). She will complete course of azithromycin tomorrow. No Improvement in sx.  C/o SOB with exertion and at rest, prod cough with white to yellow mucus, upper right side chest discomfort and middle back discomfort x1w. No recent covid test. Will test and call back back if positive  Denied f/c/s or additional sx. On 4L cont. Not monitoring oxygen levels.  Albuterol HFA daily and Stiolto as needed.   Dr. Patsey Berthold, please advise. Thanks

## 2021-12-20 NOTE — Telephone Encounter (Signed)
Patient's spouse, Tom(DPR) is aware of recommendations and voiced his understanding.  Patient will present to ED. Nothing further needed.

## 2021-12-20 NOTE — Telephone Encounter (Signed)
She needs to take the Stiolto daily and albuterol as needed.  However given that her symptoms have worsened and no longer upper respiratory, she needs to be seen in the ED.

## 2021-12-20 NOTE — ED Triage Notes (Signed)
Pt arrives with c/o cough and middle back pain. Per pt, she has had a cough and congestion. Pt has COPD and wears 4L of O2 at home. Pt endorses productive cough and hoarseness.

## 2021-12-20 NOTE — ED Provider Notes (Signed)
[facility] Provider Note    Event Date/Time   First MD Initiated Contact with Patient 12/20/21 1358     (approximate)   History   Cough   HPI  Diana Cook is a 71 y.o. female with a history of COPD with chronic hypoxic respiratory failure on 4 L nasal cannula presents to the ER for evaluation of persistent cough despite course of steroids as well as antibiotics.  Says she feels well is not having significant shortness of breath but still having cough with sent to the ER for evaluation.  She denies any pain or congestion.  Denies any lower extremity swelling.  She is on Eliquis and has been compliant with his medications.     Physical Exam   Triage Vital Signs: ED Triage Vitals  Enc Vitals Group     BP 12/20/21 1248 138/74     Pulse Rate 12/20/21 1248 71     Resp 12/20/21 1248 20     Temp 12/20/21 1248 99.5 F (37.5 C)     Temp Source 12/20/21 1248 Oral     SpO2 12/20/21 1248 92 %     Weight 12/20/21 1249 187 lb 6.3 oz (85 kg)     Height --      Head Circumference --      Peak Flow --      Pain Score 12/20/21 1248 7     Pain Loc --      Pain Edu? --      Excl. in [place]? --     Most recent vital signs: Vitals:   12/20/21 1248  BP: 138/74  Pulse: 71  Resp: 20  Temp: 99.5 F (37.5 C)  SpO2: 92%     Constitutional: Alert  Eyes: Conjunctivae are normal.  Head: Atraumatic. Nose: No congestion/rhinnorhea. Mouth/Throat: Mucous membranes are moist.   Neck: Painless ROM.  Cardiovascular:   Good peripheral circulation. Respiratory: Normal respiratory effort.  No retractions.  Gastrointestinal: Soft and nontender.  Musculoskeletal:  no deformity Neurologic:  MAE spontaneously. No gross focal neurologic deficits are appreciated.  Skin:  Skin is warm, dry and intact. No rash noted. Psychiatric: Mood and affect are normal. Speech and behavior are normal.    ED Results / Procedures / Treatments   Labs (all labs ordered are listed, but  only abnormal results are displayed) Labs Reviewed  CBC WITH DIFFERENTIAL/PLATELET - Abnormal; Notable for the following components:      Result Value   Hemoglobin 11.5 (*)    HCT 35.4 (*)    All other components within normal limits  BASIC METABOLIC PANEL - Abnormal; Notable for the following components:   Sodium 133 (*)    CO2 21 (*)    Glucose, Bld 130 (*)    All other components within normal limits  RESP PANEL BY RT-PCR (FLU A&B, COVID) [id number]     EKG ED ECG REPORT I, Merlyn Lot, the attending physician, personally viewed and interpreted this ECG.   Date: 12/20/2021  EKG Time: 13:05  Rate: 75  Rhythm: paced  Axis: left  Intervals: paced rhythm  ST&T Change:nonspecific abnl.  paced     RADIOLOGY Please see ED Course for my review and interpretation.  I personally reviewed all radiographic images ordered to evaluate for the above acute complaints and reviewed radiology reports and findings.  These findings were personally discussed with the patient.  Please see medical record for radiology report.    PROCEDURES:  Critical Care performed:  Procedures   MEDICATIONS ORDERED IN ED: Medications - No data to display   IMPRESSION / MDM / [place] / ED COURSE  I reviewed the triage vital signs and the nursing notes.                              Differential diagnosis includes, but is not limited to, Asthma, copd, CHF, pna, ptx, malignancy, Pe, anemia  Patient presenting to the ER for evaluation of symptoms as described above.  Based on symptoms, risk factors and considered above differential, this presenting complaint could reflect a potentially life-threatening illness therefore the patient will be placed on continuous pulse oximetry and telemetry for monitoring.  Laboratory evaluation will be sent to evaluate for the above complaints.  X-ray ordered for the but differential on my review and interpretation does not show any evidence of infiltrate  or consolidation.  She does not have any significant wheezing on exam she is on Eliquis and is compliant with this medication does not seem consistent with PE.  Viral panel sent as the patient may have flu but she is not hypoxic states that she feels comfortable with discharge home.  Will be placed on Medrol dose pack and taper after discussion with the patient's pulmonologist.  At this point do believe she stable and appropriate for outpatient follow-up.       FINAL CLINICAL IMPRESSION(S) / ED DIAGNOSES   Final diagnoses:  Acute cough     Rx / DC Orders   ED Discharge Orders          Ordered    methylPREDNISolone (MEDROL DOSEPAK) 4 MG TBPK tablet        12/20/21 1459             Note:  This document was prepared using Dragon voice recognition software and may include unintentional dictation errors.    Merlyn Lot, MD 12/20/21 1504

## 2021-12-20 NOTE — Telephone Encounter (Signed)
Spoke to [place]. He stated that covid test was negative.  Patient is headed to ED now.

## 2021-12-22 ENCOUNTER — Telehealth: Payer: Self-pay | Admitting: Emergency Medicine

## 2021-12-22 NOTE — Telephone Encounter (Signed)
I have directed them back to the ED. Patient's husband voiced understanding.

## 2021-12-22 NOTE — Telephone Encounter (Signed)
Patient called - I spoke w her spouse Gwyndolyn Saxon because she was unable to speak on the phone. She has had refractory cough, leading to dyspnea. No URI sx, fever etc. She was seen on 10/27 in the ED, started on a medrol dosepack which she is still taking. Given the dyspnea and hypoxemia I believe she needs to be seen now. I have directed them back to the

## 2021-12-23 ENCOUNTER — Telehealth: Payer: Self-pay

## 2021-12-23 NOTE — Telephone Encounter (Signed)
noted 

## 2021-12-23 NOTE — Telephone Encounter (Signed)
Please see 12/22/2021 phone note.  Spoke to patient for update, as she did not present to ED as recommended.  She stated that sx are baseline. C/o SOB with exertion, prod cough with yellow sputum, back discomfort and wheezing. She feels that back discomfort is related to coughing.  Denied f/c/s or additional.  She has not been monitoring spo2. She wears 4L cont.  She decided to hold of on going to ED and cont with medrol as prescribed 12/20/2021. She will complete medrol course 12/26/2021.  Routing to Dr. Patsey Berthold to make aware.

## 2021-12-24 ENCOUNTER — Encounter: Payer: Self-pay | Admitting: Radiation Oncology

## 2021-12-24 ENCOUNTER — Ambulatory Visit
Admission: RE | Admit: 2021-12-24 | Discharge: 2021-12-24 | Disposition: A | Discharge status: Home / Self Care | Payer: Medicare Other | Source: Ambulatory Visit | Attending: Radiation Oncology | Admitting: Radiation Oncology

## 2021-12-24 VITALS — BP 117/66 | HR 70 | Temp 98.0°F | Resp 20 | Ht 68.0 in | Wt 192.3 lb

## 2021-12-24 DIAGNOSIS — Z853 Personal history of malignant neoplasm of breast: Secondary | ICD-10-CM | POA: Insufficient documentation

## 2021-12-24 DIAGNOSIS — M8580 Other specified disorders of bone density and structure, unspecified site: Secondary | ICD-10-CM | POA: Insufficient documentation

## 2021-12-24 DIAGNOSIS — I252 Old myocardial infarction: Secondary | ICD-10-CM | POA: Diagnosis not present

## 2021-12-24 DIAGNOSIS — R918 Other nonspecific abnormal finding of lung field: Secondary | ICD-10-CM | POA: Diagnosis not present

## 2021-12-24 DIAGNOSIS — I480 Paroxysmal atrial fibrillation: Secondary | ICD-10-CM | POA: Diagnosis not present

## 2021-12-24 DIAGNOSIS — Z950 Presence of cardiac pacemaker: Secondary | ICD-10-CM | POA: Diagnosis not present

## 2021-12-24 DIAGNOSIS — Z809 Family history of malignant neoplasm, unspecified: Secondary | ICD-10-CM | POA: Diagnosis not present

## 2021-12-24 DIAGNOSIS — J4489 Other specified chronic obstructive pulmonary disease: Secondary | ICD-10-CM | POA: Insufficient documentation

## 2021-12-24 DIAGNOSIS — J449 Chronic obstructive pulmonary disease, unspecified: Secondary | ICD-10-CM | POA: Insufficient documentation

## 2021-12-24 DIAGNOSIS — R911 Solitary pulmonary nodule: Secondary | ICD-10-CM

## 2021-12-24 DIAGNOSIS — C3411 Malignant neoplasm of upper lobe, right bronchus or lung: Secondary | ICD-10-CM | POA: Diagnosis not present

## 2021-12-24 DIAGNOSIS — Z87891 Personal history of nicotine dependence: Secondary | ICD-10-CM | POA: Insufficient documentation

## 2021-12-24 DIAGNOSIS — C3432 Malignant neoplasm of lower lobe, left bronchus or lung: Secondary | ICD-10-CM | POA: Diagnosis not present

## 2021-12-24 DIAGNOSIS — Z86010 Personal history of colonic polyps: Secondary | ICD-10-CM | POA: Insufficient documentation

## 2021-12-24 DIAGNOSIS — Z7952 Long term (current) use of systemic steroids: Secondary | ICD-10-CM | POA: Insufficient documentation

## 2021-12-24 DIAGNOSIS — Z7901 Long term (current) use of anticoagulants: Secondary | ICD-10-CM | POA: Insufficient documentation

## 2021-12-24 DIAGNOSIS — Z79899 Other long term (current) drug therapy: Secondary | ICD-10-CM | POA: Insufficient documentation

## 2021-12-24 DIAGNOSIS — Z923 Personal history of irradiation: Secondary | ICD-10-CM | POA: Insufficient documentation

## 2021-12-24 NOTE — Consult Note (Signed)
NEW PATIENT EVALUATION  Name: Diana Cook  MRN: [id number]  Date:   12/24/2021     DOB: 1950-11-15   This 71 y.o. female patient presents to the clinic for initial evaluation of probable stage I non-small cell lung cancer of the right lung and patient previously treated to her right breast over 5 years prior.  REFERRING PHYSICIAN: Tower, Wynelle Fanny, MD  CHIEF COMPLAINT:  Chief Complaint  Patient presents with   Lung Cancer    DIAGNOSIS: The encounter diagnosis was Lung nodule.   PREVIOUS INVESTIGATIONS:  CT scans reviewed PET CT scan ordered Clinical notes reviewed Labs reviewed  HPI: Patient is a 71 year old female well-known to our department having been treated over 5 years prior to her right breast for ER/PR positive invasive mammary carcinoma.  She did well from that standpoint.  Dr. Patsey Berthold has been following a spiculated nodule in the right upper lobe concerning for primary bronchogenic carcinoma.  She also has an 8 mm nodule in the periphery of the left lower lobe was not significantly changed over time.  The right upper lobe lesion has been progressing she has a PET CT scan ordered.  She has significant comorbidities including MI has a pacemaker placed PAF COPD and is on nasal oxygen.  She has a mild nonproductive cough.  No hemoptysis or chest tightness.  PLANNED TREATMENT REGIMEN: Review PET and probable SBRT  PAST MEDICAL HISTORY:  has a past medical history of Allergic rhinitis, Arthritis, Asthma, Breast cancer ([place]) (12/2015), Colon polyps, Complete heart block ([place]) (09/2011), COPD (chronic obstructive pulmonary disease) ([place]), Myocardial infarction ([place]) (2011), Osteopenia (10/2015), Pacemaker, Paroxysmal atrial fibrillation ([place]) (03/2021), Personal history of radiation therapy (2017), Seizure disorder ([place]), Seizures ([place]), Small bowel obstruction ([place]), and Tobacco abuse.    PAST SURGICAL HISTORY:  Past Surgical History:  Procedure Laterality Date   ABDOMINAL  HYSTERECTOMY     BREAST BIOPSY Right 2007   benign inflammatory changes, mass due to underwire bra   BREAST BIOPSY Left 01/02/2016   columnar cell changes without atypical hyperplasia.   BREAST BIOPSY Right 12/06/2015   rt breast mass 10:00, bx done at Dr. [place] office, invasive ductal carcinoma   BREAST EXCISIONAL BIOPSY Left 01/02/2016   COLUMNAR CELL CHANGE AND HYPERPLASIA ASSOCIATED WITH LUMINAL AND STROMAL CALCIFICATIONS   BREAST LUMPECTOMY Right 01/02/2016   invasive mammary carcinoma, clear margins, negative LN   BREAST LUMPECTOMY WITH SENTINEL LYMPH NODE BIOPSY Right 01/02/2016   pT1c, N0; ER/ PR 100%; Her 2 neu not over expressed: BREAST LUMPECTOMY WITH SENTINEL LYMPH NODE BX;  Surgeon: [name], MD;  Location: [facility] ORS;  Service: General;  Laterality: Right;   CARDIAC CATHETERIZATION     CATARACT EXTRACTION Bilateral    COLONOSCOPY  10/2015   Dr Ardis Hughs   COLONOSCOPY WITH PROPOFOL N/A 08/19/2018   Procedure: COLONOSCOPY WITH PROPOFOL;  Surgeon: Virgel Manifold, MD;  Location: [facility] ENDOSCOPY;  Service: Endoscopy;  Laterality: N/A;   ESOPHAGOGASTRODUODENOSCOPY (EGD) WITH PROPOFOL N/A 08/19/2018   Procedure: ESOPHAGOGASTRODUODENOSCOPY (EGD) WITH PROPOFOL;  Surgeon: Virgel Manifold, MD;  Location: [facility] ENDOSCOPY;  Service: Endoscopy;  Laterality: N/A;   EXPLORATORY LAPAROTOMY  01/25/2001   Exploratory laparotomy, lysis of adhesions, identification of internal hernia secondary to omental adhesion. Prolonged postoperative ileus.   gyn surgery  1993   hysterectomy- form endometriosis   LAPAROSCOPY     PACEMAKER INSERTION  10/24/11   [employer] Advantio dual chamber PPM implanted by Dr Bunnie Philips at [facility]  in [place] N/A 12/05/2019   Procedure: PPM GENERATOR CHANGEOUT;  Surgeon: Deboraha Sprang, MD;  Location: [place] [facility];  Service: Cardiovascular;  Laterality: N/A;   RIGHT/LEFT HEART CATH AND CORONARY  ANGIOGRAPHY Bilateral 07/19/2019   Procedure: RIGHT/LEFT HEART CATH AND CORONARY ANGIOGRAPHY;  Surgeon: Nelva Bush, MD;  Location: [place] [facility];  Service: Cardiovascular;  Laterality: Bilateral;   TEMPORARY PACEMAKER N/A 12/05/2019   Procedure: TEMPORARY PACEMAKER;  Surgeon: Deboraha Sprang, MD;  Location: [place] [facility];  Service: Cardiovascular;  Laterality: N/A;    FAMILY HISTORY: family history includes Cancer - Cervical (age of onset: 59) in her daughter; Coronary artery disease in her father; Dementia in her mother; Heart attack (age of onset: 42) in her brother; Heart disease (age of onset: 12) in her mother; Parkinsonism in her father; Stroke in her mother.  SOCIAL HISTORY:  reports that she quit smoking about 11 years ago. Her smoking use included cigarettes. She has a 63.00 pack-year smoking history. She has never used smokeless tobacco. She reports current alcohol use of about 2.0 standard drinks of alcohol per week. She reports that she does not use drugs.  ALLERGIES: Codeine and Morphine and related  MEDICATIONS:  Current Outpatient Medications  Medication Sig Dispense Refill   acetaminophen (TYLENOL) 500 MG tablet Take 500-1,000 mg by mouth every 6 (six) hours as needed for mild pain or headache.      albuterol (VENTOLIN HFA) 108 (90 Base) MCG/ACT inhaler Inhale 2 puffs into the lungs every 4 (four) hours as needed for wheezing or shortness of breath. 18 g 1   apixaban (ELIQUIS) 5 MG TABS tablet Take 1 tablet by mouth twice daily 180 tablet 1   atorvastatin (LIPITOR) 40 MG tablet Take 1 tablet by mouth once daily 90 tablet 2   benzonatate (TESSALON) 100 MG capsule Take by mouth as needed.     feeding supplement (ENSURE ENLIVE / ENSURE PLUS) LIQD Take 237 mLs by mouth 3 (three) times daily between meals. 237 mL 12   FLUoxetine (PROZAC) 20 MG capsule Take 1 capsule by mouth once daily 90 capsule 3   furosemide (LASIX) 40 MG tablet Take 1 tablet (40 mg total) by  mouth daily as needed (swelling, wt gain or needing more oxygen). 90 tablet 0   levETIRAcetam (KEPPRA) 250 MG tablet Take 250 mg by mouth 2 (two) times daily.      loratadine (CLARITIN) 10 MG tablet Take 1 tablet (10 mg total) by mouth daily.     methylPREDNISolone (MEDROL DOSEPAK) 4 MG TBPK tablet One dosepak 1 each 0   Multiple Vitamin (MULTIVITAMIN WITH MINERALS) TABS tablet Take 1 tablet by mouth daily.     omeprazole (PRILOSEC) 40 MG capsule Take 40 mg by mouth daily.     OXYGEN Inhale 3 L into the lungs at bedtime.      predniSONE (DELTASONE) 20 MG tablet Take 1 tablet (20 mg total) by mouth daily with breakfast. 5 tablet 0   STIOLTO RESPIMAT 2.5-2.5 MCG/ACT AERS INHALE 2 PUFFS BY MOUTH ONCE DAILY 4 g 0   VITAMIN D PO Take 1 tablet by mouth daily.     zolpidem (AMBIEN) 10 MG tablet TAKE 1/2 TO 1 (ONE-HALF TO ONE) TABLET BY MOUTH AT BEDTIME AS NEEDED FOR SLEEP 30 tablet 3   tretinoin (RETIN-A) 0.1 % cream Apply topically at bedtime. (Patient not taking: Reported on 12/17/2021) 45 g 1   No current facility-administered  medications for this encounter.    ECOG PERFORMANCE STATUS:  0 - Asymptomatic  REVIEW OF SYSTEMS: Patient has multiple medical comorbidities including MI COPD oxygen dependent Patient denies any weight loss, fatigue, weakness, fever, chills or night sweats. Patient denies any loss of vision, blurred vision. Patient denies any ringing  of the ears or hearing loss. No irregular heartbeat. Patient denies heart murmur or history of fainting. Patient denies any chest pain or pain radiating to her upper extremities. Patient denies any shortness of breath, difficulty breathing at night, cough or hemoptysis. Patient denies any swelling in the lower legs. Patient denies any nausea vomiting, vomiting of blood, or coffee ground material in the vomitus. Patient denies any stomach pain. Patient states has had normal bowel movements no significant constipation or diarrhea. Patient denies any  dysuria, hematuria or significant nocturia. Patient denies any problems walking, swelling in the joints or loss of balance. Patient denies any skin changes, loss of hair or loss of weight. Patient denies any excessive worrying or anxiety or significant depression. Patient denies any problems with insomnia. Patient denies excessive thirst, polyuria, polydipsia. Patient denies any swollen glands, patient denies easy bruising or easy bleeding. Patient denies any recent infections, allergies or URI. Patient "s visual fields have not changed significantly in recent time.   PHYSICAL EXAM: BP 117/66 (BP Location: Left Arm, Patient Position: Sitting, Cuff Size: Normal)   Pulse 70   Temp 98 F (36.7 C) (Tympanic)   Resp 20   Ht 5\' 8"  (1.727 m) Comment: stated HT  Wt 192 lb 4.8 oz (87.2 kg)   BMI 29.24 kg/m  Patient is on nasal oxygen.  Well-developed well-nourished patient in NAD. HEENT reveals PERLA, EOMI, discs not visualized.  Oral cavity is clear. No oral mucosal lesions are identified. Neck is clear without evidence of cervical or supraclavicular adenopathy. Lungs are clear to A&P. Cardiac examination is essentially unremarkable with regular rate and rhythm without murmur rub or thrill. Abdomen is benign with no organomegaly or masses noted. Motor sensory and DTR levels are equal and symmetric in the upper and lower extremities. Cranial nerves II through XII are grossly intact. Proprioception is intact. No peripheral adenopathy or edema is identified. No motor or sensory levels are noted. Crude visual fields are within normal range.  LABORATORY DATA: Labs reviewed    RADIOLOGY RESULTS: See the scan reviewed compatible with above-stated findings PET CT scan has been ordered   IMPRESSION: Probable stage I non-small cell lung cancer of the right upper lobe in 71 year old female  PLAN: At this time elect to review the PET scan should this be hypermetabolic we will go ahead with SBRT to the right upper  lobe nodule.  We will plan on 60 Gray in 5 fractions.  Risks and benefits of treatment including extremely low side effect profile possible development of cough and slight fatigue all were explained in detail to the patient.  She comprehends my recommendations well.  I tentatively set up CT simulation after her PET scan we will review PET before making final determination.  Patient comprehends my recommendations well.  I would like to take this opportunity to thank you for allowing me to participate in the care of your patient.Noreene Filbert, MD

## 2021-12-26 ENCOUNTER — Telehealth: Payer: Self-pay

## 2021-12-26 NOTE — Chronic Care Management (AMB) (Addendum)
Chronic Care Management Pharmacy Assistant   Name: Diana Cook  MRN: [id number] DOB: January 06, 1951  Reason for Encounter: Hospital Follow Up non CCM    Medications: Outpatient Encounter Medications as of 12/26/2021  Medication Sig   acetaminophen (TYLENOL) 500 MG tablet Take 500-1,000 mg by mouth every 6 (six) hours as needed for mild pain or headache.    albuterol (VENTOLIN HFA) 108 (90 Base) MCG/ACT inhaler Inhale 2 puffs into the lungs every 4 (four) hours as needed for wheezing or shortness of breath.   apixaban (ELIQUIS) 5 MG TABS tablet Take 1 tablet by mouth twice daily   atorvastatin (LIPITOR) 40 MG tablet Take 1 tablet by mouth once daily   benzonatate (TESSALON) 100 MG capsule Take by mouth as needed.   feeding supplement (ENSURE ENLIVE / ENSURE PLUS) LIQD Take 237 mLs by mouth 3 (three) times daily between meals.   FLUoxetine (PROZAC) 20 MG capsule Take 1 capsule by mouth once daily   furosemide (LASIX) 40 MG tablet Take 1 tablet (40 mg total) by mouth daily as needed (swelling, wt gain or needing more oxygen).   levETIRAcetam (KEPPRA) 250 MG tablet Take 250 mg by mouth 2 (two) times daily.    loratadine (CLARITIN) 10 MG tablet Take 1 tablet (10 mg total) by mouth daily.   methylPREDNISolone (MEDROL DOSEPAK) 4 MG TBPK tablet One dosepak   Multiple Vitamin (MULTIVITAMIN WITH MINERALS) TABS tablet Take 1 tablet by mouth daily.   omeprazole (PRILOSEC) 40 MG capsule Take 40 mg by mouth daily.   OXYGEN Inhale 3 L into the lungs at bedtime.    predniSONE (DELTASONE) 20 MG tablet Take 1 tablet (20 mg total) by mouth daily with breakfast.   STIOLTO RESPIMAT 2.5-2.5 MCG/ACT AERS INHALE 2 PUFFS BY MOUTH ONCE DAILY   tretinoin (RETIN-A) 0.1 % cream Apply topically at bedtime. (Patient not taking: Reported on 12/17/2021)   VITAMIN D PO Take 1 tablet by mouth daily.   zolpidem (AMBIEN) 10 MG tablet TAKE 1/2 TO 1 (ONE-HALF TO ONE) TABLET BY MOUTH AT BEDTIME AS NEEDED FOR SLEEP   No  facility-administered encounter medications on file as of 12/26/2021.      Reviewed hospital notes for details of recent visit. Has patient been contacted by Transitions of Care team? No Has patient seen PCP/specialist for hospital follow up (summarize OV if yes): No  Admitted to the ED on 12/20/21. Discharge date was 12/20/21.  Discharged from [facility] ED.   Discharge diagnosis (Principal Problem): Acute Cough Patient was discharged to Home  Brief summary of hospital course:       X-ray ordered for the but differential on my review and interpretation does not show any evidence of infiltrate or consolidation.  She does not have any significant wheezing on exam she is on Eliquis and is compliant with this medication does not seem consistent with PE.  Viral panel sent as the patient may have flu but she is not hypoxic states that she feels comfortable with discharge home.  Will be placed on Medrol dose pack and taper after discussion with the patient's pulmonologist.  At this point do believe she stable and appropriate for outpatient follow-up.   New?Medications Started at [facility] Discharge:?? -Started methylprednisolone   Medications that remain the same after Hospital Discharge:??  -All other medications will remain the same.    Next CCM appt: none  Other upcoming appts: Pulmonology appointment with on 02/19/22  mammogram 01/01/22  Pet Scan  12/31/21  Mendel Ryder  Foltanski, PharmD notified and will determine if action is needed.    Avel Sensor, [place]  [phone]   Pharmacist addendum: Patient has been in contact with pulmonology. Symptoms at baseline. On oxygen. No further action needed.  Charlene Brooke, PharmD, BCACP 01/15/22 12:13 PM

## 2021-12-28 ENCOUNTER — Other Ambulatory Visit: Payer: Self-pay | Admitting: Pulmonary Disease

## 2021-12-31 ENCOUNTER — Encounter
Admission: RE | Admit: 2021-12-31 | Discharge: 2021-12-31 | Disposition: A | Discharge status: Home / Self Care | Payer: Medicare Other | Source: Ambulatory Visit | Attending: Pulmonary Disease | Admitting: Pulmonary Disease

## 2021-12-31 DIAGNOSIS — I2510 Atherosclerotic heart disease of native coronary artery without angina pectoris: Secondary | ICD-10-CM | POA: Insufficient documentation

## 2021-12-31 DIAGNOSIS — R918 Other nonspecific abnormal finding of lung field: Secondary | ICD-10-CM | POA: Diagnosis not present

## 2021-12-31 DIAGNOSIS — R059 Cough, unspecified: Secondary | ICD-10-CM | POA: Diagnosis not present

## 2021-12-31 DIAGNOSIS — I700 Atherosclerosis of aorta: Secondary | ICD-10-CM | POA: Diagnosis not present

## 2021-12-31 DIAGNOSIS — Z950 Presence of cardiac pacemaker: Secondary | ICD-10-CM | POA: Diagnosis not present

## 2021-12-31 DIAGNOSIS — R911 Solitary pulmonary nodule: Secondary | ICD-10-CM | POA: Insufficient documentation

## 2021-12-31 DIAGNOSIS — C3432 Malignant neoplasm of lower lobe, left bronchus or lung: Secondary | ICD-10-CM | POA: Diagnosis not present

## 2021-12-31 DIAGNOSIS — Z87891 Personal history of nicotine dependence: Secondary | ICD-10-CM | POA: Diagnosis not present

## 2021-12-31 DIAGNOSIS — Z853 Personal history of malignant neoplasm of breast: Secondary | ICD-10-CM | POA: Diagnosis not present

## 2021-12-31 DIAGNOSIS — C3411 Malignant neoplasm of upper lobe, right bronchus or lung: Secondary | ICD-10-CM | POA: Diagnosis not present

## 2021-12-31 LAB — GLUCOSE, CAPILLARY: Glucose-Capillary: 91 mg/dL (ref 70–99)

## 2021-12-31 MED ORDER — FLUDEOXYGLUCOSE F - 18 (FDG) INJECTION
9.7000 | Freq: Once | INTRAVENOUS | Status: AC | PRN
  Administered 2021-12-31: 9.7 via INTRAVENOUS

## 2022-01-01 ENCOUNTER — Ambulatory Visit
Admission: RE | Admit: 2022-01-01 | Discharge: 2022-01-01 | Disposition: A | Discharge status: Home / Self Care | Payer: Medicare Other | Source: Ambulatory Visit | Attending: General Surgery | Admitting: General Surgery

## 2022-01-01 DIAGNOSIS — Z1231 Encounter for screening mammogram for malignant neoplasm of breast: Secondary | ICD-10-CM | POA: Insufficient documentation

## 2022-01-02 ENCOUNTER — Ambulatory Visit
Admission: RE | Admit: 2022-01-02 | Discharge: 2022-01-02 | Disposition: A | Discharge status: Home / Self Care | Payer: Medicare Other | Source: Ambulatory Visit | Attending: Radiation Oncology | Admitting: Radiation Oncology

## 2022-01-02 DIAGNOSIS — Z510 Encounter for antineoplastic radiation therapy: Secondary | ICD-10-CM | POA: Diagnosis not present

## 2022-01-02 DIAGNOSIS — R911 Solitary pulmonary nodule: Secondary | ICD-10-CM | POA: Diagnosis not present

## 2022-01-02 DIAGNOSIS — Z853 Personal history of malignant neoplasm of breast: Secondary | ICD-10-CM | POA: Diagnosis not present

## 2022-01-02 DIAGNOSIS — Z87891 Personal history of nicotine dependence: Secondary | ICD-10-CM | POA: Diagnosis not present

## 2022-01-02 DIAGNOSIS — C3411 Malignant neoplasm of upper lobe, right bronchus or lung: Secondary | ICD-10-CM | POA: Diagnosis not present

## 2022-01-02 DIAGNOSIS — C3432 Malignant neoplasm of lower lobe, left bronchus or lung: Secondary | ICD-10-CM | POA: Diagnosis not present

## 2022-01-09 DIAGNOSIS — Z853 Personal history of malignant neoplasm of breast: Secondary | ICD-10-CM | POA: Diagnosis not present

## 2022-01-09 DIAGNOSIS — R911 Solitary pulmonary nodule: Secondary | ICD-10-CM | POA: Diagnosis not present

## 2022-01-09 DIAGNOSIS — C3432 Malignant neoplasm of lower lobe, left bronchus or lung: Secondary | ICD-10-CM | POA: Diagnosis not present

## 2022-01-09 DIAGNOSIS — Z510 Encounter for antineoplastic radiation therapy: Secondary | ICD-10-CM | POA: Diagnosis not present

## 2022-01-09 DIAGNOSIS — Z87891 Personal history of nicotine dependence: Secondary | ICD-10-CM | POA: Diagnosis not present

## 2022-01-09 DIAGNOSIS — C3411 Malignant neoplasm of upper lobe, right bronchus or lung: Secondary | ICD-10-CM | POA: Diagnosis not present

## 2022-01-09 DIAGNOSIS — Z83719 Family history of colon polyps, unspecified: Secondary | ICD-10-CM | POA: Diagnosis not present

## 2022-01-10 ENCOUNTER — Telehealth: Payer: Self-pay

## 2022-01-10 NOTE — Telephone Encounter (Signed)
Spoke with patient, informed her that due to her being dependent on her PPM and field of radiation <5cm from device that we would like to have her send in weekly transmission from her home monitor to ensure there is no interference, patient voiced understanding also relayed this information to [place].

## 2022-01-13 ENCOUNTER — Other Ambulatory Visit: Payer: Self-pay

## 2022-01-13 ENCOUNTER — Ambulatory Visit
Admission: RE | Admit: 2022-01-13 | Discharge: 2022-01-13 | Disposition: A | Discharge status: Home / Self Care | Payer: Medicare Other | Source: Ambulatory Visit | Attending: Radiation Oncology | Admitting: Radiation Oncology

## 2022-01-13 DIAGNOSIS — Z510 Encounter for antineoplastic radiation therapy: Secondary | ICD-10-CM | POA: Diagnosis not present

## 2022-01-13 DIAGNOSIS — G479 Sleep disorder, unspecified: Secondary | ICD-10-CM | POA: Diagnosis not present

## 2022-01-13 DIAGNOSIS — R911 Solitary pulmonary nodule: Secondary | ICD-10-CM | POA: Diagnosis not present

## 2022-01-13 DIAGNOSIS — R569 Unspecified convulsions: Secondary | ICD-10-CM | POA: Diagnosis not present

## 2022-01-13 DIAGNOSIS — R5383 Other fatigue: Secondary | ICD-10-CM | POA: Diagnosis not present

## 2022-01-13 LAB — RAD ONC ARIA SESSION SUMMARY
Course Elapsed Days: 0
Plan Fractions Treated to Date: 1
Plan Prescribed Dose Per Fraction: 12 Gy
Plan Total Fractions Prescribed: 5
Plan Total Prescribed Dose: 60 Gy
Reference Point Dosage Given to Date: 12 Gy
Reference Point Session Dosage Given: 12 Gy
Session Number: 1

## 2022-01-15 ENCOUNTER — Ambulatory Visit
Admission: RE | Admit: 2022-01-15 | Discharge: 2022-01-15 | Disposition: A | Discharge status: Home / Self Care | Payer: Medicare Other | Source: Ambulatory Visit | Attending: Radiation Oncology | Admitting: Radiation Oncology

## 2022-01-15 ENCOUNTER — Other Ambulatory Visit: Payer: Self-pay

## 2022-01-15 DIAGNOSIS — R911 Solitary pulmonary nodule: Secondary | ICD-10-CM | POA: Diagnosis not present

## 2022-01-15 DIAGNOSIS — M48062 Spinal stenosis, lumbar region with neurogenic claudication: Secondary | ICD-10-CM | POA: Diagnosis not present

## 2022-01-15 DIAGNOSIS — M5416 Radiculopathy, lumbar region: Secondary | ICD-10-CM | POA: Diagnosis not present

## 2022-01-15 DIAGNOSIS — Z510 Encounter for antineoplastic radiation therapy: Secondary | ICD-10-CM | POA: Diagnosis not present

## 2022-01-15 LAB — RAD ONC ARIA SESSION SUMMARY
Course Elapsed Days: 2
Plan Fractions Treated to Date: 2
Plan Prescribed Dose Per Fraction: 12 Gy
Plan Total Fractions Prescribed: 5
Plan Total Prescribed Dose: 60 Gy
Reference Point Dosage Given to Date: 24 Gy
Reference Point Session Dosage Given: 12 Gy
Session Number: 2

## 2022-01-20 ENCOUNTER — Ambulatory Visit
Admission: RE | Admit: 2022-01-20 | Discharge: 2022-01-20 | Disposition: A | Discharge status: Home / Self Care | Payer: Medicare Other | Source: Ambulatory Visit | Attending: Radiation Oncology | Admitting: Radiation Oncology

## 2022-01-20 ENCOUNTER — Other Ambulatory Visit: Payer: Self-pay

## 2022-01-20 DIAGNOSIS — Z510 Encounter for antineoplastic radiation therapy: Secondary | ICD-10-CM | POA: Diagnosis not present

## 2022-01-20 DIAGNOSIS — R911 Solitary pulmonary nodule: Secondary | ICD-10-CM | POA: Diagnosis not present

## 2022-01-20 LAB — RAD ONC ARIA SESSION SUMMARY
Course Elapsed Days: 7
Plan Fractions Treated to Date: 3
Plan Prescribed Dose Per Fraction: 12 Gy
Plan Total Fractions Prescribed: 5
Plan Total Prescribed Dose: 60 Gy
Reference Point Dosage Given to Date: 36 Gy
Reference Point Session Dosage Given: 12 Gy
Session Number: 3

## 2022-01-22 ENCOUNTER — Other Ambulatory Visit: Payer: Self-pay

## 2022-01-22 ENCOUNTER — Ambulatory Visit
Admission: RE | Admit: 2022-01-22 | Discharge: 2022-01-22 | Disposition: A | Discharge status: Home / Self Care | Payer: Medicare Other | Source: Ambulatory Visit | Attending: Radiation Oncology | Admitting: Radiation Oncology

## 2022-01-22 DIAGNOSIS — R911 Solitary pulmonary nodule: Secondary | ICD-10-CM | POA: Diagnosis not present

## 2022-01-22 DIAGNOSIS — Z510 Encounter for antineoplastic radiation therapy: Secondary | ICD-10-CM | POA: Diagnosis not present

## 2022-01-22 LAB — RAD ONC ARIA SESSION SUMMARY
Course Elapsed Days: 9
Plan Fractions Treated to Date: 4
Plan Prescribed Dose Per Fraction: 12 Gy
Plan Total Fractions Prescribed: 5
Plan Total Prescribed Dose: 60 Gy
Reference Point Dosage Given to Date: 48 Gy
Reference Point Session Dosage Given: 12 Gy
Session Number: 4

## 2022-01-23 ENCOUNTER — Ambulatory Visit (HOSPITAL_COMMUNITY)
Admission: RE | Admit: 2022-01-23 | Discharge: 2022-01-23 | Disposition: A | Discharge status: Home / Self Care | Payer: Medicare Other | Source: Ambulatory Visit | Attending: Orthopedic Surgery | Admitting: Orthopedic Surgery

## 2022-01-23 DIAGNOSIS — M67813 Other specified disorders of tendon, right shoulder: Secondary | ICD-10-CM | POA: Insufficient documentation

## 2022-01-23 DIAGNOSIS — M25511 Pain in right shoulder: Secondary | ICD-10-CM | POA: Diagnosis not present

## 2022-01-27 ENCOUNTER — Ambulatory Visit
Admission: RE | Admit: 2022-01-27 | Discharge: 2022-01-27 | Disposition: A | Discharge status: Home / Self Care | Payer: Medicare Other | Source: Ambulatory Visit | Attending: Radiation Oncology | Admitting: Radiation Oncology

## 2022-01-27 ENCOUNTER — Other Ambulatory Visit: Payer: Self-pay

## 2022-01-27 DIAGNOSIS — Z510 Encounter for antineoplastic radiation therapy: Secondary | ICD-10-CM | POA: Insufficient documentation

## 2022-01-27 DIAGNOSIS — Z853 Personal history of malignant neoplasm of breast: Secondary | ICD-10-CM | POA: Diagnosis not present

## 2022-01-27 DIAGNOSIS — C3432 Malignant neoplasm of lower lobe, left bronchus or lung: Secondary | ICD-10-CM | POA: Diagnosis not present

## 2022-01-27 DIAGNOSIS — R918 Other nonspecific abnormal finding of lung field: Secondary | ICD-10-CM | POA: Diagnosis not present

## 2022-01-27 DIAGNOSIS — C3411 Malignant neoplasm of upper lobe, right bronchus or lung: Secondary | ICD-10-CM | POA: Diagnosis not present

## 2022-01-27 DIAGNOSIS — R911 Solitary pulmonary nodule: Secondary | ICD-10-CM | POA: Insufficient documentation

## 2022-01-27 DIAGNOSIS — Z87891 Personal history of nicotine dependence: Secondary | ICD-10-CM | POA: Diagnosis not present

## 2022-01-27 LAB — RAD ONC ARIA SESSION SUMMARY
Course Elapsed Days: 14
Plan Fractions Treated to Date: 5
Plan Prescribed Dose Per Fraction: 12 Gy
Plan Total Fractions Prescribed: 5
Plan Total Prescribed Dose: 60 Gy
Reference Point Dosage Given to Date: 60 Gy
Reference Point Session Dosage Given: 12 Gy
Session Number: 5

## 2022-01-29 DIAGNOSIS — M75121 Complete rotator cuff tear or rupture of right shoulder, not specified as traumatic: Secondary | ICD-10-CM | POA: Diagnosis not present

## 2022-02-05 DIAGNOSIS — M67431 Ganglion, right wrist: Secondary | ICD-10-CM | POA: Diagnosis not present

## 2022-02-05 DIAGNOSIS — G40909 Epilepsy, unspecified, not intractable, without status epilepticus: Secondary | ICD-10-CM | POA: Diagnosis not present

## 2022-02-05 DIAGNOSIS — R42 Dizziness and giddiness: Secondary | ICD-10-CM | POA: Diagnosis not present

## 2022-02-05 DIAGNOSIS — I252 Old myocardial infarction: Secondary | ICD-10-CM | POA: Diagnosis not present

## 2022-02-05 DIAGNOSIS — Z7901 Long term (current) use of anticoagulants: Secondary | ICD-10-CM | POA: Diagnosis not present

## 2022-02-05 DIAGNOSIS — J449 Chronic obstructive pulmonary disease, unspecified: Secondary | ICD-10-CM | POA: Diagnosis not present

## 2022-02-05 DIAGNOSIS — S46011D Strain of muscle(s) and tendon(s) of the rotator cuff of right shoulder, subsequent encounter: Secondary | ICD-10-CM | POA: Diagnosis not present

## 2022-02-05 DIAGNOSIS — Z85118 Personal history of other malignant neoplasm of bronchus and lung: Secondary | ICD-10-CM | POA: Diagnosis not present

## 2022-02-05 DIAGNOSIS — Z9981 Dependence on supplemental oxygen: Secondary | ICD-10-CM | POA: Diagnosis not present

## 2022-02-05 DIAGNOSIS — M069 Rheumatoid arthritis, unspecified: Secondary | ICD-10-CM | POA: Diagnosis not present

## 2022-02-05 DIAGNOSIS — Z9181 History of falling: Secondary | ICD-10-CM | POA: Diagnosis not present

## 2022-02-05 DIAGNOSIS — Z87891 Personal history of nicotine dependence: Secondary | ICD-10-CM | POA: Diagnosis not present

## 2022-02-05 DIAGNOSIS — Z853 Personal history of malignant neoplasm of breast: Secondary | ICD-10-CM | POA: Diagnosis not present

## 2022-02-05 DIAGNOSIS — I8390 Asymptomatic varicose veins of unspecified lower extremity: Secondary | ICD-10-CM | POA: Diagnosis not present

## 2022-02-05 DIAGNOSIS — W19XXXD Unspecified fall, subsequent encounter: Secondary | ICD-10-CM | POA: Diagnosis not present

## 2022-02-05 DIAGNOSIS — M549 Dorsalgia, unspecified: Secondary | ICD-10-CM | POA: Diagnosis not present

## 2022-02-05 DIAGNOSIS — M1990 Unspecified osteoarthritis, unspecified site: Secondary | ICD-10-CM | POA: Diagnosis not present

## 2022-02-05 DIAGNOSIS — F419 Anxiety disorder, unspecified: Secondary | ICD-10-CM | POA: Diagnosis not present

## 2022-02-10 DIAGNOSIS — G40909 Epilepsy, unspecified, not intractable, without status epilepticus: Secondary | ICD-10-CM | POA: Diagnosis not present

## 2022-02-10 DIAGNOSIS — W19XXXD Unspecified fall, subsequent encounter: Secondary | ICD-10-CM | POA: Diagnosis not present

## 2022-02-10 DIAGNOSIS — S46011D Strain of muscle(s) and tendon(s) of the rotator cuff of right shoulder, subsequent encounter: Secondary | ICD-10-CM | POA: Diagnosis not present

## 2022-02-10 DIAGNOSIS — J449 Chronic obstructive pulmonary disease, unspecified: Secondary | ICD-10-CM | POA: Diagnosis not present

## 2022-02-10 DIAGNOSIS — M069 Rheumatoid arthritis, unspecified: Secondary | ICD-10-CM | POA: Diagnosis not present

## 2022-02-10 DIAGNOSIS — M67431 Ganglion, right wrist: Secondary | ICD-10-CM | POA: Diagnosis not present

## 2022-02-12 ENCOUNTER — Encounter: Payer: Self-pay | Admitting: Emergency Medicine

## 2022-02-12 ENCOUNTER — Emergency Department: Payer: Medicare Other

## 2022-02-12 ENCOUNTER — Telehealth: Payer: Self-pay | Admitting: Family Medicine

## 2022-02-12 ENCOUNTER — Emergency Department
Admission: EM | Admit: 2022-02-12 | Discharge: 2022-02-12 | Disposition: A | Discharge status: Home / Self Care | Payer: Medicare Other | Attending: Emergency Medicine | Admitting: Emergency Medicine

## 2022-02-12 ENCOUNTER — Other Ambulatory Visit: Payer: Self-pay

## 2022-02-12 DIAGNOSIS — Z950 Presence of cardiac pacemaker: Secondary | ICD-10-CM | POA: Insufficient documentation

## 2022-02-12 DIAGNOSIS — R42 Dizziness and giddiness: Secondary | ICD-10-CM | POA: Diagnosis not present

## 2022-02-12 DIAGNOSIS — R4182 Altered mental status, unspecified: Secondary | ICD-10-CM | POA: Diagnosis not present

## 2022-02-12 DIAGNOSIS — I10 Essential (primary) hypertension: Secondary | ICD-10-CM | POA: Diagnosis not present

## 2022-02-12 DIAGNOSIS — H6501 Acute serous otitis media, right ear: Secondary | ICD-10-CM | POA: Diagnosis not present

## 2022-02-12 DIAGNOSIS — Z7901 Long term (current) use of anticoagulants: Secondary | ICD-10-CM | POA: Diagnosis not present

## 2022-02-12 DIAGNOSIS — I6381 Other cerebral infarction due to occlusion or stenosis of small artery: Secondary | ICD-10-CM | POA: Diagnosis not present

## 2022-02-12 LAB — CBC
HCT: 41.2 % (ref 36.0–46.0)
Hemoglobin: 13.2 g/dL (ref 12.0–15.0)
MCH: 29.3 pg (ref 26.0–34.0)
MCHC: 32 g/dL (ref 30.0–36.0)
MCV: 91.6 fL (ref 80.0–100.0)
Platelets: 233 10*3/uL (ref 150–400)
RBC: 4.5 MIL/uL (ref 3.87–5.11)
RDW: 13.2 % (ref 11.5–15.5)
WBC: 6.2 10*3/uL (ref 4.0–10.5)
nRBC: 0 % (ref 0.0–0.2)

## 2022-02-12 LAB — BASIC METABOLIC PANEL
Anion gap: 12 (ref 5–15)
BUN: 16 mg/dL (ref 8–23)
CO2: 21 mmol/L — ABNORMAL LOW (ref 22–32)
Calcium: 10 mg/dL (ref 8.9–10.3)
Chloride: 102 mmol/L (ref 98–111)
Creatinine, Ser: 0.79 mg/dL (ref 0.44–1.00)
GFR, Estimated: >60 mL/min (ref >60)
Glucose, Bld: 96 mg/dL (ref 70–99)
Potassium: 4 mmol/L (ref 3.5–5.1)
Sodium: 135 mmol/L (ref 135–145)

## 2022-02-12 LAB — LIPASE, BLOOD: Lipase: 27 U/L (ref 11–51)

## 2022-02-12 MED ORDER — MECLIZINE HCL 25 MG PO TABS
12.5000 mg | ORAL_TABLET | Freq: Once | ORAL | Status: AC
  Administered 2022-02-12: 12.5 mg via ORAL
  Filled 2022-02-12: qty 1

## 2022-02-12 MED ORDER — PREDNISONE 20 MG PO TABS
40.0000 mg | ORAL_TABLET | Freq: Once | ORAL | Status: AC
  Administered 2022-02-12: 40 mg via ORAL
  Filled 2022-02-12: qty 2

## 2022-02-12 MED ORDER — MECLIZINE HCL 12.5 MG PO TABS: 12.5000 mg | ORAL_TABLET | Freq: Three times a day (TID) | ORAL | 0 refills | Status: DC | PRN

## 2022-02-12 MED ORDER — PREDNISONE 20 MG PO TABS: 40.0000 mg | ORAL_TABLET | Freq: Every day | ORAL | 0 refills | Status: AC

## 2022-02-12 NOTE — Telephone Encounter (Signed)
Patients husband called in stating that patient,his wife has been experiencing dizziness,and vomiting,and has not been able to hold anything down for the last 2 days. She can barely get out of bed. I sent her to acces nurse to be triaged.

## 2022-02-12 NOTE — Telephone Encounter (Signed)
Pt advised by Access Nurse to go to ED.  Pt has checked into the ED at [facility] @ 1:06pm.     [place] RECORD AccessNurse Patient Name: Diana Cook ALL Gender: Female DOB: September 15, 1950 Age: 71 Y 59 M 18 D Return Phone Number: [phone] (Primary) Address: City/ State/ Zip: [place] [address] Client [place] [facility] - Client Client Site [place] Provider Tower, Roque Lias - MD Contact Type Call Who Is Calling Patient / Member / Family / Caregiver Call Type Triage / Clinical Relationship To Patient Self Return Phone Number [phone] (Primary) Chief Complaint Vomiting Reason for Call Symptomatic / Request for Health Information Initial Comment Caller states she is dizzy,vomiting and unable to get out of bed. No blood in vomit and she has urinated in the past 8 hours. Translation No Nurse Assessment Nurse: Lenon Curt, RN, Melanie Date/Time (Eastern Time): 02/12/2022 12:12:55 PM Confirm and document reason for call. If symptomatic, describe symptoms. ---Caller states yesterday got up and was dizzy out of nowhere, vomiting/gagging after getting back to bed. Was feeling bad the night before with intermittent dizziness and nausea. Today not eating or drinking, vomiting green. Ear congestion, right one aches. Radiation finished a few weeks ago for Lung CA and Breast CA 6 years. Does the patient have any new or worsening symptoms? ---Yes Will a triage be completed? ---Yes Related visit to physician within the last 2 weeks? ---No Does the PT have any chronic conditions? (i.e. diabetes, asthma, this includes High risk factors for pregnancy, etc.) ---Yes List chronic conditions. ---Pacemaker, falls Is this a behavioral health or substance abuse call? ---No Guidelines Guideline Title Affirmed Question Affirmed Notes Nurse Date/Time (Eastern Time) Dizziness - Vertigo SEVERE  dizziness (vertigo) (e.g., unable to walk without assistance) Lenon Curt, Therapist, sports, Threasa Beards 02/12/2022 12:15:58 PM PLEASE NOTE: All timestamps contained within this report are represented as [place] Standard Time. CONFIDENTIALTY NOTICE: This fax transmission is intended only for the addressee. It contains information that is legally privileged, confidential or otherwise protected from use or disclosure. If you are not the intended recipient, you are strictly prohibited from reviewing, disclosing, copying using or disseminating any of this information or taking any action in reliance on or regarding this information. If you have received this fax in error, please notify us immediately by telephone so that we can arrange for its return to [place]. Phone: [phone], Toll-Free: [phone], Fax: [phone] Page: 2 of 2 Call Id: [id number] [place]. Time Eilene Ghazi Time) Disposition Final User 02/12/2022 12:19:14 PM Go to ED Now (or PCP triage) Yes Lenon Curt, RN, Melanie Final Disposition 02/12/2022 12:19:14 PM Go to ED Now (or PCP triage) Yes Lenon Curt, RN, Donnajean Lopes Disagree/Comply Comply Caller Understands Yes PreDisposition Call Doctor Care Advice Given Per Guideline GO TO ED NOW (OR PCP TRIAGE): * IF NO PCP (PRIMARY CARE PROVIDER) SECOND-LEVEL TRIAGE: You need to be seen within the next hour. Go to the [facility] at _____________ [place] as soon as you can. CARE ADVICE given per Dizziness - Vertigo (Adult) guideline. * Another adult should drive. * Patient should not delay going to the emergency department. Comments User: Erik Obey, RN Date/Time Eilene Ghazi Time): 02/12/2022 12:17:36 PM Right hand is intermittently going to sleep/numb for about a week. No loss of strength. Rotator cuff tear on that side, PT. Referrals [facility] - ED

## 2022-02-12 NOTE — ED Triage Notes (Signed)
Pt via POV from home. Pt states she has been having dizziness and emesis since yesterday. States that is worse when she moves. Denies pain. Pt is on 4L [place] at baseline for COPD. Pt is A&Ox4 and NAD

## 2022-02-12 NOTE — ED Notes (Signed)
See triage note. To ED for dizziness worse with movement. Wears 4L at baseline. Not dizzy at this moment as she is laying still.

## 2022-02-12 NOTE — ED Notes (Signed)
Pt passed her ambulatory test. She is trying to give a urine sample right now

## 2022-02-12 NOTE — Telephone Encounter (Signed)
Agree with advisement to go to ER Aware, will watch for correspondence

## 2022-02-12 NOTE — ED Notes (Signed)
Provider informed of pts ambulatory test

## 2022-02-12 NOTE — ED Provider Notes (Signed)
[facility] Provider Note    Event Date/Time   First MD Initiated Contact with Patient 02/12/22 1711     (approximate)   History   Dizziness   HPI  Diana Cook is a 71 y.o. female  here with dizziness.  Patient states that for the last 2 to 3 days, she has had fairly persistent intermittent dizziness with movement.  Symptoms seem to be worse with standing up and certain position changes.  She has had some fullness in her right ear with some discomfort.  She states she has no history of vertigo.  Denies any focal numbness or weakness.  No difficulty swallowing or speaking.  No history of recent injuries.  No fevers or chills.  No headaches.  Patient has an extensive past medical history including history of complete heart block status post pacemaker placement, A-fib on Eliquis.     Physical Exam   Triage Vital Signs: ED Triage Vitals  Enc Vitals Group     BP 02/12/22 1320 99/63     Pulse Rate 02/12/22 1320 75     Resp 02/12/22 1320 20     Temp 02/12/22 1320 98.6 F (37 C)     Temp Source 02/12/22 1320 Oral     SpO2 02/12/22 1320 94 %     Weight 02/12/22 1319 108 lb (49 kg)     Height 02/12/22 1319 5\' 3"  (1.6 m)     Head Circumference --      Peak Flow --      Pain Score 02/12/22 1319 0     Pain Loc --      Pain Edu? --      Excl. in [place]? --     Most recent vital signs: Vitals:   02/12/22 1638 02/12/22 1959  BP: 120/70 (!) 142/69  Pulse: 79 72  Resp: 18 16  Temp: 98.3 F (36.8 C) 97.7 F (36.5 C)  SpO2: 99% 97%     General: Awake, no distress.  CV:  Good peripheral perfusion.  Regular rhythm. Resp:  Normal effort.  Abd:  No distention.  No tenderness. Other:  On ENT exam, patient has moderate asymmetric right serous effusion in the tympanic membrane.  No erythema.  No opacification.  No mastoid tenderness.  Oropharynx clear.  On neurological testing, patient has some mild, fatigable, rightward nystagmus with certain position  changes.  This seems to have a mild delay.  It then resolves.  Cranial nerves II through XII otherwise fully intact.  Strength out of 5 bilateral upper and lower extremities.  No dysmetria.  Gait is normal.   ED Results / Procedures / Treatments   Labs (all labs ordered are listed, but only abnormal results are displayed) Labs Reviewed  BASIC METABOLIC PANEL - Abnormal; Notable for the following components:      Result Value   CO2 21 (*)    All other components within normal limits  CBC  LIPASE, BLOOD  CBG MONITORING, ED     EKG Atrial sensed, ventricular paced rhythm.  Rate 74.  QRS 156, QTc 503.  No acute ST elevations or depressions.   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   PROCEDURES:  Critical Care performed: No    MEDICATIONS ORDERED IN ED: Medications  meclizine (ANTIVERT) tablet 12.5 mg (12.5 mg Oral Given 02/12/22 1824)  predniSONE (DELTASONE) tablet 40 mg (40  mg Oral Given 02/12/22 1824)     IMPRESSION / MDM / ASSESSMENT AND PLAN / ED COURSE  I reviewed the triage vital signs and the nursing notes.                              Differential diagnosis includes, but is not limited to, peripheral vertigo, central vertigo, CVA, serous otitis media, neuropathy  Patient's presentation is most consistent with acute presentation with potential threat to life or bodily function.  The patient is on the cardiac monitor to evaluate for evidence of arrhythmia and/or significant heart rate changes.  71 year old female with past medical history including pacemaker, A-fib on Eliquis, hypertension, here with positional dizziness.  Clinically, suspect peripheral vertigo likely related to serous effusion of the right tympanic membrane.  Patient has fatigable, unilateral, horizontal nystagmus that is positional.  She has no  dysarthria, dysphagia, or signs of central etiology.  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  This would not correlate with her symptoms.  She has no numbness or weakness.  She has a history of A-fib and I suspect this is related.  She is already optimally treated on Eliquis, low concern for acute stroke at this time and she is already appropriately treated.  She also cannot obtain an MRI given her pacemaker.  Otherwise, no apparent emergent pathology.  Lab work is very reassuring.  CBC unremarkable with no leukocytosis.  Electrolytes are acceptable. Discussed likelihood of peripheral vertigo but inability to rule out central cause.  Patient feels better and would like to attempt outpatient management which I think is very reasonable given that she is already on appropriate treatment.  Will give a course of steroids, meclizine,      FINAL CLINICAL IMPRESSION(S) / ED DIAGNOSES   Final diagnoses:  Vertigo     Rx / DC Orders   ED Discharge Orders          Ordered    meclizine (ANTIVERT) 12.5 MG tablet  3 times daily PRN        02/12/22 1943    predniSONE (DELTASONE) 20 MG tablet  Daily        02/12/22 1943             Note:  This document was prepared using Dragon voice recognition software and may include unintentional dictation errors.   Duffy Bruce, MD 02/12/22 2145

## 2022-02-12 NOTE — Discharge Instructions (Addendum)
I'd recommend taking the course of prednisone and antivertigo medicines for now.  After the steroids, you can consider starting flonase (can be purchased over-the-counter) to help facilitate drainage from your nose  Follow-up with an ENT in 1-2 weeks

## 2022-02-12 NOTE — ED Notes (Signed)
Pt brought to car via wheel chair with oxygen. Pt states that she has portable oxygen in her car. Vitals stable. Alert and oriented x4

## 2022-02-12 NOTE — ED Provider Triage Note (Signed)
  Emergency Medicine Provider Triage Evaluation Note  Diana Cook , a 71 y.o.female,  was evaluated in triage.  Pt complains of vertigo/dizziness.  Patient states that this started yesterday, reports significant vertigo whenever she stands up or moves around.  At rest, she is asymptomatic.  Vertigo is associated with emesis as well.  Additionally describes a fullness in her right ear.  No pain at this time.   Review of Systems  Positive: Vertigo Negative: Denies fever, chest pain, vomiting  Physical Exam   Vitals:   02/12/22 1320  BP: 99/63  Pulse: 75  Resp: 20  Temp: 98.6 F (37 C)  SpO2: 94%   Gen:   Awake, no distress   Resp:  Normal effort  MSK:   Moves extremities without difficulty  Other:    Medical Decision Making  Given the patient's initial medical screening exam, the following diagnostic evaluation has been ordered. The patient will be placed in the appropriate treatment space, once one is available, to complete the evaluation and treatment. I have discussed the plan of care with the patient and I have advised the patient that an ED physician or mid-level practitioner will reevaluate their condition after the test results have been received, as the results may give them additional insight into the type of treatment they may need.    Diagnostics: Labs, UA, EKG  Treatments: none immediately   Teodoro Spray, [place] 02/12/22 1432

## 2022-02-18 ENCOUNTER — Other Ambulatory Visit: Payer: Self-pay | Admitting: Family Medicine

## 2022-02-19 ENCOUNTER — Ambulatory Visit: Payer: Medicare Other | Admitting: Pulmonary Disease

## 2022-02-19 NOTE — Telephone Encounter (Signed)
Refill request Ambien Last refill 10/23/21 #30/3 Last office visit 12/16/21

## 2022-02-20 ENCOUNTER — Ambulatory Visit: Payer: Medicare Other | Admitting: Pulmonary Disease

## 2022-02-21 ENCOUNTER — Ambulatory Visit (INDEPENDENT_AMBULATORY_CARE_PROVIDER_SITE_OTHER): Payer: Medicare Other | Admitting: Family Medicine

## 2022-02-21 ENCOUNTER — Encounter: Payer: Self-pay | Admitting: Family Medicine

## 2022-02-21 VITALS — BP 130/72 | HR 67 | Temp 97.2°F

## 2022-02-21 DIAGNOSIS — R42 Dizziness and giddiness: Secondary | ICD-10-CM | POA: Insufficient documentation

## 2022-02-21 DIAGNOSIS — I2510 Atherosclerotic heart disease of native coronary artery without angina pectoris: Secondary | ICD-10-CM | POA: Diagnosis not present

## 2022-02-21 NOTE — Assessment & Plan Note (Signed)
Pt has had this before and feels similar  Unfortunately meclizine and prednisone not helping her much Reviewed hospital records, lab results and studies in detail  Reasurring CT and labs Inst pt to avoid rapid pos change or head movements  Will place urgent ref to ENT for this  She also has f/u with neuro on 1/8  (per ER cannot have mri but if needed she thinks she can with her type of pacer)  Will remain hydrated  Meclizine for nausea as needed Strict Er precautions discussed

## 2022-02-21 NOTE — Patient Instructions (Addendum)
Stay hydrated   Change position slowly  Use a walker or help to get around   I placed a referral to ENT if you don't get a call let us know  You can call Dr Reola Mosher office next week as well   Follow up with neurology on the 8th and discuss the vertigo with them also   Take care of yourself   If symptoms suddenly get severe go back to the ER

## 2022-02-21 NOTE — Progress Notes (Signed)
Subjective:    Patient ID: Diana Cook, female    DOB: June 17, 1950, 71 y.o.   MRN: [id number]  HPI Pt presents with dizziness and nausea   Wt Readings from Last 3 Encounters:  02/12/22 108 lb (49 kg)  12/24/21 192 lb 4.8 oz (87.2 kg)  12/20/21 187 lb 6.3 oz (85 kg)    Pt thinks her symptoms started after shoulder PT   Has had radiation to lungs as well   Seen in ER on 12/20  Presented with 3 d of dizziness worsened by movement  Dx with vertigo which she has had before  Noted R TM serous effusion as well  CT showed old infarct   CT HEAD WO CONTRAST (5MM) (Accession [id number]) (Order [id number]) Imaging Date: 02/12/2022 Department: [place] Released By/Authorizing: Duffy Bruce, MD (auto-released)   Exam Status  Status  Final [99]   PACS Intelerad Image Link   Show images for CT HEAD WO CONTRAST (5MM) Study Result  Narrative & Impression  CLINICAL DATA:  Mental status changes.   EXAM: CT HEAD WITHOUT CONTRAST   TECHNIQUE: Contiguous axial images were obtained from the base of the skull through the vertex without intravenous contrast.   RADIATION DOSE REDUCTION: This exam was performed according to the departmental dose-optimization program which includes automated exposure control, adjustment of the mA and/or kV according to patient size and/or use of iterative reconstruction technique.   COMPARISON:  02/15/2020   FINDINGS: Brain: There is no evidence for acute hemorrhage, hydrocephalus, mass lesion, or abnormal extra-axial fluid collection. No definite CT evidence for acute infarction. Patchy low attenuation in the deep hemispheric and periventricular white matter is nonspecific, but likely reflects chronic microvascular ischemic demyelination. Lacunar infarct in the left basal ganglia is age indeterminate, but new in the interval since prior study.   Vascular: No hyperdense vessel sign.   Skull: No  evidence for fracture. No worrisome lytic or sclerotic lesion.   Sinuses/Orbits: The visualized paranasal sinuses and mastoid air cells are clear. Visualized portions of the globes and intraorbital fat are unremarkable.   Other: None.   IMPRESSION: 1. No acute intracranial abnormality. 2. Lacunar infarct in the left basal ganglia is age indeterminate, but new in the interval since prior study. 3. Chronic small vessel ischemic disease.   Thought she could not have mri due to pacemaker  She thinks she can have one now due to diff type of pacer   Lab Results  Component Value Date   CREATININE 0.79 02/12/2022   BUN 16 02/12/2022   NA 135 02/12/2022   K 4.0 02/12/2022   CL 102 02/12/2022   CO2 21 (L) 02/12/2022   Lab Results  Component Value Date   ALT 17 09/04/2021   AST 24 09/04/2021   ALKPHOS 78 09/04/2021   BILITOT 1.4 (H) 09/04/2021   Lab Results  Component Value Date   WBC 6.2 02/12/2022   HGB 13.2 02/12/2022   HCT 41.2 02/12/2022   MCV 91.6 02/12/2022   PLT 233 02/12/2022   Lab Results  Component Value Date   CALCIUM 10.0 02/12/2022   PHOS 3.3 09/08/2021    She was px meclizine and prednisone  BP Readings from Last 3 Encounters:  02/21/22 130/72  02/12/22 (!) 142/69  12/24/21 117/66   Pulse Readings from Last 3 Encounters:  02/21/22 67  02/12/22 72  12/24/21 70   Takes keppra for a seizure  disorder   No improvement from meclizine surprisingly  She bought bonene and dramamine also    Has neuro appt on 1/8  Has not changed her epilepsy med yet   Feels like fluid in her R ear but it does not hurt  Prednisone did not help at all   Feels like she is spinning  Worst to stand up and walk (better when lying down)   Making her nauseated (no vomiting since ER)   No abd pain   Is able to get fluids in  No urinary symptoms   No nsaids  Occ tylenol   Tried to make appt with ENT Dr Richardson Landry   Patient Active Problem List   Diagnosis Date Noted    Vertigo 02/21/2022   Fall at home 11/25/2021   Right shoulder pain 11/25/2021   Mild anemia 11/03/2021   Fall 09/23/2021   Thyroid nodule 09/17/2021   Hyponatremia 09/17/2021   Sepsis due to gram-negative UTI ([place]) 09/05/2021   Dyslipidemia 09/05/2021   Depression 09/05/2021   GERD without esophagitis 09/05/2021   Vitamin D deficiency 08/20/2021   Colon cancer screening 07/30/2021   Hypercalcemia 07/29/2021   Diarrhea 07/29/2021   UTI (urinary tract infection) 07/17/2021   AKI (acute kidney injury) ([place]) 07/17/2021   Dysuria 07/08/2021   Arterial hypotension 07/08/2021   Paroxysmal atrial fibrillation ([place]) 04/11/2021   Current use of proton pump inhibitor 10/24/2020   Sleep apnea 10/24/2020   Fatigue 10/24/2020   Lightheadedness 05/16/2020   Radicular pain in left arm 03/02/2020   Neck pain on left side 02/15/2020   Paresthesia 02/15/2020   Chronic respiratory failure with hypoxia ([place]) 11/17/2019   Chronic heart failure with preserved ejection fraction (HFpEF) ([place]) 08/11/2019   Coronary artery disease involving native coronary artery of native heart without angina pectoris 06/20/2019   Gastroesophageal reflux disease    Stomach irritation    Abnormal CT scan, esophagus    Polyp of ascending colon    Personal history of tobacco use, presenting hazards to health 01/18/2018   Grade I diastolic dysfunction 62/95/2841   Globus pharyngeus 12/21/2017   Chronic obstructive pulmonary disease ([place]) 12/15/2017   Hyperlipidemia LDL goal <70 12/07/2017   Seizures ([place]) 10/01/2016   Elevated glucose 08/31/2016   Grief reaction 04/11/2016   History of breast cancer 12/11/2015   Osteoporosis 11/22/2015   Estrogen deficiency 09/07/2015   Bruising 09/07/2015   Wrinkles 09/07/2015   Viral URI with cough 05/22/2015   Post herpetic neuralgia 09/01/2014   Encounter for Medicare annual wellness exam 07/11/2014   Routine general medical examination at a health care facility 06/26/2013    H/O small bowel obstruction 04/15/2013   Dyspnea on exertion 02/10/2013   Complete heart block ([place]) 11/03/2011   Pacemaker-[employer] 11/03/2011   Lumbar spinal stenosis 09/02/2011   POSTMENOPAUSAL STATUS 08/06/2009   HIDRADENITIS SUPPURATIVA 06/26/2008   HERNIATED [place] 12/14/2007   BACK, LOWER, PAIN 12/02/2007   Hyperkalemia 08/06/2007   HX, PERSONAL, COLONIC POLYPS 09/14/2006   Former smoker 06/10/2006   ASTHMA 06/10/2006   H/O idiopathic seizure 06/10/2006   Past Medical History:  Diagnosis Date   Allergic rhinitis    Arthritis    Asthma    as a child, mild now   Breast cancer ([place]) 12/2015   right breast cancer, lumpectomy and mammosite    Colon polyps    colonoscopy 7/08, tubular adenoma   Complete heart block ([place]) 09/2011   s/p PPM [facility]   COPD (chronic obstructive pulmonary disease) ([place])  Myocardial infarction [facility]) 2011   Osteopenia 10/2015   Pacemaker    2011   Paroxysmal atrial fibrillation ([place]) 03/2021   Incidentally detected on pacemaker interrogation   Personal history of radiation therapy 2017   right breast ca, mammosite placed   Seizure disorder ([place])    Seizures ([place])    first one was when she was 71 years old    Small bowel obstruction ([place])    1988 and 2002   Tobacco abuse    Past Surgical History:  Procedure Laterality Date   ABDOMINAL HYSTERECTOMY     BREAST BIOPSY Right 2007   benign inflammatory changes, mass due to underwire bra   BREAST BIOPSY Left 01/02/2016   columnar cell changes without atypical hyperplasia.   BREAST BIOPSY Right 12/06/2015   rt breast mass 10:00, bx done at Dr. Curly [place] office, invasive ductal carcinoma   BREAST EXCISIONAL BIOPSY Left 01/02/2016   COLUMNAR CELL CHANGE AND HYPERPLASIA ASSOCIATED WITH LUMINAL AND STROMAL CALCIFICATIONS   BREAST LUMPECTOMY Right 01/02/2016   invasive mammary carcinoma, clear margins, negative LN   BREAST LUMPECTOMY WITH SENTINEL LYMPH NODE BIOPSY  Right 01/02/2016   pT1c, N0; ER/ PR 100%; Her 2 neu not over expressed: BREAST LUMPECTOMY WITH SENTINEL LYMPH NODE BX;  Surgeon: [name], MD;  Location: [facility] ORS;  Service: General;  Laterality: Right;   CARDIAC CATHETERIZATION     CATARACT EXTRACTION Bilateral    COLONOSCOPY  10/2015   Dr Ardis Hughs   COLONOSCOPY WITH PROPOFOL N/A 08/19/2018   Procedure: COLONOSCOPY WITH PROPOFOL;  Surgeon: Virgel Manifold, MD;  Location: [facility] ENDOSCOPY;  Service: Endoscopy;  Laterality: N/A;   ESOPHAGOGASTRODUODENOSCOPY (EGD) WITH PROPOFOL N/A 08/19/2018   Procedure: ESOPHAGOGASTRODUODENOSCOPY (EGD) WITH PROPOFOL;  Surgeon: Virgel Manifold, MD;  Location: [facility] ENDOSCOPY;  Service: Endoscopy;  Laterality: N/A;   EXPLORATORY LAPAROTOMY  01/25/2001   Exploratory laparotomy, lysis of adhesions, identification of internal hernia secondary to omental adhesion. Prolonged postoperative ileus.   gyn surgery  1993   hysterectomy- form endometriosis   LAPAROSCOPY     PACEMAKER INSERTION  10/24/11   [employer] Advantio dual chamber PPM implanted by Dr Bunnie Philips at [facility] in [place] N/A 12/05/2019   Procedure: [place];  Surgeon: Deboraha Sprang, MD;  Location: [place] [facility];  Service: Cardiovascular;  Laterality: N/A;   RIGHT/LEFT HEART CATH AND CORONARY ANGIOGRAPHY Bilateral 07/19/2019   Procedure: RIGHT/LEFT HEART CATH AND CORONARY ANGIOGRAPHY;  Surgeon: Nelva Bush, MD;  Location: [facility] [facility];  Service: Cardiovascular;  Laterality: Bilateral;   TEMPORARY PACEMAKER N/A 12/05/2019   Procedure: TEMPORARY PACEMAKER;  Surgeon: Deboraha Sprang, MD;  Location: [facility];  Service: Cardiovascular;  Laterality: N/A;   Social History   Tobacco Use   Smoking status: Former    Packs/day: 1.50    Years: 42.00    Total pack years: 63.00    Types: Cigarettes    Quit date: 11/11/2010    Years since quitting: 11.2    Smokeless tobacco: Never  Vaping Use   Vaping Use: Never used  Substance Use Topics   Alcohol use: Yes    Alcohol/week: 2.0 standard drinks of alcohol    Types: 2 Glasses of wine per week    Comment: weekly   Drug use: No   Family History  Problem Relation Age of Onset   Stroke Mother    Heart disease Mother 53  MI and CABG   Dementia Mother    Coronary artery disease Father    Parkinsonism Father    Breast cancer Sister    Cancer - Cervical Daughter 64       died March 27, 2022   Heart attack Brother 21   Colon cancer Neg Hx    Allergies  Allergen Reactions   Codeine Nausea And Vomiting   Morphine And Related Nausea Only   Current Outpatient Medications on File Prior to Visit  Medication Sig Dispense Refill   acetaminophen (TYLENOL) 500 MG tablet Take 500-1,000 mg by mouth every 6 (six) hours as needed for mild pain or headache.      albuterol (VENTOLIN HFA) 108 (90 Base) MCG/ACT inhaler Inhale 2 puffs into the lungs every 4 (four) hours as needed for wheezing or shortness of breath. 18 g 1   apixaban (ELIQUIS) 5 MG TABS tablet Take 1 tablet by mouth twice daily 180 tablet 1   atorvastatin (LIPITOR) 40 MG tablet Take 1 tablet by mouth once daily 90 tablet 2   benzonatate (TESSALON) 100 MG capsule Take by mouth as needed.     FLUoxetine (PROZAC) 20 MG capsule Take 1 capsule by mouth once daily 90 capsule 3   furosemide (LASIX) 40 MG tablet Take 1 tablet (40 mg total) by mouth daily as needed (swelling, wt gain or needing more oxygen). 90 tablet 0   levETIRAcetam (KEPPRA) 250 MG tablet Take 250 mg by mouth 2 (two) times daily.      meclizine (ANTIVERT) 12.5 MG tablet Take 1 tablet (12.5 mg total) by mouth 3 (three) times daily as needed for dizziness. 30 tablet 0   Multiple Vitamin (MULTIVITAMIN WITH MINERALS) TABS tablet Take 1 tablet by mouth daily.     omeprazole (PRILOSEC) 40 MG capsule Take 40 mg by mouth daily.     OXYGEN Inhale 4 L into the lungs continuous.      Tiotropium Bromide-Olodaterol (STIOLTO RESPIMAT) 2.5-2.5 MCG/ACT AERS INHALE 2 PUFFS BY MOUTH ONCE DAILY 4 g 1   VITAMIN D PO Take 1 tablet by mouth daily.     zolpidem (AMBIEN) 10 MG tablet TAKE 1/2 TO 1 (ONE-HALF TO ONE) TABLET BY MOUTH AT BEDTIME AS NEEDED FOR SLEEP 30 tablet 3   No current facility-administered medications on file prior to visit.    Review of Systems  Constitutional:  Negative for activity change, appetite change, fatigue, fever and unexpected weight change.  HENT:  Negative for congestion, ear pain, rhinorrhea, sinus pressure and sore throat.   Eyes:  Negative for pain, redness and visual disturbance.  Respiratory:  Negative for cough, shortness of breath and wheezing.   Cardiovascular:  Negative for chest pain and palpitations.  Gastrointestinal:  Positive for nausea. Negative for abdominal pain, blood in stool, constipation, diarrhea and vomiting.       No longer vomiting   Endocrine: Negative for polydipsia and polyuria.  Genitourinary:  Negative for dysuria, frequency and urgency.  Musculoskeletal:  Negative for arthralgias, back pain and myalgias.  Skin:  Negative for pallor and rash.  Allergic/Immunologic: Negative for environmental allergies.  Neurological:  Positive for dizziness. Negative for tremors, seizures, syncope, facial asymmetry, speech difficulty, weakness, numbness and headaches.       No recent seizures   Hematological:  Negative for adenopathy. Does not bruise/bleed easily.  Psychiatric/Behavioral:  Negative for decreased concentration and dysphoric mood. The patient is not nervous/anxious.        Objective:   Physical Exam Constitutional:  General: She is not in acute distress.    Appearance: Normal appearance. She is well-developed and normal weight. She is not ill-appearing or diaphoretic.  HENT:     Head: Normocephalic and atraumatic.     Right Ear: Tympanic membrane, ear canal and external ear normal. There is no impacted cerumen.      Left Ear: Tympanic membrane, ear canal and external ear normal. There is no impacted cerumen.     Nose:     Comments: Nares are boggy    Mouth/Throat:     Mouth: Mucous membranes are moist.  Eyes:     General:        Right eye: No discharge.        Left eye: No discharge.     Conjunctiva/sclera: Conjunctivae normal.     Pupils: Pupils are equal, round, and reactive to light.     Comments: 1-3 beats of horiz nystagmus noted bilat  Dizzy with lateral gaze both ways  Neck:     Thyroid: No thyromegaly.     Vascular: No carotid bruit or JVD.  Cardiovascular:     Rate and Rhythm: Normal rate. Rhythm irregular.     Heart sounds: Normal heart sounds.     No gallop.  Pulmonary:     Effort: Pulmonary effort is normal. No respiratory distress.     Breath sounds: Normal breath sounds. No stridor. No wheezing, rhonchi or rales.  Abdominal:     General: There is no distension or abdominal bruit.     Palpations: Abdomen is soft. There is no mass.     Tenderness: There is no guarding or rebound.     Comments: Per pt stomach muscles are tender at times   Musculoskeletal:     Cervical back: Normal range of motion and neck supple. No rigidity or tenderness.     Right lower leg: No edema.     Left lower leg: No edema.  Lymphadenopathy:     Cervical: No cervical adenopathy.  Skin:    General: Skin is warm and dry.     Coloration: Skin is not pale.     Findings: No rash.  Neurological:     Mental Status: She is alert and oriented to person, place, and time.     Cranial Nerves: No cranial nerve deficit.     Sensory: No sensory deficit.     Coordination: Coordination normal.     Deep Tendon Reflexes: Reflexes are normal and symmetric. Reflexes normal.     Comments: In wheelchair  Trying to keep her head still  Psychiatric:        Mood and Affect: Mood normal.           Assessment & Plan:   Problem List Items Addressed This Visit       Other   Vertigo - Primary    Pt has had  this before and feels similar  Unfortunately meclizine and prednisone not helping her much Reviewed hospital records, lab results and studies in detail  Reasurring CT and labs Inst pt to avoid rapid pos change or head movements  Will place urgent ref to ENT for this  She also has f/u with neuro on 1/8  (per ER cannot have mri but if needed she thinks she can with her type of pacer)  Will remain hydrated  Meclizine for nausea as needed Strict Er precautions discussed       Relevant Orders   Ambulatory referral to ENT

## 2022-02-24 DIAGNOSIS — M75101 Unspecified rotator cuff tear or rupture of right shoulder, not specified as traumatic: Secondary | ICD-10-CM

## 2022-02-24 HISTORY — DX: Unspecified rotator cuff tear or rupture of right shoulder, not specified as traumatic: M75.101

## 2022-02-27 ENCOUNTER — Ambulatory Visit: Payer: Medicare Other | Attending: Internal Medicine | Admitting: Internal Medicine

## 2022-02-27 ENCOUNTER — Encounter: Payer: Self-pay | Admitting: Internal Medicine

## 2022-02-27 VITALS — BP 94/64 | HR 81 | Ht 68.0 in | Wt 187.0 lb

## 2022-02-27 DIAGNOSIS — J9611 Chronic respiratory failure with hypoxia: Secondary | ICD-10-CM

## 2022-02-27 DIAGNOSIS — I480 Paroxysmal atrial fibrillation: Secondary | ICD-10-CM | POA: Diagnosis not present

## 2022-02-27 DIAGNOSIS — I6381 Other cerebral infarction due to occlusion or stenosis of small artery: Secondary | ICD-10-CM | POA: Diagnosis not present

## 2022-02-27 DIAGNOSIS — I442 Atrioventricular block, complete: Secondary | ICD-10-CM

## 2022-02-27 DIAGNOSIS — I2510 Atherosclerotic heart disease of native coronary artery without angina pectoris: Secondary | ICD-10-CM

## 2022-02-27 DIAGNOSIS — R42 Dizziness and giddiness: Secondary | ICD-10-CM

## 2022-02-27 NOTE — Patient Instructions (Signed)
Medication Instructions:  Your Physician recommend you continue on your current medication as directed.    *If you need a refill on your cardiac medications before your next appointment, please call your pharmacy*   Lab Work: None ordered today   Testing/Procedures: None ordered today   Follow-Up: At [facility], you and your health needs are our priority.  As part of our continuing mission to provide you with exceptional heart care, we have created designated Provider Care Teams.  These Care Teams include your primary Cardiologist (physician) and Advanced Practice Providers (APPs -  Physician Assistants and Nurse Practitioners) who all work together to provide you with the care you need, when you need it.  We recommend signing up for the patient portal called "MyChart".  Sign up information is provided on this After Visit Summary.  MyChart is used to connect with patients for Virtual Visits (Telemedicine).  Patients are able to view lab/test results, encounter notes, upcoming appointments, etc.  Non-urgent messages can be sent to your provider as well.   To learn more about what you can do with MyChart, go to [email or web address].    Your next appointment:   1 month(s)  The format for your next appointment:   In Person  Provider:   You may see Nelva Bush, MD or one of the following Advanced Practice Providers on your designated Care Team:   Murray Hodgkins, NP Christell Faith, PA-C Cadence Kathlen Mody, PA-C Gerrie Nordmann, NP    Dr. Saunders Revel recommends increasing fluid and salt intake. He also recommends checking and keeping a log of your blood pressure daily. Please call the office if you notice your blood pressure is less than 90/60.

## 2022-02-27 NOTE — Progress Notes (Signed)
Follow-up Outpatient Visit Date: 02/27/2022  Primary Care Provider: Abner Greenspan, MD [place] [address]  Chief Complaint: Vertigo  HPI:  Diana Cook is a 72 y.o. female with history of moderate coronary artery disease by cardiac CTA in 11/2018 and catheterization in 06/2019, complete heart block status post permanent pacemaker followed by Dr. Caryl Comes, paroxysmal atrial fibrillation, seizure disorder, COPD, and right breast cancer, who presents for follow-up of coronary artery disease and paroxysmal atrial fibrillation.  She was last seen in our office in June by [place], [place], at which time she was feeling well.  She had experienced some preceding lightheadedness that resolved with treatment for UTI.  Chronic chest wall pain related to prior breast surgery was unchanged from baseline.  No further testing or intervention was recommended.  Dr. Caryl Comes had recommended use of an abdominal binder at their visit earlier in June.  She has used it on occasion but does not feel like it helped very much.  Today, [name] reports that she had been doing fairly well until about 2 weeks ago when she woke up with marked vertigo.  Since then, things have been spinning around her and making it difficult for her to sit up and stand up.  She was evaluated in the ED when the symptoms began, with workup being largely unrevealing.  Head CT did not show any acute abnormalities though a lacunar infarct involving the left basal ganglia was new since the last study in 2021.  Her symptoms are actually somewhat better today, though she still has a little bit of dizziness when sitting or standing for extended periods.  She was evaluated by Dr. Glori Bickers last week, who referred her to ENT and also began meclizine.  [name] has not had any improvement with the meclizine.  She reports that a urinalysis was done in the office (results not available, though Ms. Shuford reports it was normal).  She denies frank dysuria  but is more aware when she voids.  She tries to drink water throughout the day but admits that she is not drinking as much as she should.  She continues to have sporadic pain in her back but no frank anginal chest pain.  Chronic dyspnea is stable.  She denies lower extremity edema and has not needed to use her as needed furosemide.  --------------------------------------------------------------------------------------------------  Past Medical History:  Diagnosis Date   Allergic rhinitis    Arthritis    Asthma    as a child, mild now   Breast cancer ([place]) 12/2015   right breast cancer, lumpectomy and mammosite    Colon polyps    colonoscopy 7/08, tubular adenoma   Complete heart block ([place]) 09/2011   s/p PPM implanted in [facility]   COPD (chronic obstructive pulmonary disease) ([place])    Myocardial infarction ([place]) 2011   Osteopenia 10/2015   Pacemaker    2011   Paroxysmal atrial fibrillation ([place]) 03/2021   Incidentally detected on pacemaker interrogation   Personal history of radiation therapy 2017   right breast ca, mammosite placed   Seizure disorder ([place])    Seizures ([place])    first one was when she was 72 years old    Small bowel obstruction ([place])    1988 and 2002   Tobacco abuse    Past Surgical History:  Procedure Laterality Date   ABDOMINAL HYSTERECTOMY     BREAST BIOPSY Right 2007   benign inflammatory changes, mass due to  underwire bra   BREAST BIOPSY Left 01/02/2016   columnar cell changes without atypical hyperplasia.   BREAST BIOPSY Right 12/06/2015   rt breast mass 10:00, bx done at Dr. Curly [place] office, invasive ductal carcinoma   BREAST EXCISIONAL BIOPSY Left 01/02/2016   COLUMNAR CELL CHANGE AND HYPERPLASIA ASSOCIATED WITH LUMINAL AND STROMAL CALCIFICATIONS   BREAST LUMPECTOMY Right 01/02/2016   invasive mammary carcinoma, clear margins, negative LN   BREAST LUMPECTOMY WITH SENTINEL LYMPH NODE BIOPSY Right 01/02/2016   pT1c, N0; ER/ PR 100%; Her 2 neu  not over expressed: BREAST LUMPECTOMY WITH SENTINEL LYMPH NODE BX;  Surgeon: [name], MD;  Location: [facility] ORS;  Service: General;  Laterality: Right;   CARDIAC CATHETERIZATION     CATARACT EXTRACTION Bilateral    COLONOSCOPY  10/2015   Dr Ardis Hughs   COLONOSCOPY WITH PROPOFOL N/A 08/19/2018   Procedure: COLONOSCOPY WITH PROPOFOL;  Surgeon: Virgel Manifold, MD;  Location: [facility] ENDOSCOPY;  Service: Endoscopy;  Laterality: N/A;   ESOPHAGOGASTRODUODENOSCOPY (EGD) WITH PROPOFOL N/A 08/19/2018   Procedure: ESOPHAGOGASTRODUODENOSCOPY (EGD) WITH PROPOFOL;  Surgeon: Virgel Manifold, MD;  Location: [facility] ENDOSCOPY;  Service: Endoscopy;  Laterality: N/A;   EXPLORATORY LAPAROTOMY  01/25/2001   Exploratory laparotomy, lysis of adhesions, identification of internal hernia secondary to omental adhesion. Prolonged postoperative ileus.   gyn surgery  1993   hysterectomy- form endometriosis   LAPAROSCOPY     PACEMAKER INSERTION  10/24/11   [employer] Advantio dual chamber PPM implanted by Dr Bunnie Philips at [facility] in [place] N/A 12/05/2019   Procedure: [place];  Surgeon: Deboraha Sprang, MD;  Location: [facility];  Service: Cardiovascular;  Laterality: N/A;   RIGHT/LEFT HEART CATH AND CORONARY ANGIOGRAPHY Bilateral 07/19/2019   Procedure: RIGHT/LEFT HEART CATH AND CORONARY ANGIOGRAPHY;  Surgeon: Nelva Bush, MD;  Location: [facility];  Service: Cardiovascular;  Laterality: Bilateral;   TEMPORARY PACEMAKER N/A 12/05/2019   Procedure: TEMPORARY PACEMAKER;  Surgeon: Deboraha Sprang, MD;  Location: [facility];  Service: Cardiovascular;  Laterality: N/A;    Current Meds  Medication Sig   acetaminophen (TYLENOL) 500 MG tablet Take 500-1,000 mg by mouth every 6 (six) hours as needed for mild pain or headache.    albuterol (VENTOLIN HFA) 108 (90 Base) MCG/ACT inhaler Inhale 2 puffs into the lungs every  4 (four) hours as needed for wheezing or shortness of breath.   apixaban (ELIQUIS) 5 MG TABS tablet Take 1 tablet by mouth twice daily   atorvastatin (LIPITOR) 40 MG tablet Take 1 tablet by mouth once daily   benzonatate (TESSALON) 100 MG capsule Take by mouth as needed.   FLUoxetine (PROZAC) 20 MG capsule Take 1 capsule by mouth once daily   furosemide (LASIX) 40 MG tablet Take 1 tablet (40 mg total) by mouth daily as needed (swelling, wt gain or needing more oxygen).   levETIRAcetam (KEPPRA) 250 MG tablet Take 250 mg by mouth 2 (two) times daily.    meclizine (ANTIVERT) 12.5 MG tablet Take 1 tablet (12.5 mg total) by mouth 3 (three) times daily as needed for dizziness.   Multiple Vitamin (MULTIVITAMIN WITH MINERALS) TABS tablet Take 1 tablet by mouth daily.   omeprazole (PRILOSEC) 40 MG capsule Take 40 mg by mouth daily.   OXYGEN Inhale 4 L into the lungs continuous.   Tiotropium Bromide-Olodaterol (STIOLTO RESPIMAT) 2.5-2.5 MCG/ACT AERS INHALE 2 PUFFS BY MOUTH ONCE DAILY  VITAMIN D PO Take 1 tablet by mouth daily.   zolpidem (AMBIEN) 10 MG tablet TAKE 1/2 TO 1 (ONE-HALF TO ONE) TABLET BY MOUTH AT BEDTIME AS NEEDED FOR SLEEP    Allergies: Codeine and Morphine and related  Social History   Tobacco Use   Smoking status: Former    Packs/day: 1.50    Years: 42.00    Total pack years: 63.00    Types: Cigarettes    Quit date: 11/11/2010    Years since quitting: 11.3   Smokeless tobacco: Never  Vaping Use   Vaping Use: Never used  Substance Use Topics   Alcohol use: Yes    Alcohol/week: 1.0 - 2.0 standard drink of alcohol    Types: 1 - 2 Glasses of wine per week    Comment: weekly   Drug use: No    Family History  Problem Relation Age of Onset   Stroke Mother    Heart disease Mother 25       MI and CABG   Dementia Mother    Coronary artery disease Father    Parkinsonism Father    Breast cancer Sister    Cancer - Cervical Daughter 62       died 03/31/2022   Heart attack Brother  72   Colon cancer Neg Hx     Review of Systems: A 12-system review of systems was performed and was negative except as noted in the HPI.  --------------------------------------------------------------------------------------------------  Physical Exam: BP 94/64 (BP Location: Right Arm, Patient Position: Sitting, Cuff Size: Large)   Pulse 81   Ht 5\' 8"  (1.727 m)   Wt 187 lb (84.8 kg)   SpO2 94% Comment: on 4L O2  BMI 28.43 kg/m  Repeat blood pressure: 90/60  General:  NAD. Neck: No JVD or HJR. Lungs: Mildly diminished breath sounds bilaterally without wheezes or crackles. Heart: Regular rate and rhythm without murmurs, rubs, or gallops. Abdomen: Soft, nontender, nondistended. Extremities: No lower extremity edema.  EKG: Normal sinus rhythm with atrially sensed and ventricularly paced rhythm.  Lab Results  Component Value Date   WBC 6.2 02/12/2022   HGB 13.2 02/12/2022   HCT 41.2 02/12/2022   MCV 91.6 02/12/2022   PLT 233 02/12/2022    Lab Results  Component Value Date   NA 135 02/12/2022   K 4.0 02/12/2022   CL 102 02/12/2022   CO2 21 (L) 02/12/2022   BUN 16 02/12/2022   CREATININE 0.79 02/12/2022   GLUCOSE 96 02/12/2022   ALT 17 09/04/2021    Lab Results  Component Value Date   CHOL 123 07/23/2021   HDL 49.40 07/23/2021   LDLCALC 50 07/23/2021   TRIG 116.0 07/23/2021   CHOLHDL 2 07/23/2021    --------------------------------------------------------------------------------------------------  ASSESSMENT AND PLAN: Vertigo: Description of Ms. Pettaway's dizziness is most consistent with vertigo, though her soft blood pressure today in the office also raises the possibility that symptomatic hypotension may be contributing.  She is not on any antihypertensive agents and has not taken her as needed furosemide recently.  She has had dizziness in the past with UTIs, and we discussed repeating a UA to exclude this.  However, she does not wish to do this as she reports  a normal UA at Dr. Alba Cory office last week.  She has follow-up with ENT scheduled for next week.  I have encouraged Ms. Floyd to increase her fluid and sodium intake as well as to consider wearing spanks or an abdominal binder regularly, as she  does not appear to be volume overloaded today.  If her symptoms persist in the setting of soft blood pressure, addition of fludrocortisone or midodrine will need to be considered in the future.  Coronary artery disease: Nonobstructive CAD noted in the past by CTA and cath.  No angina reported.  Continue medical therapy to prevent progression of disease.  Paroxysmal atrial fibrillation: EKG today demonstrates sinus rhythm.  Continue apixaban 5 mg twice daily.  Lacunar infarct: Incidentally noted on recent head CT, new since 2021.  Query if PAF may have contributed.  Continue apixaban and statin therapy.  Complete heart block: Continue follow-up with Dr. Caryl Comes and the device clinic.  Chronic respiratory failure with hypoxia: Stable.  Continue supplemental oxygen and follow-up with pulmonary.  Follow-up: Return to clinic in 1 month.  Nelva Bush, MD 02/27/2022 4:53 PM

## 2022-03-05 ENCOUNTER — Other Ambulatory Visit: Payer: Self-pay | Admitting: *Deleted

## 2022-03-05 ENCOUNTER — Encounter: Payer: Self-pay | Admitting: Radiation Oncology

## 2022-03-05 ENCOUNTER — Ambulatory Visit
Admission: RE | Admit: 2022-03-05 | Discharge: 2022-03-05 | Disposition: A | Discharge status: Home / Self Care | Payer: Medicare Other | Source: Ambulatory Visit | Attending: Radiation Oncology | Admitting: Radiation Oncology

## 2022-03-05 VITALS — BP 102/63 | HR 71 | Temp 97.2°F | Ht 68.0 in | Wt 189.9 lb

## 2022-03-05 DIAGNOSIS — R911 Solitary pulmonary nodule: Secondary | ICD-10-CM

## 2022-03-05 DIAGNOSIS — R918 Other nonspecific abnormal finding of lung field: Secondary | ICD-10-CM | POA: Diagnosis not present

## 2022-03-05 DIAGNOSIS — Z923 Personal history of irradiation: Secondary | ICD-10-CM | POA: Diagnosis not present

## 2022-03-05 NOTE — Progress Notes (Signed)
Radiation Oncology Follow up Note  Name: Diana Cook   Date:   03/05/2022 MRN:  [id number] DOB: 1950/10/28    This 72 y.o. female presents to the clinic today for 1 month follow-up status post SBRT for a probable stage I non-small cell lung cancer of the right lung and patient treated to her right breast over 5 years prior.  REFERRING PROVIDER: Tower, Wynelle Fanny, MD  HPI: Patient is a 72 year old female now out 1 month having completed SBRT to her right lung for presumed probable stage I non-small cell lung cancer seen today in routine follow-up she is doing well.  She has developed some vertigo which is slowly clearing she has been seen by ENT she specifically denies cough hemoptysis chest tightness or any change in her pulmonary status..  COMPLICATIONS OF TREATMENT: none  FOLLOW UP COMPLIANCE: keeps appointments   PHYSICAL EXAM:  BP 102/63   Pulse 71   Temp (!) 97.2 F (36.2 C)   Ht 5\' 8"  (1.727 m)   Wt 189 lb 14.4 oz (86.1 kg)   BMI 28.87 kg/m patient is on nasal oxygen. Well-developed well-nourished patient in NAD. HEENT reveals PERLA, EOMI, discs not visualized.  Oral cavity is clear. No oral mucosal lesions are identified. Neck is clear without evidence of cervical or supraclavicular adenopathy. Lungs are clear to A&P. Cardiac examination is essentially unremarkable with regular rate and rhythm without murmur rub or thrill. Abdomen is benign with no organomegaly or masses noted. Motor sensory and DTR levels are equal and symmetric in the upper and lower extremities. Cranial nerves II through XII are grossly intact. Proprioception is intact. No peripheral adenopathy or edema is identified. No motor or sensory levels are noted. Crude visual fields are within normal range.  RADIOLOGY RESULTS: CT scan of the chest ordered  PLAN: At the present time patient is doing well very low side effect profile from SBRT.  I am pleased with her overall progress.  I have asked to see her back in 3  months with a CT scan of her chest prior to that visit.  Patient is to call with any concerns.  I would like to take this opportunity to thank you for allowing me to participate in the care of your patient.Noreene Filbert, MD

## 2022-03-06 ENCOUNTER — Ambulatory Visit (INDEPENDENT_AMBULATORY_CARE_PROVIDER_SITE_OTHER): Payer: Medicare Other

## 2022-03-06 DIAGNOSIS — I442 Atrioventricular block, complete: Secondary | ICD-10-CM

## 2022-03-06 LAB — CUP PACEART REMOTE DEVICE CHECK
Battery Remaining Longevity: 144 mo
Battery Remaining Percentage: 100 %
Brady Statistic RA Percent Paced: 3 %
Brady Statistic RV Percent Paced: 100 %
Date Time Interrogation Session: 20240111011100
Implantable Lead Connection Status: 753985
Implantable Lead Connection Status: 753985
Implantable Lead Implant Date: 20130830
Implantable Lead Implant Date: 20130830
Implantable Lead Location: 753859
Implantable Lead Location: 753860
Implantable Lead Model: 4456
Implantable Lead Model: 4479
Implantable Lead Serial Number: 473325
Implantable Lead Serial Number: 523784
Implantable Pulse Generator Implant Date: 20211011
Lead Channel Impedance Value: 396 Ohm
Lead Channel Impedance Value: 598 Ohm
Lead Channel Pacing Threshold Amplitude: 0.4 V
Lead Channel Pacing Threshold Pulse Width: 0.4 ms
Lead Channel Setting Pacing Amplitude: 2 V
Lead Channel Setting Pacing Amplitude: 2 V
Lead Channel Setting Pacing Pulse Width: 0.4 ms
Lead Channel Setting Sensing Sensitivity: 2.5 mV
Pulse Gen Serial Number: 949303
Zone Setting Status: 755011

## 2022-03-12 ENCOUNTER — Ambulatory Visit (INDEPENDENT_AMBULATORY_CARE_PROVIDER_SITE_OTHER): Payer: Medicare Other

## 2022-03-12 VITALS — Wt 185.0 lb

## 2022-03-12 DIAGNOSIS — Z0000 Encounter for general adult medical examination without abnormal findings: Secondary | ICD-10-CM

## 2022-03-12 NOTE — Patient Instructions (Signed)
Diana Cook , Thank you for taking time to come for your Medicare Wellness Visit. I appreciate your ongoing commitment to your health goals. Please review the following plan we discussed and let me know if I can assist you in the future.   These are the goals we discussed:  Goals      Patient Stated     Starting 12/08/2017, I will continue to take medications as prescribed.      Patient Stated     12/28/2018, I will try to start losing some weight in the future.     Patient Stated     03/09/2020, I will maintain and continue medications as prescribed.     Patient Stated     Would like to exercise and eat less.     Patient Stated     Working with PT for balance         This is a list of the screening recommended for you and due dates:  Health Maintenance  Topic Date Due   Zoster (Shingles) Vaccine (1 of 2) Never done   DTaP/Tdap/Td vaccine (3 - Tdap) 06/13/2017   COVID-19 Vaccine (5 - 2023-24 season) 10/25/2021   Flu Shot  05/25/2022*   Colon Cancer Screening  03/13/2023*   Screening for Lung Cancer  12/11/2022   Mammogram  01/02/2023   Medicare Annual Wellness Visit  03/13/2023   Pneumonia Vaccine  Completed   DEXA scan (bone density measurement)  Completed   Hepatitis C Screening: USPSTF Recommendation to screen - Ages 64-79 yo.  Completed   HPV Vaccine  Aged Out  *Topic was postponed. The date shown is not the original due date.    Advanced directives: Please bring a copy of your health care power of attorney and living will to the office at your convenience.   Conditions/risks identified: continue working with PT   Next appointment: Follow up in one year for your annual wellness visit    Preventive Care 65 Years and Older, Female Preventive care refers to lifestyle choices and visits with your health care provider that can promote health and wellness. What does preventive care include? A yearly physical exam. This is also called an annual well check. Dental exams  once or twice a year. Routine eye exams. Ask your health care provider how often you should have your eyes checked. Personal lifestyle choices, including: Daily care of your teeth and gums. Regular physical activity. Eating a healthy diet. Avoiding tobacco and drug use. Limiting alcohol use. Practicing safe sex. Taking low-dose aspirin every day. Taking vitamin and mineral supplements as recommended by your health care provider. What happens during an annual well check? The services and screenings done by your health care provider during your annual well check will depend on your age, overall health, lifestyle risk factors, and family history of disease. Counseling  Your health care provider may ask you questions about your: Alcohol use. Tobacco use. Drug use. Emotional well-being. Home and relationship well-being. Sexual activity. Eating habits. History of falls. Memory and ability to understand (cognition). Work and work Astronomer. Reproductive health. Screening  You may have the following tests or measurements: Height, weight, and BMI. Blood pressure. Lipid and cholesterol levels. These may be checked every 5 years, or more frequently if you are over 64 years old. Skin check. Lung cancer screening. You may have this screening every year starting at age 80 if you have a 30-pack-year history of smoking and currently smoke or have quit within the past  15 years. Fecal occult blood test (FOBT) of the stool. You may have this test every year starting at age 32. Flexible sigmoidoscopy or colonoscopy. You may have a sigmoidoscopy every 5 years or a colonoscopy every 10 years starting at age 76. Hepatitis C blood test. Hepatitis B blood test. Sexually transmitted disease (STD) testing. Diabetes screening. This is done by checking your blood sugar (glucose) after you have not eaten for a while (fasting). You may have this done every 1-3 years. Bone density scan. This is done to  screen for osteoporosis. You may have this done starting at age 47. Mammogram. This may be done every 1-2 years. Talk to your health care provider about how often you should have regular mammograms. Talk with your health care provider about your test results, treatment options, and if necessary, the need for more tests. Vaccines  Your health care provider may recommend certain vaccines, such as: Influenza vaccine. This is recommended every year. Tetanus, diphtheria, and acellular pertussis (Tdap, Td) vaccine. You may need a Td booster every 10 years. Zoster vaccine. You may need this after age 87. Pneumococcal 13-valent conjugate (PCV13) vaccine. One dose is recommended after age 59. Pneumococcal polysaccharide (PPSV23) vaccine. One dose is recommended after age 68. Talk to your health care provider about which screenings and vaccines you need and how often you need them. This information is not intended to replace advice given to you by your health care provider. Make sure you discuss any questions you have with your health care provider. Document Released: 03/09/2015 Document Revised: 10/31/2015 Document Reviewed: 12/12/2014 Elsevier Interactive Patient Education  2017 [employer].  Fall Prevention in the Home Falls can cause injuries. They can happen to people of all ages. There are many things you can do to make your home safe and to help prevent falls. What can I do on the outside of my home? Regularly fix the edges of walkways and driveways and fix any cracks. Remove anything that might make you trip as you walk through a door, such as a raised step or threshold. Trim any bushes or trees on the path to your home. Use bright outdoor lighting. Clear any walking paths of anything that might make someone trip, such as rocks or tools. Regularly check to see if handrails are loose or broken. Make sure that both sides of any steps have handrails. Any raised decks and porches should have  guardrails on the edges. Have any leaves, snow, or ice cleared regularly. Use sand or salt on walking paths during winter. Clean up any spills in your garage right away. This includes oil or grease spills. What can I do in the bathroom? Use night lights. Install grab bars by the toilet and in the tub and shower. Do not use towel bars as grab bars. Use non-skid mats or decals in the tub or shower. If you need to sit down in the shower, use a plastic, non-slip stool. Keep the floor dry. Clean up any water that spills on the floor as soon as it happens. Remove soap buildup in the tub or shower regularly. Attach bath mats securely with double-sided non-slip rug tape. Do not have throw rugs and other things on the floor that can make you trip. What can I do in the bedroom? Use night lights. Make sure that you have a light by your bed that is easy to reach. Do not use any sheets or blankets that are too big for your bed. They should not hang  down onto the floor. Have a firm chair that has side arms. You can use this for support while you get dressed. Do not have throw rugs and other things on the floor that can make you trip. What can I do in the kitchen? Clean up any spills right away. Avoid walking on wet floors. Keep items that you use a lot in easy-to-reach places. If you need to reach something above you, use a strong step stool that has a grab bar. Keep electrical cords out of the way. Do not use floor polish or wax that makes floors slippery. If you must use wax, use non-skid floor wax. Do not have throw rugs and other things on the floor that can make you trip. What can I do with my stairs? Do not leave any items on the stairs. Make sure that there are handrails on both sides of the stairs and use them. Fix handrails that are broken or loose. Make sure that handrails are as long as the stairways. Check any carpeting to make sure that it is firmly attached to the stairs. Fix any carpet  that is loose or worn. Avoid having throw rugs at the top or bottom of the stairs. If you do have throw rugs, attach them to the floor with carpet tape. Make sure that you have a light switch at the top of the stairs and the bottom of the stairs. If you do not have them, ask someone to add them for you. What else can I do to help prevent falls? Wear shoes that: Do not have high heels. Have rubber bottoms. Are comfortable and fit you well. Are closed at the toe. Do not wear sandals. If you use a stepladder: Make sure that it is fully opened. Do not climb a closed stepladder. Make sure that both sides of the stepladder are locked into place. Ask someone to hold it for you, if possible. Clearly mark and make sure that you can see: Any grab bars or handrails. First and last steps. Where the edge of each step is. Use tools that help you move around (mobility aids) if they are needed. These include: Canes. Walkers. Scooters. Crutches. Turn on the lights when you go into a dark area. Replace any light bulbs as soon as they burn out. Set up your furniture so you have a clear path. Avoid moving your furniture around. If any of your floors are uneven, fix them. If there are any pets around you, be aware of where they are. Review your medicines with your doctor. Some medicines can make you feel dizzy. This can increase your chance of falling. Ask your doctor what other things that you can do to help prevent falls. This information is not intended to replace advice given to you by your health care provider. Make sure you discuss any questions you have with your health care provider. Document Released: 12/07/2008 Document Revised: 07/19/2015 Document Reviewed: 03/17/2014 Elsevier Interactive Patient Education  2017 [employer].

## 2022-03-12 NOTE — Progress Notes (Signed)
I connected with  Diana Cook on 03/12/22 by a audio enabled telemedicine application and verified that I am speaking with the correct person using two identifiers.  Patient Location: Home  Provider Location: Home Office  I discussed the limitations of evaluation and management by telemedicine. The patient expressed understanding and agreed to proceed.   Subjective:   Diana Cook is a 72 y.o. female who presents for Medicare Annual (Subsequent) preventive examination.  Review of Systems     Cardiac Risk Factors include: advanced age (>18men, >70 women);dyslipidemia     Objective:    Today's Vitals   03/12/22 1302  Weight: 185 lb (83.9 kg)   Body mass index is 28.13 kg/m.     03/12/2022    1:06 PM 03/05/2022    2:37 PM 02/12/2022    1:20 PM 12/24/2021   11:05 AM 12/20/2021   12:52 PM 09/05/2021    1:11 PM 09/04/2021    8:45 PM  Advanced Directives  Does Patient Have a Medical Advance Directive? Yes Yes Yes No No Yes Yes  Type of Estate agent of [place];Living will  Healthcare Power of [place];Living will   Living will Living will;Healthcare Power of Attorney  Does patient want to make changes to medical advance directive?  No - Patient declined  No - Patient declined  No - Guardian declined   Copy of Healthcare Power of Attorney in Chart? No - copy requested        Would patient like information on creating a medical advance directive?    No - Patient declined No - Patient declined      Current Medications (verified) Outpatient Encounter Medications as of 03/12/2022  Medication Sig   acetaminophen (TYLENOL) 500 MG tablet Take 500-1,000 mg by mouth every 6 (six) hours as needed for mild pain or headache.    albuterol (VENTOLIN HFA) 108 (90 Base) MCG/ACT inhaler Inhale 2 puffs into the lungs every 4 (four) hours as needed for wheezing or shortness of breath.   apixaban (ELIQUIS) 5 MG TABS tablet Take 1 tablet by mouth twice daily   atorvastatin  (LIPITOR) 40 MG tablet Take 1 tablet by mouth once daily   benzonatate (TESSALON) 100 MG capsule Take by mouth as needed.   FLUoxetine (PROZAC) 20 MG capsule Take 1 capsule by mouth once daily   furosemide (LASIX) 40 MG tablet Take 1 tablet (40 mg total) by mouth daily as needed (swelling, wt gain or needing more oxygen).   levETIRAcetam (KEPPRA) 250 MG tablet Take 250 mg by mouth 2 (two) times daily.    omeprazole (PRILOSEC) 40 MG capsule Take 40 mg by mouth daily.   OXYGEN Inhale 4 L into the lungs continuous.   Tiotropium Bromide-Olodaterol (STIOLTO RESPIMAT) 2.5-2.5 MCG/ACT AERS INHALE 2 PUFFS BY MOUTH ONCE DAILY   VITAMIN D PO Take 1 tablet by mouth daily.   zolpidem (AMBIEN) 10 MG tablet TAKE 1/2 TO 1 (ONE-HALF TO ONE) TABLET BY MOUTH AT BEDTIME AS NEEDED FOR SLEEP   meclizine (ANTIVERT) 12.5 MG tablet Take 1 tablet (12.5 mg total) by mouth 3 (three) times daily as needed for dizziness. (Patient not taking: Reported on 03/12/2022)   [DISCONTINUED] Multiple Vitamin (MULTIVITAMIN WITH MINERALS) TABS tablet Take 1 tablet by mouth daily.   No facility-administered encounter medications on file as of 03/12/2022.    Allergies (verified) Codeine and Morphine and related   History: Past Medical History:  Diagnosis Date   Allergic rhinitis    Arthritis  Asthma    as a child, mild now   Breast cancer (HCC) 12/2015   right breast cancer, lumpectomy and mammosite    Colon polyps    colonoscopy 7/08, tubular adenoma   Complete heart block (HCC) 09/2011   s/p PPM implanted in [facility]   COPD (chronic obstructive pulmonary disease) (HCC)    Myocardial infarction (HCC) 2011   Osteopenia 10/2015   Pacemaker    2011   Paroxysmal atrial fibrillation (HCC) 03/2021   Incidentally detected on pacemaker interrogation   Personal history of radiation therapy 2017   right breast ca, mammosite placed   Seizure disorder (HCC)    Seizures (HCC)    first one was when she was 72 years old     Small bowel obstruction (HCC)    1988 and 2002   Tobacco abuse    Past Surgical History:  Procedure Laterality Date   ABDOMINAL HYSTERECTOMY     BREAST BIOPSY Right 2007   benign inflammatory changes, mass due to underwire bra   BREAST BIOPSY Left 01/02/2016   columnar cell changes without atypical hyperplasia.   BREAST BIOPSY Right 12/06/2015   rt breast mass 10:00, bx done at Dr. Arvella Nigh office, invasive ductal carcinoma   BREAST EXCISIONAL BIOPSY Left 01/02/2016   COLUMNAR CELL CHANGE AND HYPERPLASIA ASSOCIATED WITH LUMINAL AND STROMAL CALCIFICATIONS   BREAST LUMPECTOMY Right 01/02/2016   invasive mammary carcinoma, clear margins, negative LN   BREAST LUMPECTOMY WITH SENTINEL LYMPH NODE BIOPSY Right 01/02/2016   pT1c, N0; ER/ PR 100%; Her 2 neu not over expressed: BREAST LUMPECTOMY WITH SENTINEL LYMPH NODE BX;  Surgeon: Earline Mayotte, MD;  Location: [facility] ORS;  Service: General;  Laterality: Right;   CARDIAC CATHETERIZATION     CATARACT EXTRACTION Bilateral    COLONOSCOPY  10/2015   Dr Christella Hartigan   COLONOSCOPY WITH PROPOFOL N/A 08/19/2018   Procedure: COLONOSCOPY WITH PROPOFOL;  Surgeon: Pasty Spillers, MD;  Location: [facility] ENDOSCOPY;  Service: Endoscopy;  Laterality: N/A;   ESOPHAGOGASTRODUODENOSCOPY (EGD) WITH PROPOFOL N/A 08/19/2018   Procedure: ESOPHAGOGASTRODUODENOSCOPY (EGD) WITH PROPOFOL;  Surgeon: Pasty Spillers, MD;  Location: [facility] ENDOSCOPY;  Service: Endoscopy;  Laterality: N/A;   EXPLORATORY LAPAROTOMY  01/25/2001   Exploratory laparotomy, lysis of adhesions, identification of internal hernia secondary to omental adhesion. Prolonged postoperative ileus.   gyn surgery  1993   hysterectomy- form endometriosis   LAPAROSCOPY     PACEMAKER INSERTION  10/24/11   [employer] Advantio dual chamber PPM implanted by Dr Venda Rodes at [facility] in [place]   PPM GENERATOR CHANGEOUT N/A 12/05/2019   Procedure: PPM GENERATOR Janeann Merl;  Surgeon:  Duke Salvia, MD;  Location: [facility] INVASIVE CV LAB;  Service: Cardiovascular;  Laterality: N/A;   RIGHT/LEFT HEART CATH AND CORONARY ANGIOGRAPHY Bilateral 07/19/2019   Procedure: RIGHT/LEFT HEART CATH AND CORONARY ANGIOGRAPHY;  Surgeon: Yvonne Kendall, MD;  Location: [facility] INVASIVE CV LAB;  Service: Cardiovascular;  Laterality: Bilateral;   TEMPORARY PACEMAKER N/A 12/05/2019   Procedure: TEMPORARY PACEMAKER;  Surgeon: Duke Salvia, MD;  Location: [facility] INVASIVE CV LAB;  Service: Cardiovascular;  Laterality: N/A;   Family History  Problem Relation Age of Onset   Stroke Mother    Heart disease Mother 68       MI and CABG   Dementia Mother    Coronary artery disease Father    Parkinsonism Father    Breast cancer Sister    Cancer - Cervical Daughter 36  died 1/18   Heart attack Brother 46   Colon cancer Neg Hx    Social History   Socioeconomic History   Marital status: Married    Spouse name: Not on file   Number of children: Not on file   Years of education: Not on file   Highest education level: Not on file  Occupational History    Employer: RETIRED  Tobacco Use   Smoking status: Former    Packs/day: 1.50    Years: 42.00    Total pack years: 63.00    Types: Cigarettes    Quit date: 11/11/2010    Years since quitting: 11.3   Smokeless tobacco: Never  Vaping Use   Vaping Use: Never used  Substance and Sexual Activity   Alcohol use: Yes    Alcohol/week: 1.0 - 2.0 standard drink of alcohol    Types: 1 - 2 Glasses of wine per week    Comment: weekly   Drug use: No   Sexual activity: Never  Other Topics Concern   Not on file  Social History Narrative   Married 40 years.   Exercises, does walking tapes   Social Determinants of Health   Financial Resource Strain: Low Risk  (03/12/2022)   Overall Financial Resource Strain (CARDIA)    Difficulty of Paying Living Expenses: Not hard at all  Food Insecurity: No Food Insecurity (03/12/2022)   Hunger Vital Sign     Worried About Running Out of Food in the Last Year: Never true    Ran Out of Food in the Last Year: Never true  Transportation Needs: No Transportation Needs (03/12/2022)   PRAPARE - Hydrologist (Medical): No    Lack of Transportation (Non-Medical): No  Physical Activity: Insufficiently Active (03/12/2022)   Exercise Vital Sign    Days of Exercise per Week: 2 days    Minutes of Exercise per Session: 50 min  Stress: No Stress Concern Present (03/12/2022)   [place]    Feeling of Stress : Not at all  Social Connections: [place] (03/12/2022)   Social Connection and Isolation Panel [NHANES]    Frequency of Communication with Friends and Family: More than three times a week    Frequency of Social Gatherings with Friends and Family: More than three times a week    Attends Religious Services: More than 4 times per year    Active Member of [employer] or Organizations: Yes    Attends Archivist Meetings: 1 to 4 times per year    Marital Status: Married    Tobacco Counseling Counseling given: Not Answered   Clinical Intake:  Pre-visit preparation completed: Yes  Pain : No/denies pain     BMI - recorded: 28.13 Nutritional Status: BMI 25 -29 Overweight Nutritional Risks: None Diabetes: No  How often do you need to have someone help you when you read instructions, pamphlets, or other written materials from your doctor or pharmacy?: 1 - Never  Diabetic?no  Interpreter Needed?: No  Information entered by :: Charlott Rakes, LPN   Activities of Daily Living    03/12/2022    1:09 PM 09/05/2021    1:11 PM  In your present state of health, do you have any difficulty performing the following activities:  Hearing? 1 0  Comment has an appt to test   Vision? 0 0  Difficulty concentrating or making decisions? 0 0  Walking or climbing stairs? 0 1  Dressing or bathing? 0 0   Doing errands, shopping? 0 0  Preparing Food and eating ? N   Using the Toilet? N   In the past six months, have you accidently leaked urine? N   Do you have problems with loss of bowel control? N   Managing your Medications? N   Managing your Finances? N   Housekeeping or managing your Housekeeping? N     Patient Care Team: Tower, Audrie Gallus, MD as PCP - General (Family Medicine) End, Cristal Deer, MD as PCP - Cardiology (Cardiology) Duke Salvia, MD as Consulting Physician (Cardiology) Irene Limbo., MD as Referring Physician (Ophthalmology) Tower, Audrie Gallus, MD as Consulting Physician (Family Medicine) Lemar Livings Merrily Pew, MD (General Surgery)  Indicate any recent Medical Services you may have received from other than [facility] providers in the past year (date may be approximate).     Assessment:   This is a routine wellness examination for Diana Cook.  Hearing/Vision screen Hearing Screening - Comments:: Pt has appt for hearing test  Vision Screening - Comments:: Pt follows with Dr Alvester Morin for annual eye exams   Dietary issues and exercise activities discussed: Current Exercise Habits: Structured exercise class, Type of exercise: Other - see comments (PT), Time (Minutes): 50, Frequency (Times/Week): 2, Weekly Exercise (Minutes/Week): 100   Goals Addressed             This Visit's Progress    Patient Stated       Working with PT for balance        Depression Screen    03/12/2022    1:08 PM 09/17/2021    2:30 PM 07/29/2021    8:09 AM 05/16/2021    4:43 PM 03/11/2021   12:07 PM 03/09/2020   12:07 PM 12/28/2018   12:14 PM  PHQ 2/9 Scores  PHQ - 2 Score 0 0 0 2 0 0 0  PHQ- 9 Score    11  0 0    Fall Risk    03/12/2022    1:07 PM 09/17/2021    2:29 PM 07/29/2021    8:09 AM 05/08/2021    2:10 PM 03/11/2021   12:05 PM  Fall Risk   Falls in the past year? 1 1 0 0 0  Number falls in past yr: 1 0   0  Injury with Fall? 1 0   0  Comment tore rotator cuff right arm      Risk for  fall due to : History of fall(s);Impaired balance/gait;Impaired mobility;Impaired vision      Follow up Falls prevention discussed Falls evaluation completed Falls evaluation completed  Falls prevention discussed    FALL RISK PREVENTION PERTAINING TO THE HOME:  Any stairs in or around the home? No  If so, are there any without handrails? No  Home free of loose throw rugs in walkways, pet beds, electrical cords, etc? Yes  Adequate lighting in your home to reduce risk of falls? Yes   ASSISTIVE DEVICES UTILIZED TO PREVENT FALLS:  Life alert? No  Use of a cane, walker or w/c? Yes  Grab bars in the bathroom? Yes  Shower chair or bench in shower? Yes  Elevated toilet seat or a handicapped toilet? Yes   TIMED UP AND GO:  Was the test performed? No .   Cognitive Function:    03/09/2020   12:09 PM 12/28/2018   12:20 PM 12/08/2017   12:54 PM 09/30/2016    1:00 PM 07/26/2015    1:38 PM  MMSE - Mini Mental State Exam  Orientation to time 5 5 5 5 5   Orientation to Place 5 5 5 5 5   Registration 3 3 3 3 3   Attention/ Calculation 5 5 0 0 0  Recall 3 3 3 3 2   Recall-comments     pt was unable to recall 1 of 3 words  Language- name 2 objects   0 0 0  Language- repeat 1 1 1 1 1   Language- follow 3 step command   3 3 3   Language- read & follow direction   0 0 0  Write a sentence   0 0 0  Copy design   0 0 0  Total score   20 20 19         03/12/2022    1:11 PM  6CIT Screen  What Year? 0 points  What month? 0 points  What time? 0 points  Count back from 20 0 points  Months in reverse 4 points  Repeat phrase 0 points  Total Score 4 points    Immunizations Immunization History  Administered Date(s) Administered   Fluad Quad(high Dose 65+) 10/24/2020   Influenza Whole 02/24/2005   Influenza, High Dose Seasonal PF 11/04/2018, 11/22/2019   Influenza,inj,Quad PF,6+ Mos 12/03/2012, 12/09/2013, 01/16/2015, 02/06/2016, 11/04/2016, 12/08/2017   PFIZER Comirnaty(Gray Top)Covid-19  Tri-Sucrose Vaccine 07/06/2020   PFIZER(Purple Top)SARS-COV-2 Vaccination 02/28/2019, 03/21/2019, 11/14/2019   Pneumococcal Conjugate-13 07/11/2014   Pneumococcal Polysaccharide-23 03/21/2005, 07/26/2015   Td 01/09/1999, 06/14/2007   Zoster, Live 09/07/2015    TDAP status: Due, Education has been provided regarding the importance of this vaccine. Advised may receive this vaccine at local pharmacy or Health Dept. Aware to provide a copy of the vaccination record if obtained from local pharmacy or Health Dept. Verbalized acceptance and understanding.  Flu Vaccine status: Due, Education has been provided regarding the importance of this vaccine. Advised may receive this vaccine at local pharmacy or Health Dept. Aware to provide a copy of the vaccination record if obtained from local pharmacy or Health Dept. Verbalized acceptance and understanding.  Pneumococcal vaccine status: Up to date  Covid-19 vaccine status: Completed vaccines  Qualifies for Shingles Vaccine? Yes   Zostavax completed No   Shingrix Completed?: No.    Education has been provided regarding the importance of this vaccine. Patient has been advised to call insurance company to determine out of pocket expense if they have not yet received this vaccine. Advised may also receive vaccine at local pharmacy or Health Dept. Verbalized acceptance and understanding.  Screening Tests Health Maintenance  Topic Date Due   Zoster Vaccines- Shingrix (1 of 2) Never done   DTaP/Tdap/Td (3 - Tdap) 06/13/2017   COVID-19 Vaccine (5 - 2023-24 season) 10/25/2021   INFLUENZA VACCINE  05/25/2022 (Originally 09/24/2021)   COLONOSCOPY (Pts 45-58yrs Insurance coverage will need to be confirmed)  03/13/2023 (Originally 08/18/2021)   Lung Cancer Screening  12/11/2022   MAMMOGRAM  01/02/2023   Medicare Annual Wellness (AWV)  03/13/2023   Pneumonia Vaccine 62+ Years old  Completed   DEXA SCAN  Completed   Hepatitis C Screening  Completed   HPV VACCINES   Aged Out    Health Maintenance  Health Maintenance Due  Topic Date Due   Zoster Vaccines- Shingrix (1 of 2) Never done   DTaP/Tdap/Td (3 - Tdap) 06/13/2017   COVID-19 Vaccine (5 - 2023-24 season) 10/25/2021    Colorectal cancer screening: Type of screening: Colonoscopy. Completed 08/19/18. Repeat every 3 years pt declined  at this time   Mammogram status: Completed 01/01/22. Repeat every year  Bone Density status: Completed 01/26/18. Results reflect: Bone density results: OSTEOPOROSIS. Repeat every 2 years.   Additional Screening:  Hepatitis C Screening:  Completed 09/30/16  Vision Screening: Recommended annual ophthalmology exams for early detection of glaucoma and other disorders of the eye. Is the patient up to date with their annual eye exam?  Yes  Who is the provider or what is the name of the office in which the patient attends annual eye exams? Dr Alvester Morin If pt is not established with a provider, would they like to be referred to a provider to establish care? No .   Dental Screening: Recommended annual dental exams for proper oral hygiene  Community Resource Referral / Chronic Care Management: CRR required this visit?  No   CCM required this visit?  No      Plan:     I have personally reviewed and noted the following in the patient's chart:   Medical and social history Use of alcohol, tobacco or illicit drugs  Current medications and supplements including opioid prescriptions. Patient is not currently taking opioid prescriptions. Functional ability and status Nutritional status Physical activity Advanced directives List of other physicians Hospitalizations, surgeries, and ER visits in previous 12 months Vitals Screenings to include cognitive, depression, and falls Referrals and appointments  In addition, I have reviewed and discussed with patient certain preventive protocols, quality metrics, and best practice recommendations. A written personalized care plan for  preventive services as well as general preventive health recommendations were provided to patient.     Marzella Schlein, LPN   [id number]/47/[id number]   Nurse Notes: none

## 2022-03-23 DIAGNOSIS — I6529 Occlusion and stenosis of unspecified carotid artery: Secondary | ICD-10-CM | POA: Insufficient documentation

## 2022-03-23 NOTE — Progress Notes (Unsigned)
MRN : [id number]  Diana Cook is a 72 y.o. (25-Apr-1950) female who presents with chief complaint of check carotid arteries.  History of Present Illness:   The patient is seen for evaluation of carotid stenosis. The carotid stenosis was identified after she experienced several episodes of vertigo associated with the room spinning as well as nausea vomiting that has led to falls.  A carotid duplex was obtained at [facility] which demonstrated critical stenosis.  The patient denies amaurosis fugax. There is no recent history of TIA symptoms or focal motor deficits. There is no prior documented CVA.  No history of rest pain symptoms. No new ulcers or wounds of the lower extremities have occurred.  There is no history of DVT, PE or superficial thrombophlebitis. No recent episodes of angina or shortness of breath documented.   No outpatient medications have been marked as taking for the 03/24/22 encounter (Appointment) with Delana Meyer, Dolores Lory, MD.    Past Medical History:  Diagnosis Date   Allergic rhinitis    Arthritis    Asthma    as a child, mild now   Breast cancer ([place]) 12/2015   right breast cancer, lumpectomy and mammosite    Colon polyps    colonoscopy 7/08, tubular adenoma   Complete heart block ([place]) 09/2011   s/p PPM implanted in [facility]   COPD (chronic obstructive pulmonary disease) ([place])    Myocardial infarction ([place]) 2011   Osteopenia 10/2015   Pacemaker    2011   Paroxysmal atrial fibrillation ([place]) 03/2021   Incidentally detected on pacemaker interrogation   Personal history of radiation therapy 2017   right breast ca, mammosite placed   Seizure disorder ([place])    Seizures ([place])    first one was when she was 72 years old    Small bowel obstruction ([place])    1988 and 2002   Tobacco abuse     Past Surgical History:  Procedure Laterality Date   ABDOMINAL HYSTERECTOMY     BREAST BIOPSY Right 2007   benign inflammatory changes, mass due to  underwire bra   BREAST BIOPSY Left 01/02/2016   columnar cell changes without atypical hyperplasia.   BREAST BIOPSY Right 12/06/2015   rt breast mass 10:00, bx done at Dr. Curly [place] office, invasive ductal carcinoma   BREAST EXCISIONAL BIOPSY Left 01/02/2016   COLUMNAR CELL CHANGE AND HYPERPLASIA ASSOCIATED WITH LUMINAL AND STROMAL CALCIFICATIONS   BREAST LUMPECTOMY Right 01/02/2016   invasive mammary carcinoma, clear margins, negative LN   BREAST LUMPECTOMY WITH SENTINEL LYMPH NODE BIOPSY Right 01/02/2016   pT1c, N0; ER/ PR 100%; Her 2 neu not over expressed: BREAST LUMPECTOMY WITH SENTINEL LYMPH NODE BX;  Surgeon: [name], MD;  Location: [facility] ORS;  Service: General;  Laterality: Right;   CARDIAC CATHETERIZATION     CATARACT EXTRACTION Bilateral    COLONOSCOPY  10/2015   Dr Ardis Hughs   COLONOSCOPY WITH PROPOFOL N/A 08/19/2018   Procedure: COLONOSCOPY WITH PROPOFOL;  Surgeon: Virgel Manifold, MD;  Location: [facility] ENDOSCOPY;  Service: Endoscopy;  Laterality: N/A;   ESOPHAGOGASTRODUODENOSCOPY (EGD) WITH PROPOFOL N/A 08/19/2018   Procedure: ESOPHAGOGASTRODUODENOSCOPY (EGD) WITH PROPOFOL;  Surgeon: Virgel Manifold, MD;  Location: [facility] ENDOSCOPY;  Service: Endoscopy;  Laterality: N/A;   EXPLORATORY LAPAROTOMY  01/25/2001   Exploratory laparotomy, lysis of adhesions, identification of internal hernia secondary to omental adhesion. Prolonged postoperative ileus.   gyn surgery  1993   hysterectomy- form endometriosis   LAPAROSCOPY  PACEMAKER INSERTION  10/24/11   [employer] Advantio dual chamber PPM implanted by Dr Bunnie Philips at [facility] in [place] N/A 12/05/2019   Procedure: [place];  Surgeon: Deboraha Sprang, MD;  Location: [place] [facility];  Service: Cardiovascular;  Laterality: N/A;   RIGHT/LEFT HEART CATH AND CORONARY ANGIOGRAPHY Bilateral 07/19/2019   Procedure: RIGHT/LEFT HEART CATH AND CORONARY  ANGIOGRAPHY;  Surgeon: Nelva Bush, MD;  Location: [place] [facility];  Service: Cardiovascular;  Laterality: Bilateral;   TEMPORARY PACEMAKER N/A 12/05/2019   Procedure: TEMPORARY PACEMAKER;  Surgeon: Deboraha Sprang, MD;  Location: [place] [facility];  Service: Cardiovascular;  Laterality: N/A;    Social History Social History   Tobacco Use   Smoking status: Former    Packs/day: 1.50    Years: 42.00    Total pack years: 63.00    Types: Cigarettes    Quit date: 11/11/2010    Years since quitting: 11.3   Smokeless tobacco: Never  Vaping Use   Vaping Use: Never used  Substance Use Topics   Alcohol use: Yes    Alcohol/week: 1.0 - 2.0 standard drink of alcohol    Types: 1 - 2 Glasses of wine per week    Comment: weekly   Drug use: No    Family History Family History  Problem Relation Age of Onset   Stroke Mother    Heart disease Mother 70       MI and CABG   Dementia Mother    Coronary artery disease Father    Parkinsonism Father    Breast cancer Sister    Cancer - Cervical Daughter 40       died 14-Mar-2022   Heart attack Brother 65   Colon cancer Neg Hx     Allergies  Allergen Reactions   Codeine Nausea And Vomiting   Morphine And Related Nausea Only     REVIEW OF SYSTEMS (Negative unless checked)  Constitutional: [] Weight loss  [] Fever  [] Chills Cardiac: [] Chest pain   [] Chest pressure   [] Palpitations   [] Shortness of breath when laying flat   [] Shortness of breath with exertion. Vascular:  [x] Pain in legs with walking   [] Pain in legs at rest  [] History of DVT   [] Phlebitis   [] Swelling in legs   [] Varicose veins   [] Non-healing ulcers Pulmonary:   [x] Uses home oxygen   [] Productive cough   [] Hemoptysis   [] Wheeze  [x] COPD   [] Asthma Neurologic:  [] Dizziness   [] Seizures   [] History of stroke   [] History of TIA  [] Aphasia   [] Vissual changes   [] Weakness or numbness in arm   [x] Weakness or numbness in leg Musculoskeletal:   [] Joint swelling   [] Joint pain    [] Low back pain Hematologic:  [] Easy bruising  [] Easy bleeding   [] Hypercoagulable state   [] Anemic Gastrointestinal:  [] Diarrhea   [] Vomiting  [x] Gastroesophageal reflux/heartburn   [] Difficulty swallowing. Genitourinary:  [] Chronic kidney disease   [] Difficult urination  [] Frequent urination   [] Blood in urine Skin:  [] Rashes   [] Ulcers  Psychological:  [] History of anxiety   []  History of major depression.  Physical Examination  There were no vitals filed for this visit. There is no height or weight on file to calculate BMI. Gen: Patient is seen in a wheelchair with home oxygen by nasal cannula Head: [place]/AT, No temporalis wasting.  Ear/Nose/Throat: Hearing grossly intact, nares w/o erythema or drainage Eyes: PER, EOMI, sclera nonicteric.  Neck: Supple, no masses.  No bruit or JVD.  Pulmonary:  Good air movement, no audible wheezing, no use of accessory muscles.  Cardiac: RRR, normal S1, S2, no Murmurs. Vascular:  carotid bruit noted Vessel Right Left  Radial Palpable Palpable  Carotid  Palpable  Palpable  Gastrointestinal: soft, non-distended. No guarding/no peritoneal signs.  Musculoskeletal: M/S 5/5 throughout.  No visible deformity.  Neurologic: CN 2-12 intact. Pain and light touch intact in extremities.  Symmetrical.  Speech is fluent. Motor exam as listed above. Psychiatric: Judgment intact, Mood & affect appropriate for pt's clinical situation. Dermatologic: No rashes or ulcers noted.  No changes consistent with cellulitis.   CBC Lab Results  Component Value Date   WBC 6.2 02/12/2022   HGB 13.2 02/12/2022   HCT 41.2 02/12/2022   MCV 91.6 02/12/2022   PLT 233 02/12/2022    BMET    Component Value Date/Time   NA 135 02/12/2022 1416   NA 139 11/16/2019 1433   NA 136 07/21/2013 1517   K 4.0 02/12/2022 1416   K 4.2 07/21/2013 1517   CL 102 02/12/2022 1416   CL 105 07/21/2013 1517   CO2 21 (L) 02/12/2022 1416   CO2 22 07/21/2013 1517   GLUCOSE 96 02/12/2022 1416    GLUCOSE 131 (H) 07/21/2013 1517   BUN 16 02/12/2022 1416   BUN 28 (H) 11/16/2019 1433   BUN 19 (H) 07/21/2013 1517   CREATININE 0.79 02/12/2022 1416   CREATININE 0.94 07/21/2013 1517   CALCIUM 10.0 02/12/2022 1416   CALCIUM 9.4 07/21/2013 1517   GFRNONAA >60 02/12/2022 1416   GFRNONAA >60 07/21/2013 1517   GFRAA 60 11/16/2019 1433   GFRAA >60 07/21/2013 1517   CrCl cannot be calculated (Patient's most recent lab result is older than the maximum 21 days allowed.).  COAG Lab Results  Component Value Date   INR 1.5 (H) 09/05/2021   INR 1.4 (H) 09/04/2021   INR 1.0 04/11/2021    Radiology CUP PACEART REMOTE DEVICE CHECK  Result Date: 03/06/2022 Scheduled remote reviewed. Normal device function.  Next remote 91 days. [place]    Assessment/Plan 1. Bilateral carotid artery stenosis The patient remains asymptomatic with respect to the carotid stenosis.  However, the patient has now progressed and has a lesion the is >70%.  Patient should undergo CT angiography of the carotid arteries to define the degree of stenosis of the internal carotid arteries bilaterally and the anatomic suitability for surgery vs. intervention.  If the patient does indeed need surgery cardiac clearance will be required, once cleared the patient will be scheduled for surgery.  The risks, benefits and alternative therapies were reviewed in detail with the patient.  All questions were answered.  The patient agrees to proceed with imaging.  Continue antiplatelet therapy as prescribed. Continue management of CAD, HTN and Hyperlipidemia. Healthy heart diet, encouraged exercise at least 4 times per week.  - CT ANGIO NECK W OR WO CONTRAST; Future  2. Coronary artery disease involving native coronary artery of native heart without angina pectoris Continue cardiac and antihypertensive medications as already ordered and reviewed, no changes at this time.  Continue statin as ordered and reviewed, no changes at this  time  Nitrates PRN for chest pain  3. Paroxysmal atrial fibrillation (HCC) Continue antiarrhythmia medications as already ordered, these medications have been reviewed and there are no changes at this time.  Continue anticoagulation as ordered by Cardiology Service  4. Centrilobular emphysema ([place]) Continue pulmonary medications and aerosols  as already ordered, these medications have been reviewed and there are no changes at this time.   5. Hyperlipidemia LDL goal <70 Continue statin as ordered and reviewed, no changes at this time    Hortencia Pilar, MD  03/23/2022 1:05 PM

## 2022-03-24 ENCOUNTER — Telehealth: Payer: Self-pay | Admitting: Family Medicine

## 2022-03-24 ENCOUNTER — Encounter (INDEPENDENT_AMBULATORY_CARE_PROVIDER_SITE_OTHER): Payer: Self-pay | Admitting: Vascular Surgery

## 2022-03-24 ENCOUNTER — Ambulatory Visit (INDEPENDENT_AMBULATORY_CARE_PROVIDER_SITE_OTHER): Payer: Medicare Other | Admitting: Vascular Surgery

## 2022-03-24 VITALS — BP 108/69 | HR 69 | Resp 16

## 2022-03-24 DIAGNOSIS — I2510 Atherosclerotic heart disease of native coronary artery without angina pectoris: Secondary | ICD-10-CM

## 2022-03-24 DIAGNOSIS — I6523 Occlusion and stenosis of bilateral carotid arteries: Secondary | ICD-10-CM

## 2022-03-24 DIAGNOSIS — I480 Paroxysmal atrial fibrillation: Secondary | ICD-10-CM

## 2022-03-24 DIAGNOSIS — E785 Hyperlipidemia, unspecified: Secondary | ICD-10-CM

## 2022-03-24 DIAGNOSIS — J432 Centrilobular emphysema: Secondary | ICD-10-CM | POA: Diagnosis not present

## 2022-03-24 MED ORDER — ONDANSETRON HCL 4 MG PO TABS: 4.0000 mg | ORAL_TABLET | Freq: Three times a day (TID) | ORAL | 0 refills | Status: DC | PRN

## 2022-03-24 NOTE — Telephone Encounter (Signed)
I sent in the [place] she feels better soon   Please keep [place] posted  Please send for last ENT note if we don't have it

## 2022-03-24 NOTE — Telephone Encounter (Signed)
I spoke with pts husband (DPR signed); today nausea is better with taking the zofran. Pt only has one zofran left. Pt does not have H/A or fever. Pt's husband said lightheadedness comes and goes. Last time lightheaded was last night. Pt had testing for vertigo last wk at [facility]; pt has more testing to do at [facility]NT on 04/11/22; not sure what test is called. pt is not taking any other meds for vertigo. Pts husband said meclizine does not help pts vertigo. Last prescribed when pt was at [facility] ED on 02/13/23. [facility] [address]. Pts husband request cb after Dr Glori Bickers reviews note; UC & ED precautions given and pts husband voiced understanding. Sending note to Dr Glori Bickers and [employer].

## 2022-03-24 NOTE — Telephone Encounter (Signed)
Before I refill that - how are her symptoms?  Is she taking any other medicines for vertigo ? Any new symptoms? - headache/fever

## 2022-03-24 NOTE — Telephone Encounter (Signed)
Called and spoke to patient. Let her know information. She was just seen by ENT Thursday. Will send message back to pool to follow up on notes later in the week.

## 2022-03-24 NOTE — Telephone Encounter (Signed)
Patient called and said she was prescribed medication on 03/22/2021 ondansetron( Zofran) through a nurse line. Patient wanted a refill due to her vertigo. Call back number [phone].

## 2022-03-24 NOTE — Telephone Encounter (Signed)
Error

## 2022-03-24 NOTE — Telephone Encounter (Signed)
[place] Night - Client TELEPHONE ADVICE RECORD [facility] Patient Name: Diana Cook ALL Gender: Female DOB: Dec 18, 1950 Age: 72 Y 64 M 25 D Return Phone Number: [phone] (Primary), [phone] (Secondary) Address: [place] [address] Client [place] Night - Client Client Site [place] Provider Glori Bickers, Roque Lias - MD Contact Type Call Who Is Calling Patient / Member / Family / Caregiver Call Type Triage / Clinical Caller Name Jersee Winiarski Relationship To Patient Spouse Return Phone Number [phone] (Primary) Chief Complaint Dizziness Reason for Call Symptomatic / Request for [place] states his she nausea for 4 or 5 days, Dizzy, Translation No Nurse Assessment Nurse: Tito Dine, RN, Neoma Laming Date/Time Eilene Ghazi Time): 03/22/2022 12:44:58 PM Confirm and document reason for call. If symptomatic, describe symptoms. ---Caller states patient has nausea for 4 or 5 days feeling dizzy and vomited a little bit this morning. Denied blood in the vomit. Pt was seen on Friday by the ENT. Does the patient have any new or worsening symptoms? ---Yes Will a triage be completed? ---Yes Related visit to physician within the last 2 weeks? ---Yes Does the PT have any chronic conditions? (i.e. diabetes, asthma, this includes High risk factors for pregnancy, etc.) ---Yes List chronic conditions. ---Lung cancer Breast cancer COPD Pacemaker Epilepsy Is this a behavioral health or substance abuse call? ---No Guidelines Guideline Title Affirmed Question Affirmed Notes Nurse Date/Time (Eastern Time) Dizziness - Vertigo SEVERE dizziness (vertigo) (e.g., unable to walk without assistance) Tito Dine, RN, Neoma Laming 03/22/2022 12:48:56 PM Disp. Time Eilene Ghazi Time) Disposition Final User 03/22/2022 12:52:33 PM Go to ED Now (or PCP triage) Yes Tito Dine, RN, Deborah PLEASE NOTE: All timestamps contained within this report are represented as [place] Standard Time. CONFIDENTIALTY NOTICE: This fax transmission is intended only for the addressee. It contains information that is legally privileged, confidential or otherwise protected from use or disclosure. If you are not the intended recipient, you are strictly prohibited from reviewing, disclosing, copying using or disseminating any of this information or taking any action in reliance on or regarding this information. If you have received this fax in error, please notify us immediately by telephone so that we can arrange for its return to [place]. Phone: [phone], Toll-Free: [phone], Fax: [phone] Page: 2 of 4 Call Id: [id number] Final Disposition 03/22/2022 12:52:33 PM Go to ED Now (or PCP triage) Yes Tito Dine, RN, Garrel Ridgel Disagree/Comply Disagree Caller Understands No PreDisposition Call Doctor Care Advice Given Per Guideline GO TO ED NOW (OR PCP TRIAGE): * IF NO PCP (PRIMARY CARE PROVIDER) SECOND-LEVEL TRIAGE: You need to be seen within the next hour. Go to the [facility] at _____________ [place] as soon as you can. NOTE TO TRIAGER - DRIVING: * Another adult should drive. * Patient should not delay going to the emergency department. CARE ADVICE given per Dizziness - Vertigo (Adult) guideline. Standing Orders Preparation Additional Instructions Route FrequencyDuration Nurse Comments User Name Zofran 4mg  (regular tablet or ODT) 1-2 tablets as needed, # 6 Oral Every 8 Hours Tito Dine, RN, Neoma Laming Comments User: Nelwyn Salisbury, RN Date/Time Eilene Ghazi Time): 03/22/2022 12:54:36 PM Pharmacy # [phone] [facility] User: Nelwyn Salisbury, RN Date/Time Eilene Ghazi Time): 03/22/2022 1:04:20 PM SMO - called in to the pharmacy Referrals [place]

## 2022-03-25 ENCOUNTER — Telehealth (INDEPENDENT_AMBULATORY_CARE_PROVIDER_SITE_OTHER): Payer: Self-pay | Admitting: Vascular Surgery

## 2022-03-25 NOTE — Telephone Encounter (Signed)
Spoke with pt this morning regarding her CT ordered by Dr. Delana Meyer. No prior Diana Cook is required. I advised to call the radiology scheduling and make sure to make the appt before her 2.15.24 follow up with Dr. Delana Meyer. Pt acknowledged and will call to make CT appt. Nothing further is needed at this time.

## 2022-03-26 NOTE — Progress Notes (Signed)
Remote pacemaker transmission.   

## 2022-03-27 ENCOUNTER — Ambulatory Visit: Payer: Medicare Other | Admitting: Internal Medicine

## 2022-04-02 ENCOUNTER — Other Ambulatory Visit: Payer: Self-pay | Admitting: Pulmonary Disease

## 2022-04-03 ENCOUNTER — Ambulatory Visit: Payer: Medicare Other | Admitting: Internal Medicine

## 2022-04-03 ENCOUNTER — Ambulatory Visit
Admission: RE | Admit: 2022-04-03 | Discharge: 2022-04-03 | Disposition: A | Discharge status: Home / Self Care | Payer: Medicare Other | Source: Ambulatory Visit | Attending: Vascular Surgery | Admitting: Vascular Surgery

## 2022-04-03 DIAGNOSIS — I6523 Occlusion and stenosis of bilateral carotid arteries: Secondary | ICD-10-CM | POA: Diagnosis not present

## 2022-04-03 MED ORDER — IOHEXOL 350 MG/ML SOLN
75.0000 mL | Freq: Once | INTRAVENOUS | Status: AC | PRN
  Administered 2022-04-03: 75 mL via INTRAVENOUS

## 2022-04-09 NOTE — Progress Notes (Signed)
MRN : [id number]  Diana Cook is a 72 y.o. ([birth year]) female who presents with chief complaint of check carotid arteries.  History of Present Illness:   The patient is seen for follow up evaluation of carotid stenosis status post CT angiogram. CT scan was done 04/03/2022. Patient reports that the test went well with no problems or complications.   The patient denies interval amaurosis fugax. There is no recent or interval TIA symptoms or focal motor deficits. There is no prior documented CVA.  The patient is taking enteric-coated aspirin 81 mg daily.  There is no history of migraine headaches. There is no history of seizures.  No recent shortening of the patient's walking distance or new symptoms consistent with claudication.  No history of rest pain symptoms. No new ulcers or wounds of the lower extremities have occurred.  There is no history of DVT, PE or superficial thrombophlebitis. No recent episodes of angina or shortness of breath documented.   CT angiogram is reviewed by me personally and shows 80% stenosis consistent with calcified plaque at the origin of the right  internal carotid artery. (Soft and calcified plaque at the right carotid bifurcation and ICA bulb. Ulcerated plaque or wind sock deformity of the ICA bulb. Marked narrowing of the true lumen with diameter as narrow as 1 mm.  Compared to a more distal cervical ICA expected diameter of 5 mm, this would indicate an 80% stenosis. Distal ICA is somewhat smaller than expected because of reduced flow. )  No outpatient medications have been marked as taking for the 04/10/22 encounter (Appointment) with Delana Meyer, Dolores Lory, MD.    Past Medical History:  Diagnosis Date   Allergic rhinitis    Arthritis    Asthma    as a child, mild now   Breast cancer ([place]) 12/2015   right breast cancer, lumpectomy and mammosite    Colon polyps    colonoscopy 7/08, tubular adenoma   Complete heart block ([place]) 09/2011   s/p PPM  implanted in [facility]   COPD (chronic obstructive pulmonary disease) ([place])    Myocardial infarction ([place]) 2011   Osteopenia 10/2015   Pacemaker    2011   Paroxysmal atrial fibrillation ([place]) 03/2021   Incidentally detected on pacemaker interrogation   Personal history of radiation therapy 2017   right breast ca, mammosite placed   Seizure disorder ([place])    Seizures ([place])    first one was when she was 72 years old    Small bowel obstruction ([place])    1988 and 2002   Tobacco abuse     Past Surgical History:  Procedure Laterality Date   ABDOMINAL HYSTERECTOMY     BREAST BIOPSY Right 2007   benign inflammatory changes, mass due to underwire bra   BREAST BIOPSY Left 01/02/2016   columnar cell changes without atypical hyperplasia.   BREAST BIOPSY Right 12/06/2015   rt breast mass 10:00, bx done at Dr. [place] office, invasive ductal carcinoma   BREAST EXCISIONAL BIOPSY Left 01/02/2016   COLUMNAR CELL CHANGE AND HYPERPLASIA ASSOCIATED WITH LUMINAL AND STROMAL CALCIFICATIONS   BREAST LUMPECTOMY Right 01/02/2016   invasive mammary carcinoma, clear margins, negative LN   BREAST LUMPECTOMY WITH SENTINEL LYMPH NODE BIOPSY Right 01/02/2016   pT1c, N0; ER/ PR 100%; Her 2 neu not over expressed: BREAST LUMPECTOMY WITH SENTINEL LYMPH NODE BX;  Surgeon: [name], MD;  Location: [facility] ORS;  Service: General;  Laterality: Right;   CARDIAC  CATHETERIZATION     CATARACT EXTRACTION Bilateral    COLONOSCOPY  10/2015   Dr Ardis Hughs   COLONOSCOPY WITH PROPOFOL N/A 08/19/2018   Procedure: COLONOSCOPY WITH PROPOFOL;  Surgeon: Virgel Manifold, MD;  Location: [facility] ENDOSCOPY;  Service: Endoscopy;  Laterality: N/A;   ESOPHAGOGASTRODUODENOSCOPY (EGD) WITH PROPOFOL N/A 08/19/2018   Procedure: ESOPHAGOGASTRODUODENOSCOPY (EGD) WITH PROPOFOL;  Surgeon: Virgel Manifold, MD;  Location: [facility] ENDOSCOPY;  Service: Endoscopy;  Laterality: N/A;   EXPLORATORY LAPAROTOMY  01/25/2001   Exploratory  laparotomy, lysis of adhesions, identification of internal hernia secondary to omental adhesion. Prolonged postoperative ileus.   gyn surgery  1993   hysterectomy- form endometriosis   LAPAROSCOPY     PACEMAKER INSERTION  10/24/11   [employer] Advantio dual chamber PPM implanted by Dr Bunnie Philips at [facility] in [place] N/A 12/05/2019   Procedure: [place];  Surgeon: Deboraha Sprang, MD;  Location: [place] [facility];  Service: Cardiovascular;  Laterality: N/A;   RIGHT/LEFT HEART CATH AND CORONARY ANGIOGRAPHY Bilateral 07/19/2019   Procedure: RIGHT/LEFT HEART CATH AND CORONARY ANGIOGRAPHY;  Surgeon: Nelva Bush, MD;  Location: [place] [facility];  Service: Cardiovascular;  Laterality: Bilateral;   TEMPORARY PACEMAKER N/A 12/05/2019   Procedure: TEMPORARY PACEMAKER;  Surgeon: Deboraha Sprang, MD;  Location: [place] [facility];  Service: Cardiovascular;  Laterality: N/A;    Social History Social History   Tobacco Use   Smoking status: Former    Packs/day: 1.50    Years: 42.00    Total pack years: 63.00    Types: Cigarettes    Quit date: 11/11/2010    Years since quitting: 11.4   Smokeless tobacco: Never  Vaping Use   Vaping Use: Never used  Substance Use Topics   Alcohol use: Yes    Alcohol/week: 1.0 - 2.0 standard drink of alcohol    Types: 1 - 2 Glasses of wine per week    Comment: weekly   Drug use: No    Family History Family History  Problem Relation Age of Onset   Stroke Mother    Heart disease Mother 61       MI and CABG   Dementia Mother    Coronary artery disease Father    Parkinsonism Father    Breast cancer Sister    Cancer - Cervical Daughter 56       died 2022-03-17   Heart attack Brother 80   Colon cancer Neg Hx     Allergies  Allergen Reactions   Codeine Nausea And Vomiting   Morphine And Related Nausea Only     REVIEW OF SYSTEMS (Negative unless checked)  Constitutional:  '[]'$ Weight loss  '[]'$ Fever  '[]'$ Chills Cardiac: '[]'$ Chest pain   '[]'$ Chest pressure   '[]'$ Palpitations   '[]'$ Shortness of breath when laying flat   '[]'$ Shortness of breath with exertion. Vascular:  '[x]'$ Pain in legs with walking   '[]'$ Pain in legs at rest  '[]'$ History of DVT   '[]'$ Phlebitis   '[]'$ Swelling in legs   '[]'$ Varicose veins   '[]'$ Non-healing ulcers Pulmonary:   '[]'$ Uses home oxygen   '[]'$ Productive cough   '[]'$ Hemoptysis   '[]'$ Wheeze  '[]'$ COPD   '[x]'$ Asthma Neurologic:  '[]'$ Dizziness   '[]'$ Seizures   '[]'$ History of stroke   '[]'$ History of TIA  '[]'$ Aphasia   '[]'$ Vissual changes   '[]'$ Weakness or numbness in arm   '[]'$ Weakness or numbness in leg Musculoskeletal:   '[]'$ Joint swelling   '[x]'$ Joint pain   '[]'$ Low back  pain Hematologic:  '[]'$ Easy bruising  '[]'$ Easy bleeding   '[]'$ Hypercoagulable state   '[]'$ Anemic Gastrointestinal:  '[]'$ Diarrhea   '[]'$ Vomiting  '[x]'$ Gastroesophageal reflux/heartburn   '[]'$ Difficulty swallowing. Genitourinary:  '[]'$ Chronic kidney disease   '[]'$ Difficult urination  '[]'$ Frequent urination   '[]'$ Blood in urine Skin:  '[]'$ Rashes   '[]'$ Ulcers  Psychological:  '[]'$ History of anxiety   '[]'$  History of major depression.  Physical Examination  There were no vitals filed for this visit. There is no height or weight on file to calculate BMI. Gen: WD/WN, NAD Head: [place]/AT, No temporalis wasting.  Ear/Nose/Throat: Hearing grossly intact, nares w/o erythema or drainage Eyes: PER, EOMI, sclera nonicteric.  Neck: Supple, no masses.  No bruit or JVD.  Pulmonary:  Good air movement, no audible wheezing, no use of accessory muscles.  Cardiac: RRR, normal S1, S2, no Murmurs. Vascular:  carotid bruit noted right high pitched Vessel Right Left  Radial Palpable Palpable  Carotid  Palpable  Palpable  Gastrointestinal: soft, non-distended. No guarding/no peritoneal signs.  Musculoskeletal: M/S 5/5 throughout.  No visible deformity.  Neurologic: CN 2-12 intact. Pain and light touch intact in extremities.  Symmetrical.  Speech is fluent. Motor exam as listed  above. Psychiatric: Judgment intact, Mood & affect appropriate for pt's clinical situation. Dermatologic: No rashes or ulcers noted.  No changes consistent with cellulitis.   CBC Lab Results  Component Value Date   WBC 6.2 02/12/2022   HGB 13.2 02/12/2022   HCT 41.2 02/12/2022   MCV 91.6 02/12/2022   PLT 233 02/12/2022    BMET    Component Value Date/Time   NA 135 02/12/2022 1416   NA 139 11/16/2019 1433   NA 136 07/21/2013 1517   K 4.0 02/12/2022 1416   K 4.2 07/21/2013 1517   CL 102 02/12/2022 1416   CL 105 07/21/2013 1517   CO2 21 (L) 02/12/2022 1416   CO2 22 07/21/2013 1517   GLUCOSE 96 02/12/2022 1416   GLUCOSE 131 (H) 07/21/2013 1517   BUN 16 02/12/2022 1416   BUN 28 (H) 11/16/2019 1433   BUN 19 (H) 07/21/2013 1517   CREATININE 0.79 02/12/2022 1416   CREATININE 0.94 07/21/2013 1517   CALCIUM 10.0 02/12/2022 1416   CALCIUM 9.4 07/21/2013 1517   GFRNONAA >60 02/12/2022 1416   GFRNONAA >60 07/21/2013 1517   GFRAA 60 11/16/2019 1433   GFRAA >60 07/21/2013 1517   CrCl cannot be calculated (Patient's most recent lab result is older than the maximum 21 days allowed.).  COAG Lab Results  Component Value Date   INR 1.5 (H) 09/05/2021   INR 1.4 (H) 09/04/2021   INR 1.0 04/11/2021    Radiology CT ANGIO NECK W OR WO CONTRAST  Result Date: 04/05/2022 CLINICAL DATA:  Carotid artery stenosis. Worsening vertigo. Dizziness. EXAM: CT ANGIOGRAPHY NECK TECHNIQUE: Multidetector CT imaging of the neck was performed using the standard protocol during bolus administration of intravenous contrast. Multiplanar CT image reconstructions and MIPs were obtained to evaluate the vascular anatomy. Carotid stenosis measurements (when applicable) are obtained utilizing NASCET criteria, using the distal internal carotid diameter as the denominator. RADIATION DOSE REDUCTION: This exam was performed according to the departmental dose-optimization program which includes automated exposure  control, adjustment of the mA and/or kV according to patient size and/or use of iterative reconstruction technique. CONTRAST:  68m OMNIPAQUE IOHEXOL 350 MG/ML SOLN COMPARISON:  Ultrasound 03/13/2022.  Thyroid ultrasound 11/05/2021. FINDINGS: Aortic arch: Aortic atherosclerosis. Branching pattern is normal without origin stenosis. Right carotid system: Common carotid artery widely  patent to the bifurcation. Some scattered plaque but no common carotid stenosis. Soft and calcified plaque at the carotid bifurcation and ICA bulb. Ulcerated plaque or wind sock deformity of the ICA bulb. Marked narrowing of the true lumen with diameter as narrow as 1 mm. Compared to a more distal cervical ICA expected diameter of 5 mm, this would indicate an 80% stenosis. Distal ICA is somewhat smaller than expected because of reduced in flow. This is a significant stenosis. Left carotid system: Common carotid artery shows calcified plaque but is widely patent to the bifurcation. Soft and calcified plaque at the carotid bifurcation and ICA bulb but no stenosis. Cervical ICA is normal beyond that. Vertebral arteries: Left vertebral artery dominant. Both vertebral arteries are widely patent at their origins and through the cervical region to the foramen magnum. Skeleton: Ordinary cervical spondylosis and facet arthropathy. Other neck: No lymphadenopathy. Left thyroid nodule measuring 2.5 cm in length with a transverse diameter of 1.6 cm. Some internal low density. Similar appearance to the prior ultrasound of 11/05/2021. Recommendations at that time were that this nodule meets imaging criteria for recommendation of percutaneous sampling. Upper chest: Emphysema and pulmonary scarring. IMPRESSION: 1. Soft and calcified plaque at the right carotid bifurcation and ICA bulb. Ulcerated plaque or wind sock deformity of the ICA bulb. Marked narrowing of the true lumen with diameter as narrow as 1 mm. Compared to a more distal cervical ICA expected  diameter of 5 mm, this would indicate an 80% stenosis. Distal ICA is somewhat smaller than expected because of reduced flow. 2. Atherosclerotic change at the left carotid bifurcation but no stenosis. 3. No vertebral artery stenosis. 4. 2.5 cm left thyroid nodule. Recommendations at the time of previous thyroid ultrasound were that this nodule meets imaging criteria for recommendation of percutaneous sampling. 5. Aortic atherosclerosis. Aortic Atherosclerosis (ICD10-I70.0). Electronically Signed   By: Nelson Chimes M.D.   On: 04/05/2022 16:31     Assessment/Plan 1. Symptomatic stenosis of both carotid arteries without infarction Recommend:  The patient is symptomatic with respect to the carotid stenosis.  The patient now has progressed and has a lesion the is >80%.  I have completed Shared Decision-Making with Breane C Hallprior to carotid surgery/intervention. The conversation included: -Discussion of all treatment options including carotid endarterectomy (CEA), carotid artery stenting (which includes transcarotid artery revascularization (TCAR)), and optimal medical therapy (OMT). -Explanation of risks and benefits for each option specific to [place] clinical situation. -Integration of clinical guidelines as it relates to the patient's history and comorbidities. -Discussion and incorporation of Preslee C Donahue and their personal preferences and priorities in choosing a treatment plan plan  If the patient was unable to participate in Shared Decision Making this process was done with the patient.  Patient's CT angiography of the carotid arteries confirms >80%  right ICA stenosis.  The anatomical considerations support stenting over surgery.  This was discussed in detail with the patient.  The risks, benefits and alternative therapies were reviewed in detail with the patient.  All questions were answered.  The patient agrees to proceed with stenting of the right carotid artery.  The  patient's NIHSS score is as follows: 1 Mild: 1 - 5 Mild to Moderately Severe: 5 - 14 Severe: 15 - 24 Very Severe: >25  Continue antiplatelet therapy as prescribed. Continue management of CAD, HTN and Hyperlipidemia. Healthy heart diet, encouraged exercise at least 4 times per week.   2. Paroxysmal atrial fibrillation (HCC) Continue antiarrhythmia medications as  already ordered, these medications have been reviewed and there are no changes at this time.  Continue anticoagulation as ordered by Cardiology Service  3. Coronary artery disease involving native coronary artery of native heart without angina pectoris Continue cardiac and antihypertensive medications as already ordered and reviewed, no changes at this time.  Continue statin as ordered and reviewed, no changes at this time  Nitrates PRN for chest pain  4. Dyslipidemia Continue statin as ordered and reviewed, no changes at this time    Hortencia Pilar, MD  04/09/2022 3:00 PM

## 2022-04-09 NOTE — H&P (View-Only) (Signed)
MRN : [id number]  Diana Cook is a 72 y.o. ([birth year]) female who presents with chief complaint of check carotid arteries.  History of Present Illness:   The patient is seen for follow up evaluation of carotid stenosis status post CT angiogram. CT scan was done 04/03/2022. Patient reports that the test went well with no problems or complications.   The patient denies interval amaurosis fugax. There is no recent or interval TIA symptoms or focal motor deficits. There is no prior documented CVA.  The patient is taking enteric-coated aspirin 81 mg daily.  There is no history of migraine headaches. There is no history of seizures.  No recent shortening of the patient's walking distance or new symptoms consistent with claudication.  No history of rest pain symptoms. No new ulcers or wounds of the lower extremities have occurred.  There is no history of DVT, PE or superficial thrombophlebitis. No recent episodes of angina or shortness of breath documented.   CT angiogram is reviewed by me personally and shows 80% stenosis consistent with calcified plaque at the origin of the right  internal carotid artery. (Soft and calcified plaque at the right carotid bifurcation and ICA bulb. Ulcerated plaque or wind sock deformity of the ICA bulb. Marked narrowing of the true lumen with diameter as narrow as 1 mm.  Compared to a more distal cervical ICA expected diameter of 5 mm, this would indicate an 80% stenosis. Distal ICA is somewhat smaller than expected because of reduced flow. )  No outpatient medications have been marked as taking for the 04/10/22 encounter (Appointment) with Delana Meyer, Dolores Lory, MD.    Past Medical History:  Diagnosis Date   Allergic rhinitis    Arthritis    Asthma    as a child, mild now   Breast cancer ([place]) 12/2015   right breast cancer, lumpectomy and mammosite    Colon polyps    colonoscopy 7/08, tubular adenoma   Complete heart block ([place]) 09/2011   s/p PPM  implanted in [facility]   COPD (chronic obstructive pulmonary disease) ([place])    Myocardial infarction ([place]) 2011   Osteopenia 10/2015   Pacemaker    2011   Paroxysmal atrial fibrillation ([place]) 03/2021   Incidentally detected on pacemaker interrogation   Personal history of radiation therapy 2017   right breast ca, mammosite placed   Seizure disorder ([place])    Seizures ([place])    first one was when she was 72 years old    Small bowel obstruction ([place])    1988 and 2002   Tobacco abuse     Past Surgical History:  Procedure Laterality Date   ABDOMINAL HYSTERECTOMY     BREAST BIOPSY Right 2007   benign inflammatory changes, mass due to underwire bra   BREAST BIOPSY Left 01/02/2016   columnar cell changes without atypical hyperplasia.   BREAST BIOPSY Right 12/06/2015   rt breast mass 10:00, bx done at Dr. Curly [place] office, invasive ductal carcinoma   BREAST EXCISIONAL BIOPSY Left 01/02/2016   COLUMNAR CELL CHANGE AND HYPERPLASIA ASSOCIATED WITH LUMINAL AND STROMAL CALCIFICATIONS   BREAST LUMPECTOMY Right 01/02/2016   invasive mammary carcinoma, clear margins, negative LN   BREAST LUMPECTOMY WITH SENTINEL LYMPH NODE BIOPSY Right 01/02/2016   pT1c, N0; ER/ PR 100%; Her 2 neu not over expressed: BREAST LUMPECTOMY WITH SENTINEL LYMPH NODE BX;  Surgeon: [name], MD;  Location: [facility];  Service: General;  Laterality: Right;   CARDIAC  CATHETERIZATION     CATARACT EXTRACTION Bilateral    COLONOSCOPY  10/2015   Dr Ardis Hughs   COLONOSCOPY WITH PROPOFOL N/A 08/19/2018   Procedure: COLONOSCOPY WITH PROPOFOL;  Surgeon: Virgel Manifold, MD;  Location: [facility] END[facility];  Service: Endoscopy;  Laterality: N/A;   ESOPHAGOGASTRODUODENOSCOPY (EGD) WITH PROPOFOL N/A 08/19/2018   Procedure: ESOPHAGOGASTRODUODENOSCOPY (EGD) WITH PROPOFOL;  Surgeon: Virgel Manifold, MD;  Location: [facility] END[facility];  Service: Endoscopy;  Laterality: N/A;   EXPLORATORY LAPAROTOMY  01/25/2001   Exploratory  laparotomy, lysis of adhesions, identification of internal hernia secondary to omental adhesion. Prolonged postoperative ileus.   gyn surgery  1993   hysterectomy- form endometriosis   LAPAROSCOPY     PACEMAKER INSERTION  10/24/11   [employer] Advantio dual chamber PPM implanted by Dr Bunnie Philips at [facility] in [place] N/A 12/05/2019   Procedure: [place];  Surgeon: Deboraha Sprang, MD;  Location: [place] [facility];  Service: Cardiovascular;  Laterality: N/A;   RIGHT/LEFT HEART CATH AND CORONARY ANGIOGRAPHY Bilateral 07/19/2019   Procedure: RIGHT/LEFT HEART CATH AND CORONARY ANGIOGRAPHY;  Surgeon: Nelva Bush, MD;  Location: [place] [facility];  Service: Cardiovascular;  Laterality: Bilateral;   TEMPORARY PACEMAKER N/A 12/05/2019   Procedure: TEMPORARY PACEMAKER;  Surgeon: Deboraha Sprang, MD;  Location: [place] [facility];  Service: Cardiovascular;  Laterality: N/A;    Social History Social History   Tobacco Use   Smoking status: Former    Packs/day: 1.50    Years: 42.00    Total pack years: 63.00    Types: Cigarettes    Quit date: 11/11/2010    Years since quitting: 11.4   Smokeless tobacco: Never  Vaping Use   Vaping Use: Never used  Substance Use Topics   Alcohol use: Yes    Alcohol/week: 1.0 - 2.0 standard drink of alcohol    Types: 1 - 2 Glasses of wine per week    Comment: weekly   Drug use: No    Family History Family History  Problem Relation Age of Onset   Stroke Mother    Heart disease Mother 72       MI and CABG   Dementia Mother    Coronary artery disease Father    Parkinsonism Father    Breast cancer Sister    Cancer - Cervical Daughter 28       died 03-31-22   Heart attack Brother 45   Colon cancer Neg Hx     Allergies  Allergen Reactions   Codeine Nausea And Vomiting   Morphine And Related Nausea Only     REVIEW OF SYSTEMS (Negative unless checked)  Constitutional:  '[]'$ Weight loss  '[]'$ Fever  '[]'$ Chills Cardiac: '[]'$ Chest pain   '[]'$ Chest pressure   '[]'$ Palpitations   '[]'$ Shortness of breath when laying flat   '[]'$ Shortness of breath with exertion. Vascular:  '[x]'$ Pain in legs with walking   '[]'$ Pain in legs at rest  '[]'$ History of DVT   '[]'$ Phlebitis   '[]'$ Swelling in legs   '[]'$ Varicose veins   '[]'$ Non-healing ulcers Pulmonary:   '[]'$ Uses home oxygen   '[]'$ Productive cough   '[]'$ Hemoptysis   '[]'$ Wheeze  '[]'$ COPD   '[x]'$ Asthma Neurologic:  '[]'$ Dizziness   '[]'$ Seizures   '[]'$ History of stroke   '[]'$ History of TIA  '[]'$ Aphasia   '[]'$ Vissual changes   '[]'$ Weakness or numbness in arm   '[]'$ Weakness or numbness in leg Musculoskeletal:   '[]'$ Joint swelling   '[x]'$ Joint pain   '[]'$ Low back  pain Hematologic:  '[]'$ Easy bruising  '[]'$ Easy bleeding   '[]'$ Hypercoagulable state   '[]'$ Anemic Gastrointestinal:  '[]'$ Diarrhea   '[]'$ Vomiting  '[x]'$ Gastroesophageal reflux/heartburn   '[]'$ Difficulty swallowing. Genitourinary:  '[]'$ Chronic kidney disease   '[]'$ Difficult urination  '[]'$ Frequent urination   '[]'$ Blood in urine Skin:  '[]'$ Rashes   '[]'$ Ulcers  Psychological:  '[]'$ History of anxiety   '[]'$  History of major depression.  Physical Examination  There were no vitals filed for this visit. There is no height or weight on file to calculate BMI. Gen: WD/WN, NAD Head: [place]/AT, No temporalis wasting.  Ear/Nose/Throat: Hearing grossly intact, nares w/o erythema or drainage Eyes: PER, EOMI, sclera nonicteric.  Neck: Supple, no masses.  No bruit or JVD.  Pulmonary:  Good air movement, no audible wheezing, no use of accessory muscles.  Cardiac: RRR, normal S1, S2, no Murmurs. Vascular:  carotid bruit noted right high pitched Vessel Right Left  Radial Palpable Palpable  Carotid  Palpable  Palpable  Gastrointestinal: soft, non-distended. No guarding/no peritoneal signs.  Musculoskeletal: M/S 5/5 throughout.  No visible deformity.  Neurologic: CN 2-12 intact. Pain and light touch intact in extremities.  Symmetrical.  Speech is fluent. Motor exam as listed  above. Psychiatric: Judgment intact, Mood & affect appropriate for pt's clinical situation. Dermatologic: No rashes or ulcers noted.  No changes consistent with cellulitis.   CBC Lab Results  Component Value Date   WBC 6.2 02/12/2022   HGB 13.2 02/12/2022   HCT 41.2 02/12/2022   MCV 91.6 02/12/2022   PLT 233 02/12/2022    BMET    Component Value Date/Time   NA 135 02/12/2022 1416   NA 139 11/16/2019 1433   NA 136 07/21/2013 1517   K 4.0 02/12/2022 1416   K 4.2 07/21/2013 1517   CL 102 02/12/2022 1416   CL 105 07/21/2013 1517   CO2 21 (L) 02/12/2022 1416   CO2 22 07/21/2013 1517   GLUCOSE 96 02/12/2022 1416   GLUCOSE 131 (H) 07/21/2013 1517   BUN 16 02/12/2022 1416   BUN 28 (H) 11/16/2019 1433   BUN 19 (H) 07/21/2013 1517   CREATININE 0.79 02/12/2022 1416   CREATININE 0.94 07/21/2013 1517   CALCIUM 10.0 02/12/2022 1416   CALCIUM 9.4 07/21/2013 1517   GFRNONAA >60 02/12/2022 1416   GFRNONAA >60 07/21/2013 1517   GFRAA 60 11/16/2019 1433   GFRAA >60 07/21/2013 1517   CrCl cannot be calculated (Patient's most recent lab result is older than the maximum 21 days allowed.).  COAG Lab Results  Component Value Date   INR 1.5 (H) 09/05/2021   INR 1.4 (H) 09/04/2021   INR 1.0 04/11/2021    Radiology CT ANGIO NECK W OR WO CONTRAST  Result Date: 04/05/2022 CLINICAL DATA:  Carotid artery stenosis. Worsening vertigo. Dizziness. EXAM: CT ANGIOGRAPHY NECK TECHNIQUE: Multidetector CT imaging of the neck was performed using the standard protocol during bolus administration of intravenous contrast. Multiplanar CT image reconstructions and MIPs were obtained to evaluate the vascular anatomy. Carotid stenosis measurements (when applicable) are obtained utilizing NASCET criteria, using the distal internal carotid diameter as the denominator. RADIATION DOSE REDUCTION: This exam was performed according to the departmental dose-optimization program which includes automated exposure  control, adjustment of the mA and/or kV according to patient size and/or use of iterative reconstruction technique. CONTRAST:  65m OMNIPAQUE IOHEXOL 350 MG/ML SOLN COMPARISON:  Ultrasound 03/13/2022.  Thyroid ultrasound 11/05/2021. FINDINGS: Aortic arch: Aortic atherosclerosis. Branching pattern is normal without origin stenosis. Right carotid system: Common carotid artery widely  patent to the bifurcation. Some scattered plaque but no common carotid stenosis. Soft and calcified plaque at the carotid bifurcation and ICA bulb. Ulcerated plaque or wind sock deformity of the ICA bulb. Marked narrowing of the true lumen with diameter as narrow as 1 mm. Compared to a more distal cervical ICA expected diameter of 5 mm, this would indicate an 80% stenosis. Distal ICA is somewhat smaller than expected because of reduced in flow. This is a significant stenosis. Left carotid system: Common carotid artery shows calcified plaque but is widely patent to the bifurcation. Soft and calcified plaque at the carotid bifurcation and ICA bulb but no stenosis. Cervical ICA is normal beyond that. Vertebral arteries: Left vertebral artery dominant. Both vertebral arteries are widely patent at their origins and through the cervical region to the foramen magnum. Skeleton: Ordinary cervical spondylosis and facet arthropathy. Other neck: No lymphadenopathy. Left thyroid nodule measuring 2.5 cm in length with a transverse diameter of 1.6 cm. Some internal low density. Similar appearance to the prior ultrasound of 11/05/2021. Recommendations at that time were that this nodule meets imaging criteria for recommendation of percutaneous sampling. Upper chest: Emphysema and pulmonary scarring. IMPRESSION: 1. Soft and calcified plaque at the right carotid bifurcation and ICA bulb. Ulcerated plaque or wind sock deformity of the ICA bulb. Marked narrowing of the true lumen with diameter as narrow as 1 mm. Compared to a more distal cervical ICA expected  diameter of 5 mm, this would indicate an 80% stenosis. Distal ICA is somewhat smaller than expected because of reduced flow. 2. Atherosclerotic change at the left carotid bifurcation but no stenosis. 3. No vertebral artery stenosis. 4. 2.5 cm left thyroid nodule. Recommendations at the time of previous thyroid ultrasound were that this nodule meets imaging criteria for recommendation of percutaneous sampling. 5. Aortic atherosclerosis. Aortic Atherosclerosis (ICD10-I70.0). Electronically Signed   By: Nelson Chimes M.D.   On: 04/05/2022 16:31     Assessment/Plan 1. Symptomatic stenosis of both carotid arteries without infarction Recommend:  The patient is symptomatic with respect to the carotid stenosis.  The patient now has progressed and has a lesion the is >80%.  I have completed Shared Decision-Making with Miah C Hallprior to carotid surgery/intervention. The conversation included: -Discussion of all treatment options including carotid endarterectomy (CEA), carotid artery stenting (which includes transcarotid artery revascularization (TCAR)), and optimal medical therapy (OMT). -Explanation of risks and benefits for each option specific to [place] clinical situation. -Integration of clinical guidelines as it relates to the patient's history and comorbidities. -Discussion and incorporation of Ruhee C Closser and their personal preferences and priorities in choosing a treatment plan plan  If the patient was unable to participate in Shared Decision Making this process was done with the patient.  Patient's CT angiography of the carotid arteries confirms >80%  right ICA stenosis.  The anatomical considerations support stenting over surgery.  This was discussed in detail with the patient.  The risks, benefits and alternative therapies were reviewed in detail with the patient.  All questions were answered.  The patient agrees to proceed with stenting of the right carotid artery.  The  patient's NIHSS score is as follows: 1 Mild: 1 - 5 Mild to Moderately Severe: 5 - 14 Severe: 15 - 24 Very Severe: >25  Continue antiplatelet therapy as prescribed. Continue management of CAD, HTN and Hyperlipidemia. Healthy heart diet, encouraged exercise at least 4 times per week.   2. Paroxysmal atrial fibrillation (HCC) Continue antiarrhythmia medications as  already ordered, these medications have been reviewed and there are no changes at this time.  Continue anticoagulation as ordered by Cardiology Service  3. Coronary artery disease involving native coronary artery of native heart without angina pectoris Continue cardiac and antihypertensive medications as already ordered and reviewed, no changes at this time.  Continue statin as ordered and reviewed, no changes at this time  Nitrates PRN for chest pain  4. Dyslipidemia Continue statin as ordered and reviewed, no changes at this time    Hortencia Pilar, MD  04/09/2022 3:00 PM

## 2022-04-10 ENCOUNTER — Encounter (INDEPENDENT_AMBULATORY_CARE_PROVIDER_SITE_OTHER): Payer: Self-pay | Admitting: Vascular Surgery

## 2022-04-10 ENCOUNTER — Ambulatory Visit (INDEPENDENT_AMBULATORY_CARE_PROVIDER_SITE_OTHER): Payer: Medicare Other | Admitting: Vascular Surgery

## 2022-04-10 VITALS — BP 105/59 | HR 85 | Resp 16 | Wt 190.6 lb

## 2022-04-10 DIAGNOSIS — E785 Hyperlipidemia, unspecified: Secondary | ICD-10-CM | POA: Diagnosis not present

## 2022-04-10 DIAGNOSIS — I2510 Atherosclerotic heart disease of native coronary artery without angina pectoris: Secondary | ICD-10-CM | POA: Diagnosis not present

## 2022-04-10 DIAGNOSIS — I480 Paroxysmal atrial fibrillation: Secondary | ICD-10-CM | POA: Diagnosis not present

## 2022-04-10 DIAGNOSIS — I6523 Occlusion and stenosis of bilateral carotid arteries: Secondary | ICD-10-CM | POA: Diagnosis not present

## 2022-04-14 ENCOUNTER — Encounter: Payer: Self-pay | Admitting: Pulmonary Disease

## 2022-04-14 ENCOUNTER — Ambulatory Visit: Payer: Medicare Other | Admitting: Pulmonary Disease

## 2022-04-14 VITALS — BP 120/78 | HR 68 | Temp 97.5°F | Ht 68.0 in | Wt 189.0 lb

## 2022-04-14 DIAGNOSIS — J432 Centrilobular emphysema: Secondary | ICD-10-CM

## 2022-04-14 DIAGNOSIS — J9611 Chronic respiratory failure with hypoxia: Secondary | ICD-10-CM

## 2022-04-14 DIAGNOSIS — I6521 Occlusion and stenosis of right carotid artery: Secondary | ICD-10-CM | POA: Diagnosis not present

## 2022-04-14 DIAGNOSIS — R911 Solitary pulmonary nodule: Secondary | ICD-10-CM | POA: Diagnosis not present

## 2022-04-14 MED ORDER — ALBUTEROL SULFATE HFA 108 (90 BASE) MCG/ACT IN AERS: 2.0000 | INHALATION_SPRAY | RESPIRATORY_TRACT | 1 refills | Status: DC | PRN

## 2022-04-14 NOTE — Patient Instructions (Signed)
Your lungs sounded good today.  You are doing well with your current level of oxygen.  We have sent a prescription for your rescue inhaler (albuterol) to the [place] [facility].  We will see you in follow-up in 3 to 4 months time call sooner should any new problems arise.

## 2022-04-14 NOTE — Progress Notes (Signed)
Subjective:    Patient ID: Diana Cook, female    DOB: 01/27/1951, 72 y.o.   MRN: [id number] Patient Care Team: Tower, Wynelle Fanny, MD as PCP - General (Family Medicine) End, Harrell Gave, MD as PCP - Cardiology (Cardiology) Deboraha Sprang, MD as Consulting Physician (Cardiology) Lorelee Cover., MD as Referring Physician (Ophthalmology) Tower, Wynelle Fanny, MD as Consulting Physician (Family Medicine) Bary Castilla, Forest Gleason, MD (General Surgery)  Chief Complaint  Patient presents with   Follow-up    SOB and occasional wheezing with exertion. Cough with yellow sputum.    HPI The patient is a 72 year old former smoker (quit 2012, 42 PY) who follows here for the issue of dyspnea on exertion in the setting of emphysema with preserved FEV1.  She also has chronic respiratory failure with hypoxia related to emphysema. This is a scheduled follow-up visit.  Last seen on 17 December 2021, at that time we had obtain a PET/CT for a lung nodule measuring 1.2 cm in the periphery of the right upper lobe.  PET/CT was positive, because of her high risk for invasive procedures (i.e. high risk for pneumothorax and comorbidity from same) she was referred directly for empiric SBRT (she underwent this therapy without difficulty.  She is compliant with oxygen with activity and sleep.  She continues to do well with this.  Since her last visit she has had difficulties with debilitating vertigo.  He has also been noted to have 80% stenosis of the right carotid.  She is to have endarterectomy.  She has a prior history of complete heart block and is status post pacemaker placement had pacer revision in October 2021 by Dr. Caryl Comes.  On today's device check she was noted to have some atrial arrhythmias and PAF and has been now started on Eliquis.  She is compliant with Stiolto and as needed albuterol.  Has not had any fevers, chills or sweats.  No calf tenderness or lower extremity edema.  No sleep disturbance.  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  She is not on inhaled corticosteroids.  She is being managed with PPI for gastroesophageal reflux.  She needs a refill on her as needed albuterol.  Otherwise she notes no issues.  Review of Systems A 10 point review of systems was performed and it is as noted above otherwise negative.  Patient Active Problem List   Diagnosis Date Noted   Carotid stenosis, symptomatic w/o infarct 03/23/2022   Vertigo 02/21/2022   Fall at home 11/25/2021   Right shoulder pain 11/25/2021   Mild anemia 11/03/2021   Fall 09/23/2021   Thyroid nodule 09/17/2021   Hyponatremia 09/17/2021   Sepsis due to gram-negative UTI ([place]) 09/05/2021   Dyslipidemia 09/05/2021   Depression 09/05/2021   GERD without esophagitis 09/05/2021   Vitamin D deficiency 08/20/2021   Colon cancer screening 07/30/2021   Hypercalcemia 07/29/2021   Diarrhea 07/29/2021   UTI (urinary tract infection) 07/17/2021   AKI (acute kidney injury) ([place]) 07/17/2021   Dysuria 07/08/2021   Arterial hypotension 07/08/2021   Paroxysmal atrial fibrillation ([place]) 04/11/2021   Current use of proton pump inhibitor 10/24/2020   Sleep apnea 10/24/2020   Fatigue 10/24/2020   Lightheadedness 05/16/2020   Radicular pain in left arm 03/02/2020   Neck pain on left side 02/15/2020  Paresthesia 02/15/2020   Chronic respiratory failure with hypoxia (HCC) 11/17/2019   Chronic heart failure with preserved ejection fraction (HFpEF) ([place]) 08/11/2019   Coronary artery disease involving native coronary artery of native heart without angina pectoris 06/20/2019   Gastroesophageal reflux disease    Stomach irritation    Abnormal CT scan, esophagus    Polyp of ascending colon    Personal history of tobacco use, presenting hazards to health 01/18/2018   Grade I diastolic  dysfunction 42/68/3419   Globus pharyngeus 12/21/2017   Chronic obstructive pulmonary disease ([place]) 12/15/2017   Hyperlipidemia LDL goal <70 12/07/2017   Seizures ([place]) 10/01/2016   Elevated glucose 08/31/2016   Grief reaction 04/11/2016   History of breast cancer 12/11/2015   Osteoporosis 11/22/2015   Estrogen deficiency 09/07/2015   Bruising 09/07/2015   Wrinkles 09/07/2015   Viral URI with cough 05/22/2015   Post herpetic neuralgia 09/01/2014   Encounter for Medicare annual wellness exam 07/11/2014   Routine general medical examination at a health care facility 06/26/2013   H/O small bowel obstruction 04/15/2013   Dyspnea on exertion 02/10/2013   Complete heart block ([place]) 11/03/2011   Pacemaker-[employer] 11/03/2011   Lumbar spinal stenosis 09/02/2011   POSTMENOPAUSAL STATUS 08/06/2009   HIDRADENITIS SUPPURATIVA 06/26/2008   HERNIATED DISC 12/14/2007   BACK, LOWER, PAIN 12/02/2007   Hyperkalemia 08/06/2007   HX, PERSONAL, COLONIC POLYPS 09/14/2006   Former smoker 06/10/2006   ASTHMA 06/10/2006   H/O idiopathic seizure 06/10/2006   Social History   Tobacco Use   Smoking status: Former    Packs/day: 1.50    Years: 42.00    Total pack years: 63.00    Types: Cigarettes    Quit date: 11/11/2010    Years since quitting: 11.4   Smokeless tobacco: Never  Substance Use Topics   Alcohol use: Yes    Alcohol/week: 1.0 - 2.0 standard drink of alcohol    Types: 1 - 2 Glasses of wine per week    Comment: weekly   Allergies  Allergen Reactions   Codeine Nausea And Vomiting   Morphine And Related Nausea Only   Current Meds  Medication Sig   acetaminophen (TYLENOL) 500 MG tablet Take 500-1,000 mg by mouth every 6 (six) hours as needed for mild pain or headache.    albuterol (VENTOLIN HFA) 108 (90 Base) MCG/ACT inhaler Inhale 2 puffs into the lungs every 4 (four) hours as needed for wheezing or shortness of breath.   apixaban (ELIQUIS) 5 MG TABS tablet Take 1 tablet by  mouth twice daily   aspirin EC 81 MG tablet Take 81 mg by mouth daily. Swallow whole.   atorvastatin (LIPITOR) 40 MG tablet Take 1 tablet by mouth once daily   benzonatate (TESSALON) 100 MG capsule Take by mouth as needed.   FLUoxetine (PROZAC) 20 MG capsule Take 1 capsule by mouth once daily   furosemide (LASIX) 40 MG tablet Take 1 tablet (40 mg total) by mouth daily as needed (swelling, wt gain or needing more oxygen).   gabapentin (NEURONTIN) 100 MG capsule Take 100 mg by mouth as needed.   levETIRAcetam (KEPPRA) 250 MG tablet Take 250 mg by mouth 2 (two) times daily.    omeprazole (PRILOSEC) 40 MG capsule Take 40 mg by mouth daily.   ondansetron (ZOFRAN) 4 MG tablet Take 1 tablet (4 mg total) by mouth every 8 (eight) hours as needed for nausea or vomiting.   OXYGEN Inhale 4 L into the lungs continuous.  STIOLTO RESPIMAT 2.5-2.5 MCG/ACT AERS INHALE 2 PUFFS BY MOUTH ONCE DAILY   VITAMIN D PO Take 1 tablet by mouth daily.   zolpidem (AMBIEN) 10 MG tablet TAKE 1/2 TO 1 (ONE-HALF TO ONE) TABLET BY MOUTH AT BEDTIME AS NEEDED FOR SLEEP   Immunization History  Administered Date(s) Administered   Fluad Quad(high Dose 65+) 10/24/2020   Influenza Whole 02/24/2005   Influenza, High Dose Seasonal PF 11/04/2018, 11/22/2019   Influenza,inj,Quad PF,6+ Mos 12/03/2012, 12/09/2013, 01/16/2015, 02/06/2016, 11/04/2016, 12/08/2017   PFIZER Comirnaty(Gray Top)Covid-19 Tri-Sucrose Vaccine 07/06/2020   PFIZER(Purple Top)SARS-COV-2 Vaccination 02/28/2019, 03/21/2019, 11/14/2019   Pneumococcal Conjugate-13 07/11/2014   Pneumococcal Polysaccharide-23 03/21/2005, 07/26/2015   Td 01/09/1999, 06/14/2007   Zoster, Live 09/07/2015      Objective:   Physical Exam BP 120/78 (BP Location: Right Arm, Cuff Size: Normal)   Pulse 68   Temp (!) 97.5 F (36.4 C)   Ht 5\' 8"  (1.727 m)   Wt 189 lb (85.7 kg)   SpO2 95%   BMI 28.74 kg/m   SpO2: 95 % O2 Device: Nasal cannula O2 Flow Rate (L/min): 4 L/min  GENERAL:  Well-developed well-nourished woman in no acute respiratory distress.  She is fully ambulatory.  No conversational dyspnea.  She is less hoarse today. HEAD: Normocephalic, atraumatic. EYES: Pupils equal, round, reactive to light.  No scleral icterus. MOUTH: Tissue intact.  No thrush. NECK: Supple. No thyromegaly. No nodules. No JVD.  Trachea midline. PULMONARY: Symmetrical air entry, clear with no adventitious sounds CARDIOVASCULAR: S1 and S2. Regular rate and rhythm. No rubs murmurs gallops heard. GASTROINTESTINAL: Nondistended. MUSCULOSKELETAL: No joint deformity, no clubbing, no edema. NEUROLOGIC: Awake, alert, oriented x4, no focal deficits. SKIN: Intact,warm,dry, on limited exam no rashes. PSYCH: Normal mood and behavior.     Assessment & Plan:     ICD-10-CM   1. Centrilobular emphysema (HCC)  J43.2    Continue Stiolto 2 puffs daily Continue as needed albuterol    2. Chronic respiratory failure with hypoxia (HCC)  J96.11    Compliant with oxygen at 4 L/min Continue same    3. 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 Stenosis of right carotid artery  I65.21    Patient being managed by vascular surgery     Meds ordered this encounter  Medications   albuterol (VENTOLIN HFA) 108 (90 Base) MCG/ACT inhaler    Sig: Inhale 2 puffs into the lungs every 4 (four) hours as needed for wheezing or shortness of breath.    Dispense:  18 g    Refill:  1   See the patient in follow-up in 3 to 4 months time she is to contact us prior to that time should any new difficulties arise.  Renold Don, MD Advanced Bronchoscopy PCCM [facility]    *This note was dictated using voice recognition software/Dragon.  Despite best efforts to proofread, errors can occur which can change the meaning. Any  transcriptional errors that result from this process are unintentional and may not be fully corrected at the time of dictation.

## 2022-04-21 ENCOUNTER — Telehealth: Payer: Self-pay | Admitting: Family Medicine

## 2022-04-21 ENCOUNTER — Encounter (INDEPENDENT_AMBULATORY_CARE_PROVIDER_SITE_OTHER): Payer: Self-pay | Admitting: Vascular Surgery

## 2022-04-21 NOTE — Telephone Encounter (Signed)
Pt called in requesting a call back to discuss issues with getting a stent place in her neck . Please advise [phone]

## 2022-04-22 NOTE — Telephone Encounter (Signed)
Called pt for more info. Pt said she recently had a visit with Dr. Delana Meyer (Vascular doc) and he did a work up and told her that her right Artery is clogged 80% and she needed a Stent asap. After that she never heard anything back from them regarding setting up the stent. She said she has called his office multiple times and the assistant isn't really helpful she says that she is waiting for pt's insurance to authorize the surgery. Pt said that was days ago and she has called multiple times and they are not giving her any updates and pt said the doctor made it seem like she needs this stent asap. Pt is afraid she will have a stroke or worse if it's not done asap. Pt said she called her insurance directly and talked to 4 different people and they all confirmed that they haven't received any request from Vascular to authorize a surgery so now pt is frustrated and not sure what to do so she is reaching out to PCP to see if she can lend any help in this situation.

## 2022-04-22 NOTE — Telephone Encounter (Signed)
I think this is [facility]  Is there anything we can do to move this along or do you think it is an insurance waiting game? Sending to Caryl Pina- would appreciate any help or guidance

## 2022-04-23 NOTE — Telephone Encounter (Signed)
Any progress on this or ideas? Will send to [place] as well   Thanks

## 2022-04-24 ENCOUNTER — Telehealth (INDEPENDENT_AMBULATORY_CARE_PROVIDER_SITE_OTHER): Payer: Self-pay

## 2022-04-24 NOTE — Telephone Encounter (Signed)
Spoke with the patient and she is scheduled with Dr. Delana Meyer for a right carotid stent placement on 04/29/22 with a 6:45 am arrival time to the [facility]. Pre-procedure instructions were discussed and will be sent to her Mychart and mailed.

## 2022-04-24 NOTE — Telephone Encounter (Signed)
I spoke with Diana Cook and AVV.  She reported that she has sent the information to [employer] and is waiting to show as "received."  She checks the status every 2 days with the insurance company.  She reported that she will notify pt when the procedure has been approved/denied.  Called and spoke with pt to let her know that I had spoke with Diana Cook at the office.  She verbalized frustration that she needs this procedure so that she doesn't have a stroke but it hasn't been approved yet.  Apologized to pt for the wait and practiced active listening.  Encouraged her to call the office if she has any other questions or concerns.

## 2022-04-29 ENCOUNTER — Encounter: Payer: Self-pay | Admitting: Vascular Surgery

## 2022-04-29 ENCOUNTER — Encounter
Admission: RE | Disposition: A | Discharge status: Home / Self Care | Payer: Self-pay | Source: Home / Self Care | Attending: Vascular Surgery

## 2022-04-29 ENCOUNTER — Other Ambulatory Visit: Payer: Self-pay

## 2022-04-29 ENCOUNTER — Inpatient Hospital Stay
Admission: RE | Admit: 2022-04-29 | Discharge: 2022-04-30 | DRG: 036 | Disposition: A | Discharge status: Home / Self Care | Payer: Medicare Other | Attending: Vascular Surgery | Admitting: Vascular Surgery

## 2022-04-29 DIAGNOSIS — Z9981 Dependence on supplemental oxygen: Secondary | ICD-10-CM | POA: Diagnosis not present

## 2022-04-29 DIAGNOSIS — Z923 Personal history of irradiation: Secondary | ICD-10-CM

## 2022-04-29 DIAGNOSIS — I442 Atrioventricular block, complete: Secondary | ICD-10-CM | POA: Diagnosis present

## 2022-04-29 DIAGNOSIS — Z853 Personal history of malignant neoplasm of breast: Secondary | ICD-10-CM | POA: Diagnosis not present

## 2022-04-29 DIAGNOSIS — E785 Hyperlipidemia, unspecified: Secondary | ICD-10-CM | POA: Diagnosis present

## 2022-04-29 DIAGNOSIS — J4489 Other specified chronic obstructive pulmonary disease: Secondary | ICD-10-CM | POA: Diagnosis present

## 2022-04-29 DIAGNOSIS — Z87891 Personal history of nicotine dependence: Secondary | ICD-10-CM | POA: Diagnosis not present

## 2022-04-29 DIAGNOSIS — I10 Essential (primary) hypertension: Secondary | ICD-10-CM | POA: Diagnosis present

## 2022-04-29 DIAGNOSIS — Z86010 Personal history of colonic polyps: Secondary | ICD-10-CM

## 2022-04-29 DIAGNOSIS — G40909 Epilepsy, unspecified, not intractable, without status epilepticus: Secondary | ICD-10-CM | POA: Diagnosis present

## 2022-04-29 DIAGNOSIS — Z803 Family history of malignant neoplasm of breast: Secondary | ICD-10-CM

## 2022-04-29 DIAGNOSIS — I2510 Atherosclerotic heart disease of native coronary artery without angina pectoris: Secondary | ICD-10-CM | POA: Diagnosis present

## 2022-04-29 DIAGNOSIS — Z8249 Family history of ischemic heart disease and other diseases of the circulatory system: Secondary | ICD-10-CM

## 2022-04-29 DIAGNOSIS — Z90710 Acquired absence of both cervix and uterus: Secondary | ICD-10-CM | POA: Diagnosis not present

## 2022-04-29 DIAGNOSIS — I6523 Occlusion and stenosis of bilateral carotid arteries: Secondary | ICD-10-CM | POA: Diagnosis present

## 2022-04-29 DIAGNOSIS — Z7901 Long term (current) use of anticoagulants: Secondary | ICD-10-CM | POA: Diagnosis not present

## 2022-04-29 DIAGNOSIS — I480 Paroxysmal atrial fibrillation: Secondary | ICD-10-CM | POA: Diagnosis present

## 2022-04-29 DIAGNOSIS — I252 Old myocardial infarction: Secondary | ICD-10-CM

## 2022-04-29 DIAGNOSIS — Z885 Allergy status to narcotic agent status: Secondary | ICD-10-CM | POA: Diagnosis not present

## 2022-04-29 DIAGNOSIS — Z7982 Long term (current) use of aspirin: Secondary | ICD-10-CM | POA: Diagnosis not present

## 2022-04-29 DIAGNOSIS — Z823 Family history of stroke: Secondary | ICD-10-CM

## 2022-04-29 DIAGNOSIS — I6521 Occlusion and stenosis of right carotid artery: Principal | ICD-10-CM | POA: Diagnosis present

## 2022-04-29 DIAGNOSIS — M8580 Other specified disorders of bone density and structure, unspecified site: Secondary | ICD-10-CM | POA: Diagnosis present

## 2022-04-29 DIAGNOSIS — Z950 Presence of cardiac pacemaker: Secondary | ICD-10-CM | POA: Diagnosis not present

## 2022-04-29 DIAGNOSIS — Z8673 Personal history of transient ischemic attack (TIA), and cerebral infarction without residual deficits: Secondary | ICD-10-CM | POA: Diagnosis not present

## 2022-04-29 HISTORY — PX: CAROTID PTA/STENT INTERVENTION: CATH118231

## 2022-04-29 LAB — CREATININE, SERUM
Creatinine, Ser: 1.05 mg/dL — ABNORMAL HIGH (ref 0.44–1.00)
GFR, Estimated: 56 mL/min — ABNORMAL LOW (ref >60)

## 2022-04-29 LAB — MRSA NEXT GEN BY PCR, NASAL: MRSA by PCR Next Gen: NOT DETECTED

## 2022-04-29 LAB — BUN: BUN: 30 mg/dL — ABNORMAL HIGH (ref 8–23)

## 2022-04-29 LAB — POCT ACTIVATED CLOTTING TIME: Activated Clotting Time: 288 seconds

## 2022-04-29 LAB — GLUCOSE, CAPILLARY: Glucose-Capillary: 111 mg/dL — ABNORMAL HIGH (ref 70–99)

## 2022-04-29 SURGERY — CAROTID PTA/STENT INTERVENTION
Anesthesia: Moderate Sedation | Laterality: Right

## 2022-04-29 MED ORDER — MIDAZOLAM HCL 5 MG/5ML IJ SOLN
INTRAMUSCULAR | Status: AC
  Filled 2022-04-29: qty 5

## 2022-04-29 MED ORDER — METOPROLOL TARTRATE 5 MG/5ML IV SOLN: 2.0000 mg | INTRAVENOUS | Status: DC | PRN

## 2022-04-29 MED ORDER — CEFAZOLIN SODIUM-DEXTROSE 2-4 GM/100ML-% IV SOLN: 2.0000 g | INTRAVENOUS | Status: DC

## 2022-04-29 MED ORDER — FAMOTIDINE 20 MG PO TABS: 40.0000 mg | ORAL_TABLET | Freq: Once | ORAL | Status: DC | PRN

## 2022-04-29 MED ORDER — ARFORMOTEROL TARTRATE 15 MCG/2ML IN NEBU
15.0000 ug | INHALATION_SOLUTION | Freq: Two times a day (BID) | RESPIRATORY_TRACT | Status: DC
  Administered 2022-04-29 – 2022-04-30 (×2): 15 ug via RESPIRATORY_TRACT
  Filled 2022-04-29 (×3): qty 2

## 2022-04-29 MED ORDER — ASPIRIN 81 MG PO TBEC
DELAYED_RELEASE_TABLET | ORAL | Status: AC
  Filled 2022-04-29: qty 1

## 2022-04-29 MED ORDER — PHENOL 1.4 % MT LIQD: 1.0000 | OROMUCOSAL | Status: DC | PRN

## 2022-04-29 MED ORDER — HEPARIN SODIUM (PORCINE) 1000 UNIT/ML IJ SOLN
INTRAMUSCULAR | Status: AC
  Filled 2022-04-29: qty 10

## 2022-04-29 MED ORDER — LEVETIRACETAM 250 MG PO TABS
250.0000 mg | ORAL_TABLET | Freq: Two times a day (BID) | ORAL | Status: DC
  Administered 2022-04-29 – 2022-04-30 (×3): 250 mg via ORAL
  Filled 2022-04-29 (×3): qty 1

## 2022-04-29 MED ORDER — ALUM & MAG HYDROXIDE-SIMETH 200-200-20 MG/5ML PO SUSP: 15.0000 mL | ORAL | Status: DC | PRN

## 2022-04-29 MED ORDER — FENTANYL CITRATE (PF) 100 MCG/2ML IJ SOLN
INTRAMUSCULAR | Status: DC | PRN
  Administered 2022-04-29: 50 ug via INTRAVENOUS

## 2022-04-29 MED ORDER — ZOLPIDEM TARTRATE 5 MG PO TABS
5.0000 mg | ORAL_TABLET | Freq: Every evening | ORAL | Status: DC | PRN
  Administered 2022-04-29: 5 mg via ORAL
  Filled 2022-04-29: qty 1

## 2022-04-29 MED ORDER — ACETAMINOPHEN 325 MG RE SUPP: 325.0000 mg | RECTAL | Status: DC | PRN

## 2022-04-29 MED ORDER — SODIUM CHLORIDE 0.9 % IV SOLN: 500.0000 mL | Freq: Once | INTRAVENOUS | Status: DC | PRN

## 2022-04-29 MED ORDER — FENTANYL CITRATE (PF) 100 MCG/2ML IJ SOLN
INTRAMUSCULAR | Status: AC
  Filled 2022-04-29: qty 2

## 2022-04-29 MED ORDER — ATROPINE SULFATE 1 MG/10ML IJ SOSY
PREFILLED_SYRINGE | INTRAMUSCULAR | Status: AC
  Filled 2022-04-29: qty 20

## 2022-04-29 MED ORDER — CEFAZOLIN SODIUM-DEXTROSE 2-4 GM/100ML-% IV SOLN
INTRAVENOUS | Status: AC
  Filled 2022-04-29: qty 100

## 2022-04-29 MED ORDER — CLOPIDOGREL BISULFATE 75 MG PO TABS
ORAL_TABLET | ORAL | Status: AC
  Filled 2022-04-29: qty 1

## 2022-04-29 MED ORDER — MECLIZINE HCL 25 MG PO TABS: 12.5000 mg | ORAL_TABLET | Freq: Three times a day (TID) | ORAL | Status: DC | PRN

## 2022-04-29 MED ORDER — BISACODYL 5 MG PO TBEC: 5.0000 mg | DELAYED_RELEASE_TABLET | Freq: Every day | ORAL | Status: DC | PRN

## 2022-04-29 MED ORDER — KCL IN DEXTROSE-NACL 20-5-0.9 MEQ/L-%-% IV SOLN
INTRAVENOUS | Status: DC
  Filled 2022-04-29 (×3): qty 1000

## 2022-04-29 MED ORDER — FUROSEMIDE 20 MG PO TABS: 40.0000 mg | ORAL_TABLET | Freq: Every day | ORAL | Status: DC | PRN

## 2022-04-29 MED ORDER — GABAPENTIN 100 MG PO CAPS
100.0000 mg | ORAL_CAPSULE | Freq: Three times a day (TID) | ORAL | Status: DC
  Administered 2022-04-29 – 2022-04-30 (×3): 100 mg via ORAL
  Filled 2022-04-29 (×3): qty 1

## 2022-04-29 MED ORDER — UMECLIDINIUM BROMIDE 62.5 MCG/ACT IN AEPB: 1.0000 | INHALATION_SPRAY | Freq: Every day | RESPIRATORY_TRACT | Status: DC

## 2022-04-29 MED ORDER — CEFAZOLIN SODIUM-DEXTROSE 2-4 GM/100ML-% IV SOLN
2.0000 g | Freq: Three times a day (TID) | INTRAVENOUS | Status: AC
  Administered 2022-04-29 (×2): 2 g via INTRAVENOUS
  Filled 2022-04-29 (×2): qty 100

## 2022-04-29 MED ORDER — MIDAZOLAM HCL 2 MG/2ML IJ SOLN
INTRAMUSCULAR | Status: DC | PRN
  Administered 2022-04-29: .5 mg via INTRAVENOUS
  Administered 2022-04-29: 2 mg via INTRAVENOUS

## 2022-04-29 MED ORDER — ACETAMINOPHEN 325 MG PO TABS
325.0000 mg | ORAL_TABLET | ORAL | Status: DC | PRN
  Administered 2022-04-29 – 2022-04-30 (×2): 650 mg via ORAL
  Filled 2022-04-29 (×2): qty 2

## 2022-04-29 MED ORDER — IODIXANOL 320 MG/ML IV SOLN
INTRAVENOUS | Status: DC | PRN
  Administered 2022-04-29: 55 mL

## 2022-04-29 MED ORDER — HYDROMORPHONE HCL 1 MG/ML IJ SOLN
0.5000 mg | INTRAMUSCULAR | Status: DC | PRN
  Filled 2022-04-29: qty 1

## 2022-04-29 MED ORDER — OXYCODONE HCL 5 MG PO TABS
5.0000 mg | ORAL_TABLET | ORAL | Status: DC | PRN
  Administered 2022-04-29: 5 mg via ORAL
  Filled 2022-04-29: qty 1

## 2022-04-29 MED ORDER — PANTOPRAZOLE SODIUM 40 MG IV SOLR
40.0000 mg | Freq: Every day | INTRAVENOUS | Status: DC
  Administered 2022-04-29: 40 mg via INTRAVENOUS
  Filled 2022-04-29: qty 10

## 2022-04-29 MED ORDER — DIPHENHYDRAMINE HCL 50 MG/ML IJ SOLN: 50.0000 mg | Freq: Once | INTRAMUSCULAR | Status: DC | PRN

## 2022-04-29 MED ORDER — HYDROMORPHONE HCL 1 MG/ML IJ SOLN: 1.0000 mg | Freq: Once | INTRAMUSCULAR | Status: DC | PRN

## 2022-04-29 MED ORDER — METHYLPREDNISOLONE SODIUM SUCC 125 MG IJ SOLR: 125.0000 mg | Freq: Once | INTRAMUSCULAR | Status: DC | PRN

## 2022-04-29 MED ORDER — SODIUM CHLORIDE 0.9 % IV SOLN: INTRAVENOUS | Status: DC

## 2022-04-29 MED ORDER — CEFAZOLIN SODIUM-DEXTROSE 1-4 GM/50ML-% IV SOLN
INTRAVENOUS | Status: AC | PRN
  Administered 2022-04-29: 2 g via INTRAVENOUS

## 2022-04-29 MED ORDER — ALBUTEROL SULFATE (2.5 MG/3ML) 0.083% IN NEBU: 3.0000 mL | INHALATION_SOLUTION | RESPIRATORY_TRACT | Status: DC | PRN

## 2022-04-29 MED ORDER — ASPIRIN 81 MG PO TBEC
81.0000 mg | DELAYED_RELEASE_TABLET | Freq: Every day | ORAL | Status: DC
  Administered 2022-04-29 – 2022-04-30 (×2): 81 mg via ORAL
  Filled 2022-04-29: qty 1

## 2022-04-29 MED ORDER — LABETALOL HCL 5 MG/ML IV SOLN: 10.0000 mg | INTRAVENOUS | Status: DC | PRN

## 2022-04-29 MED ORDER — ONDANSETRON HCL 4 MG/2ML IJ SOLN: 4.0000 mg | Freq: Four times a day (QID) | INTRAMUSCULAR | Status: DC | PRN

## 2022-04-29 MED ORDER — HEPARIN SODIUM (PORCINE) 1000 UNIT/ML IJ SOLN
INTRAMUSCULAR | Status: DC | PRN
  Administered 2022-04-29: 1000 [IU] via INTRAVENOUS
  Administered 2022-04-29: 10000 [IU] via INTRAVENOUS

## 2022-04-29 MED ORDER — DOCUSATE SODIUM 100 MG PO CAPS: 100.0000 mg | ORAL_CAPSULE | Freq: Every day | ORAL | Status: DC

## 2022-04-29 MED ORDER — PHENYLEPHRINE 80 MCG/ML (10ML) SYRINGE FOR IV PUSH (FOR BLOOD PRESSURE SUPPORT)
PREFILLED_SYRINGE | INTRAVENOUS | Status: AC
  Filled 2022-04-29: qty 10

## 2022-04-29 MED ORDER — POLYETHYLENE GLYCOL 3350 17 G PO PACK: 17.0000 g | PACK | Freq: Every day | ORAL | Status: DC | PRN

## 2022-04-29 MED ORDER — FLUOXETINE HCL 20 MG PO CAPS
20.0000 mg | ORAL_CAPSULE | Freq: Every day | ORAL | Status: DC
  Administered 2022-04-29 – 2022-04-30 (×2): 20 mg via ORAL
  Filled 2022-04-29 (×2): qty 1

## 2022-04-29 MED ORDER — HYDRALAZINE HCL 20 MG/ML IJ SOLN: 5.0000 mg | INTRAMUSCULAR | Status: DC | PRN

## 2022-04-29 MED ORDER — APIXABAN 5 MG PO TABS
5.0000 mg | ORAL_TABLET | Freq: Two times a day (BID) | ORAL | Status: DC
  Administered 2022-04-29 – 2022-04-30 (×3): 5 mg via ORAL
  Filled 2022-04-29 (×3): qty 1

## 2022-04-29 MED ORDER — ATORVASTATIN CALCIUM 20 MG PO TABS
40.0000 mg | ORAL_TABLET | Freq: Every day | ORAL | Status: DC
  Administered 2022-04-29 – 2022-04-30 (×2): 40 mg via ORAL
  Filled 2022-04-29 (×2): qty 2

## 2022-04-29 MED ORDER — GUAIFENESIN-DM 100-10 MG/5ML PO SYRP: 15.0000 mL | ORAL_SOLUTION | ORAL | Status: DC | PRN

## 2022-04-29 MED ORDER — CLOPIDOGREL BISULFATE 75 MG PO TABS
75.0000 mg | ORAL_TABLET | Freq: Every day | ORAL | Status: DC
  Administered 2022-04-29 – 2022-04-30 (×2): 75 mg via ORAL
  Filled 2022-04-29: qty 1

## 2022-04-29 MED ORDER — DOPAMINE-DEXTROSE 3.2-5 MG/ML-% IV SOLN
INTRAVENOUS | Status: AC
  Filled 2022-04-29: qty 250

## 2022-04-29 MED ORDER — MIDAZOLAM HCL 2 MG/ML PO SYRP: 8.0000 mg | ORAL_SOLUTION | Freq: Once | ORAL | Status: DC | PRN

## 2022-04-29 SURGICAL SUPPLY — 22 items
BALLN VTRAC 4.5X30X135 (BALLOONS) ×1
BALLOON VTRAC 4.5X30X135 (BALLOONS) IMPLANT
CATH ANGIO 5F PIGTAIL 100CM (CATHETERS) IMPLANT
CATH BEACON 5 .035 100 H1 TIP (CATHETERS) IMPLANT
COVER DRAPE FLUORO 36X44 (DRAPES) IMPLANT
DEVICE EMBOSHIELD NAV6 4.0-7.0 (FILTER) IMPLANT
DEVICE SAFEGUARD 24CM (GAUZE/BANDAGES/DRESSINGS) IMPLANT
DEVICE STARCLOSE SE CLOSURE (Vascular Products) IMPLANT
DEVICE TORQUE .025-.038 (MISCELLANEOUS) IMPLANT
GLIDEWIRE ANGLED SS 035X260CM (WIRE) IMPLANT
GUIDEWIRE VASC STIFF .038X260 (WIRE) IMPLANT
KIT ENCORE 26 ADVANTAGE (KITS) IMPLANT
NDL ENTRY 21GA 7CM ECHOTIP (NEEDLE) IMPLANT
NEEDLE ENTRY 21GA 7CM ECHOTIP (NEEDLE) ×1 IMPLANT
PACK ANGIOGRAPHY (CUSTOM PROCEDURE TRAY) ×1 IMPLANT
SET INTRO CAPELLA COAXIAL (SET/KITS/TRAYS/PACK) IMPLANT
SHEATH BRITE TIP 6FRX11 (SHEATH) IMPLANT
SHEATH NEURON MAX 6FR 80CM (SHEATH) IMPLANT
STENT XACT CAR 9-7X40X136 (Permanent Stent) IMPLANT
SYR MEDRAD MARK 7 150ML (SYRINGE) IMPLANT
TUBING CONTRAST HIGH PRESS 72 (TUBING) IMPLANT
WIRE GUIDERIGHT .035X150 (WIRE) IMPLANT

## 2022-04-29 NOTE — Interval H&P Note (Signed)
History and Physical Interval Note:  04/29/2022 8:02 AM  Diana Cook  has presented today for surgery, with the diagnosis of R Carotid Stent   [place]   Carotid artery stenosis.  The various methods of treatment have been discussed with the patient and family. After consideration of risks, benefits and other options for treatment, the patient has consented to  Procedure(s): CAROTID PTA/STENT INTERVENTION (Right) as a surgical intervention.  The patient's history has been reviewed, patient examined, no change in status, stable for surgery.  I have reviewed the patient's chart and labs.  Questions were answered to the patient's satisfaction.     Hortencia Pilar

## 2022-04-29 NOTE — Op Note (Signed)
OPERATIVE NOTE DATE: 04/29/2022  PROCEDURE:  Ultrasound guidance for vascular access right common femoral artery  Placement of a 9 x 7 x 40 exact stent with the use of the NAV-6 embolic protection device in the right internal carotid artery  PRE-OPERATIVE DIAGNOSIS: 1.  Symptomatic right internal carotid artery stenosis. 2.  TIA  POST-OPERATIVE DIAGNOSIS:  Same as above  SURGEON: Hortencia Pilar  ASSISTANT(S): None  ANESTHESIA: local/MCS  ESTIMATED BLOOD LOSS: 50 cc  CONTRAST: 55 cc  FLUORO TIME: 7.7 minutes  MODERATE CONSCIOUS SEDATION TIME: Continuous ECG pulse oximetry and cardiopulmonary monitoring was performed throughout the entire procedure by the interventional radiology nurse.  Parenteral Versed and fentanyl were administered.  Total sedation time was 47 minutes and 1 second  FINDING(S): 1.   Greater than 80% stenosis associated with an ulcerated area right internal carotid artery.  SPECIMEN(S):   none  INDICATIONS:   Patient is a 72 y.o. female who presents with symptomatic right internal carotid artery stenosis.  The patient has advanced COPD and is on 4 L of oxygen continuously.  And carotid artery stenting was felt to be preferred to endarterectomy for that reason.  Risks and benefits were discussed and informed consent was obtained.   DESCRIPTION: After obtaining full informed written consent, the patient was brought back to the vascular suite and placed supine upon the table.  The patient received IV antibiotics prior to induction. Moderate conscious sedation was administered during a face to face encounter with the patient throughout the procedure with my supervision of the RN administering medicines and monitoring the patients vital signs and mental status throughout from the start of the procedure until the patient was taken to the recovery room.    After obtaining adequate sedation, the patient was prepped and draped in the standard fashion.    A first  assistant is required in order to allow for a safe and more efficient operation.  Duties include wire manipulations as well as assistance with pinning the sheath and positioning the detector for proper angle, assistance and deploying the stent in the proper position and appropriate images.  Further duties include assisting with patient positioning during the procedure.  I believe that this procedure requires a first assistant in order for it to be performed at a level in keeping with the high standards of this institution.  The right femoral artery was visualized with ultrasound and found to be widely patent. It was then accessed under direct ultrasound guidance without difficulty with a micropuncture needle. A permanent image was recorded.  A microwire was then advanced without difficulty under fluoroscopic guidance followed by a micro-sheath.  A J-wire was placed and we then placed a 6 French sheath. The patient was then heparinized and a total of 10,000 units of intravenous heparin were given and an ACT was checked to confirm successful anticoagulation, an additional 1000 units was given after the ACT was reported.   A pigtail catheter was then placed into the ascending aorta. This showed type IIb arch no evidence of ostial stenosis of the great vessels. The right common carotid artery was then selectively cannulated without difficulty with a H1 catheter and the catheter advanced into the mid right common carotid artery.  Cervical and cerebral carotid angiography was then performed. There were no obvious intracranial filling defects. The carotid bifurcation demonstrated ulcerated area at the origin of the right internal carotid artery there was also a web-like lesion best noted in the slight RAO or AP views.  This correlated with the findings on both CT angio as well as duplex ultrasound.  I then advanced into the external carotid artery with a Glidewire and the H1 catheter and then exchanged for the Amplatz  Super Stiff wire. Over the Amplatz Super Stiff wire, a 6 French neuron sheath was placed into the mid common carotid artery. I then used the large NAV-6  Embolic protection device and crossed the lesion and parked this in the distal internal carotid artery at the base of the skull.  I then selected a 9 x 7 x 40 exact stent. This was deployed across the lesion encompassing it in its entirety. A 4.5 x 30 length balloon was used to post dilate the stent. Only about a 10% residual stenosis was present after angioplasty. Completion angiogram showed normal intracranial filling without new defects. At this point I elected to terminate the procedure. The sheath was removed and StarClose closure device was deployed in the right femoral artery with excellent hemostatic result. The patient was taken to the recovery room in stable condition having tolerated the procedure well.  COMPLICATIONS: none  CONDITION: stable  Hortencia Pilar 04/29/2022 9:17 AM   This note was created with Dragon Medical transcription system. Any errors in dictation are purely unintentional.

## 2022-04-29 NOTE — Progress Notes (Signed)
Eunique C. Bode is now POD #0 from right carotid stent placement. Patient is AAOX4 and neuro intact. Answers all questions appropriately and follows commands. No signs of stroke present. Patient is resting comfortably in bed. Patients right groin with slight oozing with PAD device in place. Patient remains on 4 liters nasal cannula oxygen same as she wears at home. Patient remains pacemaker dependant. Atrial sensed and ventricular paced at 70. Vitals all remain stable. No other complaints to note.

## 2022-04-30 ENCOUNTER — Encounter: Payer: Self-pay | Admitting: Vascular Surgery

## 2022-04-30 LAB — CBC
HCT: 32.4 % — ABNORMAL LOW (ref 36.0–46.0)
Hemoglobin: 10.5 g/dL — ABNORMAL LOW (ref 12.0–15.0)
MCH: 29 pg (ref 26.0–34.0)
MCHC: 32.4 g/dL (ref 30.0–36.0)
MCV: 89.5 fL (ref 80.0–100.0)
Platelets: 208 10*3/uL (ref 150–400)
RBC: 3.62 MIL/uL — ABNORMAL LOW (ref 3.87–5.11)
RDW: 13.2 % (ref 11.5–15.5)
WBC: 5.2 10*3/uL (ref 4.0–10.5)
nRBC: 0 % (ref 0.0–0.2)

## 2022-04-30 LAB — BASIC METABOLIC PANEL
Anion gap: 5 (ref 5–15)
BUN: 23 mg/dL (ref 8–23)
CO2: 22 mmol/L (ref 22–32)
Calcium: 8.7 mg/dL — ABNORMAL LOW (ref 8.9–10.3)
Chloride: 110 mmol/L (ref 98–111)
Creatinine, Ser: 0.87 mg/dL (ref 0.44–1.00)
GFR, Estimated: >60 mL/min (ref >60)
Glucose, Bld: 107 mg/dL — ABNORMAL HIGH (ref 70–99)
Potassium: 4 mmol/L (ref 3.5–5.1)
Sodium: 137 mmol/L (ref 135–145)

## 2022-04-30 MED ORDER — CLOPIDOGREL BISULFATE 75 MG PO TABS: 75.0000 mg | ORAL_TABLET | Freq: Every day | ORAL | 0 refills | Status: DC

## 2022-04-30 MED ORDER — LIDOCAINE-EPINEPHRINE (PF) 1 %-1:200000 IJ SOLN
10.0000 mL | Freq: Once | INTRAMUSCULAR | Status: DC
  Filled 2022-04-30: qty 10

## 2022-04-30 NOTE — Progress Notes (Signed)
Patients blood pressure 96/43. Per NP Pace this is her normal. Continue to assess.

## 2022-04-30 NOTE — Progress Notes (Signed)
Patient ambulated around unit with no complications. Sitting up in chair. Per NP Pace, he removed patients PAD this am and placed lidocaine/Epi, pressure dressing. No additional bleeding. No change in patients mental status. Continue to assess.

## 2022-04-30 NOTE — Discharge Summary (Signed)
[facility]    Discharge Summary    Patient ID:  Diana Cook MRN: [id number] DOB/AGE: 1950/12/23 72 y.o.  Admit date: 04/29/2022 Discharge date: 04/30/2022 Date of Surgery: 04/29/2022 Surgeon: Surgeon(s): Schnier, Dolores Lory, MD  Admission Diagnosis: Carotid stenosis, symptomatic w/o infarct, right [I65.21]  Discharge Diagnoses:  Carotid stenosis, symptomatic w/o infarct, right [I65.21]  Secondary Diagnoses: Past Medical History:  Diagnosis Date   Allergic rhinitis    Arthritis    Asthma    as a child, mild now   Breast cancer ([place]) 12/2015   right breast cancer, lumpectomy and mammosite    Colon polyps    colonoscopy 7/08, tubular adenoma   Complete heart block ([place]) 09/2011   s/p PPM implanted in [facility]   COPD (chronic obstructive pulmonary disease) ([place])    Myocardial infarction ([place]) 2011   Osteopenia 10/2015   Pacemaker    2011   Paroxysmal atrial fibrillation ([place]) 03/2021   Incidentally detected on pacemaker interrogation   Personal history of radiation therapy 2017   right breast ca, mammosite placed   Rotator cuff tear, right 02/2022   Had an MRI in [place] "I have 2 tears."   Seizure disorder ([place])    Seizures ([place])    first one was when she was 72 years old    Small bowel obstruction ([place])    1988 and 2002   Tobacco abuse     Procedure(s): CAROTID PTA/STENT INTERVENTION  Discharged Condition: good  HPI:  Diana Cook is a 72 year old female who was admitted on 04/29/2022 for right carotid endovascular stent placement.  She has a complicated medical history which includes complete heart block requiring permanent pacemaker placement and her being paced at 70 bpm ventricularly.  She also has a history of COPD in which she wears 4 L nasal cannula oxygen at home at all times.  She is now postop day 2 and doing well.  She is recovering as expected.  Patient to be discharged home today on aspirin 81 mg daily, Plavix 75 mg daily,  and Eliquis 5 mg twice daily.  Hospital Course:  Diana Cook is a 72 y.o. female is S/P Right carotid endovascular stent placement.  Postoperatively she maintained her blood pressure and her heart rate.  She is ventricularly paced at 70 bpm constantly.  Did not require any blood pressure medications or heart rate control while here.  He did have some oozing from her right groin which was injected with lidocaine with 1-100,000 epinephrine.  Post injection bleeding and oozing had stopped a pressure dressing was applied and she was discharged home. Extubated: POD # 0 Physical Exam:  Alert notes x3, no acute distress Face: Symmetrical.  Tongue is midline. Neck: Trachea is midline.  No swelling or bruising. Cardiovascular: Regular rate and rhythm Pulmonary: Clear to auscultation bilaterally Abdomen: Soft, nontender, nondistended Right groin access: Clean dry and intact.  No swelling or drainage noted Left groin access: Clean dry and intact.  No swelling or drainage noted Left lower extremity: Thigh soft.  Calf soft.  Extremities warm distally toes.  Hard to palpate pedal pulses however the foot is warm is her good capillary refill. Right lower extremity: Thigh soft.  Calf soft.  Extremities warm distally toes.  Hard to palpate pedal pulses however the foot is warm is her good capillary refill. Neurological: No deficits noted   Post-op wounds:  clean, dry, intact or healing well  Pt. Ambulating, voiding and taking  PO diet without difficulty. Pt pain controlled with PO pain meds.  Labs:  As below  Complications: none  Consults:    Significant Diagnostic Studies: CBC Lab Results  Component Value Date   WBC 5.2 04/30/2022   HGB 10.5 (L) 04/30/2022   HCT 32.4 (L) 04/30/2022   MCV 89.5 04/30/2022   PLT 208 04/30/2022    BMET    Component Value Date/Time   NA 137 04/30/2022 0404   NA 139 11/16/2019 1433   NA 136 07/21/2013 1517   K 4.0 04/30/2022 0404   K 4.2 07/21/2013  1517   CL 110 04/30/2022 0404   CL 105 07/21/2013 1517   CO2 22 04/30/2022 0404   CO2 22 07/21/2013 1517   GLUCOSE 107 (H) 04/30/2022 0404   GLUCOSE 131 (H) 07/21/2013 1517   BUN 23 04/30/2022 0404   BUN 28 (H) 11/16/2019 1433   BUN 19 (H) 07/21/2013 1517   CREATININE 0.87 04/30/2022 0404   CREATININE 0.94 07/21/2013 1517   CALCIUM 8.7 (L) 04/30/2022 0404   CALCIUM 9.4 07/21/2013 1517   GFRNONAA >60 04/30/2022 0404   GFRNONAA >60 07/21/2013 1517   GFRAA 60 11/16/2019 1433   GFRAA >60 07/21/2013 1517   COAG Lab Results  Component Value Date   INR 1.5 (H) 09/05/2021   INR 1.4 (H) 09/04/2021   INR 1.0 04/11/2021     Disposition:  Discharge to :Home  Allergies as of 04/30/2022       Reactions   Codeine Nausea And Vomiting   Morphine And Related Nausea Only        Medication List     TAKE these medications    acetaminophen 500 MG tablet Commonly known as: TYLENOL Take 500-1,000 mg by mouth every 6 (six) hours as needed for mild pain or headache.   albuterol 108 (90 Base) MCG/ACT inhaler Commonly known as: VENTOLIN HFA Inhale 2 puffs into the lungs every 4 (four) hours as needed for wheezing or shortness of breath.   aspirin EC 81 MG tablet Take 81 mg by mouth daily. Swallow whole.   atorvastatin 40 MG tablet Commonly known as: LIPITOR Take 1 tablet by mouth once daily   benzonatate 100 MG capsule Commonly known as: TESSALON Take by mouth as needed.   clopidogrel 75 MG tablet Commonly known as: PLAVIX Take 1 tablet (75 mg total) by mouth daily.   Eliquis 5 MG Tabs tablet Generic drug: apixaban Take 1 tablet by mouth twice daily   FLUoxetine 20 MG capsule Commonly known as: PROZAC Take 1 capsule by mouth once daily   furosemide 40 MG tablet Commonly known as: LASIX Take 1 tablet (40 mg total) by mouth daily as needed (swelling, wt gain or needing more oxygen).   gabapentin 100 MG capsule Commonly known as: NEURONTIN Take 100 mg by mouth as  needed.   levETIRAcetam 250 MG tablet Commonly known as: KEPPRA Take 250 mg by mouth 2 (two) times daily.   meclizine 12.5 MG tablet Commonly known as: ANTIVERT Take 1 tablet (12.5 mg total) by mouth 3 (three) times daily as needed for dizziness.   omeprazole 40 MG capsule Commonly known as: PRILOSEC Take 40 mg by mouth daily.   ondansetron 4 MG tablet Commonly known as: Zofran Take 1 tablet (4 mg total) by mouth every 8 (eight) hours as needed for nausea or vomiting.   OXYGEN Inhale 4 L into the lungs continuous.   Stiolto Respimat 2.5-2.5 MCG/ACT Aers Generic drug: Tiotropium Bromide-Olodaterol INHALE  2 PUFFS BY MOUTH ONCE DAILY   VITAMIN D PO Take 1 tablet by mouth daily.   zolpidem 10 MG tablet Commonly known as: AMBIEN TAKE 1/2 TO 1 (ONE-HALF TO ONE) TABLET BY MOUTH AT BEDTIME AS NEEDED FOR SLEEP       Verbal and written Discharge instructions given to the patient. Wound care per Discharge AVS  Follow-up Information     Schnier, Dolores Lory, MD Follow up in 1 month(s).   Specialties: Vascular Surgery, Cardiology, Radiology, Vascular Surgery Why: With Carotid Duplex doppler Ultrasounds Contact information: [address] [place] [address] [phone]                 Signed: Drema Pry, NP  04/30/2022, 7:27 AM

## 2022-04-30 NOTE — Progress Notes (Signed)
Patient being discharged home. MD and NP aware of patients blood pressure. Manual blood pressure done per NP Pace 102/62 right arm. Patient alert and oriented. No mental status changes. Per MD patient may go home. Pressure dressing placed. NP Pace made patient aware to keep pressure dressing on (guaze and silk tape) until tomorrow and then place Band-Aid. Patient pedal pulses intact. Did inform patient if bleeding, oozing, pain, soreness, metal status changes to call MD or go to emergency department. Patient ambulated around unit with no complications, tolerating diet and making urine. Husband at bedside. Patient had no further questions.

## 2022-05-01 ENCOUNTER — Telehealth: Payer: Self-pay

## 2022-05-01 NOTE — Transitions of Care (Post Inpatient/ED Visit) (Signed)
   05/01/2022  Name: Diana Cook MRN: [id number] DOB: 1950/06/02  Today's TOC FU Call Status: Today's TOC FU Call Status:: Unsuccessul Call (1st Attempt) Unsuccessful Call (1st Attempt) Date: 05/01/22  Attempted to reach the patient regarding the most recent Inpatient/ED visit.  Follow Up Plan: Additional outreach attempts will be made to reach the patient to complete the Transitions of Care (Post Inpatient/ED visit) call.   [place] LPN [place] Advisor Direct Dial [phone]

## 2022-05-05 NOTE — Transitions of Care (Post Inpatient/ED Visit) (Signed)
   05/05/2022  Name: Diana Cook MRN: [id number] DOB: Jul 13, 1950  Today's TOC FU Call Status: Today's TOC FU Call Status:: Unsuccessful Call (2nd Attempt) Unsuccessful Call (1st Attempt) Date: 05/01/22 Unsuccessful Call (2nd Attempt) Date: 05/05/22  Attempted to reach the patient regarding the most recent Inpatient/ED visit.  Follow Up Plan: No further outreach attempts will be made at this time. We have been unable to contact the patient.  [place] LPN [place] Advisor Direct Dial [phone]

## 2022-05-12 ENCOUNTER — Other Ambulatory Visit: Payer: Self-pay | Admitting: Pulmonary Disease

## 2022-05-20 ENCOUNTER — Other Ambulatory Visit: Payer: Self-pay | Admitting: Internal Medicine

## 2022-05-20 NOTE — Telephone Encounter (Signed)
Pt last saw Dr End 02/27/22, last labs 04/30/22 Creat 0.87, age 72, weight 83.9kg, based on specified criteria pt is on appropriate dosage of Eliquis 5mg  BID for afib.  Will refill rx.

## 2022-05-20 NOTE — Telephone Encounter (Signed)
Please review

## 2022-05-21 ENCOUNTER — Other Ambulatory Visit (INDEPENDENT_AMBULATORY_CARE_PROVIDER_SITE_OTHER): Payer: Self-pay | Admitting: Vascular Surgery

## 2022-05-21 DIAGNOSIS — I6523 Occlusion and stenosis of bilateral carotid arteries: Secondary | ICD-10-CM

## 2022-06-02 ENCOUNTER — Ambulatory Visit (INDEPENDENT_AMBULATORY_CARE_PROVIDER_SITE_OTHER): Payer: Medicare Other

## 2022-06-02 ENCOUNTER — Encounter (INDEPENDENT_AMBULATORY_CARE_PROVIDER_SITE_OTHER): Payer: Self-pay | Admitting: Vascular Surgery

## 2022-06-02 ENCOUNTER — Ambulatory Visit (INDEPENDENT_AMBULATORY_CARE_PROVIDER_SITE_OTHER): Payer: Medicare Other | Admitting: Vascular Surgery

## 2022-06-02 ENCOUNTER — Ambulatory Visit
Admission: RE | Admit: 2022-06-02 | Discharge: 2022-06-02 | Disposition: A | Discharge status: Home / Self Care | Payer: Medicare Other | Source: Ambulatory Visit | Attending: Radiation Oncology | Admitting: Radiation Oncology

## 2022-06-02 VITALS — BP 134/73 | HR 66 | Resp 16 | Wt 194.4 lb

## 2022-06-02 DIAGNOSIS — I6523 Occlusion and stenosis of bilateral carotid arteries: Secondary | ICD-10-CM | POA: Diagnosis not present

## 2022-06-02 DIAGNOSIS — R911 Solitary pulmonary nodule: Secondary | ICD-10-CM | POA: Diagnosis present

## 2022-06-02 DIAGNOSIS — I6521 Occlusion and stenosis of right carotid artery: Secondary | ICD-10-CM

## 2022-06-02 MED ORDER — IOHEXOL 300 MG/ML  SOLN
75.0000 mL | Freq: Once | INTRAMUSCULAR | Status: AC | PRN
  Administered 2022-06-02: 75 mL via INTRAVENOUS

## 2022-06-02 NOTE — Progress Notes (Unsigned)
Patient ID: Diana Cook, female   DOB: 09/03/1950, 72 y.o.   MRN: [id number]  No chief complaint on file.   HPI Diana Cook is a 72 y.o. female.    The patient is seen for follow up evaluation of carotid stenosis status post right carotid stent on 04/29/2022.    Procedure:  Placement of a 9 x 7 x 40 exact stent with the use of the NAV-6 embolic protection device in the right internal carotid artery  There were no post operative problems or complications related to the surgery.  The patient denies neck or incisional pain.  The patient denies interval amaurosis fugax. There is no recent history of TIA symptoms or focal motor deficits. There is no prior documented CVA.  The patient denies headache.  The patient is taking enteric-coated aspirin 81 mg daily.  No recent shortening of the patient's walking distance or new symptoms consistent with claudication.  No history of rest pain symptoms. No new ulcers or wounds of the lower extremities have occurred.  There is no history of DVT, PE or superficial thrombophlebitis. No recent episodes of angina or shortness of breath documented.    Past Medical History:  Diagnosis Date   Allergic rhinitis    Arthritis    Asthma    as a child, mild now   Breast cancer ([facility]) 12/2015   right breast cancer, lumpectomy and mammosite    Colon polyps    colonoscopy 7/08, tubular adenoma   Complete heart block ([facility]) 09/2011   s/p PPM [facility] [place] [facility]   COPD (chronic obstructive pulmonary disease) ([facility])    Myocardial infarction ([facility]) 2011   Osteopenia 10/2015   Pacemaker    2011   Paroxysmal atrial fibrillation ([facility]) 03/2021   Incidentally detected on pacemaker interrogation   Personal history of radiation therapy 2017   right breast ca, mammosite placed   Rotator cuff tear, right 02/2022   Had an MRI in [place] "I have 2 tears."   Seizure disorder ([facility])    Seizures ([facility])    first one was when she was 72 years old    Small  bowel obstruction ([facility])    1988 and 2002   Tobacco abuse     Past Surgical History:  Procedure Laterality Date   ABDOMINAL HYSTERECTOMY     BREAST BIOPSY Right 2007   benign inflammatory changes, mass due to underwire bra   BREAST BIOPSY Left 01/02/2016   columnar cell changes without atypical hyperplasia.   BREAST BIOPSY Right 12/06/2015   rt breast mass 10:00, bx done at Dr. Arvella Nigh office, invasive ductal carcinoma   BREAST EXCISIONAL BIOPSY Left 01/02/2016   COLUMNAR CELL CHANGE AND HYPERPLASIA ASSOCIATED WITH LUMINAL AND STROMAL CALCIFICATIONS   BREAST LUMPECTOMY Right 01/02/2016   invasive mammary carcinoma, clear margins, negative LN   BREAST LUMPECTOMY WITH SENTINEL LYMPH NODE BIOPSY Right 01/02/2016   pT1c, N0; ER/ PR 100%; Her 2 neu not over expressed: BREAST LUMPECTOMY WITH SENTINEL LYMPH NODE BX;  Surgeon: Earline Mayotte, MD;  Location: [facility] ORS;  Service: General;  Laterality: Right;   CARDIAC CATHETERIZATION     CAROTID PTA/STENT INTERVENTION Right 04/29/2022   Procedure: CAROTID PTA/STENT INTERVENTION;  Surgeon: Renford Dills, MD;  Location: [facility] INVASIVE CV LAB;  Service: Cardiovascular;  Laterality: Right;   CATARACT EXTRACTION Bilateral    COLONOSCOPY  10/2015   Dr Christella Hartigan   COLONOSCOPY WITH PROPOFOL N/A 08/19/2018   Procedure: COLONOSCOPY WITH PROPOFOL;  Surgeon: Pasty Spillersahiliani, Varnita B, MD;  [facility];  Service: Endoscopy;  Laterality: N/A;   ESOPHAGOGASTRODUODENOSCOPY (EGD) WITH PROPOFOL N/A 08/19/2018   Procedure: ESOPHAGOGASTRODUODENOSCOPY (EGD) WITH PROPOFOL;  Surgeon: Pasty Spillersahiliani, Varnita B, MD;  Location: [facility] ENDOSCOPY;  Service: Endoscopy;  Laterality: N/A;   EXPLORATORY LAPAROTOMY  01/25/2001   Exploratory laparotomy, lysis of adhesions, identification of internal hernia secondary to omental adhesion. Prolonged postoperative ileus.   gyn surgery  1993   hysterectomy- form endometriosis   LAPAROSCOPY     PACEMAKER INSERTION  10/24/11   [place]  [employer] dual chamber PPM implanted by Dr Venda RodesLeask [facility] [place]   PPM GENERATOR CHANGEOUT N/A 12/05/2019   Procedure: PPM GENERATOR Janeann MerlHANGEOUT;  Surgeon: Duke SalviaKlein, Steven C, MD;  [facility];  Service: Cardiovascular;  Laterality: N/A;   RIGHT/LEFT HEART CATH AND CORONARY ANGIOGRAPHY Bilateral 07/19/2019   Procedure: RIGHT/LEFT HEART CATH AND CORONARY ANGIOGRAPHY;  Surgeon: Yvonne KendallEnd, Christopher, MD;  Location: [facility] INVASIVE CV LAB;  Service: Cardiovascular;  Laterality: Bilateral;   TEMPORARY PACEMAKER N/A 12/05/2019   Procedure: TEMPORARY PACEMAKER;  Surgeon: Duke SalviaKlein, Steven C, MD;  [facility];  Service: Cardiovascular;  Laterality: N/A;      Allergies  Allergen Reactions   Codeine Nausea And Vomiting   Morphine And Related Nausea Only    Current Outpatient Medications  Medication Sig Dispense Refill   acetaminophen (TYLENOL) 500 MG tablet Take 500-1,000 mg by mouth every 6 (six) hours as needed for mild pain or headache.      albuterol (VENTOLIN HFA) 108 (90 Base) MCG/ACT inhaler Inhale 2 puffs into the lungs every 4 (four) hours as needed for wheezing or shortness of breath. 18 g 1   apixaban (ELIQUIS) 5 MG TABS tablet Take 1 tablet by mouth twice daily 180 tablet 1   aspirin EC 81 MG tablet Take 81 mg by mouth daily. Swallow whole.     atorvastatin (LIPITOR) 40 MG tablet Take 1 tablet by mouth once daily 90 tablet 2   benzonatate (TESSALON) 100 MG capsule Take by mouth as needed. (Patient not taking: Reported on 04/29/2022)     clopidogrel (PLAVIX) 75 MG tablet Take 1 tablet (75 mg total) by mouth daily. 90 tablet 0   FLUoxetine (PROZAC) 20 MG capsule Take 1 capsule by mouth once daily 90 capsule 3   furosemide (LASIX) 40 MG tablet Take 1 tablet (40 mg total) by mouth daily as needed (swelling, wt gain or needing more oxygen). 90 tablet 0   gabapentin (NEURONTIN) 100 MG capsule Take 100 mg by mouth as needed.      levETIRAcetam (KEPPRA) 250 MG tablet Take 250 mg by mouth 2 (two) times daily.      meclizine (ANTIVERT) 12.5 MG tablet Take 1 tablet (12.5 mg total) by mouth 3 (three) times daily as needed for dizziness. (Patient not taking: Reported on 03/12/2022) 30 tablet 0   omeprazole (PRILOSEC) 40 MG capsule Take 40 mg by mouth daily.     ondansetron (ZOFRAN) 4 MG tablet Take 1 tablet (4 mg total) by mouth every 8 (eight) hours as needed for nausea or vomiting. (Patient not taking: Reported on 04/29/2022) 20 tablet 0   OXYGEN Inhale 4 L into the lungs continuous.     Tiotropium Bromide-Olodaterol (STIOLTO RESPIMAT) 2.5-2.5 MCG/ACT AERS INHALE 2 PUFFS BY MOUTH ONCE DAILY 4 g 5   VITAMIN D PO Take 1 tablet by mouth daily.     zolpidem (AMBIEN)  10 MG tablet TAKE 1/2 TO 1 (ONE-HALF TO ONE) TABLET BY MOUTH AT BEDTIME AS NEEDED FOR SLEEP 30 tablet 3   No current facility-administered medications for this visit.        Physical Exam There were no vitals taken for this visit. Gen:  WD/WN, NAD Skin: incision C/D/I; Neuro intact     Assessment/Plan: 1. Bilateral carotid artery stenosis Recommend:  The patient is s/p successful right carotid stent  Duplex ultrasound  shows <40%  stenosis.  Continue antiplatelet therapy as prescribed Continue management of CAD, HTN and Hyperlipidemia Healthy heart diet,  encouraged exercise at least 4 times per week  The patient's NIHSS score is as follows: 0 Mild: 1 - 5 Mild to Moderately Severe: 5 - 14 Severe: 15 - 24 Very Severe: >25  Follow up in 6 months with duplex ultrasound and physical exam based on the patient's carotid intervention. - VAS US CAROTID  2. Carotid stenosis, symptomatic w/o infarct, right See #1      Levora Dredge 06/02/2022, 2:36 PM   This note was created with Dragon medical transcription system.  Any errors from dictation are unintentional.

## 2022-06-04 ENCOUNTER — Encounter (INDEPENDENT_AMBULATORY_CARE_PROVIDER_SITE_OTHER): Payer: Self-pay | Admitting: Vascular Surgery

## 2022-06-05 ENCOUNTER — Ambulatory Visit (INDEPENDENT_AMBULATORY_CARE_PROVIDER_SITE_OTHER): Payer: Medicare Other

## 2022-06-05 DIAGNOSIS — I442 Atrioventricular block, complete: Secondary | ICD-10-CM

## 2022-06-05 LAB — CUP PACEART REMOTE DEVICE CHECK
Battery Remaining Longevity: 138 mo
Battery Remaining Percentage: 100 %
Brady Statistic RA Percent Paced: 2 %
Brady Statistic RV Percent Paced: 100 %
Date Time Interrogation Session: 20240411011100
Implantable Lead Connection Status: 753985
Implantable Lead Connection Status: 753985
Implantable Lead Implant Date: 20130830
Implantable Lead Implant Date: 20130830
Implantable Lead Location: 753859
Implantable Lead Location: 753860
Implantable Lead Model: 4456
Implantable Lead Model: 4479
Implantable Lead Serial Number: 473325
Implantable Lead Serial Number: 523784
Implantable Pulse Generator Implant Date: 20211011
Lead Channel Impedance Value: 407 Ohm
Lead Channel Impedance Value: 612 Ohm
Lead Channel Pacing Threshold Amplitude: 0.4 V
Lead Channel Pacing Threshold Pulse Width: 0.4 ms
Lead Channel Setting Pacing Amplitude: 2 V
Lead Channel Setting Pacing Amplitude: 2 V
Lead Channel Setting Pacing Pulse Width: 0.4 ms
Lead Channel Setting Sensing Sensitivity: 2.5 mV
Pulse Gen Serial Number: 949303
Zone Setting Status: 755011

## 2022-06-08 ENCOUNTER — Other Ambulatory Visit: Payer: Self-pay | Admitting: Family Medicine

## 2022-06-09 ENCOUNTER — Encounter: Payer: Self-pay | Admitting: Radiation Oncology

## 2022-06-09 ENCOUNTER — Ambulatory Visit
Admission: RE | Admit: 2022-06-09 | Discharge: 2022-06-09 | Disposition: A | Discharge status: Home / Self Care | Payer: Medicare Other | Source: Ambulatory Visit | Attending: Radiation Oncology | Admitting: Radiation Oncology

## 2022-06-09 VITALS — BP 90/61 | HR 65 | Temp 97.2°F | Resp 18 | Wt 190.0 lb

## 2022-06-09 DIAGNOSIS — Z923 Personal history of irradiation: Secondary | ICD-10-CM | POA: Diagnosis not present

## 2022-06-09 DIAGNOSIS — R911 Solitary pulmonary nodule: Secondary | ICD-10-CM

## 2022-06-09 DIAGNOSIS — R918 Other nonspecific abnormal finding of lung field: Secondary | ICD-10-CM | POA: Diagnosis present

## 2022-06-09 NOTE — Telephone Encounter (Signed)
That was last filled on 3/26 Too early

## 2022-06-09 NOTE — Progress Notes (Signed)
Radiation Oncology Follow up Note  Name: Diana Cook   Date:   06/09/2022 MRN:  [id number] DOB: [birth year]    This 72 y.o. female presents to the clinic today for 50-month follow-up status post SBRT for probable stage I non-small cell lung cancer of the right lung and patient treated to her right breast over 5 years prior.  REFERRING PROVIDER: Judy Pimple, MD  HPI: Patient is a 72 year old female now out for months having completed SBRT to her right lung for probable stage I non-small cell lung cancer.  Seen today in routine follow-up she is doing well specifically denies any exacerbation of cough hemoptysis or any change in her pulmonary status she is currently on nasal oxygen..  She had recent CT scan showing radiation changes in the right upper lobe prior right upper lobe nodule no longer visualized.  She has other dominant 8 mm nodules in the left lower lobe although these are unchanged by dating back to 22.  She had mammograms back in November which were BI-RADS 2 benign.  COMPLICATIONS OF TREATMENT: none  FOLLOW UP COMPLIANCE: keeps appointments   PHYSICAL EXAM:  BP 90/61   Pulse 65   Temp (!) 97.2 F (36.2 C)   Resp 18   Wt 190 lb (86.2 kg)   SpO2 97%   PF (!) 3 L/min   BMI 28.89 kg/m  Well-developed well-nourished patient in NAD. HEENT reveals PERLA, EOMI, discs not visualized.  Oral cavity is clear. No oral mucosal lesions are identified. Neck is clear without evidence of cervical or supraclavicular adenopathy. Lungs are clear to A&P. Cardiac examination is essentially unremarkable with regular rate and rhythm without murmur rub or thrill. Abdomen is benign with no organomegaly or masses noted. Motor sensory and DTR levels are equal and symmetric in the upper and lower extremities. Cranial nerves II through XII are grossly intact. Proprioception is intact. No peripheral adenopathy or edema is identified. No motor or sensory levels are noted. Crude visual fields are within  normal range.  RADIOLOGY RESULTS: CT scans mammograms reviewed compatible with above-stated findings  PLAN: Present time she is an excellent response to SBRT treatment with very low side effect profile.  And pleased with her overall progress.  I have asked to see her back in 6 months with a repeat CT scan at that time.  Patient is to call with any concerns.  I would like to take this opportunity to thank you for allowing me to participate in the care of your patient.Carmina Miller, MD

## 2022-06-09 NOTE — Telephone Encounter (Signed)
Name of Medication: Ambien Name of Pharmacy: [facility] [address] Last Fill or Written Date and Quantity: 02/19/22 #30 tabs/ 3 refills  Last Office Visit and Type: ER f/u on 02/21/22 Next Office Visit and Type: none scheduled

## 2022-06-19 ENCOUNTER — Other Ambulatory Visit: Payer: Self-pay | Admitting: Family Medicine

## 2022-06-20 NOTE — Telephone Encounter (Signed)
Name of Medication: Ambien Name of Pharmacy: [facility] [address] Last Fill or Written Date and Quantity: 02/19/22 #30 tabs/ 3 refills  Last Office Visit and Type: ER f/u on 02/21/22 Next Office Visit and Type: none scheduled  

## 2022-06-25 ENCOUNTER — Encounter: Payer: Self-pay | Admitting: Pulmonary Disease

## 2022-06-29 NOTE — Progress Notes (Addendum)
Diana Cook T. Diana Santillanes, MD, CAQ Sports Medicine [facility] [address] [place], [address]  Phone: [phone]  FAX: [phone]  Diana Cook - 72 y.o. female  MRN [id number]  Date of Birth: 1951-01-15  Date: 06/30/2022  PCP: Judy Pimple, MD  Referral: Judy Pimple, MD  Chief Complaint  Patient presents with   Knee Pain    Swelling    Subjective:   Diana Cook is a 72 y.o. very pleasant female patient with Body mass index is 29.04 kg/m. who presents with the following:  Patient presents with ongoing R sided knee pain.  I saw her roughly 15 months ago for ongoing R knee pain.  At that point she was having a prepatellar bursitis.   Really hurting a lot on the R side, but she has been off of it for a few days.  Limited with her ability to walk.  She has medial and lateral joint pain. She has not had any mechanical symptoms including no locking up of the joint, symptomatic giving way, or any additional mechanical symptoms.  No clicking and popping. She does have some crepitus.  She does have some mild effusion.  Knees are hurting all the time now.   Eliquis -she is on this currently  R knee inj, pes + knee  Review of Systems is noted in the HPI, as appropriate  Objective:   BP 120/70 (BP Location: Left Arm, Patient Position: Sitting)   Pulse 76   Temp (!) 97.5 F (36.4 C) (Skin)   Ht 5\' 8"  (1.727 m)   Wt 191 lb (86.6 kg)   SpO2 95% Comment: 3 liters of oxygen  BMI 29.04 kg/m   GEN: No acute distress; alert,appropriate. PULM: Breathing comfortably in no respiratory distress PSYCH: Normally interactive.   Right knee: Full extension.  Flexion to 120. She does have a mild effusion and she has some swelling just caudal to the pes bursa. She has a medial and lateral joint line tenderness. Tender at the pes anserine bursa Stable to varus and valgus stress Lachman and drawer testing are negative She does have some  pain with loading the medial and lateral patellar facets Mild pain with flexion pinch and McMurray's, but no mechanical symptoms Bounce home test is negative  Laboratory and Imaging Data: No results found.   Assessment and Plan:     ICD-10-CM   1. Primary osteoarthritis of right knee  M17.11     2. Chronic pain of right knee  M25.561 DG Knee 4 Views W/Patella Right   G89.29 triamcinolone acetonide (KENALOG-40) injection 40 mg     X-rays: AP Bilateral Weight-bearing, Weightbearing Lateral, Sunrise views, [name] view Indication: knee pain Findings: She does have end-stage degenerative joint disease in the medial compartment with bone-on-bone pathology.  She also has moderate to advanced lateral and patellofemoral osteoarthritis.  Overall moderate to severe osteoarthritis of the knee.  Acute on chronic knee osteoarthritis with exacerbation. She is on Eliquis, limiting her ability to do some oral NSAIDs.   Aspiration/Injection Procedure Note Diana Cook Apr 16, 1950 Date of procedure: 06/30/2022  Procedure: Large Joint Aspiration / Injection of Knee, Right Indications: Pain  Procedure Details Patient verbally consented to procedure. Risks, benefits, and alternatives explained. Sterilely prepped with Chloraprep. Ethyl cholride used for anesthesia. 9 cc Lidocaine 1% mixed with 1 mL of Kenalog 40 mg injected using the anteromedial approach without difficulty. No complications with procedure and tolerated well. Patient had  decreased pain post-injection. Medication: 1 mL of Kenalog 40 mg   Medication Management during today's office visit: Meds ordered this encounter  Medications   triamcinolone acetonide (KENALOG-40) injection 40 mg   There are no discontinued medications.  Orders placed today for conditions managed today: Orders Placed This Encounter  Procedures   DG Knee 4 Views W/Patella Right    Disposition: No follow-ups on file.  Dragon Medical One speech-to-text  software was used for transcription in this dictation.  Possible transcriptional errors can occur using Animal nutritionist.   Signed,  Elpidio Galea. Abbigal Radich, MD   Outpatient Encounter Medications as of 06/30/2022  Medication Sig   acetaminophen (TYLENOL) 500 MG tablet Take 500-1,000 mg by mouth every 6 (six) hours as needed for mild pain or headache.    albuterol (VENTOLIN HFA) 108 (90 Base) MCG/ACT inhaler Inhale 2 puffs into the lungs every 4 (four) hours as needed for wheezing or shortness of breath.   apixaban (ELIQUIS) 5 MG TABS tablet Take 1 tablet by mouth twice daily   atorvastatin (LIPITOR) 40 MG tablet Take 1 tablet by mouth once daily   benzonatate (TESSALON) 100 MG capsule Take by mouth as needed.   clopidogrel (PLAVIX) 75 MG tablet Take 1 tablet (75 mg total) by mouth daily.   FLUoxetine (PROZAC) 20 MG capsule Take 1 capsule by mouth once daily   furosemide (LASIX) 40 MG tablet Take 1 tablet (40 mg total) by mouth daily as needed (swelling, wt gain or needing more oxygen).   gabapentin (NEURONTIN) 100 MG capsule Take 100 mg by mouth as needed.   levETIRAcetam (KEPPRA) 250 MG tablet Take 250 mg by mouth 2 (two) times daily.    meclizine (ANTIVERT) 12.5 MG tablet Take 1 tablet (12.5 mg total) by mouth 3 (three) times daily as needed for dizziness.   omeprazole (PRILOSEC) 40 MG capsule Take 40 mg by mouth daily.   ondansetron (ZOFRAN) 4 MG tablet Take 1 tablet (4 mg total) by mouth every 8 (eight) hours as needed for nausea or vomiting.   OXYGEN Inhale 4 L into the lungs continuous.   Tiotropium Bromide-Olodaterol (STIOLTO RESPIMAT) 2.5-2.5 MCG/ACT AERS INHALE 2 PUFFS BY MOUTH ONCE DAILY   VITAMIN D PO Take 1 tablet by mouth daily.   zolpidem (AMBIEN) 10 MG tablet TAKE 1/2 TO 1 (ONE-HALF TO ONE) TABLET BY MOUTH AT BEDTIME AS NEEDED FOR SLEEP   [EXPIRED] triamcinolone acetonide (KENALOG-40) injection 40 mg    No facility-administered encounter medications on file as of 06/30/2022.

## 2022-06-30 ENCOUNTER — Encounter: Payer: Self-pay | Admitting: Family Medicine

## 2022-06-30 ENCOUNTER — Ambulatory Visit (INDEPENDENT_AMBULATORY_CARE_PROVIDER_SITE_OTHER)
Admission: RE | Admit: 2022-06-30 | Discharge: 2022-06-30 | Disposition: A | Discharge status: Home / Self Care | Payer: Medicare Other | Source: Ambulatory Visit | Attending: Family Medicine | Admitting: Family Medicine

## 2022-06-30 ENCOUNTER — Ambulatory Visit (INDEPENDENT_AMBULATORY_CARE_PROVIDER_SITE_OTHER): Payer: Medicare Other | Admitting: Family Medicine

## 2022-06-30 VITALS — BP 120/70 | HR 76 | Temp 97.5°F | Ht 68.0 in | Wt 191.0 lb

## 2022-06-30 DIAGNOSIS — M25561 Pain in right knee: Secondary | ICD-10-CM

## 2022-06-30 DIAGNOSIS — M1711 Unilateral primary osteoarthritis, right knee: Secondary | ICD-10-CM

## 2022-06-30 DIAGNOSIS — G8929 Other chronic pain: Secondary | ICD-10-CM | POA: Diagnosis not present

## 2022-06-30 MED ORDER — TRIAMCINOLONE ACETONIDE 40 MG/ML IJ SUSP
40.0000 mg | Freq: Once | INTRAMUSCULAR | Status: AC
  Administered 2022-06-30: 40 mg via INTRA_ARTICULAR

## 2022-07-01 ENCOUNTER — Encounter: Payer: Self-pay | Admitting: Family Medicine

## 2022-07-08 ENCOUNTER — Other Ambulatory Visit (INDEPENDENT_AMBULATORY_CARE_PROVIDER_SITE_OTHER): Payer: Self-pay | Admitting: Vascular Surgery

## 2022-07-10 NOTE — Progress Notes (Signed)
Remote pacemaker transmission.   

## 2022-07-15 ENCOUNTER — Ambulatory Visit: Payer: Medicare Other | Admitting: Pulmonary Disease

## 2022-07-15 ENCOUNTER — Encounter: Payer: Self-pay | Admitting: Pulmonary Disease

## 2022-07-15 VITALS — BP 120/80 | HR 77 | Temp 97.9°F | Ht 68.0 in | Wt 194.2 lb

## 2022-07-15 DIAGNOSIS — J432 Centrilobular emphysema: Secondary | ICD-10-CM

## 2022-07-15 DIAGNOSIS — J9611 Chronic respiratory failure with hypoxia: Secondary | ICD-10-CM | POA: Diagnosis not present

## 2022-07-15 DIAGNOSIS — I442 Atrioventricular block, complete: Secondary | ICD-10-CM | POA: Diagnosis not present

## 2022-07-15 DIAGNOSIS — C3491 Malignant neoplasm of unspecified part of right bronchus or lung: Secondary | ICD-10-CM | POA: Diagnosis not present

## 2022-07-15 NOTE — Progress Notes (Unsigned)
Subjective:    Patient ID: Diana Cook, female    DOB: 1950-04-26, 72 y.o.   MRN: [id number] Patient Care Team: Tower, Audrie Gallus, MD as PCP - General (Family Medicine) End, Cristal Deer, MD as PCP - Cardiology (Cardiology) Duke Salvia, MD as Consulting Physician (Cardiology) Irene Limbo., MD as Referring Physician (Ophthalmology) Tower, Audrie Gallus, MD as Consulting Physician (Family Medicine) Lemar Livings, Merrily Pew, MD (General Surgery)  Chief Complaint  Patient presents with   Follow-up    SOB with exertion. No wheezing. Occasional cough with white sputum.    HPI The patient is a 72 year old former smoker (quit 2012, 42 PY) who follows here for the issue of dyspnea on exertion in the setting of emphysema with preserved FEV1.  She also has chronic respiratory failure with hypoxia related to emphysema. This is a scheduled follow-up visit.  Last seen on 14 April 2022, she had completed SBRT for a lung nodule was PET avid.  She underwent follow-up CT chest on 02 June 2022 showing radiation changes on the right upper lobe with the nodule no longer discretely visualized.  With regards to her chronic respiratory failure she is compliant with oxygen with activity and sleep.  She continues to do well with this.  She would like to know though if she can be off of the oxygen at any given time.  Previously she had had difficulties with debilitating vertigo manage she was noted to have 80% stenosis of the right carotid.  She had endarterectomy 29 April 2022 and since then her issues with vertigo have improved..   She has a prior history of complete heart block and is status post pacemaker placement had pacer revision in October 2021 by Dr. Graciela Husbands.  On a prior device check she was noted to have some atrial arrhythmias and PAF and has been now started on Eliquis.  She is compliant with Stiolto and as needed albuterol.  Has not had any fevers, chills or sweats.  No calf tenderness or lower extremity edema.   No sleep disturbance.  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  She is not on inhaled corticosteroids.  She is being managed with PPI for gastroesophageal reflux.   Review of Systems A 10 point review of systems was performed and it is as noted above otherwise negative.  Patient Active Problem List   Diagnosis Date Noted   Carotid stenosis, symptomatic w/o infarct, right 04/29/2022   Carotid stenosis, symptomatic w/o infarct 03/23/2022   Vertigo 02/21/2022   Fall at home 11/25/2021   Right shoulder pain 11/25/2021   Mild anemia 11/03/2021   Fall 09/23/2021   Thyroid nodule 09/17/2021   Hyponatremia 09/17/2021   Sepsis due to gram-negative UTI (HCC) 09/05/2021   Dyslipidemia 09/05/2021   Depression 09/05/2021   GERD without esophagitis 09/05/2021   Vitamin D deficiency 08/20/2021   Colon cancer screening 07/30/2021   Hypercalcemia 07/29/2021   Diarrhea 07/29/2021   UTI (urinary tract infection) 07/17/2021   AKI (acute kidney injury) (HCC) 07/17/2021   Dysuria 07/08/2021   Arterial hypotension 07/08/2021   Paroxysmal atrial fibrillation (HCC) 04/11/2021   Current use of proton pump inhibitor 10/24/2020   Sleep apnea 10/24/2020   Fatigue 10/24/2020   Lightheadedness 05/16/2020   Radicular pain in left arm 03/02/2020   Neck pain  on left side 02/15/2020   Paresthesia 02/15/2020   Chronic respiratory failure with hypoxia (HCC) 11/17/2019   Chronic heart failure with preserved ejection fraction (HFpEF) (HCC) 08/11/2019   Coronary artery disease involving native coronary artery of native heart without angina pectoris 06/20/2019   Gastroesophageal reflux disease    Stomach irritation    Abnormal CT scan, esophagus    Polyp of ascending colon    Personal history of tobacco use, presenting hazards to health  01/18/2018   Grade I diastolic dysfunction 12/21/2017   Globus pharyngeus 12/21/2017   Chronic obstructive pulmonary disease (HCC) 12/15/2017   Hyperlipidemia LDL goal <70 12/07/2017   Seizures (HCC) 10/01/2016   Elevated glucose 08/31/2016   Grief reaction 04/11/2016   History of breast cancer 12/11/2015   Osteoporosis 11/22/2015   Estrogen deficiency 09/07/2015   Bruising 09/07/2015   Wrinkles 09/07/2015   Viral URI with cough 05/22/2015   Post herpetic neuralgia 09/01/2014   Encounter for Medicare annual wellness exam 07/11/2014   Routine general medical examination at a health care facility 06/26/2013   H/O small bowel obstruction 04/15/2013   Dyspnea on exertion 02/10/2013   Complete heart block (HCC) 11/03/2011   Pacemaker-[employer] 11/03/2011   Lumbar spinal stenosis 09/02/2011   POSTMENOPAUSAL STATUS 08/06/2009   HIDRADENITIS SUPPURATIVA 06/26/2008   HERNIATED DISC 12/14/2007   BACK, LOWER, PAIN 12/02/2007   Hyperkalemia 08/06/2007   HX, PERSONAL, COLONIC POLYPS 09/14/2006   Former smoker 06/10/2006   ASTHMA 06/10/2006   H/O idiopathic seizure 06/10/2006   Social History   Tobacco Use   Smoking status: Former    Packs/day: 1.50    Years: 42.00    Additional pack years: 0.00    Total pack years: 63.00    Types: Cigarettes    Quit date: 11/11/2010    Years since quitting: 11.6   Smokeless tobacco: Never  Substance Use Topics   Alcohol use: Yes    Alcohol/week: 1.0 - 2.0 standard drink of alcohol    Types: 1 - 2 Glasses of wine per week    Comment: weekly   Allergies  Allergen Reactions   Codeine Nausea And Vomiting   Morphine And Codeine Nausea Only   Immunization History  Administered Date(s) Administered   Fluad Quad(high Dose 65+) 10/24/2020   Influenza Whole 02/24/2005   Influenza, High Dose Seasonal PF 11/04/2018, 11/22/2019   Influenza,inj,Quad PF,6+ Mos 12/03/2012, 12/09/2013, 01/16/2015, 02/06/2016, 11/04/2016, 12/08/2017    Influenza-Unspecified 10/25/2021   PFIZER Comirnaty(Gray Top)Covid-19 Tri-Sucrose Vaccine 07/06/2020   PFIZER(Purple Top)SARS-COV-2 Vaccination 02/28/2019, 03/21/2019, 11/14/2019   Pneumococcal Conjugate-13 07/11/2014   Pneumococcal Polysaccharide-23 03/21/2005, 07/26/2015   Td 01/09/1999, 06/14/2007   Zoster, Live 09/07/2015       Objective:   Physical Exam BP 120/80 (BP Location: Left Arm, Cuff Size: Normal)   Pulse 77   Temp 97.9 F (36.6 C)   Ht 5\' 8"  (1.727 m)   Wt 194 lb 3.2 oz (88.1 kg)   SpO2 97%   BMI 29.53 kg/m   SpO2: 97 % O2 Device: Nasal cannula O2 Flow Rate (L/min): 4 L/min  GENERAL: Well-developed well-nourished woman in no acute respiratory distress.  She is fully ambulatory.  No conversational dyspnea.  No hoarseness noted today. HEAD: Normocephalic, atraumatic. EYES: Pupils equal, round, reactive to light.  No scleral icterus. MOUTH: Tissue intact.  No thrush. NECK: Supple. No thyromegaly. No nodules. No JVD.  Trachea midline. PULMONARY: Symmetrical air entry, clear with no adventitious sounds CARDIOVASCULAR: S1 and S2. Regular rate  and rhythm. No rubs murmurs gallops heard. GASTROINTESTINAL: Nondistended. MUSCULOSKELETAL: No joint deformity, no clubbing, no edema. NEUROLOGIC: Awake, alert, oriented x4, no focal deficits. SKIN: Intact,warm,dry, on limited exam no rashes. PSYCH: Normal mood and behavior.    Ambulatory oximetry was performed today: On room air patient was 94% saturated, she ambulated 3 laps around the office (approximately 750 feet) at slow pace with minimal shortness of breath noted.  O2 nadir 89%.  Heart rate at baseline was 69 bpm at the end of the exercise 99 bpm.  On room air during the visit the patient was able to maintain 92 to 94% saturations.     Assessment & Plan:     ICD-10-CM   1. Centrilobular emphysema (HCC)  J43.2    Continue Stiolto 2 puffs daily Continue as needed albuterol    2. Chronic respiratory failure with  hypoxia (HCC)  J96.11    Continue oxygen at 2 L/min with activity Continue oxygen 4 L/min with sleep May be off during quiet activity/rest    3. 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 Complete heart block (HCC)  I44.2    Pacer dependent Follows with cardiology     Will see the patient in follow-up in 4 months time she is to call sooner should any new problems arise.  Gailen Shelter, MD Advanced Bronchoscopy PCCM [facility]    *This note was dictated using voice recognition software/Dragon.  Despite best efforts to proofread, errors can occur which can change the meaning. Any transcriptional errors that result from this process are unintentional and may not be fully corrected at the time of dictation.

## 2022-07-15 NOTE — Patient Instructions (Addendum)
When you are sitting quietly without too much activity you may be off of the oxygen.  During activity you may decrease your oxygen flow to 2 L/min.  Continue to use 4 L at nighttime of the oxygen.  Do investigate the possibility of an upright walker (Upwalker, Elenker, etc).  Will see him in follow-up in 4 months time call sooner should any new problems arise.

## 2022-07-16 ENCOUNTER — Encounter: Payer: Self-pay | Admitting: Pulmonary Disease

## 2022-08-19 ENCOUNTER — Other Ambulatory Visit: Payer: Self-pay | Admitting: Internal Medicine

## 2022-09-02 ENCOUNTER — Other Ambulatory Visit (INDEPENDENT_AMBULATORY_CARE_PROVIDER_SITE_OTHER): Payer: Self-pay | Admitting: Nurse Practitioner

## 2022-09-04 ENCOUNTER — Ambulatory Visit (INDEPENDENT_AMBULATORY_CARE_PROVIDER_SITE_OTHER): Payer: Medicare Other

## 2022-09-04 DIAGNOSIS — I442 Atrioventricular block, complete: Secondary | ICD-10-CM | POA: Diagnosis not present

## 2022-09-04 LAB — CUP PACEART REMOTE DEVICE CHECK
Battery Remaining Longevity: 138 mo
Battery Remaining Percentage: 100 %
Brady Statistic RA Percent Paced: 3 %
Brady Statistic RV Percent Paced: 100 %
Date Time Interrogation Session: 20240711011000
Implantable Lead Connection Status: 753985
Implantable Lead Connection Status: 753985
Implantable Lead Implant Date: 20130830
Implantable Lead Implant Date: 20130830
Implantable Lead Location: 753859
Implantable Lead Location: 753860
Implantable Lead Model: 4456
Implantable Lead Model: 4479
Implantable Lead Serial Number: 473325
Implantable Lead Serial Number: 523784
Implantable Pulse Generator Implant Date: 20211011
Lead Channel Impedance Value: 439 Ohm
Lead Channel Impedance Value: 622 Ohm
Lead Channel Pacing Threshold Amplitude: 0.4 V
Lead Channel Pacing Threshold Pulse Width: 0.4 ms
Lead Channel Setting Pacing Amplitude: 2 V
Lead Channel Setting Pacing Amplitude: 2 V
Lead Channel Setting Pacing Pulse Width: 0.4 ms
Lead Channel Setting Sensing Sensitivity: 2.5 mV
Pulse Gen Serial Number: 949303
Zone Setting Status: 755011

## 2022-09-18 ENCOUNTER — Encounter: Payer: Self-pay | Admitting: Family Medicine

## 2022-09-18 ENCOUNTER — Ambulatory Visit (INDEPENDENT_AMBULATORY_CARE_PROVIDER_SITE_OTHER): Payer: Medicare Other | Admitting: Family Medicine

## 2022-09-18 VITALS — BP 126/68 | HR 61 | Temp 97.6°F | Ht 67.0 in | Wt 194.2 lb

## 2022-09-18 DIAGNOSIS — R7309 Other abnormal glucose: Secondary | ICD-10-CM | POA: Diagnosis not present

## 2022-09-18 DIAGNOSIS — R569 Unspecified convulsions: Secondary | ICD-10-CM

## 2022-09-18 DIAGNOSIS — D649 Anemia, unspecified: Secondary | ICD-10-CM

## 2022-09-18 DIAGNOSIS — E7800 Pure hypercholesterolemia, unspecified: Secondary | ICD-10-CM | POA: Diagnosis not present

## 2022-09-18 DIAGNOSIS — M48062 Spinal stenosis, lumbar region with neurogenic claudication: Secondary | ICD-10-CM

## 2022-09-18 DIAGNOSIS — Z79899 Other long term (current) drug therapy: Secondary | ICD-10-CM | POA: Diagnosis not present

## 2022-09-18 DIAGNOSIS — I5032 Chronic diastolic (congestive) heart failure: Secondary | ICD-10-CM | POA: Diagnosis not present

## 2022-09-18 DIAGNOSIS — F32A Depression, unspecified: Secondary | ICD-10-CM

## 2022-09-18 DIAGNOSIS — M810 Age-related osteoporosis without current pathological fracture: Secondary | ICD-10-CM

## 2022-09-18 DIAGNOSIS — E2839 Other primary ovarian failure: Secondary | ICD-10-CM

## 2022-09-18 LAB — COMPREHENSIVE METABOLIC PANEL
ALT: 20 U/L (ref 0–35)
AST: 23 U/L (ref 0–37)
Albumin: 4.2 g/dL (ref 3.5–5.2)
Alkaline Phosphatase: 104 U/L (ref 39–117)
BUN: 22 mg/dL (ref 6–23)
CO2: 22 mEq/L (ref 19–32)
Calcium: 9.6 mg/dL (ref 8.4–10.5)
Chloride: 102 mEq/L (ref 96–112)
Creatinine, Ser: 0.97 mg/dL (ref 0.40–1.20)
GFR: 57.71 mL/min — ABNORMAL LOW (ref >60.00)
Glucose, Bld: 89 mg/dL (ref 70–99)
Potassium: 4.3 mEq/L (ref 3.5–5.1)
Sodium: 135 mEq/L (ref 135–145)
Total Bilirubin: 0.8 mg/dL (ref 0.2–1.2)
Total Protein: 7 g/dL (ref 6.0–8.3)

## 2022-09-18 LAB — CBC WITH DIFFERENTIAL/PLATELET
Basophils Absolute: 0 10*3/uL (ref 0.0–0.1)
Basophils Relative: 0.7 % (ref 0.0–3.0)
Eosinophils Absolute: 0.2 10*3/uL (ref 0.0–0.7)
Eosinophils Relative: 2.7 % (ref 0.0–5.0)
HCT: 38 % (ref 36.0–46.0)
Hemoglobin: 12.3 g/dL (ref 12.0–15.0)
Lymphocytes Relative: 11.7 % — ABNORMAL LOW (ref 12.0–46.0)
Lymphs Abs: 0.8 10*3/uL (ref 0.7–4.0)
MCHC: 32.3 g/dL (ref 30.0–36.0)
MCV: 90.2 fl (ref 78.0–100.0)
Monocytes Absolute: 0.6 10*3/uL (ref 0.1–1.0)
Monocytes Relative: 8.7 % (ref 3.0–12.0)
Neutro Abs: 5 10*3/uL (ref 1.4–7.7)
Neutrophils Relative %: 76.2 % (ref 43.0–77.0)
Platelets: 323 10*3/uL (ref 150.0–400.0)
RBC: 4.21 Mil/uL (ref 3.87–5.11)
RDW: 14.7 % (ref 11.5–15.5)
WBC: 6.6 10*3/uL (ref 4.0–10.5)

## 2022-09-18 LAB — HEMOGLOBIN A1C: Hgb A1c MFr Bld: 5.9 % (ref 4.6–6.5)

## 2022-09-18 LAB — LIPID PANEL
Cholesterol: 129 mg/dL (ref 0–200)
HDL: 51.4 mg/dL (ref >39.00)
LDL Cholesterol: 58 mg/dL (ref 0–99)
NonHDL: 77.24
Total CHOL/HDL Ratio: 3
Triglycerides: 95 mg/dL (ref 0.0–149.0)
VLDL: 19 mg/dL (ref 0.0–40.0)

## 2022-09-18 LAB — TSH: TSH: 1.16 u[IU]/mL (ref 0.35–5.50)

## 2022-09-18 LAB — VITAMIN D 25 HYDROXY (VIT D DEFICIENCY, FRACTURES): VITD: 34.77 ng/mL (ref 30.00–100.00)

## 2022-09-18 LAB — VITAMIN B12: Vitamin B-12: 484 pg/mL (ref 211–911)

## 2022-09-18 LAB — IRON: Iron: 79 ug/dL (ref 42–145)

## 2022-09-18 NOTE — Patient Instructions (Addendum)
Any exercise you can do to build strength is good  Get back to whatever PT exercises you can do   Check out some chair yoga programs on line   Take care of yourself    Labs today   Call to schedule your bone density test  You have an order for:  []   2D Mammogram  []   3D Mammogram  [x]   Bone Density     Please call for appointment:   [x]   [facility]  [address] [place] [address]  [phone]  []   [facility])   [address]  [place], [place] [address]  [phone]  []   [facility]      [address] [place], [place]        [phone]         []   [facility]  [address] [place], [place]  [phone]  []  [facility]   [address]   [place], [place] [address]  [phone]  []  [facility]  [address] [place], [place] [address] [phone]    Make sure to wear two piece clothing  No lotions powders or deodorants the day of the appointment Make sure to bring picture ID and insurance card.  Bring list of medications you are currently taking including any supplements.   Schedule your screening mammogram through MyChart!   Select [facility] imaging sites can now be scheduled through MyChart.  Log into your MyChart account.  Go to 'Visit' (or 'Appointments' if  on mobile App) --> Schedule an  Appointment  Under 'Select a Reason for Visit' choose the Mammogram  Screening option.  Complete the pre-visit questions  and select the time and place that  best fits your schedule

## 2022-09-18 NOTE — Assessment & Plan Note (Signed)
Disc goals for lipids and reasons to control them Rev last labs with pt Rev low sat fat diet in detail Labs today  Continues atorvastatin 40 mg daily

## 2022-09-18 NOTE — Assessment & Plan Note (Signed)
B12 and D levels ordered

## 2022-09-18 NOTE — Assessment & Plan Note (Signed)
Still taking keppra  Under neuro care No seizures since last visit

## 2022-09-18 NOTE — Assessment & Plan Note (Signed)
Continues cardiology care  Blood pressure and lipids controlled  On 02  Has carotid stenosis-on 2 anticoagulants

## 2022-09-18 NOTE — Assessment & Plan Note (Signed)
Has had some helpful injections

## 2022-09-18 NOTE — Progress Notes (Signed)
Subjective:    Patient ID: Diana Cook, female    DOB: 06/29/1950, 72 y.o.   MRN: [id number]  HPI  Wt Readings from Last 3 Encounters:  09/18/22 194 lb 4 oz (88.1 kg)  07/15/22 194 lb 3.2 oz (88.1 kg)  06/30/22 191 lb (86.6 kg)   30.42 kg/m  Vitals:   09/18/22 0851  BP: 126/68  Pulse: 61  Temp: 97.6 F (36.4 C)  SpO2: 95%    Pt presents for follow up of chronic health problems / annual   Doing ok overall  Did have a trip the the mountains   Fights fatigue with copd and heart issues    Continues follow up with cardiology and vascular for CAD and carotid stenosis and a fit and CHF Had carotid endarterectomy   Eliquis Plavix Thinks this is short term  Due for cardiology visit    Lipitor  Lasix   Hyperlipidemia Lab Results  Component Value Date   CHOL 123 07/23/2021   HDL 49.40 07/23/2021   LDLCALC 50 07/23/2021   TRIG 116.0 07/23/2021   CHOLHDL 2 07/23/2021   Atorvastatin 40 mg daily   Keppra for seizure disorder-sees neuro   History of osteoporosis  On gabapentin prn  Dexa 2019  Falls- none recent (since last one when she tore rotator cuff/ did not do surgery)  Fractures : none  Supplements D3  Exercise : cannot do much with heart and copd issues She has a chair pedaler that she uses but knees bother her  Right rotator cuff tear-cannot do upper body work   Did some PT in the past -has not gotten back to it since the vertigo    Personal history of breast cancer  Mammogram 11 /2023 Self breast exam - no new lumps  (has scar tissue/ no changes- has had pain at scar site chronically)     Lab Results  Component Value Date   NA 137 04/30/2022   K 4.0 04/30/2022   CO2 22 04/30/2022   GLUCOSE 107 (H) 04/30/2022   BUN 23 04/30/2022   CREATININE 0.87 04/30/2022   CALCIUM 8.7 (L) 04/30/2022   GFR 54.72 (L) 09/17/2021   GFRNONAA >60 04/30/2022    GERD Takes omeprazole 40 mg daily  Lab Results  Component Value Date   VITAMINB12 705  07/23/2021    Mood History of depression  Prozac 20 mg daily      09/18/2022    9:32 AM 03/12/2022    1:08 PM 09/17/2021    2:30 PM 07/29/2021    8:09 AM 05/16/2021    4:43 PM  Depression screen PHQ 2/9  Decreased Interest 1 0 0 0 1  Down, Depressed, Hopeless 0 0 0 0 1  PHQ - 2 Score 1 0 0 0 2  Altered sleeping 0    0  Tired, decreased energy 1    3  Change in appetite 1    3  Feeling bad or failure about yourself  0    1  Trouble concentrating 0    0  Moving slowly or fidgety/restless 3    2  Suicidal thoughts 0    0  PHQ-9 Score 6    11  Difficult doing work/chores Somewhat difficult    Somewhat difficult   History of elevated glucose Lab Results  Component Value Date   HGBA1C 5.6 07/23/2021   Mild anemia in past Lab Results  Component Value Date   WBC 5.2 04/30/2022   HGB  10.5 (L) 04/30/2022   HCT 32.4 (L) 04/30/2022   MCV 89.5 04/30/2022   PLT 208 04/30/2022   Spinal stenosis  Getting some shots     Patient Active Problem List   Diagnosis Date Noted   Carotid stenosis, symptomatic w/o infarct, right 04/29/2022   Carotid stenosis, symptomatic w/o infarct 03/23/2022   Right shoulder pain 11/25/2021   Mild anemia 11/03/2021   Thyroid nodule 09/17/2021   Sepsis due to gram-negative UTI (HCC) 09/05/2021   Dyslipidemia 09/05/2021   Depression 09/05/2021   GERD without esophagitis 09/05/2021   Vitamin D deficiency 08/20/2021   Colon cancer screening 07/30/2021   Hypercalcemia 07/29/2021   Diarrhea 07/29/2021   Dysuria 07/08/2021   Arterial hypotension 07/08/2021   Paroxysmal atrial fibrillation (HCC) 04/11/2021   Current use of proton pump inhibitor 10/24/2020   Sleep apnea 10/24/2020   Fatigue 10/24/2020   Lightheadedness 05/16/2020   Radicular pain in left arm 03/02/2020   Neck pain on left side 02/15/2020   Paresthesia 02/15/2020   Chronic respiratory failure with hypoxia (HCC) 11/17/2019   Chronic heart failure with preserved ejection fraction  (HFpEF) (HCC) 08/11/2019   Coronary artery disease involving native coronary artery of native heart without angina pectoris 06/20/2019   Gastroesophageal reflux disease    Stomach irritation    Abnormal CT scan, esophagus    Polyp of ascending colon    Personal history of tobacco use, presenting hazards to health 01/18/2018   Grade I diastolic dysfunction 12/21/2017   Globus pharyngeus 12/21/2017   Chronic obstructive pulmonary disease (HCC) 12/15/2017   Hyperlipidemia 12/07/2017   Seizures (HCC) 10/01/2016   Elevated glucose 08/31/2016   Grief reaction 04/11/2016   History of breast cancer 12/11/2015   Osteoporosis 11/22/2015   Estrogen deficiency 09/07/2015   Bruising 09/07/2015   Wrinkles 09/07/2015   Viral URI with cough 05/22/2015   Post herpetic neuralgia 09/01/2014   Encounter for Medicare annual wellness exam 07/11/2014   Routine general medical examination at a health care facility 06/26/2013   H/O small bowel obstruction 04/15/2013   Dyspnea on exertion 02/10/2013   Complete heart block (HCC) 11/03/2011   Pacemaker-[employer] 11/03/2011   Lumbar spinal stenosis 09/02/2011   POSTMENOPAUSAL STATUS 08/06/2009   HIDRADENITIS SUPPURATIVA 06/26/2008   HERNIATED DISC 12/14/2007   BACK, LOWER, PAIN 12/02/2007   HX, PERSONAL, COLONIC POLYPS 09/14/2006   Former smoker 06/10/2006   ASTHMA 06/10/2006   H/O idiopathic seizure 06/10/2006   Past Medical History:  Diagnosis Date   Allergic rhinitis    Arthritis    Asthma    as a child, mild now   Breast cancer (HCC) 12/2015   right breast cancer, lumpectomy and mammosite    Colon polyps    colonoscopy 7/08, tubular adenoma   Complete heart block (HCC) 09/2011   s/p PPM implanted in [facility]   COPD (chronic obstructive pulmonary disease) (HCC)    Myocardial infarction (HCC) 2011   Osteopenia 10/2015   Pacemaker    2011   Paroxysmal atrial fibrillation (HCC) 03/2021   Incidentally detected on pacemaker  interrogation   Personal history of radiation therapy 2017   right breast ca, mammosite placed   Rotator cuff tear, right 02/2022   Had an MRI in [place] "I have 2 tears."   Seizure disorder (HCC)    Seizures (HCC)    first one was when she was 72 years old    Small bowel obstruction (HCC)    1988 and 2002  Tobacco abuse    Past Surgical History:  Procedure Laterality Date   ABDOMINAL HYSTERECTOMY     BREAST BIOPSY Right 2007   benign inflammatory changes, mass due to underwire bra   BREAST BIOPSY Left 01/02/2016   columnar cell changes without atypical hyperplasia.   BREAST BIOPSY Right 12/06/2015   rt breast mass 10:00, bx done at Dr. Arvella Nigh office, invasive ductal carcinoma   BREAST EXCISIONAL BIOPSY Left 01/02/2016   COLUMNAR CELL CHANGE AND HYPERPLASIA ASSOCIATED WITH LUMINAL AND STROMAL CALCIFICATIONS   BREAST LUMPECTOMY Right 01/02/2016   invasive mammary carcinoma, clear margins, negative LN   BREAST LUMPECTOMY WITH SENTINEL LYMPH NODE BIOPSY Right 01/02/2016   pT1c, N0; ER/ PR 100%; Her 2 neu not over expressed: BREAST LUMPECTOMY WITH SENTINEL LYMPH NODE BX;  Surgeon: Earline Mayotte, MD;  Location: [facility] ORS;  Service: General;  Laterality: Right;   CARDIAC CATHETERIZATION     CAROTID PTA/STENT INTERVENTION Right 04/29/2022   Procedure: CAROTID PTA/STENT INTERVENTION;  Surgeon: Renford Dills, MD;  Location: [facility] INVASIVE CV [facility];  Service: Cardiovascular;  Laterality: Right;   CATARACT EXTRACTION Bilateral    COLONOSCOPY  10/2015   Dr Christella Hartigan   COLONOSCOPY WITH PROPOFOL N/A 08/19/2018   Procedure: COLONOSCOPY WITH PROPOFOL;  Surgeon: Pasty Spillers, MD;  Location: [facility];  Service: Endoscopy;  Laterality: N/A;   ESOPHAGOGASTRODUODENOSCOPY (EGD) WITH PROPOFOL N/A 08/19/2018   Procedure: ESOPHAGOGASTRODUODENOSCOPY (EGD) WITH PROPOFOL;  Surgeon: Pasty Spillers, MD;  Location: [facility] END[facility];  Service: Endoscopy;  Laterality: N/A;   EXPLORATORY LAPAROTOMY   01/25/2001   Exploratory laparotomy, lysis of adhesions, identification of internal hernia secondary to omental adhesion. Prolonged postoperative ileus.   gyn surgery  1993   hysterectomy- form endometriosis   LAPAROSCOPY     PACEMAKER INSERTION  10/24/11   [employer] Advantio dual chamber PPM implanted by Dr Venda Rodes at [facility] in [place]   PPM GENERATOR CHANGEOUT N/A 12/05/2019   Procedure: PPM GENERATOR Janeann Merl;  Surgeon: Duke Salvia, MD;  Location: [facility] INVASIVE CV LAB;  Service: Cardiovascular;  Laterality: N/A;   RIGHT/LEFT HEART CATH AND CORONARY ANGIOGRAPHY Bilateral 07/19/2019   Procedure: RIGHT/LEFT HEART CATH AND CORONARY ANGIOGRAPHY;  Surgeon: Yvonne Kendall, MD;  Location: [facility] INVASIVE CV LAB;  Service: Cardiovascular;  Laterality: Bilateral;   TEMPORARY PACEMAKER N/A 12/05/2019   Procedure: TEMPORARY PACEMAKER;  Surgeon: Duke Salvia, MD;  Location[facility] INVASIVE CV LAB;  Service: Cardiovascular;  Laterality: N/A;   Social History   Tobacco Use   Smoking status: Former    Current packs/day: 0.00    Average packs/day: 1.5 packs/day for 42.0 years (63.0 ttl pk-yrs)    Types: Cigarettes    Start date: 11/10/1968    Quit date: 11/11/2010    Years since quitting: 11.8   Smokeless tobacco: Never  Vaping Use   Vaping status: Never Used  Substance Use Topics   Alcohol use: Yes    Alcohol/week: 1.0 - 2.0 standard drink of alcohol    Types: 1 - 2 Glasses of wine per week    Comment: weekly   Drug use: No   Family History  Problem Relation Age of Onset   Stroke Mother    Heart disease Mother 32       MI and CABG   Dementia Mother    Coronary artery disease Father    Parkinsonism Father    Breast cancer Sister    Cancer - Cervical Daughter 88  died 1/18   Heart attack Brother 26   Colon cancer Neg Hx    Allergies  Allergen Reactions   Codeine Nausea And Vomiting   Morphine And Codeine Nausea Only   Current Outpatient  Medications on File Prior to Visit  Medication Sig Dispense Refill   acetaminophen (TYLENOL) 500 MG tablet Take 500-1,000 mg by mouth every 6 (six) hours as needed for mild pain or headache.      albuterol (VENTOLIN HFA) 108 (90 Base) MCG/ACT inhaler Inhale 2 puffs into the lungs every 4 (four) hours as needed for wheezing or shortness of breath. 18 g 1   apixaban (ELIQUIS) 5 MG TABS tablet Take 1 tablet by mouth twice daily 180 tablet 1   atorvastatin (LIPITOR) 40 MG tablet Take 1 tablet by mouth once daily 90 tablet 1   clopidogrel (PLAVIX) 75 MG tablet Take 1 tablet by mouth once daily 90 tablet 0   FLUoxetine (PROZAC) 20 MG capsule Take 1 capsule by mouth once daily 90 capsule 3   furosemide (LASIX) 40 MG tablet Take 1 tablet (40 mg total) by mouth daily as needed (swelling, wt gain or needing more oxygen). 90 tablet 0   gabapentin (NEURONTIN) 100 MG capsule Take 100 mg by mouth as needed.     levETIRAcetam (KEPPRA) 250 MG tablet Take 250 mg by mouth 2 (two) times daily.      omeprazole (PRILOSEC) 40 MG capsule Take 40 mg by mouth daily.     OXYGEN Inhale 4 L into the lungs continuous.     Tiotropium Bromide-Olodaterol (STIOLTO RESPIMAT) 2.5-2.5 MCG/ACT AERS INHALE 2 PUFFS BY MOUTH ONCE DAILY 4 g 5   VITAMIN D PO Take 1 tablet by mouth daily.     zolpidem (AMBIEN) 10 MG tablet TAKE 1/2 TO 1 (ONE-HALF TO ONE) TABLET BY MOUTH AT BEDTIME AS NEEDED FOR SLEEP 30 tablet 3   meclizine (ANTIVERT) 12.5 MG tablet Take 1 tablet (12.5 mg total) by mouth 3 (three) times daily as needed for dizziness. 30 tablet 0   No current facility-administered medications on file prior to visit.    Review of Systems  Constitutional:  Positive for fatigue. Negative for activity change, appetite change, fever and unexpected weight change.  HENT:  Negative for congestion, ear pain, rhinorrhea, sinus pressure and sore throat.   Eyes:  Negative for pain, redness and visual disturbance.  Respiratory:  Negative for  cough, shortness of breath and wheezing.   Cardiovascular:  Negative for chest pain and palpitations.  Gastrointestinal:  Negative for abdominal pain, blood in stool, constipation and diarrhea.  Endocrine: Negative for polydipsia and polyuria.  Genitourinary:  Negative for dysuria, frequency and urgency.  Musculoskeletal:  Positive for arthralgias and back pain. Negative for myalgias.  Skin:  Negative for pallor and rash.  Allergic/Immunologic: Negative for environmental allergies.  Neurological:  Negative for dizziness, syncope and headaches.  Hematological:  Negative for adenopathy. Does not bruise/bleed easily.  Psychiatric/Behavioral:  Negative for decreased concentration and dysphoric mood. The patient is not nervous/anxious.        Objective:   Physical Exam Constitutional:      General: She is not in acute distress.    Appearance: Normal appearance. She is well-developed. She is obese. She is not ill-appearing or diaphoretic.  HENT:     Head: Normocephalic and atraumatic.  Eyes:     Conjunctiva/sclera: Conjunctivae normal.     Pupils: Pupils are equal, round, and reactive to light.  Neck:  Thyroid: No thyromegaly.     Vascular: No carotid bruit or JVD.  Cardiovascular:     Rate and Rhythm: Normal rate and regular rhythm.     Heart sounds: Normal heart sounds.     No gallop.  Pulmonary:     Effort: Pulmonary effort is normal. No respiratory distress.     Breath sounds: Normal breath sounds. No stridor. No wheezing, rhonchi or rales.     Comments: Wearing 02 via [place] Diffusely distant bs  Abdominal:     General: There is no distension or abdominal bruit.     Palpations: Abdomen is soft.  Musculoskeletal:     Cervical back: Normal range of motion and neck supple.     Right lower leg: No edema.     Left lower leg: No edema.  Lymphadenopathy:     Cervical: No cervical adenopathy.  Skin:    General: Skin is warm and dry.     Coloration: Skin is not pale.      Findings: No rash.  Neurological:     Mental Status: She is alert.     Cranial Nerves: No cranial nerve deficit.     Coordination: Coordination normal.     Deep Tendon Reflexes: Reflexes are normal and symmetric. Reflexes normal.  Psychiatric:        Mood and Affect: Mood normal.           Assessment & Plan:   Problem List Items Addressed This Visit       Cardiovascular and Mediastinum   Chronic heart failure with preserved ejection fraction (HFpEF) (HCC)    Continues cardiology care  Blood pressure and lipids controlled  On 02  Has carotid stenosis-on 2 anticoagulants        Musculoskeletal and Integument   Osteoporosis    Dexa ordered On gabapentin  Reviewed dexa 2019  No recent falls or fracture Discussed D3 intake  Discussed exercise       Relevant Orders   VITAMIN D 25 Hydroxy (Vit-D Deficiency, Fractures)     Other   Seizures (HCC)    Still taking keppra  Under neuro care No seizures since last visit      Mild anemia    Re check cbc with iron  Normocytic, likely chronic dz      Relevant Orders   Comprehensive metabolic panel   CBC with Differential/Platelet   Iron   Lumbar spinal stenosis    Has had some helpful injections       Hyperlipidemia - Primary    Disc goals for lipids and reasons to control them Rev last labs with pt Rev low sat fat diet in detail Labs today  Continues atorvastatin 40 mg daily       Relevant Orders   TSH   Lipid panel   Estrogen deficiency    Dexa ordered       Relevant Orders   DG Bone Density   Elevated glucose    A1c ordered disc imp of low glycemic diet and wt loss to prevent DM2       Relevant Orders   Comprehensive metabolic panel   Hemoglobin A1c   Depression    PHQ 0 Stable/ well controlled Continues fluoxetine 20 mg daily  Encouraged self care Wants to continue this       Current use of proton pump inhibitor    B12 and D levels ordered      Relevant Orders   VITAMIN D 25  Hydroxy (Vit-D  Deficiency, Fractures)   Vitamin B12

## 2022-09-18 NOTE — Assessment & Plan Note (Signed)
Re check cbc with iron  Normocytic, likely chronic dz

## 2022-09-18 NOTE — Assessment & Plan Note (Signed)
Dexa ordered

## 2022-09-18 NOTE — Assessment & Plan Note (Signed)
Dexa ordered On gabapentin  Reviewed dexa 2019  No recent falls or fracture Discussed D3 intake  Discussed exercise

## 2022-09-18 NOTE — Assessment & Plan Note (Signed)
PHQ 0 Stable/ well controlled Continues fluoxetine 20 mg daily  Encouraged self care Wants to continue this

## 2022-09-18 NOTE — Assessment & Plan Note (Signed)
A1c ordered disc imp of low glycemic diet and wt loss to prevent DM2  

## 2022-09-23 NOTE — Progress Notes (Signed)
Remote pacemaker transmission.   

## 2022-09-30 ENCOUNTER — Other Ambulatory Visit: Payer: Self-pay | Admitting: Family Medicine

## 2022-10-06 ENCOUNTER — Telehealth: Payer: Self-pay | Admitting: Family Medicine

## 2022-10-06 NOTE — Telephone Encounter (Signed)
Pt notified of Dr. Royden Purl comments and recommendations. Pt declined to schedule an appt earlier with another provider she only wanted to see PCP. Appt scheduled for Wednesday 10/08/22, ER/ UC precautions given

## 2022-10-06 NOTE — Telephone Encounter (Signed)
Patient contacted the office regarding symptoms, states she is having uti symptoms such as burning with urination and odor. Said she was told to contact the office if she was having symptoms again, she is having root canals done this morning.patient want to know if the uti will interact with her having this done today.[phone]

## 2022-10-06 NOTE — Telephone Encounter (Signed)
I don;t think that will interfere with plan for root canal but she needs to ask the dental provider  They usually prescribe an antibiotic for root canal - depending on what that is it may help? Or interfere with urine test   Regardless of that-please schedule in office appt with first available  Drink water  If any severe symptoms- go to ER   Thanks for letting me know

## 2022-10-07 ENCOUNTER — Ambulatory Visit
Admission: RE | Admit: 2022-10-07 | Discharge: 2022-10-07 | Disposition: A | Discharge status: Home / Self Care | Payer: Medicare Other | Source: Ambulatory Visit | Attending: Family Medicine | Admitting: Family Medicine

## 2022-10-07 DIAGNOSIS — E2839 Other primary ovarian failure: Secondary | ICD-10-CM | POA: Diagnosis not present

## 2022-10-08 ENCOUNTER — Encounter: Payer: Self-pay | Admitting: Family Medicine

## 2022-10-08 ENCOUNTER — Ambulatory Visit (INDEPENDENT_AMBULATORY_CARE_PROVIDER_SITE_OTHER): Payer: Medicare Other | Admitting: Family Medicine

## 2022-10-08 VITALS — BP 110/68 | HR 70 | Temp 97.7°F | Ht 67.0 in | Wt 194.2 lb

## 2022-10-08 DIAGNOSIS — M810 Age-related osteoporosis without current pathological fracture: Secondary | ICD-10-CM | POA: Diagnosis not present

## 2022-10-08 DIAGNOSIS — N3000 Acute cystitis without hematuria: Secondary | ICD-10-CM | POA: Diagnosis not present

## 2022-10-08 DIAGNOSIS — E559 Vitamin D deficiency, unspecified: Secondary | ICD-10-CM

## 2022-10-08 DIAGNOSIS — R300 Dysuria: Secondary | ICD-10-CM | POA: Diagnosis not present

## 2022-10-08 LAB — POC URINALSYSI DIPSTICK (AUTOMATED)
Bilirubin, UA: NEGATIVE
Glucose, UA: NEGATIVE
Ketones, UA: NEGATIVE
Nitrite, UA: NEGATIVE
Protein, UA: NEGATIVE
Spec Grav, UA: 1.015 (ref 1.010–1.025)
Urobilinogen, UA: 0.2 E.U./dL
pH, UA: 6 (ref 5.0–8.0)

## 2022-10-08 MED ORDER — CEPHALEXIN 500 MG PO CAPS: 500.0000 mg | ORAL_CAPSULE | Freq: Two times a day (BID) | ORAL | 0 refills | Status: DC

## 2022-10-08 NOTE — Assessment & Plan Note (Signed)
There was improvement in forearm (formerly osteoporosis) now osteopenia  Slightly worse at hip  No new falls or fracture Discussed need for vit D at least 2000 international units daily  No ca due to constipation / can get from diet  Weight bearing/strength building exercise as tolerated  Fall prevention discussed  Re check 2 y   If fracture consider treatment

## 2022-10-08 NOTE — Patient Instructions (Addendum)
Take 2000 international units of vtiamin D3 daily   (wihout calcium)  This helps bones and overall health   Stay as active as you can safely be  Add some strength training to your routine, this is important for bone and brain health and can reduce your risk of falls and help your body use insulin properly and regulate weight  Light weights, exercise bands , and internet videos are a good way to start  Yoga (chair or regular), machines , floor exercises or a gym with machines are also good options     Drink fluids  Take the generic keflex as directed for uti  If symptoms suddenly worsen let us know   We will contact you with the culture result when it returns   Take care of yourself    For healthy weight  Try to get most of your carbohydrates from produce (with the exception of white potatoes) and whole grains Eat less bread/pasta/rice/snack foods/cereals/sweets and other items from the middle of the grocery store (processed carbs)       Mediterranean Diet  Why follow it? Research shows. Those who follow the Mediterranean diet have a reduced risk of heart disease  The diet is associated with a reduced incidence of Parkinson's and Alzheimer's diseases People following the diet may have longer life expectancies and lower rates of chronic diseases  The Dietary Guidelines for Americans recommends the Mediterranean diet as an eating plan to promote health and prevent disease  What Is the Mediterranean Diet?  Healthy eating plan based on typical foods and recipes of Mediterranean-style cooking The diet is primarily a plant based diet; these foods should make up a majority of meals   Starches - Plant based foods should make up a majority of meals - They are an important sources of vitamins, minerals, energy, antioxidants, and fiber - Choose whole grains, foods high in fiber and minimally processed items  - Typical grain sources include wheat, oats, barley, corn, brown rice, bulgar,  farro, millet, polenta, couscous  - Various types of beans include chickpeas, lentils, fava beans, black beans, white beans   Fruits  Veggies - Large quantities of antioxidant rich fruits & veggies; 6 or more servings  - Vegetables can be eaten raw or lightly drizzled with oil and cooked  - Vegetables common to the traditional Mediterranean Diet include: artichokes, arugula, beets, broccoli, brussel sprouts, cabbage, carrots, celery, collard greens, cucumbers, eggplant, kale, leeks, lemons, lettuce, mushrooms, okra, onions, peas, peppers, potatoes, pumpkin, radishes, rutabaga, shallots, spinach, sweet potatoes, turnips, zucchini - Fruits common to the Mediterranean Diet include: apples, apricots, avocados, cherries, clementines, dates, figs, grapefruits, grapes, melons, nectarines, oranges, peaches, pears, pomegranates, strawberries, tangerines  Fats - Replace butter and margarine with healthy oils, such as olive oil, canola oil, and tahini  - Limit nuts to no more than a handful a day  - Nuts include walnuts, almonds, pecans, pistachios, pine nuts  - Limit or avoid candied, honey roasted or heavily salted nuts - Olives are central to the [employer] - can be eaten whole or used in a variety of dishes   Meats Protein - Limiting red meat: no more than a few times a month - When eating red meat: choose lean cuts and keep the portion to the size of deck of cards - Eggs: approx. 0 to 4 times a week  - Fish and lean poultry: at least 2 a week  - Healthy protein sources include, chicken, [place], lean beef, lamb -  Increase intake of seafood such as tuna, salmon, trout, mackerel, shrimp, scallops - Avoid or limit high fat processed meats such as sausage and bacon  Dairy - Include moderate amounts of low fat dairy products  - Focus on healthy dairy such as fat free yogurt, skim milk, low or reduced fat cheese - Limit dairy products higher in fat such as whole or 2% milk, cheese, ice cream  Alcohol  - Moderate amounts of red wine is ok  - No more than 5 oz daily for women (all ages) and men older than age 16  - No more than 10 oz of wine daily for men younger than 59  Other - Limit sweets and other desserts  - Use herbs and spices instead of salt to flavor foods  - Herbs and spices common to the traditional Mediterranean Diet include: basil, bay leaves, chives, cloves, cumin, fennel, garlic, lavender, marjoram, mint, oregano, parsley, pepper, rosemary, sage, savory, sumac, tarragon, thyme   It's not just a diet, it's a lifestyle:  The Mediterranean diet includes lifestyle factors typical of those in the region  Foods, drinks and meals are best eaten with others and savored Daily physical activity is important for overall good health This could be strenuous exercise like running and aerobics This could also be more leisurely activities such as walking, housework, yard-work, or taking the stairs Moderation is the key; a balanced and healthy diet accommodates most foods and drinks Consider portion sizes and frequency of consumption of certain foods   Meal Ideas & Options:  Breakfast:  Whole wheat toast or whole wheat English muffins with peanut butter & hard boiled egg Steel cut oats topped with apples & cinnamon and skim milk  Fresh fruit: banana, strawberries, melon, berries, peaches  Smoothies: strawberries, bananas, greek yogurt, peanut butter Low fat greek yogurt with blueberries and granola  Egg white omelet with spinach and mushrooms Breakfast couscous: whole wheat couscous, apricots, skim milk, cranberries  Sandwiches:  Hummus and grilled vegetables (peppers, zucchini, squash) on whole wheat bread   Grilled chicken on whole wheat pita with lettuce, tomatoes, cucumbers or tzatziki  [place] salad on whole wheat bread: tuna salad made with greek yogurt, olives, red peppers, capers, green onions Garlic rosemary lamb pita: lamb sauted with garlic, rosemary, salt & pepper; add lettuce,  cucumber, greek yogurt to pita - flavor with lemon juice and black pepper  Seafood:  Mediterranean grilled salmon, seasoned with garlic, basil, parsley, lemon juice and black pepper Shrimp, lemon, and spinach whole-grain pasta salad made with low fat greek yogurt  Seared scallops with lemon orzo  Seared tuna steaks seasoned salt, pepper, coriander topped with tomato mixture of olives, tomatoes, olive oil, minced garlic, parsley, green onions and cappers  Meats:  Herbed greek chicken salad with kalamata olives, cucumber, feta  Red bell peppers stuffed with spinach, bulgur, lean ground beef (or lentils) & topped with feta   Kebabs: skewers of chicken, tomatoes, onions, zucchini, squash  [place] burgers: made with red onions, mint, dill, lemon juice, feta cheese topped with roasted red peppers Vegetarian Cucumber salad: cucumbers, artichoke hearts, celery, red onion, feta cheese, tossed in olive oil & lemon juice  Hummus and whole grain pita points with a greek salad (lettuce, tomato, feta, olives, cucumbers, red onion) Lentil soup with celery, carrots made with vegetable broth, garlic, salt and pepper  Tabouli salad: parsley, bulgur, mint, scallions, cucumbers, tomato, radishes, lemon juice, olive oil, salt and pepper.

## 2022-10-08 NOTE — Assessment & Plan Note (Signed)
Positive urinalysis  Culture pending  Encouraged fluid intake  Keflex 500 mg bid bid for 7d  Update if not starting to improve in a week or if worsening   Call back and Er precautions noted in detail today   Handout given

## 2022-10-08 NOTE — Assessment & Plan Note (Signed)
Last vitamin D Lab Results  Component Value Date   VD25OH 34.77 09/18/2022   Vitamin D level is therapeutic with current supplementation Disc importance of this to bone and overall health Encouraged at least 200 international units D3 daily otc

## 2022-10-08 NOTE — Progress Notes (Signed)
Subjective:    Patient ID: Diana Cook, female    DOB: 05-08-1950, 72 y.o.   MRN: [id number]  HPI  Wt Readings from Last 3 Encounters:  10/08/22 194 lb 3.2 oz (88.1 kg)  09/18/22 194 lb 4 oz (88.1 kg)  07/15/22 194 lb 3.2 oz (88.1 kg)   30.42 kg/m  Vitals:   10/08/22 1123  BP: 110/68  Pulse: 70  Temp: 97.7 F (36.5 C)  SpO2: (!) 84%   Pt presents with urinary symptoms and to review dexa  For 1 1/2 weeks   Burning with urination  Some odor to urine / looks darker  No pink color or blood   More incontinence   Occational bladder hurts this week  No pressure   Has had more back pain but due for epidural as well   Drinking water - working on that    Had a recent root canal  Nmo antibiotic given   Results for orders placed or performed in visit on 10/08/22  POCT Urinalysis Dipstick (Automated)  Result Value Ref Range   Color, UA yellow    Clarity, UA cloudy    Glucose, UA Negative Negative   Bilirubin, UA neg    Ketones, UA neg    Spec Grav, UA 1.015 1.010 - 1.025   Blood, UA 1+    pH, UA 6.0 5.0 - 8.0   Protein, UA Negative Negative   Urobilinogen, UA 0.2 0.2 or 1.0 E.U./dL   Nitrite, UA neg    Leukocytes, UA Large (3+) (A) Negative     Dexa 8/13 FNR T -1.2 Forearm -2.1 (improved 9.6%)   No recent /new fracture  Calcium constipates   Last vitamin D Lab Results  Component Value Date   VD25OH 34.77 09/18/2022      Most recent labs  Lab Results  Component Value Date   NA 135 09/18/2022   K 4.3 09/18/2022   CO2 22 09/18/2022   GLUCOSE 89 09/18/2022   BUN 22 09/18/2022   CREATININE 0.97 09/18/2022   CALCIUM 9.6 09/18/2022   GFR 57.71 (L) 09/18/2022   GFRNONAA >60 04/30/2022   Lab Results  Component Value Date   WBC 6.6 09/18/2022   HGB 12.3 09/18/2022   HCT 38.0 09/18/2022   MCV 90.2 09/18/2022   PLT 323.0 09/18/2022   Lab Results  Component Value Date   IRON 79 09/18/2022   FERRITIN 113.2 11/04/2021   Last vitamin D Lab  Results  Component Value Date   VD25OH 34.77 09/18/2022   Lab Results  Component Value Date   VITAMINB12 484 09/18/2022   Lab Results  Component Value Date   HGBA1C 5.9 09/18/2022   Prediabetes    Patient Active Problem List   Diagnosis Date Noted   Acute cystitis 10/08/2022   Carotid stenosis, symptomatic w/o infarct, right 04/29/2022   Carotid stenosis, symptomatic w/o infarct 03/23/2022   Right shoulder pain 11/25/2021   Mild anemia 11/03/2021   Thyroid nodule 09/17/2021   Sepsis due to gram-negative UTI (HCC) 09/05/2021   Dyslipidemia 09/05/2021   Depression 09/05/2021   GERD without esophagitis 09/05/2021   Vitamin D deficiency 08/20/2021   Colon cancer screening 07/30/2021   Hypercalcemia 07/29/2021   Diarrhea 07/29/2021   Dysuria 07/08/2021   Arterial hypotension 07/08/2021   Paroxysmal atrial fibrillation (HCC) 04/11/2021   Current use of proton pump inhibitor 10/24/2020   Sleep apnea 10/24/2020   Fatigue 10/24/2020   Lightheadedness 05/16/2020   Radicular pain in left  arm 03/02/2020   Neck pain on left side 02/15/2020   Paresthesia 02/15/2020   Chronic respiratory failure with hypoxia (HCC) 11/17/2019   Chronic heart failure with preserved ejection fraction (HFpEF) (HCC) 08/11/2019   Coronary artery disease involving native coronary artery of native heart without angina pectoris 06/20/2019   Gastroesophageal reflux disease    Stomach irritation    Abnormal CT scan, esophagus    Polyp of ascending colon    Personal history of tobacco use, presenting hazards to health 01/18/2018   Grade I diastolic dysfunction 12/21/2017   Globus pharyngeus 12/21/2017   Chronic obstructive pulmonary disease (HCC) 12/15/2017   Hyperlipidemia 12/07/2017   Seizures (HCC) 10/01/2016   Elevated glucose 08/31/2016   Grief reaction 04/11/2016   History of breast cancer 12/11/2015   Osteoporosis 11/22/2015   Estrogen deficiency 09/07/2015   Bruising 09/07/2015   Wrinkles  09/07/2015   Post herpetic neuralgia 09/01/2014   Encounter for Medicare annual wellness exam 07/11/2014   Routine general medical examination at a health care facility 06/26/2013   H/O small bowel obstruction 04/15/2013   Dyspnea on exertion 02/10/2013   Complete heart block (HCC) 11/03/2011   Pacemaker-[employer] 11/03/2011   Lumbar spinal stenosis 09/02/2011   POSTMENOPAUSAL STATUS 08/06/2009   HIDRADENITIS SUPPURATIVA 06/26/2008   HERNIATED DISC 12/14/2007   BACK, LOWER, PAIN 12/02/2007   HX, PERSONAL, COLONIC POLYPS 09/14/2006   Former smoker 06/10/2006   ASTHMA 06/10/2006   H/O idiopathic seizure 06/10/2006   Past Medical History:  Diagnosis Date   Allergic rhinitis    Arthritis    Asthma    as a child, mild now   Breast cancer (HCC) 12/2015   right breast cancer, lumpectomy and mammosite    Colon polyps    colonoscopy 7/08, tubular adenoma   Complete heart block (HCC) 09/2011   s/p PPM implanted in [facility]   COPD (chronic obstructive pulmonary disease) (HCC)    Myocardial infarction (HCC) 2011   Osteopenia 10/2015   Pacemaker    2011   Paroxysmal atrial fibrillation (HCC) 03/2021   Incidentally detected on pacemaker interrogation   Personal history of radiation therapy 2017   right breast ca, mammosite placed   Rotator cuff tear, right 02/2022   Had an MRI in [place] "I have 2 tears."   Seizure disorder (HCC)    Seizures (HCC)    first one was when she was 72 years old    Small bowel obstruction (HCC)    1988 and 2002   Tobacco abuse    Past Surgical History:  Procedure Laterality Date   ABDOMINAL HYSTERECTOMY     BREAST BIOPSY Right 2007   benign inflammatory changes, mass due to underwire bra   BREAST BIOPSY Left 01/02/2016   columnar cell changes without atypical hyperplasia.   BREAST BIOPSY Right 12/06/2015   rt breast mass 10:00, bx done at Dr. Arvella Nigh office, invasive ductal carcinoma   BREAST EXCISIONAL BIOPSY Left 01/02/2016    COLUMNAR CELL CHANGE AND HYPERPLASIA ASSOCIATED WITH LUMINAL AND STROMAL CALCIFICATIONS   BREAST LUMPECTOMY Right 01/02/2016   invasive mammary carcinoma, clear margins, negative LN   BREAST LUMPECTOMY WITH SENTINEL LYMPH NODE BIOPSY Right 01/02/2016   pT1c, N0; ER/ PR 100%; Her 2 neu not over expressed: BREAST LUMPECTOMY WITH SENTINEL LYMPH NODE BX;  Surgeon: Earline Mayotte, MD;  Location: [facility] ORS;  Service: General;  Laterality: Right;   CARDIAC CATHETERIZATION     CAROTID PTA/STENT INTERVENTION Right 04/29/2022  Procedure: CAROTID PTA/STENT INTERVENTION;  Surgeon: Renford Dills, MD;  Location: [facility] INVASIVE CV LAB;  Service: Cardiovascular;  Laterality: Right;   CATARACT EXTRACTION Bilateral    COLONOSCOPY  10/2015   Dr Christella Hartigan   COLONOSCOPY WITH PROPOFOL N/A 08/19/2018   Procedure: COLONOSCOPY WITH PROPOFOL;  Surgeon: Pasty Spillers, MD;  Location: [facility] ENDOSCOPY;  Service: Endoscopy;  Laterality: N/A;   ESOPHAGOGASTRODUODENOSCOPY (EGD) WITH PROPOFOL N/A 08/19/2018   Procedure: ESOPHAGOGASTRODUODENOSCOPY (EGD) WITH PROPOFOL;  Surgeon: Pasty Spillers, MD;  Location: [facility] ENDOSCOPY;  Service: Endoscopy;  Laterality: N/A;   EXPLORATORY LAPAROTOMY  01/25/2001   Exploratory laparotomy, lysis of adhesions, identification of internal hernia secondary to omental adhesion. Prolonged postoperative ileus.   gyn surgery  1993   hysterectomy- form endometriosis   LAPAROSCOPY     PACEMAKER INSERTION  10/24/11   [employer] Advantio dual chamber PPM implanted by Dr Venda Rodes at [facility] in [place]   PPM GENERATOR CHANGEOUT N/A 12/05/2019   Procedure: PPM GENERATOR Janeann Merl;  Surgeon: Duke Salvia, MD;  Location: [facility] INVASIVE CV LAB;  Service: Cardiovascular;  Laterality: N/A;   RIGHT/LEFT HEART CATH AND CORONARY ANGIOGRAPHY Bilateral 07/19/2019   Procedure: RIGHT/LEFT HEART CATH AND CORONARY ANGIOGRAPHY;  Surgeon: Yvonne Kendall, MD;  Location: [facility]  INVASIVE CV LAB;  Service: Cardiovascular;  Laterality: Bilateral;   TEMPORARY PACEMAKER N/A 12/05/2019   Procedure: TEMPORARY PACEMAKER;  Surgeon: Duke Salvia, MD;  Location: [facility] INVASIVE CV LAB;  Service: Cardiovascular;  Laterality: N/A;   Social History   Tobacco Use   Smoking status: Former    Current packs/day: 0.00    Average packs/day: 1.5 packs/day for 42.0 years (63.0 ttl pk-yrs)    Types: Cigarettes    Start date: 11/10/1968    Quit date: 11/11/2010    Years since quitting: 11.9   Smokeless tobacco: Never  Vaping Use   Vaping status: Never Used  Substance Use Topics   Alcohol use: Yes    Alcohol/week: 1.0 - 2.0 standard drink of alcohol    Types: 1 - 2 Glasses of wine per week    Comment: weekly   Drug use: No   Family History  Problem Relation Age of Onset   Stroke Mother    Heart disease Mother 32       MI and CABG   Dementia Mother    Coronary artery disease Father    Parkinsonism Father    Breast cancer Sister    Cancer - Cervical Daughter 100       died 04-08-22   Heart attack Brother 61   Colon cancer Neg Hx    Allergies  Allergen Reactions   Codeine Nausea And Vomiting   Morphine And Codeine Nausea Only   Current Outpatient Medications on File Prior to Visit  Medication Sig Dispense Refill   acetaminophen (TYLENOL) 500 MG tablet Take 500-1,000 mg by mouth every 6 (six) hours as needed for mild pain or headache.      albuterol (VENTOLIN HFA) 108 (90 Base) MCG/ACT inhaler Inhale 2 puffs into the lungs every 4 (four) hours as needed for wheezing or shortness of breath. 18 g 1   apixaban (ELIQUIS) 5 MG TABS tablet Take 1 tablet by mouth twice daily 180 tablet 1   atorvastatin (LIPITOR) 40 MG tablet Take 1 tablet by mouth once daily 90 tablet 1   Cholecalciferol (VITAMIN D3) 50 MCG (2000 UT) capsule Take 2,000 Units by mouth daily.     clopidogrel (  PLAVIX) 75 MG tablet Take 1 tablet by mouth once daily 90 tablet 0   FLUoxetine (PROZAC) 20 MG capsule Take 1  capsule by mouth once daily 90 capsule 2   furosemide (LASIX) 40 MG tablet Take 1 tablet (40 mg total) by mouth daily as needed (swelling, wt gain or needing more oxygen). 90 tablet 0   gabapentin (NEURONTIN) 100 MG capsule Take 100 mg by mouth as needed.     levETIRAcetam (KEPPRA) 250 MG tablet Take 250 mg by mouth 2 (two) times daily.      omeprazole (PRILOSEC) 40 MG capsule Take 40 mg by mouth daily.     OXYGEN Inhale 4 L into the lungs continuous.     Tiotropium Bromide-Olodaterol (STIOLTO RESPIMAT) 2.5-2.5 MCG/ACT AERS INHALE 2 PUFFS BY MOUTH ONCE DAILY 4 g 5   VITAMIN D PO Take 1 tablet by mouth daily.     zolpidem (AMBIEN) 10 MG tablet TAKE 1/2 TO 1 (ONE-HALF TO ONE) TABLET BY MOUTH AT BEDTIME AS NEEDED FOR SLEEP 30 tablet 3   No current facility-administered medications on file prior to visit.    Review of Systems  Constitutional:  Positive for fatigue. Negative for activity change, appetite change and fever.  HENT:  Negative for congestion and sore throat.   Eyes:  Negative for itching and visual disturbance.  Respiratory:  Negative for cough and shortness of breath.   Cardiovascular:  Negative for leg swelling.  Gastrointestinal:  Negative for abdominal distention, abdominal pain, constipation, diarrhea and nausea.  Endocrine: Negative for cold intolerance and polydipsia.  Genitourinary:  Positive for dysuria, frequency and urgency. Negative for difficulty urinating, flank pain and hematuria.       Incontinence   Musculoskeletal:  Negative for myalgias.  Skin:  Negative for rash.  Allergic/Immunologic: Negative for immunocompromised state.  Neurological:  Negative for dizziness and weakness.  Hematological:  Negative for adenopathy.       Objective:   Physical Exam Constitutional:      General: She is not in acute distress.    Appearance: Normal appearance. She is well-developed. She is obese. She is not ill-appearing or diaphoretic.  HENT:     Head: Normocephalic and  atraumatic.  Eyes:     Conjunctiva/sclera: Conjunctivae normal.     Pupils: Pupils are equal, round, and reactive to light.  Cardiovascular:     Rate and Rhythm: Normal rate and regular rhythm.     Heart sounds: Normal heart sounds.  Pulmonary:     Effort: Pulmonary effort is normal.     Breath sounds: Normal breath sounds.  Abdominal:     General: Bowel sounds are normal. There is no distension.     Palpations: Abdomen is soft.     Tenderness: There is abdominal tenderness. There is no rebound.     Comments: No cva tenderness  Mild suprapubic tenderness  Musculoskeletal:     Cervical back: Normal range of motion and neck supple.     Comments: Borderline kyphosis   Lymphadenopathy:     Cervical: No cervical adenopathy.  Skin:    Findings: No rash.  Neurological:     Mental Status: She is alert.           Assessment & Plan:   Problem List Items Addressed This Visit       Musculoskeletal and Integument   Osteoporosis    There was improvement in forearm (formerly osteoporosis) now osteopenia  Slightly worse at hip  No new falls or fracture  Discussed need for vit D at least 2000 international units daily  No ca due to constipation / can get from diet  Weight bearing/strength building exercise as tolerated  Fall prevention discussed  Re check 2 y   If fracture consider treatment        Relevant Medications   Cholecalciferol (VITAMIN D3) 50 MCG (2000 UT) capsule     Genitourinary   Acute cystitis    Positive urinalysis  Culture pending  Encouraged fluid intake  Keflex 500 mg bid bid for 7d  Update if not starting to improve in a week or if worsening   Call back and Er precautions noted in detail today   Handout given       Relevant Orders   Urine Culture     Other   Vitamin D deficiency    Last vitamin D Lab Results  Component Value Date   VD25OH 34.77 09/18/2022   Vitamin D level is therapeutic with current supplementation Disc importance of  this to bone and overall health Encouraged at least 200 international units D3 daily otc      Dysuria - Primary   Relevant Orders   POCT Urinalysis Dipstick (Automated) (Completed)

## 2022-10-10 LAB — URINE CULTURE
MICRO NUMBER:: 15329995
SPECIMEN QUALITY:: ADEQUATE

## 2022-11-06 ENCOUNTER — Other Ambulatory Visit: Payer: Self-pay | Admitting: Pulmonary Disease

## 2022-11-17 ENCOUNTER — Ambulatory Visit
Admission: RE | Admit: 2022-11-17 | Discharge: 2022-11-17 | Disposition: A | Discharge status: Home / Self Care | Payer: Medicare Other | Source: Ambulatory Visit | Attending: Radiation Oncology | Admitting: Radiation Oncology

## 2022-11-17 DIAGNOSIS — R911 Solitary pulmonary nodule: Secondary | ICD-10-CM | POA: Diagnosis present

## 2022-11-17 LAB — POCT I-STAT CREATININE: Creatinine, Ser: 1.1 mg/dL — ABNORMAL HIGH (ref 0.44–1.00)

## 2022-11-17 MED ORDER — IOHEXOL 300 MG/ML  SOLN
75.0000 mL | Freq: Once | INTRAMUSCULAR | Status: AC | PRN
  Administered 2022-11-17: 75 mL via INTRAVENOUS

## 2022-11-19 ENCOUNTER — Encounter: Payer: Self-pay | Admitting: Pulmonary Disease

## 2022-11-19 ENCOUNTER — Ambulatory Visit: Payer: Medicare Other | Admitting: Pulmonary Disease

## 2022-11-19 VITALS — BP 100/60 | HR 73 | Temp 97.6°F | Ht 67.0 in | Wt 197.4 lb

## 2022-11-19 DIAGNOSIS — J432 Centrilobular emphysema: Secondary | ICD-10-CM | POA: Diagnosis not present

## 2022-11-19 DIAGNOSIS — C3491 Malignant neoplasm of unspecified part of right bronchus or lung: Secondary | ICD-10-CM | POA: Diagnosis not present

## 2022-11-19 DIAGNOSIS — I442 Atrioventricular block, complete: Secondary | ICD-10-CM

## 2022-11-19 DIAGNOSIS — J9611 Chronic respiratory failure with hypoxia: Secondary | ICD-10-CM

## 2022-11-19 DIAGNOSIS — J440 Chronic obstructive pulmonary disease with acute lower respiratory infection: Secondary | ICD-10-CM | POA: Diagnosis not present

## 2022-11-19 DIAGNOSIS — J209 Acute bronchitis, unspecified: Secondary | ICD-10-CM

## 2022-11-19 MED ORDER — DOXYCYCLINE HYCLATE 100 MG PO TABS: 100.0000 mg | ORAL_TABLET | Freq: Two times a day (BID) | ORAL | 0 refills | Status: AC

## 2022-11-19 NOTE — Patient Instructions (Signed)
Continue using your oxygen as you are doing.  Have sent a prescription for doxycycline to your pharmacy.  Also refilled your omeprazole.  We are reassessing your lung function with breathing tests.  We will see you in follow-up in 4 months time call sooner should any new problems arise.

## 2022-11-19 NOTE — Progress Notes (Unsigned)
Subjective:    Patient ID: Diana Cook, female    DOB: 11-26-1950, 72 y.o.   MRN: [id number]  Patient Care Team: Tower, Audrie Gallus, MD as PCP - General (Family Medicine) End, Cristal Deer, MD as PCP - Cardiology (Cardiology) Duke Salvia, MD as Consulting Physician (Cardiology) Irene Limbo., MD as Referring Physician (Ophthalmology) Tower, Audrie Gallus, MD as Consulting Physician (Family Medicine) Lemar Livings Merrily Pew, MD (General Surgery)  Chief Complaint  Patient presents with   Follow-up    Cough with white phlegm. Shortness of breath on exertion.    HPI The patient is a 72 year old former smoker (quit 2012, 42 PY) who follows here for the issue of dyspnea on exertion in the setting of emphysema with preserved FEV1.  She also has chronic respiratory failure with hypoxia related to emphysema. This is a scheduled follow-up visit.  Last seen on 15 Jul 2022, she has completed SBRT for a lung nodule that was PET avid.  She underwent follow-up CT chest on 02 June 2022 showing radiation changes on the right upper lobe with the nodule no longer discretely visualized.  She had another follow-up CT on 17 November 2022 that has not been interpreted by radiology yet however, on my review it appears that the nodule is no longer seen and the previously noted radiation changes appear to be resolving.  With regards to her chronic respiratory failure she is compliant with oxygen with activity and sleep. She continues to do well with this.  She does continue to have shortness of breath on exertion.  Over the last week she has noted cough productive of initially white phlegm and now productive of yellowish to greenish sputum.  No wheezing.   She has a prior history of complete heart block and is status post pacemaker placement had pacer revision in October 2021 by Dr. Graciela Husbands.  On a prior device check she was noted to have some atrial arrhythmias and PAF and has been now started on Eliquis.  She is compliant with  Stiolto and as needed albuterol.  Has not had any fevers, chills or sweats.  No calf tenderness or lower extremity edema.  No sleep disturbance.  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  She is not on inhaled corticosteroids.  She is being managed with PPI for gastroesophageal reflux.  She had not been taking omeprazole consistently and had noticed worsening of her hoarseness.   Review of Systems A 10 point review of systems was performed and it is as noted above otherwise negative.   Patient Active Problem List   Diagnosis Date Noted   Acute cystitis 10/08/2022   Carotid stenosis, symptomatic w/o infarct, right 04/29/2022   Carotid stenosis, symptomatic w/o infarct 03/23/2022   Right shoulder pain 11/25/2021   Mild anemia 11/03/2021   Thyroid nodule 09/17/2021   Sepsis due to gram-negative UTI (HCC) 09/05/2021   Dyslipidemia 09/05/2021   Depression 09/05/2021   GERD without esophagitis 09/05/2021   Vitamin D deficiency 08/20/2021   Colon cancer screening 07/30/2021   Hypercalcemia 07/29/2021   Diarrhea 07/29/2021   Dysuria 07/08/2021   Arterial hypotension 07/08/2021   Paroxysmal atrial fibrillation (HCC) 04/11/2021   Current use of proton pump inhibitor 10/24/2020   Sleep apnea 10/24/2020   Fatigue 10/24/2020   Lightheadedness 05/16/2020   Radicular  pain in left arm 03/02/2020   Neck pain on left side 02/15/2020   Paresthesia 02/15/2020   Chronic respiratory failure with hypoxia (HCC) 11/17/2019   Chronic heart failure with preserved ejection fraction (HFpEF) (HCC) 08/11/2019   Coronary artery disease involving native coronary artery of native heart without angina pectoris 06/20/2019   Gastroesophageal reflux disease    Stomach irritation    Abnormal CT scan, esophagus    Polyp of ascending colon    Personal  history of tobacco use, presenting hazards to health 01/18/2018   Grade I diastolic dysfunction 12/21/2017   Globus pharyngeus 12/21/2017   Chronic obstructive pulmonary disease (HCC) 12/15/2017   Hyperlipidemia 12/07/2017   Seizures (HCC) 10/01/2016   Elevated glucose 08/31/2016   Grief reaction 04/11/2016   History of breast cancer 12/11/2015   Osteoporosis 11/22/2015   Estrogen deficiency 09/07/2015   Bruising 09/07/2015   Wrinkles 09/07/2015   Post herpetic neuralgia 09/01/2014   Encounter for Medicare annual wellness exam 07/11/2014   Routine general medical examination at a health care facility 06/26/2013   H/O small bowel obstruction 04/15/2013   Dyspnea on exertion 02/10/2013   Complete heart block (HCC) 11/03/2011   Pacemaker-[employer] 11/03/2011   Lumbar spinal stenosis 09/02/2011   POSTMENOPAUSAL STATUS 08/06/2009   HIDRADENITIS SUPPURATIVA 06/26/2008   HERNIATED DISC 12/14/2007   BACK, LOWER, PAIN 12/02/2007   HX, PERSONAL, COLONIC POLYPS 09/14/2006   Former smoker 06/10/2006   ASTHMA 06/10/2006   H/O idiopathic seizure 06/10/2006    Social History   Tobacco Use   Smoking status: Former    Current packs/day: 0.00    Average packs/day: 1.5 packs/day for 42.0 years (63.0 ttl pk-yrs)    Types: Cigarettes    Start date: 11/10/1968    Quit date: 11/11/2010    Years since quitting: 12.0   Smokeless tobacco: Never  Substance Use Topics   Alcohol use: Yes    Alcohol/week: 1.0 - 2.0 standard drink of alcohol    Types: 1 - 2 Glasses of wine per week    Comment: weekly    Allergies  Allergen Reactions   Codeine Nausea And Vomiting   Morphine And Codeine Nausea Only    Current Meds  Medication Sig   acetaminophen (TYLENOL) 500 MG tablet Take 500-1,000 mg by mouth every 6 (six) hours as needed for mild pain or headache.    albuterol (VENTOLIN HFA) 108 (90 Base) MCG/ACT inhaler Inhale 2 puffs into the lungs every 4 (four) hours as needed for wheezing or  shortness of breath.   apixaban (ELIQUIS) 5 MG TABS tablet Take 1 tablet by mouth twice daily   atorvastatin (LIPITOR) 40 MG tablet Take 1 tablet by mouth once daily   cephALEXin (KEFLEX) 500 MG capsule Take 1 capsule (500 mg total) by mouth 2 (two) times daily.   Cholecalciferol (VITAMIN D3) 50 MCG (2000 UT) capsule Take 2,000 Units by mouth daily.   clopidogrel (PLAVIX) 75 MG tablet Take 1 tablet by mouth once daily   FLUoxetine (PROZAC) 20 MG capsule Take 1 capsule by mouth once daily   furosemide (LASIX) 40 MG tablet Take 1 tablet (40 mg total) by mouth daily as needed (swelling, wt gain or needing more oxygen).   gabapentin (NEURONTIN) 100 MG capsule Take 100 mg by mouth as needed.   levETIRAcetam (KEPPRA) 250 MG tablet Take 250 mg by mouth 2 (two) times daily.    omeprazole (PRILOSEC) 40 MG capsule Take 40 mg by mouth daily.   OXYGEN  Inhale 4 L into the lungs continuous.   STIOLTO RESPIMAT 2.5-2.5 MCG/ACT AERS INHALE 2 PUFFS BY MOUTH ONCE DAILY   zolpidem (AMBIEN) 10 MG tablet TAKE 1/2 TO 1 (ONE-HALF TO ONE) TABLET BY MOUTH AT BEDTIME AS NEEDED FOR SLEEP    Immunization History  Administered Date(s) Administered   Fluad Quad(high Dose 65+) 10/24/2020   Influenza Whole 02/24/2005   Influenza, High Dose Seasonal PF 11/04/2018, 11/22/2019   Influenza,inj,Quad PF,6+ Mos 12/03/2012, 12/09/2013, 01/16/2015, 02/06/2016, 11/04/2016, 12/08/2017   Influenza-Unspecified 10/25/2021   PFIZER Comirnaty(Gray Top)Covid-19 Tri-Sucrose Vaccine 07/06/2020   PFIZER(Purple Top)SARS-COV-2 Vaccination 02/28/2019, 03/21/2019, 11/14/2019   Pneumococcal Conjugate-13 07/11/2014   Pneumococcal Polysaccharide-23 03/21/2005, 07/26/2015   Td 01/09/1999, 06/14/2007   Zoster, Live 09/07/2015        Objective:   BP 100/60 (BP Location: Left Arm, Patient Position: Sitting, Cuff Size: Normal)   Pulse 73   Temp 97.6 F (36.4 C) (Temporal)   Ht 5\' 7"  (1.702 m)   Wt 197 lb 6.4 oz (89.5 kg)   SpO2 92%   BMI  30.92 kg/m   SpO2: 92 % O2 Device: None (Room air)  GENERAL: Well-developed well-nourished woman in no acute respiratory distress.  She is fully ambulatory.  No conversational dyspnea.  No hoarseness noted today. HEAD: Normocephalic, atraumatic. EYES: Pupils equal, round, reactive to light.  No scleral icterus. MOUTH: Tissue intact.  No thrush. NECK: Supple. No thyromegaly. No nodules. No JVD.  Trachea midline. PULMONARY: Symmetrical air entry, some scattered rhonchi throughout. CARDIOVASCULAR: S1 and S2. Regular rate and rhythm. No rubs murmurs gallops heard. GASTROINTESTINAL: Nondistended. MUSCULOSKELETAL: No joint deformity, no clubbing, no edema. NEUROLOGIC: Awake, alert, oriented x4, no focal deficits. SKIN: Intact,warm,dry, on limited exam no rashes. PSYCH: Normal mood and behavior.   Assessment & Plan:     ICD-10-CM   1. Centrilobular emphysema (HCC)  J43.2 Pulmonary Function Test [facility] Only   Reassess with PFTs Continue Stiolto 2 inhalations daily Continue as needed albuterol    2. Chronic respiratory failure with hypoxia (HCC)  J96.11 Pulmonary Function Test [facility] Only   Continue oxygen supplementation Patient is compliant Notes benefit of therapy 2 L/min with exertion and during sleep    3. Acute bronchitis with COPD (HCC)  J44.0    J20.9    Doxycycline 100 mg p.o. twice daily x 7 days    4. Non-small cell cancer of right lung (HCC) -presumed  C34.91    Status post SBRT Chest CT shows resolution of nodule    5. Complete heart block (HCC)  I44.2    This issue adds complexity to her management Status post pacemaker Follows with cardiology     Orders Placed This Encounter  Procedures   Pulmonary Function Test [facility] Only    Standing Status:   Future    Standing Expiration Date:   11/19/2023    Order Specific Question:   Full PFT: includes the following: basic spirometry, spirometry pre & post bronchodilator, diffusion capacity (DLCO), lung volumes    Answer:    Full PFT    Order Specific Question:   This test can only be performed at    Answer:   [facility]   Meds ordered this encounter  Medications   doxycycline (VIBRA-TABS) 100 MG tablet    Sig: Take 1 tablet (100 mg total) by mouth 2 (two) times daily for 7 days. Avoid direct exposure to the sun while on this medication.    Dispense:  14 tablet    Refill:  0   pantoprazole (PROTONIX) 40 MG tablet    Sig: Take 1 tablet (40 mg total) by mouth daily.    Dispense:  90 tablet    Refill:  3   Will reassess the patient's COPD with PFTs.  She notes that hoarseness is markedly improved when she takes PPI daily.  She is on Plavix and omeprazole does interfere with Plavix function therefore, we will switch to pantoprazole.  Patient has a mild acute bronchitis, a prescription of doxycycline was sent to her pharmacy.  Will see the patient in follow-up in 4 months time she is to call sooner should any new problems arise.   Gailen Shelter, MD Advanced Bronchoscopy PCCM [facility]    *This note was dictated using voice recognition software/Dragon.  Despite best efforts to proofread, errors can occur which can change the meaning. Any transcriptional errors that result from this process are unintentional and may not be fully corrected at the time of dictation.

## 2022-11-20 ENCOUNTER — Encounter: Payer: Self-pay | Admitting: Pulmonary Disease

## 2022-11-20 MED ORDER — PANTOPRAZOLE SODIUM 40 MG PO TBEC: 40.0000 mg | DELAYED_RELEASE_TABLET | Freq: Every day | ORAL | 3 refills | Status: DC

## 2022-11-25 ENCOUNTER — Encounter: Payer: Self-pay | Admitting: Internal Medicine

## 2022-11-25 ENCOUNTER — Ambulatory Visit: Payer: Medicare Other | Attending: Internal Medicine | Admitting: Internal Medicine

## 2022-11-25 VITALS — BP 110/60 | HR 72 | Ht 68.0 in | Wt 197.5 lb

## 2022-11-25 DIAGNOSIS — I442 Atrioventricular block, complete: Secondary | ICD-10-CM

## 2022-11-25 DIAGNOSIS — I2510 Atherosclerotic heart disease of native coronary artery without angina pectoris: Secondary | ICD-10-CM

## 2022-11-25 DIAGNOSIS — I5032 Chronic diastolic (congestive) heart failure: Secondary | ICD-10-CM

## 2022-11-25 DIAGNOSIS — E7800 Pure hypercholesterolemia, unspecified: Secondary | ICD-10-CM

## 2022-11-25 DIAGNOSIS — I480 Paroxysmal atrial fibrillation: Secondary | ICD-10-CM

## 2022-11-25 LAB — CUP PACEART INCLINIC DEVICE CHECK
Date Time Interrogation Session: 20241001134459
Implantable Lead Connection Status: 753985
Implantable Lead Connection Status: 753985
Implantable Lead Implant Date: 20130830
Implantable Lead Implant Date: 20130830
Implantable Lead Location: 753859
Implantable Lead Location: 753860
Implantable Lead Model: 4456
Implantable Lead Model: 4479
Implantable Lead Serial Number: 473325
Implantable Lead Serial Number: 523784
Implantable Pulse Generator Implant Date: 20211011
Lead Channel Impedance Value: 471 Ohm
Lead Channel Impedance Value: 602 Ohm
Lead Channel Pacing Threshold Amplitude: 0.4 V
Lead Channel Pacing Threshold Amplitude: 0.7 V
Lead Channel Pacing Threshold Pulse Width: 0.4 ms
Lead Channel Pacing Threshold Pulse Width: 0.4 ms
Lead Channel Sensing Intrinsic Amplitude: 3.7 mV
Lead Channel Setting Pacing Amplitude: 2 V
Lead Channel Setting Pacing Amplitude: 2 V
Lead Channel Setting Pacing Pulse Width: 0.4 ms
Lead Channel Setting Sensing Sensitivity: 2.5 mV
Pulse Gen Serial Number: 949303
Zone Setting Status: 755011

## 2022-11-25 NOTE — Patient Instructions (Signed)
Medication Instructions:  The current medical regimen is effective;  continue present plan and medications.  *If you need a refill on your cardiac medications before your next appointment, please call your pharmacy*   Follow-Up: At [facility], you and your health needs are our priority.  As part of our continuing mission to provide you with exceptional heart care, we have created designated Provider Care Teams.  These Care Teams include your primary Cardiologist (physician) and Advanced Practice Providers (APPs -  Physician Assistants and Nurse Practitioners) who all work together to provide you with the care you need, when you need it.  We recommend signing up for the patient portal called "MyChart".  Sign up information is provided on this After Visit Summary.  MyChart is used to connect with patients for Virtual Visits (Telemedicine).  Patients are able to view lab/test results, encounter notes, upcoming appointments, etc.  Non-urgent messages can be sent to your provider as well.   To learn more about what you can do with MyChart, go to [email or web address].    Your next appointment:   10 month(s)  Provider:   Sherryl Manges, MD

## 2022-11-25 NOTE — Progress Notes (Signed)
Patient ID: Diana Cook, female   DOB: Oct 14, 1950, 72 y.o.   MRN: [id number]      Patient Care Team: Tower, Audrie Gallus, MD as PCP - General (Family Medicine) End, Cristal Deer, MD as PCP - Cardiology (Cardiology) Duke Salvia, MD as Consulting Physician (Cardiology) Irene Limbo., MD as Referring Physician (Ophthalmology) Tower, Audrie Gallus, MD as Consulting Physician (Family Medicine) Lemar Livings Merrily Pew, MD (General Surgery)   HPI  [facility] D[facility]ak is a 72 y.o. female Seen by both Drs. Allred and Ladona Ridgel in the past who developed complete heart block and underwent pacing fall 2013 [facility]; underwent gen change 10/21;   Orthostatic hypotension >> Eval for orthostasis and CHB  >>   SPEP normal; cortisol normal    Her daughter, Diana Cook,  developed cancer;  died March 20, 2022; devastating.  She meets with her grandchildren regularly for breakfast.S    Not able to exercise 2/2 back pain; gaining weight   Granddaughter Diana Cook  at  [employer] studying for dental school  Diana Cook  going to [employer] on )  Lassitude exercise intolerance with weakness upon standing.  Aggravated by recent UTI.  Also orthopedic limitations to effort DATE TEST EF   9/13 LHC  No Obstr CAD ( by report)   9/13 Echo   55-65 %   12/14 Echo   55-65 %   8/18 Echo  55-60%   7/19 Echo  50-55%   10/20 CTA  CaScore 521 ? Severe stenosis  5/21 Echo  55-60%   5/21 LHC  LADm-30: OM2p-20  5/23 Echo  55-60%     Date Cr K Hgb  8//18 0.83 4.4    10/19 0.95 4.5   1/22 1.06 5.1   6/23 1.08 5.0 13.5  7/24 1.1 4.3 12.3<<10.5     Past Medical History:  Diagnosis Date   Allergic rhinitis    Arthritis    Asthma    as a child, mild now   Breast cancer (HCC) 12/2015   right breast cancer, lumpectomy and mammosite    Colon polyps    colonoscopy 7/08, tubular adenoma   Complete heart block (HCC) 09/2011   s/p PPM implanted in [facility]   COPD (chronic obstructive pulmonary disease) (HCC)     Myocardial infarction (HCC) 2011   Osteopenia 10/2015   Pacemaker    2011   Paroxysmal atrial fibrillation (HCC) 03/2021   Incidentally detected on pacemaker interrogation   Personal history of radiation therapy 2017   right breast ca, mammosite placed   Rotator cuff tear, right 02/2022   Had an MRI in [place] "I have 2 tears."   Seizure disorder (HCC)    Seizures (HCC)    first one was when she was 72 years old    Small bowel obstruction (HCC)    1988 and 2002   Tobacco abuse     Past Surgical History:  Procedure Laterality Date   ABDOMINAL HYSTERECTOMY     BREAST BIOPSY Right 2007   benign inflammatory changes, mass due to underwire bra   BREAST BIOPSY Left 01/02/2016   columnar cell changes without atypical hyperplasia.   BREAST BIOPSY Right 12/06/2015   rt breast mass 10:00, bx done at Dr. Arvella Nigh office, invasive ductal carcinoma   BREAST EXCISIONAL BIOPSY Left 01/02/2016   COLUMNAR CELL CHANGE AND HYPERPLASIA ASSOCIATED WITH LUMINAL AND STROMAL CALCIFICATIONS   BREAST LUMPECTOMY Right 01/02/2016   invasive mammary carcinoma, clear margins, negative LN   BREAST  LUMPECTOMY WITH SENTINEL LYMPH NODE BIOPSY Right 01/02/2016   pT1c, N0; ER/ PR 100%; Her 2 neu not over expressed: BREAST LUMPECTOMY WITH SENTINEL LYMPH NODE BX;  Surgeon: Earline Mayotte, MD;  Location: [facility] ORS;  Service: General;  Laterality: Right;   CARDIAC CATHETERIZATION     CAROTID PTA/STENT INTERVENTION Right 04/29/2022   Procedure: CAROTID PTA/STENT INTERVENTION;  Surgeon: Renford Dills, MD;  Location: [facility] INVASIVE CV LAB;  Service: Cardiovascular;  Laterality: Right;   CATARACT EXTRACTION Bilateral    COLONOSCOPY  10/2015   Dr Christella Hartigan   COLONOSCOPY WITH PROPOFOL N/A 08/19/2018   Procedure: COLONOSCOPY WITH PROPOFOL;  Surgeon: Pasty Spillers, MD;  Location: [facility] ENDOSCOPY;  Service: Endoscopy;  Laterality: N/A;   ESOPHAGOGASTRODUODENOSCOPY (EGD) WITH PROPOFOL N/A 08/19/2018   Procedure:  ESOPHAGOGASTRODUODENOSCOPY (EGD) WITH PROPOFOL;  Surgeon: Pasty Spillers, MD;  Location: [facility] ENDOSCOPY;  Service: Endoscopy;  Laterality: N/A;   EXPLORATORY LAPAROTOMY  01/25/2001   Exploratory laparotomy, lysis of adhesions, identification of internal hernia secondary to omental adhesion. Prolonged postoperative ileus.   gyn surgery  1993   hysterectomy- form endometriosis   LAPAROSCOPY     PACEMAKER INSERTION  10/24/11   [employer] Advantio dual chamber PPM implanted by Dr Venda Rodes at [facility] in [place]   PPM GENERATOR CHANGEOUT N/A 12/05/2019   Procedure: PPM GENERATOR Janeann Merl;  Surgeon: Duke Salvia, MD;  Location: [facility] INVASIVE CV LAB;  Service: Cardiovascular;  Laterality: N/A;   RIGHT/LEFT HEART CATH AND CORONARY ANGIOGRAPHY Bilateral 07/19/2019   Procedure: RIGHT/LEFT HEART CATH AND CORONARY ANGIOGRAPHY;  Surgeon: Yvonne Kendall, MD;  Location: [facility] INVASIVE CV LAB;  Service: Cardiovascular;  Laterality: Bilateral;   TEMPORARY PACEMAKER N/A 12/05/2019   Procedure: TEMPORARY PACEMAKER;  Surgeon: Duke Salvia, MD;  Location: [facility] INVASIVE CV LAB;  Service: Cardiovascular;  Laterality: N/A;    Current Outpatient Medications  Medication Sig Dispense Refill   acetaminophen (TYLENOL) 500 MG tablet Take 500-1,000 mg by mouth every 6 (six) hours as needed for mild pain or headache.      albuterol (VENTOLIN HFA) 108 (90 Base) MCG/ACT inhaler Inhale 2 puffs into the lungs every 4 (four) hours as needed for wheezing or shortness of breath. 18 g 1   apixaban (ELIQUIS) 5 MG TABS tablet Take 1 tablet by mouth twice daily 180 tablet 1   atorvastatin (LIPITOR) 40 MG tablet Take 1 tablet by mouth once daily 90 tablet 1   cephALEXin (KEFLEX) 500 MG capsule Take 1 capsule (500 mg total) by mouth 2 (two) times daily. 14 capsule 0   Cholecalciferol (VITAMIN D3) 50 MCG (2000 UT) capsule Take 2,000 Units by mouth daily.     clopidogrel (PLAVIX) 75 MG tablet Take 1  tablet by mouth once daily 90 tablet 0   doxycycline (VIBRA-TABS) 100 MG tablet Take 1 tablet (100 mg total) by mouth 2 (two) times daily for 7 days. Avoid direct exposure to the sun while on this medication. 14 tablet 0   FLUoxetine (PROZAC) 20 MG capsule Take 1 capsule by mouth once daily 90 capsule 2   furosemide (LASIX) 40 MG tablet Take 1 tablet (40 mg total) by mouth daily as needed (swelling, wt gain or needing more oxygen). 90 tablet 0   gabapentin (NEURONTIN) 100 MG capsule Take 100 mg by mouth as needed.     levETIRAcetam (KEPPRA) 250 MG tablet Take 250 mg by mouth 2 (two) times daily.      OXYGEN Inhale 4  L into the lungs continuous.     pantoprazole (PROTONIX) 40 MG tablet Take 1 tablet (40 mg total) by mouth daily. 90 tablet 3   STIOLTO RESPIMAT 2.5-2.5 MCG/ACT AERS INHALE 2 PUFFS BY MOUTH ONCE DAILY 12 g 0   zolpidem (AMBIEN) 10 MG tablet TAKE 1/2 TO 1 (ONE-HALF TO ONE) TABLET BY MOUTH AT BEDTIME AS NEEDED FOR SLEEP 30 tablet 3   No current facility-administered medications for this visit.    Allergies  Allergen Reactions   Codeine Nausea And Vomiting   Morphine And Codeine Nausea Only    Review of Systems negative except from HPI and PMH  Physical Exam: BP 110/60 (BP Location: Left Arm, Patient Position: Sitting, Cuff Size: Large)   Pulse 72   Ht 5\' 8"  (1.727 m)   Wt 197 lb 8 oz (89.6 kg)   SpO2 97%   BMI 30.03 kg/m  Well developed and well nourished in no acute distress wearing O2 HENT normal Neck supple with JVP-flat Clear Device pocket well healed; without hematoma or erythema.  There is no tethering  Regular rate and rhythm, no  gallop No / murmur Abd-soft with active BS No Clubbing cyanosis  edema Skin-warm and dry A & Oriented  Grossly normal sensory and motor function  ECG sinus with P synchronous pacing at 72  Device function is normal. Programming changes none  See Paceart for details      Assessment and  Plan:  High-grade heart  block  Pacemaker-[employer]    Exercise intolerance  Orthostatic hypotension// presyncope  Ventricular tachycardia-nonsustained  Grieving  Atrial fibrillation   Orthostasis relatively quiescient  Shortness of breath chronic on oxygen  No interval tachycardia  Atrial fibrillation previously detected on her device, not on this interrogation.  Continue apixaban   Sinus node dysfunction now with adequate heart rate excursion    Encouraged her to go back to church and not care about her oxygen machine making noise

## 2022-11-30 NOTE — Progress Notes (Signed)
MRN : [id number]  Diana Cook is a 72 y.o. (08-14-1950) female who presents with chief complaint of check carotid arteries.  History of Present Illness:   The patient is seen for follow up evaluation of carotid stenosis status post right carotid stent on 04/29/2022.     Procedure:  Placement of a 9 x 7 x 40 exact stent with the use of the NAV-6 embolic protection device in the right internal carotid artery   There were no post operative problems or complications related to the surgery.  The patient denies neck or incisional pain.   The patient denies interval amaurosis fugax. There is no recent history of TIA symptoms or focal motor deficits. There is no prior documented CVA.   The patient denies headache.   The patient is taking enteric-coated aspirin 81 mg daily.   No recent shortening of the patient's walking distance or new symptoms consistent with claudication.  No history of rest pain symptoms. No new ulcers or wounds of the lower extremities have occurred.   There is no history of DVT, PE or superficial thrombophlebitis. No recent episodes of angina or shortness of breath documented.   Duplex ultrasound of the carotid arteries obtained today demonstrates 1 to 39% stenosis bilaterally with antegrade vertebral artery flow.  No significant change compared to the previous study  No outpatient medications have been marked as taking for the 12/01/22 encounter (Appointment) with Gilda Crease, Latina Craver, MD.    Past Medical History:  Diagnosis Date   Allergic rhinitis    Arthritis    Asthma    as a child, mild now   Breast cancer (HCC) 12/2015   right breast cancer, lumpectomy and mammosite    Colon polyps    colonoscopy 7/08, tubular adenoma   Complete heart block (HCC) 09/2011   s/p PPM implanted in [facility]   COPD (chronic obstructive pulmonary disease) (HCC)    Myocardial infarction (HCC) 2011   Osteopenia 10/2015   Pacemaker    2011    Paroxysmal atrial fibrillation (HCC) 03/2021   Incidentally detected on pacemaker interrogation   Personal history of radiation therapy 2017   right breast ca, mammosite placed   Rotator cuff tear, right 02/2022   Had an MRI in [place] "I have 2 tears."   Seizure disorder (HCC)    Seizures (HCC)    first one was when she was 72 years old    Small bowel obstruction (HCC)    1988 and 2002   Tobacco abuse     Past Surgical History:  Procedure Laterality Date   ABDOMINAL HYSTERECTOMY     BREAST BIOPSY Right 2007   benign inflammatory changes, mass due to underwire bra   BREAST BIOPSY Left 01/02/2016   columnar cell changes without atypical hyperplasia.   BREAST BIOPSY Right 12/06/2015   rt breast mass 10:00, bx done at Dr. Arvella Nigh office, invasive ductal carcinoma   BREAST EXCISIONAL BIOPSY Left 01/02/2016   COLUMNAR CELL CHANGE AND HYPERPLASIA ASSOCIATED WITH LUMINAL AND STROMAL CALCIFICATIONS   BREAST LUMPECTOMY Right 01/02/2016   invasive mammary carcinoma, clear margins, negative LN   BREAST LUMPECTOMY WITH SENTINEL LYMPH NODE BIOPSY Right 01/02/2016   pT1c, N0; ER/ PR 100%; Her 2 neu not over expressed: BREAST LUMPECTOMY WITH SENTINEL LYMPH NODE BX;  Surgeon: Earline Mayotte, MD;  Location: [facility] ORS;  Service: General;  Laterality: Right;   CARDIAC CATHETERIZATION     CAROTID PTA/STENT INTERVENTION Right 04/29/2022   Procedure: CAROTID PTA/STENT INTERVENTION;  Surgeon: Renford Dills, MD;  Location: [facility] INVASIVE CV LAB;  Service: Cardiovascular;  Laterality: Right;   CATARACT EXTRACTION Bilateral    COLONOSCOPY  10/2015   Dr Christella Hartigan   COLONOSCOPY WITH PROPOFOL N/A 08/19/2018   Procedure: COLONOSCOPY WITH PROPOFOL;  Surgeon: Pasty Spillers, MD;  Location: [facility] ENDOSCOPY;  Service: Endoscopy;  Laterality: N/A;   ESOPHAGOGASTRODUODENOSCOPY (EGD) WITH PROPOFOL N/A 08/19/2018   Procedure: ESOPHAGOGASTRODUODENOSCOPY (EGD) WITH PROPOFOL;  Surgeon: Pasty Spillers, MD;   Location: [facility] ENDOSCOPY;  Service: Endoscopy;  Laterality: N/A;   EXPLORATORY LAPAROTOMY  01/25/2001   Exploratory laparotomy, lysis of adhesions, identification of internal hernia secondary to omental adhesion. Prolonged postoperative ileus.   gyn surgery  1993   hysterectomy- form endometriosis   LAPAROSCOPY     PACEMAKER INSERTION  10/24/11   [employer] Advantio dual chamber PPM implanted by Dr Venda Rodes at [facility] in [place]   PPM GENERATOR CHANGEOUT N/A 12/05/2019   Procedure: PPM GENERATOR Janeann Merl;  Surgeon: Duke Salvia, MD;  Location: [facility] INVASIVE CV LAB;  Service: Cardiovascular;  Laterality: N/A;   RIGHT/LEFT HEART CATH AND CORONARY ANGIOGRAPHY Bilateral 07/19/2019   Procedure: RIGHT/LEFT HEART CATH AND CORONARY ANGIOGRAPHY;  Surgeon: Yvonne Kendall, MD;  Location: [facility] INVASIVE CV LAB;  Service: Cardiovascular;  Laterality: Bilateral;   TEMPORARY PACEMAKER N/A 12/05/2019   Procedure: TEMPORARY PACEMAKER;  Surgeon: Duke Salvia, MD;  Location: [facility] INVASIVE CV LAB;  Service: Cardiovascular;  Laterality: N/A;    Social History Social History   Tobacco Use   Smoking status: Former    Current packs/day: 0.00    Average packs/day: 1.5 packs/day for 42.0 years (63.0 ttl pk-yrs)    Types: Cigarettes    Start date: 11/10/1968    Quit date: 11/11/2010    Years since quitting: 12.0   Smokeless tobacco: Never  Vaping Use   Vaping status: Never Used  Substance Use Topics   Alcohol use: Yes    Alcohol/week: 1.0 - 2.0 standard drink of alcohol    Types: 1 - 2 Glasses of wine per week    Comment: weekly   Drug use: No    Family History Family History  Problem Relation Age of Onset   Stroke Mother    Heart disease Mother 17       MI and CABG   Dementia Mother    Coronary artery disease Father    Parkinsonism Father    Breast cancer Sister    Cancer - Cervical Daughter 66       died 2022/03/28   Heart attack Brother 41   Colon cancer Neg Hx      Allergies  Allergen Reactions   Codeine Nausea And Vomiting   Morphine And Codeine Nausea Only     REVIEW OF SYSTEMS (Negative unless checked)  Constitutional: [] Weight loss  [] Fever  [] Chills Cardiac: [] Chest pain   [] Chest pressure   [] Palpitations   [] Shortness of breath when laying flat   [] Shortness of breath with exertion. Vascular:  [x] Pain in legs with walking   [] Pain in legs at rest  [] History of DVT   [] Phlebitis   [] Swelling in legs   [] Varicose veins   [] Non-healing ulcers Pulmonary:   [] Uses home oxygen   [] Productive cough   [] Hemoptysis   [] Wheeze  [] COPD   [] Asthma Neurologic:  []   Dizziness   [] Seizures   [] History of stroke   [] History of TIA  [] Aphasia   [] Vissual changes   [] Weakness or numbness in arm   [] Weakness or numbness in leg Musculoskeletal:   [] Joint swelling   [] Joint pain   [] Low back pain Hematologic:  [] Easy bruising  [] Easy bleeding   [] Hypercoagulable state   [] Anemic Gastrointestinal:  [] Diarrhea   [] Vomiting  [] Gastroesophageal reflux/heartburn   [] Difficulty swallowing. Genitourinary:  [] Chronic kidney disease   [] Difficult urination  [] Frequent urination   [] Blood in urine Skin:  [] Rashes   [] Ulcers  Psychological:  [] History of anxiety   []  History of major depression.  Physical Examination  There were no vitals filed for this visit. There is no height or weight on file to calculate BMI. Gen: WD/WN, NAD Head: [place]/AT, No temporalis wasting.  Ear/Nose/Throat: Hearing grossly intact, nares w/o erythema or drainage Eyes: PER, EOMI, sclera nonicteric.  Neck: Supple, no masses.  No bruit or JVD.  Pulmonary:  Good air movement, no audible wheezing, no use of accessory muscles.  Cardiac: RRR, normal S1, S2, no Murmurs. Vascular:  carotid bruit noted Vessel Right Left  Radial Palpable Palpable  Carotid  Palpable  Palpable  Gastrointestinal: soft, non-distended. No guarding/no peritoneal signs.  Musculoskeletal: M/S 5/5 throughout.  No visible  deformity.  Neurologic: CN 2-12 intact. Pain and light touch intact in extremities.  Symmetrical.  Speech is fluent. Motor exam as listed above. Psychiatric: Judgment intact, Mood & affect appropriate for pt's clinical situation. Dermatologic: No rashes or ulcers noted.  No changes consistent with cellulitis.   CBC Lab Results  Component Value Date   WBC 6.6 09/18/2022   HGB 12.3 09/18/2022   HCT 38.0 09/18/2022   MCV 90.2 09/18/2022   PLT 323.0 09/18/2022    BMET    Component Value Date/Time   NA 135 09/18/2022 0935   NA 139 11/16/2019 1433   NA 136 07/21/2013 1517   K 4.3 09/18/2022 0935   K 4.2 07/21/2013 1517   CL 102 09/18/2022 0935   CL 105 07/21/2013 1517   CO2 22 09/18/2022 0935   CO2 22 07/21/2013 1517   GLUCOSE 89 09/18/2022 0935   GLUCOSE 131 (H) 07/21/2013 1517   BUN 22 09/18/2022 0935   BUN 28 (H) 11/16/2019 1433   BUN 19 (H) 07/21/2013 1517   CREATININE 1.10 (H) 11/17/2022 1404   CREATININE 0.94 07/21/2013 1517   CALCIUM 9.6 09/18/2022 0935   CALCIUM 9.4 07/21/2013 1517   GFRNONAA >60 04/30/2022 0404   GFRNONAA >60 07/21/2013 1517   GFRAA 60 11/16/2019 1433   GFRAA >60 07/21/2013 1517   Estimated Creatinine Clearance: 52.6 mL/min (A) (by C-G formula based on SCr of 1.1 mg/dL (H)).  COAG Lab Results  Component Value Date   INR 1.5 (H) 09/05/2021   INR 1.4 (H) 09/04/2021   INR 1.0 04/11/2021    Radiology CUP PACEART INCLINIC DEVICE CHECK  Result Date: 11/25/2022 Pacemaker check in clinic. Normal device function. Thresholds, sensing, impedances consistent with previous measurements. Device programmed to maximize longevity. Brief AMS-Atach.  No high ventricular rates noted. Device programmed at appropriate safety margins. Histogram distribution appropriate for patient activity level. Device programmed to optimize intrinsic conduction. Estimated longevity 11.5 years. Patient enrolled in remote follow-up. Patient education completed.Ancil Boozer, BSN, RN     Assessment/Plan 1. Symptomatic stenosis of both carotid arteries without infarction Recommend:  Given the patient's asymptomatic subcritical stenosis no further invasive testing or surgery at this time.  Duplex ultrasound of the carotid arteries obtained today demonstrates 1 to 39% stenosis bilaterally with antegrade vertebral artery flow.  No significant change compared to the previous study  Continue antiplatelet therapy, however we will stop the Plavix and start aspirin 81 mg p.o. daily.  She will continue Eliquis.  Continue management of CAD, HTN and Hyperlipidemia Healthy heart diet,  encouraged exercise at least 4 times per week Follow up in 6 months with duplex ultrasound and physical exam  - VAS US CAROTID; Future  2. Coronary artery disease involving native coronary artery of native heart without angina pectoris Continue cardiac and antihypertensive medications as already ordered and reviewed, no changes at this time.  Continue statin as ordered and reviewed, no changes at this time  Nitrates PRN for chest pain  3. Paroxysmal atrial fibrillation (HCC) Continue antiarrhythmia medications as already ordered, these medications have been reviewed and there are no changes at this time.  Continue anticoagulation as ordered by Cardiology Service  4. Mild asthma without complication, unspecified whether persistent Continue pulmonary medications and aerosols as already ordered, these medications have been reviewed and there are no changes at this time.   5. Pure hypercholesterolemia Continue statin as ordered and reviewed, no changes at this time    Levora Dredge, MD  11/30/2022 3:23 PM

## 2022-12-01 ENCOUNTER — Encounter (INDEPENDENT_AMBULATORY_CARE_PROVIDER_SITE_OTHER): Payer: Self-pay | Admitting: Vascular Surgery

## 2022-12-01 ENCOUNTER — Other Ambulatory Visit: Payer: Self-pay | Admitting: Internal Medicine

## 2022-12-01 ENCOUNTER — Ambulatory Visit (INDEPENDENT_AMBULATORY_CARE_PROVIDER_SITE_OTHER): Payer: Medicare Other

## 2022-12-01 ENCOUNTER — Ambulatory Visit (INDEPENDENT_AMBULATORY_CARE_PROVIDER_SITE_OTHER): Payer: Medicare Other | Admitting: Vascular Surgery

## 2022-12-01 VITALS — BP 94/57 | HR 73 | Resp 18 | Ht 68.5 in | Wt 202.2 lb

## 2022-12-01 DIAGNOSIS — I480 Paroxysmal atrial fibrillation: Secondary | ICD-10-CM | POA: Diagnosis not present

## 2022-12-01 DIAGNOSIS — I2510 Atherosclerotic heart disease of native coronary artery without angina pectoris: Secondary | ICD-10-CM

## 2022-12-01 DIAGNOSIS — J45909 Unspecified asthma, uncomplicated: Secondary | ICD-10-CM | POA: Diagnosis not present

## 2022-12-01 DIAGNOSIS — I6523 Occlusion and stenosis of bilateral carotid arteries: Secondary | ICD-10-CM

## 2022-12-01 DIAGNOSIS — E7800 Pure hypercholesterolemia, unspecified: Secondary | ICD-10-CM

## 2022-12-02 NOTE — Telephone Encounter (Signed)
Refill request

## 2022-12-02 NOTE — Telephone Encounter (Signed)
Prescription refill request for Eliquis received. Indication:afib Last office visit:10/24 Scr:1.10  9/24 Age: 72 Weight:91.7  kg  Prescription refilled

## 2022-12-04 ENCOUNTER — Ambulatory Visit (INDEPENDENT_AMBULATORY_CARE_PROVIDER_SITE_OTHER): Payer: Medicare Other

## 2022-12-04 DIAGNOSIS — I442 Atrioventricular block, complete: Secondary | ICD-10-CM

## 2022-12-04 LAB — CUP PACEART REMOTE DEVICE CHECK
Battery Remaining Longevity: 126 mo
Battery Remaining Percentage: 100 %
Brady Statistic RA Percent Paced: 2 %
Brady Statistic RV Percent Paced: 100 %
Date Time Interrogation Session: 20241010011100
Implantable Lead Connection Status: 753985
Implantable Lead Connection Status: 753985
Implantable Lead Implant Date: 20130830
Implantable Lead Implant Date: 20130830
Implantable Lead Location: 753859
Implantable Lead Location: 753860
Implantable Lead Model: 4456
Implantable Lead Model: 4479
Implantable Lead Serial Number: 473325
Implantable Lead Serial Number: 523784
Implantable Pulse Generator Implant Date: 20211011
Lead Channel Impedance Value: 402 Ohm
Lead Channel Impedance Value: 587 Ohm
Lead Channel Pacing Threshold Amplitude: 0.5 V
Lead Channel Pacing Threshold Pulse Width: 0.4 ms
Lead Channel Setting Pacing Amplitude: 2 V
Lead Channel Setting Pacing Amplitude: 2 V
Lead Channel Setting Pacing Pulse Width: 0.4 ms
Lead Channel Setting Sensing Sensitivity: 2.5 mV
Pulse Gen Serial Number: 949303
Zone Setting Status: 755011

## 2022-12-05 ENCOUNTER — Encounter (INDEPENDENT_AMBULATORY_CARE_PROVIDER_SITE_OTHER): Payer: Self-pay | Admitting: Vascular Surgery

## 2022-12-05 MED ORDER — ASPIRIN 81 MG PO TBEC: 81.0000 mg | DELAYED_RELEASE_TABLET | Freq: Every day | ORAL | 2 refills | Status: AC

## 2022-12-11 ENCOUNTER — Other Ambulatory Visit: Payer: Self-pay | Admitting: General Surgery

## 2022-12-11 DIAGNOSIS — Z1231 Encounter for screening mammogram for malignant neoplasm of breast: Secondary | ICD-10-CM

## 2022-12-16 ENCOUNTER — Other Ambulatory Visit: Payer: Self-pay | Admitting: Family Medicine

## 2022-12-16 NOTE — Telephone Encounter (Signed)
Name of Medication: Ambien Name of Pharmacy: [facility] [address] Last Fill or Written Date and Quantity: 06/20/22 #30 tabs/ 3 refills Last Office Visit and Type: f/u was on 09/18/22 Next Office Visit and Type: none scheduled

## 2022-12-17 NOTE — Progress Notes (Signed)
Remote pacemaker transmission.   

## 2022-12-24 ENCOUNTER — Ambulatory Visit
Admission: RE | Admit: 2022-12-24 | Discharge: 2022-12-24 | Disposition: A | Discharge status: Home / Self Care | Payer: Medicare Other | Source: Ambulatory Visit | Attending: Radiation Oncology | Admitting: Radiation Oncology

## 2022-12-24 ENCOUNTER — Encounter: Payer: Self-pay | Admitting: Radiation Oncology

## 2022-12-24 VITALS — BP 99/52 | HR 79 | Temp 97.9°F | Resp 20 | Wt 201.0 lb

## 2022-12-24 DIAGNOSIS — R918 Other nonspecific abnormal finding of lung field: Secondary | ICD-10-CM | POA: Diagnosis present

## 2022-12-24 DIAGNOSIS — Z923 Personal history of irradiation: Secondary | ICD-10-CM | POA: Insufficient documentation

## 2022-12-24 DIAGNOSIS — R911 Solitary pulmonary nodule: Secondary | ICD-10-CM

## 2022-12-24 DIAGNOSIS — Z853 Personal history of malignant neoplasm of breast: Secondary | ICD-10-CM | POA: Insufficient documentation

## 2022-12-24 NOTE — Progress Notes (Signed)
Radiation Oncology Follow up Note  Name: Diana Cook   Date:   12/24/2022 MRN:  [id number] DOB: [birth year]    This 72 y.o. female presents to the clinic today for 53-month follow-up status post SBRT treatment for stage I non-small cell lung cancer of the right lung previously treated to her right breast over 5 years prior.  REFERRING PROVIDER: Judy Pimple, MD  HPI: Patient is a 72 year old female now out 10 months having completed SBRT for a probable stage I non-small cell lung cancer of the right lung.  Seen today in routine follow-up she is doing well.  She specifically denies any dysphagia cough or bone pain.  She had a CT scan back in September showing evolutionary changes radiation therapy the right upper lobe.  Spiculated left lower lobe nodule stable from April 2024 and is well back to November 2022. COMPLICATIONS OF TREATMENT: none  FOLLOW UP COMPLIANCE: keeps appointments   PHYSICAL EXAM:  BP (!) 99/52 Comment: normally low  Pulse 79   Temp 97.9 F (36.6 C) (Tympanic)   Resp 20   Wt 201 lb (91.2 kg)   BMI 30.12 kg/m  Well-developed well-nourished patient in NAD. HEENT reveals PERLA, EOMI, discs not visualized.  Oral cavity is clear. No oral mucosal lesions are identified. Neck is clear without evidence of cervical or supraclavicular adenopathy. Lungs are clear to A&P. Cardiac examination is essentially unremarkable with regular rate and rhythm without murmur rub or thrill. Abdomen is benign with no organomegaly or masses noted. Motor sensory and DTR levels are equal and symmetric in the upper and lower extremities. Cranial nerves II through XII are grossly intact. Proprioception is intact. No peripheral adenopathy or edema is identified. No motor or sensory levels are noted. Crude visual fields are within normal range.  RADIOLOGY RESULTS: CT scans reviewed compatible with above-stated findings  PLAN: Present time patient is doing well very low side effect profile from SBRT  with repeat excellent results by CT criteria.  I have asked to see her back in 6 months for follow-up.  Patient knows to call with any concerns.  I would like to take this opportunity to thank you for allowing me to participate in the care of your patient.Carmina Miller, MD

## 2022-12-29 ENCOUNTER — Encounter: Payer: Self-pay | Admitting: Pulmonary Disease

## 2022-12-29 NOTE — Telephone Encounter (Signed)
Yes she should get the RSV vaccine.

## 2023-01-06 ENCOUNTER — Ambulatory Visit
Admission: RE | Admit: 2023-01-06 | Discharge: 2023-01-06 | Disposition: A | Discharge status: Home / Self Care | Payer: Medicare Other | Source: Ambulatory Visit | Attending: General Surgery | Admitting: General Surgery

## 2023-01-06 DIAGNOSIS — Z1231 Encounter for screening mammogram for malignant neoplasm of breast: Secondary | ICD-10-CM | POA: Diagnosis present

## 2023-02-09 ENCOUNTER — Encounter: Payer: Self-pay | Admitting: Internal Medicine

## 2023-02-09 ENCOUNTER — Ambulatory Visit (INDEPENDENT_AMBULATORY_CARE_PROVIDER_SITE_OTHER): Payer: Medicare Other | Admitting: Internal Medicine

## 2023-02-09 VITALS — BP 102/66 | HR 68 | Temp 97.9°F | Ht 68.5 in | Wt 204.0 lb

## 2023-02-09 DIAGNOSIS — R3915 Urgency of urination: Secondary | ICD-10-CM | POA: Insufficient documentation

## 2023-02-09 LAB — POC URINALSYSI DIPSTICK (AUTOMATED)
Bilirubin, UA: NEGATIVE
Glucose, UA: NEGATIVE
Nitrite, UA: NEGATIVE
Protein, UA: POSITIVE — AB
Spec Grav, UA: 1.02 (ref 1.010–1.025)
Urobilinogen, UA: 0.2 U/dL
pH, UA: 5.5 (ref 5.0–8.0)

## 2023-02-09 MED ORDER — SULFAMETHOXAZOLE-TRIMETHOPRIM 800-160 MG PO TABS: 1.0000 | ORAL_TABLET | Freq: Two times a day (BID) | ORAL | 0 refills | Status: DC

## 2023-02-09 NOTE — Assessment & Plan Note (Signed)
And then dysuria Also worsened incontinence Has fatigue but no fever Urinalysis shows 3+ leuks, but negative nitrite 2+ blood  Will give septra DS 1 bid ---plan 1 week since some systemic symptoms Will send culture

## 2023-02-09 NOTE — Progress Notes (Signed)
Subjective:    Patient ID: Diana Cook, female    DOB: November 10, 1950, 72 y.o.   MRN: [id number]  HPI Here due to urinary symptoms With husband  Started last week---had sense of urgency and then incontinence Got thicker pads Then noted burning dysuria Energy levels went down No fever No blood  No meds for this  Current Outpatient Medications on File Prior to Visit  Medication Sig Dispense Refill   acetaminophen (TYLENOL) 500 MG tablet Take 500-1,000 mg by mouth every 6 (six) hours as needed for mild pain or headache.      albuterol (VENTOLIN HFA) 108 (90 Base) MCG/ACT inhaler Inhale 2 puffs into the lungs every 4 (four) hours as needed for wheezing or shortness of breath. 18 g 1   apixaban (ELIQUIS) 5 MG TABS tablet Take 1 tablet by mouth twice daily 180 tablet 1   aspirin EC 81 MG tablet Take 1 tablet (81 mg total) by mouth daily. 150 tablet 2   atorvastatin (LIPITOR) 40 MG tablet Take 1 tablet by mouth once daily 90 tablet 1   cephALEXin (KEFLEX) 500 MG capsule Take 1 capsule (500 mg total) by mouth 2 (two) times daily. 14 capsule 0   Cholecalciferol (VITAMIN D3) 50 MCG (2000 UT) capsule Take 2,000 Units by mouth daily.     FLUoxetine (PROZAC) 20 MG capsule Take 1 capsule by mouth once daily 90 capsule 2   furosemide (LASIX) 40 MG tablet Take 1 tablet (40 mg total) by mouth daily as needed (swelling, wt gain or needing more oxygen). 90 tablet 0   gabapentin (NEURONTIN) 100 MG capsule Take 100 mg by mouth as needed.     levETIRAcetam (KEPPRA) 250 MG tablet Take 250 mg by mouth 2 (two) times daily.      OXYGEN Inhale 4 L into the lungs continuous.     pantoprazole (PROTONIX) 40 MG tablet Take 1 tablet (40 mg total) by mouth daily. 90 tablet 3   STIOLTO RESPIMAT 2.5-2.5 MCG/ACT AERS INHALE 2 PUFFS BY MOUTH ONCE DAILY 12 g 0   zolpidem (AMBIEN) 10 MG tablet TAKE 1/2 TO 1 (ONE-HALF TO ONE) TABLET BY MOUTH AT BEDTIME AS NEEDED FOR SLEEP 30 tablet 3   No current facility-administered  medications on file prior to visit.    Allergies  Allergen Reactions   Codeine Nausea And Vomiting   Morphine And Codeine Nausea Only    Past Medical History:  Diagnosis Date   Allergic rhinitis    Arthritis    Asthma    as a child, mild now   Breast cancer (HCC) 12/2015   right breast cancer, lumpectomy and mammosite    Colon polyps    colonoscopy 7/08, tubular adenoma   Complete heart block (HCC) 09/2011   s/p PPM implanted in [facility]   COPD (chronic obstructive pulmonary disease) (HCC)    Myocardial infarction (HCC) 2011   Osteopenia 10/2015   Pacemaker    2011   Paroxysmal atrial fibrillation (HCC) 03/2021   Incidentally detected on pacemaker interrogation   Personal history of radiation therapy 2017   right breast ca, mammosite placed   Rotator cuff tear, right 02/2022   Had an MRI in [place] "I have 2 tears."   Seizure disorder (HCC)    Seizures (HCC)    first one was when she was 72 years old    Small bowel obstruction (HCC)    1988 and 2002   Tobacco abuse     Past  Surgical History:  Procedure Laterality Date   ABDOMINAL HYSTERECTOMY     BREAST BIOPSY Right 2007   benign inflammatory changes, mass due to underwire bra   BREAST BIOPSY Left 01/02/2016   columnar cell changes without atypical hyperplasia.   BREAST BIOPSY Right 12/06/2015   rt breast mass 10:00, bx done at Dr. Arvella Nigh office, invasive ductal carcinoma   BREAST EXCISIONAL BIOPSY Left 01/02/2016   COLUMNAR CELL CHANGE AND HYPERPLASIA ASSOCIATED WITH LUMINAL AND STROMAL CALCIFICATIONS   BREAST LUMPECTOMY Right 01/02/2016   invasive mammary carcinoma, clear margins, negative LN   BREAST LUMPECTOMY WITH SENTINEL LYMPH NODE BIOPSY Right 01/02/2016   pT1c, N0; ER/ PR 100%; Her 2 neu not over expressed: BREAST LUMPECTOMY WITH SENTINEL LYMPH NODE BX;  Surgeon: Earline Mayotte, MD;  Location: [facility] ORS;  Service: General;  Laterality: Right;   CARDIAC CATHETERIZATION     CAROTID PTA/STENT  INTERVENTION Right 04/29/2022   Procedure: CAROTID PTA/STENT INTERVENTION;  Surgeon: Renford Dills, MD;  Location: [facility] INVASIVE CV LAB;  Service: Cardiovascular;  Laterality: Right;   CATARACT EXTRACTION Bilateral    COLONOSCOPY  10/2015   Dr Christella Hartigan   COLONOSCOPY WITH PROPOFOL N/A 08/19/2018   Procedure: COLONOSCOPY WITH PROPOFOL;  Surgeon: Pasty Spillers, MD;  Location: [facility] ENDOSCOPY;  Service: Endoscopy;  Laterality: N/A;   ESOPHAGOGASTRODUODENOSCOPY (EGD) WITH PROPOFOL N/A 08/19/2018   Procedure: ESOPHAGOGASTRODUODENOSCOPY (EGD) WITH PROPOFOL;  Surgeon: Pasty Spillers, MD;  Location: [facility] ENDOSCOPY;  Service: Endoscopy;  Laterality: N/A;   EXPLORATORY LAPAROTOMY  01/25/2001   Exploratory laparotomy, lysis of adhesions, identification of internal hernia secondary to omental adhesion. Prolonged postoperative ileus.   gyn surgery  1993   hysterectomy- form endometriosis   LAPAROSCOPY     PACEMAKER INSERTION  10/24/11   [employer] Advantio dual chamber PPM implanted by Dr Venda Rodes at [facility] in [place]   PPM GENERATOR CHANGEOUT N/A 12/05/2019   Procedure: PPM GENERATOR Janeann Merl;  Surgeon: Duke Salvia, MD;  Location: [facility] INVASIVE CV LAB;  Service: Cardiovascular;  Laterality: N/A;   RIGHT/LEFT HEART CATH AND CORONARY ANGIOGRAPHY Bilateral 07/19/2019   Procedure: RIGHT/LEFT HEART CATH AND CORONARY ANGIOGRAPHY;  Surgeon: Yvonne Kendall, MD;  Location: [facility] INVASIVE CV LAB;  Service: Cardiovascular;  Laterality: Bilateral;   TEMPORARY PACEMAKER N/A 12/05/2019   Procedure: TEMPORARY PACEMAKER;  Surgeon: Duke Salvia, MD;  Location: [facility] INVASIVE CV LAB;  Service: Cardiovascular;  Laterality: N/A;    Family History  Problem Relation Age of Onset   Stroke Mother    Heart disease Mother 72       MI and CABG   Dementia Mother    Coronary artery disease Father    Parkinsonism Father    Breast cancer Sister    Cancer - Cervical Daughter 63        died 2022/03/22   Heart attack Brother 50   Colon cancer Neg Hx     Social History   Socioeconomic History   Marital status: Married    Spouse name: Tom   Number of children: Not on file   Years of education: Not on file   Highest education level: Not on file  Occupational History    Employer: RETIRED  Tobacco Use   Smoking status: Former    Current packs/day: 0.00    Average packs/day: 1.5 packs/day for 42.0 years (63.0 ttl pk-yrs)    Types: Cigarettes    Start date: 11/10/1968    Quit date: 11/11/2010  Years since quitting: 12.2   Smokeless tobacco: Never  Vaping Use   Vaping status: Never Used  Substance and Sexual Activity   Alcohol use: Yes    Alcohol/week: 1.0 - 2.0 standard drink of alcohol    Types: 1 - 2 Glasses of wine per week    Comment: weekly   Drug use: No   Sexual activity: Never  Other Topics Concern   Not on file  Social History Narrative   Married 40 years.   Exercises, does walking tapes   Social Drivers of Health   Financial Resource Strain: Low Risk  (03/12/2022)   Overall Financial Resource Strain (CARDIA)    Difficulty of Paying Living Expenses: Not hard at all  Food Insecurity: No Food Insecurity (04/30/2022)   Hunger Vital Sign    Worried About Running Out of Food in the Last Year: Never true    Ran Out of Food in the Last Year: Never true  Transportation Needs: No Transportation Needs (04/30/2022)   PRAPARE - Administrator, Civil Service (Medical): No    Lack of Transportation (Non-Medical): No  Physical Activity: Insufficiently Active (03/12/2022)   Exercise Vital Sign    Days of Exercise per Week: 2 days    Minutes of Exercise per Session: 50 min  Stress: No Stress Concern Present (03/12/2022)   [employer] of Occupational Health - Occupational Stress Questionnaire    Feeling of Stress : Not at all  Social Connections: Socially Integrated (03/12/2022)   Social Connection and Isolation Panel [NHANES]    Frequency of  Communication with Friends and Family: More than three times a week    Frequency of Social Gatherings with Friends and Family: More than three times a week    Attends Religious Services: More than 4 times per year    Active Member of [employer] or Organizations: Yes    Attends Banker Meetings: 1 to 4 times per year    Marital Status: Married  Catering manager Violence: Not At Risk (04/30/2022)   Humiliation, Afraid, Rape, and Kick questionnaire    Fear of Current or Ex-Partner: No    Emotionally Abused: No    Physically Abused: No    Sexually Abused: No   Review of Systems Some chronic back pain---nothing new No N/V    Objective:   Physical Exam Constitutional:      Appearance: Normal appearance.  Abdominal:     Palpations: Abdomen is soft.     Tenderness: There is no abdominal tenderness. There is no right CVA tenderness or left CVA tenderness.  Neurological:     Mental Status: She is alert.            Assessment & Plan:

## 2023-02-11 LAB — URINE CULTURE
MICRO NUMBER:: 15855023
SPECIMEN QUALITY:: ADEQUATE

## 2023-02-13 ENCOUNTER — Other Ambulatory Visit: Payer: Self-pay | Admitting: Internal Medicine

## 2023-02-23 ENCOUNTER — Ambulatory Visit: Payer: Self-pay | Admitting: Family Medicine

## 2023-02-23 ENCOUNTER — Other Ambulatory Visit (HOSPITAL_COMMUNITY): Payer: Self-pay | Admitting: Family Medicine

## 2023-02-23 DIAGNOSIS — M5416 Radiculopathy, lumbar region: Secondary | ICD-10-CM

## 2023-02-23 NOTE — Telephone Encounter (Signed)
Left VM requesting pt to call the office back but she is seeing provider tomorrow

## 2023-02-23 NOTE — Telephone Encounter (Signed)
Encourage pt to stay hydrated the best she can and follow up as planned  Thanks

## 2023-02-23 NOTE — Telephone Encounter (Signed)
  Chief Complaint: low BP Symptoms: tired Frequency: since UTI 12/16  Disposition: [] ED /[] Urgent Care (no appt availability in office) / [x] Appointment(In office/virtual)/ []  [facility]/ [] Home Care/ [] Refused Recommended Disposition /[] [place] [facility]/ []  Follow-up with PCP Additional Notes: Pt at spine specialist appt. Spoke with [place], CMA.  [place] stated the pt had initial reading of 88/56. Pt was given fluid and hooked up to 4L of oxygen. 15 mins later BP was 110/70. Pt was asked if she had any other sypmtoms and [place] reported back, "just fatigue." Pt schedule for 12/31 appt with Dr Ermalene Searing. Pt denies history of hypotension. RN gave CMA advice to recheck bp before pt left and if low pt needs to be taken to hospital and to not drive. Gave all care advice through [place], CMA who stated she would tell pt and also talk with office NP.           Copied from CRM [id number]. Topic: Clinical - Red Word Triage >> Feb 23, 2023  2:19 PM Corin V wrote: [facility] that prompted transfer to Nurse Triage: [place] with [facility] calling as Pt is in office and blood pressure is 88/56. They requested to speak to Dr. Royden Purl nurse to see what Dr. Milinda Antis wanted to do. Reason for Disposition  [1] Systolic BP 90-110 AND [2] taking blood pressure medications AND [3] NOT dizzy, lightheaded or weak  Answer Assessment - Initial Assessment Questions 1. BLOOD PRESSURE: "What is the blood pressure?" "Did you take at least two measurements 5 minutes apart?"     1. 88/56 and 15 mins later 110/70 2. ONSET: "When did you take your blood pressure?"     15 mins ago 3. HOW: "How did you obtain the blood pressure?" (e.g., visiting nurse, automatic home BP monitor)     Automatic and manual  4. HISTORY: "Do you have a history of low blood pressure?" "What is your blood pressure normally?"     no 5. MEDICINES: "Are you taking any medications for blood pressure?" If Yes, ask: "Have they been  changed recently?"     no 6. PULSE RATE: "Do you know what your pulse rate is?"      74 7. OTHER SYMPTOMS: "Have you been sick recently?" "Have you had a recent injury?"     UTI, 12/16  Protocols used: Blood Pressure - Low-A-AH

## 2023-02-24 ENCOUNTER — Ambulatory Visit (INDEPENDENT_AMBULATORY_CARE_PROVIDER_SITE_OTHER): Payer: Medicare Other | Admitting: Family Medicine

## 2023-02-24 VITALS — BP 100/60 | HR 67 | Temp 98.4°F | Ht 67.0 in | Wt 204.0 lb

## 2023-02-24 DIAGNOSIS — R5383 Other fatigue: Secondary | ICD-10-CM

## 2023-02-24 DIAGNOSIS — R0789 Other chest pain: Secondary | ICD-10-CM | POA: Diagnosis not present

## 2023-02-24 DIAGNOSIS — I959 Hypotension, unspecified: Secondary | ICD-10-CM

## 2023-02-24 DIAGNOSIS — R0902 Hypoxemia: Secondary | ICD-10-CM | POA: Diagnosis not present

## 2023-02-24 LAB — CBC WITH DIFFERENTIAL/PLATELET
Basophils Absolute: 0.1 10*3/uL (ref 0.0–0.1)
Basophils Relative: 1.2 % (ref 0.0–3.0)
Eosinophils Absolute: 0.3 10*3/uL (ref 0.0–0.7)
Eosinophils Relative: 3.4 % (ref 0.0–5.0)
HCT: 39.3 % (ref 36.0–46.0)
Hemoglobin: 12.9 g/dL (ref 12.0–15.0)
Lymphocytes Relative: 11.5 % — ABNORMAL LOW (ref 12.0–46.0)
Lymphs Abs: 0.9 10*3/uL (ref 0.7–4.0)
MCHC: 32.8 g/dL (ref 30.0–36.0)
MCV: 91.2 fL (ref 78.0–100.0)
Monocytes Absolute: 0.6 10*3/uL (ref 0.1–1.0)
Monocytes Relative: 8.3 % (ref 3.0–12.0)
Neutro Abs: 5.8 10*3/uL (ref 1.4–7.7)
Neutrophils Relative %: 75.6 % (ref 43.0–77.0)
Platelets: 292 10*3/uL (ref 150.0–400.0)
RBC: 4.31 Mil/uL (ref 3.87–5.11)
RDW: 14.3 % (ref 11.5–15.5)
WBC: 7.7 10*3/uL (ref 4.0–10.5)

## 2023-02-24 LAB — POC URINALSYSI DIPSTICK (AUTOMATED)
Bilirubin, UA: NEGATIVE
Blood, UA: NEGATIVE
Glucose, UA: NEGATIVE
Leukocytes, UA: NEGATIVE
Nitrite, UA: NEGATIVE
Protein, UA: POSITIVE — AB
Spec Grav, UA: 1.02 (ref 1.010–1.025)
Urobilinogen, UA: 0.2 U/dL
pH, UA: 5.5 (ref 5.0–8.0)

## 2023-02-24 LAB — COMPREHENSIVE METABOLIC PANEL
ALT: 19 U/L (ref 0–35)
AST: 22 U/L (ref 0–37)
Albumin: 4.5 g/dL (ref 3.5–5.2)
Alkaline Phosphatase: 94 U/L (ref 39–117)
BUN: 22 mg/dL (ref 6–23)
CO2: 25 meq/L (ref 19–32)
Calcium: 9.6 mg/dL (ref 8.4–10.5)
Chloride: 101 meq/L (ref 96–112)
Creatinine, Ser: 1.08 mg/dL (ref 0.40–1.20)
GFR: 50.58 mL/min — ABNORMAL LOW (ref >60.00)
Glucose, Bld: 87 mg/dL (ref 70–99)
Potassium: 4.5 meq/L (ref 3.5–5.1)
Sodium: 137 meq/L (ref 135–145)
Total Bilirubin: 0.8 mg/dL (ref 0.2–1.2)
Total Protein: 7.5 g/dL (ref 6.0–8.3)

## 2023-02-24 NOTE — Patient Instructions (Signed)
Please stop at the lab to have labs drawn.  

## 2023-02-24 NOTE — Progress Notes (Signed)
 Patient ID: Diana Cook, female    DOB: Jan 06, 1951, 72 y.o.   MRN: [id number]  This visit was conducted in person.  BP 100/60 (BP Location: Left Arm, Patient Position: Sitting, Cuff Size: Large)   Pulse 67   Temp 98.4 F (36.9 C) (Temporal)   Ht 5' 7 (1.702 m)   Wt 204 lb (92.5 kg)   SpO2 94% Comment: 4 L O2  BMI 31.95 kg/m    CC:  Chief Complaint  Patient presents with   Low Blood Pressure    Seen at [facility] yesterday and her BP and O2 was low    Subjective:   HPI: Diana Cook is a 72 y.o. female patient of Dr. Randeen presenting on 02/24/2023 for Low Blood Pressure (Seen at [facility] yesterday and her BP and O2 was low)  Has history of asthma, COPD, HFpEF, paroxsysmal atrial fibrillation She has as history of orthostatic hypotension Has pacemaker.  History of breat cancer 7 years ago.   Seen at office visit with physical medicine at [facility] for lumbar stenosis. blood pressure found to be 88/56, heart rate 74 and oxygen  saturation 86%. Of note she had left her oxygen  in the car for a while ( usually on 4 L)  She has been drinking lots of water since yesterday.. usually does not and yesterbday only had 1 cupp coffee and tea before MD appt.   She had noted feeling more tired overall in last  couple of weeks.  Has noted intermittent off balance feeling.. normally has this.  Slight worsening SOB but has this at baseline.  Has productive sputum, but no color change.  No vaginal bleeding. Today she has felt chest pressure oin left chest to arm, mild, at rest.   Recent UTI [facility]... cloudiness, and burning has resolved. Treated with Bactrim .   No recent new meds.  Using tylenol  instead of gabapentin  for pain   In office today oxygen  level 94% and blood pressure improved. BP Readings from Last 3 Encounters:  02/24/23 100/60  02/09/23 102/66  12/24/22 (!) 99/52      Relevant past medical, surgical, family and social history reviewed and updated as indicated.  Interim medical history since our last visit reviewed. Allergies and medications reviewed and updated. Outpatient Medications Prior to Visit  Medication Sig Dispense Refill   acetaminophen  (TYLENOL ) 500 MG tablet Take 500-1,000 mg by mouth every 6 (six) hours as needed for mild pain or headache.      albuterol  (VENTOLIN  HFA) 108 (90 Base) MCG/ACT inhaler Inhale 2 puffs into the lungs every 4 (four) hours as needed for wheezing or shortness of breath. 18 g 1   apixaban  (ELIQUIS ) 5 MG TABS tablet Take 1 tablet by mouth twice daily 180 tablet 1   aspirin  EC 81 MG tablet Take 1 tablet (81 mg total) by mouth daily. 150 tablet 2   atorvastatin  (LIPITOR) 40 MG tablet Take 1 tablet by mouth once daily 90 tablet 2   Cholecalciferol  (VITAMIN D3) 50 MCG (2000 UT) capsule Take 2,000 Units by mouth daily.     FLUoxetine  (PROZAC ) 20 MG capsule Take 1 capsule by mouth once daily 90 capsule 2   furosemide  (LASIX ) 40 MG tablet Take 1 tablet (40 mg total) by mouth daily as needed (swelling, wt gain or needing more oxygen ). 90 tablet 0   levETIRAcetam  (KEPPRA ) 250 MG tablet Take 250 mg by mouth 2 (two) times daily.      OXYGEN  Inhale 4 L into the  lungs continuous.     pantoprazole  (PROTONIX ) 40 MG tablet Take 1 tablet (40 mg total) by mouth daily. 90 tablet 3   STIOLTO RESPIMAT  2.5-2.5 MCG/ACT AERS INHALE 2 PUFFS BY MOUTH ONCE DAILY 12 g 0   sulfamethoxazole -trimethoprim  (BACTRIM  DS) 800-160 MG tablet Take 1 tablet by mouth 2 (two) times daily. 14 tablet 0   zolpidem  (AMBIEN ) 10 MG tablet TAKE 1/2 TO 1 (ONE-HALF TO ONE) TABLET BY MOUTH AT BEDTIME AS NEEDED FOR SLEEP 30 tablet 3   cephALEXin  (KEFLEX ) 500 MG capsule Take 1 capsule (500 mg total) by mouth 2 (two) times daily. 14 capsule 0   gabapentin  (NEURONTIN ) 100 MG capsule Take 100 mg by mouth as needed.     No facility-administered medications prior to visit.     Per HPI unless specifically indicated in ROS section below Review of Systems  Constitutional:   Positive for fatigue. Negative for fever.  HENT:  Negative for congestion.   Eyes:  Negative for pain.  Respiratory:  Positive for cough and shortness of breath.   Cardiovascular:  Negative for chest pain, palpitations and leg swelling.  Gastrointestinal:  Negative for abdominal pain.  Genitourinary:  Negative for dysuria and vaginal bleeding.  Musculoskeletal:  Negative for back pain.  Neurological:  Negative for syncope, light-headedness and headaches.  Psychiatric/Behavioral:  Negative for dysphoric mood.    Objective:  BP 100/60 (BP Location: Left Arm, Patient Position: Sitting, Cuff Size: Large)   Pulse 67   Temp 98.4 F (36.9 C) (Temporal)   Ht 5' 7 (1.702 m)   Wt 204 lb (92.5 kg)   SpO2 94% Comment: 4 L O2  BMI 31.95 kg/m   Wt Readings from Last 3 Encounters:  02/24/23 204 lb (92.5 kg)  02/09/23 204 lb (92.5 kg)  12/24/22 201 lb (91.2 kg)      Physical Exam Constitutional:      General: She is not in acute distress.    Appearance: Normal appearance. She is well-developed. She is not ill-appearing or toxic-appearing.     Comments: On 4 L continuous oxygen   HENT:     Head: Normocephalic.     Right Ear: Hearing, tympanic membrane, ear canal and external ear normal. Tympanic membrane is not erythematous, retracted or bulging.     Left Ear: Hearing, tympanic membrane, ear canal and external ear normal. Tympanic membrane is not erythematous, retracted or bulging.     Nose: No mucosal edema or rhinorrhea.     Right Sinus: No maxillary sinus tenderness or frontal sinus tenderness.     Left Sinus: No maxillary sinus tenderness or frontal sinus tenderness.     Mouth/Throat:     Pharynx: Uvula midline.  Eyes:     General: Lids are normal. Lids are everted, no foreign bodies appreciated.     Conjunctiva/sclera: Conjunctivae normal.     Pupils: Pupils are equal, round, and reactive to light.  Neck:     Thyroid : No thyroid  mass or thyromegaly.     Vascular: No carotid bruit.      Trachea: Trachea normal.  Cardiovascular:     Rate and Rhythm: Normal rate and regular rhythm.     Pulses: Normal pulses.     Heart sounds: Normal heart sounds, S1 normal and S2 normal. No murmur heard.    No friction rub. No gallop.  Pulmonary:     Effort: Pulmonary effort is normal. No tachypnea or respiratory distress.     Breath sounds: Decreased breath sounds present. No  wheezing, rhonchi or rales.  Abdominal:     General: Bowel sounds are normal.     Palpations: Abdomen is soft.     Tenderness: There is no abdominal tenderness.  Musculoskeletal:     Cervical back: Normal range of motion and neck supple.  Skin:    General: Skin is warm and dry.     Findings: No rash.  Neurological:     Mental Status: She is alert.  Psychiatric:        Mood and Affect: Mood is not anxious or depressed.        Speech: Speech normal.        Behavior: Behavior normal. Behavior is cooperative.        Thought Content: Thought content normal.        Judgment: Judgment normal.       Results for orders placed or performed in visit on 02/24/23  POCT Urinalysis Dipstick (Automated)   Collection Time: 02/24/23  9:51 AM  Result Value Ref Range   Color, UA Yellow    Clarity, UA Clear    Glucose, UA Negative Negative   Bilirubin, UA Negative    Ketones, UA Trace    Spec Grav, UA 1.020 1.010 - 1.025   Blood, UA Negative    pH, UA 5.5 5.0 - 8.0   Protein, UA Positive (A) Negative   Urobilinogen, UA 0.2 0.2 or 1.0 E.U./dL   Nitrite, UA Negative    Leukocytes, UA Negative Negative    Assessment and Plan  This is a complicated 72 year old female with complicated past medical history including severe COPD on continuous oxygen , paroxysmal atrial fibrillation and HFpEF found to be hypoxic with low blood pressure yesterday had a routine visit for back pain at [facility]. Today her oxygen  saturation and blood pressure are back at normal range. Of note she had been off oxygen  entirely going into  [facility] yesterday.  Oxygen  level returned to normal when she replaced the 4 L.  She has been pushing fluids yesterday and had not had much liquid yesterday.  She has a history of hypotension. She feels well other than new chest pressure today and mild fatigue ongoing for 2 weeks. Urinalysis in office today shows recent UTI likely resolved.  Symptoms have resolved as well status post Bactrim . EKG is stable from last EKG done in November 25, 2022, sinus rhythm, evidence of QRS widening and old Q waves, pacemaker present.  Less concern for evolving MI.  No evidence on exam suggesting exacerbation of heart failure. Will evaluate for infection, blood loss, kidney and liver function with labs. No definite worsening in COPD but if symptoms progress consider chest x-ray. Return and ER precautions provided.  Other fatigue -     CBC with Differential/Platelet -     Comprehensive metabolic panel  Hypotension, unspecified hypotension type -     POCT Urinalysis Dipstick (Automated) -     CBC with Differential/Platelet -     Comprehensive metabolic panel  Hypoxia  Chest pressure -     EKG 12-Lead    No follow-ups on file.   Greig Ring, MD

## 2023-02-26 ENCOUNTER — Ambulatory Visit: Payer: Self-pay | Admitting: Family Medicine

## 2023-02-26 MED ORDER — DOXYCYCLINE HYCLATE 100 MG PO TABS: 100.0000 mg | ORAL_TABLET | Freq: Two times a day (BID) | ORAL | 0 refills | Status: DC

## 2023-02-26 NOTE — Telephone Encounter (Signed)
  Chief Complaint: cough Symptoms: cough, yellow sputum, mild SOB, runny nose Frequency: worsening since last Tuesday Pertinent Negatives: Patient denies fever, chest pain Disposition: [] ED /[] Urgent Care (no appt availability in office) / [x] Appointment(In office/virtual)/ []  [facility]/ [] Home Care/ [x] Refused Recommended Disposition /[] [facility]/ []  Follow-up with PCP Additional Notes: Patient reports she was seen in office on Tuesday and advised by Dr Greig to call back with worsening symptoms. Patient reports her cough has gotten much worse since Tuesday and she is now coughing up yellow sputum. Patient reports she does have a history of COPD so typically has SOB at baseline, but that it feels worse then normal. Per protocol, this RN advised in office appt. Pt refused and is requesting to be prescribed antibiotics since she was just seen in office Tuesday and reports the Doctor said they would just call antibiotics in for her if needed. This RN advised she would forward the request to the appropriate person and advised patient to call back if symptoms worsen before she hears from someone. Patient verbalized understanding.     Copied from CRM [id number]. Topic: Clinical - Red Word Triage >> Feb 26, 2023  8:19 AM Gerardine PARAS wrote: Red Word that prompted transfer to Nurse Triage: Patient called back in following up on visit Tuesday, was advised if symptoms did not improve to call back. Patient states symptoms have worsened ad she has copd and a chest cold which is causing her a very bad cough/ congestion and phlegm, patient is requesting antibiotics Reason for Disposition  SEVERE coughing spells (e.g., whooping sound after coughing, vomiting after coughing)  Answer Assessment - Initial Assessment Questions 1. ONSET: When did the cough begin?      Last Tuesday 2. SEVERITY: How bad is the cough today?      severe 3. SPUTUM: Describe the color of your sputum (none, dry  cough; clear, white, yellow, green)     Yellow  4. HEMOPTYSIS: Are you coughing up any blood? If so ask: How much? (flecks, streaks, tablespoons, etc.)     none 5. DIFFICULTY BREATHING: Are you having difficulty breathing? If Yes, ask: How bad is it? (e.g., mild, moderate, severe)    - MILD: No SOB at rest, mild SOB with walking, speaks normally in sentences, can lie down, no retractions, pulse < 100.    - MODERATE: SOB at rest, SOB with minimal exertion and prefers to sit, cannot lie down flat, speaks in phrases, mild retractions, audible wheezing, pulse 100-120.    - SEVERE: Very SOB at rest, speaks in single words, struggling to breathe, sitting hunched forward, retractions, pulse > 120      mild 6. FEVER: Do you have a fever? If Yes, ask: What is your temperature, how was it measured, and when did it start?     none 7. CARDIAC HISTORY: Do you have any history of heart disease? (e.g., heart attack, congestive heart failure)      none 8. LUNG HISTORY: Do you have any history of lung disease?  (e.g., pulmonary embolus, asthma, emphysema)     COPD 9. PE RISK FACTORS: Do you have a history of blood clots? (or: recent major surgery, recent prolonged travel, bedridden)     none 10. OTHER SYMPTOMS: Do you have any other symptoms? (e.g., runny nose, wheezing, chest pain)       Runny nose  Protocols used: Cough - Acute Productive-A-AH

## 2023-02-26 NOTE — Telephone Encounter (Signed)
 Pt notified of Dr. Royden Purl comments. She will get abx now

## 2023-02-26 NOTE — Addendum Note (Signed)
 Addended by: Roxy Manns A on: 02/26/2023 09:32 AM   Modules accepted: Orders

## 2023-02-26 NOTE — Telephone Encounter (Signed)
 I sent in some doxycycline for exacerbation of copd  There is a lot going around/ may also be viral  Please alert [place] if increased wheezing Please give ER precautions  Follow up in office if worse or not improving

## 2023-03-02 ENCOUNTER — Ambulatory Visit: Payer: Self-pay | Admitting: Family Medicine

## 2023-03-02 NOTE — Telephone Encounter (Signed)
 Noted.

## 2023-03-02 NOTE — Telephone Encounter (Signed)
 Copied from CRM [id number]. Topic: Clinical - Pink Word Triage >> Mar 02, 2023  8:27 AM Alfonso ORN wrote: Reason for Triage: Dr. Randeen  prescribed antibiotics for cough and patient still having a deep cough and a lot phelm , and provider told patient to callback if not feeling better to schedule an appointment for today, however no available appointments . Patient is not feeling any better and today is last day she have for the antibiotics, Patient call back number is [phone]   Chief Complaint: cough, on antibiotics Symptoms: productive coughs, body aches Frequency: constant Pertinent Negatives: Patient denies fever Disposition: [] ED /[] Urgent Care (no appt availability in office) / [x] Appointment(In office/virtual)/ []  [place] [facility]/ [] Home Care/ [] Refused Recommended Disposition /[] [place] [facility]/ [x]  Follow-up with PCP Additional Notes: Patient seen last week for cough and fatigue, was prescribed doxycycline  for possible COPD exacerbation. Pt was advised to call back if symptoms persisted or worsened. Pt reports deep productive cough. No avail appts today, appt scheduled for 01/07 at 11am with Dr. Greig. Patient wanting to know if there is something else she can take another antibiotic for cough until she is seen, care advice provided per protocol, and message routed to clinic high priority for review.    Reason for Disposition  [1] Known COPD or other severe lung disease (i.e., bronchiectasis, cystic fibrosis, lung surgery) AND [2] worsening symptoms (i.e., increased sputum purulence or amount, increased breathing difficulty  Answer Assessment - Initial Assessment Questions 1. ONSET: When did the cough begin?      Started 1 week  2. SEVERITY: How bad is the cough today?      Getting worse  3. SPUTUM: Describe the color of your sputum (none, dry cough; clear, white, yellow, green)     Yellow, not bright yellow  4. HEMOPTYSIS: Are you coughing up any blood? If so  ask: How much? (flecks, streaks, tablespoons, etc.)     No blood  5. DIFFICULTY BREATHING: Are you having difficulty breathing? If Yes, ask: How bad is it? (e.g., mild, moderate, severe)    - MILD: No SOB at rest, mild SOB with walking, speaks normally in sentences, can lie down, no retractions, pulse < 100.    - MODERATE: SOB at rest, SOB with minimal exertion and prefers to sit, cannot lie down flat, speaks in phrases, mild retractions, audible wheezing, pulse 100-120.    - SEVERE: Very SOB at rest, speaks in single words, struggling to breathe, sitting hunched forward, retractions, pulse > 120      Mild shortness of breath. On Home 4LNC, but has some shortness of breath when coughing  6. FEVER: Do you have a fever? If Yes, ask: What is your temperature, how was it measured, and when did it start?     No fever  7. CARDIAC HISTORY: Do you have any history of heart disease? (e.g., heart attack, congestive heart failure)      Heart Failure; pacemaker  8. LUNG HISTORY: Do you have any history of lung disease?  (e.g., pulmonary embolus, asthma, emphysema)     COPD  9. PE RISK FACTORS: Do you have a history of blood clots? (or: recent major surgery, recent prolonged travel, bedridden)     No history of PE  10. OTHER SYMPTOMS: Do you have any other symptoms? (e.g., runny nose, wheezing, chest pain)       Body aches,   11. PREGNANCY: Is there any chance you are pregnant? When was your last menstrual  period?       N/A  12. TRAVEL: Have you traveled out of the country in the last month? (e.g., travel history, exposures)       Unsure of recent exposure, no travel  Protocols used: Cough - Acute Productive-A-AH

## 2023-03-02 NOTE — Telephone Encounter (Signed)
 Copied from CRM [id number]. Topic: Appointments - Scheduling Inquiry for Clinic >> Mar 02, 2023  8:27 AM Diana Cook wrote: Reason for CRM:   [id number]: Spoke with patient, appt scheduled for 01/07. See previous CRM

## 2023-03-03 ENCOUNTER — Ambulatory Visit: Payer: Medicare Other | Admitting: Family Medicine

## 2023-03-03 ENCOUNTER — Ambulatory Visit (INDEPENDENT_AMBULATORY_CARE_PROVIDER_SITE_OTHER)
Admission: RE | Admit: 2023-03-03 | Discharge: 2023-03-03 | Disposition: A | Discharge status: Home / Self Care | Payer: Medicare Other | Source: Ambulatory Visit | Attending: Family Medicine | Admitting: Family Medicine

## 2023-03-03 VITALS — BP 92/60 | HR 71 | Temp 98.1°F | Ht 67.0 in | Wt 206.0 lb

## 2023-03-03 DIAGNOSIS — R0602 Shortness of breath: Secondary | ICD-10-CM | POA: Diagnosis not present

## 2023-03-03 DIAGNOSIS — R053 Chronic cough: Secondary | ICD-10-CM | POA: Diagnosis not present

## 2023-03-03 MED ORDER — BENZONATATE 200 MG PO CAPS: 200.0000 mg | ORAL_CAPSULE | Freq: Two times a day (BID) | ORAL | 0 refills | Status: AC | PRN

## 2023-03-03 MED ORDER — PREDNISONE 20 MG PO TABS: ORAL_TABLET | ORAL | 0 refills | Status: DC

## 2023-03-03 NOTE — Progress Notes (Signed)
 Patient ID: Diana Cook, female    DOB: 1950-03-06, 73 y.o.   MRN: [id number]  This visit was conducted in person.  BP 92/60 (BP Location: Left Arm, Patient Position: Sitting, Cuff Size: Large)   Pulse 71   Temp 98.1 F (36.7 C) (Temporal)   Ht 5' 7 (1.702 m)   Wt 206 lb (93.4 kg)   SpO2 94% Comment: 4 L O2-Nasal Canula  BMI 32.26 kg/m    CC:  Chief Complaint  Patient presents with   Cough    Took last  antibiotics this morning-seen by Dr. Avelina on 02/24/2023 Was told to call back if symptoms worsened    Subjective:   HPI: Diana Cook is a 73 y.o. female  patient of Dr. 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 Avelina on 02/24/2023/Was told to call back if symptoms worsened)  Recent OV with myself on 02/24/2023 for fatigue.  Had low BP and low o2 sat at pain clinic, was resolved at appt on 02/24/2023  UA was clear, EKG unchanged Cbc:  wbc 7.7, hg 12.9 CMET: wnl  Called PCP on 1/2 with worsening cough and SOB Started on doxycycline  100 mg po BID x 7 days.  Today she continue to have cough, and increase SOB above baseline as well.  Productive cough, occ seeing streaks of blood in mucus.  Possible subjective fever off and on.  B ear fullness, no pain.  No face pain.  Occ hoarse voice.  Left back chest wall pain with coughing.   BP remaining low and  O2 Sat 88 % walking in but she was off her oxygen ... now in office on [address] L [place].    Drinking liquids   She has tried to treat with  OTC sinus med.     Has history of chronic lung disease such as asthma and  COPD.  Former smoker.       Relevant past medical, surgical, family and social history reviewed and updated as indicated. Interim medical history since our last visit reviewed. Allergies and medications reviewed and updated. Outpatient Medications Prior to Visit  Medication Sig Dispense Refill   acetaminophen   (TYLENOL ) 500 MG tablet Take 500-1,000 mg by mouth every 6 (six) hours as needed for mild pain or headache.      albuterol  (VENTOLIN  HFA) 108 (90 Base) MCG/ACT inhaler Inhale 2 puffs into the lungs every 4 (four) hours as needed for wheezing or shortness of breath. 18 g 1   apixaban  (ELIQUIS ) 5 MG TABS tablet Take 1 tablet by mouth twice daily 180 tablet 1   aspirin  EC 81 MG tablet Take 1 tablet (81 mg total) by mouth daily. 150 tablet 2   atorvastatin  (LIPITOR) 40 MG tablet Take 1 tablet by mouth once daily 90 tablet 2   Cholecalciferol  (VITAMIN D3) 50 MCG (2000 UT) capsule Take 2,000 Units by mouth daily.     FLUoxetine  (PROZAC ) 20 MG capsule Take 1 capsule by mouth once daily 90 capsule 2   furosemide  (LASIX ) 40 MG tablet Take 1 tablet (40 mg total) by mouth daily as needed (swelling, wt gain or needing more oxygen ). 90 tablet 0   levETIRAcetam  (KEPPRA ) 250 MG tablet Take 250 mg by mouth 2 (two) times daily.      OXYGEN  Inhale 4 L into the lungs continuous.     pantoprazole  (PROTONIX ) 40 MG tablet Take 1 tablet (40  mg total) by mouth daily. 90 tablet 3   STIOLTO RESPIMAT  2.5-2.5 MCG/ACT AERS INHALE 2 PUFFS BY MOUTH ONCE DAILY 12 g 0   zolpidem  (AMBIEN ) 10 MG tablet TAKE 1/2 TO 1 (ONE-HALF TO ONE) TABLET BY MOUTH AT BEDTIME AS NEEDED FOR SLEEP 30 tablet 3   doxycycline  (VIBRA -TABS) 100 MG tablet Take 1 tablet (100 mg total) by mouth 2 (two) times daily. 14 tablet 0   No facility-administered medications prior to visit.     Per HPI unless specifically indicated in ROS section below Review of Systems  Constitutional:  Negative for fatigue and fever.  HENT:  Positive for congestion.   Eyes:  Negative for pain.  Respiratory:  Positive for cough and shortness of breath.   Cardiovascular:  Negative for chest pain, palpitations and leg swelling.  Gastrointestinal:  Negative for abdominal pain.  Genitourinary:  Negative for dysuria and vaginal bleeding.  Musculoskeletal:  Negative for back pain.   Neurological:  Negative for syncope, light-headedness and headaches.  Psychiatric/Behavioral:  Negative for dysphoric mood.    Objective:  BP 92/60 (BP Location: Left Arm, Patient Position: Sitting, Cuff Size: Large)   Pulse 71   Temp 98.1 F (36.7 C) (Temporal)   Ht 5' 7 (1.702 m)   Wt 206 lb (93.4 kg)   SpO2 94% Comment: 4 L O2-Nasal Canula  BMI 32.26 kg/m   Wt Readings from Last 3 Encounters:  03/03/23 206 lb (93.4 kg)  02/24/23 204 lb (92.5 kg)  02/09/23 204 lb (92.5 kg)      Physical Exam Constitutional:      General: She is not in acute distress.    Appearance: Normal appearance. She is well-developed. She is not ill-appearing or toxic-appearing.  HENT:     Head: Normocephalic.     Right Ear: Hearing, tympanic membrane, ear canal and external ear normal. Tympanic membrane is not erythematous, retracted or bulging.     Left Ear: Hearing, tympanic membrane, ear canal and external ear normal. Tympanic membrane is not erythematous, retracted or bulging.     Nose: No mucosal edema or rhinorrhea.     Right Sinus: No maxillary sinus tenderness or frontal sinus tenderness.     Left Sinus: No maxillary sinus tenderness or frontal sinus tenderness.     Mouth/Throat:     Pharynx: Uvula midline.  Eyes:     General: Lids are normal. Lids are everted, no foreign bodies appreciated.     Conjunctiva/sclera: Conjunctivae normal.     Pupils: Pupils are equal, round, and reactive to light.  Neck:     Thyroid : No thyroid  mass or thyromegaly.     Vascular: No carotid bruit.     Trachea: Trachea normal.  Cardiovascular:     Rate and Rhythm: Normal rate and regular rhythm.     Pulses: Normal pulses.     Heart sounds: Normal heart sounds, S1 normal and S2 normal. No murmur heard.    No friction rub. No gallop.  Pulmonary:     Effort: Pulmonary effort is normal. No tachypnea or respiratory distress.     Breath sounds: Normal breath sounds. Decreased air movement present. No decreased  breath sounds, wheezing, rhonchi or rales.     Comments: Moist cough Abdominal:     General: Bowel sounds are normal.     Palpations: Abdomen is soft.     Tenderness: There is no abdominal tenderness.  Musculoskeletal:     Cervical back: Normal range of motion and neck supple.  Skin:    General: Skin is warm and dry.     Findings: No rash.  Neurological:     Mental Status: She is alert.  Psychiatric:        Mood and Affect: Mood is not anxious or depressed.        Speech: Speech normal.        Behavior: Behavior normal. Behavior is cooperative.        Thought Content: Thought content normal.        Judgment: Judgment normal.       Results for orders placed or performed in visit on 02/24/23  POCT Urinalysis Dipstick (Automated)   Collection Time: 02/24/23  9:51 AM  Result Value Ref Range   Color, UA Yellow    Clarity, UA Clear    Glucose, UA Negative Negative   Bilirubin, UA Negative    Ketones, UA Trace    Spec Grav, UA 1.020 1.010 - 1.025   Blood, UA Negative    pH, UA 5.5 5.0 - 8.0   Protein, UA Positive (A) Negative   Urobilinogen, UA 0.2 0.2 or 1.0 E.U./dL   Nitrite, UA Negative    Leukocytes, UA Negative Negative  CBC with Differential/Platelet   Collection Time: 02/24/23 10:16 AM  Result Value Ref Range   WBC 7.7 4.0 - 10.5 K/uL   RBC 4.31 3.87 - 5.11 Mil/uL   Hemoglobin 12.9 12.0 - 15.0 g/dL   HCT 60.6 63.9 - 53.9 %   MCV 91.2 78.0 - 100.0 fl   MCHC 32.8 30.0 - 36.0 g/dL   RDW 85.6 88.4 - 84.4 %   Platelets 292.0 150.0 - 400.0 K/uL   Neutrophils Relative % 75.6 43.0 - 77.0 %   Lymphocytes Relative 11.5 (L) 12.0 - 46.0 %   Monocytes Relative 8.3 3.0 - 12.0 %   Eosinophils Relative 3.4 0.0 - 5.0 %   Basophils Relative 1.2 0.0 - 3.0 %   Neutro Abs 5.8 1.4 - 7.7 K/uL   Lymphs Abs 0.9 0.7 - 4.0 K/uL   Monocytes Absolute 0.6 0.1 - 1.0 K/uL   Eosinophils Absolute 0.3 0.0 - 0.7 K/uL   Basophils Absolute 0.1 0.0 - 0.1 K/uL  Comprehensive metabolic panel    Collection Time: 02/24/23 10:16 AM  Result Value Ref Range   Sodium 137 135 - 145 mEq/L   Potassium 4.5 3.5 - 5.1 mEq/L   Chloride 101 96 - 112 mEq/L   CO2 25 19 - 32 mEq/L   Glucose, Bld 87 70 - 99 mg/dL   BUN 22 6 - 23 mg/dL   Creatinine, Ser 8.91 0.40 - 1.20 mg/dL   Total Bilirubin 0.8 0.2 - 1.2 mg/dL   Alkaline Phosphatase 94 39 - 117 U/L   AST 22 0 - 37 U/L   ALT 19 0 - 35 U/L   Total Protein 7.5 6.0 - 8.3 g/dL   Albumin 4.5 3.5 - 5.2 g/dL   GFR 49.41 (L) >39.99 mL/min   Calcium  9.6 8.4 - 10.5 mg/dL    Assessment and Plan  SOB (shortness of breath) Assessment & Plan:  Acute, persistent cough despite 7 days doxy.. concerning for possible  pneumonia/ COPD/asthma exacerbation.  Will eval wit CXR.[name] plan broadening or continuation of antibiotics.  Treat with prednisone  taper. Can use benzonatate  as needed for cough.  Continue asthma/COPD controller, albuterol  prn and continuous oxygen .  Return and ER precautions provided.   Orders: -     DG Chest 2 View; Future  Persistent cough  Other orders -     predniSONE ; 3 tabs by mouth daily x 3 days, then 2 tabs by mouth daily x 2 days then 1 tab by mouth daily x 2 days  Dispense: 15 tablet; Refill: 0 -     Benzonatate ; Take 1 capsule (200 mg total) by mouth 2 (two) times daily as needed for cough.  Dispense: 20 capsule; Refill: 0    No follow-ups on file.   Greig Ring, MD

## 2023-03-03 NOTE — Assessment & Plan Note (Signed)
 Acute, persistent cough despite 7 days doxy.. concerning for possible  pneumonia/ COPD/asthma exacerbation.  Will eval wit CXR.[name] plan broadening or continuation of antibiotics.  Treat with prednisone  taper. Can use benzonatate  as needed for cough.  Continue asthma/COPD controller, albuterol  prn and continuous oxygen .  Return and ER precautions provided.

## 2023-03-05 ENCOUNTER — Ambulatory Visit (INDEPENDENT_AMBULATORY_CARE_PROVIDER_SITE_OTHER): Payer: Medicare Other

## 2023-03-05 DIAGNOSIS — I442 Atrioventricular block, complete: Secondary | ICD-10-CM

## 2023-03-05 NOTE — Progress Notes (Signed)
 Noted.

## 2023-03-06 ENCOUNTER — Ambulatory Visit (INDEPENDENT_AMBULATORY_CARE_PROVIDER_SITE_OTHER): Payer: Medicare Other

## 2023-03-06 VITALS — Ht 69.0 in | Wt 206.0 lb

## 2023-03-06 DIAGNOSIS — Z0000 Encounter for general adult medical examination without abnormal findings: Secondary | ICD-10-CM

## 2023-03-06 LAB — CUP PACEART REMOTE DEVICE CHECK
Battery Remaining Longevity: 132 mo
Battery Remaining Percentage: 100 %
Brady Statistic RA Percent Paced: 2 %
Brady Statistic RV Percent Paced: 100 %
Date Time Interrogation Session: 20250109132000
Implantable Lead Connection Status: 753985
Implantable Lead Connection Status: 753985
Implantable Lead Implant Date: 20130830
Implantable Lead Implant Date: 20130830
Implantable Lead Location: 753859
Implantable Lead Location: 753860
Implantable Lead Model: 4456
Implantable Lead Model: 4479
Implantable Lead Serial Number: 473325
Implantable Lead Serial Number: 523784
Implantable Pulse Generator Implant Date: 20211011
Lead Channel Impedance Value: 445 Ohm
Lead Channel Impedance Value: 606 Ohm
Lead Channel Pacing Threshold Amplitude: 0.5 V
Lead Channel Pacing Threshold Pulse Width: 0.4 ms
Lead Channel Setting Pacing Amplitude: 2 V
Lead Channel Setting Pacing Amplitude: 2 V
Lead Channel Setting Pacing Pulse Width: 0.4 ms
Lead Channel Setting Sensing Sensitivity: 2.5 mV
Pulse Gen Serial Number: 949303
Zone Setting Status: 755011

## 2023-03-06 NOTE — Progress Notes (Signed)
 Subjective:   Diana Cook is a 73 y.o. female who presents for Medicare Annual (Subsequent) preventive examination.  Visit Complete: Virtual I connected with  Diana Cook on 03/06/23 by a audio enabled telemedicine application and verified that I am speaking with the correct person using two identifiers.  Patient Location: Home  Provider Location: Home Office  I discussed the limitations of evaluation and management by telemedicine. The patient expressed understanding and agreed to proceed.  Vital Signs: Because this visit was a virtual/telehealth visit, some criteria may be missing or patient reported. Any vitals not documented were not able to be obtained and vitals that have been documented are patient reported.  Patient Medicare AWV questionnaire was completed by the patient on (not done); I have confirmed that all information answered by patient is correct and no changes since this date.  Cardiac Risk Factors include: advanced age (>26men, >23 women);dyslipidemia;obesity (BMI >30kg/m2);sedentary lifestyle    Objective:    Today's Vitals   03/06/23 1305 03/06/23 1306  Weight: 206 lb (93.4 kg)   Height: 5' 9 (1.753 m)   PainSc:  5    Body mass index is 30.42 kg/m.     03/06/2023    1:21 PM 12/24/2022   10:31 AM 06/09/2022   10:27 AM 04/30/2022    1:22 PM 04/30/2022    1:00 PM 03/12/2022    1:06 PM 03/05/2022    2:37 PM  Advanced Directives  Does Patient Have a Medical Advance Directive? Yes Yes Yes No Yes Yes Yes  Type of Estate Agent of [place];Living will Healthcare Power of [place];Living will Living will  Living will;Healthcare Power of [employer] Power of [place];Living will   Does patient want to make changes to medical advance directive?  No - Patient declined No - Patient declined  No - Patient declined  No - Patient declined  Copy of Healthcare Power of Attorney in Chart? No - copy requested No - copy requested   No - copy  requested No - copy requested   Would patient like information on creating a medical advance directive?    No - Patient declined       Current Medications (verified) Outpatient Encounter Medications as of 03/06/2023  Medication Sig   acetaminophen  (TYLENOL ) 500 MG tablet Take 500-1,000 mg by mouth every 6 (six) hours as needed for mild pain or headache.    albuterol  (VENTOLIN  HFA) 108 (90 Base) MCG/ACT inhaler Inhale 2 puffs into the lungs every 4 (four) hours as needed for wheezing or shortness of breath.   apixaban  (ELIQUIS ) 5 MG TABS tablet Take 1 tablet by mouth twice daily   aspirin  EC 81 MG tablet Take 1 tablet (81 mg total) by mouth daily.   atorvastatin  (LIPITOR) 40 MG tablet Take 1 tablet by mouth once daily   benzonatate  (TESSALON ) 200 MG capsule Take 1 capsule (200 mg total) by mouth 2 (two) times daily as needed for cough.   Cholecalciferol  (VITAMIN D3) 50 MCG (2000 UT) capsule Take 2,000 Units by mouth daily.   FLUoxetine  (PROZAC ) 20 MG capsule Take 1 capsule by mouth once daily   furosemide  (LASIX ) 40 MG tablet Take 1 tablet (40 mg total) by mouth daily as needed (swelling, wt gain or needing more oxygen ).   levETIRAcetam  (KEPPRA ) 250 MG tablet Take 250 mg by mouth 2 (two) times daily.    OXYGEN  Inhale 4 L into the lungs continuous.   pantoprazole  (PROTONIX ) 40 MG tablet Take 1 tablet (  40 mg total) by mouth daily.   predniSONE  (DELTASONE ) 20 MG tablet 3 tabs by mouth daily x 3 days, then 2 tabs by mouth daily x 2 days then 1 tab by mouth daily x 2 days   STIOLTO RESPIMAT  2.5-2.5 MCG/ACT AERS INHALE 2 PUFFS BY MOUTH ONCE DAILY   zolpidem  (AMBIEN ) 10 MG tablet TAKE 1/2 TO 1 (ONE-HALF TO ONE) TABLET BY MOUTH AT BEDTIME AS NEEDED FOR SLEEP   No facility-administered encounter medications on file as of 03/06/2023.    Allergies (verified) Codeine and Morphine  and codeine   History: Past Medical History:  Diagnosis Date   Allergic rhinitis    Arthritis    Asthma    as a child,  mild now   Breast cancer (HCC) 12/2015   right breast cancer, lumpectomy and mammosite    Colon polyps    colonoscopy 7/08, tubular adenoma   Complete heart block (HCC) 09/2011   s/p PPM implanted in [facility]   COPD (chronic obstructive pulmonary disease) (HCC)    Myocardial infarction (HCC) 2011   Osteopenia 10/2015   Pacemaker    2011   Paroxysmal atrial fibrillation (HCC) 03/2021   Incidentally detected on pacemaker interrogation   Personal history of radiation therapy 2017   right breast ca, mammosite placed   Rotator cuff tear, right 02/2022   Had an MRI in [place] I have 2 tears.   Seizure disorder (HCC)    Seizures (HCC)    first one was when she was 73 years old    Small bowel obstruction (HCC)    1988 and 2002   Tobacco abuse    Past Surgical History:  Procedure Laterality Date   ABDOMINAL HYSTERECTOMY     BREAST BIOPSY Right 2007   benign inflammatory changes, mass due to underwire bra   BREAST BIOPSY Left 01/02/2016   columnar cell changes without atypical hyperplasia.   BREAST BIOPSY Right 12/06/2015   rt breast mass 10:00, bx done at Dr. Gilberto office, invasive ductal carcinoma   BREAST EXCISIONAL BIOPSY Left 01/02/2016   COLUMNAR CELL CHANGE AND HYPERPLASIA ASSOCIATED WITH LUMINAL AND STROMAL CALCIFICATIONS   BREAST LUMPECTOMY Right 01/02/2016   invasive mammary carcinoma, clear margins, negative LN   BREAST LUMPECTOMY WITH SENTINEL LYMPH NODE BIOPSY Right 01/02/2016   pT1c, N0; ER/ PR 100%; Her 2 neu not over expressed: BREAST LUMPECTOMY WITH SENTINEL LYMPH NODE BX;  Surgeon: Reyes LELON Cota, MD;  Location: [facility] ORS;  Service: General;  Laterality: Right;   CARDIAC CATHETERIZATION     CAROTID PTA/STENT INTERVENTION Right 04/29/2022   Procedure: CAROTID PTA/STENT INTERVENTION;  Surgeon: Jama Cordella MATSU, MD;  Location: [facility] INVASIVE CV LAB;  Service: Cardiovascular;  Laterality: Right;   CATARACT EXTRACTION Bilateral    COLONOSCOPY  10/2015   Dr Teressa    COLONOSCOPY WITH PROPOFOL  N/A 08/19/2018   Procedure: COLONOSCOPY WITH PROPOFOL ;  Surgeon: Janalyn Keene NOVAK, MD;  Location: [facility] ENDOSCOPY;  Service: Endoscopy;  Laterality: N/A;   ESOPHAGOGASTRODUODENOSCOPY (EGD) WITH PROPOFOL  N/A 08/19/2018   Procedure: ESOPHAGOGASTRODUODENOSCOPY (EGD) WITH PROPOFOL ;  Surgeon: Janalyn Keene NOVAK, MD;  Location: [facility] ENDOSCOPY;  Service: Endoscopy;  Laterality: N/A;   EXPLORATORY LAPAROTOMY  01/25/2001   Exploratory laparotomy, lysis of adhesions, identification of internal hernia secondary to omental adhesion. Prolonged postoperative ileus.   gyn surgery  1993   hysterectomy- form endometriosis   LAPAROSCOPY     PACEMAKER INSERTION  10/24/11   [employer] Advantio dual chamber PPM implanted by Dr  Ronny at [facility] in [place]   PPM GENERATOR CHANGEOUT N/A 12/05/2019   Procedure: PPM GENERATOR CHANGEOUT;  Surgeon: Fernande Elspeth BROCKS, MD;  Location: [facility] INVASIVE CV LAB;  Service: Cardiovascular;  Laterality: N/A;   RIGHT/LEFT HEART CATH AND CORONARY ANGIOGRAPHY Bilateral 07/19/2019   Procedure: RIGHT/LEFT HEART CATH AND CORONARY ANGIOGRAPHY;  Surgeon: Mady Bruckner, MD;  Location: [facility] INVASIVE CV LAB;  Service: Cardiovascular;  Laterality: Bilateral;   TEMPORARY PACEMAKER N/A 12/05/2019   Procedure: TEMPORARY PACEMAKER;  Surgeon: Fernande Elspeth BROCKS, MD;  Location: [facility] INVASIVE CV LAB;  Service: Cardiovascular;  Laterality: N/A;   Family History  Problem Relation Age of Onset   Stroke Mother    Heart disease Mother 26       MI and CABG   Dementia Mother    Coronary artery disease Father    Parkinsonism Father    Breast cancer Sister    Cancer - Cervical Daughter 3       died 03-17-23   Heart attack Brother 80   Colon cancer Neg Hx    Social History   Socioeconomic History   Marital status: Married    Spouse name: Tom   Number of children: Not on file   Years of education: Not on file   Highest education level: Not  on file  Occupational History    Employer: RETIRED  Tobacco Use   Smoking status: Former    Current packs/day: 0.00    Average packs/day: 1.5 packs/day for 42.0 years (63.0 ttl pk-yrs)    Types: Cigarettes    Start date: 11/10/1968    Quit date: 11/11/2010    Years since quitting: 12.3   Smokeless tobacco: Never  Vaping Use   Vaping status: Never Used  Substance and Sexual Activity   Alcohol use: Yes    Alcohol/week: 1.0 - 2.0 standard drink of alcohol    Types: 1 - 2 Glasses of wine per week    Comment: weekly   Drug use: No   Sexual activity: Never  Other Topics Concern   Not on file  Social History Narrative   Married 40 years.   Exercises, does walking tapes   Social Drivers of Health   Financial Resource Strain: Low Risk  (03/06/2023)   Overall Financial Resource Strain (CARDIA)    Difficulty of Paying Living Expenses: Not hard at all  Food Insecurity: No Food Insecurity (03/06/2023)   Hunger Vital Sign    Worried About Running Out of Food in the Last Year: Never true    Ran Out of Food in the Last Year: Never true  Transportation Needs: No Transportation Needs (03/06/2023)   PRAPARE - Administrator, Civil Service (Medical): No    Lack of Transportation (Non-Medical): No  Physical Activity: Inactive (03/06/2023)   Exercise Vital Sign    Days of Exercise per Week: 0 days    Minutes of Exercise per Session: 0 min  Stress: No Stress Concern Present (03/06/2023)   [employer] of Occupational Health - Occupational Stress Questionnaire    Feeling of Stress : Not at all  Social Connections: Socially Integrated (03/06/2023)   Social Connection and Isolation Panel [NHANES]    Frequency of Communication with Friends and Family: Three times a week    Frequency of Social Gatherings with Friends and Family: Once a week    Attends Religious Services: More than 4 times per year    Active Member of Clubs or Organizations: Yes  Attends Banker  Meetings: 1 to 4 times per year    Marital Status: Married    Tobacco Counseling  Clinical Intake:  Pre-visit preparation completed: Yes  Pain : 0-10 Pain Score: 5  Pain Type: Chronic pain Pain Location: Back Pain Orientation: Lower Pain Radiating Towards: down legs to knees Pain Descriptors / Indicators: Aching Pain Onset: More than a month ago Pain Frequency: Constant Pain Relieving Factors: injections, tylenol , gabapentin   Pain Relieving Factors: injections, tylenol , gabapentin   BMI - recorded: 30.42 Nutritional Status: BMI > 30  Obese Nutritional Risks: None Diabetes: No  How often do you need to have someone help you when you read instructions, pamphlets, or other written materials from your doctor or pharmacy?: 1 - Never  Interpreter Needed?: No  Comments: lives with husband Information entered by :: B.Oneda Duffett,LPN   Activities of Daily Living    03/06/2023    1:21 PM 04/30/2022    1:00 PM  In your present state of health, do you have any difficulty performing the following activities:  Hearing? 0   Vision? 0   Difficulty concentrating or making decisions? 1   Comment concentrating   Walking or climbing stairs? 1   Dressing or bathing? 0   Doing errands, shopping? 0 1  Preparing Food and eating ? N   Using the Toilet? N   In the past six months, have you accidently leaked urine? N   Do you have problems with loss of bowel control? N   Managing your Medications? N   Managing your Finances? N   Housekeeping or managing your Housekeeping? N     Patient Care Team: Tower, Laine LABOR, MD as PCP - General (Family Medicine) End, Lonni, MD as PCP - Cardiology (Cardiology) Fernande Elspeth BROCKS, MD as Consulting Physician (Cardiology) Carolee Manus DASEN., MD as Referring Physician (Ophthalmology) Tower, Laine LABOR, MD as Consulting Physician (Family Medicine) Dessa Reyes ORN, MD (General Surgery)  Indicate any recent Medical Services you may have received from  other than [facility] providers in the past year (date may be approximate).     Assessment:   This is a routine wellness examination for Ramon.  Hearing/Vision screen Hearing Screening - Comments:: Pt says her hearing is good Vision Screening - Comments:: Pt says her vision is good  Dr Carolee   Goals Addressed             This Visit's Progress    COMPLETED: Patient Stated   On track    Starting 12/08/2017, I will continue to take medications as prescribed.      Patient Stated   Not on track    12/28/2018, I will try to start losing some weight in the future.     COMPLETED: Patient Stated   Not on track    Would like to exercise and eat less. Pt says she cannot do this anymore with verigo     Patient Stated   Not on track    Working with PT for balance        Depression Screen    03/06/2023    1:15 PM 10/08/2022   11:38 AM 09/18/2022    9:32 AM 03/12/2022    1:08 PM 09/17/2021    2:30 PM 07/29/2021    8:09 AM 05/16/2021    4:43 PM  PHQ 2/9 Scores  PHQ - 2 Score 0 0 1 0 0 0 2  PHQ- 9 Score  8 6    11  Fall Risk    03/06/2023    1:12 PM 10/08/2022   11:38 AM 09/18/2022    9:32 AM 03/12/2022    1:07 PM 09/17/2021    2:29 PM  Fall Risk   Falls in the past year? 0 1 1 1 1   Number falls in past yr: 0 0 0 1 0  Injury with Fall? 0 1 1 1  0  Comment    tore rotator cuff right arm   Risk for fall due to : No Fall Risks  History of fall(s) History of fall(s);Impaired balance/gait;Impaired mobility;Impaired vision   Follow up Education provided;Falls prevention discussed Falls evaluation completed Falls evaluation completed Falls prevention discussed Falls evaluation completed    MEDICARE RISK AT HOME: Medicare Risk at Home Any stairs in or around the home?: No If so, are there any without handrails?: No Home free of loose throw rugs in walkways, pet beds, electrical cords, etc?: Yes Adequate lighting in your home to reduce risk of falls?: Yes Life alert?: No Use of a cane,  walker or w/c?: Yes (walker) Grab bars in the bathroom?: Yes Shower chair or bench in shower?: Yes Elevated toilet seat or a handicapped toilet?: Yes  TIMED UP AND GO:  Was the test performed?  No    Cognitive Function:    03/09/2020   12:09 PM 12/28/2018   12:20 PM 12/08/2017   12:54 PM 09/30/2016    1:00 PM 07/26/2015    1:38 PM  MMSE - Mini Mental State Exam  Orientation to time 5 5 5 5 5   Orientation to Place 5 5 5 5 5   Registration 3 3 3 3 3   Attention/ Calculation 5 5 0 0 0  Recall 3 3 3 3 2   Recall-comments     pt was unable to recall 1 of 3 words  Language- name 2 objects   0 0 0  Language- repeat 1 1 1 1 1   Language- follow 3 step command   3 3 3   Language- read & follow direction   0 0 0  Write a sentence   0 0 0  Copy design   0 0 0  Total score   20 20 19         03/06/2023    1:23 PM 03/12/2022    1:11 PM  6CIT Screen  What Year? 0 points 0 points  What month? 0 points 0 points  What time? 0 points 0 points  Count back from 20 0 points 0 points  Months in reverse 4 points 4 points  Repeat phrase 2 points 0 points  Total Score 6 points 4 points    Immunizations Immunization History  Administered Date(s) Administered   Fluad Quad(high Dose 65+) 10/24/2020   Influenza Whole 02/24/2005   Influenza, High Dose Seasonal PF 11/04/2018, 11/22/2019, 12/31/2022   Influenza,inj,Quad PF,6+ Mos 12/03/2012, 12/09/2013, 01/16/2015, 02/06/2016, 11/04/2016, 12/08/2017   Influenza-Unspecified 10/25/2021   PFIZER Comirnaty(Gray Top)Covid-19 Tri-Sucrose Vaccine 07/06/2020   PFIZER(Purple Top)SARS-COV-2 Vaccination 02/28/2019, 03/21/2019, 11/14/2019   PNEUMOCOCCAL CONJUGATE-20 07/06/2020   Pneumococcal Conjugate-13 07/11/2014   Pneumococcal Polysaccharide-23 03/21/2005, 07/26/2015   Rsv, Bivalent, Protein Subunit Rsvpref,pf [place]) 12/31/2022   Td 01/09/1999, 06/14/2007   Zoster, Live 09/07/2015    TDAP status: Up to date  Flu Vaccine status: Up to  date  Pneumococcal vaccine status: Up to date  Covid-19 vaccine status: Completed vaccines  Qualifies for Shingles Vaccine? Yes   Zostavax completed No   Shingrix Completed?: No.  Education has been provided regarding the importance of this vaccine. Patient has been advised to call insurance company to determine out of pocket expense if they have not yet received this vaccine. Advised may also receive vaccine at local pharmacy or Health Dept. Verbalized acceptance and understanding.  Screening Tests Health Maintenance  Topic Date Due   Colonoscopy  03/13/2023 (Originally 08/18/2021)   COVID-19 Vaccine (5 - 2024-25 season) 03/22/2023 (Originally 10/26/2022)   Zoster Vaccines- Shingrix (1 of 2) 06/04/2023 (Originally 07/27/1969)   DTaP/Tdap/Td (3 - Tdap) 03/05/2024 (Originally 06/13/2017)   MAMMOGRAM  01/06/2024   Medicare Annual Wellness (AWV)  03/05/2024   Pneumonia Vaccine 29+ Years old  Completed   INFLUENZA VACCINE  Completed   DEXA SCAN  Completed   Hepatitis C Screening  Completed   HPV VACCINES  Aged Out   Lung Cancer Screening  Discontinued    Health Maintenance  There are no preventive care reminders to display for this patient.   Colorectal cancer screening: Type of screening: Colonoscopy. Completed no. Repeat every 5-10 years Talk w/PCP at visit  Mammogram status: No longer required due to age.  Bone Density status: Completed 10/07/2022. Results reflect: Bone density results: OSTEOPOROSIS. Repeat every 3-5 years.  Lung Cancer Screening: (Low Dose CT Chest recommended if Age 9-80 years, 20 pack-year currently smoking OR have quit w/in 15years.) does not qualify.   Lung Cancer Screening Referral: done  Additional Screening:  Hepatitis C Screening: does not qualify; Completed 09/30/2016  Vision Screening: Recommended annual ophthalmology exams for early detection of glaucoma and other disorders of the eye. Is the patient up to date with their annual eye exam?  Yes   Who is the provider or what is the name of the office in which the patient attends annual eye exams? Dr Carolee If pt is not established with a provider, would they like to be referred to a provider to establish care? No .   Dental Screening: Recommended annual dental exams for proper oral hygiene  Diabetic Foot Exam:   Community Resource Referral / Chronic Care Management: CRR required this visit?  No   CCM required this visit?  Appt scheduled with PCP and Lab visit   Plan:     I have personally reviewed and noted the following in the patient's chart:   Medical and social history Use of alcohol, tobacco or illicit drugs  Current medications and supplements including opioid prescriptions. Patient is not currently taking opioid prescriptions. Functional ability and status Nutritional status Physical activity Advanced directives List of other physicians Hospitalizations, surgeries, and ER visits in previous 12 months Vitals Screenings to include cognitive, depression, and falls Referrals and appointments  In addition, I have reviewed and discussed with patient certain preventive protocols, quality metrics, and best practice recommendations. A written personalized care plan for preventive services as well as general preventive health recommendations were provided to patient.    Erminio LITTIE Saris, LPN   [id number]   After Visit Summary: (MyChart) Due to this being a telephonic visit, the after visit summary with patients personalized plan was offered to patient via MyChart   Nurse Notes: The patient states she is doing well and has no concerns or questions at this time. Pt to talk with PCP [id number] vs cologard (for last recommended screening). Appt made for late January

## 2023-03-06 NOTE — Patient Instructions (Addendum)
 Diana Cook , Thank you for taking time to come for your Medicare Wellness Visit. I appreciate your ongoing commitment to your health goals. Please review the following plan we discussed and let me know if I can assist you in the future.   Referrals/Orders/Follow-Ups/Clinician Recommendations: none  This is a list of the screening recommended for you and due dates:  Health Maintenance  Topic Date Due   Colon Cancer Screening  03/13/2023*   COVID-19 Vaccine (5 - 2024-25 season) 03/22/2023*   Zoster (Shingles) Vaccine (1 of 2) 06/04/2023*   DTaP/Tdap/Td vaccine (3 - Tdap) 03/05/2024*   Mammogram  01/06/2024   Medicare Annual Wellness Visit  03/05/2024   Pneumonia Vaccine  Completed   Flu Shot  Completed   DEXA scan (bone density measurement)  Completed   Hepatitis C Screening  Completed   HPV Vaccine  Aged Out   Screening for Lung Cancer  Discontinued  *Topic was postponed. The date shown is not the original due date.    Advanced directives: (Copy Requested) Please bring a copy of your health care power of attorney and living will to the office to be added to your chart at your convenience.  Next Medicare Annual Wellness Visit scheduled for next year: Yes 03/08/2024 @ 1:40pm televisit

## 2023-03-10 ENCOUNTER — Ambulatory Visit (HOSPITAL_COMMUNITY)
Admission: RE | Admit: 2023-03-10 | Discharge: 2023-03-10 | Disposition: A | Discharge status: Home / Self Care | Payer: Medicare Other | Source: Ambulatory Visit | Attending: Family Medicine | Admitting: Family Medicine

## 2023-03-10 DIAGNOSIS — M5416 Radiculopathy, lumbar region: Secondary | ICD-10-CM | POA: Insufficient documentation

## 2023-03-16 ENCOUNTER — Telehealth: Payer: Self-pay | Admitting: Family Medicine

## 2023-03-16 DIAGNOSIS — E041 Nontoxic single thyroid nodule: Secondary | ICD-10-CM

## 2023-03-16 DIAGNOSIS — E559 Vitamin D deficiency, unspecified: Secondary | ICD-10-CM

## 2023-03-16 DIAGNOSIS — E785 Hyperlipidemia, unspecified: Secondary | ICD-10-CM

## 2023-03-16 DIAGNOSIS — R5382 Chronic fatigue, unspecified: Secondary | ICD-10-CM

## 2023-03-16 DIAGNOSIS — K219 Gastro-esophageal reflux disease without esophagitis: Secondary | ICD-10-CM

## 2023-03-16 DIAGNOSIS — M810 Age-related osteoporosis without current pathological fracture: Secondary | ICD-10-CM

## 2023-03-16 DIAGNOSIS — R7309 Other abnormal glucose: Secondary | ICD-10-CM

## 2023-03-16 DIAGNOSIS — E7800 Pure hypercholesterolemia, unspecified: Secondary | ICD-10-CM

## 2023-03-16 DIAGNOSIS — Z79899 Other long term (current) drug therapy: Secondary | ICD-10-CM

## 2023-03-16 NOTE — Telephone Encounter (Signed)
-----   Message from Alvina Chou sent at 03/06/2023  1:49 PM EST ----- Regarding: Lab orders for Wed, 1.22.25 Patient is scheduled for CPX labs, please order future labs, Thanks , Camelia Eng

## 2023-03-18 ENCOUNTER — Other Ambulatory Visit (INDEPENDENT_AMBULATORY_CARE_PROVIDER_SITE_OTHER): Payer: Medicare Other

## 2023-03-18 ENCOUNTER — Encounter: Payer: Self-pay | Admitting: Family Medicine

## 2023-03-18 DIAGNOSIS — R5382 Chronic fatigue, unspecified: Secondary | ICD-10-CM

## 2023-03-18 DIAGNOSIS — E041 Nontoxic single thyroid nodule: Secondary | ICD-10-CM

## 2023-03-18 DIAGNOSIS — E7800 Pure hypercholesterolemia, unspecified: Secondary | ICD-10-CM | POA: Diagnosis not present

## 2023-03-18 DIAGNOSIS — E559 Vitamin D deficiency, unspecified: Secondary | ICD-10-CM | POA: Diagnosis not present

## 2023-03-18 DIAGNOSIS — Z79899 Other long term (current) drug therapy: Secondary | ICD-10-CM | POA: Diagnosis not present

## 2023-03-18 DIAGNOSIS — R7309 Other abnormal glucose: Secondary | ICD-10-CM | POA: Diagnosis not present

## 2023-03-18 LAB — COMPREHENSIVE METABOLIC PANEL
ALT: 14 U/L (ref 0–35)
AST: 17 U/L (ref 0–37)
Albumin: 4.1 g/dL (ref 3.5–5.2)
Alkaline Phosphatase: 83 U/L (ref 39–117)
BUN: 22 mg/dL (ref 6–23)
CO2: 27 meq/L (ref 19–32)
Calcium: 9.3 mg/dL (ref 8.4–10.5)
Chloride: 102 meq/L (ref 96–112)
Creatinine, Ser: 0.99 mg/dL (ref 0.40–1.20)
GFR: 56.12 mL/min — ABNORMAL LOW (ref >60.00)
Glucose, Bld: 84 mg/dL (ref 70–99)
Potassium: 5.3 meq/L — ABNORMAL HIGH (ref 3.5–5.1)
Sodium: 136 meq/L (ref 135–145)
Total Bilirubin: 0.8 mg/dL (ref 0.2–1.2)
Total Protein: 6.8 g/dL (ref 6.0–8.3)

## 2023-03-18 LAB — CBC WITH DIFFERENTIAL/PLATELET
Basophils Absolute: 0.1 10*3/uL (ref 0.0–0.1)
Basophils Relative: 1.1 % (ref 0.0–3.0)
Eosinophils Absolute: 0.3 10*3/uL (ref 0.0–0.7)
Eosinophils Relative: 3.5 % (ref 0.0–5.0)
HCT: 39.2 % (ref 36.0–46.0)
Hemoglobin: 12.8 g/dL (ref 12.0–15.0)
Lymphocytes Relative: 14.2 % (ref 12.0–46.0)
Lymphs Abs: 1.1 10*3/uL (ref 0.7–4.0)
MCHC: 32.6 g/dL (ref 30.0–36.0)
MCV: 90.2 fL (ref 78.0–100.0)
Monocytes Absolute: 0.7 10*3/uL (ref 0.1–1.0)
Monocytes Relative: 9.1 % (ref 3.0–12.0)
Neutro Abs: 5.7 10*3/uL (ref 1.4–7.7)
Neutrophils Relative %: 72.1 % (ref 43.0–77.0)
Platelets: 284 10*3/uL (ref 150.0–400.0)
RBC: 4.35 Mil/uL (ref 3.87–5.11)
RDW: 14.3 % (ref 11.5–15.5)
WBC: 7.8 10*3/uL (ref 4.0–10.5)

## 2023-03-18 LAB — HEMOGLOBIN A1C: Hgb A1c MFr Bld: 6.2 % (ref 4.6–6.5)

## 2023-03-18 LAB — LIPID PANEL
Cholesterol: 156 mg/dL (ref 0–200)
HDL: 64.1 mg/dL (ref >39.00)
LDL Cholesterol: 72 mg/dL (ref 0–99)
NonHDL: 91.41
Total CHOL/HDL Ratio: 2
Triglycerides: 98 mg/dL (ref 0.0–149.0)
VLDL: 19.6 mg/dL (ref 0.0–40.0)

## 2023-03-18 LAB — VITAMIN D 25 HYDROXY (VIT D DEFICIENCY, FRACTURES): VITD: 38.97 ng/mL (ref 30.00–100.00)

## 2023-03-18 LAB — VITAMIN B12: Vitamin B-12: 461 pg/mL (ref 211–911)

## 2023-03-18 LAB — TSH: TSH: 1.18 u[IU]/mL (ref 0.35–5.50)

## 2023-03-25 ENCOUNTER — Encounter: Payer: Self-pay | Admitting: Family Medicine

## 2023-03-25 ENCOUNTER — Ambulatory Visit: Payer: Medicare Other | Admitting: Family Medicine

## 2023-03-25 VITALS — BP 104/50 | HR 81 | Temp 97.5°F | Ht 67.25 in | Wt 205.1 lb

## 2023-03-25 DIAGNOSIS — R7309 Other abnormal glucose: Secondary | ICD-10-CM

## 2023-03-25 DIAGNOSIS — Z0000 Encounter for general adult medical examination without abnormal findings: Secondary | ICD-10-CM

## 2023-03-25 DIAGNOSIS — Z79899 Other long term (current) drug therapy: Secondary | ICD-10-CM | POA: Diagnosis not present

## 2023-03-25 DIAGNOSIS — I5032 Chronic diastolic (congestive) heart failure: Secondary | ICD-10-CM

## 2023-03-25 DIAGNOSIS — M810 Age-related osteoporosis without current pathological fracture: Secondary | ICD-10-CM | POA: Diagnosis not present

## 2023-03-25 DIAGNOSIS — E7800 Pure hypercholesterolemia, unspecified: Secondary | ICD-10-CM

## 2023-03-25 DIAGNOSIS — R944 Abnormal results of kidney function studies: Secondary | ICD-10-CM | POA: Diagnosis not present

## 2023-03-25 DIAGNOSIS — Z95811 Presence of heart assist device: Secondary | ICD-10-CM

## 2023-03-25 DIAGNOSIS — R569 Unspecified convulsions: Secondary | ICD-10-CM

## 2023-03-25 DIAGNOSIS — E559 Vitamin D deficiency, unspecified: Secondary | ICD-10-CM

## 2023-03-25 DIAGNOSIS — E875 Hyperkalemia: Secondary | ICD-10-CM

## 2023-03-25 DIAGNOSIS — Z1211 Encounter for screening for malignant neoplasm of colon: Secondary | ICD-10-CM

## 2023-03-25 DIAGNOSIS — K219 Gastro-esophageal reflux disease without esophagitis: Secondary | ICD-10-CM

## 2023-03-25 DIAGNOSIS — J432 Centrilobular emphysema: Secondary | ICD-10-CM

## 2023-03-25 DIAGNOSIS — J9611 Chronic respiratory failure with hypoxia: Secondary | ICD-10-CM

## 2023-03-25 DIAGNOSIS — R5382 Chronic fatigue, unspecified: Secondary | ICD-10-CM

## 2023-03-25 DIAGNOSIS — I2510 Atherosclerotic heart disease of native coronary artery without angina pectoris: Secondary | ICD-10-CM

## 2023-03-25 DIAGNOSIS — C3491 Malignant neoplasm of unspecified part of right bronchus or lung: Secondary | ICD-10-CM | POA: Insufficient documentation

## 2023-03-25 DIAGNOSIS — I480 Paroxysmal atrial fibrillation: Secondary | ICD-10-CM

## 2023-03-25 DIAGNOSIS — F32A Depression, unspecified: Secondary | ICD-10-CM

## 2023-03-25 NOTE — Assessment & Plan Note (Signed)
Disc goals for lipids and reasons to control them Rev last labs with pt Rev low sat fat diet in detail Atorvastatin 40 mg daily  LDL up to 72 in setting of cardiac issues  HDL up into 60s so ratio is overall down

## 2023-03-25 NOTE — Patient Instructions (Addendum)
Let us know if you change your mind about colonoscopy   Check with pharmacist about getting a tetanus shot   Stay as active as you can safely be  Add some strength training to your routine, this is important for bone and brain health and can reduce your risk of falls and help your body use insulin properly and regulate weight  Light weights, exercise bands , and internet videos are a good way to start  Yoga (chair or regular), machines , floor exercises or a gym with machines are also good options   Let's check potassium again in a month   Try to get most of your carbohydrates from produce (with the exception of white potatoes) and whole grains Eat less bread/pasta/rice/snack foods/cereals/sweets and other items from the middle of the grocery store (processed carbs)

## 2023-03-25 NOTE — Assessment & Plan Note (Signed)
Lab Results  Component Value Date   HGBA1C 6.2 03/18/2023   HGBA1C 5.9 09/18/2022   HGBA1C 5.6 07/23/2021   disc imp of low glycemic diet and wt loss to prevent DM2

## 2023-03-25 NOTE — Assessment & Plan Note (Signed)
Lab Results  Component Value Date   K 5.3 No hemolysis seen (H) 03/18/2023   Unsure cause Re check in about a month

## 2023-03-25 NOTE — Assessment & Plan Note (Signed)
Taking asa and statin  Under cardiology care

## 2023-03-25 NOTE — Assessment & Plan Note (Signed)
Reviewed health habits including diet and exercise and skin cancer prevention Reviewed appropriate screening tests for age  Also reviewed health mt list, fam hx and immunization status , as well as social and family history   See HPI Labs reviewed and ordered Declines shingrix Encouraged to ask about Td at pharmacy  Declines 3 y colonoscopy for polyps Dexa and mammo utd  Discussed fall prevention, supplements and exercise for bone density  PHQ 7  on therapy  Health Maintenance  Topic Date Due   Zoster (Shingles) Vaccine (1 of 2) 06/04/2023*   DTaP/Tdap/Td vaccine (3 - Tdap) 03/05/2024*   Colon Cancer Screening  03/24/2024*   Mammogram  01/06/2024   Medicare Annual Wellness Visit  03/05/2024   Pneumonia Vaccine  Completed   Flu Shot  Completed   DEXA scan (bone density measurement)  Completed   COVID-19 Vaccine  Completed   Hepatitis C Screening  Completed   HPV Vaccine  Aged Out   Screening for Lung Cancer  Discontinued  *Topic was postponed. The date shown is not the original due date.

## 2023-03-25 NOTE — Assessment & Plan Note (Addendum)
Controlled with protonix 40 mg daily  Encouraged to avoid dietary triggers  Unfortunately has bone loss  Has not been able to come off of this

## 2023-03-25 NOTE — Assessment & Plan Note (Signed)
Ongoing  Suspect due to copd with limited exercise ability

## 2023-03-25 NOTE — Assessment & Plan Note (Signed)
Last vitamin D Lab Results  Component Value Date   VD25OH 38.97 03/18/2023   Vitamin D level is therapeutic with current supplementation Disc importance of this to bone and overall health

## 2023-03-25 NOTE — Assessment & Plan Note (Signed)
Spiculated nodule - suspected stable  Watched by pulmonary

## 2023-03-25 NOTE — Assessment & Plan Note (Signed)
Under cardiology care Normal rate On eliquis

## 2023-03-25 NOTE — Assessment & Plan Note (Signed)
D and B12 are in normal range with supplementation

## 2023-03-25 NOTE — Progress Notes (Signed)
Subjective:    Patient ID: Diana Cook, female    DOB: December 20, 1950, 73 y.o.   MRN: [id number]  HPI  Here for health maintenance exam and to review chronic medical problems   Wt Readings from Last 3 Encounters:  03/25/23 205 lb 2 oz (93 kg)  03/06/23 206 lb (93.4 kg)  03/03/23 206 lb (93.4 kg)   31.89 kg/m  Vitals:   03/25/23 1356  BP: (!) 104/50  Pulse: 81  Temp: (!) 97.5 F (36.4 C)  SpO2: 92%    Immunization History  Administered Date(s) Administered   Fluad Quad(high Dose 65+) 10/24/2020   Influenza Whole 02/24/2005   Influenza, High Dose Seasonal PF 11/04/2018, 11/22/2019, 12/31/2022   Influenza,inj,Quad PF,6+ Mos 12/03/2012, 12/09/2013, 01/16/2015, 02/06/2016, 11/04/2016, 12/08/2017   Influenza-Unspecified 10/25/2021   PFIZER Comirnaty(Gray Top)Covid-19 Tri-Sucrose Vaccine 07/06/2020   PFIZER(Purple Top)SARS-COV-2 Vaccination 02/28/2019, 03/21/2019, 11/14/2019   PNEUMOCOCCAL CONJUGATE-20 07/06/2020   Pneumococcal Conjugate-13 07/11/2014   Pneumococcal Polysaccharide-23 03/21/2005, 07/26/2015   Rsv, Bivalent, Protein Subunit Rsvpref,pf Verdis Frederickson) 12/31/2022   Td 01/09/1999, 06/14/2007   Zoster, Live 09/07/2015    There are no preventive care reminders to display for this patient.  Shingrix - not interested   Due for tetanus shot    Mammogram 12/2022  Personal history of breast cancer  Self breast exam-no new lumps but has scar tissue that makes it hard to know  Baseline sensitivity   Gyn health -hysterectomy   Smoking in past  Still a non smoker     Colon cancer screening  colonoscopy 07/2018 with 3 y recall  Declines for now   Bone health  Dexa  09/2022  osteopenia  Falls-none  Fractures-none  Supplements - D3 Last vitamin D Lab Results  Component Value Date   VD25OH 38.97 03/18/2023    Exercise  Limited by her copd and fatigue  Did some PT in the past  Uses a sit down pedal machine to get in movement  Afraid of being mobility  impaired      Dermatology -used to see Dr Orson Aloe  Has spot on face and back to check    Mood    03/25/2023    2:04 PM 03/06/2023    1:15 PM 10/08/2022   11:38 AM 09/18/2022    9:32 AM 03/12/2022    1:08 PM  Depression screen PHQ 2/9  Decreased Interest 0 0 0 1 0  Down, Depressed, Hopeless 0 0 0 0 0  PHQ - 2 Score 0 0 0 1 0  Altered sleeping 0  0 0   Tired, decreased energy 3  2 1    Change in appetite 3  3 1    Feeling bad or failure about yourself  1  0 0   Trouble concentrating 0  0 0   Moving slowly or fidgety/restless 0  3 3   Suicidal thoughts 0  0 0   PHQ-9 Score 7  8 6    Difficult doing work/chores   Somewhat difficult Somewhat difficult   Frustrated by her medical conditions Continues fluoxetine 20 mg daily     Pulmonary status  Probable lung cancer - watching spiculated nodule (pulmonary)  No changes Very 02 dependent  Has coughing spells  Uses rescue inhaler   Cardiac status  CAD A fib  CHF -grade 1 dd Carotid stenosis    Lab Results  Component Value Date   NA 136 03/18/2023   K 5.3 No hemolysis seen (H) 03/18/2023   CO2 27 03/18/2023  GLUCOSE 84 03/18/2023   BUN 22 03/18/2023   CREATININE 0.99 03/18/2023   CALCIUM 9.3 03/18/2023   GFR 56.12 (L) 03/18/2023   GFRNONAA >60 04/30/2022   No nsaids     GERD Protonix 40 mg daily   Lab Results  Component Value Date   VITAMINB12 461 03/18/2023    Glucose Lab Results  Component Value Date   HGBA1C 6.2 03/18/2023   HGBA1C 5.9 09/18/2022   HGBA1C 5.6 07/23/2021   Prediabetes  Eats well overall      Hyperlipidemia Lab Results  Component Value Date   CHOL 156 03/18/2023   CHOL 129 09/18/2022   CHOL 123 07/23/2021   Lab Results  Component Value Date   HDL 64.10 03/18/2023   HDL 51.40 09/18/2022   HDL 49.40 07/23/2021   Lab Results  Component Value Date   LDLCALC 72 03/18/2023   LDLCALC 58 09/18/2022   LDLCALC 50 07/23/2021   Lab Results  Component Value Date   TRIG  98.0 03/18/2023   TRIG 95.0 09/18/2022   TRIG 116.0 07/23/2021   Lab Results  Component Value Date   CHOLHDL 2 03/18/2023   CHOLHDL 3 09/18/2022   CHOLHDL 2 07/23/2021   No results found for: "LDLDIRECT" Atorvastatin 40 mg daily   Not a lot of fatty food   Mild anemia in past  Better Lab Results  Component Value Date   WBC 7.8 03/18/2023   HGB 12.8 03/18/2023   HCT 39.2 03/18/2023   MCV 90.2 03/18/2023   PLT 284.0 03/18/2023    Takes keppra for seizure disorder   Patient Active Problem List   Diagnosis Date Noted   Presence of heart assist device (HCC) 03/25/2023   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/25/2023   Persistent cough 03/03/2023   Urinary urgency 02/09/2023   Carotid stenosis, symptomatic w/o infarct, right 04/29/2022   Carotid stenosis, symptomatic w/o infarct 03/23/2022   Right shoulder pain 11/25/2021   Mild anemia 11/03/2021   Thyroid nodule 09/17/2021   Dyslipidemia 09/05/2021   Depression 09/05/2021   GERD without esophagitis 09/05/2021   Vitamin D deficiency 08/20/2021   Colon cancer screening 07/30/2021   Hypercalcemia 07/29/2021   Dysuria 07/08/2021   Arterial hypotension 07/08/2021   Paroxysmal atrial fibrillation (HCC) 04/11/2021   Current use of proton pump inhibitor 10/24/2020   Sleep apnea 10/24/2020   Fatigue 10/24/2020   Radicular pain in left arm 03/02/2020   Neck pain on left side 02/15/2020   Paresthesia 02/15/2020   Chronic respiratory failure with hypoxia (HCC) 11/17/2019   Chronic heart failure with preserved ejection fraction (HFpEF) (HCC) 08/11/2019   Coronary artery disease involving native coronary artery of native heart without angina pectoris 06/20/2019   Gastroesophageal reflux disease    Stomach irritation    Abnormal CT scan, esophagus    Polyp of ascending colon    Personal history of tobacco use, presenting hazards to health 01/18/2018   Grade I diastolic dysfunction 12/21/2017   Globus pharyngeus  12/21/2017   Chronic obstructive pulmonary disease (HCC) 12/15/2017   Hyperlipidemia 12/07/2017   Seizures (HCC) 10/01/2016   Elevated glucose 08/31/2016   Grief reaction 04/11/2016   History of breast cancer 12/11/2015   Osteoporosis 11/22/2015   Estrogen deficiency 09/07/2015   Wrinkles 09/07/2015   Post herpetic neuralgia 09/01/2014   Encounter for Medicare annual wellness exam 07/11/2014   Routine general medical examination at a health care facility 06/26/2013   H/O small bowel obstruction 04/15/2013  SOB (shortness of breath) 02/10/2013   Complete heart block (HCC) 11/03/2011   Pacemaker-[employer] 11/03/2011   Lumbar spinal stenosis 09/02/2011   POSTMENOPAUSAL STATUS 08/06/2009   HIDRADENITIS SUPPURATIVA 06/26/2008   HERNIATED DISC 12/14/2007   BACK, LOWER, PAIN 12/02/2007   Hyperkalemia 08/06/2007   History of colonic polyps 09/14/2006   Former smoker 06/10/2006   Asthma 06/10/2006   H/O idiopathic seizure 06/10/2006   Past Medical History:  Diagnosis Date   Allergic rhinitis    Arthritis    Asthma    as a child, mild now   Breast cancer (HCC) 12/2015   right breast cancer, lumpectomy and mammosite    Colon polyps    colonoscopy 7/08, tubular adenoma   Complete heart block (HCC) 09/2011   s/p PPM implanted in [place] [facility]   COPD (chronic obstructive pulmonary disease) (HCC)    Myocardial infarction (HCC) 2011   Osteopenia 10/2015   Pacemaker    2011   Paroxysmal atrial fibrillation (HCC) 03/2021   Incidentally detected on pacemaker interrogation   Personal history of radiation therapy 2017   right breast ca, mammosite placed   Rotator cuff tear, right 02/2022   Had an MRI in [place] "I have 2 tears."   Seizure disorder (HCC)    Seizures (HCC)    first one was when she was 73 years old    Small bowel obstruction (HCC)    1988 and 2002   Tobacco abuse    Past Surgical History:  Procedure Laterality Date   ABDOMINAL HYSTERECTOMY     BREAST  BIOPSY Right 2007   benign inflammatory changes, mass due to underwire bra   BREAST BIOPSY Left 01/02/2016   columnar cell changes without atypical hyperplasia.   BREAST BIOPSY Right 12/06/2015   rt breast mass 10:00, bx done at Dr. Arvella Nigh office, invasive ductal carcinoma   BREAST EXCISIONAL BIOPSY Left 01/02/2016   COLUMNAR CELL CHANGE AND HYPERPLASIA ASSOCIATED WITH LUMINAL AND STROMAL CALCIFICATIONS   BREAST LUMPECTOMY Right 01/02/2016   invasive mammary carcinoma, clear margins, negative LN   BREAST LUMPECTOMY WITH SENTINEL LYMPH NODE BIOPSY Right 01/02/2016   pT1c, N0; ER/ PR 100%; Her 2 neu not over expressed: BREAST LUMPECTOMY WITH SENTINEL LYMPH NODE BX;  Surgeon: Earline Mayotte, MD;  Location: [facility] ORS;  Service: General;  Laterality: Right;   CARDIAC CATHETERIZATION     CAROTID PTA/STENT INTERVENTION Right 04/29/2022   Procedure: CAROTID PTA/STENT INTERVENTION;  Surgeon: Renford Dills, MD;  Location: [facility] INVASIVE CV LAB;  Service: Cardiovascular;  Laterality: Right;   CATARACT EXTRACTION Bilateral    COLONOSCOPY  10/2015   Dr Christella Hartigan   COLONOSCOPY WITH PROPOFOL N/A 08/19/2018   Procedure: COLONOSCOPY WITH PROPOFOL;  Surgeon: Pasty Spillers, MD;  Location: [facility] ENDOSCOPY;  Service: Endoscopy;  Laterality: N/A;   ESOPHAGOGASTRODUODENOSCOPY (EGD) WITH PROPOFOL N/A 08/19/2018   Procedure: ESOPHAGOGASTRODUODENOSCOPY (EGD) WITH PROPOFOL;  Surgeon: Pasty Spillers, MD;  Location: [facility] ENDOSCOPY;  Service: Endoscopy;  Laterality: N/A;   EXPLORATORY LAPAROTOMY  01/25/2001   Exploratory laparotomy, lysis of adhesions, identification of internal hernia secondary to omental adhesion. Prolonged postoperative ileus.   gyn surgery  1993   hysterectomy- form endometriosis   LAPAROSCOPY     PACEMAKER INSERTION  10/24/11   [employer] Advantio dual chamber PPM implanted by Dr Venda Rodes at [facility] in [place]   PPM GENERATOR CHANGEOUT N/A 12/05/2019    Procedure: PPM GENERATOR Janeann Merl;  Surgeon: Duke Salvia, MD;  Location: [facility] INVAS[facility] CV LAB;  Service: Cardiovascular;  Laterality: N/A;   RIGHT/LEFT HEART CATH AND CORONARY ANGIOGRAPHY Bilateral 07/19/2019   Procedure: RIGHT/LEFT HEART CATH AND CORONARY ANGIOGRAPHY;  Surgeon: Yvonne Kendall, MD;  Location: [facility] INVASIVE CV LAB;  Service: Cardiovascular;  Laterality: Bilateral;   TEMPORARY PACEMAKER N/A 12/05/2019   Procedure: TEMPORARY PACEMAKER;  Surgeon: Duke Salvia, MD;  Location: [facility] INVASIVE CV LAB;  Service: Cardiovascular;  Laterality: N/A;   Social History   Tobacco Use   Smoking status: Former    Current packs/day: 0.00    Average packs/day: 1.5 packs/day for 42.0 years (63.0 ttl pk-yrs)    Types: Cigarettes    Start date: 11/10/1968    Quit date: 11/11/2010    Years since quitting: 12.3   Smokeless tobacco: Never  Vaping Use   Vaping status: Never Used  Substance Use Topics   Alcohol use: Yes    Alcohol/week: 1.0 - 2.0 standard drink of alcohol    Types: 1 - 2 Glasses of wine per week    Comment: weekly   Drug use: No   Family History  Problem Relation Age of Onset   Stroke Mother    Heart disease Mother 41       MI and CABG   Dementia Mother    Coronary artery disease Father    Parkinsonism Father    Breast cancer Sister    Cancer - Cervical Daughter 47       died 03-15-23   Heart attack Brother 18   Colon cancer Neg Hx    Allergies  Allergen Reactions   Codeine Nausea And Vomiting   Morphine And Codeine Nausea Only   Current Outpatient Medications on File Prior to Visit  Medication Sig Dispense Refill   acetaminophen (TYLENOL) 500 MG tablet Take 500-1,000 mg by mouth every 6 (six) hours as needed for mild pain or headache.      albuterol (VENTOLIN HFA) 108 (90 Base) MCG/ACT inhaler Inhale 2 puffs into the lungs every 4 (four) hours as needed for wheezing or shortness of breath. 18 g 1   apixaban (ELIQUIS) 5 MG TABS tablet Take 1 tablet by mouth twice  daily 180 tablet 1   aspirin EC 81 MG tablet Take 1 tablet (81 mg total) by mouth daily. 150 tablet 2   atorvastatin (LIPITOR) 40 MG tablet Take 1 tablet by mouth once daily 90 tablet 2   benzonatate (TESSALON) 200 MG capsule Take 1 capsule (200 mg total) by mouth 2 (two) times daily as needed for cough. 20 capsule 0   Cholecalciferol (VITAMIN D3) 50 MCG (2000 UT) capsule Take 2,000 Units by mouth daily.     FLUoxetine (PROZAC) 20 MG capsule Take 1 capsule by mouth once daily 90 capsule 2   furosemide (LASIX) 40 MG tablet Take 1 tablet (40 mg total) by mouth daily as needed (swelling, wt gain or needing more oxygen). 90 tablet 0   gabapentin (NEURONTIN) 100 MG capsule Take 100 mg by mouth daily as needed.     levETIRAcetam (KEPPRA) 250 MG tablet Take 250 mg by mouth 2 (two) times daily.      OXYGEN Inhale 4 L into the lungs continuous.     pantoprazole (PROTONIX) 40 MG tablet Take 1 tablet (40 mg total) by mouth daily. 90 tablet 3   STIOLTO RESPIMAT 2.5-2.5 MCG/ACT AERS INHALE 2 PUFFS BY MOUTH ONCE DAILY 12 g 0   zolpidem (AMBIEN) 10 MG tablet TAKE 1/2 TO 1 (ONE-HALF  TO ONE) TABLET BY MOUTH AT BEDTIME AS NEEDED FOR SLEEP 30 tablet 3   No current facility-administered medications on file prior to visit.    Review of Systems  Constitutional:  Positive for fatigue. Negative for activity change, appetite change, fever and unexpected weight change.  HENT:  Negative for congestion, ear pain, rhinorrhea, sinus pressure and sore throat.   Eyes:  Negative for pain, redness and visual disturbance.  Respiratory:  Positive for cough, chest tightness and shortness of breath. Negative for wheezing and stridor.   Cardiovascular:  Negative for chest pain and palpitations.  Gastrointestinal:  Negative for abdominal pain, blood in stool, constipation and diarrhea.  Endocrine: Negative for polydipsia and polyuria.  Genitourinary:  Negative for dysuria, frequency and urgency.  Musculoskeletal:  Negative for  arthralgias, back pain and myalgias.  Skin:  Negative for pallor and rash.  Allergic/Immunologic: Negative for environmental allergies.  Neurological:  Negative for dizziness, syncope and headaches.  Hematological:  Negative for adenopathy. Does not bruise/bleed easily.  Psychiatric/Behavioral:  Positive for dysphoric mood. Negative for decreased concentration. The patient is not nervous/anxious.        Objective:   Physical Exam Constitutional:      General: She is not in acute distress.    Appearance: Normal appearance. She is well-developed. She is obese. She is not ill-appearing or diaphoretic.  HENT:     Head: Normocephalic and atraumatic.     Right Ear: Tympanic membrane, ear canal and external ear normal.     Left Ear: Tympanic membrane, ear canal and external ear normal.     Nose: Nose normal. No congestion.     Mouth/Throat:     Mouth: Mucous membranes are moist.     Pharynx: Oropharynx is clear. No posterior oropharyngeal erythema.  Eyes:     General: No scleral icterus.    Extraocular Movements: Extraocular movements intact.     Conjunctiva/sclera: Conjunctivae normal.     Pupils: Pupils are equal, round, and reactive to light.  Neck:     Thyroid: No thyromegaly.     Vascular: No carotid bruit or JVD.  Cardiovascular:     Rate and Rhythm: Normal rate and regular rhythm.     Pulses: Normal pulses.     Heart sounds: Normal heart sounds.     No gallop.  Pulmonary:     Effort: Pulmonary effort is normal. No respiratory distress.     Breath sounds: Normal breath sounds. No wheezing.     Comments: Good air exch Chest:     Chest wall: No tenderness.  Abdominal:     General: Bowel sounds are normal. There is no distension or abdominal bruit.     Palpations: Abdomen is soft. There is no mass.     Tenderness: There is no abdominal tenderness.     Hernia: No hernia is present.  Genitourinary:    Comments: Breast exam: No mass, nodules, thickening, tenderness, bulging,  retraction, inflamation, nipple discharge or skin changes noted.  No axillary or clavicular LA.     Musculoskeletal:        General: No tenderness. Normal range of motion.     Cervical back: Normal range of motion and neck supple. No rigidity. No muscular tenderness.     Right lower leg: No edema.     Left lower leg: No edema.     Comments: No kyphosis   Lymphadenopathy:     Cervical: No cervical adenopathy.  Skin:    General: Skin is warm  and dry.     Coloration: Skin is not pale.     Findings: No erythema or rash.     Comments: Solar lentigines diffusely  Tan 2 mm skin tag on right back   One b9 appearing 2-3 mm fleshy mole on right face   Some seb hypertrophy   Neurological:     Mental Status: She is alert. Mental status is at baseline.     Cranial Nerves: No cranial nerve deficit.     Motor: No abnormal muscle tone.     Coordination: Coordination normal.     Gait: Gait normal.     Deep Tendon Reflexes: Reflexes are normal and symmetric. Reflexes normal.  Psychiatric:        Mood and Affect: Mood normal.        Cognition and Memory: Cognition and memory normal.           Assessment & Plan:   Problem List Items Addressed This Visit       Cardiovascular and Mediastinum   Paroxysmal atrial fibrillation (HCC)   Under cardiology care Normal rate On eliquis        Coronary artery disease involving native coronary artery of native heart without angina pectoris   Taking asa and statin  Under cardiology care       Chronic heart failure with preserved ejection fraction (HFpEF) (HCC)   Under cardiology care  Lasix 40 mg prn          Respiratory   Non-small cell cancer of right lung (HCC)   Spiculated nodule - suspected stable  Watched by pulmonary       Chronic respiratory failure with hypoxia (HCC)   Continues ATC 02  Sat after walking down [place] without it was 92% Due to copd  Continues pulmonary f/u      Chronic obstructive pulmonary disease (HCC)    Ongoing care from pulmonary  02 dependent  Using stiolto 2 puffs daily  Prn ventolin         Digestive   Gastroesophageal reflux disease   Controlled with protonix 40 mg daily  Encouraged to avoid dietary triggers  Unfortunately has bone loss  Has not been able to come off of this         Musculoskeletal and Integument   Osteoporosis   Dexa 09/2022- in osteopenia range  No falls or fractures  No longer smoking  Discussed fall prevention, supplements and exercise for bone density  D level in normal range         Other   Vitamin D deficiency   Last vitamin D Lab Results  Component Value Date   VD25OH 38.97 03/18/2023   Vitamin D level is therapeutic with current supplementation Disc importance of this to bone and overall health       Seizures (HCC)   None recently  Controlled well with keppra 250 mg bid Under care of neurology      Relevant Medications   gabapentin (NEURONTIN) 100 MG capsule   Routine general medical examination at a health care facility - Primary   Reviewed health habits including diet and exercise and skin cancer prevention Reviewed appropriate screening tests for age  Also reviewed health mt list, fam hx and immunization status , as well as social and family history   See HPI Labs reviewed and ordered Declines shingrix Encouraged to ask about Td at pharmacy  Declines 3 y colonoscopy for polyps Dexa and mammo utd  Discussed fall prevention, supplements and  exercise for bone density  PHQ 7  on therapy  Health Maintenance  Topic Date Due   Zoster (Shingles) Vaccine (1 of 2) 06/04/2023*   DTaP/Tdap/Td vaccine (3 - Tdap) 03/05/2024*   Colon Cancer Screening  03/24/2024*   Mammogram  01/06/2024   Medicare Annual Wellness Visit  03/05/2024   Pneumonia Vaccine  Completed   Flu Shot  Completed   DEXA scan (bone density measurement)  Completed   COVID-19 Vaccine  Completed   Hepatitis C Screening  Completed   HPV Vaccine  Aged Out    Screening for Lung Cancer  Discontinued  *Topic was postponed. The date shown is not the original due date.         Presence of heart assist device [facility])   Under cardiology care  Pacemaker  No clinical changes       Hyperlipidemia   Disc goals for lipids and reasons to control them Rev last labs with pt Rev low sat fat diet in detail Atorvastatin 40 mg daily  LDL up to 72 in setting of cardiac issues  HDL up into 60s so ratio is overall down      Hyperkalemia   Lab Results  Component Value Date   K 5.3 No hemolysis seen (H) 03/18/2023   Unsure cause Re check in about a month      Relevant Orders   Basic metabolic panel   Fatigue   Ongoing  Suspect due to copd with limited exercise ability       Elevated glucose   Lab Results  Component Value Date   HGBA1C 6.2 03/18/2023   HGBA1C 5.9 09/18/2022   HGBA1C 5.6 07/23/2021   disc imp of low glycemic diet and wt loss to prevent DM2       Depression   PHQ 7 Frustrated by medical conditions/copd Continues fluoxetine which has been helpful       Current use of proton pump inhibitor   D and B12 are in normal range with supplementation       Colon cancer screening   Colonoscopy 2020 with 3 y recall due to multiple polyps Pt declines Would be higher risk for procedure due to copd as well

## 2023-03-25 NOTE — Assessment & Plan Note (Signed)
Under cardiology care  Pacemaker  No clinical changes

## 2023-03-25 NOTE — Assessment & Plan Note (Signed)
Dexa 09/2022- in osteopenia range  No falls or fractures  No longer smoking  Discussed fall prevention, supplements and exercise for bone density  D level in normal range

## 2023-03-25 NOTE — Assessment & Plan Note (Signed)
Ongoing care from pulmonary  02 dependent  Using stiolto 2 puffs daily  Prn ventolin

## 2023-03-25 NOTE — Assessment & Plan Note (Signed)
PHQ 7 Frustrated by medical conditions/copd Continues fluoxetine which has been helpful

## 2023-03-25 NOTE — Assessment & Plan Note (Signed)
None recently  Controlled well with keppra 250 mg bid Under care of neurology

## 2023-03-25 NOTE — Assessment & Plan Note (Signed)
Colonoscopy 2020 with 3 y recall due to multiple polyps Pt declines Would be higher risk for procedure due to copd as well

## 2023-03-25 NOTE — Assessment & Plan Note (Signed)
Under cardiology care  Lasix 40 mg prn

## 2023-03-25 NOTE — Assessment & Plan Note (Signed)
Continues ATC 02  Sat after walking down [place] without it was 92% Due to copd  Continues pulmonary f/u

## 2023-04-02 ENCOUNTER — Telehealth (INDEPENDENT_AMBULATORY_CARE_PROVIDER_SITE_OTHER): Payer: Self-pay | Admitting: Nurse Practitioner

## 2023-04-02 NOTE — Telephone Encounter (Signed)
 Ingrid from [facility] called- they are faxing over a new Clearance to hold plavix  or eliquis . The one they faxed over on 03/30/23 is no longer valid. Need signature on new one.  [phone] (fax)

## 2023-04-07 NOTE — Telephone Encounter (Signed)
[facility] called about medical clearance. I will call to get a new one faxed.

## 2023-04-07 NOTE — Telephone Encounter (Signed)
Fax received, waiting on Signature.

## 2023-04-14 NOTE — Progress Notes (Signed)
 Remote pacemaker transmission.

## 2023-04-14 NOTE — Addendum Note (Signed)
 Addended by: Elease Etienne A on: 04/14/2023 03:13 PM   Modules accepted: Orders

## 2023-04-22 ENCOUNTER — Encounter: Payer: Self-pay | Admitting: Internal Medicine

## 2023-04-24 ENCOUNTER — Other Ambulatory Visit (INDEPENDENT_AMBULATORY_CARE_PROVIDER_SITE_OTHER): Payer: Medicare Other

## 2023-04-24 DIAGNOSIS — E875 Hyperkalemia: Secondary | ICD-10-CM

## 2023-04-24 LAB — BASIC METABOLIC PANEL
BUN: 30 mg/dL — ABNORMAL HIGH (ref 6–23)
CO2: 24 meq/L (ref 19–32)
Calcium: 9.6 mg/dL (ref 8.4–10.5)
Chloride: 103 meq/L (ref 96–112)
Creatinine, Ser: 1.35 mg/dL — ABNORMAL HIGH (ref 0.40–1.20)
GFR: 38.65 mL/min — ABNORMAL LOW (ref >60.00)
Glucose, Bld: 100 mg/dL — ABNORMAL HIGH (ref 70–99)
Potassium: 5.6 meq/L — ABNORMAL HIGH (ref 3.5–5.1)
Sodium: 136 meq/L (ref 135–145)

## 2023-04-26 ENCOUNTER — Encounter: Payer: Self-pay | Admitting: Family Medicine

## 2023-04-29 DIAGNOSIS — R944 Abnormal results of kidney function studies: Secondary | ICD-10-CM | POA: Insufficient documentation

## 2023-04-29 NOTE — Addendum Note (Signed)
 Addended by: Roxy Manns A on: 04/29/2023 07:55 PM   Modules accepted: Orders

## 2023-04-29 NOTE — Assessment & Plan Note (Signed)
 Lab Results  Component Value Date   NA 136 04/24/2023   K 5.6 No hemolysis seen (H) 04/24/2023   CO2 24 04/24/2023   GLUCOSE 100 (H) 04/24/2023   BUN 30 (H) 04/24/2023   CREATININE 1.35 (H) 04/24/2023   CALCIUM 9.6 04/24/2023   GFR 38.65 (L) 04/24/2023   GFRNONAA >60 04/30/2022   This is new  K is elevated Follow up here for re check and urinalysis  Also ref to nephrology

## 2023-05-04 NOTE — Addendum Note (Signed)
 Addended by: Roxy Manns A on: 05/04/2023 06:39 PM   Modules accepted: Orders

## 2023-05-05 ENCOUNTER — Other Ambulatory Visit (INDEPENDENT_AMBULATORY_CARE_PROVIDER_SITE_OTHER)

## 2023-05-05 DIAGNOSIS — R944 Abnormal results of kidney function studies: Secondary | ICD-10-CM | POA: Diagnosis not present

## 2023-05-05 DIAGNOSIS — E875 Hyperkalemia: Secondary | ICD-10-CM

## 2023-05-06 ENCOUNTER — Encounter: Payer: Self-pay | Admitting: Family Medicine

## 2023-05-06 LAB — BASIC METABOLIC PANEL
BUN: 30 mg/dL — ABNORMAL HIGH (ref 6–23)
CO2: 18 meq/L — ABNORMAL LOW (ref 19–32)
Calcium: 9.9 mg/dL (ref 8.4–10.5)
Chloride: 100 meq/L (ref 96–112)
Creatinine, Ser: 1.14 mg/dL (ref 0.40–1.20)
GFR: 47.33 mL/min — ABNORMAL LOW (ref >60.00)
Glucose, Bld: 133 mg/dL — ABNORMAL HIGH (ref 70–99)
Potassium: 4.5 meq/L (ref 3.5–5.1)
Sodium: 133 meq/L — ABNORMAL LOW (ref 135–145)

## 2023-05-06 LAB — URINALYSIS, ROUTINE W REFLEX MICROSCOPIC
Bilirubin Urine: NEGATIVE
Ketones, ur: NEGATIVE
Nitrite: POSITIVE — AB
Specific Gravity, Urine: 1.02 (ref 1.000–1.030)
Total Protein, Urine: 30 — AB
Urine Glucose: NEGATIVE
Urobilinogen, UA: 0.2 (ref 0.0–1.0)
pH: 6 (ref 5.0–8.0)

## 2023-05-08 ENCOUNTER — Encounter: Payer: Self-pay | Admitting: Family Medicine

## 2023-05-08 ENCOUNTER — Ambulatory Visit: Admitting: Family Medicine

## 2023-05-08 VITALS — BP 112/60 | HR 75 | Temp 98.1°F | Ht 67.25 in | Wt 207.0 lb

## 2023-05-08 DIAGNOSIS — N3000 Acute cystitis without hematuria: Secondary | ICD-10-CM

## 2023-05-08 DIAGNOSIS — R944 Abnormal results of kidney function studies: Secondary | ICD-10-CM | POA: Diagnosis not present

## 2023-05-08 MED ORDER — CEPHALEXIN 500 MG PO CAPS: 500.0000 mg | ORAL_CAPSULE | Freq: Two times a day (BID) | ORAL | 0 refills | Status: DC

## 2023-05-08 NOTE — Assessment & Plan Note (Signed)
 After last visit cr decreased to 38.6 with K of 5.3 and then 5.6  Re check now improved with GFR of 47.33 and K normal at 4.5  She has evidence of uti on urinalysis and culture is pending  Some protein and ca ox crystals but no  Reviewed old Ct chest -did note scarring left kidney   Will treat uti with keflex  Culture pending  Encouraged fluids/ doing better   A nephrology referral was done but not set up yet   Cautioned to avoid nsaids or new over the counter meds or vitamins

## 2023-05-08 NOTE — Assessment & Plan Note (Signed)
 May be reason for dec GFR Urinalysis reviewed  Culture pending  Started on keflex 500 mg bid  Some mild symptoms   Will adj therapy if needed  Continue to monitor renal function  Encouraged to continue good water intake   Call back and Er precautions noted in detail today    Of note last uti dec with e coli/pan sensitive

## 2023-05-08 NOTE — Progress Notes (Signed)
 Subjective:    Patient ID: Diana Cook, female    DOB: 30-Jul-1950, 73 y.o.   MRN: [id number]  HPI  Wt Readings from Last 3 Encounters:  05/08/23 207 lb (93.9 kg)  03/25/23 205 lb 2 oz (93 kg)  03/06/23 206 lb (93.4 kg)   32.18 kg/m  Vitals:   05/08/23 1109  BP: 112/60  Pulse: 75  Temp: 98.1 F (36.7 C)  SpO2: 93%    Pt presents for follow up of change in renal labs/decreased GFR   After last visit-labs returned with elevated K and decreased GFR  K was 5.3 , then 5.6 then recently back to normal at 4.5  GFR went down to 38.6 and then back up to 47.33  Nephrology referral was done   Had some epidural injections in spine early this week  Gave her some relief    Right knee bothers her more  Needs follow up with Dr Patsy Lager -has been aspirated in past   We then did urinalysis - possible uti  Some urinary symptoms now  A little burning to urinate More incontinent  Urgency  No blood  Cloudy    Results for orders placed or performed in visit on 05/05/23  Urinalysis, Routine w reflex microscopic   Collection Time: 05/05/23  2:10 PM  Result Value Ref Range   Color, Urine YELLOW Yellow;Lt. Yellow;Straw;Dark Yellow;Amber;Green;Red;Brown   APPearance Sl Cloudy (A) Clear;Turbid;Slightly Cloudy;Cloudy   Specific Gravity, Urine 1.020 1.000 - 1.030   pH 6.0 5.0 - 8.0   Total Protein, Urine 30 (A) Negative   Urine Glucose NEGATIVE Negative   Ketones, ur NEGATIVE Negative   Bilirubin Urine NEGATIVE Negative   Hgb urine dipstick TRACE-INTACT (A) Negative   Urobilinogen, UA 0.2 0.0 - 1.0   Leukocytes,Ua MODERATE (A) Negative   Nitrite POSITIVE (A) Negative   WBC, UA 21-50/hpf (A) 0-2/hpf   RBC / HPF 3-6/hpf (A) 0-2/hpf   Mucus, UA Presence of (A) None   Squamous Epithelial / HPF Rare(0-4/hpf) Rare(0-4/hpf)   Renal Epithel, UA Rare(0-4/hpf) (A) None   Bacteria, UA Many(>50/hpf) (A) None   Ca Oxalate Crys, UA Presence of (A) None  Basic metabolic panel   Collection  Time: 05/05/23  2:10 PM  Result Value Ref Range   Sodium 133 (L) 135 - 145 mEq/L   Potassium 4.5 3.5 - 5.1 mEq/L   Chloride 100 96 - 112 mEq/L   CO2 18 (L) 19 - 32 mEq/L   Glucose, Bld 133 (H) 70 - 99 mg/dL   BUN 30 (H) 6 - 23 mg/dL   Creatinine, Ser 1.47 0.40 - 1.20 mg/dL   GFR 82.95 (L) >62.13 mL/min   Calcium 9.9 8.4 - 10.5 mg/dL     Urine -some bacteria and also some ca ox crystals   Last urine culture was dec   e coli pan sens   On CT of chest 10/2022 -noted incidental scarring of left kidney   She does take protonix 40 mg daily   Lasix is seldom   No fluid restriction   Patient Active Problem List   Diagnosis Date Noted   Decreased GFR 04/29/2023   Presence of heart assist device (HCC) 03/25/2023   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/25/2023   Persistent cough 03/03/2023   Urinary urgency 02/09/2023   Carotid stenosis, symptomatic w/o infarct, right 04/29/2022   Carotid stenosis, symptomatic w/o infarct 03/23/2022   Right shoulder pain 11/25/2021   Mild anemia 11/03/2021  Thyroid nodule 09/17/2021   Dyslipidemia 09/05/2021   Depression 09/05/2021   GERD without esophagitis 09/05/2021   Vitamin D deficiency 08/20/2021   Colon cancer screening 07/30/2021   Hypercalcemia 07/29/2021   UTI (urinary tract infection) 07/17/2021   Dysuria 07/08/2021   Arterial hypotension 07/08/2021   Paroxysmal atrial fibrillation (HCC) 04/11/2021   Current use of proton pump inhibitor 10/24/2020   Sleep apnea 10/24/2020   Fatigue 10/24/2020   Radicular pain in left arm 03/02/2020   Neck pain on left side 02/15/2020   Paresthesia 02/15/2020   Chronic respiratory failure with hypoxia (HCC) 11/17/2019   Chronic heart failure with preserved ejection fraction (HFpEF) (HCC) 08/11/2019   Coronary artery disease involving native coronary artery of native heart without angina pectoris 06/20/2019   Gastroesophageal reflux disease    Stomach irritation    Abnormal CT scan,  esophagus    Polyp of ascending colon    Personal history of tobacco use, presenting hazards to health 01/18/2018   Grade I diastolic dysfunction 12/21/2017   Globus pharyngeus 12/21/2017   Chronic obstructive pulmonary disease (HCC) 12/15/2017   Hyperlipidemia 12/07/2017   Seizures (HCC) 10/01/2016   Elevated glucose 08/31/2016   Grief reaction 04/11/2016   History of breast cancer 12/11/2015   Osteoporosis 11/22/2015   Estrogen deficiency 09/07/2015   Wrinkles 09/07/2015   Post herpetic neuralgia 09/01/2014   Encounter for Medicare annual wellness exam 07/11/2014   Routine general medical examination at a health care facility 06/26/2013   H/O small bowel obstruction 04/15/2013   SOB (shortness of breath) 02/10/2013   Complete heart block (HCC) 11/03/2011   Pacemaker-[employer] 11/03/2011   Lumbar spinal stenosis 09/02/2011   POSTMENOPAUSAL STATUS 08/06/2009   HIDRADENITIS SUPPURATIVA 06/26/2008   HERNIATED DISC 12/14/2007   BACK, LOWER, PAIN 12/02/2007   Hyperkalemia 08/06/2007   History of colonic polyps 09/14/2006   Former smoker 06/10/2006   Asthma 06/10/2006   H/O idiopathic seizure 06/10/2006   Past Medical History:  Diagnosis Date   Allergic rhinitis    Arthritis    Asthma    as a child, mild now   Breast cancer (HCC) 12/2015   right breast cancer, lumpectomy and mammosite    Colon polyps    colonoscopy 7/08, tubular adenoma   Complete heart block (HCC) 09/2011   s/p PPM implanted in [facility]   COPD (chronic obstructive pulmonary disease) (HCC)    Myocardial infarction (HCC) 2011   Osteopenia 10/2015   Pacemaker    2011   Paroxysmal atrial fibrillation (HCC) 03/2021   Incidentally detected on pacemaker interrogation   Personal history of radiation therapy 2017   right breast ca, mammosite placed   Rotator cuff tear, right 02/2022   Had an MRI in [place] "I have 2 tears."   Seizure disorder (HCC)    Seizures (HCC)    first one was when she was  73 years old    Small bowel obstruction (HCC)    1988 and 2002   Tobacco abuse    Past Surgical History:  Procedure Laterality Date   ABDOMINAL HYSTERECTOMY     BREAST BIOPSY Right 2007   benign inflammatory changes, mass due to underwire bra   BREAST BIOPSY Left 01/02/2016   columnar cell changes without atypical hyperplasia.   BREAST BIOPSY Right 12/06/2015   rt breast mass 10:00, bx done at Dr. Arvella Nigh office, invasive ductal carcinoma   BREAST EXCISIONAL BIOPSY Left 01/02/2016   COLUMNAR CELL CHANGE AND HYPERPLASIA ASSOCIATED WITH  LUMINAL AND STROMAL CALCIFICATIONS   BREAST LUMPECTOMY Right 01/02/2016   invasive mammary carcinoma, clear margins, negative LN   BREAST LUMPECTOMY WITH SENTINEL LYMPH NODE BIOPSY Right 01/02/2016   pT1c, N0; ER/ PR 100%; Her 2 neu not over expressed: BREAST LUMPECTOMY WITH SENTINEL LYMPH NODE BX;  Surgeon: Earline Mayotte, MD;  Location: [facility] ORS;  Service: General;  Laterality: Right;   CARDIAC CATHETERIZATION     CAROTID PTA/STENT INTERVENTION Right 04/29/2022   Procedure: CAROTID PTA/STENT INTERVENTION;  Surgeon: Renford Dills, MD;  Location: [facility] INVASIVE CV LAB;  Service: Cardiovascular;  Laterality: Right;   CATARACT EXTRACTION Bilateral    COLONOSCOPY  10/2015   Dr Christella Hartigan   COLONOSCOPY WITH PROPOFOL N/A 08/19/2018   Procedure: COLONOSCOPY WITH PROPOFOL;  Surgeon: Pasty Spillers, MD;  Location: [facility] ENDOS[facility];  Service: Endoscopy;  Laterality: N/A;   ESOPHAGOGASTRODUODENOSCOPY (EGD) WITH PROPOFOL N/A 08/19/2018   Procedure: ESOPHAGOGASTRODUODENOSCOPY (EGD) WITH PROPOFOL;  Surgeon: Pasty Spillers, MD;  Location: [facility] ENDOSCOPY;  Service: Endoscopy;  Laterality: N/A;   EXPLORATORY LAPAROTOMY  01/25/2001   Exploratory laparotomy, lysis of adhesions, identification of internal hernia secondary to omental adhesion. Prolonged postoperative ileus.   gyn surgery  1993   hysterectomy- form endometriosis   LAPAROSCOPY     PACEMAKER  INSERTION  10/24/11   [employer] Advantio dual chamber PPM implanted by Dr Venda Rodes at [facility] in [place]   PPM GENERATOR CHANGEOUT N/A 12/05/2019   Procedure: PPM GENERATOR Janeann Merl;  Surgeon: Duke Salvia, MD;  Location: [facility] INVASIVE CV LAB;  Service: Cardiovascular;  Laterality: N/A;   RIGHT/LEFT HEART CATH AND CORONARY ANGIOGRAPHY Bilateral 07/19/2019   Procedure: RIGHT/LEFT HEART CATH AND CORONARY ANGIOGRAPHY;  Surgeon: Yvonne Kendall, MD;  Location: [facility] INVASIVE CV LAB;  Service: Cardiovascular;  Laterality: Bilateral;   TEMPORARY PACEMAKER N/A 12/05/2019   Procedure: TEMPORARY PACEMAKER;  Surgeon: Duke Salvia, MD;  Location: [facility] INVASIVE CV LAB;  Service: Cardiovascular;  Laterality: N/A;   Social History   Tobacco Use   Smoking status: Former    Current packs/day: 0.00    Average packs/day: 1.5 packs/day for 42.0 years (63.0 ttl pk-yrs)    Types: Cigarettes    Start date: 11/10/1968    Quit date: 11/11/2010    Years since quitting: 12.4   Smokeless tobacco: Never  Vaping Use   Vaping status: Never Used  Substance Use Topics   Alcohol use: Yes    Alcohol/week: 1.0 - 2.0 standard drink of alcohol    Types: 1 - 2 Glasses of wine per week    Comment: weekly   Drug use: No   Family History  Problem Relation Age of Onset   Stroke Mother    Heart disease Mother 35       MI and CABG   Dementia Mother    Coronary artery disease Father    Parkinsonism Father    Breast cancer Sister    Cancer - Cervical Daughter 77       died 2023-04-07   Heart attack Brother 64   Colon cancer Neg Hx    Allergies  Allergen Reactions   Codeine Nausea And Vomiting   Morphine And Codeine Nausea Only   Current Outpatient Medications on File Prior to Visit  Medication Sig Dispense Refill   acetaminophen (TYLENOL) 500 MG tablet Take 500-1,000 mg by mouth every 6 (six) hours as needed for mild pain or headache.      albuterol (VENTOLIN HFA) 108 (  90 Base) MCG/ACT  inhaler Inhale 2 puffs into the lungs every 4 (four) hours as needed for wheezing or shortness of breath. 18 g 1   apixaban (ELIQUIS) 5 MG TABS tablet Take 1 tablet by mouth twice daily 180 tablet 1   aspirin EC 81 MG tablet Take 1 tablet (81 mg total) by mouth daily. 150 tablet 2   atorvastatin (LIPITOR) 40 MG tablet Take 1 tablet by mouth once daily 90 tablet 2   benzonatate (TESSALON) 200 MG capsule Take 1 capsule (200 mg total) by mouth 2 (two) times daily as needed for cough. 20 capsule 0   Cholecalciferol (VITAMIN D3) 50 MCG (2000 UT) capsule Take 2,000 Units by mouth daily.     FLUoxetine (PROZAC) 20 MG capsule Take 1 capsule by mouth once daily 90 capsule 2   furosemide (LASIX) 40 MG tablet Take 1 tablet (40 mg total) by mouth daily as needed (swelling, wt gain or needing more oxygen). 90 tablet 0   gabapentin (NEURONTIN) 100 MG capsule Take 100 mg by mouth daily as needed.     levETIRAcetam (KEPPRA) 250 MG tablet Take 250 mg by mouth 2 (two) times daily.      OXYGEN Inhale 4 L into the lungs continuous.     pantoprazole (PROTONIX) 40 MG tablet Take 1 tablet (40 mg total) by mouth daily. 90 tablet 3   STIOLTO RESPIMAT 2.5-2.5 MCG/ACT AERS INHALE 2 PUFFS BY MOUTH ONCE DAILY 12 g 0   zolpidem (AMBIEN) 10 MG tablet TAKE 1/2 TO 1 (ONE-HALF TO ONE) TABLET BY MOUTH AT BEDTIME AS NEEDED FOR SLEEP 30 tablet 3   No current facility-administered medications on file prior to visit.    Review of Systems  Constitutional:  Negative for activity change, appetite change, fatigue, fever and unexpected weight change.  HENT:  Negative for congestion, ear pain, rhinorrhea, sinus pressure and sore throat.   Eyes:  Negative for pain, redness and visual disturbance.  Respiratory:  Negative for cough, shortness of breath and wheezing.        Wearing 02 baseline   Cardiovascular:  Negative for chest pain and palpitations.  Gastrointestinal:  Negative for abdominal pain, blood in stool, constipation and  diarrhea.  Endocrine: Negative for polydipsia and polyuria.  Genitourinary:  Positive for dysuria, frequency and urgency. Negative for pelvic pain.  Musculoskeletal:  Positive for arthralgias and back pain. Negative for myalgias.       Back pain improved with injections  No flank pain    Right knee pain and swelling   Skin:  Negative for pallor and rash.  Allergic/Immunologic: Negative for environmental allergies.  Neurological:  Negative for dizziness, syncope and headaches.  Hematological:  Negative for adenopathy. Does not bruise/bleed easily.  Psychiatric/Behavioral:  Negative for decreased concentration and dysphoric mood. The patient is not nervous/anxious.        Objective:   Physical Exam Constitutional:      General: She is not in acute distress.    Appearance: Normal appearance. She is well-developed. She is obese. She is not ill-appearing or diaphoretic.  HENT:     Head: Normocephalic and atraumatic.  Eyes:     Conjunctiva/sclera: Conjunctivae normal.     Pupils: Pupils are equal, round, and reactive to light.  Neck:     Thyroid: No thyromegaly.     Vascular: No carotid bruit or JVD.  Cardiovascular:     Rate and Rhythm: Normal rate and regular rhythm.     Heart sounds: Normal  heart sounds.     No gallop.  Pulmonary:     Effort: Pulmonary effort is normal. No respiratory distress.     Breath sounds: Normal breath sounds. No wheezing or rales.     Comments: Diffusely distant bs  Abdominal:     General: There is no distension or abdominal bruit.     Palpations: Abdomen is soft.     Comments: No suprapubic tenderness or fullness  No cva tenderness   Musculoskeletal:     Cervical back: Normal range of motion and neck supple.     Right lower leg: No edema.     Left lower leg: No edema.     Comments: Right knee is swollen with limited flexion   Lymphadenopathy:     Cervical: No cervical adenopathy.  Skin:    General: Skin is warm and dry.     Coloration:  Skin is not pale.     Findings: No rash.  Neurological:     Mental Status: She is alert.     Cranial Nerves: No cranial nerve deficit.     Motor: No weakness.     Coordination: Coordination normal.     Deep Tendon Reflexes: Reflexes are normal and symmetric. Reflexes normal.  Psychiatric:        Mood and Affect: Mood normal.           Assessment & Plan:   Problem List Items Addressed This Visit       Genitourinary   UTI (urinary tract infection)   May be reason for dec GFR Urinalysis reviewed  Culture pending  Started on keflex 500 mg bid  Some mild symptoms   Will adj therapy if needed  Continue to monitor renal function  Encouraged to continue good water intake   Call back and Er precautions noted in detail today    Of note last uti dec with e coli/pan sensitive       Relevant Medications   cephALEXin (KEFLEX) 500 MG capsule   Other Relevant Orders   Urine Culture     Other   Decreased GFR - Primary   After last visit cr decreased to 38.6 with K of 5.3 and then 5.6  Re check now improved with GFR of 47.33 and K normal at 4.5  She has evidence of uti on urinalysis and culture is pending  Some protein and ca ox crystals but no  Reviewed old Ct chest -did note scarring left kidney   Will treat uti with keflex  Culture pending  Encouraged fluids/ doing better   A nephrology referral was done but not set up yet   Cautioned to avoid nsaids or new over the counter meds or vitamins           Relevant Orders   Urine Culture

## 2023-05-08 NOTE — Patient Instructions (Addendum)
 Use cold compress knee as needed for 10 minutes at a time Make appointment with Dr Patsy Lager on way out   Kidney numbers did improve a bit Stay hydrated Take keflex for uti  We will contact you with about the urine culture   If your urinary symptoms worsen over the weekend, call our number      For healthy weight  Try to get most of your carbohydrates from produce (with the exception of white potatoes) and whole grains Eat less bread/pasta/rice/snack foods/cereals/sweets and other items from the middle of the grocery store (processed carbs) Try and stay as active as you can be safely (strength training is most helpful)

## 2023-05-11 ENCOUNTER — Telehealth: Payer: Self-pay | Admitting: Family Medicine

## 2023-05-11 LAB — URINE CULTURE
MICRO NUMBER:: 16203710
SPECIMEN QUALITY:: ADEQUATE

## 2023-05-11 MED ORDER — NITROFURANTOIN MONOHYD MACRO 100 MG PO CAPS: 100.0000 mg | ORAL_CAPSULE | Freq: Two times a day (BID) | ORAL | 0 refills | Status: DC

## 2023-05-11 NOTE — Telephone Encounter (Signed)
 Pt notified of results and Dr. Royden Purl comments, pt will stop keflex and start new Abx

## 2023-05-11 NOTE — Telephone Encounter (Signed)
 Uti is unfortunately not sensitive to keflex   I sent in macrobid to take bid for 5 days   (5 days in light of kidney function)  Let us know if any problems with that

## 2023-05-12 NOTE — Progress Notes (Unsigned)
   Kaleem Sartwell T. Jadriel Saxer, MD, CAQ Sports Medicine [facility] [address] [place], [address]  Phone: [phone]  FAX: [phone]  Diana Cook - 73 y.o. female  MRN [id number]  Date of Birth: October 10, 1950  Date: 05/13/2023  PCP: Judy Pimple, MD  Referral: Judy Pimple, MD  No chief complaint on file.  Subjective:   Diana Cook is a 73 y.o. very pleasant female patient with There is no height or weight on file to calculate BMI. who presents with the following:  He is a very pleasant patient presents with some ongoing knee pain.  I have seen her previously for both right and left-sided knee osteoarthritis.  I last saw her in May 2024 fo right-sided knee osteoarthritis.  Last time I saw her, she did have advanced knee osteoarthritis in the medial compartment with overall moderate arthritis.  At that point, I did do an intra-articular injection of the right knee.  Review of Systems is noted in the HPI, as appropriate  Objective:   There were no vitals taken for this visit.  GEN: No acute distress; alert,appropriate. PULM: Breathing comfortably in no respiratory distress PSYCH: Normally interactive.   Laboratory and Imaging Data:  Assessment and Plan:   ***

## 2023-05-13 ENCOUNTER — Ambulatory Visit (INDEPENDENT_AMBULATORY_CARE_PROVIDER_SITE_OTHER): Admitting: Family Medicine

## 2023-05-13 VITALS — BP 120/70 | HR 69 | Temp 97.7°F | Ht 67.25 in | Wt 209.5 lb

## 2023-05-13 DIAGNOSIS — M1711 Unilateral primary osteoarthritis, right knee: Secondary | ICD-10-CM

## 2023-05-13 DIAGNOSIS — M170 Bilateral primary osteoarthritis of knee: Secondary | ICD-10-CM | POA: Diagnosis not present

## 2023-05-13 MED ORDER — TRIAMCINOLONE ACETONIDE 40 MG/ML IJ SUSP
40.0000 mg | Freq: Once | INTRAMUSCULAR | Status: AC
  Administered 2023-05-13: 40 mg via INTRA_ARTICULAR

## 2023-05-18 ENCOUNTER — Encounter: Payer: Self-pay | Admitting: *Deleted

## 2023-05-25 ENCOUNTER — Other Ambulatory Visit: Payer: Self-pay | Admitting: Pulmonary Disease

## 2023-05-31 NOTE — Progress Notes (Unsigned)
 MRN : [id number]  Diana Cook is a 73 y.o. (1950-04-03) female who presents with chief complaint of check carotid arteries.  History of Present Illness:  The patient is seen for follow up evaluation of carotid stenosis status post right carotid stent on 04/29/2022.     Procedure:  Placement of a 9 x 7 x 40 exact stent with the use of the NAV-6 embolic protection device in the right internal carotid artery   There were no post operative problems or complications related to the surgery.  The patient denies neck or incisional pain.   The patient denies interval amaurosis fugax. There is no recent history of TIA symptoms or focal motor deficits. There is no prior documented CVA.   The patient denies headache.   The patient is taking enteric-coated aspirin 81 mg daily.   No recent shortening of the patient's walking distance or new symptoms consistent with claudication.  No history of rest pain symptoms. No new ulcers or wounds of the lower extremities have occurred.   There is no history of DVT, PE or superficial thrombophlebitis. No recent episodes of angina or shortness of breath documented.    Duplex ultrasound of the carotid arteries obtained today demonstrates 1 to 39% stenosis bilaterally with antegrade vertebral artery flow.  No significant change compared to the previous study  No outpatient medications have been marked as taking for the 06/01/23 encounter (Appointment) with Gilda Crease, Latina Craver, MD.    Past Medical History:  Diagnosis Date   Allergic rhinitis    Arthritis    Asthma    as a child, mild now   Breast cancer (HCC) 12/2015   right breast cancer, lumpectomy and mammosite    Colon polyps    colonoscopy 7/08, tubular adenoma   Complete heart block (HCC) 09/2011   s/p PPM [facility]   COPD (chronic obstructive pulmonary disease) (HCC)    Myocardial infarction (HCC) 2011   Osteopenia 10/2015   Pacemaker    2011    Paroxysmal atrial fibrillation (HCC) 03/2021   Incidentally detected on pacemaker interrogation   Personal history of radiation therapy 2017   right breast ca, mammosite placed   Rotator cuff tear, right 02/2022   Had an MRI in [place] "I have 2 tears."   Seizure disorder (HCC)    Seizures (HCC)    first one was when she was 73 years old    Small bowel obstruction (HCC)    1988 and 2002   Tobacco abuse     Past Surgical History:  Procedure Laterality Date   ABDOMINAL HYSTERECTOMY     BREAST BIOPSY Right 2007   benign inflammatory changes, mass due to underwire bra   BREAST BIOPSY Left 01/02/2016   columnar cell changes without atypical hyperplasia.   BREAST BIOPSY Right 12/06/2015   rt breast mass 10:00, bx done at Dr. Arvella Nigh office, invasive ductal carcinoma   BREAST EXCISIONAL BIOPSY Left 01/02/2016   COLUMNAR CELL CHANGE AND HYPERPLASIA ASSOCIATED WITH LUMINAL AND STROMAL CALCIFICATIONS   BREAST LUMPECTOMY Right 01/02/2016   invasive mammary carcinoma, clear margins, negative LN   BREAST LUMPECTOMY WITH SENTINEL LYMPH NODE BIOPSY Right 01/02/2016   pT1c, N0; ER/ PR 100%; Her 2 neu not over expressed: BREAST LUMPECTOMY WITH SENTINEL LYMPH NODE BX;  Surgeon: Earline Mayotte, MD;  Location: [facility] ORS;  Service: General;  Laterality: Right;   CARDIAC CATHETERIZATION     CAROTID PTA/STENT INTERVENTION Right 04/29/2022   Procedure: CAROTID PTA/STENT INTERVENTION;  Surgeon: Renford Dills, MD;  Location: [facility] INVASIVE CV LAB;  Service: Cardiovascular;  Laterality: Right;   CATARACT EXTRACTION Bilateral    COLONOSCOPY  10/2015   Dr Christella Hartigan   COLONOSCOPY WITH PROPOFOL N/A 08/19/2018   Procedure: COLONOSCOPY WITH PROPOFOL;  Surgeon: Pasty Spillers, MD;  Location: [facility] ENDOSCOPY;  Service: Endoscopy;  Laterality: N/A;   ESOPHAGOGASTRODUODENOSCOPY (EGD) WITH PROPOFOL N/A 08/19/2018   Procedure: ESOPHAGOGASTRODUODENOSCOPY (EGD) WITH PROPOFOL;  Surgeon: Pasty Spillers, MD;   Location: [facility] ENDOSCOPY;  Service: Endoscopy;  Laterality: N/A;   EXPLORATORY LAPAROTOMY  01/25/2001   Exploratory laparotomy, lysis of adhesions, identification of internal hernia secondary to omental adhesion. Prolonged postoperative ileus.   gyn surgery  1993   hysterectomy- form endometriosis   LAPAROSCOPY     PACEMAKER INSERTION  10/24/11   [employer] Advantio dual chamber PPM implanted by Dr Venda Rodes at [facility] in [place]   PPM GENERATOR CHANGEOUT N/A 12/05/2019   Procedure: PPM GENERATOR Janeann Merl;  Surgeon: Duke Salvia, MD;  Location: [facility] INVASIVE CV LAB;  Service: Cardiovascular;  Laterality: N/A;   RIGHT/LEFT HEART CATH AND CORONARY ANGIOGRAPHY Bilateral 07/19/2019   Procedure: RIGHT/LEFT HEART CATH AND CORONARY ANGIOGRAPHY;  Surgeon: Yvonne Kendall, MD;  Location: [facility] INVASIVE CV LAB;  Service: Cardiovascular;  Laterality: Bilateral;   TEMPORARY PACEMAKER N/A 12/05/2019   Procedure: TEMPORARY PACEMAKER;  Surgeon: Duke Salvia, MD;  Location: [facility] INVASIVE CV LAB;  Service: Cardiovascular;  Laterality: N/A;    Social History Social History   Tobacco Use   Smoking status: Former    Current packs/day: 0.00    Average packs/day: 1.5 packs/day for 42.0 years (63.0 ttl pk-yrs)    Types: Cigarettes    Start date: 11/10/1968    Quit date: 11/11/2010    Years since quitting: 12.5   Smokeless tobacco: Never  Vaping Use   Vaping status: Never Used  Substance Use Topics   Alcohol use: Yes    Alcohol/week: 1.0 - 2.0 standard drink of alcohol    Types: 1 - 2 Glasses of wine per week    Comment: weekly   Drug use: No    Family History Family History  Problem Relation Age of Onset   Stroke Mother    Heart disease Mother 52       MI and CABG   Dementia Mother    Coronary artery disease Father    Parkinsonism Father    Breast cancer Sister    Cancer - Cervical Daughter 59       died 2023-03-20   Heart attack Brother 46   Colon cancer Neg Hx      Allergies  Allergen Reactions   Codeine Nausea And Vomiting   Morphine And Codeine Nausea Only     REVIEW OF SYSTEMS (Negative unless checked)  Constitutional: [] Weight loss  [] Fever  [] Chills Cardiac: [] Chest pain   [] Chest pressure   [] Palpitations   [] Shortness of breath when laying flat   [] Shortness of breath with exertion. Vascular:  [x] Pain in legs with walking   [] Pain in legs at rest  [] History of DVT   [] Phlebitis   [] Swelling in legs   [] Varicose veins   [] Non-healing ulcers Pulmonary:   [] Uses home oxygen   [] Productive cough   [] Hemoptysis   [] Wheeze  [x] COPD   [x] Asthma Neurologic:  []   Dizziness   [] Seizures   [] History of stroke   [] History of TIA  [] Aphasia   [] Vissual changes   [] Weakness or numbness in arm   [] Weakness or numbness in leg Musculoskeletal:   [] Joint swelling   [x] Joint pain   [] Low back pain Hematologic:  [] Easy bruising  [] Easy bleeding   [] Hypercoagulable state   [] Anemic Gastrointestinal:  [] Diarrhea   [] Vomiting  [x] Gastroesophageal reflux/heartburn   [] Difficulty swallowing. Genitourinary:  [] Chronic kidney disease   [] Difficult urination  [] Frequent urination   [] Blood in urine Skin:  [] Rashes   [] Ulcers  Psychological:  [] History of anxiety   []  History of major depression.  Physical Examination  There were no vitals filed for this visit. There is no height or weight on file to calculate BMI. Gen: WD/WN, NAD Head: Mabel/AT, No temporalis wasting.  Ear/Nose/Throat: Hearing grossly intact, nares w/o erythema or drainage Eyes: PER, EOMI, sclera nonicteric.  Neck: Supple, no masses.  No bruit or JVD.  Pulmonary:  Good air movement, no audible wheezing, no use of accessory muscles.  Cardiac: RRR, normal S1, S2, no Murmurs. Vascular:  carotid bruit noted Vessel Right Left  Radial Palpable Palpable  Carotid  Palpable  Palpable  Subclav  Palpable Palpable  Gastrointestinal: soft, non-distended. No guarding/no peritoneal signs.  Musculoskeletal:  M/S 5/5 throughout.  No visible deformity.  Neurologic: CN 2-12 intact. Pain and light touch intact in extremities.  Symmetrical.  Speech is fluent. Motor exam as listed above. Psychiatric: Judgment intact, Mood & affect appropriate for pt's clinical situation. Dermatologic: No rashes or ulcers noted.  No changes consistent with cellulitis.   CBC Lab Results  Component Value Date   WBC 7.8 03/18/2023   HGB 12.8 03/18/2023   HCT 39.2 03/18/2023   MCV 90.2 03/18/2023   PLT 284.0 03/18/2023    BMET    Component Value Date/Time   NA 133 (L) 05/05/2023 1410   NA 139 11/16/2019 1433   NA 136 07/21/2013 1517   K 4.5 05/05/2023 1410   K 4.2 07/21/2013 1517   CL 100 05/05/2023 1410   CL 105 07/21/2013 1517   CO2 18 (L) 05/05/2023 1410   CO2 22 07/21/2013 1517   GLUCOSE 133 (H) 05/05/2023 1410   GLUCOSE 131 (H) 07/21/2013 1517   BUN 30 (H) 05/05/2023 1410   BUN 28 (H) 11/16/2019 1433   BUN 19 (H) 07/21/2013 1517   CREATININE 1.14 05/05/2023 1410   CREATININE 0.94 07/21/2013 1517   CALCIUM 9.9 05/05/2023 1410   CALCIUM 9.4 07/21/2013 1517   GFRNONAA >60 04/30/2022 0404   GFRNONAA >60 07/21/2013 1517   GFRAA 60 11/16/2019 1433   GFRAA >60 07/21/2013 1517   CrCl cannot be calculated (Patient's most recent lab result is older than the maximum 21 days allowed.).  COAG Lab Results  Component Value Date   INR 1.5 (H) 09/05/2021   INR 1.4 (H) 09/04/2021   INR 1.0 04/11/2021    Radiology No results found.   Assessment/Plan There are no diagnoses linked to this encounter.   Levora Dredge, MD  05/31/2023 4:07 PM

## 2023-06-01 ENCOUNTER — Ambulatory Visit (INDEPENDENT_AMBULATORY_CARE_PROVIDER_SITE_OTHER): Payer: Medicare Other

## 2023-06-01 ENCOUNTER — Encounter (INDEPENDENT_AMBULATORY_CARE_PROVIDER_SITE_OTHER): Payer: Self-pay | Admitting: Vascular Surgery

## 2023-06-01 ENCOUNTER — Ambulatory Visit (INDEPENDENT_AMBULATORY_CARE_PROVIDER_SITE_OTHER): Payer: Medicare Other | Admitting: Vascular Surgery

## 2023-06-01 VITALS — BP 88/55 | HR 69 | Resp 16 | Ht 69.0 in | Wt 208.6 lb

## 2023-06-01 DIAGNOSIS — I6523 Occlusion and stenosis of bilateral carotid arteries: Secondary | ICD-10-CM

## 2023-06-01 DIAGNOSIS — I480 Paroxysmal atrial fibrillation: Secondary | ICD-10-CM

## 2023-06-01 DIAGNOSIS — I6521 Occlusion and stenosis of right carotid artery: Secondary | ICD-10-CM | POA: Diagnosis not present

## 2023-06-01 DIAGNOSIS — I2510 Atherosclerotic heart disease of native coronary artery without angina pectoris: Secondary | ICD-10-CM | POA: Diagnosis not present

## 2023-06-01 DIAGNOSIS — J449 Chronic obstructive pulmonary disease, unspecified: Secondary | ICD-10-CM

## 2023-06-01 DIAGNOSIS — E782 Mixed hyperlipidemia: Secondary | ICD-10-CM

## 2023-06-02 ENCOUNTER — Other Ambulatory Visit (HOSPITAL_COMMUNITY): Payer: Self-pay

## 2023-06-02 ENCOUNTER — Encounter: Payer: Self-pay | Admitting: Internal Medicine

## 2023-06-02 ENCOUNTER — Telehealth: Payer: Self-pay

## 2023-06-02 NOTE — Telephone Encounter (Addendum)
 Pharmacy Patient Advocate Encounter   Received notification from Physician's Office that TEST CLAIM for ELIQUIS is required/requested.   Insurance verification completed.   The patient is insured through [employer] .   Per test claim: The current 30 day co-pay is, $147.52; 90 day copay is $442.18 (pt likely still has deductible to meet).  No PA needed at this time. This test claim was processed through [facility]- copay amounts may vary at other pharmacies due to pharmacy/plan contracts, or as the patient moves through the different stages of their insurance plan.   Xarelto 20 mg for 30 days is $142.52 Pradaxa for 30 days is $48.40 (needs a daw code to run as brand)

## 2023-06-04 ENCOUNTER — Other Ambulatory Visit: Payer: Self-pay | Admitting: *Deleted

## 2023-06-04 ENCOUNTER — Ambulatory Visit (INDEPENDENT_AMBULATORY_CARE_PROVIDER_SITE_OTHER): Payer: Medicare Other

## 2023-06-04 DIAGNOSIS — I442 Atrioventricular block, complete: Secondary | ICD-10-CM | POA: Diagnosis not present

## 2023-06-04 LAB — CUP PACEART REMOTE DEVICE CHECK
Battery Remaining Longevity: 132 mo
Battery Remaining Percentage: 100 %
Brady Statistic RA Percent Paced: 2 %
Brady Statistic RV Percent Paced: 100 %
Date Time Interrogation Session: 20250410011200
Implantable Lead Connection Status: 753985
Implantable Lead Connection Status: 753985
Implantable Lead Implant Date: 20130830
Implantable Lead Implant Date: 20130830
Implantable Lead Location: 753859
Implantable Lead Location: 753860
Implantable Lead Model: 4456
Implantable Lead Model: 4479
Implantable Lead Serial Number: 473325
Implantable Lead Serial Number: 523784
Implantable Pulse Generator Implant Date: 20211011
Lead Channel Impedance Value: 418 Ohm
Lead Channel Impedance Value: 616 Ohm
Lead Channel Pacing Threshold Amplitude: 0.5 V
Lead Channel Pacing Threshold Pulse Width: 0.4 ms
Lead Channel Setting Pacing Amplitude: 2 V
Lead Channel Setting Pacing Amplitude: 2 V
Lead Channel Setting Pacing Pulse Width: 0.4 ms
Lead Channel Setting Sensing Sensitivity: 2.5 mV
Pulse Gen Serial Number: 949303
Zone Setting Status: 755011

## 2023-06-04 MED ORDER — APIXABAN 5 MG PO TABS: 5.0000 mg | ORAL_TABLET | Freq: Two times a day (BID) | ORAL | 1 refills | Status: DC

## 2023-06-04 NOTE — Telephone Encounter (Signed)
 Prescription refill request for Eliquis received. Indication:afib  Last office visit: Diana Cook, 10/1/ 2024 Scr: 1.59, 06/02/2023 Age: 73 yo  Weight: 94.6 kg   Refill sent.

## 2023-06-06 ENCOUNTER — Encounter: Payer: Self-pay | Admitting: Internal Medicine

## 2023-06-08 ENCOUNTER — Ambulatory Visit: Admitting: Cardiology

## 2023-06-17 ENCOUNTER — Other Ambulatory Visit: Payer: Self-pay

## 2023-06-17 MED ORDER — APIXABAN 5 MG PO TABS: 5.0000 mg | ORAL_TABLET | Freq: Two times a day (BID) | ORAL | Status: DC

## 2023-06-17 NOTE — Telephone Encounter (Signed)
 Sample request for Eliquis  received. Indication: Afib  Last office visit: 11/25/22 Rodolfo Clan)  Scr: 1.59 (06/02/23)  Age: 73 Weight: 94.6kg  Eliquis  5mg  BID is appropriate dose. Will forward to triage for samples.

## 2023-06-23 ENCOUNTER — Other Ambulatory Visit: Payer: Self-pay | Admitting: Family Medicine

## 2023-06-23 NOTE — Telephone Encounter (Signed)
 Last OV: 05/08/23 Pending OV: Nothing scheduled at this time Medication: Zolpidem  10mg  Directions: take 1/2 to 1 tablet at bedtime prn Last Refill: 12/18/22 Qty: #30 with 3 refills

## 2023-06-24 ENCOUNTER — Ambulatory Visit
Admission: RE | Admit: 2023-06-24 | Discharge: 2023-06-24 | Disposition: A | Discharge status: Home / Self Care | Payer: Medicare Other | Source: Ambulatory Visit | Attending: Radiation Oncology | Admitting: Radiation Oncology

## 2023-06-24 DIAGNOSIS — R911 Solitary pulmonary nodule: Secondary | ICD-10-CM | POA: Diagnosis present

## 2023-06-24 MED ORDER — IOHEXOL 300 MG/ML  SOLN
60.0000 mL | Freq: Once | INTRAMUSCULAR | Status: AC | PRN
Start: 2023-06-24 — End: 2023-06-24
  Administered 2023-06-24: 60 mL via INTRAVENOUS

## 2023-07-02 ENCOUNTER — Encounter: Payer: Self-pay | Admitting: Radiation Oncology

## 2023-07-02 ENCOUNTER — Ambulatory Visit
Admission: RE | Admit: 2023-07-02 | Discharge: 2023-07-02 | Disposition: A | Discharge status: Home / Self Care | Payer: Medicare Other | Source: Ambulatory Visit | Attending: Radiation Oncology | Admitting: Radiation Oncology

## 2023-07-02 VITALS — HR 70 | Temp 97.4°F | Resp 18 | Ht 69.0 in | Wt 205.9 lb

## 2023-07-02 DIAGNOSIS — Z853 Personal history of malignant neoplasm of breast: Secondary | ICD-10-CM | POA: Insufficient documentation

## 2023-07-02 DIAGNOSIS — R911 Solitary pulmonary nodule: Secondary | ICD-10-CM

## 2023-07-02 DIAGNOSIS — Z85118 Personal history of other malignant neoplasm of bronchus and lung: Secondary | ICD-10-CM | POA: Diagnosis not present

## 2023-07-02 DIAGNOSIS — Z923 Personal history of irradiation: Secondary | ICD-10-CM | POA: Insufficient documentation

## 2023-07-02 NOTE — Progress Notes (Signed)
 Radiation Oncology Follow up Note  Name: Diana Cook   Date:   07/02/2023 MRN:  [id number] DOB: [birth year]    This 73 y.o. female presents to the clinic today for 67-month follow-up status post SBRT treatment of a stage I non-small cell lung cancer of the right lung previously treated to her right breast over 5 years prior.  REFERRING PROVIDER: Clemens Curt, MD  HPI: Is a 74 year old female now seen at 16 months having completed SBRT to her right lung for a stage I non-small cell lung cancer.  Seen today in routine follow-up she is doing well she is on continuous nasal oxygen .  Specifically [place] any change in her pulmonary status no cough hemoptysis or chest tightness.  She did have a recent CT scan.  That I have reviewed showed stable radiation changes in the right upper lobe.  There is an enlarging small peripheral left lower lobe pulmonary nodule worrisome for small growing neoplastic process.  COMPLICATIONS OF TREATMENT: none  FOLLOW UP COMPLIANCE: keeps appointments   PHYSICAL EXAM:  Pulse 70   Temp (!) 97.4 F (36.3 C) (Tympanic)   Resp 18   Ht 5\' 9"  (1.753 m)   Wt 205 lb 14.4 oz (93.4 kg)   SpO2 95%   BMI 30.41 kg/m patient is on nasal oxygen . Well-developed well-nourished patient in NAD. HEENT reveals PERLA, EOMI, discs not visualized.  Oral cavity is clear. No oral mucosal lesions are identified. Neck is clear without evidence of cervical or supraclavicular adenopathy. Lungs are clear to A&P. Cardiac examination is essentially unremarkable with regular rate and rhythm without murmur rub or thrill. Abdomen is benign with no organomegaly or masses noted. Motor sensory and DTR levels are equal and symmetric in the upper and lower extremities. Cranial nerves II through XII are grossly intact. Proprioception is intact. No peripheral adenopathy or edema is identified. No motor or sensory levels are noted. Crude visual fields are within normal range.  RADIOLOGY RESULTS: CT scan  reviewed PET CT scan ordered  PLAN: At this time I have ordered a PET CT scan.  Should this be a hypermetabolic nodule would offer SBRT treatment to this lesion.  Briefly risks and benefits of treatment which the patient is well aware of were again reviewed with the patient.  Will plan on 5 fractions of SBRT treatment should we elected to proceed with treatment.  Patient comprehends my recommendations well.  Will see her back 4 to 5 days after her PET scan for review.  Patient knows to call with any concerns.  I would like to take this opportunity to thank you for allowing me to participate in the care of your patient.Glenis Langdon, MD

## 2023-07-06 ENCOUNTER — Ambulatory Visit: Admission: RE | Admit: 2023-07-06 | Source: Ambulatory Visit

## 2023-07-08 NOTE — Telephone Encounter (Signed)
 Given Diana Cook's concerns about apixaban  and desire to explore alternative options, I recommend that she be seen in the office to discuss this in person.  Given that Dr. Rodolfo Clan is out, can she be scheduled to see Ottie Blonder in the next week or two to discuss this further?  Thanks.  Larinda Plover

## 2023-07-10 ENCOUNTER — Telehealth: Payer: Self-pay | Admitting: Emergency Medicine

## 2023-07-10 NOTE — Telephone Encounter (Signed)
 Called patient and notified her of the following from Dr. Nolan Battle.  The patient has a history of paroxysmal atrial fibrillation with a CHA2DS2-VASc score of at least 3; the standard of care is to continue therapeutic anticoagulation regardless of the presence of a carotid stent unless the risk of bleeding or other adverse outcomes outweighs the benefit of stroke prevention.  As previously recommended, I suggest that Diana Cook be seen in the office to discuss stroke prevention strategies further.  It may be best for her to see someone on the EP team, as she has followed in the past with Dr. Rodolfo Clan and could also learn more about nonpharmacologic stroke prevention options like left atrial appendage occlusion.  For now, I suggest she continue taking apixaban .   Sammy Crisp, MD [facility]   Patient verbalizes understanding.

## 2023-07-10 NOTE — Telephone Encounter (Signed)
 Patient reports that she had a carotid stent placed about a year ago. Patient recently had a follow up with Vascular Surgery about a month ago. She was told that from Vascular  she could stop Eliquis  and just take Asprin 81 MG daily. Patient states that Dr. Nolan Battle is the one who started her on Eliquis  and she would like to check with him to see if she can stop taking Eliquis .

## 2023-07-10 NOTE — Telephone Encounter (Signed)
-----   Message from Alica Antu sent at 07/09/2023  2:04 PM EDT ----- Regarding: med question Wants to asked about the medication of eliquis ?

## 2023-07-10 NOTE — Telephone Encounter (Signed)
 The patient has a history of paroxysmal atrial fibrillation with a CHA2DS2-VASc score of at least 3; the standard of care is to continue therapeutic anticoagulation regardless of the presence of a carotid stent unless the risk of bleeding or other adverse outcomes outweighs the benefit of stroke prevention.  As previously recommended, I suggest that Diana Cook be seen in the office to discuss stroke prevention strategies further.  It may be best for her to see someone on the EP team, as she has followed in the past with Dr. Rodolfo Clan and could also learn more about nonpharmacologic stroke prevention options like left atrial appendage occlusion.  For now, I suggest she continue taking apixaban .  Sammy Crisp, MD [facility]

## 2023-07-10 NOTE — Telephone Encounter (Signed)
 Triage is unable to answer her questions about Eliquis  at this time, per MD she will need to be seen to discuss.   Lets get her in to see Dr.End if that's who she recommends to see to discuss other alternative medication options.   Thank you!

## 2023-07-13 ENCOUNTER — Ambulatory Visit
Admission: RE | Admit: 2023-07-13 | Discharge: 2023-07-13 | Disposition: A | Discharge status: Home / Self Care | Source: Ambulatory Visit | Attending: Radiation Oncology | Admitting: Radiation Oncology

## 2023-07-13 DIAGNOSIS — K449 Diaphragmatic hernia without obstruction or gangrene: Secondary | ICD-10-CM | POA: Diagnosis not present

## 2023-07-13 DIAGNOSIS — Z923 Personal history of irradiation: Secondary | ICD-10-CM | POA: Insufficient documentation

## 2023-07-13 DIAGNOSIS — R911 Solitary pulmonary nodule: Secondary | ICD-10-CM | POA: Insufficient documentation

## 2023-07-13 DIAGNOSIS — C3490 Malignant neoplasm of unspecified part of unspecified bronchus or lung: Secondary | ICD-10-CM | POA: Diagnosis present

## 2023-07-13 DIAGNOSIS — E041 Nontoxic single thyroid nodule: Secondary | ICD-10-CM | POA: Insufficient documentation

## 2023-07-13 LAB — GLUCOSE, CAPILLARY: Glucose-Capillary: 105 mg/dL — ABNORMAL HIGH (ref 70–99)

## 2023-07-13 MED ORDER — FLUDEOXYGLUCOSE F - 18 (FDG) INJECTION
10.6000 | Freq: Once | INTRAVENOUS | Status: AC | PRN
Start: 2023-07-13 — End: 2023-07-13
  Administered 2023-07-13: 10.9 via INTRAVENOUS

## 2023-07-14 ENCOUNTER — Encounter (INDEPENDENT_AMBULATORY_CARE_PROVIDER_SITE_OTHER): Payer: Self-pay

## 2023-07-15 ENCOUNTER — Ambulatory Visit
Admission: RE | Admit: 2023-07-15 | Discharge: 2023-07-15 | Disposition: A | Discharge status: Home / Self Care | Source: Ambulatory Visit | Attending: Radiation Oncology | Admitting: Radiation Oncology

## 2023-07-15 ENCOUNTER — Encounter: Payer: Self-pay | Admitting: Radiation Oncology

## 2023-07-15 ENCOUNTER — Encounter: Payer: Self-pay | Admitting: Internal Medicine

## 2023-07-15 ENCOUNTER — Ambulatory Visit: Attending: Internal Medicine | Admitting: Internal Medicine

## 2023-07-15 VITALS — BP 100/56 | HR 77 | Ht 69.0 in | Wt 211.2 lb

## 2023-07-15 VITALS — BP 94/54 | HR 84 | Temp 98.0°F | Resp 20 | Wt 212.0 lb

## 2023-07-15 DIAGNOSIS — I442 Atrioventricular block, complete: Secondary | ICD-10-CM | POA: Diagnosis not present

## 2023-07-15 DIAGNOSIS — R5383 Other fatigue: Secondary | ICD-10-CM

## 2023-07-15 DIAGNOSIS — I5032 Chronic diastolic (congestive) heart failure: Secondary | ICD-10-CM

## 2023-07-15 DIAGNOSIS — I480 Paroxysmal atrial fibrillation: Secondary | ICD-10-CM | POA: Diagnosis not present

## 2023-07-15 DIAGNOSIS — R911 Solitary pulmonary nodule: Secondary | ICD-10-CM

## 2023-07-15 DIAGNOSIS — I2510 Atherosclerotic heart disease of native coronary artery without angina pectoris: Secondary | ICD-10-CM | POA: Diagnosis not present

## 2023-07-15 DIAGNOSIS — I6523 Occlusion and stenosis of bilateral carotid arteries: Secondary | ICD-10-CM | POA: Diagnosis not present

## 2023-07-15 DIAGNOSIS — J9611 Chronic respiratory failure with hypoxia: Secondary | ICD-10-CM

## 2023-07-15 NOTE — Progress Notes (Signed)
 Cardiology Office Note:  .   Date:  07/16/2023  ID:  Diana Cook, DOB 1950-12-06, MRN [id number] PCP: Diana Curt, MD  [facility] Providers Cardiologist:  Diana Crisp, MD     History of Present Illness: Diana Cook   Diana Cook is a 73 y.o. female with history of moderate coronary artery disease by cardiac CTA in 11/2018 and catheterization in 06/2019, carotid artery stenosis status post right carotid stenting (04/2022), complete heart block status post permanent pacemaker followed by Dr. Rodolfo Cook, paroxysmal atrial fibrillation, seizure disorder, COPD, and right breast cancer, who presents for follow-up of CAD and atrial fibrillation.  I last saw her in 02/2018 for, shortly after she developed marked vertigo.  ENT referral was pending.  Left basal ganglia lacunar infarct was noted, new since prior head CT in 2021.  Today, Diana Cook is concerned about weight gain, fatigue, and intermittent confusion.  She is concerned this could be related to apixaban , though on further questioning, symptoms began quite a while after she was started on this medicine in 2023.  She is also concerned about the high cost of apixaban  this year.  Diana Cook has not had any chest pain or palpitations.  She gets lightheaded at times but has not fallen or passed out.  She uses her as needed furosemide  infrequently though she has needed a little bit more than usual lately.  Chronic shortness of breath is stable.  She remains on supplemental oxygen .  No bleeding reported.  ROS: See HPI  Studies Reviewed: Diana Cook   EKG Interpretation Date/Time:  Wednesday Jul 15 2023 16:31:49 EDT Ventricular Rate:  77 PR Interval:  170 QRS Duration:  170 QT Interval:  444 QTC Calculation: 502 R Axis:   -71  Text Interpretation: Atrial-sensed ventricular-paced rhythm When compared with ECG of 25-Nov-2022 11:04, Vent. rate has increased BY   5 BPM Confirmed by Diana Cook [phone] on 07/16/2023 1:43:17 PM    Risk  Assessment/Calculations:    CHA2DS2-VASc Score = 5   This indicates a 7.2% annual risk of stroke. The patient's score is based upon: CHF History: 0 HTN History: 0 Diabetes History: 0 Stroke History: 2 Vascular Disease History: 1 Age Score: 1 Gender Score: 1            Physical Exam:   VS:  BP (!) 100/56 (BP Location: Left Arm, Patient Position: Sitting)   Pulse 77   Ht 5\' 9"  (1.753 m)   Wt 211 lb 3.2 oz (95.8 kg)   SpO2 92%   BMI 31.19 kg/m    Wt Readings from Last 3 Encounters:  07/15/23 212 lb (96.2 kg)  07/15/23 211 lb 3.2 oz (95.8 kg)  07/02/23 205 lb 14.4 oz (93.4 kg)  Repeat BP: 98/62  General:  NAD. Neck: No JVD or HJR. Lungs: Mildly diminished breath sound throughout without wheezes or crackles. Heart: Regular rate and rhythm without murmurs, rubs, or gallops. Abdomen: Soft, nontender, nondistended. Extremities: Trace pretibial edema.  ASSESSMENT AND PLAN: .    Paroxysmal atrial fibrillation complete heart block: Diana Cook remains in the atrially sensed and ventricularly paced rhythm today.  Most recent device interrogation showed a single episode of ATR/PAT.  She is concerned about potential side effects of apixaban , though her constellation of symptoms (fatigue, confusion, and weight gain" began quite a while after this medication was started and is very atypical.  She is also concerned about the high cost of apixaban .  We discussed alternative therapies including dabigatran and  warfarin but have ultimately agreed to stick with apixaban  which will hopefully be less expensive as she meets her out-of-pocket maximum this year.  Defer AV nodal blocking agents with ongoing device follow-up through the device clinic.  Coronary artery disease: Prior coronary CTA and subsequent catheterization showed moderate nonobstructive CAD.  No angina reported.  Continue atorvastatin  for secondary prevention.  Given that she is on apixaban , we could consider aspirin  cessation, though  we need to confirm that she will be able to stay on apixaban  long-term.  Carotid artery stenosis: Status post right carotid stent.  Continue aspirin  and apixaban  for now, though would favor discontinuation of aspirin  in the future if she can remain on apixaban .  Continue statin therapy for target LDL less than 70.  Chronic respiratory failure with hypoxia, chronic HFpEF, and fatigue: I suspect underlying lung disease is driving much of Diana Cook's fatigue, though an element of chronic HFpEF is likely contributing.  We have agreed to repeat an echocardiogram to ensure that her LVEF has not declined.  Continue furosemide  40 mg daily as needed for weight gain/edema.  Continue Stiolto per Diana Cook as well as chronic oxygen  supplementation.     Dispo: Return to clinic in 3 months.  Signed, Diana Crisp, MD

## 2023-07-15 NOTE — Progress Notes (Signed)
 Radiation Oncology Follow up Note  Name: Diana Cook   Date:   07/15/2023 MRN:  [id number] DOB: January 30, 1951    This 73 y.o. female presents to the clinic today for follow-up of her PET scan and the patient with a newly detected left peripheral lung lesion and patient previously treated with SBRT close to 20 months prior for stage I non-small cell cancer.  She is also had right breast treated over 5 years prior.  REFERRING PROVIDER: Tower, Manley Seeds, MD  HPI: Patient is a 73 year old female known seen today for follow-up of her PET CT scan.  She had newly.  Seen peripheral left lower lobe e lesion worrisome for a malignancy.  I performed a PET scan on her which has not been formally read but my interpretation there is hypermetabolic activity even though the small size of this nodule.  This is all consistent with another stage I non-small cell lung cancer she remains asymptomatic specifically Nuys cough hemoptysis or chest tightness.  COMPLICATIONS OF TREATMENT: none  FOLLOW UP COMPLIANCE: keeps appointments   PHYSICAL EXAM:  BP (!) 94/54 Comment: Patient denies symptoms of hypotension.  Pulse 84   Temp 98 F (36.7 C) (Tympanic)   Resp 20   Wt 212 lb (96.2 kg)   BMI 31.31 kg/m  Well-developed well-nourished patient in NAD. HEENT reveals PERLA, EOMI, discs not visualized.  Oral cavity is clear. No oral mucosal lesions are identified. Neck is clear without evidence of cervical or supraclavicular adenopathy. Lungs are clear to A&P. Cardiac examination is essentially unremarkable with regular rate and rhythm without murmur rub or thrill. Abdomen is benign with no organomegaly or masses noted. Motor sensory and DTR levels are equal and symmetric in the upper and lower extremities. Cranial nerves II through XII are grossly intact. Proprioception is intact. No peripheral adenopathy or edema is identified. No motor or sensory levels are noted. Crude visual fields are within normal range.  RADIOLOGY  RESULTS: CT scans and PET CT scans reviewed compatible with above-stated findings  PLAN: At the present time patient is doing well.  I have offered SBRT 60 Gray in 5 fractions to her left peripheral lung lesion.  Risks and benefits of treatment including extreme low side effect profile possibly developing cough possible fatigue and possible slight chance of skin reaction all were discussed in detail with the patient.  She seems to comprehend the treatment plan well.  I have set her up for simulation after the [facility] Day holiday.  I would like to take this opportunity to thank you for allowing me to participate in the care of your patient.Glenis Langdon, MD

## 2023-07-15 NOTE — Patient Instructions (Signed)
 Medication Instructions:  Your physician recommends that you continue on your current medications as directed. Please refer to the Current Medication list given to you today.   *If you need a refill on your cardiac medications before your next appointment, please call your pharmacy*  Lab Work: No labs ordered today   Testing/Procedures: Your physician has requested that you have an echocardiogram. Echocardiography is a painless test that uses sound waves to create images of your heart. It provides your doctor with information about the size and shape of your heart and how well your heart's chambers and valves are working.   You may receive an ultrasound enhancing agent through an IV if needed to better visualize your heart during the echo. This procedure takes approximately one hour.  There are no restrictions for this procedure.  This will take place at [address] [facility] [address], [place] [address]  Please note: We ask at that you not bring children with you during ultrasound (echo/ vascular) testing. Due to room size and safety concerns, children are not allowed in the ultrasound rooms during exams. Our front office staff cannot provide observation of children in our lobby area while testing is being conducted. An adult accompanying a patient to their appointment will only be allowed in the ultrasound room at the discretion of the ultrasound technician under special circumstances. We apologize for any inconvenience.   Follow-Up: At [facility], you and your health needs are our priority.  As part of our continuing mission to provide you with exceptional heart care, our providers are all part of one team.  This team includes your primary Cardiologist (physician) and Advanced Practice Providers or APPs (Physician Assistants and Nurse Practitioners) who all work together to provide you with the care you need, when you need it.  Your next appointment:   3  month(s)  Provider:   You may see Sammy Crisp, MD or one of the following Advanced Practice Providers on your designated Care Team:   Laneta Pintos, NP Gildardo Labrador, PA-C Varney Gentleman, PA-C Cadence Marianne, PA-C Ronald Cockayne, NP Morey Ar, NP

## 2023-07-16 ENCOUNTER — Encounter: Payer: Self-pay | Admitting: Internal Medicine

## 2023-07-17 NOTE — Progress Notes (Signed)
 Remote pacemaker transmission.

## 2023-07-22 ENCOUNTER — Ambulatory Visit
Admission: RE | Admit: 2023-07-22 | Discharge: 2023-07-22 | Discharge status: Home / Self Care | Source: Ambulatory Visit | Attending: Radiation Oncology | Admitting: Radiation Oncology

## 2023-07-22 DIAGNOSIS — R911 Solitary pulmonary nodule: Secondary | ICD-10-CM | POA: Diagnosis not present

## 2023-07-24 DIAGNOSIS — R911 Solitary pulmonary nodule: Secondary | ICD-10-CM | POA: Diagnosis not present

## 2023-07-28 ENCOUNTER — Ambulatory Visit
Admission: RE | Admit: 2023-07-28 | Discharge: 2023-07-28 | Disposition: A | Discharge status: Home / Self Care | Source: Ambulatory Visit | Attending: Radiation Oncology | Admitting: Radiation Oncology

## 2023-07-28 ENCOUNTER — Other Ambulatory Visit: Payer: Self-pay

## 2023-07-28 DIAGNOSIS — Z923 Personal history of irradiation: Secondary | ICD-10-CM | POA: Insufficient documentation

## 2023-07-28 DIAGNOSIS — Z853 Personal history of malignant neoplasm of breast: Secondary | ICD-10-CM | POA: Insufficient documentation

## 2023-07-28 DIAGNOSIS — Z85118 Personal history of other malignant neoplasm of bronchus and lung: Secondary | ICD-10-CM | POA: Diagnosis not present

## 2023-07-28 DIAGNOSIS — R911 Solitary pulmonary nodule: Secondary | ICD-10-CM | POA: Insufficient documentation

## 2023-07-28 LAB — RAD ONC ARIA SESSION SUMMARY
Course Elapsed Days: 0
Plan Fractions Treated to Date: 1
Plan Prescribed Dose Per Fraction: 12 Gy
Plan Total Fractions Prescribed: 5
Plan Total Prescribed Dose: 60 Gy
Reference Point Dosage Given to Date: 12 Gy
Reference Point Session Dosage Given: 12 Gy
Session Number: 1

## 2023-07-30 ENCOUNTER — Ambulatory Visit: Attending: Internal Medicine

## 2023-07-30 DIAGNOSIS — I5030 Unspecified diastolic (congestive) heart failure: Secondary | ICD-10-CM

## 2023-07-30 DIAGNOSIS — J9611 Chronic respiratory failure with hypoxia: Secondary | ICD-10-CM | POA: Diagnosis not present

## 2023-07-30 LAB — ECHOCARDIOGRAM COMPLETE
AR max vel: 3.16 cm2
AV Area VTI: 3.15 cm2
AV Area mean vel: 3.16 cm2
AV Mean grad: 3.5 mmHg
AV Peak grad: 5.6 mmHg
Ao pk vel: 1.19 m/s
Area-P 1/2: 3.27 cm2
Calc EF: 56.1 %
S' Lateral: 3.4 cm
Single Plane A2C EF: 58.1 %
Single Plane A4C EF: 54 %

## 2023-07-31 ENCOUNTER — Ambulatory Visit: Payer: Self-pay | Admitting: Internal Medicine

## 2023-07-31 ENCOUNTER — Other Ambulatory Visit: Payer: Self-pay

## 2023-07-31 ENCOUNTER — Ambulatory Visit
Admission: RE | Admit: 2023-07-31 | Discharge: 2023-07-31 | Disposition: A | Discharge status: Home / Self Care | Source: Ambulatory Visit | Attending: Radiation Oncology | Admitting: Radiation Oncology

## 2023-07-31 DIAGNOSIS — R911 Solitary pulmonary nodule: Secondary | ICD-10-CM | POA: Diagnosis not present

## 2023-07-31 LAB — RAD ONC ARIA SESSION SUMMARY
Course Elapsed Days: 3
Plan Fractions Treated to Date: 2
Plan Prescribed Dose Per Fraction: 12 Gy
Plan Total Fractions Prescribed: 5
Plan Total Prescribed Dose: 60 Gy
Reference Point Dosage Given to Date: 24 Gy
Reference Point Session Dosage Given: 12 Gy
Session Number: 2

## 2023-08-01 ENCOUNTER — Ambulatory Visit
Admission: EM | Admit: 2023-08-01 | Discharge: 2023-08-01 | Disposition: A | Discharge status: Home / Self Care | Attending: Emergency Medicine | Admitting: Emergency Medicine

## 2023-08-01 DIAGNOSIS — R350 Frequency of micturition: Secondary | ICD-10-CM | POA: Insufficient documentation

## 2023-08-01 LAB — POCT URINALYSIS DIP (MANUAL ENTRY)

## 2023-08-01 MED ORDER — CEPHALEXIN 500 MG PO CAPS: 500.0000 mg | ORAL_CAPSULE | Freq: Two times a day (BID) | ORAL | 0 refills | Status: AC

## 2023-08-01 NOTE — ED Provider Notes (Signed)
 Diana Cook    CSN: [id number] Arrival date & time: 08/01/23  1045      History   Chief Complaint Chief Complaint  Patient presents with   Dysuria    HPI Diana Cook is a 73 y.o. female.   Patient presents for evaluation of urinary frequency, dysuria and urgency beginning yesterday.  Has not attempted treatment.  Denies abdominal, flank pain, fever or vaginal symptoms.  Past Medical History:  Diagnosis Date   Allergic rhinitis    Arthritis    Asthma    as a child, mild now   Breast cancer (HCC) 12/2015   right breast cancer, lumpectomy and mammosite    Colon polyps    colonoscopy 7/08, tubular adenoma   Complete heart block (HCC) 09/2011   s/p PPM implanted in [facility]   COPD (chronic obstructive pulmonary disease) (HCC)    Myocardial infarction (HCC) 2011   Osteopenia 10/2015   Pacemaker    2011   Paroxysmal atrial fibrillation (HCC) 03/2021   Incidentally detected on pacemaker interrogation   Personal history of radiation therapy 2017   right breast ca, mammosite placed   Rotator cuff tear, right 02/2022   Had an MRI in [place] "I have 2 tears."   Seizure disorder (HCC)    Seizures (HCC)    first one was when she was 73 years old    Small bowel obstruction (HCC)    1988 and 2002   Tobacco abuse     Patient Active Problem List   Diagnosis Date Noted   Decreased GFR 04/29/2023   Presence of heart assist device (HCC) 03/25/2023   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/25/2023   Persistent cough 03/03/2023   Urinary urgency 02/09/2023   Bilateral carotid artery stenosis 04/29/2022   Carotid stenosis, symptomatic w/o infarct 03/23/2022   Right shoulder pain 11/25/2021   Mild anemia 11/03/2021   Thyroid  nodule 09/17/2021   Dyslipidemia 09/05/2021   Depression 09/05/2021   GERD without esophagitis 09/05/2021   Vitamin D  deficiency 08/20/2021   Colon cancer screening 07/30/2021   Hypercalcemia 07/29/2021   UTI (urinary tract  infection) 07/17/2021   Dysuria 07/08/2021   Arterial hypotension 07/08/2021   Paroxysmal atrial fibrillation (HCC) 04/11/2021   Current use of proton pump inhibitor 10/24/2020   Sleep apnea 10/24/2020   Fatigue 10/24/2020   Radicular pain in left arm 03/02/2020   Neck pain on left side 02/15/2020   Paresthesia 02/15/2020   Chronic respiratory failure with hypoxia (HCC) 11/17/2019   Chronic heart failure with preserved ejection fraction (HFpEF) (HCC) 08/11/2019   Coronary artery disease involving native coronary artery of native heart without angina pectoris 06/20/2019   Gastroesophageal reflux disease    Stomach irritation    Abnormal CT scan, esophagus    Polyp of ascending colon    Personal history of tobacco use, presenting hazards to health 01/18/2018   Grade I diastolic dysfunction 12/21/2017   Globus pharyngeus 12/21/2017   Chronic obstructive pulmonary disease (HCC) 12/15/2017   Hyperlipidemia 12/07/2017   Seizures (HCC) 10/01/2016   Elevated glucose 08/31/2016   Grief reaction 04/11/2016   History of breast cancer 12/11/2015   Osteoporosis 11/22/2015   Estrogen deficiency 09/07/2015   Wrinkles 09/07/2015   Post herpetic neuralgia 09/01/2014   Encounter for Medicare annual wellness exam 07/11/2014   Routine general medical examination at a health care facility 06/26/2013   H/O small bowel obstruction 04/15/2013   SOB (shortness of breath) 02/10/2013  Complete heart block (HCC) 11/03/2011   Pacemaker-[employer] 11/03/2011   Lumbar spinal stenosis 09/02/2011   POSTMENOPAUSAL STATUS 08/06/2009   HIDRADENITIS SUPPURATIVA 06/26/2008   HERNIATED DISC 12/14/2007   BACK, LOWER, PAIN 12/02/2007   Hyperkalemia 08/06/2007   History of colonic polyps 09/14/2006   Former smoker 06/10/2006   Asthma 06/10/2006   H/O idiopathic seizure 06/10/2006    Past Surgical History:  Procedure Laterality Date   ABDOMINAL HYSTERECTOMY     BREAST BIOPSY Right 2007   benign  inflammatory changes, mass due to underwire bra   BREAST BIOPSY Left 01/02/2016   columnar cell changes without atypical hyperplasia.   BREAST BIOPSY Right 12/06/2015   rt breast mass 10:00, bx done at Dr. Yves Herb office, invasive ductal carcinoma   BREAST EXCISIONAL BIOPSY Left 01/02/2016   COLUMNAR CELL CHANGE AND HYPERPLASIA ASSOCIATED WITH LUMINAL AND STROMAL CALCIFICATIONS   BREAST LUMPECTOMY Right 01/02/2016   invasive mammary carcinoma, clear margins, negative LN   BREAST LUMPECTOMY WITH SENTINEL LYMPH NODE BIOPSY Right 01/02/2016   pT1c, N0; ER/ PR 100%; Her 2 neu not over expressed: BREAST LUMPECTOMY WITH SENTINEL LYMPH NODE BX;  Surgeon: Marshall Skeeter, MD;  Location: [facility] ORS;  Service: General;  Laterality: Right;   CARDIAC CATHETERIZATION     CAROTID PTA/STENT INTERVENTION Right 04/29/2022   Procedure: CAROTID PTA/STENT INTERVENTION;  Surgeon: Jackquelyn Mass, MD;  Location: [facility] INVASIVE CV LAB;  Service: Cardiovascular;  Laterality: Right;   CATARACT EXTRACTION Bilateral    COLONOSCOPY  10/2015   Dr Howard Macho   COLONOSCOPY WITH PROPOFOL  N/A 08/19/2018   Procedure: COLONOSCOPY WITH PROPOFOL ;  Surgeon: Irby Mannan, MD;  Location: [facility] ENDOSCOPY;  Service: Endoscopy;  Laterality: N/A;   ESOPHAGOGASTRODUODENOSCOPY (EGD) WITH PROPOFOL  N/A 08/19/2018   Procedure: ESOPHAGOGASTRODUODENOSCOPY (EGD) WITH PROPOFOL ;  Surgeon: Irby Mannan, MD;  Location: [facility] ENDOSCOPY;  Service: Endoscopy;  Laterality: N/A;   EXPLORATORY LAPAROTOMY  01/25/2001   Exploratory laparotomy, lysis of adhesions, identification of internal hernia secondary to omental adhesion. Prolonged postoperative ileus.   gyn surgery  1993   hysterectomy- form endometriosis   LAPAROSCOPY     PACEMAKER INSERTION  10/24/11   [employer] Advantio dual chamber PPM implanted by Dr Annis Kinder at [facility] in [place]   PPM GENERATOR CHANGEOUT N/A 12/05/2019   Procedure: PPM GENERATOR  Alpheus Jarvis;  Surgeon: Verona Goodwill, MD;  Location: [facility] INVASIVE CV LAB;  Service: Cardiovascular;  Laterality: N/A;   RIGHT/LEFT HEART CATH AND CORONARY ANGIOGRAPHY Bilateral 07/19/2019   Procedure: RIGHT/LEFT HEART CATH AND CORONARY ANGIOGRAPHY;  Surgeon: Sammy Crisp, MD;  Location: [facility] INVASIVE CV LAB;  Service: Cardiovascular;  Laterality: Bilateral;   TEMPORARY PACEMAKER N/A 12/05/2019   Procedure: TEMPORARY PACEMAKER;  Surgeon: Verona Goodwill, MD;  Location: [facility] INVASIVE CV LAB;  Service: Cardiovascular;  Laterality: N/A;    OB History     Gravida  3   Para  2   Term      Preterm      AB  1   Living         SAB  1   IAB      Ectopic      Multiple      Live Births           Obstetric Comments  1st Menstrual Cycle:  12 1st Pregnancy:  22           Home Medications    Prior to Admission medications  Medication Sig Start Date End Date Taking? Authorizing Provider  cephALEXin  (KEFLEX ) 500 MG capsule Take 1 capsule (500 mg total) by mouth 2 (two) times daily for 5 days. 08/01/23 08/06/23 Yes Krina Mraz R, NP  acetaminophen  (TYLENOL ) 500 MG tablet Take 500-1,000 mg by mouth every 6 (six) hours as needed for mild pain or headache.     [provider]  albuterol  (VENTOLIN  HFA) 108 (90 Base) MCG/ACT inhaler INHALE 2 PUFFS BY MOUTH EVERY 4 HOURS AS NEEDED FOR WHEEZING OR SHORTNESS OF BREATH 05/25/23   Marc Senior, MD  apixaban  (ELIQUIS ) 5 MG TABS tablet Take 1 tablet (5 mg total) by mouth 2 (two) times daily. 06/17/23   Verona Goodwill, MD  aspirin  EC 81 MG tablet Take 1 tablet (81 mg total) by mouth daily. 12/05/22   Schnier, Ninette Basque, MD  atorvastatin  (LIPITOR) 40 MG tablet Take 1 tablet by mouth once daily 02/13/23   Verona Goodwill, MD  benzonatate  (TESSALON ) 200 MG capsule Take 1 capsule (200 mg total) by mouth 2 (two) times daily as needed for cough. 03/03/23   Bedsole, Amy E, MD  Cholecalciferol  (VITAMIN D3) 50 MCG (2000 UT) capsule Take  2,000 Units by mouth daily.    [provider]  FLUoxetine  (PROZAC ) 20 MG capsule Take 1 capsule by mouth once daily 06/23/23   Tower, Manley Seeds, MD  furosemide  (LASIX ) 40 MG tablet Take 1 tablet (40 mg total) by mouth daily as needed (swelling, wt gain or needing more oxygen ). 09/09/21   Darus Engels A, DO  gabapentin  (NEURONTIN ) 100 MG capsule Take 100 mg by mouth daily as needed. 03/02/23   [provider]  levETIRAcetam  (KEPPRA ) 250 MG tablet Take 250 mg by mouth 2 (two) times daily.  11/02/19   [provider]  nitrofurantoin , macrocrystal-monohydrate, (MACROBID ) 100 MG capsule Take 1 capsule (100 mg total) by mouth 2 (two) times daily. 05/11/23   Tower, Manley Seeds, MD  OXYGEN  Inhale 4 L into the lungs continuous.    [provider]  pantoprazole  (PROTONIX ) 40 MG tablet Take 1 tablet (40 mg total) by mouth daily. 11/20/22 11/20/23  Marc Senior, MD  Tiotropium Bromide-Olodaterol (STIOLTO RESPIMAT ) 2.5-2.5 MCG/ACT AERS INHALE 2 PUFFS BY MOUTH ONCE DAILY 05/25/23   Marc Senior, MD  zolpidem  (AMBIEN ) 10 MG tablet TAKE 1/2 TO 1 (ONE-HALF TO ONE) TABLET BY MOUTH AT BEDTIME AS NEEDED FOR SLEEP 06/23/23   Tower, Manley Seeds, MD    Family History Family History  Problem Relation Age of Onset   Stroke Mother    Heart disease Mother 38       MI and CABG   Dementia Mother    Coronary artery disease Father    Parkinsonism Father    Breast cancer Sister    Cancer - Cervical Daughter 7       died 2023/03/17   Heart attack Brother 59   Colon cancer Neg Hx     Social History Social History   Tobacco Use   Smoking status: Former    Current packs/day: 0.00    Average packs/day: 1.5 packs/day for 42.0 years (63.0 ttl pk-yrs)    Types: Cigarettes    Start date: 11/10/1968    Quit date: 11/11/2010    Years since quitting: 12.7   Smokeless tobacco: Never  Vaping Use   Vaping status: Never Used  Substance Use Topics   Alcohol use: Yes    Alcohol/week: 1.0 - 2.0  standard drink  of alcohol    Types: 1 - 2 Glasses of wine per week    Comment: weekly   Drug use: No     Allergies   Codeine and Morphine  and codeine   Review of Systems Review of Systems   Physical Exam Triage Vital Signs ED Triage Vitals  Encounter Vitals Group     BP 08/01/23 1133 97/68     Systolic BP Percentile --      Diastolic BP Percentile --      Pulse Rate 08/01/23 1133 68     Resp 08/01/23 1133 16     Temp --      Temp Source 08/01/23 1133 Temporal     SpO2 08/01/23 1133 (S) (!) 88 %     Weight --      Height --      Head Circumference --      Peak Flow --      Pain Score 08/01/23 1132 0     Pain Loc --      Pain Education --      Exclude from Growth Chart --    No data found.  Updated Vital Signs BP 97/68 (BP Location: Left Arm)   Pulse 68   Resp 16   SpO2 (S) (!) 88% Comment: baseline  Visual Acuity Right Eye Distance:   Left Eye Distance:   Bilateral Distance:    Right Eye Near:   Left Eye Near:    Bilateral Near:     Physical Exam Constitutional:      Appearance: Normal appearance.  Eyes:     Extraocular Movements: Extraocular movements intact.  Pulmonary:     Effort: Pulmonary effort is normal.  Abdominal:     Tenderness: There is no abdominal tenderness. There is no right CVA tenderness, left CVA tenderness or guarding.  Neurological:     Mental Status: She is alert and oriented to person, place, and time.      UC Treatments / Results  Labs (all labs ordered are listed, but only abnormal results are displayed) Labs Reviewed  POCT URINALYSIS DIP (MANUAL ENTRY) - Abnormal; Notable for the following components:      Result Value   Clarity, UA cloudy (*)    All other components within normal limits  URINE CULTURE    EKG   Radiology ECHOCARDIOGRAM COMPLETE Result Date: 07/30/2023    ECHOCARDIOGRAM REPORT   Patient Name:   Diana Cook Date of Exam: 07/30/2023 Medical Rec #:  [id number]      Height:       69.0 in Accession  #:    [id number]     Weight:       211.2 lb Date of Birth:  1950/04/26       BSA:          2.114 m Patient Age:    73 years       BP:           100/56 mmHg Patient Gender: F              HR:           74 bpm. Exam Location:  [place] Procedure: 2D Echo, Cardiac Doppler and Color Doppler (Both Spectral and Color            Flow Doppler were utilized during procedure). Indications:    Chronic respiratory failure with hypoxia J96.11  History:        Patient has prior history of Echocardiogram  examinations, most                 recent 07/17/2023. CHF, CAD, Pacemaker, Arrythmias:Atrial                 Fibrillation; Signs/Symptoms:Hypotension.  Sonographer:    Alicia Inoue Referring Phys: [phone] CHRISTOPHER END IMPRESSIONS  1. Left ventricular ejection fraction, by estimation, is 55 to 60%. Left ventricular ejection fraction by 2D MOD biplane is 56.1 %. The left ventricle has normal function. The left ventricle has no regional wall motion abnormalities. Left ventricular diastolic parameters are consistent with Grade I diastolic dysfunction (impaired relaxation).  2. Right ventricular systolic function is normal. The right ventricular size is normal.  3. The mitral valve is normal in structure. Trivial mitral valve regurgitation.  4. The aortic valve is tricuspid. Aortic valve regurgitation is not visualized. FINDINGS  Left Ventricle: Left ventricular ejection fraction, by estimation, is 55 to 60%. Left ventricular ejection fraction by 2D MOD biplane is 56.1 %. The left ventricle has normal function. The left ventricle has no regional wall motion abnormalities. The left ventricular internal cavity size was normal in size. There is no left ventricular hypertrophy. Left ventricular diastolic parameters are consistent with Grade I diastolic dysfunction (impaired relaxation). Right Ventricle: The right ventricular size is normal. No increase in right ventricular wall thickness. Right ventricular systolic function is normal.  Left Atrium: Left atrial size was normal in size. Right Atrium: Right atrial size was normal in size. Pericardium: There is no evidence of pericardial effusion. Mitral Valve: The mitral valve is normal in structure. Trivial mitral valve regurgitation. Tricuspid Valve: The tricuspid valve is normal in structure. Tricuspid valve regurgitation is not demonstrated. Aortic Valve: The aortic valve is tricuspid. Aortic valve regurgitation is not visualized. Aortic valve mean gradient measures 3.5 mmHg. Aortic valve peak gradient measures 5.6 mmHg. Aortic valve area, by VTI measures 3.15 cm. Pulmonic Valve: The pulmonic valve was not well visualized. Pulmonic valve regurgitation is not visualized. Aorta: The aortic root and ascending aorta are structurally normal, with no evidence of dilitation. Venous: The inferior vena cava was not well visualized. IAS/Shunts: No atrial level shunt detected by color flow Doppler.  LEFT VENTRICLE PLAX 2D                        Biplane EF (MOD) LVIDd:         5.30 cm         LV Biplane EF:   Left LVIDs:         3.40 cm                          ventricular LV PW:         1.10 cm                          ejection LV IVS:        1.00 cm                          fraction by LVOT diam:     2.20 cm                          2D MOD LV SV:         80  biplane is LV SV Index:   38                               56.1 %. LVOT Area:     3.80 cm                                Diastology                                LV e' medial:    3.81 cm/s LV Volumes (MOD)               LV E/e' medial:  15.8 LV vol d, MOD    62.1 ml       LV e' lateral:   6.42 cm/s A2C:                           LV E/e' lateral: 9.4 LV vol d, MOD    73.0 ml A4C: LV vol s, MOD    26.0 ml A2C: LV vol s, MOD    33.6 ml A4C: LV SV MOD A2C:   36.1 ml LV SV MOD A4C:   73.0 ml LV SV MOD BP:    38.6 ml RIGHT VENTRICLE RV Basal diam:  3.30 cm RV Mid diam:    2.70 cm RV S prime:     9.25 cm/s TAPSE (M-mode): 2.1  cm LEFT ATRIUM             Index        RIGHT ATRIUM           Index LA Vol (A2C):   46.4 ml 21.94 ml/m  RA Area:     14.90 cm LA Vol (A4C):   50.8 ml 24.03 ml/m  RA Volume:   36.70 ml  17.36 ml/m LA Biplane Vol: 51.5 ml 24.36 ml/m  AORTIC VALVE AV Area (Vmax):    3.16 cm AV Area (Vmean):   3.16 cm AV Area (VTI):     3.15 cm AV Vmax:           118.50 cm/s AV Vmean:          81.950 cm/s AV VTI:            0.255 m AV Peak Grad:      5.6 mmHg AV Mean Grad:      3.5 mmHg LVOT Vmax:         98.50 cm/s LVOT Vmean:        68.100 cm/s LVOT VTI:          0.211 m LVOT/AV VTI ratio: 0.83  AORTA Ao Sinus diam: 3.40 cm Ao STJ diam:   2.5 cm Ao Asc diam:   3.20 cm MITRAL VALVE                TRICUSPID VALVE MV Area (PHT): 3.27 cm     TR Peak grad:   26.0 mmHg MV Decel Time: 232 msec     TR Vmax:        255.00 cm/s MV E velocity: 60.30 cm/s MV A velocity: 112.00 cm/s  SHUNTS MV E/A ratio:  0.54         Systemic VTI:  0.21 m  Systemic Diam: 2.20 cm Constancia Delton MD Electronically signed by Constancia Delton MD Signature Date/Time: 07/30/2023/3:37:17 PM    Final     Procedures Procedures (including critical care time)  Medications Ordered in UC Medications - No data to display  Initial Impression / Assessment and Plan / UC Course  I have reviewed the triage vital signs and the nursing notes.  Pertinent labs & imaging results that were available during my care of the patient were reviewed by me and considered in my medical decision making (see chart for details).  Urinary frequency  Urinalysis inconclusive, sent to culture, empirically placed on cephalexin  and recommended supportive care with follow-up as needed Final Clinical Impressions(s) / UC Diagnoses   Final diagnoses:  Urinary frequency   Discharge Instructions      Your urinalysis culture came back inconclusive, your urine will be sent to the lab to determine exactly which bacteria is present, if any changes need  to be made to your medications you will be notified  Begin use of keflex  every 12 hours for 5 days  You may use over-the-counter Azo to help minimize your symptoms until antibiotic removes bacteria, this medication will turn your urine orange  Increase your fluid intake through use of water  As always practice good hygiene, wiping front to back and avoidance of scented vaginal products to prevent further irritation  If symptoms continue to persist after use of medication or recur please follow-up with urgent care or your primary doctor as needed   ED Prescriptions     Medication Sig Dispense Auth. Provider   cephALEXin  (KEFLEX ) 500 MG capsule Take 1 capsule (500 mg total) by mouth 2 (two) times daily for 5 days. 10 capsule Rosary Filosa R, NP      PDMP not reviewed this encounter.   Reena Canning, NP 08/01/23 1336

## 2023-08-01 NOTE — ED Triage Notes (Signed)
 Patient presents to UC for dysuria, urgency since yesterday. No OTC meds.

## 2023-08-01 NOTE — ED Notes (Signed)
 I ran urinalysis 2 times but due to quality of urine the clintek machine unable to read. Sending urine to lab for culture. TB

## 2023-08-01 NOTE — Discharge Instructions (Addendum)
 Your urinalysis culture came back inconclusive, your urine will be sent to the lab to determine exactly which bacteria is present, if any changes need to be made to your medications you will be notified  Begin use of keflex  every 12 hours for 5 days  You may use over-the-counter Azo to help minimize your symptoms until antibiotic removes bacteria, this medication will turn your urine orange  Increase your fluid intake through use of water  As always practice good hygiene, wiping front to back and avoidance of scented vaginal products to prevent further irritation  If symptoms continue to persist after use of medication or recur please follow-up with urgent care or your primary doctor as needed

## 2023-08-02 LAB — URINE CULTURE: Culture: NO GROWTH

## 2023-08-03 ENCOUNTER — Ambulatory Visit
Admission: RE | Admit: 2023-08-03 | Discharge: 2023-08-03 | Disposition: A | Discharge status: Home / Self Care | Source: Ambulatory Visit | Attending: Radiation Oncology | Admitting: Radiation Oncology

## 2023-08-03 ENCOUNTER — Telehealth: Payer: Self-pay

## 2023-08-03 ENCOUNTER — Other Ambulatory Visit: Payer: Self-pay

## 2023-08-03 ENCOUNTER — Ambulatory Visit (HOSPITAL_COMMUNITY): Payer: Self-pay

## 2023-08-03 DIAGNOSIS — R911 Solitary pulmonary nodule: Secondary | ICD-10-CM | POA: Diagnosis not present

## 2023-08-03 LAB — RAD ONC ARIA SESSION SUMMARY
Course Elapsed Days: 6
Plan Fractions Treated to Date: 3
Plan Prescribed Dose Per Fraction: 12 Gy
Plan Total Fractions Prescribed: 5
Plan Total Prescribed Dose: 60 Gy
Reference Point Dosage Given to Date: 36 Gy
Reference Point Session Dosage Given: 12 Gy
Session Number: 3

## 2023-08-03 NOTE — Telephone Encounter (Signed)
 Per chart review tab pt seen 08/01/23 [facility]  sending to Dr Malissa Se.

## 2023-08-03 NOTE — Telephone Encounter (Signed)
 Aware, will watch for correspondence

## 2023-08-03 NOTE — Telephone Encounter (Signed)
 Diana Cook

## 2023-08-05 ENCOUNTER — Ambulatory Visit
Admission: RE | Admit: 2023-08-05 | Discharge: 2023-08-05 | Disposition: A | Discharge status: Home / Self Care | Source: Ambulatory Visit | Attending: Radiation Oncology | Admitting: Radiation Oncology

## 2023-08-05 ENCOUNTER — Other Ambulatory Visit: Payer: Self-pay

## 2023-08-05 DIAGNOSIS — R911 Solitary pulmonary nodule: Secondary | ICD-10-CM | POA: Diagnosis not present

## 2023-08-05 LAB — RAD ONC ARIA SESSION SUMMARY
Course Elapsed Days: 8
Plan Fractions Treated to Date: 4
Plan Prescribed Dose Per Fraction: 12 Gy
Plan Total Fractions Prescribed: 5
Plan Total Prescribed Dose: 60 Gy
Reference Point Dosage Given to Date: 48 Gy
Reference Point Session Dosage Given: 12 Gy
Session Number: 4

## 2023-08-06 NOTE — Progress Notes (Signed)
 [facility] - Physiatry  PROCEDURE: 1. Bilateral L4-5 transforaminal epidural injections under fluoroscopic guidance. 2. Use of fluoroscopic guidance was required to ensure adequate delivery of medication into the epidural space and for proper needle placement. 3. The patient was monitored with continuous pulse oximetry throughout the procedures.   DIAGNOSIS/INDICATION FOR PROCEDURE: The patient is a pleasant 73 year old female recently followed by [facility] for acute on chronic low back pain with radiation into the left greater than right buttock, lateral thighs, and into the posterior lateral calves.  Clinically she has symptoms consistent with a lumbosacral radiculitis, potentially L5 distribution.  She has also had pain radiating into the anterior lateral thigh to the knee consistent with an L4 distribution.  MRI of the lumbar spine from [place] dated 03/10/2023 both images and report were reviewed today. MRI LUMBAR SPINE WITHOUT CONTRAST  TECHNIQUE:  Multiplanar, multisequence MR imaging of the lumbar spine was  performed. No intravenous contrast was administered.  COMPARISON:  Lumbar spine MRI 12/15/2007  FINDINGS:  Segmentation: Standard.  Alignment: Mild stepwise retrolisthesis of L4 on L5 and L5 on S1.  Dextrocurvature of the lumbar spine centered at the L3 vertebral  body.  Vertebrae: No fracture, evidence of discitis, or bone lesion.  Multilevel degenerative endplate changes. There may be an osseous  hemangioma involving the left pedicle at L3  Conus medullaris and cauda equina: Conus extends to the L2 level.  Conus and cauda equina appear normal.  Paraspinal and other soft tissues: Negative.   Disc levels:  T12-L1: Mild bilateral facet degenerative change. Minimal eccentric  right disc bulge. No spinal canal narrowing. Mild right neural  foraminal narrowing.   L1-L2: Mild-to-moderate left and mild right facet degenerative  change.  Circumferential disc bulge. Mild overall spinal canal  narrowing. Moderate to severe left and moderate right neural  foraminal narrowing.   L2-L3: Eccentric left disc bulge. Mild bilateral facet degenerative  change. Mild overall spinal canal narrowing. There is narrowing of  the left lateral recess. Moderate to severe left and mild right  neural foraminal narrowing.   L3-L4: Mild-to-moderate bilateral facet degenerative change.  Eccentric left disc bulge mild overall spinal canal narrowing. There  is narrowing of the left lateral recess. Moderate bilateral neural  foraminal narrowing, left-greater-than-right.   L4-L5: Circumferential disc bulge. There is mild overall spinal  canal narrowing. There is narrowing of the right lateral recess.  Severe right and mild left neural foraminal narrowing. Mild  bilateral facet degenerative change, right-greater-than-left.   L5-S1: Mild bilateral facet degenerative change with ligamentum  flavum hypertrophy. Mild overall spinal canal narrowing. Moderate  left and severe right neural foraminal narrowing.   IMPRESSION:  1. No evidence of high grade spinal canal stenosis. There is  narrowing of the lateral recesses at L2-L3 (left), L3-L4 (left), and  L4-L5 (right).   2. Multilevel neural foraminal narrowing, severe on the right at  L4-L5 and L5-S1. Moderate to severe on the left neural foraminal  narrowing at L1-L2 and L2-L3. Findings have progressed compared to  2009.   Electronically Signed    By: Lyndall Gore M.D.    On: 03/19/2023 07:52   She is on oxygen , 4 L.  Medications include Eliquis  and Plavix .  She held Eliquis  for at least 48 hours prior to the procedure today.  She will restart Eliquis  at her next scheduled dose.  Her pain is rated 8/10.  Procedures: 08/06/2023: Bilateral L4-5 transforaminal ESI (Celestone  12 mg) 05/04/2023: Bilateral L4-5 transforaminal  ESI (50% relief, Celestone  12 mg) 01/26/2023: Left L5-S1 and right  S1 transforaminal ESI (less effective, Celestone  12 mg) 10/24/2022: Left L5-S1 and right S1 transforaminal ESI (50% relief, Celestone  12 mg) 07/11/2022: Left L5-S1 and right S1 transforaminal ESI (80% relief, Celestone  12 mg) 11/19/2021: Left L5-S1 and right S1 transforaminal ESI (85% relief , Celestone  12 mg) 04/05/2021: Left L5-S1 transforaminal ESI (good relief, Celestone  12 mg) 03/05/2020: Left L5-S1 transforaminal ESI (good relief, Celestone  12 mg) 09/22/2019: Left L5-S1 and left S1 transforaminal ESI (good relief, Celestone  12mg ) 05/06/2019: Left L5-S1 and left S1 transforaminal ESI (good relief, Celestone  12 mg) 03/10/2019: Left L5-S1 and left S1 transforaminal ESI (moderate to good relief, Celestone  12 mg) 11/30/2018: Left L4-5 transforaminal ESI (flare of pain, less effective, Celestone  12 mg) 09/21/2018: Left L5-S1 and left S1 transforaminal ESI 05/19/2018: Left L4-5 transforaminal ESI (30 to 40% relief) 04/27/2018: Left L5-S1 transforaminal ESI (30-50% relief) 03/15/2018: Left sacroiliac joint injection (good relief) 12/08/2013: Left L5-S1 transforaminal ESI (good relief, Celestone ) 11/03/2013: Left L5-S1 transforaminal ESI (50-60% relief) 10/20/2013: Left L5-S1 transforaminal ESI 03/02/2013: Left L5-S1 transforaminal ESI (80% relief) 01/18/2013: Left L5-S1 transforaminal ESI   IMPRESSION/RESULTS: Patient tolerated the procedure well without any complications.  I would recommend a 6 inch needle for the right side and a 5 inch needle for the left side.  We used a 5 inch needle (to the hub on the right side).  We also used Celestone  and 2% lidocaine .  Immediately after the procedure she rated her pain 1/10.  She will follow-up with [facility] in 3 to 4 weeks.   INFORMED CONSENT: The patient understood the potential risks and benefits of the procedure, which were explained to the patient prior to the procedure.  The patient read and signed the consent stating complete understanding of  the information, and wished to proceed with the procedure, there were no barriers to understanding.  Ample time was given for any questions to be answered prior to the procedure. The risks of the procedure were explained including, but not limited to the risk of bleeding and/or infection into the spinal area, intervertebral discs, or epidural space; nerve injury; nerve irritation; reaction to medications; etc.  The patient denied any history of bleeding disorders, allergies to medications used or other medical contraindications to the procedure.  No promises were given to any expected outcome.     PROCEDURE: A time out was performed by physician and assistant.  Correct patient, procedure, plan of care, site, position, and equipment were verified.  Identical procedural technique was used for both epidural injections.  The patient was sterilely prepped with a triple scrub of betadine solution and draped in the prone position.  Careful attention was paid to aseptic technique throughout the procedure.  Then, the Right L4-5 neuroforamen was identified under fluoroscopic guidance.  The overlying skin and subcutaneous tissues were anesthetized with approximately 2 cc of 1% Xylocaine .  A 25 gauge spinal needle was inserted down into the foramen.  Approximately 2 cc of Omnipaque -240 mgI/mL contrast was infiltrated under real time-fluoroscopy demonstrating satisfactory spread along the exiting spinal nerve and into the epidural space without vascular uptake.  No aspirate was noted.  Spot films were taken.  A solution containing 1.0 cc of Celestone  6mg /cc and 0.5 cc of 2% preservative-free lidocaine  was slowly infiltrated around the spinal nerve and into the epidural space under real-time fluoroscopy.  There was good contrast washout.  The needle was removed.   Then, the Left L4-5 neuroforamen was identified under  fluoroscopic guidance.  The overlying skin and subcutaneous tissues were anesthetized with approximately 2  cc of 1% Xylocaine .  A 25 gauge spinal needle was inserted down into the foramen.  Approximately 2 cc of Omnipaque -240 mgI/mL contrast was infiltrated under real time-fluoroscopy demonstrating satisfactory spread along the exiting spinal nerve and into the epidural space without vascular uptake.  No aspirate was noted.  Spot films were taken.  A solution containing 1.0 cc of Celestone  6mg /cc and 0.5 cc of 2% preservative-free lidocaine  was slowly infiltrated around the spinal nerve and into the epidural space under real-time fluoroscopy.  There was good contrast washout.  The needle was removed.  There were no complications.   DISCHARGE SUMMARY: The patient was monitored post-injection for 30 minutes in the recovery area and remained stable without evidence of complications.  The patient was discharged with discharge instructions in stable condition. Patient was instructed to contact us  with any problems.  Procedure fluoro time: 35 seconds. Fluoroscopy dose: 25.17 mGy.  This is below the dose threshold #1 (5000 mGy).    BENJAMIN CHARLES CHASNIS, DO

## 2023-08-10 ENCOUNTER — Ambulatory Visit
Admission: RE | Admit: 2023-08-10 | Discharge: 2023-08-10 | Disposition: A | Discharge status: Home / Self Care | Source: Ambulatory Visit | Attending: Radiation Oncology | Admitting: Radiation Oncology

## 2023-08-10 ENCOUNTER — Other Ambulatory Visit: Payer: Self-pay

## 2023-08-10 DIAGNOSIS — R911 Solitary pulmonary nodule: Secondary | ICD-10-CM | POA: Diagnosis not present

## 2023-08-10 LAB — RAD ONC ARIA SESSION SUMMARY
Course Elapsed Days: 13
Plan Fractions Treated to Date: 5
Plan Prescribed Dose Per Fraction: 12 Gy
Plan Total Fractions Prescribed: 5
Plan Total Prescribed Dose: 60 Gy
Reference Point Dosage Given to Date: 60 Gy
Reference Point Session Dosage Given: 12 Gy
Session Number: 5

## 2023-08-11 ENCOUNTER — Ambulatory Visit

## 2023-08-11 NOTE — Radiation Completion Notes (Signed)
 Patient Name: Diana Cook, Diana Cook MRN: [id number] Date of Birth: November 26, 1950 Referring Physician: Deri Fleet, M.D. Date of Service: 2023-08-11 Radiation Oncologist: Glenis Langdon, M.D. [facility] [place]                             RADIATION ONCOLOGY END OF TREATMENT NOTE     Diagnosis: R91.8 Other nonspecific abnormal finding of lung field Intent: Curative     HPI: Patient is a 73 year old female known seen today for follow-up of her PET CT scan.  She had newly.  Seen peripheral left lower lobe e lesion worrisome for a malignancy.  I performed a PET scan on her which has not been formally read but my interpretation there is hypermetabolic activity even though the small size of this nodule.  This is all consistent with another stage I non-small cell lung cancer she remains asymptomatic specifically [place] cough hemoptysis or chest tightness.      ==========DELIVERED PLANS==========  First Treatment Date: 2023-07-28 Last Treatment Date: 2023-08-10   Plan Name: Lung_L_SBRT Site: Lung, Left Technique: SBRT/SRT-IMRT Mode: Photon Dose Per Fraction: 12 Gy Prescribed Dose (Delivered / Prescribed): 60 Gy / 60 Gy Prescribed Fxs (Delivered / Prescribed): 5 / 5     ==========ON TREATMENT VISIT DATES========== 2023-07-28, 2023-07-31, 2023-08-03, 2023-08-03, 2023-08-05, 2023-08-10     ==========UPCOMING VISITS========== 10/21/2023 [facility] OFFICE VISIT End, Veryl Gottron, MD  09/09/2023 [facility] RAD ONCOLOGY FOLLOW UP 30 Glenis Langdon, MD  09/03/2023 [facility] REMOTE PACER [facility] DEVICE REMOTES        ==========APPENDIX - ON TREATMENT VISIT NOTES==========   See weekly On Treatment Notes in Epic for details in the Media tab (listed as Progress notes on the On Treatment Visit Dates listed above).

## 2023-08-13 ENCOUNTER — Ambulatory Visit

## 2023-08-17 ENCOUNTER — Ambulatory Visit

## 2023-08-24 ENCOUNTER — Telehealth: Payer: Self-pay | Admitting: *Deleted

## 2023-08-24 NOTE — Telephone Encounter (Signed)
 Msg from [place] in radiation. Pt has completed radiation now about 2 weeks ago. She is now back from the beach and states she had fallen while she was at the beach due to her being dizzy. Patient states she just overall isn't feeling well. She is dizzy, nausea, and weak. Patient is afraid she is going to fall again due to her being so dizzy. She states she has been like this for the past two weeks. She has only taken tylenol  and hadn't taken anything else because she was out of town. . Call back number [phone]  Rn spoke with Sidra - per NP- patient needs to see pcp. Patient has a h/o of heart failure- would not recommend replacement iv fluids per Josh. I left a vm for patient to contact her pcp

## 2023-08-26 ENCOUNTER — Other Ambulatory Visit: Payer: Self-pay

## 2023-08-26 ENCOUNTER — Emergency Department

## 2023-08-26 ENCOUNTER — Emergency Department
Admission: EM | Admit: 2023-08-26 | Discharge: 2023-08-26 | Disposition: A | Discharge status: Home / Self Care | Source: Ambulatory Visit | Attending: Emergency Medicine | Admitting: Emergency Medicine

## 2023-08-26 ENCOUNTER — Encounter: Payer: Self-pay | Admitting: Emergency Medicine

## 2023-08-26 DIAGNOSIS — R531 Weakness: Secondary | ICD-10-CM | POA: Insufficient documentation

## 2023-08-26 DIAGNOSIS — I2510 Atherosclerotic heart disease of native coronary artery without angina pectoris: Secondary | ICD-10-CM | POA: Insufficient documentation

## 2023-08-26 DIAGNOSIS — I951 Orthostatic hypotension: Secondary | ICD-10-CM | POA: Diagnosis not present

## 2023-08-26 DIAGNOSIS — J449 Chronic obstructive pulmonary disease, unspecified: Secondary | ICD-10-CM | POA: Insufficient documentation

## 2023-08-26 DIAGNOSIS — Z85118 Personal history of other malignant neoplasm of bronchus and lung: Secondary | ICD-10-CM | POA: Insufficient documentation

## 2023-08-26 LAB — BASIC METABOLIC PANEL WITH GFR
Anion gap: 11 (ref 5–15)
BUN: 24 mg/dL — ABNORMAL HIGH (ref 8–23)
CO2: 26 mmol/L (ref 22–32)
Calcium: 9 mg/dL (ref 8.9–10.3)
Chloride: 99 mmol/L (ref 98–111)
Creatinine, Ser: 1.22 mg/dL — ABNORMAL HIGH (ref 0.44–1.00)
GFR, Estimated: 46 mL/min — ABNORMAL LOW (ref >60)
Glucose, Bld: 100 mg/dL — ABNORMAL HIGH (ref 70–99)
Potassium: 3.7 mmol/L (ref 3.5–5.1)
Sodium: 136 mmol/L (ref 135–145)

## 2023-08-26 LAB — CBC
HCT: 37.3 % (ref 36.0–46.0)
Hemoglobin: 12.2 g/dL (ref 12.0–15.0)
MCH: 29.9 pg (ref 26.0–34.0)
MCHC: 32.7 g/dL (ref 30.0–36.0)
MCV: 91.4 fL (ref 80.0–100.0)
Platelets: 284 10*3/uL (ref 150–400)
RBC: 4.08 MIL/uL (ref 3.87–5.11)
RDW: 12.7 % (ref 11.5–15.5)
WBC: 8 10*3/uL (ref 4.0–10.5)
nRBC: 0 % (ref 0.0–0.2)

## 2023-08-26 LAB — TROPONIN I (HIGH SENSITIVITY): Troponin I (High Sensitivity): 10 ng/L (ref <18)

## 2023-08-26 LAB — LACTIC ACID, PLASMA: Lactic Acid, Venous: 1.3 mmol/L (ref 0.5–1.9)

## 2023-08-26 MED ORDER — SODIUM CHLORIDE 0.9 % IV BOLUS
500.0000 mL | Freq: Once | INTRAVENOUS | Status: AC
  Administered 2023-08-26: 500 mL via INTRAVENOUS

## 2023-08-26 NOTE — ED Triage Notes (Signed)
 Patient to ED via [facility] from podiatry office for Bp in the 80's and O2 in the 80's on baseline 2L [place]. Pt was there for a follow up appointment from previous injections. Hx lung cancer.

## 2023-08-26 NOTE — ED Provider Notes (Signed)
 [facility] Provider Note    Event Date/Time   First MD Initiated Contact with Patient 08/26/23 1305     (approximate)   History   Shortness of Breath and Hypotension   HPI  Diana Cook is a 73 y.o. female with history of lung cancer getting radiation treatment, COPD on chronic oxygen  (4 L) CAD, who presents with generalized weakness and low blood pressure.  The patient states that she last received radiation treatment about 3 weeks ago.  She subsequently started to feel weak.  This will normally last a few days and then get better, but this time it has persisted for the last 2 weeks and has worsened in the last few days.  She feels intermittently lightheaded or dizzy.  She also reports feeling intermittently more short of breath than normal.  She denies any vomiting but has had some diarrhea.  She has no chest pain.  She denies any fevers.  She was seen at [facility] clinic today and sent in due to hypotension as well as hypoxia on 4 L requiring an increase to 5 L.  I reviewed the past medical records including the clinic note from today which indicated the following:  At today's visit she presented stating that she was not feeling well. Her blood pressure was 80/54 when rechecked manually she was again noted to be 80/60. Oxygen  was 89% on 4 L we bumped this up to 5 L and discussed going to the ER for further evaluation as she was not feeling well. She was agreeable to go to the ER for further evaluation.   Physical Exam   Triage Vital Signs: ED Triage Vitals  Encounter Vitals Group     BP 08/26/23 1302 109/68     Girls Systolic BP Percentile --      Girls Diastolic BP Percentile --      Boys Systolic BP Percentile --      Boys Diastolic BP Percentile --      Pulse Rate 08/26/23 1302 89     Resp 08/26/23 1302 17     Temp 08/26/23 1302 97.9 F (36.6 C)     Temp Source 08/26/23 1302 Oral     SpO2 08/26/23 1302 97 %     Weight 08/26/23 1301 209 lb 7 oz  (95 kg)     Height 08/26/23 1301 5' 9 (1.753 m)     Head Circumference --      Peak Flow --      Pain Score 08/26/23 1300 5     Pain Loc --      Pain Education --      Exclude from Growth Chart --     Most recent vital signs: Vitals:   08/26/23 1400 08/26/23 1430  BP: (!) 144/72 (!) 143/73  Pulse: 74 79  Resp: (!) 22 (!) 25  Temp:    SpO2: 96% 94%     General: Alert and oriented, no distress.  CV:  Good peripheral perfusion.  Resp:  Normal effort.  Somewhat diminished breath sounds bilaterally.  No wheezes or rales. Abd:  No distention.  Other:  No peripheral edema.  Dry mucous membranes.  Motor intact in all extremities.   ED Results / Procedures / Treatments   Labs (all labs ordered are listed, but only abnormal results are displayed) Labs Reviewed  BASIC METABOLIC PANEL WITH GFR - Abnormal; Notable for the following components:      Result Value   Glucose, Bld  100 (*)    BUN 24 (*)    Creatinine, Ser 1.22 (*)    GFR, Estimated 46 (*)    All other components within normal limits  CBC  LACTIC ACID, PLASMA  URINALYSIS, ROUTINE W REFLEX MICROSCOPIC  TROPONIN I (HIGH SENSITIVITY)     EKG  ED ECG REPORT I, Waylon Cassis, the attending physician, personally viewed and interpreted this ECG.  Date: 08/26/2023 EKG Time: 1306 Rate: 88 Rhythm: Paced rhythm QRS Axis: normal ST/T Wave abnormalities: normal Narrative Interpretation: no evidence of acute ischemia; no significant change when compared to EKG of 07/15/2023    RADIOLOGY  Chest x-ray: I independently viewed and interpreted the images; there is no focal consolidation or edema  PROCEDURES:  Critical Care performed: No  Procedures   MEDICATIONS ORDERED IN ED: Medications  sodium chloride  0.9 % bolus 500 mL (500 mLs Intravenous New Bag/Given 08/26/23 1345)     IMPRESSION / MDM / ASSESSMENT AND PLAN / ED COURSE  I reviewed the triage vital signs and the nursing notes.  73 year old female  with PMH as noted above presents with generalized weakness for approximately last 2 weeks, noted to be hypotensive and hypoxic at a clinic visit today.  On arrival to the ED her blood pressure is a bit better.  Other vital signs are normal although she is not requiring her O2 to be put up to 5 L.    Differential diagnosis includes, but is not limited to, acute infection/sepsis, COPD exacerbation, dehydration/hypovolemia, other metabolic cause, less likely cardiac etiology.  We will obtain lab workup including cardiac enzymes and lactic acid, chest x-ray, and give a fluid bolus and reassess.  Patient's presentation is most consistent with acute presentation with potential threat to life or bodily function.  The patient is on the cardiac monitor to evaluate for evidence of arrhythmia and/or significant heart rate changes.  ----------------------------------------- 3:25 PM on 08/26/2023 -----------------------------------------  The ED workup is reassuring.  The chest x-ray shows no acute findings.  BMP and CBC are unremarkable.  Troponin and lactate are both within normal limits.  There is no indication for repeats.  I turned the oxygen  back down to her baseline 4 L and the O2 saturation remains in the mid 90s.  Her blood pressure is also remained normal throughout her ED stay with no recurrent hypotension.  Is possible that she had a vasovagal episode or other transient cause of the hypotension earlier.  I did consider whether she may benefit from inpatient admission for further monitoring, but given the stable vital signs and reassuring workup, she is appropriate for discharge home.  The patient herself feels well and would like to go home.  I gave strict return precautions, she expressed understanding.  FINAL CLINICAL IMPRESSION(S) / ED DIAGNOSES   Final diagnoses:  Weakness     Rx / DC Orders   ED Discharge Orders     None        Note:  This document was prepared using Dragon voice  recognition software and may include unintentional dictation errors.    Cassis Waylon, MD 08/26/23 1527

## 2023-08-26 NOTE — Discharge Instructions (Signed)
 Your workup in the ER today is reassuring.  There are no signs of pneumonia, other infection, or sepsis.  Follow-up with your regular doctor.  In the meantime, return to the ER for new, worsening, or persistent severe weakness, difficulty breathing, lightheadedness, feeling like you are going to pass out, recurrent falls, or any other new or worsening symptoms that concern you.

## 2023-08-27 ENCOUNTER — Ambulatory Visit: Admitting: Internal Medicine

## 2023-08-28 ENCOUNTER — Emergency Department

## 2023-08-28 ENCOUNTER — Inpatient Hospital Stay
Admission: EM | Admit: 2023-08-28 | Discharge: 2023-09-11 | DRG: 312 | Disposition: A | Discharge status: Home / Self Care | Attending: Hospitalist | Admitting: Hospitalist

## 2023-08-28 ENCOUNTER — Other Ambulatory Visit: Payer: Self-pay

## 2023-08-28 DIAGNOSIS — Z823 Family history of stroke: Secondary | ICD-10-CM

## 2023-08-28 DIAGNOSIS — W19XXXA Unspecified fall, initial encounter: Principal | ICD-10-CM | POA: Diagnosis present

## 2023-08-28 DIAGNOSIS — M5416 Radiculopathy, lumbar region: Secondary | ICD-10-CM | POA: Diagnosis present

## 2023-08-28 DIAGNOSIS — J449 Chronic obstructive pulmonary disease, unspecified: Secondary | ICD-10-CM | POA: Diagnosis present

## 2023-08-28 DIAGNOSIS — Z85118 Personal history of other malignant neoplasm of bronchus and lung: Secondary | ICD-10-CM

## 2023-08-28 DIAGNOSIS — Z860100 Personal history of colon polyps, unspecified: Secondary | ICD-10-CM

## 2023-08-28 DIAGNOSIS — Z9181 History of falling: Secondary | ICD-10-CM

## 2023-08-28 DIAGNOSIS — I951 Orthostatic hypotension: Secondary | ICD-10-CM | POA: Diagnosis not present

## 2023-08-28 DIAGNOSIS — Z7901 Long term (current) use of anticoagulants: Secondary | ICD-10-CM

## 2023-08-28 DIAGNOSIS — E785 Hyperlipidemia, unspecified: Secondary | ICD-10-CM | POA: Diagnosis present

## 2023-08-28 DIAGNOSIS — Z9981 Dependence on supplemental oxygen: Secondary | ICD-10-CM

## 2023-08-28 DIAGNOSIS — R42 Dizziness and giddiness: Secondary | ICD-10-CM | POA: Diagnosis not present

## 2023-08-28 DIAGNOSIS — Z1611 Resistance to penicillins: Secondary | ICD-10-CM | POA: Diagnosis present

## 2023-08-28 DIAGNOSIS — Z79899 Other long term (current) drug therapy: Secondary | ICD-10-CM

## 2023-08-28 DIAGNOSIS — Z90710 Acquired absence of both cervix and uterus: Secondary | ICD-10-CM

## 2023-08-28 DIAGNOSIS — Z7982 Long term (current) use of aspirin: Secondary | ICD-10-CM

## 2023-08-28 DIAGNOSIS — Z820 Family history of epilepsy and other diseases of the nervous system: Secondary | ICD-10-CM

## 2023-08-28 DIAGNOSIS — R823 Hemoglobinuria: Secondary | ICD-10-CM | POA: Diagnosis present

## 2023-08-28 DIAGNOSIS — I252 Old myocardial infarction: Secondary | ICD-10-CM

## 2023-08-28 DIAGNOSIS — J9611 Chronic respiratory failure with hypoxia: Secondary | ICD-10-CM | POA: Diagnosis present

## 2023-08-28 DIAGNOSIS — Z923 Personal history of irradiation: Secondary | ICD-10-CM

## 2023-08-28 DIAGNOSIS — Z7952 Long term (current) use of systemic steroids: Secondary | ICD-10-CM

## 2023-08-28 DIAGNOSIS — Z803 Family history of malignant neoplasm of breast: Secondary | ICD-10-CM

## 2023-08-28 DIAGNOSIS — Z87440 Personal history of urinary (tract) infections: Secondary | ICD-10-CM

## 2023-08-28 DIAGNOSIS — S0083XA Contusion of other part of head, initial encounter: Secondary | ICD-10-CM | POA: Diagnosis present

## 2023-08-28 DIAGNOSIS — S0003XA Contusion of scalp, initial encounter: Secondary | ICD-10-CM | POA: Diagnosis present

## 2023-08-28 DIAGNOSIS — E876 Hypokalemia: Secondary | ICD-10-CM | POA: Diagnosis present

## 2023-08-28 DIAGNOSIS — Z87891 Personal history of nicotine dependence: Secondary | ICD-10-CM

## 2023-08-28 DIAGNOSIS — E871 Hypo-osmolality and hyponatremia: Secondary | ICD-10-CM | POA: Diagnosis present

## 2023-08-28 DIAGNOSIS — I5032 Chronic diastolic (congestive) heart failure: Secondary | ICD-10-CM | POA: Diagnosis present

## 2023-08-28 DIAGNOSIS — W1830XA Fall on same level, unspecified, initial encounter: Secondary | ICD-10-CM | POA: Diagnosis present

## 2023-08-28 DIAGNOSIS — I2510 Atherosclerotic heart disease of native coronary artery without angina pectoris: Secondary | ICD-10-CM | POA: Diagnosis present

## 2023-08-28 DIAGNOSIS — E860 Dehydration: Secondary | ICD-10-CM | POA: Diagnosis present

## 2023-08-28 DIAGNOSIS — G40909 Epilepsy, unspecified, not intractable, without status epilepticus: Secondary | ICD-10-CM | POA: Diagnosis present

## 2023-08-28 DIAGNOSIS — N390 Urinary tract infection, site not specified: Secondary | ICD-10-CM | POA: Diagnosis present

## 2023-08-28 DIAGNOSIS — R55 Syncope and collapse: Secondary | ICD-10-CM

## 2023-08-28 DIAGNOSIS — Z885 Allergy status to narcotic agent status: Secondary | ICD-10-CM

## 2023-08-28 DIAGNOSIS — Z950 Presence of cardiac pacemaker: Secondary | ICD-10-CM

## 2023-08-28 DIAGNOSIS — I480 Paroxysmal atrial fibrillation: Secondary | ICD-10-CM | POA: Diagnosis present

## 2023-08-28 DIAGNOSIS — Z8049 Family history of malignant neoplasm of other genital organs: Secondary | ICD-10-CM

## 2023-08-28 DIAGNOSIS — Z853 Personal history of malignant neoplasm of breast: Secondary | ICD-10-CM

## 2023-08-28 DIAGNOSIS — M48061 Spinal stenosis, lumbar region without neurogenic claudication: Secondary | ICD-10-CM | POA: Diagnosis present

## 2023-08-28 DIAGNOSIS — S0990XA Unspecified injury of head, initial encounter: Secondary | ICD-10-CM

## 2023-08-28 DIAGNOSIS — Y92019 Unspecified place in single-family (private) house as the place of occurrence of the external cause: Secondary | ICD-10-CM

## 2023-08-28 DIAGNOSIS — B9620 Unspecified Escherichia coli [E. coli] as the cause of diseases classified elsewhere: Secondary | ICD-10-CM | POA: Diagnosis present

## 2023-08-28 DIAGNOSIS — Z8249 Family history of ischemic heart disease and other diseases of the circulatory system: Secondary | ICD-10-CM

## 2023-08-28 DIAGNOSIS — R110 Nausea: Secondary | ICD-10-CM | POA: Diagnosis present

## 2023-08-28 DIAGNOSIS — Z818 Family history of other mental and behavioral disorders: Secondary | ICD-10-CM

## 2023-08-28 DIAGNOSIS — K219 Gastro-esophageal reflux disease without esophagitis: Secondary | ICD-10-CM | POA: Diagnosis present

## 2023-08-28 LAB — URINALYSIS, ROUTINE W REFLEX MICROSCOPIC
Bilirubin Urine: NEGATIVE
Glucose, UA: NEGATIVE mg/dL
Ketones, ur: NEGATIVE mg/dL
Leukocytes,Ua: NEGATIVE
Nitrite: NEGATIVE
Protein, ur: NEGATIVE mg/dL
Specific Gravity, Urine: 1.005 (ref 1.005–1.030)
pH: 6 (ref 5.0–8.0)

## 2023-08-28 LAB — TROPONIN I (HIGH SENSITIVITY)
Troponin I (High Sensitivity): 11 ng/L (ref <18)
Troponin I (High Sensitivity): 11 ng/L (ref <18)

## 2023-08-28 LAB — COMPREHENSIVE METABOLIC PANEL WITH GFR
ALT: 12 U/L (ref 0–44)
AST: 20 U/L (ref 15–41)
Albumin: 3.5 g/dL (ref 3.5–5.0)
Alkaline Phosphatase: 79 U/L (ref 38–126)
Anion gap: 14 (ref 5–15)
BUN: 23 mg/dL (ref 8–23)
CO2: 27 mmol/L (ref 22–32)
Calcium: 9.2 mg/dL (ref 8.9–10.3)
Chloride: 97 mmol/L — ABNORMAL LOW (ref 98–111)
Creatinine, Ser: 1.02 mg/dL — ABNORMAL HIGH (ref 0.44–1.00)
GFR, Estimated: 57 mL/min — ABNORMAL LOW (ref >60)
Glucose, Bld: 116 mg/dL — ABNORMAL HIGH (ref 70–99)
Potassium: 4 mmol/L (ref 3.5–5.1)
Sodium: 138 mmol/L (ref 135–145)
Total Bilirubin: 1.3 mg/dL — ABNORMAL HIGH (ref 0.0–1.2)
Total Protein: 7.3 g/dL (ref 6.5–8.1)

## 2023-08-28 LAB — CBC
HCT: 36.8 % (ref 36.0–46.0)
Hemoglobin: 12.1 g/dL (ref 12.0–15.0)
MCH: 29.9 pg (ref 26.0–34.0)
MCHC: 32.9 g/dL (ref 30.0–36.0)
MCV: 90.9 fL (ref 80.0–100.0)
Platelets: 322 K/uL (ref 150–400)
RBC: 4.05 MIL/uL (ref 3.87–5.11)
RDW: 12.4 % (ref 11.5–15.5)
WBC: 7.2 K/uL (ref 4.0–10.5)
nRBC: 0 % (ref 0.0–0.2)

## 2023-08-28 MED ORDER — APIXABAN 5 MG PO TABS
5.0000 mg | ORAL_TABLET | Freq: Two times a day (BID) | ORAL | Status: DC
  Administered 2023-08-29 – 2023-09-11 (×28): 5 mg via ORAL
  Filled 2023-08-28 (×28): qty 1

## 2023-08-28 MED ORDER — SENNOSIDES-DOCUSATE SODIUM 8.6-50 MG PO TABS: 1.0000 | ORAL_TABLET | Freq: Every evening | ORAL | Status: DC | PRN

## 2023-08-28 MED ORDER — LEVETIRACETAM 250 MG PO TABS
250.0000 mg | ORAL_TABLET | Freq: Two times a day (BID) | ORAL | Status: DC
  Administered 2023-08-29 – 2023-09-11 (×28): 250 mg via ORAL
  Filled 2023-08-28 (×30): qty 1

## 2023-08-28 MED ORDER — ONDANSETRON HCL 4 MG PO TABS
4.0000 mg | ORAL_TABLET | Freq: Four times a day (QID) | ORAL | Status: DC | PRN
  Administered 2023-09-04: 4 mg via ORAL
  Filled 2023-08-28: qty 1

## 2023-08-28 MED ORDER — SODIUM CHLORIDE 0.9 % IV BOLUS: 1000.0000 mL | Freq: Once | INTRAVENOUS | Status: DC

## 2023-08-28 MED ORDER — ASPIRIN 81 MG PO TBEC
81.0000 mg | DELAYED_RELEASE_TABLET | Freq: Every day | ORAL | Status: DC
Start: 2023-08-29 — End: 2023-08-30
  Administered 2023-08-29: 81 mg via ORAL
  Filled 2023-08-28: qty 1

## 2023-08-28 MED ORDER — ARFORMOTEROL TARTRATE 15 MCG/2ML IN NEBU
15.0000 ug | INHALATION_SOLUTION | Freq: Two times a day (BID) | RESPIRATORY_TRACT | Status: DC
  Filled 2023-08-28 (×2): qty 2

## 2023-08-28 MED ORDER — PANTOPRAZOLE SODIUM 40 MG PO TBEC
40.0000 mg | DELAYED_RELEASE_TABLET | Freq: Every day | ORAL | Status: DC
  Administered 2023-08-29 – 2023-09-11 (×14): 40 mg via ORAL
  Filled 2023-08-28 (×14): qty 1

## 2023-08-28 MED ORDER — UMECLIDINIUM BROMIDE 62.5 MCG/ACT IN AEPB
1.0000 | INHALATION_SPRAY | Freq: Every day | RESPIRATORY_TRACT | Status: DC
  Administered 2023-08-31 – 2023-09-11 (×12): 1 via RESPIRATORY_TRACT
  Filled 2023-08-28 (×2): qty 7

## 2023-08-28 MED ORDER — SODIUM CHLORIDE 0.9 % IV SOLN: INTRAVENOUS | Status: AC

## 2023-08-28 MED ORDER — ACETAMINOPHEN 325 MG PO TABS
650.0000 mg | ORAL_TABLET | Freq: Four times a day (QID) | ORAL | Status: DC | PRN
  Administered 2023-08-28 – 2023-09-06 (×15): 650 mg via ORAL
  Filled 2023-08-28 (×14): qty 2

## 2023-08-28 MED ORDER — IOHEXOL 350 MG/ML SOLN
75.0000 mL | Freq: Once | INTRAVENOUS | Status: AC | PRN
  Administered 2023-08-28: 75 mL via INTRAVENOUS

## 2023-08-28 MED ORDER — ACETAMINOPHEN 650 MG RE SUPP: 650.0000 mg | Freq: Four times a day (QID) | RECTAL | Status: DC | PRN

## 2023-08-28 MED ORDER — ORAL CARE MOUTH RINSE: 15.0000 mL | OROMUCOSAL | Status: DC | PRN

## 2023-08-28 MED ORDER — ATORVASTATIN CALCIUM 20 MG PO TABS
40.0000 mg | ORAL_TABLET | Freq: Every day | ORAL | Status: DC
  Administered 2023-08-29 – 2023-09-11 (×14): 40 mg via ORAL
  Filled 2023-08-28 (×14): qty 2

## 2023-08-28 MED ORDER — ARFORMOTEROL TARTRATE 15 MCG/2ML IN NEBU
15.0000 ug | INHALATION_SOLUTION | Freq: Two times a day (BID) | RESPIRATORY_TRACT | Status: DC
  Administered 2023-08-29 – 2023-09-11 (×26): 15 ug via RESPIRATORY_TRACT
  Filled 2023-08-28 (×28): qty 2

## 2023-08-28 MED ORDER — SODIUM CHLORIDE 0.9 % IV BOLUS
1000.0000 mL | Freq: Once | INTRAVENOUS | Status: AC
  Administered 2023-08-28: 1000 mL via INTRAVENOUS

## 2023-08-28 MED ORDER — ONDANSETRON HCL 4 MG/2ML IJ SOLN: 4.0000 mg | Freq: Four times a day (QID) | INTRAMUSCULAR | Status: DC | PRN

## 2023-08-28 MED ORDER — UMECLIDINIUM BROMIDE 62.5 MCG/ACT IN AEPB
1.0000 | INHALATION_SPRAY | Freq: Every day | RESPIRATORY_TRACT | Status: DC
  Filled 2023-08-28: qty 7

## 2023-08-28 NOTE — ED Notes (Signed)
 Lab coming to draw pt labs due to difficult stick.

## 2023-08-28 NOTE — ED Notes (Signed)
 Attempted to draw labs on pt however unsuccessful. Will call lab for same.

## 2023-08-28 NOTE — ED Notes (Signed)
 Pt back from CT at this time and denies any needs. Pt comfortable and resting.

## 2023-08-28 NOTE — ED Notes (Signed)
 Pt is CAOx4, breathing normally, and normal in color. Pt complaining of body aches and generalized pain following a fall she had prior to coming to the ED. Pt's husband in room and reports that he was in the garage and heard the pt fall. He reports that the pt passed out and came to on the floor after falling. Pt did hit her head and has a hematoma to forehead.

## 2023-08-28 NOTE — ED Notes (Signed)
 Order made for IV team consult due to difficult stick. Pt made aware of delays. Pt denies any needs at this time.

## 2023-08-28 NOTE — ED Provider Notes (Signed)
 [facility] Provider Note    Event Date/Time   First MD Initiated Contact with Patient 08/28/23 1328     (approximate)   History   Loss of Consciousness and Hypotension   HPI  Diana Cook is a 73 y.o. female past medical history significant for lung cancer on radiation, COPD on chronic 4 L of home oxygen , CAD, presents to the emergency department following a fall.  Patient states that she has had a couple of falls over the past couple of weeks for generalized weakness and fatigue.  States that she has difficulty with falls that have just been happening since her last radiation.  States that her last fall was approximately 1 hour prior to arrival.  Does endorse hitting her head but no loss of consciousness.  Denies chest pain or shortness of breath.  Does state that she has just been feeling very weak and fatigued.  Denies dysuria, urinary urgency or frequency.  No blood in her stool.  No recent changes to home medications.     Physical Exam   Triage Vital Signs: ED Triage Vitals  Encounter Vitals Group     BP 08/28/23 1327 (!) 71/46     Girls Systolic BP Percentile --      Girls Diastolic BP Percentile --      Boys Systolic BP Percentile --      Boys Diastolic BP Percentile --      Pulse Rate 08/28/23 1327 92     Resp 08/28/23 1327 (!) 24     Temp --      Temp src --      SpO2 08/28/23 1327 (!) 83 %     Weight 08/28/23 1334 200 lb (90.7 kg)     Height 08/28/23 1334 5' 9 (1.753 m)     Head Circumference --      Peak Flow --      Pain Score --      Pain Loc --      Pain Education --      Exclude from Growth Chart --     Most recent vital signs: Vitals:   08/28/23 1500 08/28/23 1508  BP: 112/61   Pulse: 80   Resp: 12   Temp:  97.9 F (36.6 C)  SpO2: 98%     Physical Exam Constitutional:      Appearance: She is well-developed.  HENT:     Head:     Comments: Hematoma to the left forehead Eyes:     Extraocular Movements:  Extraocular movements intact.     Conjunctiva/sclera: Conjunctivae normal.     Pupils: Pupils are equal, round, and reactive to light.  Cardiovascular:     Rate and Rhythm: Regular rhythm.     Pulses: Normal pulses.  Pulmonary:     Effort: No respiratory distress.  Abdominal:     General: There is no distension.     Tenderness: There is no abdominal tenderness. There is no guarding.  Musculoskeletal:        General: Normal range of motion.     Cervical back: Normal range of motion and neck supple. No tenderness.     Right lower leg: No edema.     Left lower leg: No edema.     Comments: No midline thoracic or lumbar tenderness to palpation.  No pain with range of motion to bilateral upper or lower extremities  Skin:    General: Skin is warm.  Capillary Refill: Capillary refill takes less than 2 seconds.  Neurological:     General: No focal deficit present.     Mental Status: She is alert. Mental status is at baseline.  Psychiatric:        Mood and Affect: Mood normal.     IMPRESSION / MDM / ASSESSMENT AND PLAN / ED COURSE  I reviewed the triage vital signs and the nursing notes.  On chart review patient was evaluated a couple of days ago in the emergency department following a fall  On arrival patient's blood pressure was significantly low however likely an error and repeat blood pressure 120/75.  Currently on 6 L nasal cannula given that she was hypoxic to 87% on her chronic pulm 4 L.  Afebrile.  Differential diagnosis including dehydration, anemia, GI bleed, intracranial hemorrhage, infectious process, side effect of radiation.  No chest pain or shortness of breath have a lower suspicion for pulmonary embolism and is anticoagulated  EKG  I, Clotilda Punter, the attending physician, personally viewed and interpreted this ECG.  EKG with signs of LVH.  Paced rhythm.   No significant change when compared to prior EKG.  No tachycardic or bradycardic dysrhythmias while on  cardiac telemetry.  RADIOLOGY I independently reviewed imaging, my interpretation of imaging: CT scan of the head with no signs of intracranial hemorrhage  CT scan of the head read as small left frontal scalp hematoma.  Cervical spine read as no acute findings.  LABS (all labs ordered are listed, but only abnormal results are displayed) Labs interpreted as -    Labs Reviewed  URINALYSIS, ROUTINE W REFLEX MICROSCOPIC - Abnormal; Notable for the following components:      Result Value   Color, Urine STRAW (*)    APPearance CLEAR (*)    Hgb urine dipstick SMALL (*)    Bacteria, UA RARE (*)    All other components within normal limits  CBC  COMPREHENSIVE METABOLIC PANEL WITH GFR  CBG MONITORING, ED  TROPONIN I (HIGH SENSITIVITY)  TROPONIN I (HIGH SENSITIVITY)     MDM  Urine with no signs of urinary tract infection.  Difficult IV stick.  Plan for lab work and reevaluation.  Will obtain orthostatic blood pressures.  Fluid bolus.  Care transferred to incoming provider.     PROCEDURES:  Critical Care performed: No  Procedures  Patient's presentation is most consistent with acute presentation with potential threat to life or bodily function.   MEDICATIONS ORDERED IN ED: Medications - No data to display  FINAL CLINICAL IMPRESSION(S) / ED DIAGNOSES   Final diagnoses:  Fall, initial encounter  Injury of head, initial encounter     Rx / DC Orders   ED Discharge Orders     None        Note:  This document was prepared using Dragon voice recognition software and may include unintentional dictation errors.   Punter Clotilda, MD 08/28/23 1616

## 2023-08-28 NOTE — H&P (Incomplete)
 History and Physical  Diana Cook FMW:[id number] DOB: 1951-01-12 DOA: 08/28/2023  PCP: Randeen Laine LABOR, MD   Chief Complaint: Syncope  HPI: Diana Cook is a 73 y.o. female with medical history significant for COPD, chronic respiratory failure on 4 L [place], CAD/MI, complete heart block s/p PPM, carotic stenosis s/p stenting on Eliquis , seizure disorder, lumbar radiculopathy, osteoarthritis, breast cancer s/p surgery and radiation therapy, HFpEF, HLD and GERD who presented to the ED after syncope episode.  ED Course: Initial vitals show temp 97.9, RR 18, HR 78, BP 117/46, SpO2 94% on 6 L. Positive orthostatic vitals. Initial labs significant for sodium 138, K+ 4.0, glucose 116, creatinine 1.02, bilirubin 1.3, WBC 7.2, Hgb 12.1, normal troponin, hemoglobinuria but no signs of infection. EKG shows ventricularly paced rhythm with sinus rhythm and LVH. CXR shows known scattered pulmonary nodules but no active disease. CT head shows small left frontal scalp hematoma but no intracranial abnormalities. CT C-spine with no acute fracture or dislocation. CTA chest PE study negative for PE. Pt received IV NS 1 L bolus. TRH was consulted for admission.  Review of Systems: Please see HPI for pertinent positives and negatives. A complete 10 system review of systems are otherwise negative.  Past Medical History:  Diagnosis Date   Allergic rhinitis    Arthritis    Asthma    as a child, mild now   Breast cancer (HCC) 12/2015   right breast cancer, lumpectomy and mammosite    Colon polyps    colonoscopy 7/08, tubular adenoma   Complete heart block (HCC) 09/2011   s/p PPM implanted in [facility]   COPD (chronic obstructive pulmonary disease) (HCC)    Myocardial infarction (HCC) 2011   Osteopenia 10/2015   Pacemaker    2011   Paroxysmal atrial fibrillation (HCC) 03/2021   Incidentally detected on pacemaker interrogation   Personal history of radiation therapy 2017   right breast ca, mammosite placed    Rotator cuff tear, right 02/2022   Had an MRI in [place] I have 2 tears.   Seizure disorder (HCC)    Seizures (HCC)    first one was when she was 73 years old    Small bowel obstruction (HCC)    1988 and 2002   Tobacco abuse    Past Surgical History:  Procedure Laterality Date   ABDOMINAL HYSTERECTOMY     BREAST BIOPSY Right 2007   benign inflammatory changes, mass due to underwire bra   BREAST BIOPSY Left 01/02/2016   columnar cell changes without atypical hyperplasia.   BREAST BIOPSY Right 12/06/2015   rt breast mass 10:00, bx done at Dr. Gilberto office, invasive ductal carcinoma   BREAST EXCISIONAL BIOPSY Left 01/02/2016   COLUMNAR CELL CHANGE AND HYPERPLASIA ASSOCIATED WITH LUMINAL AND STROMAL CALCIFICATIONS   BREAST LUMPECTOMY Right 01/02/2016   invasive mammary carcinoma, clear margins, negative LN   BREAST LUMPECTOMY WITH SENTINEL LYMPH NODE BIOPSY Right 01/02/2016   pT1c, N0; ER/ PR 100%; Her 2 neu not over expressed: BREAST LUMPECTOMY WITH SENTINEL LYMPH NODE BX;  Surgeon: Reyes LELON Cota, MD;  Location: [facility] ORS;  Service: General;  Laterality: Right;   CARDIAC CATHETERIZATION     CAROTID PTA/STENT INTERVENTION Right 04/29/2022   Procedure: CAROTID PTA/STENT INTERVENTION;  Surgeon: Jama Cordella MATSU, MD;  Location: [facility] INVASIVE CV LAB;  Service: Cardiovascular;  Laterality: Right;   CATARACT EXTRACTION Bilateral    COLONOSCOPY  10/2015   Dr Teressa   COLONOSCOPY WITH PROPOFOL  N/A  08/19/2018   Procedure: COLONOSCOPY WITH PROPOFOL ;  Surgeon: Janalyn Keene NOVAK, MD;  Location: [facility] ENDOSCOPY;  Service: Endoscopy;  Laterality: N/A;   ESOPHAGOGASTRODUODENOSCOPY (EGD) WITH PROPOFOL  N/A 08/19/2018   Procedure: ESOPHAGOGASTRODUODENOSCOPY (EGD) WITH PROPOFOL ;  Surgeon: Janalyn Keene NOVAK, MD;  Location: [facility] ENDOSCOPY;  Service: Endoscopy;  Laterality: N/A;   EXPLORATORY LAPAROTOMY  01/25/2001   Exploratory laparotomy, lysis of adhesions, identification of internal hernia  secondary to omental adhesion. Prolonged postoperative ileus.   gyn surgery  1993   hysterectomy- form endometriosis   LAPAROSCOPY     PACEMAKER INSERTION  10/24/11   [employer] Advantio dual chamber PPM implanted by Dr Ronny at [facility] in [name]   PPM GENERATOR CHANGEOUT N/A 12/05/2019   Procedure: PPM GENERATOR HERMA;  Surgeon: Fernande Elspeth BROCKS, MD;  Location: [facility] INVASIVE CV LAB;  Service: Cardiovascular;  Laterality: N/A;   RIGHT/LEFT HEART CATH AND CORONARY ANGIOGRAPHY Bilateral 07/19/2019   Procedure: RIGHT/LEFT HEART CATH AND CORONARY ANGIOGRAPHY;  Surgeon: Mady Bruckner, MD;  Location: [facility] INVASIVE CV LAB;  Service: Cardiovascular;  Laterality: Bilateral;   TEMPORARY PACEMAKER N/A 12/05/2019   Procedure: TEMPORARY PACEMAKER;  Surgeon: Fernande Elspeth BROCKS, MD;  Location: [facility] INVASIVE CV LAB;  Service: Cardiovascular;  Laterality: N/A;   Social History:  reports that she quit smoking about 12 years ago. Her smoking use included cigarettes. She started smoking about 54 years ago. She has a 63 pack-year smoking history. She has never used smokeless tobacco. She reports current alcohol use of about 1.0 - 2.0 standard drink of alcohol per week. She reports that she does not use drugs.  Allergies  Allergen Reactions   Codeine Nausea And Vomiting   Morphine  And Codeine Nausea Only    Family History  Problem Relation Age of Onset   Stroke Mother    Heart disease Mother 46       MI and CABG   Dementia Mother    Coronary artery disease Father    Parkinsonism Father    Breast cancer Sister    Cancer - Cervical Daughter 76       died Mar 23, 2023   Heart attack Brother 17   Colon cancer Neg Hx      Prior to Admission medications   Medication Sig Start Date End Date Taking? Authorizing Provider  acetaminophen  (TYLENOL ) 500 MG tablet Take 500-1,000 mg by mouth every 6 (six) hours as needed for mild pain or headache.     [provider]  albuterol   (VENTOLIN  HFA) 108 (90 Base) MCG/ACT inhaler INHALE 2 PUFFS BY MOUTH EVERY 4 HOURS AS NEEDED FOR WHEEZING OR SHORTNESS OF BREATH 05/25/23   Tamea Dedra CROME, MD  apixaban  (ELIQUIS ) 5 MG TABS tablet Take 1 tablet (5 mg total) by mouth 2 (two) times daily. 06/17/23   Fernande Elspeth BROCKS, MD  aspirin  EC 81 MG tablet Take 1 tablet (81 mg total) by mouth daily. 12/05/22   Schnier, Cordella MATSU, MD  atorvastatin  (LIPITOR) 40 MG tablet Take 1 tablet by mouth once daily 02/13/23   Fernande Elspeth BROCKS, MD  benzonatate  (TESSALON ) 200 MG capsule Take 1 capsule (200 mg total) by mouth 2 (two) times daily as needed for cough. 03/03/23   Bedsole, Amy E, MD  Cholecalciferol  (VITAMIN D3) 50 MCG (2000 UT) capsule Take 2,000 Units by mouth daily.    [provider]  FLUoxetine  (PROZAC ) 20 MG capsule Take 1 capsule by mouth once daily 06/23/23   Tower, Laine LABOR, MD  furosemide  (LASIX ) 40 MG tablet Take 1 tablet (40 mg total) by mouth daily as needed (swelling, wt gain or needing more oxygen ). 09/09/21   Fausto Sor A, DO  gabapentin  (NEURONTIN ) 100 MG capsule Take 100 mg by mouth daily as needed. 03/02/23   [provider]  levETIRAcetam  (KEPPRA ) 250 MG tablet Take 250 mg by mouth 2 (two) times daily.  11/02/19   [provider]  nitrofurantoin , macrocrystal-monohydrate, (MACROBID ) 100 MG capsule Take 1 capsule (100 mg total) by mouth 2 (two) times daily. 05/11/23   Tower, Laine LABOR, MD  OXYGEN  Inhale 4 L into the lungs continuous.    [provider]  pantoprazole  (PROTONIX ) 40 MG tablet Take 1 tablet (40 mg total) by mouth daily. 11/20/22 11/20/23  Tamea Dedra CROME, MD  Tiotropium Bromide-Olodaterol (STIOLTO RESPIMAT ) 2.5-2.5 MCG/ACT AERS INHALE 2 PUFFS BY MOUTH ONCE DAILY 05/25/23   Tamea Dedra CROME, MD  zolpidem  (AMBIEN ) 10 MG tablet TAKE 1/2 TO 1 (ONE-HALF TO ONE) TABLET BY MOUTH AT BEDTIME AS NEEDED FOR SLEEP 06/23/23   Tower, Laine LABOR, MD    Physical Exam: BP 139/73   Pulse 71   Temp 98.1 F  (36.7 C) (Oral)   Resp 18   Ht 5' 9 (1.753 m)   Wt 90.7 kg   SpO2 95%   BMI 29.53 kg/m  General: Pleasant, well-appearing *** laying in bed. No acute distress. HEENT: [place]/AT. Anicteric sclera CV: RRR. No murmurs, rubs, or gallops. No LE edema Pulmonary: Lungs CTAB. Normal effort. No wheezing or rales. Abdominal: Soft, nontender, nondistended. Normal bowel sounds. Extremities: Palpable radial and DP pulses. Normal ROM. Skin: Warm and dry. No obvious rash or lesions. Neuro: A&Ox3. Moves all extremities. Normal sensation to light touch. No focal deficit. Psych: Normal mood and affect          Labs on Admission:  Basic Metabolic Panel: Recent Labs  Lab 08/26/23 1340 08/28/23 1544  NA 136 138  K 3.7 4.0  CL 99 97*  CO2 26 27  GLUCOSE 100* 116*  BUN 24* 23  CREATININE 1.22* 1.02*  CALCIUM  9.0 9.2   Liver Function Tests: Recent Labs  Lab 08/28/23 1544  AST 20  ALT 12  ALKPHOS 79  BILITOT 1.3*  PROT 7.3  ALBUMIN 3.5   No results for input(s): LIPASE, AMYLASE in the last 168 hours. No results for input(s): AMMONIA in the last 168 hours. CBC: Recent Labs  Lab 08/26/23 1340 08/28/23 1352  WBC 8.0 7.2  HGB 12.2 12.1  HCT 37.3 36.8  MCV 91.4 90.9  PLT 284 322   Cardiac Enzymes: No results for input(s): CKTOTAL, CKMB, CKMBINDEX, TROPONINI in the last 168 hours. BNP (last 3 results) No results for input(s): BNP in the last 8760 hours.  ProBNP (last 3 results) No results for input(s): PROBNP in the last 8760 hours.  CBG: No results for input(s): GLUCAP in the last 168 hours.  Radiological Exams on Admission: CT Angio Chest PE W and/or Wo Contrast Result Date: 08/28/2023 CLINICAL DATA:  Hypoxia.  History of lung cancer. EXAM: CT ANGIOGRAPHY CHEST WITH CONTRAST TECHNIQUE: Multidetector CT imaging of the chest was performed using the standard protocol during bolus administration of intravenous contrast. Multiplanar CT image reconstructions and  MIPs were obtained to evaluate the vascular anatomy. RADIATION DOSE REDUCTION: This exam was performed according to the departmental dose-optimization program which includes automated exposure control, adjustment of the mA and/or kV according to patient size and/or use of iterative reconstruction technique.  CONTRAST:  75mL OMNIPAQUE  IOHEXOL  350 MG/ML SOLN COMPARISON:  06/24/2023 FINDINGS: Cardiovascular: Left pacer in place with leads in the right atrium and right ventricle. Heart is normal size. Aorta is normal caliber. Coronary artery and aortic atherosclerosis. No filling defects in the pulmonary arteries to suggest pulmonary emboli. Mediastinum/Nodes: No mediastinal, hilar, or axillary adenopathy. Trachea and esophagus are unremarkable. Thyroid  unremarkable. Lungs/Pleura: Moderate centrilobular emphysema. Post radiation changes/scarring in the right mid lung, stable. Left lower lobe pulmonary nodule measures up to 10 mm, stable since prior study. Lingular nodule measures 3 mm on image 92, stable. 2 mm right lower lobe pulmonary nodule on image 94 is stable. 5 mm medial left upper lobe nodule on image 48. In retrospect, this was present on prior study and is stable. 7 mm right upper lobe pulmonary nodule on image 56 is stable since prior study. No new or enlarging pulmonary nodules. Dependent linear densities in the lower lobes compatible with dependent atelectasis. No effusions. Upper Abdomen: No acute findings Musculoskeletal: Chest wall soft tissues are unremarkable. No acute bony abnormality. Review of the MIP images confirms the above findings. IMPRESSION: No evidence of pulmonary embolus. Post treatment changes in the right mid lung. Scattered bilateral pulmonary nodules are stable since prior study. Coronary artery disease. Aortic Atherosclerosis (ICD10-I70.0) and Emphysema (ICD10-J43.9). Electronically Signed   By: Franky Crease M.D.   On: 08/28/2023 18:52   DG Chest Portable 1 View Result Date:  08/28/2023 CLINICAL DATA:  Hypoxia, syncopal episode. EXAM: PORTABLE CHEST 1 VIEW COMPARISON:  08/26/2023, 07/12/2023. FINDINGS: The heart size and mediastinal contours are stable. There is atherosclerotic calcification of the aorta. Mild atelectasis is noted at the left lung base. There is stable linear scarring or atelectasis in the mid right lung. No effusion or pneumothorax is seen. Scattered nodular densities are noted in the lungs measuring up to 9 mm. A dual lead pacemaker device is present over the left chest. No acute osseous abnormality. IMPRESSION: 1. No active disease. 2. Mild atelectasis or scarring at the left lung base and mid right lung. 3. Scattered pulmonary nodules, recently evaluated with PET-CT. Electronically Signed   By: Leita Birmingham M.D.   On: 08/28/2023 16:46   CT Head Wo Contrast Result Date: 08/28/2023 CLINICAL DATA:  Head trauma, syncopal episode 1 hour ago. EXAM: CT HEAD WITHOUT CONTRAST TECHNIQUE: Contiguous axial images were obtained from the base of the skull through the vertex without intravenous contrast. RADIATION DOSE REDUCTION: This exam was performed according to the departmental dose-optimization program which includes automated exposure control, adjustment of the mA and/or kV according to patient size and/or use of iterative reconstruction technique. COMPARISON:  CT head 02/12/2022. FINDINGS: Brain: No acute intracranial hemorrhage. No CT evidence of acute infarct. Nonspecific hypoattenuation in the periventricular and subcortical white matter favored to reflect chronic microvascular ischemic changes. Remote lacunar infarct in the basal ganglia. No edema, mass effect, or midline shift. The basilar cisterns are patent. Ventricles: The ventricles are normal. Vascular: No hyperdense vessel or unexpected calcification. Skull: No acute or aggressive finding. Orbits: Orbits are symmetric. Sinuses: The visualized paranasal sinuses are clear. Other: Small hematoma in the left  frontal scalp. Mastoid air cells are clear. IMPRESSION: No CT evidence of acute intracranial abnormality. Small left frontal scalp hematoma. Chronic microvascular ischemic changes. Remote lacunar infarct in the left basal ganglia. Electronically Signed   By: Donnice Mania M.D.   On: 08/28/2023 15:05   CT Cervical Spine Wo Contrast Result Date: 08/28/2023 CLINICAL DATA:  Neck  trauma.  Syncopal episode. EXAM: CT CERVICAL SPINE WITHOUT CONTRAST TECHNIQUE: Multidetector CT imaging of the cervical spine was performed without intravenous contrast. Multiplanar CT image reconstructions were also generated. RADIATION DOSE REDUCTION: This exam was performed according to the departmental dose-optimization program which includes automated exposure control, adjustment of the mA and/or kV according to patient size and/or use of iterative reconstruction technique. COMPARISON:  Cervical spine radiographs 02/15/2020. Cervical MRI 03/28/2020. FINDINGS: Alignment: Chronic anterolisthesis at C4-5, similar to prior imaging. Skull base and vertebrae: No evidence of acute cervical spine fracture or traumatic subluxation. Soft tissues and spinal canal: No prevertebral fluid or swelling. No visible canal hematoma. Disc levels: Chronic multilevel spondylosis with loss of disc height, uncinate spurring and facet hypertrophy. The spondylosis is most advanced at C5-6 and C6-7. No large disc herniation identified. Upper chest: Grossly stable 1.4 cm left thyroid  nodule for which no specific follow-up imaging is recommended. Clear lung apices. Other: Bilateral carotid atherosclerosis with right carotid stent. Advanced TMJ degenerative changes bilaterally. IMPRESSION: 1. No evidence of acute cervical spine fracture, traumatic subluxation or static signs of instability. 2. Chronic multilevel cervical spondylosis. Electronically Signed   By: Elsie Perone M.D.   On: 08/28/2023 15:03   Assessment/Plan Tona C Petion is a 73 y.o. female with  medical history significant for COPD, chronic respiratory failure on 4 L [place], CAD/MI, complete heart block s/p PPM, carotid stenosis s/p stenting on Eliquis , seizure disorder, lumbar radiculopathy, osteoarthritis, breast cancer s/p surgery and radiation therapy, HFpEF, HLD and GERD who presented to the ED after syncope episode found to have orthostatic hypotension.  # Dizziness # Orthostatic hypotension    # COPD # Chronic hypoxic respiratory failure - No respiratory distress, remains on baseline 4 L [place] - Continue home bronchodilator - As needed DuoNebs - Continue supplemental O2  # HFpEF  # Paroxysmal A-fib # Complete heart block s/p PPM - EKG shows atrial-sensed ventricularly-paced sinus rhythm - HR stable in the 70s to 80s - Continue Eliquis   # HLD # CAD/MI # Carotic stenosis s/p  - Continue atorvastatin , aspirin  and Eliquis   # Seizure disorder - Continue Keppra  -Seizure precautions  # GERD - Continue Protonix   #***  DVT prophylaxis: Lovenox      Code Status: Full Code  Consults called: None  Family Communication: Discussed admission with spouse at bedside  Severity of Illness: The appropriate patient status for this patient is OBSERVATION. Observation status is judged to be reasonable and necessary in order to provide the required intensity of service to ensure the patient's safety. The patient's presenting symptoms, physical exam findings, and initial radiographic and laboratory data in the context of their medical condition is felt to place them at decreased risk for further clinical deterioration. Furthermore, it is anticipated that the patient will be medically stable for discharge from the hospital within 2 midnights of admission.   Level of care: Telemetry Medical   This record has been created using Conservation officer, historic buildings. Errors have been sought and corrected, but may not always be located. Such creation errors do not reflect on the standard of  care.   Lou Claretta HERO, MD 08/28/2023, 8:58 PM [facility] Pager: [phone] Isaiah 41:10   If 7PM-7AM, please contact night-coverage [email or web address] Password [email or web address]

## 2023-08-28 NOTE — ED Triage Notes (Addendum)
 Pt to ED POV with husband for syncopal episode 1 hour ago. Pt wears 4L chronic, 82%, turned to 6L, slowly came up to 92%. Also hypotensive 71/46, says BP was low here 2 days ago when seen. Pt is mentating appropriately. Moving to room.   Temp not reading, tried 2 thermometers oral and axillary.   Pt has hematoma to L forehead. Pt has pacemaker.

## 2023-08-29 DIAGNOSIS — N390 Urinary tract infection, site not specified: Secondary | ICD-10-CM | POA: Diagnosis present

## 2023-08-29 DIAGNOSIS — Z7901 Long term (current) use of anticoagulants: Secondary | ICD-10-CM | POA: Diagnosis not present

## 2023-08-29 DIAGNOSIS — I5032 Chronic diastolic (congestive) heart failure: Secondary | ICD-10-CM | POA: Diagnosis present

## 2023-08-29 DIAGNOSIS — E871 Hypo-osmolality and hyponatremia: Secondary | ICD-10-CM | POA: Diagnosis present

## 2023-08-29 DIAGNOSIS — Z79899 Other long term (current) drug therapy: Secondary | ICD-10-CM | POA: Diagnosis not present

## 2023-08-29 DIAGNOSIS — I480 Paroxysmal atrial fibrillation: Secondary | ICD-10-CM | POA: Diagnosis present

## 2023-08-29 DIAGNOSIS — K219 Gastro-esophageal reflux disease without esophagitis: Secondary | ICD-10-CM | POA: Diagnosis present

## 2023-08-29 DIAGNOSIS — I951 Orthostatic hypotension: Secondary | ICD-10-CM | POA: Diagnosis present

## 2023-08-29 DIAGNOSIS — Z923 Personal history of irradiation: Secondary | ICD-10-CM | POA: Diagnosis not present

## 2023-08-29 DIAGNOSIS — Z8249 Family history of ischemic heart disease and other diseases of the circulatory system: Secondary | ICD-10-CM | POA: Diagnosis not present

## 2023-08-29 DIAGNOSIS — E860 Dehydration: Secondary | ICD-10-CM | POA: Diagnosis present

## 2023-08-29 DIAGNOSIS — I2510 Atherosclerotic heart disease of native coronary artery without angina pectoris: Secondary | ICD-10-CM | POA: Diagnosis present

## 2023-08-29 DIAGNOSIS — J9611 Chronic respiratory failure with hypoxia: Secondary | ICD-10-CM | POA: Diagnosis present

## 2023-08-29 DIAGNOSIS — W19XXXA Unspecified fall, initial encounter: Secondary | ICD-10-CM | POA: Diagnosis present

## 2023-08-29 DIAGNOSIS — R55 Syncope and collapse: Secondary | ICD-10-CM

## 2023-08-29 DIAGNOSIS — W1830XA Fall on same level, unspecified, initial encounter: Secondary | ICD-10-CM | POA: Diagnosis present

## 2023-08-29 DIAGNOSIS — S0003XA Contusion of scalp, initial encounter: Secondary | ICD-10-CM | POA: Diagnosis present

## 2023-08-29 DIAGNOSIS — E876 Hypokalemia: Secondary | ICD-10-CM | POA: Diagnosis present

## 2023-08-29 DIAGNOSIS — Z9981 Dependence on supplemental oxygen: Secondary | ICD-10-CM | POA: Diagnosis not present

## 2023-08-29 DIAGNOSIS — Z1611 Resistance to penicillins: Secondary | ICD-10-CM | POA: Diagnosis present

## 2023-08-29 DIAGNOSIS — Z87891 Personal history of nicotine dependence: Secondary | ICD-10-CM | POA: Diagnosis not present

## 2023-08-29 DIAGNOSIS — G40909 Epilepsy, unspecified, not intractable, without status epilepticus: Secondary | ICD-10-CM | POA: Diagnosis present

## 2023-08-29 DIAGNOSIS — B9620 Unspecified Escherichia coli [E. coli] as the cause of diseases classified elsewhere: Secondary | ICD-10-CM | POA: Diagnosis present

## 2023-08-29 DIAGNOSIS — Y92019 Unspecified place in single-family (private) house as the place of occurrence of the external cause: Secondary | ICD-10-CM | POA: Diagnosis not present

## 2023-08-29 DIAGNOSIS — Z7982 Long term (current) use of aspirin: Secondary | ICD-10-CM | POA: Diagnosis not present

## 2023-08-29 DIAGNOSIS — I442 Atrioventricular block, complete: Secondary | ICD-10-CM | POA: Diagnosis not present

## 2023-08-29 DIAGNOSIS — J449 Chronic obstructive pulmonary disease, unspecified: Secondary | ICD-10-CM | POA: Diagnosis present

## 2023-08-29 DIAGNOSIS — E785 Hyperlipidemia, unspecified: Secondary | ICD-10-CM | POA: Diagnosis present

## 2023-08-29 LAB — CBC
HCT: 34.6 % — ABNORMAL LOW (ref 36.0–46.0)
Hemoglobin: 11.2 g/dL — ABNORMAL LOW (ref 12.0–15.0)
MCH: 29.6 pg (ref 26.0–34.0)
MCHC: 32.4 g/dL (ref 30.0–36.0)
MCV: 91.5 fL (ref 80.0–100.0)
Platelets: 294 K/uL (ref 150–400)
RBC: 3.78 MIL/uL — ABNORMAL LOW (ref 3.87–5.11)
RDW: 12.6 % (ref 11.5–15.5)
WBC: 6.2 K/uL (ref 4.0–10.5)
nRBC: 0 % (ref 0.0–0.2)

## 2023-08-29 LAB — URINALYSIS, COMPLETE (UACMP) WITH MICROSCOPIC
Bilirubin Urine: NEGATIVE
Glucose, UA: NEGATIVE mg/dL
Ketones, ur: NEGATIVE mg/dL
Nitrite: NEGATIVE
Protein, ur: 100 mg/dL — AB
RBC / HPF: >50 RBC/hpf (ref 0–5)
Specific Gravity, Urine: 1.024 (ref 1.005–1.030)
WBC, UA: >50 WBC/hpf (ref 0–5)
pH: 6 (ref 5.0–8.0)

## 2023-08-29 LAB — COMPREHENSIVE METABOLIC PANEL WITH GFR
ALT: 11 U/L (ref 0–44)
AST: 17 U/L (ref 15–41)
Albumin: 3.3 g/dL — ABNORMAL LOW (ref 3.5–5.0)
Alkaline Phosphatase: 68 U/L (ref 38–126)
Anion gap: 10 (ref 5–15)
BUN: 23 mg/dL (ref 8–23)
CO2: 27 mmol/L (ref 22–32)
Calcium: 8.7 mg/dL — ABNORMAL LOW (ref 8.9–10.3)
Chloride: 101 mmol/L (ref 98–111)
Creatinine, Ser: 0.77 mg/dL (ref 0.44–1.00)
GFR, Estimated: >60 mL/min (ref >60)
Glucose, Bld: 104 mg/dL — ABNORMAL HIGH (ref 70–99)
Potassium: 2.9 mmol/L — ABNORMAL LOW (ref 3.5–5.1)
Sodium: 138 mmol/L (ref 135–145)
Total Bilirubin: 1 mg/dL (ref 0.0–1.2)
Total Protein: 6.9 g/dL (ref 6.5–8.1)

## 2023-08-29 LAB — BASIC METABOLIC PANEL WITH GFR
Anion gap: 9 (ref 5–15)
BUN: 24 mg/dL — ABNORMAL HIGH (ref 8–23)
CO2: 24 mmol/L (ref 22–32)
Calcium: 9.2 mg/dL (ref 8.9–10.3)
Chloride: 107 mmol/L (ref 98–111)
Creatinine, Ser: 0.98 mg/dL (ref 0.44–1.00)
GFR, Estimated: >60 mL/min (ref >60)
Glucose, Bld: 111 mg/dL — ABNORMAL HIGH (ref 70–99)
Potassium: 4.3 mmol/L (ref 3.5–5.1)
Sodium: 140 mmol/L (ref 135–145)

## 2023-08-29 LAB — PHOSPHORUS: Phosphorus: 3.3 mg/dL (ref 2.5–4.6)

## 2023-08-29 LAB — TSH: TSH: 0.783 u[IU]/mL (ref 0.350–4.500)

## 2023-08-29 LAB — CORTISOL-AM, BLOOD: Cortisol - AM: 20.2 ug/dL (ref 6.7–22.6)

## 2023-08-29 LAB — MAGNESIUM: Magnesium: 1.8 mg/dL (ref 1.7–2.4)

## 2023-08-29 MED ORDER — POTASSIUM CHLORIDE 10 MEQ/100ML IV SOLN
10.0000 meq | INTRAVENOUS | Status: AC
  Administered 2023-08-29 (×4): 10 meq via INTRAVENOUS
  Filled 2023-08-29 (×2): qty 100

## 2023-08-29 MED ORDER — POTASSIUM CHLORIDE CRYS ER 20 MEQ PO TBCR
40.0000 meq | EXTENDED_RELEASE_TABLET | Freq: Once | ORAL | Status: AC
Start: 2023-08-29 — End: 2023-08-29
  Administered 2023-08-29: 40 meq via ORAL
  Filled 2023-08-29: qty 2

## 2023-08-29 MED ORDER — IPRATROPIUM-ALBUTEROL 0.5-2.5 (3) MG/3ML IN SOLN
3.0000 mL | Freq: Four times a day (QID) | RESPIRATORY_TRACT | Status: DC | PRN
  Filled 2023-08-29: qty 3

## 2023-08-29 NOTE — Progress Notes (Signed)
 [facility] Progress Note  Patient: Diana Cook    FMW:[id number]  DOA: 08/28/2023     Date of Service: the patient was seen and examined on 08/29/2023  Chief Complaint  Patient presents with   Loss of Consciousness   Hypotension   Brief hospital course: Diana Cook is a 73 y.o. 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  Patient is having diarrhea for past 1 week, decreased p.o. intake that might be contributing to dehydration and orthostatic hypotension.  ED course: VS Positive orthostatics BMP chloride 97, glucose 116, creatinine 1.02 rest within normal range CBC within normal range  CT head:  small left frontal scalp hematoma but no intracranial abnormalities. CT C-spine with no acute fracture or dislocation.  CTA chest PE study negative for PE.    Pt received IV NS 1 L bolus. TR[facility] was consulted for admission.   Assessment and Plan:  # Orthostatic hypotension most likely due to dehydration secondary to diarrhea and decreased p.o. intake Patient presented with syncope and lightheadedness. Cortisol level within normal range, TSH 0.78 at lower end Continue fall precaution Hold Lasix  for now Held Prozac  as benazepril -TTE earlier this month shows normal EF with G1DD but no valvular abnormalities  -Carotid re-stenosis considered however recent U/S of her carotid shows normal flow hemodynamics  Continue to monitor on telemetry Check orthostatics every shift Continue fall precaution Follow PT/OT eval   # Hypokalemia, potassium repleted Monitor electrolytes and replete as needed.  # COPD # Chronic hypoxic respiratory failure - No respiratory  distress, remains on baseline 4 L [place] - Continue home bronchodilator - As needed DuoNebs - Continue supplemental O2   # HFpEF - TTE earlier this month shows normal EF with G1DD but no valvular abnormalities - Patient dry on exam - Hold as needed Lasix    # Paroxysmal A-fib # Complete heart block s/p PPM - EKG shows atrial-sensed ventricularly-paced sinus rhythm - HR stable in the 70s to 80s - Continue Eliquis    # HLD # CAD/MI # Carotic stenosis s/p  - Continue atorvastatin , aspirin  and Eliquis    # Seizure disorder - Continue Keppra  - Seizure precautions   # GERD - Continue Protonix     Body mass index is 29.53 kg/m.  Interventions:  Diet: Regular DVT Prophylaxis: Eliquis   Advance goals of care discussion: Full code  Family Communication: family was not present at bedside, at the time of interview.  The pt provided permission to discuss medical plan with the family. Opportunity was given to ask question and all questions were answered satisfactorily.   Disposition:  Pt is from home, admitted with syncope due to orthostatic hypotension, still has orthostatics positive, which precludes a safe discharge. Discharge to home, when stable, may need few days to improve.  Subjective: No significant events overnight, patient denies any symptoms right now, patient was having diarrhea for past 1 week yesterday, had 1 episode yesterday, no diarrhea today.  Patient had nausea, decreased p.o. intake, no vomiting. Currently patient is symptomatic due to orthostatic hypotension, no any other complaints  Physical Exam: General: NAD, lying comfortably Appear in no distress, affect appropriate Eyes: PERRLA ENT: Oral Mucosa Clear, moist  Neck: no JVD,  Cardiovascular: S1 and S2  Present, no Murmur,  Respiratory: good respiratory effort, Bilateral Air entry equal and Decreased, no Crackles, no wheezes Abdomen: Bowel Sound present, Soft and no tenderness,  Skin: no rashes Extremities:  no Pedal edema, no calf tenderness Neurologic: without any new focal findings Gait not checked due to patient safety concerns  Vitals:   08/29/23 0352 08/29/23 0746 08/29/23 0824 08/29/23 1523  BP: (!) 145/69 127/60  138/73  Pulse: 65 67  98  Resp: 18 17  20   Temp: 98.6 F (37 C) 98.6 F (37 C)  98 F (36.7 C)  TempSrc: Oral     SpO2: 92% 91% 95% 96%  Weight:      Height:        Intake/Output Summary (Last 24 hours) at 08/29/2023 1530 Last data filed at 08/29/2023 0400 Gross per 24 hour  Intake 626.36 ml  Output --  Net 626.36 ml   Filed Weights   08/28/23 1334  Weight: 90.7 kg    Data Reviewed: I have personally reviewed and interpreted daily labs, tele strips, imagings as discussed above. I reviewed all nursing notes, pharmacy notes, vitals, pertinent old records I have discussed plan of care as described above with RN and patient/family.  CBC: Recent Labs  Lab 08/26/23 1340 08/28/23 1352 08/29/23 0428  WBC 8.0 7.2 6.2  HGB 12.2 12.1 11.2*  HCT 37.3 36.8 34.6*  MCV 91.4 90.9 91.5  PLT 284 322 294   Basic Metabolic Panel: Recent Labs  Lab 08/26/23 1340 08/28/23 1544 08/29/23 0428  NA 136 138 138  K 3.7 4.0 2.9*  CL 99 97* 101  CO2 26 27 27   GLUCOSE 100* 116* 104*  BUN 24* 23 23  CREATININE 1.22* 1.02* 0.77  CALCIUM  9.0 9.2 8.7*  MG  --   --  1.8  PHOS  --   --  3.3    Studies: CT Angio Chest PE W and/or Wo Contrast Result Date: 08/28/2023 CLINICAL DATA:  Hypoxia.  History of lung cancer. EXAM: CT ANGIOGRAPHY CHEST WITH CONTRAST TECHNIQUE: Multidetector CT imaging of the chest was performed using the standard protocol during bolus administration of intravenous contrast. Multiplanar CT image reconstructions and MIPs were obtained to evaluate the vascular anatomy. RADIATION DOSE REDUCTION: This exam was performed according to the departmental dose-optimization program which includes automated exposure control, adjustment of the mA and/or kV according to  patient size and/or use of iterative reconstruction technique. CONTRAST:  75mL OMNIPAQUE  IOHEXOL  350 MG/ML SOLN COMPARISON:  06/24/2023 FINDINGS: Cardiovascular: Left pacer in place with leads in the right atrium and right ventricle. Heart is normal size. Aorta is normal caliber. Coronary artery and aortic atherosclerosis. No filling defects in the pulmonary arteries to suggest pulmonary emboli. Mediastinum/Nodes: No mediastinal, hilar, or axillary adenopathy. Trachea and esophagus are unremarkable. Thyroid  unremarkable. Lungs/Pleura: Moderate centrilobular emphysema. Post radiation changes/scarring in the right mid lung, stable. Left lower lobe pulmonary nodule measures up to 10 mm, stable since prior study. Lingular nodule measures 3 mm on image 92, stable. 2 mm right lower lobe pulmonary nodule on image 94 is stable. 5 mm medial left upper lobe nodule on image 48. In retrospect, this was present on prior study and is stable. 7 mm right upper lobe pulmonary nodule on image 56 is stable since prior study. No new or enlarging pulmonary nodules. Dependent linear densities in the lower lobes compatible with dependent atelectasis. No effusions. Upper Abdomen: No acute findings Musculoskeletal: Chest wall soft tissues are unremarkable. No acute bony abnormality. Review  of the MIP images confirms the above findings. IMPRESSION: No evidence of pulmonary embolus. Post treatment changes in the right mid lung. Scattered bilateral pulmonary nodules are stable since prior study. Coronary artery disease. Aortic Atherosclerosis (ICD10-I70.0) and Emphysema (ICD10-J43.9). Electronically Signed   By: Franky Crease M.D.   On: 08/28/2023 18:52   DG Chest Portable 1 View Result Date: 08/28/2023 CLINICAL DATA:  Hypoxia, syncopal episode. EXAM: PORTABLE CHEST 1 VIEW COMPARISON:  08/26/2023, 07/12/2023. FINDINGS: The heart size and mediastinal contours are stable. There is atherosclerotic calcification of the aorta. Mild atelectasis is  noted at the left lung base. There is stable linear scarring or atelectasis in the mid right lung. No effusion or pneumothorax is seen. Scattered nodular densities are noted in the lungs measuring up to 9 mm. A dual lead pacemaker device is present over the left chest. No acute osseous abnormality. IMPRESSION: 1. No active disease. 2. Mild atelectasis or scarring at the left lung base and mid right lung. 3. Scattered pulmonary nodules, recently evaluated with PET-CT. Electronically Signed   By: Leita Birmingham M.D.   On: 08/28/2023 16:46    Scheduled Meds:  apixaban   5 mg Oral BID   arformoterol   15 mcg Nebulization BID   And   umeclidinium bromide   1 puff Inhalation Daily   aspirin  EC  81 mg Oral Daily   atorvastatin   40 mg Oral Daily   levETIRAcetam   250 mg Oral BID   pantoprazole   40 mg Oral Daily   Continuous Infusions:  sodium chloride  Stopped (08/28/23 2144)   Followed by   sodium chloride  100 mL/hr at 08/28/23 2144   PRN Meds: acetaminophen  **OR** acetaminophen , ipratropium-albuterol , ondansetron  **OR** ondansetron  (ZOFRAN ) IV, mouth rinse, senna-docusate  Time spent: 55 minutes  Author: ELVAN SOR. MD [facility] 08/29/2023 3:30 PM  To reach On-call, see care teams to locate the attending and reach out to them via [email or web address]. If 7PM-7AM, please contact night-coverage If you still have difficulty reaching the attending provider, please page the [facility] (Director on Call) for [facility] on amion for assistance.

## 2023-08-29 NOTE — Evaluation (Signed)
 Physical Therapy Evaluation Patient Details Name: Diana Cook MRN: [id number] DOB: October 31, 1950 Today's Date: 08/29/2023  History of Present Illness  73 y.o. 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  Clinical Impression  Patient received in bed, she is pleasant and agrees to PT/OT assessment. Patient just had lunch. She reports no dizziness while in bed. She performed supine to sit with mod I. Sit to stand with min A +2. She was able to take a few side steps at edge of bed with RW, however additional ambulation limited by + orthostatics/dizziness. Patient will continue to benefit from skilled PT to improve endurance and strength.          If plan is discharge home, recommend the following: A little help with walking and/or transfers;A little help with bathing/dressing/bathroom;Assist for transportation;Help with stairs or ramp for entrance   Can travel by private vehicle    yes    Equipment Recommendations None recommended by PT  Recommendations for Other Services       Functional Status Assessment Patient has had a recent decline in their functional status and demonstrates the ability to make significant improvements in function in a reasonable and predictable amount of time.     Precautions / Restrictions Precautions Precautions: Fall Recall of Precautions/Restrictions: Intact Precaution/Restrictions Comments: MONITOR ORTHOSTATICS Restrictions Weight Bearing Restrictions Per Provider Order: No      Mobility  Bed Mobility Overal bed mobility: Modified Independent                  Transfers Overall transfer level: Needs assistance Equipment used:  Rolling walker (2 wheels) Transfers: Sit to/from Stand Sit to Stand: Min assist           General transfer comment: able to take a few lateral steps with min A and RW    Ambulation/Gait               General Gait Details: unable due to + orthostatics in standing  Stairs            Wheelchair Mobility     Tilt Bed    Modified Rankin (Stroke Patients Only)       Balance Overall balance assessment: Needs assistance Sitting-balance support: Feet supported Sitting balance-Leahy Scale: Good     Standing balance support: Bilateral upper extremity supported, During functional activity Standing balance-Leahy Scale: Good Standing balance comment: able to stand for ~4 minutes holding RW and supervision                             Pertinent Vitals/Pain Pain Assessment Pain Location: B shoulders and neck, earache both sides Pain Descriptors / Indicators: Aching Pain Intervention(s): Monitored during session    Home Living Family/patient expects to be discharged to:: Private residence Living Arrangements: Spouse/significant other Available Help at Discharge: Family;Available PRN/intermittently Type of Home: House Home Access: Level entry       Home Layout: One level Home Equipment: Rollator (4 wheels);Grab bars - tub/shower;Shower Counsellor (2 wheels) Additional Comments: 4L O2 at home    Prior Function Prior Level of Function : Driving;History of Falls (last six months);Independent/Modified Independent  Mobility Comments: PRN rollator when she has dizzy spells, rolls O2 with her and uses rollator for O2 when out; 3 falls blacked out ADLs Comments: indep with basic ADL, indep with IADL (limited), short distance familiar driving, 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, indep with meds     Extremity/Trunk Assessment   Upper Extremity Assessment Upper Extremity Assessment: Defer to OT evaluation    Lower  Extremity Assessment Lower Extremity Assessment: Generalized weakness    Cervical / Trunk Assessment Cervical / Trunk Assessment: Other exceptions Cervical / Trunk Exceptions: flexed cervical posture due to dizziness when looking forward  Communication   Communication Communication: No apparent difficulties    Cognition Arousal: Alert Behavior During Therapy: WFL for tasks assessed/performed   PT - Cognitive impairments: No apparent impairments                         Following commands: Intact       Cueing Cueing Techniques: Verbal cues     General Comments General comments (skin integrity, edema, etc.): Orthostatics: supine 131/75, HR 88; sitting 93/52, HR 88; standing 69/44, HR 96, standing 60/34, HR 99; return to supine 120/73, HR 101. She was very mildly symptomatic in sitting that worsened with time in standing. Pt also endorsing bilateral shoulder/neck discomfort, earache on B sides, and more symptomatic with neutral cervical positioning and cervical extension. She notes PRN numbness/tingling in her hands at times. RN, MD notified.    Exercises     Assessment/Plan    PT Assessment Patient needs continued PT services  PT Problem List Decreased strength;Decreased activity tolerance;Decreased balance;Decreased mobility;Cardiopulmonary status limiting activity       PT Treatment Interventions DME instruction;Gait training;Stair training;Functional mobility training;Therapeutic activities;Therapeutic exercise;Patient/family education;Canalith reposition    PT Goals (Current goals can be found in the Care Plan section)  Acute Rehab PT Goals Patient Stated Goal: to improve PT Goal Formulation: With patient Time For Goal Achievement: 09/12/23 Potential to Achieve Goals: Fair    Frequency Min 3X/week     Co-evaluation PT/OT/SLP Co-Evaluation/Treatment: Yes Reason for Co-Treatment: To address functional/ADL transfers;For patient/therapist  safety PT goals addressed during session: Mobility/safety with mobility;Balance OT goals addressed during session: ADL's and self-care       AM-PAC PT 6 Clicks Mobility  Outcome Measure Help needed turning from your back to your side while in a flat bed without using bedrails?: None Help needed moving from lying on your back to sitting on the side of a flat bed without using bedrails?: None Help needed moving to and from a bed to a chair (including a wheelchair)?: A Little Help needed standing up from a chair using your arms (e.g., wheelchair or bedside chair)?: A Little Help needed to walk in hospital room?: A Lot Help needed climbing 3-5 steps with a railing? : Total 6 Click Score: 17    End of Session Equipment Utilized During Treatment: Oxygen  Activity Tolerance: Patient limited by fatigue;Other (comment) (BP) Patient left: in bed;with call bell/phone within reach;with bed alarm set Nurse Communication: Mobility status PT Visit Diagnosis: Other abnormalities of gait and mobility (R26.89);Repeated falls (R29.6);Muscle weakness (generalized) (M62.81);Difficulty in walking, not elsewhere classified (R26.2);Dizziness and giddiness (R42)    Time: 8668-8645 PT Time Calculation (min) (ACUTE ONLY): 23 min   Charges:   PT Evaluation $PT Eval Moderate Complexity: 1 Mod   PT General Charges $$ ACUTE PT VISIT: 1 Visit         Adib Wahba,  PT, [facility] 08/29/23,2:24 PM

## 2023-08-29 NOTE — Plan of Care (Signed)

## 2023-08-29 NOTE — Evaluation (Signed)
 Occupational Therapy Evaluation Patient Details Name: Diana Cook MRN: [id number] DOB: 08-12-1950 Today's Date: 08/29/2023   History of Present Illness   73 y.o. 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     Clinical Impressions Pt was seen for OT evaluation this date. Prior to hospital admission, pt was grossly indep with ADL and IADL, using rollator for O2 tank mgt for community mobility and intermittently when dizzy in the home. She endorses short distance familiar driving, and at least 3 falls where she blacked out. Pt notes that she has to keep her head in a forward flexed position with her eyes looking down, otherwise she gets more dizzy. Pt lives with her spouse who works part time. Pt pleasant and agreeable to session. Pt presents to acute OT demonstrating impaired ADL performance and functional mobility 2/2 significant changes in BP with positional changes, not improved over time, bilat shoulder, neck, and bilat ear ache pain, and mild balance deficits caused by the dizziness requiring a RW for stability (See OT problem list for additional functional deficits). Pt currently requires CGA-PRN MIN A for LB ADL tasks in standing 2/2 impairments. Orthostatic vitals taken, + and symptomatic. MD, RN, TOC notified. Pt would benefit from skilled OT services to address noted impairments and functional limitations (see below for any additional details) in order to maximize safety and independence while minimizing falls risk and caregiver burden. Anticipate pt would benefit from follow up OT services upon acute hospital DC to address falls prevention and home/routines modifications for ADL and IADL.    If  plan is discharge home, recommend the following:   A little help with walking and/or transfers;A little help with bathing/dressing/bathroom;Assistance with cooking/housework;Assist for transportation;Help with stairs or ramp for entrance     Functional Status Assessment   Patient has had a recent decline in their functional status and demonstrates the ability to make significant improvements in function in a reasonable and predictable amount of time.     Equipment Recommendations   Other (comment) (reacher, LH sponge)     Recommendations for Other Services         Precautions/Restrictions   Precautions Precautions: Fall Recall of Precautions/Restrictions: Intact Precaution/Restrictions Comments: MONITOR ORTHOSTATICS Restrictions Weight Bearing Restrictions Per Provider Order: No     Mobility Bed Mobility Overal bed mobility: Modified Independent                  Transfers Overall transfer level: Needs assistance Equipment used: Rolling walker (2 wheels) Transfers: Sit to/from Stand Sit to Stand: Contact guard assist, Min assist, +2 physical assistance                  Balance Overall balance assessment: Needs assistance Sitting-balance support: No upper extremity supported, Feet supported, Single extremity supported Sitting balance-Leahy Scale: Fair     Standing balance support: Bilateral upper extremity supported Standing balance-Leahy Scale: Fair                             ADL either performed or assessed with clinical judgement   ADL Overall ADL's : Needs assistance/impaired  General ADL Comments: Pt curently requires CGA-MIN A for LB ADL in standing 2/2 dizziness, supv for seated ADL 2/2 BP issues.     Vision         Perception         Praxis         Pertinent Vitals/Pain Pain Assessment Pain Assessment: 0-10 Pain Score: 4  Pain Location: B shoulders and  neck, earache both sides Pain Descriptors / Indicators: Aching Pain Intervention(s): Limited activity within patient's tolerance, Monitored during session, Repositioned, Patient requesting pain meds-RN notified     Extremity/Trunk Assessment Upper Extremity Assessment Upper Extremity Assessment:  (shoulder assessment limited 2/2 bilat shoulder pain. Endorses intermittent numbness/tingling in hands (not during assessment))   Lower Extremity Assessment Lower Extremity Assessment: Defer to PT evaluation       Communication Communication Communication: No apparent difficulties   Cognition Arousal: Alert Behavior During Therapy: WFL for tasks assessed/performed Cognition: No apparent impairments                               Following commands: Intact       Cueing  General Comments      Orthostatics: supine 131/75, HR 88; sitting 93/52, HR 88; standing 69/44, HR 96, standing 60/34, HR 99; return to supine 120/73, HR 101. She was very mildly symptomatic in sitting that worsened with time in standing. Pt also endorsing bilateral shoulder/neck discomfort, earache on B sides, and more symptomatic with neutral cervical positioning and cervical extension. She notes PRN numbness/tingling in her hands at times. RN, MD notified.   Exercises Other Exercises Other Exercises: Pt educated in falls prevention strategies and positioning to minimize impact of dizziness/BP   Shoulder Instructions      Home Living Family/patient expects to be discharged to:: Private residence Living Arrangements: Spouse/significant other Available Help at Discharge: Family;Available PRN/intermittently Type of Home: House (condo) Home Access: Level entry     Home Layout: One level     Bathroom Shower/Tub: Producer, television/film/video: Handicapped height     Home Equipment: Rollator (4 wheels);Grab bars - tub/shower;Shower Counsellor (2 wheels)   Additional Comments:  4L O2 at home      Prior Functioning/Environment Prior Level of Function : Driving;History of Falls (last six months);Independent/Modified Independent             Mobility Comments: PRN rollator when she has dizzy spells, rolls O2 with her and uses rollator for O2 when out; 3 falls blacked out ADLs Comments: indep with basic ADL, indep with IADL (limited), short distance familiar driving, 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, indep with meds    OT Problem List: Decreased strength;Pain;Cardiopulmonary status limiting activity;Impaired balance (sitting and/or standing);Decreased knowledge of use of DME or AE;Impaired UE functional use   OT Treatment/Interventions: Self-care/ADL training;Therapeutic exercise;Therapeutic activities;DME and/or AE instruction;Energy conservation;Patient/family education;Balance training      OT Goals(Current goals can be found in the care plan section)   Acute Rehab OT Goals Patient Stated Goal: have less dizziness OT Goal Formulation: With patient Time For Goal Achievement: 09/12/23 Potential to Achieve Goals: Fair ADL Goals Additional ADL Goal #1: Pt will complete all aspects of showering with mod indep, 1/1 opportunity with no noted dizziness/symptoms. Additional ADL Goal #2: Pt will verbalize plan to implement at least 2 learned falls prevention strategies to maximize safety during ADL/mobiltiy. Additional ADL Goal #3: Pt will complete all aspects  of dressing in combination of sitting/standing with mod indep, 2/2 opportunities.   OT Frequency:  Min 2X/week    Co-evaluation PT/OT/SLP Co-Evaluation/Treatment: Yes Reason for Co-Treatment: For patient/therapist safety;To address functional/ADL transfers PT goals addressed during session: Mobility/safety with mobility OT goals addressed during session: ADL's and self-care      AM-PAC OT 6 Clicks Daily Activity     Outcome Measure Help from another person eating meals?: None Help from  another person taking care of personal grooming?: None Help from another person toileting, which includes using toliet, bedpan, or urinal?: A Little Help from another person bathing (including washing, rinsing, drying)?: A Little Help from another person to put on and taking off regular upper body clothing?: None Help from another person to put on and taking off regular lower body clothing?: A Little 6 Click Score: 21   End of Session Equipment Utilized During Treatment: Rolling walker (2 wheels);Oxygen  Nurse Communication: Mobility status;Patient requests pain meds;Other (comment) (BP)  Activity Tolerance: Treatment limited secondary to medical complications (Comment) (dizziness) Patient left: in bed;with call bell/phone within reach;with bed alarm set  OT Visit Diagnosis: Other abnormalities of gait and mobility (R26.89);Repeated falls (R29.6)                Time: 8667-8645 OT Time Calculation (min): 22 min Charges:  OT General Charges $OT Visit: 1 Visit OT Evaluation $OT Eval Low Complexity: 1 Low  Warren SAUNDERS., MPH, MS, OTR/L ascom [phone] 08/29/23, 2:17 PM

## 2023-08-30 DIAGNOSIS — I951 Orthostatic hypotension: Secondary | ICD-10-CM | POA: Diagnosis not present

## 2023-08-30 LAB — BASIC METABOLIC PANEL WITH GFR
Anion gap: 7 (ref 5–15)
BUN: 17 mg/dL (ref 8–23)
CO2: 21 mmol/L — ABNORMAL LOW (ref 22–32)
Calcium: 8.5 mg/dL — ABNORMAL LOW (ref 8.9–10.3)
Chloride: 110 mmol/L (ref 98–111)
Creatinine, Ser: 0.77 mg/dL (ref 0.44–1.00)
GFR, Estimated: >60 mL/min (ref >60)
Glucose, Bld: 109 mg/dL — ABNORMAL HIGH (ref 70–99)
Potassium: 3.3 mmol/L — ABNORMAL LOW (ref 3.5–5.1)
Sodium: 138 mmol/L (ref 135–145)

## 2023-08-30 LAB — CBC
HCT: 30.7 % — ABNORMAL LOW (ref 36.0–46.0)
Hemoglobin: 10.2 g/dL — ABNORMAL LOW (ref 12.0–15.0)
MCH: 30.2 pg (ref 26.0–34.0)
MCHC: 33.2 g/dL (ref 30.0–36.0)
MCV: 90.8 fL (ref 80.0–100.0)
Platelets: 288 K/uL (ref 150–400)
RBC: 3.38 MIL/uL — ABNORMAL LOW (ref 3.87–5.11)
RDW: 12.6 % (ref 11.5–15.5)
WBC: 6.1 K/uL (ref 4.0–10.5)
nRBC: 0 % (ref 0.0–0.2)

## 2023-08-30 LAB — PHOSPHORUS: Phosphorus: 2.7 mg/dL (ref 2.5–4.6)

## 2023-08-30 LAB — MAGNESIUM: Magnesium: 1.5 mg/dL — ABNORMAL LOW (ref 1.7–2.4)

## 2023-08-30 MED ORDER — SODIUM CHLORIDE 0.9 % IV SOLN
1.0000 g | INTRAVENOUS | Status: DC
  Administered 2023-08-30 – 2023-09-02 (×4): 1 g via INTRAVENOUS
  Filled 2023-08-30 (×4): qty 10

## 2023-08-30 MED ORDER — ASPIRIN 81 MG PO TBEC
81.0000 mg | DELAYED_RELEASE_TABLET | Freq: Every day | ORAL | Status: DC
  Administered 2023-08-30 – 2023-09-10 (×12): 81 mg via ORAL
  Filled 2023-08-30 (×12): qty 1

## 2023-08-30 MED ORDER — MAGNESIUM SULFATE 2 GM/50ML IV SOLN
2.0000 g | Freq: Once | INTRAVENOUS | Status: AC
  Administered 2023-08-30: 2 g via INTRAVENOUS
  Filled 2023-08-30: qty 50

## 2023-08-30 MED ORDER — POTASSIUM CHLORIDE CRYS ER 20 MEQ PO TBCR
40.0000 meq | EXTENDED_RELEASE_TABLET | Freq: Once | ORAL | Status: AC
  Administered 2023-08-30: 40 meq via ORAL
  Filled 2023-08-30: qty 2

## 2023-08-30 MED ORDER — ZOLPIDEM TARTRATE 5 MG PO TABS
5.0000 mg | ORAL_TABLET | Freq: Every evening | ORAL | Status: DC | PRN
  Administered 2023-08-30 – 2023-09-10 (×12): 5 mg via ORAL
  Filled 2023-08-30 (×13): qty 1

## 2023-08-30 NOTE — Progress Notes (Signed)
 This Clinical research associate obtained a urine specimen via clean catch for order for Urinalysis at 2025pm. The specimen was labeled and sent to the lab.

## 2023-08-30 NOTE — Progress Notes (Signed)
 [facility] Progress Note  Patient: Diana Cook    FMW:[id number]  DOA: 08/28/2023     Date of Service: the patient was seen and examined on 08/30/2023  Chief Complaint  Patient presents with   Loss of Consciousness   Hypotension   Brief hospital course: Diana Cook is a 73 y.o. 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  Patient is having diarrhea for past 1 week, decreased p.o. intake that might be contributing to dehydration and orthostatic hypotension.  ED course: VS Positive orthostatics BMP chloride 97, glucose 116, creatinine 1.02 rest within normal range CBC within normal range  CT head:  small left frontal scalp hematoma but no intracranial abnormalities. CT C-spine with no acute fracture or dislocation.  CTA chest PE study negative for PE.    Pt received IV NS 1 L bolus. TRH was consulted for admission.   Assessment and Plan:  # Orthostatic hypotension most likely due to dehydration secondary to diarrhea and decreased p.o. intake Patient presented with syncope and lightheadedness. Cortisol level within normal range, TSH 0.78 at lower end Continue fall precaution Hold Lasix  for now Held Prozac  as benazepril -TTE earlier this month shows normal EF with G1DD but no valvular abnormalities  -Carotid re-stenosis considered however recent U/S of her carotid shows normal flow hemodynamics  Continue to monitor on telemetry Check orthostatics every shift Continue fall precaution Follow PT/OT eval   # UTI, UA positive, started ceftriaxone  Follow urine culture  # Hypokalemia, potassium repleted # Hypomagnesemia, mag repleted. Monitor  electrolytes and replete as needed.  # COPD # Chronic hypoxic respiratory failure - No respiratory distress, remains on baseline 4 L [place] - Continue home bronchodilator - As needed DuoNebs - Continue supplemental O2   # HFpEF - TTE earlier this month shows normal EF with G1DD but no valvular abnormalities - Patient dry on exam - Hold as needed Lasix    # Paroxysmal A-fib # Complete heart block s/p PPM - EKG shows atrial-sensed ventricularly-paced sinus rhythm - HR stable in the 70s to 80s - Continue Eliquis    # HLD # CAD/MI # Carotic stenosis s/p  - Continue atorvastatin , aspirin  and Eliquis    # Seizure disorder - Continue Keppra  - Seizure precautions   # GERD - Continue Protonix     Body mass index is 29.53 kg/m.  Interventions:  Diet: Regular DVT Prophylaxis: Eliquis   Advance goals of care discussion: Full code  Family Communication: family was not present at bedside, at the time of interview.  The pt provided permission to discuss medical plan with the family. Opportunity was given to ask question and all questions were answered satisfactorily.   Disposition:  Pt is from home, admitted with syncope due to orthostatic hypotension, still has orthostatics positive, which precludes a safe discharge. Discharge to home, when stable, may need few days to improve.  Subjective: No significant events overnight, but patient is complaining of some dysuria since yesterday, she has history of recurrent UTIs.  Patient had 2-3 episodes of loose stools yesterday but it is not as watery as it used to be a week ago.  No bowel movements today. Patient has not got out of the bed and no orthostatics done today so she  is not sure about her symptoms.  We will check orthostatics to see if she is dizzy or not.   Physical Exam: General: NAD, lying comfortably Appear in no distress, affect appropriate Eyes: PERRLA ENT: Oral Mucosa Clear, moist  Neck: no JVD,  Cardiovascular: S1 and S2  Present, no Murmur,  Respiratory: good respiratory effort, Bilateral Air entry equal and Decreased, no Crackles, no wheezes Abdomen: Bowel Sound present, Soft and no tenderness,  Skin: no rashes Extremities: no Pedal edema, no calf tenderness Neurologic: without any new focal findings Gait not checked due to patient safety concerns  Vitals:   08/30/23 0445 08/30/23 0739 08/30/23 0810 08/30/23 1118  BP: 139/61 127/67  138/70  Pulse: 73 71  78  Resp: 18 18  18   Temp: 98.1 F (36.7 C) 97.9 F (36.6 C)  97.8 F (36.6 C)  TempSrc:      SpO2: 92% 90% 94% 90%  Weight:      Height:       No intake or output data in the 24 hours ending 08/30/23 1519  Filed Weights   08/28/23 1334  Weight: 90.7 kg    Data Reviewed: I have personally reviewed and interpreted daily labs, tele strips, imagings as discussed above. I reviewed all nursing notes, pharmacy notes, vitals, pertinent old records I have discussed plan of care as described above with RN and patient/family.  CBC: Recent Labs  Lab 08/26/23 1340 08/28/23 1352 08/29/23 0428 08/30/23 0449  WBC 8.0 7.2 6.2 6.1  HGB 12.2 12.1 11.2* 10.2*  HCT 37.3 36.8 34.6* 30.7*  MCV 91.4 90.9 91.5 90.8  PLT 284 322 294 288   Basic Metabolic Panel: Recent Labs  Lab 08/26/23 1340 08/28/23 1544 08/29/23 0428 08/29/23 1808 08/30/23 0449  NA 136 138 138 140 138  K 3.7 4.0 2.9* 4.3 3.3*  CL 99 97* 101 107 110  CO2 26 27 27 24  21*  GLUCOSE 100* 116* 104* 111* 109*  BUN 24* 23 23 24* 17  CREATININE 1.22* 1.02* 0.77 0.98 0.77  CALCIUM  9.0 9.2 8.7* 9.2 8.5*  MG  --   --  1.8  --  1.5*  PHOS  --   --  3.3  --  2.7    Studies: No results found.   Scheduled Meds:  apixaban   5 mg Oral BID   arformoterol   15 mcg Nebulization BID   And   umeclidinium bromide   1 puff Inhalation Daily   aspirin  EC  81 mg Oral Daily   atorvastatin   40 mg Oral Daily   levETIRAcetam   250 mg Oral BID   pantoprazole   40 mg Oral Daily   Continuous  Infusions:  cefTRIAXone  (ROCEPHIN )  IV 1 g (08/30/23 1131)   sodium chloride  Stopped (08/28/23 2144)   PRN Meds: acetaminophen  **OR** acetaminophen , ipratropium-albuterol , ondansetron  **OR** ondansetron  (ZOFRAN ) IV, mouth rinse, senna-docusate  Time spent: 42 minutes  Author: ELVAN SOR. MD [facility] 08/30/2023 3:19 PM  To reach On-call, see care teams to locate the attending and reach out to them via [email or web address]. If 7PM-7AM, please contact night-coverage If you still have difficulty reaching the attending provider, please page the [facility] (Director on Call) for [facility] on amion for assistance.

## 2023-08-30 NOTE — Progress Notes (Signed)
 The patient completed IV Potassium 10 MEQ and IV 0.9% sodium chloride .

## 2023-08-30 NOTE — Plan of Care (Signed)
 The patient c/o headache 8/10. PRN Tylenol  given for pain.

## 2023-08-30 NOTE — Plan of Care (Signed)
   Problem: Education: Goal: Knowledge of General Education information will improve Description Including pain rating scale, medication(s)/side effects and non-pharmacologic comfort measures Outcome: Progressing   Problem: Health Behavior/Discharge Planning: Goal: Ability to manage health-related needs will improve Outcome: Progressing

## 2023-08-31 ENCOUNTER — Telehealth: Payer: Self-pay

## 2023-08-31 DIAGNOSIS — I951 Orthostatic hypotension: Secondary | ICD-10-CM | POA: Diagnosis not present

## 2023-08-31 LAB — BASIC METABOLIC PANEL WITH GFR
Anion gap: 9 (ref 5–15)
BUN: 11 mg/dL (ref 8–23)
CO2: 20 mmol/L — ABNORMAL LOW (ref 22–32)
Calcium: 8.8 mg/dL — ABNORMAL LOW (ref 8.9–10.3)
Chloride: 106 mmol/L (ref 98–111)
Creatinine, Ser: 0.74 mg/dL (ref 0.44–1.00)
GFR, Estimated: >60 mL/min (ref >60)
Glucose, Bld: 100 mg/dL — ABNORMAL HIGH (ref 70–99)
Potassium: 3.8 mmol/L (ref 3.5–5.1)
Sodium: 135 mmol/L (ref 135–145)

## 2023-08-31 LAB — FOLATE: Folate: 13.6 ng/mL (ref >5.9)

## 2023-08-31 LAB — MAGNESIUM: Magnesium: 1.8 mg/dL (ref 1.7–2.4)

## 2023-08-31 LAB — CBC
HCT: 30.7 % — ABNORMAL LOW (ref 36.0–46.0)
Hemoglobin: 10.2 g/dL — ABNORMAL LOW (ref 12.0–15.0)
MCH: 29.9 pg (ref 26.0–34.0)
MCHC: 33.2 g/dL (ref 30.0–36.0)
MCV: 90 fL (ref 80.0–100.0)
Platelets: 306 K/uL (ref 150–400)
RBC: 3.41 MIL/uL — ABNORMAL LOW (ref 3.87–5.11)
RDW: 12.5 % (ref 11.5–15.5)
WBC: 6.8 K/uL (ref 4.0–10.5)
nRBC: 0 % (ref 0.0–0.2)

## 2023-08-31 LAB — PHOSPHORUS: Phosphorus: 2.7 mg/dL (ref 2.5–4.6)

## 2023-08-31 MED ORDER — MIDODRINE HCL 5 MG PO TABS
5.0000 mg | ORAL_TABLET | Freq: Three times a day (TID) | ORAL | Status: DC
  Administered 2023-08-31 – 2023-09-01 (×2): 5 mg via ORAL
  Filled 2023-08-31 (×2): qty 1

## 2023-08-31 NOTE — Plan of Care (Signed)

## 2023-08-31 NOTE — Telephone Encounter (Signed)
 Per chart review tab pt was admitted to [facility] on 08/28/23. Sending note to Dr Watt since Dr Randeen is out of office.

## 2023-08-31 NOTE — Progress Notes (Signed)
 [facility] Progress Note  Patient: Diana Cook    FMW:[id number]  DOA: 08/28/2023     Date of Service: the patient was seen and examined on 08/31/2023  Chief Complaint  Patient presents with   Loss of Consciousness   Hypotension   Brief hospital course: Diana Cook is a 73 y.o. 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  Patient is having diarrhea for past 1 week, decreased p.o. intake that might be contributing to dehydration and orthostatic hypotension.  ED course: VS Positive orthostatics BMP chloride 97, glucose 116, creatinine 1.02 rest within normal range CBC within normal range  CT head:  small left frontal scalp hematoma but no intracranial abnormalities. CT C-spine with no acute fracture or dislocation.  CTA chest PE study negative for PE.    Pt received IV NS 1 L bolus. TRH was consulted for admission.   Assessment and Plan:  # Orthostatic hypotension most likely due to dehydration secondary to diarrhea and decreased p.o. intake Patient presented with syncope and lightheadedness. Cortisol level within normal range, TSH 0.78 at lower end Continue fall precaution Hold Lasix  for now Held Prozac  as benazepril -TTE earlier this month shows normal EF with G1DD but no valvular abnormalities  -Carotid re-stenosis considered however recent U/S of her carotid shows normal flow hemodynamics  Continue to monitor on telemetry 7/7 started midodrine  5 mg p.o. 3 times daily with holding parameters Continue abdominal binder and TED hose while ambulation Check orthostatics every shift Continue fall precaution Follow PT/OT eval   # UTI, UA positive,  started ceftriaxone  Follow urine culture 10K E. coli, sensitivity pending  # Hypokalemia, potassium repleted # Hypomagnesemia, mag repleted. Monitor electrolytes and replete as needed.  # COPD # Chronic hypoxic respiratory failure - No respiratory distress, remains on baseline 4 L  - Continue home bronchodilator - As needed DuoNebs - Continue supplemental O2   # HFpEF - TTE earlier this month shows normal EF with G1DD but no valvular abnormalities - Patient dry on exam - Hold as needed Lasix    # Paroxysmal A-fib # Complete heart block s/p PPM - EKG shows atrial-sensed ventricularly-paced sinus rhythm - HR stable in the 70s to 80s - Continue Eliquis    # HLD # CAD/MI # Carotic stenosis s/p  - Continue atorvastatin , aspirin  and Eliquis    # Seizure disorder - Continue Keppra  - Seizure precautions   # GERD - Continue Protonix     Body mass index is 29.53 kg/m.  Interventions:  Diet: Regular DVT Prophylaxis: Eliquis   Advance goals of care discussion: Full code  Family Communication: family was not present at bedside, at the time of interview.  The pt provided permission to discuss medical plan with the family. Opportunity was given to ask question and all questions were answered satisfactorily.   Disposition:  Pt is from home, admitted with syncope due to orthostatic hypotension, still has orthostatics positive, which precludes a safe discharge. Discharge to home, when stable, may need few days to improve.  Subjective: No significant events overnight, patient still has orthostatic hypotension, she was feeling dizzy while standing up with physical therapy.  Abdominal binder and TED hose were used but did not help much. We will continue to monitor,  patient was lying comfortably, denies any specific complaints while lying in the bed.    Physical Exam: General: NAD, lying comfortably Appear in no distress, affect appropriate Eyes: PERRLA ENT: Oral Mucosa Clear,  moist  Neck: no JVD,  Cardiovascular: S1 and S2 Present, no Murmur,  Respiratory: good respiratory effort, Bilateral Air entry equal and Decreased, no Crackles, no wheezes Abdomen: Bowel Sound present, Soft and no tenderness,  Skin: no rashes Extremities: no Pedal edema, no calf tenderness Neurologic: without any new focal findings Gait not checked due to patient safety concerns  Vitals:   08/30/23 2018 08/31/23 0029 08/31/23 0407 08/31/23 0751  BP: (!) 158/105 (!) 119/56 136/67 (!) 111/53  Pulse: 85 79 86 82  Resp:   20 16  Temp:  98.3 F (36.8 C) 98.7 F (37.1 C) 98 F (36.7 C)  TempSrc:      SpO2: 90% 91% (!) 87% 92%  Weight:      Height:        Intake/Output Summary (Last 24 hours) at 08/31/2023 1548 Last data filed at 08/31/2023 1432 Gross per 24 hour  Intake 840 ml  Output --  Net 840 ml    Filed Weights   08/28/23 1334  Weight: 90.7 kg    Data Reviewed: I have personally reviewed and interpreted daily labs, tele strips, imagings as discussed above. I reviewed all nursing notes, pharmacy notes, vitals, pertinent old records I have discussed plan of care as described above with RN and patient/family.  CBC: Recent Labs  Lab 08/26/23 1340 08/28/23 1352 08/29/23 0428 08/30/23 0449 08/31/23 0501  WBC 8.0 7.2 6.2 6.1 6.8  HGB 12.2 12.1 11.2* 10.2* 10.2*  HCT 37.3 36.8 34.6* 30.7* 30.7*  MCV 91.4 90.9 91.5 90.8 90.0  PLT 284 322 294 288 306   Basic Metabolic Panel: Recent Labs  Lab 08/28/23 1544 08/29/23 0428 08/29/23 1808 08/30/23 0449 08/31/23 0501  NA 138 138 140 138 135  K 4.0 2.9* 4.3 3.3* 3.8  CL 97* 101 107 110 106  CO2 27 27 24  21* 20*  GLUCOSE 116* 104* 111* 109* 100*  BUN 23 23 24* 17 11  CREATININE 1.02* 0.77 0.98 0.77 0.74  CALCIUM  9.2 8.7* 9.2 8.5* 8.8*  MG  --  1.8  --  1.5* 1.8  PHOS  --  3.3  --  2.7 2.7    Studies: No results found.   Scheduled Meds:  apixaban   5 mg Oral BID   arformoterol   15 mcg Nebulization BID   And    umeclidinium bromide   1 puff Inhalation Daily   aspirin  EC  81 mg Oral QHS   atorvastatin   40 mg Oral Daily   levETIRAcetam   250 mg Oral BID   pantoprazole   40 mg Oral Daily   Continuous Infusions:  cefTRIAXone  (ROCEPHIN )  IV 1 g (08/31/23 1012)   sodium chloride  Stopped (08/28/23 2144)   PRN Meds: acetaminophen  **OR** acetaminophen , ipratropium-albuterol , ondansetron  **OR** ondansetron  (ZOFRAN ) IV, mouth rinse, senna-docusate, zolpidem   Time spent: 55 minutes  Author: ELVAN SOR. MD [facility] 08/31/2023 3:48 PM  To reach On-call, see care teams to locate the attending and reach out to them via [email or web address]. If 7PM-7AM, please contact night-coverage If you still have difficulty reaching the attending provider, please page the [facility] (Director on Call) for [facility] on amion for assistance.

## 2023-08-31 NOTE — Progress Notes (Signed)
 Physical Therapy Treatment Patient Details Name: Diana Cook MRN: [id number] DOB: Jul 25, 1950 Today's Date: 08/31/2023   History of Present Illness 73 y.o. 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    PT Comments  Pt was A and O x 4. Pleasant and cooperative with supportive family/friends at bedside. Pt remains extremely motivated to improve. On her 4L baseline O2 throughout session.  BP in supine:116/82 BP in sitting: 109/56 BP in standing:84/62(57) BP in standing for > 2 minutes unable to get accurate reading due to unsteadiness/hypotensive symptoms and requiring seated rest.  Pt continues to not required physical assistance to stand or perform desired task but is limited by symptoms of orthostatic hypotension. Pt endorses dealing with this more a long time however not as severe as it has been lately. Due to MAP dropping below 60, author elected not to attempt ambulation away from EOB. Dc recs remain appropriate for when pt is able to perform ADLs without severe drop in BP. Acute PT will continue to follow per current POC.    If plan is discharge home, recommend the following:116/82 in supine, sitting 109/56, standing EOB 84/62 (57)  A little help with walking and/or transfers;A little help with bathing/dressing/bathroom;Assist for transportation;Help with stairs or ramp for entrance     Equipment Recommendations  None recommended by PT       Precautions / Restrictions Precautions Precautions: Fall Recall of Precautions/Restrictions: Intact Precaution/Restrictions Comments: MONITOR ORTHOSTATICS Restrictions Weight Bearing Restrictions Per Provider Order: No      Mobility  Bed Mobility Overal bed mobility: Modified Independent   Transfers Overall transfer level: Needs assistance Equipment used: Rolling walker (2 wheels) Transfers: Sit to/from Stand Sit to Stand: Contact guard assist  General transfer comment: CGA for safety to stand EOB 2 x. Orthostatic hypotensive concerns limited session progression.    Ambulation/Gait  General Gait Details: unable due to low BP in standing     Balance Overall balance assessment: Needs assistance Sitting-balance support: Feet supported Sitting balance-Leahy Scale: Good     Standing balance support: Bilateral upper extremity supported, During functional activity, Reliant on assistive device for balance Standing balance-Leahy Scale: Good     Communication Communication Communication: No apparent difficulties  Cognition Arousal: Alert Behavior During Therapy: WFL for tasks assessed/performed   PT - Cognitive impairments: No apparent impairments        PT - Cognition Comments: Pt is A and O with supportive spouse/ family friends in room Following commands: Intact      Cueing Cueing Techniques: Verbal cues, Tactile cues     General Comments General comments (skin integrity, edema, etc.): Author encourged pt to perform as many ther ex in bed seated EOB to promote strengthneing due to limited abilities to safely progress away from EOB      Pertinent Vitals/Pain Pain Assessment Pain Assessment: No/denies pain Pain Score: 0-No pain     PT Goals (current goals can now be found in the care plan section) Acute Rehab PT Goals Patient Stated Goal: get better so I can go home/beach Progress towards PT goals: Not progressing toward goals - comment    Frequency    Min 3X/week           Co-evaluation  PT goals addressed during session: Mobility/safety with mobility;Balance;Proper use of DME;Strengthening/ROM        AM-PAC PT 6 Clicks Mobility   Outcome Measure  Help needed  turning from your back to your side while in a flat bed without using bedrails?: None Help needed moving from lying on your back to sitting on the side of a flat bed without using bedrails?: None Help needed moving to and from a bed to a chair (including a wheelchair)?: A Little Help needed standing up from a chair using your arms (e.g., wheelchair or bedside chair)?: A Little Help needed to walk in hospital room?: A Little Help needed climbing 3-5 steps with a railing? : A Little 6 Click Score: 20    End of Session Equipment Utilized During Treatment: Oxygen  (4 L o2 at baseline) Activity Tolerance: Patient tolerated treatment well;Patient limited by fatigue Patient left: in bed;with call bell/phone within reach;with bed alarm set Nurse Communication: Mobility status PT Visit Diagnosis: Other abnormalities of gait and mobility (R26.89);Repeated falls (R29.6);Muscle weakness (generalized) (M62.81);Difficulty in walking, not elsewhere classified (R26.2);Dizziness and giddiness (R42)     Time: 8550-8495 PT Time Calculation (min) (ACUTE ONLY): 15 min  Charges:    $Therapeutic Activity: 8-22 mins PT General Charges $$ ACUTE PT VISIT: 1 Visit                     Rankin Essex PTA 08/31/23, 5:02 PM

## 2023-08-31 NOTE — Telephone Encounter (Signed)
 [name]

## 2023-09-01 DIAGNOSIS — I951 Orthostatic hypotension: Secondary | ICD-10-CM | POA: Diagnosis not present

## 2023-09-01 LAB — BASIC METABOLIC PANEL WITH GFR
Anion gap: 8 (ref 5–15)
BUN: 13 mg/dL (ref 8–23)
CO2: 24 mmol/L (ref 22–32)
Calcium: 8.9 mg/dL (ref 8.9–10.3)
Chloride: 103 mmol/L (ref 98–111)
Creatinine, Ser: 0.84 mg/dL (ref 0.44–1.00)
GFR, Estimated: >60 mL/min (ref >60)
Glucose, Bld: 103 mg/dL — ABNORMAL HIGH (ref 70–99)
Potassium: 3.6 mmol/L (ref 3.5–5.1)
Sodium: 135 mmol/L (ref 135–145)

## 2023-09-01 LAB — CBC
HCT: 30.8 % — ABNORMAL LOW (ref 36.0–46.0)
Hemoglobin: 10.2 g/dL — ABNORMAL LOW (ref 12.0–15.0)
MCH: 29.6 pg (ref 26.0–34.0)
MCHC: 33.1 g/dL (ref 30.0–36.0)
MCV: 89.3 fL (ref 80.0–100.0)
Platelets: 334 K/uL (ref 150–400)
RBC: 3.45 MIL/uL — ABNORMAL LOW (ref 3.87–5.11)
RDW: 12.5 % (ref 11.5–15.5)
WBC: 7.2 K/uL (ref 4.0–10.5)
nRBC: 0 % (ref 0.0–0.2)

## 2023-09-01 LAB — URINE CULTURE: Culture: 10000 — AB

## 2023-09-01 LAB — MAGNESIUM: Magnesium: 1.8 mg/dL (ref 1.7–2.4)

## 2023-09-01 LAB — PHOSPHORUS: Phosphorus: 3.5 mg/dL (ref 2.5–4.6)

## 2023-09-01 MED ORDER — MAGNESIUM OXIDE -MG SUPPLEMENT 400 (240 MG) MG PO TABS
400.0000 mg | ORAL_TABLET | Freq: Two times a day (BID) | ORAL | Status: AC
  Administered 2023-09-01 – 2023-09-03 (×6): 400 mg via ORAL
  Filled 2023-09-01 (×6): qty 1

## 2023-09-01 MED ORDER — POTASSIUM CHLORIDE CRYS ER 20 MEQ PO TBCR
40.0000 meq | EXTENDED_RELEASE_TABLET | Freq: Once | ORAL | Status: AC
  Administered 2023-09-01: 40 meq via ORAL
  Filled 2023-09-01: qty 2

## 2023-09-01 MED ORDER — MIDODRINE HCL 5 MG PO TABS
10.0000 mg | ORAL_TABLET | Freq: Three times a day (TID) | ORAL | Status: DC
  Administered 2023-09-01 – 2023-09-11 (×28): 10 mg via ORAL
  Filled 2023-09-01 (×29): qty 2

## 2023-09-01 NOTE — Plan of Care (Signed)

## 2023-09-01 NOTE — Care Management Important Message (Signed)
 Important Message  Patient Details  Name: Diana Cook MRN: [id number] Date of Birth: 13-Apr-1950   Important Message Given:  Yes - Medicare IM     Ahliyah Nienow W, CMA 09/01/2023, 3:40 PM

## 2023-09-01 NOTE — Progress Notes (Signed)
 [facility] Progress Note  Patient: Diana Cook    FMW:[id number]  DOA: 08/28/2023     Date of Service: the patient was seen and examined on 09/01/2023  Chief Complaint  Patient presents with   Loss of Consciousness   Hypotension   Brief hospital course: Shayanne C Sackmann is a 73 y.o. 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  Patient is having diarrhea for past 1 week, decreased p.o. intake that might be contributing to dehydration and orthostatic hypotension.  ED course: VS Positive orthostatics BMP chloride 97, glucose 116, creatinine 1.02 rest within normal range CBC within normal range  CT head:  small left frontal scalp hematoma but no intracranial abnormalities. CT C-spine with no acute fracture or dislocation.  CTA chest PE study negative for PE.    Pt received IV NS 1 L bolus. TRH was consulted for admission.   Assessment and Plan:  # Orthostatic hypotension most likely due to dehydration secondary to diarrhea and decreased p.o. intake Patient presented with syncope and lightheadedness. Cortisol level within normal range, TSH 0.78 at lower end Continue fall precaution Hold Lasix  for now Held Prozac  as benazepril -TTE earlier this month shows normal EF with G1DD but no valvular abnormalities  -Carotid re-stenosis considered however recent U/S of her carotid shows normal flow hemodynamics  Continue to monitor on telemetry 7/7 started midodrine  5 mg p.o. 3 times daily with holding parameters Continue abdominal binder and TED hose while ambulation Check orthostatics every shift Continue fall precaution Follow PT/OT eval 7/8 increased midodrine   from 5 to 10 mg p.o. 3 times daily with holding parameters due to persistent orthostatic hypotension and feeling dizzy   # UTI, UA positive, started ceftriaxone  Follow urine culture 10K E. coli, sensitivity pending  # Hypokalemia, potassium repleted # Hypomagnesemia, mag repleted. Monitor electrolytes and replete as needed.  # COPD # Chronic hypoxic respiratory failure - No respiratory distress, remains on baseline 4 L [place] - Continue home bronchodilator - As needed DuoNebs - Continue supplemental O2   # Chronic diastolic CHF  - TTE LVEF 55 to 60%, G1DD but no valvular abnormalities - Patient dry on exam - Hold as needed Lasix    # Paroxysmal A-fib # Complete heart block s/p PPM - EKG shows atrial-sensed ventricularly-paced sinus rhythm - HR stable in the 70s to 80s - Continue Eliquis    # HLD # CAD/MI # Carotic stenosis s/p  - Continue atorvastatin , aspirin  and Eliquis    # Seizure disorder - Continue Keppra  - Seizure precautions   # GERD - Continue Protonix     Body mass index is 29.53 kg/m.  Interventions:  Diet: Regular DVT Prophylaxis: Eliquis   Advance goals of care discussion: Full code  Family Communication: family was not present at bedside, at the time of interview.  The pt provided permission to discuss medical plan with the family. Opportunity was given to ask question and all questions were answered satisfactorily.   Disposition:  Pt is from home, admitted with syncope due to orthostatic hypotension, still has orthostatics positive, which precludes a safe discharge. Discharge to home, when stable, may need few days to improve.  Subjective: No significant events overnight, patient did work with physical therapy, felt dizzy while standing  up, orthostatics positive. Denies any dysuria, persistent has frequency of urination.  Diarrhea resolved.   Physical Exam: General: NAD, lying comfortably Appear in no distress, affect appropriate Eyes:  PERRLA ENT: Oral Mucosa Clear, moist  Neck: no JVD,  Cardiovascular: S1 and S2 Present, no Murmur,  Respiratory: good respiratory effort, Bilateral Air entry equal and Decreased, no Crackles, no wheezes Abdomen: Bowel Sound present, Soft and no tenderness,  Skin: no rashes Extremities: no Pedal edema, no calf tenderness Neurologic: without any new focal findings Gait not checked due to patient safety concerns  Vitals:   08/31/23 1946 09/01/23 0458 09/01/23 0800 09/01/23 1621  BP:  (!) 126/58 125/63 (!) 151/73  Pulse:  80 85 70  Resp:   16   Temp:  98 F (36.7 C)  97.8 F (36.6 C)  TempSrc:      SpO2: 91% 93% (!) 83% 91%  Weight:      Height:        Intake/Output Summary (Last 24 hours) at 09/01/2023 1640 Last data filed at 09/01/2023 1224 Gross per 24 hour  Intake 680 ml  Output --  Net 680 ml    Filed Weights   08/28/23 1334  Weight: 90.7 kg    Data Reviewed: I have personally reviewed and interpreted daily labs, tele strips, imagings as discussed above. I reviewed all nursing notes, pharmacy notes, vitals, pertinent old records I have discussed plan of care as described above with RN and patient/family.  CBC: Recent Labs  Lab 08/28/23 1352 08/29/23 0428 08/30/23 0449 08/31/23 0501 09/01/23 0452  WBC 7.2 6.2 6.1 6.8 7.2  HGB 12.1 11.2* 10.2* 10.2* 10.2*  HCT 36.8 34.6* 30.7* 30.7* 30.8*  MCV 90.9 91.5 90.8 90.0 89.3  PLT 322 294 288 306 334   Basic Metabolic Panel: Recent Labs  Lab 08/29/23 0428 08/29/23 1808 08/30/23 0449 08/31/23 0501 09/01/23 0452  NA 138 140 138 135 135  K 2.9* 4.3 3.3* 3.8 3.6  CL 101 107 110 106 103  CO2 27 24 21* 20* 24  GLUCOSE 104* 111* 109* 100* 103*  BUN 23 24* 17 11 13   CREATININE 0.77 0.98 0.77 0.74 0.84  CALCIUM  8.7* 9.2 8.5* 8.8* 8.9  MG 1.8  --  1.5* 1.8 1.8  PHOS 3.3  --  2.7 2.7 3.5    Studies: No results found.   Scheduled Meds:  apixaban   5 mg Oral BID   arformoterol   15 mcg Nebulization BID   And    umeclidinium bromide   1 puff Inhalation Daily   aspirin  EC  81 mg Oral QHS   atorvastatin   40 mg Oral Daily   levETIRAcetam   250 mg Oral BID   magnesium  oxide  400 mg Oral BID   midodrine   10 mg Oral TID WC   pantoprazole   40 mg Oral Daily   Continuous Infusions:  cefTRIAXone  (ROCEPHIN )  IV Stopped (09/01/23 0934)   sodium chloride  Stopped (08/28/23 2144)   PRN Meds: acetaminophen  **OR** acetaminophen , ipratropium-albuterol , ondansetron  **OR** ondansetron  (ZOFRAN ) IV, mouth rinse, senna-docusate, zolpidem   Time spent: 40 minutes  Author: ELVAN SOR. MD [facility] 09/01/2023 4:40 PM  To reach On-call, see care teams to locate the attending and reach out to them via [email or web address]. If 7PM-7AM, please contact night-coverage If you still have difficulty reaching the attending provider, please page the [facility] (Director on Call) for [facility] on amion for assistance.

## 2023-09-01 NOTE — Progress Notes (Signed)
 Physical Therapy Treatment Patient Details Name: Diana Cook MRN: [id number] DOB: Jul 10, 1950 Today's Date: 09/01/2023   History of Present Illness 73 y.o. 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    PT Comments  Pt was supine in bed with HOB elevated ~ 20 degrees. She is alert and O x 4. Remains extremely pleasant and motivated however limited by hypotension. Pt was premedicated  with midodrine  ~ 45 minutes prior to session. TED stocking/ binder applied prior to OOB activity without noticeable improvements BP in bed-97/43 (59) Sitting EOB 74/43 (54) pt does not endorse dizziness or blurry vision until she stood  After standing and taking a few steps to recliner pt needed to quickly have BLEs elevated to prevent syncopal event. Once BP obtained 72/44 (59) Pt recovers quickly (symptoms resolved) once reclined in chair. MD/RN made aware. Pt remains motivated and moves well when asymptomatic. Recommend continued skilled PT at DC to maximize pt's activity tolerance and safety with all ADLs.    If plan is discharge home, recommend the following: A little help with walking and/or transfers;A little help with bathing/dressing/bathroom;Assist for transportation;Help with stairs or ramp for entrance     Equipment Recommendations  Wheelchair (measurements PT);Wheelchair cushion (measurements PT) (for if pt has low BP concerns at DC)       Precautions / Restrictions Precautions Precautions: Fall Recall of Precautions/Restrictions: Intact Precaution/Restrictions Comments: MONITOR ORTHOSTATICS Restrictions Weight Bearing Restrictions Per Provider Order: No     Mobility  Bed  Mobility Overal bed mobility: Modified Independent   Transfers Overall transfer level: Needs assistance Equipment used: Rolling walker (2 wheels) Transfers: Bed to chair/wheelchair/BSC Sit to Stand: Supervision  General transfer comment: no physical assistance required however close supervision due to low BP concerns    Ambulation/Gait Ambulation/Gait assistance: Contact guard assist Gait Distance (Feet): 3 Feet Assistive device: Rolling walker (2 wheels) (4 L o2) Gait Pattern/deviations: Step-to pattern Gait velocity: decreased  General Gait Details: no LOB however pt endorses blurry vision/ lightheadedness/ slight dizziness in standing     Balance Overall balance assessment: Needs assistance Sitting-balance support: Feet supported Sitting balance-Leahy Scale: Good     Standing balance support: Bilateral upper extremity supported, During functional activity, Reliant on assistive device for balance Standing balance-Leahy Scale: Fair Standing balance comment: high fall risk due to hypotension       Communication Communication Communication: No apparent difficulties  Cognition Arousal: Alert Behavior During Therapy: WFL for tasks assessed/performed   PT - Cognitive impairments: No apparent impairments    PT - Cognition Comments: Pt is A and O x 4 Following commands: Intact      Cueing Cueing Techniques: Verbal cues, Tactile cues         Pertinent Vitals/Pain Pain Assessment Pain Assessment: No/denies pain Pain Score: 0-No pain     PT Goals (current goals can now be found in the care plan section) Acute Rehab PT Goals Patient Stated Goal: get better so I can go home/beach Progress towards PT goals: Not progressing toward goals - comment (hypotension with symptoms slowing progress)    Frequency    Min 3X/week       Co-evaluation     PT goals addressed during session: Mobility/safety with mobility  AM-PAC PT 6 Clicks Mobility   Outcome  Measure  Help needed turning from your back to your side while in a flat bed without using bedrails?: None Help needed moving from lying on your back to sitting on the side of a flat bed without using bedrails?: None Help needed moving to and from a bed to a chair (including a wheelchair)?: A Little Help needed standing up from a chair using your arms (e.g., wheelchair or bedside chair)?: A Little Help needed to walk in hospital room?: A Little Help needed climbing 3-5 steps with a railing? : A Little 6 Click Score: 20    End of Session Equipment Utilized During Treatment: Oxygen  (4 L at baseline) Activity Tolerance: Patient tolerated treatment well;Treatment limited secondary to medical complications (Comment) (limited by hypotension) Patient left: in chair;with call bell/phone within reach Nurse Communication: Mobility status PT Visit Diagnosis: Other abnormalities of gait and mobility (R26.89);Repeated falls (R29.6);Muscle weakness (generalized) (M62.81);Difficulty in walking, not elsewhere classified (R26.2);Dizziness and giddiness (R42)     Time: 9052-8991 PT Time Calculation (min) (ACUTE ONLY): 21 min  Charges:    $Therapeutic Activity: 8-22 mins PT General Charges $$ ACUTE PT VISIT: 1 Visit                     Rankin Essex PTA 09/01/23, 10:20 AM

## 2023-09-02 DIAGNOSIS — I951 Orthostatic hypotension: Secondary | ICD-10-CM | POA: Diagnosis not present

## 2023-09-02 LAB — CBC
HCT: 31.6 % — ABNORMAL LOW (ref 36.0–46.0)
Hemoglobin: 10.6 g/dL — ABNORMAL LOW (ref 12.0–15.0)
MCH: 30.3 pg (ref 26.0–34.0)
MCHC: 33.5 g/dL (ref 30.0–36.0)
MCV: 90.3 fL (ref 80.0–100.0)
Platelets: 324 K/uL (ref 150–400)
RBC: 3.5 MIL/uL — ABNORMAL LOW (ref 3.87–5.11)
RDW: 12.7 % (ref 11.5–15.5)
WBC: 7.1 K/uL (ref 4.0–10.5)
nRBC: 0 % (ref 0.0–0.2)

## 2023-09-02 LAB — BASIC METABOLIC PANEL WITH GFR
Anion gap: 8 (ref 5–15)
BUN: 14 mg/dL (ref 8–23)
CO2: 22 mmol/L (ref 22–32)
Calcium: 9 mg/dL (ref 8.9–10.3)
Chloride: 104 mmol/L (ref 98–111)
Creatinine, Ser: 0.82 mg/dL (ref 0.44–1.00)
GFR, Estimated: >60 mL/min (ref >60)
Glucose, Bld: 105 mg/dL — ABNORMAL HIGH (ref 70–99)
Potassium: 4.1 mmol/L (ref 3.5–5.1)
Sodium: 134 mmol/L — ABNORMAL LOW (ref 135–145)

## 2023-09-02 LAB — PHOSPHORUS: Phosphorus: 3 mg/dL (ref 2.5–4.6)

## 2023-09-02 LAB — MAGNESIUM: Magnesium: 2 mg/dL (ref 1.7–2.4)

## 2023-09-02 MED ORDER — BISACODYL 5 MG PO TBEC
10.0000 mg | DELAYED_RELEASE_TABLET | Freq: Every day | ORAL | Status: DC
  Administered 2023-09-03 – 2023-09-07 (×5): 10 mg via ORAL
  Filled 2023-09-02 (×5): qty 2

## 2023-09-02 MED ORDER — BISACODYL 5 MG PO TBEC: 10.0000 mg | DELAYED_RELEASE_TABLET | Freq: Every day | ORAL | Status: DC

## 2023-09-02 MED ORDER — POLYETHYLENE GLYCOL 3350 17 G PO PACK: 17.0000 g | PACK | Freq: Every day | ORAL | Status: DC

## 2023-09-02 MED ORDER — BISACODYL 5 MG PO TBEC
10.0000 mg | DELAYED_RELEASE_TABLET | Freq: Once | ORAL | Status: AC
  Administered 2023-09-02: 10 mg via ORAL
  Filled 2023-09-02: qty 2

## 2023-09-02 MED ORDER — CEFADROXIL 500 MG PO CAPS
500.0000 mg | ORAL_CAPSULE | Freq: Two times a day (BID) | ORAL | Status: AC
  Administered 2023-09-03 – 2023-09-05 (×6): 500 mg via ORAL
  Filled 2023-09-02 (×6): qty 1

## 2023-09-02 MED ORDER — BISACODYL 10 MG RE SUPP: 10.0000 mg | Freq: Every day | RECTAL | Status: DC | PRN

## 2023-09-02 MED ORDER — BISACODYL 5 MG PO TBEC: 10.0000 mg | DELAYED_RELEASE_TABLET | Freq: Once | ORAL | Status: DC

## 2023-09-02 MED ORDER — POLYETHYLENE GLYCOL 3350 17 G PO PACK
17.0000 g | PACK | Freq: Two times a day (BID) | ORAL | Status: DC
  Administered 2023-09-02 – 2023-09-08 (×8): 17 g via ORAL
  Filled 2023-09-02 (×11): qty 1

## 2023-09-02 MED ORDER — FLUDROCORTISONE ACETATE 0.1 MG PO TABS
0.1000 mg | ORAL_TABLET | Freq: Every day | ORAL | Status: DC
  Administered 2023-09-02 – 2023-09-05 (×4): 0.1 mg via ORAL
  Filled 2023-09-02 (×4): qty 1

## 2023-09-02 NOTE — Progress Notes (Signed)
Per Dr Dwyane Dee, dc tele monitoring

## 2023-09-02 NOTE — Progress Notes (Signed)
 [facility] Progress Note  Patient: Diana Cook    FMW:[id number]  DOA: 08/28/2023     Date of Service: the patient was seen and examined on 09/02/2023  Chief Complaint  Patient presents with   Loss of Consciousness   Hypotension   Brief hospital course: Diana Cook is a 73 y.o. 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  Patient is having diarrhea for past 1 week, decreased p.o. intake that might be contributing to dehydration and orthostatic hypotension.  ED course: VS Positive orthostatics BMP chloride 97, glucose 116, creatinine 1.02 rest within normal range CBC within normal range  CT head:  small left frontal scalp hematoma but no intracranial abnormalities. CT C-spine with no acute fracture or dislocation.  CTA chest PE study negative for PE.    Pt received IV NS 1 L bolus. TRH was consulted for admission.   Assessment and Plan:  # Orthostatic hypotension most likely due to dehydration secondary to diarrhea and decreased p.o. intake Patient presented with syncope and lightheadedness. Cortisol level within normal range, TSH 0.78 at lower end Continue fall precaution Hold Lasix  for now Held Prozac  as benazepril -TTE earlier this month shows normal EF with G1DD but no valvular abnormalities  -Carotid re-stenosis considered however recent U/S of her carotid shows normal flow hemodynamics  Continue to monitor on telemetry 7/7 started midodrine  5 mg p.o. 3 times daily with holding parameters Continue abdominal binder and TED hose while ambulation Check orthostatics every shift Continue fall precaution Follow PT/OT eval 7/8 increased midodrine   from 5 to 10 mg p.o. 3 times daily with holding parameters due to persistent orthostatic hypotension and feeling dizzy 7/9 started Florinef  0.1 mg p.o. daily due to persistent orthostatic hypotension and dizziness   # UTI, UA positive, started ceftriaxone , urine culture 10K E. coli, sensitive to cephalosporin and Cipro. (Resistant to ampicillin  and Augmentin )  # Hypokalemia, potassium repleted # Hypomagnesemia, mag repleted. Monitor electrolytes and replete as needed.  # COPD # Chronic hypoxic respiratory failure - No respiratory distress, remains on baseline 4 L [place] - Continue home bronchodilator - As needed DuoNebs - Continue supplemental O2   # Chronic diastolic CHF  - TTE LVEF 55 to 60%, G1DD but no valvular abnormalities - Patient dry on exam - Hold as needed Lasix    # Paroxysmal A-fib # Complete heart block s/p PPM - EKG shows atrial-sensed ventricularly-paced sinus rhythm - HR stable in the 70s to 80s - Continue Eliquis    # HLD # CAD/MI # Carotic stenosis s/p  - Continue atorvastatin , aspirin  and Eliquis    # Seizure disorder - Continue Keppra  - Seizure precautions   # GERD - Continue Protonix     Body mass index is 29.53 kg/m.  Interventions:  Diet: Regular DVT Prophylaxis: Eliquis   Advance goals of care discussion: Full code  Family Communication: family was not present at bedside, at the time of interview.  The pt provided permission to discuss medical plan with the family. Opportunity was given to ask question and all questions were answered satisfactorily.   Disposition:  Pt is from home, admitted with syncope due to orthostatic hypotension, still has orthostatics positive, which precludes a safe discharge. Discharge to home, when stable, may  need few days to improve.  Subjective: No significant events overnight, patient did not feel dizzy while ambulation and standing, still having orthostatic symptoms.  Patient agreed to try Florinef  today. Overall  she feels improvement, as she is not passing out   Physical Exam: General: NAD, lying comfortably Appear in no distress, affect appropriate Eyes: PERRLA ENT: Oral Mucosa Clear, moist  Neck: no JVD,  Cardiovascular: S1 and S2 Present, no Murmur,  Respiratory: good respiratory effort, Bilateral Air entry equal and Decreased, no Crackles, no wheezes Abdomen: Bowel Sound present, Soft and no tenderness,  Skin: no rashes Extremities: no Pedal edema, no calf tenderness Neurologic: without any new focal findings Gait not checked due to patient safety concerns  Vitals:   09/02/23 0754 09/02/23 1109 09/02/23 1141 09/02/23 1147  BP: (!) 123/53 (!) 63/52 (!) 117/49 (!) 95/53  Pulse: 73 95 79 81  Resp:   18 17  Temp: 98.4 F (36.9 C)  99.2 F (37.3 C) 97.6 F (36.4 C)  TempSrc: Oral     SpO2: 93% 90% 97% 94%  Weight:      Height:        Intake/Output Summary (Last 24 hours) at 09/02/2023 1608 Last data filed at 09/02/2023 1139 Gross per 24 hour  Intake 553.18 ml  Output --  Net 553.18 ml    Filed Weights   08/28/23 1334  Weight: 90.7 kg    Data Reviewed: I have personally reviewed and interpreted daily labs, tele strips, imagings as discussed above. I reviewed all nursing notes, pharmacy notes, vitals, pertinent old records I have discussed plan of care as described above with RN and patient/family.  CBC: Recent Labs  Lab 08/29/23 0428 08/30/23 0449 08/31/23 0501 09/01/23 0452 09/02/23 0507  WBC 6.2 6.1 6.8 7.2 7.1  HGB 11.2* 10.2* 10.2* 10.2* 10.6*  HCT 34.6* 30.7* 30.7* 30.8* 31.6*  MCV 91.5 90.8 90.0 89.3 90.3  PLT 294 288 306 334 324   Basic Metabolic Panel: Recent Labs  Lab 08/29/23 0428 08/29/23 1808 08/30/23 0449 08/31/23 0501 09/01/23 0452 09/02/23 0507  NA 138 140 138 135 135 134*  K 2.9* 4.3 3.3* 3.8 3.6 4.1  CL 101 107 110 106 103 104  CO2 27 24 21* 20* 24 22  GLUCOSE 104* 111* 109* 100* 103* 105*  BUN 23 24* 17 11 13 14   CREATININE 0.77 0.98  0.77 0.74 0.84 0.82  CALCIUM  8.7* 9.2 8.5* 8.8* 8.9 9.0  MG 1.8  --  1.5* 1.8 1.8 2.0  PHOS 3.3  --  2.7 2.7 3.5 3.0    Studies: No results found.   Scheduled Meds:  apixaban   5 mg Oral BID   arformoterol   15 mcg Nebulization BID   And   umeclidinium bromide   1 puff Inhalation Daily   aspirin  EC  81 mg Oral QHS   atorvastatin   40 mg Oral Daily   bisacodyl   10 mg Oral QHS   levETIRAcetam   250 mg Oral BID   magnesium  oxide  400 mg Oral BID   midodrine   10 mg Oral TID WC   pantoprazole   40 mg Oral Daily   polyethylene glycol  17 g Oral BID   Continuous Infusions:  cefTRIAXone  (ROCEPHIN )  IV Stopped (09/02/23 9081)   sodium chloride  Stopped (08/28/23 2144)   PRN Meds: acetaminophen  **OR** acetaminophen , bisacodyl , ipratropium-albuterol , ondansetron  **OR** ondansetron  (ZOFRAN ) IV, mouth rinse, senna-docusate, zolpidem   Time spent: 40 minutes  Author: ELVAN SOR. MD [facility] 09/02/2023 4:08 PM  To reach On-call, see care teams to locate the attending and reach out to them via [email or web address]. If 7PM-7AM, please contact night-coverage If you still have difficulty reaching the attending provider, please page the [facility] (Director on Call) for [facility] on amion for assistance.

## 2023-09-02 NOTE — Plan of Care (Signed)

## 2023-09-02 NOTE — Progress Notes (Signed)
 Occupational Therapy Treatment Patient Details Name: Diana Cook MRN: [id number] DOB: 12-Dec-1950 Today's Date: 09/02/2023   History of present illness 73 y.o. 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   OT comments  Pt seen for OT treatment on this date. Upon arrival to room pt transferring to [facility] with NT support, agreeable to tx. While seated, vitals assessed at: 67/48 HR: 94seated on BSC, after 3 minutes sitting on BSC: 63/52 HR: 95, after application of abdominal binder: 79/60 HR: 92, and after toileting hygiene/clothing management and transfer to recliner chair with BLEs elevated: 80/48 HR:90. Pt requires CGA for functional transfers on this date with skilled cuing for safety with hand placement and transitions between surfaces with patient reporting exertion and fatigue rather than c/o dizziness.  While seated, patient complete grooming tasks with set-up only.  Pt making progress toward goals with ongoing concerns related to orthostatic hypotension, will continue to follow POC.        If plan is discharge home, recommend the following:      Equipment Recommendations       Recommendations for Other Services      Precautions / Restrictions Precautions Precautions: Fall Recall of Precautions/Restrictions: Intact Precaution/Restrictions Comments: MONITOR ORTHOSTATICS Required Braces or Orthoses: Other Brace Other Brace: abdominal binder       Mobility Bed Mobility                    Transfers Overall transfer level: Needs assistance Equipment used: Rolling walker (2 wheels) Transfers: Bed to chair/wheelchair/BSC Sit to Stand: Contact guard assist      Step pivot transfers: Contact guard assist     General transfer comment: no physical assistance required however CGA due to low BP concerns     Balance Overall balance assessment: Needs assistance Sitting-balance support: Feet supported Sitting balance-Leahy Scale: Good     Standing balance support: Single extremity supported Standing balance-Leahy Scale: Fair Standing balance comment: high fall risk due to hypotension                           ADL either performed or assessed with clinical judgement   ADL Overall ADL's : Needs assistance/impaired     Grooming: Wash/dry face;Set up;Brushing hair;Oral care Grooming Details (indicate cue type and reason): completed seated due to symptoms with prolonged standing                 Toilet Transfer: Contact guard assist;BSC/3in1 Toilet Transfer Details (indicate cue type and reason): verbal cuing Toileting- Clothing Manipulation and Hygiene: Contact guard assist;Sit to/from stand              Extremity/Trunk Assessment Upper Extremity Assessment Upper Extremity Assessment: Generalized weakness            Vision       Perception     Praxis     Communication     Cognition Arousal: Alert Behavior During Therapy: WFL for tasks assessed/performed Cognition: No apparent impairments                               Following commands: Intact  Cueing      Exercises Other Exercises Other Exercises: Patient education on fall prevention, and strategies for positioning to decreased symptoms associated with orthostatic hypotension    Shoulder Instructions       General Comments      Pertinent Vitals/ Pain       Pain Assessment Pain Assessment: No/denies pain  Home Living                                          Prior Functioning/Environment              Frequency  Min 2X/week        Progress Toward Goals  OT Goals(current goals can now be  found in the care plan section)  Progress towards OT goals: Progressing toward goals     Plan      Co-evaluation                 AM-PAC OT 6 Clicks Daily Activity     Outcome Measure                    End of Session Equipment Utilized During Treatment: Rolling walker (2 wheels);Oxygen   OT Visit Diagnosis: Other abnormalities of gait and mobility (R26.89);Repeated falls (R29.6)   Activity Tolerance Treatment limited secondary to medical complications (Comment)   Patient Left in chair;with call bell/phone within reach   Nurse Communication          Time: 8970-8895 OT Time Calculation (min): 35 min  Charges: OT General Charges $OT Visit: 1 Visit  Harlene Sharps OTR/L   Harlene LITTIE Sharps 09/02/2023, 11:17 AM

## 2023-09-03 ENCOUNTER — Ambulatory Visit: Payer: Medicare Other

## 2023-09-03 DIAGNOSIS — I951 Orthostatic hypotension: Secondary | ICD-10-CM | POA: Diagnosis not present

## 2023-09-03 LAB — CBC
HCT: 32 % — ABNORMAL LOW (ref 36.0–46.0)
Hemoglobin: 10.4 g/dL — ABNORMAL LOW (ref 12.0–15.0)
MCH: 29.6 pg (ref 26.0–34.0)
MCHC: 32.5 g/dL (ref 30.0–36.0)
MCV: 91.2 fL (ref 80.0–100.0)
Platelets: 330 K/uL (ref 150–400)
RBC: 3.51 MIL/uL — ABNORMAL LOW (ref 3.87–5.11)
RDW: 12.9 % (ref 11.5–15.5)
WBC: 7.1 K/uL (ref 4.0–10.5)
nRBC: 0 % (ref 0.0–0.2)

## 2023-09-03 LAB — BASIC METABOLIC PANEL WITH GFR
Anion gap: 12 (ref 5–15)
BUN: 19 mg/dL (ref 8–23)
CO2: 21 mmol/L — ABNORMAL LOW (ref 22–32)
Calcium: 9 mg/dL (ref 8.9–10.3)
Chloride: 101 mmol/L (ref 98–111)
Creatinine, Ser: 0.74 mg/dL (ref 0.44–1.00)
GFR, Estimated: >60 mL/min (ref >60)
Glucose, Bld: 108 mg/dL — ABNORMAL HIGH (ref 70–99)
Potassium: 4.2 mmol/L (ref 3.5–5.1)
Sodium: 134 mmol/L — ABNORMAL LOW (ref 135–145)

## 2023-09-03 LAB — PHOSPHORUS: Phosphorus: 3.5 mg/dL (ref 2.5–4.6)

## 2023-09-03 LAB — MAGNESIUM: Magnesium: 2 mg/dL (ref 1.7–2.4)

## 2023-09-03 NOTE — Progress Notes (Signed)
 Physical Therapy Treatment Patient Details Name: Diana Cook MRN: [id number] DOB: 31-Aug-1950 Today's Date: 09/03/2023   History of Present Illness 73 y.o. 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    PT Comments  Pt seated in recliner upon entry, motivated to get better and very agreeable to therapy today. PT focused today's session on increasing activity tolerance while monitoring BP. During session pt was able to perform bed mobility (sit to supine) with Mod independence, performed pre activity with BUE and BLE exercises to pump blood to vital organs to optimize BP for transfer from chair to bed and post exercise to assist with cooling down for end of session.  Pt was also able to perform 4x trials STS with RW and CGA/SBA x2 (1x trial from recliner with SPT towards EOB, and 3x trials from EOB, bed set to lowest setting). Pt reported dizziness and unsteadiness with transfer from recliner to EOB. During therapeutic exercises pt did not note dizziness or hypotensive symptoms with movement. Pre session: BP seated:101/59 (71) , During treatment BP monitored in seated post exercise: 1st STS and SPT from recliner to EOB BP: 98/56 (68), HR: 87 bpm, 2nd STS with marches BP: 95/50 (62), HR: 88 bpm, 3rd STS with marches BP: 100/58 (67), HR: 90 bpm, 4th STS with marches BP: 91/49 (63), HR: 91 bpm,Post session: BP supine:150/75 (96). Pt would benefit from skilled PT to continue to work towards PT goals and return to PLOF.     If plan is discharge home, recommend the following: A little help with walking and/or transfers;A little help with bathing/dressing/bathroom;Assist for transportation;Help  with stairs or ramp for entrance   Can travel by private vehicle        Equipment Recommendations  Wheelchair (measurements PT);Wheelchair cushion (measurements PT)    Recommendations for Other Services       Precautions / Restrictions Precautions Precautions: Fall Recall of Precautions/Restrictions: Intact Precaution/Restrictions Comments: MONITOR ORTHOSTATICS Required Braces or Orthoses: Other Brace (Ted hose, Abdominal brace) Restrictions Weight Bearing Restrictions Per Provider Order: No     Mobility  Bed Mobility Overal bed mobility: Modified Independent                  Transfers Overall transfer level: Needs assistance Equipment used: Rolling walker (2 wheels) Transfers: Sit to/from Stand, Bed to chair/wheelchair/BSC Sit to Stand: Contact guard assist, Supervision, +2 safety/equipment (For increased safety)   Step pivot transfers: Contact guard assist, Supervision, +2 safety/equipment (For increased safety)       General transfer comment: Pt was able to perform 4x trials STS (1x trial from recliner, 3x trials from EOB, bed set to lowest setting), SBA/CGA x2 provided due to low BP concerns. Pt required a seated rest break between trials.    Ambulation/Gait               General Gait Details: Deferred secondary to BP concerns   Stairs             Wheelchair Mobility     Tilt Bed    Modified Rankin (Stroke Patients Only)       Balance Overall balance assessment: Needs assistance Sitting-balance support: Feet supported Sitting balance-Leahy Scale: Good Sitting balance - Comments: Static  seated balance, reaching within BOS   Standing balance support: Single extremity supported Standing balance-Leahy Scale: Fair Standing balance comment: high fall risk due to hypotension, Able to maintain short bouts of dynamic standing balance with marches without syncopal episode.                            Communication  Communication Communication: No apparent difficulties  Cognition Arousal: Alert Behavior During Therapy: WFL for tasks assessed/performed   PT - Cognitive impairments: No apparent impairments                       PT - Cognition Comments: Pt oriented to name and DOB Following commands: Intact      Cueing Cueing Techniques: Verbal cues, Tactile cues  Exercises General Exercises - Upper Extremity Shoulder Flexion: AROM, Strengthening, Both, Seated (1x10 while seated pre transfer, 1x10shruggs while seated EOB following activity.) Elbow Flexion/Extension: AROM, Strengthening, Both, Seated (1x10 while seated pre transfer, 1x10 while seated EOB following activity.) Digit Composite Flexion: AROM, Strengthening, Both, Seated (1x10 while seated pre transfer, 1x10 while seated EOB following activity.) General Exercises - Lower Extremity Ankle Circles/Pumps: AROM, Both, Seated (1x10 while seated pre transfer, 1x10 while seated EOB following activity.) Long Arc Quad: AROM, Strengthening, Both, Seated (1x10 while seated pre transfer, 1x10 while seated EOB following activity.) Hip Flexion/Marching: AROM, Strengthening, Both (1x10 while seated pre transfer, 1x10 while seated EOB following activity. 2x20 standing with RW, 1x30 standing with RW)    General Comments General comments (skin integrity, edema, etc.): Pre session: HR: 82 bpm, SpO2: 90% on 4.5L O2 via [place], BP seated:101/59 (71) ,Post session: HR: 72 bpm, SpO2: 92% on 4.5L O2 via [place], BP supine:150/75 (96). Pt educated on the importance of warm up there ex prior to positional changes to prime body for movement due to hypotension concerns.      Pertinent Vitals/Pain Pain Assessment Pain Assessment: No/denies pain Pain Intervention(s): Limited activity within patient's tolerance, Monitored during session    Home Living                          Prior Function            PT Goals (current goals can now be found in the  care plan section) Acute Rehab PT Goals Patient Stated Goal: get better so I can go home/beach PT Goal Formulation: With patient Time For Goal Achievement: 09/12/23 Potential to Achieve Goals: Fair Progress towards PT goals: Progressing toward goals    Frequency    Min 3X/week      PT Plan      Co-evaluation              AM-PAC PT 6 Clicks Mobility   Outcome Measure  Help needed turning from your back to your side while in a flat bed without using bedrails?: None Help needed moving from lying on your back to sitting on the side of a flat bed without using bedrails?: None Help needed moving to and from a bed to a chair (including a wheelchair)?: A Little Help needed standing up from a chair using your arms (e.g., wheelchair or bedside chair)?: A Little Help needed to walk in hospital room?: A Little Help needed climbing 3-5 steps with a railing? : A Little 6 Click Score: 20    End of Session Equipment Utilized During Treatment: Oxygen  (4.5L), TED hose, Abdominal binder  Activity Tolerance: Patient tolerated treatment well;Treatment limited secondary to medical complications (Comment) (Hypotension) Patient left: in chair;with call bell/phone within reach;with chair alarm set (With TED hose and socks) Nurse Communication: Mobility status;Other (comment) (BP during session) PT Visit Diagnosis: Other abnormalities of gait and mobility (R26.89);Repeated falls (R29.6);Muscle weakness (generalized) (M62.81);Difficulty in walking, not elsewhere classified (R26.2);Dizziness and giddiness (R42)     Time: 8671-8595 PT Time Calculation (min) (ACUTE ONLY): 36 min  Charges:                            Keng Jewel, SPT 09/03/23, 3:53 PM

## 2023-09-03 NOTE — Progress Notes (Signed)
 Occupational Therapy Treatment Patient Details Name: Diana Cook MRN: [id number] DOB: 1950-11-06 Today's Date: 09/03/2023   History of present illness 73 y.o. 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   OT comments  Pt seen for OT treatment on this date. Upon arrival to room pt seated in recliner chair and patient's spouse present, agreeable to tx.  Pt reported being placed on two new medications to support management of OH symptoms.  Therapist assessed BP while seated: 124/62, HR: 74, O2: 85 (increasing to 92% with cuing for breathing techniques), Standing for +45 sec-1 min BP: 81/50, HR: 80 with OH symptoms beginning, and finally seated: 115/54 with orthostatic hypotension management exercises directed by therapist.  Education provided to patient and spouse on the benefits of these exercises prior to standing activities.  At end of session, patient seated in recliner with BLEs elevated, call button with in reach and spouse present.        If plan is discharge home, recommend the following:      Equipment Recommendations       Recommendations for Other Services      Precautions / Restrictions Precautions Precautions: Fall Precaution/Restrictions Comments: MONITOR ORTHOSTATICS Required Braces or Orthoses: Other Brace (abdominal brace, ted hose) Other Brace: abdominal binder and ted hose       Mobility Bed Mobility                    Transfers   Equipment used: Rolling walker (2 wheels)               General transfer comment: no physical assistance required however SBA due to low BP concerns     Balance Overall balance assessment: Needs  assistance         Standing balance support: Single extremity supported Standing balance-Leahy Scale: Fair                             ADL either performed or assessed with clinical judgement   ADL                                         General ADL Comments: BP/Orthostatic hypotension remain a barrier for standing components of ADL engagement    Extremity/Trunk Assessment Upper Extremity Assessment Upper Extremity Assessment: Generalized weakness            Vision Baseline Vision/History: 0 No visual deficits     Perception     Praxis     Communication     Cognition Arousal: Alert Behavior During Therapy: WFL for tasks assessed/performed Cognition: No apparent impairments                                        Cueing      Exercises Total Joint Exercises Ankle Circles/Pumps: AROM, Right, Left, 20 reps, Seated Other Exercises Other Exercises: Patient education on fall prevention, and strategies for positioning to decreased symptoms associated with orthostatic hypotension specifically educated on ankle pumps  prior to standing to support management of orthostatic hypotension.    Shoulder Instructions       General Comments      Pertinent Vitals/ Pain       Pain Assessment Pain Assessment: No/denies pain  Home Living                                          Prior Functioning/Environment              Frequency  Min 2X/week        Progress Toward Goals  OT Goals(current goals can now be found in the care plan section)  Progress towards OT goals: Progressing toward goals     Plan      Co-evaluation                 AM-PAC OT 6 Clicks Daily Activity     Outcome Measure   Help from another person eating meals?: None Help from another person taking care of personal grooming?: None Help from another person toileting, which includes using toliet, bedpan, or urinal?: A  Little Help from another person bathing (including washing, rinsing, drying)?: A Little Help from another person to put on and taking off regular upper body clothing?: None Help from another person to put on and taking off regular lower body clothing?: A Little 6 Click Score: 21    End of Session Equipment Utilized During Treatment: Rolling walker (2 wheels);Oxygen ;Gait belt  OT Visit Diagnosis: Other abnormalities of gait and mobility (R26.89);Repeated falls (R29.6)   Activity Tolerance Treatment limited secondary to medical complications (Comment)   Patient Left in chair;with call bell/phone within reach   Nurse Communication          Time: 1211-1238 OT Time Calculation (min): 27 min  Charges: OT General Charges $OT Visit: 1 Visit  Diana Cook OTR/L   Diana LITTIE Cook 09/03/2023, 1:02 PM

## 2023-09-03 NOTE — Plan of Care (Signed)

## 2023-09-03 NOTE — Progress Notes (Signed)
 [facility] Progress Note  Patient: Diana Cook    FMW:[id number]  DOA: 08/28/2023     Date of Service: the patient was seen and examined on 09/03/2023  Chief Complaint  Patient presents with   Loss of Consciousness   Hypotension   Brief hospital course: Diana Cook is a 73 y.o. 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  Patient is having diarrhea for past 1 week, decreased p.o. intake that might be contributing to dehydration and orthostatic hypotension.  ED course: VS Positive orthostatics BMP chloride 97, glucose 116, creatinine 1.02 rest within normal range CBC within normal range  CT head:  small left frontal scalp hematoma but no intracranial abnormalities. CT C-spine with no acute fracture or dislocation.  CTA chest PE study negative for PE.    Pt received IV NS 1 L bolus. TRH was consulted for admission.   Assessment and Plan:  # Orthostatic hypotension most likely due to dehydration secondary to diarrhea and decreased p.o. intake Patient presented with syncope and lightheadedness. Cortisol level within normal range, TSH 0.78 at lower end Continue fall precaution Hold Lasix  for now Held Prozac  as benazepril -TTE earlier this month shows normal EF with G1DD but no valvular abnormalities  -Carotid re-stenosis considered however recent U/S of her carotid shows normal flow hemodynamics  Continue to monitor on telemetry 7/7 started midodrine  5 mg p.o. 3 times daily with holding parameters Continue abdominal binder and TED hose while ambulation 7/8 increased midodrine  from 5 to 10 mg p.o. 3 times daily with holding parameters due to persistent  orthostatic hypotension and feeling dizzy 7/9 started Florinef  0.1 mg p.o. daily due to persistent orthostatic hypotension and dizziness Check orthostatics every shift Continue fall precaution Follow PT/OT eval  # UTI, UA positive, s/p ceftriaxone , urine culture 10K E. coli, sensitive to cephalosporin and Cipro. (Resistant to ampicillin  and Augmentin ) 7/10 started cefadroxil  500 mg p.o. twice daily for 3 more days   # Hypokalemia, potassium repleted # Hypomagnesemia, mag repleted. Monitor electrolytes and replete as needed.  # COPD # Chronic hypoxic respiratory failure - No respiratory distress, remains on baseline 4 L [place] - Continue home bronchodilator - As needed DuoNebs - Continue supplemental O2   # Chronic diastolic CHF  - TTE LVEF 55 to 60%, G1DD but no valvular abnormalities - Patient dry on exam - Hold as needed Lasix    # Paroxysmal A-fib # Complete heart block s/p PPM - EKG shows atrial-sensed ventricularly-paced sinus rhythm - HR stable in the 70s to 80s - Continue Eliquis    # HLD # CAD/MI # Carotic stenosis s/p  - Continue atorvastatin , aspirin  and Eliquis    # Seizure disorder - Continue Keppra  - Seizure precautions   # GERD - Continue Protonix     Body mass index is 29.53 kg/m.  Interventions:  Diet: Regular DVT Prophylaxis: Eliquis   Advance goals of care discussion: Full code  Family Communication: family was not present at bedside, at the time of interview.  The pt provided permission to discuss medical plan with the family. Opportunity was given to ask question and all questions were answered satisfactorily.   Disposition:  Pt is from home, admitted with syncope due to orthostatic hypotension, still has orthostatics  positive, which precludes a safe discharge. Discharge to SNF, when stable, may need few days to improve.  Subjective: No significant events overnight, patient did work with physical therapy, was transferred from bed to bedside  recliner, she felt a little bit dizzy but overall she feels improvement, denied any specific complaints.   Physical Exam: General: NAD, lying comfortably Appear in no distress, affect appropriate Eyes: PERRLA ENT: Oral Mucosa Clear, moist  Neck: no JVD,  Cardiovascular: S1 and S2 Present, no Murmur,  Respiratory: good respiratory effort, Bilateral Air entry equal and Decreased, no Crackles, no wheezes Abdomen: Bowel Sound present, Soft and no tenderness,  Skin: no rashes Extremities: no Pedal edema, no calf tenderness Neurologic: without any new focal findings Gait not checked due to patient safety concerns  Vitals:   09/03/23 0513 09/03/23 1028 09/03/23 1250 09/03/23 1553  BP: (!) 129/57 (!) 100/48 124/62 (!) 154/74  Pulse: 74 88  69  Resp:    20  Temp: 98.2 F (36.8 C)   98.2 F (36.8 C)  TempSrc:      SpO2: 91% 90% 92% 91%  Weight:      Height:        Intake/Output Summary (Last 24 hours) at 09/03/2023 1637 Last data filed at 09/03/2023 1449 Gross per 24 hour  Intake 840 ml  Output --  Net 840 ml    Filed Weights   08/28/23 1334  Weight: 90.7 kg    Data Reviewed: I have personally reviewed and interpreted daily labs, tele strips, imagings as discussed above. I reviewed all nursing notes, pharmacy notes, vitals, pertinent old records I have discussed plan of care as described above with RN and patient/family.  CBC: Recent Labs  Lab 08/30/23 0449 08/31/23 0501 09/01/23 0452 09/02/23 0507 09/03/23 0458  WBC 6.1 6.8 7.2 7.1 7.1  HGB 10.2* 10.2* 10.2* 10.6* 10.4*  HCT 30.7* 30.7* 30.8* 31.6* 32.0*  MCV 90.8 90.0 89.3 90.3 91.2  PLT 288 306 334 324 330   Basic Metabolic Panel: Recent Labs  Lab 08/30/23 0449 08/31/23 0501 09/01/23 0452 09/02/23 0507 09/03/23 0458  NA 138 135 135 134* 134*  K 3.3* 3.8 3.6 4.1 4.2  CL 110 106 103 104 101  CO2 21* 20* 24 22 21*  GLUCOSE 109* 100* 103* 105* 108*  BUN 17 11 13 14 19   CREATININE 0.77 0.74 0.84 0.82  0.74  CALCIUM  8.5* 8.8* 8.9 9.0 9.0  MG 1.5* 1.8 1.8 2.0 2.0  PHOS 2.7 2.7 3.5 3.0 3.5    Studies: No results found.   Scheduled Meds:  apixaban   5 mg Oral BID   arformoterol   15 mcg Nebulization BID   And   umeclidinium bromide   1 puff Inhalation Daily   aspirin  EC  81 mg Oral QHS   atorvastatin   40 mg Oral Daily   bisacodyl   10 mg Oral QHS   cefadroxil   500 mg Oral BID   fludrocortisone   0.1 mg Oral Daily   levETIRAcetam   250 mg Oral BID   magnesium  oxide  400 mg Oral BID   midodrine   10 mg Oral TID WC   pantoprazole   40 mg Oral Daily   polyethylene glycol  17 g Oral BID   Continuous Infusions:  sodium chloride  Stopped (08/28/23 2144)   PRN Meds: acetaminophen  **OR** acetaminophen , bisacodyl , ipratropium-albuterol , ondansetron  **OR** ondansetron  (ZOFRAN ) IV, mouth rinse, senna-docusate, zolpidem   Time spent: 40 minutes  Author: ELVAN SOR. MD [facility] 09/03/2023 4:37 PM  To reach On-call,  see care teams to locate the attending and reach out to them via [email or web address]. If 7PM-7AM, please contact night-coverage If you still have difficulty reaching the attending provider, please page the [facility] (Director on Call) for [facility] on amion for assistance.

## 2023-09-04 DIAGNOSIS — I951 Orthostatic hypotension: Secondary | ICD-10-CM | POA: Diagnosis not present

## 2023-09-04 LAB — BASIC METABOLIC PANEL WITH GFR
Anion gap: 10 (ref 5–15)
BUN: 14 mg/dL (ref 8–23)
CO2: 22 mmol/L (ref 22–32)
Calcium: 8.9 mg/dL (ref 8.9–10.3)
Chloride: 101 mmol/L (ref 98–111)
Creatinine, Ser: 0.87 mg/dL (ref 0.44–1.00)
GFR, Estimated: >60 mL/min (ref >60)
Glucose, Bld: 110 mg/dL — ABNORMAL HIGH (ref 70–99)
Potassium: 4.3 mmol/L (ref 3.5–5.1)
Sodium: 133 mmol/L — ABNORMAL LOW (ref 135–145)

## 2023-09-04 LAB — PHOSPHORUS: Phosphorus: 3.3 mg/dL (ref 2.5–4.6)

## 2023-09-04 LAB — CBC
HCT: 30.9 % — ABNORMAL LOW (ref 36.0–46.0)
Hemoglobin: 10 g/dL — ABNORMAL LOW (ref 12.0–15.0)
MCH: 29.2 pg (ref 26.0–34.0)
MCHC: 32.4 g/dL (ref 30.0–36.0)
MCV: 90.4 fL (ref 80.0–100.0)
Platelets: 362 K/uL (ref 150–400)
RBC: 3.42 MIL/uL — ABNORMAL LOW (ref 3.87–5.11)
RDW: 12.8 % (ref 11.5–15.5)
WBC: 7 K/uL (ref 4.0–10.5)
nRBC: 0 % (ref 0.0–0.2)

## 2023-09-04 LAB — MAGNESIUM: Magnesium: 2 mg/dL (ref 1.7–2.4)

## 2023-09-04 NOTE — Plan of Care (Signed)

## 2023-09-04 NOTE — Plan of Care (Signed)
  Problem: Clinical Measurements: Goal: Ability to maintain clinical measurements within normal limits will improve Outcome: Progressing Goal: Diagnostic test results will improve Outcome: Progressing   Problem: Coping: Goal: Level of anxiety will decrease Outcome: Progressing   Problem: Skin Integrity: Goal: Risk for impaired skin integrity will decrease Outcome: Progressing

## 2023-09-04 NOTE — TOC Initial Note (Addendum)
 Transition of Care [facility]) - Initial/Assessment Note    Patient Details  Name: Diana Cook MRN: [id number] Date of Birth: [birth year]  [facility]) CM/SW Contact:    Elouise LULLA Capri, RN 09/04/2023, 2:20 PM  Clinical Narrative:                  CM to patient's room regarding case management assessment. Patient lives with husband, Elsie. Per patient has access to DME: walker, bedside commode, shower chair. Per patient has previous home health experience. CM and patient  discussed home health recommendations. Per patient, will research name of previous home health provider and call CM. CM provided contact number for follow up call.   Order for home health PT/OT   Expected Discharge Plan: Home w Home Health Services Barriers to Discharge: Continued Medical Work up   Patient Goals and CMS Choice    Home health  Expected Discharge Plan and Services    Home health   Living arrangements for the past 2 months: Single Family Home                   Prior Living Arrangements/Services Living arrangements for the past 2 months: Single Family Home Lives with:: Spouse Patient language and need for interpreter reviewed:: No Do you feel safe going back to the place where you live?: Yes      Need for Family Participation in Patient Care: Yes (Comment) Care giver support system in place?: Yes (comment) Current home services: DME (shower chair, bsc, walker)    Activities of Daily Living   ADL Screening (condition at time of admission) Independently performs ADLs?: Yes (appropriate for developmental age) Is the patient deaf or have difficulty hearing?: Yes Does the patient have difficulty seeing, even when wearing glasses/contacts?: Yes Does the patient have difficulty concentrating, remembering, or making decisions?: No  Permission Sought/Granted      Share Information with NAME: Roselee Tayloe     Permission granted to share info w Relationship: Husband      Emotional Assessment   Attitude/Demeanor/Rapport: Engaged Affect (typically observed): Calm Orientation: : Oriented to Self, Oriented to Place, Oriented to  Time, Oriented to Situation      Admission diagnosis:  Orthostatic hypotension [I95.1] Injury of head, initial encounter [S09.90XA] Fall, initial encounter [W19.XXXA] Patient Active Problem List   Diagnosis Date Noted   Syncope 08/29/2023   Orthostatic hypotension 08/28/2023   Decreased GFR 04/29/2023   Presence of heart assist device (HCC) 03/25/2023   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/25/2023   Persistent cough 03/03/2023   Urinary urgency 02/09/2023   Bilateral carotid artery stenosis 04/29/2022   Carotid stenosis, symptomatic w/o infarct 03/23/2022   Right shoulder pain 11/25/2021   Mild anemia 11/03/2021   Thyroid  nodule 09/17/2021   Dyslipidemia 09/05/2021   Depression 09/05/2021   GERD without esophagitis 09/05/2021   Vitamin D  deficiency 08/20/2021   Colon cancer screening 07/30/2021   Hypercalcemia 07/29/2021   UTI (urinary tract infection) 07/17/2021   Dysuria 07/08/2021   Arterial hypotension 07/08/2021   Paroxysmal atrial fibrillation (HCC) 04/11/2021   Current use of proton pump inhibitor 10/24/2020   Sleep apnea 10/24/2020   Fatigue 10/24/2020   Lightheadedness 05/16/2020   Radicular pain in left arm 03/02/2020   Neck pain on left side 02/15/2020   Paresthesia 02/15/2020   Chronic respiratory failure with hypoxia (HCC) 11/17/2019   Chronic heart failure with preserved ejection fraction (HFpEF) (HCC) 08/11/2019   Coronary artery disease  involving native coronary artery of native heart without angina pectoris 06/20/2019   Gastroesophageal reflux disease    Stomach irritation    Abnormal CT scan, esophagus    Polyp of ascending colon    Personal history of tobacco use, presenting hazards to health 01/18/2018   Grade I diastolic dysfunction 12/21/2017   Globus pharyngeus 12/21/2017    Chronic obstructive pulmonary disease (HCC) 12/15/2017   Hyperlipidemia 12/07/2017   Seizures (HCC) 10/01/2016   Elevated glucose 08/31/2016   Grief reaction 04/11/2016   History of breast cancer 12/11/2015   Osteoporosis 11/22/2015   Estrogen deficiency 09/07/2015   Wrinkles 09/07/2015   Post herpetic neuralgia 09/01/2014   Encounter for Medicare annual wellness exam 07/11/2014   Routine general medical examination at a health care facility 06/26/2013   H/O small bowel obstruction 04/15/2013   SOB (shortness of breath) 02/10/2013   Complete heart block (HCC) 11/03/2011   Pacemaker-[employer] 11/03/2011   Lumbar spinal stenosis 09/02/2011   POSTMENOPAUSAL STATUS 08/06/2009   HIDRADENITIS SUPPURATIVA 06/26/2008   HERNIATED DISC 12/14/2007   BACK, LOWER, PAIN 12/02/2007   Hyperkalemia 08/06/2007   History of colonic polyps 09/14/2006   Former smoker 06/10/2006   Asthma 06/10/2006   H/O idiopathic seizure 06/10/2006   PCP:  Randeen Laine LABOR, MD Pharmacy:   [facility] [address], [place] - [address] [place] [address] Phone: [phone] Fax: [phone]     Social Drivers of Health (SDOH) Social History: SDOH Screenings   Food Insecurity: No Food Insecurity (08/28/2023)  Housing: Low Risk  (08/28/2023)  Transportation Needs: No Transportation Needs (08/28/2023)  Utilities: Not At Risk (08/28/2023)  Alcohol Screen: Low Risk  (05/13/2023)  Depression (PHQ2-9): Low Risk  (05/08/2023)  Recent Concern: Depression (PHQ2-9) - Medium Risk (03/25/2023)  Financial Resource Strain: Low Risk  (05/13/2023)  Physical Activity: Inactive (05/13/2023)  Social Connections: Socially Integrated (08/28/2023)  Stress: No Stress Concern Present (05/13/2023)  Tobacco Use: Medium Risk (08/28/2023)  Health Literacy: Adequate Health Literacy (03/06/2023)   SDOH Interventions:     Readmission Risk Interventions     No data to display

## 2023-09-04 NOTE — Progress Notes (Signed)
 Progress Note   Patient: Diana Cook FMW:[id number] DOB: 09/24/1950 DOA: 08/28/2023     6 DOS: the patient was seen and examined on 09/04/2023   Brief hospital course: Diana Cook is a 73 y.o. 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  Patient is having diarrhea for past 1 week, decreased p.o. intake that might be contributing to dehydration and orthostatic hypotension.   ED course: VS Positive orthostatics BMP chloride 97, glucose 116, creatinine 1.02 rest within normal range CBC within normal range   CT head:  small left frontal scalp hematoma but no intracranial abnormalities. CT C-spine with no acute fracture or dislocation.  CTA chest PE study negative for PE.    Pt received IV NS 1 L bolus. TRH was consulted for admission.      Assessment and Plan:  # Orthostatic hypotension most likely due to dehydration secondary to diarrhea and decreased p.o. intake Patient presented with syncope and lightheadedness. Cortisol level within normal range, TSH 0.78 at lower end Continue fall precaution Continue to hold Lasix , Prozac  and benazepril -TTE earlier this month shows normal EF with G1DD but no valvular abnormalities  -Carotid re-stenosis considered however recent U/S of her carotid shows normal flow hemodynamics  Continue to monitor on telemetry 7/7 started midodrine  5 mg p.o. 3 times daily with holding parameters Continue abdominal binder and TED hose while ambulation 7/8 increased midodrine  from 5 to 10 mg p.o. 3 times daily with holding parameters due to persistent orthostatic hypotension and feeling dizzy 7/9 started Florinef  0.1 mg p.o. daily due to  persistent orthostatic hypotension and dizziness 7/11 remains orthostatic.   # UTI, UA positive, s/p ceftriaxone , urine culture 10K E. coli, sensitive to cephalosporin and Cipro. (Resistant to ampicillin  and Augmentin ) 7/10 started cefadroxil  500 mg p.o. twice daily for 3 more days   # Hypokalemia, potassium repleted # Hypomagnesemia, mag repleted. Monitor electrolytes and replete as needed.   # COPD # Chronic hypoxic respiratory failure - No respiratory distress, remains on baseline 4 L [place] - Continue home bronchodilator - As needed DuoNebs - Continue supplemental O2   # Chronic diastolic CHF  - TTE LVEF 55 to 60%, G1DD but no valvular abnormalities - Patient dry on exam - Hold as needed Lasix    # Paroxysmal A-fib # Complete heart block s/p PPM - EKG shows atrial-sensed ventricularly-paced sinus rhythm - HR stable in the 70s to 80s - Continue Eliquis    # HLD # CAD/MI # Carotic stenosis s/p  - Continue atorvastatin , aspirin  and Eliquis    # Seizure disorder - Continue Keppra  - Seizure precautions   # GERD - Continue Protonix           Subjective: No new complaints.  Physical Exam: Vitals:   09/03/23 2039 09/04/23 0549 09/04/23 0723 09/04/23 0726  BP: 137/76 (!) 123/59 116/63   Pulse: 71 72 73   Resp: 18 18 16    Temp: 97.6 F (36.4 C)  98 F (36.7 C)   TempSrc:      SpO2: 92% 93% 91% 90%  Weight:      Height:       General: NAD, lying comfortably Appear in no distress, affect appropriate Eyes pale conjunctiva ENT: Oral Mucosa Clear,  moist  Neck: no JVD,  Cardiovascular: S1 and S2 Present, no Murmur,  Respiratory: good respiratory effort, Bilateral Air entry equal and Decreased, no Crackles, no wheezes Abdomen: Bowel Sound present, Soft and no tenderness,  Skin: no rashes Extremities: no Pedal edema, no calf tenderness Neurologic: without any new focal findings Gait not checked due to patient safety concerns   Data Reviewed: Sodium 133,  hemoglobin 10.0 Labs reviewed  Family Communication: Plan of care was discussed with patient at the bedside.  She verbalizes understanding and agrees with plan.  Disposition: Status is: Inpatient Remains inpatient appropriate because: Continues to have orthostatic blood pressure changes  Planned Discharge Destination: TBD    Time spent: 40 minutes  Author: Aimee Somerset, MD 09/04/2023 12:37 PM  For on call review [email or web address].

## 2023-09-05 DIAGNOSIS — I951 Orthostatic hypotension: Secondary | ICD-10-CM | POA: Diagnosis not present

## 2023-09-05 LAB — BASIC METABOLIC PANEL WITH GFR
Anion gap: 10 (ref 5–15)
BUN: 14 mg/dL (ref 8–23)
CO2: 25 mmol/L (ref 22–32)
Calcium: 9.2 mg/dL (ref 8.9–10.3)
Chloride: 102 mmol/L (ref 98–111)
Creatinine, Ser: 0.91 mg/dL (ref 0.44–1.00)
GFR, Estimated: >60 mL/min (ref >60)
Glucose, Bld: 110 mg/dL — ABNORMAL HIGH (ref 70–99)
Potassium: 4.3 mmol/L (ref 3.5–5.1)
Sodium: 137 mmol/L (ref 135–145)

## 2023-09-05 LAB — CBC
HCT: 32.2 % — ABNORMAL LOW (ref 36.0–46.0)
Hemoglobin: 10.3 g/dL — ABNORMAL LOW (ref 12.0–15.0)
MCH: 29.1 pg (ref 26.0–34.0)
MCHC: 32 g/dL (ref 30.0–36.0)
MCV: 91 fL (ref 80.0–100.0)
Platelets: 373 K/uL (ref 150–400)
RBC: 3.54 MIL/uL — ABNORMAL LOW (ref 3.87–5.11)
RDW: 12.8 % (ref 11.5–15.5)
WBC: 6.9 K/uL (ref 4.0–10.5)
nRBC: 0 % (ref 0.0–0.2)

## 2023-09-05 MED ORDER — FLUDROCORTISONE ACETATE 0.1 MG PO TABS: 0.2000 mg | ORAL_TABLET | Freq: Every day | ORAL | Status: DC

## 2023-09-05 MED ORDER — FLUDROCORTISONE ACETATE 0.1 MG PO TABS
0.1000 mg | ORAL_TABLET | Freq: Two times a day (BID) | ORAL | Status: DC
  Administered 2023-09-05 – 2023-09-10 (×10): 0.1 mg via ORAL
  Filled 2023-09-05 (×10): qty 1

## 2023-09-05 NOTE — Progress Notes (Signed)
 Progress Note   Patient: Diana Cook FMW:[id number] DOB: 1950/03/31 DOA: 08/28/2023     7 DOS: the patient was seen and examined on 09/05/2023   Brief hospital course: Diana Cook is a 73 y.o. 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  Patient is having diarrhea for past 1 week, decreased p.o. intake that might be contributing to dehydration and orthostatic hypotension.   ED course: VS Positive orthostatics BMP chloride 97, glucose 116, creatinine 1.02 rest within normal range CBC within normal range   CT head:  small left frontal scalp hematoma but no intracranial abnormalities. CT C-spine with no acute fracture or dislocation.  CTA chest PE study negative for PE.   Pt received IV NS 1 L bolus. TRH was consulted for admission.    Assessment and Plan:  # Orthostatic hypotension most likely due to dehydration secondary to diarrhea and decreased p.o. intake Patient presented with syncope and lightheadedness. Cortisol level within normal range, TSH 0.78 at lower end Continue fall precaution Continue to hold Lasix , Prozac  and benazepril -TTE earlier this month shows normal EF with G1DD but no valvular abnormalities  -Carotid re-stenosis considered however recent U/S of her carotid shows normal flow hemodynamics  Continue to monitor on telemetry 7/7 started midodrine  5 mg p.o. 3 times daily with holding parameters Continue abdominal binder and TED hose while ambulation 7/8 increased midodrine  from 5 to 10 mg p.o. 3 times daily with holding parameters due to persistent orthostatic hypotension and feeling dizzy 7/9 started Florinef  0.1 mg p.o. daily due to  persistent orthostatic hypotension and dizziness 7/11 remains orthostatic. 7/12 increase Florinef  0.1 mg p.o. twice daily    # UTI, UA positive, s/p ceftriaxone , urine culture 10K E. coli, sensitive to cephalosporin and Cipro. (Resistant to ampicillin  and Augmentin ) 7/10 started cefadroxil  500 mg p.o. twice daily for 3 more days   # Hypokalemia, potassium repleted # Hypomagnesemia, mag repleted. Monitor electrolytes and replete as needed.   # COPD # Chronic hypoxic respiratory failure - No respiratory distress, remains on baseline 4 L [place] - Continue home bronchodilator - As needed DuoNebs - Continue supplemental O2   # Chronic diastolic CHF  - TTE LVEF 55 to 60%, G1DD but no valvular abnormalities - Patient dry on exam - Hold as needed Lasix    # Paroxysmal A-fib # Complete heart block s/p PPM - EKG shows atrial-sensed ventricularly-paced sinus rhythm - HR stable in the 70s to 80s - Continue Eliquis    # HLD # CAD/MI # Carotic stenosis s/p  - Continue atorvastatin , aspirin  and Eliquis    # Seizure disorder - Continue Keppra  - Seizure precautions   # GERD - Continue Protonix      Subjective: No significant events overnight, patient feels improvement in her symptoms, she is not feeling as much dizzy as she was feeling before.  Patient has not walked yet in the hallway. Patient was advised to ambulate today and tomorrow and then we can plan for disposition on Monday accordingly.  Physical Exam: Vitals:   09/04/23 2015 09/05/23 0326 09/05/23 0711 09/05/23 0748  BP: (!) 152/67 (!) 126/59  (!) 128/58  Pulse: 75 77  72  Resp: 18 18  16   Temp: 98.4 F (36.9 C)  98.2 F (36.8 C)  98.1 F (36.7 C)  TempSrc:    Oral  SpO2: 91% 91% 90% 95%  Weight:      Height:       General: NAD, lying comfortably Appear in no distress, affect appropriate Eyes pale conjunctiva ENT: Oral Mucosa Clear, moist  Neck: no JVD,  Cardiovascular: S1 and S2 Present, no Murmur,  Respiratory:  good respiratory effort, Bilateral Air entry equal and Decreased, no Crackles, no wheezes Abdomen: Bowel Sound present, Soft and no tenderness,  Skin: no rashes Extremities: no Pedal edema, no calf tenderness Neurologic: without any new focal findings Gait not checked due to patient safety concerns   Data Reviewed: Labs reviewed  Family Communication: Plan of care was discussed with patient at the bedside.  She verbalizes understanding and agrees with plan.  Disposition: Status is: Inpatient Remains inpatient appropriate because: Continues to have orthostatic blood pressure changes  Planned Discharge Destination: TBD    Time spent: 40 minutes  Author: Elvan Sor, MD 09/05/2023 3:44 PM  For on call review [email or web address].

## 2023-09-05 NOTE — Plan of Care (Signed)
   Problem: Education: Goal: Knowledge of General Education information will improve Description: Including pain rating scale, medication(s)/side effects and non-pharmacologic comfort measures Outcome: Progressing   Problem: Health Behavior/Discharge Planning: Goal: Ability to manage health-related needs will improve Outcome: Progressing   Problem: Activity: Goal: Risk for activity intolerance will decrease Outcome: Progressing   Problem: Nutrition: Goal: Adequate nutrition will be maintained Outcome: Progressing   Problem: Coping: Goal: Level of anxiety will decrease Outcome: Progressing   Problem: Pain Managment: Goal: General experience of comfort will improve and/or be controlled Outcome: Progressing   Problem: Safety: Goal: Ability to remain free from injury will improve Outcome: Progressing   Problem: Skin Integrity: Goal: Risk for impaired skin integrity will decrease Outcome: Progressing

## 2023-09-05 NOTE — Plan of Care (Signed)
  Problem: Health Behavior/Discharge Planning: Goal: Ability to manage health-related needs will improve Outcome: Progressing   Problem: Clinical Measurements: Goal: Respiratory complications will improve Outcome: Progressing   Problem: Nutrition: Goal: Adequate nutrition will be maintained Outcome: Progressing   Problem: Elimination: Goal: Will not experience complications related to bowel motility Outcome: Progressing

## 2023-09-06 DIAGNOSIS — I951 Orthostatic hypotension: Secondary | ICD-10-CM | POA: Diagnosis not present

## 2023-09-06 LAB — BASIC METABOLIC PANEL WITH GFR
Anion gap: 8 (ref 5–15)
BUN: 15 mg/dL (ref 8–23)
CO2: 25 mmol/L (ref 22–32)
Calcium: 8.9 mg/dL (ref 8.9–10.3)
Chloride: 101 mmol/L (ref 98–111)
Creatinine, Ser: 0.75 mg/dL (ref 0.44–1.00)
GFR, Estimated: >60 mL/min (ref >60)
Glucose, Bld: 114 mg/dL — ABNORMAL HIGH (ref 70–99)
Potassium: 3.9 mmol/L (ref 3.5–5.1)
Sodium: 134 mmol/L — ABNORMAL LOW (ref 135–145)

## 2023-09-06 LAB — CBC
HCT: 31.3 % — ABNORMAL LOW (ref 36.0–46.0)
Hemoglobin: 10.3 g/dL — ABNORMAL LOW (ref 12.0–15.0)
MCH: 29.9 pg (ref 26.0–34.0)
MCHC: 32.9 g/dL (ref 30.0–36.0)
MCV: 90.7 fL (ref 80.0–100.0)
Platelets: 380 K/uL (ref 150–400)
RBC: 3.45 MIL/uL — ABNORMAL LOW (ref 3.87–5.11)
RDW: 12.8 % (ref 11.5–15.5)
WBC: 7.3 K/uL (ref 4.0–10.5)
nRBC: 0 % (ref 0.0–0.2)

## 2023-09-06 NOTE — Plan of Care (Signed)
  Problem: Pain Managment: Goal: General experience of comfort will improve and/or be controlled Outcome: Progressing   Problem: Safety: Goal: Ability to remain free from injury will improve Outcome: Progressing   Problem: Skin Integrity: Goal: Risk for impaired skin integrity will decrease Outcome: Progressing

## 2023-09-06 NOTE — Plan of Care (Signed)
  Problem: Health Behavior/Discharge Planning: Goal: Ability to manage health-related needs will improve Outcome: Progressing   Problem: Clinical Measurements: Goal: Ability to maintain clinical measurements within normal limits will improve Outcome: Progressing Goal: Respiratory complications will improve Outcome: Progressing   Problem: Nutrition: Goal: Adequate nutrition will be maintained Outcome: Progressing   Problem: Coping: Goal: Level of anxiety will decrease Outcome: Progressing

## 2023-09-06 NOTE — Progress Notes (Signed)
 Physical Therapy Treatment Patient Details Name: Diana Cook MRN: [id number] DOB: September 02, 1950 Today's Date: 09/06/2023   History of Present Illness 73 y.o. 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    PT Comments  Pt received supine in bed agreeable to PT with spouse present. Began session with answering questions/concerns from pt and spouse on [facility] vs STR placement. Discussed if pt were to go home, would benefit from manual w/c to assist in distances pt can not walk due to her dizziness (I.e. from car into home, etc.). Focus of session grossly on maximizing independence with transfers in prep for home d/c. Pt remains mod-I with bed mobility and supervision for STS and x2 SPT from bed and chair. Pt initially with worsening dizziness on first STS but with seated rest and second rep pt able to complete safely with RW and maintain standing > 1 min at EOB or by recliner working on standing tolerance. Pt remains symptomatic throughout all OOB mobility but reports improved from prior sessions. Pt left in bed with all needs in reach. D/c recs remain appropriate.   If plan is discharge home, recommend the following: A little help with walking and/or transfers;A little help with bathing/dressing/bathroom;Assist for transportation;Help with stairs or ramp for entrance   Can travel by private vehicle        Equipment Recommendations  Wheelchair (measurements PT);Wheelchair cushion (measurements PT)    Recommendations for Other Services       Precautions / Restrictions Precautions Precautions: Fall Recall of Precautions/Restrictions: Intact Precaution/Restrictions Comments: MONITOR  ORTHOSTATICS Other Brace: abdominal binder and ted hose Restrictions Weight Bearing Restrictions Per Provider Order: No     Mobility  Bed Mobility Overal bed mobility: Modified Independent               Patient Response: Cooperative  Transfers Overall transfer level: Needs assistance Equipment used: Rolling walker (2 wheels) Transfers: Sit to/from Stand, Bed to chair/wheelchair/BSC Sit to Stand: Supervision   Step pivot transfers: Supervision       General transfer comment: x2 SPT at EOB and x2 STS performed all at supervision level. Pt remains with symptomatic orthostatics but able to safely complete transfers with DME.    Ambulation/Gait                   Stairs             Wheelchair Mobility     Tilt Bed Tilt Bed Patient Response: Cooperative  Modified Rankin (Stroke Patients Only)       Balance Overall balance assessment: Needs assistance Sitting-balance support: Feet supported Sitting balance-Leahy Scale: Good     Standing balance support: Single extremity supported Standing balance-Leahy Scale: Fair                              Hotel manager: No apparent difficulties  Cognition Arousal: Alert Behavior During Therapy: WFL for tasks assessed/performed   PT - Cognitive impairments: No apparent impairments                         Following commands: Intact      Cueing Cueing Techniques:  Verbal cues, Tactile cues  Exercises Other Exercises Other Exercises: DME needs for safe transition home    General Comments General comments (skin integrity, edema, etc.): 5 L/min via [place] throughout session      Pertinent Vitals/Pain Pain Assessment Pain Assessment: No/denies pain    Home Living                          Prior Function            PT Goals (current goals can now be found in the care plan section) Acute Rehab PT Goals Patient Stated Goal: get better so I  can go home/beach PT Goal Formulation: With patient Time For Goal Achievement: 09/12/23 Potential to Achieve Goals: Fair Progress towards PT goals: Progressing toward goals    Frequency    Min 3X/week      PT Plan      Co-evaluation              AM-PAC PT 6 Clicks Mobility   Outcome Measure  Help needed turning from your back to your side while in a flat bed without using bedrails?: None Help needed moving from lying on your back to sitting on the side of a flat bed without using bedrails?: None Help needed moving to and from a bed to a chair (including a wheelchair)?: A Little Help needed standing up from a chair using your arms (e.g., wheelchair or bedside chair)?: A Little Help needed to walk in hospital room?: A Little Help needed climbing 3-5 steps with a railing? : A Little 6 Click Score: 20    End of Session Equipment Utilized During Treatment: Oxygen  Activity Tolerance: Patient tolerated treatment well Patient left: in bed;with call bell/phone within reach;with bed alarm set Nurse Communication: Mobility status PT Visit Diagnosis: Other abnormalities of gait and mobility (R26.89);Repeated falls (R29.6);Muscle weakness (generalized) (M62.81);Difficulty in walking, not elsewhere classified (R26.2);Dizziness and giddiness (R42)     Time: 8673-8646 PT Time Calculation (min) (ACUTE ONLY): 27 min  Charges:    $Therapeutic Activity: 8-22 mins $Self Care/Home Management: 8-22 PT General Charges $$ ACUTE PT VISIT: 1 Visit                     Dorina HERO. Fairly IV, PT, DPT Physical Therapist- [facility] 09/06/2023, 3:21 PM

## 2023-09-06 NOTE — Progress Notes (Signed)
 Progress Note   Patient: Diana Cook FMW:[id number] DOB: 1950-03-29 DOA: 08/28/2023     8 DOS: the patient was seen and examined on 09/06/2023   Brief hospital course: JASIA HILTUNEN is a 73 y.o. 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  Patient is having diarrhea for past 1 week, decreased p.o. intake that might be contributing to dehydration and orthostatic hypotension.   ED course: VS Positive orthostatics BMP chloride 97, glucose 116, creatinine 1.02 rest within normal range CBC within normal range   CT head:  small left frontal scalp hematoma but no intracranial abnormalities. CT C-spine with no acute fracture or dislocation.  CTA chest PE study negative for PE.   Pt received IV NS 1 L bolus. TRH was consulted for admission.    Assessment and Plan:  # Orthostatic hypotension most likely due to dehydration secondary to diarrhea and decreased p.o. intake Patient presented with syncope and lightheadedness. Cortisol level within normal range, TSH 0.78 at lower end Continue fall precaution Continue to hold Lasix , Prozac  and benazepril -TTE earlier this month shows normal EF with G1DD but no valvular abnormalities  -Carotid re-stenosis considered however recent U/S of her carotid shows normal flow hemodynamics  Continue to monitor on telemetry 7/7 started midodrine  5 mg p.o. 3 times daily with holding parameters Continue abdominal binder and TED hose while ambulation 7/8 increased midodrine  from 5 to 10 mg p.o. 3 times daily with holding parameters due to persistent orthostatic hypotension and feeling dizzy 7/9 started Florinef  0.1 mg p.o. daily due to  persistent orthostatic hypotension and dizziness 7/11 remains orthostatic. 7/12 increase Florinef  0.1 mg p.o. twice daily    # UTI, UA positive, s/p ceftriaxone , urine culture 10K E. coli, sensitive to cephalosporin and Cipro. (Resistant to ampicillin  and Augmentin ) 7/10 started cefadroxil  500 mg p.o. twice daily for 3 more days   # Hypokalemia, potassium repleted # Hypomagnesemia, mag repleted. Monitor electrolytes and replete as needed.   # COPD # Chronic hypoxic respiratory failure - No respiratory distress, remains on baseline 4 L [place] - Continue home bronchodilator - As needed DuoNebs - Continue supplemental O2   # Chronic diastolic CHF  - TTE LVEF 55 to 60%, G1DD but no valvular abnormalities - Patient dry on exam - Hold as needed Lasix    # Paroxysmal A-fib # Complete heart block s/p PPM - EKG shows atrial-sensed ventricularly-paced sinus rhythm - HR stable in the 70s to 80s - Continue Eliquis    # HLD # CAD/MI # Carotic stenosis s/p  - Continue atorvastatin , aspirin  and Eliquis    # Seizure disorder - Continue Keppra  - Seizure precautions   # GERD - Continue Protonix      Subjective: No significant events overnight, patient was able to work with physical therapy, was able to stand up and moved to the bedside chair.  Patient did feel little bit dizzy but overall feels improvement.   Physical Exam: Vitals:   09/05/23 2036 09/06/23 0531 09/06/23 0715 09/06/23 0807  BP: (!) 109/52 125/61  129/69  Pulse: 73 74  68  Resp: 18 18  18   Temp: 97.9 F (36.6 C) 97.8 F (36.6 C)  97.9 F (36.6 C)  TempSrc:  SpO2: 91% 96% 90% 92%  Weight:      Height:       General: NAD, lying comfortably Appear in no distress, affect appropriate Eyes pale conjunctiva ENT: Oral Mucosa Clear, moist  Neck: no JVD,  Cardiovascular: S1 and S2 Present, no Murmur,  Respiratory: good respiratory effort, Bilateral Air entry equal and Decreased, no Crackles, no wheezes Abdomen:  Bowel Sound present, Soft and no tenderness,  Skin: no rashes Extremities: no Pedal edema, no calf tenderness Neurologic: without any new focal findings Gait not checked due to patient safety concerns   Data Reviewed: Labs reviewed  Family Communication: Plan of care was discussed with patient at the bedside.  She verbalizes understanding and agrees with plan.  Disposition: Status is: Inpatient Remains inpatient appropriate because: Continues to have orthostatic blood pressure changes  Planned Discharge Destination: TBD    Time spent: 40 minutes  Author: Elvan Sor, MD 09/06/2023 2:24 PM  For on call review [email or web address].

## 2023-09-07 DIAGNOSIS — I951 Orthostatic hypotension: Secondary | ICD-10-CM | POA: Diagnosis not present

## 2023-09-07 MED ORDER — ACETAMINOPHEN 325 MG PO TABS
650.0000 mg | ORAL_TABLET | Freq: Four times a day (QID) | ORAL | Status: DC | PRN
  Administered 2023-09-07 – 2023-09-11 (×6): 650 mg via ORAL
  Filled 2023-09-07 (×6): qty 2

## 2023-09-07 MED ORDER — ACETAMINOPHEN 650 MG RE SUPP: 650.0000 mg | Freq: Four times a day (QID) | RECTAL | Status: DC | PRN

## 2023-09-07 NOTE — Progress Notes (Signed)
 Progress Note   Patient: Diana Cook FMW:[id number] DOB: 09-10-1950 DOA: 08/28/2023     9 DOS: the patient was seen and examined on 09/07/2023   Brief hospital course: ALTHA SWEITZER is a 73 y.o. 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  Patient is having diarrhea for past 1 week, decreased p.o. intake that might be contributing to dehydration and orthostatic hypotension.   ED course: VS Positive orthostatics BMP chloride 97, glucose 116, creatinine 1.02 rest within normal range CBC within normal range   CT head:  small left frontal scalp hematoma but no intracranial abnormalities. CT C-spine with no acute fracture or dislocation.  CTA chest PE study negative for PE.   Pt received IV NS 1 L bolus. TRH was consulted for admission.    Assessment and Plan:  # Orthostatic hypotension most likely due to dehydration secondary to diarrhea and decreased p.o. intake Patient presented with syncope and lightheadedness. Cortisol level within normal range, TSH 0.78 at lower end Continue fall precaution Continue to hold Lasix , Prozac  and benazepril -TTE earlier this month shows normal EF with G1DD but no valvular abnormalities  -Carotid re-stenosis considered however recent U/S of her carotid shows normal flow hemodynamics  Continue to monitor on telemetry 7/7 started midodrine  5 mg p.o. 3 times daily with holding parameters Continue abdominal binder and TED hose while ambulation 7/8 increased midodrine  from 5 to 10 mg p.o. 3 times daily with holding parameters due to persistent orthostatic hypotension and feeling dizzy 7/9 started Florinef  0.1 mg p.o. daily due to  persistent orthostatic hypotension and dizziness 7/11 remains orthostatic. 7/12 increase Florinef  0.1 mg p.o. twice daily    # UTI, UA positive, s/p ceftriaxone , urine culture 10K E. coli, sensitive to cephalosporin and Cipro. (Resistant to ampicillin  and Augmentin ) 7/10 started cefadroxil  500 mg p.o. twice daily for 3 more days   # Hypokalemia, potassium repleted # Hypomagnesemia, mag repleted. Monitor electrolytes and replete as needed.   # COPD # Chronic hypoxic respiratory failure - No respiratory distress, remains on baseline 4 L [place] - Continue home bronchodilator - As needed DuoNebs - Continue supplemental O2   # Chronic diastolic CHF  - TTE LVEF 55 to 60%, G1DD but no valvular abnormalities - Patient dry on exam - Hold as needed Lasix    # Paroxysmal A-fib # Complete heart block s/p PPM - EKG shows atrial-sensed ventricularly-paced sinus rhythm - HR stable in the 70s to 80s - Continue Eliquis    # HLD # CAD/MI # Carotic stenosis s/p  - Continue atorvastatin , aspirin  and Eliquis    # Seizure disorder - Continue Keppra  - Seizure precautions   # GERD - Continue Protonix      Subjective: No significant events overnight, patient is working with physical therapy and still symptomatic with standing up.  Patient still has orthostatic hypotension. Overall she feels improvement.   Physical Exam: Vitals:   09/07/23 0720 09/07/23 0848 09/07/23 1417 09/07/23 1546  BP:  (!) 96/54 (!) 88/56 (!) 142/66  Pulse:  78  69  Resp:  18  17  Temp:  97.7 F (36.5 C)  98 F (36.7 C)  TempSrc:      SpO2: 90% 93%  94%  Weight:  Height:       General: NAD, lying comfortably Appear in no distress, affect appropriate Eyes pale conjunctiva ENT: Oral Mucosa Clear, moist  Neck: no JVD,  Cardiovascular: S1 and S2 Present, no Murmur,  Respiratory: good respiratory effort, Bilateral Air entry equal and Decreased, no Crackles, no wheezes Abdomen: Bowel Sound present, Soft and no  tenderness,  Skin: no rashes Extremities: no Pedal edema, no calf tenderness Neurologic: without any new focal findings Gait not checked due to patient safety concerns   Data Reviewed: Labs reviewed  Family Communication: Plan of care was discussed with patient at the bedside.  She verbalizes understanding and agrees with plan.  Disposition: Status is: Inpatient Remains inpatient appropriate because: Continues to have orthostatic blood pressure changes  Planned Discharge Destination: SN[facility] [place] [facility] DC plan to [facility] when bed will be available.     Time spent: 40 minutes  Author: Elvan Sor, MD 09/07/2023 5:07 PM  For on call review [email or web address].

## 2023-09-07 NOTE — Progress Notes (Signed)
 Physical Therapy Treatment Patient Details Name: Diana Cook MRN: [id number] DOB: 05-13-1950 Today's Date: 09/07/2023   History of Present Illness 73 y.o. 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    PT Comments  Pt received in bed, family at bedside. PT session focused on education to reduced fall risk, understanding Orthostatic Hypotension, PLB technique for exertional recovery. Pt received assist to donn TED hose and ABD binder in supine prior to assessing vitals. Supine 168/85  HR 69 Sitting   124/67  HR 72 Standing 83/52  HR 92 +dizziness, visual changes, and SOB 87% on 5L O2 Post seated rest break for several minutes, pt was able to ambulate to/from bathroom with RW. Upon return pt symptomatic, SpO2 86% on 5L, pt reclined with decreased dizziness. Will continue to progress as BP issues stabilize.    If plan is discharge home, recommend the following: A little help with walking and/or transfers;A little help with bathing/dressing/bathroom;Assist for transportation;Help with stairs or ramp for entrance   Can travel by private vehicle        Equipment Recommendations  Wheelchair (measurements PT);Wheelchair cushion (measurements PT)    Recommendations for Other Services       Precautions / Restrictions Precautions Precautions: Fall Recall of Precautions/Restrictions: Intact Precaution/Restrictions Comments: MONITOR ORTHOSTATICS Required Braces or Orthoses: Other Brace Other Brace: abdominal binder and ted hose Restrictions Weight Bearing Restrictions Per Provider Order: No     Mobility  Bed Mobility Overal bed mobility: Modified Independent                   Transfers Overall transfer level: Needs assistance Equipment used: Rolling walker (2 wheels) Transfers: Sit to/from Stand Sit to Stand: Supervision           General transfer comment: Heavy reliance on RW once standing for balance and security    Ambulation/Gait Ambulation/Gait assistance: Contact guard assist Gait Distance (Feet): 20 Feet Assistive device: Rolling walker (2 wheels) Gait Pattern/deviations: Step-through pattern, Decreased step length - right, Decreased step length - left Gait velocity: decreased     General Gait Details: Ambulated to/from bathroom while on 5L O2   Stairs             Wheelchair Mobility     Tilt Bed    Modified Rankin (Stroke Patients Only)       Balance Overall balance assessment: Needs assistance Sitting-balance support: Feet supported Sitting balance-Leahy Scale: Good     Standing balance support: Bilateral upper extremity supported, During functional activity, Reliant on assistive device for balance Standing balance-Leahy Scale: Fair Standing balance comment: Heavy reliance on RW for support this date                            Communication Communication Communication: No apparent difficulties  Cognition Arousal: Alert Behavior During Therapy: WFL for tasks assessed/performed   PT - Cognitive impairments: No apparent impairments                       PT - Cognition Comments: A&Ox4 Following commands: Intact      Cueing Cueing Techniques: Verbal cues, Tactile cues  Exercises  General Comments General comments (skin integrity, edema, etc.):  (Assist to donn TED Hose and Abdominal binder in supine.)      Pertinent Vitals/Pain Pain Assessment Pain Assessment: No/denies pain    Home Living                          Prior Function            PT Goals (current goals can now be found in the care plan section) Acute Rehab PT Goals Patient Stated Goal: get  better so I can go home/beach Progress towards PT goals: Progressing toward goals    Frequency    Min 3X/week      PT Plan      Co-evaluation              AM-PAC PT 6 Clicks Mobility   Outcome Measure  Help needed turning from your back to your side while in a flat bed without using bedrails?: None Help needed moving from lying on your back to sitting on the side of a flat bed without using bedrails?: None Help needed moving to and from a bed to a chair (including a wheelchair)?: A Little Help needed standing up from a chair using your arms (e.g., wheelchair or bedside chair)?: A Little Help needed to walk in hospital room?: A Little Help needed climbing 3-5 steps with a railing? : A Little 6 Click Score: 20    End of Session Equipment Utilized During Treatment: Oxygen  Activity Tolerance: Treatment limited secondary to medical complications (Comment) (Orthostatic Hypotension) Patient left: in chair;with call bell/phone within reach;with chair alarm set;with family/visitor present Nurse Communication: Mobility status;Other (comment) (Ortho Hypo) PT Visit Diagnosis: Other abnormalities of gait and mobility (R26.89);Repeated falls (R29.6);Muscle weakness (generalized) (M62.81);Difficulty in walking, not elsewhere classified (R26.2);Dizziness and giddiness (R42)     Time: 1230-1320 PT Time Calculation (min) (ACUTE ONLY): 50 min  Charges:    $Gait Training: 8-22 mins $Therapeutic Activity: 23-37 mins PT General Charges $$ ACUTE PT VISIT: 1 Visit                    [name], PTA  [name] 09/07/2023, 1:53 PM

## 2023-09-07 NOTE — Progress Notes (Signed)
 Occupational Therapy Treatment Patient Details Name: Diana Cook MRN: [id number] DOB: April 23, 1950 Today's Date: 09/07/2023   History of present illness 73 y.o. 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   OT comments  Diana Cook was seen for OT treatment on this date. Upon arrival to room pt seated in recliner, agreeable to OT Tx session. Denies adverse s/s t/o session. RN in room to assess BP and pt noted with BP of 87/50. OT facilitated seated ADL management as described below to maximize pt safety and tolerance for upright positioning. See ADL section for additional details regarding occupational performance. Pt continues to be functionally limited by decreased activity tolerance in setting of orthostatic hypotension, decreased balance, and generalized weakness. Pt return verbalizes understanding of education provided t/o session. Pt is progressing toward OT goals and continues to benefit from skilled OT services to maximize return to PLOF and minimize risk of future falls, injury, caregiver burden, and readmission. Will continue to follow POC as written. Discharge recommendation remains appropriate.        If plan is discharge home, recommend the following:  A little help with walking and/or transfers;A little help with bathing/dressing/bathroom;Assistance with cooking/housework;Assist for transportation;Help with stairs or ramp for entrance   Equipment Recommendations  Other (comment)    Recommendations for Other Services      Precautions / Restrictions Precautions Precautions: Fall Recall of Precautions/Restrictions: Intact Precaution/Restrictions Comments: MONITOR  ORTHOSTATICS Required Braces or Orthoses: Other Brace Other Brace: abdominal binder and ted hose Restrictions Weight Bearing Restrictions Per Provider Order: No       Mobility Bed Mobility               General bed mobility comments: NT pt in recliner at start/end of session.    Transfers                   General transfer comment: NT 2/2 BP in sitting. eager to remain in recliner, so determied to maximize safety ADL management could be completed in sitting to maximize upright posture and time OOB.     Balance Overall balance assessment: Needs assistance   Sitting balance-Leahy Scale: Normal Sitting balance - Comments: steady sitting, reaching outside BOS                                   ADL either performed or assessed with clinical judgement   ADL Overall ADL's : Needs assistance/impaired     Grooming: Wash/dry face;Set up;Brushing hair;Oral care;Supervision/safety;Sitting Grooming Details (indicate cue type and reason): completed in tall sitting position due to BP in sitting. Pt remains asymptomatic t/o session.                               General ADL Comments: BP/Orthostatic hypotension remain a barrier for standing components of ADL engagement. RN in to check BP at start of OT session and pt noted to have BP of 87/50 while seated upright in room recliner. educated on LB and UB ther-ex to complete in seated position as well as considerations for abdominal binder and ted stocking use.  Extremity/Trunk Assessment Upper Extremity Assessment Upper Extremity Assessment: Generalized weakness   Lower Extremity Assessment Lower Extremity Assessment: Generalized weakness        Vision Patient Visual Report: No change from baseline     Perception     Praxis     Communication Communication Communication: No apparent difficulties   Cognition Arousal: Alert Behavior During Therapy: WFL for tasks  assessed/performed Cognition: No apparent impairments                               Following commands: Intact        Cueing   Cueing Techniques: Verbal cues  Exercises Other Exercises Other Exercises: OT facilitated ADL management with edu and assist as described above. Additional time for vitals check during session.    Shoulder Instructions       General Comments  (Assist to donn TED Hose and Abdominal binder in supine.)    Pertinent Vitals/ Pain       Pain Assessment Pain Assessment: No/denies pain  Home Living                                          Prior Functioning/Environment              Frequency  Min 2X/week        Progress Toward Goals  OT Goals(current goals can now be found in the care plan section)  Progress towards OT goals: Progressing toward goals  Acute Rehab OT Goals Patient Stated Goal: To feel better OT Goal Formulation: With patient Time For Goal Achievement: 09/12/23 Potential to Achieve Goals: Fair  Plan      Co-evaluation                 AM-PAC OT 6 Clicks Daily Activity     Outcome Measure   Help from another person eating meals?: None Help from another person taking care of personal grooming?: A Little Help from another person toileting, which includes using toliet, bedpan, or urinal?: A Little Help from another person bathing (including washing, rinsing, drying)?: A Little Help from another person to put on and taking off regular upper body clothing?: A Little Help from another person to put on and taking off regular lower body clothing?: A Little 6 Click Score: 19    End of Session    OT Visit Diagnosis: Other abnormalities of gait and mobility (R26.89);Repeated falls (R29.6)   Activity Tolerance Treatment limited secondary to medical complications (Comment)   Patient Left in chair;with call bell/phone within reach   Nurse Communication          Time: 8596-8571 OT  Time Calculation (min): 25 min  Charges: OT General Charges $OT Visit: 1 Visit OT Treatments $Self Care/Home Management : 23-37 mins  Jhonny Pelton, M.S., OTR/L 09/07/23, 3:41 PM

## 2023-09-07 NOTE — Plan of Care (Signed)

## 2023-09-08 ENCOUNTER — Telehealth: Payer: Self-pay | Admitting: Internal Medicine

## 2023-09-08 DIAGNOSIS — I951 Orthostatic hypotension: Secondary | ICD-10-CM | POA: Diagnosis not present

## 2023-09-08 MED ORDER — POLYETHYLENE GLYCOL 3350 17 G PO PACK
17.0000 g | PACK | Freq: Every day | ORAL | Status: DC | PRN
  Administered 2023-09-10 – 2023-09-11 (×2): 17 g via ORAL
  Filled 2023-09-08 (×2): qty 1

## 2023-09-08 MED ORDER — SODIUM CHLORIDE 0.9 % IV SOLN: INTRAVENOUS | Status: AC

## 2023-09-08 MED ORDER — BISACODYL 5 MG PO TBEC: 10.0000 mg | DELAYED_RELEASE_TABLET | Freq: Every day | ORAL | Status: DC | PRN

## 2023-09-08 NOTE — TOC Progression Note (Signed)
 Transition of Care [facility]) - Progression Note    Patient Details  Name: Diana Cook MRN: [id number] Date of Birth: 1950/05/28  Transition [facility] CM/SW Contact  Dalia GORMAN Fuse, RN Phone Number: 09/08/2023, 4:03 PM  Clinical Narrative:     TOC outreached to Eveleen [phone] with [employer] to see if they can accept the patient's payor for [facility] PT/OT/  He is going to check with the business off and will reach back out to TOC.  TOC received a callback, [employer] is not in network with the patient's plan.  Expected Discharge Plan: Home w Home Health Services Barriers to Discharge: Continued Medical Work up  Expected Discharge Plan and Services       Living arrangements for the past 2 months: Single Family Home                                       Social Determinants of Health (SDOH) Interventions SDOH Screenings   Food Insecurity: No Food Insecurity (08/28/2023)  Housing: Low Risk  (08/28/2023)  Transportation Needs: No Transportation Needs (08/28/2023)  Utilities: Not At Risk (08/28/2023)  Alcohol Screen: Low Risk  (05/13/2023)  Depression (PHQ2-9): Low Risk  (05/08/2023)  Recent Concern: Depression (PHQ2-9) - Medium Risk (03/25/2023)  Financial Resource Strain: Low Risk  (05/13/2023)  Physical Activity: Inactive (05/13/2023)  Social Connections: Socially Integrated (08/28/2023)  Stress: No Stress Concern Present (05/13/2023)  Tobacco Use: Medium Risk (08/28/2023)  Health Literacy: Adequate Health Literacy (03/06/2023)    Readmission Risk Interventions     No data to display

## 2023-09-08 NOTE — Plan of Care (Signed)
  Problem: Clinical Measurements: Goal: Will remain free from infection 09/08/2023 0112 by Melven Odilia PARAS, RN Outcome: Progressing 09/08/2023 0111 by Melven Odilia PARAS, RN Outcome: Not Progressing Goal: Diagnostic test results will improve 09/08/2023 0112 by Melven Odilia PARAS, RN Outcome: Progressing 09/08/2023 0111 by Melven Odilia PARAS, RN Outcome: Not Progressing Goal: Respiratory complications will improve 09/08/2023 0112 by Melven Odilia PARAS, RN Outcome: Progressing 09/08/2023 0111 by Melven Odilia PARAS, RN Outcome: Not Progressing Goal: Cardiovascular complication will be avoided 09/08/2023 0112 by Melven Odilia PARAS, RN Outcome: Progressing 09/08/2023 0111 by Melven Odilia PARAS, RN Outcome: Not Progressing   Problem: Activity: Goal: Risk for activity intolerance will decrease 09/08/2023 0112 by Melven Odilia PARAS, RN Outcome: Progressing 09/08/2023 0111 by Melven Odilia PARAS, RN Outcome: Not Progressing   Problem: Nutrition: Goal: Adequate nutrition will be maintained 09/08/2023 0112 by Melven Odilia PARAS, RN Outcome: Progressing 09/08/2023 0111 by Melven Odilia PARAS, RN Outcome: Not Progressing   Problem: Coping: Goal: Level of anxiety will decrease 09/08/2023 0112 by Melven Odilia PARAS, RN Outcome: Progressing 09/08/2023 0111 by Melven Odilia PARAS, RN Outcome: Not Progressing   Problem: Elimination: Goal: Will not experience complications related to bowel motility 09/08/2023 0112 by Melven Odilia PARAS, RN Outcome: Progressing 09/08/2023 0111 by Melven Odilia PARAS, RN Outcome: Not Progressing Goal: Will not experience complications related to urinary retention 09/08/2023 0112 by Melven Odilia PARAS, RN Outcome: Progressing 09/08/2023 0111 by Melven Odilia PARAS, RN Outcome: Not Progressing   Problem: Pain Managment: Goal: General experience of comfort will improve and/or be controlled 09/08/2023 0112 by Melven Odilia PARAS, RN Outcome: Progressing 09/08/2023 0111 by Melven Odilia PARAS, RN Outcome: Not  Progressing   Problem: Safety: Goal: Ability to remain free from injury will improve 09/08/2023 0112 by Melven Odilia PARAS, RN Outcome: Progressing 09/08/2023 0111 by Melven Odilia PARAS, RN Outcome: Not Progressing   Problem: Skin Integrity: Goal: Risk for impaired skin integrity will decrease 09/08/2023 0112 by Melven Odilia PARAS, RN Outcome: Progressing 09/08/2023 0111 by Melven Odilia PARAS, RN Outcome: Not Progressing   Problem: Clinical Measurements: Goal: Ability to maintain clinical measurements within normal limits will improve 09/08/2023 0112 by Negar Sieler J, RN Outcome: Not Progressing 09/08/2023 0111 by Melven Odilia PARAS, RN Outcome: Not Progressing

## 2023-09-08 NOTE — Progress Notes (Signed)
 PROGRESS NOTE    GWENN TEODORO  FMW:[id number] DOB: 09/30/1950 DOA: 08/28/2023 PCP: Randeen Laine LABOR, MD  117A/117A-AA  LOS: 10 days   Brief hospital course:   Assessment & Plan: DONNETTA GILLIN is a 73 y.o. 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  Patient is having diarrhea for past 1 week, decreased p.o. intake that might be contributing to dehydration and orthostatic hypotension.    # Orthostatic hypotension  most likely due to dehydration secondary to diarrhea and decreased p.o. intake Patient presented with syncope and lightheadedness. Cortisol level within normal range, TSH 0.78 at lower end Continue fall precaution Continue to hold Lasix , Prozac  and benazepril -TTE earlier this month shows normal EF with G1DD but no valvular abnormalities  -Carotid re-stenosis considered however recent U/S of her carotid shows normal flow hemodynamics  Continue to monitor on telemetry 7/7 started midodrine  5 mg p.o. 3 times daily with holding parameters Continue abdominal binder and TED hose while ambulation 7/8 increased midodrine  from 5 to 10 mg p.o. 3 times daily with holding parameters due to persistent orthostatic hypotension and feeling dizzy 7/9 started Florinef  0.1 mg p.o. daily due to persistent orthostatic hypotension and dizziness 7/11 remains orthostatic. 7/12 increase Florinef  0.1 mg p.o. twice daily --start MIVF     # UTI, UA positive, s/p ceftriaxone , urine culture 10K E. coli, sensitive to cephalosporin and Cipro. (Resistant to ampicillin  and Augmentin ) 7/10 started cefadroxil  500 mg p.o. twice daily for 3 more days   # Hypokalemia, #  Hypomagnesemia, --monitor and supplement PRN   # COPD # Chronic hypoxic respiratory failure on 4L O[place] - No respiratory distress, remains on baseline 4 L [place] --cont home bronchodilators  # Chronic diastolic CHF  - TTE LVEF 55 to 60%, G1DD but no valvular abnormalities - Patient dry on exam - Hold as needed Lasix    # Paroxysmal A-fib # Complete heart block s/p PPM - EKG shows atrial-sensed ventricularly-paced sinus rhythm - HR stable in the 70s to 80s - Continue Eliquis    # HLD # CAD/MI # Carotic stenosis s/p  --cont ASA and statin   # Seizure disorder - Continue Keppra  - Seizure precautions   # GERD - Continue Protonix   Hyponatremia --mild --monitor   DVT prophylaxis: On:Eliquis  Code Status: Full code  Family Communication:  Level of care: Telemetry Medical Dispo:   The patient is from: home Anticipated d/c is to: SNF rehab Anticipated d/c date is: 1-2 days   Subjective and Interval History:  Still had significant BP drop with standing.  No dyspnea at rest.   Objective: Vitals:   09/08/23 0829 09/08/23 1608 09/08/23 1949 09/08/23 2034  BP: (!) 105/53 122/66  (!) 158/75  Pulse: 80 84  77  Resp: 18 18  18   Temp:  97.9 F (36.6 C)  98 F (36.7 C)  TempSrc:    Oral  SpO2: (!) 87% 92% 92% 92%  Weight:      Height:        Intake/Output Summary (Last 24 hours) at 09/08/2023 2218 Last data filed at 09/08/2023 1918 Gross per 24 hour  Intake 480 ml  Output --  Net 480 ml   Filed Weights   08/28/23 1334  Weight: 90.7 kg    Examination:   Constitutional: NAD, AAOx3 HEENT: conjunctivae and lids normal, EOMI CV: No cyanosis.   RESP: normal respiratory effort, on RA Extremities: TED hose on BLE SKIN: warm, dry Neuro: II - XII grossly intact.   Psych: Normal mood and affect.  Appropriate judgement and reason   Data Reviewed: I have personally reviewed labs and imaging studies  Time spent: 50 minutes  Ellouise Haber, MD [facility] If 7PM-7AM,  please contact night-coverage 09/08/2023, 10:18 PM

## 2023-09-08 NOTE — Telephone Encounter (Signed)
 Called device clinic, but the phone rang for 3 minutes and never went to voicemail.  Patient says a nurse called to inform her that 7/10  transmission wasn't received. She called back to inform the team that she is currently admitted and unable to send in transmission. Will send in transmission when she returns home--FYI.

## 2023-09-09 ENCOUNTER — Ambulatory Visit: Attending: Radiation Oncology | Admitting: Radiation Oncology

## 2023-09-09 DIAGNOSIS — I951 Orthostatic hypotension: Secondary | ICD-10-CM | POA: Diagnosis not present

## 2023-09-09 MED ORDER — SODIUM CHLORIDE 0.9 % IV SOLN: INTRAVENOUS | Status: AC

## 2023-09-09 NOTE — Progress Notes (Signed)
 PROGRESS NOTE    Diana Cook  FMW:[id number] DOB: [birth year] DOA: 08/28/2023 PCP: Randeen Laine LABOR, MD  117A/117A-AA  LOS: 11 days   Brief hospital course:   Assessment & Plan: BAMBI FEHNEL is a 73 y.o. 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  Patient is having diarrhea for past 1 week, decreased p.o. intake that might be contributing to dehydration and orthostatic hypotension.    # Orthostatic hypotension  most likely due to dehydration secondary to diarrhea and decreased p.o. intake Patient presented with syncope and lightheadedness. Cortisol level within normal range, TSH 0.78 at lower end Continue fall precaution Continue to hold Lasix , Prozac  and benazepril -TTE earlier this month shows normal EF with G1DD but no valvular abnormalities  -Carotid re-stenosis considered however recent U/S of her carotid shows normal flow hemodynamics  Continue to monitor on telemetry 7/7 started midodrine  5 mg p.o. 3 times daily with holding parameters Continue abdominal binder and TED hose while ambulation 7/8 increased midodrine  from 5 to 10 mg p.o. 3 times daily with holding parameters due to persistent orthostatic hypotension and feeling dizzy 7/9 started Florinef  0.1 mg p.o. daily due to persistent orthostatic hypotension and dizziness 7/11 remains orthostatic. 7/12 increase Florinef  0.1 mg p.o. twice daily --cont MIVF --daily orthostatic BP measurement     # UTI, UA positive, s/p ceftriaxone , urine culture 10K E. coli, sensitive to cephalosporin and Cipro. (Resistant to ampicillin  and Augmentin ) 7/10 started cefadroxil  500 mg p.o. twice daily for 3 more days    # Hypokalemia, # Hypomagnesemia, --monitor and supplement PRN   # COPD # Chronic hypoxic respiratory failure on 4L O2 - No respiratory distress, remains on baseline 4 L [place] --cont home bronchodilators   # Chronic diastolic CHF  - TTE LVEF 55 to 60%, G1DD but no valvular abnormalities - Patient dry on exam - Hold as needed Lasix    # Paroxysmal A-fib # Complete heart block s/p PPM - EKG shows atrial-sensed ventricularly-paced sinus rhythm - HR stable in the 70s to 80s --cont Eliquis    # HLD # CAD/MI # Carotic stenosis s/p  --cont ASA and statin   # Seizure disorder - Continue Keppra  - Seizure precautions   # GERD - Continue Protonix   Hyponatremia --mild --monitor   DVT prophylaxis: On:Eliquis  Code Status: Full code  Family Communication:  Level of care: Telemetry Medical Dispo:   The patient is from: home Anticipated d/c is to: home Anticipated d/c date is: 1-2 days   Subjective and Interval History:  BP still dropped with standing, but pt able to stand and walk to bedside commode without symptoms.   Objective: Vitals:   09/08/23 2034 09/09/23 0444 09/09/23 0719 09/09/23 0851  BP: (!) 158/75 (!) 137/56  129/61  Pulse: 77 73  72  Resp: 18 18  16   Temp: 98 F (36.7 C) 97.8 F (36.6 C)  97.7 F (36.5 C)  TempSrc: Oral Oral    SpO2: 92% 93% 92% 92%  Weight:      Height:        Intake/Output Summary (Last 24 hours) at 09/09/2023 1923 Last data filed at 09/09/2023 1900 Gross per 24 hour  Intake 1640.18 ml  Output --  Net 1640.18  ml   Filed Weights   08/28/23 1334  Weight: 90.7 kg    Examination:   Constitutional: NAD, AAOx3 HEENT: conjunctivae and lids normal, EOMI CV: No cyanosis.   RESP: normal respiratory effort, on RA Neuro: II - XII grossly intact.   Psych: Normal mood and affect.  Appropriate judgement and reason   Data Reviewed: I have personally reviewed labs and imaging studies  Time spent: 35 minutes  Ellouise Haber, MD [facility] If 7PM-7AM, please contact night-coverage 09/09/2023, 7:23 PM

## 2023-09-09 NOTE — Plan of Care (Signed)
  Problem: Education: Goal: Knowledge of General Education information will improve Description: Including pain rating scale, medication(s)/side effects and non-pharmacologic comfort measures Outcome: Progressing   Problem: Health Behavior/Discharge Planning: Goal: Ability to manage health-related needs will improve Outcome: Progressing   Problem: Clinical Measurements: Goal: Ability to maintain clinical measurements within normal limits will improve 09/09/2023 0430 by Melven Odilia PARAS, RN Outcome: Progressing 09/09/2023 0430 by Melven Odilia PARAS, RN Outcome: Progressing Goal: Will remain free from infection 09/09/2023 0430 by Melven Odilia PARAS, RN Outcome: Progressing 09/09/2023 0430 by Melven Odilia PARAS, RN Outcome: Progressing Goal: Diagnostic test results will improve 09/09/2023 0430 by Melven Odilia PARAS, RN Outcome: Progressing 09/09/2023 0430 by Melven Odilia PARAS, RN Outcome: Progressing Goal: Respiratory complications will improve 09/09/2023 0430 by Melven Odilia PARAS, RN Outcome: Progressing 09/09/2023 0430 by Melven Odilia PARAS, RN Outcome: Progressing Goal: Cardiovascular complication will be avoided 09/09/2023 0430 by Melven Odilia PARAS, RN Outcome: Progressing 09/09/2023 0430 by Melven Odilia PARAS, RN Outcome: Progressing   Problem: Activity: Goal: Risk for activity intolerance will decrease 09/09/2023 0430 by Melven Odilia PARAS, RN Outcome: Progressing 09/09/2023 0430 by Melven Odilia PARAS, RN Outcome: Progressing   Problem: Nutrition: Goal: Adequate nutrition will be maintained 09/09/2023 0430 by Melven Odilia PARAS, RN Outcome: Progressing 09/09/2023 0430 by Melven Odilia PARAS, RN Outcome: Progressing   Problem: Coping: Goal: Level of anxiety will decrease 09/09/2023 0430 by Melven Odilia PARAS, RN Outcome: Progressing 09/09/2023 0430 by Melven Odilia PARAS, RN Outcome: Progressing   Problem: Elimination: Goal: Will not experience complications related to bowel motility 09/09/2023 0430  by Melven Odilia PARAS, RN Outcome: Progressing 09/09/2023 0430 by Melven Odilia PARAS, RN Outcome: Progressing Goal: Will not experience complications related to urinary retention 09/09/2023 0430 by Melven Odilia PARAS, RN Outcome: Progressing 09/09/2023 0430 by Melven Odilia PARAS, RN Outcome: Progressing   Problem: Pain Managment: Goal: General experience of comfort will improve and/or be controlled 09/09/2023 0430 by Melven Odilia PARAS, RN Outcome: Progressing 09/09/2023 0430 by Melven Odilia PARAS, RN Outcome: Progressing   Problem: Safety: Goal: Ability to remain free from injury will improve 09/09/2023 0430 by Melven Odilia PARAS, RN Outcome: Progressing 09/09/2023 0430 by Melven Odilia PARAS, RN Outcome: Progressing   Problem: Skin Integrity: Goal: Risk for impaired skin integrity will decrease 09/09/2023 0430 by Melven Odilia PARAS, RN Outcome: Progressing 09/09/2023 0430 by Melven Odilia PARAS, RN Outcome: Progressing

## 2023-09-09 NOTE — Progress Notes (Signed)
 Occupational Therapy Treatment Patient Details Name: Diana Cook MRN: [id number] DOB: [birth year] Today's Date: 09/09/2023   History of present illness 73 y.o. 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   OT comments  Pt seen for OT treatment on this date. Upon arrival to room pt supine in bed, agreeable to tx. Prior to session, therapist consulted with patient's RN who reported ongoing BP fluctuations with patient currently supine.  Today's OT session to focus on demonstrating recall and carryover of orthostatic hypotension exercises in preparation for ADLs with staff.  Education provided on orthostatic hypotension exercises (including ankle pumps and isometric exercises to LEs) in preparation for ADL task engagement with staff with cuing for technique to increase proficiency.  All completed while supine in bed with HOB elevated due to ongoing OH.   Will provide visual aid at next session.        If plan is discharge home, recommend the following:  A little help with walking and/or transfers;A little help with bathing/dressing/bathroom;Assistance with cooking/housework;Assist for transportation;Help with stairs or ramp for entrance   Equipment Recommendations       Recommendations for Other Services      Precautions / Restrictions Precautions Precautions: Fall Precaution/Restrictions Comments: MONITOR ORTHOSTATICS Required Braces or Orthoses: Other Brace Other Brace: abdominal binder and ted hose       Mobility Bed Mobility                    Transfers                         Balance                                            ADL either performed or assessed with clinical judgement   ADL                                         General ADL Comments: Education on orthostatic symptom management during    Extremity/Trunk Assessment Upper Extremity Assessment Upper Extremity Assessment: Generalized weakness            Vision       Perception     Praxis     Communication Communication Communication: No apparent difficulties   Cognition Arousal: Alert Behavior During Therapy: WFL for tasks assessed/performed Cognition: No apparent impairments                               Following commands: Intact        Cueing   Cueing Techniques: Verbal cues  Exercises Other Exercises Other Exercises: Education provided on orthostatic hypotension exercises (including ankle pumps and isometric exercises to LEs) in preparation for ADL task engagement with staff.    Shoulder Instructions       General Comments      Pertinent Vitals/ Pain       Pain Assessment Pain Assessment: No/denies pain  Home Living                                          Prior Functioning/Environment              Frequency  Min 2X/week        Progress Toward Goals  OT Goals(current goals can now be found in the care plan section)  Progress towards OT goals: Progressing toward goals     Plan      Co-evaluation                 AM-PAC OT 6 Clicks Daily Activity     Outcome Measure                    End of Session    OT Visit Diagnosis: Other abnormalities of gait and mobility (R26.89);Repeated falls (R29.6)   Activity Tolerance     Patient Left     Nurse Communication          Time: 8683-8661 OT Time Calculation (min): 22 min  Charges: OT General Charges $OT Visit: 1 Visit  Harlene Sharps OTR/L   Harlene LITTIE Sharps 09/09/2023, 2:35 PM

## 2023-09-09 NOTE — TOC Progression Note (Signed)
 Transition of Care [facility]) - Progression Note    Patient Details  Name: Diana Cook MRN: [id number] Date of Birth: [birth year]  Transition of [facility]) CM/SW Contact  Dalia GORMAN Fuse, RN Phone Number: 09/09/2023, 9:51 AM  Clinical Narrative:    [facility] sent referral for [facility] PT/OT to [place], [place]  [phone] advised she doesn't see any issues as long as there is no Nursing needed. TOC outreached to [place] with [employer] and requested High strength, light weight manual wheelchair with seat cushion. [employer] spoke with the patient and she would like the [facility] delivered to her home. She will call [employer] when she is ready for the chair.  TOC will continue to follow   Expected Discharge Plan: Home w Home Health Services Barriers to Discharge: Continued Medical Work up  Expected Discharge Plan and Services       Living arrangements for the past 2 months: Single Family Home                                       Social Determinants of Health (SDOH) Interventions SDOH Screenings   Food Insecurity: No Food Insecurity (08/28/2023)  Housing: Low Risk  (08/28/2023)  Transportation Needs: No Transportation Needs (08/28/2023)  Utilities: Not At Risk (08/28/2023)  Alcohol Screen: Low Risk  (05/13/2023)  Depression (PHQ2-9): Low Risk  (05/08/2023)  Recent Concern: Depression (PHQ2-9) - Medium Risk (03/25/2023)  Financial Resource Strain: Low Risk  (05/13/2023)  Physical Activity: Inactive (05/13/2023)  Social Connections: Socially Integrated (08/28/2023)  Stress: No Stress Concern Present (05/13/2023)  Tobacco Use: Medium Risk (08/28/2023)  Health Literacy: Adequate Health Literacy (03/06/2023)    Readmission Risk Interventions     No data to display

## 2023-09-09 NOTE — Plan of Care (Signed)
  Problem: Education: Goal: Knowledge of General Education information will improve Description: Including pain rating scale, medication(s)/side effects and non-pharmacologic comfort measures Outcome: Progressing   Problem: Health Behavior/Discharge Planning: Goal: Ability to manage health-related needs will improve Outcome: Progressing   Problem: Clinical Measurements: Goal: Ability to maintain clinical measurements within normal limits will improve Outcome: Progressing Goal: Will remain free from infection Outcome: Progressing Goal: Diagnostic test results will improve Outcome: Progressing Goal: Respiratory complications will improve Outcome: Progressing Goal: Cardiovascular complication will be avoided Outcome: Progressing   Problem: Nutrition: Goal: Adequate nutrition will be maintained Outcome: Progressing   Problem: Coping: Goal: Level of anxiety will decrease Outcome: Progressing   Problem: Elimination: Goal: Will not experience complications related to bowel motility Outcome: Progressing Goal: Will not experience complications related to urinary retention Outcome: Progressing   Problem: Pain Managment: Goal: General experience of comfort will improve and/or be controlled Outcome: Progressing   Problem: Skin Integrity: Goal: Risk for impaired skin integrity will decrease Outcome: Progressing

## 2023-09-09 NOTE — Progress Notes (Signed)
 Physical Therapy Treatment Patient Details Name: Diana Cook MRN: [id number] DOB: 09-Nov-1950 Today's Date: 09/09/2023   History of Present Illness 73 y.o. 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    PT Comments  Pt pleasant and motivated t/o session.  BP in supine on arrival was good, she did have orthostatic drops but only reported mild lightheadedness symptoms.  She was able to maintain initial standing bout ~8 minutes w/o abdominal binder (did so some heels raises, heavy walker use, chair follow).  We donned binder for ambulation, pt was able to do 185 ft with relatively safe and consistent cadence (using walker, 4L O2). She still had low BP post ambulation, only minimal c/o ligheadedness w/o syncope.  Pt very pleased that she was able to do a prolonged bout of ambulation, still having BP issues but symptomatically improved today. Pt will benefit from continued PT services.    If plan is discharge home, recommend the following: A little help with walking and/or transfers;A little help with bathing/dressing/bathroom;Assist for transportation;Help with stairs or ramp for entrance   Can travel by private vehicle        Equipment Recommendations   (TBD)    Recommendations for Other Services       Precautions / Restrictions Precautions Precautions: Fall Precaution/Restrictions Comments: MONITOR ORTHOSTATICS Other Brace: abdominal binder and ted hose Restrictions Weight Bearing Restrictions Per Provider Order: No     Mobility  Bed Mobility Overal bed mobility: Modified Independent             General bed mobility comments: supine to sit w/o hesitation, minimal  cuing no assist    Transfers Overall transfer level: Needs assistance Equipment used: Rolling walker (2 wheels) Transfers: Sit to/from Stand Sit to Stand: Supervision           General transfer comment: Multiple sit to stand efforts t/o session, with appropriate UE use able to rise w/o assist.  Did have some mild dizziness in standing but generally feeling better than she has with activity    Ambulation/Gait Ambulation/Gait assistance: Contact guard assist Gait Distance (Feet): 185 Feet Assistive device: Rolling walker (2 wheels)         General Gait Details: on 4L (SpO2 down to 88% post ambulation) with FWW.  Pt was able to maintain a relatively consistent and confident cadence with increasing reliant on the walker but no overt dizziness/syncopal symptoms.  She did endose some mild lightheadedness but did not feel the need to sit during the loop.   Stairs             Wheelchair Mobility     Tilt Bed    Modified Rankin (Stroke Patients Only)       Balance Overall balance assessment: Needs assistance Sitting-balance support: Feet supported Sitting balance-Leahy Scale: Normal       Standing balance-Leahy Scale: Good Standing balance comment: clearly want/needing to lean on RW, lightheadedness more of an issue than balance with prolonged standing                            Communication Communication Communication: No apparent difficulties  Cognition Arousal: Alert Behavior During Therapy: [facility] for tasks assessed/performed  PT - Cognitive impairments: No apparent impairments                         Following commands: Intact      Cueing Cueing Techniques: Verbal cues  Exercises      General Comments General comments (skin integrity, edema, etc.): BP on arrival in supine 141/75, sitting 137/55, standing 94/53, standing after 3-5 minutes 76/41.  Pt reports only mild lightheadedness, did request to sit after ~8 minutes of initial  standing BP was 90/52 post ambulation      Pertinent Vitals/Pain Pain Assessment Pain Assessment: No/denies pain    Home Living                          Prior Function            PT Goals (current goals can now be found in the care plan section) Progress towards PT goals: Progressing toward goals    Frequency    Min 2X/week      PT Plan      Co-evaluation              AM-PAC PT 6 Clicks Mobility   Outcome Measure  Help needed turning from your back to your side while in a flat bed without using bedrails?: None Help needed moving from lying on your back to sitting on the side of a flat bed without using bedrails?: None Help needed moving to and from a bed to a chair (including a wheelchair)?: A Little Help needed standing up from a chair using your arms (e.g., wheelchair or bedside chair)?: A Little Help needed to walk in hospital room?: A Little Help needed climbing 3-5 steps with a railing? : A Little 6 Click Score: 20    End of Session Equipment Utilized During Treatment: Oxygen  Activity Tolerance: Patient tolerated treatment well;Treatment limited secondary to medical complications (Comment) (BPs) Patient left: in chair;with call bell/phone within reach;with chair alarm set;with family/visitor present Nurse Communication: Mobility status;Other (comment) PT Visit Diagnosis: Other abnormalities of gait and mobility (R26.89);Repeated falls (R29.6);Muscle weakness (generalized) (M62.81);Difficulty in walking, not elsewhere classified (R26.2);Dizziness and giddiness (R42)     Time: 8395-8367 PT Time Calculation (min) (ACUTE ONLY): 28 min  Charges:    $Gait Training: 8-22 mins $Therapeutic Activity: 8-22 mins PT General Charges $$ ACUTE PT VISIT: 1 Visit                     Carmin JONELLE Deed, DPT 09/09/2023, 5:26 PM

## 2023-09-09 NOTE — Progress Notes (Addendum)
   09/09/23 1200  Spiritual Encounters  Type of Visit Initial  Care provided to: Patient  Reason for visit Routine spiritual support  Spiritual Framework  Presenting Themes Meaning/purpose/sources of inspiration  Community/Connection Significant other;Faith community  Interventions  Spiritual Care Interventions Made Established relationship of care and support;Compassionate presence  Intervention Outcomes  Outcomes Connection to spiritual care   Chaplain encouraged the patient and provided compassionate presence. Patient was concerned that medical team was having a hard time figuring out what is going on with their health.

## 2023-09-10 DIAGNOSIS — I951 Orthostatic hypotension: Secondary | ICD-10-CM | POA: Diagnosis not present

## 2023-09-10 MED ORDER — SODIUM CHLORIDE 0.9 % IV SOLN: INTRAVENOUS | Status: AC

## 2023-09-10 NOTE — Progress Notes (Signed)
 PROGRESS NOTE    Diana Cook  FMW:[id number] DOB: 11-Nov-1950 DOA: 08/28/2023 PCP: Randeen Laine LABOR, MD  [id number]  LOS: 12 days   Brief hospital course:   Assessment & Plan: Diana Cook is a 73 y.o. 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  Patient is having diarrhea for past 1 week, decreased p.o. intake that might be contributing to dehydration and orthostatic hypotension.    # Orthostatic hypotension  most likely due to dehydration secondary to diarrhea and decreased p.o. intake Patient presented with syncope and lightheadedness. Cortisol level within normal range, TSH 0.78 at lower end Continue fall precaution Continue to hold Lasix , Prozac  and benazepril -TTE earlier this month shows normal EF with G1DD but no valvular abnormalities  -Carotid re-stenosis considered however recent U/S of her carotid shows normal flow hemodynamics  Continue to monitor on telemetry 7/7 started midodrine  5 mg p.o. 3 times daily with holding parameters Continue abdominal binder and TED hose while ambulation 7/8 increased midodrine  from 5 to 10 mg p.o. 3 times daily with holding parameters due to persistent orthostatic hypotension and feeling dizzy 7/9 started Florinef  0.1 mg p.o. daily due to persistent orthostatic hypotension and dizziness 7/11 remains orthostatic. 7/12 increase Florinef  0.1 mg p.o. twice daily --cont MIVF --daily orthostatic BP measurement --d/c Florinef  --cont midodrine      # UTI, UA positive, s/p ceftriaxone , urine culture 10K E. coli, sensitive to cephalosporin and Cipro. (Resistant to ampicillin  and Augmentin ) 7/10 started cefadroxil  500 mg  p.o. twice daily for 3 more days   # Hypokalemia, # Hypomagnesemia, --supplement PRN   # COPD # Chronic hypoxic respiratory failure on 4L O2 - No respiratory distress, remains on baseline 4 L [place] --cont home bronchodilators   # Chronic diastolic CHF  - TTE LVEF 55 to 60%, G1DD but no valvular abnormalities - Patient dry on exam - Hold as needed Lasix    # Paroxysmal A-fib # Complete heart block s/p PPM - EKG shows atrial-sensed ventricularly-paced sinus rhythm - HR stable in the 70s to 80s --cont Eliquis    # HLD # CAD/MI # Carotic stenosis s/p  --cont ASA and statin   # Seizure disorder - Continue Keppra  - Seizure precautions   # GERD - Continue Protonix   Hyponatremia --mild --monitor   DVT prophylaxis: On:Eliquis  Code Status: Full code  Family Communication:  Level of care: Telemetry Medical Dispo:   The patient is from: home Anticipated d/c is to: home Anticipated d/c date is: tomorrow   Subjective and Interval History:  Overall BP increased, so orthostatic BP didn't drop <90.  Pt reported less symptom and better tolerance in being up.   Objective: Vitals:   09/09/23 2055 09/10/23 0421 09/10/23 0757 09/10/23 1600  BP:  125/61 (!) 146/63 130/70  Pulse:  72 71 88  Resp:  18 16 18   Temp:  98.2 F (36.8 C) 98 F (36.7 C) 98 F (36.7 C)  TempSrc:    Oral  SpO2: 94% 94% 97% 96%  Weight:      Height:        Intake/Output Summary (Last 24 hours) at 09/10/2023 1951 Last data filed at 09/10/2023 0947 Gross per 24 hour  Intake 480 ml  Output --  Net 480 ml   Filed Weights   08/28/23 1334  Weight: 90.7 kg    Examination:   Constitutional: NAD, AAOx3 HEENT: conjunctivae and lids normal, EOMI CV: No cyanosis.   RESP: normal respiratory effort, on RA Neuro: II - XII grossly intact.   Psych: Normal mood and affect.  Appropriate judgement and reason   Data Reviewed: I have personally reviewed labs and imaging studies  Time spent: 35  minutes  Ellouise Haber, MD [facility] If 7PM-7AM, please contact night-coverage 09/10/2023, 7:51 PM

## 2023-09-10 NOTE — Telephone Encounter (Signed)
 LMOVM letting pt know we got her message.

## 2023-09-10 NOTE — Progress Notes (Signed)
 Occupational Therapy Treatment Patient Details Name: Diana Cook MRN: [id number] DOB: [birth year] Today's Date: 09/10/2023   History of present illness 73 y.o. 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   OT comments  Pt seen for OT treatment on this date. Upon arrival to room pt seated BSC/3:1, agreeable to tx. Focus of today's session was to implement orthostatic hypotension exercises during functional tasks.  While seated on BSC, education on ankle pumps - BP: 124/65, HR: 75.  After standing for +3 minutes for toileting hygiene and transition to sitting EOB, BP - 103/56, HR: 81.  Education and handout for visual aid provided to patient on orthostatic hypotension (ankle pumps and gluteal squeezes) with patient reporting that lightheadedness symptoms reduced and BP: 118/66, HR: 76.  Patient requested to return to bed, bed mobility completed with SBA.   Pt making progress toward goals, will continue to follow POC. Discharge recommendation remains appropriate.        If plan is discharge home, recommend the following:      Equipment Recommendations       Recommendations for Other Services      Precautions / Restrictions Precautions Precautions: Fall Precaution/Restrictions Comments: MONITOR ORTHOSTATICS       Mobility Bed Mobility Overal bed mobility: Modified Independent             General bed mobility comments: supine to sit w/o hesitation, minimal cuing no assist    Transfers Overall transfer level: Needs assistance   Transfers: Sit to/from Stand Sit to Stand: Contact guard assist           General transfer comment: orthostatic hypotension cuing in  preparation for standing during toileting     Balance Overall balance assessment: Needs assistance Sitting-balance support: Feet supported Sitting balance-Leahy Scale: Normal Sitting balance - Comments: steady sitting, reaching outside BOS   Standing balance support: Bilateral upper extremity supported, During functional activity, Reliant on assistive device for balance Standing balance-Leahy Scale: Fair                             ADL either performed or assessed with clinical judgement   ADL Overall ADL's : Needs assistance/impaired                         Toilet Transfer: Contact guard Engineer, manufacturing systems Details (indicate cue type and reason): verbal cuing for orthostatic hypotension exercises and safety with hand placement Toileting- Clothing Manipulation and Hygiene: Contact guard assist;Sit to/from stand         General ADL Comments: Education on orthostatic symptom management during toileting tasks/transfers    Extremity/Trunk Assessment Upper Extremity Assessment Upper Extremity Assessment: Generalized weakness            Vision       Perception     Praxis     Communication     Cognition Arousal: Alert Behavior During Therapy: WFL for tasks assessed/performed Cognition: No apparent impairments                               Following commands: Intact  Cueing   Cueing Techniques: Verbal cues  Exercises Total Joint Exercises Ankle Circles/Pumps: AROM, Right, Left, 20 reps, Seated Gluteal Sets: AROM, Both, 20 reps, Seated Other Exercises Other Exercises: Education provided on orthostatic hypotension exercises (including ankle pumps and gluteal squeezes) in preparation for ADL task engagement with staff.    Shoulder Instructions       General Comments      Pertinent Vitals/ Pain       Pain Assessment Pain Assessment: No/denies pain  Home Living                                           Prior Functioning/Environment              Frequency           Progress Toward Goals  OT Goals(current goals can now be found in the care plan section)  Progress towards OT goals: Progressing toward goals     Plan      Co-evaluation                 AM-PAC OT 6 Clicks Daily Activity     Outcome Measure                    End of Session        Activity Tolerance     Patient Left in bed;with call bell/phone within reach   Nurse Communication          Time: 8697-8669 OT Time Calculation (min): 28 min  Charges: OT General Charges $OT Visit: 1 Visit  Diana Cook OTR/L   Diana Cook 09/10/2023, 1:41 PM

## 2023-09-10 NOTE — Plan of Care (Signed)
  Problem: Education: Goal: Knowledge of General Education information will improve Description: Including pain rating scale, medication(s)/side effects and non-pharmacologic comfort measures Outcome: Progressing   Problem: Health Behavior/Discharge Planning: Goal: Ability to manage health-related needs will improve Outcome: Progressing   Problem: Clinical Measurements: Goal: Ability to maintain clinical measurements within normal limits will improve Outcome: Progressing Goal: Will remain free from infection Outcome: Progressing Goal: Diagnostic test results will improve Outcome: Progressing Goal: Respiratory complications will improve Outcome: Progressing Goal: Cardiovascular complication will be avoided Outcome: Progressing   Problem: Activity: Goal: Risk for activity intolerance will decrease Outcome: Progressing   Problem: Elimination: Goal: Will not experience complications related to bowel motility Outcome: Progressing Goal: Will not experience complications related to urinary retention Outcome: Progressing   Problem: Safety: Goal: Ability to remain free from injury will improve Outcome: Progressing   Problem: Skin Integrity: Goal: Risk for impaired skin integrity will decrease Outcome: Progressing   

## 2023-09-11 ENCOUNTER — Ambulatory Visit (INDEPENDENT_AMBULATORY_CARE_PROVIDER_SITE_OTHER)

## 2023-09-11 ENCOUNTER — Other Ambulatory Visit: Payer: Self-pay

## 2023-09-11 DIAGNOSIS — I442 Atrioventricular block, complete: Secondary | ICD-10-CM

## 2023-09-11 DIAGNOSIS — I951 Orthostatic hypotension: Secondary | ICD-10-CM | POA: Diagnosis not present

## 2023-09-11 MED ORDER — MIDODRINE HCL 10 MG PO TABS
10.0000 mg | ORAL_TABLET | Freq: Three times a day (TID) | ORAL | 2 refills | Status: DC
Start: 2023-09-11 — End: 2023-10-29
  Filled 2023-09-11: qty 90, 30d supply, fill #0

## 2023-09-11 NOTE — Progress Notes (Signed)
 Mobility Specialist - Progress Note    09/11/23 1040  Orthostatic Lying   BP- Lying 119/58  Orthostatic Sitting  BP- Sitting (!) 85/48  Orthostatic Standing at 0 minutes  BP- Standing at 0 minutes (!) 65/45  Orthostatic Standing at 3 minutes  BP- Standing at 3 minutes 101/54  Mobility  Activity Ambulated with assistance in room;Stood at bedside;Ambulated with assistance to bathroom  Level of Assistance Standby assist, set-up cues, supervision of patient - no hands on  Assistive Device Front wheel walker  Distance Ambulated (ft) 20 ft  Range of Motion/Exercises Active  Activity Response Tolerated well  Mobility Referral Yes  Mobility visit 1 Mobility  Mobility Specialist Start Time (ACUTE ONLY) 1010  Mobility Specialist Stop Time (ACUTE ONLY) 1042  Mobility Specialist Time Calculation (min) (ACUTE ONLY) 32 min   Pt resting in recliner on 4L upon entry. Pt STS and ambulates to bathroom SBA with RW. Pt endorses dizziness initially upon standing (from chair and from toilet). Pt returned to recliner and left with needs in reach and chair alarm activated.   Guido Rumble Mobility Specialist 09/11/23, 11:04 AM

## 2023-09-11 NOTE — Discharge Summary (Addendum)
 Physician Discharge Summary   OSA FOGARTY  female DOB: 1950-02-27  FMW:[id number]  PCP: Randeen Laine LABOR, MD  Admit date: 08/28/2023 Discharge date: 09/11/2023  Admitted From: home Disposition:  home Home Health: Yes CODE STATUS: Full code   Hospital Course:  For full details, please see H&P, progress notes, consult notes and ancillary notes.  Briefly,  Diana Cook is a 73 y.o. female with medical history significant for COPD, chronic respiratory failure on 4 L [place], CAD/MI, complete heart block s/p PPM, carotic stenosis s/p stenting, seizure disorder, lumbar radiculopathy, osteoarthritis, breast cancer s/p surgery and radiation therapy, HFpEF, who presented to the ED after syncope episode, due to orthostatic hypotension.    Patient was having diarrhea for past 1 week PTA, decreased p.o. intake that might be contributing to dehydration and orthostatic hypotension.    # Orthostatic hypotension, persistent --exacerbated by dehydration secondary to diarrhea and decreased p.o. intake, however, there seems to be an autonomic nervous system dysfunction that fails to keep up the BP as pt gets upright.   --Cortisol level within normal range -TTE earlier this month shows normal EF with G1DD but no valvular abnormalities  -Carotid re-stenosis considered however recent U/S of her carotid showed normal flow hemodynamics  --pt was started on midodrine  and Florinef , and at midodrine  10 mg TID and Florinef  0.1 mg BID, pt's systolic still dropped to 70's upon standing, with accompanying dizziness. --pt then was given MIVF@75  for 12 hours per day for 3 days, which seemed to increase the supine BP and standing BP.  Pt felt less dizzy with being upright, and was able to walk around in the room and to the bedside commode.  Florinef  then d/c'ed which didn't have much effect in pt's BP or orthostasis symptoms. --Prior to discharge, pt's BP still dropped from lying to standing, but BP increased to 90's-100's  after 3 min of standing, and pt was able to ambulate.   --pt discharged with midodrine  10 mg TID, and advised to wear abdominal binder and TED hose while up.  Pt was also advised to increase her oral hydration as pt admitted to drinking very little fluids PTA (voids only about twice a day), and pt's orthostasis symptoms improved with IVF.   # UTI UA positive, urine culture 10K E. coli, sensitive to cephalosporin and Cipro. (Resistant to ampicillin  and Augmentin ) Received 4 days of ceftriaxone  f/b 3 days of cefadroxil .  # Hypokalemia, # Hypomagnesemia, --supplemented PRN   # COPD # Chronic hypoxic respiratory failure on 4L [place] - No respiratory distress, remains on baseline 4 L [place] --cont home bronchodilators    # Chronic diastolic CHF  - TTE LVEF 55 to 60%, G1DD but no valvular abnormalities - Patient dry on exam - d/c'ed as needed Lasix    # Paroxysmal A-fib # Complete heart block s/p PPM - EKG shows atrial-sensed ventricularly-paced sinus rhythm - HR stable in the 70s to 80s --cont Eliquis    # HLD # CAD/MI # Carotic stenosis s/p stenting  --cont ASA and statin   # Seizure disorder - Continue Keppra    # GERD - Continue Protonix    Hyponatremia --mild and intermittent   Discharge Diagnoses:  Principal Problem:   Orthostatic hypotension Active Problems:   Lightheadedness   Syncope   30 Day Unplanned Readmission Risk Score    Flowsheet Row ED to Hosp-Admission (Current) from 08/28/2023 in [facility] 1C MEDICAL TELEMETRY  30 Day Unplanned Readmission Risk Score (%) 19.57 Filed at 09/11/2023  0801    This score is the patient's risk of an unplanned readmission within 30 days of being discharged (0 -100%). The score is based on dignosis, age, lab data, medications, orders, and past utilization.   Low:  0-14.9   Medium: 15-21.9   High: 22-29.9   Extreme: 30 and above         Discharge Instructions:  Allergies as of 09/11/2023       Reactions    Codeine Nausea And Vomiting   Morphine  And Codeine Nausea Only        Medication List     STOP taking these medications    furosemide  40 MG tablet Commonly known as: LASIX    nitrofurantoin  (macrocrystal-monohydrate) 100 MG capsule Commonly known as: Macrobid        TAKE these medications    acetaminophen  500 MG tablet Commonly known as: TYLENOL  Take 500-1,000 mg by mouth every 6 (six) hours as needed for mild pain or headache.   albuterol  108 (90 Base) MCG/ACT inhaler Commonly known as: VENTOLIN  HFA INHALE 2 PUFFS BY MOUTH EVERY 4 HOURS AS NEEDED FOR WHEEZING OR SHORTNESS OF BREATH   apixaban  5 MG Tabs tablet Commonly known as: Eliquis  Take 1 tablet (5 mg total) by mouth 2 (two) times daily.   aspirin  EC 81 MG tablet Take 1 tablet (81 mg total) by mouth daily.   atorvastatin  40 MG tablet Commonly known as: LIPITOR Take 1 tablet by mouth once daily   benzonatate  200 MG capsule Commonly known as: TESSALON  Take 1 capsule (200 mg total) by mouth 2 (two) times daily as needed for cough.   FLUoxetine  20 MG capsule Commonly known as: PROZAC  Take 1 capsule by mouth once daily   gabapentin  100 MG capsule Commonly known as: NEURONTIN  Take 200 mg by mouth 2 (two) times daily as needed.   levETIRAcetam  250 MG tablet Commonly known as: KEPPRA  Take 250 mg by mouth 2 (two) times daily.   midodrine  10 MG tablet Commonly known as: PROAMATINE  Take 1 tablet (10 mg total) by mouth 3 (three) times daily with meals.   OXYGEN  Inhale 4 L into the lungs continuous.   pantoprazole  40 MG tablet Commonly known as: Protonix  Take 1 tablet (40 mg total) by mouth daily.   Stiolto Respimat  2.5-2.5 MCG/ACT Aers Generic drug: Tiotropium Bromide-Olodaterol INHALE 2 PUFFS BY MOUTH ONCE DAILY   Vitamin D3 50 MCG (2000 UT) capsule Take 2,000 Units by mouth daily.   zolpidem  10 MG tablet Commonly known as: AMBIEN  TAKE 1/2 TO 1 (ONE-HALF TO ONE) TABLET BY MOUTH AT BEDTIME AS  NEEDED FOR SLEEP               Durable Medical Equipment  (From admission, onward)           Start     Ordered   09/04/23 1547  For home use only DME high strength lightweight manual wheelchair with seat cushion  Once       Comments: Patient suffers from Orthostatic hypotension which impairs their ability to perform daily activities like bathing, dressing, feeding, grooming, and toileting in the home.  A cane, crutch, or walker will not resolve  issue with performing activities of daily living. A wheelchair will allow patient to safely perform daily activities.Length of need Lifetime.  Patient self-propels the wheelchair while engaging in frequent activities such as laundry, meals, and toileting which cannot be performed in a standard or lightweight wheelchair due to the weight of the chair. Accessories: elevating leg  rests (ELRs), wheel locks, extensions and anti-tippers.   09/04/23 1547             Follow-up Information     Tower, Laine LABOR, MD Follow up in 1 week(s).   Specialties: Family Medicine, Radiology Why: Hospital follow up Contact information: [address] [place] [address] [phone]         [facility] Follow up.   Why: Agency will call patient to schedule        [facility]  Follow up.   Why: Please call when ready to have Wheel Chair delivered Contact information: [address] [place] [address] [phone]                 Allergies  Allergen Reactions   Codeine Nausea And Vomiting   Morphine  And Codeine Nausea Only     The results of significant diagnostics from this hospitalization (including imaging, microbiology, ancillary and laboratory) are listed below for reference.   Consultations:   Procedures/Studies: CT Angio Chest PE W and/or Wo Contrast Result Date: 08/28/2023 CLINICAL DATA:  Hypoxia.  History of lung cancer. EXAM: CT ANGIOGRAPHY CHEST WITH CONTRAST TECHNIQUE:  Multidetector CT imaging of the chest was performed using the standard protocol during bolus administration of intravenous contrast. Multiplanar CT image reconstructions and MIPs were obtained to evaluate the vascular anatomy. RADIATION DOSE REDUCTION: This exam was performed according to the departmental dose-optimization program which includes automated exposure control, adjustment of the mA and/or kV according to patient size and/or use of iterative reconstruction technique. CONTRAST:  75mL OMNIPAQUE  IOHEXOL  350 MG/ML SOLN COMPARISON:  06/24/2023 FINDINGS: Cardiovascular: Left pacer in place with leads in the right atrium and right ventricle. Heart is normal size. Aorta is normal caliber. Coronary artery and aortic atherosclerosis. No filling defects in the pulmonary arteries to suggest pulmonary emboli. Mediastinum/Nodes: No mediastinal, hilar, or axillary adenopathy. Trachea and esophagus are unremarkable. Thyroid  unremarkable. Lungs/Pleura: Moderate centrilobular emphysema. Post radiation changes/scarring in the right mid lung, stable. Left lower lobe pulmonary nodule measures up to 10 mm, stable since prior study. Lingular nodule measures 3 mm on image 92, stable. 2 mm right lower lobe pulmonary nodule on image 94 is stable. 5 mm medial left upper lobe nodule on image 48. In retrospect, this was present on prior study and is stable. 7 mm right upper lobe pulmonary nodule on image 56 is stable since prior study. No new or enlarging pulmonary nodules. Dependent linear densities in the lower lobes compatible with dependent atelectasis. No effusions. Upper Abdomen: No acute findings Musculoskeletal: Chest wall soft tissues are unremarkable. No acute bony abnormality. Review of the MIP images confirms the above findings. IMPRESSION: No evidence of pulmonary embolus. Post treatment changes in the right mid lung. Scattered bilateral pulmonary nodules are stable since prior study. Coronary artery disease. Aortic  Atherosclerosis (ICD10-I70.0) and Emphysema (ICD10-J43.9). Electronically Signed   By: Franky Crease M.D.   On: 08/28/2023 18:52   DG Chest Portable 1 View Result Date: 08/28/2023 CLINICAL DATA:  Hypoxia, syncopal episode. EXAM: PORTABLE CHEST 1 VIEW COMPARISON:  08/26/2023, 07/12/2023. FINDINGS: The heart size and mediastinal contours are stable. There is atherosclerotic calcification of the aorta. Mild atelectasis is noted at the left lung base. There is stable linear scarring or atelectasis in the mid right lung. No effusion or pneumothorax is seen. Scattered nodular densities are noted in the lungs measuring up to 9 mm. A dual lead pacemaker device is present over the  left chest. No acute osseous abnormality. IMPRESSION: 1. No active disease. 2. Mild atelectasis or scarring at the left lung base and mid right lung. 3. Scattered pulmonary nodules, recently evaluated with PET-CT. Electronically Signed   By: Leita Birmingham M.D.   On: 08/28/2023 16:46   CT Head Wo Contrast Result Date: 08/28/2023 CLINICAL DATA:  Head trauma, syncopal episode 1 hour ago. EXAM: CT HEAD WITHOUT CONTRAST TECHNIQUE: Contiguous axial images were obtained from the base of the skull through the vertex without intravenous contrast. RADIATION DOSE REDUCTION: This exam was performed according to the departmental dose-optimization program which includes automated exposure control, adjustment of the mA and/or kV according to patient size and/or use of iterative reconstruction technique. COMPARISON:  CT head 02/12/2022. FINDINGS: Brain: No acute intracranial hemorrhage. No CT evidence of acute infarct. Nonspecific hypoattenuation in the periventricular and subcortical white matter favored to reflect chronic microvascular ischemic changes. Remote lacunar infarct in the basal ganglia. No edema, mass effect, or midline shift. The basilar cisterns are patent. Ventricles: The ventricles are normal. Vascular: No hyperdense vessel or unexpected  calcification. Skull: No acute or aggressive finding. Orbits: Orbits are symmetric. Sinuses: The visualized paranasal sinuses are clear. Other: Small hematoma in the left frontal scalp. Mastoid air cells are clear. IMPRESSION: No CT evidence of acute intracranial abnormality. Small left frontal scalp hematoma. Chronic microvascular ischemic changes. Remote lacunar infarct in the left basal ganglia. Electronically Signed   By: Donnice Mania M.D.   On: 08/28/2023 15:05   CT Cervical Spine Wo Contrast Result Date: 08/28/2023 CLINICAL DATA:  Neck trauma.  Syncopal episode. EXAM: CT CERVICAL SPINE WITHOUT CONTRAST TECHNIQUE: Multidetector CT imaging of the cervical spine was performed without intravenous contrast. Multiplanar CT image reconstructions were also generated. RADIATION DOSE REDUCTION: This exam was performed according to the departmental dose-optimization program which includes automated exposure control, adjustment of the mA and/or kV according to patient size and/or use of iterative reconstruction technique. COMPARISON:  Cervical spine radiographs 02/15/2020. Cervical MRI 03/28/2020. FINDINGS: Alignment: Chronic anterolisthesis at C4-5, similar to prior imaging. Skull base and vertebrae: No evidence of acute cervical spine fracture or traumatic subluxation. Soft tissues and spinal canal: No prevertebral fluid or swelling. No visible canal hematoma. Disc levels: Chronic multilevel spondylosis with loss of disc height, uncinate spurring and facet hypertrophy. The spondylosis is most advanced at C5-6 and C6-7. No large disc herniation identified. Upper chest: Grossly stable 1.4 cm left thyroid  nodule for which no specific follow-up imaging is recommended. Clear lung apices. Other: Bilateral carotid atherosclerosis with right carotid stent. Advanced TMJ degenerative changes bilaterally. IMPRESSION: 1. No evidence of acute cervical spine fracture, traumatic subluxation or static signs of instability. 2.  Chronic multilevel cervical spondylosis. Electronically Signed   By: Elsie Perone M.D.   On: 08/28/2023 15:03   DG Chest Port 1 View Result Date: 08/26/2023 CLINICAL DATA:  Provided history: Sepsis Low blood pressure and hypoxia.  History of lung cancer. EXAM: PORTABLE CHEST 1 VIEW COMPARISON:  Chest CT 06/24/2023 FINDINGS: Dual lead left-sided pacemaker in place. Upper normal heart size with stable mediastinal contours. Aortic atherosclerosis. Bandlike scarring in the right mid lung. Chronic hyperinflation and interstitial coarsening. The left lower lobe pulmonary nodule on CT is not well-defined by radiograph. No pneumothorax or pleural effusion. IMPRESSION: 1. Chronic hyperinflation and interstitial coarsening. No acute findings. 2. The left lower lobe pulmonary nodule on CT is not well-defined by radiograph. Electronically Signed   By: Andrea Gasman M.D.   On: 08/26/2023  14:08      Labs: BNP (last 3 results) No results for input(s): BNP in the last 8760 hours. Basic Metabolic Panel: Recent Labs  Lab 09/05/23 0625 09/06/23 0338  NA 137 134*  K 4.3 3.9  CL 102 101  CO2 25 25  GLUCOSE 110* 114*  BUN 14 15  CREATININE 0.91 0.75  CALCIUM  9.2 8.9   Liver Function Tests: No results for input(s): AST, ALT, ALKPHOS, BILITOT, PROT, ALBUMIN in the last 168 hours. No results for input(s): LIPASE, AMYLASE in the last 168 hours. No results for input(s): AMMONIA in the last 168 hours. CBC: Recent Labs  Lab 09/05/23 0625 09/06/23 0338  WBC 6.9 7.3  HGB 10.3* 10.3*  HCT 32.2* 31.3*  MCV 91.0 90.7  PLT 373 380   Cardiac Enzymes: No results for input(s): CKTOTAL, CKMB, CKMBINDEX, TROPONINI in the last 168 hours. BNP: Invalid input(s): POCBNP CBG: No results for input(s): GLUCAP in the last 168 hours. D-Dimer No results for input(s): DDIMER in the last 72 hours. Hgb A1c No results for input(s): HGBA1C in the last 72 hours. Lipid Profile No  results for input(s): CHOL, HDL, LDLCALC, TRIG, CHOLHDL, LDLDIRECT in the last 72 hours. Thyroid  function studies No results for input(s): TSH, T4TOTAL, T3FREE, THYROIDAB in the last 72 hours.  Invalid input(s): FREET3 Anemia work up No results for input(s): VITAMINB12, FOLATE, FERRITIN, TIBC, IRON, RETICCTPCT in the last 72 hours. Urinalysis    Component Value Date/Time   COLORURINE YELLOW (A) 08/29/2023 2050   APPEARANCEUR CLOUDY (A) 08/29/2023 2050   APPEARANCEUR Clear 04/01/2013 0815   LABSPEC 1.024 08/29/2023 2050   LABSPEC >1.060 04/01/2013 0815   PHURINE 6.0 08/29/2023 2050   GLUCOSEU NEGATIVE 08/29/2023 2050   GLUCOSEU NEGATIVE 05/05/2023 1410   HGBUR MODERATE (A) 08/29/2023 2050   BILIRUBINUR NEGATIVE 08/29/2023 2050   BILIRUBINUR Negative 02/24/2023 0951   BILIRUBINUR Negative 04/01/2013 0815   KETONESUR NEGATIVE 08/29/2023 2050   PROTEINUR 100 (A) 08/29/2023 2050   UROBILINOGEN 0.2 05/05/2023 1410   NITRITE NEGATIVE 08/29/2023 2050   LEUKOCYTESUR LARGE (A) 08/29/2023 2050   LEUKOCYTESUR Negative 04/01/2013 0815   Sepsis Labs Recent Labs  Lab 09/05/23 0625 09/06/23 0338  WBC 6.9 7.3   Microbiology No results found for this or any previous visit (from the past 240 hours).   Total time spend on discharging this patient, including the last patient exam, discussing the hospital stay, instructions for ongoing care as it relates to all pertinent caregivers, as well as preparing the medical discharge records, prescriptions, and/or referrals as applicable, is 45 minutes.    Ellouise Haber, MD  [facility] 09/11/2023, 9:28 AM

## 2023-09-11 NOTE — Plan of Care (Signed)

## 2023-09-11 NOTE — Progress Notes (Signed)
 Occupational Therapy Treatment Patient Details Name: Diana Cook MRN: [id number] DOB: 02/29/1950 Today's Date: 09/11/2023   History of present illness 73 y.o. female with medical history significant for lung ca, s/p radiation, COPD, chronic respiratory failure on 4 L [place], CAD/MI, complete heart block s/p PPM, carotic stenosis s/p stenting on Eliquis , seizure disorder, lumbar radiculopathy, osteoarthritis, breast cancer s/p surgery and radiation therapy, HFpEF, HLD and GERD who presented to the ED after syncope episode.   OT comments  Pt seen for OT treatment on this date. Upon arrival to room pt seated in recliner chair, agreeable to tx and reports that she is discharging home with home health services today per her MD. Pt requires SBA for standing to don abdominal binder, seated dressing tasks for UB/LB dressing with cuing for energy conservation and orthostatic hypotension management (BP: 123/64, HR: 72).  Patient reports being anxious today and cannot explain why.  Volunteer present to transport patient.        If plan is discharge home, recommend the following:  A little help with walking and/or transfers;A little help with bathing/dressing/bathroom;Assistance with cooking/housework;Assist for transportation;Help with stairs or ramp for entrance   Equipment Recommendations       Recommendations for Other Services      Precautions / Restrictions Precautions Precautions: Fall Precaution/Restrictions Comments: MONITOR ORTHOSTATICS Required Braces or Orthoses: Other Brace Other Brace: abdominal binder and ted hose       Mobility Bed Mobility                    Transfers Overall transfer level: Needs assistance Equipment used: Rolling walker (2 wheels) Transfers: Sit to/from Stand Sit to Stand: Contact guard assist, Supervision           General transfer comment: orthostatic hypotension cuing in preparation for standing during toileting     Balance Overall balance  assessment: Needs assistance Sitting-balance support: Feet supported Sitting balance-Leahy Scale: Normal Sitting balance - Comments: steady sitting, reaching outside BOS   Standing balance support: Bilateral upper extremity supported, During functional activity, Reliant on assistive device for balance Standing balance-Leahy Scale: Fair                             ADL either performed or assessed with clinical judgement   ADL                   Upper Body Dressing : Set up   Lower Body Dressing: Contact guard assist Lower Body Dressing Details (indicate cue type and reason): standing due to BP concerns                    Extremity/Trunk Assessment Upper Extremity Assessment Upper Extremity Assessment: Generalized weakness       Cervical / Trunk Assessment Cervical / Trunk Exceptions: flexed cervical posture due to dizziness when looking forward    Vision Baseline Vision/History: 0 No visual deficits     Perception     Praxis     Communication     Cognition Arousal: Alert Behavior During Therapy: WFL for tasks assessed/performed                                 Following commands: Intact        Cueing      Exercises Other Exercises Other Exercises: Education provided on orthostatic hypotension exercises (including  ankle pumps and gluteal squeezes) in preparation for ADL task engagement at home and the placement for 3:1 commode Other Exercises: OT facilitated ADL management with edu and assist as described above. Additional time for vitals check during session.    Shoulder Instructions       General Comments      Pertinent Vitals/ Pain       Pain Assessment Pain Assessment: No/denies pain  Home Living                                          Prior Functioning/Environment              Frequency           Progress Toward Goals  OT Goals(current goals can now be found in the care plan  section)  Progress towards OT goals: Progressing toward goals     Plan      Co-evaluation                 AM-PAC OT 6 Clicks Daily Activity     Outcome Measure                    End of Session Equipment Utilized During Treatment: Rolling walker (2 wheels);Oxygen       Activity Tolerance Treatment limited secondary to medical complications (Comment)   Patient Left in chair;with call bell/phone within reach   Nurse Communication Mobility status        Time: 8883-8863 OT Time Calculation (min): 20 min  Charges: OT General Charges $OT Visit: 1 Visit  Harlene Sharps OTR/L   Harlene LITTIE Sharps 09/11/2023, 11:39 AM

## 2023-09-11 NOTE — Progress Notes (Signed)
 Pt discharged to home. AVS discharge instructions provided by Daphne Naomi PEAK. All questions and concerns answered. Pt verbalized understanding. All PIVs removed, sites WDL. Pt transported home on 5L [place] with husband.

## 2023-09-14 ENCOUNTER — Emergency Department

## 2023-09-14 ENCOUNTER — Telehealth: Payer: Self-pay

## 2023-09-14 ENCOUNTER — Emergency Department
Admission: EM | Admit: 2023-09-14 | Discharge: 2023-09-14 | Disposition: A | Discharge status: Home / Self Care | Attending: Emergency Medicine | Admitting: Emergency Medicine

## 2023-09-14 ENCOUNTER — Ambulatory Visit: Payer: Self-pay

## 2023-09-14 ENCOUNTER — Telehealth: Payer: Self-pay | Admitting: Family Medicine

## 2023-09-14 DIAGNOSIS — R55 Syncope and collapse: Secondary | ICD-10-CM | POA: Insufficient documentation

## 2023-09-14 DIAGNOSIS — J449 Chronic obstructive pulmonary disease, unspecified: Secondary | ICD-10-CM | POA: Insufficient documentation

## 2023-09-14 DIAGNOSIS — I959 Hypotension, unspecified: Secondary | ICD-10-CM | POA: Diagnosis present

## 2023-09-14 LAB — CUP PACEART REMOTE DEVICE CHECK
Battery Remaining Longevity: 126 mo
Battery Remaining Percentage: 100 %
Brady Statistic RA Percent Paced: 3 %
Brady Statistic RV Percent Paced: 100 %
Date Time Interrogation Session: 20250718180100
Implantable Lead Connection Status: 753985
Implantable Lead Connection Status: 753985
Implantable Lead Implant Date: 20130830
Implantable Lead Implant Date: 20130830
Implantable Lead Location: 753859
Implantable Lead Location: 753860
Implantable Lead Model: 4456
Implantable Lead Model: 4479
Implantable Lead Serial Number: 473325
Implantable Lead Serial Number: 523784
Implantable Pulse Generator Implant Date: 20211011
Lead Channel Impedance Value: 374 Ohm
Lead Channel Impedance Value: 539 Ohm
Lead Channel Pacing Threshold Amplitude: 0.5 V
Lead Channel Pacing Threshold Pulse Width: 0.4 ms
Lead Channel Setting Pacing Amplitude: 2 V
Lead Channel Setting Pacing Amplitude: 2 V
Lead Channel Setting Pacing Pulse Width: 0.4 ms
Lead Channel Setting Sensing Sensitivity: 2.5 mV
Pulse Gen Serial Number: 949303
Zone Setting Status: 755011

## 2023-09-14 LAB — CBC WITH DIFFERENTIAL/PLATELET
Abs Immature Granulocytes: 0.06 K/uL (ref 0.00–0.07)
Basophils Absolute: 0.1 K/uL (ref 0.0–0.1)
Basophils Relative: 1 %
Eosinophils Absolute: 0.1 K/uL (ref 0.0–0.5)
Eosinophils Relative: 2 %
HCT: 34.2 % — ABNORMAL LOW (ref 36.0–46.0)
Hemoglobin: 11.2 g/dL — ABNORMAL LOW (ref 12.0–15.0)
Immature Granulocytes: 1 %
Lymphocytes Relative: 12 %
Lymphs Abs: 0.9 K/uL (ref 0.7–4.0)
MCH: 29.6 pg (ref 26.0–34.0)
MCHC: 32.7 g/dL (ref 30.0–36.0)
MCV: 90.5 fL (ref 80.0–100.0)
Monocytes Absolute: 0.9 K/uL (ref 0.1–1.0)
Monocytes Relative: 11 %
Neutro Abs: 6 K/uL (ref 1.7–7.7)
Neutrophils Relative %: 73 %
Platelets: 426 K/uL — ABNORMAL HIGH (ref 150–400)
RBC: 3.78 MIL/uL — ABNORMAL LOW (ref 3.87–5.11)
RDW: 13 % (ref 11.5–15.5)
WBC: 8.1 K/uL (ref 4.0–10.5)
nRBC: 0 % (ref 0.0–0.2)

## 2023-09-14 LAB — COMPREHENSIVE METABOLIC PANEL WITH GFR
ALT: 13 U/L (ref 0–44)
AST: 21 U/L (ref 15–41)
Albumin: 3.5 g/dL (ref 3.5–5.0)
Alkaline Phosphatase: 77 U/L (ref 38–126)
Anion gap: 11 (ref 5–15)
BUN: 21 mg/dL (ref 8–23)
CO2: 26 mmol/L (ref 22–32)
Calcium: 9.5 mg/dL (ref 8.9–10.3)
Chloride: 100 mmol/L (ref 98–111)
Creatinine, Ser: 0.92 mg/dL (ref 0.44–1.00)
GFR, Estimated: >60 mL/min (ref >60)
Glucose, Bld: 109 mg/dL — ABNORMAL HIGH (ref 70–99)
Potassium: 3.6 mmol/L (ref 3.5–5.1)
Sodium: 137 mmol/L (ref 135–145)
Total Bilirubin: 0.9 mg/dL (ref 0.0–1.2)
Total Protein: 7.1 g/dL (ref 6.5–8.1)

## 2023-09-14 LAB — TROPONIN I (HIGH SENSITIVITY): Troponin I (High Sensitivity): 14 ng/L (ref <18)

## 2023-09-14 MED ORDER — SODIUM CHLORIDE 0.9 % IV BOLUS
500.0000 mL | Freq: Once | INTRAVENOUS | Status: AC
  Administered 2023-09-14: 500 mL via INTRAVENOUS

## 2023-09-14 NOTE — Telephone Encounter (Signed)
 Pt has already been to ER and d/c and PCP has records. I don't see an appt with Dr. Fernande so I don't feel sending this note to him is appropriate since we are the PCP office.

## 2023-09-14 NOTE — Telephone Encounter (Signed)
 Copied from CRM [id number]. Topic: General - Other >> Sep 11, 2023  4:44 PM Kevelyn M wrote: Reason for CRM:  Cindy with [facility] calling because she is discharged from the hospital.

## 2023-09-14 NOTE — ED Provider Notes (Signed)
 [facility] Provider Note    Event Date/Time   First MD Initiated Contact with Patient 09/14/23 1007     (approximate)   History   Loss of Consciousness   HPI  Diana Cook is a 73 y.o. female who comes in with concerns for loss of consciousness.  Patient was recently admitted to the hospital for low blood pressures they stopped taking her furosemide  and her Macrobid  and started patient on midodrine  10 mg 3 times daily.  She reports that since discharge she is still syncopized multiple times.  She reports that it happens when she stands up that she feels like she is in a pass out does not think she hit her head but is on Eliquis .  She denies any injuries from these events.   I tried to review the discharge summary from 09/11/2023 but it was not completed.  I read the progress note from 7/17.  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  She was treated for UTI.  She does report being on her baseline 4 L of oxygen .  She does have a pacemaker  Physical Exam   Triage Vital Signs: ED Triage Vitals  Encounter Vitals Group     BP 09/14/23 1014 (!) 139/59     Girls Systolic BP Percentile --      Girls Diastolic BP Percentile --      Boys Systolic BP Percentile --      Boys Diastolic BP Percentile --      Pulse Rate 09/14/23 1014 67     Resp 09/14/23 1014 20     Temp 09/14/23 1014 98.2 F (36.8 C)     Temp Source 09/14/23 1014 Oral     SpO2 09/14/23 1014 94 %     Weight 09/14/23 1016 205 lb 9.6 oz (93.3 kg)     Height 09/14/23 1016 5' 9 (1.753 m)     Head Circumference --      Peak Flow --      Pain Score 09/14/23 1016 0     Pain Loc --      Pain Education --      Exclude from Growth Chart --     Most recent  vital signs: Vitals:   09/14/23 1014  BP: (!) 139/59  Pulse: 67  Resp: 20  Temp: 98.2 F (36.8 C)  SpO2: 94%     General: Awake, no distress.  CV:  Good peripheral perfusion.  Resp:  Normal effort.  Abd:  No distention.  Soft and nontender Other:  No tenderness on her chest wall abdomen.  Moving all extremities well.   ED Results / Procedures / Treatments   Labs (all labs ordered are listed, but only abnormal results are displayed) Labs Reviewed  CBC WITH DIFFERENTIAL/PLATELET  COMPREHENSIVE METABOLIC PANEL WITH GFR  URINALYSIS, ROUTINE W REFLEX MICROSCOPIC  CBC WITH DIFFERENTIAL/PLATELET  TROPONIN I (HIGH SENSITIVITY)     EKG  My interpretation of EKG:  Sinus rate of 66 without any ST elevation or T wave inversions, normal intervals  RADIOLOGY I have reviewed the ct personally and interpreted no ICH   PROCEDURES:  Critical Care performed: No  .1-3 Lead EKG Interpretation  Performed by: Ernest Ronal BRAVO, MD Authorized by: Ernest Ronal BRAVO, MD  Interpretation: normal     ECG rate:  60   ECG rate assessment: normal     Rhythm: sinus rhythm     Ectopy: none     Conduction: normal      MEDICATIONS ORDERED IN ED: Medications - No data to display   IMPRESSION / MDM / ASSESSMENT AND PLAN / ED COURSE  I reviewed the triage vital signs and the nursing notes.   Patient's presentation is most consistent with acute presentation with potential threat to life or bodily function.   Patient comes in with concerns of recurrent syncope being treated with midodrine  but still very apparent here patient has positive orthostatics will give some IV fluid check labs to evaluate for Electra malaise, AKI.  Given patient on Eliquis  recommend getting CT imaging to ensure no evidence of intracranial hemorrhage and she was okay with proceeding with this. Will interrogate PM.  Troponin is negative.  CMP reassuring CBC reassuring.  Patient is orthostatic positive given some  fluids.  Blood pressures dropping to the 80s very symptomatic unclear what else can be done for patient given she is already on midodrine  and did take it this morning doubt PE given on Eliquis .  Will discuss to the hospital team for admission for further workup, monitoring  Pacemaker report came back normal  The patient is on the cardiac monitor to evaluate for evidence of arrhythmia and/or significant heart rate changes.      FINAL CLINICAL IMPRESSION(S) / ED DIAGNOSES   Final diagnoses:  Syncope, unspecified syncope type     Rx / DC Orders   ED Discharge Orders     None        Note:  This document was prepared using Dragon voice recognition software and may include unintentional dictation errors.   Ernest Ronal BRAVO, MD 09/14/23 [phone]

## 2023-09-14 NOTE — ED Notes (Signed)
[employer] pacemaker interrogated at bedside.

## 2023-09-14 NOTE — Transitions of Care (Post Inpatient/ED Visit) (Signed)
   09/14/2023  Name: Diana Cook MRN: [id number] DOB: January 27, 1951  Today's TOC FU Call Status: Today's TOC FU Call Status:: Successful TOC FU Call Completed TOC FU Call Complete Date: 09/14/23 Patient's Name and Date of Birth confirmed.  Transition Care Management Follow-up Telephone Call Date of Discharge: 09/11/23 Discharge Facility: [facility]) Type of Discharge: Inpatient Admission Primary Inpatient Discharge Diagnosis:: Orthostatic Hypertension How have you been since you were released from the hospital?: Worse Any questions or concerns?: Yes Patient Questions/Concerns:: [name] states patient is on her way to the ED via ambulance.  He states patient's blood pressure continues to drop so low that everytime she sits up she passes out. Patient Questions/Concerns Addressed: Other: (Spouse discussed patients current health status and stated she was on her way to the emergency department by ambulance.)    Jowel Waltner RN, BSN, CCM [place]  [facility], Population Health Case Manager Phone: [phone]

## 2023-09-14 NOTE — Telephone Encounter (Signed)
 FYI to PCP

## 2023-09-14 NOTE — ED Triage Notes (Signed)
 Patient has been having multiple syncopal episodes since her discharge from [facility] on 07/18. Patient is A&O x4. Patient states she has had around seven syncopal episodes at home. Patient takes Midodrine  at home for hypotension.

## 2023-09-14 NOTE — Discharge Summary (Signed)
 Physician Discharge Summary   Patient: Diana Cook MRN: [id number] DOB: [birth year]  Admit date:     09/14/2023  Discharge date: 09/14/23  Discharge Physician: Cort ONEIDA Mana   PCP: Randeen Laine LABOR, MD   Recommendations at discharge:    PCP in 3 days  Discharge Diagnoses:  Syncope secondary to orthostatic hypotension  Assessment and Plan:  Patient was recently diagnosed with orthostatic hypotension.  After prolonged hospitalization patient discharged home on midodrine  10 mg 3 times a day along with TED hose and abdominal binder when standing up and ambulate.  Family found however the patient continued to experience symptomatic hypotension, baseline blood pressure while lying down SBP in the range of 150-160, however when standing up, SBP dropped to 70-80 and patient immediately become symptomatic.  Over the weekend patient had 3 episode of syncope when she passed out after rising up from her bed.  Patient came back to ED today for evaluation of recurrent syncopes.  I went through her hospitalization record from recent admissions, she has underwent extensive workup including echocardiogram, PT evaluation.  She was started on midodrine  and gradually titrated to maximum dosage on discharge.  She was 1 time on fludrocortisone , however after she was on fludrocortisone  for few days, her orthostatic symptoms not improving and workup showed that she does not have adrenal insufficiency and the fludrocortisone  was continued.  I had extensive discussion with patient and her husband at bedside regarding that, both expressed understanding and agreed with current management.  Patient also expressed that she wants to go to [facility] for second opinion.  Husband will drive patient to [facility] ED immediately.  Recommend use wheelchair to move patient and applying abdominal binder and TED hose during transfer.  Patient and family agreed.  Pain control - [place]  Controlled Substance Reporting  System database was reviewed. and patient was instructed, not to drive, operate heavy machinery, perform activities at heights, swimming or participation in water activities or provide baby-sitting services while on Pain, Sleep and Anxiety Medications; until their outpatient Physician has advised to do so again. Also recommended to not to take more than prescribed Pain, Sleep and Anxiety Medications.  Consultants: None Procedures performed: None Disposition: Home Diet recommendation:  Regular diet DISCHARGE MEDICATION: Allergies as of 09/14/2023       Reactions   Codeine Nausea And Vomiting   Morphine  And Codeine Nausea Only        Medication List     STOP taking these medications    furosemide  40 MG tablet Commonly known as: LASIX        TAKE these medications    acetaminophen  500 MG tablet Commonly known as: TYLENOL  Take 500-1,000 mg by mouth every 6 (six) hours as needed for mild pain or headache.   albuterol  108 (90 Base) MCG/ACT inhaler Commonly known as: VENTOLIN  HFA INHALE 2 PUFFS BY MOUTH EVERY 4 HOURS AS NEEDED FOR WHEEZING OR SHORTNESS OF BREATH   apixaban  5 MG Tabs tablet Commonly known as: Eliquis  Take 1 tablet (5 mg total) by mouth 2 (two) times daily.   aspirin  EC 81 MG tablet Take 1 tablet (81 mg total) by mouth daily.   atorvastatin  40 MG tablet Commonly known as: LIPITOR Take 1 tablet by mouth once daily   benzonatate  200 MG capsule Commonly known as: TESSALON  Take 1 capsule (200 mg total) by mouth 2 (two) times daily as needed for cough.   FLUoxetine  20 MG capsule Commonly known as: PROZAC  Take 1 capsule by  mouth once daily   gabapentin  100 MG capsule Commonly known as: NEURONTIN  Take 200 mg by mouth 2 (two) times daily as needed.   levETIRAcetam  250 MG tablet Commonly known as: KEPPRA  Take 250 mg by mouth 2 (two) times daily.   midodrine  10 MG tablet Commonly known as: PROAMATINE  Take 1 tablet (10 mg total) by mouth 3 (three) times  daily with meals.   OXYGEN  Inhale 4 L into the lungs continuous.   pantoprazole  40 MG tablet Commonly known as: Protonix  Take 1 tablet (40 mg total) by mouth daily.   Stiolto Respimat  2.5-2.5 MCG/ACT Aers Generic drug: Tiotropium Bromide-Olodaterol INHALE 2 PUFFS BY MOUTH ONCE DAILY   Vitamin D3 50 MCG (2000 UT) capsule Take 2,000 Units by mouth daily.   zolpidem  10 MG tablet Commonly known as: AMBIEN  TAKE 1/2 TO 1 (ONE-HALF TO ONE) TABLET BY MOUTH AT BEDTIME AS NEEDED FOR SLEEP        Discharge Exam: Filed Weights   09/14/23 1016  Weight: 93.3 kg   Eyes: PERRL, lids and conjunctivae normal ENMT: Mucous membranes are moist. Posterior pharynx clear of any exudate or lesions.Normal dentition.  Neck: normal, supple, no masses, no thyromegaly Respiratory: clear to auscultation bilaterally, no wheezing, no crackles. Normal respiratory effort. No accessory muscle use.  Cardiovascular: Regular rate and rhythm, no murmurs / rubs / gallops. No extremity edema. 2+ pedal pulses. No carotid bruits.  Abdomen: no tenderness, no masses palpated. No hepatosplenomegaly. Bowel sounds positive.  Musculoskeletal: no clubbing / cyanosis. No joint deformity upper and lower extremities. Good ROM, no contractures. Normal muscle tone.  Skin: no rashes, lesions, ulcers. No induration Neurologic: CN 2-12 grossly intact. Sensation intact, DTR normal.  Muscle strength 5/5 on both sides Psychiatric: Normal judgment and insight. Alert and oriented x 3. Normal mood.     Condition at discharge: fair  The results of significant diagnostics from this hospitalization (including imaging, microbiology, ancillary and laboratory) are listed below for reference.   Imaging Studies: CT Cervical Spine Wo Contrast Result Date: 09/14/2023 CLINICAL DATA:  Trauma, cervical spine trauma, multiple syncopal episodes. EXAM: CT CERVICAL SPINE WITHOUT CONTRAST TECHNIQUE: Multidetector CT imaging of the cervical spine was  performed without intravenous contrast. Multiplanar CT image reconstructions were also generated. RADIATION DOSE REDUCTION: This exam was performed according to the departmental dose-optimization program which includes automated exposure control, adjustment of the mA and/or kV according to patient size and/or use of iterative reconstruction technique. COMPARISON:  CT cervical spine 08/28/2023. FINDINGS: Alignment: Straightening of the normal cervical lordosis. Similar anterolisthesis of C4 on C5 likely related to degenerative changes. No facet subluxation or dislocation. Skull base and vertebrae: No compression fracture or displaced fracture in the cervical spine. No suspicious osseous lesion. Degenerative endplate changes most pronounced at C5-6 and C6-7. Degenerative changes noted in the temporomandibular joints bilaterally. Soft tissues and spinal canal: No prevertebral fluid or swelling. No visible canal hematoma. 1.4 cm nodule in the left thyroid  lobe. Disc levels: Disc space narrowing most pronounced at C6-7. Degenerative endplate osteophytes. Facet arthrosis at multiple levels. No high-grade osseous spinal canal stenosis. Foraminal stenosis at multiple levels. Upper chest: Emphysema. Other: Redemonstrated right carotid stent. IMPRESSION: No compression fracture or displaced fracture in the cervical spine. Electronically Signed   By: Donnice Mania M.D.   On: 09/14/2023 12:17   CT HEAD WO CONTRAST ( ) Result Date: 09/14/2023 CLINICAL DATA:  Head trauma, intracranial arterial injury suspected EXAM: CT HEAD WITHOUT CONTRAST TECHNIQUE: Contiguous axial images were obtained from  the base of the skull through the vertex without intravenous contrast. RADIATION DOSE REDUCTION: This exam was performed according to the departmental dose-optimization program which includes automated exposure control, adjustment of the mA and/or kV according to patient size and/or use of iterative reconstruction technique.  COMPARISON:  CT of the head dated August 28, 2023. FINDINGS: Brain: There is age related atrophy and mild periventricular and deep cerebral white matter disease. There is no evidence of acute intracranial injury. There is no evidence of hemorrhage, mass, cortical infarct or hydrocephalus. Vascular: Negative. Skull: Intact and unremarkable. There is mild left forehead soft tissue swelling. Sinuses/Orbits: Clear paranasal sinuses and mastoid air cells. Status post bilateral lens replacement. Other: None. IMPRESSION: 1. Age-related atrophy and mild cerebral white matter disease. No evidence of acute intracranial injury. Electronically Signed   By: Evalene Coho M.D.   On: 09/14/2023 11:14   CT Angio Chest PE W and/or Wo Contrast Result Date: 08/28/2023 CLINICAL DATA:  Hypoxia.  History of lung cancer. EXAM: CT ANGIOGRAPHY CHEST WITH CONTRAST TECHNIQUE: Multidetector CT imaging of the chest was performed using the standard protocol during bolus administration of intravenous contrast. Multiplanar CT image reconstructions and MIPs were obtained to evaluate the vascular anatomy. RADIATION DOSE REDUCTION: This exam was performed according to the departmental dose-optimization program which includes automated exposure control, adjustment of the mA and/or kV according to patient size and/or use of iterative reconstruction technique. CONTRAST:  75mL OMNIPAQUE  IOHEXOL  350 MG/ML SOLN COMPARISON:  06/24/2023 FINDINGS: Cardiovascular: Left pacer in place with leads in the right atrium and right ventricle. Heart is normal size. Aorta is normal caliber. Coronary artery and aortic atherosclerosis. No filling defects in the pulmonary arteries to suggest pulmonary emboli. Mediastinum/Nodes: No mediastinal, hilar, or axillary adenopathy. Trachea and esophagus are unremarkable. Thyroid  unremarkable. Lungs/Pleura: Moderate centrilobular emphysema. Post radiation changes/scarring in the right mid lung, stable. Left lower lobe pulmonary  nodule measures up to 10 mm, stable since prior study. Lingular nodule measures 3 mm on image 92, stable. 2 mm right lower lobe pulmonary nodule on image 94 is stable. 5 mm medial left upper lobe nodule on image 48. In retrospect, this was present on prior study and is stable. 7 mm right upper lobe pulmonary nodule on image 56 is stable since prior study. No new or enlarging pulmonary nodules. Dependent linear densities in the lower lobes compatible with dependent atelectasis. No effusions. Upper Abdomen: No acute findings Musculoskeletal: Chest wall soft tissues are unremarkable. No acute bony abnormality. Review of the MIP images confirms the above findings. IMPRESSION: No evidence of pulmonary embolus. Post treatment changes in the right mid lung. Scattered bilateral pulmonary nodules are stable since prior study. Coronary artery disease. Aortic Atherosclerosis (ICD10-I70.0) and Emphysema (ICD10-J43.9). Electronically Signed   By: Franky Crease M.D.   On: 08/28/2023 18:52   DG Chest Portable 1 View Result Date: 08/28/2023 CLINICAL DATA:  Hypoxia, syncopal episode. EXAM: PORTABLE CHEST 1 VIEW COMPARISON:  08/26/2023, 07/12/2023. FINDINGS: The heart size and mediastinal contours are stable. There is atherosclerotic calcification of the aorta. Mild atelectasis is noted at the left lung base. There is stable linear scarring or atelectasis in the mid right lung. No effusion or pneumothorax is seen. Scattered nodular densities are noted in the lungs measuring up to 9 mm. A dual lead pacemaker device is present over the left chest. No acute osseous abnormality. IMPRESSION: 1. No active disease. 2. Mild atelectasis or scarring at the left lung base and mid right lung. 3. Scattered  pulmonary nodules, recently evaluated with PET-CT. Electronically Signed   By: Leita Birmingham M.D.   On: 08/28/2023 16:46   CT Head Wo Contrast Result Date: 08/28/2023 CLINICAL DATA:  Head trauma, syncopal episode 1 hour ago. EXAM: CT HEAD  WITHOUT CONTRAST TECHNIQUE: Contiguous axial images were obtained from the base of the skull through the vertex without intravenous contrast. RADIATION DOSE REDUCTION: This exam was performed according to the departmental dose-optimization program which includes automated exposure control, adjustment of the mA and/or kV according to patient size and/or use of iterative reconstruction technique. COMPARISON:  CT head 02/12/2022. FINDINGS: Brain: No acute intracranial hemorrhage. No CT evidence of acute infarct. Nonspecific hypoattenuation in the periventricular and subcortical white matter favored to reflect chronic microvascular ischemic changes. Remote lacunar infarct in the basal ganglia. No edema, mass effect, or midline shift. The basilar cisterns are patent. Ventricles: The ventricles are normal. Vascular: No hyperdense vessel or unexpected calcification. Skull: No acute or aggressive finding. Orbits: Orbits are symmetric. Sinuses: The visualized paranasal sinuses are clear. Other: Small hematoma in the left frontal scalp. Mastoid air cells are clear. IMPRESSION: No CT evidence of acute intracranial abnormality. Small left frontal scalp hematoma. Chronic microvascular ischemic changes. Remote lacunar infarct in the left basal ganglia. Electronically Signed   By: Donnice Mania M.D.   On: 08/28/2023 15:05   CT Cervical Spine Wo Contrast Result Date: 08/28/2023 CLINICAL DATA:  Neck trauma.  Syncopal episode. EXAM: CT CERVICAL SPINE WITHOUT CONTRAST TECHNIQUE: Multidetector CT imaging of the cervical spine was performed without intravenous contrast. Multiplanar CT image reconstructions were also generated. RADIATION DOSE REDUCTION: This exam was performed according to the departmental dose-optimization program which includes automated exposure control, adjustment of the mA and/or kV according to patient size and/or use of iterative reconstruction technique. COMPARISON:  Cervical spine radiographs 02/15/2020.  Cervical MRI 03/28/2020. FINDINGS: Alignment: Chronic anterolisthesis at C4-5, similar to prior imaging. Skull base and vertebrae: No evidence of acute cervical spine fracture or traumatic subluxation. Soft tissues and spinal canal: No prevertebral fluid or swelling. No visible canal hematoma. Disc levels: Chronic multilevel spondylosis with loss of disc height, uncinate spurring and facet hypertrophy. The spondylosis is most advanced at C5-6 and C6-7. No large disc herniation identified. Upper chest: Grossly stable 1.4 cm left thyroid  nodule for which no specific follow-up imaging is recommended. Clear lung apices. Other: Bilateral carotid atherosclerosis with right carotid stent. Advanced TMJ degenerative changes bilaterally. IMPRESSION: 1. No evidence of acute cervical spine fracture, traumatic subluxation or static signs of instability. 2. Chronic multilevel cervical spondylosis. Electronically Signed   By: Elsie Perone M.D.   On: 08/28/2023 15:03   DG Chest Port 1 View Result Date: 08/26/2023 CLINICAL DATA:  Provided history: Sepsis Low blood pressure and hypoxia.  History of lung cancer. EXAM: PORTABLE CHEST 1 VIEW COMPARISON:  Chest CT 06/24/2023 FINDINGS: Dual lead left-sided pacemaker in place. Upper normal heart size with stable mediastinal contours. Aortic atherosclerosis. Bandlike scarring in the right mid lung. Chronic hyperinflation and interstitial coarsening. The left lower lobe pulmonary nodule on CT is not well-defined by radiograph. No pneumothorax or pleural effusion. IMPRESSION: 1. Chronic hyperinflation and interstitial coarsening. No acute findings. 2. The left lower lobe pulmonary nodule on CT is not well-defined by radiograph. Electronically Signed   By: Andrea Gasman M.D.   On: 08/26/2023 14:08    Microbiology: Results for orders placed or performed during the hospital encounter of 08/28/23  Urine Culture (for pregnant, neutropenic or urologic patients or  patients with an  indwelling urinary catheter)     Status: Abnormal   Collection Time: 08/29/23  8:50 PM   Specimen: Urine, Clean Catch  Result Value Ref Range Status   Specimen Description   Final    URINE, CLEAN CATCH Performed at [facility], [address], [place], [place] [address]    Special Requests   Final    NONE Performed at [facility], [address] [place], [place] [address]    Culture 10,000 COLONIES/mL ESCHERICHIA COLI (A)  Final   Report Status 09/01/2023 FINAL  Final   Organism ID, Bacteria ESCHERICHIA COLI (A)  Final      Susceptibility   Escherichia coli - MIC*    AMPICILLIN  >=32 RESISTANT Resistant     CEFAZOLIN  8 SENSITIVE Sensitive     CEFEPIME <=0.12 SENSITIVE Sensitive     CEFTRIAXONE  <=0.25 SENSITIVE Sensitive     CIPROFLOXACIN <=0.25 SENSITIVE Sensitive     GENTAMICIN  <=1 SENSITIVE Sensitive     IMIPENEM <=0.25 SENSITIVE Sensitive     NITROFURANTOIN  <=16 SENSITIVE Sensitive     TRIMETH /SULFA  <=20 SENSITIVE Sensitive     AMPICILLIN /SULBACTAM >=32 RESISTANT Resistant     PIP/TAZO <=4 SENSITIVE Sensitive ug/mL    * 10,000 COLONIES/mL ESCHERICHIA COLI   *Note: Due to a large number of results and/or encounters for the requested time period, some results have not been displayed. A complete set of results can be found in Results Review.    Labs: CBC: Recent Labs  Lab 09/14/23 1128  WBC 8.1  NEUTROABS 6.0  HGB 11.2*  HCT 34.2*  MCV 90.5  PLT 426*   Basic Metabolic Panel: Recent Labs  Lab 09/14/23 1129  NA 137  K 3.6  CL 100  CO2 26  GLUCOSE 109*  BUN 21  CREATININE 0.92  CALCIUM  9.5   Liver Function Tests: Recent Labs  Lab 09/14/23 1129  AST 21  ALT 13  ALKPHOS 77  BILITOT 0.9  PROT 7.1  ALBUMIN 3.5   CBG: No results for input(s): GLUCAP in the last 168 hours.  Discharge time spent: more than 30 minutes.  Signed: Cort ONEIDA Mana, MD [facility] 09/14/2023

## 2023-09-14 NOTE — Telephone Encounter (Signed)
 FYI Only or Action Required?: FYI only for provider.  Patient was last seen in primary care on 05/13/2023 by Watt Mirza, MD.  Called Nurse Triage reporting Loss of Consciousness.  Symptoms began several days ago.  Interventions attempted: OTC medications: Tylenol ; lies patient flat after syncopal episodes and she improves.  Symptoms are: a dozen episodes of feeling hot,sweaty and dizzy followed by syncope unchanged.  Triage Disposition: Go to ED Now (Notify PCP)  Patient/caregiver understands and will follow disposition?: Yes               Copied from CRM [id number]. Topic: Clinical - Red Word Triage >> Sep 14, 2023  8:08 AM Rosaria A wrote: Red Word that prompted transfer to Nurse Triage: Patient husband Mr. Loretto states that the patient was discharged from the hospital last week. She was in the hospital for two weeks for blood pressure issues. She cannot stand up without passing out. Reason for Disposition  Fainted 2 times in one day  Answer Assessment - Initial Assessment Questions Husband on the phone for triage. He states the patient is doing well if she is flat in the recliner. He states the issue is when she has to stand up and get in the wheelchair and toilet her that she gets hot, sweaty and dizzy then passes out after standing up. July 4th patient went to ED and was hospitalized for 2 weeks. Husband states she is worse now than when she was hospitalized. Advised husband patient needs to go back to ED and if he can not safely get her to ED to call rescue.  1. ONSET: How long were you unconscious? (e.g., minutes, seconds) When did it happen?     Husband, Elsie, states this has happened about a dozen times since coming home Friday (09/11/23). He states it last a minute to few minutes.  2. CONTENT: What happened during the period of unconsciousness? (e.g., seizure activity)      Husband states it does not appear to be any seizure like activity.  3. MENTAL  STATUS: Alert and oriented now? (e.g., oriented x 3 = name, month, location)      He states she shows no signs of confusion.  4. TRIGGER: What do you think caused the fainting? What were you doing just before you fainted?  (e.g., exercise, sudden standing up, prolonged standing)     Husband states as soon as the patient has to stand up, she gets real dizzy and sweaty. When she tries to stand up to get in the wheelchair, she will pass out. He has to lie her flat   5. RECURRENT SYMPTOM: Have you ever passed out before? If Yes, ask: When was the last time? and What happened that time?      The last week of June, he states she passed out and fell multiple times.  6. INJURY: Did you hurt yourself when you fell?      No.  7. CARDIAC SYMPTOMS: Have you had any of the following symptoms: chest pain, difficulty breathing, palpitations?     He denies chest pain or difficulty breathing. He is unsure about palpitations.  8. NEUROLOGIC SYMPTOMS: Have you had any of the following symptoms: headache, numbness, vertigo, weakness?     Headache woke up with it this morning (treated with Tylenol ). Denies unilateral numbness or weakness, facial droop, changes in speech or vision.  9. GI SYMPTOMS: Have you had any of the following symptoms: abdomen pain, vomiting, diarrhea, blood in stools?  Denies nausea, vomiting, diarrhea, blood in stool.  10. OTHER SYMPTOMS: Do you have any other symptoms?       Poor appetite.  11. PREGNANCY: Is there any chance you are pregnant? When was your last menstrual period?       N/A.  Protocols used: [employer]

## 2023-09-14 NOTE — Telephone Encounter (Signed)
 Agree with advised to go to ER Aware, will watch for correspondence

## 2023-09-14 NOTE — Telephone Encounter (Signed)
 Copied from CRM [id number]. Topic: General - Other >> Sep 14, 2023  9:40 AM Donna BRAVO wrote: Reason for CRM: I am unable to send a note over to patient Dr Elspeth Sage Cardiologist, stating husband is taking patient to [facility] ED.  Please send a note over to Dr Elspeth Sage  office.

## 2023-09-15 ENCOUNTER — Ambulatory Visit: Payer: Self-pay | Admitting: Cardiology

## 2023-09-16 ENCOUNTER — Telehealth: Payer: Self-pay | Admitting: *Deleted

## 2023-09-16 NOTE — Telephone Encounter (Signed)
 Would someone get more info on this ?  Thanks

## 2023-09-16 NOTE — Telephone Encounter (Signed)
 Copied from CRM #[id number]. Topic: Referral - Question >> Sep 16, 2023  1:50 PM [place] wrote: Reason for CRM: Patient husband looking to get a referral for [facility]

## 2023-09-17 ENCOUNTER — Telehealth: Payer: Self-pay | Admitting: Family Medicine

## 2023-09-17 ENCOUNTER — Ambulatory Visit: Payer: Self-pay

## 2023-09-17 NOTE — Telephone Encounter (Signed)
 Do you do injections or do we need to call and set up with Dr. Watt?

## 2023-09-17 NOTE — Telephone Encounter (Signed)
 Left message to return call to our office.

## 2023-09-17 NOTE — Telephone Encounter (Signed)
 Spoke to patient and wife they are aware that they will not be getting injection in office tomorrow. Do want to keep this as hospital follow up. Not able to set up with Dr. Watt he is out all week next week. Did you want me to send to another office? Will talk to you about at office visit.

## 2023-09-17 NOTE — Telephone Encounter (Signed)
 Copied from CRM [id number]. Topic: Clinical - Medical Advice >> Sep 17, 2023  4:47 PM Charolett L wrote: Reason for CRM: Dorthea from [facility] home [facility] Stated that patient has declined Home health visits until she sees Dr. Randeen on 07/25 and if that the case she wouldn't be able to be seen til 07/29

## 2023-09-17 NOTE — Telephone Encounter (Signed)
 I assume there will be more to discuss for hospital follow up given previous message Thanks

## 2023-09-17 NOTE — Telephone Encounter (Signed)
 FYI Only or Action Required?: FYI only for provider.  Patient was last seen in primary care on 05/13/2023 by Watt Mirza, MD.  Called Nurse Triage reporting Joint Swelling.  Symptoms began a week ago.  Interventions attempted: Nothing.  Symptoms are: unchanged. Pt. Has appointment tomorrow. Asking to have a cortisone injection when I come in.  Triage Disposition: See Physician Within 24 Hours  Patient/caregiver understands and will follow disposition?: Yes    Copied from CRM #[id number]. Topic: Clinical - Red Word Triage >> Sep 17, 2023 10:11 AM Diana Cook wrote: Reason for CRM: knee has been swollen since tuesday CB [phone] Reason for Disposition  SEVERE swelling (e.g., can't move swollen knee at all)  Answer Assessment - Initial Assessment Questions 1. LOCATION: Where is the swelling located?  (e.g., left, right, both knees)     Right 2. ONSET: When did the swelling start? Does it come and go, or is it there all the time?     1 week 3. SWELLING: How bad is the swelling? Or, How large is it? (e.g., mild, moderate, severe; size of localized swelling)      moderate 4. PAIN: Is there any pain? If Yes, ask: How bad is it? (Scale 0-10; or none, mild, moderate, severe)     severe 5. SETTING: Has there been any recent work, exercise or other activity that involved that part of the body?      no 6. AGGRAVATING FACTORS: What makes the knee swelling worse? (e.g., walking, climbing stairs, running)     Walking 7. ASSOCIATED SYMPTOMS: Is there any pain or redness?     pain 8. OTHER SYMPTOMS: Do you have any other symptoms? (e.g., calf pain, chest pain, difficulty breathing, fever)     no 9. PREGNANCY: Is there any chance you are pregnant? When was your last menstrual period?     no  Protocols used: Knee Swelling-A-AH

## 2023-09-17 NOTE — Telephone Encounter (Signed)
 Husband is wanting to get information on having placed in rehab. This was advised by [facility] to call pcp for orders. Advised that you would review with him at appointment tomorrow.

## 2023-09-17 NOTE — Telephone Encounter (Signed)
 I do NOT do injections Thanks for the heads up

## 2023-09-17 NOTE — Telephone Encounter (Signed)
 I can't admit her to a rehab facility from home (it has to be straight from the hospital)  I can order home health for PT   If they want to discuss more long term care , will discuss   Thanks

## 2023-09-18 ENCOUNTER — Ambulatory Visit: Admitting: Family Medicine

## 2023-09-18 ENCOUNTER — Encounter: Payer: Self-pay | Admitting: Family Medicine

## 2023-09-18 VITALS — BP 136/82 | HR 72 | Temp 97.9°F

## 2023-09-18 DIAGNOSIS — J432 Centrilobular emphysema: Secondary | ICD-10-CM

## 2023-09-18 DIAGNOSIS — I951 Orthostatic hypotension: Secondary | ICD-10-CM

## 2023-09-18 DIAGNOSIS — R5381 Other malaise: Secondary | ICD-10-CM

## 2023-09-18 DIAGNOSIS — I5032 Chronic diastolic (congestive) heart failure: Secondary | ICD-10-CM

## 2023-09-18 DIAGNOSIS — I959 Hypotension, unspecified: Secondary | ICD-10-CM

## 2023-09-18 DIAGNOSIS — R944 Abnormal results of kidney function studies: Secondary | ICD-10-CM

## 2023-09-18 NOTE — Assessment & Plan Note (Signed)
 Care not to fluid overload  Difficulty supporting blood pressure   Adding to lack of mobility and loss of independence

## 2023-09-18 NOTE — Assessment & Plan Note (Signed)
 Ongoing  S/p hospitalization and ER visits Reviewed hospital records, lab results and studies in detail   No cause found thus far , no frank dehydration , electrolyte imbalance, adrenal insuff  Cannot over hydrate due ot CHD On minodrine 10 mg tid which does not work on its own  Fludrocortisone  added at last hospitalization 0.1 mg daily  Pt notes today is slightly better but she still cannot stand with dizziness/ syncope  Has follow up with cardiology  Dr End in a month (would like to get in earlier if possible) May need neuro appointment if this cannot be rectified  PT to start at home soon for strength (has not walked in 8 weeks)  Urged great caution with sedating medicines  Call back and Er precautions noted in detail today   Plan to refer to community care Pt is interested in change in living situation as she is not mobile and cannot care for herself  Referral done

## 2023-09-18 NOTE — Telephone Encounter (Signed)
 See separate message regarding PT

## 2023-09-18 NOTE — Progress Notes (Signed)
 Subjective:    Patient ID: Diana Cook, female    DOB: 07-Aug-1950, 73 y.o.   MRN: [id number]  HPI  Wt Readings from Last 3 Encounters:  09/14/23 205 lb 9.6 oz (93.3 kg)  08/28/23 200 lb (90.7 kg)  08/26/23 209 lb 7 oz (95 kg)      Vitals:   09/18/23 1212  BP: 136/82  Pulse: 72  Temp: 97.9 F (36.6 C)  SpO2: 91%     Pt presents for follow up of hospitalization for orthostasis and syncope  Midodrine  was ineffective  Steroid medicine  Right/o dehydration , adrenal insuff, electrolyte imbalance, MI and anemia  Noted reduced oral intake and difficulty with fluid intake   Noted needs PT  Cardiology recommended 3 left fluids daily  Abd binder Supp hose  Re start fludrocorisone 0.1 mg bid and K   Pacer was interrogated and normal   History of  A fib CHF Copd  CAD Stable aortic stenosis   Has PT appointment 7/30 Cardiology follow up 8/27 Nephrology in nov    Overall still struggling  Today is a better day (best day so far)   Has been over 8 weeks since she walked   Working hydration  Cannot have too much due to CHF   Cannot do anything on her own  Son is living with her currently     Lab Results  Component Value Date   NA 137 09/14/2023   K 3.6 09/14/2023   CO2 26 09/14/2023   GLUCOSE 109 (H) 09/14/2023   BUN 21 09/14/2023   CREATININE 0.92 09/14/2023   CALCIUM  9.5 09/14/2023   GFR 47.33 (L) 05/05/2023   GFRNONAA >60 09/14/2023  GFR was 52 on same day at [facility] Lab Results  Component Value Date   ALT 13 09/14/2023   AST 21 09/14/2023   ALKPHOS 77 09/14/2023   BILITOT 0.9 09/14/2023   Lab Results  Component Value Date   WBC 8.1 09/14/2023   HGB 11.2 (L) 09/14/2023   HCT 34.2 (L) 09/14/2023   MCV 90.5 09/14/2023   PLT 426 (H) 09/14/2023  Hb 12.0 same day at [facility]   Lab Results  Component Value Date   TSH 0.783 08/29/2023   Has had a lot of knee pain from sitting in wheelchair  Right knee especially  Was interested in cortisone  shot     Patient Active Problem List   Diagnosis Date Noted   Physical deconditioning 09/18/2023   Syncope 08/29/2023   Orthostatic hypotension 08/28/2023   Decreased GFR 04/29/2023   Presence of heart assist device (HCC) 03/25/2023   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/25/2023   Persistent cough 03/03/2023   Urinary urgency 02/09/2023   Bilateral carotid artery stenosis 04/29/2022   Carotid stenosis, symptomatic w/o infarct 03/23/2022   Right shoulder pain 11/25/2021   Mild anemia 11/03/2021   Thyroid  nodule 09/17/2021   Dyslipidemia 09/05/2021   Depression 09/05/2021   GERD without esophagitis 09/05/2021   Vitamin D  deficiency 08/20/2021   Colon cancer screening 07/30/2021   Hypercalcemia 07/29/2021   UTI (urinary tract infection) 07/17/2021   Dysuria 07/08/2021   Arterial hypotension 07/08/2021   Paroxysmal atrial fibrillation (HCC) 04/11/2021   Current use of proton pump inhibitor 10/24/2020   Sleep apnea 10/24/2020   Fatigue 10/24/2020   Lightheadedness 05/16/2020   Radicular pain in left arm 03/02/2020   Neck pain on left side 02/15/2020   Paresthesia 02/15/2020   Chronic respiratory failure with hypoxia (  HCC) 11/17/2019   Chronic heart failure with preserved ejection fraction (HFpEF) (HCC) 08/11/2019   Coronary artery disease involving native coronary artery of native heart without angina pectoris 06/20/2019   Gastroesophageal reflux disease    Stomach irritation    Abnormal CT scan, esophagus    Polyp of ascending colon    Personal history of tobacco use, presenting hazards to health 01/18/2018   Grade I diastolic dysfunction 12/21/2017   Globus pharyngeus 12/21/2017   Chronic obstructive pulmonary disease (HCC) 12/15/2017   Hyperlipidemia 12/07/2017   Seizures (HCC) 10/01/2016   Elevated glucose 08/31/2016   Grief reaction 04/11/2016   History of breast cancer 12/11/2015   Osteoporosis 11/22/2015   Estrogen deficiency 09/07/2015   Wrinkles  09/07/2015   Post herpetic neuralgia 09/01/2014   Encounter for Medicare annual wellness exam 07/11/2014   Routine general medical examination at a health care facility 06/26/2013   H/O small bowel obstruction 04/15/2013   SOB (shortness of breath) 02/10/2013   Complete heart block (HCC) 11/03/2011   Pacemaker-[employer] 11/03/2011   Lumbar spinal stenosis 09/02/2011   POSTMENOPAUSAL STATUS 08/06/2009   HIDRADENITIS SUPPURATIVA 06/26/2008   HERNIATED DISC 12/14/2007   BACK, LOWER, PAIN 12/02/2007   Hyperkalemia 08/06/2007   History of colonic polyps 09/14/2006   Former smoker 06/10/2006   Asthma 06/10/2006   H/O idiopathic seizure 06/10/2006   Past Medical History:  Diagnosis Date   Allergic rhinitis    Arthritis    Asthma    as a child, mild now   Breast cancer (HCC) 12/2015   right breast cancer, lumpectomy and mammosite    Colon polyps    colonoscopy 7/08, tubular adenoma   Complete heart block (HCC) 09/2011   s/p PPM implanted in [facility]   COPD (chronic obstructive pulmonary disease) (HCC)    Myocardial infarction (HCC) 2011   Osteopenia 10/2015   Pacemaker    2011   Paroxysmal atrial fibrillation (HCC) 03/2021   Incidentally detected on pacemaker interrogation   Personal history of radiation therapy 2017   right breast ca, mammosite placed   Rotator cuff tear, right 02/2022   Had an MRI in [place] I have 2 tears.   Seizure disorder (HCC)    Seizures (HCC)    first one was when she was 73 years old    Small bowel obstruction (HCC)    1988 and 2002   Tobacco abuse    Past Surgical History:  Procedure Laterality Date   ABDOMINAL HYSTERECTOMY     BREAST BIOPSY Right 2007   benign inflammatory changes, mass due to underwire bra   BREAST BIOPSY Left 01/02/2016   columnar cell changes without atypical hyperplasia.   BREAST BIOPSY Right 12/06/2015   rt breast mass 10:00, bx done at Dr. Gilberto office, invasive ductal carcinoma   BREAST EXCISIONAL  BIOPSY Left 01/02/2016   COLUMNAR CELL CHANGE AND HYPERPLASIA ASSOCIATED WITH LUMINAL AND STROMAL CALCIFICATIONS   BREAST LUMPECTOMY Right 01/02/2016   invasive mammary carcinoma, clear margins, negative LN   BREAST LUMPECTOMY WITH SENTINEL LYMPH NODE BIOPSY Right 01/02/2016   pT1c, N0; ER/ PR 100%; Her 2 neu not over expressed: BREAST LUMPECTOMY WITH SENTINEL LYMPH NODE BX;  Surgeon: Reyes LELON Cota, MD;  Location: [facility] ORS;  Service: General;  Laterality: Right;   CARDIAC CATHETERIZATION     CAROTID PTA/STENT INTERVENTION Right 04/29/2022   Procedure: CAROTID PTA/STENT INTERVENTION;  Surgeon: Jama Cordella MATSU, MD;  Location: [facility] INVASIVE CV LAB;  Service: Cardiovascular;  Laterality: Right;   CATARACT EXTRACTION Bilateral    COLONOSCOPY  10/2015   Dr Teressa   COLONOSCOPY WITH PROPOFOL  N/A 08/19/2018   Procedure: COLONOSCOPY WITH PROPOFOL ;  Surgeon: Janalyn Keene NOVAK, MD;  Location: [facility] ENDOSCOPY;  Service: Endoscopy;  Laterality: N/A;   ESOPHAGOGASTRODUODENOSCOPY (EGD) WITH PROPOFOL  N/A 08/19/2018   Procedure: ESOPHAGOGASTRODUODENOSCOPY (EGD) WITH PROPOFOL ;  Surgeon: Janalyn Keene NOVAK, MD;  Location: [facility] ENDOSCOPY;  Service: Endoscopy;  Laterality: N/A;   EXPLORATORY LAPAROTOMY  01/25/2001   Exploratory laparotomy, lysis of adhesions, identification of internal hernia secondary to omental adhesion. Prolonged postoperative ileus.   gyn surgery  1993   hysterectomy- form endometriosis   LAPAROSCOPY     PACEMAKER INSERTION  10/24/11   [employer] Advantio dual chamber PPM implanted by Dr Ronny at [facility] in [place]   PPM GENERATOR CHANGEOUT N/A 12/05/2019   Procedure: PPM GENERATOR HERMA;  Surgeon: Fernande Elspeth BROCKS, MD;  Location: [facility] INVASIVE CV LAB;  Service: Cardiovascular;  Laterality: N/A;   RIGHT/LEFT HEART CATH AND CORONARY ANGIOGRAPHY Bilateral 07/19/2019   Procedure: RIGHT/LEFT HEART CATH AND CORONARY ANGIOGRAPHY;  Surgeon: Mady Bruckner,  MD;  Location: [facility] INVASIVE CV LAB;  Service: Cardiovascular;  Laterality: Bilateral;   TEMPORARY PACEMAKER N/A 12/05/2019   Procedure: TEMPORARY PACEMAKER;  Surgeon: Fernande Elspeth BROCKS, MD;  Location: [facility] INVASIVE CV LAB;  Service: Cardiovascular;  Laterality: N/A;   Social History   Tobacco Use   Smoking status: Former    Current packs/day: 0.00    Average packs/day: 1.5 packs/day for 42.0 years (63.0 ttl pk-yrs)    Types: Cigarettes    Start date: 11/10/1968    Quit date: 11/11/2010    Years since quitting: 12.8   Smokeless tobacco: Never  Vaping Use   Vaping status: Never Used  Substance Use Topics   Alcohol use: Yes    Alcohol/week: 1.0 - 2.0 standard drink of alcohol    Types: 1 - 2 Glasses of wine per week    Comment: weekly   Drug use: No   Family History  Problem Relation Age of Onset   Stroke Mother    Heart disease Mother 60       MI and CABG   Dementia Mother    Coronary artery disease Father    Parkinsonism Father    Breast cancer Sister    Cancer - Cervical Daughter 100       died 2023/04/06   Heart attack Brother 65   Colon cancer Neg Hx    Allergies  Allergen Reactions   Codeine Nausea And Vomiting   Morphine  And Codeine Nausea Only   Current Outpatient Medications on File Prior to Visit  Medication Sig Dispense Refill   acetaminophen  (TYLENOL ) 500 MG tablet Take 500-1,000 mg by mouth every 6 (six) hours as needed for mild pain or headache.      albuterol  (VENTOLIN  HFA) 108 (90 Base) MCG/ACT inhaler INHALE 2 PUFFS BY MOUTH EVERY 4 HOURS AS NEEDED FOR WHEEZING OR SHORTNESS OF BREATH 18 g 11   apixaban  (ELIQUIS ) 5 MG TABS tablet Take 1 tablet (5 mg total) by mouth 2 (two) times daily.     aspirin  EC 81 MG tablet Take 1 tablet (81 mg total) by mouth daily. 150 tablet 2   atorvastatin  (LIPITOR) 40 MG tablet Take 1 tablet by mouth once daily 90 tablet 2   benzonatate  (TESSALON ) 200 MG capsule Take 1 capsule (200 mg total) by mouth 2 (two) times daily as  needed for  cough. 20 capsule 0   Cholecalciferol  (VITAMIN D3) 50 MCG (2000 UT) capsule Take 2,000 Units by mouth daily.     fludrocortisone  (FLORINEF ) 0.1 MG tablet Take 0.1 mg by mouth daily.     FLUoxetine  (PROZAC ) 20 MG capsule Take 1 capsule by mouth once daily 90 capsule 2   gabapentin  (NEURONTIN ) 100 MG capsule Take 200 mg by mouth 2 (two) times daily as needed.     levETIRAcetam  (KEPPRA ) 250 MG tablet Take 250 mg by mouth 2 (two) times daily.      midodrine  (PROAMATINE ) 10 MG tablet Take 1 tablet (10 mg total) by mouth 3 (three) times daily with meals. 90 tablet 2   OXYGEN  Inhale 4 L into the lungs continuous.     pantoprazole  (PROTONIX ) 40 MG tablet Take 1 tablet (40 mg total) by mouth daily. 90 tablet 3   potassium chloride  (KLOR-CON  M) 10 MEQ tablet Take 10 mEq by mouth daily.     Tiotropium Bromide-Olodaterol (STIOLTO RESPIMAT ) 2.5-2.5 MCG/ACT AERS INHALE 2 PUFFS BY MOUTH ONCE DAILY 12 g 3   zolpidem  (AMBIEN ) 10 MG tablet TAKE 1/2 TO 1 (ONE-HALF TO ONE) TABLET BY MOUTH AT BEDTIME AS NEEDED FOR SLEEP 30 tablet 3   No current facility-administered medications on file prior to visit.    Review of Systems  Constitutional:  Positive for fatigue. Negative for activity change, appetite change, fever and unexpected weight change.  HENT:  Negative for congestion, ear pain, rhinorrhea, sinus pressure and sore throat.   Eyes:  Negative for pain, redness and visual disturbance.  Respiratory:  Positive for shortness of breath. Negative for cough and wheezing.   Cardiovascular:  Negative for chest pain and palpitations.  Gastrointestinal:  Negative for abdominal pain, blood in stool, constipation and diarrhea.  Endocrine: Negative for polydipsia and polyuria.  Genitourinary:  Negative for dysuria, frequency and urgency.  Musculoskeletal:  Positive for arthralgias. Negative for back pain and myalgias.  Skin:  Negative for pallor and rash.  Allergic/Immunologic: Negative for environmental allergies.   Neurological:  Positive for syncope, weakness and light-headedness. Negative for dizziness, facial asymmetry and headaches.  Hematological:  Negative for adenopathy. Does not bruise/bleed easily.  Psychiatric/Behavioral:  Positive for dysphoric mood. Negative for decreased concentration. The patient is not nervous/anxious.        Objective:   Physical Exam Constitutional:      General: She is not in acute distress.    Appearance: Normal appearance. She is well-developed. She is obese. She is not ill-appearing or diaphoretic.     Comments: Wheelchair bound  Unable to stand  Generally weak and frail   HENT:     Head: Normocephalic and atraumatic.     Mouth/Throat:     Mouth: Mucous membranes are moist.  Eyes:     General: No scleral icterus.       Right eye: No discharge.        Left eye: No discharge.     Conjunctiva/sclera: Conjunctivae normal.     Pupils: Pupils are equal, round, and reactive to light.  Neck:     Thyroid : No thyromegaly.     Vascular: No carotid bruit or JVD.  Cardiovascular:     Rate and Rhythm: Normal rate.     Heart sounds: Normal heart sounds.     No gallop.  Pulmonary:     Effort: Pulmonary effort is normal. No respiratory distress.     Breath sounds: Normal breath sounds. No stridor. No wheezing,  rhonchi or rales.  Abdominal:     General: There is no distension or abdominal bruit.     Palpations: Abdomen is soft. There is no mass.     Tenderness: There is no abdominal tenderness.  Musculoskeletal:     Cervical back: Normal range of motion and neck supple.     Right lower leg: No edema.     Left lower leg: No edema.  Lymphadenopathy:     Cervical: No cervical adenopathy.  Skin:    General: Skin is warm and dry.     Coloration: Skin is not pale.     Findings: No bruising or rash.  Neurological:     Mental Status: She is alert.     Cranial Nerves: No cranial nerve deficit.     Motor: Weakness present.     Coordination: Coordination normal.      Gait: Gait normal.     Deep Tendon Reflexes: Reflexes are normal and symmetric. Reflexes normal.     Comments: Generalized weakness Cannot stand    Psychiatric:        Mood and Affect: Mood normal.     Comments: Pleasant  Candidly discusses symptoms and stressors             Assessment & Plan:   Problem List Items Addressed This Visit       Cardiovascular and Mediastinum   Orthostatic hypotension - Primary   Ongoing  S/p hospitalization and ER visits Reviewed hospital records, lab results and studies in detail   No cause found thus far , no frank dehydration , electrolyte imbalance, adrenal insuff  Cannot over hydrate due ot CHD On minodrine 10 mg tid which does not work on its own  Fludrocortisone  added at last hospitalization 0.1 mg daily  Pt notes today is slightly better but she still cannot stand with dizziness/ syncope  Has follow up with cardiology  Dr End in a month (would like to get in earlier if possible) May need neuro appointment if this cannot be rectified  PT to start at home soon for strength (has not walked in 8 weeks)  Urged great caution with sedating medicines  Call back and Er precautions noted in detail today   Plan to refer to community care Pt is interested in change in living situation as she is not mobile and cannot care for herself  Referral done        Relevant Orders   AMB Referral [facility] Care Management   Chronic heart failure with preserved ejection fraction (HFpEF) (HCC)   Care not to fluid overload  Difficulty supporting blood pressure   Adding to lack of mobility and loss of independence       Relevant Orders   AMB Referral [facility] Care Management   Arterial hypotension   See a/p for orthostatic hypotension       Relevant Orders   AMB Referral [facility] Care Management     Respiratory   Chronic obstructive pulmonary disease (HCC)   Adds to her inability to care for herself       Relevant Medications   fludrocortisone   (FLORINEF ) 0.1 MG tablet   Other Relevant Orders   AMB Referral [facility] Care Management     Other   Physical deconditioning   S/p hospitalization  Lack of mobility due to orthostatic hypotension - cannot walk on own and has lost muscle mass / strength and stamina  HH PT was ordered in hospital  Will start hopefully Monday  Pt  will have to begin with chair exercise since she is unable to stand       Relevant Orders   AMB Referral [facility] Care Management   Decreased GFR   Recent GFR is improved from prior  Cannot hydrate as fully as optimal due to CHF and avoidance of fluid overload  No urinary symptoms  Has nephrologist following

## 2023-09-18 NOTE — Assessment & Plan Note (Signed)
 Adds to her inability to care for herself

## 2023-09-18 NOTE — Assessment & Plan Note (Signed)
 Recent GFR is improved from prior  Cannot hydrate as fully as optimal due to CHF and avoidance of fluid overload  No urinary symptoms  Has nephrologist following

## 2023-09-18 NOTE — Assessment & Plan Note (Signed)
 See a/p for orthostatic hypotension

## 2023-09-18 NOTE — Assessment & Plan Note (Signed)
 S/p hospitalization  Lack of mobility due to orthostatic hypotension - cannot walk on own and has lost muscle mass / strength and stamina  HH PT was ordered in hospital  Will start hopefully Monday  Pt will have to begin with chair exercise since she is unable to stand

## 2023-09-18 NOTE — Telephone Encounter (Signed)
 I am aware Discussed this with pt today  Please proceed with home care now - starting whenever you can Thanks

## 2023-09-18 NOTE — Patient Instructions (Addendum)
 See cardiology as planned I will send this note to Dr End   I will place referral for community care   Minimize the sleep medicine as much as possible   If you don't hear back from home care, let us  know    Keep up your fluids  Watch for shortness of breath or swelling (fluid overload)

## 2023-09-21 NOTE — Telephone Encounter (Signed)
 Advised Cindy with [facility] of Dr. Elsworth message. Projected start date of 09/21/23

## 2023-09-23 ENCOUNTER — Ambulatory Visit: Attending: Medical | Admitting: Medical

## 2023-09-23 ENCOUNTER — Telehealth: Payer: Self-pay | Admitting: Family Medicine

## 2023-09-23 ENCOUNTER — Encounter: Payer: Self-pay | Admitting: Medical

## 2023-09-23 ENCOUNTER — Telehealth: Payer: Self-pay

## 2023-09-23 VITALS — BP 153/80 | HR 67 | Resp 19 | Ht 68.0 in | Wt 202.0 lb

## 2023-09-23 DIAGNOSIS — J9611 Chronic respiratory failure with hypoxia: Secondary | ICD-10-CM

## 2023-09-23 DIAGNOSIS — I951 Orthostatic hypotension: Secondary | ICD-10-CM

## 2023-09-23 DIAGNOSIS — I2510 Atherosclerotic heart disease of native coronary artery without angina pectoris: Secondary | ICD-10-CM | POA: Diagnosis not present

## 2023-09-23 DIAGNOSIS — I6523 Occlusion and stenosis of bilateral carotid arteries: Secondary | ICD-10-CM | POA: Diagnosis not present

## 2023-09-23 DIAGNOSIS — I442 Atrioventricular block, complete: Secondary | ICD-10-CM

## 2023-09-23 DIAGNOSIS — I480 Paroxysmal atrial fibrillation: Secondary | ICD-10-CM

## 2023-09-23 MED ORDER — FLUDROCORTISONE ACETATE 0.1 MG PO TABS: 0.2000 mg | ORAL_TABLET | Freq: Two times a day (BID) | ORAL | 3 refills | Status: DC

## 2023-09-23 NOTE — Progress Notes (Signed)
 Cardiology Office Note   Date:  09/23/2023  ID:  Diana Cook, DOB 1950-12-17, MRN [id number] PCP: Randeen Laine LABOR, MD  [facility] Providers Cardiologist:  Lonni Hanson, MD   History of Present Illness Diana Cook is a 73 y.o. female with a history of moderate coronary artery disease by cardiac CTA in 11-2018 and cardiac cath in 06/2019, carotid artery stenosis status post right carotid stenting 04/2022, complete heart block status post permanent pacemaker followed by Dr. Fernande, paroxysmal A-fib, seizure disorder, COPD, and right breast cancer who presents for follow-up of CAD and atrial fibrillation.  Patient was last seen 06/2023 reporting weight gain, fatigue, intermittent confusion.  She felt it was Eliquis , but after discussion it was decided symptoms started before medication. Echo showed LVEF 55-60%, no WMA, G1DD.   The patient was admitted early July 2025 with syncope from orthostatic hypotension, UTI, hypokalemia, hypomagnesemia. Patient had diarrhea for the week beforehand. Cortisol elvels were normal. Patient was started on midodrine  and florinef  and given IVF. It was later felt Florinef  did not help and it was discontinued. Patient was discharged on midodrine  10mg  TID and recommended wearing abdominal binder, TED hose and increased oral hydration.  She was seen in the ER 09/14/23 for orthostatic hypotension and syncope. She was restarted on Florinef .    Today, she denies further syncope, but continues to have orthostatic hypotensive symptoms. Today, orthostatics are positive. She feels dizziness and lightheadedness every time she stands up and is unable to walk without help. She just started PT and OT, this will be for 2 months.  She is on 4L O2, which is her baseline. She has infrequent chest pain episodes from scar tissue since breast surgery. She is wearing compression stockings and abdominal binder. She takes lasix  as needed for swelling.    Studies Reviewed EKG  Interpretation Date/Time:  Wednesday September 23 2023 14:35:44 EDT Ventricular Rate:  63 PR Interval:    QRS Duration:  168 QT Interval:  488 QTC Calculation: 499 R Axis:   -67  Text Interpretation: Ventricular-paced rhythm When compared with ECG of 14-Sep-2023 10:14, PREVIOUS ECG IS PRESENT Confirmed by Franchester, Zeffie Bickert ([id number]) on 09/23/2023 2:51:48 PM    Echo 07/2023 1. Left ventricular ejection fraction, by estimation, is 55 to 60%. Left  ventricular ejection fraction by 2D MOD biplane is 56.1 %. The left  ventricle has normal function. The left ventricle has no regional wall  motion abnormalities. Left ventricular  diastolic parameters are consistent with Grade I diastolic dysfunction  (impaired relaxation).   2. Right ventricular systolic function is normal. The right ventricular  size is normal.   3. The mitral valve is normal in structure. Trivial mitral valve  regurgitation.   4. The aortic valve is tricuspid. Aortic valve regurgitation is not  visualized.   Carotid US  05/2023    Vertebrals:  Bilateral vertebral arteries demonstrate antegrade flow.  Subclavians: Normal flow hemodynamics were seen in bilateral subclavian               arteries.   *See table(s) above for measurements and observations.         Physical Exam VS:  BP (!) 153/80 (BP Location: Left Arm, Patient Position: Sitting, Cuff Size: Normal)   Pulse 67   Resp 19   Ht 5' 8 (1.727 m)   Wt 202 lb (91.6 kg)   SpO2 90%   PF (!) 4 L/min   BMI 30.71 kg/m   Orthostatic VS for the past  24 hrs (Last 3 readings):  BP- Lying Pulse- Lying BP- Sitting Pulse- Sitting BP- Standing at 0 minutes Pulse- Standing at 0 minutes BP- Standing at 3 minutes Pulse- Standing at 3 minutes  09/23/23 1400 179/84 67 153/80 67 (!) 84/45 76 (!) 88/55 83      Wt Readings from Last 3 Encounters:  09/23/23 202 lb (91.6 kg)  09/14/23 205 lb 9.6 oz (93.3 kg)  08/28/23 200 lb (90.7 kg)    GEN: Well nourished, well developed in no  acute distress NECK: No JVD; No carotid bruits CARDIAC: RRR, no murmurs, rubs, gallops RESPIRATORY:  Clear to auscultation without rales, wheezing or rhonchi  ABDOMEN: Soft, non-tender, non-distended EXTREMITIES:  No edema; No deformity   ASSESSMENT AND PLAN  Orthostatic hypotension Orthostatics positive today (sitting 153/80>standing 84/45). She reports persistent symptoms to the point where she can only site and lay flat. She recently started PT and OT. She is on midodrine  10mg  TID and Florinef  0.1mg  BID. She uses an abdominal binder and compression socks. She stays hydrated as best she can and liberalizes salt intake. I will increase Florinef  to 0.2mg BID. if symptoms continue may need to try Pyridostigmine.   CAD Prior Cardiac CTA and subsequent catheterization showed moderate nonobstructive CAD. Continue Lipitor. She is on both ASA and Eliquis , preference by [facility].   Carotid artery stenosis S/p right carotid stent. Recent US  showed normal blood flow. Continue ASA and Eliquis  per [facility] preference. Continue statin therapy.   Chronic respiratory failure on 4L O2 This is multifactorial due to HFpEF, lung disease fatigue. She is stable on baseline O2. Recent echo showed normal LVEF, G1DD, no significant valve disease. She takes lasix  as needed for lower leg edema.   Paroxysmal Afib CHB s/p PPM Most recent device check showed A sensed V paced. EKG reflects this as well. She reports device was adjusted at [facility]. We will have her see EP for further management. Continue Eliquis  for stroke ppx.         Dispo: Follow-up in 1 month with MD  Signed, Geron Mulford VEAR Fishman, PA-C

## 2023-09-23 NOTE — Telephone Encounter (Signed)
Please ok that verbal order  

## 2023-09-23 NOTE — Telephone Encounter (Signed)
 Copied from CRM [id number]. Topic: Clinical - Home Health Verbal Orders >> Sep 22, 2023  4:11 PM Tiffany S wrote: Caller/Agency: [facility] Callback Number: [phone] Service Requested: Physical Therapy Frequency: 1 week 9  Any new concerns about the patient? No

## 2023-09-23 NOTE — Progress Notes (Signed)
 Complex Care Management Note Care Guide Note  09/23/2023 Name: Diana Cook MRN: [id number] DOB: Aug 08, 1950   Complex Care Management Outreach Attempts: An unsuccessful telephone outreach was attempted today to offer the patient information about available complex care management services.  Follow Up Plan:  Additional outreach attempts will be made to offer the patient complex care management information and services.   Encounter Outcome:  Patient Request to Call Back  [facility]  [facility], [facility] Health Care Management Assistant Direct Dial: [phone]  Fax: [phone]

## 2023-09-23 NOTE — Patient Instructions (Addendum)
 Medication Instructions:  Your physician recommends the following medication changes.  INCREASE: Flornief to 2 tablets (0.2 mg) by mouth twice a day  *If you need a refill on your cardiac medications before your next appointment, please call your pharmacy*  Lab Work: No labs ordered today    Testing/Procedures: No test ordered today   Follow-Up: At [facility], you and your health needs are our priority.  As part of our continuing mission to provide you with exceptional heart care, our providers are all part of one team.  This team includes your primary Cardiologist (physician) and Advanced Practice Providers or APPs (Physician Assistants and Nurse Practitioners) who all work together to provide you with the care you need, when you need it.  Your next appointment:   1 month(s)  Provider:   Lonni Hanson, MD    Your physician recommends that you schedule a follow-up appointment with EP

## 2023-09-23 NOTE — Telephone Encounter (Signed)
VO given to [place].

## 2023-09-25 ENCOUNTER — Inpatient Hospital Stay
Admission: EM | Admit: 2023-09-25 | Discharge: 2023-10-02 | DRG: 312 | Disposition: A | Discharge status: Skilled Nursing Facility | Attending: Internal Medicine | Admitting: Internal Medicine

## 2023-09-25 ENCOUNTER — Telehealth: Payer: Self-pay

## 2023-09-25 ENCOUNTER — Other Ambulatory Visit: Payer: Self-pay

## 2023-09-25 DIAGNOSIS — Z950 Presence of cardiac pacemaker: Secondary | ICD-10-CM

## 2023-09-25 DIAGNOSIS — Z820 Family history of epilepsy and other diseases of the nervous system: Secondary | ICD-10-CM

## 2023-09-25 DIAGNOSIS — I951 Orthostatic hypotension: Principal | ICD-10-CM | POA: Diagnosis present

## 2023-09-25 DIAGNOSIS — Z7901 Long term (current) use of anticoagulants: Secondary | ICD-10-CM

## 2023-09-25 DIAGNOSIS — I480 Paroxysmal atrial fibrillation: Secondary | ICD-10-CM | POA: Diagnosis present

## 2023-09-25 DIAGNOSIS — Z6830 Body mass index (BMI) 30.0-30.9, adult: Secondary | ICD-10-CM

## 2023-09-25 DIAGNOSIS — Z860100 Personal history of colon polyps, unspecified: Secondary | ICD-10-CM

## 2023-09-25 DIAGNOSIS — S93401A Sprain of unspecified ligament of right ankle, initial encounter: Secondary | ICD-10-CM | POA: Diagnosis present

## 2023-09-25 DIAGNOSIS — G40909 Epilepsy, unspecified, not intractable, without status epilepticus: Secondary | ICD-10-CM | POA: Diagnosis present

## 2023-09-25 DIAGNOSIS — Z853 Personal history of malignant neoplasm of breast: Secondary | ICD-10-CM

## 2023-09-25 DIAGNOSIS — I252 Old myocardial infarction: Secondary | ICD-10-CM

## 2023-09-25 DIAGNOSIS — J449 Chronic obstructive pulmonary disease, unspecified: Secondary | ICD-10-CM | POA: Diagnosis present

## 2023-09-25 DIAGNOSIS — Z923 Personal history of irradiation: Secondary | ICD-10-CM

## 2023-09-25 DIAGNOSIS — R55 Syncope and collapse: Principal | ICD-10-CM | POA: Diagnosis present

## 2023-09-25 DIAGNOSIS — Z8249 Family history of ischemic heart disease and other diseases of the circulatory system: Secondary | ICD-10-CM

## 2023-09-25 DIAGNOSIS — M25571 Pain in right ankle and joints of right foot: Secondary | ICD-10-CM | POA: Insufficient documentation

## 2023-09-25 DIAGNOSIS — R569 Unspecified convulsions: Secondary | ICD-10-CM

## 2023-09-25 DIAGNOSIS — R7989 Other specified abnormal findings of blood chemistry: Secondary | ICD-10-CM

## 2023-09-25 DIAGNOSIS — I25118 Atherosclerotic heart disease of native coronary artery with other forms of angina pectoris: Secondary | ICD-10-CM | POA: Diagnosis present

## 2023-09-25 DIAGNOSIS — M8580 Other specified disorders of bone density and structure, unspecified site: Secondary | ICD-10-CM | POA: Diagnosis present

## 2023-09-25 DIAGNOSIS — Z8049 Family history of malignant neoplasm of other genital organs: Secondary | ICD-10-CM

## 2023-09-25 DIAGNOSIS — M1990 Unspecified osteoarthritis, unspecified site: Secondary | ICD-10-CM | POA: Diagnosis present

## 2023-09-25 DIAGNOSIS — I2510 Atherosclerotic heart disease of native coronary artery without angina pectoris: Secondary | ICD-10-CM | POA: Diagnosis present

## 2023-09-25 DIAGNOSIS — J9611 Chronic respiratory failure with hypoxia: Secondary | ICD-10-CM | POA: Diagnosis present

## 2023-09-25 DIAGNOSIS — Z7982 Long term (current) use of aspirin: Secondary | ICD-10-CM

## 2023-09-25 DIAGNOSIS — Z885 Allergy status to narcotic agent status: Secondary | ICD-10-CM

## 2023-09-25 DIAGNOSIS — I2489 Other forms of acute ischemic heart disease: Secondary | ICD-10-CM | POA: Diagnosis not present

## 2023-09-25 DIAGNOSIS — Z79899 Other long term (current) drug therapy: Secondary | ICD-10-CM

## 2023-09-25 DIAGNOSIS — I442 Atrioventricular block, complete: Secondary | ICD-10-CM | POA: Diagnosis present

## 2023-09-25 DIAGNOSIS — Z87891 Personal history of nicotine dependence: Secondary | ICD-10-CM

## 2023-09-25 DIAGNOSIS — Z803 Family history of malignant neoplasm of breast: Secondary | ICD-10-CM

## 2023-09-25 DIAGNOSIS — Z7952 Long term (current) use of systemic steroids: Secondary | ICD-10-CM

## 2023-09-25 DIAGNOSIS — E669 Obesity, unspecified: Secondary | ICD-10-CM | POA: Diagnosis present

## 2023-09-25 DIAGNOSIS — Z823 Family history of stroke: Secondary | ICD-10-CM

## 2023-09-25 DIAGNOSIS — I5032 Chronic diastolic (congestive) heart failure: Secondary | ICD-10-CM | POA: Diagnosis present

## 2023-09-25 DIAGNOSIS — E876 Hypokalemia: Secondary | ICD-10-CM | POA: Diagnosis present

## 2023-09-25 DIAGNOSIS — Z9981 Dependence on supplemental oxygen: Secondary | ICD-10-CM

## 2023-09-25 DIAGNOSIS — Z95828 Presence of other vascular implants and grafts: Secondary | ICD-10-CM

## 2023-09-25 MED ORDER — PYRIDOSTIGMINE BROMIDE 60 MG PO TABS: 60.0000 mg | ORAL_TABLET | Freq: Three times a day (TID) | ORAL | 3 refills | Status: DC

## 2023-09-25 MED ORDER — FLUDROCORTISONE ACETATE 0.1 MG PO TABS: 0.1000 mg | ORAL_TABLET | Freq: Two times a day (BID) | ORAL | 3 refills | Status: DC

## 2023-09-25 NOTE — Telephone Encounter (Signed)
 Pt made aware of recommendations and verbalized understanding. Medication list updated   [place], Cadence H, PA-C  Desiderio Russell SAILOR, RN Can we please call the patient with Mds recommendations. Decrease florinef  back to 0.1mg BID and start Mestinon 60mg  TID. thanks

## 2023-09-25 NOTE — ED Triage Notes (Signed)
 Pt to ed from home via [facility] for unknown. Possible syncope and seizure. Pt has been seen 4 x in the last few months for similar. Pt is alert but unable to answer any questions. Pt is pale  and clammy. Pt wears 4 liters chronic at home. Was found at 88% on 4 liters. Increased to 6 liters and improved. Pt vomited all over herself as well. Per EMS pt was moaning and groaning in the floor and is completely new and abnormal.    98.9 ax temp Paced rhythm on monitor 152 BGL

## 2023-09-26 ENCOUNTER — Observation Stay

## 2023-09-26 DIAGNOSIS — R55 Syncope and collapse: Secondary | ICD-10-CM | POA: Diagnosis present

## 2023-09-26 DIAGNOSIS — Z950 Presence of cardiac pacemaker: Secondary | ICD-10-CM

## 2023-09-26 DIAGNOSIS — Z95828 Presence of other vascular implants and grafts: Secondary | ICD-10-CM

## 2023-09-26 DIAGNOSIS — I951 Orthostatic hypotension: Secondary | ICD-10-CM

## 2023-09-26 DIAGNOSIS — M25571 Pain in right ankle and joints of right foot: Secondary | ICD-10-CM | POA: Insufficient documentation

## 2023-09-26 DIAGNOSIS — R7989 Other specified abnormal findings of blood chemistry: Secondary | ICD-10-CM

## 2023-09-26 DIAGNOSIS — R569 Unspecified convulsions: Secondary | ICD-10-CM

## 2023-09-26 DIAGNOSIS — E876 Hypokalemia: Secondary | ICD-10-CM

## 2023-09-26 DIAGNOSIS — I480 Paroxysmal atrial fibrillation: Secondary | ICD-10-CM

## 2023-09-26 DIAGNOSIS — Z7901 Long term (current) use of anticoagulants: Secondary | ICD-10-CM

## 2023-09-26 DIAGNOSIS — I442 Atrioventricular block, complete: Secondary | ICD-10-CM

## 2023-09-26 LAB — BASIC METABOLIC PANEL WITH GFR
Anion gap: 9 (ref 5–15)
BUN: 13 mg/dL (ref 8–23)
CO2: 27 mmol/L (ref 22–32)
Calcium: 8.6 mg/dL — ABNORMAL LOW (ref 8.9–10.3)
Chloride: 102 mmol/L (ref 98–111)
Creatinine, Ser: 0.64 mg/dL (ref 0.44–1.00)
GFR, Estimated: >60 mL/min (ref >60)
Glucose, Bld: 119 mg/dL — ABNORMAL HIGH (ref 70–99)
Potassium: 2.9 mmol/L — ABNORMAL LOW (ref 3.5–5.1)
Sodium: 138 mmol/L (ref 135–145)

## 2023-09-26 LAB — CBC
HCT: 31.2 % — ABNORMAL LOW (ref 36.0–46.0)
HCT: 29.6 % — ABNORMAL LOW (ref 36.0–46.0)
Hemoglobin: 10.1 g/dL — ABNORMAL LOW (ref 12.0–15.0)
Hemoglobin: 9.4 g/dL — ABNORMAL LOW (ref 12.0–15.0)
MCH: 28.8 pg (ref 26.0–34.0)
MCH: 28.7 pg (ref 26.0–34.0)
MCHC: 32.4 g/dL (ref 30.0–36.0)
MCHC: 31.8 g/dL (ref 30.0–36.0)
MCV: 88.9 fL (ref 80.0–100.0)
MCV: 90.5 fL (ref 80.0–100.0)
Platelets: 382 K/uL (ref 150–400)
Platelets: 324 K/uL (ref 150–400)
RBC: 3.51 MIL/uL — ABNORMAL LOW (ref 3.87–5.11)
RBC: 3.27 MIL/uL — ABNORMAL LOW (ref 3.87–5.11)
RDW: 12.9 % (ref 11.5–15.5)
RDW: 12.9 % (ref 11.5–15.5)
WBC: 10.2 K/uL (ref 4.0–10.5)
WBC: 8.5 K/uL (ref 4.0–10.5)
nRBC: 0 % (ref 0.0–0.2)
nRBC: 0 % (ref 0.0–0.2)

## 2023-09-26 LAB — COMPREHENSIVE METABOLIC PANEL WITH GFR
ALT: 12 U/L (ref 0–44)
AST: 17 U/L (ref 15–41)
Albumin: 3.9 g/dL (ref 3.5–5.0)
Alkaline Phosphatase: 83 U/L (ref 38–126)
Anion gap: 12 (ref 5–15)
BUN: 16 mg/dL (ref 8–23)
CO2: 27 mmol/L (ref 22–32)
Calcium: 9.3 mg/dL (ref 8.9–10.3)
Chloride: 97 mmol/L — ABNORMAL LOW (ref 98–111)
Creatinine, Ser: 0.91 mg/dL (ref 0.44–1.00)
GFR, Estimated: >60 mL/min (ref >60)
Glucose, Bld: 130 mg/dL — ABNORMAL HIGH (ref 70–99)
Potassium: 2.6 mmol/L — CL (ref 3.5–5.1)
Sodium: 136 mmol/L (ref 135–145)
Total Bilirubin: 0.9 mg/dL (ref 0.0–1.2)
Total Protein: 7.8 g/dL (ref 6.5–8.1)

## 2023-09-26 LAB — TROPONIN I (HIGH SENSITIVITY): Troponin I (High Sensitivity): 81 ng/L — ABNORMAL HIGH (ref <18)

## 2023-09-26 LAB — MAGNESIUM: Magnesium: 1.7 mg/dL (ref 1.7–2.4)

## 2023-09-26 LAB — CBG MONITORING, ED: Glucose-Capillary: 116 mg/dL — ABNORMAL HIGH (ref 70–99)

## 2023-09-26 MED ORDER — ALBUTEROL SULFATE (2.5 MG/3ML) 0.083% IN NEBU: 2.5000 mg | INHALATION_SOLUTION | RESPIRATORY_TRACT | Status: DC | PRN

## 2023-09-26 MED ORDER — POTASSIUM CHLORIDE 10 MEQ/100ML IV SOLN
10.0000 meq | Freq: Once | INTRAVENOUS | Status: AC
  Administered 2023-09-26: 10 meq via INTRAVENOUS
  Filled 2023-09-26: qty 100

## 2023-09-26 MED ORDER — LEVETIRACETAM 250 MG PO TABS
250.0000 mg | ORAL_TABLET | Freq: Two times a day (BID) | ORAL | Status: DC
  Administered 2023-09-26 – 2023-10-02 (×13): 250 mg via ORAL
  Filled 2023-09-26 (×13): qty 1

## 2023-09-26 MED ORDER — DICLOFENAC SODIUM 1 % EX GEL
4.0000 g | Freq: Four times a day (QID) | CUTANEOUS | Status: DC
  Administered 2023-09-26 – 2023-10-02 (×22): 4 g via TOPICAL
  Filled 2023-09-26: qty 100

## 2023-09-26 MED ORDER — ACETAMINOPHEN 325 MG PO TABS
650.0000 mg | ORAL_TABLET | Freq: Four times a day (QID) | ORAL | Status: DC | PRN
  Administered 2023-09-26: 650 mg via ORAL
  Filled 2023-09-26: qty 2

## 2023-09-26 MED ORDER — ONDANSETRON HCL 4 MG/2ML IJ SOLN
4.0000 mg | Freq: Once | INTRAMUSCULAR | Status: AC
  Administered 2023-09-26: 4 mg via INTRAVENOUS
  Filled 2023-09-26: qty 2

## 2023-09-26 MED ORDER — ATORVASTATIN CALCIUM 20 MG PO TABS
40.0000 mg | ORAL_TABLET | Freq: Every day | ORAL | Status: DC
  Administered 2023-09-26 – 2023-10-02 (×7): 40 mg via ORAL
  Filled 2023-09-26 (×7): qty 2

## 2023-09-26 MED ORDER — POTASSIUM CHLORIDE CRYS ER 20 MEQ PO TBCR
40.0000 meq | EXTENDED_RELEASE_TABLET | ORAL | Status: AC
  Administered 2023-09-26 (×2): 40 meq via ORAL
  Filled 2023-09-26 (×2): qty 2

## 2023-09-26 MED ORDER — APIXABAN 5 MG PO TABS
5.0000 mg | ORAL_TABLET | Freq: Two times a day (BID) | ORAL | Status: DC
  Administered 2023-09-26 – 2023-10-02 (×13): 5 mg via ORAL
  Filled 2023-09-26 (×13): qty 1

## 2023-09-26 MED ORDER — SODIUM CHLORIDE 0.9% FLUSH
3.0000 mL | Freq: Two times a day (BID) | INTRAVENOUS | Status: DC
  Administered 2023-09-26 – 2023-10-02 (×13): 3 mL via INTRAVENOUS

## 2023-09-26 MED ORDER — PYRIDOSTIGMINE BROMIDE 60 MG PO TABS
60.0000 mg | ORAL_TABLET | Freq: Three times a day (TID) | ORAL | Status: DC
  Administered 2023-09-26 – 2023-10-02 (×13): 60 mg via ORAL
  Filled 2023-09-26 (×20): qty 1

## 2023-09-26 MED ORDER — PANTOPRAZOLE SODIUM 40 MG PO TBEC
40.0000 mg | DELAYED_RELEASE_TABLET | Freq: Every day | ORAL | Status: DC
  Administered 2023-09-26 – 2023-10-02 (×7): 40 mg via ORAL
  Filled 2023-09-26 (×7): qty 1

## 2023-09-26 MED ORDER — FLUOXETINE HCL 20 MG PO CAPS
20.0000 mg | ORAL_CAPSULE | Freq: Every day | ORAL | Status: DC
  Administered 2023-09-26 – 2023-10-02 (×7): 20 mg via ORAL
  Filled 2023-09-26 (×7): qty 1

## 2023-09-26 MED ORDER — MAGNESIUM SULFATE 2 GM/50ML IV SOLN
2.0000 g | Freq: Once | INTRAVENOUS | Status: AC
  Administered 2023-09-26: 2 g via INTRAVENOUS
  Filled 2023-09-26: qty 50

## 2023-09-26 MED ORDER — TRAMADOL HCL 50 MG PO TABS
50.0000 mg | ORAL_TABLET | Freq: Four times a day (QID) | ORAL | Status: DC | PRN
  Administered 2023-09-26 – 2023-10-02 (×5): 50 mg via ORAL
  Filled 2023-09-26 (×5): qty 1

## 2023-09-26 MED ORDER — MIDODRINE HCL 5 MG PO TABS
10.0000 mg | ORAL_TABLET | Freq: Three times a day (TID) | ORAL | Status: DC
  Administered 2023-09-26 – 2023-09-27 (×4): 10 mg via ORAL
  Filled 2023-09-26 (×4): qty 2

## 2023-09-26 MED ORDER — SODIUM CHLORIDE 0.9 % IV BOLUS
1000.0000 mL | Freq: Once | INTRAVENOUS | Status: AC
  Administered 2023-09-26: 1000 mL via INTRAVENOUS

## 2023-09-26 MED ORDER — ACETAMINOPHEN 650 MG RE SUPP: 650.0000 mg | Freq: Four times a day (QID) | RECTAL | Status: DC | PRN

## 2023-09-26 MED ORDER — ONDANSETRON HCL 4 MG/2ML IJ SOLN
4.0000 mg | Freq: Four times a day (QID) | INTRAMUSCULAR | Status: DC | PRN
  Administered 2023-09-26 – 2023-10-01 (×2): 4 mg via INTRAVENOUS
  Filled 2023-09-26 (×2): qty 2

## 2023-09-26 MED ORDER — FLUDROCORTISONE ACETATE 0.1 MG PO TABS
0.1000 mg | ORAL_TABLET | Freq: Two times a day (BID) | ORAL | Status: DC
  Administered 2023-09-26 – 2023-09-30 (×9): 0.1 mg via ORAL
  Filled 2023-09-26 (×9): qty 1

## 2023-09-26 MED ORDER — ONDANSETRON HCL 4 MG PO TABS: 4.0000 mg | ORAL_TABLET | Freq: Four times a day (QID) | ORAL | Status: DC | PRN

## 2023-09-26 MED ORDER — ZOLPIDEM TARTRATE 5 MG PO TABS
5.0000 mg | ORAL_TABLET | Freq: Every evening | ORAL | Status: DC | PRN
  Administered 2023-09-26 – 2023-10-01 (×7): 5 mg via ORAL
  Filled 2023-09-26 (×7): qty 1

## 2023-09-26 NOTE — Assessment & Plan Note (Addendum)
 History of severe orthostatic hypotension, with recurrent syncope Suspect related to orthostatic hypotension.  Seizure not suspected Continue midodrine  10 mg 3 times daily Recently restarted on fludrocortisone  0.1 mg twice daily([facility]) Continue pyridostigmine -a new prescription which she was not started yet and is being started as inpatient Continue abdominal binder Advised to hydrate 3 L fluid daily([facility]) Cardiology was consulted and they are recommending observing for another night on pyridostigmine . PT and OT evaluation ordered

## 2023-09-26 NOTE — ED Notes (Signed)
 Pt eating breakfast tray

## 2023-09-26 NOTE — Assessment & Plan Note (Addendum)
 Potassium with some improvement to 2.9 with magnesium  of 1.7 Takes potassium supplements at baseline.  Recently prescribed by [facility] -Replete potassium and magnesium  -Continue to monitor

## 2023-09-26 NOTE — Assessment & Plan Note (Addendum)
 CAD with history of MI Suspect secondary to syncopal event EKG nonacute and patient does not have chest pain Continue aspirin  and atorvastatin  Cardiology is on board

## 2023-09-26 NOTE — ED Provider Notes (Signed)
 [facility] Provider Note    Event Date/Time   First MD Initiated Contact with Patient 09/25/23 2327     (approximate)   History   Syncope  HPI  Diana Cook is a 73 y.o. female with a history of orthostatic hypotension, seizures who presents after likely syncopal episode.  Husband reports that he was helping her on the bedside commode, when he was moving her back to the bed she became unresponsive, he describes her breathing as funny but no clear seizure-like activity.  He reports she has not had a seizure in 6 or 7 years.     Physical Exam   Triage Vital Signs: ED Triage Vitals [09/25/23 2301]  Encounter Vitals Group     BP (!) 143/68     Girls Systolic BP Percentile      Girls Diastolic BP Percentile      Boys Systolic BP Percentile      Boys Diastolic BP Percentile      Pulse Rate 93     Resp 20     Temp 98.7 F (37.1 C)     Temp Source Axillary     SpO2 94 %     Weight      Height 1.727 m (5' 8)     Head Circumference      Peak Flow      Pain Score 0     Pain Loc      Pain Education      Exclude from Growth Chart     Most recent vital signs: Vitals:   09/26/23 0300 09/26/23 0302  BP: (!) 143/68   Pulse: 68   Resp: (!) 22   Temp:  98.8 F (37.1 C)  SpO2: 93%      General: Awake, no distress.  CV:  Good peripheral perfusion.  Resp:  Normal effort.  Abd:  No distention.  Other:  Normal range of motion of all extremities, answering questions appropriately   ED Results / Procedures / Treatments   Labs (all labs ordered are listed, but only abnormal results are displayed) Labs Reviewed  CBC - Abnormal; Notable for the following components:      Result Value   RBC 3.51 (*)    Hemoglobin 10.1 (*)    HCT 31.2 (*)    All other components within normal limits  COMPREHENSIVE METABOLIC PANEL WITH GFR - Abnormal; Notable for the following components:   Potassium 2.6 (*)    Chloride 97 (*)    Glucose, Bld 130 (*)    All  other components within normal limits  TROPONIN I (HIGH SENSITIVITY) - Abnormal; Notable for the following components:   Troponin I (High Sensitivity) 81 (*)    All other components within normal limits  BASIC METABOLIC PANEL WITH GFR  CBC     EKG  ED ECG REPORT I, Lamar Price, the attending physician, personally viewed and interpreted this ECG.  Date: 09/26/2023  Rhythm: normal sinus rhythm QRS Axis: Abnormal Intervals: Abnormal ST/T Wave abnormalities: Nonspecific changes Narrative Interpretation: no evidence of acute ischemia    RADIOLOGY     PROCEDURES:  Critical Care performed: yes  CRITICAL CARE Performed by: Lamar Price   Total critical care time: 30 minutes  Critical care time was exclusive of separately billable procedures and treating other patients.  Critical care was necessary to treat or prevent imminent or life-threatening deterioration.  Critical care was time spent personally by me on the following activities: development  of treatment plan with patient and/or surrogate as well as nursing, discussions with consultants, evaluation of patient's response to treatment, examination of patient, obtaining history from patient or surrogate, ordering and performing treatments and interventions, ordering and review of laboratory studies, ordering and review of radiographic studies, pulse oximetry and re-evaluation of patient's condition.   Procedures   MEDICATIONS ORDERED IN ED: Medications  atorvastatin  (LIPITOR) tablet 40 mg (has no administration in time range)  midodrine  (PROAMATINE ) tablet 10 mg (has no administration in time range)  FLUoxetine  (PROZAC ) capsule 20 mg (has no administration in time range)  zolpidem  (AMBIEN ) tablet 5 mg (5 mg Oral Given 09/26/23 0311)  fludrocortisone  (FLORINEF ) tablet 0.1 mg (has no administration in time range)  pantoprazole  (PROTONIX ) EC tablet 40 mg (has no administration in time range)  apixaban  (ELIQUIS ) tablet  5 mg (has no administration in time range)  levETIRAcetam  (KEPPRA ) tablet 250 mg (has no administration in time range)  pyridostigmine  (MESTINON ) tablet 60 mg (has no administration in time range)  albuterol  (PROVENTIL ) (2.5 MG/3ML) 0.083% nebulizer solution 2.5 mg (has no administration in time range)  sodium chloride  flush (NS) 0.9 % injection 3 mL (3 mLs Intravenous Given 09/26/23 0308)  acetaminophen  (TYLENOL ) tablet 650 mg (has no administration in time range)    Or  acetaminophen  (TYLENOL ) suppository 650 mg (has no administration in time range)  ondansetron  (ZOFRAN ) tablet 4 mg (has no administration in time range)    Or  ondansetron  (ZOFRAN ) injection 4 mg (has no administration in time range)  potassium chloride  10 mEq in 100 mL IVPB (0 mEq Intravenous Stopped 09/26/23 0302)  ondansetron  (ZOFRAN ) injection 4 mg (4 mg Intravenous Given 09/26/23 0102)  sodium chloride  0.9 % bolus 1,000 mL (1,000 mLs Intravenous New Bag/Given 09/26/23 0307)     IMPRESSION / MDM / ASSESSMENT AND PLAN / ED COURSE  I reviewed the triage vital signs and the nursing notes. Patient's presentation is most consistent with acute presentation with potential threat to life or bodily function.  Patient presents after likely prolonged syncopal episode, husband reports this was longer than typical.  Here she is overall feeling improved, complains of some nausea however.  Will treat with IV Zofran .  Labs pending  Lab work is notable for significant hypokalemia of 2.6, will give IV potassium 10 mill equivalents with saline.  Troponin is also mildly elevated with a high sensitive troponin of 81, she has no chest pain at all however, EKG does not appear significantly changed from prior.  Given the above lab abnormalities, discussed with the hospitalist for admission        FINAL CLINICAL IMPRESSION(S) / ED DIAGNOSES   Final diagnoses:  Syncope, unspecified syncope type  Elevated troponin  Hypokalemia     Rx  / DC Orders   ED Discharge Orders     None        Note:  This document was prepared using Dragon voice recognition software and may include unintentional dictation errors.   Arlander Charleston, MD 09/26/23 [phone]

## 2023-09-26 NOTE — Assessment & Plan Note (Signed)
 Chronic respiratory failure with hypoxia on 4 L home O2 Not acutely exacerbated Nebs as needed Continue supplemental oxygen 

## 2023-09-26 NOTE — H&P (Signed)
 History and Physical    Patient: Diana Cook FMW:[id number] DOB: Aug 31, 1950 DOA: 09/25/2023 DOS: the patient was seen and examined on 09/26/2023 PCP: Randeen Laine LABOR, MD  Patient coming from: Home  Chief Complaint:  Chief Complaint  Patient presents with   Seizures    HPI: Diana Cook is a 73 y.o. female with medical history significant for COPD on 4 L O2 via [place], CAD/MI, CHB s/p PPM, carotid stenosis s/p stents on Eliquis , seizures, lumbar radiculopathy, OA, breast cancer s/p surgery/RT, HFpEF, and severe orthostatic hypotension for which she was hospitalized a couple weeks prior with a subsequent ED visit at [facility] who is being admitted for syncopal event.  Patient has several brief syncopal events related to orthostatic hypotension usually resolved by lying down however on the night of arrival she had an prolonged syncopal episode that was slow to improve with lying flat.  She eventually came around after 10 to 15 minutes.  She had no jerking or seizure-like activity.  She is currently midodrine  and fludrocortisone  and an abdominal binder.  She had a recent pacemaker interrogation with increase limit on PPM for HR,.  She is awaiting a [facility] cardiology follow-up appointment. Arrival in the ED vitals were within normal limits.  Orthostatics not rechecked.  Labs pertinent for troponin of 81 and potassium of 2.6.  Hemoglobin was at baseline at 10.1 EKG showed sinus at 86 Patient treated with Zofran   Admission requested     Past Medical History:  Diagnosis Date   Allergic rhinitis    Arthritis    Asthma    as a child, mild now   Breast cancer (HCC) 12/2015   right breast cancer, lumpectomy and mammosite    Colon polyps    colonoscopy 7/08, tubular adenoma   Complete heart block (HCC) 09/2011   s/p PPM implanted in [facility]   COPD (chronic obstructive pulmonary disease) (HCC)    Myocardial infarction (HCC) 2011   Osteopenia 10/2015   Pacemaker    2011   Paroxysmal atrial  fibrillation (HCC) 03/2021   Incidentally detected on pacemaker interrogation   Personal history of radiation therapy 2017   right breast ca, mammosite placed   Rotator cuff tear, right 02/2022   Had an MRI in [place] I have 2 tears.   Seizure disorder (HCC)    Seizures (HCC)    first one was when she was 73 years old    Small bowel obstruction (HCC)    1988 and 2002   Tobacco abuse    Past Surgical History:  Procedure Laterality Date   ABDOMINAL HYSTERECTOMY     BREAST BIOPSY Right 2007   benign inflammatory changes, mass due to underwire bra   BREAST BIOPSY Left 01/02/2016   columnar cell changes without atypical hyperplasia.   BREAST BIOPSY Right 12/06/2015   rt breast mass 10:00, bx done at Dr. Gilberto office, invasive ductal carcinoma   BREAST EXCISIONAL BIOPSY Left 01/02/2016   COLUMNAR CELL CHANGE AND HYPERPLASIA ASSOCIATED WITH LUMINAL AND STROMAL CALCIFICATIONS   BREAST LUMPECTOMY Right 01/02/2016   invasive mammary carcinoma, clear margins, negative LN   BREAST LUMPECTOMY WITH SENTINEL LYMPH NODE BIOPSY Right 01/02/2016   pT1c, N0; ER/ PR 100%; Her 2 neu not over expressed: BREAST LUMPECTOMY WITH SENTINEL LYMPH NODE BX;  Surgeon: Reyes LELON Cota, MD;  Location: [facility] ORS;  Service: General;  Laterality: Right;   CARDIAC CATHETERIZATION     CAROTID PTA/STENT INTERVENTION Right 04/29/2022   Procedure: CAROTID PTA/STENT  INTERVENTION;  Surgeon: Jama Cordella MATSU, MD;  Location: [facility] INVASIVE CV LAB;  Service: Cardiovascular;  Laterality: Right;   CATARACT EXTRACTION Bilateral    COLONOSCOPY  10/2015   Dr Teressa   COLONOSCOPY WITH PROPOFOL  N/A 08/19/2018   Procedure: COLONOSCOPY WITH PROPOFOL ;  Surgeon: Janalyn Keene NOVAK, MD;  Location: [facility] ENDOSCOPY;  Service: Endoscopy;  Laterality: N/A;   ESOPHAGOGASTRODUODENOSCOPY (EGD) WITH PROPOFOL  N/A 08/19/2018   Procedure: ESOPHAGOGASTRODUODENOSCOPY (EGD) WITH PROPOFOL ;  Surgeon: Janalyn Keene NOVAK, MD;  Location: [facility] ENDOSCOPY;   Service: Endoscopy;  Laterality: N/A;   EXPLORATORY LAPAROTOMY  01/25/2001   Exploratory laparotomy, lysis of adhesions, identification of internal hernia secondary to omental adhesion. Prolonged postoperative ileus.   gyn surgery  1993   hysterectomy- form endometriosis   LAPAROSCOPY     PACEMAKER INSERTION  10/24/11   [employer] Advantio dual chamber PPM implanted by Dr Ronny at [facility] in [place]   PPM GENERATOR CHANGEOUT N/A 12/05/2019   Procedure: PPM GENERATOR HERMA;  Surgeon: Fernande Elspeth BROCKS, MD;  Location: [facility] INVASIVE CV LAB;  Service: Cardiovascular;  Laterality: N/A;   RIGHT/LEFT HEART CATH AND CORONARY ANGIOGRAPHY Bilateral 07/19/2019   Procedure: RIGHT/LEFT HEART CATH AND CORONARY ANGIOGRAPHY;  Surgeon: Mady Bruckner, MD;  Location: [facility] INVASIVE CV LAB;  Service: Cardiovascular;  Laterality: Bilateral;   TEMPORARY PACEMAKER N/A 12/05/2019   Procedure: TEMPORARY PACEMAKER;  Surgeon: Fernande Elspeth BROCKS, MD;  Location: [facility] INVASIVE CV LAB;  Service: Cardiovascular;  Laterality: N/A;   Social History:  reports that she quit smoking about 12 years ago. Her smoking use included cigarettes. She started smoking about 54 years ago. She has a 63 pack-year smoking history. She has never used smokeless tobacco. She reports current alcohol use of about 1.0 - 2.0 standard drink of alcohol per week. She reports that she does not use drugs.  Allergies  Allergen Reactions   Codeine Nausea And Vomiting   Morphine  And Codeine Nausea Only    Family History  Problem Relation Age of Onset   Stroke Mother    Heart disease Mother 62       MI and CABG   Dementia Mother    Coronary artery disease Father    Parkinsonism Father    Breast cancer Sister    Cancer - Cervical Daughter 58       died 03/27/23   Heart attack Brother 97   Colon cancer Neg Hx     Prior to Admission medications   Medication Sig Start Date End Date Taking? Authorizing Provider   acetaminophen  (TYLENOL ) 500 MG tablet Take 500-1,000 mg by mouth every 6 (six) hours as needed for mild pain or headache.     [provider]  albuterol  (VENTOLIN  HFA) 108 (90 Base) MCG/ACT inhaler INHALE 2 PUFFS BY MOUTH EVERY 4 HOURS AS NEEDED FOR WHEEZING OR SHORTNESS OF BREATH 05/25/23   Tamea Dedra CROME, MD  apixaban  (ELIQUIS ) 5 MG TABS tablet Take 1 tablet (5 mg total) by mouth 2 (two) times daily. 06/17/23   Fernande Elspeth BROCKS, MD  aspirin  EC 81 MG tablet Take 1 tablet (81 mg total) by mouth daily. 12/05/22   Schnier, Cordella MATSU, MD  atorvastatin  (LIPITOR) 40 MG tablet Take 1 tablet by mouth once daily 02/13/23   Fernande Elspeth BROCKS, MD  benzonatate  (TESSALON ) 200 MG capsule Take 1 capsule (200 mg total) by mouth 2 (two) times daily as needed for cough. 03/03/23   Avelina Greig BRAVO, MD  Cholecalciferol  (VITAMIN  D3) 50 MCG (2000 UT) capsule Take 2,000 Units by mouth daily.    [provider]  fludrocortisone  (FLORINEF ) 0.1 MG tablet Take 1 tablet (0.1 mg total) by mouth 2 (two) times daily. 09/25/23 09/24/24  Furth, Cadence H, PA-C  FLUoxetine  (PROZAC ) 20 MG capsule Take 1 capsule by mouth once daily 06/23/23   Tower, Laine LABOR, MD  gabapentin  (NEURONTIN ) 100 MG capsule Take 200 mg by mouth 2 (two) times daily as needed. 03/02/23   [provider]  levETIRAcetam  (KEPPRA ) 250 MG tablet Take 250 mg by mouth 2 (two) times daily.  11/02/19   [provider]  midodrine  (PROAMATINE ) 10 MG tablet Take 1 tablet (10 mg total) by mouth 3 (three) times daily with meals. 09/11/23   Awanda City, MD  OXYGEN  Inhale 4 L into the lungs continuous.    [provider]  pantoprazole  (PROTONIX ) 40 MG tablet Take 1 tablet (40 mg total) by mouth daily. 11/20/22 11/20/23  Tamea Dedra CROME, MD  potassium chloride  (KLOR-CON  M) 10 MEQ tablet Take 10 mEq by mouth daily. 09/15/23 10/15/23  [provider]  pyridostigmine  (MESTINON ) 60 MG tablet Take 1 tablet (60 mg total) by mouth 3 (three) times  daily. 09/25/23   Furth, Cadence H, PA-C  Tiotropium Bromide-Olodaterol (STIOLTO RESPIMAT ) 2.5-2.5 MCG/ACT AERS INHALE 2 PUFFS BY MOUTH ONCE DAILY 05/25/23   Tamea Dedra CROME, MD  zolpidem  (AMBIEN ) 10 MG tablet TAKE 1/2 TO 1 (ONE-HALF TO ONE) TABLET BY MOUTH AT BEDTIME AS NEEDED FOR SLEEP 06/23/23   Randeen Laine LABOR, MD    Physical Exam: Vitals:   09/25/23 2301 09/25/23 2325 09/26/23 0030  BP: (!) 143/68 (!) 155/73 135/66  Pulse: 93 84 71  Resp: 20 (!) 25 14  Temp: 98.7 F (37.1 C)    TempSrc: Axillary    SpO2: 94% 95% 96%  Height: 5' 8 (1.727 m)     Physical Exam Vitals and nursing note reviewed.  Constitutional:      General: She is not in acute distress. HENT:     Head: Normocephalic and atraumatic.  Cardiovascular:     Rate and Rhythm: Normal rate and regular rhythm.     Heart sounds: Normal heart sounds.  Pulmonary:     Effort: Pulmonary effort is normal.     Breath sounds: Normal breath sounds.  Abdominal:     Palpations: Abdomen is soft.     Tenderness: There is no abdominal tenderness.  Neurological:     Mental Status: Mental status is at baseline.     Labs on Admission: I have personally reviewed following labs and imaging studies  CBC: Recent Labs  Lab 09/25/23 2305  WBC 10.2  HGB 10.1*  HCT 31.2*  MCV 88.9  PLT 382   Basic Metabolic Panel: Recent Labs  Lab 09/25/23 2305  NA 136  K 2.6*  CL 97*  CO2 27  GLUCOSE 130*  BUN 16  CREATININE 0.91  CALCIUM  9.3   GFR: Estimated Creatinine Clearance: 63.2 mL/min (by C-G formula based on SCr of 0.91 mg/dL). Liver Function Tests: Recent Labs  Lab 09/25/23 2305  AST 17  ALT 12  ALKPHOS 83  BILITOT 0.9  PROT 7.8  ALBUMIN 3.9   No results for input(s): LIPASE, AMYLASE in the last 168 hours. No results for input(s): AMMONIA in the last 168 hours. Coagulation Profile: No results for input(s): INR, PROTIME in the last 168 hours. Cardiac Enzymes: No results for input(s): CKTOTAL,  CKMB, CKMBINDEX, TROPONINI in  the last 168 hours. BNP (last 3 results) No results for input(s): PROBNP in the last 8760 hours. HbA1C: No results for input(s): HGBA1C in the last 72 hours. CBG: No results for input(s): GLUCAP in the last 168 hours. Lipid Profile: No results for input(s): CHOL, HDL, LDLCALC, TRIG, CHOLHDL, LDLDIRECT in the last 72 hours. Thyroid  Function Tests: No results for input(s): TSH, T4TOTAL, FREET4, T3FREE, THYROIDAB in the last 72 hours. Anemia Panel: No results for input(s): VITAMINB12, FOLATE, FERRITIN, TIBC, IRON, RETICCTPCT in the last 72 hours. Urine analysis:    Component Value Date/Time   COLORURINE YELLOW (A) 08/29/2023 2050   APPEARANCEUR CLOUDY (A) 08/29/2023 2050   APPEARANCEUR Clear 04/01/2013 0815   LABSPEC 1.024 08/29/2023 2050   LABSPEC >1.060 04/01/2013 0815   PHURINE 6.0 08/29/2023 2050   GLUCOSEU NEGATIVE 08/29/2023 2050   GLUCOSEU NEGATIVE 05/05/2023 1410   HGBUR MODERATE (A) 08/29/2023 2050   BILIRUBINUR NEGATIVE 08/29/2023 2050   BILIRUBINUR Negative 02/24/2023 0951   BILIRUBINUR Negative 04/01/2013 0815   KETONESUR NEGATIVE 08/29/2023 2050   PROTEINUR 100 (A) 08/29/2023 2050   UROBILINOGEN 0.2 05/05/2023 1410   NITRITE NEGATIVE 08/29/2023 2050   LEUKOCYTESUR LARGE (A) 08/29/2023 2050   LEUKOCYTESUR Negative 04/01/2013 0815    Radiological Exams on Admission: No results found. Data Reviewed for HPI: Relevant notes from primary care and specialist visits, past discharge summaries as available in EHR, including Care Everywhere. Prior diagnostic testing as pertinent to current admission diagnoses Updated medications and problem lists for reconciliation ED course, including vitals, labs, imaging, treatment and response to treatment Triage notes, nursing and pharmacy notes and ED provider's notes Notable results as noted above in HPI      Assessment and Plan: * Syncope and  collapse History of severe orthostatic hypotension, with recurrent syncope Suspect related to orthostatic hypotension.  Seizure not suspected Continue midodrine  10 mg 3 times daily Recently restarted on fludrocortisone  0.1 mg twice daily([facility]) Continue pyridostigmine  Continue abdominal binder Advised to hydrate 3 L fluid daily([facility]) Will give a fluid bolus  Elevated troponin CAD with history of MI Suspect secondary to syncopal event EKG nonacute and patient does not have chest pain We will continue to trend Continue aspirin  and atorvastatin   Hypokalemia Received IV repletion in the ED Monitor monitor and correct as needed Takes potassium supplements at baseline.  Recently prescribed by [facility]  Complete heart block s/p pacemaker [facility]) No acute issues suspected Interrogated on 7/22 by cardiologist at [facility] HR limit increased  Carotid stenosis s/p stents Chronic anticoagulation No acute issues suspected Continue Eliquis   Seizures (HCC) Last seizure 6 years ago Acute seizure not suspected at this time Continue Keppra   Chronic heart failure with preserved ejection fraction (HFpEF) (HCC) Clinically euvolemic  Chronic obstructive pulmonary disease (HCC) Chronic respiratory failure with hypoxia on 4 L home O2 Not acutely exacerbated Nebs as needed Continue supplemental oxygen   History of breast cancer S/p surgery/RT  no acute issues suspected     DVT prophylaxis: Lovenox   Consults: none  Advance Care Planning:   Code Status: Prior   Family Communication: none  Disposition Plan: Back to previous home environment  Severity of Illness: The appropriate patient status for this patient is OBSERVATION. Observation status is judged to be reasonable and necessary in order to provide the required intensity of service to ensure the patient's safety. The patient's presenting symptoms, physical exam findings, and initial radiographic and laboratory data in the context  of their medical condition is felt to place them at  decreased risk for further clinical deterioration. Furthermore, it is anticipated that the patient will be medically stable for discharge from the hospital within 2 midnights of admission.   Author: Delayne LULLA Solian, MD 09/26/2023 2:27 AM  For on call review [email or web address].

## 2023-09-26 NOTE — Hospital Course (Addendum)
 Taken from H&P.   Diana Cook is a 73 y.o. female with medical history significant for COPD on 4 L O2 via [place], CAD/MI, CHB s/p PPM, carotid stenosis s/p stents on Eliquis , seizures, lumbar radiculopathy, OA, breast cancer s/p surgery/RT, HFpEF, and severe orthostatic hypotension for which she was hospitalized a couple weeks prior with a subsequent ED visit at [facility] who is being admitted for syncopal event.  Patient has several brief syncopal events related to orthostatic hypotension usually resolved by lying down however on the night of arrival she had an prolonged syncopal episode that was slow to improve with lying flat.  She eventually came around after 10 to 15 minutes.  She had no jerking or seizure-like activity.  She is currently midodrine  and fludrocortisone  and an abdominal binder.  She had a recent pacemaker interrogation with increase limit on PPM for HR,.  She is awaiting a [facility] cardiology follow-up appointment.   On presentation stable vitals, labs with troponin of 81, potassium 2.6, hemoglobin at baseline of 10.1.EKG with NSR.  8/2: Hemodynamically stable but orthostatic vitals remain positive.  Patient was recently given prescription for pyridostigmine  by her cardiologist but she has not started it yet, it was started as inpatient.  Cardiology was also consulted. Cardiology would like to keep her for another night on pyridostigmine , need to be ambulated before discharge to see any symptoms.  Patient need to use compression stockings and abdominal binder before ambulation.  She will continue on current medications.  There was also concern of autonomic dysfunction-discussed with patient so she can follow-up with her neurologist for further assistance and evaluation.  PT right ankle was also ordered as she was complaining of pain secondary to fall. PT and OT evaluation ordered.

## 2023-09-26 NOTE — Progress Notes (Signed)
 Progress Note   Patient: Diana Cook FMW:[id number] DOB: 09/11/1950 DOA: 09/25/2023     0 DOS: the patient was seen and examined on 09/26/2023   Brief hospital course: Taken from H&P.   Diana Cook is a 73 y.o. female with medical history significant for COPD on 4 L O2 via [place], CAD/MI, CHB s/p PPM, carotid stenosis s/p stents on Eliquis , seizures, lumbar radiculopathy, OA, breast cancer s/p surgery/RT, HFpEF, and severe orthostatic hypotension for which she was hospitalized a couple weeks prior with a subsequent ED visit at [facility] who is being admitted for syncopal event.  Patient has several brief syncopal events related to orthostatic hypotension usually resolved by lying down however on the night of arrival she had an prolonged syncopal episode that was slow to improve with lying flat.  She eventually came around after 10 to 15 minutes.  She had no jerking or seizure-like activity.  She is currently midodrine  and fludrocortisone  and an abdominal binder.  She had a recent pacemaker interrogation with increase limit on PPM for HR,.  She is awaiting a [facility] cardiology follow-up appointment.   On presentation stable vitals, labs with troponin of 81, potassium 2.6, hemoglobin at baseline of 10.1.EKG with NSR.  8/2: Hemodynamically stable but orthostatic vitals remain positive.  Patient was recently given prescription for pyridostigmine  by her cardiologist but she has not started it yet, it was started as inpatient.  Cardiology was also consulted. Cardiology would like to keep her for another night on pyridostigmine , need to be ambulated before discharge to see any symptoms.  Patient need to use compression stockings and abdominal binder before ambulation.  She will continue on current medications.  There was also concern of autonomic dysfunction-discussed with patient so she can follow-up with her neurologist for further assistance and evaluation.  PT right ankle was also ordered as she was complaining  of pain secondary to fall. PT and OT evaluation ordered.  Assessment and Plan: * Syncope and collapse History of severe orthostatic hypotension, with recurrent syncope Suspect related to orthostatic hypotension.  Seizure not suspected Continue midodrine  10 mg 3 times daily Recently restarted on fludrocortisone  0.1 mg twice daily([facility]) Continue pyridostigmine -a new prescription which she was not started yet and is being started as inpatient Continue abdominal binder Advised to hydrate 3 L fluid daily([facility]) Cardiology was consulted and they are recommending observing for another night on pyridostigmine . PT and OT evaluation ordered  Elevated troponin CAD with history of MI Suspect secondary to syncopal event EKG nonacute and patient does not have chest pain Continue aspirin  and atorvastatin  Cardiology is on board  Hypokalemia Potassium with some improvement to 2.9 with magnesium  of 1.7 Takes potassium supplements at baseline.  Recently prescribed by [facility] -Replete potassium and magnesium  -Continue to monitor  Complete heart block s/p pacemaker (HCC) No acute issues suspected Interrogated on 7/22 by cardiologist at [facility] HR limit increased  Carotid stenosis s/p stents Chronic anticoagulation No acute issues suspected Continue Eliquis   Seizures (HCC) Last seizure 6 years ago Acute seizure not suspected at this time Continue Keppra   Ankle pain, right Right ankle pain and mild swelling after the fall. - DG right ankle ordered  Chronic heart failure with preserved ejection fraction (HFpEF) (HCC) Clinically euvolemic  Chronic obstructive pulmonary disease (HCC) Chronic respiratory failure with hypoxia on 4 L home O2 Not acutely exacerbated Nebs as needed Continue supplemental oxygen   History of breast cancer S/p surgery/RT  no acute issues suspected   Subjective: Patient was seen and  examined today.  Denies any chest pain or dizziness while lying down.   Longstanding history of postural hypotension and syncopal episodes.  Apparently this recent episode was lasted much longer than prior episodes. Pain around right ankle since fall.  Physical Exam: Vitals:   09/26/23 1100 09/26/23 1300 09/26/23 1400 09/26/23 1525  BP: 134/61 (!) 143/63 (!) 143/68   Pulse: 65 72 71   Resp: (!) 21 (!) 31 16   Temp:    98.3 F (36.8 C)  TempSrc:    Oral  SpO2: 94% 90% 97%   Height:       General.  Well-developed elderly lady, in no acute distress. Pulmonary.  Lungs clear bilaterally, normal respiratory effort. CV.  Regular rate and rhythm, no JVD, rub or murmur. Abdomen.  Soft, nontender, nondistended, BS positive. CNS.  Alert and oriented .  No focal neurologic deficit. Extremities.  No edema, pulses intact and symmetrical. Mild edema and tenderness around right ankle Psychiatry.  Judgment and insight appears normal.   Data Reviewed: Prior data reviewed  Family Communication: Discussed with husband at bedside  Disposition: Status is: Observation The patient remains OBS appropriate and will d/c before 2 midnights.  Planned Discharge Destination: Home  DVT prophylaxis.  Eliquis  Time spent:  minutes  This record has been created using Conservation officer, historic buildings. Errors have been sought and corrected,but may not always be located. Such creation errors do not reflect on the standard of care.   Author: Amaryllis Dare, MD 09/26/2023 4:08 PM  For on call review [email or web address].

## 2023-09-26 NOTE — Assessment & Plan Note (Signed)
Clinically euvolemic 

## 2023-09-26 NOTE — Assessment & Plan Note (Deleted)
-  Continue aspirin and atorvastatin

## 2023-09-26 NOTE — ED Notes (Signed)
 Applied ice pack on pt R ankle at this time per RN.

## 2023-09-26 NOTE — Assessment & Plan Note (Addendum)
 No acute issues suspected Interrogated on 7/22 by cardiologist at [facility] HR limit increased

## 2023-09-26 NOTE — Assessment & Plan Note (Addendum)
 Last seizure 6 years ago Acute seizure not suspected at this time Continue Keppra 

## 2023-09-26 NOTE — Assessment & Plan Note (Addendum)
 Chronic anticoagulation No acute issues suspected Continue Eliquis 

## 2023-09-26 NOTE — Consult Note (Signed)
 Cardiology Consultation   Patient ID: Diana Cook MRN: [id number]; DOB: [birth year]  Admit date: 09/25/2023 Date of Consult: 09/26/2023  PCP:  Randeen Laine LABOR, MD   [facility] Providers Cardiologist:  Lonni Hanson, MD        Patient Profile: Diana Cook is a 73 y.o. female with a hx of nonobstructive CAD, complete heart block s/p PPM who is being seen 09/26/2023 for the evaluation of orthostatic hypotension at the request of Dr. Caleen.  History of Present Illness: Diana Cook is a 73 year old female with nonobstructive CAD, complete heart block s/p PPM 2013, GEN change 2021, orthostatic hypotension, carotid stenosis s/p carotid stent placement presenting due to syncope.  Patient has a history of orthostasis and syncope.  Admitted last month, hospitalized for about 2 weeks due to orthostatic hypotension.  Was also seen at [facility].  Medications were adjusted, placed on midodrine  10 mg 3 times daily, Florinef  0.1 mg twice daily.  Was seen in the outpatient setting recently, Mestinon  was added but patient has not started taking it.  She states having an episode of passing out while standing from her wheelchair onto her bed.  States hearing her husband's voice fading prior to fall.  Apparently she had worsening increased work of breathing compared to other falls prompting husband to call EMS.  EKG on admission showing AV paced rhythm.  Patient wearing flat on bed, feels okay.  Sister and sister-in-law at bedside.   Past Medical History:  Diagnosis Date   Allergic rhinitis    Arthritis    Asthma    as a child, mild now   Breast cancer (HCC) 12/2015   right breast cancer, lumpectomy and mammosite    Colon polyps    colonoscopy 7/08, tubular adenoma   Complete heart block (HCC) 09/2011   s/p PPM implanted in [place] [facility]   COPD (chronic obstructive pulmonary disease) (HCC)    Myocardial infarction (HCC) 2011   Osteopenia 10/2015   Pacemaker    2011   Paroxysmal atrial  fibrillation (HCC) 03/2021   Incidentally detected on pacemaker interrogation   Personal history of radiation therapy 2017   right breast ca, mammosite placed   Rotator cuff tear, right 02/2022   Had an MRI in [place] I have 2 tears.   Seizure disorder (HCC)    Seizures (HCC)    first one was when she was 73 years old    Small bowel obstruction (HCC)    1988 and 2002   Tobacco abuse     Past Surgical History:  Procedure Laterality Date   ABDOMINAL HYSTERECTOMY     BREAST BIOPSY Right 2007   benign inflammatory changes, mass due to underwire bra   BREAST BIOPSY Left 01/02/2016   columnar cell changes without atypical hyperplasia.   BREAST BIOPSY Right 12/06/2015   rt breast mass 10:00, bx done at Dr. Gilberto office, invasive ductal carcinoma   BREAST EXCISIONAL BIOPSY Left 01/02/2016   COLUMNAR CELL CHANGE AND HYPERPLASIA ASSOCIATED WITH LUMINAL AND STROMAL CALCIFICATIONS   BREAST LUMPECTOMY Right 01/02/2016   invasive mammary carcinoma, clear margins, negative LN   BREAST LUMPECTOMY WITH SENTINEL LYMPH NODE BIOPSY Right 01/02/2016   pT1c, N0; ER/ PR 100%; Her 2 neu not over expressed: BREAST LUMPECTOMY WITH SENTINEL LYMPH NODE BX;  Surgeon: Reyes LELON Cota, MD;  Location: [facility] ORS;  Service: General;  Laterality: Right;   CARDIAC CATHETERIZATION     CAROTID PTA/STENT INTERVENTION Right 04/29/2022   Procedure: CAROTID PTA/STENT  INTERVENTION;  Surgeon: Jama Cordella MATSU, MD;  Location: [facility] INVASIVE CV LAB;  Service: Cardiovascular;  Laterality: Right;   CATARACT EXTRACTION Bilateral    COLONOSCOPY  10/2015   Dr Teressa   COLONOSCOPY WITH PROPOFOL  N/A 08/19/2018   Procedure: COLONOSCOPY WITH PROPOFOL ;  Surgeon: Janalyn Keene NOVAK, MD;  Location: [facility] ENDOSCOPY;  Service: Endoscopy;  Laterality: N/A;   ESOPHAGOGASTRODUODENOSCOPY (EGD) WITH PROPOFOL  N/A 08/19/2018   Procedure: ESOPHAGOGASTRODUODENOSCOPY (EGD) WITH PROPOFOL ;  Surgeon: Janalyn Keene NOVAK, MD;  Location: [facility] ENDOSCOPY;   Service: Endoscopy;  Laterality: N/A;   EXPLORATORY LAPAROTOMY  01/25/2001   Exploratory laparotomy, lysis of adhesions, identification of internal hernia secondary to omental adhesion. Prolonged postoperative ileus.   gyn surgery  1993   hysterectomy- form endometriosis   LAPAROSCOPY     PACEMAKER INSERTION  10/24/11   [employer] Advantio dual chamber PPM implanted by Dr Ronny at [facility] in [place]   PPM GENERATOR CHANGEOUT N/A 12/05/2019   Procedure: PPM GENERATOR HERMA;  Surgeon: Fernande Elspeth BROCKS, MD;  Location: [facility] INVASIVE CV LAB;  Service: Cardiovascular;  Laterality: N/A;   RIGHT/LEFT HEART CATH AND CORONARY ANGIOGRAPHY Bilateral 07/19/2019   Procedure: RIGHT/LEFT HEART CATH AND CORONARY ANGIOGRAPHY;  Surgeon: Mady Bruckner, MD;  Location: [facility] INVASIVE CV LAB;  Service: Cardiovascular;  Laterality: Bilateral;   TEMPORARY PACEMAKER N/A 12/05/2019   Procedure: TEMPORARY PACEMAKER;  Surgeon: Fernande Elspeth BROCKS, MD;  Location: [facility] INVASIVE CV LAB;  Service: Cardiovascular;  Laterality: N/A;     Home Medications:  Prior to Admission medications   Medication Sig Start Date End Date Taking? Authorizing Provider  acetaminophen  (TYLENOL ) 500 MG tablet Take 500-1,000 mg by mouth every 6 (six) hours as needed for mild pain or headache.    Yes [provider]  albuterol  (VENTOLIN  HFA) 108 (90 Base) MCG/ACT inhaler INHALE 2 PUFFS BY MOUTH EVERY 4 HOURS AS NEEDED FOR WHEEZING OR SHORTNESS OF BREATH 05/25/23  Yes Tamea Dedra CROME, MD  apixaban  (ELIQUIS ) 5 MG TABS tablet Take 1 tablet (5 mg total) by mouth 2 (two) times daily. 06/17/23  Yes Fernande Elspeth BROCKS, MD  aspirin  EC 81 MG tablet Take 1 tablet (81 mg total) by mouth daily. 12/05/22  Yes Schnier, Cordella MATSU, MD  atorvastatin  (LIPITOR) 40 MG tablet Take 1 tablet by mouth once daily 02/13/23  Yes Fernande Elspeth BROCKS, MD  benzonatate  (TESSALON ) 200 MG capsule Take 1 capsule (200 mg total) by mouth 2 (two) times  daily as needed for cough. 03/03/23  Yes Bedsole, Amy E, MD  Cholecalciferol  (VITAMIN D3) 50 MCG (2000 UT) capsule Take 2,000 Units by mouth daily.   Yes [provider]  fludrocortisone  (FLORINEF ) 0.1 MG tablet Take 1 tablet (0.1 mg total) by mouth 2 (two) times daily. 09/25/23 09/24/24 Yes Furth, Cadence H, PA-C  FLUoxetine  (PROZAC ) 20 MG capsule Take 1 capsule by mouth once daily 06/23/23  Yes Tower, Laine LABOR, MD  gabapentin  (NEURONTIN ) 100 MG capsule Take 200 mg by mouth 2 (two) times daily as needed. 03/02/23  Yes [provider]  levETIRAcetam  (KEPPRA ) 250 MG tablet Take 250 mg by mouth 2 (two) times daily.  11/02/19  Yes [provider]  midodrine  (PROAMATINE ) 10 MG tablet Take 1 tablet (10 mg total) by mouth 3 (three) times daily with meals. 09/11/23  Yes Awanda City, MD  OXYGEN  Inhale 4 L into the lungs continuous.   Yes [provider]  pantoprazole  (PROTONIX ) 40 MG tablet Take 1 tablet (40  mg total) by mouth daily. 11/20/22 11/20/23 Yes Tamea Dedra CROME, MD  potassium chloride  (KLOR-CON  M) 10 MEQ tablet Take 10 mEq by mouth daily. 09/15/23 10/15/23 Yes [provider]  pyridostigmine  (MESTINON ) 60 MG tablet Take 1 tablet (60 mg total) by mouth 3 (three) times daily. 09/25/23  Yes Furth, Cadence H, PA-C  Tiotropium Bromide-Olodaterol (STIOLTO RESPIMAT ) 2.5-2.5 MCG/ACT AERS INHALE 2 PUFFS BY MOUTH ONCE DAILY 05/25/23  Yes Tamea Dedra CROME, MD  zolpidem  (AMBIEN ) 10 MG tablet TAKE 1/2 TO 1 (ONE-HALF TO ONE) TABLET BY MOUTH AT BEDTIME AS NEEDED FOR SLEEP 06/23/23  Yes Tower, Laine LABOR, MD    Scheduled Meds:  apixaban   5 mg Oral BID   atorvastatin   40 mg Oral Daily   fludrocortisone   0.1 mg Oral BID   FLUoxetine   20 mg Oral Daily   levETIRAcetam   250 mg Oral BID   midodrine   10 mg Oral TID WC   pantoprazole   40 mg Oral Daily   potassium chloride   40 mEq Oral Q4H   pyridostigmine   60 mg Oral TID   sodium chloride  flush  3 mL Intravenous Q12H   Continuous  Infusions:  PRN Meds: acetaminophen  **OR** acetaminophen , albuterol , ondansetron  **OR** ondansetron  (ZOFRAN ) IV, zolpidem   Allergies:    Allergies  Allergen Reactions   Codeine Nausea And Vomiting   Morphine  And Codeine Nausea Only    Social History:   Social History   Socioeconomic History   Marital status: Married    Spouse name: Tom   Number of children: Not on file   Years of education: Not on file   Highest education level: Some college, no degree  Occupational History    Employer: RETIRED  Tobacco Use   Smoking status: Former    Current packs/day: 0.00    Average packs/day: 1.5 packs/day for 42.0 years (63.0 ttl pk-yrs)    Types: Cigarettes    Start date: 11/10/1968    Quit date: 11/11/2010    Years since quitting: 12.8   Smokeless tobacco: Never  Vaping Use   Vaping status: Never Used  Substance and Sexual Activity   Alcohol use: Yes    Alcohol/week: 1.0 - 2.0 standard drink of alcohol    Types: 1 - 2 Glasses of wine per week    Comment: weekly   Drug use: No   Sexual activity: Never  Other Topics Concern   Not on file  Social History Narrative   Married 40 years.   Exercises, does walking tapes   Social Drivers of Health   Financial Resource Strain: Low Risk  (05/13/2023)   Overall Financial Resource Strain (CARDIA)    Difficulty of Paying Living Expenses: Not hard at all  Food Insecurity: No Food Insecurity (09/26/2023)   Hunger Vital Sign    Worried About Running Out of Food in the Last Year: Never true    Ran Out of Food in the Last Year: Never true  Transportation Needs: No Transportation Needs (09/26/2023)   PRAPARE - Administrator, Civil Service (Medical): No    Lack of Transportation (Non-Medical): No  Physical Activity: Inactive (05/13/2023)   Exercise Vital Sign    Days of Exercise per Week: 0 days    Minutes of Exercise per Session: 0 min  Stress: No Stress Concern Present (05/13/2023)   Diana Cook [employer] Occupational [employer] -  Occupational Stress Questionnaire    Feeling of Stress : Not at all  Social Connections: Socially Integrated (09/26/2023)   Social  Connection and Isolation Panel    Frequency of Communication with Friends and Family: Three times a week    Frequency of Social Gatherings with Friends and Family: Once a week    Attends Religious Services: More than 4 times per year    Active Member of [employer] or Organizations: Yes    Attends Engineer, structural: More than 4 times per year    Marital Status: Married  Catering manager Violence: Not At Risk (09/26/2023)   Humiliation, Afraid, Rape, and Kick questionnaire    Fear of Current or Ex-Partner: No    Emotionally Abused: No    Physically Abused: No    Sexually Abused: No    Family History:    Family History  Problem Relation Age of Onset   Stroke Mother    Heart disease Mother 5       MI and CABG   Dementia Mother    Coronary artery disease Father    Parkinsonism Father    Breast cancer Sister    Cancer - Cervical Daughter 16       died 04-05-2023   Heart attack Brother 55   Colon cancer Neg Hx      ROS:  Please see the history of present illness.   All other ROS reviewed and negative.     Physical Exam/Data: Vitals:   09/26/23 1100 09/26/23 1300 09/26/23 1400 09/26/23 1525  BP: 134/61 (!) 143/63 (!) 143/68   Pulse: 65 72 71   Resp: (!) 21 (!) 31 16   Temp:    98.3 F (36.8 C)  TempSrc:    Oral  SpO2: 94% 90% 97%   Height:        Intake/Output Summary (Last 24 hours) at 09/26/2023 1526 Last data filed at 09/26/2023 0505 Gross per 24 hour  Intake 1000 ml  Output --  Net 1000 ml      09/23/2023    2:28 PM 09/18/2023   12:12 PM 09/14/2023   10:16 AM  Last 3 Weights  Weight (lbs) 202 lb -- 205 lb 9.6 oz  Weight (kg) 91.627 kg -- 93.26 kg     Body mass index is 30.71 kg/m.  General:  Well nourished, well developed, in no acute distress HEENT: normal Neck: no JVD Vascular: No carotid bruits; Distal pulses 2+  bilaterally Cardiac:  normal S1, S2; RRR; no murmur  Lungs:  clear to auscultation bilaterally, no wheezing, rhonchi or rales  Abd: soft, nontender, no hepatomegaly  Ext: no edema Musculoskeletal:  No deformities, BUE and BLE strength normal and equal Skin: warm and dry  Neuro:  CNs 2-12 intact, no focal abnormalities noted Psych:  Normal affect   EKG:  The EKG was personally reviewed and demonstrates: AV paced rhythm Telemetry:  Telemetry was personally reviewed and demonstrates: V paced rhythm  Relevant CV Studies: Echo 6/25 EF 55 to 60%  Laboratory Data: High Sensitivity Troponin:   Recent Labs  Lab 08/28/23 1544 08/28/23 2125 09/14/23 1129 09/25/23 2305  TROPONINIHS 11 11 14  81*     Chemistry Recent Labs  Lab 09/25/23 2305 09/26/23 0520  NA 136 138  K 2.6* 2.9*  CL 97* 102  CO2 27 27  GLUCOSE 130* 119*  BUN 16 13  CREATININE 0.91 0.64  CALCIUM  9.3 8.6*  MG  --  1.7  GFRNONAA >60 >60  ANIONGAP 12 9    Recent Labs  Lab 09/25/23 2305  PROT 7.8  ALBUMIN 3.9  AST 17  ALT  12  ALKPHOS 83  BILITOT 0.9   Lipids No results for input(s): CHOL, TRIG, HDL, LABVLDL, LDLCALC, CHOLHDL in the last 168 hours.  Hematology Recent Labs  Lab 09/25/23 2305 09/26/23 0520  WBC 10.2 8.5  RBC 3.51* 3.27*  HGB 10.1* 9.4*  HCT 31.2* 29.6*  MCV 88.9 90.5  MCH 28.8 28.7  MCHC 32.4 31.8  RDW 12.9 12.9  PLT 382 324   Thyroid  No results for input(s): TSH, FREET4 in the last 168 hours.  BNPNo results for input(s): BNP, PROBNP in the last 168 hours.  DDimer No results for input(s): DDIMER in the last 168 hours.  Radiology/Studies:  No results found.   Assessment and Plan: Orthostatic hypotension, likely cause for syncope. - Mestinon  60 mg 3 times daily started. - Continue midodrine  10 mg 3 times daily, Florinef  0.1 mg twice daily - Ambulated with PT, monitor for symptoms. - Anticipate discharge later today or tomorrow if patient's symptoms  improved.  2.  Complete heart block s/p pacemaker - Appears to be working normally on last interrogation.  3.  Paroxysmal atrial fibrillation - Continue Eliquis  5 mg twice daily   Signed, Redell Cave, MD  09/26/2023 3:26 PM

## 2023-09-26 NOTE — Assessment & Plan Note (Signed)
 S/p surgery/RT  no acute issues suspected

## 2023-09-26 NOTE — ED Notes (Signed)
 Called CCMD to monitor

## 2023-09-26 NOTE — Assessment & Plan Note (Signed)
 Right ankle pain and mild swelling after the fall. - DG right ankle ordered

## 2023-09-27 DIAGNOSIS — I951 Orthostatic hypotension: Secondary | ICD-10-CM | POA: Diagnosis not present

## 2023-09-27 DIAGNOSIS — Z950 Presence of cardiac pacemaker: Secondary | ICD-10-CM | POA: Diagnosis not present

## 2023-09-27 DIAGNOSIS — R55 Syncope and collapse: Secondary | ICD-10-CM | POA: Diagnosis not present

## 2023-09-27 LAB — GLUCOSE, CAPILLARY: Glucose-Capillary: 107 mg/dL — ABNORMAL HIGH (ref 70–99)

## 2023-09-27 MED ORDER — KETOROLAC TROMETHAMINE 15 MG/ML IJ SOLN
15.0000 mg | Freq: Four times a day (QID) | INTRAMUSCULAR | Status: DC | PRN
  Administered 2023-09-27 – 2023-09-29 (×3): 15 mg via INTRAVENOUS
  Filled 2023-09-27 (×3): qty 1

## 2023-09-27 MED ORDER — MIDODRINE HCL 5 MG PO TABS
5.0000 mg | ORAL_TABLET | Freq: Three times a day (TID) | ORAL | Status: DC
  Administered 2023-09-27 – 2023-10-02 (×13): 5 mg via ORAL
  Filled 2023-09-27 (×14): qty 1

## 2023-09-27 NOTE — Evaluation (Signed)
 Physical Therapy Evaluation Patient Details Name: Diana Cook MRN: [id number] DOB: 1950-10-27 Today's Date: 09/27/2023  History of Present Illness  Diana Cook is a 73 y.o. female with medical history significant for COPD on 4 L O2 via [place], CAD/MI, CHB s/p PPM, carotid stenosis s/p stents on Eliquis , seizures, lumbar radiculopathy, OA, breast cancer s/p surgery/RT, HFpEF, and severe orthostatic hypotension for which she was hospitalized a couple weeks prior with a subsequent ED visit at [facility] who is being admitted for syncopal event.  Patient has several brief syncopal events related to orthostatic hypotension usually resolved by lying down however on the night of arrival she had an prolonged syncopal episode that was slow to improve with lying flat.   Clinical Impression  Patient received in bed, recently here with same issue regarding orthostatic hypotension. Patient has R ankle pain and swelling due to falling. No fracture. She does not have TED hose or abdominal binder here. TED hose donned and LEs wrapped with ace bandages prior to mobility. Patient continues to have some BP dropping with supine to sit, but none with sit to stand. She was able to get into recliner and side step at edge of bed. Patient will continue to benefit from skilled PT to improve independence and safety with mobility.          If plan is discharge home, recommend the following: A little help with walking and/or transfers;A little help with bathing/dressing/bathroom;Assist for transportation;Help with stairs or ramp for entrance;Assistance with cooking/housework   Can travel by private vehicle    yes    Equipment Recommendations None recommended by PT  Recommendations for Other Services       Functional Status Assessment Patient has had a recent decline in their functional status and demonstrates the ability to make significant improvements in function in a reasonable and predictable amount of time.      Precautions / Restrictions Precautions Precautions: Fall Recall of Precautions/Restrictions: Intact Precaution/Restrictions Comments: MONITOR ORTHOSTATICS Other Brace: abdominal binder and ted hose- patient left abdominal binder at home when she was admitted this time. May need another order for one unless her spouse can bring Restrictions Weight Bearing Restrictions Per Provider Order: No      Mobility  Bed Mobility Overal bed mobility: Modified Independent                  Transfers Overall transfer level: Needs assistance Equipment used: Rolling walker (2 wheels) Transfers: Sit to/from Stand, Bed to chair/wheelchair/BSC Sit to Stand: Contact guard assist, Supervision   Step pivot transfers: Contact guard assist            Ambulation/Gait Ambulation/Gait assistance: Contact guard assist Gait Distance (Feet): 3 Feet Assistive device: Rolling walker (2 wheels) Gait Pattern/deviations: Step-to pattern, Decreased step length - right, Decreased step length - left Gait velocity: decreased     General Gait Details: patient denies feeling lightheaded or dizzy with standing or stepping over to recliner. R ankle discomfort with mobility due to injury  Stairs            Wheelchair Mobility     Tilt Bed    Modified Rankin (Stroke Patients Only)       Balance Overall balance assessment: Needs assistance Sitting-balance support: Feet supported Sitting balance-Leahy Scale: Normal     Standing balance support: Bilateral upper extremity supported, During functional activity, Reliant on assistive device for balance Standing balance-Leahy Scale: Good  Pertinent Vitals/Pain Pain Assessment Pain Assessment: Faces Faces Pain Scale: Hurts little more Pain Location: R ankle since fall Pain Descriptors / Indicators: Discomfort, Sore, Grimacing Pain Intervention(s): Monitored during session, Repositioned, Premedicated  before session, Other (comment) (TED hose donned and ACE bandages wrapped on LEs)    Home Living Family/patient expects to be discharged to:: Private residence Living Arrangements: Spouse/significant other Available Help at Discharge: Family;Available 24 hours/day Type of Home: House Home Access: Level entry       Home Layout: One level Home Equipment: Rollator (4 wheels);Grab bars - tub/shower;Shower Counsellor (2 wheels);Wheelchair - manual Additional Comments: 4L O2 at home    Prior Function               Mobility Comments: patient has not been ambulating since last admission. Has wheelchair that she transfers to/from ADLs Comments: Independent with basic ADLs, husband can assist as needed     Extremity/Trunk Assessment   Upper Extremity Assessment Upper Extremity Assessment: Defer to OT evaluation    Lower Extremity Assessment Lower Extremity Assessment: Generalized weakness;Overall [facility] for tasks assessed    Cervical / Trunk Assessment Cervical / Trunk Assessment: Normal  Communication   Communication Communication: No apparent difficulties    Cognition Arousal: Alert Behavior During Therapy: WFL for tasks assessed/performed   PT - Cognitive impairments: No apparent impairments                       PT - Cognition Comments: A&Ox4 Following commands: Intact       Cueing Cueing Techniques: Verbal cues     General Comments      Exercises     Assessment/Plan    PT Assessment Patient needs continued PT services  PT Problem List Decreased strength;Decreased activity tolerance;Decreased balance;Decreased mobility;Pain;Cardiopulmonary status limiting activity       PT Treatment Interventions DME instruction;Gait training;Stair training;Functional mobility training;Therapeutic activities;Therapeutic exercise;Patient/family education    PT Goals (Current goals can be found in the Care Plan section)  Acute Rehab PT Goals Patient  Stated Goal: improve PT Goal Formulation: With patient Time For Goal Achievement: 10/11/23 Potential to Achieve Goals: Fair    Frequency Min 2X/week     Co-evaluation PT/OT/SLP Co-Evaluation/Treatment: Yes Reason for Co-Treatment: For patient/therapist safety;To address functional/ADL transfers PT goals addressed during session: Mobility/safety with mobility;Balance         AM-PAC PT 6 Clicks Mobility  Outcome Measure Help needed turning from your back to your side while in a flat bed without using bedrails?: None Help needed moving from lying on your back to sitting on the side of a flat bed without using bedrails?: None Help needed moving to and from a bed to a chair (including a wheelchair)?: A Little Help needed standing up from a chair using your arms (e.g., wheelchair or bedside chair)?: A Little Help needed to walk in hospital room?: A Little Help needed climbing 3-5 steps with a railing? : A Lot 6 Click Score: 19    End of Session Equipment Utilized During Treatment: Oxygen  Activity Tolerance: Patient tolerated treatment well;Treatment limited secondary to medical complications (Comment);Patient limited by pain Patient left: in chair;with call bell/phone within reach;with nursing/sitter in room Nurse Communication: Mobility status;Other (comment) (ACE wraps on legs) PT Visit Diagnosis: Other abnormalities of gait and mobility (R26.89);Repeated falls (R29.6);Difficulty in walking, not elsewhere classified (R26.2);Dizziness and giddiness (R42);Pain Pain - Right/Left: Right Pain - part of body: Ankle and joints of foot    Time: 0940-1004 PT  Time Calculation (min) (ACUTE ONLY): 24 min   Charges:   PT Evaluation $PT Eval Moderate Complexity: 1 Mod   PT General Charges $$ ACUTE PT VISIT: 1 Visit        Marvin Maenza, PT, GCS 09/27/23,10:46 AM

## 2023-09-27 NOTE — Plan of Care (Signed)
  Problem: Clinical Measurements: Goal: Will remain free from infection Outcome: Progressing Goal: Cardiovascular complication will be avoided Outcome: Progressing   Problem: Activity: Goal: Risk for activity intolerance will decrease Outcome: Progressing   Problem: Nutrition: Goal: Adequate nutrition will be maintained Outcome: Progressing   Problem: Coping: Goal: Level of anxiety will decrease Outcome: Progressing   Problem: Elimination: Goal: Will not experience complications related to urinary retention Outcome: Progressing   Problem: Pain Managment: Goal: General experience of comfort will improve and/or be controlled Outcome: Progressing   Problem: Safety: Goal: Ability to remain free from injury will improve Outcome: Progressing

## 2023-09-27 NOTE — Evaluation (Signed)
 Occupational Therapy Evaluation Patient Details Name: Diana Cook MRN: [id number] DOB: 1950-12-18 Today's Date: 09/27/2023   History of Present Illness   Karson C Rachels is a 73 y.o. female with medical history significant for COPD on 4 L O2 via [place], CAD/MI, CHB s/p PPM, carotid stenosis s/p stents on Eliquis , seizures, lumbar radiculopathy, OA, breast cancer s/p surgery/RT, HFpEF, and severe orthostatic hypotension for which she was hospitalized a couple weeks prior with a subsequent ED visit at [facility] who is being admitted for syncopal event.  Patient has several brief syncopal events related to orthostatic hypotension usually resolved by lying down however on the night of arrival she had an prolonged syncopal episode that was slow to improve with lying flat.     Clinical Impressions Upon entering the room, pt supine in bed and agreeable to OT intervention. B thigh high TED hose donned with ACE wrap placed over top for BP management. Abdominal binder not available in room to utilize this session. See BP results at bottom of this section. Pt reports living at home with husband who is able to assist as needed at discharge. She has been using wheelchair for safety in order to get around in home secondary to fear of syncope. Pt able to stand with min guard and take several steps towards the L to sit into recliner chair. She also endorses ACE wrap feeling better on R ankle as well. Pt remains seated in recliner chair at end of session with nurse present in room. Call bell and all needed items within reach.  Supine without anything on: 129/57 Seated EOB with TEDs and wraps: 96/49 After standing for several minutes: 108/46     If plan is discharge home, recommend the following:   A little help with walking and/or transfers;A little help with bathing/dressing/bathroom;Assistance with cooking/housework;Assist for transportation;Help with stairs or ramp for entrance     Functional Status  Assessment   Patient has had a recent decline in their functional status and demonstrates the ability to make significant improvements in function in a reasonable and predictable amount of time.     Equipment Recommendations   None recommended by OT      Precautions/Restrictions   Precautions Precautions: Fall Recall of Precautions/Restrictions: Intact Precaution/Restrictions Comments: MONITOR ORTHOSTATICS Other Brace: abdominal binder and ted hose- patient left abdominal binder at home when she was admitted this time. May need another order for one unless her spouse can bring     Mobility Bed Mobility Overal bed mobility: Modified Independent                  Transfers Overall transfer level: Needs assistance Equipment used: Rolling walker (2 wheels) Transfers: Sit to/from Stand, Bed to chair/wheelchair/BSC Sit to Stand: Contact guard assist, Supervision     Step pivot transfers: Contact guard assist            Balance Overall balance assessment: Needs assistance Sitting-balance support: Feet supported Sitting balance-Leahy Scale: Normal Sitting balance - Comments: steady sitting, reaching outside BOS   Standing balance support: Bilateral upper extremity supported, During functional activity, Reliant on assistive device for balance Standing balance-Leahy Scale: Good                             ADL either performed or assessed with clinical judgement   ADL Overall ADL's : Needs assistance/impaired  Lower Body Dressing: Moderate assistance Lower Body Dressing Details (indicate cue type and reason): assistance to don socks secondary to R ankle pain                     Vision Patient Visual Report: No change from baseline              Pertinent Vitals/Pain Pain Assessment Pain Assessment: Faces Faces Pain Scale: Hurts little more Pain Location: R ankle since fall Pain Descriptors / Indicators:  Discomfort, Sore, Grimacing Pain Intervention(s): Monitored during session, Premedicated before session, Other (comment) (thigh high TEDs and B LEs ACE wraps for BP management)     Extremity/Trunk Assessment Upper Extremity Assessment Upper Extremity Assessment: Generalized weakness   Lower Extremity Assessment Lower Extremity Assessment: Generalized weakness   Cervical / Trunk Assessment Cervical / Trunk Assessment: Normal   Communication Communication Communication: No apparent difficulties   Cognition Arousal: Alert Behavior During Therapy: WFL for tasks assessed/performed Cognition: No apparent impairments                               Following commands: Intact       Cueing  General Comments   Cueing Techniques: Verbal cues              Home Living Family/patient expects to be discharged to:: Private residence Living Arrangements: Spouse/significant other Available Help at Discharge: Family;Available 24 hours/day Type of Home: House Home Access: Level entry     Home Layout: One level     Bathroom Shower/Tub: Producer, television/film/video: Handicapped height     Home Equipment: Rollator (4 wheels);Grab bars - tub/shower;Shower Counsellor (2 wheels);Wheelchair - manual   Additional Comments: 4L O2 at home      Prior Functioning/Environment Prior Level of Function : Driving;History of Falls (last six months);Independent/Modified Independent             Mobility Comments: patient has not been ambulating since last admission. Has wheelchair that she transfers to/from ADLs Comments: Independent with basic ADLs, husband can assist as needed    OT Problem List: Decreased strength;Pain;Cardiopulmonary status limiting activity;Impaired balance (sitting and/or standing);Decreased knowledge of use of DME or AE;Impaired UE functional use   OT Treatment/Interventions: Self-care/ADL training;Therapeutic exercise;Therapeutic  activities;DME and/or AE instruction;Energy conservation;Patient/family education;Balance training      OT Goals(Current goals can be found in the care plan section)   Acute Rehab OT Goals Patient Stated Goal: to get better OT Goal Formulation: With patient Time For Goal Achievement: 09/12/23 Potential to Achieve Goals: Fair ADL Goals Pt Will Perform Grooming: with modified independence;sitting Pt Will Perform Lower Body Dressing: sit to/from stand;with modified independence Pt Will Transfer to Toilet: with supervision;ambulating Pt Will Perform Toileting - Clothing Manipulation and hygiene: with supervision;sit to/from stand   OT Frequency:  Min 2X/week    Co-evaluation PT/OT/SLP Co-Evaluation/Treatment: Yes Reason for Co-Treatment: For patient/therapist safety;To address functional/ADL transfers PT goals addressed during session: Mobility/safety with mobility;Balance OT goals addressed during session: ADL's and self-care      AM-PAC OT 6 Clicks Daily Activity     Outcome Measure Help from another person eating meals?: None Help from another person taking care of personal grooming?: A Little Help from another person toileting, which includes using toliet, bedpan, or urinal?: A Little Help from another person bathing (including washing, rinsing, drying)?: A Little Help from another person to put on and taking off  regular upper body clothing?: A Little Help from another person to put on and taking off regular lower body clothing?: A Little 6 Click Score: 19   End of Session Equipment Utilized During Treatment: Rolling walker (2 wheels);Oxygen  Nurse Communication: Mobility status  Activity Tolerance: Patient tolerated treatment well Patient left: in chair;with call bell/phone within reach;with nursing/sitter in room  OT Visit Diagnosis: Other abnormalities of gait and mobility (R26.89);Repeated falls (R29.6)                Time: 9059-8995 OT Time Calculation (min): 24  min Charges:  OT General Charges $OT Visit: 1 Visit OT Evaluation $OT Eval Low Complexity: 1 Low  Izetta Claude, MS, OTR/L , CBIS ascom [phone]  09/27/23, 1:39 PM

## 2023-09-27 NOTE — Progress Notes (Signed)
 Progress Note   Patient: Diana Cook FMW:[id number] DOB: 1951/01/26 DOA: 09/25/2023     0 DOS: the patient was seen and examined on 09/27/2023   Brief hospital course:   Assessment and Plan: * Syncope and collapse History of severe orthostatic hypotension, with recurrent syncope Suspect related to orthostatic hypotension.  Seizure not suspected Continue midodrine  10 mg 3 times daily Recently restarted on fludrocortisone  0.1 mg twice daily([facility]) Continue pyridostigmine -a new prescription which she was not started yet and is being started as inpatient Continue abdominal binder Advised to hydrate 3 L fluid daily([facility]) Cardiology was consulted and they have started the patient on pyridostigmine . Will check orthostatics when the patient is able to stand on her right foot. PT and OT evaluation ordered  Right foot pain: The patient continues to complain of right foot pain. CT of the right lower extremity and foot demonstrates likely ligamentous injury. Orthopedic surgery has been consulted. They have diagnosed her with an acute right ankle sprain and recommended a cam boot and non-surgical management. She will be mobilized with physical therapy and weight bearing as tolerated on her foot once a cam walker boot or aircast splint has been placed. She may need a walker or cane for support and balance while she heals.  Elevated troponin CAD with history of MI Suspect secondary to syncopal event EKG nonacute and patient does not have chest pain Continue aspirin  and atorvastatin  Cardiology is on board  Hypokalemia Potassium with some improvement to 2.9 with magnesium  of 1.7 Takes potassium supplements at baseline.  Recently prescribed by [facility] -Replete potassium and magnesium  -Continue to monitor  Complete heart block s/p pacemaker (HCC) No acute issues suspected Interrogated on 7/22 by cardiologist at [facility] HR limit increased  Carotid stenosis s/p stents Chronic  anticoagulation No acute issues suspected Continue Eliquis   Seizures (HCC) Last seizure 6 years ago Acute seizure not suspected at this time Continue Keppra   Chronic heart failure with preserved ejection fraction (HFpEF) (HCC) Clinically euvolemic  Chronic obstructive pulmonary disease (HCC) Chronic respiratory failure with hypoxia on 4 L home O2 Not acutely exacerbated Nebs as needed Continue supplemental oxygen   History of breast cancer S/p surgery/RT  no acute issues suspected  Subjective: Patient was seen and examined today.  Denies any chest pain or dizziness while lying down.  Longstanding history of postural hypotension and syncopal episodes.  Apparently this recent episode was lasted much longer than prior episodes. Pain around right ankle since fall.  Physical Exam: Vitals:   09/27/23 0438 09/27/23 0444 09/27/23 0735 09/27/23 1528  BP: (!) 132/59  128/62 (!) 174/74  Pulse: 71  72 63  Resp: 20  17 17   Temp: 98.2 F (36.8 C)  98.2 F (36.8 C) 98.2 F (36.8 C)  TempSrc:   Oral Oral  SpO2: 93%  92% 92%  Weight:  92.5 kg    Height:       Exam:  Constitutional:  The patient is awake, alert, and oriented x 3. No acute distress. Eyes:  pupils and irises appear normal Normal lids and conjunctivae ENMT:  grossly normal hearing  Lips appear normal external ears, nose appear normal Oropharynx: mucosa, tongue,posterior pharynx appear normal Neck:  neck appears normal, no masses, normal ROM, supple no thyromegaly Respiratory:  No increased work of breathing. No wheezes, rales, or rhonchi No tactile fremitus Cardiovascular:  Regular rate and rhythm No murmurs, ectopy, or gallups. No lateral PMI. No thrills. Abdomen:  Abdomen is soft, non-tender, non-distended No hernias, masses, or organomegaly  Normoactive bowel sounds.  Musculoskeletal:  No cyanosis, clubbing, or edema Skin:  No rashes, lesions, ulcers palpation of skin: no induration or  nodules Neurologic:  CN 2-12 intact Sensation all 4 extremities intact Psychiatric:  Mental status Mood, affect appropriate Orientation to person, place, time  judgment and insight appear intact  Data Reviewed: Prior data reviewed  Family Communication: Discussed with husband at bedside  Disposition: Status is: Observation The patient remains OBS appropriate and will d/c before 2 midnights.  Planned Discharge Destination: Home  DVT prophylaxis.  Eliquis  Time spent:  minutes  This record has been created using Conservation officer, historic buildings. Errors have been sought and corrected,but may not always be located. Such creation errors do not reflect on the standard of care.   Author: Makhai Fulco, DO 09/27/2023 4:39 PM  For on call review [email or web address].

## 2023-09-27 NOTE — Progress Notes (Signed)
 Rounding Note   Patient Name: Diana Cook Date of Encounter: 09/27/2023   [facility] Cardiologist: Lonni Hanson, MD   Subjective Doing okay, feels much better this morning.  Was able to get out of bed to chair without any dizzy spell.  Scheduled Meds:  apixaban   5 mg Oral BID   atorvastatin   40 mg Oral Daily   diclofenac  Sodium  4 g Topical QID   fludrocortisone   0.1 mg Oral BID   FLUoxetine   20 mg Oral Daily   levETIRAcetam   250 mg Oral BID   midodrine   10 mg Oral TID WC   pantoprazole   40 mg Oral Daily   pyridostigmine   60 mg Oral TID   sodium chloride  flush  3 mL Intravenous Q12H   Continuous Infusions:  PRN Meds: acetaminophen  **OR** acetaminophen , albuterol , ketorolac , ondansetron  **OR** ondansetron  (ZOFRAN ) IV, traMADol , zolpidem    Vital Signs  Vitals:   09/26/23 2305 09/27/23 0438 09/27/23 0444 09/27/23 0735  BP:  (!) 132/59  128/62  Pulse:  71  72  Resp:  20  17  Temp:  98.2 F (36.8 C)  98.2 F (36.8 C)  TempSrc:    Oral  SpO2:  93%  92%  Weight: 92.5 kg  92.5 kg   Height:        Intake/Output Summary (Last 24 hours) at 09/27/2023 1339 Last data filed at 09/27/2023 0900 Gross per 24 hour  Intake 530 ml  Output 400 ml  Net 130 ml      09/27/2023    4:44 AM 09/26/2023   11:05 PM 09/23/2023    2:28 PM  Last 3 Weights  Weight (lbs) 203 lb 14.8 oz 204 lb 202 lb  Weight (kg) 92.5 kg 92.534 kg 91.627 kg      Telemetry Paced rhythm heart rate 68- Personally Reviewed    Physical Exam  GEN: No acute distress.   Neck: No JVD Cardiac: RRR, no murmurs, rubs, or gallops.  Respiratory: Clear to auscultation bilaterally. GI: Soft, nontender, non-distended  MS: No edema; No deformity. Neuro:  Nonfocal  Psych: Normal affect   Labs High Sensitivity Troponin:   Recent Labs  Lab 08/28/23 1544 08/28/23 2125 09/14/23 1129 09/25/23 2305  TROPONINIHS 11 11 14  81*     Chemistry Recent Labs  Lab 09/25/23 2305 09/26/23 0520  NA 136 138   K 2.6* 2.9*  CL 97* 102  CO2 27 27  GLUCOSE 130* 119*  BUN 16 13  CREATININE 0.91 0.64  CALCIUM  9.3 8.6*  MG  --  1.7  PROT 7.8  --   ALBUMIN 3.9  --   AST 17  --   ALT 12  --   ALKPHOS 83  --   BILITOT 0.9  --   GFRNONAA >60 >60  ANIONGAP 12 9    Lipids No results for input(s): CHOL, TRIG, HDL, LABVLDL, LDLCALC, CHOLHDL in the last 168 hours.  Hematology Recent Labs  Lab 09/25/23 2305 09/26/23 0520  WBC 10.2 8.5  RBC 3.51* 3.27*  HGB 10.1* 9.4*  HCT 31.2* 29.6*  MCV 88.9 90.5  MCH 28.8 28.7  MCHC 32.4 31.8  RDW 12.9 12.9  PLT 382 324   Thyroid  No results for input(s): TSH, FREET4 in the last 168 hours.  BNPNo results for input(s): BNP, PROBNP in the last 168 hours.  DDimer No results for input(s): DDIMER in the last 168 hours.   Radiology  DG Ankle Complete Right Result Date: 09/26/2023 CLINICAL DATA:  Pain.  Fall. EXAM: RIGHT ANKLE - COMPLETE 3+ VIEW COMPARISON:  None Available. FINDINGS: Punctate linear foci of mineralization adjacent to the tip of the medial malleolus are suspicious for ligamentous injury. The remainder of the visualized bones appear intact. No malalignment. Ankle mortise is congruent. Joint spaces are relatively maintained. Soft tissue swelling along the medial ankle. IMPRESSION: Punctate linear foci of mineralization adjacent to the tip of the medial malleolus are suspicious for ligamentous injury. Overlying soft tissue swelling. Electronically Signed   By: Harrietta Sherry M.D.   On: 09/26/2023 16:22    Cardiac Studies Echo 6/25 EF 55 to 60%   Patient Profile   73 y.o. female with a hx of nonobstructive CAD, complete heart block s/p PPM who is being seen 09/26/2023 for the evaluation of orthostatic hypotension   Assessment & Plan   Orthostatic hypotension, likely cause for syncope. -States feeling better today. - Continue Mestinon  60 mg 3 times daily.  Ambulation, PT advised. - Continue midodrine  10 mg 3 times daily,  Florinef  0.1 mg twice daily - Okay for discharge later today if patient back to baseline and no significant symptoms. - Already has home PT scheduled from last/recent admission.   2.  Complete heart block s/p pacemaker -Telemetry showing V paced rhythm heart rate 68   3.  Paroxysmal atrial fibrillation - Continue Eliquis  5 mg twice daily    Signed, Redell Cave, MD  09/27/2023, 1:39 PM

## 2023-09-27 NOTE — Consult Note (Signed)
 ORTHOPAEDIC CONSULTATION  REQUESTING PHYSICIAN: Swayze, Ava, DO  Chief Complaint:   Right ankle pain.  History of Present Illness: Diana Cook is a 73 y.o. female with multiple medical problems including COPD, coronary artery disease status post prior MI, seizure disorder, breast cancer, and severe orthostatic hypotension who was admitted early yesterday morning after sustaining an apparent syncopal event related to her orthostatic hypotension issues.  Because of a delayed recovery after lying flat for 15 minutes, the patient was brought to the emergency room and subsequently admitted for further workup.  Apparently, the patient complained of right ankle pain and swelling upon admission which has interfered with her ability to mobilize with physical therapy.  Therefore, x-rays of her right ankle were obtained and orthopedic consultation requested.  The patient believes that she may have rolled the ankle when she fell.  She denies any numbness or paresthesias to her foot, denies any prior issues with her ankle.  Past Medical History:  Diagnosis Date   Allergic rhinitis    Arthritis    Asthma    as a child, mild now   Breast cancer (HCC) 12/2015   right breast cancer, lumpectomy and mammosite    Colon polyps    colonoscopy 7/08, tubular adenoma   Complete heart block (HCC) 09/2011   s/p PPM implanted in [facility]   COPD (chronic obstructive pulmonary disease) (HCC)    Myocardial infarction (HCC) 2011   Osteopenia 10/2015   Pacemaker    2011   Paroxysmal atrial fibrillation (HCC) 03/2021   Incidentally detected on pacemaker interrogation   Personal history of radiation therapy 2017   right breast ca, mammosite placed   Rotator cuff tear, right 02/2022   Had an MRI in [place] I have 2 tears.   Seizure disorder (HCC)    Seizures (HCC)    first one was when she was 73 years old    Small bowel obstruction (HCC)     1988 and 2002   Tobacco abuse    Past Surgical History:  Procedure Laterality Date   ABDOMINAL HYSTERECTOMY     BREAST BIOPSY Right 2007   benign inflammatory changes, mass due to underwire bra   BREAST BIOPSY Left 01/02/2016   columnar cell changes without atypical hyperplasia.   BREAST BIOPSY Right 12/06/2015   rt breast mass 10:00, bx done at Dr. Gilberto office, invasive ductal carcinoma   BREAST EXCISIONAL BIOPSY Left 01/02/2016   COLUMNAR CELL CHANGE AND HYPERPLASIA ASSOCIATED WITH LUMINAL AND STROMAL CALCIFICATIONS   BREAST LUMPECTOMY Right 01/02/2016   invasive mammary carcinoma, clear margins, negative LN   BREAST LUMPECTOMY WITH SENTINEL LYMPH NODE BIOPSY Right 01/02/2016   pT1c, N0; ER/ PR 100%; Her 2 neu not over expressed: BREAST LUMPECTOMY WITH SENTINEL LYMPH NODE BX;  Surgeon: Reyes LELON Cota, MD;  Location: [facility] ORS;  Service: General;  Laterality: Right;   CARDIAC CATHETERIZATION     CAROTID PTA/STENT INTERVENTION Right 04/29/2022   Procedure: CAROTID PTA/STENT INTERVENTION;  Surgeon: Jama Cordella MATSU, MD;  Location: [facility] INVASIVE CV LAB;  Service: Cardiovascular;  Laterality: Right;   CATARACT EXTRACTION Bilateral    COLONOSCOPY  10/2015   Dr Teressa   COLONOSCOPY WITH PROPOFOL  N/A 08/19/2018   Procedure: COLONOSCOPY WITH PROPOFOL ;  Surgeon: Janalyn Keene NOVAK, MD;  Location: [facility] ENDOSCOPY;  Service: Endoscopy;  Laterality: N/A;   ESOPHAGOGASTRODUODENOSCOPY (EGD) WITH PROPOFOL  N/A 08/19/2018   Procedure: ESOPHAGOGASTRODUODENOSCOPY (EGD) WITH PROPOFOL ;  Surgeon: Janalyn Keene NOVAK, MD;  Location: [facility] ENDOSCOPY;  Service: Endoscopy;  Laterality: N/A;   EXPLORATORY LAPAROTOMY  01/25/2001   Exploratory laparotomy, lysis of adhesions, identification of internal hernia secondary to omental adhesion. Prolonged postoperative ileus.   gyn surgery  1993   hysterectomy- form endometriosis   LAPAROSCOPY     PACEMAKER INSERTION  10/24/11   [employer] Advantio dual  chamber PPM implanted by Dr Ronny at [facility] in [place]   PPM GENERATOR CHANGEOUT N/A 12/05/2019   Procedure: PPM GENERATOR HERMA;  Surgeon: Fernande Elspeth BROCKS, MD;  Location: [facility] INVASIVE CV LAB;  Service: Cardiovascular;  Laterality: N/A;   RIGHT/LEFT HEART CATH AND CORONARY ANGIOGRAPHY Bilateral 07/19/2019   Procedure: RIGHT/LEFT HEART CATH AND CORONARY ANGIOGRAPHY;  Surgeon: Mady Bruckner, MD;  Location: [facility] INVASIVE CV LAB;  Service: Cardiovascular;  Laterality: Bilateral;   TEMPORARY PACEMAKER N/A 12/05/2019   Procedure: TEMPORARY PACEMAKER;  Surgeon: Fernande Elspeth BROCKS, MD;  Location: [facility] INVASIVE CV LAB;  Service: Cardiovascular;  Laterality: N/A;   Social History   Socioeconomic History   Marital status: Married    Spouse name: Tom   Number of children: Not on file   Years of education: Not on file   Highest education level: Some college, no degree  Occupational History    Employer: RETIRED  Tobacco Use   Smoking status: Former    Current packs/day: 0.00    Average packs/day: 1.5 packs/day for 42.0 years (63.0 ttl pk-yrs)    Types: Cigarettes    Start date: 11/10/1968    Quit date: 11/11/2010    Years since quitting: 12.8   Smokeless tobacco: Never  Vaping Use   Vaping status: Never Used  Substance and Sexual Activity   Alcohol use: Yes    Alcohol/week: 1.0 - 2.0 standard drink of alcohol    Types: 1 - 2 Glasses of wine per week    Comment: weekly   Drug use: No   Sexual activity: Never  Other Topics Concern   Not on file  Social History Narrative   Married 40 years.   Exercises, does walking tapes   Social Drivers of Health   Financial Resource Strain: Low Risk  (05/13/2023)   Overall Financial Resource Strain (CARDIA)    Difficulty of Paying Living Expenses: Not hard at all  Food Insecurity: No Food Insecurity (09/26/2023)   Hunger Vital Sign    Worried About Running Out of Food in the Last Year: Never true    Ran Out of Food in the  Last Year: Never true  Transportation Needs: No Transportation Needs (09/26/2023)   PRAPARE - Administrator, Civil Service (Medical): No    Lack of Transportation (Non-Medical): No  Physical Activity: Inactive (05/13/2023)   Exercise Vital Sign    Days of Exercise per Week: 0 days    Minutes of Exercise per Session: 0 min  Stress: No Stress Concern Present (05/13/2023)   [employer] of Occupational Health - Occupational Stress Questionnaire    Feeling of Stress : Not at all  Social Connections: Socially Integrated (09/26/2023)   Social Connection and Isolation Panel    Frequency of Communication with Friends and Family: Three times a week    Frequency of Social Gatherings with Friends and Family: Once a week    Attends Religious Services: More than 4 times per year    Active Member of [employer] or Organizations: Yes    Attends Banker Meetings: More than 4 times per year    Marital Status:  Married   Family History  Problem Relation Age of Onset   Stroke Mother    Heart disease Mother 51       MI and CABG   Dementia Mother    Coronary artery disease Father    Parkinsonism Father    Breast cancer Sister    Cancer - Cervical Daughter 60       died Mar 21, 2023   Heart attack Brother 60   Colon cancer Neg Hx    Allergies  Allergen Reactions   Codeine Nausea And Vomiting   Morphine  And Codeine Nausea Only   Prior to Admission medications   Medication Sig Start Date End Date Taking? Authorizing Provider  acetaminophen  (TYLENOL ) 500 MG tablet Take 500-1,000 mg by mouth every 6 (six) hours as needed for mild pain or headache.    Yes [provider]  albuterol  (VENTOLIN  HFA) 108 (90 Base) MCG/ACT inhaler INHALE 2 PUFFS BY MOUTH EVERY 4 HOURS AS NEEDED FOR WHEEZING OR SHORTNESS OF BREATH 05/25/23  Yes Tamea Dedra CROME, MD  apixaban  (ELIQUIS ) 5 MG TABS tablet Take 1 tablet (5 mg total) by mouth 2 (two) times daily. 06/17/23  Yes Fernande Elspeth BROCKS, MD  aspirin  EC  81 MG tablet Take 1 tablet (81 mg total) by mouth daily. 12/05/22  Yes Schnier, Cordella MATSU, MD  atorvastatin  (LIPITOR) 40 MG tablet Take 1 tablet by mouth once daily 02/13/23  Yes Fernande Elspeth BROCKS, MD  benzonatate  (TESSALON ) 200 MG capsule Take 1 capsule (200 mg total) by mouth 2 (two) times daily as needed for cough. 03/03/23  Yes Bedsole, Amy E, MD  Cholecalciferol  (VITAMIN D3) 50 MCG (2000 UT) capsule Take 2,000 Units by mouth daily.   Yes [provider]  fludrocortisone  (FLORINEF ) 0.1 MG tablet Take 1 tablet (0.1 mg total) by mouth 2 (two) times daily. 09/25/23 09/24/24 Yes Furth, Cadence H, PA-C  FLUoxetine  (PROZAC ) 20 MG capsule Take 1 capsule by mouth once daily 06/23/23  Yes Tower, Laine LABOR, MD  gabapentin  (NEURONTIN ) 100 MG capsule Take 200 mg by mouth 2 (two) times daily as needed. 03/02/23  Yes [provider]  levETIRAcetam  (KEPPRA ) 250 MG tablet Take 250 mg by mouth 2 (two) times daily.  11/02/19  Yes [provider]  midodrine  (PROAMATINE ) 10 MG tablet Take 1 tablet (10 mg total) by mouth 3 (three) times daily with meals. 09/11/23  Yes Awanda City, MD  OXYGEN  Inhale 4 L into the lungs continuous.   Yes [provider]  pantoprazole  (PROTONIX ) 40 MG tablet Take 1 tablet (40 mg total) by mouth daily. 11/20/22 11/20/23 Yes Tamea Dedra CROME, MD  potassium chloride  (KLOR-CON  M) 10 MEQ tablet Take 10 mEq by mouth daily. 09/15/23 10/15/23 Yes [provider]  pyridostigmine  (MESTINON ) 60 MG tablet Take 1 tablet (60 mg total) by mouth 3 (three) times daily. 09/25/23  Yes Furth, Cadence H, PA-C  Tiotropium Bromide-Olodaterol (STIOLTO RESPIMAT ) 2.5-2.5 MCG/ACT AERS INHALE 2 PUFFS BY MOUTH ONCE DAILY 05/25/23  Yes Tamea Dedra CROME, MD  zolpidem  (AMBIEN ) 10 MG tablet TAKE 1/2 TO 1 (ONE-HALF TO ONE) TABLET BY MOUTH AT BEDTIME AS NEEDED FOR SLEEP 06/23/23  Yes Tower, Laine LABOR, MD   DG Ankle Complete Right Result Date: 09/26/2023 CLINICAL DATA:  Pain.  Fall. EXAM: RIGHT ANKLE -  COMPLETE 3+ VIEW COMPARISON:  None Available. FINDINGS: Punctate linear foci of mineralization adjacent to the tip of the medial malleolus are suspicious for ligamentous injury. The remainder of the visualized bones appear  intact. No malalignment. Ankle mortise is congruent. Joint spaces are relatively maintained. Soft tissue swelling along the medial ankle. IMPRESSION: Punctate linear foci of mineralization adjacent to the tip of the medial malleolus are suspicious for ligamentous injury. Overlying soft tissue swelling. Electronically Signed   By: Harrietta Sherry M.D.   On: 09/26/2023 16:22    Positive ROS: All other systems have been reviewed and were otherwise negative with the exception of those mentioned in the HPI and as above.  Physical Exam: General:  Alert, no acute distress Psychiatric:  Patient is competent for consent with normal mood and affect   Cardiovascular:  No pedal edema Respiratory:  No wheezing, non-labored breathing GI:  Abdomen is soft and non-tender Skin:  No lesions in the area of chief complaint Neurologic:  Sensation intact distally Lymphatic:  No axillary or cervical lymphadenopathy  Orthopedic Exam:  Orthopedic examination is limited to the right foot and lower leg.  Skin inspection around the right ankle is notable for mild swelling, but otherwise is unremarkable.  No erythema, ecchymosis, abrasions, or other skin abnormalities are identified.  She has mild-moderate tenderness to palpation along the medial and lateral aspects of the distal tibia and fibula, but has only minimal tenderness to palpation over the deltoid ligament medially or the distal fibula laterally around the ankle itself.  She is able to dorsiflex and plantarflex her toes and ankle without significant pain.  She is grossly neurovascularly intact to her right foot.  X-rays:  AP, lateral, and mortise views of the right ankle are available for review and have been reviewed by myself.  The findings  are as described above.  Other than the tiny avulsion fragment off the tip of the medial malleolus best seen on the AP view, no fractures, lytic lesions, or significant degenerative changes are identified.  Assessment: Acute right ankle sprain.  Plan: The treatment options have been discussed with the patient and her husband who is at the bedside.  I have explained to this patient that this injury can be managed nonsurgically.  I feel that she may be mobilized with physical therapy, weightbearing as tolerated on her foot once a cam walker boot or Aircast splint has been applied to the ankle.  The injury may take 2 to 3 weeks to improve to the point where she no longer needs her assistive device.  She may need to use a walker or cane as needed for balance and support.  Thank you for asking me to participate in the care of this most pleasant yet unfortunate woman.  I will be happy to follow her with you.   DOROTHA Reyes Maltos, MD  Beeper #:  [phone]  09/27/2023 2:04 PM

## 2023-09-28 ENCOUNTER — Observation Stay

## 2023-09-28 DIAGNOSIS — R55 Syncope and collapse: Secondary | ICD-10-CM

## 2023-09-28 LAB — BASIC METABOLIC PANEL WITH GFR
Anion gap: 9 (ref 5–15)
BUN: 13 mg/dL (ref 8–23)
CO2: 27 mmol/L (ref 22–32)
Calcium: 8.6 mg/dL — ABNORMAL LOW (ref 8.9–10.3)
Chloride: 101 mmol/L (ref 98–111)
Creatinine, Ser: 0.72 mg/dL (ref 0.44–1.00)
GFR, Estimated: >60 mL/min (ref >60)
Glucose, Bld: 99 mg/dL (ref 70–99)
Potassium: 3.3 mmol/L — ABNORMAL LOW (ref 3.5–5.1)
Sodium: 137 mmol/L (ref 135–145)

## 2023-09-28 LAB — CBC
HCT: 31.6 % — ABNORMAL LOW (ref 36.0–46.0)
Hemoglobin: 9.9 g/dL — ABNORMAL LOW (ref 12.0–15.0)
MCH: 28.3 pg (ref 26.0–34.0)
MCHC: 31.3 g/dL (ref 30.0–36.0)
MCV: 90.3 fL (ref 80.0–100.0)
Platelets: 343 K/uL (ref 150–400)
RBC: 3.5 MIL/uL — ABNORMAL LOW (ref 3.87–5.11)
RDW: 13 % (ref 11.5–15.5)
WBC: 7.5 K/uL (ref 4.0–10.5)
nRBC: 0 % (ref 0.0–0.2)

## 2023-09-28 LAB — GLUCOSE, CAPILLARY
Glucose-Capillary: 102 mg/dL — ABNORMAL HIGH (ref 70–99)
Glucose-Capillary: 109 mg/dL — ABNORMAL HIGH (ref 70–99)

## 2023-09-28 MED ORDER — POTASSIUM CHLORIDE CRYS ER 20 MEQ PO TBCR
40.0000 meq | EXTENDED_RELEASE_TABLET | Freq: Once | ORAL | Status: AC
  Administered 2023-09-28: 40 meq via ORAL
  Filled 2023-09-28: qty 2

## 2023-09-28 NOTE — Progress Notes (Addendum)
 Occupational Therapy Treatment Patient Details Name: Diana Cook MRN: [id number] DOB: May 06, 1950 Today's Date: 09/28/2023   History of present illness Diana Cook is a 73 y.o. female with medical history significant for COPD on 4 L O2 via [place], CAD/MI, CHB s/p PPM, carotid stenosis s/p stents on Eliquis , seizures, lumbar radiculopathy, OA, breast cancer s/p surgery/RT, HFpEF, and severe orthostatic hypotension for which she was hospitalized a couple weeks prior with a subsequent ED visit at [facility] who is being admitted for syncopal event.  Patient has several brief syncopal events related to orthostatic hypotension usually resolved by lying down however on the night of arrival she had an prolonged syncopal episode that was slow to improve with lying flat.   OT comments  Pt seen for OT treatment on this date. Upon arrival to room pt supine in bed, agreeable to tx. Pt requires education on orthostatic hypotension exercises (including ankle pumps and gluteal squeezes) in preparation for ADL task engagement/functional transfers. BP in supine 142/57, sitting EOB 97/59, sitting with cuing for Orthostatic management +3 minutes 115/55, following step pivot transfer to chair 97/61, sitting for +5 minutes 114/59.  Pt with some improvement with Orthostatic management with implementation of strategies and was provided education on home safety with application of Orthostatic strategies including use of Ted hose and abdominal binder (not currently available however pt reports it is at home).  Ongoing barriers centered around uncontrolled orthostatic hypotension negatively impacting ability to safely engage in standing components of ADLs.  Pt making progress toward goals, will continue to follow POC. Discharge recommendation remains appropriate.  At end of session, pt seated in recliner with NT present.  Would likely benefit from Short term rehab due to weakness and decreased functional balance/fall risk.      If plan  is discharge home, recommend the following:  A little help with walking and/or transfers;A little help with bathing/dressing/bathroom;Assistance with cooking/housework;Assist for transportation;Help with stairs or ramp for entrance   Equipment Recommendations  None recommended by OT    Recommendations for Other Services      Precautions / Restrictions Precautions Precautions: Fall Recall of Precautions/Restrictions: Intact Precaution/Restrictions Comments: MONITOR ORTHOSTATICS Required Braces or Orthoses: Other Brace Other Brace: ted hose, cam boot for R ankle, ace wrap to L LE, abdmoninal binder? Restrictions Weight Bearing Restrictions Per Provider Order: No       Mobility Bed Mobility Overal bed mobility: Modified Independent             General bed mobility comments: supine to sit w/o hesitation, minimal cuing no assist    Transfers Overall transfer level: Needs assistance Equipment used: Rolling walker (2 wheels) Transfers: Sit to/from Stand, Bed to chair/wheelchair/BSC Sit to Stand: Min assist     Step pivot transfers: Contact guard assist     General transfer comment: Education on orthostatic symptom management during toileting tasks/transfers, education/training on hand/foot placement for safe transitions between surfaces     Balance Overall balance assessment: Needs assistance Sitting-balance support: Feet supported Sitting balance-Leahy Scale: Normal Sitting balance - Comments: steady sitting, reaching outside BOS   Standing balance support: Bilateral upper extremity supported, During functional activity, Reliant on assistive device for balance Standing balance-Leahy Scale: Fair Standing balance comment: heavily RW reliant during transfers with limited insight for safety awareness during transfers.                           ADL either performed or assessed with clinical judgement  ADL Overall ADL's : Needs assistance/impaired                          Toilet Transfer: Minimal assistance;Rolling walker (2 wheels) Toilet Transfer Details (indicate cue type and reason): simulated from EOB to chair, verbal cuing for orthostatic hypotension exercises and safety with hand placement           General ADL Comments: Education on orthostatic symptom management during toileting tasks/transfers, education/training on hand/foot placement for safe transitions between surfaces    Extremity/Trunk Assessment Upper Extremity Assessment Upper Extremity Assessment: Generalized weakness            Vision Baseline Vision/History: 0 No visual deficits     Perception     Praxis     Communication Communication Communication: No apparent difficulties   Cognition Arousal: Alert Behavior During Therapy: WFL for tasks assessed/performed Cognition: No apparent impairments                               Following commands: Intact        Cueing   Cueing Techniques: Verbal cues, Tactile cues  Exercises Total Joint Exercises Ankle Circles/Pumps: AROM, 10 reps, Left, Seated, Supine Gluteal Sets: AROM, Both, 20 reps, Seated Other Exercises Other Exercises: Education provided on orthostatic hypotension exercises (including ankle pumps and gluteal squeezes) in preparation for ADL task engagement at home and the placement for 3:1 commode. BP in supine 142/57, sitting EOB 97/59, sitting with cuing for Orthostatic management +3 minutes 115/55, following step pivot transfer to chair 97/61, sitting for +5 minutes 114/59.    Shoulder Instructions       General Comments BP in supine 142/57, sitting EOB 97/59, sitting with cuing for Orthostatic management +3 minutes 115/55, following step pivot transfer to chair 97/61, sitting for +5 minutes 114/59.    Pertinent Vitals/ Pain       Pain Assessment Pain Assessment: No/denies pain  Home Living                                          Prior  Functioning/Environment              Frequency  Min 2X/week        Progress Toward Goals  OT Goals(current goals can now be found in the care plan section)  Progress towards OT goals: Progressing toward goals     Plan      Co-evaluation                 AM-PAC OT 6 Clicks Daily Activity     Outcome Measure   Help from another person eating meals?: None Help from another person taking care of personal grooming?: A Little Help from another person toileting, which includes using toliet, bedpan, or urinal?: A Little Help from another person bathing (including washing, rinsing, drying)?: A Little Help from another person to put on and taking off regular upper body clothing?: A Little Help from another person to put on and taking off regular lower body clothing?: A Little 6 Click Score: 19    End of Session Equipment Utilized During Treatment: Gait belt;Rolling walker (2 wheels);Other (comment) (Cam boot)  OT Visit Diagnosis: Other abnormalities of gait and mobility (R26.89);Repeated falls (R29.6)   Activity Tolerance Patient tolerated treatment well  Patient Left in chair;with call bell/phone within reach;with nursing/sitter in room   Nurse Communication Mobility status        Time: 9196-9147 OT Time Calculation (min): 49 min  Charges: OT General Charges $OT Visit: 1 Visit OT Treatments $Therapeutic Activity: 38-52 mins  Harlene Sharps OTR/L   Harlene LITTIE Sharps 09/28/2023, 9:11 AM

## 2023-09-28 NOTE — Progress Notes (Signed)
 Progress Note   Patient: Diana Cook FMW:[id number] DOB: [birth year] DOA: 09/25/2023     0 DOS: the patient was seen and examined on 09/28/2023   Brief hospital course:  Diana Cook is a 73 y.o. female with medical history significant for COPD on 4 L O2 via [place], CAD/MI, CHB s/p PPM, carotid stenosis s/p stents on Eliquis , seizures, lumbar radiculopathy, OA, breast cancer s/p surgery/RT, HFpEF, and severe orthostatic hypotension for which she was hospitalized a couple weeks prior with a subsequent ED visit at [facility] who is being admitted for syncopal event.  Patient has several brief syncopal events related to orthostatic hypotension usually resolved by lying down however on the night of arrival she had an prolonged syncopal episode that was slow to improve with lying flat.  She eventually came around after 10 to 15 minutes.  She had no jerking or seizure-like activity.  She is currently midodrine  and fludrocortisone  and an abdominal binder.  She had a recent pacemaker interrogation with increase limit on PPM for HR,.  She is awaiting a [facility] cardiology follow-up appointment. Arrival in the ED vitals were within normal limits.  Orthostatics not rechecked.  Labs pertinent for troponin of 81 and potassium of 2.6.  Hemoglobin was at baseline at 10.1 EKG showed sinus at 86 Patient treated with Zofran   Admission requested    Assessment and Plan:  Syncope and collapse History of severe orthostatic hypotension, with recurrent syncope Suspect related to orthostatic hypotension.  Seizure not suspected Continue midodrine  10 mg 3 times daily Recently restarted on fludrocortisone  0.1 mg twice daily([facility]) Continue pyridostigmine -a new prescription which she was not started yet and is being started as inpatient Continue abdominal binder Advised to hydrate 3 L fluid daily([facility]) Cardiology was consulted and they have started the patient on pyridostigmine . Will check orthostatics when the patient is able to stand  on her right foot. Appreciate PT input and they recommend skilled nursing facility upon discharge for subacute rehab   Right foot pain: The patient continues to complain of right foot pain.  CT of the right lower extremity and foot demonstrates likely ligamentous injury.  Appreciate orthopedic surgery input. They have diagnosed her with an acute right ankle sprain and recommended a cam boot and non-surgical management. She will be mobilized with physical therapy and weight bearing as tolerated on her foot once a cam walker boot or aircast splint has been placed. She may need a walker or cane for support and balance while she heals. Will need skilled nursing facility on discharge   Elevated troponin CAD with history of MI Suspect secondary to syncopal event EKG nonacute and patient does not have chest pain Continue aspirin  and atorvastatin  Cardiology is on board   Hypokalemia Supplement potassium Check magnesium  level   Complete heart block s/p pacemaker (HCC) No acute issues suspected Interrogated on 7/22 by cardiologist at [facility] HR limit increased   Carotid stenosis s/p stents Chronic anticoagulation No acute issues suspected Continue Eliquis    Seizures (HCC) Last seizure 6 years ago Acute seizure not suspected at this time Continue Keppra    Chronic heart failure with preserved ejection fraction (HFpEF) (HCC) Clinically euvolemic   Chronic obstructive pulmonary disease (HCC) Chronic respiratory failure with hypoxia on 4 L home O2 Not acutely exacerbated Nebs as needed Continue supplemental oxygen    History of breast cancer S/p surgery/RT  no acute issues suspected      Subjective: No new complaints.  Sitting up in a recliner Physical Exam: Vitals:  09/28/23 0406 09/28/23 0520 09/28/23 0851 09/28/23 0859  BP: (!) 126/58  (!) 114/59 (!) 114/59  Pulse: 72  81 83  Resp: 18  18 18   Temp: 98.5 F (36.9 C)     TempSrc: Oral     SpO2: 91%  92% 91%   Weight:  92.2 kg    Height:       Constitutional:  The patient is awake, alert, and oriented x 3. No acute distress. Eyes:  pupils and irises appear normal Normal lids and conjunctivae ENMT:  grossly normal hearing  Lips appear normal external ears, nose appear normal Oropharynx: mucosa, tongue,posterior pharynx appear normal Neck:  neck appears normal, no masses, normal ROM, supple no thyromegaly Respiratory:  No increased work of breathing. No wheezes, rales, or rhonchi No tactile fremitus Cardiovascular:  Regular rate and rhythm No murmurs, ectopy, or gallups. No lateral PMI. No thrills. Abdomen:  Abdomen is soft, non-tender, non-distended No hernias, masses, or organomegaly Normoactive bowel sounds.  Musculoskeletal:  No cyanosis, clubbing, or edema Skin:  No rashes, lesions, ulcers palpation of skin: no induration or nodules Neurologic:  CN 2-12 intact Sensation all 4 extremities intact Psychiatric:  Mental status Mood, affect appropriate Orientation to person, place, time  judgment and insight appear intact      Data Reviewed: Potassium 3.3, hemoglobin 9.9 Labs reviewed  Family Communication: Plan of care discussed with patient and spouse at the bedside.  All questions and concerns have been addressed.  They verbalized understanding and agreed with the plan.  Disposition: Status is: Observation The patient remains OBS appropriate and will d/c before 2 midnights.  Planned Discharge Destination: Skilled nursing facility    Time spent: 35 minutes  Author: Aimee Somerset, MD 09/28/2023 12:19 PM  For on call review [email or web address].

## 2023-09-28 NOTE — Progress Notes (Signed)
 Eeg done

## 2023-09-28 NOTE — Progress Notes (Signed)
 Patient ID: Diana Cook, female   DOB: 10-Jan-1951, 73 y.o.   MRN: [id number]  Subjective: The patient feels that her ankle is feeling somewhat better this morning, although she has not yet tried to get out of bed or put weight on her foot.  She has no new complaints pertaining to her right lower leg or foot.   Objective: Vital signs in last 24 hours: Temp:  [98.2 F (36.8 C)-98.5 F (36.9 C)] 98.5 F (36.9 C) (08/04 0406) Pulse Rate:  [63-72] 72 (08/04 0406) Resp:  [17-19] 18 (08/04 0406) BP: (126-174)/(58-76) 126/58 (08/04 0406) SpO2:  [91 %-95 %] 91 % (08/04 0406) Weight:  [92.2 kg] 92.2 kg (08/04 0520)  Intake/Output from previous day: 08/03 0701 - 08/04 0700 In: 480 [P.O.:480] Out: 1210 [Urine:1210] Intake/Output this shift: No intake/output data recorded.  Recent Labs    09/25/23 2305 09/26/23 0520 09/28/23 0553  HGB 10.1* 9.4* 9.9*   Recent Labs    09/26/23 0520 09/28/23 0553  WBC 8.5 7.5  RBC 3.27* 3.50*  HCT 29.6* 31.6*  PLT 324 343   Recent Labs    09/26/23 0520 09/28/23 0553  NA 138 137  K 2.9* 3.3*  CL 102 101  CO2 27 27  BUN 13 13  CREATININE 0.64 0.72  GLUCOSE 119* 99  CALCIUM  8.6* 8.6*   No results for input(s): LABPT, INR in the last 72 hours.  Physical Exam: Orthopedic examination again is limited to the right lower leg and foot.  Her examination findings are unchanged as compared to yesterday.  She remains neurovascularly intact to the right lower extremity and foot.  Assessment: Right ankle sprain.  Plan: The treatment options have been reviewed with the patient.  She may be mobilized with physical therapy, weightbearing as tolerated on the right foot with her Aircast splint on and using a walker for balance and support.  If she is cleared by physical therapy, then she is cleared for discharge home from an orthopedic standpoint.    Thank you for asking me to participate in the care of this most pleasant woman.  I will sign off at  this time.  She may follow-up with us  in 3 to 4 weeks as needed.  If you have further need of orthopedic input during this hospitalization, please reconsult me.   Lonzy Mato J Tyrell Brereton 09/28/2023, 7:52 AM

## 2023-09-28 NOTE — Care Management Obs Status (Signed)
 MEDICARE OBSERVATION STATUS NOTIFICATION   Patient Details  Name: Diana Cook MRN: [id number] Date of Birth: June 02, 1950   Medicare Observation Status Notification Given:  Yes    Serenah Mill W, CMA 09/28/2023, 10:27 AM

## 2023-09-28 NOTE — Plan of Care (Signed)
  Problem: Education: Goal: Knowledge of General Education information will improve Description: Including pain rating scale, medication(s)/side effects and non-pharmacologic comfort measures Outcome: Progressing   Problem: Clinical Measurements: Goal: Diagnostic test results will improve Outcome: Progressing   Problem: Activity: Goal: Risk for activity intolerance will decrease Outcome: Progressing   Problem: Nutrition: Goal: Adequate nutrition will be maintained Outcome: Progressing   Problem: Pain Managment: Goal: General experience of comfort will improve and/or be controlled Outcome: Progressing

## 2023-09-28 NOTE — Progress Notes (Signed)
 Physical Therapy Treatment Patient Details Name: Diana Cook MRN: [id number] DOB: 1950-07-07 Today's Date: 09/28/2023   History of Present Illness Diana Cook is a 73 y.o. female with medical history significant for COPD on 4 L O2 via [place], CAD/MI, CHB s/p PPM, carotid stenosis s/p stents on Eliquis , seizures, lumbar radiculopathy, OA, breast cancer s/p surgery/RT, HFpEF, and severe orthostatic hypotension for which she was hospitalized a couple weeks prior with a subsequent ED visit at [facility] who is being admitted for syncopal event.  Patient has several brief syncopal events related to orthostatic hypotension usually resolved by lying down however on the night of arrival she had an prolonged syncopal episode that was slow to improve with lying flat.    PT Comments  Additional session performed this date. Pt remains orthostatic with +BP noted prior to session. Pt received in recliner, agreeable to session. Limited progress towards goals secondary to continued weakness/dizziness deficits. Pt able to accept WBing on R foot with brace intact. Still remains on 5L of O2 and very unsteady during static standing. Not safe to progress mobility at this time. Discussed dispo with pt and husband at bedside. Secure chat sent to care team regarding updated disposition. Will continue to progress.    If plan is discharge home, recommend the following: A little help with walking and/or transfers;A little help with bathing/dressing/bathroom;Assist for transportation;Help with stairs or ramp for entrance;Assistance with cooking/housework   Can travel by private vehicle     Yes  Equipment Recommendations  None recommended by PT    Recommendations for Other Services       Precautions / Restrictions Precautions Precautions: Fall Recall of Precautions/Restrictions: Intact Precaution/Restrictions Comments: MONITOR ORTHOSTATICS Required Braces or Orthoses: Other Brace Other Brace: ted hose, cam boot for R ankle,  ace wrap to L LE, abdmoninal binder Restrictions Weight Bearing Restrictions Per Provider Order: Yes RLE Weight Bearing Per Provider Order: Weight bearing as tolerated     Mobility  Bed Mobility               General bed mobility comments: NT, recevied in seated position    Transfers Overall transfer level: Needs assistance Equipment used: Rolling walker (2 wheels) Transfers: Sit to/from Stand, Bed to chair/wheelchair/BSC Sit to Stand: Min assist           General transfer comment: needs cues for sequencing. Heavy post lean and difficulty with trunkal elevation. Once standing, heavy B UE WBing noted through RW    Ambulation/Gait               General Gait Details: NT secondary to weakness, increased WOB, and dizziness   Stairs             Wheelchair Mobility     Tilt Bed    Modified Rankin (Stroke Patients Only)       Balance Overall balance assessment: Needs assistance Sitting-balance support: Feet supported Sitting balance-Leahy Scale: Good     Standing balance support: Bilateral upper extremity supported, During functional activity, Reliant on assistive device for balance Standing balance-Leahy Scale: Poor                              Communication Communication Communication: No apparent difficulties  Cognition Arousal: Alert Behavior During Therapy: WFL for tasks assessed/performed   PT - Cognitive impairments: No apparent impairments  PT - Cognition Comments: pleasant and agreeable to session Following commands: Intact      Cueing Cueing Techniques: Verbal cues, Tactile cues  Exercises Other Exercises Other Exercises: multiple sit<>Stands with RW adjusted to pt height. Pt with increased WOB during exertion. Attempted to reduce to baseline 4L, however O2 sats decreased to 86%. Increased to 5L with sats improving to 91% with exertion. Only able to tolerate standing <57min due to fatigue.  Decreased eccentric control during decent.    General Comments General comments (skin integrity, edema, etc.): BP in supine 142/57, sitting EOB 97/59, sitting with cuing for Orthostatic management +3 minutes 115/55, following step pivot transfer to chair 97/61, sitting for +5 minutes 114/59.      Pertinent Vitals/Pain Pain Assessment Pain Assessment: 0-10 Pain Score: 7  Pain Location: R ankle since fall Pain Descriptors / Indicators: Discomfort, Sore, Grimacing Pain Intervention(s): Limited activity within patient's tolerance, Repositioned, RN gave pain meds during session    Home Living                          Prior Function            PT Goals (current goals can now be found in the care plan section) Acute Rehab PT Goals Patient Stated Goal: improve PT Goal Formulation: With patient Time For Goal Achievement: 10/11/23 Potential to Achieve Goals: Fair Progress towards PT goals: Progressing toward goals    Frequency    Min 2X/week      PT Plan      Co-evaluation              AM-PAC PT 6 Clicks Mobility   Outcome Measure  Help needed turning from your back to your side while in a flat bed without using bedrails?: None Help needed moving from lying on your back to sitting on the side of a flat bed without using bedrails?: None Help needed moving to and from a bed to a chair (including a wheelchair)?: A Little Help needed standing up from a chair using your arms (e.g., wheelchair or bedside chair)?: A Little Help needed to walk in hospital room?: A Lot Help needed climbing 3-5 steps with a railing? : Total 6 Click Score: 17    End of Session Equipment Utilized During Treatment: Oxygen  Activity Tolerance: Patient tolerated treatment well;Treatment limited secondary to medical complications (Comment);Patient limited by pain Patient left: in chair;with call bell/phone within reach;with nursing/sitter in room Nurse Communication: Mobility  status;Other (comment) PT Visit Diagnosis: Other abnormalities of gait and mobility (R26.89);Repeated falls (R29.6);Difficulty in walking, not elsewhere classified (R26.2);Dizziness and giddiness (R42);Pain Pain - Right/Left: Right Pain - part of body: Ankle and joints of foot     Time: 9057-8996 PT Time Calculation (min) (ACUTE ONLY): 21 min  Charges:    $Therapeutic Activity: 8-22 mins PT General Charges $$ ACUTE PT VISIT: 1 Visit                     Corean Dade, PT, DPT, GCS [phone]    Aime Meloche 09/28/2023, 10:41 AM

## 2023-09-28 NOTE — Procedures (Signed)
 Patient Name: Diana Cook  MRN: [id number]  Epilepsy Attending: Pastor Falling  Referring Physician/Provider: No ref. provider found      Date: 09/28/2023 Duration: 30 minutes   Patient history: Patient with severe orthostatic hypotension, presenting with syncope. EEG to evaluate for seizure   Level of alertness: Awake, drowsy  AEDs during EEG study: LEV  Technical aspects: This EEG study was done with scalp electrodes positioned according to the 10-20 International system of electrode placement. Electrical activity was reviewed with band pass filter of 1-70Hz , sensitivity of 7 uV/mm, display speed of 26mm/sec with a 60Hz  notched filter applied as appropriate. EEG data were recorded continuously and digitally stored.  Video monitoring was available and reviewed as appropriate.  Description: The posterior dominant rhythm consists of 9-10 Hz activity of moderate voltage (25-35 uV) seen predominantly in posterior head regions, symmetric and reactive to eye opening and eye closing. Drowsiness was characterized by attenuation of the posterior background rhythm. Sleep was not seen. Hyperventilation and photic stimulation were not performed.     ABNORMALITY -None  IMPRESSION: This study is within normal limits. No seizures or epileptiform discharges were seen throughout the recording.  A normal interictal EEG does not exclude nor support the diagnosis of epilepsy.   Pastor Falling MD Neurology

## 2023-09-29 DIAGNOSIS — E669 Obesity, unspecified: Secondary | ICD-10-CM | POA: Diagnosis present

## 2023-09-29 DIAGNOSIS — Z6830 Body mass index (BMI) 30.0-30.9, adult: Secondary | ICD-10-CM | POA: Diagnosis not present

## 2023-09-29 DIAGNOSIS — I2489 Other forms of acute ischemic heart disease: Secondary | ICD-10-CM | POA: Diagnosis not present

## 2023-09-29 DIAGNOSIS — Z823 Family history of stroke: Secondary | ICD-10-CM | POA: Diagnosis not present

## 2023-09-29 DIAGNOSIS — I442 Atrioventricular block, complete: Secondary | ICD-10-CM | POA: Diagnosis present

## 2023-09-29 DIAGNOSIS — R55 Syncope and collapse: Secondary | ICD-10-CM | POA: Diagnosis present

## 2023-09-29 DIAGNOSIS — J9611 Chronic respiratory failure with hypoxia: Secondary | ICD-10-CM | POA: Diagnosis present

## 2023-09-29 DIAGNOSIS — M8580 Other specified disorders of bone density and structure, unspecified site: Secondary | ICD-10-CM | POA: Diagnosis present

## 2023-09-29 DIAGNOSIS — E876 Hypokalemia: Secondary | ICD-10-CM | POA: Diagnosis present

## 2023-09-29 DIAGNOSIS — G40909 Epilepsy, unspecified, not intractable, without status epilepticus: Secondary | ICD-10-CM | POA: Diagnosis present

## 2023-09-29 DIAGNOSIS — I480 Paroxysmal atrial fibrillation: Secondary | ICD-10-CM | POA: Diagnosis present

## 2023-09-29 DIAGNOSIS — I5032 Chronic diastolic (congestive) heart failure: Secondary | ICD-10-CM | POA: Diagnosis present

## 2023-09-29 DIAGNOSIS — Z95828 Presence of other vascular implants and grafts: Secondary | ICD-10-CM | POA: Diagnosis not present

## 2023-09-29 DIAGNOSIS — Z7952 Long term (current) use of systemic steroids: Secondary | ICD-10-CM | POA: Diagnosis not present

## 2023-09-29 DIAGNOSIS — Z820 Family history of epilepsy and other diseases of the nervous system: Secondary | ICD-10-CM | POA: Diagnosis not present

## 2023-09-29 DIAGNOSIS — Z860100 Personal history of colon polyps, unspecified: Secondary | ICD-10-CM | POA: Diagnosis not present

## 2023-09-29 DIAGNOSIS — I2510 Atherosclerotic heart disease of native coronary artery without angina pectoris: Secondary | ICD-10-CM | POA: Diagnosis present

## 2023-09-29 DIAGNOSIS — J449 Chronic obstructive pulmonary disease, unspecified: Secondary | ICD-10-CM | POA: Diagnosis present

## 2023-09-29 DIAGNOSIS — Z7901 Long term (current) use of anticoagulants: Secondary | ICD-10-CM | POA: Diagnosis not present

## 2023-09-29 DIAGNOSIS — Z885 Allergy status to narcotic agent status: Secondary | ICD-10-CM | POA: Diagnosis not present

## 2023-09-29 DIAGNOSIS — Z803 Family history of malignant neoplasm of breast: Secondary | ICD-10-CM | POA: Diagnosis not present

## 2023-09-29 DIAGNOSIS — S93401A Sprain of unspecified ligament of right ankle, initial encounter: Secondary | ICD-10-CM | POA: Diagnosis present

## 2023-09-29 DIAGNOSIS — I951 Orthostatic hypotension: Secondary | ICD-10-CM | POA: Diagnosis present

## 2023-09-29 DIAGNOSIS — M1990 Unspecified osteoarthritis, unspecified site: Secondary | ICD-10-CM | POA: Diagnosis present

## 2023-09-29 DIAGNOSIS — Z8049 Family history of malignant neoplasm of other genital organs: Secondary | ICD-10-CM | POA: Diagnosis not present

## 2023-09-29 NOTE — Plan of Care (Signed)
  Problem: Clinical Measurements: Goal: Will remain free from infection Outcome: Progressing Goal: Diagnostic test results will improve Outcome: Progressing   Problem: Activity: Goal: Risk for activity intolerance will decrease Outcome: Progressing   Problem: Nutrition: Goal: Adequate nutrition will be maintained Outcome: Progressing

## 2023-09-29 NOTE — Progress Notes (Signed)
 Occupational Therapy Treatment Patient Details Name: Diana Cook MRN: [id number] DOB: 22-Feb-1951 Today's Date: 09/29/2023   History of present illness Diana Cook is a 73 y.o. female with medical history significant for COPD on 4 L O2 via [place], CAD/MI, CHB s/p PPM, carotid stenosis s/p stents on Eliquis , seizures, lumbar radiculopathy, OA, breast cancer s/p surgery/RT, HFpEF, and severe orthostatic hypotension for which she was hospitalized a couple weeks prior with a subsequent ED visit at [facility] who is being admitted for syncopal event.  Patient has several brief syncopal events related to orthostatic hypotension usually resolved by lying down however on the night of arrival she had an prolonged syncopal episode that was slow to improve with lying flat.   OT comments  Pt seen for OT tx. Pt reports just getting back to bed after being up in the recliner for extended period of time. Pt instructed in home/routines modifications, falls prevention, and AE/DME for LB ADL to prevent falls and minimize risk of orthostatic hypotension. Pt verbalized understanding. Will trial AE for LB dressing next session while monitoring orthostatics.       If plan is discharge home, recommend the following:  A little help with walking and/or transfers;A little help with bathing/dressing/bathroom;Assistance with cooking/housework;Assist for transportation;Help with stairs or ramp for entrance   Equipment Recommendations  Other (comment) (consider LH sponge, reacher, sock aide)    Recommendations for Other Services      Precautions / Restrictions Precautions Precautions: Fall Recall of Precautions/Restrictions: Intact Precaution/Restrictions Comments: MONITOR ORTHOSTATICS Required Braces or Orthoses: Other Brace Other Brace: ted hose, cam boot for R ankle, ace wrap to L LE, abdmoninal binder Restrictions Weight Bearing Restrictions Per Provider Order: Yes RLE Weight Bearing Per Provider Order: Weight bearing as  tolerated       Mobility Bed Mobility               General bed mobility comments: pt declined, just back to bed prior to OT's arrival after sitting up in the recliner for several hours    Transfers                         Balance                                           ADL either performed or assessed with clinical judgement   ADL                                              Extremity/Trunk Assessment              Vision       Perception     Praxis     Communication Communication Communication: No apparent difficulties   Cognition Arousal: Alert Behavior During Therapy: WFL for tasks assessed/performed Cognition: No apparent impairments                                        Cueing      Exercises Other Exercises Other Exercises: Pt instructed in home/routines modifications, falls prevention, and AE/DME for LB ADL to prevent falls and minimize risk of orthostatic  hypotention    Shoulder Instructions       General Comments      Pertinent Vitals/ Pain       Pain Assessment Pain Assessment: No/denies pain  Home Living                                          Prior Functioning/Environment              Frequency  Min 2X/week        Progress Toward Goals  OT Goals(current goals can now be found in the care plan section)  Progress towards OT goals: Progressing toward goals  Acute Rehab OT Goals Patient Stated Goal: get better OT Goal Formulation: With patient Time For Goal Achievement: 09/12/23 Potential to Achieve Goals: Fair  Plan      Co-evaluation                 AM-PAC OT 6 Clicks Daily Activity     Outcome Measure   Help from another person eating meals?: None Help from another person taking care of personal grooming?: A Little Help from another person toileting, which includes using toliet, bedpan, or urinal?: A Little Help  from another person bathing (including washing, rinsing, drying)?: A Little Help from another person to put on and taking off regular upper body clothing?: A Little Help from another person to put on and taking off regular lower body clothing?: A Little 6 Click Score: 19    End of Session Equipment Utilized During Treatment: Oxygen   OT Visit Diagnosis: Other abnormalities of gait and mobility (R26.89);Repeated falls (R29.6)   Activity Tolerance Patient tolerated treatment well   Patient Left in bed;with call bell/phone within reach;with bed alarm set   Nurse Communication          Time: 8451-8389 OT Time Calculation (min): 22 min  Charges: OT General Charges $OT Visit: 1 Visit OT Treatments $Self Care/Home Management : 8-22 mins  Warren SAUNDERS., MPH, MS, OTR/L ascom [phone] 09/29/23, 4:41 PM

## 2023-09-29 NOTE — Progress Notes (Signed)
 Physical Therapy Treatment Patient Details Name: Diana Cook MRN: [id number] DOB: [birth year] Today's Date: 09/29/2023   History of Present Illness Diana Cook is a 73 y.o. female with medical history significant for COPD on 4 L O2 via [place], CAD/MI, CHB s/p PPM, carotid stenosis s/p stents on Eliquis , seizures, lumbar radiculopathy, OA, breast cancer s/p surgery/RT, HFpEF, and severe orthostatic hypotension for which she was hospitalized a couple weeks prior with a subsequent ED visit at [facility] who is being admitted for syncopal event.  Patient has several brief syncopal events related to orthostatic hypotension usually resolved by lying down however on the night of arrival she had an prolonged syncopal episode that was slow to improve with lying flat.    PT Comments  Upon arrival, nursing obtaining new set of orthostatic BPs, assisted with donning abdominal binder, Airsplint, and ace wraps on B LE. Pt with severe orthostatics and not safe to ambulate at this time. Agreed to transfer bed->chair using RW demonstrating improved balance compared to previous date. Educated on HEP and progressed as able. Will continue to progress as able.    If plan is discharge home, recommend the following: A little help with walking and/or transfers;A little help with bathing/dressing/bathroom;Assist for transportation;Help with stairs or ramp for entrance;Assistance with cooking/housework   Can travel by private vehicle     Yes  Equipment Recommendations  None recommended by PT    Recommendations for Other Services       Precautions / Restrictions Precautions Precautions: Fall Recall of Precautions/Restrictions: Intact Precaution/Restrictions Comments: MONITOR ORTHOSTATICS Required Braces or Orthoses: Other Brace Other Brace: ted hose, cam boot for R ankle, ace wrap to L LE, abdmoninal binder Restrictions Weight Bearing Restrictions Per Provider Order: Yes RLE Weight Bearing Per Provider Order: Weight  bearing as tolerated     Mobility  Bed Mobility               General bed mobility comments: NT, recevied in seated position    Transfers Overall transfer level: Needs assistance Equipment used: Rolling walker (2 wheels) Transfers: Sit to/from Stand, Bed to chair/wheelchair/BSC Sit to Stand: Contact guard assist   Step pivot transfers: Contact guard assist       General transfer comment: improved technique this date with upright posture and improved balance. Able to take steps over to recliner using RW    Ambulation/Gait               General Gait Details: NT secondary to severe orthostatics   Stairs             Wheelchair Mobility     Tilt Bed    Modified Rankin (Stroke Patients Only)       Balance Overall balance assessment: Needs assistance Sitting-balance support: Feet supported Sitting balance-Leahy Scale: Good     Standing balance support: Bilateral upper extremity supported, During functional activity, Reliant on assistive device for balance Standing balance-Leahy Scale: Fair                              Hotel manager: No apparent difficulties  Cognition Arousal: Alert Behavior During Therapy: WFL for tasks assessed/performed   PT - Cognitive impairments: No apparent impairments                       PT - Cognition Comments: pleasant and agreeable to session Following commands: Intact      Cueing  Cueing Techniques: Verbal cues, Tactile cues  Exercises Other Exercises Other Exercises: Seated ther-ex performed in recliner including LAQ, AP, alt marching, hip abd/add, glut squeezes, modified abdominal crunches. Education given on frequency and duration with pt performance to ensure proper technique.    General Comments        Pertinent Vitals/Pain Pain Assessment Pain Assessment: No/denies pain    Home Living                          Prior Function             PT Goals (current goals can now be found in the care plan section) Acute Rehab PT Goals Patient Stated Goal: improve PT Goal Formulation: With patient Time For Goal Achievement: 10/11/23 Potential to Achieve Goals: Fair Progress towards PT goals: Progressing toward goals    Frequency    Min 2X/week      PT Plan      Co-evaluation              AM-PAC PT 6 Clicks Mobility   Outcome Measure  Help needed turning from your back to your side while in a flat bed without using bedrails?: None Help needed moving from lying on your back to sitting on the side of a flat bed without using bedrails?: None Help needed moving to and from a bed to a chair (including a wheelchair)?: A Little Help needed standing up from a chair using your arms (e.g., wheelchair or bedside chair)?: A Little Help needed to walk in hospital room?: A Lot Help needed climbing 3-5 steps with a railing? : Total 6 Click Score: 17    End of Session Equipment Utilized During Treatment: Oxygen  Activity Tolerance: Patient tolerated treatment well Patient left: in chair;with nursing/sitter in room Nurse Communication: Mobility status;Other (comment) PT Visit Diagnosis: Other abnormalities of gait and mobility (R26.89);Repeated falls (R29.6);Difficulty in walking, not elsewhere classified (R26.2);Dizziness and giddiness (R42);Pain Pain - Right/Left: Right Pain - part of body: Ankle and joints of foot     Time: 8965-8940 PT Time Calculation (min) (ACUTE ONLY): 25 min  Charges:    $Therapeutic Exercise: 8-22 mins $Therapeutic Activity: 8-22 mins PT General Charges $$ ACUTE PT VISIT: 1 Visit                     Corean Dade, PT, DPT, GCS [phone]    Jibril Mcminn 09/29/2023, 1:15 PM

## 2023-09-29 NOTE — NC FL2 (Signed)
 [place]  MEDICAID FL2 LEVEL OF CARE FORM     IDENTIFICATION  Patient Name: Diana Cook Birthdate: [birth year] Sex: female Admission Date (Current Location): 09/25/2023  [place] and [place] Number:  Chiropodist and Address:  [facility], [address] [place], [place] [address]      Provider Number: [id number]  Attending Physician Name and Address:  Lenon Marien CROME, MD  Relative Name and Phone Number:  Teran Daughenbaugh [phone]    Current Level of Care: Hospital Recommended Level of Care: Skilled Nursing Facility Prior Approval Number:    Date Approved/Denied:   PASRR Number: [id number]  Discharge Plan: SNF    Current Diagnoses: Patient Active Problem List   Diagnosis Date Noted   Syncope and collapse 09/26/2023   Hypokalemia 09/26/2023   Carotid stenosis s/p stents 09/26/2023   Chronic anticoagulation 09/26/2023   Elevated troponin 09/26/2023   Ankle pain, right 09/26/2023   Physical deconditioning 09/18/2023   Syncope 08/29/2023   Orthostatic hypotension 08/28/2023   Decreased GFR 04/29/2023   Presence of heart assist device (HCC) 03/25/2023   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/25/2023   Persistent cough 03/03/2023   Urinary urgency 02/09/2023   Bilateral carotid artery stenosis 04/29/2022   Carotid stenosis, symptomatic w/o infarct 03/23/2022   Right shoulder pain 11/25/2021   Mild anemia 11/03/2021   Thyroid  nodule 09/17/2021   Dyslipidemia 09/05/2021   Depression 09/05/2021   GERD without esophagitis 09/05/2021   Vitamin D  deficiency 08/20/2021   Colon cancer screening 07/30/2021   Hypercalcemia 07/29/2021   UTI (urinary tract infection) 07/17/2021   Dysuria 07/08/2021   Arterial hypotension 07/08/2021   Paroxysmal atrial fibrillation (HCC) 04/11/2021   Current use of proton pump inhibitor 10/24/2020   Sleep apnea 10/24/2020   Fatigue 10/24/2020   Lightheadedness 05/16/2020   Radicular pain in  left arm 03/02/2020   Neck pain on left side 02/15/2020   Paresthesia 02/15/2020   Chronic respiratory failure with hypoxia (HCC) 11/17/2019   Chronic heart failure with preserved ejection fraction (HFpEF) (HCC) 08/11/2019   Coronary artery disease involving native coronary artery of native heart without angina pectoris 06/20/2019   Gastroesophageal reflux disease    Stomach irritation    Abnormal CT scan, esophagus    Polyp of ascending colon    Personal history of tobacco use, presenting hazards to health 01/18/2018   Grade I diastolic dysfunction 12/21/2017   Globus pharyngeus 12/21/2017   Chronic obstructive pulmonary disease (HCC) 12/15/2017   Hyperlipidemia 12/07/2017   Seizures (HCC) 10/01/2016   Elevated glucose 08/31/2016   Grief reaction 04/11/2016   History of breast cancer 12/11/2015   Osteoporosis 11/22/2015   Estrogen deficiency 09/07/2015   Wrinkles 09/07/2015   Post herpetic neuralgia 09/01/2014   Encounter for Medicare annual wellness exam 07/11/2014   Routine general medical examination at a health care facility 06/26/2013   H/O small bowel obstruction 04/15/2013   SOB (shortness of breath) 02/10/2013   Complete heart block s/p pacemaker (HCC) 11/03/2011   Pacemaker-[employer] 11/03/2011   Lumbar spinal stenosis 09/02/2011   POSTMENOPAUSAL STATUS 08/06/2009   HIDRADENITIS SUPPURATIVA 06/26/2008   HERNIATED DISC 12/14/2007   BACK, LOWER, PAIN 12/02/2007   Hyperkalemia 08/06/2007   History of colonic polyps 09/14/2006   Former smoker 06/10/2006   Asthma 06/10/2006   H/O idiopathic seizure 06/10/2006    Orientation RESPIRATION BLADDER Height & Weight     Self, Time, Situation, Place  Normal Continent Weight: 204 lb 2.3  oz (92.6 kg) Height:  5' 8 (172.7 cm)  BEHAVIORAL SYMPTOMS/MOOD NEUROLOGICAL BOWEL NUTRITION STATUS      Continent Diet (Heart)  AMBULATORY STATUS COMMUNICATION OF NEEDS Skin   Limited Assist Verbally Normal                        Personal Care Assistance Level of Assistance  Feeding, Bathing, Dressing Bathing Assistance: Limited assistance Feeding assistance: Independent Dressing Assistance: Limited assistance     Functional Limitations Info             SPECIAL CARE FACTORS FREQUENCY  PT (By licensed PT), OT (By licensed OT)     PT Frequency: 5x/week OT Frequency: 5x/week            Contractures      Additional Factors Info  Code Status, Allergies Code Status Info: Full Allergies Info: Codeine, Morphine  And Codeine           Current Medications (09/29/2023):  This is the current hospital active medication list Current Facility-Administered Medications  Medication Dose Route Frequency Provider Last Rate Last Admin   acetaminophen  (TYLENOL ) tablet 650 mg  650 mg Oral Q6H PRN Duncan, Hazel V, MD   650 mg at 09/26/23 1636   Or   acetaminophen  (TYLENOL ) suppository 650 mg  650 mg Rectal Q6H PRN Duncan, Hazel V, MD       albuterol  (PROVENTIL ) (2.5 MG/3ML) 0.083% nebulizer solution 2.5 mg  2.5 mg Inhalation Q4H PRN Duncan, Hazel V, MD       apixaban  (ELIQUIS ) tablet 5 mg  5 mg Oral BID Duncan, Hazel V, MD   5 mg at 09/28/23 2116   atorvastatin  (LIPITOR) tablet 40 mg  40 mg Oral Daily Duncan, Hazel V, MD   40 mg at 09/28/23 9049   diclofenac  Sodium (VOLTAREN ) 1 % topical gel 4 g  4 g Topical QID Amin, Sumayya, MD   4 g at 09/28/23 2121   fludrocortisone  (FLORINEF ) tablet 0.1 mg  0.1 mg Oral BID Duncan, Hazel V, MD   0.1 mg at 09/28/23 2117   FLUoxetine  (PROZAC ) capsule 20 mg  20 mg Oral Daily Duncan, Hazel V, MD   20 mg at 09/28/23 0950   levETIRAcetam  (KEPPRA ) tablet 250 mg  250 mg Oral BID Duncan, Hazel V, MD   250 mg at 09/28/23 2117   midodrine  (PROAMATINE ) tablet 5 mg  5 mg Oral TID WC Swayze, Ava, DO   5 mg at 09/29/23 0815   ondansetron  (ZOFRAN ) tablet 4 mg  4 mg Oral Q6H PRN Duncan, Hazel V, MD       Or   ondansetron  (ZOFRAN ) injection 4 mg  4 mg Intravenous Q6H PRN Duncan, Hazel V, MD    4 mg at 09/26/23 1637   pantoprazole  (PROTONIX ) EC tablet 40 mg  40 mg Oral Daily Duncan, Hazel V, MD   40 mg at 09/28/23 0951   pyridostigmine  (MESTINON ) tablet 60 mg  60 mg Oral TID Swayze, Ava, DO   60 mg at 09/28/23 2117   sodium chloride  flush (NS) 0.9 % injection 3 mL  3 mL Intravenous Q12H Cleatus Hoof V, MD   3 mL at 09/28/23 2118   traMADol  (ULTRAM ) tablet 50 mg  50 mg Oral Q6H PRN Amin, Sumayya, MD   50 mg at 09/27/23 0501   zolpidem  (AMBIEN ) tablet 5 mg  5 mg Oral QHS PRN Duncan, Hazel V, MD   5 mg at 09/28/23 2116  Discharge Medications: Please see discharge summary for a list of discharge medications.  Relevant Imaging Results:  Relevant Lab Results:   Additional Information SSN:[id number]  Vashaun Osmon  [place], LCSW

## 2023-09-29 NOTE — Plan of Care (Signed)
  Problem: Education: Goal: Knowledge of General Education information will improve Description: Including pain rating scale, medication(s)/side effects and non-pharmacologic comfort measures Outcome: Progressing   Problem: Health Behavior/Discharge Planning: Goal: Ability to manage health-related needs will improve Outcome: Progressing   Problem: Clinical Measurements: Goal: Will remain free from infection Outcome: Progressing Goal: Diagnostic test results will improve Outcome: Progressing Goal: Respiratory complications will improve Outcome: Progressing   Problem: Nutrition: Goal: Adequate nutrition will be maintained Outcome: Progressing   Problem: Elimination: Goal: Will not experience complications related to bowel motility Outcome: Progressing Goal: Will not experience complications related to urinary retention Outcome: Progressing   Problem: Skin Integrity: Goal: Risk for impaired skin integrity will decrease Outcome: Progressing

## 2023-09-29 NOTE — Progress Notes (Deleted)
     Diana Brigante T. Jessup Ogas, MD, CAQ Sports Medicine [facility] [address] [place], [address]  Phone: [phone]  FAX: [phone]  Diana Cook - 73 y.o. female  MRN [id number]  Date of Birth: 10-11-1950  Date: 09/30/2023  PCP: Randeen Laine LABOR, MD  Referral: Randeen Laine LABOR, MD  No chief complaint on file.  Subjective:   Diana Cook is a 73 y.o. very pleasant female patient with There is no height or weight on file to calculate BMI. who presents with the following:  Patient presents with ongoing knee pain.  I previously saw her in March 2025 with bilateral knee osteoarthritis and pain.  She is chronically on 4 L of oxygen .  She has had numerous problems and falls since the last time I saw her.  Review of Systems is noted in the HPI, as appropriate  Objective:   There were no vitals taken for this visit.  GEN: No acute distress; alert,appropriate. PULM: Breathing comfortably in no respiratory distress PSYCH: Normally interactive.   Laboratory and Imaging Data:  Assessment and Plan:   ***

## 2023-09-29 NOTE — Progress Notes (Signed)
 Progress Note Patient: Diana Cook FMW:[id number] DOB: Oct 11, 1950 DOA: 09/25/2023     0 DOS: the patient was seen and examined on 09/29/2023   Brief hospital course: ANALYN MATUSEK is a 73 y.o. female with medical history significant for COPD on 4 L O2 via [place], CAD/MI, CHB s/p PPM, carotid stenosis s/p stents on Eliquis , seizures, lumbar radiculopathy, OA, breast cancer s/p surgery/RT, HFpEF, and severe orthostatic hypotension for which she was hospitalized a couple weeks prior with a subsequent ED visit at [facility] who is being admitted for syncopal event. She had a recent pacemaker interrogation with increase limit on PPM for HR. Arrival in the ED vitals were within normal limits.  Orthostatics not rechecked.  Labs pertinent for troponin of 81 and potassium of 2.6.  Hemoglobin was at baseline at 10.1 EKG showed sinus at 86 Patient treated with Zofran   Cardiology was consulted. No further syncope but orthostatics still significantly positive today and had oxygen  desaturation to 70s with standing on her home O2 level of 4L. She was asymptomatic but systolics dropped to 60s with standing. Recovered to 106 with rest again in supine.   Assessment and Plan:  Syncope and collapse  H/o severe orthostatic hypotension, with recurrent syncope - cardiology consulted and final recs are as follows: - Continue Mestinon  60 mg 3 times daily.  Ambulation, PT advised. - Continue midodrine  10 mg 3 times daily, Florinef  0.1 mg twice daily She continues to have severe orthostatic vitals with systolics dropping to 60s with standing- she was asymptomatic, however, and recovered when placed back in supine.  Continue abdominal binder   Right foot pain: CT of the right lower extremity and foot demonstrates likely ligamentous injury.  Appreciate orthopedic surgery input. They have diagnosed her with an acute right ankle sprain and recommended a cam boot and non-surgical management. She will be mobilized with physical therapy  and weight bearing as tolerated. - SNF at dc, TOC consulted   Elevated troponin CAD with history of MI Suspect secondary to syncopal event EKG nonacute and patient does not have chest pain Continue aspirin  and atorvastatin  Cardiology has signed off   Hypokalemia- recheck BMP am. Mg++ was replaced Supplement potassium PRN   Complete heart block s/p pacemaker (HCC) No acute issues suspected Interrogated on 7/22 by cardiologist at [facility] HR limit increased   Carotid stenosis s/p stents Chronic anticoagulation No acute issues suspected Continue Eliquis    Seizures (HCC) Last seizure 6 years ago Acute seizure not suspected at this time Continue Keppra    Chronic heart failure with preserved ejection fraction (HFpEF) (HCC) Clinically euvolemic   Chronic obstructive pulmonary disease (HCC) Chronic respiratory failure with hypoxia on 4 L home O2 Not acutely exacerbated Nebs as needed Continue supplemental oxygen    History of breast cancer S/p surgery/RT  no acute issues suspected  Subjective: pt reports feeling well at rest. No SOB this morning on her home O2 monitor. Has not been out of bed yet today. She is anxious to get her health conditions under control so she can go home.  Physical Exam: Vitals:   09/28/23 1556 09/28/23 2010 09/29/23 0229 09/29/23 0428  BP: (!) 173/87 (!) 117/54 (!) 133/59   Pulse: 73 73 65   Resp: 18 18 17    Temp: 98.1 F (36.7 C) 98.6 F (37 C) 98.5 F (36.9 C)   TempSrc: Oral Oral    SpO2: 94% 97% 93%   Weight:    92.6 kg  Height:  Constitutional:  The patient is awake, alert, and oriented x 3. No acute distress. Respiratory:  No increased work of breathing. No wheezes, rales, or rhonchi Cardiovascular:  Regular rate and rhythm No murmurs, ectopy, or gallups. Abdomen:  Abdomen is soft, non-tender, non-distended No hernias, masses, or organomegaly Normoactive bowel sounds.  Musculoskeletal:  No cyanosis, clubbing, or  edema Skin:  No rashes, lesions, ulcers palpation of skin: no induration or nodules Neurologic:  CN 2-12 intact Sensation all 4 extremities intact Psychiatric:  Mental status Mood, affect appropriate Orientation to person, place, time  judgment and insight appear intact   Data Reviewed:    Latest Ref Rng & Units 09/28/2023    5:53 AM 09/26/2023    5:20 AM 09/25/2023   11:05 PM  BMP  Glucose 70 - 99 mg/dL 99  880  869   BUN 8 - 23 mg/dL 13  13  16    Creatinine 0.44 - 1.00 mg/dL 9.27  9.35  9.08   Sodium 135 - 145 mmol/L 137  138  136   Potassium 3.5 - 5.1 mmol/L 3.3  2.9  2.6   Chloride 98 - 111 mmol/L 101  102  97   CO2 22 - 32 mmol/L 27  27  27    Calcium  8.9 - 10.3 mg/dL 8.6  8.6  9.3       Latest Ref Rng & Units 09/28/2023    5:53 AM 09/26/2023    5:20 AM 09/25/2023   11:05 PM  CBC  WBC 4.0 - 10.5 K/uL 7.5  8.5  10.2   Hemoglobin 12.0 - 15.0 g/dL 9.9  9.4  89.8   Hematocrit 36.0 - 46.0 % 31.6  29.6  31.2   Platelets 150 - 400 K/uL 343  324  382     Family Communication: Plan of care discussed with patient.  All questions and concerns have been addressed.  They verbalized understanding and agreed with the plan.  Disposition: Status is: Observation The patient remains OBS appropriate and will d/c before 2 midnights.  Planned Discharge Destination: Skilled nursing facility    Time spent: 45 minutes  Author: Marien LITTIE Piety, MD 09/29/2023 8:01 AM  For on call review [email or web address].

## 2023-09-29 NOTE — TOC Progression Note (Addendum)
 Transition of Care [facility]) - Progression Note    Patient Details  Name: Diana Cook MRN: [id number] Date of Birth: 12-May-1950  [facility] of [facility] CM/SW Contact  Alvaro Louder, [place] Phone Number: 09/29/2023, 1:50 PM  Clinical Narrative:  LCSWA faxed out information to SNF's in [place]. Family had a preference for [facility]. LCSWA reached out to [facility] admissions coordinator and confirmed bed availablity.   Insurance Auth started: Pending AuthID: [id number]   8/5 3:55: Auth Approved: PlanAuthID: [id number] Dates:8/5-10/01/2023 Next Review Date:10/01/2023   TOC to follow for discharge.                       Expected Discharge Plan and Services                                               Social Drivers of Health (SDOH) Interventions SDOH Screenings   Food Insecurity: No Food Insecurity (09/26/2023)  Housing: Low Risk  (09/26/2023)  Transportation Needs: No Transportation Needs (09/26/2023)  Utilities: Not At Risk (09/26/2023)  Alcohol Screen: Low Risk  (05/13/2023)  Depression (PHQ2-9): Low Risk  (05/08/2023)  Recent Concern: Depression (PHQ2-9) - Medium Risk (03/25/2023)  Financial Resource Strain: Low Risk  (05/13/2023)  Physical Activity: Inactive (05/13/2023)  Social Connections: Socially Integrated (09/26/2023)  Stress: No Stress Concern Present (05/13/2023)  Tobacco Use: Medium Risk (09/25/2023)  Health Literacy: Adequate Health Literacy (03/06/2023)    Readmission Risk Interventions     No data to display

## 2023-09-30 ENCOUNTER — Ambulatory Visit: Admitting: Family Medicine

## 2023-09-30 DIAGNOSIS — R55 Syncope and collapse: Secondary | ICD-10-CM | POA: Diagnosis not present

## 2023-09-30 LAB — CBC
HCT: 27.8 % — ABNORMAL LOW (ref 36.0–46.0)
Hemoglobin: 9.1 g/dL — ABNORMAL LOW (ref 12.0–15.0)
MCH: 28.6 pg (ref 26.0–34.0)
MCHC: 32.7 g/dL (ref 30.0–36.0)
MCV: 87.4 fL (ref 80.0–100.0)
Platelets: 372 K/uL (ref 150–400)
RBC: 3.18 MIL/uL — ABNORMAL LOW (ref 3.87–5.11)
RDW: 12.9 % (ref 11.5–15.5)
WBC: 7.2 K/uL (ref 4.0–10.5)
nRBC: 0 % (ref 0.0–0.2)

## 2023-09-30 LAB — BASIC METABOLIC PANEL WITH GFR
Anion gap: 9 (ref 5–15)
BUN: 20 mg/dL (ref 8–23)
CO2: 28 mmol/L (ref 22–32)
Calcium: 8.9 mg/dL (ref 8.9–10.3)
Chloride: 99 mmol/L (ref 98–111)
Creatinine, Ser: 0.83 mg/dL (ref 0.44–1.00)
GFR, Estimated: >60 mL/min (ref >60)
Glucose, Bld: 107 mg/dL — ABNORMAL HIGH (ref 70–99)
Potassium: 3.3 mmol/L — ABNORMAL LOW (ref 3.5–5.1)
Sodium: 136 mmol/L (ref 135–145)

## 2023-09-30 LAB — GLUCOSE, CAPILLARY: Glucose-Capillary: 105 mg/dL — ABNORMAL HIGH (ref 70–99)

## 2023-09-30 MED ORDER — FLUDROCORTISONE ACETATE 0.1 MG PO TABS
0.2000 mg | ORAL_TABLET | Freq: Every day | ORAL | Status: DC
  Administered 2023-10-01 – 2023-10-02 (×2): 0.2 mg via ORAL
  Filled 2023-09-30 (×2): qty 2

## 2023-09-30 MED ORDER — POTASSIUM CHLORIDE CRYS ER 20 MEQ PO TBCR
40.0000 meq | EXTENDED_RELEASE_TABLET | Freq: Once | ORAL | Status: AC
  Administered 2023-09-30: 40 meq via ORAL
  Filled 2023-09-30: qty 2

## 2023-09-30 MED ORDER — FLUDROCORTISONE 0.1 MG/ML ORAL SUSPENSION
0.1000 mg | Freq: Once | ORAL | Status: DC
  Filled 2023-09-30: qty 1

## 2023-09-30 MED ORDER — FLUDROCORTISONE ACETATE 0.1 MG PO TABS
0.1000 mg | ORAL_TABLET | Freq: Once | ORAL | Status: AC
  Administered 2023-09-30: 0.1 mg via ORAL
  Filled 2023-09-30: qty 1

## 2023-09-30 NOTE — Progress Notes (Signed)
 Occupational Therapy Treatment Patient Details Name: Diana Cook MRN: [id number] DOB: 17-Jan-1951 Today's Date: 09/30/2023   History of present illness Diana Cook is a 73 y.o. female with medical history significant for COPD on 4 L O2 via [place], CAD/MI, CHB s/p PPM, carotid stenosis s/p stents on Eliquis , seizures, lumbar radiculopathy, OA, breast cancer s/p surgery/RT, HFpEF, and severe orthostatic hypotension for which she was hospitalized a couple weeks prior with a subsequent ED visit at [facility] who is being admitted for syncopal event.  Patient has several brief syncopal events related to orthostatic hypotension usually resolved by lying down however on the night of arrival she had an prolonged syncopal episode that was slow to improve with lying flat.   OT comments  Pt seen for OT treatment on this date. Upon arrival to room pt semi supine with family at bedside, agreeable to tx. Pt completed bed mobility with MODI, with use of the bed rails. Pt STS from the EOB requiring x2 attempts to come to full stance with RW for PRN UE support, CGA throughout for safety. Pt completed full orthostatic vitals becoming fatigued post standing for +46mins. Pt returned to sitting on the EOB, author facilitated LB dressing tasks with use of adaptive equipment. Pt demonstrated LB dressing task with good carryover, eager to purchase reacher and sock aid for use of D/C. Pt making good progress toward goals, will continue to follow POC. Discharge recommendation remains appropriate.    Orthostatics:  Supine BP(legs wrapped) : 155/174, HR 71 Sitting on the EOB (with abdominal binder + leg wrapped):148/64, HR 77 Standing BP (with abdominal binder + leg wrapped): 113/48, HR 77 Standing BP after 3 mins (with abdominal binder + leg wrapped):101/59, HR 88      If plan is discharge home, recommend the following:  A little help with walking and/or transfers;A little help with bathing/dressing/bathroom;Assistance with  cooking/housework;Assist for transportation;Help with stairs or ramp for entrance   Equipment Recommendations  Other (comment)    Recommendations for Other Services      Precautions / Restrictions Precautions Precautions: Fall Recall of Precautions/Restrictions: Intact Precaution/Restrictions Comments: MONITOR ORTHOSTATICS Required Braces or Orthoses: Other Brace Other Brace: ted hose, cam boot for R ankle, ace wrap to L LE, abdmoninal binder Restrictions Weight Bearing Restrictions Per Provider Order: Yes RLE Weight Bearing Per Provider Order: Weight bearing as tolerated       Mobility Bed Mobility Overal bed mobility: Modified Independent             General bed mobility comments: No physical assistance to come to the EOB with use of single UE    Transfers Overall transfer level: Needs assistance Equipment used: Rolling walker (2 wheels) Transfers: Sit to/from Stand Sit to Stand: Contact guard assist           General transfer comment: CGA for safety due to orthostatic, static standing throughout full orthostatics with 1 posterior LOB, able to self correct with use of RW     Balance Overall balance assessment: Needs assistance Sitting-balance support: Feet supported Sitting balance-Leahy Scale: Normal Sitting balance - Comments: steady sitting, reaching outside BOS   Standing balance support: Bilateral upper extremity supported, During functional activity, Reliant on assistive device for balance Standing balance-Leahy Scale: Fair                             ADL either performed or assessed with clinical judgement   ADL Overall ADL's :  Needs assistance/impaired Eating/Feeding: Set up;Sitting                   Lower Body Dressing: Maximal assistance;Bed level Lower Body Dressing Details (indicate cue type and reason): Donning bilateral ace wrap on LEs, and ankle brace                     Communication  Communication Communication: No apparent difficulties   Cognition Arousal: Alert Behavior During Therapy: WFL for tasks assessed/performed Cognition: No apparent impairments             OT - Cognition Comments: A/Ox4                 Following commands: Intact        Cueing   Cueing Techniques: Verbal cues, Tactile cues  Exercises Exercises: Other exercises Other Exercises Other Exercises: Edu: Benefits of abdominal binder and ace wrapped legs, AD while sitting on the EOB with step by step cues for sequencing           General Comments RN in room to assist with obtaining orthostatic vitals    Pertinent Vitals/ Pain       Pain Assessment Pain Assessment: No/denies pain   Frequency  Min 2X/week        Progress Toward Goals  OT Goals(current goals can now be found in the care plan section)  Progress towards OT goals: Progressing toward goals  Acute Rehab OT Goals OT Goal Formulation: With patient Time For Goal Achievement: 09/12/23 Potential to Achieve Goals: Fair ADL Goals Pt Will Perform Grooming: with modified independence;sitting Pt Will Perform Lower Body Dressing: sit to/from stand;with modified independence Pt Will Transfer to Toilet: with supervision;ambulating Pt Will Perform Toileting - Clothing Manipulation and hygiene: with supervision;sit to/from stand  Plan      Co-evaluation    PT/OT/SLP Co-Evaluation/Treatment: Yes            AM-PAC OT 6 Clicks Daily Activity     Outcome Measure   Help from another person eating meals?: None Help from another person taking care of personal grooming?: A Little Help from another person toileting, which includes using toliet, bedpan, or urinal?: A Little Help from another person bathing (including washing, rinsing, drying)?: A Little Help from another person to put on and taking off regular upper body clothing?: A Little Help from another person to put on and taking off regular lower body  clothing?: A Little 6 Click Score: 19    End of Session Equipment Utilized During Treatment: Oxygen   OT Visit Diagnosis: Other abnormalities of gait and mobility (R26.89);Repeated falls (R29.6)   Activity Tolerance Patient tolerated treatment well   Patient Left in bed;with call bell/phone within reach;with bed alarm set   Nurse Communication Mobility status        Time: 8651-8597 OT Time Calculation (min): 14 min  Charges: OT General Charges $OT Visit: 1 Visit OT Treatments $Therapeutic Activity: 8-22 mins  Larraine Colas M.S. OTR/L  09/30/23, 2:33 PM

## 2023-09-30 NOTE — Plan of Care (Signed)
  Problem: Clinical Measurements: Goal: Will remain free from infection Outcome: Progressing   Problem: Activity: Goal: Risk for activity intolerance will decrease Outcome: Progressing   Problem: Nutrition: Goal: Adequate nutrition will be maintained Outcome: Progressing   Problem: Coping: Goal: Level of anxiety will decrease Outcome: Progressing   

## 2023-09-30 NOTE — Progress Notes (Signed)
 PROGRESS NOTE    Diana Cook  FMW:[id number] DOB: Jul 20, 1950 DOA: 09/25/2023 PCP: Randeen Laine LABOR, MD   Assessment & Plan:   Principal Problem:   Syncope and collapse Active Problems:   Elevated troponin   Hypokalemia   Complete heart block s/p pacemaker (HCC)   Carotid stenosis s/p stents   Seizures (HCC)   Pacemaker-[employer]   History of breast cancer   Chronic obstructive pulmonary disease (HCC)   Coronary artery disease involving native coronary artery of native heart without angina pectoris   Chronic heart failure with preserved ejection fraction (HFpEF) (HCC)   Chronic respiratory failure with hypoxia (HCC)   Orthostatic hypotension   Chronic anticoagulation   Ankle pain, right  Assessment and Plan: Syncope: secondary to severe orthostatic hypotension. Continue on mestinon , midodrine , florinef  and abd binder.Still orthostatic today. No symptoms currently. Increased florinef    Right foot pain: CT of the right lower extremity and foot demonstrates likely ligamentous injury.  Ortho surg diagnosed her with an acute right ankle sprain and recommended a cam boot and non-surgical management.  WBAT as per ortho surg. Will need to f/u outpatient w/ ortho surg    Elevated troponin: likely secondary to demand ischemia. Hx of CAD. Continue on aspirin , statin. Cardio signed off already    Hypokalemia: potassium given   Complete heart block s/p pacemaker: interrogated on 7/22 by cardiologist at [facility] HR limit increased   Carotid stenosis: s/p stents. Continue on home dose of eliquis    Seizures: continue on home dose of keppra . Last seizure 6 years ago    Chronic diastolic CHF: appears compensated. Monitor I/Os.   COPD: w/o exacerbation. Bronchodilators prn  Chronic hypoxic respiratory failure: continue on supplemental oxygen , uses 4L [place] chronically   History of breast cancer: management per onco outpatient       DVT prophylaxis: eliquis  Code Status:  full Family Communication:  Disposition Plan: likely d/c to SNF tomorrow   Level of care: Telemetry Medical  Status is: Inpatient Remains inpatient appropriate because: still orthostatic. Likely d/c to SNF tomorrow    Consultants:  cardio  Procedures:   Antimicrobials:    Subjective: Pt c/o fatigue  Objective: Vitals:   09/29/23 2041 09/30/23 0409 09/30/23 0601 09/30/23 0747  BP: 134/76 (!) 142/63  (!) 145/66  Pulse: 75 72  79  Resp:  18  16  Temp: 98 F (36.7 C) 98.8 F (37.1 C)  98.2 F (36.8 C)  TempSrc: Oral Oral    SpO2: 93% 93%  92%  Weight:   94.1 kg   Height:        Intake/Output Summary (Last 24 hours) at 09/30/2023 1014 Last data filed at 09/30/2023 0500 Gross per 24 hour  Intake 480 ml  Output 900 ml  Net -420 ml   Filed Weights   09/28/23 0520 09/29/23 0428 09/30/23 0601  Weight: 92.2 kg 92.6 kg 94.1 kg    Examination:  General exam: Appears calm and comfortable  Respiratory system: Clear to auscultation. Respiratory effort normal. Cardiovascular system: S1 & S2+. No  rubs, gallops or clicks.  Gastrointestinal system: Abdomen is nondistended, soft and nontender.  Normal bowel sounds heard. Central nervous system: Alert and oriented.moves all extremities  Psychiatry: Judgement and insight appear normal. Mood & affect appropriate.     Data Reviewed: I have personally reviewed following labs and imaging studies  CBC: Recent Labs  Lab 09/25/23 2305 09/26/23 0520 09/28/23 0553 09/30/23 0446  WBC 10.2 8.5 7.5 7.2  HGB 10.1*  9.4* 9.9* 9.1*  HCT 31.2* 29.6* 31.6* 27.8*  MCV 88.9 90.5 90.3 87.4  PLT 382 324 343 372   Basic Metabolic Panel: Recent Labs  Lab 09/25/23 2305 09/26/23 0520 09/28/23 0553 09/30/23 0446  NA 136 138 137 136  K 2.6* 2.9* 3.3* 3.3*  CL 97* 102 101 99  CO2 27 27 27 28   GLUCOSE 130* 119* 99 107*  BUN 16 13 13 20   CREATININE 0.91 0.64 0.72 0.83  CALCIUM  9.3 8.6* 8.6* 8.9  MG  --  1.7  --   --     GFR: Estimated Creatinine Clearance: 70.3 mL/min (by C-G formula based on SCr of 0.83 mg/dL). Liver Function Tests: Recent Labs  Lab 09/25/23 2305  AST 17  ALT 12  ALKPHOS 83  BILITOT 0.9  PROT 7.8  ALBUMIN 3.9   No results for input(s): LIPASE, AMYLASE in the last 168 hours. No results for input(s): AMMONIA in the last 168 hours. Coagulation Profile: No results for input(s): INR, PROTIME in the last 168 hours. Cardiac Enzymes: No results for input(s): CKTOTAL, CKMB, CKMBINDEX, TROPONINI in the last 168 hours. BNP (last 3 results) No results for input(s): PROBNP in the last 8760 hours. HbA1C: No results for input(s): HGBA1C in the last 72 hours. CBG: Recent Labs  Lab 09/26/23 0518 09/27/23 0459 09/28/23 0509 09/28/23 2131 09/30/23 0544  GLUCAP 116* 107* 102* 109* 105*   Lipid Profile: No results for input(s): CHOL, HDL, LDLCALC, TRIG, CHOLHDL, LDLDIRECT in the last 72 hours. Thyroid  Function Tests: No results for input(s): TSH, T4TOTAL, FREET4, T3FREE, THYROIDAB in the last 72 hours. Anemia Panel: No results for input(s): VITAMINB12, FOLATE, FERRITIN, TIBC, IRON, RETICCTPCT in the last 72 hours. Sepsis Labs: No results for input(s): PROCALCITON, LATICACIDVEN in the last 168 hours.  No results found for this or any previous visit (from the past 240 hours).       Radiology Studies: EEG adult Result Date: 09/28/2023 Camara, Amadou, MD     09/28/2023  4:37 PM Patient Name: Diana Cook MRN: [id number] Epilepsy Attending: Pastor Falling Referring Physician/Provider: No ref. provider found     Date: 09/28/2023 Duration: 30 minutes Patient history: Patient with severe orthostatic hypotension, presenting with syncope. EEG to evaluate for seizure Level of alertness: Awake, drowsy AEDs during EEG study: LEV Technical aspects: This EEG study was done with scalp electrodes positioned according to the 10-20 International  system of electrode placement. Electrical activity was reviewed with band pass filter of 1-70Hz , sensitivity of 7 uV/mm, display speed of 66mm/sec with a 60Hz  notched filter applied as appropriate. EEG data were recorded continuously and digitally stored.  Video monitoring was available and reviewed as appropriate. Description: The posterior dominant rhythm consists of 9-10 Hz activity of moderate voltage (25-35 uV) seen predominantly in posterior head regions, symmetric and reactive to eye opening and eye closing. Drowsiness was characterized by attenuation of the posterior background rhythm. Sleep was not seen. Hyperventilation and photic stimulation were not performed.   ABNORMALITY -None IMPRESSION: This study is within normal limits. No seizures or epileptiform discharges were seen throughout the recording. A normal interictal EEG does not exclude nor support the diagnosis of epilepsy. Amadou Camara MD Neurology          Scheduled Meds:  apixaban   5 mg Oral BID   atorvastatin   40 mg Oral Daily   diclofenac  Sodium  4 g Topical QID   fludrocortisone   0.1 mg Oral BID   FLUoxetine   20 mg Oral  Daily   levETIRAcetam   250 mg Oral BID   midodrine   5 mg Oral TID WC   pantoprazole   40 mg Oral Daily   pyridostigmine   60 mg Oral TID   sodium chloride  flush  3 mL Intravenous Q12H   Continuous Infusions:   LOS: 1 day        Anthony CHRISTELLA Pouch, MD [facility] Pager [phone]  If 7PM-7AM, please contact night-coverage [email or web address] 09/30/2023, 10:14 AM

## 2023-09-30 NOTE — Plan of Care (Signed)
  Problem: Education: Goal: Knowledge of condition and prescribed therapy will improve Outcome: Progressing   Problem: Education: Goal: Knowledge of General Education information will improve Description: Including pain rating scale, medication(s)/side effects and non-pharmacologic comfort measures Outcome: Progressing   Problem: Clinical Measurements: Goal: Will remain free from infection Outcome: Progressing   Problem: Nutrition: Goal: Adequate nutrition will be maintained Outcome: Progressing   Problem: Coping: Goal: Level of anxiety will decrease Outcome: Progressing   Problem: Pain Managment: Goal: General experience of comfort will improve and/or be controlled Outcome: Progressing   Problem: Safety: Goal: Ability to remain free from injury will improve Outcome: Progressing   Problem: Skin Integrity: Goal: Risk for impaired skin integrity will decrease Outcome: Progressing

## 2023-10-01 DIAGNOSIS — R55 Syncope and collapse: Secondary | ICD-10-CM | POA: Diagnosis not present

## 2023-10-01 LAB — CBC
HCT: 31.7 % — ABNORMAL LOW (ref 36.0–46.0)
Hemoglobin: 10.2 g/dL — ABNORMAL LOW (ref 12.0–15.0)
MCH: 27.9 pg (ref 26.0–34.0)
MCHC: 32.2 g/dL (ref 30.0–36.0)
MCV: 86.8 fL (ref 80.0–100.0)
Platelets: 386 K/uL (ref 150–400)
RBC: 3.65 MIL/uL — ABNORMAL LOW (ref 3.87–5.11)
RDW: 12.9 % (ref 11.5–15.5)
WBC: 7.7 K/uL (ref 4.0–10.5)
nRBC: 0 % (ref 0.0–0.2)

## 2023-10-01 LAB — BASIC METABOLIC PANEL WITH GFR
Anion gap: 10 (ref 5–15)
BUN: 14 mg/dL (ref 8–23)
CO2: 25 mmol/L (ref 22–32)
Calcium: 8.9 mg/dL (ref 8.9–10.3)
Chloride: 102 mmol/L (ref 98–111)
Creatinine, Ser: 0.67 mg/dL (ref 0.44–1.00)
GFR, Estimated: >60 mL/min (ref >60)
Glucose, Bld: 110 mg/dL — ABNORMAL HIGH (ref 70–99)
Potassium: 3.2 mmol/L — ABNORMAL LOW (ref 3.5–5.1)
Sodium: 137 mmol/L (ref 135–145)

## 2023-10-01 LAB — GLUCOSE, CAPILLARY: Glucose-Capillary: 115 mg/dL — ABNORMAL HIGH (ref 70–99)

## 2023-10-01 MED ORDER — BISACODYL 5 MG PO TBEC
10.0000 mg | DELAYED_RELEASE_TABLET | Freq: Every day | ORAL | Status: DC | PRN
  Administered 2023-10-01: 10 mg via ORAL
  Filled 2023-10-01: qty 2

## 2023-10-01 MED ORDER — POTASSIUM CHLORIDE CRYS ER 20 MEQ PO TBCR
40.0000 meq | EXTENDED_RELEASE_TABLET | Freq: Once | ORAL | Status: AC
  Administered 2023-10-01: 40 meq via ORAL
  Filled 2023-10-01: qty 2

## 2023-10-01 NOTE — TOC Progression Note (Signed)
 Transition of Care [facility]) - Progression Note    Patient Details  Name: Diana Cook MRN: [id number] Date of Birth: [birth year]  [facility] CM/SW Contact  Alvaro Louder, [place] Phone Number: 10/01/2023, 4:16 PM  Clinical Narrative:   Patient will be going to [facility] tomorrow when medically stable.   TOC to follow For discharge                      Expected Discharge Plan and Services                                               Social Drivers of Health (SDOH) Interventions SDOH Screenings   Food Insecurity: No Food Insecurity (09/26/2023)  Housing: Low Risk  (09/26/2023)  Transportation Needs: No Transportation Needs (09/26/2023)  Utilities: Not At Risk (09/26/2023)  Alcohol Screen: Low Risk  (05/13/2023)  Depression (PHQ2-9): Low Risk  (05/08/2023)  Recent Concern: Depression (PHQ2-9) - Medium Risk (03/25/2023)  Financial Resource Strain: Low Risk  (05/13/2023)  Physical Activity: Inactive (05/13/2023)  Social Connections: Socially Integrated (09/26/2023)  Stress: No Stress Concern Present (05/13/2023)  Tobacco Use: Medium Risk (09/25/2023)  Health Literacy: Adequate Health Literacy (03/06/2023)    Readmission Risk Interventions     No data to display

## 2023-10-01 NOTE — Progress Notes (Addendum)
 PROGRESS NOTE    Diana Cook  FMW:[id number] DOB: 1950/08/27 DOA: 09/25/2023 PCP: Randeen Laine LABOR, MD   Assessment & Plan:   Principal Problem:   Syncope and collapse Active Problems:   Elevated troponin   Hypokalemia   Complete heart block s/p pacemaker (HCC)   Carotid stenosis s/p stents   Seizures (HCC)   Pacemaker-[employer]   History of breast cancer   Chronic obstructive pulmonary disease (HCC)   Coronary artery disease involving native coronary artery of native heart without angina pectoris   Chronic heart failure with preserved ejection fraction (HFpEF) (HCC)   Chronic respiratory failure with hypoxia (HCC)   Orthostatic hypotension   Chronic anticoagulation   Ankle pain, right  Assessment and Plan: Syncope: secondary to severe orthostatic hypotension. Continue on mestinon , midodrine , florinef  and abd binder. Still orthostatic today. Had a vasovagal episode while on the bedside commode & tech was at bedside w/ pt. Will hold d/c today then.   Right foot pain: CT of the right lower extremity and foot demonstrates likely ligamentous injury.  Ortho surg diagnosed her with an acute right ankle sprain and recommended a cam boot and non-surgical management.  WBAT as per ortho surg. Will need to f/u outpatient w/ ortho surg    Elevated troponin: likely secondary to demand ischemia. Hx of CAD. Continue on statin, aspirin . Cardio signed off already    Hypokalemia: KCL given. Will check Mg in AM    Complete heart block s/p pacemaker: interrogated on 7/22 by cardiologist at [facility] HR limit increased   Carotid stenosis: s/p stents. Continue on home dose of eliquis     Seizures: continue on home dose of keppra . Last seizure 6 years ago    Chronic diastolic CHF: appears compensated. Monitor I/Os   COPD: w/o exacerbation. Bronchodilators prn   Chronic hypoxic respiratory failure: continue on supplemental oxygen , uses 4L [place] chronically   History of breast cancer:  management per onco outpatinet        DVT prophylaxis: eliquis  Code Status: full Family Communication:  Disposition Plan: d/c held today for vasovagal episode while on bedside commode, will potentially d/c tomorrow   Level of care: Telemetry Medical  Status is: Inpatient Remains inpatient appropriate because: d/c held today for vasovagal episode while on bedside commode, will potentially d/c tomorrow     Consultants:  cardio  Procedures:   Antimicrobials:    Subjective: Pt c/o dizziness  Objective: Vitals:   10/01/23 0302 10/01/23 0500 10/01/23 0757 10/01/23 1111  BP: (!) 151/77  139/76 139/76  Pulse: 82  78 93  Resp: 19  16 20   Temp: 98.4 F (36.9 C)  98 F (36.7 C) 97.7 F (36.5 C)  TempSrc: Oral  Oral Oral  SpO2: 90%  92% 95%  Weight:  93.7 kg    Height:        Intake/Output Summary (Last 24 hours) at 10/01/2023 1437 Last data filed at 10/01/2023 0900 Gross per 24 hour  Intake 240 ml  Output 175 ml  Net 65 ml   Filed Weights   09/29/23 0428 09/30/23 0601 10/01/23 0500  Weight: 92.6 kg 94.1 kg 93.7 kg    Examination:  General exam: Appears comfortable  Respiratory system: decreased breath sounds b/l . Cardiovascular system: S1/S2+. No rubs or clicks  Gastrointestinal system: Abd is soft, NT, obese & normal bowel sounds Central nervous system: alert & oriented. Moves all extremities  Psychiatry: judgement and insight appears normal. Flat mood and affect  Data Reviewed: I have personally reviewed following labs and imaging studies  CBC: Recent Labs  Lab 09/25/23 2305 09/26/23 0520 09/28/23 0553 09/30/23 0446 10/01/23 0523  WBC 10.2 8.5 7.5 7.2 7.7  HGB 10.1* 9.4* 9.9* 9.1* 10.2*  HCT 31.2* 29.6* 31.6* 27.8* 31.7*  MCV 88.9 90.5 90.3 87.4 86.8  PLT 382 324 343 372 386   Basic Metabolic Panel: Recent Labs  Lab 09/25/23 2305 09/26/23 0520 09/28/23 0553 09/30/23 0446 10/01/23 0523  NA 136 138 137 136 137  K 2.6* 2.9* 3.3* 3.3*  3.2*  CL 97* 102 101 99 102  CO2 27 27 27 28 25   GLUCOSE 130* 119* 99 107* 110*  BUN 16 13 13 20 14   CREATININE 0.91 0.64 0.72 0.83 0.67  CALCIUM  9.3 8.6* 8.6* 8.9 8.9  MG  --  1.7  --   --   --    GFR: Estimated Creatinine Clearance: 72.7 mL/min (by C-G formula based on SCr of 0.67 mg/dL). Liver Function Tests: Recent Labs  Lab 09/25/23 2305  AST 17  ALT 12  ALKPHOS 83  BILITOT 0.9  PROT 7.8  ALBUMIN 3.9   No results for input(s): LIPASE, AMYLASE in the last 168 hours. No results for input(s): AMMONIA in the last 168 hours. Coagulation Profile: No results for input(s): INR, PROTIME in the last 168 hours. Cardiac Enzymes: No results for input(s): CKTOTAL, CKMB, CKMBINDEX, TROPONINI in the last 168 hours. BNP (last 3 results) No results for input(s): PROBNP in the last 8760 hours. HbA1C: No results for input(s): HGBA1C in the last 72 hours. CBG: Recent Labs  Lab 09/27/23 0459 09/28/23 0509 09/28/23 2131 09/30/23 0544 10/01/23 0512  GLUCAP 107* 102* 109* 105* 115*   Lipid Profile: No results for input(s): CHOL, HDL, LDLCALC, TRIG, CHOLHDL, LDLDIRECT in the last 72 hours. Thyroid  Function Tests: No results for input(s): TSH, T4TOTAL, FREET4, T3FREE, THYROIDAB in the last 72 hours. Anemia Panel: No results for input(s): VITAMINB12, FOLATE, FERRITIN, TIBC, IRON, RETICCTPCT in the last 72 hours. Sepsis Labs: No results for input(s): PROCALCITON, LATICACIDVEN in the last 168 hours.  No results found for this or any previous visit (from the past 240 hours).       Radiology Studies: No results found.       Scheduled Meds:  apixaban   5 mg Oral BID   atorvastatin   40 mg Oral Daily   diclofenac  Sodium  4 g Topical QID   fludrocortisone   0.2 mg Oral Daily   FLUoxetine   20 mg Oral Daily   levETIRAcetam   250 mg Oral BID   midodrine   5 mg Oral TID WC   pantoprazole   40 mg Oral Daily   pyridostigmine    60 mg Oral TID   sodium chloride  flush  3 mL Intravenous Q12H   Continuous Infusions:   LOS: 2 days        Anthony CHRISTELLA Pouch, MD [facility] Pager [phone]  If 7PM-7AM, please contact night-coverage [email or web address] 10/01/2023, 2:37 PM

## 2023-10-01 NOTE — Plan of Care (Signed)

## 2023-10-01 NOTE — Progress Notes (Signed)
   10/01/23 1111  What Happened  Was fall witnessed? Yes  Who witnessed fall?  Alston Mathison (NT), [employer] [place]  Patients activity before fall bathroom-assisted;other (comment);to/from bed, chair, or stretcher [facility])  Point of contact other (comment) (Nil)  Was patient injured? No  Provider Notification  Provider Name/Title Anthony CHRISTELLA Pouch  Date Provider Notified 10/01/23  Time Provider Notified 1100  Method of Notification Rounds (MD was on ward)  Notification Reason Fall  Provider response Other (Comment) (Schedule Midodrine  given)  Date of Provider Response 10/01/23  Time of Provider Response 1101  Follow Up  Family notified Yes - comment  Time family notified 1130 (MD notified husband and RN)  Progress note created (see row info) Yes  Adult Fall Risk Assessment  Risk Factor Category (scoring not indicated) High fall risk per protocol (document High fall risk)  Patient Fall Risk Level High fall risk  Adult Fall Risk Interventions  Required Bundle Interventions *See Row Information* High fall risk - low, moderate, and high requirements implemented  Additional Interventions Use of appropriate toileting equipment (bedpan, BSC, etc.);Room near nurses station;PT/OT need assessed if change in mobility from baseline  Screening for Fall Injury Risk (To be completed on HIGH fall risk patients) - Assessing Need for Floor Mats  Risk For Fall Injury- Criteria for Floor Mats Admitted as a result of a fall  Will Implement Floor Mats Yes  Vitals  Temp 97.7 F (36.5 C)  Temp Source Oral  BP 139/76  MAP (mmHg) 90  BP Method Automatic  Pulse Rate 93  Resp 20  Oxygen  Therapy  SpO2 95 %  O2 Device Nasal Cannula  Pain Assessment  Pain Scale 0-10  Pain Score 0  PAINAD (Pain Assessment in Advanced Dementia)  Breathing 0  Negative Vocalization 0  Facial Expression 0  Body Language 0  Consolability 0  PAINAD Score 0  Neurological  Neuro (WDL) WDL  Level of  Consciousness Alert  Orientation Level Oriented X4  Cognition Appropriate at baseline  Speech Appropriate at baseline  R Pupil Size (mm) 3  R Pupil Shape Round  R Pupil Reaction Brisk  L Pupil Size (mm) 3  L Pupil Shape Round  L Pupil Reaction Brisk  R Hand Grip Strong  L Hand Grip Strong  R Foot Dorsiflexion Moderate  L Foot Dorsiflexion Moderate  R Foot Plantar Flexion Moderate  L Foot Plantar Flexion Moderate  RUE Motor Response Purposeful movement  RUE Motor Strength 4  LUE Motor Response Purposeful movement  LUE Motor Strength 4  RLE Motor Response Purposeful movement  RLE Motor Strength 4  LLE Motor Response Purposeful movement  LLE Motor Strength 4  Neuro Symptoms Fatigue  Glasgow Coma Scale  Eye Opening 4  Best Verbal Response (NON-intubated) 5  Best Motor Response 6  Glasgow Coma Scale Score 15  Musculoskeletal  Musculoskeletal (WDL) X  Assistive Device Front wheel walker  Generalized Weakness Yes  Weight Bearing Restrictions Per Provider Order Yes  RLE Weight Bearing Per Provider Order WBAT  Musculoskeletal Details  RUE Full movement  LUE Full movement  RLE Limited movement;Weakness  LLE Limited movement;Weakness  Integumentary  Integumentary (WDL) X  Skin Color Appropriate for ethnicity  Skin Condition Dry  Skin Integrity Ecchymosis  Ecchymosis Location Leg;Buttocks  Ecchymosis Location Orientation Bilateral  Skin Turgor Non-tenting

## 2023-10-02 DIAGNOSIS — R55 Syncope and collapse: Secondary | ICD-10-CM | POA: Diagnosis not present

## 2023-10-02 LAB — BASIC METABOLIC PANEL WITH GFR
Anion gap: 9 (ref 5–15)
BUN: 13 mg/dL (ref 8–23)
CO2: 28 mmol/L (ref 22–32)
Calcium: 9 mg/dL (ref 8.9–10.3)
Chloride: 100 mmol/L (ref 98–111)
Creatinine, Ser: 0.75 mg/dL (ref 0.44–1.00)
GFR, Estimated: >60 mL/min (ref >60)
Glucose, Bld: 110 mg/dL — ABNORMAL HIGH (ref 70–99)
Potassium: 3.5 mmol/L (ref 3.5–5.1)
Sodium: 137 mmol/L (ref 135–145)

## 2023-10-02 LAB — CBC
HCT: 30.2 % — ABNORMAL LOW (ref 36.0–46.0)
Hemoglobin: 9.7 g/dL — ABNORMAL LOW (ref 12.0–15.0)
MCH: 28.4 pg (ref 26.0–34.0)
MCHC: 32.1 g/dL (ref 30.0–36.0)
MCV: 88.3 fL (ref 80.0–100.0)
Platelets: 381 K/uL (ref 150–400)
RBC: 3.42 MIL/uL — ABNORMAL LOW (ref 3.87–5.11)
RDW: 13.1 % (ref 11.5–15.5)
WBC: 8.5 K/uL (ref 4.0–10.5)
nRBC: 0 % (ref 0.0–0.2)

## 2023-10-02 LAB — GLUCOSE, CAPILLARY: Glucose-Capillary: 107 mg/dL — ABNORMAL HIGH (ref 70–99)

## 2023-10-02 MED ORDER — FLUDROCORTISONE ACETATE 0.1 MG PO TABS: 0.2000 mg | ORAL_TABLET | Freq: Every day | ORAL | 0 refills | Status: AC

## 2023-10-02 MED ORDER — TRAMADOL HCL 50 MG PO TABS: 50.0000 mg | ORAL_TABLET | Freq: Four times a day (QID) | ORAL | 0 refills | Status: AC | PRN

## 2023-10-02 NOTE — Care Management Important Message (Signed)
 Important Message  Patient Details  Name: Diana Cook MRN: [id number] Date of Birth: Dec 12, 1950   Important Message Given:  Yes - Medicare IM     Kieryn Burtis W, CMA 10/02/2023, 11:50 AM

## 2023-10-02 NOTE — Plan of Care (Signed)
  Problem: Education: Goal: Knowledge of condition and prescribed therapy will improve Outcome: Progressing   Problem: Education: Goal: Knowledge of General Education information will improve Description: Including pain rating scale, medication(s)/side effects and non-pharmacologic comfort measures Outcome: Progressing   Problem: Clinical Measurements: Goal: Diagnostic test results will improve Outcome: Progressing   Problem: Activity: Goal: Risk for activity intolerance will decrease Outcome: Progressing

## 2023-10-02 NOTE — TOC Transition Note (Signed)
 Transition of Care [facility]) - Discharge Note   Patient Details  Name: Diana Cook MRN: [id number] Date of Birth: 20-Jul-1950  Transition of [facility]) CM/SW Contact:  Alvaro Louder, LCSW Phone Number: 10/02/2023, 12:46 PM   Clinical Narrative:  ORDTJ recived insurance approval for patient to admit to SNF [facility]. LCSWA confirmed with MD that patient is stable for DC. LCSWA notified patient and they are in agreement with discharge. LCSWA confirmed bed is available at [facility] Transport arranged for next available.   Room: 108, Number to call report [phone]   [facility] Signing off    Final next level of care: Skilled Nursing Facility Barriers to Discharge: No Barriers Identified   Patient Goals and CMS Choice            Discharge Placement              Patient chooses bed at: [facility] Patient to be transferred to facility by: [facility] Name of family member notified: Self Patient and family notified of of transfer: 10/02/23  Discharge Plan and Services Additional resources added to the After Visit Summary for                                       Social Drivers of Health (SDOH) Interventions SDOH Screenings   Food Insecurity: No Food Insecurity (09/26/2023)  Housing: Low Risk  (09/26/2023)  Transportation Needs: No Transportation Needs (09/26/2023)  Utilities: Not At Risk (09/26/2023)  Alcohol Screen: Low Risk  (05/13/2023)  Depression (PHQ2-9): Low Risk  (05/08/2023)  Recent Concern: Depression (PHQ2-9) - Medium Risk (03/25/2023)  Financial Resource Strain: Low Risk  (05/13/2023)  Physical Activity: Inactive (05/13/2023)  Social Connections: Socially Integrated (09/26/2023)  Stress: No Stress Concern Present (05/13/2023)  Tobacco Use: Medium Risk (09/25/2023)  Health Literacy: Adequate Health Literacy (03/06/2023)     Readmission Risk Interventions     No data to display

## 2023-10-02 NOTE — Discharge Summary (Addendum)
 Physician Discharge Summary  Diana Cook FMW:[id number] DOB: [birth year] DOA: 09/25/2023  PCP: Randeen Laine LABOR, MD  Admit date: 09/25/2023 Discharge date: 10/02/2023  Admitted From: SNF Disposition: SNF  Recommendations for Outpatient Follow-up:  Follow up with PCP in 1-2 weeks F/u w/ ortho surg, Dr. Edie, in 1-2 weeks F/u w/ cardio, Dr. Mady, in 2-3 weeks   Home Health: no  Equipment/Devices: 4L [place] chronically   Discharge Condition: stable  CODE STATUS: full  Diet recommendation: Heart Healthy  Brief/Interim Summary: HPI was taken from Dr. Cleatus: Diana Cook is a 73 y.o. female with medical history significant for COPD on 4 L O2 via [place], CAD/MI, CHB s/p PPM, carotid stenosis s/p stents on Eliquis , seizures, lumbar radiculopathy, OA, breast cancer s/p surgery/RT, HFpEF, and severe orthostatic hypotension for which she was hospitalized a couple weeks prior with a subsequent ED visit at [facility] who is being admitted for syncopal event.  Patient has several brief syncopal events related to orthostatic hypotension usually resolved by lying down however on the night of arrival she had an prolonged syncopal episode that was slow to improve with lying flat.  She eventually came around after 10 to 15 minutes.  She had no jerking or seizure-like activity.  She is currently midodrine  and fludrocortisone  and an abdominal binder.  She had a recent pacemaker interrogation with increase limit on PPM for HR,.  She is awaiting a [facility] cardiology follow-up appointment. Arrival in the ED vitals were within normal limits.  Orthostatics not rechecked.  Labs pertinent for troponin of 81 and potassium of 2.6.  Hemoglobin was at baseline at 10.1 EKG showed sinus at 86 Patient treated with Zofran   Admission requested   Discharge Diagnoses:  Principal Problem:   Syncope and collapse Active Problems:   Elevated troponin   Hypokalemia   Complete heart block s/p pacemaker (HCC)   Carotid stenosis s/p stents    Seizures (HCC)   Pacemaker-[employer]   History of breast cancer   Chronic obstructive pulmonary disease (HCC)   Coronary artery disease involving native coronary artery of native heart without angina pectoris   Chronic heart failure with preserved ejection fraction (HFpEF) (HCC)   Chronic respiratory failure with hypoxia (HCC)   Orthostatic hypotension   Chronic anticoagulation   Ankle pain, right  Syncope: secondary to severe orthostatic hypotension. Continue on mestinon , midodrine , florinef  and abd binder. No symptoms so far today    Right foot pain: CT of the right lower extremity and foot demonstrates likely ligamentous injury.  Ortho surg diagnosed her with an acute right ankle sprain and recommended a cam boot and non-surgical management.  WBAT as per ortho surg. Will need to f/u outpatient w/ ortho surg    Elevated troponin: likely secondary to demand ischemia. Hx of CAD. Continue on statin, aspirin . Cardio signed off already    Hypokalemia: WNL today    Complete heart block s/p pacemaker: interrogated on 7/22 by cardiologist at [facility] HR limit increased   Carotid stenosis: s/p stents. Continue on home dose of eliquis , aspirin     Seizures: continue on home dose of keppra . Last seizure 6 years ago    Chronic diastolic CHF: appears compensated. Monitor I/Os   COPD: w/o exacerbation. Bronchodilators prn    Chronic hypoxic respiratory failure: continue on supplemental oxygen , uses 4L [place] chronically    History of breast cancer: management per [facility] outpatinet   Discharge Instructions  Discharge Instructions     Diet - low sodium heart healthy  Complete by: As directed    Discharge instructions   Complete by: As directed    F/u w/ PCP in 1-2 weeks. F/u w/ cardio, Dr. Mady, in 2-3 weeks.   Discharge instructions   Complete by: As directed    F/u w/ ortho surg, Dr. Edie, in 1-2 weeks   Increase activity slowly   Complete by: As directed        Allergies as of 10/02/2023       Reactions   Codeine Nausea And Vomiting   Morphine  And Codeine Nausea Only        Medication List     TAKE these medications    acetaminophen  500 MG tablet Commonly known as: TYLENOL  Take 500-1,000 mg by mouth every 6 (six) hours as needed for mild pain or headache.   albuterol  108 (90 Base) MCG/ACT inhaler Commonly known as: VENTOLIN  HFA INHALE 2 PUFFS BY MOUTH EVERY 4 HOURS AS NEEDED FOR WHEEZING OR SHORTNESS OF BREATH   apixaban  5 MG Tabs tablet Commonly known as: Eliquis  Take 1 tablet (5 mg total) by mouth 2 (two) times daily.   aspirin  EC 81 MG tablet Take 1 tablet (81 mg total) by mouth daily.   atorvastatin  40 MG tablet Commonly known as: LIPITOR Take 1 tablet by mouth once daily   benzonatate  200 MG capsule Commonly known as: TESSALON  Take 1 capsule (200 mg total) by mouth 2 (two) times daily as needed for cough.   fludrocortisone  0.1 MG tablet Commonly known as: FLORINEF  Take 2 tablets (0.2 mg total) by mouth daily. Start taking on: October 03, 2023 What changed:  how much to take when to take this   FLUoxetine  20 MG capsule Commonly known as: PROZAC  Take 1 capsule by mouth once daily   gabapentin  100 MG capsule Commonly known as: NEURONTIN  Take 200 mg by mouth 2 (two) times daily as needed.   levETIRAcetam  250 MG tablet Commonly known as: KEPPRA  Take 250 mg by mouth 2 (two) times daily.   midodrine  10 MG tablet Commonly known as: PROAMATINE  Take 1 tablet (10 mg total) by mouth 3 (three) times daily with meals.   OXYGEN  Inhale 4 L into the lungs continuous.   pantoprazole  40 MG tablet Commonly known as: Protonix  Take 1 tablet (40 mg total) by mouth daily.   potassium chloride  10 MEQ tablet Commonly known as: KLOR-CON  M Take 10 mEq by mouth daily.   pyridostigmine  60 MG tablet Commonly known as: Mestinon  Take 1 tablet (60 mg total) by mouth 3 (three) times daily.   Stiolto Respimat  2.5-2.5 MCG/ACT  Aers Generic drug: Tiotropium Bromide-Olodaterol INHALE 2 PUFFS BY MOUTH ONCE DAILY   traMADol  50 MG tablet Commonly known as: ULTRAM  Take 1 tablet (50 mg total) by mouth every 6 (six) hours as needed for up to 2 days for moderate pain (pain score 4-6) or severe pain (pain score 7-10).   Vitamin D3 50 MCG (2000 UT) capsule Take 2,000 Units by mouth daily.   zolpidem  10 MG tablet Commonly known as: AMBIEN  TAKE 1/2 TO 1 (ONE-HALF TO ONE) TABLET BY MOUTH AT BEDTIME AS NEEDED FOR SLEEP        Contact information for after-discharge care     Destination     [facility] .   Service: Skilled Nursing Contact information: [address] [place]  [phone]                    Allergies  Allergen Reactions  Codeine Nausea And Vomiting   Morphine  And Codeine Nausea Only    Consultations: Cardio Ortho surg    Procedures/Studies: EEG adult Result Date: 09/28/2023 Gregg Lek, MD     09/28/2023  4:37 PM Patient Name: Diana Cook MRN: [id number] Epilepsy Attending: Lek Gregg Referring Physician/Provider: No ref. provider found     Date: 09/28/2023 Duration: 30 minutes Patient history: Patient with severe orthostatic hypotension, presenting with syncope. EEG to evaluate for seizure Level of alertness: Awake, drowsy AEDs during EEG study: LEV Technical aspects: This EEG study was done with scalp electrodes positioned according to the 10-20 International system of electrode placement. Electrical activity was reviewed with band pass filter of 1-70Hz , sensitivity of 7 uV/mm, display speed of 17mm/sec with a 60Hz  notched filter applied as appropriate. EEG data were recorded continuously and digitally stored.  Video monitoring was available and reviewed as appropriate. Description: The posterior dominant rhythm consists of 9-10 Hz activity of moderate voltage (25-35 uV) seen predominantly in posterior head regions, symmetric and reactive to eye opening  and eye closing. Drowsiness was characterized by attenuation of the posterior background rhythm. Sleep was not seen. Hyperventilation and photic stimulation were not performed.   ABNORMALITY -None IMPRESSION: This study is within normal limits. No seizures or epileptiform discharges were seen throughout the recording. A normal interictal EEG does not exclude nor support the diagnosis of epilepsy. Lek Gregg MD Neurology     DG Ankle Complete Right Result Date: 09/26/2023 CLINICAL DATA:  Pain.  Fall. EXAM: RIGHT ANKLE - COMPLETE 3+ VIEW COMPARISON:  None Available. FINDINGS: Punctate linear foci of mineralization adjacent to the tip of the medial malleolus are suspicious for ligamentous injury. The remainder of the visualized bones appear intact. No malalignment. Ankle mortise is congruent. Joint spaces are relatively maintained. Soft tissue swelling along the medial ankle. IMPRESSION: Punctate linear foci of mineralization adjacent to the tip of the medial malleolus are suspicious for ligamentous injury. Overlying soft tissue swelling. Electronically Signed   By: Harrietta Sherry M.D.   On: 09/26/2023 16:22   CUP PACEART REMOTE DEVICE CHECK Result Date: 09/14/2023 PPM Scheduled remote reviewed. Normal device function.  Presenting rhythm: AS-VP. Recent increase in avg resp rate to 26-30 rpm per trend.  1 AHR lasting 2 seconds. Next remote 91 days. - CS, CVRS  CT Cervical Spine Wo Contrast Result Date: 09/14/2023 CLINICAL DATA:  Trauma, cervical spine trauma, multiple syncopal episodes. EXAM: CT CERVICAL SPINE WITHOUT CONTRAST TECHNIQUE: Multidetector CT imaging of the cervical spine was performed without intravenous contrast. Multiplanar CT image reconstructions were also generated. RADIATION DOSE REDUCTION: This exam was performed according to the departmental dose-optimization program which includes automated exposure control, adjustment of the mA and/or kV according to patient size and/or use of  iterative reconstruction technique. COMPARISON:  CT cervical spine 08/28/2023. FINDINGS: Alignment: Straightening of the normal cervical lordosis. Similar anterolisthesis of C4 on C5 likely related to degenerative changes. No facet subluxation or dislocation. Skull base and vertebrae: No compression fracture or displaced fracture in the cervical spine. No suspicious osseous lesion. Degenerative endplate changes most pronounced at C5-6 and C6-7. Degenerative changes noted in the temporomandibular joints bilaterally. Soft tissues and spinal canal: No prevertebral fluid or swelling. No visible canal hematoma. 1.4 cm nodule in the left thyroid  lobe. Disc levels: Disc space narrowing most pronounced at C6-7. Degenerative endplate osteophytes. Facet arthrosis at multiple levels. No high-grade osseous spinal canal stenosis. Foraminal stenosis at multiple levels. Upper chest: Emphysema. Other: Redemonstrated right carotid stent.  IMPRESSION: No compression fracture or displaced fracture in the cervical spine. Electronically Signed   By: Donnice Mania M.D.   On: 09/14/2023 12:17   CT HEAD WO CONTRAST ( ) Result Date: 09/14/2023 CLINICAL DATA:  Head trauma, intracranial arterial injury suspected EXAM: CT HEAD WITHOUT CONTRAST TECHNIQUE: Contiguous axial images were obtained from the base of the skull through the vertex without intravenous contrast. RADIATION DOSE REDUCTION: This exam was performed according to the departmental dose-optimization program which includes automated exposure control, adjustment of the mA and/or kV according to patient size and/or use of iterative reconstruction technique. COMPARISON:  CT of the head dated August 28, 2023. FINDINGS: Brain: There is age related atrophy and mild periventricular and deep cerebral white matter disease. There is no evidence of acute intracranial injury. There is no evidence of hemorrhage, mass, cortical infarct or hydrocephalus. Vascular: Negative. Skull: Intact and  unremarkable. There is mild left forehead soft tissue swelling. Sinuses/Orbits: Clear paranasal sinuses and mastoid air cells. Status post bilateral lens replacement. Other: None. IMPRESSION: 1. Age-related atrophy and mild cerebral white matter disease. No evidence of acute intracranial injury. Electronically Signed   By: Evalene Coho M.D.   On: 09/14/2023 11:14   (Echo, Carotid, EGD, Colonoscopy, ERCP)    Subjective: Pt c/o soreness.    Discharge Exam: Vitals:   10/02/23 0525 10/02/23 0819  BP: 131/61 132/63  Pulse: 69 66  Resp: 18 17  Temp: 98 F (36.7 C) 98 F (36.7 C)  SpO2: 92% 92%   Vitals:   10/01/23 1945 10/02/23 0500 10/02/23 0525 10/02/23 0819  BP: (!) 147/67  131/61 132/63  Pulse: 74  69 66  Resp: 16  18 17   Temp: 98.1 F (36.7 C)  98 F (36.7 C) 98 F (36.7 C)  TempSrc: Oral     SpO2: 92%  92% 92%  Weight:  93 kg    Height:        General: Pt is alert, awake, not in acute distress Cardiovascular: S1/S2 +, no rubs, no gallops Respiratory: decreased breath sounds b/l  Abdominal: Soft, NT, obese, bowel sounds + Extremities: no cyanosis    The results of significant diagnostics from this hospitalization (including imaging, microbiology, ancillary and laboratory) are listed below for reference.     Microbiology: No results found for this or any previous visit (from the past 240 hours).   Labs: BNP (last 3 results) No results for input(s): BNP in the last 8760 hours. Basic Metabolic Panel: Recent Labs  Lab 09/26/23 0520 09/28/23 0553 09/30/23 0446 10/01/23 0523 10/02/23 0236  NA 138 137 136 137 137  K 2.9* 3.3* 3.3* 3.2* 3.5  CL 102 101 99 102 100  CO2 27 27 28 25 28   GLUCOSE 119* 99 107* 110* 110*  BUN 13 13 20 14 13   CREATININE 0.64 0.72 0.83 0.67 0.75  CALCIUM  8.6* 8.6* 8.9 8.9 9.0  MG 1.7  --   --   --   --    Liver Function Tests: Recent Labs  Lab 09/25/23 2305  AST 17  ALT 12  ALKPHOS 83  BILITOT 0.9  PROT 7.8  ALBUMIN  3.9   No results for input(s): LIPASE, AMYLASE in the last 168 hours. No results for input(s): AMMONIA in the last 168 hours. CBC: Recent Labs  Lab 09/26/23 0520 09/28/23 0553 09/30/23 0446 10/01/23 0523 10/02/23 0236  WBC 8.5 7.5 7.2 7.7 8.5  HGB 9.4* 9.9* 9.1* 10.2* 9.7*  HCT 29.6* 31.6* 27.8* 31.7* 30.2*  MCV 90.5 90.3 87.4 86.8 88.3  PLT 324 343 372 386 381   Cardiac Enzymes: No results for input(s): CKTOTAL, CKMB, CKMBINDEX, TROPONINI in the last 168 hours. BNP: Invalid input(s): POCBNP CBG: Recent Labs  Lab 09/28/23 0509 09/28/23 2131 09/30/23 0544 10/01/23 0512 10/02/23 0529  GLUCAP 102* 109* 105* 115* 107*   D-Dimer No results for input(s): DDIMER in the last 72 hours. Hgb A1c No results for input(s): HGBA1C in the last 72 hours. Lipid Profile No results for input(s): CHOL, HDL, LDLCALC, TRIG, CHOLHDL, LDLDIRECT in the last 72 hours. Thyroid  function studies No results for input(s): TSH, T4TOTAL, T3FREE, THYROIDAB in the last 72 hours.  Invalid input(s): FREET3 Anemia work up No results for input(s): VITAMINB12, FOLATE, FERRITIN, TIBC, IRON, RETICCTPCT in the last 72 hours. Urinalysis    Component Value Date/Time   COLORURINE YELLOW (A) 08/29/2023 2050   APPEARANCEUR CLOUDY (A) 08/29/2023 2050   APPEARANCEUR Clear 04/01/2013 0815   LABSPEC 1.024 08/29/2023 2050   LABSPEC >1.060 04/01/2013 0815   PHURINE 6.0 08/29/2023 2050   GLUCOSEU NEGATIVE 08/29/2023 2050   GLUCOSEU NEGATIVE 05/05/2023 1410   HGBUR MODERATE (A) 08/29/2023 2050   BILIRUBINUR NEGATIVE 08/29/2023 2050   BILIRUBINUR Negative 02/24/2023 0951   BILIRUBINUR Negative 04/01/2013 0815   KETONESUR NEGATIVE 08/29/2023 2050   PROTEINUR 100 (A) 08/29/2023 2050   UROBILINOGEN 0.2 05/05/2023 1410   NITRITE NEGATIVE 08/29/2023 2050   LEUKOCYTESUR LARGE (A) 08/29/2023 2050   LEUKOCYTESUR Negative 04/01/2013 0815   Sepsis Labs Recent Labs   Lab 09/28/23 0553 09/30/23 0446 10/01/23 0523 10/02/23 0236  WBC 7.5 7.2 7.7 8.5   Microbiology No results found for this or any previous visit (from the past 240 hours).   Time coordinating discharge:35 minutes  SIGNED:   Anthony CHRISTELLA Pouch, MD  [facility] 10/02/2023, 11:44 AM Pager   If 7PM-7AM, please contact night-coverage [email or web address]

## 2023-10-02 NOTE — Plan of Care (Signed)

## 2023-10-04 ENCOUNTER — Other Ambulatory Visit: Payer: Self-pay | Admitting: Student

## 2023-10-04 DIAGNOSIS — G4700 Insomnia, unspecified: Secondary | ICD-10-CM

## 2023-10-04 MED ORDER — ZOLPIDEM TARTRATE 10 MG PO TABS: ORAL_TABLET | ORAL | 0 refills | Status: DC

## 2023-10-04 MED ORDER — GABAPENTIN 100 MG PO CAPS: 200.0000 mg | ORAL_CAPSULE | Freq: Two times a day (BID) | ORAL | 0 refills | Status: DC | PRN

## 2023-10-05 ENCOUNTER — Non-Acute Institutional Stay (SKILLED_NURSING_FACILITY): Admitting: Student

## 2023-10-05 ENCOUNTER — Encounter: Payer: Self-pay | Admitting: Student

## 2023-10-05 DIAGNOSIS — I5032 Chronic diastolic (congestive) heart failure: Secondary | ICD-10-CM

## 2023-10-05 DIAGNOSIS — M810 Age-related osteoporosis without current pathological fracture: Secondary | ICD-10-CM

## 2023-10-05 DIAGNOSIS — J432 Centrilobular emphysema: Secondary | ICD-10-CM

## 2023-10-05 DIAGNOSIS — I6523 Occlusion and stenosis of bilateral carotid arteries: Secondary | ICD-10-CM

## 2023-10-05 DIAGNOSIS — D649 Anemia, unspecified: Secondary | ICD-10-CM | POA: Diagnosis not present

## 2023-10-05 DIAGNOSIS — C3491 Malignant neoplasm of unspecified part of right bronchus or lung: Secondary | ICD-10-CM

## 2023-10-05 DIAGNOSIS — I442 Atrioventricular block, complete: Secondary | ICD-10-CM

## 2023-10-05 DIAGNOSIS — I480 Paroxysmal atrial fibrillation: Secondary | ICD-10-CM

## 2023-10-05 DIAGNOSIS — I959 Hypotension, unspecified: Secondary | ICD-10-CM

## 2023-10-05 DIAGNOSIS — Z7901 Long term (current) use of anticoagulants: Secondary | ICD-10-CM

## 2023-10-05 DIAGNOSIS — Z95828 Presence of other vascular implants and grafts: Secondary | ICD-10-CM

## 2023-10-05 DIAGNOSIS — Z87891 Personal history of nicotine dependence: Secondary | ICD-10-CM

## 2023-10-05 DIAGNOSIS — R569 Unspecified convulsions: Secondary | ICD-10-CM

## 2023-10-05 LAB — CBC AND DIFFERENTIAL
HCT: 27 — AB (ref 36–46)
Hemoglobin: 8.7 — AB (ref 12.0–16.0)
Neutrophils Absolute: 4493
Platelets: 413 K/uL — AB (ref 150–400)
WBC: 6.4

## 2023-10-05 LAB — COMPREHENSIVE METABOLIC PANEL WITH GFR
Calcium: 9.1 (ref 8.7–10.7)
eGFR: 91

## 2023-10-05 LAB — BASIC METABOLIC PANEL WITH GFR
CO2: 30 — AB (ref 13–22)
Chloride: 100 (ref 99–108)
Creatinine: 0.7 (ref 0.5–1.1)
Glucose: 93
Potassium: 3.8 meq/L (ref 3.5–5.1)
Sodium: 138 (ref 137–147)

## 2023-10-05 LAB — CBC: RBC: 3.12 — AB (ref 3.87–5.11)

## 2023-10-05 NOTE — Progress Notes (Signed)
 Provider:  [place] Location:  Other Nursing Home Room Number: [facility] [place] Place of Service:  SNF (31)  PCP: Tower, Laine LABOR, MD Patient Care Team: Tower, Laine LABOR, MD as PCP - General (Family Medicine) End, Lonni, MD as PCP - Cardiology (Cardiology) Fernande Elspeth BROCKS, MD as Consulting Physician (Cardiology) Carolee Manus DASEN., MD as Referring Physician (Ophthalmology) Tower, Laine LABOR, MD as Consulting Physician (Family Medicine) Dessa, Reyes ORN, MD (General Surgery)  Extended Emergency Contact Information Primary Emergency Contact: Travaglini,William T Address: [address] [place] [address] [place] Mobile Phone: [phone] Relation: Spouse  Code Status: DNR Goals of Care: Advanced Directive information    09/26/2023    3:00 PM  Advanced Directives  Does Patient Have a Medical Advance Directive? Yes  Type of Estate agent of [place];Living will  Does patient want to make changes to medical advance directive? No - Patient declined  Copy of Healthcare Power of Attorney in Chart? No - copy requested      Chief Complaint  Patient presents with   Medical Management of Chronic Issues    HPI: Patient is a 73 y.o. female seen today for admission to [facility].  Discussed the use of AI scribe software for clinical note transcription with the patient, who gave verbal consent to proceed.  History of Present Illness  History of Present Illness The patient is a 73 year old with coronary artery disease and severe orthostatic hypotension who presents with recurrent syncopal episodes.  She experiences recurrent syncopal episodes, feeling fine when sitting but losing consciousness upon standing and attempting to walk, leading to falls. These episodes have occurred multiple times over the past few months, with a significant incident on July 4th resulting in a two-week hospitalization. She was discharged on a Friday and  returned to the emergency room on Sunday, and again on July 21st, when she was admitted to the hospital. No warning symptoms such as dizziness occur before these episodes.  She has a history of severe orthostatic hypotension and is taking midodrine , Florinef , and uses an abdominal binder. She is concerned about the blood pressure medications, stating 'I can't keep living like this.'  She has a pacemaker, which was interrogated on July 18th. Previous imaging includes a CT head without contrast on July 21st and a CT cervical spine showing no fractures. A CT angiogram of the chest was done on July 4th. She has not had a recent CTA of the neck and head, although she has a history of carotid stenosis with stents.  She uses oxygen  most of the time, initially starting at night and then being advised to use it all the time. She is under the care of a pulmonologist and has a history of COPD. She uses a Stiolto inhaler every morning and has a rescue inhaler, albuterol , which she rarely uses.  She has a history of seizures, with the last seizure occurring a year after her daughter's death, which was several years ago. She was taken off seizure medication at age 29 but has been on Keppra  for several years now. She has not seen her neurologist recently.  She experiences some chest pain and shortness of breath occasionally but not consistently. She has a history of coronary artery disease and is currently taking Eliquis  and aspirin . She recalls a conversation with her cardiologist, who reassured her that Eliquis  is not causing her dizziness.  She has a history  of lumbar radiculopathy and receives epidural injections every three months to manage her back pain. She uses gabapentin  as needed when the pain becomes unmanageable between injections.  She has been using Ambien  for sleep since her daughter's death eight years ago, but she is aware of the risks associated with its use, including potential falls and memory  changes. She does not use it regularly and only as needed.  She lives in a condo community with her husband, who is two years older and in relatively good health. She has a son and had a daughter who passed away. One of her grandchildren is a Holiday representative in high school. She was a Runner, broadcasting/film/video and ran a preschool for 25 years.   Surgical History: - Permanent pacemaker insertion: For complete heart block - Carotid artery stenting: For carotid artery stenosis - Breast cancer surgery: Followed by radiation therapy  Social History - Alcohol: Previously consumed a glass of wine at night or during social gatherings, but has not consumed any since health issues began. 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    Past Medical History:  Diagnosis Date   Allergic rhinitis    Arthritis    Asthma    as a child, mild now   Breast cancer (HCC) 12/2015   right breast cancer, lumpectomy and mammosite    Colon polyps    colonoscopy 7/08, tubular adenoma   Complete heart block (HCC) 09/2011   s/p PPM implanted in [place] [facility]   COPD (chronic obstructive pulmonary disease) (HCC)    Myocardial infarction (HCC) 2011   Osteopenia 10/2015   Pacemaker    2011   Paroxysmal atrial fibrillation (HCC) 03/2021   Incidentally detected on pacemaker interrogation   Personal history of radiation therapy 2017   right breast ca, mammosite placed   Rotator cuff tear, right 02/2022   Had an MRI in [place] I have 2 tears.   Seizure disorder (HCC)    Seizures (HCC)    first one was when she was 73 years old    Small bowel obstruction (HCC)    1988 and 2002   Tobacco abuse    Past Surgical History:  Procedure Laterality Date   ABDOMINAL HYSTERECTOMY     BREAST BIOPSY Right 2007   benign inflammatory changes, mass due to underwire bra   BREAST BIOPSY Left 01/02/2016   columnar cell changes without atypical hyperplasia.    BREAST BIOPSY Right 12/06/2015   rt breast mass 10:00, bx done at Dr. Gilberto office, invasive ductal carcinoma   BREAST EXCISIONAL BIOPSY Left 01/02/2016   COLUMNAR CELL CHANGE AND HYPERPLASIA ASSOCIATED WITH LUMINAL AND STROMAL CALCIFICATIONS   BREAST LUMPECTOMY Right 01/02/2016   invasive mammary carcinoma, clear margins, negative LN   BREAST LUMPECTOMY WITH SENTINEL LYMPH NODE BIOPSY Right 01/02/2016   pT1c, N0; ER/ PR 100%; Her 2 neu not over expressed: BREAST LUMPECTOMY WITH SENTINEL LYMPH NODE BX;  Surgeon: Reyes LELON Cota, MD;  Location: [facility] ORS;  Service: General;  Laterality: Right;   CARDIAC CATHETERIZATION     CAROTID PTA/STENT INTERVENTION Right 04/29/2022   Procedure: CAROTID PTA/STENT INTERVENTION;  Surgeon: Jama Cordella MATSU, MD;  Location: [facility] INVASIVE CV LAB;  Service: Cardiovascular;  Laterality: Right;   CATARACT EXTRACTION Bilateral    COLONOSCOPY  10/2015   Dr Teressa   COLONOSCOPY WITH PROPOFOL  N/A 08/19/2018   Procedure: COLONOSCOPY WITH PROPOFOL ;  Surgeon: Janalyn Keene NOVAK,  MD;  Location: [facility] ENDOSCOPY;  Service: Endoscopy;  Laterality: N/A;   ESOPHAGOGASTRODUODENOSCOPY (EGD) WITH PROPOFOL  N/A 08/19/2018   Procedure: ESOPHAGOGASTRODUODENOSCOPY (EGD) WITH PROPOFOL ;  Surgeon: Janalyn Keene NOVAK, MD;  Location: [facility] ENDOSCOPY;  Service: Endoscopy;  Laterality: N/A;   EXPLORATORY LAPAROTOMY  01/25/2001   Exploratory laparotomy, lysis of adhesions, identification of internal hernia secondary to omental adhesion. Prolonged postoperative ileus.   gyn surgery  1993   hysterectomy- form endometriosis   LAPAROSCOPY     PACEMAKER INSERTION  10/24/11   [employer] Advantio dual chamber PPM implanted by Dr Ronny at [facility] in [place]   PPM GENERATOR CHANGEOUT N/A 12/05/2019   Procedure: PPM GENERATOR HERMA;  Surgeon: Fernande Elspeth BROCKS, MD;  Location: [facility] INVASIVE CV LAB;  Service: Cardiovascular;  Laterality: N/A;   RIGHT/LEFT HEART  CATH AND CORONARY ANGIOGRAPHY Bilateral 07/19/2019   Procedure: RIGHT/LEFT HEART CATH AND CORONARY ANGIOGRAPHY;  Surgeon: Mady Bruckner, MD;  Location: [facility] INVASIVE CV LAB;  Service: Cardiovascular;  Laterality: Bilateral;   TEMPORARY PACEMAKER N/A 12/05/2019   Procedure: TEMPORARY PACEMAKER;  Surgeon: Fernande Elspeth BROCKS, MD;  Location: [facility] INVASIVE CV LAB;  Service: Cardiovascular;  Laterality: N/A;    reports that she quit smoking about 12 years ago. Her smoking use included cigarettes. She started smoking about 54 years ago. She has a 63 pack-year smoking history. She has never used smokeless tobacco. She reports current alcohol use of about 1.0 - 2.0 standard drink of alcohol per week. She reports that she does not use drugs. Social History   Socioeconomic History   Marital status: Married    Spouse name: Tom   Number of children: Not on file   Years of education: Not on file   Highest education level: Some college, no degree  Occupational History    Employer: RETIRED  Tobacco Use   Smoking status: Former    Current packs/day: 0.00    Average packs/day: 1.5 packs/day for 42.0 years (63.0 ttl pk-yrs)    Types: Cigarettes    Start date: 11/10/1968    Quit date: 11/11/2010    Years since quitting: 12.9   Smokeless tobacco: Never  Vaping Use   Vaping status: Never Used  Substance and Sexual Activity   Alcohol use: Yes    Alcohol/week: 1.0 - 2.0 standard drink of alcohol    Types: 1 - 2 Glasses of wine per week    Comment: weekly   Drug use: No   Sexual activity: Never  Other Topics Concern   Not on file  Social History Narrative   Married 40 years.   Exercises, does walking tapes   Social Drivers of Health   Financial Resource Strain: Low Risk  (05/13/2023)   Overall Financial Resource Strain (CARDIA)    Difficulty of Paying Living Expenses: Not hard at all  Food Insecurity: No Food Insecurity (09/26/2023)   Hunger Vital Sign    Worried About Running Out of Food in the Last  Year: Never true    Ran Out of Food in the Last Year: Never true  Transportation Needs: No Transportation Needs (09/26/2023)   PRAPARE - Administrator, Civil Service (Medical): No    Lack of Transportation (Non-Medical): No  Physical Activity: Inactive (05/13/2023)   Exercise Vital Sign    Days of Exercise per Week: 0 days    Minutes of Exercise per Session: 0 min  Stress: No Stress Concern Present (05/13/2023)   [employer] of Occupational Health -  Occupational Stress Questionnaire    Feeling of Stress : Not at all  Social Connections: Socially Integrated (09/26/2023)   Social Connection and Isolation Panel    Frequency of Communication with Friends and Family: Three times a week    Frequency of Social Gatherings with Friends and Family: Once a week    Attends Religious Services: More than 4 times per year    Active Member of Clubs or Organizations: Yes    Attends Banker Meetings: More than 4 times per year    Marital Status: Married  Catering manager Violence: Not At Risk (09/26/2023)   Humiliation, Afraid, Rape, and Kick questionnaire    Fear of Current or Ex-Partner: No    Emotionally Abused: No    Physically Abused: No    Sexually Abused: No    Functional Status Survey:    Family History  Problem Relation Age of Onset   Stroke Mother    Heart disease Mother 18       MI and CABG   Dementia Mother    Coronary artery disease Father    Parkinsonism Father    Breast cancer Sister    Cancer - Cervical Daughter 38       died 03-29-2023   Heart attack Brother 59   Colon cancer Neg Hx     Health Maintenance  Topic Date Due   Zoster Vaccines- Shingrix (1 of 2) 07/27/1967   INFLUENZA VACCINE  09/25/2023   DTaP/Tdap/Td (3 - Tdap) 03/05/2024 (Originally 06/13/2017)   Colonoscopy  03/24/2024 (Originally 08/18/2021)   COVID-19 Vaccine (5 - 2024-25 season) 05/23/2024 (Originally 10/26/2022)   MAMMOGRAM  01/06/2024   Medicare Annual Wellness (AWV)  03/05/2024    Pneumococcal Vaccine: 50+ Years  Completed   DEXA SCAN  Completed   Hepatitis C Screening  Completed   Hepatitis B Vaccines  Aged Out   HPV VACCINES  Aged Out   Meningococcal B Vaccine  Aged Out   Lung Cancer Screening  Discontinued    Allergies  Allergen Reactions   Codeine Nausea And Vomiting   Morphine  And Codeine Nausea Only    Outpatient Encounter Medications as of 10/05/2023  Medication Sig   acetaminophen  (TYLENOL ) 500 MG tablet Take 500-1,000 mg by mouth every 6 (six) hours as needed for mild pain or headache.    albuterol  (VENTOLIN  HFA) 108 (90 Base) MCG/ACT inhaler INHALE 2 PUFFS BY MOUTH EVERY 4 HOURS AS NEEDED FOR WHEEZING OR SHORTNESS OF BREATH   apixaban  (ELIQUIS ) 5 MG TABS tablet Take 1 tablet (5 mg total) by mouth 2 (two) times daily.   aspirin  EC 81 MG tablet Take 1 tablet (81 mg total) by mouth daily.   atorvastatin  (LIPITOR) 40 MG tablet Take 1 tablet by mouth once daily   benzonatate  (TESSALON ) 200 MG capsule Take 1 capsule (200 mg total) by mouth 2 (two) times daily as needed for cough.   Cholecalciferol  (VITAMIN D3) 50 MCG (2000 UT) capsule Take 2,000 Units by mouth daily.   fludrocortisone  (FLORINEF ) 0.1 MG tablet Take 2 tablets (0.2 mg total) by mouth daily.   FLUoxetine  (PROZAC ) 20 MG capsule Take 1 capsule by mouth once daily   gabapentin  (NEURONTIN ) 100 MG capsule Take 2 capsules (200 mg total) by mouth 2 (two) times daily as needed.   levETIRAcetam  (KEPPRA ) 250 MG tablet Take 250 mg by mouth 2 (two) times daily.    midodrine  (PROAMATINE ) 10 MG tablet Take 1 tablet (10 mg total) by mouth 3 (three) times  daily with meals.   OXYGEN  Inhale 4 L into the lungs continuous.   pantoprazole  (PROTONIX ) 40 MG tablet Take 1 tablet (40 mg total) by mouth daily.   potassium chloride  (KLOR-CON  M) 10 MEQ tablet Take 10 mEq by mouth daily.   pyridostigmine  (MESTINON ) 60 MG tablet Take 1 tablet (60 mg total) by mouth 3 (three) times daily.   Tiotropium Bromide-Olodaterol  (STIOLTO RESPIMAT ) 2.5-2.5 MCG/ACT AERS INHALE 2 PUFFS BY MOUTH ONCE DAILY   zolpidem  (AMBIEN ) 10 MG tablet TAKE 1/2 TO 1 (ONE-HALF TO ONE) TABLET BY MOUTH AT BEDTIME AS NEEDED FOR SLEEP   No facility-administered encounter medications on file as of 10/05/2023.    Review of Systems  Vitals:   10/05/23 1053  BP: (!) 151/76  Pulse: 70  Resp: 18  Temp: (!) 97.3 F (36.3 C)  SpO2: 96%  Weight: 190 lb (86.2 kg)  PF: (!) 2 L/min   Body mass index is 28.89 kg/m. Physical Exam Constitutional:      Appearance: Normal appearance.     Comments: Wearing 2LNC  Cardiovascular:     Rate and Rhythm: Normal rate and regular rhythm.     Pulses: Normal pulses.     Heart sounds: Normal heart sounds.  Pulmonary:     Effort: Pulmonary effort is normal.  Abdominal:     General: Abdomen is flat. Bowel sounds are normal.     Palpations: Abdomen is soft.  Musculoskeletal:        General: No swelling or tenderness.  Skin:    General: Skin is warm and dry.     Capillary Refill: Capillary refill takes 2 to 3 seconds.  Neurological:     Mental Status: She is alert and oriented to person, place, and time.     Gait: Gait normal.  Psychiatric:        Mood and Affect: Mood normal.    Physical Exam   Labs reviewed: Basic Metabolic Panel: Recent Labs    09/02/23 0507 09/03/23 0458 09/04/23 0536 09/05/23 0625 09/26/23 0520 09/28/23 0553 09/30/23 0446 10/01/23 0523 10/02/23 0236  NA 134* 134* 133*   < > 138   < > 136 137 137  K 4.1 4.2 4.3   < > 2.9*   < > 3.3* 3.2* 3.5  CL 104 101 101   < > 102   < > 99 102 100  CO2 22 21* 22   < > 27   < > 28 25 28   GLUCOSE 105* 108* 110*   < > 119*   < > 107* 110* 110*  BUN 14 19 14    < > 13   < > 20 14 13   CREATININE 0.82 0.74 0.87   < > 0.64   < > 0.83 0.67 0.75  CALCIUM  9.0 9.0 8.9   < > 8.6*   < > 8.9 8.9 9.0  MG 2.0 2.0 2.0  --  1.7  --   --   --   --   PHOS 3.0 3.5 3.3  --   --   --   --   --   --    < > = values in this interval not  displayed.   Liver Function Tests: Recent Labs    08/29/23 0428 09/14/23 1129 09/25/23 2305  AST 17 21 17   ALT 11 13 12   ALKPHOS 68 77 83  BILITOT 1.0 0.9 0.9  PROT 6.9 7.1 7.8  ALBUMIN 3.3* 3.5 3.9   No results  for input(s): LIPASE, AMYLASE in the last 8760 hours. No results for input(s): AMMONIA in the last 8760 hours. CBC: Recent Labs    02/24/23 1016 03/18/23 1015 08/26/23 1340 09/14/23 1128 09/25/23 2305 09/30/23 0446 10/01/23 0523 10/02/23 0236  WBC 7.7 7.8   < > 8.1   < > 7.2 7.7 8.5  NEUTROABS 5.8 5.7  --  6.0  --   --   --   --   HGB 12.9 12.8   < > 11.2*   < > 9.1* 10.2* 9.7*  HCT 39.3 39.2   < > 34.2*   < > 27.8* 31.7* 30.2*  MCV 91.2 90.2   < > 90.5   < > 87.4 86.8 88.3  PLT 292.0 284.0   < > 426*   < > 372 386 381   < > = values in this interval not displayed.   Cardiac Enzymes: No results for input(s): CKTOTAL, CKMB, CKMBINDEX, TROPONINI in the last 8760 hours. BNP: Invalid input(s): POCBNP Lab Results  Component Value Date   HGBA1C 6.2 03/18/2023   Lab Results  Component Value Date   TSH 0.783 08/29/2023   Lab Results  Component Value Date   VITAMINB12 461 03/18/2023   Lab Results  Component Value Date   FOLATE 13.6 08/31/2023   Lab Results  Component Value Date   IRON 79 09/18/2022   FERRITIN 113.2 11/04/2021   Results    Imaging and Procedures obtained prior to SNF admission: DG Ankle Complete Right Result Date: 09/26/2023 CLINICAL DATA:  Pain.  Fall. EXAM: RIGHT ANKLE - COMPLETE 3+ VIEW COMPARISON:  None Available. FINDINGS: Punctate linear foci of mineralization adjacent to the tip of the medial malleolus are suspicious for ligamentous injury. The remainder of the visualized bones appear intact. No malalignment. Ankle mortise is congruent. Joint spaces are relatively maintained. Soft tissue swelling along the medial ankle. IMPRESSION: Punctate linear foci of mineralization adjacent to the tip of the medial malleolus  are suspicious for ligamentous injury. Overlying soft tissue swelling. Electronically Signed   By: Harrietta Sherry M.D.   On: 09/26/2023 16:22  Results RADIOLOGY CT Head without contrast: Negative for stroke (09/14/2023) CT Cervical Spine: No fractures (09/14/2023) CT Chest: No abnormalities (08/28/2023)  DIAGNOSTIC Echocardiogram: Left ventricular ejection fraction 56%, normal left ventricular function, no regional wall motion abnormalities, normal left ventricular internal cavity size, no hypertrophy, grade 1 diastolic dysfunction, normal right heart, normal left and right atrial size, normal valves (07/2023)  Assessment/Plan =Orthostatic hypotension Severe orthostatic hypotension with recurrent syncopal episodes and recent hospitalizations due to falls and syncope. Current management includes midodrine , Florinef , and abdominal binder. Concerns about zolpidem  (Ambien ) contributing to falls and memory changes. - Discontinue zolpidem  (Ambien ) due to risk of falls and memory changes. - Encourage wearing abdominal binder and compression stockings. - Advise increasing fluid intake to six to eight glasses of water per day.  Coronary artery disease Coronary artery disease managed with aspirin  and Eliquis . Recent echocardiogram shows normal left ventricular function with an ejection fraction of 55-60%. No significant chest pain or shortness of breath reported.  Carotid artery stenosis, status post stenting Carotid artery stenosis with stenting. Previous imaging showed stenosis, but recent follow-up with cardiologist indicated good status post-stenting.  Chronic heart block, status post permanent pacemaker Chronic heart block managed with a permanent pacemaker. Recent pacemaker interrogation showed no issues. Pacemaker dependence noted.  Seizure disorder Seizure disorder with last seizure occurring a year after daughter's death. Currently on Keppra  with no recent  seizures reported. Discussion  about potential need for neurologist consultation to reassess seizure management and medication efficacy. - Refer to neurologist for reassessment of seizure management.  Chronic obstructive pulmonary disease (COPD) COPD managed with Stiolto inhaler and albuterol  as a rescue inhaler. Oxygen  use most of the time.  Osteoarthritis Osteoarthritis with intermittent use of gabapentin  for pain management. Epidural injections every three months for lumbar radiculopathy-related pain.  Lumbar radiculopathy Lumbar radiculopathy managed with epidural injections every three months. Gabapentin  used as needed for breakthrough pain.  History of breast cancer, status post surgery and radiation Breast cancer treated with surgery and radiation.  Heart failure with preserved ejection fraction (HFpEF) Heart failure with preserved ejection fraction. Echocardiogram shows normal left ventricular function with an ejection fraction of 55-60%. Grade 1 diastolic dysfunction noted, indicating difficulty in heart relaxation but good contraction.   Goals of Care She states she would be open to returning to the hospital if she has from for improvement. She acknowledges that between COPD and having a pacemaker she is concerned that she would have more issues than most. She states she does not want to be resuscitated. DNR order placed.  Family/ staff Communication: Nursing  Labs/tests ordered: CBC, BMP

## 2023-10-06 ENCOUNTER — Encounter: Payer: Self-pay | Admitting: Student

## 2023-10-07 NOTE — Telephone Encounter (Signed)
 Pt is requesting a callback regarding medication changes. Please advise

## 2023-10-08 ENCOUNTER — Telehealth: Payer: Self-pay | Admitting: Medical

## 2023-10-08 NOTE — Telephone Encounter (Signed)
 Patient has concerns regarding BP meds. She is on Pyridostigmine  & hospital gave her to take. Her BP is elevated since 8/2 did not have readings. Wants to know what to take please advise call husband, Charlena [phone]

## 2023-10-08 NOTE — Progress Notes (Signed)
 Complex Care Management Note  Care Guide Note 10/08/2023 Name: Diana Cook MRN: [id number] DOB: 06/29/1950  Diana Cook is a 73 y.o. year old female who sees Tower, Laine LABOR, MD for primary care. I reached out to Donae C Woolverton by phone today to offer complex care management services.  Ms. Mcduffee was given information about Complex Care Management services today including:   The Complex Care Management services include support from the care team which includes your Nurse Care Manager, Clinical Social Worker, or Pharmacist.  The Complex Care Management team is here to help remove barriers to the health concerns and goals most important to you. Complex Care Management services are voluntary, and the patient may decline or stop services at any time by request to their care team member.   Complex Care Management Consent Status: Patient did not agree to participate in complex care management services at this time.  Encounter Outcome:  Patient in SNF.  Dreama [facility]  [facility], [facility] Care Management Assistant Direct Dial: [phone]  Fax: [phone]

## 2023-10-08 NOTE — Telephone Encounter (Signed)
 Called and spoke with the patient's husband Diana Cook.  He was concerned about the patient being put on Fludrocortisone , Midodrine , and Pyridostigmine .  He did not know if the patient needed to be on all of those medications or not since the patient was recently hospitalized due to a syncopal episode resulting from orthostatic hypotension.  Diana Cook was informed that Pyridostigmine  is used to treat certain conditions including myasthenia gravis.  And the other two medications are used to treat orthostatic hypotension, so she should stay on those medications until further evaluation by a provider indicates that she no longer needs to be on those medications.  Diana Cook verbalized understanding and agreement with the treatment plan.  Diana Cook was informed of the upcoming appointments with [facility] on 10/27/23 and Furth on 11/05/23 where he would further opportunity to discuss his concerns.  Diana Cook verbalized gratitude for the return call.  All questions and concerns addressed at this time.

## 2023-10-12 ENCOUNTER — Encounter: Payer: Self-pay | Admitting: Student

## 2023-10-12 ENCOUNTER — Non-Acute Institutional Stay (SKILLED_NURSING_FACILITY): Admitting: Student

## 2023-10-12 DIAGNOSIS — I951 Orthostatic hypotension: Secondary | ICD-10-CM

## 2023-10-12 DIAGNOSIS — M25561 Pain in right knee: Secondary | ICD-10-CM

## 2023-10-12 DIAGNOSIS — Z9181 History of falling: Secondary | ICD-10-CM | POA: Diagnosis not present

## 2023-10-12 LAB — BASIC METABOLIC PANEL WITH GFR
BUN: 14 (ref 4–21)
CO2: 25 — AB (ref 13–22)
Chloride: 104 (ref 99–108)
Creatinine: 0.8 (ref 0.5–1.1)
Glucose: 87
Potassium: 3.7 meq/L (ref 3.5–5.1)
Sodium: 138 (ref 137–147)

## 2023-10-12 LAB — CBC AND DIFFERENTIAL
HCT: 28 — AB (ref 36–46)
Hemoglobin: 8.8 — AB (ref 12.0–16.0)
Neutrophils Absolute: 4706
Platelets: 484 K/uL — AB (ref 150–400)
WBC: 6.6

## 2023-10-12 LAB — COMPREHENSIVE METABOLIC PANEL WITH GFR
Calcium: 9 (ref 8.7–10.7)
eGFR: 79

## 2023-10-12 LAB — CBC: RBC: 3.16 — AB (ref 3.87–5.11)

## 2023-10-12 NOTE — Progress Notes (Signed)
 Location:  [facility] Room Number: [facility] 108-A Place of Service:  SNF [phone]) Provider:  Abdul Fine, Cook   Tower, Diana LABOR, Cook  Patient Care Team: Tower, Diana LABOR, Cook as PCP - General (Family Medicine) End, Diana Cook as PCP - Cardiology (Cardiology) Diana Elspeth BROCKS, Cook as Consulting Physician (Cardiology) Diana Cook., Cook as Referring Physician (Ophthalmology) Tower, Diana LABOR, Cook as Consulting Physician (Family Medicine) Diana Reyes ORN, Cook (General Surgery)  Extended Emergency Contact Information Primary Emergency Contact: Diana,William Cook Address: [address]          [place], [place] [address] [place] Mobile Phone: [phone] Relation: Spouse  Code Status:   Goals of care: Advanced Directive information    09/26/2023    3:00 PM  Advanced Directives  Does Patient Have a Medical Advance Directive? Yes  Type of Estate agent of [place];Living will  Does patient want to make changes to medical advance directive? No - Patient declined  Copy of Healthcare Power of Attorney in Chart? No - copy requested     Chief Complaint  Patient presents with   Acute Visit    Knee pain    HPI:  Pt is a 73 y.o. female seen today for an acute visit for  History of Present Illness The patient, with orthostatic hypotension, presents with knee pain and instability.  She has been experiencing knee pain and instability for several months, exacerbated by a recent fall where her leg got caught under her bed, leading to significant bruising and an ankle injury. Her knee feels like 'a sponge with water' and tends to give way while walking.  She has been using a knee brace, which provides some relief, but the knee continues to feel unstable. There is a history of fluid accumulation in the knee, which has been previously drained, and she has received a cortisone injection that provided relief. There is a sensation of 'swish,  swish, swish' in the knee, and she experiences crepitus with movement.  Her past medical history includes orthostatic hypotension, for which she has been admitted previously due to numerous falls.   Past Medical History:  Diagnosis Date   Allergic rhinitis    Arthritis    Asthma    as a child, mild now   Breast cancer (HCC) 12/2015   right breast cancer, lumpectomy and mammosite    Colon polyps    colonoscopy 7/08, tubular adenoma   Complete heart block (HCC) 09/2011   s/p PPM implanted in [place] Cook   COPD (chronic obstructive pulmonary disease) (HCC)    Myocardial infarction (HCC) 2011   Osteopenia 10/2015   Pacemaker    2011   Paroxysmal atrial fibrillation (HCC) 03/2021   Incidentally detected on pacemaker interrogation   Personal history of radiation therapy 2017   right breast ca, mammosite placed   Rotator cuff tear, right 02/2022   Had an MRI in [place] I have 2 tears.   Seizure disorder (HCC)    Seizures (HCC)    first one was when she was 73 years old    Small bowel obstruction (HCC)    1988 and 2002   Tobacco abuse    Past Surgical History:  Procedure Laterality Date   ABDOMINAL HYSTERECTOMY     BREAST BIOPSY Right 2007   benign inflammatory changes, mass due to underwire bra   BREAST BIOPSY Left 01/02/2016   columnar cell changes without atypical hyperplasia.   BREAST  BIOPSY Right 12/06/2015   rt breast mass 10:00, bx done at [facility]. [facility] office, invasive ductal carcinoma   BREAST EXCISIONAL BIOPSY Left 01/02/2016   COLUMNAR CELL CHANGE AND HYPERPLASIA ASSOCIATED WITH LUMINAL AND STROMAL CALCIFICATIONS   BREAST LUMPECTOMY Right 01/02/2016   invasive mammary carcinoma, clear margins, negative LN   BREAST LUMPECTOMY WITH SENTINEL LYMPH NODE BIOPSY Right 01/02/2016   pT1c, N0; ER/ PR 100%; Her 2 neu not over expressed: BREAST LUMPECTOMY WITH SENTINEL LYMPH NODE BX;  Surgeon: Reyes LELON Cota, Cook;  Location: [facility] ORS;  Service: General;  Laterality: Right;    CARDIAC CATHETERIZATION     CAROTID PTA/STENT INTERVENTION Right 04/29/2022   Procedure: CAROTID PTA/STENT INTERVENTION;  Surgeon: Jama Cordella MATSU, Cook;  Location: [facility] INVASIVE CV LAB;  Service: Cardiovascular;  Laterality: Right;   CATARACT EXTRACTION Bilateral    COLONOSCOPY  10/2015   [facility] Teressa   COLONOSCOPY WITH PROPOFOL  N/A 08/19/2018   Procedure: COLONOSCOPY WITH PROPOFOL ;  Surgeon: Diana Keene NOVAK, Cook;  Location: [facility] ENDOSCOPY;  Service: Endoscopy;  Laterality: N/A;   ESOPHAGOGASTRODUODENOSCOPY (EGD) WITH PROPOFOL  N/A 08/19/2018   Procedure: ESOPHAGOGASTRODUODENOSCOPY (EGD) WITH PROPOFOL ;  Surgeon: Diana Keene NOVAK, Cook;  Location: [facility] ENDOSCOPY;  Service: Endoscopy;  Laterality: N/A;   EXPLORATORY LAPAROTOMY  01/25/2001   Exploratory laparotomy, lysis of adhesions, identification of internal hernia secondary to omental adhesion. Prolonged postoperative ileus.   gyn surgery  1993   hysterectomy- form endometriosis   LAPAROSCOPY     PACEMAKER INSERTION  10/24/11   [employer] Advantio dual chamber PPM implanted by Diana Cook at [facility] in [place]   PPM GENERATOR CHANGEOUT N/A 12/05/2019   Procedure: PPM GENERATOR HERMA;  Surgeon: Diana Elspeth BROCKS, Cook;  Location: [facility] INVASIVE CV LAB;  Service: Cardiovascular;  Laterality: N/A;   RIGHT/LEFT HEART CATH AND CORONARY ANGIOGRAPHY Bilateral 07/19/2019   Procedure: RIGHT/LEFT HEART CATH AND CORONARY ANGIOGRAPHY;  Surgeon: Diana Bruckner, Cook;  Location: [facility] INVASIVE CV LAB;  Service: Cardiovascular;  Laterality: Bilateral;   TEMPORARY PACEMAKER N/A 12/05/2019   Procedure: TEMPORARY PACEMAKER;  Surgeon: Diana Elspeth BROCKS, Cook;  Location: [facility] INV[facility] CV [facility];  Service: Cardiovascular;  Laterality: N/A;    Allergies  Allergen Reactions   Codeine Nausea And Vomiting   Morphine  And Codeine Nausea Only    Allergies as of 10/12/2023       Reactions   Codeine Nausea And Vomiting   Morphine  And Codeine  Nausea Only        Medication List        Accurate as of October 12, 2023 12:01 PM. If you have any questions, ask your nurse or doctor.          acetaminophen  500 MG tablet Commonly known as: TYLENOL  Take 500-1,000 mg by mouth every 6 (six) hours as needed for mild pain or headache.   albuterol  108 (90 Base) MCG/ACT inhaler Commonly known as: VENTOLIN  HFA INHALE 2 PUFFS BY MOUTH EVERY 4 HOURS AS NEEDED FOR WHEEZING OR SHORTNESS OF BREATH   apixaban  5 MG Tabs tablet Commonly known as: Eliquis  Take 1 tablet (5 mg total) by mouth 2 (two) times daily.   aspirin  EC 81 MG tablet Take 1 tablet (81 mg total) by mouth daily.   atorvastatin  40 MG tablet Commonly known as: LIPITOR Take 1 tablet by mouth once daily   benzonatate  200 MG capsule Commonly known as: TESSALON  Take 1 capsule (200 mg total) by mouth 2 (two) times daily as needed for  cough.   fludrocortisone  0.1 MG tablet Commonly known as: FLORINEF  Take 2 tablets (0.2 mg total) by mouth daily.   FLUoxetine  20 MG capsule Commonly known as: PROZAC  Take 1 capsule by mouth once daily   gabapentin  100 MG capsule Commonly known as: NEURONTIN  Take 2 capsules (200 mg total) by mouth 2 (two) times daily as needed.   levETIRAcetam  250 MG tablet Commonly known as: KEPPRA  Take 250 mg by mouth 2 (two) times daily.   midodrine  10 MG tablet Commonly known as: PROAMATINE  Take 1 tablet (10 mg total) by mouth 3 (three) times daily with meals.   OXYGEN  Inhale 4 L into the lungs continuous.   pantoprazole  40 MG tablet Commonly known as: Protonix  Take 1 tablet (40 mg total) by mouth daily.   potassium chloride  10 MEQ tablet Commonly known as: KLOR-CON  M Take 10 mEq by mouth daily.   pyridostigmine  60 MG tablet Commonly known as: Mestinon  Take 1 tablet (60 mg total) by mouth 3 (three) times daily.   Stiolto Respimat  2.5-2.5 MCG/ACT Aers Generic drug: Tiotropium Bromide-Olodaterol INHALE 2 PUFFS BY MOUTH ONCE DAILY    Vitamin D3 50 MCG (2000 UT) capsule Take 2,000 Units by mouth daily.        Review of Systems  Immunization History  Administered Date(s) Administered    sv, Bivalent, Protein Subunit Rsvpref,pf (Abrysvo) 12/31/2022   Fluad Quad(high Dose 65+) 10/24/2020   Influenza Whole 02/24/2005   Influenza, High Dose Seasonal PF 11/04/2018, 11/22/2019, 12/31/2022   Influenza,inj,Quad PF,6+ Mos 12/03/2012, 12/09/2013, 01/16/2015, 02/06/2016, 11/04/2016, 12/08/2017   Influenza-Unspecified 10/25/2021   PFIZER Comirnaty(Gray Top)Covid-19 Tri-Sucrose Vaccine 07/06/2020   PFIZER(Purple Top)SARS-COV-2 Vaccination 02/28/2019, 03/21/2019, 11/14/2019   PNEUMOCOCCAL CONJUGATE-20 07/06/2020   Pneumococcal Conjugate-13 07/11/2014   Pneumococcal Polysaccharide-23 03/21/2005, 07/26/2015   Td 01/09/1999, 06/14/2007   Zoster, Live 09/07/2015   Pertinent  Health Maintenance Due  Topic Date Due   INFLUENZA VACCINE  09/25/2023   Colonoscopy  03/24/2024 (Originally 08/18/2021)   MAMMOGRAM  01/06/2024   DEXA SCAN  Completed      09/18/2022    9:32 AM 10/08/2022   11:38 AM 03/06/2023    1:12 PM 03/25/2023    2:04 PM 05/08/2023   11:10 AM  Fall Risk  Falls in the past year? 1 1 0 0 0  Was there an injury with Fall? 1 1 0 0 0  Fall Risk Category Calculator 2 2 0 0 0  Patient at Risk for Falls Due to History of fall(s)  No Fall Risks No Fall Risks No Fall Risks  Fall risk Follow up Falls evaluation completed Falls evaluation completed Education provided;Falls prevention discussed Falls evaluation completed Falls evaluation completed   Functional Status Survey:    Vitals:   10/12/23 1132  BP: 119/72  Pulse: 86  Resp: 18  Temp: (!) 97.4 F (36.3 C)  SpO2: 93%  Weight: 201 lb 3.2 oz (91.3 kg)  Height: 5' 8 (1.727 m)   Body mass index is 30.59 kg/m. Physical Exam Physical Exam MUSCULOSKELETAL: Soft tissue swelling in the knee, warm to touch on the right knee, tenderness on the medial joint line of  the knee, crepitus on knee extension, and tenderness in the medial compartment with knee rotation. Labs reviewed: Recent Labs    09/02/23 0507 09/03/23 9541 09/04/23 0536 09/05/23 9374 09/26/23 0520 09/28/23 0553 09/30/23 0446 10/01/23 0523 10/02/23 0236  NA 134* 134* 133*   < > 138   < > 136 137 137  K 4.1 4.2  4.3   < > 2.9*   < > 3.3* 3.2* 3.5  CL 104 101 101   < > 102   < > 99 102 100  CO2 22 21* 22   < > 27   < > 28 25 28   GLUCOSE 105* 108* 110*   < > 119*   < > 107* 110* 110*  BUN 14 19 14    < > 13   < > 20 14 13   CREATININE 0.82 0.74 0.87   < > 0.64   < > 0.83 0.67 0.75  CALCIUM  9.0 9.0 8.9   < > 8.6*   < > 8.9 8.9 9.0  MG 2.0 2.0 2.0  --  1.7  --   --   --   --   PHOS 3.0 3.5 3.3  --   --   --   --   --   --    < > = values in this interval not displayed.   Recent Labs    08/29/23 0428 09/14/23 1129 09/25/23 2305  AST 17 21 17   ALT 11 13 12   ALKPHOS 68 77 83  BILITOT 1.0 0.9 0.9  PROT 6.9 7.1 7.8  ALBUMIN 3.3* 3.5 3.9   Recent Labs    02/24/23 1016 03/18/23 1015 08/26/23 1340 09/14/23 1128 09/25/23 2305 09/30/23 0446 10/01/23 0523 10/02/23 0236  WBC 7.7 7.8   < > 8.1   < > 7.2 7.7 8.5  NEUTROABS 5.8 5.7  --  6.0  --   --   --   --   HGB 12.9 12.8   < > 11.2*   < > 9.1* 10.2* 9.7*  HCT 39.3 39.2   < > 34.2*   < > 27.8* 31.7* 30.2*  MCV 91.2 90.2   < > 90.5   < > 87.4 86.8 88.3  PLT 292.0 284.0   < > 426*   < > 372 386 381   < > = values in this interval not displayed.   Lab Results  Component Value Date   TSH 0.783 08/29/2023   Lab Results  Component Value Date   HGBA1C 6.2 03/18/2023   Lab Results  Component Value Date   CHOL 156 03/18/2023   HDL 64.10 03/18/2023   LDLCALC 72 03/18/2023   TRIG 98.0 03/18/2023   CHOLHDL 2 03/18/2023    Significant Diagnostic Results in last 30 days:  EEG adult Result Date: 09/28/2023 Gregg Lek, Cook     09/28/2023  4:37 PM Patient Name: Diana Cook MRN: [id number] Epilepsy Attending: Lek Gregg  Referring Physician/Provider: No ref. provider found     Date: 09/28/2023 Duration: 30 minutes Patient history: Patient with severe orthostatic hypotension, presenting with syncope. EEG to evaluate for seizure Level of alertness: Awake, drowsy AEDs during EEG study: LEV Technical aspects: This EEG study was done with scalp electrodes positioned according to the 10-20 International system of electrode placement. Electrical activity was reviewed with band pass filter of 1-70Hz , sensitivity of 7 uV/mm, display speed of 2mm/sec with a 60Hz  notched filter applied as appropriate. EEG data were recorded continuously and digitally stored.  Video monitoring was available and reviewed as appropriate. Description: The posterior dominant rhythm consists of 9-10 Hz activity of moderate voltage (25-35 uV) seen predominantly in posterior head regions, symmetric and reactive to eye opening and eye closing. Drowsiness was characterized by attenuation of the posterior background rhythm. Sleep was not seen. Hyperventilation and photic stimulation were not performed.  ABNORMALITY -None IMPRESSION: This study is within normal limits. No seizures or epileptiform discharges were seen throughout the recording. A normal interictal EEG does not exclude nor support the diagnosis of epilepsy. Pastor Falling [facility]     DG Ankle Complete Right Result Date: 09/26/2023 CLINICAL DATA:  Pain.  Fall. EXAM: RIGHT ANKLE - COMPLETE 3+ VIEW COMPARISON:  None Available. FINDINGS: Punctate linear foci of mineralization adjacent to the tip of the medial malleolus are suspicious for ligamentous injury. The remainder of the visualized bones appear intact. No malalignment. Ankle mortise is congruent. Joint spaces are relatively maintained. Soft tissue swelling along the medial ankle. IMPRESSION: Punctate linear foci of mineralization adjacent to the tip of the medial malleolus are suspicious for ligamentous injury. Overlying soft tissue swelling.  Electronically Signed   By: Harrietta Sherry M.D.   On: 09/26/2023 16:22   CT Cervical Spine Wo Contrast Result Date: 09/14/2023 CLINICAL DATA:  Trauma, cervical spine trauma, multiple syncopal episodes. EXAM: CT CERVICAL SPINE WITHOUT CONTRAST TECHNIQUE: Multidetector CT imaging of the cervical spine was performed without intravenous contrast. Multiplanar CT image reconstructions were also generated. RADIATION DOSE REDUCTION: This exam was performed according to the departmental dose-optimization program which includes automated exposure control, adjustment of the mA and/or kV according to patient size and/or use of iterative reconstruction technique. COMPARISON:  CT cervical spine 08/28/2023. FINDINGS: Alignment: Straightening of the normal cervical lordosis. Similar anterolisthesis of C4 on C5 likely related to degenerative changes. No facet subluxation or dislocation. Skull base and vertebrae: No compression fracture or displaced fracture in the cervical spine. No suspicious osseous lesion. Degenerative endplate changes most pronounced at C5-6 and C6-7. Degenerative changes noted in the temporomandibular joints bilaterally. Soft tissues and spinal canal: No prevertebral fluid or swelling. No visible canal hematoma. 1.4 cm nodule in the left thyroid  lobe. Disc levels: Disc space narrowing most pronounced at C6-7. Degenerative endplate osteophytes. Facet arthrosis at multiple levels. No high-grade osseous spinal canal stenosis. Foraminal stenosis at multiple levels. Upper chest: Emphysema. Other: Redemonstrated right carotid stent. IMPRESSION: No compression fracture or displaced fracture in the cervical spine. Electronically Signed   By: Donnice Mania M.D.   On: 09/14/2023 12:17   CT HEAD WO CONTRAST ( ) Result Date: 09/14/2023 CLINICAL DATA:  Head trauma, intracranial arterial injury suspected EXAM: CT HEAD WITHOUT CONTRAST TECHNIQUE: Contiguous axial images were obtained from the base of the skull  through the vertex without intravenous contrast. RADIATION DOSE REDUCTION: This exam was performed according to the departmental dose-optimization program which includes automated exposure control, adjustment of the mA and/or kV according to patient size and/or use of iterative reconstruction technique. COMPARISON:  CT of the head dated August 28, 2023. FINDINGS: Brain: There is age related atrophy and mild periventricular and deep cerebral white matter disease. There is no evidence of acute intracranial injury. There is no evidence of hemorrhage, mass, cortical infarct or hydrocephalus. Vascular: Negative. Skull: Intact and unremarkable. There is mild left forehead soft tissue swelling. Sinuses/Orbits: Clear paranasal sinuses and mastoid air cells. Status post bilateral lens replacement. Other: None. IMPRESSION: 1. Age-related atrophy and mild cerebral white matter disease. No evidence of acute intracranial injury. Electronically Signed   By: Evalene Coho M.D.   On: 09/14/2023 11:14    Assessment/Plan Right knee pain with suspected medial meniscus injury and osteoarthritis Chronic right knee pain with suspected medial meniscus injury and osteoarthritis. Symptoms include instability and swelling since a fall. Examination shows soft tissue swelling, warmth, tenderness on the medial joint  line, crepitus, and pain on internal and external rotation, suggesting a possible medial meniscus injury. Previous cortisone injection provided relief, indicating inflammation. Differential includes ligamentous injury or severe arthritis, which may necessitate surgical intervention. She prefers to avoid knee replacement surgery. - Order right knee x-ray - Consider MRI of the right knee if no progress with therapy and arthritis is not severe - Coordinate MRI scheduling and transportation if needed  Orthostatic hypotension Orthostatic hypotension with numerous falls. Current management includes midodrine . - Change  midodrine  to as-needed basis for low blood pressure  History of falls Falls likely related to orthostatic hypotension and knee instability.  Family/ staff Communication: nursing  Labs/tests ordered:  none

## 2023-10-21 ENCOUNTER — Ambulatory Visit: Admitting: Internal Medicine

## 2023-10-22 ENCOUNTER — Encounter: Payer: Self-pay | Admitting: Nurse Practitioner

## 2023-10-22 ENCOUNTER — Inpatient Hospital Stay: Admitting: Family Medicine

## 2023-10-22 ENCOUNTER — Non-Acute Institutional Stay (SKILLED_NURSING_FACILITY): Admitting: Nurse Practitioner

## 2023-10-22 DIAGNOSIS — I951 Orthostatic hypotension: Secondary | ICD-10-CM

## 2023-10-22 DIAGNOSIS — I5032 Chronic diastolic (congestive) heart failure: Secondary | ICD-10-CM

## 2023-10-22 NOTE — Progress Notes (Signed)
 Location:  Other [place].  Nursing Home Room Number: [facility] 108A Place of Service:  SNF ([phone]) Harlene An, NP  PCP: Randeen Laine LABOR, MD  Patient Care Team: Tower, Laine LABOR, MD as PCP - General (Family Medicine) End, Lonni, MD as PCP - Cardiology (Cardiology) Fernande Elspeth BROCKS, MD as Consulting Physician (Cardiology) Carolee Manus DASEN., MD as Referring Physician (Ophthalmology) Tower, Laine LABOR, MD as Consulting Physician (Family Medicine) Dessa Reyes ORN, MD (General Surgery)  Extended Emergency Contact Information Primary Emergency Contact: Weppler,William T Address: [address] [place] [address] [place] Mobile Phone: [phone] Relation: Spouse  Goals of care: Advanced Directive information    09/26/2023    3:00 PM  Advanced Directives  Does Patient Have a Medical Advance Directive? Yes  Type of Estate agent of [place];Living will  Does patient want to make changes to medical advance directive? No - Patient declined  Copy of Healthcare Power of Attorney in Chart? No - copy requested     Chief Complaint  Patient presents with   Weight Gain    Weight Gain    HPI:  Pt is a 73 y.o. female seen today for an acute visit for Weight Gain.  Staff noted 6 lbs weigh gain in 1 week. Also reports of worsening shortness of breath and O2 around 90% where it was staying over 95%. She is on chronic O2 at 4L  She is at [facility] after hospitalization from hypotension and syncopal episodes.  She was prescribed midodrine  due to hypotension but now only PRN and has not needed it Over the last week she has become more short of breath, occasional cough with while phlegm and LE edema- more so feet and ankles.  Denies chest pains or palpitation.     Past Medical History:  Diagnosis Date   Allergic rhinitis    Arthritis    Asthma    as a child, mild now   Breast cancer (HCC) 12/2015   right breast cancer,  lumpectomy and mammosite    Colon polyps    colonoscopy 7/08, tubular adenoma   Complete heart block (HCC) 09/2011   s/p PPM implanted in [facility]   COPD (chronic obstructive pulmonary disease) (HCC)    Myocardial infarction (HCC) 2011   Osteopenia 10/2015   Pacemaker    2011   Paroxysmal atrial fibrillation (HCC) 03/2021   Incidentally detected on pacemaker interrogation   Personal history of radiation therapy 2017   right breast ca, mammosite placed   Rotator cuff tear, right 02/2022   Had an MRI in [place] I have 2 tears.   Seizure disorder (HCC)    Seizures (HCC)    first one was when she was 73 years old    Small bowel obstruction (HCC)    1988 and 2002   Tobacco abuse    Past Surgical History:  Procedure Laterality Date   ABDOMINAL HYSTERECTOMY     BREAST BIOPSY Right 2007   benign inflammatory changes, mass due to underwire bra   BREAST BIOPSY Left 01/02/2016   columnar cell changes without atypical hyperplasia.   BREAST BIOPSY Right 12/06/2015   rt breast mass 10:00, bx done at Dr. Gilberto office, invasive ductal carcinoma   BREAST EXCISIONAL BIOPSY Left 01/02/2016   COLUMNAR CELL CHANGE AND HYPERPLASIA ASSOCIATED WITH LUMINAL AND STROMAL CALCIFICATIONS   BREAST LUMPECTOMY Right 01/02/2016   invasive mammary carcinoma, clear margins,  negative LN   BREAST LUMPECTOMY WITH SENTINEL LYMPH NODE BIOPSY Right 01/02/2016   pT1c, N0; ER/ PR 100%; Her 2 neu not over expressed: BREAST LUMPECTOMY WITH SENTINEL LYMPH NODE BX;  Surgeon: Reyes LELON Cota, MD;  Location: [facility] ORS;  Service: General;  Laterality: Right;   CARDIAC CATHETERIZATION     CAROTID PTA/STENT INTERVENTION Right 04/29/2022   Procedure: CAROTID PTA/STENT INTERVENTION;  Surgeon: Jama Cordella MATSU, MD;  Location: [facility] INVASIVE CV LAB;  Service: Cardiovascular;  Laterality: Right;   CATARACT EXTRACTION Bilateral    COLONOSCOPY  10/2015   Dr Teressa   COLONOSCOPY WITH PROPOFOL  N/A 08/19/2018   Procedure:  COLONOSCOPY WITH PROPOFOL ;  Surgeon: Janalyn Keene NOVAK, MD;  Location: [facility] ENDOSCOPY;  Service: Endoscopy;  Laterality: N/A;   ESOPHAGOGASTRODUODENOSCOPY (EGD) WITH PROPOFOL  N/A 08/19/2018   Procedure: ESOPHAGOGASTRODUODENOSCOPY (EGD) WITH PROPOFOL ;  Surgeon: Janalyn Keene NOVAK, MD;  Location: [facility] ENDOSCOPY;  Service: Endoscopy;  Laterality: N/A;   EXPLORATORY LAPAROTOMY  01/25/2001   Exploratory laparotomy, lysis of adhesions, identification of internal hernia secondary to omental adhesion. Prolonged postoperative ileus.   gyn surgery  1993   hysterectomy- form endometriosis   LAPAROSCOPY     PACEMAKER INSERTION  10/24/11   [employer] Advantio dual chamber PPM implanted by Dr Ronny at [facility] in [place]   PPM GENERATOR CHANGEOUT N/A 12/05/2019   Procedure: PPM GENERATOR HERMA;  Surgeon: Fernande Elspeth BROCKS, MD;  Location: [facility] INVASIVE CV LAB;  Service: Cardiovascular;  Laterality: N/A;   RIGHT/LEFT HEART CATH AND CORONARY ANGIOGRAPHY Bilateral 07/19/2019   Procedure: RIGHT/LEFT HEART CATH AND CORONARY ANGIOGRAPHY;  Surgeon: Mady Bruckner, MD;  Location: [facility] INVASIVE CV LAB;  Service: Cardiovascular;  Laterality: Bilateral;   TEMPORARY PACEMAKER N/A 12/05/2019   Procedure: TEMPORARY PACEMAKER;  Surgeon: Fernande Elspeth BROCKS, MD;  Location: [facility] INVASIVE CV LAB;  Service: Cardiovascular;  Laterality: N/A;    Allergies  Allergen Reactions   Codeine Nausea And Vomiting   Morphine  And Codeine Nausea Only    Outpatient Encounter Medications as of 10/22/2023  Medication Sig   acetaminophen  (TYLENOL ) 500 MG tablet Take 500-1,000 mg by mouth every 6 (six) hours as needed for mild pain or headache.    albuterol  (VENTOLIN  HFA) 108 (90 Base) MCG/ACT inhaler INHALE 2 PUFFS BY MOUTH EVERY 4 HOURS AS NEEDED FOR WHEEZING OR SHORTNESS OF BREATH   apixaban  (ELIQUIS ) 5 MG TABS tablet Take 1 tablet (5 mg total) by mouth 2 (two) times daily.   aspirin  EC 81 MG tablet Take 1  tablet (81 mg total) by mouth daily.   atorvastatin  (LIPITOR) 40 MG tablet Take 1 tablet by mouth once daily   benzonatate  (TESSALON ) 200 MG capsule Take 1 capsule (200 mg total) by mouth 2 (two) times daily as needed for cough.   Cholecalciferol  (VITAMIN D3) 50 MCG (2000 UT) capsule Take 2,000 Units by mouth daily.   fludrocortisone  (FLORINEF ) 0.1 MG tablet Take 2 tablets (0.2 mg total) by mouth daily.   FLUoxetine  (PROZAC ) 20 MG capsule Take 1 capsule by mouth once daily   gabapentin  (NEURONTIN ) 100 MG capsule Take 2 capsules (200 mg total) by mouth 2 (two) times daily as needed.   levETIRAcetam  (KEPPRA ) 250 MG tablet Take 250 mg by mouth 2 (two) times daily.    melatonin 3 MG TABS tablet Take 3 mg by mouth at bedtime.   midodrine  (PROAMATINE ) 10 MG tablet Take 1 tablet (10 mg total) by mouth 3 (three) times daily with meals.  OXYGEN  Inhale 4 L into the lungs continuous.   pantoprazole  (PROTONIX ) 40 MG tablet Take 1 tablet (40 mg total) by mouth daily.   potassium chloride  (KLOR-CON ) 10 MEQ tablet Take 10 mEq by mouth daily.   pyridostigmine  (MESTINON ) 60 MG tablet Take 1 tablet (60 mg total) by mouth 3 (three) times daily.   Tiotropium Bromide-Olodaterol (STIOLTO RESPIMAT ) 2.5-2.5 MCG/ACT AERS INHALE 2 PUFFS BY MOUTH ONCE DAILY   No facility-administered encounter medications on file as of 10/22/2023.    Review of Systems  Constitutional:  Positive for unexpected weight change. Negative for activity change, appetite change and fatigue.  HENT:  Negative for congestion and hearing loss.   Eyes: Negative.   Respiratory:  Positive for cough and shortness of breath. Negative for wheezing.   Cardiovascular:  Positive for leg swelling. Negative for chest pain and palpitations.  Gastrointestinal:  Negative for abdominal pain, constipation and diarrhea.  Genitourinary:  Negative for difficulty urinating and dysuria.  Musculoskeletal:  Negative for arthralgias and myalgias.  Skin:  Negative for  color change and wound.  Neurological:  Negative for dizziness and weakness.  Psychiatric/Behavioral:  Negative for agitation, behavioral problems and confusion.     Immunization History  Administered Date(s) Administered    sv, Bivalent, Protein Subunit Rsvpref,pf (Abrysvo) 12/31/2022   Fluad Quad(high Dose 65+) 10/24/2020   INFLUENZA, HIGH DOSE SEASONAL PF 11/04/2018, 11/22/2019, 12/31/2022   Influenza Whole 02/24/2005   Influenza,inj,Quad PF,6+ Mos 12/03/2012, 12/09/2013, 01/16/2015, 02/06/2016, 11/04/2016, 12/08/2017   Influenza-Unspecified 10/25/2021   PFIZER Comirnaty(Gray Top)Covid-19 Tri-Sucrose Vaccine 07/06/2020   PFIZER(Purple Top)SARS-COV-2 Vaccination 02/28/2019, 03/21/2019, 11/14/2019   PNEUMOCOCCAL CONJUGATE-20 07/06/2020   Pneumococcal Conjugate-13 07/11/2014   Pneumococcal Polysaccharide-23 03/21/2005, 07/26/2015   Td 01/09/1999, 06/14/2007   Zoster, Live 09/07/2015   Pertinent  Health Maintenance Due  Topic Date Due   INFLUENZA VACCINE  09/25/2023   Colonoscopy  03/24/2024 (Originally 08/18/2021)   MAMMOGRAM  01/06/2024   DEXA SCAN  Completed      09/18/2022    9:32 AM 10/08/2022   11:38 AM 03/06/2023    1:12 PM 03/25/2023    2:04 PM 05/08/2023   11:10 AM  Fall Risk  Falls in the past year? 1 1 0 0 0  Was there an injury with Fall? 1 1 0 0 0  Fall Risk Category Calculator 2 2 0 0 0  Patient at Risk for Falls Due to History of fall(s)  No Fall Risks No Fall Risks No Fall Risks  Fall risk Follow up Falls evaluation completed Falls evaluation completed Education provided;Falls prevention discussed Falls evaluation completed Falls evaluation completed   Functional Status Survey:    Vitals:   10/22/23 0904 10/22/23 0916  BP: (!) 166/82 (!) 167/82  Pulse: 79   Resp: 18   Temp: (!) 97.2 F (36.2 C)   SpO2: (!) 88%   Weight: 208 lb 3.2 oz (94.4 kg)   Height: 5' 8 (1.727 m)    Body mass index is 31.66 kg/m. Physical Exam Constitutional:      General:  She is not in acute distress.    Appearance: She is well-developed. She is not diaphoretic.  HENT:     Head: Normocephalic and atraumatic.     Mouth/Throat:     Pharynx: No oropharyngeal exudate.  Eyes:     Conjunctiva/sclera: Conjunctivae normal.     Pupils: Pupils are equal, round, and reactive to light.  Cardiovascular:     Rate and Rhythm: Normal rate and regular rhythm.  Heart sounds: Normal heart sounds.  Pulmonary:     Effort: Pulmonary effort is normal.     Breath sounds: Normal breath sounds.  Abdominal:     General: Bowel sounds are normal.     Palpations: Abdomen is soft.  Musculoskeletal:     Cervical back: Normal range of motion and neck supple.     Right lower leg: No edema.     Left lower leg: No edema.     Comments: Minimal LE edema  Skin:    General: Skin is warm and dry.  Neurological:     Mental Status: She is alert.  Psychiatric:        Mood and Affect: Mood normal.     Labs reviewed: Recent Labs    09/02/23 0507 09/03/23 0458 09/04/23 0536 09/05/23 9374 09/26/23 0520 09/28/23 0553 09/30/23 0446 10/01/23 0523 10/02/23 0236 10/05/23 0000 10/12/23 0000  NA 134* 134* 133*   < > 138   < > 136 137 137 138 138  K 4.1 4.2 4.3   < > 2.9*   < > 3.3* 3.2* 3.5 3.8 3.7  CL 104 101 101   < > 102   < > 99 102 100 100 104  CO2 22 21* 22   < > 27   < > 28 25 28  30* 25*  GLUCOSE 105* 108* 110*   < > 119*   < > 107* 110* 110*  --   --   BUN 14 19 14    < > 13   < > 20 14 13   --  14  CREATININE 0.82 0.74 0.87   < > 0.64   < > 0.83 0.67 0.75 0.7 0.8  CALCIUM  9.0 9.0 8.9   < > 8.6*   < > 8.9 8.9 9.0 9.1 9.0  MG 2.0 2.0 2.0  --  1.7  --   --   --   --   --   --   PHOS 3.0 3.5 3.3  --   --   --   --   --   --   --   --    < > = values in this interval not displayed.   Recent Labs    08/29/23 0428 09/14/23 1129 09/25/23 2305  AST 17 21 17   ALT 11 13 12   ALKPHOS 68 77 83  BILITOT 1.0 0.9 0.9  PROT 6.9 7.1 7.8  ALBUMIN 3.3* 3.5 3.9   Recent Labs     09/14/23 1128 09/25/23 2305 09/30/23 0446 10/01/23 0523 10/02/23 0236 10/05/23 0000 10/12/23 0000  WBC 8.1   < > 7.2 7.7 8.5 6.4 6.6  NEUTROABS 6.0  --   --   --   --  4,493.00 4,706.00  HGB 11.2*   < > 9.1* 10.2* 9.7* 8.7* 8.8*  HCT 34.2*   < > 27.8* 31.7* 30.2* 27* 28*  MCV 90.5   < > 87.4 86.8 88.3  --   --   PLT 426*   < > 372 386 381 413* 484*   < > = values in this interval not displayed.   Lab Results  Component Value Date   TSH 0.783 08/29/2023   Lab Results  Component Value Date   HGBA1C 6.2 03/18/2023   Lab Results  Component Value Date   CHOL 156 03/18/2023   HDL 64.10 03/18/2023   LDLCALC 72 03/18/2023   TRIG 98.0 03/18/2023   CHOLHDL 2 03/18/2023    Significant Diagnostic  Results in last 30 days:  EEG adult Result Date: 09/28/2023 Gregg Lek, MD     09/28/2023  4:37 PM Patient Name: TALULAH SCHIRMER MRN: [id number] Epilepsy Attending: Lek Gregg Referring Physician/Provider: No ref. provider found     Date: 09/28/2023 Duration: 30 minutes Patient history: Patient with severe orthostatic hypotension, presenting with syncope. EEG to evaluate for seizure Level of alertness: Awake, drowsy AEDs during EEG study: LEV Technical aspects: This EEG study was done with scalp electrodes positioned according to the 10-20 International system of electrode placement. Electrical activity was reviewed with band pass filter of 1-70Hz , sensitivity of 7 uV/mm, display speed of 57mm/sec with a 60Hz  notched filter applied as appropriate. EEG data were recorded continuously and digitally stored.  Video monitoring was available and reviewed as appropriate. Description: The posterior dominant rhythm consists of 9-10 Hz activity of moderate voltage (25-35 uV) seen predominantly in posterior head regions, symmetric and reactive to eye opening and eye closing. Drowsiness was characterized by attenuation of the posterior background rhythm. Sleep was not seen. Hyperventilation and photic stimulation  were not performed.   ABNORMALITY -None IMPRESSION: This study is within normal limits. No seizures or epileptiform discharges were seen throughout the recording. A normal interictal EEG does not exclude nor support the diagnosis of epilepsy. Lek Gregg MD Neurology     DG Ankle Complete Right Result Date: 09/26/2023 CLINICAL DATA:  Pain.  Fall. EXAM: RIGHT ANKLE - COMPLETE 3+ VIEW COMPARISON:  None Available. FINDINGS: Punctate linear foci of mineralization adjacent to the tip of the medial malleolus are suspicious for ligamentous injury. The remainder of the visualized bones appear intact. No malalignment. Ankle mortise is congruent. Joint spaces are relatively maintained. Soft tissue swelling along the medial ankle. IMPRESSION: Punctate linear foci of mineralization adjacent to the tip of the medial malleolus are suspicious for ligamentous injury. Overlying soft tissue swelling. Electronically Signed   By: Harrietta Sherry M.D.   On: 09/26/2023 16:22    Assessment/Plan 1. Chronic heart failure with preserved ejection fraction (HFpEF) (HCC) (Primary) Previously has lasix  40 mg PRN for weight gain but was stopped during last hospitalization Will have her take lasix  40 mg daily for 2 days due to worsening shortness of breath, decrease sats, cough and LE edema.  Staff to continue to monitor VS, weight and edema and notify for ongoing on worsening of symptoms.   2. Orthostatic hypotension Has improved but for staff to be cautious with addition of lasix  which can increase risk of hypotension.      Blyss Lugar K. Caro BODILY [facility] [phone]

## 2023-10-26 ENCOUNTER — Encounter: Payer: Self-pay | Admitting: Cardiology

## 2023-10-26 NOTE — Progress Notes (Unsigned)
 Electrophysiology Clinic Note    Date:  10/27/2023  Patient ID:  Diana Cook, DOB 11/20/1950, MRN [id number] PCP:  Randeen Laine LABOR, MD  Cardiologist:  Lonni Hanson, MD   Electrophysiologist:  OLE ONEIDA HOLTS, MD  Electrophysiology APP:  Tyeson Tanimoto, NP     Discussed the use of AI scribe software for clinical note transcription with the patient, who gave verbal consent to proceed.   Patient Profile    Chief Complaint: PPM, AF follow-up  History of Present Illness: Diana Cook is a 73 y.o. female with PMH notable for CHB s/p PPM, parox AFib, orthostatic hypotension, non-obs CAD, carotid artery stenosis, seizure disorder, COPD on home Os, former smoker; seen today for OLE ONEIDA HOLTS, MD for routine electrophysiology followup.   She last saw Dr. Fernande 11/2022 where she was having difficulties being active.  She was hospitalized 08/2023 x 2 at [facility] and an additional admission at [facility] d/t syncope from orthostatic hypotension.   On follow-up today, she has not had any further syncopal episodes since the addition of mestinon  along with florinef  and midodrine . She was initially discharged to rehab facility and has since returned home. She continues to use compression garments, is not wearing any today. She continues to take eliquis  BID, no bleeding concerns though dose have bruising to bilateral arms and hands.  She has PRN lasix  to take for increased edema, has not taken it in awhile but she does think her legs have edema today. She is concerned about her elevated BP at home, most readings are 150-160 at home.    Arrhythmia/Device History Bos Sci dual chamber PPM, imp 2013; dx CHB - gen change 2021    ROS:  Please see the history of present illness. All other systems are reviewed and otherwise negative.    Physical Exam    VS:  BP (!) 163/73 (BP Location: Left Arm, Patient Position: Sitting, Cuff Size: Normal)   Pulse 72   Ht 5' 9 (1.753 m)   Wt 210 lb (95.3 kg)    SpO2 (!) 86%   BMI 31.01 kg/m  BMI: Body mass index is 31.01 kg/m.  Orthostatic VS for the past 24 hrs (Last 3 readings):  BP- Lying Pulse- Lying BP- Sitting Pulse- Sitting BP- Standing at 0 minutes Pulse- Standing at 0 minutes BP- Standing at 3 minutes Pulse- Standing at 3 minutes  10/27/23 1310 169/81 67 158/80 69 156/70 67 149/75 78     Wt Readings from Last 3 Encounters:  10/27/23 210 lb (95.3 kg)  10/22/23 208 lb 3.2 oz (94.4 kg)  10/12/23 201 lb 3.2 oz (91.3 kg)     GEN- The patient is well appearing, alert and oriented x 3 today.   Lungs- diminished throughout, normal work of breathing, Sentinel in place Heart- Regular rate and rhythm, no murmurs, rubs or gallops Extremities- Trace peripheral edema, warm, dry Skin-  device pocket well-healed, no tethering   Device interrogation done today and reviewed by myself:  Battery 10years Lead thresholds, impedence, sensing stable  No episodes No changes made today   Studies Reviewed   Previous EP, cardiology notes.    EKG is not ordered. Personal review of EKG from 09/25/2023 shows:  AS-VP at 86        TTE, 07/30/2023  1. Left ventricular ejection fraction, by estimation, is 55 to 60%. Left ventricular ejection fraction by 2D MOD biplane is 56.1 %. The left ventricle has normal function. The left ventricle has  no regional wall motion abnormalities. Left ventricular diastolic parameters are consistent with Grade I diastolic dysfunction (impaired relaxation).   2. Right ventricular systolic function is normal. The right ventricular size is normal.   3. The mitral valve is normal in structure. Trivial mitral valve regurgitation.   4. The aortic valve is tricuspid. Aortic valve regurgitation is not visualized.   TTE, 06/26/2021  1. Left ventricular ejection fraction, by estimation, is 55 to 60%. The left ventricle has normal function. Septal wall motion abnormality consistent with bundle branch block.   2. There is mild left  ventricular hypertrophy. Left ventricular diastolic parameters are consistent with Grade I diastolic dysfunction (impaired relaxation).   3. Right ventricular systolic function is normal. The right ventricular size is normal. There is normal pulmonary artery systolic pressure. The estimated right ventricular systolic pressure is 31.6 mmHg.   4. The mitral valve is normal in structure. No evidence of mitral valve regurgitation. No evidence of mitral stenosis.   5. The aortic valve was not well visualized. Aortic valve regurgitation is not visualized. No aortic stenosis is present.   6. The inferior vena cava is normal in size with greater than 50% respiratory variability, suggesting right atrial pressure of 3 mmHg.    Assessment and Plan     #) CHB s/p PPM Device functioning well, see paceart for details 100% VP LVEF normal, no plans to upgrade device  #) parox AFib No AF episodes on PPM  #) Hypercoag d/t afib CHA2DS2-VASc Score = at least 5 [CHF History: 0, HTN History: 0, Diabetes History: 0, Stroke History: 2, Vascular Disease History: 1, Age Score: 1, Gender Score: 1].  Therefore, the patient's annual risk of stroke is 7.2 %.    Stroke ppx - 5mg  eliquis  BID, appropriately dosed No bleeding concerns  #) orthostatic hypotension #) syncope Orthostatics negative today in clinic No recent syncopal episodes Continue 0.2mg  florinef  BID, 10mg  midodrine  TID, and 60mg  pyridostigmine  We discussed discussing ongoing refills of medications with PCP Continue compression garments and increased PO fluid Recommend she use PRN lasix  very sparingly       Current medicines are reviewed at length with the patient today.   The patient has concerns regarding her medicines.  The following changes were made today:  none  Labs/ tests ordered today include:  No orders of the defined types were placed in this encounter.    Disposition: Follow up with Dr. Cindie or EP APP in 12  months   Signed, Kellianne Ek, NP  10/27/23  2:27 PM  Electrophysiology [facility]

## 2023-10-27 ENCOUNTER — Ambulatory Visit: Attending: Cardiology | Admitting: Cardiology

## 2023-10-27 ENCOUNTER — Other Ambulatory Visit: Payer: Self-pay | Admitting: Family Medicine

## 2023-10-27 VITALS — BP 163/73 | HR 72 | Ht 69.0 in | Wt 210.0 lb

## 2023-10-27 DIAGNOSIS — I951 Orthostatic hypotension: Secondary | ICD-10-CM

## 2023-10-27 DIAGNOSIS — Z950 Presence of cardiac pacemaker: Secondary | ICD-10-CM

## 2023-10-27 DIAGNOSIS — R55 Syncope and collapse: Secondary | ICD-10-CM

## 2023-10-27 DIAGNOSIS — I480 Paroxysmal atrial fibrillation: Secondary | ICD-10-CM

## 2023-10-27 DIAGNOSIS — I442 Atrioventricular block, complete: Secondary | ICD-10-CM

## 2023-10-27 LAB — CUP PACEART INCLINIC DEVICE CHECK
Date Time Interrogation Session: 20250902143023
Implantable Lead Connection Status: 753985
Implantable Lead Connection Status: 753985
Implantable Lead Implant Date: 20130830
Implantable Lead Implant Date: 20130830
Implantable Lead Location: 753859
Implantable Lead Location: 753860
Implantable Lead Model: 4456
Implantable Lead Model: 4479
Implantable Lead Serial Number: 473325
Implantable Lead Serial Number: 523784
Implantable Pulse Generator Implant Date: 20211011
Lead Channel Impedance Value: 384 Ohm
Lead Channel Impedance Value: 519 Ohm
Lead Channel Pacing Threshold Amplitude: 0.6 V
Lead Channel Pacing Threshold Amplitude: 0.7 V
Lead Channel Pacing Threshold Pulse Width: 0.4 ms
Lead Channel Pacing Threshold Pulse Width: 0.4 ms
Lead Channel Sensing Intrinsic Amplitude: 3.4 mV
Lead Channel Setting Pacing Amplitude: 2 V
Lead Channel Setting Pacing Amplitude: 2 V
Lead Channel Setting Pacing Pulse Width: 0.4 ms
Lead Channel Setting Sensing Sensitivity: 2.5 mV
Pulse Gen Serial Number: 949303
Zone Setting Status: 755011

## 2023-10-27 NOTE — Patient Instructions (Signed)
 Medication Instructions:  Your physician recommends that you continue on your current medications as directed. Please refer to the Current Medication list given to you today.  *If you need a refill on your cardiac medications before your next appointment, please call your pharmacy*  Lab Work: No labs ordered today  If you have labs (blood work) drawn today and your tests are completely normal, you will receive your results only by: MyChart Message (if you have MyChart) OR A paper copy in the mail If you have any lab test that is abnormal or we need to change your treatment, we will call you to review the results.  Testing/Procedures: No test ordered today   Follow-Up: At [facility], you and your health needs are our priority.  As part of our continuing mission to provide you with exceptional heart care, our providers are all part of one team.  This team includes your primary Cardiologist (physician) and Advanced Practice Providers or APPs (Physician Assistants and Nurse Practitioners) who all work together to provide you with the care you need, when you need it.  Your next appointment:   1 year(s)  Provider:   Suzann Riddle, NP

## 2023-10-27 NOTE — Telephone Encounter (Unsigned)
 Copied from CRM [id number]. Topic: Clinical - Medication Refill >> Oct 27, 2023  3:13 PM [place] J wrote: Medication: midodrine  (PROAMATINE ) 10 MG tablet  Has the patient contacted their pharmacy? No (Agent: If no, request that the patient contact the pharmacy for the refill. If patient does not wish to contact the pharmacy document the reason why and proceed with request.) (Agent: If yes, when and what did the pharmacy advise?)  This is the patient's preferred pharmacy:  [facility] [address] [place] - [address] [place] [address] Phone: [phone] Fax: [phone]  Is this the correct pharmacy for this prescription? Yes If no, delete pharmacy and type the correct one.   Has the prescription been filled recently? No  Is the patient out of the medication? No  Has the patient been seen for an appointment in the last year OR does the patient have an upcoming appointment? Yes  Can we respond through MyChart? Yes  Agent: Please be advised that Rx refills may take up to 3 business days. We ask that you follow-up with your pharmacy.

## 2023-10-29 ENCOUNTER — Ambulatory Visit (INDEPENDENT_AMBULATORY_CARE_PROVIDER_SITE_OTHER)
Admission: RE | Admit: 2023-10-29 | Discharge: 2023-10-29 | Disposition: A | Discharge status: Home / Self Care | Source: Ambulatory Visit | Attending: Family Medicine | Admitting: Family Medicine

## 2023-10-29 ENCOUNTER — Encounter: Payer: Self-pay | Admitting: Family Medicine

## 2023-10-29 ENCOUNTER — Ambulatory Visit: Payer: Self-pay | Admitting: Family Medicine

## 2023-10-29 ENCOUNTER — Ambulatory Visit: Admitting: Family Medicine

## 2023-10-29 VITALS — BP 106/58 | HR 74 | Temp 97.6°F

## 2023-10-29 DIAGNOSIS — E876 Hypokalemia: Secondary | ICD-10-CM

## 2023-10-29 DIAGNOSIS — I951 Orthostatic hypotension: Secondary | ICD-10-CM

## 2023-10-29 DIAGNOSIS — J9611 Chronic respiratory failure with hypoxia: Secondary | ICD-10-CM

## 2023-10-29 DIAGNOSIS — J432 Centrilobular emphysema: Secondary | ICD-10-CM | POA: Diagnosis not present

## 2023-10-29 DIAGNOSIS — R053 Chronic cough: Secondary | ICD-10-CM | POA: Diagnosis not present

## 2023-10-29 DIAGNOSIS — D649 Anemia, unspecified: Secondary | ICD-10-CM | POA: Diagnosis not present

## 2023-10-29 DIAGNOSIS — R55 Syncope and collapse: Secondary | ICD-10-CM

## 2023-10-29 DIAGNOSIS — E611 Iron deficiency: Secondary | ICD-10-CM

## 2023-10-29 LAB — CBC WITH DIFFERENTIAL/PLATELET
Basophils Absolute: 0.1 K/uL (ref 0.0–0.1)
Basophils Relative: 1 % (ref 0.0–3.0)
Eosinophils Absolute: 0.2 K/uL (ref 0.0–0.7)
Eosinophils Relative: 1.9 % (ref 0.0–5.0)
HCT: 32.8 % — ABNORMAL LOW (ref 36.0–46.0)
Hemoglobin: 10.7 g/dL — ABNORMAL LOW (ref 12.0–15.0)
Lymphocytes Relative: 8.2 % — ABNORMAL LOW (ref 12.0–46.0)
Lymphs Abs: 0.8 K/uL (ref 0.7–4.0)
MCHC: 32.5 g/dL (ref 30.0–36.0)
MCV: 84.4 fl (ref 78.0–100.0)
Monocytes Absolute: 0.7 K/uL (ref 0.1–1.0)
Monocytes Relative: 7.9 % (ref 3.0–12.0)
Neutro Abs: 7.4 K/uL (ref 1.4–7.7)
Neutrophils Relative %: 81 % — ABNORMAL HIGH (ref 43.0–77.0)
Platelets: 313 K/uL (ref 150.0–400.0)
RBC: 3.89 Mil/uL (ref 3.87–5.11)
RDW: 16.7 % — ABNORMAL HIGH (ref 11.5–15.5)
WBC: 9.1 K/uL (ref 4.0–10.5)

## 2023-10-29 LAB — BASIC METABOLIC PANEL WITH GFR
BUN: 13 mg/dL (ref 6–23)
CO2: 26 meq/L (ref 19–32)
Calcium: 8.8 mg/dL (ref 8.4–10.5)
Chloride: 102 meq/L (ref 96–112)
Creatinine, Ser: 0.88 mg/dL (ref 0.40–1.20)
GFR: 64.36 mL/min (ref >60.00)
Glucose, Bld: 126 mg/dL — ABNORMAL HIGH (ref 70–99)
Potassium: 2.9 meq/L — ABNORMAL LOW (ref 3.5–5.1)
Sodium: 139 meq/L (ref 135–145)

## 2023-10-29 LAB — IRON: Iron: 24 ug/dL — ABNORMAL LOW (ref 42–145)

## 2023-10-29 LAB — FERRITIN: Ferritin: 83.9 ng/mL (ref 10.0–291.0)

## 2023-10-29 MED ORDER — POTASSIUM CHLORIDE ER 10 MEQ PO TBCR: 30.0000 meq | EXTENDED_RELEASE_TABLET | Freq: Every day | ORAL | 0 refills | Status: DC

## 2023-10-29 MED ORDER — AMOXICILLIN-POT CLAVULANATE 875-125 MG PO TABS
1.0000 | ORAL_TABLET | Freq: Two times a day (BID) | ORAL | 0 refills | Status: DC
Start: 2023-10-29 — End: 2023-11-24

## 2023-10-29 MED ORDER — MIDODRINE HCL 10 MG PO TABS: 10.0000 mg | ORAL_TABLET | Freq: Three times a day (TID) | ORAL | 5 refills | Status: DC

## 2023-10-29 NOTE — Telephone Encounter (Signed)
 Rx was refilled today at appt

## 2023-10-29 NOTE — Assessment & Plan Note (Signed)
 Last hospitalization was 09/2023 Reviewed hospital records, lab results and studies in detail  Also reviewed cardiology visit  Encouraged to continue compression garments Also Continue 0.2mg  florinef  BID, 10 mg midodrine  TID, and 60mg  pyridostigmine   Pt has to pick up the midodrine  today and start back on it  BP: (!) 106/58  Continue to monitor

## 2023-10-29 NOTE — Assessment & Plan Note (Signed)
 No more episodes since taking 3 med regimen for orthostatic hypotension  Continue 0.2mg  florinef  BID, 10mg  midodrine  TID, and 60mg  pyridostigmine 

## 2023-10-29 NOTE — Progress Notes (Signed)
 Subjective:    Patient ID: Diana Cook, female    DOB: 1950-07-22, 73 y.o.   MRN: [id number]  HPI  Wt Readings from Last 3 Encounters:  10/27/23 210 lb (95.3 kg)  10/22/23 208 lb 3.2 oz (94.4 kg)  10/12/23 201 lb 3.2 oz (91.3 kg)      Vitals:   10/29/23 1158  BP: (!) 106/58  Pulse: 74  Temp: 97.6 F (36.4 C)  SpO2: (!) 86%    Pt presents for follow up of syncope/orthostasis    Saw cardiology on 9/2 Noted no further syncope on Mestinon  Florinef  Midodrine   Compression garments / abdominal binder   Per their note:  #) orthostatic hypotension #) syncope Orthostatics negative today in clinic No recent syncopal episodes Continue 0.2mg  florinef  BID, 10mg  midodrine  TID, and 60mg  pyridostigmine  We discussed discussing ongoing refills of medications with PCP Continue compression garments and increased PO fluid Recommend she use PRN lasix  very sparingly   Also sustained knee injury from last fall /caught leg under a bed  Acute on chronic right knee pain  Possible medial meniscus injury  Moderate OA on xray 2024 -medial comp Xray was ordered at nursing home  Bone on bone arthritis -has appointment with Dr Cleotilde today        Was hospitalized in August Than had rehab stay  Is now at home     Anticoagulated with eliquis  for a fib   Copd On 5L of 02 Her sat still remains below 90  Periodic cough  Not productive  If any phlegm it is clear   Today did not have the midodrine  -so blood pressure is a little lower  Is waiting at the pharmacy   Cxr today DG Chest 2 View Result Date: 10/29/2023 CLINICAL DATA:  Nonproductive cough EXAM: CHEST - 2 VIEW COMPARISON:  Chest radiograph dated 08/28/2023 FINDINGS: Left chest wall pacemaker leads project over the right atrium and ventricle. Normal lung volumes. Increased platelike density in the right mid lung. New ovoid opacity in the left lower lobe. Trace blunting of bilateral costophrenic angles. No pneumothorax.  Similar mildly enlarged cardiomediastinal silhouette. No acute osseous abnormality. IMPRESSION: 1. Increased platelike density in the right mid lung, likely atelectasis. 2. New ovoid opacity in the left lower lobe, which may represent atelectasis or pneumonia. Recommend follow-up chest radiograph in 8-12 weeks to ensure resolution. 3. Trace blunting of bilateral costophrenic angles, which may represent trace pleural effusions. Electronically Signed   By: Limin  Xu M.D.   On: 10/29/2023 13:25   CUP PACEART INCLINIC DEVICE CHECK Result Date: 10/27/2023 Normal in-clinic _dual__ chamber pacemaker check. Presenting Rhythm: _AP-VP__ . Routine testing of thresholds, sensing, and impedance demonstrate stable parameters and no programming changes needed at this time. No episodes. Estimated longevity _10 years___ . Pt enrolled in remote follow-up. [name], NP      Lab Results  Component Value Date   NA 138 10/12/2023   K 3.7 10/12/2023   CO2 25 (A) 10/12/2023   GLUCOSE 110 (H) 10/02/2023   BUN 14 10/12/2023   CREATININE 0.8 10/12/2023   CALCIUM  9.0 10/12/2023   GFR 47.33 (L) 05/05/2023   EGFR 79 10/12/2023   GFRNONAA >60 10/02/2023   Lab Results  Component Value Date   ALT 12 09/25/2023   AST 17 09/25/2023   ALKPHOS 83 09/25/2023   BILITOT 0.9 09/25/2023   Lab Results  Component Value Date   WBC 6.6 10/12/2023   HGB 8.8 (A) 10/12/2023   HCT  28 (A) 10/12/2023   MCV 88.3 10/02/2023   PLT 484 (A) 10/12/2023   Lab Results  Component Value Date   IRON 79 09/18/2022   FERRITIN 113.2 11/04/2021    Lab Results  Component Value Date   TSH 0.783 08/29/2023       Patient Active Problem List   Diagnosis Date Noted   Syncope and collapse 09/26/2023   Hypokalemia 09/26/2023   Carotid stenosis s/p stents 09/26/2023   Chronic anticoagulation 09/26/2023   Physical deconditioning 09/18/2023   Syncope 08/29/2023   Orthostatic hypotension 08/28/2023   Decreased GFR 04/29/2023    Presence of heart assist device (HCC) 03/25/2023   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/25/2023   Persistent cough 03/03/2023   Urinary urgency 02/09/2023   Carotid stenosis, symptomatic w/o infarct 03/23/2022   Right shoulder pain 11/25/2021   Anemia 11/03/2021   Thyroid  nodule 09/17/2021   Dyslipidemia 09/05/2021   Depression 09/05/2021   GERD without esophagitis 09/05/2021   Vitamin D  deficiency 08/20/2021   Colon cancer screening 07/30/2021   Hypercalcemia 07/29/2021   UTI (urinary tract infection) 07/17/2021   Dysuria 07/08/2021   Arterial hypotension 07/08/2021   Paroxysmal atrial fibrillation (HCC) 04/11/2021   Current use of proton pump inhibitor 10/24/2020   Sleep apnea 10/24/2020   Fatigue 10/24/2020   Lightheadedness 05/16/2020   Radicular pain in left arm 03/02/2020   Neck pain on left side 02/15/2020   Paresthesia 02/15/2020   Chronic respiratory failure with hypoxia (HCC) 11/17/2019   Chronic heart failure with preserved ejection fraction (HFpEF) (HCC) 08/11/2019   Coronary artery disease involving native coronary artery of native heart without angina pectoris 06/20/2019   Gastroesophageal reflux disease    Stomach irritation    Abnormal CT scan, esophagus    Polyp of ascending colon    Personal history of tobacco use, presenting hazards to health 01/18/2018   Grade I diastolic dysfunction 12/21/2017   Globus pharyngeus 12/21/2017   Chronic obstructive pulmonary disease (HCC) 12/15/2017   Hyperlipidemia 12/07/2017   Seizures (HCC) 10/01/2016   Grief reaction 04/11/2016   History of breast cancer 12/11/2015   Osteoporosis 11/22/2015   Estrogen deficiency 09/07/2015   Wrinkles 09/07/2015   Post herpetic neuralgia 09/01/2014   Encounter for Medicare annual wellness exam 07/11/2014   Routine general medical examination at a health care facility 06/26/2013   H/O small bowel obstruction 04/15/2013   SOB (shortness of breath) 02/10/2013   Complete heart  block s/p pacemaker (HCC) 11/03/2011   Pacemaker-[employer] 11/03/2011   Lumbar spinal stenosis 09/02/2011   POSTMENOPAUSAL STATUS 08/06/2009   HIDRADENITIS SUPPURATIVA 06/26/2008   HERNIATED DISC 12/14/2007   Hyperkalemia 08/06/2007   History of colonic polyps 09/14/2006   Former smoker 06/10/2006   Asthma 06/10/2006   H/O idiopathic seizure 06/10/2006   Past Medical History:  Diagnosis Date   Allergic rhinitis    Arthritis    Asthma    as a child, mild now   Breast cancer (HCC) 12/2015   right breast cancer, lumpectomy and mammosite    Cardiac pacemaker in situ    Colon polyps    colonoscopy 7/08, tubular adenoma   Complete heart block (HCC) 09/2011   s/p PPM implanted in [facility]   COPD (chronic obstructive pulmonary disease) (HCC)    Myocardial infarction (HCC) 2011   Orthostatic hypotension    Osteopenia 10/2015   Pacemaker    2011   Paroxysmal atrial fibrillation (HCC) 03/2021   Incidentally detected  on pacemaker interrogation   Personal history of radiation therapy 2017   right breast ca, mammosite placed   Rotator cuff tear, right 02/2022   Had an MRI in [place] I have 2 tears.   Seizure disorder (HCC)    Seizures (HCC)    first one was when she was 73 years old    Small bowel obstruction (HCC)    1988 and 2002   Tobacco abuse    Past Surgical History:  Procedure Laterality Date   ABDOMINAL HYSTERECTOMY     BREAST BIOPSY Right 2007   benign inflammatory changes, mass due to underwire bra   BREAST BIOPSY Left 01/02/2016   columnar cell changes without atypical hyperplasia.   BREAST BIOPSY Right 12/06/2015   rt breast mass 10:00, bx done at Dr. Gilberto office, invasive ductal carcinoma   BREAST EXCISIONAL BIOPSY Left 01/02/2016   COLUMNAR CELL CHANGE AND HYPERPLASIA ASSOCIATED WITH LUMINAL AND STROMAL CALCIFICATIONS   BREAST LUMPECTOMY Right 01/02/2016   invasive mammary carcinoma, clear margins, negative LN   BREAST LUMPECTOMY WITH SENTINEL  LYMPH NODE BIOPSY Right 01/02/2016   pT1c, N0; ER/ PR 100%; Her 2 neu not over expressed: BREAST LUMPECTOMY WITH SENTINEL LYMPH NODE BX;  Surgeon: Reyes LELON Cota, MD;  Location: [facility] ORS;  Service: General;  Laterality: Right;   CARDIAC CATHETERIZATION     CAROTID PTA/STENT INTERVENTION Right 04/29/2022   Procedure: CAROTID PTA/STENT INTERVENTION;  Surgeon: Jama Cordella MATSU, MD;  Location: [facility] INVASIVE CV LAB;  Service: Cardiovascular;  Laterality: Right;   CATARACT EXTRACTION Bilateral    COLONOSCOPY  10/2015   Dr Teressa   COLONOSCOPY WITH PROPOFOL  N/A 08/19/2018   Procedure: COLONOSCOPY WITH PROPOFOL ;  Surgeon: Janalyn Keene NOVAK, MD;  Location: [facility] ENDOSCOPY;  Service: Endoscopy;  Laterality: N/A;   ESOPHAGOGASTRODUODENOSCOPY (EGD) WITH PROPOFOL  N/A 08/19/2018   Procedure: ESOPHAGOGASTRODUODENOSCOPY (EGD) WITH PROPOFOL ;  Surgeon: Janalyn Keene NOVAK, MD;  Location: [facility] ENDOSCOPY;  Service: Endoscopy;  Laterality: N/A;   EXPLORATORY LAPAROTOMY  01/25/2001   Exploratory laparotomy, lysis of adhesions, identification of internal hernia secondary to omental adhesion. Prolonged postoperative ileus.   gyn surgery  1993   hysterectomy- form endometriosis   LAPAROSCOPY     PACEMAKER INSERTION  10/24/11   [employer] Advantio dual chamber PPM implanted by Dr Ronny at [name] in [place]   PPM GENERATOR CHANGEOUT N/A 12/05/2019   Procedure: PPM GENERATOR HERMA;  Surgeon: Fernande Elspeth BROCKS, MD;  Location: [facility] INVASIVE CV LAB;  Service: Cardiovascular;  Laterality: N/A;   RIGHT/LEFT HEART CATH AND CORONARY ANGIOGRAPHY Bilateral 07/19/2019   Procedure: RIGHT/LEFT HEART CATH AND CORONARY ANGIOGRAPHY;  Surgeon: Mady Bruckner, MD;  Location: [facility] INVASIVE CV LAB;  Service: Cardiovascular;  Laterality: Bilateral;   TEMPORARY PACEMAKER N/A 12/05/2019   Procedure: TEMPORARY PACEMAKER;  Surgeon: Fernande Elspeth BROCKS, MD;  Location: [facility] INVASIVE CV LAB;  Service:  Cardiovascular;  Laterality: N/A;   Social History   Tobacco Use   Smoking status: Former    Current packs/day: 0.00    Average packs/day: 1.5 packs/day for 42.0 years (63.0 ttl pk-yrs)    Types: Cigarettes    Start date: 11/10/1968    Quit date: 11/11/2010    Years since quitting: 12.9   Smokeless tobacco: Never  Vaping Use   Vaping status: Never Used  Substance Use Topics   Alcohol use: Yes    Alcohol/week: 1.0 - 2.0 standard drink of alcohol    Types: 1 - 2 Glasses of wine  per week    Comment: weekly   Drug use: No   Family History  Problem Relation Age of Onset   Stroke Mother    Heart disease Mother 41       MI and CABG   Dementia Mother    Coronary artery disease Father    Parkinsonism Father    Breast cancer Sister    Cancer - Cervical Daughter 73       died 03/18/23   Heart attack Brother 37   Colon cancer Neg Hx    Allergies  Allergen Reactions   Codeine Nausea And Vomiting   Morphine  And Codeine Nausea Only   Current Outpatient Medications on File Prior to Visit  Medication Sig Dispense Refill   acetaminophen  (TYLENOL ) 500 MG tablet Take 500-1,000 mg by mouth every 6 (six) hours as needed for mild pain or headache.      albuterol  (VENTOLIN  HFA) 108 (90 Base) MCG/ACT inhaler INHALE 2 PUFFS BY MOUTH EVERY 4 HOURS AS NEEDED FOR WHEEZING OR SHORTNESS OF BREATH 18 g 11   apixaban  (ELIQUIS ) 5 MG TABS tablet Take 1 tablet (5 mg total) by mouth 2 (two) times daily.     aspirin  EC 81 MG tablet Take 1 tablet (81 mg total) by mouth daily. 150 tablet 2   atorvastatin  (LIPITOR) 40 MG tablet Take 1 tablet by mouth once daily 90 tablet 2   benzonatate  (TESSALON ) 200 MG capsule Take 1 capsule (200 mg total) by mouth 2 (two) times daily as needed for cough. 20 capsule 0   Cholecalciferol  (VITAMIN D3) 50 MCG (2000 UT) capsule Take 2,000 Units by mouth daily.     fludrocortisone  (FLORINEF ) 0.1 MG tablet Take 2 tablets (0.2 mg total) by mouth daily. 60 tablet 0   FLUoxetine   (PROZAC ) 20 MG capsule Take 1 capsule by mouth once daily 90 capsule 2   gabapentin  (NEURONTIN ) 100 MG capsule Take 2 capsules (200 mg total) by mouth 2 (two) times daily as needed. 60 capsule 0   levETIRAcetam  (KEPPRA ) 250 MG tablet Take 250 mg by mouth 2 (two) times daily.      melatonin 3 MG TABS tablet Take 3 mg by mouth at bedtime.     OXYGEN  Inhale 4 L into the lungs continuous.     pantoprazole  (PROTONIX ) 40 MG tablet Take 1 tablet (40 mg total) by mouth daily. 90 tablet 3   potassium chloride  (KLOR-CON ) 10 MEQ tablet Take 10 mEq by mouth daily.     pyridostigmine  (MESTINON ) 60 MG tablet Take 1 tablet (60 mg total) by mouth 3 (three) times daily. 270 tablet 3   Tiotropium Bromide-Olodaterol (STIOLTO RESPIMAT ) 2.5-2.5 MCG/ACT AERS INHALE 2 PUFFS BY MOUTH ONCE DAILY 12 g 3   No current facility-administered medications on file prior to visit.    Review of Systems  Constitutional:  Positive for fatigue. Negative for activity change, appetite change, fever and unexpected weight change.  HENT:  Negative for congestion, ear pain, rhinorrhea, sinus pressure and sore throat.   Eyes:  Negative for pain, redness and visual disturbance.  Respiratory:  Positive for cough and shortness of breath. Negative for apnea, choking, chest tightness, wheezing and stridor.   Cardiovascular:  Negative for chest pain and palpitations.  Gastrointestinal:  Negative for abdominal pain, blood in stool, constipation and diarrhea.  Endocrine: Negative for polydipsia and polyuria.  Genitourinary:  Negative for dysuria, frequency and urgency.  Musculoskeletal:  Negative for arthralgias, back pain and myalgias.  Skin:  Negative for  pallor and rash.  Allergic/Immunologic: Negative for environmental allergies.  Neurological:  Negative for dizziness, syncope and headaches.  Hematological:  Negative for adenopathy. Does not bruise/bleed easily.  Psychiatric/Behavioral:  Negative for decreased concentration and dysphoric  mood. The patient is not nervous/anxious.        Objective:   Physical Exam Constitutional:      General: She is not in acute distress.    Appearance: Normal appearance. She is well-developed. She is obese. She is not ill-appearing or diaphoretic.     Comments: In wheel chair   HENT:     Head: Normocephalic and atraumatic.     Mouth/Throat:     Mouth: Mucous membranes are moist.  Eyes:     General:        Right eye: No discharge.        Left eye: No discharge.     Conjunctiva/sclera: Conjunctivae normal.     Pupils: Pupils are equal, round, and reactive to light.  Neck:     Thyroid : No thyromegaly.     Vascular: No carotid bruit or JVD.  Cardiovascular:     Rate and Rhythm: Normal rate and regular rhythm.     Heart sounds: Normal heart sounds.     No gallop.  Pulmonary:     Effort: Pulmonary effort is normal. No respiratory distress.     Breath sounds: Normal breath sounds. No stridor. No wheezing, rhonchi or rales.     Comments: Diffusely distant bs No crackles or focal rales   Very harsh bs at bases  Abdominal:     General: There is no distension or abdominal bruit.     Palpations: Abdomen is soft.  Musculoskeletal:     Cervical back: Normal range of motion and neck supple.     Right lower leg: No edema.     Left lower leg: No edema.     Comments: Right knee-brace   Lymphadenopathy:     Cervical: No cervical adenopathy.  Skin:    General: Skin is warm and dry.     Coloration: Skin is not pale.     Findings: No rash.  Neurological:     Mental Status: She is alert.     Coordination: Coordination normal.     Deep Tendon Reflexes: Reflexes are normal and symmetric. Reflexes normal.  Psychiatric:        Mood and Affect: Mood normal.           Assessment & Plan:   Problem List Items Addressed This Visit       Cardiovascular and Mediastinum   Orthostatic hypotension - Primary   Last hospitalization was 09/2023 Reviewed hospital records, lab results and  studies in detail  Also reviewed cardiology visit  Encouraged to continue compression garments Also Continue 0.2mg  florinef  BID, 10 mg midodrine  TID, and 60mg  pyridostigmine   Pt has to pick up the midodrine  today and start back on it  BP: (!) 106/58  Continue to monitor       Relevant Medications   midodrine  (PROAMATINE ) 10 MG tablet     Respiratory   Chronic respiratory failure with hypoxia (HCC)   Worse hypoxia despite 5 liters of 02  At home cannot get it over 90 Here today 86%  Not shortness of breath with speech  C/o dry cough since early August   On xray new ovoid opacity in LLL possible pna vs atelectasis Will plan follow up with pulmonary  Also treat with antibiotic for possible pna  Relevant Orders   Ambulatory referral to Pulmonology   DG Chest 2 View (Completed)   Chronic obstructive pulmonary disease (HCC)   Per pt more shortness of breath recently and struggling to get pulse ox over 90 at home  Here 86% on 5L of 02  Needs follow up with pulmonary-referral done         Other   Syncope and collapse   No more episodes since taking 3 med regimen for orthostatic hypotension  Continue 0.2mg  florinef  BID, 10mg  midodrine  TID, and 60mg  pyridostigmine       Persistent cough   Since early aug Dry  Also hypoxia worsening with copd  On exam bs are distant  Cxr noted new ovoid opacity in LLL -cannot r/o pneumonia (vs atelectasis)  Also notes increased atelectasis in right mid lung  Will treat with augmentin   Get back to pulmonary   Call back and Er precautions noted in detail today        Relevant Orders   DG Chest 2 View (Completed)   Anemia   In the past mild anemia of chronic disease  Hb was down to 8.8 during hosp last month  Feels tired   Bmet and cbc with iron levels today      Relevant Orders   Iron   Ferritin   CBC with Differential/Platelet   Basic metabolic panel with GFR

## 2023-10-29 NOTE — Assessment & Plan Note (Signed)
 Since early aug Dry  Also hypoxia worsening with copd  On exam bs are distant  Cxr noted new ovoid opacity in LLL -cannot r/o pneumonia (vs atelectasis)  Also notes increased atelectasis in right mid lung  Will treat with augmentin   Get back to pulmonary   Call back and Er precautions noted in detail today

## 2023-10-29 NOTE — Assessment & Plan Note (Signed)
 In the past mild anemia of chronic disease  Hb was down to 8.8 during hosp last month  Feels tired   Bmet and cbc with iron levels today

## 2023-10-29 NOTE — Patient Instructions (Signed)
 Chest xray today  We will reach out with results    Lab for  Anemia  Kidney   Follow up with pulmonary-give them a call  I sent a referral   See Dr Cleotilde as planned   Pick up your midodrine   Stay on all 3 medicines to keep blood pressure stable

## 2023-10-29 NOTE — Assessment & Plan Note (Signed)
 Worse hypoxia despite 5 liters of 02  At home cannot get it over 90 Here today 86%  Not shortness of breath with speech  C/o dry cough since early August   On xray new ovoid opacity in LLL possible pna vs atelectasis Will plan follow up with pulmonary  Also treat with antibiotic for possible pna

## 2023-10-29 NOTE — Assessment & Plan Note (Signed)
 Per pt more shortness of breath recently and struggling to get pulse ox over 90 at home  Here 86% on 5L of 02  Needs follow up with pulmonary-referral done

## 2023-10-30 ENCOUNTER — Encounter: Payer: Self-pay | Admitting: Pulmonary Disease

## 2023-10-30 ENCOUNTER — Other Ambulatory Visit: Payer: Self-pay

## 2023-10-30 ENCOUNTER — Ambulatory Visit: Admitting: Pulmonary Disease

## 2023-10-30 VITALS — BP 166/80 | HR 89 | Temp 98.1°F | Ht 69.0 in | Wt 205.0 lb

## 2023-10-30 DIAGNOSIS — J9611 Chronic respiratory failure with hypoxia: Secondary | ICD-10-CM | POA: Diagnosis not present

## 2023-10-30 DIAGNOSIS — J189 Pneumonia, unspecified organism: Secondary | ICD-10-CM | POA: Diagnosis not present

## 2023-10-30 DIAGNOSIS — J441 Chronic obstructive pulmonary disease with (acute) exacerbation: Secondary | ICD-10-CM | POA: Diagnosis not present

## 2023-10-30 MED ORDER — IPRATROPIUM-ALBUTEROL 0.5-2.5 (3) MG/3ML IN SOLN
3.0000 mL | Freq: Once | RESPIRATORY_TRACT | Status: AC
  Administered 2023-10-30: 3 mL via RESPIRATORY_TRACT

## 2023-10-30 MED ORDER — IPRATROPIUM-ALBUTEROL 0.5-2.5 (3) MG/3ML IN SOLN
3.0000 mL | Freq: Four times a day (QID) | RESPIRATORY_TRACT | 3 refills | Status: AC | PRN
  Filled 2023-10-30: qty 360, 30d supply, fill #0

## 2023-10-30 MED ORDER — COMPRESSOR/NEBULIZER MISC
0 refills | Status: AC
  Filled 2023-10-30: qty 1, 30d supply, fill #0

## 2023-10-30 NOTE — Patient Instructions (Signed)
 VISIT SUMMARY:  Today, we discussed your severe emphysema, recent pneumonia, and a recent fall. We reviewed your current treatments and made some changes to help improve your condition. We also talked about ways to prevent future falls and improve your mobility.  YOUR PLAN:  -SEVERE EMPHYSEMA WITH CHRONIC RESPIRATORY FAILURE AND HYPOXIA: Severe emphysema is a long-term lung condition that causes difficulty breathing and low oxygen  levels. We are switching you from inhalers to a nebulizer to help deliver your medication more effectively. You should use the nebulizer every six hours as needed, aiming for four times a day. We will order the nebulizer and medication from [facility] pharmacy.  -LEFT LUNG PNEUMONIA: Pneumonia is an infection in the lungs. You are currently on antibiotics and coughing up clear phlegm, which is a good sign. We will order a follow-up chest x-ray in a few weeks to make sure the pneumonia is resolving.  -RECENT FALL WITH FUNCTIONAL DECLINE: Your recent fall was likely due to weakness in your legs, which has affected your mobility. To prevent future falls, we discussed the importance of using assistive devices and maintaining a proper stance.  INSTRUCTIONS:  Please follow up with a chest x-ray in a few weeks to check on your pneumonia. Use the nebulizer every six hours as needed, aiming for four times a day. Make sure to use assistive devices and maintain a proper stance to prevent falls.

## 2023-10-30 NOTE — Progress Notes (Signed)
 Subjective:    Patient ID: Diana Cook, female    DOB: 1950/06/17, 73 y.o.   MRN: [id number]  Patient Care Team: Tower, Laine LABOR, MD as PCP - General (Family Medicine) End, Lonni, MD as PCP - Cardiology (Cardiology) Cindie Ole DASEN, MD as PCP - Electrophysiology (Clinical Cardiac Electrophysiology) Carolee Manus DASEN., MD as Referring Physician (Ophthalmology) Tower, Laine LABOR, MD as Consulting Physician (Family Medicine) Riddle, Suzann, NP as Nurse Practitioner (Clinical Cardiac Electrophysiology)  Chief Complaint  Patient presents with   centrilobular emphysema    Patient using 5L of oxygen .     BACKGROUND/INTERVAL: Patient is a 73 year old former smoker (42 PY) who follows here for the issue of dyspnea on exertion in the setting of emphysema with preserved FEV1.  She has chronic respiratory failure with hypoxia related to emphysema.  She has not been seen since 19 November 2022  HPI Discussed the use of AI scribe software for clinical note transcription with the patient, who gave verbal consent to proceed.  History of Present Illness   Diana Cook is a 73 year old female with severe emphysema and chronic respiratory failure who presents with a recent left lung infiltrate consistent with pneumonia. She is accompanied by her sister, Iris Forget.  She has severe emphysema with chronic respiratory failure, typically maintaining oxygen  saturation levels on room air around 88%, but recently noted a drop to 81%. She does not have a nebulizer at home but uses inhalers, including a rescue inhaler and other sprays for her respiratory condition.  She was recently diagnosed (yesterday) with a left lung infiltrate consistent with pneumonia and is currently on antibiotics. She coughs up clear phlegm. No fever or chills. She experiences feeling cold when others are warm, but eventually feels hot when the heat is turned up.  She has a history of syncope, which ceased after a recent  two-week hospital stay followed by a transfer to [facility] rehab. She has not experienced any episodes since. However, she fell yesterday while trying to get off the toilet, attributing it to weakness in her legs.  She requires a knee replacement and reports that she has been told she is a high risk for surgery due to her current health status. She wants to walk again and has inquired about the possibility of a spinal block for the procedure.     She has not had any fevers, chills or sweats.  She has not been seen since September last year.   Review of Systems A 10 point review of systems was performed and it is as noted above otherwise negative.   Patient Active Problem List   Diagnosis Date Noted   Iron  deficiency 11/02/2023   Syncope and collapse 09/26/2023   Hypokalemia 09/26/2023   Carotid stenosis s/p stents 09/26/2023   Chronic anticoagulation 09/26/2023   Physical deconditioning 09/18/2023   Syncope 08/29/2023   Orthostatic hypotension 08/28/2023   Decreased GFR 04/29/2023   Presence of heart assist device (HCC) 03/25/2023   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/25/2023   Persistent cough 03/03/2023   Urinary urgency 02/09/2023   Carotid stenosis, symptomatic w/o infarct 03/23/2022   Right shoulder pain 11/25/2021   Anemia 11/03/2021   Thyroid  nodule 09/17/2021   Dyslipidemia 09/05/2021   Depression 09/05/2021   GERD without esophagitis 09/05/2021   Vitamin D  deficiency 08/20/2021   Colon cancer screening 07/30/2021   Hypercalcemia 07/29/2021   UTI (urinary tract infection) 07/17/2021   Dysuria 07/08/2021   Arterial  hypotension 07/08/2021   Paroxysmal atrial fibrillation (HCC) 04/11/2021   Current use of proton pump inhibitor 10/24/2020   Sleep apnea 10/24/2020   Fatigue 10/24/2020   Lightheadedness 05/16/2020   Radicular pain in left arm 03/02/2020   Neck pain on left side 02/15/2020   Paresthesia 02/15/2020   Chronic respiratory failure with hypoxia  (HCC) 11/17/2019   Chronic heart failure with preserved ejection fraction (HFpEF) (HCC) 08/11/2019   Coronary artery disease involving native coronary artery of native heart without angina pectoris 06/20/2019   Gastroesophageal reflux disease    Stomach irritation    Abnormal CT scan, esophagus    Polyp of ascending colon    Personal history of tobacco use, presenting hazards to health 01/18/2018   Grade I diastolic dysfunction 12/21/2017   Globus pharyngeus 12/21/2017   Chronic obstructive pulmonary disease (HCC) 12/15/2017   Hyperlipidemia 12/07/2017   Seizures (HCC) 10/01/2016   Grief reaction 04/11/2016   History of breast cancer 12/11/2015   Osteoporosis 11/22/2015   Estrogen deficiency 09/07/2015   Wrinkles 09/07/2015   Post herpetic neuralgia 09/01/2014   Encounter for Medicare annual wellness exam 07/11/2014   Routine general medical examination at a health care facility 06/26/2013   H/O small bowel obstruction 04/15/2013   SOB (shortness of breath) 02/10/2013   Complete heart block s/p pacemaker (HCC) 11/03/2011   Pacemaker-[employer] 11/03/2011   Lumbar spinal stenosis 09/02/2011   POSTMENOPAUSAL STATUS 08/06/2009   HIDRADENITIS SUPPURATIVA 06/26/2008   HERNIATED DISC 12/14/2007   Hyperkalemia 08/06/2007   History of colonic polyps 09/14/2006   Former smoker 06/10/2006   Asthma 06/10/2006   H/O idiopathic seizure 06/10/2006    Social History   Tobacco Use   Smoking status: Former    Current packs/day: 0.00    Average packs/day: 1.5 packs/day for 42.0 years (63.0 ttl pk-yrs)    Types: Cigarettes    Start date: 11/10/1968    Quit date: 11/11/2010    Years since quitting: 13.0   Smokeless tobacco: Never  Substance Use Topics   Alcohol use: Yes    Alcohol/week: 1.0 - 2.0 standard drink of alcohol    Types: 1 - 2 Glasses of wine per week    Comment: weekly    Allergies  Allergen Reactions   Codeine Nausea And Vomiting   Morphine  And Codeine Nausea  Only    Current Meds  Medication Sig   acetaminophen  (TYLENOL ) 500 MG tablet Take 500-1,000 mg by mouth every 6 (six) hours as needed for mild pain or headache.    albuterol  (VENTOLIN  HFA) 108 (90 Base) MCG/ACT inhaler INHALE 2 PUFFS BY MOUTH EVERY 4 HOURS AS NEEDED FOR WHEEZING OR SHORTNESS OF BREATH   amoxicillin -clavulanate (AUGMENTIN ) 875-125 MG tablet Take 1 tablet by mouth 2 (two) times daily.   apixaban  (ELIQUIS ) 5 MG TABS tablet Take 1 tablet (5 mg total) by mouth 2 (two) times daily.   aspirin  EC 81 MG tablet Take 1 tablet (81 mg total) by mouth daily.   atorvastatin  (LIPITOR) 40 MG tablet Take 1 tablet by mouth once daily   benzonatate  (TESSALON ) 200 MG capsule Take 1 capsule (200 mg total) by mouth 2 (two) times daily as needed for cough.   Cholecalciferol  (VITAMIN D3) 50 MCG (2000 UT) capsule Take 2,000 Units by mouth daily.   FLUoxetine  (PROZAC ) 20 MG capsule Take 1 capsule by mouth once daily   gabapentin  (NEURONTIN ) 100 MG capsule Take 2 capsules (200 mg total) by mouth 2 (two) times daily as needed.  ipratropium-albuterol  (DUONEB) 0.5-2.5 (3) MG/3ML SOLN Take 3 mLs by nebulization every 6 (six) hours as needed.   levETIRAcetam  (KEPPRA ) 250 MG tablet Take 250 mg by mouth 2 (two) times daily.    midodrine  (PROAMATINE ) 10 MG tablet Take 1 tablet (10 mg total) by mouth 3 (three) times daily with meals.   Nebulizers (COMPRESSOR/NEBULIZER) MISC For use with medication for the lungs.  Use as directed.   OXYGEN  Inhale 4 L into the lungs continuous.   pantoprazole  (PROTONIX ) 40 MG tablet Take 1 tablet (40 mg total) by mouth daily.   potassium chloride  (KLOR-CON ) 10 MEQ tablet Take 3 tablets (30 mEq total) by mouth daily.   pyridostigmine  (MESTINON ) 60 MG tablet Take 1 tablet (60 mg total) by mouth 3 (three) times daily.   [DISCONTINUED] Tiotropium Bromide-Olodaterol (STIOLTO RESPIMAT ) 2.5-2.5 MCG/ACT AERS INHALE 2 PUFFS BY MOUTH ONCE DAILY    Immunization History  Administered  Date(s) Administered    sv, Bivalent, Protein Subunit Rsvpref,pf (Abrysvo) 12/31/2022   Fluad Quad(high Dose 65+) 10/24/2020   INFLUENZA, HIGH DOSE SEASONAL PF 11/04/2018, 11/22/2019, 12/31/2022   Influenza Whole 02/24/2005   Influenza,inj,Quad PF,6+ Mos 12/03/2012, 12/09/2013, 01/16/2015, 02/06/2016, 11/04/2016, 12/08/2017   Influenza-Unspecified 10/25/2021   PFIZER Comirnaty(Gray Top)Covid-19 Tri-Sucrose Vaccine 07/06/2020   PFIZER(Purple Top)SARS-COV-2 Vaccination 02/28/2019, 03/21/2019, 11/14/2019   PNEUMOCOCCAL CONJUGATE-20 07/06/2020   Pneumococcal Conjugate-13 07/11/2014   Pneumococcal Polysaccharide-23 03/21/2005, 07/26/2015   Td 01/09/1999, 06/14/2007   Zoster, Live 09/07/2015        Objective:     BP (!) 166/80   Pulse 89   Temp 98.1 F (36.7 C) (Oral)   Ht 5' 9 (1.753 m)   Wt 205 lb (93 kg)   SpO2 96%   BMI 30.27 kg/m   SpO2: 96 % on 3 L/min nasal cannula O2 via POC after nebulizer treatment.  GENERAL: Well-developed well-nourished woman in no acute respiratory distress.  She is fully ambulatory.  No conversational dyspnea.  No hoarseness noted today. HEAD: Normocephalic, atraumatic. EYES: Pupils equal, round, reactive to light.  No scleral icterus. MOUTH: Tissue intact.  No thrush. NECK: Supple. No thyromegaly. No nodules. No JVD.  Trachea midline. PULMONARY: Symmetrical air entry, some scattered rhonchi throughout. CARDIOVASCULAR: S1 and S2. Regular rate and rhythm. No rubs murmurs gallops heard. GASTROINTESTINAL: Nondistended. MUSCULOSKELETAL: No joint deformity, no clubbing, no edema. NEUROLOGIC: Awake, alert, oriented x4, no focal deficits. SKIN: Intact,warm,dry, on limited exam no rashes. PSYCH: Normal mood and behavior.  Chest x-ray performed 29 October 2023 showing new opacity in the left lower lobe which may represent acute infiltrate.  There is platelike density in the right midlung from prior radiation therapy:    Patient received nebulization  treatment with DuoNeb x 1: Rhonchi cleared, no wheezes.  Oxygen  saturations went from 91% on 3 L/min O2 to 96% on 3 L/min O2.  Assessment & Plan:     ICD-10-CM   1. Chronic respiratory failure with hypoxia (HCC)  J96.11 ipratropium-albuterol  (DUONEB) 0.5-2.5 (3) MG/3ML nebulizer solution 3 mL    2. COPD with acute exacerbation (HCC)  J44.1 ipratropium-albuterol  (DUONEB) 0.5-2.5 (3) MG/3ML nebulizer solution 3 mL    3. Community acquired pneumonia of left lung, unspecified part of lung  J18.9 ipratropium-albuterol  (DUONEB) 0.5-2.5 (3) MG/3ML nebulizer solution 3 mL     Orders Placed This Encounter  Procedures   DG Chest 2 View    Standing Status:   Future    Expected Date:   11/26/2023    Expiration Date:   11/16/2024  Reason for Exam (SYMPTOM  OR DIAGNOSIS REQUIRED):   Pneumonia    Preferred imaging location?:   [facility]   Meds ordered this encounter  Medications   Nebulizers (COMPRESSOR/NEBULIZER) MISC    Sig: For use with medication for the lungs.  Use as directed.    Dispense:  1 each    Refill:  0   ipratropium-albuterol  (DUONEB) 0.5-2.5 (3) MG/3ML SOLN    Sig: Take 3 mLs by nebulization every 6 (six) hours as needed.    Dispense:  360 mL    Refill:  3    Medicare Part B   ipratropium-albuterol  (DUONEB) 0.5-2.5 (3) MG/3ML nebulizer solution 3 mL    Discussion:    Severe emphysema with chronic respiratory failure and hypoxia Oxygen  saturation at 91-92%, improved to 94-96% with nebulizer treatment. Current inhalers are inadequate. No home nebulizer available. - Switch from inhalers to nebulizers for improved medication delivery. - Order nebulizer and medication from [facility] pharmacy. - Instruct to use nebulizer every six hours as needed, aiming for four times a day.  Left lung pneumonia Recent left lung infiltrate consistent with pneumonia. On antibiotics. Coughing up clear phlegm, no fever or chills. - Order follow-up chest x-ray in a few weeks to assess  resolution.  Recent fall with functional decline Fall in bathroom likely due to weakness, resulting in functional decline and mobility difficulty. Risk of further falls. - Educate on proper stance and use of assistive devices to prevent falls.     Will see the patient in 2 to 3 weeks time she is to contact us  prior to that time should any new difficulties arise.   Advised if symptoms do not improve or worsen, to please contact office for sooner follow up or seek emergency care.    I spent 48 minutes of dedicated to the care of this patient on the date of this encounter to include pre-visit review of records, face-to-face time with the patient discussing conditions above, post visit ordering of testing, clinical documentation with the electronic health record, making appropriate referrals as documented, and communicating necessary findings to members of the patients care team.     C. Leita Sanders, MD Advanced Bronchoscopy PCCM [facility]    *This note was generated using voice recognition software/Dragon and/or AI transcription program.  Despite best efforts to proofread, errors can occur which can change the meaning. Any transcriptional errors that result from this process are unintentional and may not be fully corrected at the time of dictation.

## 2023-10-31 ENCOUNTER — Ambulatory Visit: Payer: Self-pay | Admitting: Cardiology

## 2023-11-02 DIAGNOSIS — E611 Iron deficiency: Secondary | ICD-10-CM | POA: Insufficient documentation

## 2023-11-02 MED ORDER — POLYSACCHARIDE IRON COMPLEX 150 MG PO CAPS: 150.0000 mg | ORAL_CAPSULE | Freq: Every day | ORAL | 0 refills | Status: DC

## 2023-11-05 ENCOUNTER — Ambulatory Visit: Attending: Medical | Admitting: Medical

## 2023-11-05 ENCOUNTER — Encounter: Payer: Self-pay | Admitting: Medical

## 2023-11-05 VITALS — BP 112/62 | HR 79 | Ht 69.0 in | Wt 201.4 lb

## 2023-11-05 DIAGNOSIS — E876 Hypokalemia: Secondary | ICD-10-CM

## 2023-11-05 DIAGNOSIS — I6523 Occlusion and stenosis of bilateral carotid arteries: Secondary | ICD-10-CM

## 2023-11-05 DIAGNOSIS — I951 Orthostatic hypotension: Secondary | ICD-10-CM | POA: Diagnosis not present

## 2023-11-05 DIAGNOSIS — Z950 Presence of cardiac pacemaker: Secondary | ICD-10-CM

## 2023-11-05 DIAGNOSIS — I480 Paroxysmal atrial fibrillation: Secondary | ICD-10-CM | POA: Diagnosis not present

## 2023-11-05 DIAGNOSIS — I2510 Atherosclerotic heart disease of native coronary artery without angina pectoris: Secondary | ICD-10-CM

## 2023-11-05 DIAGNOSIS — I442 Atrioventricular block, complete: Secondary | ICD-10-CM

## 2023-11-05 NOTE — Patient Instructions (Signed)
 Medication Instructions:  Your physician recommends that you continue on your current medications as directed. Please refer to the Current Medication list given to you today.    *If you need a refill on your cardiac medications before your next appointment, please call your pharmacy*  Lab Work: No labs ordered today    Testing/Procedures: No test ordered today   Follow-Up: At [facility], you and your health needs are our priority.  As part of our continuing mission to provide you with exceptional heart care, our providers are all part of one team.  This team includes your primary Cardiologist (physician) and Advanced Practice Providers or APPs (Physician Assistants and Nurse Practitioners) who all work together to provide you with the care you need, when you need it.  Your next appointment:   4 month(s)  Provider:   You may see Lonni Hanson, MD or one of the following Advanced Practice Providers on your designated Care Team:   Lonni Meager, NP Lesley Maffucci, PA-C Bernardino Bring, PA-C Cadence Lawrence, PA-C Tylene Lunch, NP Barnie Hila, NP

## 2023-11-05 NOTE — Progress Notes (Signed)
 Cardiology Office Note   Date:  11/05/2023  ID:  DEZIYAH ARVIN, DOB [birth year], MRN [id number] PCP: Randeen Laine LABOR, MD  Diana Cook Cardiologist:  Lonni Hanson, MD Electrophysiologist:  OLE ONEIDA HOLTS, MD  Electrophysiology APP:  Riddle, Suzann, NP   History of Present Illness Yaiza Palazzola Rengel is a 73 y.o. female with a history of moderate coronary artery disease by cardiac CTA in 11/2018 and cardiac cath in 06/2019, carotid artery stenosis status post right carotid stenting 04/2022, complete heart block status post permanent pacemaker followed by EP, paroxysmal A-fib, seizure disorder, COPD on 4LO2, and right breast cancer who presents for follow-up of CAD and atrial fibrillation.   Patient was seen 06/2023 reporting weight gain, fatigue, intermittent confusion.  She felt it was Eliquis , but after discussion it was decided symptoms started before medication. Echo showed LVEF 55-60%, no WMA, G1DD.    The patient was admitted early July 2025 with syncope from orthostatic hypotension, UTI, hypokalemia, hypomagnesemia. Patient had diarrhea for the week beforehand. Cortisol elvels were normal. Patient was started on midodrine  and florinef  and given IVF. It was later felt Florinef  did not help and it was discontinued. Patient was discharged on midodrine  10mg  TID and recommended wearing abdominal binder, TED hose and increased oral hydration.   She was seen in the ER 09/14/23 for orthostatic hypotension and syncope. She was restarted on Florinef .  Patient was admitted in August 2025 with orthostatic hypotension and syncope.  She was started on Mestinon  60mg  3 times daily.  She was continued on midodrine  and Florinef .  Pacemaker was working normally.  Today, the patient was in rehab for 3 weeks after the hospitalization. She has been really tired at home. She was diagnosed with PNA last week and is on antibiotics. I suspect this is greatly contributing to low energy. She has not had  issues with the blood pressure or passing out. PT/OT have been coming out daily. She denies and no worsening SOB. She is on 4L O2.   Studies Reviewed EKG Interpretation Date/Time:  Thursday November 05 2023 14:32:11 EDT Ventricular Rate:  79 PR Interval:  172 QRS Duration:  168 QT Interval:  478 QTC Calculation: 548 R Axis:   -73  Text Interpretation: Atrial-sensed ventricular-paced rhythm When compared with ECG of 25-Sep-2023 23:07, PREVIOUS ECG IS PRESENT Confirmed by Franchester, Darion Juhasz ([id number]) on 11/05/2023 2:38:23 PM    Echo 07/2023 1. Left ventricular ejection fraction, by estimation, is 55 to 60%. Left  ventricular ejection fraction by 2D MOD biplane is 56.1 %. The left  ventricle has normal function. The left ventricle has no regional wall  motion abnormalities. Left ventricular  diastolic parameters are consistent with Grade I diastolic dysfunction  (impaired relaxation).   2. Right ventricular systolic function is normal. The right ventricular  size is normal.   3. The mitral valve is normal in structure. Trivial mitral valve  regurgitation.   4. The aortic valve is tricuspid. Aortic valve regurgitation is not  visualized.    Carotid US  05/2023 Vertebrals:  Bilateral vertebral arteries demonstrate antegrade flow.  Subclavians: Normal flow hemodynamics were seen in bilateral subclavian               arteries.   *See table(s) above for measurements and observations.       Physical Exam VS:  BP 112/62   Pulse 79   Ht 5' 9 (1.753 m)   Wt 201 lb 6.4 oz (91.4 kg)   SpO2 (!) 87%  BMI 29.74 kg/m        Wt Readings from Last 3 Encounters:  11/05/23 201 lb 6.4 oz (91.4 kg)  10/30/23 205 lb (93 kg)  10/27/23 210 lb (95.3 kg)    GEN: Well nourished, well developed in no acute distress NECK: No JVD; No carotid bruits CARDIAC: RRR, no murmurs, rubs, gallops RESPIRATORY:  Clear to auscultation without rales, wheezing or rhonchi  ABDOMEN: Soft, non-tender,  non-distended EXTREMITIES:  No edema; No deformity   ASSESSMENT AND PLAN  Orthostatic hypotension Recent hospitalization for syncope/orthostatic hypotension. She was started on Pyridostigmine  60mg  TID. She denies further pre-syncope or syncope. Blood pressure has been good at home. She reports low energy which is likely from the PNA. Continue midodrine  10mg  TID, Pyridostigmine  60mg  TID, and Florinef  0.1 mg BID.   CAD Prior cardiac CTA and subsequent catheterization showed moderate nonobstructive CAD. Continue Lipitor. She is on both ASA and Eliquis , preference per [facility].   Carotid artery stenosis S/p right carotid stent. Recent US  showed normal blood flow. Continue ASA and Eliquis  per [facility].   Paroxysmal Afib No Afib seen on device. Continue Eliquis  5mg  BID.   CHB s/p PPM Normally functioning device. She is followed by EP.   Hypokalemia K 2.9 in the hospital. PCP ordered repeat labs.        Dispo: Follow-up in 4 months  Signed, Fayetta Sorenson VEAR Fishman, PA-C

## 2023-11-06 ENCOUNTER — Other Ambulatory Visit (INDEPENDENT_AMBULATORY_CARE_PROVIDER_SITE_OTHER)

## 2023-11-06 ENCOUNTER — Ambulatory Visit: Payer: Self-pay | Admitting: Family Medicine

## 2023-11-06 DIAGNOSIS — E876 Hypokalemia: Secondary | ICD-10-CM | POA: Diagnosis not present

## 2023-11-06 DIAGNOSIS — E611 Iron deficiency: Secondary | ICD-10-CM

## 2023-11-06 DIAGNOSIS — D509 Iron deficiency anemia, unspecified: Secondary | ICD-10-CM

## 2023-11-06 LAB — CBC WITH DIFFERENTIAL/PLATELET
Basophils Absolute: 0 K/uL (ref 0.0–0.1)
Basophils Relative: 0.5 % (ref 0.0–3.0)
Eosinophils Absolute: 0.3 K/uL (ref 0.0–0.7)
Eosinophils Relative: 3.2 % (ref 0.0–5.0)
HCT: 33.2 % — ABNORMAL LOW (ref 36.0–46.0)
Hemoglobin: 10.8 g/dL — ABNORMAL LOW (ref 12.0–15.0)
Lymphocytes Relative: 9.2 % — ABNORMAL LOW (ref 12.0–46.0)
Lymphs Abs: 0.8 K/uL (ref 0.7–4.0)
MCHC: 32.5 g/dL (ref 30.0–36.0)
MCV: 84.1 fl (ref 78.0–100.0)
Monocytes Absolute: 0.8 K/uL (ref 0.1–1.0)
Monocytes Relative: 9.8 % (ref 3.0–12.0)
Neutro Abs: 6.4 K/uL (ref 1.4–7.7)
Neutrophils Relative %: 77.3 % — ABNORMAL HIGH (ref 43.0–77.0)
Platelets: 397 K/uL (ref 150.0–400.0)
RBC: 3.94 Mil/uL (ref 3.87–5.11)
RDW: 16.9 % — ABNORMAL HIGH (ref 11.5–15.5)
WBC: 8.2 K/uL (ref 4.0–10.5)

## 2023-11-06 LAB — BASIC METABOLIC PANEL WITH GFR
BUN: 10 mg/dL (ref 6–23)
CO2: 27 meq/L (ref 19–32)
Calcium: 9.1 mg/dL (ref 8.4–10.5)
Chloride: 99 meq/L (ref 96–112)
Creatinine, Ser: 0.94 mg/dL (ref 0.40–1.20)
GFR: 59.45 mL/min — ABNORMAL LOW (ref >60.00)
Glucose, Bld: 91 mg/dL (ref 70–99)
Potassium: 3.6 meq/L (ref 3.5–5.1)
Sodium: 138 meq/L (ref 135–145)

## 2023-11-06 LAB — FERRITIN: Ferritin: 90.4 ng/mL (ref 10.0–291.0)

## 2023-11-06 LAB — IRON: Iron: 19 ug/dL — ABNORMAL LOW (ref 42–145)

## 2023-11-11 ENCOUNTER — Encounter: Admitting: Cardiology

## 2023-11-12 DIAGNOSIS — K219 Gastro-esophageal reflux disease without esophagitis: Secondary | ICD-10-CM

## 2023-11-12 DIAGNOSIS — I480 Paroxysmal atrial fibrillation: Secondary | ICD-10-CM

## 2023-11-12 DIAGNOSIS — I2510 Atherosclerotic heart disease of native coronary artery without angina pectoris: Secondary | ICD-10-CM

## 2023-11-12 DIAGNOSIS — I442 Atrioventricular block, complete: Secondary | ICD-10-CM

## 2023-11-12 DIAGNOSIS — I252 Old myocardial infarction: Secondary | ICD-10-CM

## 2023-11-12 DIAGNOSIS — J9611 Chronic respiratory failure with hypoxia: Secondary | ICD-10-CM | POA: Diagnosis not present

## 2023-11-12 DIAGNOSIS — I5032 Chronic diastolic (congestive) heart failure: Secondary | ICD-10-CM | POA: Diagnosis not present

## 2023-11-12 DIAGNOSIS — J4489 Other specified chronic obstructive pulmonary disease: Secondary | ICD-10-CM

## 2023-11-12 DIAGNOSIS — S93401D Sprain of unspecified ligament of right ankle, subsequent encounter: Secondary | ICD-10-CM | POA: Diagnosis not present

## 2023-11-12 DIAGNOSIS — H9193 Unspecified hearing loss, bilateral: Secondary | ICD-10-CM

## 2023-11-12 DIAGNOSIS — G40909 Epilepsy, unspecified, not intractable, without status epilepticus: Secondary | ICD-10-CM | POA: Diagnosis not present

## 2023-11-12 DIAGNOSIS — E785 Hyperlipidemia, unspecified: Secondary | ICD-10-CM

## 2023-11-16 ENCOUNTER — Ambulatory Visit: Payer: Self-pay

## 2023-11-16 NOTE — Telephone Encounter (Signed)
  FYI Only or Action Required?: Action required by provider: request for appointment.  Patient was last seen in primary care on 10/29/2023 by Randeen Laine LABOR, MD.  Called Nurse Triage reporting Fall.  Symptoms began today.  Interventions attempted: Nothing.  Symptoms are: unchanged.Clemens out of bed and hit head, small scalp laceration. Declines ED. Called the practice and they recommend ED as well.  Triage Disposition: Go to ED Now (or PCP Triage)  Patient/caregiver understands and will follow disposition?: Yes  Copied from CRM [id number]. Topic: Clinical - Red Word Triage >> Nov 16, 2023  9:44 AM Mesmerise C wrote: [facility] that prompted transfer to Nurse Triage: Grayce from [employer] is with patient and states patient had a fall out of the bed and has a laceration not bleeding currently, her bp was low this morning was 90/60 Reason for Disposition  Patient sounds very sick or weak to the triager  Answer Assessment - Initial Assessment Questions 1. MECHANISM: How did the fall happen?     Clemens out of bed 2. DOMESTIC VIOLENCE AND ELDER ABUSE SCREENING: Did you fall because someone pushed you or tried to hurt you? If Yes, ask: Are you safe now?     no 3. ONSET: When did the fall happen? (e.g., minutes, hours, or days ago)     This morning 4. LOCATION: What part of the body hit the ground? (e.g., back, buttocks, head, hips, knees, hands, head, stomach)     side 5. INJURY: Did you hurt (injure) yourself when you fell? If Yes, ask: What did you injure? Tell me more about this? (e.g., body area; type of injury; pain severity)     Hit head 6. PAIN: Is there any pain? If Yes, ask: How bad is the pain? (e.g., Scale 0-10; or none, mild,      none 7. SIZE: For cuts, bruises, or swelling, ask: How large is it? (e.g., inches or centimeters)      Small scalp 8. PREGNANCY: Is there any chance you are pregnant? When was your last menstrual period?     no 9. OTHER  SYMPTOMS: Do you have any other symptoms? (e.g., dizziness, fever, weakness; new-onset or worsening).      no 10. CAUSE: What do you think caused the fall (or falling)? (e.g., dizzy spell, tripped)       Clemens out of bed  Protocols used: Falls and [facility]

## 2023-11-16 NOTE — Telephone Encounter (Signed)
 Agree with ED disposition Aware, will watch for correspondence

## 2023-11-17 ENCOUNTER — Encounter: Payer: Self-pay | Admitting: Pulmonary Disease

## 2023-11-18 ENCOUNTER — Ambulatory Visit: Payer: Self-pay

## 2023-11-18 ENCOUNTER — Other Ambulatory Visit: Payer: Self-pay

## 2023-11-18 ENCOUNTER — Emergency Department
Admission: EM | Admit: 2023-11-18 | Discharge: 2023-11-18 | Disposition: A | Discharge status: Home / Self Care | Attending: Emergency Medicine | Admitting: Emergency Medicine

## 2023-11-18 ENCOUNTER — Encounter: Payer: Self-pay | Admitting: Emergency Medicine

## 2023-11-18 DIAGNOSIS — R350 Frequency of micturition: Secondary | ICD-10-CM | POA: Insufficient documentation

## 2023-11-18 DIAGNOSIS — R55 Syncope and collapse: Secondary | ICD-10-CM | POA: Diagnosis present

## 2023-11-18 DIAGNOSIS — Z7901 Long term (current) use of anticoagulants: Secondary | ICD-10-CM | POA: Diagnosis not present

## 2023-11-18 DIAGNOSIS — I951 Orthostatic hypotension: Secondary | ICD-10-CM | POA: Insufficient documentation

## 2023-11-18 DIAGNOSIS — R3915 Urgency of urination: Secondary | ICD-10-CM | POA: Insufficient documentation

## 2023-11-18 DIAGNOSIS — J449 Chronic obstructive pulmonary disease, unspecified: Secondary | ICD-10-CM | POA: Diagnosis not present

## 2023-11-18 DIAGNOSIS — N3000 Acute cystitis without hematuria: Secondary | ICD-10-CM | POA: Diagnosis not present

## 2023-11-18 DIAGNOSIS — Z950 Presence of cardiac pacemaker: Secondary | ICD-10-CM | POA: Diagnosis not present

## 2023-11-18 DIAGNOSIS — I5030 Unspecified diastolic (congestive) heart failure: Secondary | ICD-10-CM | POA: Diagnosis not present

## 2023-11-18 DIAGNOSIS — I2510 Atherosclerotic heart disease of native coronary artery without angina pectoris: Secondary | ICD-10-CM | POA: Insufficient documentation

## 2023-11-18 LAB — URINALYSIS, ROUTINE W REFLEX MICROSCOPIC
Bilirubin Urine: NEGATIVE
Glucose, UA: NEGATIVE mg/dL
Ketones, ur: NEGATIVE mg/dL
Nitrite: NEGATIVE
Protein, ur: NEGATIVE mg/dL
Specific Gravity, Urine: 1.014 (ref 1.005–1.030)
pH: 5 (ref 5.0–8.0)

## 2023-11-18 LAB — TROPONIN I (HIGH SENSITIVITY)
Troponin I (High Sensitivity): 25 ng/L — ABNORMAL HIGH (ref <18)
Troponin I (High Sensitivity): 22 ng/L — ABNORMAL HIGH (ref <18)

## 2023-11-18 LAB — CBC
HCT: 42.3 % (ref 36.0–46.0)
Hemoglobin: 12.9 g/dL (ref 12.0–15.0)
MCH: 26 pg (ref 26.0–34.0)
MCHC: 30.5 g/dL (ref 30.0–36.0)
MCV: 85.3 fL (ref 80.0–100.0)
Platelets: 437 K/uL — ABNORMAL HIGH (ref 150–400)
RBC: 4.96 MIL/uL (ref 3.87–5.11)
RDW: 15.9 % — ABNORMAL HIGH (ref 11.5–15.5)
WBC: 8 K/uL (ref 4.0–10.5)
nRBC: 0 % (ref 0.0–0.2)

## 2023-11-18 LAB — COMPREHENSIVE METABOLIC PANEL WITH GFR
ALT: 13 U/L (ref 0–44)
AST: 20 U/L (ref 15–41)
Albumin: 4 g/dL (ref 3.5–5.0)
Alkaline Phosphatase: 92 U/L (ref 38–126)
Anion gap: 10 (ref 5–15)
BUN: 22 mg/dL (ref 8–23)
CO2: 22 mmol/L (ref 22–32)
Calcium: 9.9 mg/dL (ref 8.9–10.3)
Chloride: 102 mmol/L (ref 98–111)
Creatinine, Ser: 1.15 mg/dL — ABNORMAL HIGH (ref 0.44–1.00)
GFR, Estimated: 50 mL/min — ABNORMAL LOW (ref >60)
Glucose, Bld: 111 mg/dL — ABNORMAL HIGH (ref 70–99)
Potassium: 4.7 mmol/L (ref 3.5–5.1)
Sodium: 134 mmol/L — ABNORMAL LOW (ref 135–145)
Total Bilirubin: 0.9 mg/dL (ref 0.0–1.2)
Total Protein: 7.6 g/dL (ref 6.5–8.1)

## 2023-11-18 LAB — PROTIME-INR
INR: 1.4 — ABNORMAL HIGH (ref 0.8–1.2)
Prothrombin Time: 17.4 s — ABNORMAL HIGH (ref 11.4–15.2)

## 2023-11-18 MED ORDER — ONDANSETRON 4 MG PO TBDP: 4.0000 mg | ORAL_TABLET | Freq: Three times a day (TID) | ORAL | 0 refills | Status: DC | PRN

## 2023-11-18 MED ORDER — CEFTRIAXONE SODIUM 1 G IJ SOLR
1.0000 g | Freq: Once | INTRAMUSCULAR | Status: AC
  Administered 2023-11-18: 1 g via INTRAMUSCULAR
  Filled 2023-11-18: qty 10

## 2023-11-18 MED ORDER — SODIUM CHLORIDE 0.9 % IV SOLN
1.0000 g | Freq: Once | INTRAVENOUS | Status: DC
  Administered 2023-11-18: 1 g via INTRAVENOUS
  Filled 2023-11-18: qty 10

## 2023-11-18 MED ORDER — SODIUM CHLORIDE 0.9 % IV BOLUS
500.0000 mL | Freq: Once | INTRAVENOUS | Status: AC
  Administered 2023-11-18: 500 mL via INTRAVENOUS

## 2023-11-18 MED ORDER — LIDOCAINE HCL (PF) 1 % IJ SOLN
INTRAMUSCULAR | Status: AC
  Filled 2023-11-18: qty 5

## 2023-11-18 MED ORDER — CEPHALEXIN 500 MG PO CAPS: 500.0000 mg | ORAL_CAPSULE | Freq: Two times a day (BID) | ORAL | 0 refills | Status: DC

## 2023-11-18 NOTE — ED Notes (Signed)
Pt ambulated to restroom with one person assist.

## 2023-11-18 NOTE — Telephone Encounter (Signed)
 FYI Only or Action Required?: FYI only for provider.  Patient was last seen in primary care on 10/29/2023 by Randeen Laine LABOR, MD.  Called Nurse Triage reporting Hypotension.  Symptoms began today.  Interventions attempted: Rest, hydration, or home remedies.  Symptoms are: unchanged.  Triage Disposition: Call EMS 911 Now  Patient/caregiver understands and will follow disposition?: Yes Reason for Disposition  [1] Systolic BP < 90 AND [2] feeling weak or lightheaded (e.g., woozy, feeling like they might faint)  Answer Assessment - Initial Assessment Questions This RN contacted patient for telephone triage. She reports symptomatic hypotension. Multiple recent visits for orthostatic hypotension as well as a reported fall on 11/16/23 during which she sustained a head strike.  Due to symptoms and significant comorbidity including: CAD, cadiac cath, carotid artery stenosis, carotid stent, pacemaker, a-fib, seizure disorder, COPD, breast cancer, this RN recommended ED via 911/EMS. Patient agreed.  This RN contacted 911 and confirmed EMS dispatch. Patient and husband were advised that she should not get up. Husband was instructed to unlock the door, secure any pets and gather patient's belongings and medications.   This RN requested patient or husband call the office once discharged.  1. BLOOD PRESSURE: What is your blood pressure? Did you take at least two measurements 5 minutes apart?     85/45 per Alm FRYER, pt has repeated and no improvement  2. ONSET: When did you take your blood pressure?     Last taken at noon  3. HOW: How did you take your blood pressure? (e.g., visiting nurse, automatic home BP monitor)     Machine  4. HISTORY: Do you have a history of low blood pressure? What is your blood pressure normally?     Yes  5. PULSE RATE: Do you know what your pulse rate is?      Unknown  Protocols used: Blood Pressure - Low-A-AH Copied from CRM #[id number]. Topic: Clinical  - Medical Advice >> Nov 18, 2023 11:38 AM Mesmerise C wrote: Reason for CRM: Alm from [facility] stated patient has been complaining of dizziness and passing out when trying to stand up and SOB BP was 85/45 she didn't take her medication this morning as of yet at 11am when he was with her

## 2023-11-18 NOTE — Telephone Encounter (Signed)
 Agree with ED disposition especially in light of head injury on 9/22 Do not see anything in epic yet  Please monitor to make sure she goes Thanks

## 2023-11-18 NOTE — Discharge Instructions (Addendum)
 You are seen in the emergency department for an episode of low blood pressure.  You are given IV fluids in the emergency department.  You did have a mildly elevated troponin, it was lower than your previous admission.  It is importantly follow-up closely with cardiology.    You were found to have a urinary tract infection or started on antibiotics.  You are given a prescription for antibiotics and you can start your next dose tomorrow morning.  Return to the emergency department for any ongoing or worsening symptoms.  Return to the emergency department if you have any worsening symptoms.  Stay hydrated and drink plenty fluids.  Call your primary care physician tomorrow morning to schedule close follow-up appointment.  Make sure that you check all of your home medications and make sure that you are taking them as prescribed.

## 2023-11-18 NOTE — ED Provider Notes (Signed)
 [facility] [place] Provider Note    Event Date/Time   First MD Initiated Contact with Patient 11/18/23 1507     (approximate)   History   Hypotension and Loss of Consciousness  HPI  Diana Cook is a 73 y.o. female past medical history significant for COPD on chronic 4 L of home oxygen , CAD, complete heart block with pacemaker, carotid stenosis with stents on Eliquis , seizure disorder, HFpEF and severe orthostatic hypotension requiring multiple admissions, who presents to the emergency department for not feeling well.  Had an episode today of hypotension.  States that she had lightheadedness with standing and felt like she was going to pass out.  Has been feeling nauseous and having lots of heartburn last night.  States that she has a long history of orthostatic hypotension that started in June.  States that she has been taking midodrine  3 times a day.  Using her abdominal binder.  Believes that she has been taking all of her medications as prescribed.  Does endorse some urinary urgency and frequency but denies any dysuria.  No chest pain or abdominal pain.  No shortness of breath that is new.  On her chronic 4 L of oxygen .  In chart review patient had a hospital stay and was discharged in August for an episode of orthostatic hypotension.  Did have a prolonged syncopal episode during that hospitalization.  Had a low potassium and mildly elevated troponin at that time.  Patient was started on midodrine  during that time.  Was given an abdominal binder.     Physical Exam   Triage Vital Signs: ED Triage Vitals [11/18/23 1332]  Encounter Vitals Group     BP (!) 140/64     Girls Systolic BP Percentile      Girls Diastolic BP Percentile      Boys Systolic BP Percentile      Boys Diastolic BP Percentile      Pulse Rate 60     Resp 17     Temp 97.9 F (36.6 C)     Temp Source Oral     SpO2 97 %     Weight 190 lb (86.2 kg)     Height 5' 9 (1.753 m)     Head  Circumference      Peak Flow      Pain Score 0     Pain Loc      Pain Education      Exclude from Growth Chart     Most recent vital signs: Vitals:   11/18/23 1700 11/18/23 1800  BP: (!) 149/62 (!) 148/72  Pulse: 63 62  Resp:  (!) 21  Temp:    SpO2: 91% 92%    Physical Exam Constitutional:      Appearance: She is well-developed.  HENT:     Head: Atraumatic.  Eyes:     Conjunctiva/sclera: Conjunctivae normal.  Cardiovascular:     Rate and Rhythm: Regular rhythm.  Pulmonary:     Effort: No respiratory distress.     Comments: 4 L of oxygen .  Speaking in full sentences. Abdominal:     General: There is no distension.     Tenderness: There is no abdominal tenderness.  Musculoskeletal:        General: Normal range of motion.     Cervical back: Normal range of motion.     Right lower leg: No edema.     Left lower leg: No edema.  Skin:    General:  Skin is warm.     Capillary Refill: Capillary refill takes less than 2 seconds.  Neurological:     Mental Status: She is alert. Mental status is at baseline.  Psychiatric:        Mood and Affect: Mood normal.     IMPRESSION / MDM / ASSESSMENT AND PLAN / ED COURSE  I reviewed the triage vital signs and the nursing notes.  Differential diagnosis including orthostatic hypotension, dysrhythmia, electrolyte abnormality, anemia, ACS, urinary tract infection  On chart review multiple admissions for orthostatic hypotension  [employer] pacemaker, interrogated in the emergency department  EKG  I, Clotilda Punter, the attending physician, personally viewed and interpreted this ECG.  EKG showed paced rhythm.  Negative Sgarbossa's criteria.  QTc 490.  No significant change when compared to prior EKG  No tachycardic or bradycardic dysrhythmias while on cardiac telemetry.  RADIOLOGY  LABS (all labs ordered are listed, but only abnormal results are displayed) Labs interpreted as -    Labs Reviewed  COMPREHENSIVE  METABOLIC PANEL WITH GFR - Abnormal; Notable for the following components:      Result Value   Sodium 134 (*)    Glucose, Bld 111 (*)    Creatinine, Ser 1.15 (*)    GFR, Estimated 50 (*)    All other components within normal limits  CBC - Abnormal; Notable for the following components:   RDW 15.9 (*)    Platelets 437 (*)    All other components within normal limits  URINALYSIS, ROUTINE W REFLEX MICROSCOPIC - Abnormal; Notable for the following components:   Color, Urine YELLOW (*)    APPearance HAZY (*)    Hgb urine dipstick SMALL (*)    Leukocytes,Ua TRACE (*)    Bacteria, UA RARE (*)    All other components within normal limits  PROTIME-INR - Abnormal; Notable for the following components:   Prothrombin Time 17.4 (*)    INR 1.4 (*)    All other components within normal limits  TROPONIN I (HIGH SENSITIVITY) - Abnormal; Notable for the following components:   Troponin I (High Sensitivity) 25 (*)    All other components within normal limits  TROPONIN I (HIGH SENSITIVITY) - Abnormal; Notable for the following components:   Troponin I (High Sensitivity) 22 (*)    All other components within normal limits  URINE CULTURE  CBG MONITORING, ED     MDM  Patient did have positive orthostatic hypotension.  Does have a long history of similar.  Given IV fluids in the emergency department and able to tolerate p.o.  Lab work overall at 4 normal.  Creatinine appears to be at her baseline.  Mild hyponatremia at 134.  Creatinine is 1.1 with a baseline of 0.9.  No significant leukocytosis.  No significant anemia.  Serial troponins are slightly positive but were downtrending.  Has no chest pain or shortness of breath, have a low suspicion for ACS.  Last hospitalization her troponin was elevated at 81.  She does follow closely with cardiology.  Remains chest pain-free and no shortness of breath.  Low suspicion for pulmonary embolism.  Discussed close follow-up with primary care physician.  Found to  have concerns for urinary tract infection.  Initially ordered IV Rocephin  however IV infiltrated.  After discussion with the patient she would like IM Rocephin .  No history of a resistant urinary tract infection.  Will start on Keflex .     PROCEDURES:  Critical Care performed: No  Procedures  Patient's presentation is  most consistent with acute presentation with potential threat to life or bodily function.   MEDICATIONS ORDERED IN ED: Medications  cefTRIAXone  (ROCEPHIN ) injection 1 g (has no administration in time range)  sodium chloride  0.9 % bolus 500 mL (0 mLs Intravenous Stopped 11/18/23 1845)    FINAL CLINICAL IMPRESSION(S) / ED DIAGNOSES   Final diagnoses:  Orthostatic hypotension  Syncope, unspecified syncope type  Acute cystitis without hematuria     Rx / DC Orders   ED Discharge Orders          Ordered    cephALEXin  (KEFLEX ) 500 MG capsule  2 times daily        11/18/23 1851    ondansetron  (ZOFRAN -ODT) 4 MG disintegrating tablet  Every 8 hours PRN        11/18/23 1851             Note:  This document was prepared using Dragon voice recognition software and may include unintentional dictation errors.   Suzanne Kirsch, MD 11/18/23 [phone]

## 2023-11-18 NOTE — ED Triage Notes (Signed)
 Patient to ED via [facility] from home for hypotension. States recently admitted in July for same. Possible syncopal episode this AM. Given 4mg  IM zofran  by EMS. Wears 4L [place] at baseline.   141/76- 82/46 when standing

## 2023-11-19 LAB — URINE CULTURE: Culture: <10000 — AB

## 2023-11-19 NOTE — Telephone Encounter (Signed)
 Aware, will see her as planned

## 2023-11-19 NOTE — Telephone Encounter (Signed)
 Pt went to ED. No documentation in ED notes regarding recent head strike. Does not appear to have been worked up for that.

## 2023-11-23 ENCOUNTER — Inpatient Hospital Stay
Admission: EM | Admit: 2023-11-23 | Discharge: 2023-11-24 | DRG: 682 | Disposition: A | Discharge status: Home / Self Care | Attending: Obstetrics and Gynecology | Admitting: Obstetrics and Gynecology

## 2023-11-23 ENCOUNTER — Emergency Department

## 2023-11-23 ENCOUNTER — Other Ambulatory Visit: Payer: Self-pay

## 2023-11-23 ENCOUNTER — Encounter: Payer: Self-pay | Admitting: Internal Medicine

## 2023-11-23 ENCOUNTER — Inpatient Hospital Stay: Admitting: Family Medicine

## 2023-11-23 DIAGNOSIS — Z950 Presence of cardiac pacemaker: Secondary | ICD-10-CM

## 2023-11-23 DIAGNOSIS — L732 Hidradenitis suppurativa: Secondary | ICD-10-CM | POA: Diagnosis present

## 2023-11-23 DIAGNOSIS — J9611 Chronic respiratory failure with hypoxia: Secondary | ICD-10-CM | POA: Diagnosis present

## 2023-11-23 DIAGNOSIS — Z87891 Personal history of nicotine dependence: Secondary | ICD-10-CM

## 2023-11-23 DIAGNOSIS — Z85118 Personal history of other malignant neoplasm of bronchus and lung: Secondary | ICD-10-CM

## 2023-11-23 DIAGNOSIS — Z66 Do not resuscitate: Secondary | ICD-10-CM | POA: Diagnosis present

## 2023-11-23 DIAGNOSIS — G4733 Obstructive sleep apnea (adult) (pediatric): Secondary | ICD-10-CM | POA: Diagnosis present

## 2023-11-23 DIAGNOSIS — I442 Atrioventricular block, complete: Secondary | ICD-10-CM | POA: Diagnosis present

## 2023-11-23 DIAGNOSIS — R569 Unspecified convulsions: Secondary | ICD-10-CM | POA: Insufficient documentation

## 2023-11-23 DIAGNOSIS — Z823 Family history of stroke: Secondary | ICD-10-CM

## 2023-11-23 DIAGNOSIS — I951 Orthostatic hypotension: Secondary | ICD-10-CM | POA: Diagnosis present

## 2023-11-23 DIAGNOSIS — J440 Chronic obstructive pulmonary disease with acute lower respiratory infection: Secondary | ICD-10-CM | POA: Diagnosis present

## 2023-11-23 DIAGNOSIS — N179 Acute kidney failure, unspecified: Principal | ICD-10-CM | POA: Diagnosis present

## 2023-11-23 DIAGNOSIS — Z860100 Personal history of colon polyps, unspecified: Secondary | ICD-10-CM

## 2023-11-23 DIAGNOSIS — I480 Paroxysmal atrial fibrillation: Secondary | ICD-10-CM | POA: Diagnosis present

## 2023-11-23 DIAGNOSIS — Z853 Personal history of malignant neoplasm of breast: Secondary | ICD-10-CM

## 2023-11-23 DIAGNOSIS — Z9981 Dependence on supplemental oxygen: Secondary | ICD-10-CM

## 2023-11-23 DIAGNOSIS — Z955 Presence of coronary angioplasty implant and graft: Secondary | ICD-10-CM

## 2023-11-23 DIAGNOSIS — R112 Nausea with vomiting, unspecified: Secondary | ICD-10-CM | POA: Diagnosis present

## 2023-11-23 DIAGNOSIS — Z803 Family history of malignant neoplasm of breast: Secondary | ICD-10-CM

## 2023-11-23 DIAGNOSIS — Z7901 Long term (current) use of anticoagulants: Secondary | ICD-10-CM

## 2023-11-23 DIAGNOSIS — J449 Chronic obstructive pulmonary disease, unspecified: Secondary | ICD-10-CM | POA: Insufficient documentation

## 2023-11-23 DIAGNOSIS — Z9842 Cataract extraction status, left eye: Secondary | ICD-10-CM

## 2023-11-23 DIAGNOSIS — Z8249 Family history of ischemic heart disease and other diseases of the circulatory system: Secondary | ICD-10-CM

## 2023-11-23 DIAGNOSIS — K219 Gastro-esophageal reflux disease without esophagitis: Secondary | ICD-10-CM | POA: Diagnosis present

## 2023-11-23 DIAGNOSIS — I5032 Chronic diastolic (congestive) heart failure: Secondary | ICD-10-CM | POA: Diagnosis not present

## 2023-11-23 DIAGNOSIS — R42 Dizziness and giddiness: Principal | ICD-10-CM

## 2023-11-23 DIAGNOSIS — F32A Depression, unspecified: Secondary | ICD-10-CM | POA: Diagnosis not present

## 2023-11-23 DIAGNOSIS — I252 Old myocardial infarction: Secondary | ICD-10-CM

## 2023-11-23 DIAGNOSIS — E785 Hyperlipidemia, unspecified: Secondary | ICD-10-CM | POA: Diagnosis not present

## 2023-11-23 DIAGNOSIS — Z1721 Progesterone receptor positive status: Secondary | ICD-10-CM

## 2023-11-23 DIAGNOSIS — G4730 Sleep apnea, unspecified: Secondary | ICD-10-CM | POA: Diagnosis present

## 2023-11-23 DIAGNOSIS — J189 Pneumonia, unspecified organism: Principal | ICD-10-CM | POA: Diagnosis present

## 2023-11-23 DIAGNOSIS — G40909 Epilepsy, unspecified, not intractable, without status epilepticus: Secondary | ICD-10-CM | POA: Diagnosis present

## 2023-11-23 DIAGNOSIS — I25118 Atherosclerotic heart disease of native coronary artery with other forms of angina pectoris: Secondary | ICD-10-CM | POA: Diagnosis present

## 2023-11-23 DIAGNOSIS — E875 Hyperkalemia: Secondary | ICD-10-CM | POA: Diagnosis present

## 2023-11-23 DIAGNOSIS — I6529 Occlusion and stenosis of unspecified carotid artery: Secondary | ICD-10-CM | POA: Diagnosis present

## 2023-11-23 DIAGNOSIS — C3491 Malignant neoplasm of unspecified part of right bronchus or lung: Secondary | ICD-10-CM | POA: Diagnosis present

## 2023-11-23 DIAGNOSIS — J45909 Unspecified asthma, uncomplicated: Secondary | ICD-10-CM | POA: Insufficient documentation

## 2023-11-23 DIAGNOSIS — I2510 Atherosclerotic heart disease of native coronary artery without angina pectoris: Secondary | ICD-10-CM | POA: Diagnosis present

## 2023-11-23 DIAGNOSIS — Z923 Personal history of irradiation: Secondary | ICD-10-CM

## 2023-11-23 DIAGNOSIS — Z9841 Cataract extraction status, right eye: Secondary | ICD-10-CM

## 2023-11-23 DIAGNOSIS — M8580 Other specified disorders of bone density and structure, unspecified site: Secondary | ICD-10-CM | POA: Diagnosis present

## 2023-11-23 DIAGNOSIS — R0602 Shortness of breath: Secondary | ICD-10-CM | POA: Insufficient documentation

## 2023-11-23 DIAGNOSIS — Z95828 Presence of other vascular implants and grafts: Secondary | ICD-10-CM

## 2023-11-23 DIAGNOSIS — I6521 Occlusion and stenosis of right carotid artery: Secondary | ICD-10-CM | POA: Insufficient documentation

## 2023-11-23 DIAGNOSIS — Z7982 Long term (current) use of aspirin: Secondary | ICD-10-CM

## 2023-11-23 DIAGNOSIS — I959 Hypotension, unspecified: Secondary | ICD-10-CM | POA: Diagnosis present

## 2023-11-23 DIAGNOSIS — Z90710 Acquired absence of both cervix and uterus: Secondary | ICD-10-CM

## 2023-11-23 DIAGNOSIS — Z79899 Other long term (current) drug therapy: Secondary | ICD-10-CM

## 2023-11-23 LAB — CBC WITH DIFFERENTIAL/PLATELET
Abs Immature Granulocytes: 0.07 K/uL (ref 0.00–0.07)
Basophils Absolute: 0.1 K/uL (ref 0.0–0.1)
Basophils Relative: 0 %
Eosinophils Absolute: 0.1 K/uL (ref 0.0–0.5)
Eosinophils Relative: 0 %
HCT: 45.6 % (ref 36.0–46.0)
Hemoglobin: 14.3 g/dL (ref 12.0–15.0)
Immature Granulocytes: 1 %
Lymphocytes Relative: 7 %
Lymphs Abs: 1.1 K/uL (ref 0.7–4.0)
MCH: 26.3 pg (ref 26.0–34.0)
MCHC: 31.4 g/dL (ref 30.0–36.0)
MCV: 83.8 fL (ref 80.0–100.0)
Monocytes Absolute: 1.2 K/uL — ABNORMAL HIGH (ref 0.1–1.0)
Monocytes Relative: 8 %
Neutro Abs: 12.4 K/uL — ABNORMAL HIGH (ref 1.7–7.7)
Neutrophils Relative %: 84 %
Platelets: 421 K/uL — ABNORMAL HIGH (ref 150–400)
RBC: 5.44 MIL/uL — ABNORMAL HIGH (ref 3.87–5.11)
RDW: 15.9 % — ABNORMAL HIGH (ref 11.5–15.5)
WBC: 14.7 K/uL — ABNORMAL HIGH (ref 4.0–10.5)
nRBC: 0 % (ref 0.0–0.2)

## 2023-11-23 LAB — COMPREHENSIVE METABOLIC PANEL WITH GFR
ALT: 17 U/L (ref 0–44)
AST: 26 U/L (ref 15–41)
Albumin: 4.5 g/dL (ref 3.5–5.0)
Alkaline Phosphatase: 96 U/L (ref 38–126)
Anion gap: 14 (ref 5–15)
BUN: 28 mg/dL — ABNORMAL HIGH (ref 8–23)
CO2: 24 mmol/L (ref 22–32)
Calcium: 10.1 mg/dL (ref 8.9–10.3)
Chloride: 98 mmol/L (ref 98–111)
Creatinine, Ser: 1.42 mg/dL — ABNORMAL HIGH (ref 0.44–1.00)
GFR, Estimated: 39 mL/min — ABNORMAL LOW (ref >60)
Glucose, Bld: 123 mg/dL — ABNORMAL HIGH (ref 70–99)
Potassium: 5.2 mmol/L — ABNORMAL HIGH (ref 3.5–5.1)
Sodium: 136 mmol/L (ref 135–145)
Total Bilirubin: 2 mg/dL — ABNORMAL HIGH (ref 0.0–1.2)
Total Protein: 8.5 g/dL — ABNORMAL HIGH (ref 6.5–8.1)

## 2023-11-23 LAB — LIPASE, BLOOD: Lipase: 15 U/L (ref 11–51)

## 2023-11-23 LAB — TROPONIN I (HIGH SENSITIVITY)
Troponin I (High Sensitivity): 68 ng/L — ABNORMAL HIGH (ref <18)
Troponin I (High Sensitivity): 60 ng/L — ABNORMAL HIGH (ref <18)

## 2023-11-23 LAB — RESP PANEL BY RT-PCR (RSV, FLU A&B, COVID)  RVPGX2
Influenza A by PCR: NEGATIVE
Influenza B by PCR: NEGATIVE
Resp Syncytial Virus by PCR: NEGATIVE
SARS Coronavirus 2 by RT PCR: NEGATIVE

## 2023-11-23 LAB — LACTIC ACID, PLASMA
Lactic Acid, Venous: 1.1 mmol/L (ref 0.5–1.9)
Lactic Acid, Venous: 1.1 mmol/L (ref 0.5–1.9)

## 2023-11-23 MED ORDER — MELATONIN 5 MG PO TABS
5.0000 mg | ORAL_TABLET | Freq: Every day | ORAL | Status: DC
  Administered 2023-11-23: 5 mg via ORAL
  Filled 2023-11-23: qty 1

## 2023-11-23 MED ORDER — SODIUM CHLORIDE 0.9 % IV BOLUS
1000.0000 mL | Freq: Once | INTRAVENOUS | Status: AC
  Administered 2023-11-23: 1000 mL via INTRAVENOUS

## 2023-11-23 MED ORDER — ACETAMINOPHEN 650 MG RE SUPP: 650.0000 mg | Freq: Four times a day (QID) | RECTAL | Status: DC | PRN

## 2023-11-23 MED ORDER — SODIUM CHLORIDE 0.9 % IV SOLN
500.0000 mg | Freq: Once | INTRAVENOUS | Status: AC
  Administered 2023-11-23: 500 mg via INTRAVENOUS
  Filled 2023-11-23: qty 5

## 2023-11-23 MED ORDER — ACETAMINOPHEN 325 MG PO TABS
650.0000 mg | ORAL_TABLET | Freq: Four times a day (QID) | ORAL | Status: DC | PRN
  Administered 2023-11-24: 650 mg via ORAL
  Filled 2023-11-23: qty 2

## 2023-11-23 MED ORDER — ONDANSETRON HCL 4 MG/2ML IJ SOLN: 4.0000 mg | Freq: Four times a day (QID) | INTRAMUSCULAR | Status: DC | PRN

## 2023-11-23 MED ORDER — FLUDROCORTISONE ACETATE 0.1 MG PO TABS
0.2000 mg | ORAL_TABLET | Freq: Two times a day (BID) | ORAL | Status: DC
Start: 2023-11-23 — End: 2023-11-24
  Administered 2023-11-23 – 2023-11-24 (×2): 0.2 mg via ORAL
  Filled 2023-11-23 (×3): qty 2

## 2023-11-23 MED ORDER — IPRATROPIUM-ALBUTEROL 0.5-2.5 (3) MG/3ML IN SOLN
3.0000 mL | Freq: Four times a day (QID) | RESPIRATORY_TRACT | Status: DC | PRN
Start: 2023-11-23 — End: 2023-11-24

## 2023-11-23 MED ORDER — SODIUM CHLORIDE 0.9 % IV SOLN
500.0000 mg | INTRAVENOUS | Status: DC
  Administered 2023-11-24: 500 mg via INTRAVENOUS
  Filled 2023-11-23: qty 5

## 2023-11-23 MED ORDER — METOCLOPRAMIDE HCL 5 MG/ML IJ SOLN
10.0000 mg | Freq: Once | INTRAMUSCULAR | Status: AC
  Administered 2023-11-23: 10 mg via INTRAVENOUS
  Filled 2023-11-23: qty 2

## 2023-11-23 MED ORDER — PYRIDOSTIGMINE BROMIDE 60 MG PO TABS
60.0000 mg | ORAL_TABLET | Freq: Three times a day (TID) | ORAL | Status: DC
  Administered 2023-11-23 – 2023-11-24 (×2): 60 mg via ORAL
  Filled 2023-11-23 (×5): qty 1

## 2023-11-23 MED ORDER — SODIUM CHLORIDE 0.9% FLUSH: 10.0000 mL | INTRAVENOUS | Status: DC | PRN

## 2023-11-23 MED ORDER — SODIUM CHLORIDE 0.9 % IV SOLN: INTRAVENOUS | Status: DC

## 2023-11-23 MED ORDER — MIDODRINE HCL 5 MG PO TABS
10.0000 mg | ORAL_TABLET | Freq: Three times a day (TID) | ORAL | Status: DC
Start: 2023-11-23 — End: 2023-11-24
  Administered 2023-11-23 – 2023-11-24 (×3): 10 mg via ORAL
  Filled 2023-11-23 (×3): qty 2

## 2023-11-23 MED ORDER — CHLORHEXIDINE GLUCONATE CLOTH 2 % EX PADS
6.0000 | MEDICATED_PAD | Freq: Every day | CUTANEOUS | Status: DC
  Administered 2023-11-24: 6 via TOPICAL

## 2023-11-23 MED ORDER — SODIUM CHLORIDE 0.9% FLUSH
10.0000 mL | Freq: Two times a day (BID) | INTRAVENOUS | Status: DC
  Administered 2023-11-23: 10 mL

## 2023-11-23 MED ORDER — LACTATED RINGERS IV SOLN: 150.0000 mL/h | INTRAVENOUS | Status: DC

## 2023-11-23 MED ORDER — ONDANSETRON HCL 4 MG PO TABS: 4.0000 mg | ORAL_TABLET | Freq: Four times a day (QID) | ORAL | Status: DC | PRN

## 2023-11-23 MED ORDER — SODIUM CHLORIDE 0.9 % IV SOLN
2.0000 g | Freq: Once | INTRAVENOUS | Status: AC
  Administered 2023-11-23: 2 g via INTRAVENOUS
  Filled 2023-11-23: qty 20

## 2023-11-23 MED ORDER — APIXABAN 5 MG PO TABS
5.0000 mg | ORAL_TABLET | Freq: Two times a day (BID) | ORAL | Status: DC
Start: 2023-11-23 — End: 2023-11-24
  Administered 2023-11-23 – 2023-11-24 (×2): 5 mg via ORAL
  Filled 2023-11-23 (×2): qty 1

## 2023-11-23 MED ORDER — SODIUM CHLORIDE 0.9 % IV SOLN
2.0000 g | INTRAVENOUS | Status: DC
  Administered 2023-11-24: 2 g via INTRAVENOUS
  Filled 2023-11-23: qty 20

## 2023-11-23 MED ORDER — LACTATED RINGERS IV BOLUS (SEPSIS): 1000.0000 mL | Freq: Once | INTRAVENOUS | Status: DC

## 2023-11-23 MED ORDER — LEVETIRACETAM 250 MG PO TABS
250.0000 mg | ORAL_TABLET | Freq: Two times a day (BID) | ORAL | Status: DC
  Administered 2023-11-23 – 2023-11-24 (×2): 250 mg via ORAL
  Filled 2023-11-23 (×3): qty 1

## 2023-11-23 NOTE — ED Triage Notes (Signed)
 Patient to ED via [facility] from home for hypotension with nausea and dizziness. Hx of same- take midodrine . Pt reports taking po zofran  at home approx 1 hr ago. +orthostatics with EMS. 116/70 laying down and 70/50 sitting up. Wears 4L [place] at baseline. Hx COPD and pacemaker. Seen for same on 9/24

## 2023-11-23 NOTE — ED Provider Notes (Signed)
 [facility] Provider Note    Event Date/Time   First MD Initiated Contact with Patient 11/23/23 [phone]     (approximate)   History   Hypotension   HPI  Diana Cook is a 73 year old female with history of COPD, CAD, CHB s/p pacemaker, carotid stenosis on Eliquis , seizure disorder, CHF, orthostatic hypotension presenting to the emergency department for evaluation of lightheadedness.  Denies syncope.  Reports this feels similar to prior episodes that she has had.  Reports multiple episodes of vomiting, but says that this is not atypical for her episodes of hypotension.  Had positive orthostatics with EMS.  Does additionally report that since this morning she has had a productive cough with some increased shortness of breath.  Does report recently admitted for pneumonia, but symptoms had resolved following that until this morning.  Reviewed discharge summary from 10/02/2023.  At that time patient presented following a syncopal episode.  Thought to be secondary to orthostatic hypotension, continued on medications as for her blood pressure.     Physical Exam   Triage Vital Signs: ED Triage Vitals  Encounter Vitals Group     BP 11/23/23 0916 122/68     Girls Systolic BP Percentile --      Girls Diastolic BP Percentile --      Boys Systolic BP Percentile --      Boys Diastolic BP Percentile --      Pulse Rate 11/23/23 0916 87     Resp 11/23/23 0916 17     Temp 11/23/23 0917 98.2 F (36.8 C)     Temp Source 11/23/23 0917 Axillary     SpO2 11/23/23 0916 94 %     Weight 11/23/23 0915 189 lb 9.5 oz (86 kg)     Height 11/23/23 0915 5' 9 (1.753 m)     Head Circumference --      Peak Flow --      Pain Score 11/23/23 0914 0     Pain Loc --      Pain Education --      Exclude from Growth Chart --     Most recent vital signs: Vitals:   11/23/23 1000 11/23/23 1100  BP: (!) 141/78 (!) 144/75  Pulse: 83 76  Resp: 14 (!) 24  Temp:    SpO2: 93% 93%      General: Awake, interactive  CV:  Regular rate, good peripheral perfusion.  Resp:  Unlabored respirations, lungs clear to auscultation without appreciable wheezing, satting 88 to 92% on 4 L Abd:  Nondistended.  Neuro:  Symmetric facial movement, fluid speech   ED Results / Procedures / Treatments   Labs (all labs ordered are listed, but only abnormal results are displayed) Labs Reviewed  CBC WITH DIFFERENTIAL/PLATELET - Abnormal; Notable for the following components:      Result Value   WBC 14.7 (*)    RBC 5.44 (*)    RDW 15.9 (*)    Platelets 421 (*)    Neutro Abs 12.4 (*)    Monocytes Absolute 1.2 (*)    All other components within normal limits  COMPREHENSIVE METABOLIC PANEL WITH GFR - Abnormal; Notable for the following components:   Potassium 5.2 (*)    Glucose, Bld 123 (*)    BUN 28 (*)    Creatinine, Ser 1.42 (*)    Total Protein 8.5 (*)    Total Bilirubin 2.0 (*)    GFR, Estimated 39 (*)    All other components  within normal limits  TROPONIN I (HIGH SENSITIVITY) - Abnormal; Notable for the following components:   Troponin I (High Sensitivity) 68 (*)    All other components within normal limits  TROPONIN I (HIGH SENSITIVITY) - Abnormal; Notable for the following components:   Troponin I (High Sensitivity) 60 (*)    All other components within normal limits  RESP PANEL BY RT-PCR (RSV, FLU A&B, COVID)  RVPGX2  CULTURE, BLOOD (ROUTINE X 2)  CULTURE, BLOOD (ROUTINE X 2)  URINALYSIS, W/ REFLEX TO CULTURE (INFECTION SUSPECTED)  LIPASE, BLOOD  LACTIC ACID, PLASMA  LACTIC ACID, PLASMA     EKG EKG independently reviewed and interpreted by myself demonstrates:    RADIOLOGY Imaging independently reviewed and interpreted by myself demonstrates:  CXR concerning for left-sided opacity  Formal Radiology Read:  DG Chest Portable 1 View Result Date: 11/23/2023 CLINICAL DATA:  73 year old female with weakness. EXAM: PORTABLE CHEST 1 VIEW COMPARISON:  Chest  radiographs 10/29/2023 and earlier. FINDINGS: Portable AP upright view at 1034 hours. Emphysema by CTA in July. Chronic curvilinear scarring at the level of the right hilum appears unchanged since that time. Streaky left perihilar opacity is increased from the July CTA, but regressed from earlier this month. Stable left chest pacemaker. Stable cardiac size and mediastinal contours. Calcified aortic atherosclerosis. Visualized tracheal air column is within normal limits. No pneumothorax or pulmonary edema. No pleural effusion or other confluent lung opacity. IMPRESSION: Emphysema (ICD10-J43.9). Streaky left perihilar lung opacity regressed but not fully resolved since 10/29/2023. Consider unresolved infectious exacerbation. Stable curvilinear right perihilar lung scarring since July. Electronically Signed   By: VEAR Hurst M.D.   On: 11/23/2023 10:59    PROCEDURES:  Critical Care performed: Yes, see critical care procedure note(s)  CRITICAL CARE Performed by: Nilsa Dade   Total critical care time: 32 minutes  Critical care time was exclusive of separately billable procedures and treating other patients.  Critical care was necessary to treat or prevent imminent or life-threatening deterioration.  Critical care was time spent personally by me on the following activities: development of treatment plan with patient and/or surrogate as well as nursing, discussions with consultants, evaluation of patient's response to treatment, examination of patient, obtaining history from patient or surrogate, ordering and performing treatments and interventions, ordering and review of laboratory studies, ordering and review of radiographic studies, pulse oximetry and re-evaluation of patient's condition.   Procedures   MEDICATIONS ORDERED IN ED: Medications  cefTRIAXone  (ROCEPHIN ) 2 g in sodium chloride  0.9 % 100 mL IVPB (has no administration in time range)  azithromycin  (ZITHROMAX ) 500 mg in sodium chloride  0.9 %  250 mL IVPB (has no administration in time range)  sodium chloride  0.9 % bolus 1,000 mL (0 mLs Intravenous Stopped 11/23/23 1250)  metoCLOPramide (REGLAN) injection 10 mg (10 mg Intravenous Given 11/23/23 0936)     IMPRESSION / MDM / ASSESSMENT AND PLAN / ED COURSE  I reviewed the triage vital signs and the nursing notes.  Differential diagnosis includes, but is not limited to, infection including UTI, pneumonia, anemia, electrolyte abnormality, exacerbation of baseline orthostatic hypotension, lower suspicion acute intra-abdominal process given reassuring abdominal exam  Patient's presentation is most consistent with acute presentation with potential threat to life or bodily function.  73 year old female presenting to the emergency department for evaluation of lightheadedness.  Stable vitals on presentation here, but did have positive orthostatics with EMS.  Labs here with new leukocytosis with WC of 14, normal hemoglobin.  CMP with mild AKI with  creatinine of 1.42, ordered for IV fluids.  Troponin is elevated at 68, downtrend to 68 on repeat, increased from 5 days ago in the 20s.  X-[place] Deitrick is concerning for unresolved pneumonia.  Patient does report new respiratory symptoms concerning for incompletely treated or recurrent pneumonia.  With tachypnea and leukocytosis, she does meet SIRS criteria.  Sepsis orders initiated with empiric ceftriaxone  and azithromycin .  Will reach out to hospitalist team to discuss admission.  Case discussed with Dr. Sherre.  She will evaluate for anticipated admission.     FINAL CLINICAL IMPRESSION(S) / ED DIAGNOSES   Final diagnoses:  Lightheadedness  Pneumonia of left lower lobe due to infectious organism     Rx / DC Orders   ED Discharge Orders     None        Note:  This document was prepared using Dragon voice recognition software and may include unintentional dictation errors.   Levander Slate, MD 11/23/23 [phone]

## 2023-11-23 NOTE — ED Notes (Signed)
Snack provided to patient per request.  

## 2023-11-23 NOTE — Progress Notes (Signed)
 Left femoral TLC placed by Inge Lecher, NP at bedside, assisted by this RN. Pt is alert and oriented and tolerated procedure well. All VSS during procedure. Consent signed by patient.

## 2023-11-23 NOTE — Assessment & Plan Note (Addendum)
 With no prior CKD Status post sodium chloride  1 L bolus per EDP Continue with NS 1 L bolus on admission followed by NS infusion at 125 mL/h, 1 day ordered Strict I's and O's Recheck BMP in the a.m.

## 2023-11-23 NOTE — Progress Notes (Signed)
 VAT consulted for PIV insertion.  Arrived to room, observed ED staff placing PIV via US .  Consult complete.

## 2023-11-23 NOTE — ED Notes (Signed)
 EDP, Ray at bedside; US  IV attempted x2 per EDP, unsuccessful at this time.

## 2023-11-23 NOTE — Progress Notes (Signed)
 VAT consulted for midline.  Care RN notified via secure chat pts current medication therapy is not recommended to infuse using midline. CVC recommended due to poor vasculature. DO to up date order.  Consult complete.

## 2023-11-23 NOTE — Assessment & Plan Note (Signed)
 Resumed home pyridostigmine  60 mg 3 times daily, fludrocortisone  0.2 mg p.o. twice daily, midodrine  10 mg p.o. 3 times daily with meals

## 2023-11-23 NOTE — Procedures (Signed)
 Central Venous Catheter Insertion Procedure Note  Diana Cook  [id number]  1950/03/06  Date:11/23/23  Time:7:10 PM   Provider Performing:Cinda Hara D Shellia   Procedure: Insertion of Non-tunneled Central Venous [phone]) with US  guidance ([id number])   Indication(s) Medication administration and Difficult access  Consent Risks of the procedure as well as the alternatives and risks of each were explained to the patient and/or caregiver.  Consent for the procedure was obtained and is signed in the bedside chart  Anesthesia Topical only with 1% lidocaine    Timeout Verified patient identification, verified procedure, site/side was marked, verified correct patient position, special equipment/implants available, medications/allergies/relevant history reviewed, required imaging and test results available.  Sterile Technique Maximal sterile technique including full sterile barrier drape, hand hygiene, sterile gown, sterile gloves, mask, hair covering, sterile ultrasound probe cover (if used).  Procedure Description Area of catheter insertion was cleaned with chlorhexidine and draped in sterile fashion.  With real-time ultrasound guidance a central venous catheter was placed into the left femoral vein. Nonpulsatile blood flow and easy flushing noted in all ports.  The catheter was sutured in place and sterile dressing applied.  Complications/Tolerance None; patient tolerated the procedure well. Chest X-ray is ordered to verify placement for internal jugular or subclavian cannulation.   Chest x-ray is not ordered for femoral cannulation.  EBL Minimal  Specimen(s) None    Line inserted to the 20 cm mark.   Inge Shellia, AGACNP-BC [facility] Prefer epic messenger for cross cover needs If after hours, please call E-link

## 2023-11-23 NOTE — ED Notes (Signed)
 EDP, Ray at bedside; update provided to patient.

## 2023-11-23 NOTE — ED Notes (Signed)
 PIV infiltrated; this RN attempted new access but unsuccessful at this time.  Lang, RN to bedside for other attempt.

## 2023-11-23 NOTE — Sepsis Progress Note (Signed)
 Notified bedside nurse of need to draw lactic acid.

## 2023-11-23 NOTE — Sepsis Progress Note (Signed)
 Difficult IV stick.

## 2023-11-23 NOTE — Assessment & Plan Note (Addendum)
 Secondary to acute kidney injury, strict I's and O's Continue with sodium chloride  infusion at 125 mL/h, 1 day ordered

## 2023-11-23 NOTE — ED Notes (Signed)
 Patient is currently without PIV access; this RN and Lang, RN have made multiple attempts.  Per [facility], IV team is not here currently and does not know when they will arrive.  Lab has been notified in attempt to collect BC's and Lactic Acid.  EDP, Ray made aware with plans to attempt via US .    Unable to meet Sepsis criteria timeline due to these circumstances.

## 2023-11-23 NOTE — Assessment & Plan Note (Signed)
-   Resumed home Eliquis 5 mg p.o. twice daily 

## 2023-11-23 NOTE — ED Notes (Signed)
 Hospitalist at bedside

## 2023-11-23 NOTE — Assessment & Plan Note (Addendum)
 Completed azithromycin  500 mg IV daily, ceftriaxone  2 g IV daily to complete a 5-day course Incentive spirometry, flutter valve DuoNebs every 6 hours as needed for shortness of breath or wheezing Midline order as patient does not currently have peripheral IV

## 2023-11-23 NOTE — Progress Notes (Signed)
 VAT consulted for Midline.  Care RN and MD notified via secure chat to review previous IV team not. Pt is not candidate for midline. CVC recommended.  Consult complete.

## 2023-11-23 NOTE — Assessment & Plan Note (Signed)
 Resumed home Keppra  250 mg p.o. twice daily

## 2023-11-23 NOTE — Hospital Course (Addendum)
 [name] is a 73 year old female with history of CAD, COPD on chronic 4 L nasal cannula, complete heart block status post pacemaker, carotid stenosis status post stent placement and on Eliquis , history of seizure, brittle orthostatic hypotension requiring multiple medications, who presents ED for chief concerns of hypotension, nausea, dizziness, new onset productive cough.  Vitals in the ED showed t 98.2, rr 14, hr 83, blood pressure 141/78, SpO2 93% on 4 L nasal cannula.  Serum sodium is 136, potassium 5.2, chloride 98, bicarb 24, BUN of 28, serum creatinine 1.42, eGFR 39, nonfasting blood glucose 123, WBC 14.7, hemoglobin 14.3, platelets of 421.  HS troponin is 68 and on repeat is 60.  T. bili is 2.0.  AST/ALT were within normal limits.  Blood cultures x 2 ordered and pending collection, lactic acid has been ordered and pending collection, UA has been ordered and pending collection  ED treatment: Azithromycin  500 mg IV one-time dose, ceftriaxone  2 g IV one-time dose, sodium chloride  1 L bolus.

## 2023-11-23 NOTE — ED Notes (Addendum)
 This RN asked patient to provided Urine Specimen; states she will need to have some water prior to doing so.

## 2023-11-23 NOTE — H&P (Addendum)
 History and Physical   Diana Cook FMW:[id number] DOB: 1950-09-19 DOA: 11/23/2023  PCP: Randeen Laine LABOR, MD  Outpatient Specialists: Dr. Tamea, pulmonology Patient coming from: Home via EMS  I have personally briefly reviewed patient's old medical records in [facility] EMR.  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  Vitals in the ED showed t 98.2, rr 14, hr 83, blood pressure 141/78, SpO2 93% on 4 L nasal cannula.  Serum sodium is 136, potassium 5.2, chloride 98, bicarb 24, BUN of 28, serum creatinine 1.42, eGFR 39, nonfasting blood glucose 123, WBC 14.7, hemoglobin 14.3, platelets of 421.  HS troponin is 68 and on repeat is 60.  T. bili is 2.0.  AST/ALT were within normal limits.  Blood cultures x 2 ordered and pending collection, lactic acid has been ordered and pending collection, UA has been ordered and pending collection  ED treatment: Azithromycin  500 mg IV one-time dose, ceftriaxone  2 g IV one-time dose, sodium chloride  1 L bolus. ----------------------------------------- At bedside, patient able to tell me her first and last name, age, location, current calendar year.  She reports that over the last 2 days, she has had increased productive cough that is yellow sputum.  She denies fever, chills, chest pain, shortness of breath, dysuria, hematuria.  She endorses nausea and vomiting of clear fluid over the last 2 days in addition to abdominal discomfort.  She denies trauma to her person.  Social history: She lives at home with her husband.  She denies tobacco, frequent  EtOH use, recreational drug use.  Her last alcoholic beverage was about 1 month ago.  She is retired and previously worked as a Psychologist, prison and probation services.  ROS: Constitutional: no weight change, no fever ENT/Mouth: no sore throat, no rhinorrhea Eyes: no eye pain, no vision changes Cardiovascular: no chest pain, no dyspnea,  no edema, no palpitations Respiratory: + cough, + yellow sputum, no wheezing Gastrointestinal: no nausea, no vomiting, no diarrhea, no constipation Genitourinary: no urinary incontinence, no dysuria, no hematuria Musculoskeletal: no arthralgias, no myalgias Skin: no skin lesions, no pruritus, Neuro: + weakness, no loss of consciousness, no syncope Psych: no anxiety, no depression, + decrease appetite Heme/Lymph: no bruising, no bleeding  ED Course: Discussed with EDP, patient requiring hospitalization for chief concerns of pneumonia.  Assessment/Plan  Principal Problem:   Community acquired pneumonia Active Problems:   Hyperkalemia   SOB (shortness of breath)   Gastroesophageal reflux disease   AKI (acute kidney injury)   Orthostatic hypotension   Former smoker   Complete heart block s/p pacemaker (HCC)   Seizures (HCC)   Non-small cell cancer of right lung (HCC)   Carotid stenosis s/p stents   Chronic anticoagulation   HIDRADENITIS SUPPURATIVA   Pacemaker-[employer]   Hyperlipidemia   Coronary artery disease involving native coronary artery of native heart without angina pectoris   Paroxysmal atrial fibrillation (HCC)   Depression   GERD without esophagitis   Carotid stenosis, symptomatic w/o infarct   Chronic obstructive pulmonary disease (HCC)   Arterial hypotension   Assessment and Plan:  * Community acquired pneumonia Completed azithromycin  500 mg IV daily, ceftriaxone  2 g IV daily  to complete a 5-day course Incentive spirometry, flutter valve DuoNebs every 6 hours as needed for shortness of breath or wheezing Midline order as patient does  not currently have peripheral IV  Orthostatic hypotension Resumed home pyridostigmine  60 mg 3 times daily, fludrocortisone  0.2 mg p.o. twice daily, midodrine  10 mg p.o. 3 times daily with meals  AKI (acute kidney injury) With no prior CKD Status post sodium chloride  1 L bolus per EDP Continue with NS 1 L bolus on admission followed by NS infusion at 125 mL/h, 1 day ordered Strict I's and O's Recheck BMP in the a.m.  Hyperkalemia Secondary to acute kidney injury, strict I's and O's Continue with sodium chloride  infusion at 125 mL/h, 1 day ordered  Chronic anticoagulation Resumed home Eliquis  5 mg p.o. twice daily  Carotid stenosis s/p stents Resumed home Eliquis  5 mg p.o. twice daily  Seizures (HCC) Resumed home Keppra  250 mg p.o. twice daily  Chart reviewed.   DVT prophylaxis: Eliquis  Code Status: DNR/DNI Diet: Renal Family Communication: Updated sister, Sari at bedside with patient's permission Disposition Plan: Pending clinical course Consults called: Midline order, RN Admission status: Telemetry medical, inpatient  Past Medical History:  Diagnosis Date   Allergic rhinitis    Arthritis    Asthma    as a child, mild now   Breast cancer (HCC) 12/2015   right breast cancer, lumpectomy and mammosite    Cardiac pacemaker in situ    Colon polyps    colonoscopy 7/08, tubular adenoma   Complete heart block (HCC) 09/2011   s/p PPM implanted in [place] [facility]   COPD (chronic obstructive pulmonary disease) (HCC)    Myocardial infarction (HCC) 2011   Orthostatic hypotension    Osteopenia 10/2015   Pacemaker    2011   Paroxysmal atrial fibrillation (HCC) 03/2021   Incidentally detected on pacemaker interrogation   Personal history of radiation therapy 2017   right breast ca, mammosite placed   Rotator cuff tear, right 02/2022   Had an MRI in [place] I have 2 tears.   Seizure disorder (HCC)    Seizures (HCC)    first one was when she was 73 years old    Small bowel  obstruction (HCC)    1988 and 2002   Tobacco abuse    Past Surgical History:  Procedure Laterality Date   ABDOMINAL HYSTERECTOMY     BREAST BIOPSY Right 2007   benign inflammatory changes, mass due to underwire bra   BREAST BIOPSY Left 01/02/2016   columnar cell changes without atypical hyperplasia.   BREAST BIOPSY Right 12/06/2015   rt breast mass 10:00, bx done at Dr. Gilberto office, invasive ductal carcinoma   BREAST EXCISIONAL BIOPSY Left 01/02/2016   COLUMNAR CELL CHANGE AND HYPERPLASIA ASSOCIATED WITH LUMINAL AND STROMAL CALCIFICATIONS   BREAST LUMPECTOMY Right 01/02/2016   invasive mammary carcinoma, clear margins, negative LN   BREAST LUMPECTOMY WITH SENTINEL LYMPH NODE BIOPSY Right 01/02/2016   pT1c, N0; ER/ PR 100%; Her 2 neu not over expressed: BREAST LUMPECTOMY WITH SENTINEL LYMPH NODE BX;  Surgeon: Reyes LELON Cota, MD;  Location: [facility] ORS;  Service: General;  Laterality: Right;   CARDIAC CATHETERIZATION     CAROTID PTA/STENT INTERVENTION Right 04/29/2022   Procedure: CAROTID PTA/STENT INTERVENTION;  Surgeon: Jama Cordella MATSU, MD;  Location: [facility] INVASIVE CV LAB;  Service: Cardiovascular;  Laterality: Right;   CATARACT EXTRACTION Bilateral    COLONOSCOPY  10/2015   Dr Teressa   COLONOSCOPY WITH PROPOFOL  N/A 08/19/2018  Procedure: COLONOSCOPY WITH PROPOFOL ;  Surgeon: Janalyn Keene NOVAK, MD;  Location: [facility] ENDOSCOPY;  Service: Endoscopy;  Laterality: N/A;   ESOPHAGOGASTRODUODENOSCOPY (EGD) WITH PROPOFOL  N/A 08/19/2018   Procedure: ESOPHAGOGASTRODUODENOSCOPY (EGD) WITH PROPOFOL ;  Surgeon: Janalyn Keene NOVAK, MD;  Location: [facility] ENDOSCOPY;  Service: Endoscopy;  Laterality: N/A;   EXPLORATORY LAPAROTOMY  01/25/2001   Exploratory laparotomy, lysis of adhesions, identification of internal hernia secondary to omental adhesion. Prolonged postoperative ileus.   gyn surgery  1993   hysterectomy- form endometriosis   LAPAROSCOPY     PACEMAKER INSERTION  10/24/11   [employer]vant[employer] dual chamber PPM implanted by Dr Ronny at [facility] in [place]   PPM GENERATOR CHANGEOUT N/A 12/05/2019   Procedure: PPM GENERATOR HERMA;  Surgeon: Fernande Elspeth BROCKS, MD;  Location: [facility] INVASIVE CV LAB;  Service: Cardiovascular;  Laterality: N/A;   RIGHT/LEFT HEART CATH AND CORONARY ANGIOGRAPHY Bilateral 07/19/2019   Procedure: RIGHT/LEFT HEART CATH AND CORONARY ANGIOGRAPHY;  Surgeon: Mady Bruckner, MD;  Location: [facility] INVASIVE CV LAB;  Service: Cardiovascular;  Laterality: Bilateral;   TEMPORARY PACEMAKER N/A 12/05/2019   Procedure: TEMPORARY PACEMAKER;  Surgeon: Fernande Elspeth BROCKS, MD;  Location: [facility] INVASIVE CV LAB;  Service: Cardiovascular;  Laterality: N/A;   Social History:  reports that she quit smoking about 13 years ago. Her smoking use included cigarettes. She started smoking about 55 years ago. She has a 63 pack-year smoking history. She has never used smokeless tobacco. She reports current alcohol use of about 1.0 - 2.0 standard drink of alcohol per week. She reports that she does not use drugs.  Allergies  Allergen Reactions   Codeine Nausea And Vomiting   Morphine  And Codeine Nausea Only   Family History  Problem Relation Age of Onset   Stroke Mother    Heart disease Mother 58       MI and CABG   Dementia Mother    Coronary artery disease Father    Parkinsonism Father    Breast cancer Sister    Cancer - Cervical Daughter 22       died 04-Apr-2023   Heart attack Brother 65   Colon cancer Neg Hx    Family history: Family history reviewed and not pertinent.  Prior to Admission medications   Medication Sig Start Date End Date Taking? Authorizing Provider  acetaminophen  (TYLENOL ) 500 MG tablet Take 500-1,000 mg by mouth every 6 (six) hours as needed for mild pain or headache.    Yes [provider]  albuterol  (VENTOLIN  HFA) 108 (90 Base) MCG/ACT inhaler INHALE 2 PUFFS BY MOUTH EVERY 4 HOURS AS NEEDED FOR WHEEZING OR SHORTNESS  OF BREATH 05/25/23  Yes Tamea Dedra CROME, MD  apixaban  (ELIQUIS ) 5 MG TABS tablet Take 1 tablet (5 mg total) by mouth 2 (two) times daily. 06/17/23  Yes Fernande Elspeth BROCKS, MD  aspirin  EC 81 MG tablet Take 1 tablet (81 mg total) by mouth daily. 12/05/22  Yes Schnier, Cordella MATSU, MD  atorvastatin  (LIPITOR) 40 MG tablet Take 1 tablet by mouth once daily 02/13/23  Yes Fernande Elspeth BROCKS, MD  benzonatate  (TESSALON ) 200 MG capsule Take 1 capsule (200 mg total) by mouth 2 (two) times daily as needed for cough. 03/03/23  Yes Bedsole, Wrenn Willcox E, MD  Cholecalciferol  (VITAMIN D3) 50 MCG (2000 UT) capsule Take 2,000 Units by mouth daily.   Yes [provider]  FLUoxetine  (PROZAC ) 20 MG capsule Take 1 capsule by mouth once daily 06/23/23  Yes Tower, [place]  A, MD  furosemide  (LASIX ) 40 MG tablet Take 40 mg by mouth daily as needed.   Yes [provider]  gabapentin  (NEURONTIN ) 100 MG capsule Take 2 capsules (200 mg total) by mouth 2 (two) times daily as needed. 10/04/23  Yes Beamer, Richerd, MD  ipratropium-albuterol  (DUONEB) 0.5-2.5 (3) MG/3ML SOLN Take 3 mLs by nebulization every 6 (six) hours as needed. 10/30/23  Yes Tamea Dedra CROME, MD  iron  polysaccharides (NIFEREX) 150 MG capsule Take 1 capsule (150 mg total) by mouth daily. 11/02/23  Yes Tower, Laine LABOR, MD  levETIRAcetam  (KEPPRA ) 250 MG tablet Take 250 mg by mouth 2 (two) times daily.  11/02/19  Yes [provider]  midodrine  (PROAMATINE ) 10 MG tablet Take 1 tablet (10 mg total) by mouth 3 (three) times daily with meals. 10/29/23  Yes Tower, Laine LABOR, MD  ondansetron  (ZOFRAN -ODT) 4 MG disintegrating tablet Take 1 tablet (4 mg total) by mouth every 8 (eight) hours as needed for nausea or vomiting. 11/18/23  Yes Mumma, Clotilda, MD  pantoprazole  (PROTONIX ) 40 MG tablet Take 1 tablet (40 mg total) by mouth daily. 11/20/22 11/23/23 Yes Tamea Dedra CROME, MD  potassium chloride  (KLOR-CON ) 10 MEQ tablet Take 3 tablets (30 mEq total) by mouth daily. 10/29/23  Yes  Tower, Laine LABOR, MD  pyridostigmine  (MESTINON ) 60 MG tablet Take 1 tablet (60 mg total) by mouth 3 (three) times daily. 09/25/23  Yes Furth, Cadence H, PA-C  zolpidem  (AMBIEN ) 10 MG tablet Take 5-10 mg by mouth at bedtime as needed. 11/05/23  Yes [provider]  amoxicillin -clavulanate (AUGMENTIN ) 875-125 MG tablet Take 1 tablet by mouth 2 (two) times daily. Patient not taking: Reported on 11/23/2023 10/29/23   Tower, Laine LABOR, MD  cephALEXin  (KEFLEX ) 500 MG capsule Take 1 capsule (500 mg total) by mouth 2 (two) times daily for 5 days. Patient not taking: Reported on 11/23/2023 11/18/23 11/23/23  Suzanne Clotilda, MD  melatonin 3 MG TABS tablet Take 3 mg by mouth at bedtime. Patient not taking: Reported on 11/23/2023    [provider]  Nebulizers (COMPRESSOR/NEBULIZER) MISC For use with medication for the lungs.  Use as directed. 10/30/23   Tamea Dedra CROME, MD  OXYGEN  Inhale 4 L into the lungs continuous.    [provider]   Physical Exam: Vitals:   11/23/23 0916 11/23/23 0917 11/23/23 1000 11/23/23 1100  BP: 122/68  (!) 141/78 (!) 144/75  Pulse: 87  83 76  Resp: 17  14 (!) 24  Temp:  98.2 F (36.8 C)    TempSrc:  Axillary    SpO2: 94%  93% 93%  Weight:      Height:       Constitutional: appears appropriate Eyes: PERRL, lids and conjunctivae normal ENMT: Mucous membranes are moist. Posterior pharynx clear of any exudate or lesions. Age-appropriate dentition. Hearing appropriate Neck: normal, supple, no masses, no thyromegaly Respiratory: Decreased lung sounds in the mid /left lower lung regions, no wheezing, no crackles. Normal respiratory effort. No accessory muscle use.  Cardiovascular: Regular rate and rhythm, no murmurs / rubs / gallops. No extremity edema. 2+ pedal pulses. No carotid bruits.  Abdomen: no tenderness, no masses palpated, no hepatosplenomegaly. Bowel sounds positive.  Musculoskeletal: no clubbing / cyanosis. No joint deformity upper and lower  extremities. Good ROM, no contractures, no atrophy. Normal muscle tone.  Skin: no rashes, lesions, ulcers. No induration Neurologic: Sensation intact. Strength 5/5 in all 4.  Psychiatric: Normal judgment and insight. Alert and oriented x 3.  Normal mood.   EKG: independently reviewed, showing sinus rhythm with rate of 86, QTc 540  Chest x-ray on Admission: I personally reviewed and I agree with radiologist reading as below.  DG Chest Portable 1 View Result Date: 11/23/2023 CLINICAL DATA:  73 year old female with weakness. EXAM: PORTABLE CHEST 1 VIEW COMPARISON:  Chest radiographs 10/29/2023 and earlier. FINDINGS: Portable AP upright view at 1034 hours. Emphysema by CTA in July. Chronic curvilinear scarring at the level of the right hilum appears unchanged since that time. Streaky left perihilar opacity is increased from the July CTA, but regressed from earlier this month. Stable left chest pacemaker. Stable cardiac size and mediastinal contours. Calcified aortic atherosclerosis. Visualized tracheal air column is within normal limits. No pneumothorax or pulmonary edema. No pleural effusion or other confluent lung opacity. IMPRESSION: Emphysema (ICD10-J43.9). Streaky left perihilar lung opacity regressed but not fully resolved since 10/29/2023. Consider unresolved infectious exacerbation. Stable curvilinear right perihilar lung scarring since July. Electronically Signed   By: VEAR Hurst M.D.   On: 11/23/2023 10:59   Labs on Admission: I have personally reviewed following labs  CBC: Recent Labs  Lab 11/18/23 1407 11/23/23 0926  WBC 8.0 14.7*  NEUTROABS  --  12.4*  HGB 12.9 14.3  HCT 42.3 45.6  MCV 85.3 83.8  PLT 437* 421*   Basic Metabolic Panel: Recent Labs  Lab 11/18/23 1407 11/23/23 0926  NA 134* 136  K 4.7 5.2*  CL 102 98  CO2 22 24  GLUCOSE 111* 123*  BUN 22 28*  CREATININE 1.15* 1.42*  CALCIUM  9.9 10.1   GFR: Estimated Creatinine Clearance: 40 mL/min (A) (by C-G formula based  on SCr of 1.42 mg/dL (H)).  Liver Function Tests: Recent Labs  Lab 11/18/23 1407 11/23/23 0926  AST 20 26  ALT 13 17  ALKPHOS 92 96  BILITOT 0.9 2.0*  PROT 7.6 8.5*  ALBUMIN 4.0 4.5   Coagulation Profile: Recent Labs  Lab 11/18/23 1407  INR 1.4*   Urine analysis:    Component Value Date/Time   COLORURINE YELLOW (A) 11/18/2023 1743   APPEARANCEUR HAZY (A) 11/18/2023 1743   APPEARANCEUR Clear 04/01/2013 0815   LABSPEC 1.014 11/18/2023 1743   LABSPEC >1.060 04/01/2013 0815   PHURINE 5.0 11/18/2023 1743   GLUCOSEU NEGATIVE 11/18/2023 1743   GLUCOSEU NEGATIVE 05/05/2023 1410   HGBUR SMALL (A) 11/18/2023 1743   BILIRUBINUR NEGATIVE 11/18/2023 1743   BILIRUBINUR Negative 02/24/2023 0951   BILIRUBINUR Negative 04/01/2013 0815   KETONESUR NEGATIVE 11/18/2023 1743   PROTEINUR NEGATIVE 11/18/2023 1743   UROBILINOGEN 0.2 05/05/2023 1410   NITRITE NEGATIVE 11/18/2023 1743   LEUKOCYTESUR TRACE (A) 11/18/2023 1743   LEUKOCYTESUR Negative 04/01/2013 0815   This document was prepared using Dragon Voice Recognition software and may include unintentional dictation errors.  Dr. Sherre [facility]  If 7PM-7AM, please contact overnight-coverage provider If 7AM-7PM, please contact day attending provider [email or web address]  11/23/2023, 4:12 PM

## 2023-11-23 NOTE — Sepsis Progress Note (Signed)
 Elink monitoring for the code sepsis protocol.

## 2023-11-23 NOTE — ED Notes (Signed)
 Patient provided cup of water per request; ok given per EDP, Ray.

## 2023-11-23 NOTE — ED Notes (Signed)
 Lab at bedside; only able to collect single [facility]; also made multiple attempts.  Second set of BC's and Lactic Acid remain uncollected at this time.

## 2023-11-24 DIAGNOSIS — R112 Nausea with vomiting, unspecified: Secondary | ICD-10-CM | POA: Diagnosis not present

## 2023-11-24 DIAGNOSIS — J189 Pneumonia, unspecified organism: Secondary | ICD-10-CM | POA: Diagnosis not present

## 2023-11-24 LAB — CBC
HCT: 35.5 % — ABNORMAL LOW (ref 36.0–46.0)
Hemoglobin: 11 g/dL — ABNORMAL LOW (ref 12.0–15.0)
MCH: 26.3 pg (ref 26.0–34.0)
MCHC: 31 g/dL (ref 30.0–36.0)
MCV: 84.9 fL (ref 80.0–100.0)
Platelets: 286 K/uL (ref 150–400)
RBC: 4.18 MIL/uL (ref 3.87–5.11)
RDW: 15.7 % — ABNORMAL HIGH (ref 11.5–15.5)
WBC: 6.8 K/uL (ref 4.0–10.5)
nRBC: 0 % (ref 0.0–0.2)

## 2023-11-24 LAB — PROTIME-INR
INR: 1.5 — ABNORMAL HIGH (ref 0.8–1.2)
Prothrombin Time: 18.6 s — ABNORMAL HIGH (ref 11.4–15.2)

## 2023-11-24 LAB — BASIC METABOLIC PANEL WITH GFR
Anion gap: 8 (ref 5–15)
BUN: 25 mg/dL — ABNORMAL HIGH (ref 8–23)
CO2: 24 mmol/L (ref 22–32)
Calcium: 8.9 mg/dL (ref 8.9–10.3)
Chloride: 103 mmol/L (ref 98–111)
Creatinine, Ser: 0.92 mg/dL (ref 0.44–1.00)
GFR, Estimated: >60 mL/min (ref >60)
Glucose, Bld: 94 mg/dL (ref 70–99)
Potassium: 3.5 mmol/L (ref 3.5–5.1)
Sodium: 135 mmol/L (ref 135–145)

## 2023-11-24 LAB — CORTISOL-AM, BLOOD: Cortisol - AM: 5.2 ug/dL — ABNORMAL LOW (ref 6.7–22.6)

## 2023-11-24 NOTE — Discharge Summary (Signed)
 Diana Cook FMW:[id number] DOB: 04/05/1950 DOA: 11/23/2023  PCP: Randeen Laine LABOR, MD  Admit date: 11/23/2023 Discharge date: 11/24/2023  Time spent: 35 minutes  Recommendations for Outpatient Follow-up:  Pcp f/u next week     Discharge Diagnoses:  Principal Problem:   Nausea & vomiting Active Problems:   Hyperkalemia   SOB (shortness of breath)   Gastroesophageal reflux disease   AKI (acute kidney injury)   Orthostatic hypotension   Former smoker   Complete heart block s/p pacemaker (HCC)   Seizures (HCC)   Non-small cell cancer of right lung (HCC)   Carotid stenosis s/p stents   Chronic anticoagulation   HIDRADENITIS SUPPURATIVA   Pacemaker-[employer]   Hyperlipidemia   Coronary artery disease involving native coronary artery of native heart without angina pectoris   Paroxysmal atrial fibrillation (HCC)   Depression   GERD without esophagitis   Carotid stenosis, symptomatic w/o infarct   Chronic obstructive pulmonary disease (HCC)   Chronic heart failure with preserved ejection fraction (HFpEF) (HCC)   Chronic hypoxic respiratory failure (HCC)   Sleep apnea   Arterial hypotension   Discharge Condition: improved  Diet recommendation: regular  Filed Weights   11/23/23 0915 11/24/23 0535  Weight: 86 kg 84.7 kg    History of present illness:  From admission h and p [name] is a 73 year old female with history of CAD, COPD on chronic 4 L nasal cannula, complete heart block status post pacemaker, carotid stenosis status post stent placement and on Eliquis , history of seizure, brittle orthostatic hypotension requiring multiple medications, who presents ED for chief concerns of hypotension, nausea, dizziness, new onset productive cough.   Vitals in the ED showed t 98.2, rr 14, hr 83, blood pressure 141/78, SpO2 93% on 4 L nasal cannula.   Serum sodium is 136, potassium 5.2, chloride 98, bicarb 24, BUN of 28, serum creatinine 1.42, eGFR 39, nonfasting  blood glucose 123, WBC 14.7, hemoglobin 14.3, platelets of 421.   HS troponin is 68 and on repeat is 60.   T. bili is 2.0.  AST/ALT were within normal limits.   Blood cultures x 2 ordered and pending collection, lactic acid has been ordered and pending collection, UA has been ordered and pending collection   ED treatment: Azithromycin  500 mg IV one-time dose, ceftriaxone  2 g IV one-time dose, sodium chloride  1 L bolus. ----------------------------------------- At bedside, patient able to tell me her first and last name, age, location, current calendar year.   She reports that over the last 2 days, she has had increased productive cough that is yellow sputum.  She denies fever, chills, chest pain, shortness of breath, dysuria, hematuria.   She endorses nausea and vomiting of clear fluid over the last 2 days in addition to abdominal discomfort.   She denies trauma to her person.  Hospital Course:   Despite what's recounted in the h and p, patient shares to hospital day 1 provider that her only reason for coming to the ER is nausea and vomiting. No abdominal pain, vomitus is nbnb. No dysuria. Here labs show leukocytosis and AKI. CXR showed possible infiltrate but no fevers and patient denies cough or dyspnea, says her breathing is very stable and there have been no changes from her baseline. She reports her nausea is resolved and last episode of vomiting was yesterday morning. Stooling is normal, no diarrhea. Here diet was advanced and she is tolerating food and drink, and she reports feeling back to baseline. Advise close  pcp f/u.  Procedures: none   Consultations: none  Discharge Exam: Vitals:   11/24/23 0424 11/24/23 0743  BP: (!) 142/66 (!) 150/59  Pulse: 64 64  Resp: 18 16  Temp: 97.8 F (36.6 C) (!) 97.5 F (36.4 C)  SpO2: 91% 91%    General: NAD Cardiovascular: RRR Respiratory: few rhonchi otherwise clear, normal wob, no tachypnea Abdomen: soft, non-tender  Discharge  Instructions   Discharge Instructions     Diet general   Complete by: As directed    Increase activity slowly   Complete by: As directed       Allergies as of 11/24/2023       Reactions   Codeine Nausea And Vomiting   Morphine  And Codeine Nausea Only        Medication List     STOP taking these medications    amoxicillin -clavulanate 875-125 MG tablet Commonly known as: AUGMENTIN    cephALEXin  500 MG capsule Commonly known as: KEFLEX    melatonin 3 MG Tabs tablet       TAKE these medications    acetaminophen  500 MG tablet Commonly known as: TYLENOL  Take 500-1,000 mg by mouth every 6 (six) hours as needed for mild pain or headache.   albuterol  108 (90 Base) MCG/ACT inhaler Commonly known as: VENTOLIN  HFA INHALE 2 PUFFS BY MOUTH EVERY 4 HOURS AS NEEDED FOR WHEEZING OR SHORTNESS OF BREATH   apixaban  5 MG Tabs tablet Commonly known as: Eliquis  Take 1 tablet (5 mg total) by mouth 2 (two) times daily.   aspirin  EC 81 MG tablet Take 1 tablet (81 mg total) by mouth daily.   atorvastatin  40 MG tablet Commonly known as: LIPITOR Take 1 tablet by mouth once daily   benzonatate  200 MG capsule Commonly known as: TESSALON  Take 1 capsule (200 mg total) by mouth 2 (two) times daily as needed for cough.   FLUoxetine  20 MG capsule Commonly known as: PROZAC  Take 1 capsule by mouth once daily   furosemide  40 MG tablet Commonly known as: LASIX  Take 40 mg by mouth daily as needed.   gabapentin  100 MG capsule Commonly known as: NEURONTIN  Take 2 capsules (200 mg total) by mouth 2 (two) times daily as needed.   ipratropium-albuterol  0.5-2.5 (3) MG/3ML Soln Commonly known as: DUONEB Take 3 mLs by nebulization every 6 (six) hours as needed.   iron  polysaccharides 150 MG capsule Commonly known as: NIFEREX Take 1 capsule (150 mg total) by mouth daily.   levETIRAcetam  250 MG tablet Commonly known as: KEPPRA  Take 250 mg by mouth 2 (two) times daily.   MEDNEB  Nebuliz-Reuse-Disp Kit Misc For use with medication for the lungs.  Use as directed.   midodrine  10 MG tablet Commonly known as: PROAMATINE  Take 1 tablet (10 mg total) by mouth 3 (three) times daily with meals.   ondansetron  4 MG disintegrating tablet Commonly known as: ZOFRAN -ODT Take 1 tablet (4 mg total) by mouth every 8 (eight) hours as needed for nausea or vomiting.   OXYGEN  Inhale 4 L into the lungs continuous.   pantoprazole  40 MG tablet Commonly known as: Protonix  Take 1 tablet (40 mg total) by mouth daily.   potassium chloride  10 MEQ tablet Commonly known as: KLOR-CON  Take 3 tablets (30 mEq total) by mouth daily.   pyridostigmine  60 MG tablet Commonly known as: Mestinon  Take 1 tablet (60 mg total) by mouth 3 (three) times daily.   Vitamin D3 50 MCG (2000 UT) capsule Take 2,000 Units by mouth daily.  zolpidem  10 MG tablet Commonly known as: AMBIEN  Take 5-10 mg by mouth at bedtime as needed.       Allergies  Allergen Reactions   Codeine Nausea And Vomiting   Morphine  And Codeine Nausea Only    Follow-up Information     Tower, Laine LABOR, MD Follow up.   Specialties: Family Medicine, Radiology Contact information: [address] [place] [address] [phone]                  The results of significant diagnostics from this hospitalization (including imaging, microbiology, ancillary and laboratory) are listed below for reference.    Significant Diagnostic Studies: DG Chest Portable 1 View Result Date: 11/23/2023 CLINICAL DATA:  73 year old female with weakness. EXAM: PORTABLE CHEST 1 VIEW COMPARISON:  Chest radiographs 10/29/2023 and earlier. FINDINGS: Portable AP upright view at 1034 hours. Emphysema by CTA in July. Chronic curvilinear scarring at the level of the right hilum appears unchanged since that time. Streaky left perihilar opacity is increased from the July CTA, but regressed from earlier this month. Stable left chest  pacemaker. Stable cardiac size and mediastinal contours. Calcified aortic atherosclerosis. Visualized tracheal air column is within normal limits. No pneumothorax or pulmonary edema. No pleural effusion or other confluent lung opacity. IMPRESSION: Emphysema (ICD10-J43.9). Streaky left perihilar lung opacity regressed but not fully resolved since 10/29/2023. Consider unresolved infectious exacerbation. Stable curvilinear right perihilar lung scarring since July. Electronically Signed   By: VEAR Hurst M.D.   On: 11/23/2023 10:59   DG Chest 2 View Result Date: 10/29/2023 CLINICAL DATA:  Nonproductive cough EXAM: CHEST - 2 VIEW COMPARISON:  Chest radiograph dated 08/28/2023 FINDINGS: Left chest wall pacemaker leads project over the right atrium and ventricle. Normal lung volumes. Increased platelike density in the right mid lung. New ovoid opacity in the left lower lobe. Trace blunting of bilateral costophrenic angles. No pneumothorax. Similar mildly enlarged cardiomediastinal silhouette. No acute osseous abnormality. IMPRESSION: 1. Increased platelike density in the right mid lung, likely atelectasis. 2. New ovoid opacity in the left lower lobe, which may represent atelectasis or pneumonia. Recommend follow-up chest radiograph in 8-12 weeks to ensure resolution. 3. Trace blunting of bilateral costophrenic angles, which may represent trace pleural effusions. Electronically Signed   By: Limin  Xu M.D.   On: 10/29/2023 13:25   CUP PACEART INCLINIC DEVICE CHECK Result Date: 10/27/2023 Normal in-clinic _dual__ chamber pacemaker check. Presenting Rhythm: _AP-VP__ . Routine testing of thresholds, sensing, and impedance demonstrate stable parameters and no programming changes needed at this time. No episodes. Estimated longevity _10 years___ . Pt enrolled in remote follow-up. [name], NP   Microbiology: Recent Results (from the past 240 hours)  Urine Culture (for pregnant, neutropenic or urologic patients or patients  with an indwelling urinary catheter)     Status: Abnormal   Collection Time: 11/18/23  5:43 PM   Specimen: Urine, Clean Catch  Result Value Ref Range Status   Specimen Description   Final    URINE, CLEAN CATCH Performed at [facility], [address], [place], [place] [address]    Special Requests   Final    NONE Performed at [facility], [address] [place], [place] [address]    Culture (A)  Final    <10,000 COLONIES/mL INSIGNIFICANT GROWTH Performed at [facility], [address] [place], [place] [address]    Report Status 11/19/2023 FINAL  Final  Blood Culture (routine x 2)  Status: None (Preliminary result)   Collection Time: 11/23/23  3:10 PM   Specimen: BLOOD  Result Value Ref Range Status   Specimen Description BLOOD BLOOD RIGHT ARM  Final   Special Requests   Final    BOTTLES DRAWN AEROBIC ONLY Blood Culture results may not be optimal due to an inadequate volume of blood received in culture bottles   Culture   Final    NO GROWTH < 24 HOURS Performed at [facility], [address] [place], [place] [address]    Report Status PENDING  Incomplete  Resp panel by RT-PCR (RSV, Flu A&B, Covid) Anterior Nasal Swab     Status: None   Collection Time: 11/23/23  4:38 PM   Specimen: Anterior Nasal Swab  Result Value Ref Range Status   SARS Coronavirus 2 by RT PCR NEGATIVE NEGATIVE Final    Comment: (NOTE) SARS-CoV-2 target nucleic acids are NOT DETECTED.  The SARS-CoV-2 RNA is generally detectable in upper respiratory specimens during the acute phase of infection. The lowest concentration of SARS-CoV-2 viral copies this assay can detect is 138 copies/mL. A negative result does not preclude SARS-Cov-2 infection and should not be used as the sole basis for treatment or other patient management decisions. A negative result may occur with  improper specimen collection/handling, submission of specimen other than nasopharyngeal swab,  presence of viral mutation(s) within the areas targeted by this assay, and inadequate number of viral copies(<138 copies/mL). A negative result must be combined with clinical observations, patient history, and epidemiological information. The expected result is Negative.  Fact Sheet for Patients:  BloggerCourse.com  Fact Sheet for Healthcare Providers:  SeriousBroker.it  This test is no t yet approved or cleared by the [place]  FDA and  has been authorized for detection and/or diagnosis of SARS-CoV-2 by FDA under an Emergency Use Authorization (EUA). This EUA will remain  in effect (meaning this test can be used) for the duration of the COVID-19 declaration under Section 564(b)(1) of the Act, 21 U.S.C.section 360bbb-3(b)(1), unless the authorization is terminated  or revoked sooner.       Influenza A by PCR NEGATIVE NEGATIVE Final   Influenza B by PCR NEGATIVE NEGATIVE Final    Comment: (NOTE) The Xpert Xpress SARS-CoV-2/FLU/RSV plus assay is intended as an aid in the diagnosis of influenza from Nasopharyngeal swab specimens and should not be used as a sole basis for treatment. Nasal washings and aspirates are unacceptable for Xpert Xpress SARS-CoV-2/FLU/RSV testing.  Fact Sheet for Patients: BloggerCourse.com  Fact Sheet for Healthcare Providers: SeriousBroker.it  This test is not yet approved or cleared by the [place]  FDA and has been authorized for detection and/or diagnosis of SARS-CoV-2 by FDA under an Emergency Use Authorization (EUA). This EUA will remain in effect (meaning this test can be used) for the duration of the COVID-19 declaration under Section 564(b)(1) of the Act, 21 U.S.C. section 360bbb-3(b)(1), unless the authorization is terminated or revoked.     Resp Syncytial Virus by PCR NEGATIVE NEGATIVE Final    Comment: (NOTE) Fact Sheet for  Patients: BloggerCourse.com  Fact Sheet for Healthcare Providers: SeriousBroker.it  This test is not yet approved or cleared by the [place]  FDA and has been authorized for detection and/or diagnosis of SARS-CoV-2 by FDA under an Emergency Use Authorization (EUA). This EUA will remain in effect (meaning this test can be used) for the duration of the COVID-19 declaration under Section 564(b)(1) of the Act, 21 U.S.C. section 360bbb-3(b)(1), unless the authorization  is terminated or revoked.  Performed at [facility], [address] [place], [place] [address]   Blood Culture (routine x 2)     Status: None (Preliminary result)   Collection Time: 11/23/23  9:20 PM   Specimen: BLOOD  Result Value Ref Range Status   Specimen Description BLOOD BLOOD RIGHT HAND  Final   Special Requests   Final    BOTTLES DRAWN AEROBIC AND ANAEROBIC Blood Culture results may not be optimal due to an inadequate volume of blood received in culture bottles   Culture   Final    NO GROWTH < 12 HOURS Performed at [facility], [address] [place], [place] [address]    Report Status PENDING  Incomplete     Labs: Basic Metabolic Panel: Recent Labs  Lab 11/18/23 1407 11/23/23 0926 11/24/23 0555  NA 134* 136 135  K 4.7 5.2* 3.5  CL 102 98 103  CO2 22 24 24   GLUCOSE 111* 123* 94  BUN 22 28* 25*  CREATININE 1.15* 1.42* 0.92  CALCIUM  9.9 10.1 8.9   Liver Function Tests: Recent Labs  Lab 11/18/23 1407 11/23/23 0926  AST 20 26  ALT 13 17  ALKPHOS 92 96  BILITOT 0.9 2.0*  PROT 7.6 8.5*  ALBUMIN 4.0 4.5   Recent Labs  Lab 11/23/23 1220  LIPASE 15   No results for input(s): AMMONIA in the last 168 hours. CBC: Recent Labs  Lab 11/18/23 1407 11/23/23 0926 11/24/23 0555  WBC 8.0 14.7* 6.8  NEUTROABS  --  12.4*  --   HGB 12.9 14.3 11.0*  HCT 42.3 45.6 35.5*  MCV 85.3 83.8 84.9  PLT 437* 421* 286   Cardiac  Enzymes: No results for input(s): CKTOTAL, CKMB, CKMBINDEX, TROPONINI in the last 168 hours. BNP: BNP (last 3 results) No results for input(s): BNP in the last 8760 hours.  ProBNP (last 3 results) No results for input(s): PROBNP in the last 8760 hours.  CBG: No results for input(s): GLUCAP in the last 168 hours.     Signed:  Devaughn KATHEE Ban MD.  [facility] 11/24/2023, 1:09 PM

## 2023-11-26 ENCOUNTER — Ambulatory Visit: Admitting: Pulmonary Disease

## 2023-11-26 ENCOUNTER — Telehealth: Payer: Self-pay

## 2023-11-26 NOTE — Transitions of Care (Post Inpatient/ED Visit) (Signed)
 11/26/2023  Name: Diana Cook MRN: [id number] DOB: 1950-06-02  Today's TOC FU Call Status: Today's TOC FU Call Status:: Successful TOC FU Call Completed TOC FU Call Complete Date: 11/26/23 Patient's Name and Date of Birth confirmed.  Transition Care Management Follow-up Telephone Call Date of Discharge: 11/24/23 Discharge Facility: [facility] Type of Discharge: Inpatient Admission Primary Inpatient Discharge Diagnosis:: Nausea & vomiting How have you been since you were released from the hospital?: Same Any questions or concerns?: Yes Patient Questions/Concerns:: patient is scheduled for Hospital follow up on 11/30/23 but would like to be seen quicker. I have rescheduled patient to first avilable appointment on 11/27/23 with Dr. Randeen. Patient Questions/Concerns Addressed: Notified Provider of Patient Questions/Concerns  Items Reviewed: Medications obtained,verified, and reconciled?: Yes (Medications Reviewed) Any new allergies since your discharge?: No Dietary orders reviewed?: NA Do you have support at home?: Yes People in Home [RPT]: spouse  Medications Reviewed Today: Medications Reviewed Today     Reviewed by Lang Avelina PARAS, CMA (Certified Medical Assistant) on 11/26/23 at 1013  Med List Status: <None>   Medication Order Taking? Sig Documenting Provider Last Dose Status Informant  acetaminophen  (TYLENOL ) 500 MG tablet [id number] No Take 500-1,000 mg by mouth every 6 (six) hours as needed for mild pain or headache.  [provider] Unknown Active Self           Med Note SIGRID, TIFFANY N   Fri Aug 28, 2023 11:36 PM) PRN  albuterol  (VENTOLIN  HFA) 108 (90 Base) MCG/ACT inhaler [id number] No INHALE 2 PUFFS BY MOUTH EVERY 4 HOURS AS NEEDED FOR WHEEZING OR SHORTNESS OF SHERIDA Tamea Dedra LITTIE, MD Unknown Active Self           Med Note SIGRID, TIFFANY N   Fri Aug 28, 2023 11:36 PM) PRN  apixaban  (ELIQUIS ) 5 MG TABS tablet [id number] No  Take 1 tablet (5 mg total) by mouth 2 (two) times daily. Fernande Elspeth JAYSON, MD Past Week Active Self  aspirin  EC 81 MG tablet [id number] No Take 1 tablet (81 mg total) by mouth daily. Schnier, Cordella MATSU, MD Past Week Active Self  atorvastatin  (LIPITOR) 40 MG tablet [id number] No Take 1 tablet by mouth once daily Fernande Elspeth JAYSON, MD Past Week Active Self  benzonatate  (TESSALON ) 200 MG capsule [id number] No Take 1 capsule (200 mg total) by mouth 2 (two) times daily as needed for cough. Avelina Greig BRAVO, MD Past Week Active Self           Med Note SIGRID, TIFFANY N   Fri Aug 28, 2023 11:41 PM) PRN  Cholecalciferol  (VITAMIN D3) 50 MCG (2000 UT) capsule [id number] No Take 2,000 Units by mouth daily. [provider] Past Week Active Self  FLUoxetine  (PROZAC ) 20 MG capsule [id number] No Take 1 capsule by mouth once daily Tower, Laine LABOR, MD Past Week Active Self  furosemide  (LASIX ) 40 MG tablet [id number] No Take 40 mg by mouth daily as needed. [provider] Past Week Active   gabapentin  (NEURONTIN ) 100 MG capsule [id number] No Take 2 capsules (200 mg total) by mouth 2 (two) times daily as needed. Abdul Fine, MD Unknown Active   ipratropium-albuterol  (DUONEB) 0.5-2.5 (3) MG/3ML SOLN [id number] No Take 3 mLs by nebulization every 6 (six) hours as needed. Tamea Dedra LITTIE, MD Unknown Active   iron  polysaccharides (NIFEREX) 150 MG capsule [id number] No Take 1 capsule (150 mg total) by mouth daily. Tower, Laine LABOR, MD Past Week Active   levETIRAcetam  (  KEPPRA ) 250 MG tablet [id number] No Take 250 mg by mouth 2 (two) times daily.  [provider] Past Week Active Self  midodrine  (PROAMATINE ) 10 MG tablet [id number] No Take 1 tablet (10 mg total) by mouth 3 (three) times daily with meals. Tower, Laine LABOR, MD Past Week Active   Nebulizers (COMPRESSOR/NEBULIZER) MISC [id number]  For use with medication for the lungs.  Use as directed. Tamea Dedra CROME, MD  Active   ondansetron  (ZOFRAN -ODT) 4 MG  disintegrating tablet [id number] No Take 1 tablet (4 mg total) by mouth every 8 (eight) hours as needed for nausea or vomiting. Suzanne Kirsch, MD Unknown Active   OXYGEN  [id number]  Inhale 4 L into the lungs continuous. [provider]  Active Self           Med Note CHRISTIE ALYSON Schaumann Jul 14, 2019 11:48 AM)    pantoprazole  (PROTONIX ) 40 MG tablet [id number] No Take 1 tablet (40 mg total) by mouth daily. Tamea Dedra CROME, MD Past Week Expired 11/23/23 2359 Self           Med Note ARNETA, SULIBEYA S   Fri Oct 30, 2023 11:17 AM)    potassium chloride  (KLOR-CON ) 10 MEQ tablet [id number] No Take 3 tablets (30 mEq total) by mouth daily. Tower, Laine LABOR, MD Past Week Active   pyridostigmine  (MESTINON ) 60 MG tablet [id number] No Take 1 tablet (60 mg total) by mouth 3 (three) times daily. Franchester, Cadence H, PA-C Past Week Active            Med Note ARNETA, SULIBEYA S   Fri Oct 30, 2023 11:16 AM)    zolpidem  (AMBIEN ) 10 MG tablet [id number] No Take 5-10 mg by mouth at bedtime as needed. [provider] Past Week Active             Home Care and Equipment/Supplies: Were Home Health Services Ordered?: No Any new equipment or medical supplies ordered?: No  Functional Questionnaire: Do you need assistance with bathing/showering or dressing?: No Do you need assistance with meal preparation?: No Do you need assistance with eating?: No Do you have difficulty maintaining continence: No Do you need assistance with getting out of bed/getting out of a chair/moving?: No Do you have difficulty managing or taking your medications?: No  Follow up appointments reviewed: PCP Follow-up appointment confirmed?: Yes Date of PCP follow-up appointment?: 11/27/23 Follow-up Provider: [facility] Follow-up appointment confirmed?: NA Do you need transportation to your follow-up appointment?: No Do you understand care options if your condition(s) worsen?: Yes-patient verbalized  understanding    SIGNATURE Avelina Essex, CMA (AAMA)  [facility] [phone]

## 2023-11-27 ENCOUNTER — Other Ambulatory Visit: Payer: Self-pay | Admitting: General Surgery

## 2023-11-27 ENCOUNTER — Encounter: Payer: Self-pay | Admitting: Family Medicine

## 2023-11-27 ENCOUNTER — Inpatient Hospital Stay: Admitting: Family Medicine

## 2023-11-27 ENCOUNTER — Telehealth: Payer: Self-pay | Admitting: Family Medicine

## 2023-11-27 ENCOUNTER — Telehealth: Payer: Self-pay

## 2023-11-27 ENCOUNTER — Telehealth: Payer: Self-pay | Admitting: *Deleted

## 2023-11-27 ENCOUNTER — Telehealth (INDEPENDENT_AMBULATORY_CARE_PROVIDER_SITE_OTHER): Admitting: Family Medicine

## 2023-11-27 ENCOUNTER — Ambulatory Visit: Payer: Self-pay

## 2023-11-27 VITALS — BP 110/61

## 2023-11-27 DIAGNOSIS — Z9181 History of falling: Secondary | ICD-10-CM | POA: Insufficient documentation

## 2023-11-27 DIAGNOSIS — R112 Nausea with vomiting, unspecified: Secondary | ICD-10-CM | POA: Diagnosis not present

## 2023-11-27 DIAGNOSIS — J9611 Chronic respiratory failure with hypoxia: Secondary | ICD-10-CM | POA: Diagnosis not present

## 2023-11-27 DIAGNOSIS — J189 Pneumonia, unspecified organism: Secondary | ICD-10-CM

## 2023-11-27 DIAGNOSIS — D509 Iron deficiency anemia, unspecified: Secondary | ICD-10-CM

## 2023-11-27 DIAGNOSIS — I951 Orthostatic hypotension: Secondary | ICD-10-CM | POA: Diagnosis not present

## 2023-11-27 DIAGNOSIS — N179 Acute kidney failure, unspecified: Secondary | ICD-10-CM

## 2023-11-27 DIAGNOSIS — J432 Centrilobular emphysema: Secondary | ICD-10-CM

## 2023-11-27 DIAGNOSIS — Z1231 Encounter for screening mammogram for malignant neoplasm of breast: Secondary | ICD-10-CM

## 2023-11-27 MED ORDER — FLUDROCORTISONE 0.1 MG/ML ORAL SUSPENSION: 0.2000 mg | Freq: Two times a day (BID) | ORAL | 1 refills | Status: DC

## 2023-11-27 MED ORDER — ONDANSETRON HCL 8 MG PO TABS: 8.0000 mg | ORAL_TABLET | Freq: Three times a day (TID) | ORAL | 0 refills | Status: DC | PRN

## 2023-11-27 MED ORDER — FLUDROCORTISONE 0.1 MG/ML ORAL SUSPENSION: 0.2000 mg | Freq: Two times a day (BID) | ORAL | Status: DC

## 2023-11-27 NOTE — Telephone Encounter (Signed)
 I put the referral in for Geriatric specialist    Please let us  know if you don't hear in 1-2 weeks to set that up (mychart message or call or letter)

## 2023-11-27 NOTE — Patient Instructions (Signed)
 Start back on the generic florinef  to support blood pressure  Continue other medicines   I increased zofran  from 4 to 8 mg to help nausea Be careful of sedation with this  When nausea improves-try to get in more fluids   Check on on Monday  We will want to see you in the office when you can make the trip to re check exam and labs  If any severe symptoms go to the ER If vomiting returns - ER as well

## 2023-11-27 NOTE — Telephone Encounter (Signed)
 FYI Only or Action Required?: Action required by provider: update on patient condition and refused er.  Patient was last seen in primary care on 10/29/2023 by Randeen Laine LABOR, MD.  Called Nurse Triage reporting Loss of Consciousness.  Symptoms began yesterday.  Interventions attempted: Rest, hydration, or home remedies.  Symptoms are: unchanged.  Triage Disposition: Go to ED Now (Notify PCP)  Patient/caregiver understands and will follow disposition?: No, refuses disposition   Copied from CRM [id number]. Topic: Clinical - Red Word Triage >> Nov 27, 2023  9:02 AM Robinson H wrote: [facility] that prompted transfer to Nurse Triage: Passed out last night, fell this morning getting out of bed Reason for Disposition  [1] Fainted > 15 minutes ago AND [2] still feels weak or dizzy  Answer Assessment - Initial Assessment Questions Additional info: 1) Husband calling to say he cannot get her physically to her hospital follow up appointment today at noon due to continued dizziness and recurring syncope. Requesting video visit. Symptoms are not worse, no new symptoms, persisting symptoms. Changed hospital follow up to video visit, husband is aware they may be asked to proceed to ER.  2) Reports syncope due to Orthostatic hypotension  3) States ER has washed their hand of her however they are agreeable to ER today only if she has syncope again or has another fall.  4) Requesting referral request to Dr. Orlan Jeanelle Cramp [facility] ([facility] [place] [phone]  1. ONSET: How long were you unconscious? (e.g., minutes, seconds) When did it happen?     Overnight syncope-sitting on commode as soon as she stood up she felt dizzy fell to the bed for with lose of consciousness for 10 minutes, she was back to alert and acting herself quickly after regaining consciousness. They did not seek evaluation at that time.     2. CONTENT: What happened during the period of unconsciousness? (e.g., seizure activity)       Unconscious, no seizure activity 3. MENTAL STATUS: Alert and oriented now? (e.g., oriented x 3 = name, month, location)      Alert laying in bed 4. TRIGGER: What do you think caused the fainting? What were you doing just before you fainted?  (e.g., exercise, sudden standing up, prolonged standing)     Standing or sitting 5. RECURRENT SYMPTOM: Have you ever passed out before? If Yes, ask: When was the last time? and What happened that time?      Yes, has had a few ER visit, latest was 11/23/23 6. INJURY: Did you hurt yourself when you fell?      no 7. CARDIAC SYMPTOMS: Have you had any of the following symptoms: chest pain, difficulty breathing, palpitations?     Denies  8. NEUROLOGIC SYMPTOMS: Have you had any of the following symptoms: headache, numbness, vertigo, weakness?     Weakness  9. GI SYMPTOMS: Have you had any of the following symptoms: abdomen pain, vomiting, diarrhea, blood in stools?     Denies 10. OTHER SYMPTOMS: Do you have any other symptoms?       Not eating well. Recurring presyncope. Falls with injury.  Protocols used: [employer]

## 2023-11-27 NOTE — Telephone Encounter (Signed)
 Copied from CRM [id number]. Topic: Referral - Request for Referral >> Nov 27, 2023  1:01 PM Thersia BROCKS wrote: Did the patient discuss referral with their provider in the last year? Yes (If No - schedule appointment) (If Yes - send message)  Appointment offered? Yes  Type of order/referral and detailed reason for visit: Geriatric   Preference of office, provider, location:  Orlan Jeanelle Cramp, MD, MPH [facility] [address] [place], [place] [address] Office: [phone]  Fax: [phone]  If referral order, have you been seen by this specialty before? No (If Yes, this issue or another issue? When? Where?  Can we respond through MyChart? Yes

## 2023-11-27 NOTE — Progress Notes (Signed)
 Virtual Visit via Video Note  I connected with Bryttani C Knaggs on 11/27/23 at 12:00 PM EDT by a video enabled telemedicine application and verified that I am speaking with the correct person using two identifiers.  Patient Location: Home Provider Location: Office/Clinic  I discussed the limitations, risks, security, and privacy concerns of performing an evaluation and management service by video and the availability of in person appointments. I also discussed with the patient that there may be a patient responsible charge related to this service. The patient expressed understanding and agreed to proceed.  Parties involved in encounter  Patient: Diana Cook Spouse: Elsie Cook  Provider:  Laine Balls MD   Subjective: PCP: Balls Laine LABOR, MD  Chief Complaint  Patient presents with   Hospitalization Follow-up   HPI Pt presents for follow up of hospital visit for nausea and vomiting  Leukocytosis , AKI  Cxr= possible infiltrate but no fevers and breathing had been stable   Treatment with azithro and ceftriaxone  and 1 liter fluids   Anemia of chronic disease  Lab Results  Component Value Date   WBC 6.8 11/24/2023   HGB 11.0 (L) 11/24/2023   HCT 35.5 (L) 11/24/2023   MCV 84.9 11/24/2023   PLT 286 11/24/2023   Lab Results  Component Value Date   NA 135 11/24/2023   K 3.5 11/24/2023   CO2 24 11/24/2023   GLUCOSE 94 11/24/2023   BUN 25 (H) 11/24/2023   CREATININE 0.92 11/24/2023   CALCIUM  8.9 11/24/2023   GFR 59.45 (L) 11/06/2023   EGFR 79 10/12/2023   GFRNONAA >60 11/24/2023   Lab Results  Component Value Date   ALT 17 11/23/2023   AST 26 11/23/2023   ALKPHOS 96 11/23/2023   BILITOT 2.0 (H) 11/23/2023   Cortisol low at 5.2  INR 1.5  Blood cultures negative  Urine culture insignificant growth    Hypotension  Pyridostigmine  60 mg tid Fludrocortisone  0.2 mg bid Minodrine 10 mg tid   DG Chest Portable 1 View Result Date: 11/23/2023 CLINICAL DATA:   73 year old female with weakness. EXAM: PORTABLE CHEST 1 VIEW COMPARISON:  Chest radiographs 10/29/2023 and earlier. FINDINGS: Portable AP upright view at 1034 hours. Emphysema by CTA in July. Chronic curvilinear scarring at the level of the right hilum appears unchanged since that time. Streaky left perihilar opacity is increased from the July CTA, but regressed from earlier this month. Stable left chest pacemaker. Stable cardiac size and mediastinal contours. Calcified aortic atherosclerosis. Visualized tracheal air column is within normal limits. No pneumothorax or pulmonary edema. No pleural effusion or other confluent lung opacity. IMPRESSION: Emphysema (ICD10-J43.9). Streaky left perihilar lung opacity regressed but not fully resolved since 10/29/2023. Consider unresolved infectious exacerbation. Stable curvilinear right perihilar lung scarring since July. Electronically Signed   By: VEAR Cook M.D.   On: 11/23/2023 10:59   DG Chest 2 View Result Date: 10/29/2023 CLINICAL DATA:  Nonproductive cough EXAM: CHEST - 2 VIEW COMPARISON:  Chest radiograph dated 08/28/2023 FINDINGS: Left chest wall pacemaker leads project over the right atrium and ventricle. Normal lung volumes. Increased platelike density in the right mid lung. New ovoid opacity in the left lower lobe. Trace blunting of bilateral costophrenic angles. No pneumothorax. Similar mildly enlarged cardiomediastinal silhouette. No acute osseous abnormality. IMPRESSION: 1. Increased platelike density in the right mid lung, likely atelectasis. 2. New ovoid opacity in the left lower lobe, which may represent atelectasis or pneumonia. Recommend follow-up chest radiograph in 8-12 weeks to ensure resolution.  3. Trace blunting of bilateral costophrenic angles, which may represent trace pleural effusions. Electronically Signed   By: Limin  Xu M.D.   On: 10/29/2023 13:25    Pulse ox 91% today Her pulmonary doctor changed inhalers to NMTs to increase efficacy    Home health is coming in    Now Feels sick all over  Upset on her stomach   Nausea and vomiting got better for bit  By the time she left was nauseated again   Not eating anything   Has zofran  -helps for a little while  Then nausea starts all over again   No abd pain  No diarrhea  No constipation   Is drinking fluids  Water   110/61 last blood pressure this am   Did fall this am  Did not hit her head   Just a little dizzy right now  Not as bad   Not coughing  Breathing is baseline      ROS: Per HPI  Current Outpatient Medications:    acetaminophen  (TYLENOL ) 500 MG tablet, Take 500-1,000 mg by mouth every 6 (six) hours as needed for mild pain or headache. , Disp: , Rfl:    albuterol  (VENTOLIN  HFA) 108 (90 Base) MCG/ACT inhaler, INHALE 2 PUFFS BY MOUTH EVERY 4 HOURS AS NEEDED FOR WHEEZING OR SHORTNESS OF BREATH, Disp: 18 g, Rfl: 11   apixaban  (ELIQUIS ) 5 MG TABS tablet, Take 1 tablet (5 mg total) by mouth 2 (two) times daily., Disp: , Rfl:    aspirin  EC 81 MG tablet, Take 1 tablet (81 mg total) by mouth daily., Disp: 150 tablet, Rfl: 2   atorvastatin  (LIPITOR) 40 MG tablet, Take 1 tablet by mouth once daily, Disp: 90 tablet, Rfl: 2   benzonatate  (TESSALON ) 200 MG capsule, Take 1 capsule (200 mg total) by mouth 2 (two) times daily as needed for cough., Disp: 20 capsule, Rfl: 0   Cholecalciferol  (VITAMIN D3) 50 MCG (2000 UT) capsule, Take 2,000 Units by mouth daily., Disp: , Rfl:    FLUoxetine  (PROZAC ) 20 MG capsule, Take 1 capsule by mouth once daily, Disp: 90 capsule, Rfl: 2   furosemide  (LASIX ) 40 MG tablet, Take 40 mg by mouth daily as needed., Disp: , Rfl:    gabapentin  (NEURONTIN ) 100 MG capsule, Take 2 capsules (200 mg total) by mouth 2 (two) times daily as needed., Disp: 60 capsule, Rfl: 0   ipratropium-albuterol  (DUONEB) 0.5-2.5 (3) MG/3ML SOLN, Take 3 mLs by nebulization every 6 (six) hours as needed., Disp: 360 mL, Rfl: 3   iron  polysaccharides (NIFEREX)  150 MG capsule, Take 1 capsule (150 mg total) by mouth daily., Disp: 90 capsule, Rfl: 0   levETIRAcetam  (KEPPRA ) 250 MG tablet, Take 250 mg by mouth 2 (two) times daily. , Disp: , Rfl:    midodrine  (PROAMATINE ) 10 MG tablet, Take 1 tablet (10 mg total) by mouth 3 (three) times daily with meals., Disp: 90 tablet, Rfl: 5   Nebulizers (COMPRESSOR/NEBULIZER) MISC, For use with medication for the lungs.  Use as directed., Disp: 1 each, Rfl: 0   ondansetron  (ZOFRAN ) 8 MG tablet, Take 1 tablet (8 mg total) by mouth every 8 (eight) hours as needed for nausea or vomiting., Disp: 60 tablet, Rfl: 0   OXYGEN , Inhale 4 L into the lungs continuous., Disp: , Rfl:    pantoprazole  (PROTONIX ) 40 MG tablet, Take 1 tablet (40 mg total) by mouth daily., Disp: 90 tablet, Rfl: 3   potassium chloride  (KLOR-CON ) 10 MEQ tablet, Take 3  tablets (30 mEq total) by mouth daily., Disp: 90 tablet, Rfl: 0   pyridostigmine  (MESTINON ) 60 MG tablet, Take 1 tablet (60 mg total) by mouth 3 (three) times daily., Disp: 270 tablet, Rfl: 3   zolpidem  (AMBIEN ) 10 MG tablet, Take 5-10 mg by mouth at bedtime as needed., Disp: , Rfl:    fludrocortisone  (FLORINEF ) 0.1mg /mL SUSP, Take 2 mLs (0.2 mg total) by mouth in the morning and at bedtime., Disp: , Rfl:   Observations/Objective: Today's Vitals   11/27/23 1150  BP: 110/61  SpO2: 91%   Patient appears well, in no distress, but frail appearing  Weight is baseline  No facial swelling or asymmetry Normal voice-not hoarse and no slurred speech No obvious tremor or mobility impairment Moving neck and UEs normally Able to hear the call well  No cough or shortness of breath during interview (wearing 02 via )  Talkative and mentally sharp with no cognitive changes, but seems fatigued  No skin changes on face or neck , no rash or pallor Affect is normal   Review of Systems  Constitutional:  Positive for malaise/fatigue. Negative for chills, diaphoresis, fever and weight loss.  HENT:   Negative for sore throat.   Respiratory:  Negative for cough and wheezing.   Cardiovascular:  Negative for chest pain and palpitations.  Gastrointestinal:  Positive for nausea. Negative for abdominal pain, blood in stool, constipation, diarrhea, heartburn, melena and vomiting.  Genitourinary:  Negative for dysuria and frequency.  Neurological:  Positive for dizziness. Negative for focal weakness.    Physical Exam  Assessment and Plan: Orthostatic hypotension Assessment & Plan: Worse again  Recent hosp for n/v  Now just nausea   Plan to get back on all 3 blood pressure supporting medicines  Pyridostigmine  60 mg tid Fludrocortisone  0.2 mg bid  (watching K with this, was normal at discharge and continues supplementation)  Minodrine 10 mg tid   Refilled florinef   Encouraged fluids (treating nausea so she can drink and eat)   Strict ER precautions (has declined ER)  Update if not starting to improve in a week or if worsening  Plan to follow up in office when this is improved enough to transfer    Chronic hypoxic respiratory failure (HCC) Assessment & Plan: Reviewed last pulmonary note Some inhaled meds were changed to nmts which has been more effective  Pulse ox at home 91%   Community acquired pneumonia of left lung, unspecified part of lung Assessment & Plan: Clinically improved after augmentin   Also azithro and ceffriaxone in hospital   Pulse ox 91% at home    Nausea and vomiting, unspecified vomiting type Assessment & Plan: Recent hosp for  ? If was due to pna or viral infection  Now nausea without vomiting  No fever or stool change Nausea prevents enough fluid intake and she is hypotensive  Will carefully increase zofran  from 4 to 8 mg tid prn to see if we can have better control  Goal to be less dizzy so she can make it into office for exam and labs    AKI (acute kidney injury) Assessment & Plan: During recent hosp  Reviewed hospital records, lab results  and studies in detail  Improved after fluids  GFR of over 60 at discharge    Iron  deficiency anemia, unspecified iron  deficiency anemia type Assessment & Plan: Last hb 11.0 (when getting fluids in hospital)   Will re check when she can come in to the office    Other orders -  Ondansetron  HCl; Take 1 tablet (8 mg total) by mouth every 8 (eight) hours as needed for nausea or vomiting.  Dispense: 60 tablet; Refill: 0 -     fludrocortisone ; Take 2 mLs (0.2 mg total) by mouth in the morning and at bedtime.    Follow Up Instructions: No follow-ups on file. Start back on the generic florinef  to support blood pressure  Continue other medicines   I increased zofran  from 4 to 8 mg to help nausea Be careful of sedation with this  When nausea improves-try to get in more fluids   Check on on Monday  We will want to see you in the office when you can make the trip to re check exam and labs  If any severe symptoms go to the ER If vomiting returns - ER as well     I discussed the assessment and treatment plan with the patient. The patient was provided an opportunity to ask questions, and all were answered. The patient agreed with the plan and demonstrated an understanding of the instructions.   The patient was advised to call back or seek an in-person evaluation if the symptoms worsen or if the condition fails to improve as anticipated.  The above assessment and management plan was discussed with the patient. The patient verbalized understanding of and has agreed to the management plan.   Laine Balls, MD

## 2023-11-27 NOTE — Assessment & Plan Note (Addendum)
 Worse again  Recent hosp for n/v  Now just nausea   Plan to get back on all 3 blood pressure supporting medicines  Pyridostigmine  60 mg tid Fludrocortisone  0.2 mg bid  (watching K with this, was normal at discharge and continues supplementation)  Minodrine 10 mg tid   Refilled florinef   Encouraged fluids (treating nausea so she can drink and eat)   Strict ER precautions (has declined ER)  Update if not starting to improve in a week or if worsening  Plan to follow up in office when this is improved enough to transfer

## 2023-11-27 NOTE — Telephone Encounter (Signed)
Left VM giving VO ?

## 2023-11-27 NOTE — Telephone Encounter (Addendum)
 Called and spoke with patient, advised of Dr. Elsworth message. Patient verbalized understanding and will call 911 if symptoms worsen. Advised CMA will call patient prior to appt to get her checked in.

## 2023-11-27 NOTE — Telephone Encounter (Signed)
Please ok that verbal order  

## 2023-11-27 NOTE — Telephone Encounter (Signed)
 Not sure how much I can help without being able to examine her  Will do our best and see her then  If symptoms worsen- please call 911 to get to hospital

## 2023-11-27 NOTE — Assessment & Plan Note (Signed)
 Clinically improved after augmentin   Also azithro and ceffriaxone in hospital   Pulse ox 91% at home

## 2023-11-27 NOTE — Telephone Encounter (Signed)
VO given to Amanda. 

## 2023-11-27 NOTE — Telephone Encounter (Signed)
 Copied from CRM [id number]. Topic: Clinical - Prescription Issue >> Nov 27, 2023  4:42 PM Tysheama G wrote: Reason for CRM: Patient was prescribed fludrocortisone  (FLORINEF ) 0.1mg /mL SUSP today and pharmacy stated it's a compound drug and they can't fill it and they sent it back to Dr.Tower. Patient's husband wants to know if Dr.Tower received it. Callback number [phone]

## 2023-11-27 NOTE — Assessment & Plan Note (Signed)
 Last hb 11.0 (when getting fluids in hospital)   Will re check when she can come in to the office

## 2023-11-27 NOTE — Assessment & Plan Note (Signed)
 During recent hosp  Reviewed hospital records, lab results and studies in detail  Improved after fluids  GFR of over 60 at discharge

## 2023-11-27 NOTE — Telephone Encounter (Signed)
 Copied from CRM [id number]. Topic: Clinical - Home Health Verbal Orders >> Nov 27, 2023 11:05 AM Revonda D wrote: Caller/Agency: Alan with [facility]  Callback Number: [phone] option 2 (secure line)  Service Requested: Physical Therapy Frequency: no sure  Any new concerns about the patient? Yes, needs a verbal order for delay of resumption of care

## 2023-11-27 NOTE — Telephone Encounter (Signed)
 Copied from CRM #[id number]. Topic: Clinical - Home Health Verbal Orders >> Nov 27, 2023 11:56 AM Franky GRADE wrote: Caller/Agency: [place] Callback Number: [phone] Service Requested: Occupational Therapy Frequency: 2 week 4 as of October the 6th.  Any new concerns about the patient? No

## 2023-11-27 NOTE — Assessment & Plan Note (Signed)
 Reviewed last pulmonary note Some inhaled meds were changed to nmts which has been more effective  Pulse ox at home 91%

## 2023-11-27 NOTE — Assessment & Plan Note (Signed)
 Recent hosp for  ? If was due to pna or viral infection  Now nausea without vomiting  No fever or stool change Nausea prevents enough fluid intake and she is hypotensive  Will carefully increase zofran  from 4 to 8 mg tid prn to see if we can have better control  Goal to be less dizzy so she can make it into office for exam and labs

## 2023-11-28 LAB — CULTURE, BLOOD (ROUTINE X 2)
Culture: NO GROWTH
Culture: NO GROWTH

## 2023-11-30 ENCOUNTER — Inpatient Hospital Stay
Admission: EM | Admit: 2023-11-30 | Discharge: 2023-12-07 | DRG: 312 | Disposition: A | Discharge status: Skilled Nursing Facility | Attending: Internal Medicine | Admitting: Internal Medicine

## 2023-11-30 ENCOUNTER — Other Ambulatory Visit: Payer: Self-pay

## 2023-11-30 ENCOUNTER — Other Ambulatory Visit: Payer: Self-pay | Admitting: Pharmacist

## 2023-11-30 ENCOUNTER — Inpatient Hospital Stay: Admitting: Family Medicine

## 2023-11-30 DIAGNOSIS — Z7952 Long term (current) use of systemic steroids: Secondary | ICD-10-CM

## 2023-11-30 DIAGNOSIS — Z9841 Cataract extraction status, right eye: Secondary | ICD-10-CM

## 2023-11-30 DIAGNOSIS — C719 Malignant neoplasm of brain, unspecified: Secondary | ICD-10-CM | POA: Diagnosis present

## 2023-11-30 DIAGNOSIS — I25118 Atherosclerotic heart disease of native coronary artery with other forms of angina pectoris: Secondary | ICD-10-CM | POA: Diagnosis present

## 2023-11-30 DIAGNOSIS — Z7901 Long term (current) use of anticoagulants: Secondary | ICD-10-CM

## 2023-11-30 DIAGNOSIS — R55 Syncope and collapse: Secondary | ICD-10-CM | POA: Diagnosis not present

## 2023-11-30 DIAGNOSIS — I252 Old myocardial infarction: Secondary | ICD-10-CM

## 2023-11-30 DIAGNOSIS — R634 Abnormal weight loss: Secondary | ICD-10-CM | POA: Insufficient documentation

## 2023-11-30 DIAGNOSIS — E785 Hyperlipidemia, unspecified: Secondary | ICD-10-CM | POA: Diagnosis present

## 2023-11-30 DIAGNOSIS — D509 Iron deficiency anemia, unspecified: Secondary | ICD-10-CM | POA: Diagnosis present

## 2023-11-30 DIAGNOSIS — Z79899 Other long term (current) drug therapy: Secondary | ICD-10-CM

## 2023-11-30 DIAGNOSIS — Z8701 Personal history of pneumonia (recurrent): Secondary | ICD-10-CM

## 2023-11-30 DIAGNOSIS — Z950 Presence of cardiac pacemaker: Secondary | ICD-10-CM

## 2023-11-30 DIAGNOSIS — Y92230 Patient room in hospital as the place of occurrence of the external cause: Secondary | ICD-10-CM | POA: Diagnosis not present

## 2023-11-30 DIAGNOSIS — Z885 Allergy status to narcotic agent status: Secondary | ICD-10-CM

## 2023-11-30 DIAGNOSIS — I442 Atrioventricular block, complete: Secondary | ICD-10-CM | POA: Diagnosis present

## 2023-11-30 DIAGNOSIS — M8580 Other specified disorders of bone density and structure, unspecified site: Secondary | ICD-10-CM | POA: Diagnosis present

## 2023-11-30 DIAGNOSIS — I951 Orthostatic hypotension: Secondary | ICD-10-CM | POA: Diagnosis not present

## 2023-11-30 DIAGNOSIS — Z923 Personal history of irradiation: Secondary | ICD-10-CM

## 2023-11-30 DIAGNOSIS — R531 Weakness: Secondary | ICD-10-CM

## 2023-11-30 DIAGNOSIS — Z66 Do not resuscitate: Secondary | ICD-10-CM | POA: Diagnosis present

## 2023-11-30 DIAGNOSIS — I959 Hypotension, unspecified: Secondary | ICD-10-CM | POA: Insufficient documentation

## 2023-11-30 DIAGNOSIS — Z91148 Patient's other noncompliance with medication regimen for other reason: Secondary | ICD-10-CM

## 2023-11-30 DIAGNOSIS — E875 Hyperkalemia: Secondary | ICD-10-CM | POA: Diagnosis present

## 2023-11-30 DIAGNOSIS — Z9981 Dependence on supplemental oxygen: Secondary | ICD-10-CM

## 2023-11-30 DIAGNOSIS — G4700 Insomnia, unspecified: Secondary | ICD-10-CM | POA: Diagnosis present

## 2023-11-30 DIAGNOSIS — Z90710 Acquired absence of both cervix and uterus: Secondary | ICD-10-CM

## 2023-11-30 DIAGNOSIS — Z8249 Family history of ischemic heart disease and other diseases of the circulatory system: Secondary | ICD-10-CM

## 2023-11-30 DIAGNOSIS — Z9842 Cataract extraction status, left eye: Secondary | ICD-10-CM

## 2023-11-30 DIAGNOSIS — Q048 Other specified congenital malformations of brain: Secondary | ICD-10-CM

## 2023-11-30 DIAGNOSIS — F32A Depression, unspecified: Secondary | ICD-10-CM | POA: Diagnosis present

## 2023-11-30 DIAGNOSIS — R569 Unspecified convulsions: Secondary | ICD-10-CM

## 2023-11-30 DIAGNOSIS — M1990 Unspecified osteoarthritis, unspecified site: Secondary | ICD-10-CM | POA: Diagnosis present

## 2023-11-30 DIAGNOSIS — I480 Paroxysmal atrial fibrillation: Secondary | ICD-10-CM | POA: Diagnosis present

## 2023-11-30 DIAGNOSIS — R269 Unspecified abnormalities of gait and mobility: Secondary | ICD-10-CM | POA: Diagnosis present

## 2023-11-30 DIAGNOSIS — J4489 Other specified chronic obstructive pulmonary disease: Secondary | ICD-10-CM | POA: Diagnosis present

## 2023-11-30 DIAGNOSIS — G909 Disorder of the autonomic nervous system, unspecified: Secondary | ICD-10-CM | POA: Diagnosis present

## 2023-11-30 DIAGNOSIS — Z853 Personal history of malignant neoplasm of breast: Secondary | ICD-10-CM

## 2023-11-30 DIAGNOSIS — I10 Essential (primary) hypertension: Secondary | ICD-10-CM | POA: Diagnosis present

## 2023-11-30 DIAGNOSIS — Z803 Family history of malignant neoplasm of breast: Secondary | ICD-10-CM

## 2023-11-30 DIAGNOSIS — K5900 Constipation, unspecified: Secondary | ICD-10-CM | POA: Diagnosis present

## 2023-11-30 DIAGNOSIS — G40909 Epilepsy, unspecified, not intractable, without status epilepticus: Secondary | ICD-10-CM | POA: Diagnosis present

## 2023-11-30 DIAGNOSIS — W06XXXA Fall from bed, initial encounter: Secondary | ICD-10-CM | POA: Diagnosis not present

## 2023-11-30 DIAGNOSIS — Z87891 Personal history of nicotine dependence: Secondary | ICD-10-CM

## 2023-11-30 DIAGNOSIS — E876 Hypokalemia: Secondary | ICD-10-CM | POA: Diagnosis present

## 2023-11-30 DIAGNOSIS — J9611 Chronic respiratory failure with hypoxia: Secondary | ICD-10-CM | POA: Diagnosis present

## 2023-11-30 DIAGNOSIS — Z860100 Personal history of colon polyps, unspecified: Secondary | ICD-10-CM

## 2023-11-30 DIAGNOSIS — Z7982 Long term (current) use of aspirin: Secondary | ICD-10-CM

## 2023-11-30 DIAGNOSIS — Z87440 Personal history of urinary (tract) infections: Secondary | ICD-10-CM

## 2023-11-30 LAB — BASIC METABOLIC PANEL WITH GFR
Anion gap: 12 (ref 5–15)
BUN: 21 mg/dL (ref 8–23)
CO2: 24 mmol/L (ref 22–32)
Calcium: 9.8 mg/dL (ref 8.9–10.3)
Chloride: 101 mmol/L (ref 98–111)
Creatinine, Ser: 1.01 mg/dL — ABNORMAL HIGH (ref 0.44–1.00)
GFR, Estimated: 58 mL/min — ABNORMAL LOW (ref >60)
Glucose, Bld: 116 mg/dL — ABNORMAL HIGH (ref 70–99)
Potassium: 5.6 mmol/L — ABNORMAL HIGH (ref 3.5–5.1)
Sodium: 137 mmol/L (ref 135–145)

## 2023-11-30 LAB — CBC
HCT: 43.9 % (ref 36.0–46.0)
Hemoglobin: 13.6 g/dL (ref 12.0–15.0)
MCH: 26.2 pg (ref 26.0–34.0)
MCHC: 31 g/dL (ref 30.0–36.0)
MCV: 84.6 fL (ref 80.0–100.0)
Platelets: 338 K/uL (ref 150–400)
RBC: 5.19 MIL/uL — ABNORMAL HIGH (ref 3.87–5.11)
RDW: 15.2 % (ref 11.5–15.5)
WBC: 6.5 K/uL (ref 4.0–10.5)
nRBC: 0 % (ref 0.0–0.2)

## 2023-11-30 LAB — URINALYSIS, COMPLETE (UACMP) WITH MICROSCOPIC
Bacteria, UA: NONE SEEN
Bilirubin Urine: NEGATIVE
Glucose, UA: NEGATIVE mg/dL
Ketones, ur: NEGATIVE mg/dL
Leukocytes,Ua: NEGATIVE
Nitrite: NEGATIVE
Protein, ur: NEGATIVE mg/dL
Specific Gravity, Urine: 1.019 (ref 1.005–1.030)
pH: 5 (ref 5.0–8.0)

## 2023-11-30 LAB — CORTISOL: Cortisol, Plasma: 6.9 ug/dL

## 2023-11-30 LAB — TROPONIN I (HIGH SENSITIVITY)
Troponin I (High Sensitivity): 28 ng/L — ABNORMAL HIGH (ref <18)
Troponin I (High Sensitivity): 24 ng/L — ABNORMAL HIGH (ref <18)

## 2023-11-30 LAB — TSH: TSH: 1.764 u[IU]/mL (ref 0.350–4.500)

## 2023-11-30 MED ORDER — SODIUM CHLORIDE 0.9 % IV SOLN: INTRAVENOUS | Status: AC

## 2023-11-30 MED ORDER — ACETAMINOPHEN 325 MG PO TABS
650.0000 mg | ORAL_TABLET | Freq: Four times a day (QID) | ORAL | Status: DC | PRN
  Administered 2023-12-03 – 2023-12-07 (×6): 650 mg via ORAL
  Filled 2023-11-30 (×6): qty 2

## 2023-11-30 MED ORDER — POLYETHYLENE GLYCOL 3350 17 G PO PACK
17.0000 g | PACK | Freq: Every day | ORAL | Status: DC
  Administered 2023-11-30 – 2023-12-07 (×6): 17 g via ORAL
  Filled 2023-11-30 (×7): qty 1

## 2023-11-30 MED ORDER — ONDANSETRON HCL 4 MG/2ML IJ SOLN
4.0000 mg | Freq: Four times a day (QID) | INTRAMUSCULAR | Status: DC | PRN
  Administered 2023-12-04 – 2023-12-06 (×2): 4 mg via INTRAVENOUS
  Filled 2023-11-30 (×2): qty 2

## 2023-11-30 MED ORDER — IPRATROPIUM-ALBUTEROL 0.5-2.5 (3) MG/3ML IN SOLN: 3.0000 mL | Freq: Four times a day (QID) | RESPIRATORY_TRACT | Status: DC | PRN

## 2023-11-30 MED ORDER — POLYSACCHARIDE IRON COMPLEX 150 MG PO CAPS
150.0000 mg | ORAL_CAPSULE | Freq: Every day | ORAL | Status: DC
  Administered 2023-12-01 – 2023-12-07 (×7): 150 mg via ORAL
  Filled 2023-11-30 (×7): qty 1

## 2023-11-30 MED ORDER — FLUDROCORTISONE ACETATE 0.1 MG PO TABS
0.1000 mg | ORAL_TABLET | Freq: Two times a day (BID) | ORAL | Status: DC
  Administered 2023-11-30 – 2023-12-07 (×15): 0.1 mg via ORAL
  Filled 2023-11-30 (×16): qty 1

## 2023-11-30 MED ORDER — ACETAMINOPHEN 650 MG RE SUPP: 650.0000 mg | Freq: Four times a day (QID) | RECTAL | Status: DC | PRN

## 2023-11-30 MED ORDER — FLUDROCORTISONE ACETATE 0.1 MG PO TABS
0.2000 mg | ORAL_TABLET | Freq: Two times a day (BID) | ORAL | Status: DC
Start: 2023-11-30 — End: 2023-11-30
  Administered 2023-11-30: 0.2 mg via ORAL
  Filled 2023-11-30: qty 2

## 2023-11-30 MED ORDER — BISACODYL 10 MG RE SUPP
10.0000 mg | Freq: Every day | RECTAL | Status: DC | PRN
  Administered 2023-12-01: 10 mg via RECTAL
  Filled 2023-11-30: qty 1

## 2023-11-30 MED ORDER — PYRIDOSTIGMINE BROMIDE 60 MG PO TABS
60.0000 mg | ORAL_TABLET | Freq: Three times a day (TID) | ORAL | Status: DC
  Administered 2023-11-30 – 2023-12-07 (×22): 60 mg via ORAL
  Filled 2023-11-30 (×24): qty 1

## 2023-11-30 MED ORDER — SODIUM ZIRCONIUM CYCLOSILICATE 10 G PO PACK
10.0000 g | PACK | Freq: Once | ORAL | Status: AC
  Administered 2023-11-30: 10 g via ORAL
  Filled 2023-11-30: qty 1

## 2023-11-30 MED ORDER — MIDODRINE HCL 5 MG PO TABS
10.0000 mg | ORAL_TABLET | Freq: Three times a day (TID) | ORAL | Status: DC
  Administered 2023-11-30 – 2023-12-07 (×19): 10 mg via ORAL
  Filled 2023-11-30 (×20): qty 2

## 2023-11-30 MED ORDER — FLUDROCORTISONE ACETATE 0.1 MG PO TABS: 0.1000 mg | ORAL_TABLET | Freq: Two times a day (BID) | ORAL | 0 refills | Status: DC

## 2023-11-30 MED ORDER — ONDANSETRON HCL 4 MG PO TABS: 4.0000 mg | ORAL_TABLET | Freq: Four times a day (QID) | ORAL | Status: DC | PRN

## 2023-11-30 MED ORDER — OXYCODONE HCL 5 MG PO TABS: 5.0000 mg | ORAL_TABLET | ORAL | Status: DC | PRN

## 2023-11-30 MED ORDER — ALBUTEROL SULFATE (2.5 MG/3ML) 0.083% IN NEBU: 2.5000 mg | INHALATION_SOLUTION | RESPIRATORY_TRACT | Status: DC | PRN

## 2023-11-30 MED ORDER — FLUOXETINE HCL 20 MG PO CAPS
20.0000 mg | ORAL_CAPSULE | Freq: Every day | ORAL | Status: DC
  Administered 2023-11-30 – 2023-12-07 (×8): 20 mg via ORAL
  Filled 2023-11-30 (×8): qty 1

## 2023-11-30 MED ORDER — ZOLPIDEM TARTRATE 5 MG PO TABS
5.0000 mg | ORAL_TABLET | Freq: Every evening | ORAL | Status: DC | PRN
  Administered 2023-12-02 – 2023-12-06 (×6): 5 mg via ORAL
  Filled 2023-11-30 (×6): qty 1

## 2023-11-30 MED ORDER — BENZONATATE 100 MG PO CAPS: 200.0000 mg | ORAL_CAPSULE | Freq: Two times a day (BID) | ORAL | Status: DC | PRN

## 2023-11-30 MED ORDER — APIXABAN 5 MG PO TABS
5.0000 mg | ORAL_TABLET | Freq: Two times a day (BID) | ORAL | Status: DC
Start: 2023-11-30 — End: 2023-12-08
  Administered 2023-11-30 – 2023-12-07 (×16): 5 mg via ORAL
  Filled 2023-11-30 (×16): qty 1

## 2023-11-30 MED ORDER — SENNOSIDES-DOCUSATE SODIUM 8.6-50 MG PO TABS
1.0000 | ORAL_TABLET | Freq: Two times a day (BID) | ORAL | Status: DC
  Administered 2023-11-30 – 2023-12-07 (×10): 1 via ORAL
  Filled 2023-11-30 (×13): qty 1

## 2023-11-30 MED ORDER — ATORVASTATIN CALCIUM 20 MG PO TABS
40.0000 mg | ORAL_TABLET | Freq: Every day | ORAL | Status: DC
  Administered 2023-11-30 – 2023-12-07 (×8): 40 mg via ORAL
  Filled 2023-11-30 (×8): qty 2

## 2023-11-30 MED ORDER — LEVETIRACETAM 250 MG PO TABS
250.0000 mg | ORAL_TABLET | Freq: Two times a day (BID) | ORAL | Status: DC
  Administered 2023-11-30 – 2023-12-07 (×16): 250 mg via ORAL
  Filled 2023-11-30 (×16): qty 1

## 2023-11-30 MED ORDER — ASPIRIN 81 MG PO TBEC
81.0000 mg | DELAYED_RELEASE_TABLET | Freq: Every day | ORAL | Status: DC
  Administered 2023-11-30 – 2023-12-07 (×8): 81 mg via ORAL
  Filled 2023-11-30 (×8): qty 1

## 2023-11-30 MED ORDER — SODIUM CHLORIDE 0.9 % IV BOLUS
500.0000 mL | Freq: Once | INTRAVENOUS | Status: AC
  Administered 2023-11-30: 500 mL via INTRAVENOUS

## 2023-11-30 NOTE — ED Triage Notes (Signed)
 Pt presents to the ED via [facility] from home. EMS was called out for a syncopal episode. Husband states that she had three syncopal episodes. Pt became weak when standing with EMS. Pt has a pacemaker. Denies pain. Pt did not fall with syncopal episodes. Pt was here with [facility] on 9/29.  BP 191/94 HR 79 96% on 4L via [place] (baseline)

## 2023-11-30 NOTE — Telephone Encounter (Signed)
 First I have heard of that  Did she get it at another pharmacy prior  If not - where do I sent that can compound it ?  Thanks   Will cc Manuelita, our in house pharmacist (thanks in advance)

## 2023-11-30 NOTE — Addendum Note (Signed)
 Addended by: RANDEEN HARDY A on: 11/30/2023 04:33 PM   Modules accepted: Orders

## 2023-11-30 NOTE — Progress Notes (Signed)
 Error context

## 2023-11-30 NOTE — ED Provider Notes (Signed)
 [facility] Provider Note    Event Date/Time   First MD Initiated Contact with Patient 11/30/23 [id number]     (approximate)   History   Loss of Consciousness   HPI  Diana Cook is a 73 y.o. female history of COPD coronary disease heart block with pacemaker, carotid stenosis on Eliquis , seizures, orthostatic hypotension reported as brittle   Previous notations that the patient has been evaluated and does not have adrenal insufficiency.  She is taking Florinef  for orthostatic hypotension   Patient reports that she fainted 3 times since last night.  Twice during the night she tried to get up and when she sits up she will faint.  She did not fall or become injured she fainted into the bed.  Reports her husband witnessed it.  Then this morning when getting up she got to the edge of the bed put her feet on the ground and started feeling faint she lowered herself carefully to the ground she did not hit her head did not fainted briefly for a few seconds while she was laying on the floor.  She reports this happened to her many many times.  This seems to be a recurring problem with a cane today because she has fainted 3 times in the last day  No pain no discomfort no injuries.  No shortness of breath no cough.  No abdominal pain no chest pain.  She feels well.  She is taking lots of medicine orthostatic hypotension  She has been in good health the last several days,  Physical Exam   Triage Vital Signs: ED Triage Vitals  Encounter Vitals Group     BP 11/30/23 0917 (!) 113/55     Girls Systolic BP Percentile --      Girls Diastolic BP Percentile --      Boys Systolic BP Percentile --      Boys Diastolic BP Percentile --      Pulse Rate 11/30/23 0917 72     Resp 11/30/23 0917 17     Temp 11/30/23 0917 (!) 97.5 F (36.4 C)     Temp Source 11/30/23 0917 Oral     SpO2 11/30/23 0917 97 %     Weight 11/30/23 0915 180 lb (81.6 kg)     Height 11/30/23 0915 5' 9  (1.753 m)     Head Circumference --      Peak Flow --      Pain Score 11/30/23 0915 0     Pain Loc --      Pain Education --      Exclude from Growth Chart --     Most recent vital signs: Vitals:   11/30/23 1000 11/30/23 1030  BP: 137/60 (!) 143/77  Pulse: 64 61  Resp: 20 20  Temp:    SpO2: 95% 92%     General: Awake, no distress.  Very pleasant normocephalic atraumatic CV:  Good peripheral perfusion.  Normal tones and rate. Resp:  Normal effort.  Clear bilateral.  Old scar from pacemaker insertion on the left upper chest clean dry intact Abd:  No distention.  Soft nontender Other:  Moves extremities well speech is clear extraocular movements normal.  In no distress.  Mucous membranes moist   ED Results / Procedures / Treatments   Labs (all labs ordered are listed, but only abnormal results are displayed) Labs Reviewed  CBC - Abnormal; Notable for the following components:      Result Value  RBC 5.19 (*)    All other components within normal limits  BASIC METABOLIC PANEL WITH GFR - Abnormal; Notable for the following components:   Potassium 5.6 (*)    Glucose, Bld 116 (*)    Creatinine, Ser 1.01 (*)    GFR, Estimated 58 (*)    All other components within normal limits  TROPONIN I (HIGH SENSITIVITY) - Abnormal; Notable for the following components:   Troponin I (High Sensitivity) 28 (*)    All other components within normal limits  TSH  CORTISOL  URINALYSIS, ROUTINE W REFLEX MICROSCOPIC  URINALYSIS, COMPLETE (UACMP) WITH MICROSCOPIC  TROPONIN I (HIGH SENSITIVITY)     EKG  Interpreted by me at 920 heart rate 70 QRS 160 QTc 530 Ventricular pacing.      PROCEDURES:  Critical Care performed: No  Procedures   MEDICATIONS ORDERED IN ED: Medications  midodrine  (PROAMATINE ) tablet 10 mg (10 mg Oral Given 11/30/23 1037)  fludrocortisone  (FLORINEF ) tablet 0.2 mg (0.2 mg Oral Given 11/30/23 1038)  apixaban  (ELIQUIS ) tablet 5 mg (5 mg Oral Given 11/30/23  1038)  levETIRAcetam  (KEPPRA ) tablet 250 mg (250 mg Oral Given 11/30/23 1037)  pyridostigmine  (MESTINON ) tablet 60 mg (60 mg Oral Given 11/30/23 1038)  sodium zirconium cyclosilicate (LOKELMA) packet 10 g (has no administration in time range)  0.9 %  sodium chloride  infusion (has no administration in time range)  acetaminophen  (TYLENOL ) tablet 650 mg (has no administration in time range)    Or  acetaminophen  (TYLENOL ) suppository 650 mg (has no administration in time range)  oxyCODONE  (Oxy IR/ROXICODONE ) immediate release tablet 5 mg (has no administration in time range)  ondansetron  (ZOFRAN ) tablet 4 mg (has no administration in time range)    Or  ondansetron  (ZOFRAN ) injection 4 mg (has no administration in time range)  albuterol  (PROVENTIL ) (2.5 MG/3ML) 0.083% nebulizer solution 2.5 mg (has no administration in time range)  sodium chloride  0.9 % bolus 500 mL (0 mLs Intravenous Stopped 11/30/23 1045)     IMPRESSION / MDM / ASSESSMENT AND PLAN / ED COURSE  I reviewed the triage vital signs and the nursing notes.                              Differential diagnosis includes, but is not limited to, recurrent orthostatic hypotension, dehydration anemia less likely felt to be associated infectious cause, it appears based on previous workup that adrenal insufficiency has been evaluated and excluded previously,   Patient's presentation is most consistent with acute complicated illness / injury requiring diagnostic workup.   The patient is on the cardiac monitor to evaluate for evidence of arrhythmia and/or significant heart rate changes.   Clinical Course as of 11/30/23 1128  Mon Nov 30, 2023  9052 Patient has not yet had her medications today.  I have ordered [MQ]  0951 CBC reassuring.  Normal white count normal hemoglobin [MQ]  1055 Patient and her husband both very concerned because of how many times she has fainted, husband advises I cannot keep scooper up off the floor.  She had  multiple syncopal episodes this morning history of similar in the past.  Husband affirms she never hit her head did not have any hard falls, he witnessed each of the events occurring during the night and this morning.  Patient would like to have further evaluation in the hospital concerned that she cannot safely go home with is often if she has fainting.  I  have requested admission [MQ]  1055 Patient also having mild hyperkalemia.  She is on potassium supplement.  Will withhold this at this time, continue to monitor.  Patient on cardiac monitoring.  Given she is on potassium supplementation, I think it would be reasonable to [MQ]  1056 I do not think she necessarily would benefit from urgent continue to observe her potassium potassium lower. Will give dose kayexelate [MQ]  1127 Patient admitted to the care of Dr. Jhonny [MQ]    Clinical Course User Index [MQ] Dicky Anes, MD     FINAL CLINICAL IMPRESSION(S) / ED DIAGNOSES   Final diagnoses:  Syncope and collapse  Hyperkalemia     Rx / DC Orders   ED Discharge Orders     None        Note:  This document was prepared using Dragon voice recognition software and may include unintentional dictation errors.   Dicky Anes, MD 11/30/23 1128

## 2023-11-30 NOTE — Consult Note (Signed)
 Cardiology Consultation   Patient ID: Diana Cook MRN: [id number]; DOB: Sep 01, 1950  Admit date: 11/30/2023 Date of Consult: 11/30/2023  PCP:  Randeen Laine LABOR, MD   [facility] Providers Cardiologist:  Lonni Hanson, MD  Electrophysiologist:  OLE ONEIDA HOLTS, MD  Electrophysiology APP:  Riddle, Suzann, NP       Patient Profile: Diana Cook is a 73 y.o. female with a hx of moderate coronary artery disease by cardiac CTA (11/2019) and cardiac cath (5/21), carotid artery stenosis status post right carotid stenting (04/2022), complete heart block status post permanent pacemaker followed by EP (last gen change 2021), paroxysmal atrial fibrillation, seizure disorder, COPD on chronic 4 L of O2, right breast cancer, who is being seen 11/30/2023 for the evaluation of syncope at the request of Dr Jhonny.  History of Present Illness: [name] was previously seen in May 2024 by reporting weight gain, fatigue, and intermittent confusion.  She had also Eliquis  but after discussion was decided symptom started before medication.  Echocardiogram revealed an LVEF 55 to 60%, no RWMA, G1 DD.  She was subsequently admitted to the hospital in early July 2020 for with syncope from orthostatic hypotension, UTI, hypokalemia, hypomagnesemia.  She was started on midodrine  and Florinef  and given IV fluid.  It was later found Florinef  did not help was discontinued.  Patient was discharged on midodrine  10 mg 3 times daily recommended wearing abdominal binder, TED hose, and increased oral hydration.  She was seen in the ER 09/14/2023 for orthostatic hypotension and syncope.  She was restarted on Florinef .  She was then admitted in August 2025 with orthostatic hypotension and syncope.  She was started on Mestinon  60 mg 3 times daily.  She was continued on midodrine  and Florinef .  Pacemaker was working normally.  She was then seen in clinic 11/05/2023 after being in rehab for 3 weeks after hospitalization.  She  was diagnosed with pneumonia the week before and had been on antibiotic therapy.  She was continued on 4 L of O2.  Her device was functioning appropriately.  She denied further syncope or near syncope and blood pressure had been good at home.  She was continued on triple therapy for orthostatic hypotension.  Was in the [facility] emergency department again on/20/25 for hypotension and loss of consciousness.  She believes that she had been taking all of her medications as prescribed and using her abdominal binder.  Exam positive for orthostatic hypotension.  Was treated with IV fluids in the emergency department.  She was started on Keflex .  She was then admitted from 11/23/2023 - 11/14/2023 for hypotension and nausea and vomiting with dizziness.  She was treated with azithromycin , ceftriaxone , and IV fluid.  She presented back to the [facility] emergency department on 11/30/2023 via EMS from home after having a syncopal episode x 3 per her husband.  Patient says that orthostatic hypotension is brittle.  And she is taking Florinef .  Patient reports she fainted 3 separate times since last night.  Twice during she tried to get up when she sits up she will faint.  She did not fall or become injured she lowered herself slowly to the floor.  Reports her husband witnessed each event.  Then this morning when getting up she got to the edge of the bed and put her feet on the ground started feeling faint and lowered herself to the ground but did not hit her head fainted briefly for few seconds then she was lying on the floor.  She reported this happened to her many many times.  This is a recurrent problem.  She denies any discomfort or injuries.  Denies any chest pain.  Unfortunately she states that she has not been compliant with her current medication regimen.  She states the only medication that she knows that she has been taking is her monitoring.  Unfortunately they were not able to get her Florinef  as her tablets were recently called  and has suspensions and the pharmacy advised that no one ever to fill them due to it being a compound.  And then she had dumped all of her pills into the bottom of her pocketbook and she and her husband were trying to sort out all of her medication.  She said she has had 70 hospitalizations and visits to the emergency department and she is unsure of what her medication regimen are.  She states that she has not been faithful with wearing her abdominal binder because she has not felt like wearing it and she has not been compliant with maintaining adequate hydration and is having to force herself to drink her water lately.  Initial vital signs reveal blood pressure of 113/55, pulse of 72, respirations and 17 temperature 97.5.  Potassium 5.6, blood glucose 116, serum creatinine 1.01, GFR 58, high-sensitivity troponin 28.  Cardiology was consulted to assist with her chronic severe orthostatic hypotension and recurrent syncope.   Past Medical History:  Diagnosis Date   Allergic rhinitis    Arthritis    Asthma    as a child, mild now   Breast cancer (HCC) 12/2015   right breast cancer, lumpectomy and mammosite    Cardiac pacemaker in situ    Colon polyps    colonoscopy 7/08, tubular adenoma   Complete heart block (HCC) 09/2011   s/p PPM implanted in [place] [facility]   COPD (chronic obstructive pulmonary disease) (HCC)    Myocardial infarction (HCC) 2011   Orthostatic hypotension    Osteopenia 10/2015   Pacemaker    2011   Paroxysmal atrial fibrillation (HCC) 03/2021   Incidentally detected on pacemaker interrogation   Personal history of radiation therapy 2017   right breast ca, mammosite placed   Rotator cuff tear, right 02/2022   Had an MRI in [place] I have 2 tears.   Seizure disorder (HCC)    Seizures (HCC)    first one was when she was 73 years old    Small bowel obstruction (HCC)    1988 and 2002   Tobacco abuse     Past Surgical History:  Procedure Laterality Date   ABDOMINAL  HYSTERECTOMY     BREAST BIOPSY Right 2007   benign inflammatory changes, mass due to underwire bra   BREAST BIOPSY Left 01/02/2016   columnar cell changes without atypical hyperplasia.   BREAST BIOPSY Right 12/06/2015   rt breast mass 10:00, bx done at Dr. Gilberto office, invasive ductal carcinoma   BREAST EXCISIONAL BIOPSY Left 01/02/2016   COLUMNAR CELL CHANGE AND HYPERPLASIA ASSOCIATED WITH LUMINAL AND STROMAL CALCIFICATIONS   BREAST LUMPECTOMY Right 01/02/2016   invasive mammary carcinoma, clear margins, negative LN   BREAST LUMPECTOMY WITH SENTINEL LYMPH NODE BIOPSY Right 01/02/2016   pT1c, N0; ER/ PR 100%; Her 2 neu not over expressed: BREAST LUMPECTOMY WITH SENTINEL LYMPH NODE BX;  Surgeon: Reyes LELON Cota, MD;  Location: [facility] ORS;  Service: General;  Laterality: Right;   CARDIAC CATHETERIZATION     CAROTID PTA/STENT INTERVENTION Right 04/29/2022   Procedure:  CAROTID PTA/STENT INTERVENTION;  Surgeon: Jama Cordella MATSU, MD;  Location: [facility] INVASIVE CV LAB;  Service: Cardiovascular;  Laterality: Right;   CATARACT EXTRACTION Bilateral    COLONOSCOPY  10/2015   Dr Teressa   COLONOSCOPY WITH PROPOFOL  N/A 08/19/2018   Procedure: COLONOSCOPY WITH PROPOFOL ;  Surgeon: Janalyn Keene NOVAK, MD;  Location: [facility] ENDOSCOPY;  Service: Endoscopy;  Laterality: N/A;   ESOPHAGOGASTRODUODENOSCOPY (EGD) WITH PROPOFOL  N/A 08/19/2018   Procedure: ESOPHAGOGASTRODUODENOSCOPY (EGD) WITH PROPOFOL ;  Surgeon: Janalyn Keene NOVAK, MD;  Location: [facility] ENDOSCOPY;  Service: Endoscopy;  Laterality: N/A;   EXPLORATORY LAPAROTOMY  01/25/2001   Exploratory laparotomy, lysis of adhesions, identification of internal hernia secondary to omental adhesion. Prolonged postoperative ileus.   gyn surgery  1993   hysterectomy- form endometriosis   LAPAROSCOPY     PACEMAKER INSERTION  10/24/11   [employer] Advantio dual chamber PPM implanted by Dr Ronny at [facility] in [place]   PPM GENERATOR  CHANGEOUT N/A 12/05/2019   Procedure: PPM GENERATOR HERMA;  Surgeon: Fernande Elspeth BROCKS, MD;  Location: [facility] INVASIVE CV LAB;  Service: Cardiovascular;  Laterality: N/A;   RIGHT/LEFT HEART CATH AND CORONARY ANGIOGRAPHY Bilateral 07/19/2019   Procedure: RIGHT/LEFT HEART CATH AND CORONARY ANGIOGRAPHY;  Surgeon: Mady Bruckner, MD;  Location: [facility] INVASIVE CV LAB;  Service: Cardiovascular;  Laterality: Bilateral;   TEMPORARY PACEMAKER N/A 12/05/2019   Procedure: TEMPORARY PACEMAKER;  Surgeon: Fernande Elspeth BROCKS, MD;  Location: [facility] INVASIVE CV LAB;  Service: Cardiovascular;  Laterality: N/A;     Home Medications:  Prior to Admission medications   Medication Sig Start Date End Date Taking? Authorizing Provider  acetaminophen  (TYLENOL ) 500 MG tablet Take 500-1,000 mg by mouth every 6 (six) hours as needed for mild pain or headache.    Yes [provider]  albuterol  (VENTOLIN  HFA) 108 (90 Base) MCG/ACT inhaler INHALE 2 PUFFS BY MOUTH EVERY 4 HOURS AS NEEDED FOR WHEEZING OR SHORTNESS OF BREATH 05/25/23  Yes Tamea Dedra CROME, MD  apixaban  (ELIQUIS ) 5 MG TABS tablet Take 1 tablet (5 mg total) by mouth 2 (two) times daily. 06/17/23  Yes Fernande Elspeth BROCKS, MD  aspirin  EC 81 MG tablet Take 1 tablet (81 mg total) by mouth daily. 12/05/22  Yes Schnier, Cordella MATSU, MD  atorvastatin  (LIPITOR) 40 MG tablet Take 1 tablet by mouth once daily 02/13/23  Yes Fernande Elspeth BROCKS, MD  benzonatate  (TESSALON ) 200 MG capsule Take 1 capsule (200 mg total) by mouth 2 (two) times daily as needed for cough. 03/03/23  Yes Bedsole, Amy E, MD  Cholecalciferol  (VITAMIN D3) 50 MCG (2000 UT) capsule Take 2,000 Units by mouth daily.   Yes [provider]  fludrocortisone  (FLORINEF ) 0.1mg /mL SUSP Take 2 mLs (0.2 mg total) by mouth in the morning and at bedtime. 11/27/23  Yes Tower, Laine LABOR, MD  FLUoxetine  (PROZAC ) 20 MG capsule Take 1 capsule by mouth once daily 06/23/23  Yes Tower, Laine LABOR, MD  furosemide  (LASIX ) 40 MG tablet Take  40 mg by mouth daily as needed.   Yes [provider]  gabapentin  (NEURONTIN ) 100 MG capsule Take 2 capsules (200 mg total) by mouth 2 (two) times daily as needed. 10/04/23  Yes Beamer, Richerd, MD  ipratropium-albuterol  (DUONEB) 0.5-2.5 (3) MG/3ML SOLN Take 3 mLs by nebulization every 6 (six) hours as needed. 10/30/23  Yes Tamea Dedra CROME, MD  iron  polysaccharides (NIFEREX) 150 MG capsule Take 1 capsule (150 mg total) by mouth daily. 11/02/23  Yes Tower, [employer]	[place],  MD  levETIRAcetam  (KEPPRA ) 250 MG tablet Take 250 mg by mouth 2 (two) times daily.  11/02/19  Yes [provider]  midodrine  (PROAMATINE ) 10 MG tablet Take 1 tablet (10 mg total) by mouth 3 (three) times daily with meals. 10/29/23  Yes Tower, Laine LABOR, MD  ondansetron  (ZOFRAN ) 8 MG tablet Take 1 tablet (8 mg total) by mouth every 8 (eight) hours as needed for nausea or vomiting. 11/27/23  Yes Tower, Laine LABOR, MD  pantoprazole  (PROTONIX ) 40 MG tablet Take 1 tablet (40 mg total) by mouth daily. 11/20/22 11/30/23 Yes Tamea Dedra CROME, MD  potassium chloride  (KLOR-CON ) 10 MEQ tablet Take 3 tablets (30 mEq total) by mouth daily. 10/29/23  Yes Tower, Laine LABOR, MD  pyridostigmine  (MESTINON ) 60 MG tablet Take 1 tablet (60 mg total) by mouth 3 (three) times daily. 09/25/23  Yes Furth, Cadence H, PA-C  zolpidem  (AMBIEN ) 10 MG tablet Take 5-10 mg by mouth at bedtime as needed. 11/05/23  Yes [provider]  Nebulizers (COMPRESSOR/NEBULIZER) MISC For use with medication for the lungs.  Use as directed. 10/30/23   Tamea Dedra CROME, MD  OXYGEN  Inhale 4 L into the lungs continuous.    [provider]    Scheduled Meds:  apixaban   5 mg Oral BID   aspirin  EC  81 mg Oral Daily   atorvastatin   40 mg Oral Daily   fludrocortisone   0.2 mg Oral BID   FLUoxetine   20 mg Oral Daily   [START ON 12/01/2023] iron  polysaccharides  150 mg Oral Daily   levETIRAcetam   250 mg Oral BID   midodrine   10 mg Oral TID WC   polyethylene glycol  17 g  Oral Daily   pyridostigmine   60 mg Oral TID   senna-docusate  1 tablet Oral BID   sodium zirconium cyclosilicate  10 g Oral Once   Continuous Infusions:  sodium chloride      PRN Meds: acetaminophen  **OR** acetaminophen , benzonatate , bisacodyl , ipratropium-albuterol , ondansetron  **OR** ondansetron  (ZOFRAN ) IV, oxyCODONE , zolpidem   Allergies:    Allergies  Allergen Reactions   Codeine Nausea And Vomiting   Morphine  And Codeine Nausea Only    Social History:   Social History   Socioeconomic History   Marital status: Married    Spouse name: Tom   Number of children: Not on file   Years of education: Not on file   Highest education level: Some college, no degree  Occupational History    Employer: RETIRED  Tobacco Use   Smoking status: Former    Current packs/day: 0.00    Average packs/day: 1.5 packs/day for 42.0 years (63.0 ttl pk-yrs)    Types: Cigarettes    Start date: 11/10/1968    Quit date: 11/11/2010    Years since quitting: 13.0   Smokeless tobacco: Never  Vaping Use   Vaping status: Never Used  Substance and Sexual Activity   Alcohol use: Yes    Alcohol/week: 1.0 - 2.0 standard drink of alcohol    Types: 1 - 2 Glasses of wine per week    Comment: weekly   Drug use: No   Sexual activity: Never  Other Topics Concern   Not on file  Social History Narrative   Married 40 years.   Exercises, does walking tapes   Social Drivers of Health   Financial Resource Strain: Low Risk  (05/13/2023)   Overall Financial Resource Strain (CARDIA)    Difficulty of Paying Living Expenses: Not hard at all  Food Insecurity: No Food  Insecurity (11/23/2023)   Hunger Vital Sign    Worried About Running Out of Food in the Last Year: Never true    Ran Out of Food in the Last Year: Never true  Transportation Needs: No Transportation Needs (11/23/2023)   PRAPARE - Administrator, Civil Service (Medical): No    Lack of Transportation (Non-Medical): No  Physical Activity:  Inactive (05/13/2023)   Exercise Vital Sign    Days of Exercise per Week: 0 days    Minutes of Exercise per Session: 0 min  Stress: No Stress Concern Present (05/13/2023)   [employer] of Occupational Health - Occupational Stress Questionnaire    Feeling of Stress : Not at all  Social Connections: Unknown (11/23/2023)   Social Connection and Isolation Panel    Frequency of Communication with Friends and Family: Patient declined    Frequency of Social Gatherings with Friends and Family: Patient declined    Attends Religious Services: Patient declined    Database administrator or Organizations: Patient declined    Attends Banker Meetings: Patient declined    Marital Status: Married  Catering manager Violence: Not At Risk (11/23/2023)   Humiliation, Afraid, Rape, and Kick questionnaire    Fear of Current or Ex-Partner: No    Emotionally Abused: No    Physically Abused: No    Sexually Abused: No    Family History:    Family History  Problem Relation Age of Onset   Stroke Mother    Heart disease Mother 61       MI and CABG   Dementia Mother    Coronary artery disease Father    Parkinsonism Father    Breast cancer Sister    Cancer - Cervical Daughter 70       died 03/19/2023   Heart attack Brother 46   Colon cancer Neg Hx      ROS:  Please see the history of present illness.  Review of Systems  Constitutional:  Positive for malaise/fatigue.  Cardiovascular:  Positive for leg swelling.  Gastrointestinal:  Positive for nausea.  Genitourinary:  Positive for urgency.  Musculoskeletal:  Positive for falls.  Neurological:  Positive for dizziness and weakness.    All other ROS reviewed and negative.     Physical Exam/Data: Vitals:   11/30/23 0917 11/30/23 0930 11/30/23 1000 11/30/23 1030  BP: (!) 113/55 118/62 137/60 (!) 143/77  Pulse: 72 70 64 61  Resp: 17 17 20 20   Temp: (!) 97.5 F (36.4 C)     TempSrc: Oral     SpO2: 97% 93% 95% 92%  Weight:       Height:      No data found.   Intake/Output Summary (Last 24 hours) at 11/30/2023 1135 Last data filed at 11/30/2023 1045 Gross per 24 hour  Intake 500 ml  Output --  Net 500 ml      11/30/2023    9:15 AM 11/24/2023    5:35 AM 11/23/2023    9:15 AM  Last 3 Weights  Weight (lbs) 180 lb 186 lb 11.7 oz 189 lb 9.5 oz  Weight (kg) 81.647 kg 84.7 kg 86 kg     Body mass index is 26.58 kg/m.  General:  Well nourished, well developed, in no acute distress, chronically ill-appearing HEENT: normal Neck: no JVD Vascular: No carotid bruits; Distal pulses 2+ bilaterally Cardiac:  normal S1, S2; RRR; no murmur  Lungs:  clear to auscultation bilaterally, no wheezing, rhonchi or  rales  Abd: soft, nontender, no hepatomegaly  Ext: no edema Musculoskeletal:  No deformities, BUE and BLE strength normal and equal Skin: warm and dry  Neuro:  CNs 2-12 intact, no focal abnormalities noted Psych:  Normal affect   EKG:  The EKG was personally reviewed and demonstrates: A sensed and V paced at a rate of 71 Telemetry:  Telemetry was personally reviewed and demonstrates: A sensed V paced on telemetry with rates in the 60's  Relevant CV Studies:  Echo 07/2023 1. Left ventricular ejection fraction, by estimation, is 55 to 60%. Left  ventricular ejection fraction by 2D MOD biplane is 56.1 %. The left  ventricle has normal function. The left ventricle has no regional wall  motion abnormalities. Left ventricular  diastolic parameters are consistent with Grade I diastolic dysfunction  (impaired relaxation).   2. Right ventricular systolic function is normal. The right ventricular  size is normal.   3. The mitral valve is normal in structure. Trivial mitral valve  regurgitation.   4. The aortic valve is tricuspid. Aortic valve regurgitation is not  visualized.    Carotid US  05/2023 Vertebrals:  Bilateral vertebral arteries demonstrate antegrade flow.  Subclavians: Normal flow hemodynamics were seen in  bilateral subclavian               arteries.   *See table(s) above for measurements and observations.   2d echo 07/16/21   1. Left ventricular ejection fraction, by estimation, is 55 to 60%. The  left ventricle has normal function. Septal wall motion abnormality  consistent with bundle branch block.   2. There is mild left ventricular hypertrophy. Left ventricular diastolic  parameters are consistent with Grade I diastolic dysfunction (impaired  relaxation).   3. Right ventricular systolic function is normal. The right ventricular  size is normal. There is normal pulmonary artery systolic pressure. The  estimated right ventricular systolic pressure is 31.6 mmHg.   4. The mitral valve is normal in structure. No evidence of mitral valve  regurgitation. No evidence of mitral stenosis.   5. The aortic valve was not well visualized. Aortic valve regurgitation  is not visualized. No aortic stenosis is present.   6. The inferior vena cava is normal in size with greater than 50%  respiratory variability, suggesting right atrial pressure of 3 mmHg.     R/LHC 07/19/2019 Conclusions: Mild to moderate, non-obstructive coronary artery disease with 30-40% mid LAD and 20% ostial OM2 stenoses, as well as mild plaquing of the proximal RCA. Normal left ventricular contraction with mildly elevated filling pressure. Mildly elevated right heart and pulmonary artery pressures. Mildly reduced Fick cardiac output/index. No evidence of significant intracardiac shunt.   Recommendations: Escalate diuresis given mildly elevated left and right heart filling pressures in the setting of diastolic dysfunction. Continue medical therapy and risk factor modification to prevent progression of coronary artery disease. Ongoing management of underlying lung disease per Dr. Tamea.  Laboratory Data: High Sensitivity Troponin:   Recent Labs  Lab 11/18/23 1407 11/18/23 1801 11/23/23 0926 11/23/23 1220 11/30/23 0926   TROPONINIHS 25* 22* 68* 60* 28*     Chemistry Recent Labs  Lab 11/24/23 0555 11/30/23 0926  NA 135 137  K 3.5 5.6*  CL 103 101  CO2 24 24  GLUCOSE 94 116*  BUN 25* 21  CREATININE 0.92 1.01*  CALCIUM  8.9 9.8  GFRNONAA >60 58*  ANIONGAP 8 12    No results for input(s): PROT, ALBUMIN, AST, ALT, ALKPHOS, BILITOT in the last  168 hours. Lipids No results for input(s): CHOL, TRIG, HDL, LABVLDL, LDLCALC, CHOLHDL in the last 168 hours.  Hematology Recent Labs  Lab 11/24/23 0555 11/30/23 0926  WBC 6.8 6.5  RBC 4.18 5.19*  HGB 11.0* 13.6  HCT 35.5* 43.9  MCV 84.9 84.6  MCH 26.3 26.2  MCHC 31.0 31.0  RDW 15.7* 15.2  PLT 286 338   Thyroid   Recent Labs  Lab 11/30/23 0926  TSH 1.764    BNPNo results for input(s): BNP, PROBNP in the last 168 hours.  DDimer No results for input(s): DDIMER in the last 168 hours.  Radiology/Studies:  No results found.   Assessment and Plan: Recurrent syncope with orthostatic hypotension - Multiple hospitalizations for syncope and orthostatic hypotension -Previously was to be on pyridostigmine  60 mg tid (stating she never started the medication), midodrine  10 mg 3 times daily and Florinef  0.1 mg twice daily -Patient admits to not being on the currently prescribed medication regimen, not wearing her abdominal binder, and not maintaining adequate hydration -Restarted on PTA medications by ED provider - Orthostatic vital signs - Encourage TED hose, abdominal binder, adequate hydration - Recently evaluated in the [facility] emergency department and was advised there is not a lot left to be done for her recurrent syncope and orthostatic hypotension - She would likely benefit from increase sodium intake in the morning - Pacemaker interrogated and functioning normally  Hyperkalemia -Serum potassium 5.6 -Given Kayexalate in the emergency department -Daily BMP -PTA potassium supplements placed on  hold -Monitor/trend/replete electrolytes as needed  Coronary artery disease -Prior cardiac CTA and subsequent catheterization showed moderate nonobstructive coronary artery disease -She continues to remain chest pain-free -EKG without ischemic changes -Continued on aspirin  and apixaban  -Continued on statin therapy -Continue on telemetry monitoring -EKG as needed for pain or changes  Carotid artery stenosis -Status post right carotid stent -Recent ultrasound revealed a normal blood flow -Ongoing outpatient management per [facility] -Continued on aspirin  and apixaban   Paroxysmal atrial fibrillation - Continued on apixaban  5 mg twice daily for CHA2DS2-VASc score of at least 5 for stroke prophylaxis -Currently a sensed and V paced on telemetry monitoring  Complete heart block status post permanent pacemaker - Normally functioning device - Continue with remote uploads and outpatient follow-up with EP   Risk Assessment/Risk Scores:         CHA2DS2-VASc Score = 5   This indicates a 7.2% annual risk of stroke. The patient's score is based upon: CHF History: 0 HTN History: 0 Diabetes History: 0 Stroke History: 2 Vascular Disease History: 1 Age Score: 1 Gender Score: 1        For questions or updates, please contact [facility] Please consult [email or web address] for contact info under      Signed, Atanacio Melnyk, NP  11/30/2023 11:35 AM;

## 2023-11-30 NOTE — H&P (Signed)
 History and Physical    Diana Cook FMW:[id number] DOB: 12-Mar-1950 DOA: 11/30/2023  PCP: Randeen Laine LABOR, MD   Patient coming from: Home  I have personally briefly reviewed patient's old medical records in [facility]  Chief Complaint: Recurrent syncope, orthostatic hypotension  HPI: Diana Cook is a 73 y.o. female with medical history significant of coronary artery disease, carotid artery stenosis status post right carotid stent, [place] complete heart block status post permanent pacemaker, PAF on anticoagulation, seizure disorder, COPD on chronic 4 L, right breast cancer who is seen in the emergency room with chief complaint of recurrent syncope.  Patient has known severe refractory orthostatic hypotension.  She is followed by [facility] cardiology.  She is on midodrine , Florinef , TED hose, abdominal binder, Mestinon .  She reports intermittent compliance to all of these interventions.  She has had recurrent episodes of syncope and previous hospitalizations with the same complaints.  Recent previous admission her orthostatic syncope was accompanied by a sepsis-like picture with community-acquired pneumonia for which she received treatment.  She presents back again with 3 episodes of syncope on the day of admission.  No head injury.  Given her recurrent syncope and potentially unsafe discharge home admission was requested.  ED Course: Home medications including Florinef , midodrine , Mestinon  restarted by EDP.  Hospitalist contacted for admission.  Review of Systems: As per HPI otherwise 14 point review of systems negative.    Past Medical History:  Diagnosis Date   Allergic rhinitis    Arthritis    Asthma    as a child, mild now   Breast cancer (HCC) 12/2015   right breast cancer, lumpectomy and mammosite    Cardiac pacemaker in situ    Colon polyps    colonoscopy 7/08, tubular adenoma   Complete heart block (HCC) 09/2011   s/p PPM [facility]   COPD (chronic  obstructive pulmonary disease) (HCC)    Myocardial infarction (HCC) 2011   Orthostatic hypotension    Osteopenia 10/2015   Pacemaker    2011   Paroxysmal atrial fibrillation (HCC) 03/2021   Incidentally detected on pacemaker interrogation   Personal history of radiation therapy 2017   right breast ca, mammosite placed   Rotator cuff tear, right 02/2022   Had an MRI in [place] I have 2 tears.   Seizure disorder (HCC)    Seizures (HCC)    first one was when she was 73 years old    Small bowel obstruction (HCC)    1988 and 2002   Tobacco abuse     Past Surgical History:  Procedure Laterality Date   ABDOMINAL HYSTERECTOMY     BREAST BIOPSY Right 2007   benign inflammatory changes, mass due to underwire bra   BREAST BIOPSY Left 01/02/2016   columnar cell changes without atypical hyperplasia.   BREAST BIOPSY Right 12/06/2015   rt breast mass 10:00, bx done at Dr. Gilberto office, invasive ductal carcinoma   BREAST EXCISIONAL BIOPSY Left 01/02/2016   COLUMNAR CELL CHANGE AND HYPERPLASIA ASSOCIATED WITH LUMINAL AND STROMAL CALCIFICATIONS   BREAST LUMPECTOMY Right 01/02/2016   invasive mammary carcinoma, clear margins, negative LN   BREAST LUMPECTOMY WITH SENTINEL LYMPH NODE BIOPSY Right 01/02/2016   pT1c, N0; ER/ PR 100%; Her 2 neu not over expressed: BREAST LUMPECTOMY WITH SENTINEL LYMPH NODE BX;  Surgeon: Reyes LELON Cota, MD;  Location: [facility] ORS;  Service: General;  Laterality: Right;   CARDIAC CATHETERIZATION     CAROTID PTA/STENT INTERVENTION Right  04/29/2022   Procedure: CAROTID PTA/STENT INTERVENTION;  Surgeon: Jama Cordella MATSU, MD;  Location: [facility] INVASIVE CV LAB;  Service: Cardiovascular;  Laterality: Right;   CATARACT EXTRACTION Bilateral    COLONOSCOPY  10/2015   Dr Teressa   COLONOSCOPY WITH PROPOFOL  N/A 08/19/2018   Procedure: COLONOSCOPY WITH PROPOFOL ;  Surgeon: Janalyn Keene NOVAK, MD;  Location: [facility] ENDOSCOPY;  Service: Endoscopy;  Laterality: N/A;    ESOPHAGOGASTRODUODENOSCOPY (EGD) WITH PROPOFOL  N/A 08/19/2018   Procedure: ESOPHAGOGASTRODUODENOSCOPY (EGD) WITH PROPOFOL ;  Surgeon: Janalyn Keene NOVAK, MD;  Location: [facility] ENDOSCOPY;  Service: Endoscopy;  Laterality: N/A;   EXPLORATORY LAPAROTOMY  01/25/2001   Exploratory laparotomy, lysis of adhesions, identification of internal hernia secondary to omental adhesion. Prolonged postoperative ileus.   gyn surgery  1993   hysterectomy- form endometriosis   LAPAROSCOPY     PACEMAKER INSERTION  10/24/11   [employer] Advantio dual chamber PPM implanted by Dr Ronny at [facility] in [place]   PPM GENERATOR CHANGEOUT N/A 12/05/2019   Procedure: PPM GENERATOR HERMA;  Surgeon: Fernande Elspeth BROCKS, MD;  Location: [facility] INVASIVE CV LAB;  Service: Cardiovascular;  Laterality: N/A;   RIGHT/LEFT HEART CATH AND CORONARY ANGIOGRAPHY Bilateral 07/19/2019   Procedure: RIGHT/LEFT HEART CATH AND CORONARY ANGIOGRAPHY;  Surgeon: Mady Bruckner, MD;  Location: [facility] INVASIVE CV LAB;  Service: Cardiovascular;  Laterality: Bilateral;   TEMPORARY PACEMAKER N/A 12/05/2019   Procedure: TEMPORARY PACEMAKER;  Surgeon: Fernande Elspeth BROCKS, MD;  Location: [facility] INVASIVE CV LAB;  Service: Cardiovascular;  Laterality: N/A;     reports that she quit smoking about 13 years ago. Her smoking use included cigarettes. She started smoking about 55 years ago. She has a 63 pack-year smoking history. She has never used smokeless tobacco. She reports current alcohol use of about 1.0 - 2.0 standard drink of alcohol per week. She reports that she does not use drugs.  Allergies  Allergen Reactions   Codeine Nausea And Vomiting   Morphine  And Codeine Nausea Only    Family History  Problem Relation Age of Onset   Stroke Mother    Heart disease Mother 54       MI and CABG   Dementia Mother    Coronary artery disease Father    Parkinsonism Father    Breast cancer Sister    Cancer - Cervical Daughter 53        died 24-Mar-2023   Heart attack Brother 85   Colon cancer Neg Hx     Prior to Admission medications   Medication Sig Start Date End Date Taking? Authorizing Provider  acetaminophen  (TYLENOL ) 500 MG tablet Take 500-1,000 mg by mouth every 6 (six) hours as needed for mild pain or headache.    Yes [provider]  albuterol  (VENTOLIN  HFA) 108 (90 Base) MCG/ACT inhaler INHALE 2 PUFFS BY MOUTH EVERY 4 HOURS AS NEEDED FOR WHEEZING OR SHORTNESS OF BREATH 05/25/23  Yes Tamea Dedra CROME, MD  apixaban  (ELIQUIS ) 5 MG TABS tablet Take 1 tablet (5 mg total) by mouth 2 (two) times daily. 06/17/23  Yes Fernande Elspeth BROCKS, MD  aspirin  EC 81 MG tablet Take 1 tablet (81 mg total) by mouth daily. 12/05/22  Yes Schnier, Cordella MATSU, MD  atorvastatin  (LIPITOR) 40 MG tablet Take 1 tablet by mouth once daily 02/13/23  Yes Fernande Elspeth BROCKS, MD  benzonatate  (TESSALON ) 200 MG capsule Take 1 capsule (200 mg total) by mouth 2 (two) times daily as needed for cough. 03/03/23  Yes Bedsole, Amy  E, MD  Cholecalciferol  (VITAMIN D3) 50 MCG (2000 UT) capsule Take 2,000 Units by mouth daily.   Yes [provider]  fludrocortisone  (FLORINEF ) 0.1mg /mL SUSP Take 2 mLs (0.2 mg total) by mouth in the morning and at bedtime. 11/27/23  Yes Tower, Laine LABOR, MD  FLUoxetine  (PROZAC ) 20 MG capsule Take 1 capsule by mouth once daily 06/23/23  Yes Tower, Laine LABOR, MD  furosemide  (LASIX ) 40 MG tablet Take 40 mg by mouth daily as needed.   Yes [provider]  gabapentin  (NEURONTIN ) 100 MG capsule Take 2 capsules (200 mg total) by mouth 2 (two) times daily as needed. 10/04/23  Yes Beamer, Richerd, MD  ipratropium-albuterol  (DUONEB) 0.5-2.5 (3) MG/3ML SOLN Take 3 mLs by nebulization every 6 (six) hours as needed. 10/30/23  Yes Tamea Dedra CROME, MD  iron  polysaccharides (NIFEREX) 150 MG capsule Take 1 capsule (150 mg total) by mouth daily. 11/02/23  Yes Tower, Laine LABOR, MD  levETIRAcetam  (KEPPRA ) 250 MG tablet Take 250 mg by mouth 2 (two) times  daily.  11/02/19  Yes [provider]  midodrine  (PROAMATINE ) 10 MG tablet Take 1 tablet (10 mg total) by mouth 3 (three) times daily with meals. 10/29/23  Yes Tower, Laine LABOR, MD  ondansetron  (ZOFRAN ) 8 MG tablet Take 1 tablet (8 mg total) by mouth every 8 (eight) hours as needed for nausea or vomiting. 11/27/23  Yes Tower, Laine LABOR, MD  pantoprazole  (PROTONIX ) 40 MG tablet Take 1 tablet (40 mg total) by mouth daily. 11/20/22 11/30/23 Yes Tamea Dedra CROME, MD  potassium chloride  (KLOR-CON ) 10 MEQ tablet Take 3 tablets (30 mEq total) by mouth daily. 10/29/23  Yes Tower, Laine LABOR, MD  pyridostigmine  (MESTINON ) 60 MG tablet Take 1 tablet (60 mg total) by mouth 3 (three) times daily. 09/25/23  Yes Furth, Cadence H, PA-C  zolpidem  (AMBIEN ) 10 MG tablet Take 5-10 mg by mouth at bedtime as needed. 11/05/23  Yes [provider]  Nebulizers (COMPRESSOR/NEBULIZER) MISC For use with medication for the lungs.  Use as directed. 10/30/23   Tamea Dedra CROME, MD  OXYGEN  Inhale 4 L into the lungs continuous.    [provider]    Physical Exam: Vitals:   11/30/23 0917 11/30/23 0930 11/30/23 1000 11/30/23 1030  BP: (!) 113/55 118/62 137/60 (!) 143/77  Pulse: 72 70 64 61  Resp: 17 17 20 20   Temp: (!) 97.5 F (36.4 C)     TempSrc: Oral     SpO2: 97% 93% 95% 92%  Weight:      Height:       General: No apparent distress, patient appears well HEENT: Normocephalic, atraumatic Neck, supple, trachea midline, no tenderness Heart: Regular rate and rhythm, S1/S2 normal, no murmurs Lungs: Decreased breath sounds bilaterally.  Normal work of breathing.  No wheezes.  4 L Abdomen: Soft, nontender, nondistended, positive bowel sounds Extremities: Normal, atraumatic, no clubbing or cyanosis, normal muscle tone Skin: No rashes or lesions, normal color Neurologic: Cranial nerves grossly intact, sensation intact, alert and oriented x3 Psychiatric: Normal affect  Labs on Admission: I have personally  reviewed following labs and imaging studies  CBC: Recent Labs  Lab 11/24/23 0555 11/30/23 0926  WBC 6.8 6.5  HGB 11.0* 13.6  HCT 35.5* 43.9  MCV 84.9 84.6  PLT 286 338   Basic Metabolic Panel: Recent Labs  Lab 11/24/23 0555 11/30/23 0926  NA 135 137  K 3.5 5.6*  CL 103 101  CO2 24 24  GLUCOSE 94 116*  BUN 25* 21  CREATININE 0.92 1.01*  CALCIUM  8.9 9.8   GFR: Estimated Creatinine Clearance: 55 mL/min (A) (by C-G formula based on SCr of 1.01 mg/dL (H)). Liver Function Tests: No results for input(s): AST, ALT, ALKPHOS, BILITOT, PROT, ALBUMIN in the last 168 hours. Recent Labs  Lab 11/23/23 1220  LIPASE 15   No results for input(s): AMMONIA in the last 168 hours. Coagulation Profile: Recent Labs  Lab 11/24/23 0555  INR 1.5*   Cardiac Enzymes: No results for input(s): CKTOTAL, CKMB, CKMBINDEX, TROPONINI in the last 168 hours. BNP (last 3 results) No results for input(s): PROBNP in the last 8760 hours. HbA1C: No results for input(s): HGBA1C in the last 72 hours. CBG: No results for input(s): GLUCAP in the last 168 hours. Lipid Profile: No results for input(s): CHOL, HDL, LDLCALC, TRIG, CHOLHDL, LDLDIRECT in the last 72 hours. Thyroid  Function Tests: Recent Labs    11/30/23 0926  TSH 1.764   Anemia Panel: No results for input(s): VITAMINB12, FOLATE, FERRITIN, TIBC, IRON , RETICCTPCT in the last 72 hours. Urine analysis:    Component Value Date/Time   COLORURINE YELLOW (A) 11/18/2023 1743   APPEARANCEUR HAZY (A) 11/18/2023 1743   APPEARANCEUR Clear 04/01/2013 0815   LABSPEC 1.014 11/18/2023 1743   LABSPEC >1.060 04/01/2013 0815   PHURINE 5.0 11/18/2023 1743   GLUCOSEU NEGATIVE 11/18/2023 1743   GLUCOSEU NEGATIVE 05/05/2023 1410   HGBUR SMALL (A) 11/18/2023 1743   BILIRUBINUR NEGATIVE 11/18/2023 1743   BILIRUBINUR Negative 02/24/2023 0951   BILIRUBINUR Negative 04/01/2013 0815   KETONESUR NEGATIVE  11/18/2023 1743   PROTEINUR NEGATIVE 11/18/2023 1743   UROBILINOGEN 0.2 05/05/2023 1410   NITRITE NEGATIVE 11/18/2023 1743   LEUKOCYTESUR TRACE (A) 11/18/2023 1743   LEUKOCYTESUR Negative 04/01/2013 0815    Radiological Exams on Admission: No results found.  EKG: Independently reviewed. V paced  Assessment/Plan Principal Problem:   Recurrent syncope  Recurrent syncope Orthostatic hypotension Given multiple recent syncopal events patient is unsafe to discharge home at this time.  Will request a bed for observation. Plan: Continue Florinef  Continue midodrine  Continue Mestinon  Abdominal binder Thigh-high TED hose PT evaluations Fall precautions  Complete heart block status post permanent pacemaker Device interrogated Functioning normally  Hyperkalemia Stop daily potassium supplementation for now Status post Kayexalate in the ED Trend potassium  CAD Chest pain-free Continue aspirin  and apixaban  Continue statin Telemetry  PAF Continue Eliquis  V-paced   COPD Chronic hypoxic respiratory failure No evidence of exacerbation Continue home rate oxygen   Seizure disorder Continue home Keppra   Depression Prozac   Iron  deficiency anemia Continue home Niferex  DVT prophylaxis: Eliquis  Code Status: DNR limited Family Communication: Spouse at bedside 10/6 Disposition Plan: Anticipate return to previous home environment Consults called: Cardiology-[facility] Admission status: Observation, medical telemetry   Calvin KATHEE Robson MD [facility]   If 7PM-7AM, please contact night-coverage  11/30/2023, 11:20 AM

## 2023-11-30 NOTE — ED Notes (Signed)
 Called CCMD to place pt on central monitoring

## 2023-11-30 NOTE — ED Notes (Signed)
 Pt made slight movement in bed and bed alarm went off. Pt requested for bed alarm to be turned off. States that she will not get up without calling out. Pt A&Ox4. Husband at bedside. Bed alarm turned off per patients request

## 2023-11-30 NOTE — Progress Notes (Signed)
 Remote PPM Transmission

## 2023-11-30 NOTE — Telephone Encounter (Signed)
 That is why  I think they gave her liquid in hospital- but she can take pills She is back in the hospital now and I suspect she will be discharged with it   I want to go ahead and send to [facility]  (can cancel if it gets stopped )  I am trying to send but it says the prescription is lacking demographic info for provider ?  - won't let me sent it electronically  ?  Would not let me pend it    Thanks for your help!

## 2023-12-01 DIAGNOSIS — Z7982 Long term (current) use of aspirin: Secondary | ICD-10-CM | POA: Diagnosis not present

## 2023-12-01 DIAGNOSIS — Q048 Other specified congenital malformations of brain: Secondary | ICD-10-CM | POA: Diagnosis not present

## 2023-12-01 DIAGNOSIS — F32A Depression, unspecified: Secondary | ICD-10-CM | POA: Diagnosis present

## 2023-12-01 DIAGNOSIS — Z7901 Long term (current) use of anticoagulants: Secondary | ICD-10-CM | POA: Diagnosis not present

## 2023-12-01 DIAGNOSIS — Z9981 Dependence on supplemental oxygen: Secondary | ICD-10-CM | POA: Diagnosis not present

## 2023-12-01 DIAGNOSIS — I951 Orthostatic hypotension: Secondary | ICD-10-CM | POA: Diagnosis present

## 2023-12-01 DIAGNOSIS — E875 Hyperkalemia: Secondary | ICD-10-CM | POA: Diagnosis present

## 2023-12-01 DIAGNOSIS — E876 Hypokalemia: Secondary | ICD-10-CM | POA: Diagnosis not present

## 2023-12-01 DIAGNOSIS — Z8249 Family history of ischemic heart disease and other diseases of the circulatory system: Secondary | ICD-10-CM | POA: Diagnosis not present

## 2023-12-01 DIAGNOSIS — G40909 Epilepsy, unspecified, not intractable, without status epilepticus: Secondary | ICD-10-CM | POA: Diagnosis present

## 2023-12-01 DIAGNOSIS — R9089 Other abnormal findings on diagnostic imaging of central nervous system: Secondary | ICD-10-CM | POA: Diagnosis not present

## 2023-12-01 DIAGNOSIS — I25118 Atherosclerotic heart disease of native coronary artery with other forms of angina pectoris: Secondary | ICD-10-CM | POA: Diagnosis not present

## 2023-12-01 DIAGNOSIS — R55 Syncope and collapse: Secondary | ICD-10-CM | POA: Diagnosis present

## 2023-12-01 DIAGNOSIS — Y92230 Patient room in hospital as the place of occurrence of the external cause: Secondary | ICD-10-CM | POA: Diagnosis not present

## 2023-12-01 DIAGNOSIS — D509 Iron deficiency anemia, unspecified: Secondary | ICD-10-CM | POA: Diagnosis present

## 2023-12-01 DIAGNOSIS — G4700 Insomnia, unspecified: Secondary | ICD-10-CM | POA: Diagnosis present

## 2023-12-01 DIAGNOSIS — Z923 Personal history of irradiation: Secondary | ICD-10-CM | POA: Diagnosis not present

## 2023-12-01 DIAGNOSIS — E785 Hyperlipidemia, unspecified: Secondary | ICD-10-CM | POA: Diagnosis present

## 2023-12-01 DIAGNOSIS — Z87891 Personal history of nicotine dependence: Secondary | ICD-10-CM | POA: Diagnosis not present

## 2023-12-01 DIAGNOSIS — Z66 Do not resuscitate: Secondary | ICD-10-CM | POA: Diagnosis present

## 2023-12-01 DIAGNOSIS — J4489 Other specified chronic obstructive pulmonary disease: Secondary | ICD-10-CM | POA: Diagnosis present

## 2023-12-01 DIAGNOSIS — W06XXXA Fall from bed, initial encounter: Secondary | ICD-10-CM | POA: Diagnosis not present

## 2023-12-01 DIAGNOSIS — J9611 Chronic respiratory failure with hypoxia: Secondary | ICD-10-CM | POA: Diagnosis present

## 2023-12-01 DIAGNOSIS — R531 Weakness: Secondary | ICD-10-CM | POA: Diagnosis not present

## 2023-12-01 DIAGNOSIS — Z8669 Personal history of other diseases of the nervous system and sense organs: Secondary | ICD-10-CM | POA: Diagnosis not present

## 2023-12-01 DIAGNOSIS — G909 Disorder of the autonomic nervous system, unspecified: Secondary | ICD-10-CM | POA: Diagnosis present

## 2023-12-01 DIAGNOSIS — I10 Essential (primary) hypertension: Secondary | ICD-10-CM | POA: Diagnosis present

## 2023-12-01 DIAGNOSIS — Z79899 Other long term (current) drug therapy: Secondary | ICD-10-CM | POA: Diagnosis not present

## 2023-12-01 DIAGNOSIS — I480 Paroxysmal atrial fibrillation: Secondary | ICD-10-CM | POA: Diagnosis present

## 2023-12-01 DIAGNOSIS — C719 Malignant neoplasm of brain, unspecified: Secondary | ICD-10-CM | POA: Diagnosis present

## 2023-12-01 DIAGNOSIS — I442 Atrioventricular block, complete: Secondary | ICD-10-CM | POA: Diagnosis present

## 2023-12-01 LAB — BASIC METABOLIC PANEL WITH GFR
Anion gap: 9 (ref 5–15)
BUN: 22 mg/dL (ref 8–23)
CO2: 24 mmol/L (ref 22–32)
Calcium: 9.1 mg/dL (ref 8.9–10.3)
Chloride: 102 mmol/L (ref 98–111)
Creatinine, Ser: 0.63 mg/dL (ref 0.44–1.00)
GFR, Estimated: >60 mL/min (ref >60)
Glucose, Bld: 101 mg/dL — ABNORMAL HIGH (ref 70–99)
Potassium: 3.5 mmol/L (ref 3.5–5.1)
Sodium: 135 mmol/L (ref 135–145)

## 2023-12-01 LAB — CBC
HCT: 37.4 % (ref 36.0–46.0)
Hemoglobin: 11.9 g/dL — ABNORMAL LOW (ref 12.0–15.0)
MCH: 26.4 pg (ref 26.0–34.0)
MCHC: 31.8 g/dL (ref 30.0–36.0)
MCV: 82.9 fL (ref 80.0–100.0)
Platelets: 300 K/uL (ref 150–400)
RBC: 4.51 MIL/uL (ref 3.87–5.11)
RDW: 15.4 % (ref 11.5–15.5)
WBC: 8.1 K/uL (ref 4.0–10.5)
nRBC: 0 % (ref 0.0–0.2)

## 2023-12-01 MED ORDER — SODIUM CHLORIDE 0.9 % IV SOLN: INTRAVENOUS | Status: AC

## 2023-12-01 NOTE — Progress Notes (Signed)
 Rounding Note   Patient Name: Diana Cook Date of Encounter: 12/01/2023  [facility] Cardiologist: Lonni Hanson, MD   Subjective Patient lying in bed, reports feeling better today; blood pressures in 140s to 150s systolic. Has been wearing TED hose and abdominal binder since arrival to hospital.  Has not tried standing up or walking around since admission.  Reports onset of epilepsy in their 30s that they were on medication for; decades ago stopped the medication and found that seizures had self resolved.  Scheduled Meds:  apixaban   5 mg Oral BID   aspirin  EC  81 mg Oral Daily   atorvastatin   40 mg Oral Daily   fludrocortisone   0.1 mg Oral BID   FLUoxetine   20 mg Oral Daily   iron  polysaccharides  150 mg Oral Daily   levETIRAcetam   250 mg Oral BID   midodrine   10 mg Oral TID WC   polyethylene glycol  17 g Oral Daily   pyridostigmine   60 mg Oral TID   senna-docusate  1 tablet Oral BID   Continuous Infusions:  sodium chloride      PRN Meds: acetaminophen  **OR** acetaminophen , benzonatate , bisacodyl , ipratropium-albuterol , ondansetron  **OR** ondansetron  (ZOFRAN ) IV, oxyCODONE , zolpidem    Vital Signs  Vitals:   11/30/23 1457 11/30/23 2000 12/01/23 0401 12/01/23 0746  BP: (!) 127/96 (!) 142/70 (!) 151/74 (!) 142/74  Pulse: 68 70 64 68  Resp: 18  16   Temp: 98 F (36.7 C) 98.1 F (36.7 C) 98 F (36.7 C) 98.4 F (36.9 C)  TempSrc:  Oral  Oral  SpO2: 96% 96% 97% 99%  Weight:      Height:        Intake/Output Summary (Last 24 hours) at 12/01/2023 1236 Last data filed at 12/01/2023 0220 Gross per 24 hour  Intake 350.04 ml  Output 100 ml  Net 250.04 ml      11/30/2023    9:15 AM 11/24/2023    5:35 AM 11/23/2023    9:15 AM  Last 3 Weights  Weight (lbs) 180 lb 186 lb 11.7 oz 189 lb 9.5 oz  Weight (kg) 81.647 kg 84.7 kg 86 kg      Telemetry A sensed V paced rhythm with rate in the 60s- Personally Reviewed  ECG  No new tracings- Personally  Reviewed  Physical Exam GEN: No acute distress.   Neck: No JVD Cardiac: RRR, no murmurs, rubs, or gallops.  Respiratory: Clear to auscultation bilaterally. GI: Soft, nontender, non-distended  MS: No edema; No deformity. Neuro:  Nonfocal  Psych: Normal affect   Labs High Sensitivity Troponin:   Recent Labs  Lab 11/18/23 1801 11/23/23 0926 11/23/23 1220 11/30/23 0926 11/30/23 1231  TROPONINIHS 22* 68* 60* 28* 24*     Chemistry Recent Labs  Lab 11/30/23 0926 12/01/23 0421  NA 137 135  K 5.6* 3.5  CL 101 102  CO2 24 24  GLUCOSE 116* 101*  BUN 21 22  CREATININE 1.01* 0.63  CALCIUM  9.8 9.1  GFRNONAA 58* >60  ANIONGAP 12 9    Lipids No results for input(s): CHOL, TRIG, HDL, LABVLDL, LDLCALC, CHOLHDL in the last 168 hours.  Hematology Recent Labs  Lab 11/30/23 0926 12/01/23 0421  WBC 6.5 8.1  RBC 5.19* 4.51  HGB 13.6 11.9*  HCT 43.9 37.4  MCV 84.6 82.9  MCH 26.2 26.4  MCHC 31.0 31.8  RDW 15.2 15.4  PLT 338 300   Thyroid   Recent Labs  Lab 11/30/23 0926  TSH 1.764  BNPNo results for input(s): BNP, PROBNP in the last 168 hours.  DDimer No results for input(s): DDIMER in the last 168 hours.   Radiology  No results found.  Cardiac Studies Echo 07/2023 1. Left ventricular ejection fraction, by estimation, is 55 to 60%. Left  ventricular ejection fraction by 2D MOD biplane is 56.1 %. The left  ventricle has normal function. The left ventricle has no regional wall  motion abnormalities. Left ventricular  diastolic parameters are consistent with Grade I diastolic dysfunction  (impaired relaxation).   2. Right ventricular systolic function is normal. The right ventricular  size is normal.   3. The mitral valve is normal in structure. Trivial mitral valve  regurgitation.   4. The aortic valve is tricuspid. Aortic valve regurgitation is not  visualized.    Carotid US  05/2023 Vertebrals:  Bilateral vertebral arteries demonstrate  antegrade flow.  Subclavians: Normal flow hemodynamics were seen in bilateral subclavian arteries.    2d echo 07/16/21   1. Left ventricular ejection fraction, by estimation, is 55 to 60%. The  left ventricle has normal function. Septal wall motion abnormality  consistent with bundle branch block.   2. There is mild left ventricular hypertrophy. Left ventricular diastolic  parameters are consistent with Grade I diastolic dysfunction (impaired  relaxation).   3. Right ventricular systolic function is normal. The right ventricular  size is normal. There is normal pulmonary artery systolic pressure. The  estimated right ventricular systolic pressure is 31.6 mmHg.   4. The mitral valve is normal in structure. No evidence of mitral valve  regurgitation. No evidence of mitral stenosis.   5. The aortic valve was not well visualized. Aortic valve regurgitation  is not visualized. No aortic stenosis is present.   6. The inferior vena cava is normal in size with greater than 50%  respiratory variability, suggesting right atrial pressure of 3 mmHg.     Patient Profile   73 y.o. female with a hx of moderate coronary artery disease by cardiac CTA (11/2019) and cardiac cath (5/21), carotid artery stenosis status post right carotid stenting (04/2022), complete heart block status post permanent pacemaker followed by EP (last gen change 2021), paroxysmal atrial fibrillation, seizure disorder, COPD on chronic 4 L of O2, right breast cancer, who is being seen for the evaluation of syncope at the request of Dr Jhonny .  Assessment & Plan  Recurrent syncope with orthostatic hypotension - Multiple hospitalizations for syncope and orthostatic hypotension -Previously was to be on pyridostigmine  60 mg tid (stating she never started the medication), midodrine  10 mg 3 times daily and Florinef  0.1 mg twice daily -Patient admitted to not being on the currently prescribed medication regimen, not wearing her abdominal  binder, and not maintaining adequate hydration -Restarted on PTA medications by ED provider - Continue to encourage TED hose, abdominal binder, adequate hydration - Recently evaluated in the [facility] emergency department and was advised there is not a lot left to be done for her recurrent syncope and orthostatic hypotension - She would likely benefit from increased sodium intake in the morning - Pacemaker interrogated and functioning normally -Continue to check orthostatic vital signs -If continues to be symptomatic in spite of current medication regimen consisting of midodrine , Mestinon , and Florinef , will consider Northera.   Hyperkalemia -Serum potassium 3.5 -Given Kayexalate in the emergency department on arrival for serum potassium 5.6 -Daily BMP -PTA potassium supplements placed on hold -Monitor/trend/replete electrolytes as needed   Coronary artery disease -Prior cardiac CTA and subsequent  catheterization showed moderate nonobstructive coronary artery disease -She continues to remain chest pain-free -EKG without ischemic changes -Continued on aspirin  and apixaban  -Continued on statin therapy -Continue on telemetry monitoring -EKG as needed for pain or changes   Carotid artery stenosis -Status post right carotid stent -Recent ultrasound revealed a normal blood flow -Ongoing outpatient management per [facility] -Continued on aspirin  and apixaban    Paroxysmal atrial fibrillation - Continued on apixaban  5 mg twice daily for CHA2DS2-VASc score of at least 5 for stroke prophylaxis -Currently a sensed and V paced on telemetry monitoring   Complete heart block status post permanent pacemaker - Normally functioning device - Continue with remote uploads and outpatient follow-up with EP  7.  Seizure history - Reports onset of epilepsy in their 30s that they were on medication for; decades ago stopped the medication and found that seizures had self resolved. - Patient never remembered the  seizures, just waking up on the floor and feeling clammy which they liken to current episodes of syncope.   - Does not get dizzy or lightheaded prior to current episodes of syncope but does have documented orthostatic hypotension. - Recommend neurology consult to evaluate further    For questions or updates, please contact [facility] Please consult [email or web address] for contact info under       Signed, Jernie Schutt, NP  12/01/2023, 12:36 PM

## 2023-12-01 NOTE — Evaluation (Signed)
 Physical Therapy Evaluation Patient Details Name: Diana Cook MRN: [id number] DOB: 1950/08/01 Today's Date: 12/01/2023  History of Present Illness  73 y.o. female with PMHx of CAD, carotid artery stenosis s/p right carotid stent, Dorette complete heart block s/p permanent pacemaker, PAF on anticoagulation, seizure disorder, COPD on chronic 4 L, right breast cancer, and known history of severe refractory orthostatic hypotension who is seen in the emergency room with chief complaint of recurrent syncope.  Clinical Impression  Patient resting in bed upon arrival to session; alert and oriented, follows commands and agreeable to participation with session.  Denies pain; does endorse persistent dizziness with attempts to transition upright. Bilat UE/LE strength and ROM grossly symmetrical and WFL for basic transfers and gait; no focal weakness appreciated.  Currently completes bed mobility with mod indep; unsupported sitting balance with close sup; sit/stand and static standing balance (5-8 seconds) with RW, mod assist. Unable to tolerate standing >5-8 seconds due to change in responsiveness, pallor, pre-syncopal (requiring return to seated/supine position for recovery).  Significant drop in BP noted with transition to upright (see vitals flowsheet for details). Additional transfers introduced completed via lateral scoot pivot (over level surface), close sup.  Fair/good upper/lower body strength required to complete; minimal reports of dizziness/lightheadness with efforts.  Tolerating sustained sitting (semi-reclined) position well end of session. Educated on role of remaining WC level (latera/scoot pivot transfers vs stand pivot transfers) to optimize tolerance to upright/mobility; reviewed use of removeable armrests on WC and benefit of DABSC; reinforced importance of abdominal binder (recommend donning in supine) and TEDs.  Patient voiced understanding of all information. Would benefit from skilled PT to  address above deficits and promote optimal return to PLOF.; recommend post-acute PT follow up as indicated by interdisciplinary care team.          If plan is discharge home, recommend the following: A lot of help with walking and/or transfers;A little help with bathing/dressing/bathroom   Can travel by private vehicle        Equipment Recommendations  [facility] with removeable armrests and DABSC (if current equipment does not allow))  Recommendations for Other Services       Functional Status Assessment Patient has had a recent decline in their functional status and demonstrates the ability to make significant improvements in function in a reasonable and predictable amount of time.     Precautions / Restrictions Precautions Precautions: Fall Recall of Precautions/Restrictions: Intact Precaution/Restrictions Comments: orthostatic - careful! Restrictions Weight Bearing Restrictions Per Provider Order: No      Mobility  Bed Mobility Overal bed mobility: Modified Independent                  Transfers Overall transfer level: Needs assistance   Transfers: Sit to/from Stand, Bed to chair/wheelchair/BSC Sit to Stand: Min assist          Lateral/Scoot Transfers: Supervision General transfer comment: unable to tolerate standing >5-8 seconds due to change in responsiveness, pallor, pre-syncopal (requiring return to seated/supine position for recovery)    Ambulation/Gait               General Gait Details: unsafe/unable secondary to significant orthostasis/pre-syncope  Stairs            Wheelchair Mobility     Tilt Bed    Modified Rankin (Stroke Patients Only)       Balance Overall balance assessment: Needs assistance Sitting-balance support: No upper extremity supported, Feet supported Sitting balance-Leahy Scale: Good     Standing balance  support: Bilateral upper extremity supported Standing balance-Leahy Scale: Poor Standing balance comment:  heavily reliant on RW, poor tolerance for position; pre-syncopal with transition                             Pertinent Vitals/Pain Pain Assessment Pain Assessment: No/denies pain    Home Living Family/patient expects to be discharged to:: Private residence Living Arrangements: Spouse/significant other Available Help at Discharge: Family;Available 24 hours/day Type of Home: House Home Access: Level entry       Home Layout: One level Home Equipment: Rollator (4 wheels);Grab bars - tub/shower;Shower Counsellor (2 wheels);Wheelchair - manual;BSC/3in1;Adaptive equipment;Hand held shower head Additional Comments: 4L O2 at home    Prior Function Prior Level of Function : Needs assist;History of Falls (last six months)             Mobility Comments: Since June, pt reports progressively worse, pt has been most recently been using w/c self propelling with BLE and pivot transfers to/from other surfaces; estimates 2-3 falls in last 72mo from passing out ADLs Comments: mod indep with ADL (seated sink bath), spouse helping with stockings, cooking, cleaning, driving, etc since June, indep wiht med mgt     Extremity/Trunk Assessment   Upper Extremity Assessment Upper Extremity Assessment: Overall WFL for tasks assessed    Lower Extremity Assessment Lower Extremity Assessment: Generalized weakness (grossly at least 4-/5 throughout)       Communication        Cognition Arousal: Alert Behavior During Therapy: [facility] for tasks assessed/performed   PT - Cognitive impairments: No apparent impairments                                 Cueing       General Comments General comments (skin integrity, edema, etc.): Pt on 3L O2, SpO2 87-88%. Bumped to 4L (pt reports baseline 4) and improved to 89-90%    Exercises Other Exercises Other Exercises: Dep assist for bedpan placement, hygiene for continent BM initially upon entering room.  Rolling bilat, mod  indep; bridging, lateral shifting, rolling to manage lower body clothing and to don abdominal binder, mod indep. Other Exercises: Scoot pivot transfer, bed/chair, close sup.  Tolerated activity well with minimal syncopal symptoms reported. Other Exercises: Educated on role of remaining WC level (latera/scoot pivot transfers vs stand pivot transfers) to optimize tolerance to upright/mobility; reviewed use of removeable armrests on WC and benefit of DABSC; reinforced importance of abdominal binder (recommend donning in supine) and TEDs.  Patient voiced understanding of all information.   Assessment/Plan    PT Assessment Patient needs continued PT services  PT Problem List Decreased strength;Decreased activity tolerance;Decreased balance;Decreased mobility;Decreased knowledge of use of DME;Decreased safety awareness;Decreased knowledge of precautions;Cardiopulmonary status limiting activity       PT Treatment Interventions DME instruction;Functional mobility training;Therapeutic activities;Therapeutic exercise;Balance training;Patient/family education    PT Goals (Current goals can be found in the Care Plan section)  Acute Rehab PT Goals Patient Stated Goal: to return home PT Goal Formulation: With patient Time For Goal Achievement: 12/15/23 Potential to Achieve Goals: Fair    Frequency Min 2X/week     Co-evaluation               AM-PAC PT 6 Clicks Mobility  Outcome Measure Help needed turning from your back to your side while in a flat bed without using bedrails?: None  Help needed moving from lying on your back to sitting on the side of a flat bed without using bedrails?: None Help needed moving to and from a bed to a chair (including a wheelchair)?: None Help needed standing up from a chair using your arms (e.g., wheelchair or bedside chair)?: A Lot Help needed to walk in hospital room?: Total Help needed climbing 3-5 steps with a railing? : Total 6 Click Score: 16    End  of Session Equipment Utilized During Treatment: Gait belt Activity Tolerance: Patient tolerated treatment well;Treatment limited secondary to medical complications (Comment) Patient left: in chair;with call bell/phone within reach;with chair alarm set Nurse Communication: Mobility status PT Visit Diagnosis: Muscle weakness (generalized) (M62.81)    Time: 8675-8583 PT Time Calculation (min) (ACUTE ONLY): 52 min   Charges:   PT Evaluation $PT Eval High Complexity: 1 High PT Treatments $Therapeutic Activity: 23-37 mins PT General Charges $$ ACUTE PT VISIT: 1 Visit         Jospeh Mangel H. Delores, PT, DPT, NCS 12/01/23, 4:41 PM [phone]

## 2023-12-01 NOTE — Care Management Obs Status (Signed)
 MEDICARE OBSERVATION STATUS NOTIFICATION   Patient Details  Name: Diana Cook MRN: [id number] Date of Birth: 01-16-1951   Medicare Observation Status Notification Given:  Yes    Rojelio SHAUNNA Rattler 12/01/2023, 12:16 PM

## 2023-12-01 NOTE — Progress Notes (Signed)
 PROGRESS NOTE    Diana Cook  FMW:[id number] DOB: [birth year] DOA: 11/30/2023 PCP: Randeen Laine LABOR, MD    Brief Narrative:   Diana Cook is a 73 y.o. female with medical history significant of coronary artery disease, carotid artery stenosis status post right carotid stent, Dorette complete heart block status post permanent pacemaker, PAF on anticoagulation, seizure disorder, COPD on chronic 4 L, right breast cancer who is seen in the emergency room with chief complaint of recurrent syncope.   Patient has known severe refractory orthostatic hypotension.  She is followed by [facility] cardiology.  She is on midodrine , Florinef , TED hose, abdominal binder, Mestinon .  She reports intermittent compliance to all of these interventions.   She has had recurrent episodes of syncope and previous hospitalizations with the same complaints.  Recent previous admission her orthostatic syncope was accompanied by a sepsis-like picture with community-acquired pneumonia for which she received treatment.  She presents back again with 3 episodes of syncope on the day of admission.   Assessment & Plan:   Principal Problem:   Recurrent syncope   Recurrent syncope Orthostatic hypotension Given multiple recent syncopal events patient is unsafe to discharge home at this time.  Patient has refractory orthostatic hypotension with multiple admissions for same complaint Plan: Continue Florinef  Continue midodrine  Continue Mestinon  Abdominal binder Thigh-high TED hose PT evaluations Fall precautions If patient is still orthostatic and unable to safely discharge cardiology to consider addition of Northera   Complete heart block status post permanent pacemaker Device interrogated Functioning normally   Hyperkalemia Stop daily potassium supplementation for now Status post Kayexalate in the ED Trend potassium Within normal limits   CAD Chest pain-free Continue aspirin  and apixaban  Continue  statin Telemetry   PAF Continue Eliquis  V-paced    COPD Chronic hypoxic respiratory failure No evidence of exacerbation Continue home rate oxygen    Seizure disorder Continue home Keppra    Depression Prozac    Iron  deficiency anemia Continue home Niferex   DVT prophylaxis: Eliquis  Code Status: DNR limited Family Communication: Spouse at bedside 10/7 Disposition Plan: Status is: Inpatient Remains inpatient appropriate because: Severe orthostatic hypotension.  Inability to ambulate.   Level of care: Telemetry Medical  Consultants:  Cardiology-[facility]  Procedures:  None  Antimicrobials: None   Subjective: Seen and examined.  Resting in bed.  Reports persistent symptoms of orthostasis  Objective: Vitals:   11/30/23 1457 11/30/23 2000 12/01/23 0401 12/01/23 0746  BP: (!) 127/96 (!) 142/70 (!) 151/74 (!) 142/74  Pulse: 68 70 64 68  Resp: 18  16   Temp: 98 F (36.7 C) 98.1 F (36.7 C) 98 F (36.7 C) 98.4 F (36.9 C)  TempSrc:  Oral  Oral  SpO2: 96% 96% 97% 99%  Weight:      Height:        Intake/Output Summary (Last 24 hours) at 12/01/2023 1511 Last data filed at 12/01/2023 1455 Gross per 24 hour  Intake 530.04 ml  Output 350 ml  Net 180.04 ml   Filed Weights   11/30/23 0915  Weight: 81.6 kg    Examination:  General exam: Appears calm and comfortable  Respiratory system: Clear to auscultation. Respiratory effort normal. Cardiovascular system: S1-S2, RRR, no murmurs, no pedal edema Gastrointestinal system: Soft, T/ND, normal bowel sounds Central nervous system: Alert and oriented. No focal neurological deficits. Extremities: Symmetric 5 x 5 power. Skin: No rashes, lesions or ulcers Psychiatry: Judgement and insight appear normal. Mood & affect appropriate.     Data Reviewed: I have  personally reviewed following labs and imaging studies  CBC: Recent Labs  Lab 11/30/23 0926 12/01/23 0421  WBC 6.5 8.1  HGB 13.6 11.9*  HCT 43.9 37.4  MCV  84.6 82.9  PLT 338 300   Basic Metabolic Panel: Recent Labs  Lab 11/30/23 0926 12/01/23 0421  NA 137 135  K 5.6* 3.5  CL 101 102  CO2 24 24  GLUCOSE 116* 101*  BUN 21 22  CREATININE 1.01* 0.63  CALCIUM  9.8 9.1   GFR: Estimated Creatinine Clearance: 69.4 mL/min (by C-G formula based on SCr of 0.63 mg/dL). Liver Function Tests: No results for input(s): AST, ALT, ALKPHOS, BILITOT, PROT, ALBUMIN in the last 168 hours. No results for input(s): LIPASE, AMYLASE in the last 168 hours. No results for input(s): AMMONIA in the last 168 hours. Coagulation Profile: No results for input(s): INR, PROTIME in the last 168 hours. Cardiac Enzymes: No results for input(s): CKTOTAL, CKMB, CKMBINDEX, TROPONINI in the last 168 hours. BNP (last 3 results) No results for input(s): PROBNP in the last 8760 hours. HbA1C: No results for input(s): HGBA1C in the last 72 hours. CBG: No results for input(s): GLUCAP in the last 168 hours. Lipid Profile: No results for input(s): CHOL, HDL, LDLCALC, TRIG, CHOLHDL, LDLDIRECT in the last 72 hours. Thyroid  Function Tests: Recent Labs    11/30/23 0926  TSH 1.764   Anemia Panel: No results for input(s): VITAMINB12, FOLATE, FERRITIN, TIBC, IRON , RETICCTPCT in the last 72 hours. Sepsis Labs: No results for input(s): PROCALCITON, LATICACIDVEN in the last 168 hours.  Recent Results (from the past 240 hours)  Blood Culture (routine x 2)     Status: None   Collection Time: 11/23/23  3:10 PM   Specimen: BLOOD  Result Value Ref Range Status   Specimen Description BLOOD BLOOD RIGHT ARM  Final   Special Requests   Final    BOTTLES DRAWN AEROBIC ONLY Blood Culture results may not be optimal due to an inadequate volume of blood received in culture bottles   Culture   Final    NO GROWTH 5 DAYS Performed at [facility], [address] [place], [place] [address]    Report Status 11/28/2023  FINAL  Final  Resp panel by RT-PCR (RSV, Flu A&B, Covid) Anterior Nasal Swab     Status: None   Collection Time: 11/23/23  4:38 PM   Specimen: Anterior Nasal Swab  Result Value Ref Range Status   SARS Coronavirus 2 by RT PCR NEGATIVE NEGATIVE Final    Comment: (NOTE) SARS-CoV-2 target nucleic acids are NOT DETECTED.  The SARS-CoV-2 RNA is generally detectable in upper respiratory specimens during the acute phase of infection. The lowest concentration of SARS-CoV-2 viral copies this assay can detect is 138 copies/mL. A negative result does not preclude SARS-Cov-2 infection and should not be used as the sole basis for treatment or other patient management decisions. A negative result may occur with  improper specimen collection/handling, submission of specimen other than nasopharyngeal swab, presence of viral mutation(s) within the areas targeted by this assay, and inadequate number of viral copies(<138 copies/mL). A negative result must be combined with clinical observations, patient history, and epidemiological information. The expected result is Negative.  Fact Sheet for Patients:  BloggerCourse.com  Fact Sheet for Healthcare Providers:  SeriousBroker.it  This test is no t yet approved or cleared by the [place]  FDA and  has been authorized for detection and/or diagnosis of SARS-CoV-2 by FDA under an Emergency Use Authorization (EUA). This  EUA will remain  in effect (meaning this test can be used) for the duration of the COVID-19 declaration under Section 564(b)(1) of the Act, 21 U.S.C.section 360bbb-3(b)(1), unless the authorization is terminated  or revoked sooner.       Influenza A by PCR NEGATIVE NEGATIVE Final   Influenza B by PCR NEGATIVE NEGATIVE Final    Comment: (NOTE) The Xpert Xpress SARS-CoV-2/FLU/RSV plus assay is intended as an aid in the diagnosis of influenza from Nasopharyngeal swab specimens and should  not be used as a sole basis for treatment. Nasal washings and aspirates are unacceptable for Xpert Xpress SARS-CoV-2/FLU/RSV testing.  Fact Sheet for Patients: BloggerCourse.com  Fact Sheet for Healthcare Providers: SeriousBroker.it  This test is not yet approved or cleared by the [place]  FDA and has been authorized for detection and/or diagnosis of SARS-CoV-2 by FDA under an Emergency Use Authorization (EUA). This EUA will remain in effect (meaning this test can be used) for the duration of the COVID-19 declaration under Section 564(b)(1) of the Act, 21 U.S.C. section 360bbb-3(b)(1), unless the authorization is terminated or revoked.     Resp Syncytial Virus by PCR NEGATIVE NEGATIVE Final    Comment: (NOTE) Fact Sheet for Patients: BloggerCourse.com  Fact Sheet for Healthcare Providers: SeriousBroker.it  This test is not yet approved or cleared by the [place]  FDA and has been authorized for detection and/or diagnosis of SARS-CoV-2 by FDA under an Emergency Use Authorization (EUA). This EUA will remain in effect (meaning this test can be used) for the duration of the COVID-19 declaration under Section 564(b)(1) of the Act, 21 U.S.C. section 360bbb-3(b)(1), unless the authorization is terminated or revoked.  Performed at [facility], [address] [place], [place] [address]   Blood Culture (routine x 2)     Status: None   Collection Time: 11/23/23  9:20 PM   Specimen: BLOOD  Result Value Ref Range Status   Specimen Description BLOOD BLOOD RIGHT HAND  Final   Special Requests   Final    BOTTLES DRAWN AEROBIC AND ANAEROBIC Blood Culture results may not be optimal due to an inadequate volume of blood received in culture bottles   Culture   Final    NO GROWTH 5 DAYS Performed at [facility], [address], [place], [place] [address]     Report Status 11/28/2023 FINAL  Final         Radiology Studies: No results found.      Scheduled Meds:  apixaban   5 mg Oral BID   aspirin  EC  81 mg Oral Daily   atorvastatin   40 mg Oral Daily   fludrocortisone   0.1 mg Oral BID   FLUoxetine   20 mg Oral Daily   iron  polysaccharides  150 mg Oral Daily   levETIRAcetam   250 mg Oral BID   midodrine   10 mg Oral TID WC   polyethylene glycol  17 g Oral Daily   pyridostigmine   60 mg Oral TID   senna-docusate  1 tablet Oral BID   Continuous Infusions:  sodium chloride  75 mL/hr at 12/01/23 1259     LOS: 0 days     Calvin KATHEE Robson, MD [facility]   If 7PM-7AM, please contact night-coverage  12/01/2023, 3:11 PM

## 2023-12-01 NOTE — Evaluation (Signed)
 Occupational Therapy Evaluation Patient Details Name: Diana Cook MRN: [id number] DOB: 1950/11/05 Today's Date: 12/01/2023   History of Present Illness   73 y.o. female with PMHx of CAD, carotid artery stenosis s/p right carotid stent, [place] complete heart block s/p permanent pacemaker, PAF on anticoagulation, seizure disorder, COPD on chronic 4 L, right breast cancer, and known history of severe refractory orthostatic hypotension who is seen in the emergency room with chief complaint of recurrent syncope.     Clinical Impressions Pt was seen for OT evaluation this date. Prior to hospital admission and since June pt notes increased difficulty. She has been using w/c with transfers only for mobility 2/2 syncope, completing bathing from shower chair in front of the sink, and spouse has been assisting with all meals, cleaning, and driving. Pt presents with deficits in balance, hx of falls, and orthostatic, affecting safe and optimal ADL completion. Pt currently requires PRN MIN A for bathing, dressing, and toileting. Pt edu in falls prevention, facilitated problem solving for optimizing safety with ADL/mobility 2/2 recurrent syncope. Pt would benefit from skilled OT services to address noted impairments and functional limitations (see below for any additional details) in order to maximize safety and independence while minimizing future risk of falls, injury, and readmission. Anticipate pt will benefit from follow up OT services upon acute hospital DC.    If plan is discharge home, recommend the following:   A little help with bathing/dressing/bathroom;A little help with walking and/or transfers;Assistance with cooking/housework;Assist for transportation;Help with stairs or ramp for entrance     Functional Status Assessment   Patient has had a recent decline in their functional status and demonstrates the ability to make significant improvements in function in a reasonable and predictable  amount of time.     Equipment Recommendations   None recommended by OT     Recommendations for Other Services         Precautions/Restrictions   Precautions Precautions: Fall Recall of Precautions/Restrictions: Intact Precaution/Restrictions Comments: orthostatic - careful! Restrictions Weight Bearing Restrictions Per Provider Order: No     Mobility Bed Mobility Overal bed mobility: Modified Independent                  Transfers                   General transfer comment: deferred, NT just got pt back to bed from recliner      Balance                                           ADL either performed or assessed with clinical judgement   ADL Overall ADL's : Needs assistance/impaired                     Lower Body Dressing: Bed level;Supervision/safety Lower Body Dressing Details (indicate cue type and reason): able to doff/don LB garments and placed purewick herself using rolling side to side                     Vision         Perception         Praxis         Pertinent Vitals/Pain Pain Assessment Pain Assessment: No/denies pain     Extremity/Trunk Assessment Upper Extremity Assessment Upper Extremity Assessment: Overall WFL for tasks assessed  Lower Extremity Assessment Lower Extremity Assessment: Defer to PT evaluation       Communication     Cognition Arousal: Alert Behavior During Therapy: [facility] for tasks assessed/performed Cognition: No apparent impairments                                       Cueing  General Comments      Pt on 3L O2, SpO2 87-88%. Bumped to 4L (pt reports baseline 4) and improved to 89-90%   Exercises Other Exercises Other Exercises: Pt edu in falls prevention, facilitated problem solving for optimizing safety with ADL/mobility 2/2 recurrent syncope   Shoulder Instructions      Home Living Family/patient expects to be discharged to::  Private residence Living Arrangements: Spouse/significant other Available Help at Discharge: Family;Available 24 hours/day Type of Home: House Home Access: Level entry     Home Layout: One level     Bathroom Shower/Tub: Producer, television/film/video: Handicapped height     Home Equipment: Rollator (4 wheels);Grab bars - tub/shower;Shower Counsellor (2 wheels);Wheelchair - manual;BSC/3in1;Adaptive equipment;Hand held shower head Adaptive Equipment: Reacher;Long-handled sponge;Long-handled shoe horn;Sock aid Additional Comments: 4L O2 at home      Prior Functioning/Environment Prior Level of Function : Needs assist;History of Falls (last six months)             Mobility Comments: Since June, pt reports progressively worse, pt has been most recently been using w/c self propelling wiht BLE and pivot transfers to/from other surfaces; estimates 2-3 falls in last 33mo from passing out ADLs Comments: mod indep with ADL (seated sink bath), spouse helping with stockings, cooking, cleaning, driving, etc since June, indep wiht med mgt    OT Problem List: Cardiopulmonary status limiting activity;Impaired balance (sitting and/or standing);Decreased safety awareness   OT Treatment/Interventions: Self-care/ADL training;Therapeutic exercise;Therapeutic activities;DME and/or AE instruction;Energy conservation;Patient/family education;Balance training      OT Goals(Current goals can be found in the care plan section)   Acute Rehab OT Goals Patient Stated Goal: go home OT Goal Formulation: With patient Time For Goal Achievement: 12/15/23 Potential to Achieve Goals: Good ADL Goals Pt Will Perform Upper Body Bathing: sitting;with modified independence Pt Will Perform Lower Body Bathing: sitting/lateral leans;with modified independence Pt Will Perform Upper Body Dressing: sitting;with modified independence Pt Will Perform Lower Body Dressing: sitting/lateral leans;with modified  independence Pt Will Transfer to Toilet: squat pivot transfer;bedside commode;with modified independence (LRAD) Pt Will Perform Toileting - Clothing Manipulation and hygiene: with modified independence;sitting/lateral leans   OT Frequency:  Min 2X/week    Co-evaluation              AM-PAC OT 6 Clicks Daily Activity     Outcome Measure Help from another person eating meals?: None Help from another person taking care of personal grooming?: None Help from another person toileting, which includes using toliet, bedpan, or urinal?: A Little Help from another person bathing (including washing, rinsing, drying)?: A Little Help from another person to put on and taking off regular upper body clothing?: None Help from another person to put on and taking off regular lower body clothing?: A Little 6 Click Score: 21   End of Session    Activity Tolerance: Treatment limited secondary to medical complications (Comment) (orthostatics) Patient left: in bed;with call bell/phone within reach;with bed alarm set  OT Visit Diagnosis: Other abnormalities of gait and mobility (R26.89)  Time: 1449-1510 OT Time Calculation (min): 21 min Charges:  OT General Charges $OT Visit: 1 Visit OT Evaluation $OT Eval Low Complexity: 1 Low OT Treatments $Self Care/Home Management : 8-22 mins  Warren SAUNDERS., MPH, MS, OTR/L ascom [phone] 12/01/23, 3:36 PM

## 2023-12-02 DIAGNOSIS — R531 Weakness: Secondary | ICD-10-CM

## 2023-12-02 DIAGNOSIS — I951 Orthostatic hypotension: Secondary | ICD-10-CM | POA: Diagnosis not present

## 2023-12-02 DIAGNOSIS — I25118 Atherosclerotic heart disease of native coronary artery with other forms of angina pectoris: Secondary | ICD-10-CM | POA: Diagnosis not present

## 2023-12-02 MED ORDER — SODIUM CHLORIDE 1 G PO TABS
1.0000 g | ORAL_TABLET | Freq: Two times a day (BID) | ORAL | Status: DC
  Administered 2023-12-02 – 2023-12-07 (×12): 1 g via ORAL
  Filled 2023-12-02 (×12): qty 1

## 2023-12-02 MED ORDER — DROXIDOPA 100 MG PO CAPS
100.0000 mg | ORAL_CAPSULE | Freq: Three times a day (TID) | ORAL | Status: DC
  Administered 2023-12-02 – 2023-12-07 (×15): 100 mg via ORAL
  Filled 2023-12-02 (×18): qty 1

## 2023-12-02 NOTE — Progress Notes (Signed)
 PROGRESS NOTE    Diana Cook  FMW:[id number] DOB: Aug 21, 1950 DOA: 11/30/2023 PCP: Randeen Laine LABOR, MD    Brief Narrative:  Diana Cook is a 73 y.o. female with medical history significant of coronary artery disease, carotid artery stenosis status post right carotid stent, [place] complete heart block status post permanent pacemaker, PAF on anticoagulation, seizure disorder, COPD on chronic 4 L, right breast cancer who is seen in the emergency room with chief complaint of recurrent syncope in the setting of recurrent refractory orthostatic hypotension despite maximum dose of midodrine  and Florinef  TED hose abdominal binder and Mestinon .  She follows closely with cardiology who has been attempting to control her orthostatic symptoms for some time.  Assessment & Plan:   Principal Problem:   Recurrent syncope   Recurrent syncope, orthostatic hypotension -Patient continues to be unsafe for ambulation or discharge home.  Considering discharge to skilled facility.  We also discussed possible need for wheelchair moving forward if patient does not want to transition to skilled facility. -Continue Florinef , midodrine , Mestinon  with abdominal binder and thigh-high TED hose. -PT OT continue to follow, appreciate insight recommendations -Per discussion with cardiology could consider addition of Northera if symptoms continue despite maximum medication regimen as above   Complete heart block status post permanent pacemaker Device interrogated, functioning normally   Hyperkalemia -Likely iatrogenic in the setting of potassium supplementation.  Previously required Kayexalate but now improving, follow repeat labs   CAD -Continue aspirin , statin - currently asymptomatic    PAF Continue Eliquis , V-paced    COPD -with chronic hypoxic respiratory failure, without acute exacerbation At baseline on 4 L nasal cannula, continue to follow clinically   Seizure disorder Continue home Keppra     Depression Prozac    Iron  deficiency anemia Continue home Niferex   DVT prophylaxis: Eliquis  Code Status: DNR limited Family Communication: Spouse at bedside 10/7 Disposition Plan: Status is: Inpatient - likely dispo to SNF given change in status/ambulatory dysfunction/falls Remains inpatient appropriate because: Severe orthostatic hypotension.  Inability to ambulate.  Level of care: Telemetry Medical  Consultants:  Cardiology-[facility]  Procedures:  None  Antimicrobials: None   Subjective: No acute issues or events overnight denies nausea vomit diarrhea constipation fevers chills chest pain.  Continues to have profound orthostatic symptoms upon standing even with staff present   Objective: Vitals:   12/01/23 1533 12/01/23 2012 12/02/23 0436 12/02/23 0854  BP: (!) 187/71 (!) 147/64 118/78 116/70  Pulse: 60 67 79 75  Resp: 16 20 16 18   Temp: 98.1 F (36.7 C) 98.1 F (36.7 C) 97.6 F (36.4 C) 98.2 F (36.8 C)  TempSrc:  Oral Oral Oral  SpO2: 98% 94% 91% 92%  Weight:      Height:        Intake/Output Summary (Last 24 hours) at 12/02/2023 1051 Last data filed at 12/02/2023 0437 Gross per 24 hour  Intake 569.6 ml  Output 1800 ml  Net -1230.4 ml   Filed Weights   11/30/23 0915  Weight: 81.6 kg    Examination:  General:  Pleasantly resting in bed, No acute distress. HEENT:  Normocephalic atraumatic.  Sclerae nonicteric, noninjected.  Extraocular movements intact bilaterally. Neck:  Without mass or deformity.  Trachea is midline. Lungs:  Clear to auscultate bilaterally without rhonchi, wheeze, or rales. Heart:  Regular rate and rhythm.  Without murmurs, rubs, or gallops. Abdomen:  Soft, nontender, nondistended.  Without guarding or rebound. Extremities: Without cyanosis, clubbing, edema, or obvious deformity. Skin:  Warm and dry, no  erythema.  Data Reviewed: I have personally reviewed following labs and imaging studies  CBC: Recent Labs  Lab 11/30/23 0926  12/01/23 0421  WBC 6.5 8.1  HGB 13.6 11.9*  HCT 43.9 37.4  MCV 84.6 82.9  PLT 338 300   Basic Metabolic Panel: Recent Labs  Lab 11/30/23 0926 12/01/23 0421  NA 137 135  K 5.6* 3.5  CL 101 102  CO2 24 24  GLUCOSE 116* 101*  BUN 21 22  CREATININE 1.01* 0.63  CALCIUM  9.8 9.1   GFR: Estimated Creatinine Clearance: 69.4 mL/min (by C-G formula based on SCr of 0.63 mg/dL).  Thyroid  Function Tests: Recent Labs    11/30/23 0926  TSH 1.764    Recent Results (from the past 240 hours)  Blood Culture (routine x 2)     Status: None   Collection Time: 11/23/23  3:10 PM   Specimen: BLOOD  Result Value Ref Range Status   Specimen Description BLOOD BLOOD RIGHT ARM  Final   Special Requests   Final    BOTTLES DRAWN AEROBIC ONLY Blood Culture results may not be optimal due to an inadequate volume of blood received in culture bottles   Culture   Final    NO GROWTH 5 DAYS Performed at [facility], [address] [place], [place] [address]    Report Status 11/28/2023 FINAL  Final  Resp panel by RT-PCR (RSV, Flu A&B, Covid) Anterior Nasal Swab     Status: None   Collection Time: 11/23/23  4:38 PM   Specimen: Anterior Nasal Swab  Result Value Ref Range Status   SARS Coronavirus 2 by RT PCR NEGATIVE NEGATIVE Final    Comment: (NOTE) SARS-CoV-2 target nucleic acids are NOT DETECTED.  The SARS-CoV-2 RNA is generally detectable in upper respiratory specimens during the acute phase of infection. The lowest concentration of SARS-CoV-2 viral copies this assay can detect is 138 copies/mL. A negative result does not preclude SARS-Cov-2 infection and should not be used as the sole basis for treatment or other patient management decisions. A negative result may occur with  improper specimen collection/handling, submission of specimen other than nasopharyngeal swab, presence of viral mutation(s) within the areas targeted by this assay, and inadequate number of viral copies(<138  copies/mL). A negative result must be combined with clinical observations, patient history, and epidemiological information. The expected result is Negative.  Fact Sheet for Patients:  BloggerCourse.com  Fact Sheet for Healthcare Providers:  SeriousBroker.it  This test is no t yet approved or cleared by the [place]  FDA and  has been authorized for detection and/or diagnosis of SARS-CoV-2 by FDA under an Emergency Use Authorization (EUA). This EUA will remain  in effect (meaning this test can be used) for the duration of the COVID-19 declaration under Section 564(b)(1) of the Act, 21 U.S.C.section 360bbb-3(b)(1), unless the authorization is terminated  or revoked sooner.       Influenza A by PCR NEGATIVE NEGATIVE Final   Influenza B by PCR NEGATIVE NEGATIVE Final    Comment: (NOTE) The Xpert Xpress SARS-CoV-2/FLU/RSV plus assay is intended as an aid in the diagnosis of influenza from Nasopharyngeal swab specimens and should not be used as a sole basis for treatment. Nasal washings and aspirates are unacceptable for Xpert Xpress SARS-CoV-2/FLU/RSV testing.  Fact Sheet for Patients: BloggerCourse.com  Fact Sheet for Healthcare Providers: SeriousBroker.it  This test is not yet approved or cleared by the [place]  FDA and has been authorized for detection and/or diagnosis of SARS-CoV-2  by FDA under an Emergency Use Authorization (EUA). This EUA will remain in effect (meaning this test can be used) for the duration of the COVID-19 declaration under Section 564(b)(1) of the Act, 21 U.S.C. section 360bbb-3(b)(1), unless the authorization is terminated or revoked.     Resp Syncytial Virus by PCR NEGATIVE NEGATIVE Final    Comment: (NOTE) Fact Sheet for Patients: BloggerCourse.com  Fact Sheet for Healthcare  Providers: SeriousBroker.it  This test is not yet approved or cleared by the [place]  FDA and has been authorized for detection and/or diagnosis of SARS-CoV-2 by FDA under an Emergency Use Authorization (EUA). This EUA will remain in effect (meaning this test can be used) for the duration of the COVID-19 declaration under Section 564(b)(1) of the Act, 21 U.S.C. section 360bbb-3(b)(1), unless the authorization is terminated or revoked.  Performed at [facility], [address] [place], [place] [address]   Blood Culture (routine x 2)     Status: None   Collection Time: 11/23/23  9:20 PM   Specimen: BLOOD  Result Value Ref Range Status   Specimen Description BLOOD BLOOD RIGHT HAND  Final   Special Requests   Final    BOTTLES DRAWN AEROBIC AND ANAEROBIC Blood Culture results may not be optimal due to an inadequate volume of blood received in culture bottles   Culture   Final    NO GROWTH 5 DAYS Performed at [facility], [address] [place], [place] [address]    Report Status 11/28/2023 FINAL  Final         Radiology Studies: No results found.      Scheduled Meds:  apixaban   5 mg Oral BID   aspirin  EC  81 mg Oral Daily   atorvastatin   40 mg Oral Daily   fludrocortisone   0.1 mg Oral BID   FLUoxetine   20 mg Oral Daily   iron  polysaccharides  150 mg Oral Daily   levETIRAcetam   250 mg Oral BID   midodrine   10 mg Oral TID WC   polyethylene glycol  17 g Oral Daily   pyridostigmine   60 mg Oral TID   senna-docusate  1 tablet Oral BID   Continuous Infusions:  sodium chloride  75 mL/hr at 12/02/23 0036     LOS: 1 day   Time spent: 55 minutes  Diana JAYSON Montclair, DO [facility]   If 7PM-7AM, please contact night-coverage  12/02/2023, 10:51 AM

## 2023-12-02 NOTE — TOC Initial Note (Signed)
 Transition of Care [facility]) - Initial/Assessment Note    Patient Details  Name: Diana Cook MRN: [id number] Date of Birth: 25-May-1950  Transition of Care [facility]) CM/SW Contact:    [name], RN Phone Number: 12/02/2023, 4:45 PM  Clinical Narrative:                  Admitted for: syncope Admitted from: home with husband  PCP: [facility] Current home health/prior home health/DME:  Rollator (4 wheels);Grab bars - tub/shower;Shower Counsellor (2 wheels);Wheelchair - manual;BSC/3in1;Adaptive equipment;Hand held shower head Additional Comments: 4L O2 at home [facility]  Therapy currently recommending HH.  Met with patient at Bedside to discuss.  Patient currently active with [facility].   Recs for DABSC and WC with removable arms.  CM to reach out to DME agency to determine when patient received the [facility] and WC she currently has in the home       Patient Goals and CMS Choice            Expected Discharge Plan and Services                                              Prior Living Arrangements/Services                       Activities of Daily Living   ADL Screening (condition at time of admission) Independently performs ADLs?: No Does the patient have a NEW difficulty with bathing/dressing/toileting/self-feeding that is expected to last >3 days?: Yes (Initiates electronic notice to provider for possible OT consult) Does the patient have a NEW difficulty with getting in/out of bed, walking, or climbing stairs that is expected to last >3 days?: Yes (Initiates electronic notice to provider for possible PT consult) Does the patient have a NEW difficulty with communication that is expected to last >3 days?: No Is the patient deaf or have difficulty hearing?: No Does the patient have difficulty seeing, even when wearing glasses/contacts?: No Does the patient have difficulty concentrating, remembering, or making decisions?:  No  Permission Sought/Granted                  Emotional Assessment              Admission diagnosis:  Syncope and collapse [R55] Hyperkalemia [E87.5] Recurrent syncope [R55] Patient Active Problem List   Diagnosis Date Noted   Recurrent syncope 11/30/2023   Personal history of fall 11/27/2023   Nausea & vomiting 11/24/2023   Community acquired pneumonia 11/24/2023   Iron  deficiency 11/02/2023   Syncope and collapse 09/26/2023   Hypokalemia 09/26/2023   Carotid stenosis s/p stents 09/26/2023   Chronic anticoagulation 09/26/2023   Physical deconditioning 09/18/2023   Syncope 08/29/2023   Orthostasis 08/28/2023   Decreased GFR 04/29/2023   Presence of heart assist device (HCC) 03/25/2023   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/25/2023   Persistent cough 03/03/2023   Urinary urgency 02/09/2023   Carotid stenosis, symptomatic w/o infarct 03/23/2022   Right shoulder pain 11/25/2021   Anemia 11/03/2021   Thyroid  nodule 09/17/2021   Dyslipidemia 09/05/2021   Depression 09/05/2021   GERD without esophagitis 09/05/2021   Vitamin D  deficiency 08/20/2021   Colon cancer screening 07/30/2021   Hypercalcemia 07/29/2021   UTI (urinary tract infection) 07/17/2021   Weakness 07/08/2021  Dysuria 07/08/2021   Arterial hypotension 07/08/2021   Paroxysmal atrial fibrillation (HCC) 04/11/2021   Current use of proton pump inhibitor 10/24/2020   Sleep apnea 10/24/2020   Fatigue 10/24/2020   Lightheadedness 05/16/2020   Radicular pain in left arm 03/02/2020   Neck pain on left side 02/15/2020   Paresthesia 02/15/2020   Chronic hypoxic respiratory failure (HCC) 11/17/2019   Chronic heart failure with preserved ejection fraction (HFpEF) (HCC) 08/11/2019   Coronary artery disease of native artery of native heart with stable angina pectoris 06/20/2019   Gastroesophageal reflux disease    Stomach irritation    Abnormal CT scan, esophagus    Polyp of ascending colon     Personal history of tobacco use, presenting hazards to health 01/18/2018   Grade I diastolic dysfunction 12/21/2017   Globus pharyngeus 12/21/2017   Chronic obstructive pulmonary disease (HCC) 12/15/2017   Hyperlipidemia 12/07/2017   Seizures (HCC) 10/01/2016   Grief reaction 04/11/2016   History of breast cancer 12/11/2015   Osteoporosis 11/22/2015   Estrogen deficiency 09/07/2015   Wrinkles 09/07/2015   Post herpetic neuralgia 09/01/2014   Encounter for Medicare annual wellness exam 07/11/2014   Routine general medical examination at a health care facility 06/26/2013   H/O small bowel obstruction 04/15/2013   SOB (shortness of breath) 02/10/2013   Complete heart block s/p pacemaker (HCC) 11/03/2011   Pacemaker-[employer] 11/03/2011   Lumbar spinal stenosis 09/02/2011   POSTMENOPAUSAL STATUS 08/06/2009   HIDRADENITIS SUPPURATIVA 06/26/2008   HERNIATED DISC 12/14/2007   Hyperkalemia 08/06/2007   History of colonic polyps 09/14/2006   Former smoker 06/10/2006   Asthma 06/10/2006   H/O idiopathic seizure 06/10/2006   PCP:  Randeen Laine LABOR, MD Pharmacy:   [facility] [address] [place] - [address] [place] [address] Phone: [phone] Fax: [phone]  [facility] [place], [place] - [address] [place] [address] Phone: [phone] Fax: [phone]  [facility] [address] [place] [address] Phone: [phone] Fax: [phone]     Social Drivers of Health (SDOH) Social History: SDOH Screenings   Food Insecurity: No Food Insecurity (11/30/2023)  Housing: Low Risk  (11/30/2023)  Transportation Needs: No Transportation Needs (11/30/2023)  Utilities: Not At Risk (11/30/2023)  Alcohol Screen: Low Risk  (05/13/2023)  Depression (PHQ2-9): Low Risk  (05/08/2023)  Recent Concern: Depression (PHQ2-9) - Medium Risk (03/25/2023)   Financial Resource Strain: Low Risk  (05/13/2023)  Physical Activity: Inactive (05/13/2023)  Social Connections: Socially Integrated (11/30/2023)  Stress: No Stress Concern Present (05/13/2023)  Tobacco Use: Medium Risk (11/30/2023)  Health Literacy: Adequate Health Literacy (03/06/2023)   SDOH Interventions:     Readmission Risk Interventions     No data to display

## 2023-12-02 NOTE — Progress Notes (Signed)
 Physical Therapy Treatment Patient Details Name: Diana Cook MRN: [id number] DOB: [birth year] Today's Date: 12/02/2023   History of Present Illness 73 y.o. female with PMHx of CAD, carotid artery stenosis s/p right carotid stent, [place] complete heart block s/p permanent pacemaker, PAF on anticoagulation, seizure disorder, COPD on chronic 4 L, right breast cancer, and known history of severe refractory orthostatic hypotension who is seen in the emergency room with chief complaint of recurrent syncope.    PT Comments  Pt was seated EOB upon arrival.  I just sat up right before you came in. I've been sitting here for ~ 2 minutes. BP while seated EOB 106/63(75). Pt is well known by author from previous admission. She remains extremely motivated and pleasant but remains very orthostatic hypotensive. Pt stood to RW and remained standing for ~ 30 sec prior to having pre-syncopal event and needing to urgently be returned to supine. BP 50/37 (42) in standing. Pt's mental status changed but quickly resolved once BLEs were elevated. BP elevated to 125/68 (83) in supine. Pt very motivated and eager to sit OOB in recliner. On 2nd attempt. BP dropped to 87/54 (63) however only minimal standing time due to concerns of syncopal. Pt's BP once reclined in recliner recovers to 112/61(77). Acute PT will continue to follow. Due to pt's ongoing hypotensive response to change in positions, w/c recommended + minimal standing due to high risk of syncopal episodes.     If plan is discharge home, recommend the following: A little help with walking and/or transfers;A little help with bathing/dressing/bathroom;Assist for transportation;Help with stairs or ramp for entrance     Equipment Recommendations  Wheelchair (measurements PT);Wheelchair cushion (measurements PT);BSC/3in1 (drop arm bed side commode)       Precautions / Restrictions Precautions Precautions: Fall Recall of Precautions/Restrictions:  Intact Precaution/Restrictions Comments: orthostatic - careful! Restrictions Weight Bearing Restrictions Per Provider Order: No     Mobility  Bed Mobility  General bed mobility comments: pt was seated EOB upon arrival. I just sat up like 2 minutes ago. BP while EOB short sitting 106/63(75)    Transfers Overall transfer level: Needs assistance Equipment used: Rolling walker (2 wheels) Transfers: Sit to/from Stand, Bed to chair/wheelchair/BSC Sit to Stand: Min assist Step pivot transfers: Min assist  General transfer comment: pt stood EOB 1 x with BP dropping to 50/37 (42). cognition was poor in standing however quickly returns to baseline once retruned to supine with BLEs elevated. BP once back in bed 125/68(83) pt stayed in bed for ~ 2 minutes prior to getting back OOB to recliner . BP dropped to 87/54(63) on 2nd attempt. quickly elevated BLEs (iun recliner) with BP resolvoing to 112/61(77)    Ambulation/Gait Ambulation/Gait assistance: Contact guard assist Gait Distance (Feet): 3 Feet Assistive device: Rolling walker (2 wheels) Gait Pattern/deviations: Step-to pattern  General Gait Details: did take a few steps to recliner however pt continues to have severe orthostatic hypotensive responses to change in position    Balance Overall balance assessment: Needs assistance Sitting-balance support: No upper extremity supported, Feet supported Sitting balance-Leahy Scale: Good     Standing balance support: Bilateral upper extremity supported, During functional activity, Reliant on assistive device for balance Standing balance-Leahy Scale: Poor Standing balance comment: remains high fall risk due to hypotensive response to change in positions       Communication Communication Communication: No apparent difficulties  Cognition Arousal: Alert Behavior During Therapy: WFL for tasks assessed/performed   PT - Cognitive impairments: No apparent impairments  PT - Cognition  Comments: pt remains A and O x 4. extremely pleasant and motivated however remains orthostatic hypotensive Following commands: Intact      Cueing Cueing Techniques: Verbal cues         Pertinent Vitals/Pain Pain Assessment Pain Assessment: No/denies pain     PT Goals (current goals can now be found in the care plan section) Acute Rehab PT Goals Patient Stated Goal: to return home Progress towards PT goals: Not progressing toward goals - comment (limited by orthostatc hypotension)    Frequency    Min 2X/week       AM-PAC PT 6 Clicks Mobility   Outcome Measure  Help needed turning from your back to your side while in a flat bed without using bedrails?: None Help needed moving from lying on your back to sitting on the side of a flat bed without using bedrails?: None Help needed moving to and from a bed to a chair (including a wheelchair)?: None Help needed standing up from a chair using your arms (e.g., wheelchair or bedside chair)?: A Little Help needed to walk in hospital room?: A Lot Help needed climbing 3-5 steps with a railing? : A Lot 6 Click Score: 19    End of Session Equipment Utilized During Treatment: Oxygen  (4L at baseline) Activity Tolerance: Treatment limited secondary to medical complications (Comment) (limited by orthotstatic hypoytension) Patient left: in chair;with call bell/phone within reach;with chair alarm set Nurse Communication: Mobility status PT Visit Diagnosis: Muscle weakness (generalized) (M62.81)     Time: 8693-8659 PT Time Calculation (min) (ACUTE ONLY): 34 min  Charges:    $Therapeutic Activity: 23-37 mins PT General Charges $$ ACUTE PT VISIT: 1 Visit                     Rankin Essex PTA 12/02/23, 2:03 PM

## 2023-12-02 NOTE — Plan of Care (Signed)
  Problem: Clinical Measurements: Goal: Ability to maintain clinical measurements within normal limits will improve Outcome: Progressing Goal: Will remain free from infection Outcome: Progressing Goal: Diagnostic test results will improve Outcome: Progressing Goal: Respiratory complications will improve Outcome: Progressing Goal: Cardiovascular complication will be avoided Outcome: Progressing   Problem: Health Behavior/Discharge Planning: Goal: Ability to manage health-related needs will improve Outcome: Progressing   Problem: Education: Goal: Knowledge of General Education information will improve Description: Including pain rating scale, medication(s)/side effects and non-pharmacologic comfort measures Outcome: Progressing   Problem: Activity: Goal: Risk for activity intolerance will decrease Outcome: Progressing

## 2023-12-02 NOTE — Progress Notes (Signed)
 Rounding Note   Patient Name: Diana Cook Date of Encounter: 12/02/2023  [facility] Cardiologist: Lonni Hanson, MD   Subjective Patient seen during a.m. rounds and was lying in bed comfortably.  Reports that when orthostatic vitals were being taken yesterday, after switching from lying down to sitting up, they almost syncopized.  Patient has still been unable to sit up or stand due to symptoms.  Scheduled Meds:  apixaban   5 mg Oral BID   aspirin  EC  81 mg Oral Daily   atorvastatin   40 mg Oral Daily   fludrocortisone   0.1 mg Oral BID   FLUoxetine   20 mg Oral Daily   iron  polysaccharides  150 mg Oral Daily   levETIRAcetam   250 mg Oral BID   midodrine   10 mg Oral TID WC   polyethylene glycol  17 g Oral Daily   pyridostigmine   60 mg Oral TID   senna-docusate  1 tablet Oral BID   sodium chloride   1 g Oral BID WC   Continuous Infusions:  sodium chloride  75 mL/hr at 12/02/23 0036   PRN Meds: acetaminophen  **OR** acetaminophen , benzonatate , bisacodyl , ipratropium-albuterol , ondansetron  **OR** ondansetron  (ZOFRAN ) IV, oxyCODONE , zolpidem    Vital Signs  Vitals:   12/01/23 2012 12/02/23 0436 12/02/23 0854 12/02/23 1158  BP: (!) 147/64 118/78 116/70 (!) 146/73  Pulse: 67 79 75 69  Resp: 20 16 18 20   Temp: 98.1 F (36.7 C) 97.6 F (36.4 C) 98.2 F (36.8 C)   TempSrc: Oral Oral Oral   SpO2: 94% 91% 92% 94%  Weight:      Height:        Intake/Output Summary (Last 24 hours) at 12/02/2023 1220 Last data filed at 12/02/2023 0437 Gross per 24 hour  Intake 569.6 ml  Output 1800 ml  Net -1230.4 ml      11/30/2023    9:15 AM 11/24/2023    5:35 AM 11/23/2023    9:15 AM  Last 3 Weights  Weight (lbs) 180 lb 186 lb 11.7 oz 189 lb 9.5 oz  Weight (kg) 81.647 kg 84.7 kg 86 kg      Telemetry A sensed V paced with a rate in 70s to 80s- Personally Reviewed  ECG  No new tracings- Personally Reviewed  Physical Exam Assessment performed by MD  Labs High  Sensitivity Troponin:   Recent Labs  Lab 11/18/23 1801 11/23/23 0926 11/23/23 1220 11/30/23 0926 11/30/23 1231  TROPONINIHS 22* 68* 60* 28* 24*     Chemistry Recent Labs  Lab 11/30/23 0926 12/01/23 0421  NA 137 135  K 5.6* 3.5  CL 101 102  CO2 24 24  GLUCOSE 116* 101*  BUN 21 22  CREATININE 1.01* 0.63  CALCIUM  9.8 9.1  GFRNONAA 58* >60  ANIONGAP 12 9    Lipids No results for input(s): CHOL, TRIG, HDL, LABVLDL, LDLCALC, CHOLHDL in the last 168 hours.  Hematology Recent Labs  Lab 11/30/23 0926 12/01/23 0421  WBC 6.5 8.1  RBC 5.19* 4.51  HGB 13.6 11.9*  HCT 43.9 37.4  MCV 84.6 82.9  MCH 26.2 26.4  MCHC 31.0 31.8  RDW 15.2 15.4  PLT 338 300   Thyroid   Recent Labs  Lab 11/30/23 0926  TSH 1.764    BNPNo results for input(s): BNP, PROBNP in the last 168 hours.  DDimer No results for input(s): DDIMER in the last 168 hours.   Radiology  No results found.  Cardiac Studies Echo 07/2023 1. Left ventricular ejection fraction, by estimation, is  55 to 60%. Left  ventricular ejection fraction by 2D MOD biplane is 56.1 %. The left  ventricle has normal function. The left ventricle has no regional wall  motion abnormalities. Left ventricular  diastolic parameters are consistent with Grade I diastolic dysfunction  (impaired relaxation).   2. Right ventricular systolic function is normal. The right ventricular  size is normal.   3. The mitral valve is normal in structure. Trivial mitral valve  regurgitation.   4. The aortic valve is tricuspid. Aortic valve regurgitation is not  visualized.    Carotid US  05/2023 Vertebrals:  Bilateral vertebral arteries demonstrate antegrade flow.  Subclavians: Normal flow hemodynamics were seen in bilateral subclavian arteries.    2d echo 07/16/21   1. Left ventricular ejection fraction, by estimation, is 55 to 60%. The  left ventricle has normal function. Septal wall motion abnormality  consistent with bundle  branch block.   2. There is mild left ventricular hypertrophy. Left ventricular diastolic  parameters are consistent with Grade I diastolic dysfunction (impaired  relaxation).   3. Right ventricular systolic function is normal. The right ventricular  size is normal. There is normal pulmonary artery systolic pressure. The  estimated right ventricular systolic pressure is 31.6 mmHg.   4. The mitral valve is normal in structure. No evidence of mitral valve  regurgitation. No evidence of mitral stenosis.   5. The aortic valve was not well visualized. Aortic valve regurgitation  is not visualized. No aortic stenosis is present.   6. The inferior vena cava is normal in size with greater than 50%  respiratory variability, suggesting right atrial pressure of 3 mmHg.     Patient Profile   73 y.o. female  with a hx of moderate coronary artery disease by cardiac CTA (11/2019) and cardiac cath (5/21), carotid artery stenosis status post right carotid stenting (04/2022), complete heart block status post permanent pacemaker followed by EP (last gen change 2021), paroxysmal atrial fibrillation, seizure disorder, COPD on chronic 4 L of O2, right breast cancer, who is being seen for the evaluation of syncope at the request of Dr Jhonny .   Assessment & Plan  Recurrent syncope with orthostatic hypotension - Multiple hospitalizations for syncope and orthostatic hypotension -Previously was to be on pyridostigmine  60 mg tid (stating she never started the medication), midodrine  10 mg 3 times daily and Florinef  0.1 mg twice daily -Patient admitted to not being on the currently prescribed medication regimen, not wearing her abdominal binder, and not maintaining adequate hydration -Restarted on PTA medications by ED provider - Continue to encourage TED hose, abdominal binder, adequate hydration -Increased sodium intake in the morning would likely be beneficial - Recently evaluated in the [facility] emergency department  and was advised there is not a lot left to be done for her recurrent syncope and orthostatic hypotension - Pacemaker interrogated and functioning normally - Continue to check orthostatic vital signs -Continue current regimen of fludrocortisone  0.1 mg twice daily, midodrine  10 mg 3 times daily with meals, and pyridostigmine  60 mg p.o. 3 times daily.  - If continues to have severe symptomatic orthostatic hypotension, consider addition of Northera - Start sodium chloride  tablets 1 g daily - Recommend moving from bed to chair when possible to avoid muscular atrophy of legs    Hyperkalemia -Serum potassium 3.5 on 10/7 -Given Kayexalate in the emergency department on arrival for serum potassium 5.6 -Daily BMP -PTA potassium supplements placed on hold -Monitor/trend/replete electrolytes as needed   Coronary artery disease -Prior cardiac  CTA and subsequent catheterization showed moderate nonobstructive coronary artery disease -She continues to remain chest pain-free -EKG without ischemic changes -Continued on aspirin  and apixaban  -Continued on statin therapy -Continue on telemetry monitoring -EKG as needed for pain or changes   Carotid artery stenosis -Status post right carotid stent -Recent ultrasound revealed a normal blood flow -Ongoing outpatient management per VVS -Continued on aspirin  and apixaban    Paroxysmal atrial fibrillation - Continued on apixaban  5 mg twice daily for CHA2DS2-VASc score of at least 5 for stroke prophylaxis -Currently a sensed and V paced on telemetry monitoring   Complete heart block status post permanent pacemaker - Normally functioning device - Continue with remote uploads and outpatient follow-up with EP   7.  Seizure history - Reports onset of epilepsy in their 30s; on Keppra  PTA and reports not having any issues for decades - In the past patient never remembered the seizures, just waking up on the floor and feeling clammy which they liken to current  episodes of syncope.   - Does not get dizzy or lightheaded prior to current episodes of syncope but does have documented orthostatic hypotension. - Recommend neurology consult to evaluate further     For questions or updates, please contact [facility] Please consult [email or web address] for contact info under       Signed, Aishia Barkey, NP  12/02/2023, 12:20 PM

## 2023-12-03 ENCOUNTER — Inpatient Hospital Stay

## 2023-12-03 ENCOUNTER — Encounter: Payer: Self-pay | Admitting: Internal Medicine

## 2023-12-03 DIAGNOSIS — I959 Hypotension, unspecified: Secondary | ICD-10-CM | POA: Insufficient documentation

## 2023-12-03 DIAGNOSIS — R531 Weakness: Secondary | ICD-10-CM | POA: Diagnosis not present

## 2023-12-03 DIAGNOSIS — I951 Orthostatic hypotension: Secondary | ICD-10-CM | POA: Diagnosis not present

## 2023-12-03 DIAGNOSIS — R55 Syncope and collapse: Secondary | ICD-10-CM | POA: Diagnosis not present

## 2023-12-03 DIAGNOSIS — R634 Abnormal weight loss: Secondary | ICD-10-CM | POA: Insufficient documentation

## 2023-12-03 DIAGNOSIS — I25118 Atherosclerotic heart disease of native coronary artery with other forms of angina pectoris: Secondary | ICD-10-CM | POA: Diagnosis not present

## 2023-12-03 LAB — BASIC METABOLIC PANEL WITH GFR
Anion gap: 9 (ref 5–15)
BUN: 13 mg/dL (ref 8–23)
CO2: 26 mmol/L (ref 22–32)
Calcium: 9 mg/dL (ref 8.9–10.3)
Chloride: 101 mmol/L (ref 98–111)
Creatinine, Ser: 0.76 mg/dL (ref 0.44–1.00)
GFR, Estimated: >60 mL/min (ref >60)
Glucose, Bld: 99 mg/dL (ref 70–99)
Potassium: 3.5 mmol/L (ref 3.5–5.1)
Sodium: 136 mmol/L (ref 135–145)

## 2023-12-03 LAB — CBC
HCT: 40.2 % (ref 36.0–46.0)
Hemoglobin: 12.9 g/dL (ref 12.0–15.0)
MCH: 26.7 pg (ref 26.0–34.0)
MCHC: 32.1 g/dL (ref 30.0–36.0)
MCV: 83.2 fL (ref 80.0–100.0)
Platelets: 289 K/uL (ref 150–400)
RBC: 4.83 MIL/uL (ref 3.87–5.11)
RDW: 15.6 % — ABNORMAL HIGH (ref 11.5–15.5)
WBC: 10.7 K/uL — ABNORMAL HIGH (ref 4.0–10.5)
nRBC: 0 % (ref 0.0–0.2)

## 2023-12-03 MED ORDER — GADOBUTROL 1 MMOL/ML IV SOLN
8.0000 mL | Freq: Once | INTRAVENOUS | Status: AC | PRN
  Administered 2023-12-03: 8 mL via INTRAVENOUS

## 2023-12-03 NOTE — Assessment & Plan Note (Addendum)
 With new onset headaches and patient endorses weight loss Workup in progress including neoplasm MRI of the brain with and without contrast ordered Fall precaution, PT, OT  12/04/2023: Overnight it was reported that patient had a syncopal event.  At bedside, patient is awake alert and oriented x 3.  Patient reports that she was trying to get to the bedside commode when she suddenly felt dizzy and the next thing she knew she was on the floor.  She did not remember having control of falling or turning back to the bed so that she could prevent a fall.  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 Neurology was consulted who states the syncopal events are unlikely to be glioma related. Appreciate neurology recommendation.  Seizure precaution in place.  Patient and her spouse Diana Cook was updated over the phone via speaker well at bedside with the patient.  12/07/2023: No syncopal event in the last 48 hours.  I am hopeful that with continuation of physical therapy, abdominal binder, and salt ups, compression stocks and, midodrine , Florinef , droxidopa that she will have mild/some improvement.

## 2023-12-03 NOTE — Progress Notes (Signed)
 PROGRESS NOTE    Diana Cook  FMW:[id number] DOB: [birth year] DOA: 11/30/2023 PCP: Randeen Laine LABOR, MD   No notes on file  Brief Narrative:  She is able to tell me her name, age, current year 2025. She states she is at [facility].  She reports weight loss, but does not know the amount. She reports new headache that started in June, worsening recently.   Assessment & Plan:   Principal Problem:   Recurrent syncope Active Problems:   Orthostasis   Complete heart block s/p pacemaker (HCC)   Seizures (HCC)   Hyperlipidemia   Coronary artery disease of native artery of native heart with stable angina pectoris   Weakness   Weight loss   Hypotension   Assessment and Plan:  * Recurrent syncope With new onset headaches and patient endorses weight loss Workup in progress including neoplasm MRI of the brain with and without contrast ordered Fall precaution, PT, OT  Seizures (HCC) Resume home Keppra  250 mg p.o. twice daily  Hyperlipidemia Home atorvastatin  40 mg daily  Hypotension Continue with home midodrine  10 mg 3 times daily with meals, fludrocortisone  0.1 mg p.o. twice daily, pyridostigmine  60 mg 3 times daily  DVT prophylaxis: Apixaban  Code Status:  Family Communication: Disposition Plan:  Level of care: Telemetry Medical  Consultants:  PT, OT, TOC  Procedures:  None  Antimicrobials: Not indicated at this time  Subjective:  Patient reports recurrent weakness and hypotension.  She reports dizziness especially when she goes into a standing position and worsening since June 2025.  She endorses a mild headache that started since June.  She also endorses weight loss however is not able to tell me how much weight she has lost.  Objective: Vitals:   12/03/23 0757 12/03/23 0947 12/03/23 0951 12/03/23 0953  BP: (!) 122/57 (!) 146/72 122/60 (!) 71/56  Pulse: 66 69 72 (!) 145  Resp: 18     Temp: 98 F (36.7 C)     TempSrc:      SpO2: 97%      Weight:      Height:        Intake/Output Summary (Last 24 hours) at 12/03/2023 1415 Last data filed at 12/03/2023 1300 Gross per 24 hour  Intake 2027.37 ml  Output 1100 ml  Net 927.37 ml   Filed Weights   11/30/23 0915  Weight: 81.6 kg   Examination:  General exam: Appears calm and comfortable  Respiratory system: Clear to auscultation. Respiratory effort normal. Cardiovascular system: S1 & S2 heard, RRR. No JVD, murmurs, rubs, gallops or clicks. No pedal edema. Gastrointestinal system: Abdomen is nondistended, soft and nontender. No organomegaly or masses felt. Normal bowel sounds heard. Central nervous system: Alert and oriented. No focal neurological deficits. Extremities: Symmetric 5 x 5 power. Skin: No rashes, lesions or ulcers Psychiatry: Judgement and insight appear normal. Mood & affect appropriate.   Data Reviewed: I have personally reviewed following labs and imaging studies  CBC: Recent Labs  Lab 11/30/23 0926 12/01/23 0421 12/03/23 1243  WBC 6.5 8.1 10.7*  HGB 13.6 11.9* 12.9  HCT 43.9 37.4 40.2  MCV 84.6 82.9 83.2  PLT 338 300 289   Basic Metabolic Panel: Recent Labs  Lab 11/30/23 0926 12/01/23 0421 12/03/23 1243  NA 137 135 136  K 5.6* 3.5 3.5  CL 101 102 101  CO2 24 24 26   GLUCOSE 116* 101* 99  BUN 21 22 13   CREATININE 1.01* 0.63 0.76  CALCIUM  9.8 9.1 9.0  GFR: Estimated Creatinine Clearance: 69.4 mL/min (by C-G formula based on SCr of 0.76 mg/dL).  Recent Results (from the past 240 hours)  Blood Culture (routine x 2)     Status: None   Collection Time: 11/23/23  3:10 PM   Specimen: BLOOD  Result Value Ref Range Status   Specimen Description BLOOD BLOOD RIGHT ARM  Final   Special Requests   Final    BOTTLES DRAWN AEROBIC ONLY Blood Culture results may not be optimal due to an inadequate volume of blood received in culture bottles   Culture   Final    NO GROWTH 5 DAYS Performed at [facility], [address]  [place], [place] [address]    Report Status 11/28/2023 FINAL  Final  Resp panel by RT-PCR (RSV, Flu A&B, Covid) Anterior Nasal Swab     Status: None   Collection Time: 11/23/23  4:38 PM   Specimen: Anterior Nasal Swab  Result Value Ref Range Status   SARS Coronavirus 2 by RT PCR NEGATIVE NEGATIVE Final    Comment: (NOTE) SARS-CoV-2 target nucleic acids are NOT DETECTED.  The SARS-CoV-2 RNA is generally detectable in upper respiratory specimens during the acute phase of infection. The lowest concentration of SARS-CoV-2 viral copies this assay can detect is 138 copies/mL. A negative result does not preclude SARS-Cov-2 infection and should not be used as the sole basis for treatment or other patient management decisions. A negative result may occur with  improper specimen collection/handling, submission of specimen other than nasopharyngeal swab, presence of viral mutation(s) within the areas targeted by this assay, and inadequate number of viral copies(<138 copies/mL). A negative result must be combined with clinical observations, patient history, and epidemiological information. The expected result is Negative.  Fact Sheet for Patients:  BloggerCourse.com  Fact Sheet for Healthcare Providers:  SeriousBroker.it  This test is no t yet approved or cleared by the [place]  FDA and  has been authorized for detection and/or diagnosis of SARS-CoV-2 by FDA under an Emergency Use Authorization (EUA). This EUA will remain  in effect (meaning this test can be used) for the duration of the COVID-19 declaration under Section 564(b)(1) of the Act, 21 U.S.C.section 360bbb-3(b)(1), unless the authorization is terminated  or revoked sooner.       Influenza A by PCR NEGATIVE NEGATIVE Final   Influenza B by PCR NEGATIVE NEGATIVE Final    Comment: (NOTE) The Xpert Xpress SARS-CoV-2/FLU/RSV plus assay is intended as an aid in the diagnosis of  influenza from Nasopharyngeal swab specimens and should not be used as a sole basis for treatment. Nasal washings and aspirates are unacceptable for Xpert Xpress SARS-CoV-2/FLU/RSV testing.  Fact Sheet for Patients: BloggerCourse.com  Fact Sheet for Healthcare Providers: SeriousBroker.it  This test is not yet approved or cleared by the [place]  FDA and has been authorized for detection and/or diagnosis of SARS-CoV-2 by FDA under an Emergency Use Authorization (EUA). This EUA will remain in effect (meaning this test can be used) for the duration of the COVID-19 declaration under Section 564(b)(1) of the Act, 21 U.S.C. section 360bbb-3(b)(1), unless the authorization is terminated or revoked.     Resp Syncytial Virus by PCR NEGATIVE NEGATIVE Final    Comment: (NOTE) Fact Sheet for Patients: BloggerCourse.com  Fact Sheet for Healthcare Providers: SeriousBroker.it  This test is not yet approved or cleared by the [place]  FDA and has been authorized for detection and/or diagnosis of SARS-CoV-2 by FDA under an Emergency Use Authorization (EUA). This  EUA will remain in effect (meaning this test can be used) for the duration of the COVID-19 declaration under Section 564(b)(1) of the Act, 21 U.S.C. section 360bbb-3(b)(1), unless the authorization is terminated or revoked.  Performed at [facility], [address] [place], [place] [address]   Blood Culture (routine x 2)     Status: None   Collection Time: 11/23/23  9:20 PM   Specimen: BLOOD  Result Value Ref Range Status   Specimen Description BLOOD BLOOD RIGHT HAND  Final   Special Requests   Final    BOTTLES DRAWN AEROBIC AND ANAEROBIC Blood Culture results may not be optimal due to an inadequate volume of blood received in culture bottles   Culture   Final    NO GROWTH 5 DAYS Performed at [facility], [address] [place], [place] [address]    Report Status 11/28/2023 FINAL  Final    Radiology Studies: No results found.  Scheduled Meds:  apixaban   5 mg Oral BID   aspirin  EC  81 mg Oral Daily   atorvastatin   40 mg Oral Daily   droxidopa  100 mg Oral TID WC   fludrocortisone   0.1 mg Oral BID   FLUoxetine   20 mg Oral Daily   iron  polysaccharides  150 mg Oral Daily   levETIRAcetam   250 mg Oral BID   midodrine   10 mg Oral TID WC   polyethylene glycol  17 g Oral Daily   pyridostigmine   60 mg Oral TID   senna-docusate  1 tablet Oral BID   sodium chloride   1 g Oral BID WC   Continuous Infusions: none   LOS: 2 days   Time spent: 45  Dr. Sherre [facility]  If 7PM-7AM, please contact night-coverage  12/03/2023, 2:15 PM

## 2023-12-03 NOTE — TOC Progression Note (Signed)
 Transition of Care [facility]) - Progression Note    Patient Details  Name: Diana Cook MRN: [id number] Date of Birth: March 11, 1950  Transition of [facility]) CM/SW Contact  [name], RN Phone Number: 12/03/2023, 3:31 PM  Clinical Narrative:      Per Thomasina with [facility] patient received BSc in 7/23, and would not be eligible for another [facility] through insurance  Patient had an order for a BSC 2 months ago, however order was placed on hold by patient.  CM to follow up with patient to determine if she would like order processed for a WC with removable arm rest                     Expected Discharge Plan and Services                                               Social Drivers of Health (SDOH) Interventions SDOH Screenings   Food Insecurity: No Food Insecurity (11/30/2023)  Housing: Low Risk  (11/30/2023)  Transportation Needs: No Transportation Needs (11/30/2023)  Utilities: Not At Risk (11/30/2023)  Alcohol Screen: Low Risk  (05/13/2023)  Depression (PHQ2-9): Low Risk  (05/08/2023)  Recent Concern: Depression (PHQ2-9) - Medium Risk (03/25/2023)  Financial Resource Strain: Low Risk  (05/13/2023)  Physical Activity: Inactive (05/13/2023)  Social Connections: Socially Integrated (11/30/2023)  Stress: No Stress Concern Present (05/13/2023)  Tobacco Use: Medium Risk (11/30/2023)  Health Literacy: Adequate Health Literacy (03/06/2023)    Readmission Risk Interventions     No data to display

## 2023-12-03 NOTE — Assessment & Plan Note (Addendum)
 Continue home Keppra  250 mg p.o. twice daily, seizure precaution

## 2023-12-03 NOTE — Progress Notes (Signed)
 Progress Note  Patient Name: Diana Cook Date of Encounter: 12/03/2023 [facility] Cardiologist: Lonni Hanson, MD   Interval Summary    Orthostatics positive yesterday from sitting to standing. Started on salt tablets and Northera. Re-check orthostatics today  Vital Signs Vitals:   12/02/23 1158 12/02/23 2031 12/03/23 0407 12/03/23 0757  BP: (!) 146/73 (!) 165/81 (!) 151/74 (!) 122/57  Pulse: 69 61 64 66  Resp: 20 20 16 18   Temp:  98.4 F (36.9 C) 98.4 F (36.9 C) 98 F (36.7 C)  TempSrc:  Oral Oral   SpO2: 94% 97% 95% 97%  Weight:      Height:        Intake/Output Summary (Last 24 hours) at 12/03/2023 0911 Last data filed at 12/03/2023 0410 Gross per 24 hour  Intake 2167.37 ml  Output 1100 ml  Net 1067.37 ml      11/30/2023    9:15 AM 11/24/2023    5:35 AM 11/23/2023    9:15 AM  Last 3 Weights  Weight (lbs) 180 lb 186 lb 11.7 oz 189 lb 9.5 oz  Weight (kg) 81.647 kg 84.7 kg 86 kg      Telemetry/ECG  A sensed V paced. HR 70-80s - Personally Reviewed  Physical Exam  GEN: No acute distress.   Neck: No JVD Cardiac: RRR, no murmurs, rubs, or gallops.  Respiratory: Clear to auscultation bilaterally. GI: Soft, nontender, non-distended  MS: No edema   Cardiac Studies Echo 07/2023 1. Left ventricular ejection fraction, by estimation, is 55 to 60%. Left  ventricular ejection fraction by 2D MOD biplane is 56.1 %. The left  ventricle has normal function. The left ventricle has no regional wall  motion abnormalities. Left ventricular  diastolic parameters are consistent with Grade I diastolic dysfunction  (impaired relaxation).   2. Right ventricular systolic function is normal. The right ventricular  size is normal.   3. The mitral valve is normal in structure. Trivial mitral valve  regurgitation.   4. The aortic valve is tricuspid. Aortic valve regurgitation is not  visualized.    Carotid US  05/2023 Vertebrals:  Bilateral vertebral arteries  demonstrate antegrade flow.  Subclavians: Normal flow hemodynamics were seen in bilateral subclavian arteries.    2d echo 07/16/21   1. Left ventricular ejection fraction, by estimation, is 55 to 60%. The  left ventricle has normal function. Septal wall motion abnormality  consistent with bundle branch block.   2. There is mild left ventricular hypertrophy. Left ventricular diastolic  parameters are consistent with Grade I diastolic dysfunction (impaired  relaxation).   3. Right ventricular systolic function is normal. The right ventricular  size is normal. There is normal pulmonary artery systolic pressure. The  estimated right ventricular systolic pressure is 31.6 mmHg.   4. The mitral valve is normal in structure. No evidence of mitral valve  regurgitation. No evidence of mitral stenosis.   5. The aortic valve was not well visualized. Aortic valve regurgitation  is not visualized. No aortic stenosis is present.   6. The inferior vena cava is normal in size with greater than 50%  respiratory variability, suggesting right atrial pressure of 3 mmHg.      Patient Profile   73 y.o. female  with a hx of moderate coronary artery disease by cardiac CTA (11/2019) and cardiac cath (5/21), carotid artery stenosis status post right carotid stenting (04/2022), complete heart block status post permanent pacemaker followed by EP (last gen change 2021), paroxysmal atrial fibrillation, seizure  disorder, COPD on chronic 4 L of O2, right breast cancer, who is being seen for the evaluation of syncope at the request of Dr Jhonny .     Assessment & Plan   Recurrent syncope with orthostatic hypotension - multiple hospitalizations for syncope and orthostatic hypotension - PTA pyridostigmine  60mg  TID, midodrine  10mg  daily, florinef  0.1mg  BID - patient was not taking pyridostigmine , not wearing abdominal binder and not staying hydrated - continue TED hose, abdominal binder, adequate hydration - started on  salt pill - she remains orthostatic - continue florinef  0.1mg  BID, midodrine  10mg  TID, pyridostigamine 60mg  TID - started on Northera 100mg  TID - daily orthostatics  Hypokalemia - 3.5 this AM  Paroxysmal Afib - continue Eliquis  5mg BID  CHB s/p PPM - normally functioning device - continue with remote uploads  Carotid artery disease - s/p right carotid stent - recent US  showed normal blood flow    For questions or updates, please contact [facility] Please consult [email or web address] for contact info under         Signed, Maame Dack VEAR Fishman, PA-C

## 2023-12-03 NOTE — Assessment & Plan Note (Signed)
 Continue with home midodrine  10 mg 3 times daily with meals, fludrocortisone  0.1 mg p.o. twice daily, pyridostigmine  60 mg 3 times daily

## 2023-12-03 NOTE — Plan of Care (Signed)

## 2023-12-03 NOTE — Care Management Important Message (Signed)
 Important Message  Patient Details  Name: Diana Cook MRN: [id number] Date of Birth: 01/01/1951   Important Message Given:  Yes - Medicare IM     Diana Cook 12/03/2023, 12:43 PM

## 2023-12-03 NOTE — Progress Notes (Signed)
  Device system confirmed to be MRI conditional, with implant date > 6 weeks ago, and no evidence of abandoned or epicardial leads in review of most recent CXR  Device last cleared by EP Provider: Suzann Riddle, 12/03/2023 (Name and date)  Clearance is good through for 1 year as long as parameters remain stable at time of check. If pt undergoes a cardiac device procedure during that time, they should be re-cleared.   Tachy-therapies to be programmed off if applicable with device back to pre-MRI settings after completion of exam.  [employer] was available remotely to assist in programming recommendations.   Bascom LOISE Edelson, RT  12/03/2023 2:32 PM

## 2023-12-03 NOTE — Assessment & Plan Note (Signed)
-   Home atorvastatin 40 mg daily

## 2023-12-04 DIAGNOSIS — Z8669 Personal history of other diseases of the nervous system and sense organs: Secondary | ICD-10-CM | POA: Diagnosis not present

## 2023-12-04 DIAGNOSIS — R55 Syncope and collapse: Secondary | ICD-10-CM | POA: Diagnosis not present

## 2023-12-04 DIAGNOSIS — I442 Atrioventricular block, complete: Secondary | ICD-10-CM

## 2023-12-04 DIAGNOSIS — I951 Orthostatic hypotension: Secondary | ICD-10-CM | POA: Diagnosis not present

## 2023-12-04 DIAGNOSIS — R9089 Other abnormal findings on diagnostic imaging of central nervous system: Secondary | ICD-10-CM | POA: Diagnosis not present

## 2023-12-04 LAB — BASIC METABOLIC PANEL WITH GFR
Anion gap: 10 (ref 5–15)
BUN: 20 mg/dL (ref 8–23)
CO2: 25 mmol/L (ref 22–32)
Calcium: 8.9 mg/dL (ref 8.9–10.3)
Chloride: 98 mmol/L (ref 98–111)
Creatinine, Ser: 0.98 mg/dL (ref 0.44–1.00)
GFR, Estimated: >60 mL/min (ref >60)
Glucose, Bld: 105 mg/dL — ABNORMAL HIGH (ref 70–99)
Potassium: 3.3 mmol/L — ABNORMAL LOW (ref 3.5–5.1)
Sodium: 133 mmol/L — ABNORMAL LOW (ref 135–145)

## 2023-12-04 LAB — CBC
HCT: 36.9 % (ref 36.0–46.0)
Hemoglobin: 11.6 g/dL — ABNORMAL LOW (ref 12.0–15.0)
MCH: 26.2 pg (ref 26.0–34.0)
MCHC: 31.4 g/dL (ref 30.0–36.0)
MCV: 83.5 fL (ref 80.0–100.0)
Platelets: 291 K/uL (ref 150–400)
RBC: 4.42 MIL/uL (ref 3.87–5.11)
RDW: 15.4 % (ref 11.5–15.5)
WBC: 7.7 K/uL (ref 4.0–10.5)
nRBC: 0 % (ref 0.0–0.2)

## 2023-12-04 NOTE — Progress Notes (Signed)
 Pt only able to tolerate orthostatic VS lying and sitting.

## 2023-12-04 NOTE — NC FL2 (Signed)
 [place]  MEDICAID FL2 LEVEL OF CARE FORM     IDENTIFICATION  Patient Name: Diana Cook Birthdate: 1950/10/23 Sex: female Admission Date (Current Location): 11/30/2023  County and [place] Number:  Chiropodist and Address:         Provider Number: [id number]  Attending Physician Name and Address:  Cox, Greig SAILOR, DO  Relative Name and Phone Number:       Current Level of Care: Hospital Recommended Level of Care: Skilled Nursing Facility Prior Approval Number:    Date Approved/Denied:   PASRR Number: [id number]  Discharge Plan: SNF    Current Diagnoses: Patient Active Problem List   Diagnosis Date Noted   Weight loss 12/03/2023   Hypotension 12/03/2023   Recurrent syncope 11/30/2023   Personal history of fall 11/27/2023   Nausea & vomiting 11/24/2023   Community acquired pneumonia 11/24/2023   Iron  deficiency 11/02/2023   Syncope and collapse 09/26/2023   Hypokalemia 09/26/2023   Carotid stenosis s/p stents 09/26/2023   Chronic anticoagulation 09/26/2023   Physical deconditioning 09/18/2023   Syncope 08/29/2023   Orthostasis 08/28/2023   Decreased GFR 04/29/2023   Presence of heart assist device (HCC) 03/25/2023   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/25/2023   Persistent cough 03/03/2023   Urinary urgency 02/09/2023   Carotid stenosis, symptomatic w/o infarct 03/23/2022   Right shoulder pain 11/25/2021   Anemia 11/03/2021   Thyroid  nodule 09/17/2021   Dyslipidemia 09/05/2021   Depression 09/05/2021   GERD without esophagitis 09/05/2021   Vitamin D  deficiency 08/20/2021   Colon cancer screening 07/30/2021   Hypercalcemia 07/29/2021   UTI (urinary tract infection) 07/17/2021   Weakness 07/08/2021   Dysuria 07/08/2021   Arterial hypotension 07/08/2021   Paroxysmal atrial fibrillation (HCC) 04/11/2021   Current use of proton pump inhibitor 10/24/2020   Sleep apnea 10/24/2020   Fatigue 10/24/2020   Lightheadedness 05/16/2020    Radicular pain in left arm 03/02/2020   Neck pain on left side 02/15/2020   Paresthesia 02/15/2020   Chronic hypoxic respiratory failure (HCC) 11/17/2019   Chronic heart failure with preserved ejection fraction (HFpEF) (HCC) 08/11/2019   Coronary artery disease of native artery of native heart with stable angina pectoris 06/20/2019   Gastroesophageal reflux disease    Stomach irritation    Abnormal CT scan, esophagus    Polyp of ascending colon    Personal history of tobacco use, presenting hazards to health 01/18/2018   Grade I diastolic dysfunction 12/21/2017   Globus pharyngeus 12/21/2017   Chronic obstructive pulmonary disease (HCC) 12/15/2017   Hyperlipidemia 12/07/2017   Seizures (HCC) 10/01/2016   Grief reaction 04/11/2016   History of breast cancer 12/11/2015   Osteoporosis 11/22/2015   Estrogen deficiency 09/07/2015   Wrinkles 09/07/2015   Post herpetic neuralgia 09/01/2014   Encounter for Medicare annual wellness exam 07/11/2014   Routine general medical examination at a health care facility 06/26/2013   H/O small bowel obstruction 04/15/2013   SOB (shortness of breath) 02/10/2013   Complete heart block s/p pacemaker (HCC) 11/03/2011   Pacemaker-[employer] 11/03/2011   Lumbar spinal stenosis 09/02/2011   POSTMENOPAUSAL STATUS 08/06/2009   HIDRADENITIS SUPPURATIVA 06/26/2008   HERNIATED DISC 12/14/2007   Hyperkalemia 08/06/2007   History of colonic polyps 09/14/2006   Former smoker 06/10/2006   Asthma 06/10/2006   H/O idiopathic seizure 06/10/2006    Orientation RESPIRATION BLADDER Height & Weight     Self, Time, Situation, Place  Normal Continent Weight: 81.6 kg Height:  5' 9 (175.3 cm)  BEHAVIORAL SYMPTOMS/MOOD NEUROLOGICAL BOWEL NUTRITION STATUS      Continent Diet (Heart Healthy)  AMBULATORY STATUS COMMUNICATION OF NEEDS Skin   Extensive Assist   Normal                       Personal Care Assistance Level of Assistance               Functional Limitations Info             SPECIAL CARE FACTORS FREQUENCY  PT (By licensed PT), OT (By licensed OT)                    Contractures Contractures Info: Not present    Additional Factors Info  Code Status, Allergies Code Status Info: Full Allergies Info: Codeine, Morphine  And Codeine           Current Medications (12/04/2023):  This is the current hospital active medication list Current Facility-Administered Medications  Medication Dose Route Frequency Provider Last Rate Last Admin   acetaminophen  (TYLENOL ) tablet 650 mg  650 mg Oral Q6H PRN Sreenath, Sudheer B, MD   650 mg at 12/03/23 9365   Or   acetaminophen  (TYLENOL ) suppository 650 mg  650 mg Rectal Q6H PRN Jhonny Sahara B, MD       apixaban  (ELIQUIS ) tablet 5 mg  5 mg Oral BID Quale, Mark, MD   5 mg at 12/04/23 9141   aspirin  EC tablet 81 mg  81 mg Oral Daily Sreenath, Sudheer B, MD   81 mg at 12/04/23 9141   atorvastatin  (LIPITOR) tablet 40 mg  40 mg Oral Daily Sreenath, Sudheer B, MD   40 mg at 12/04/23 9141   benzonatate  (TESSALON ) capsule 200 mg  200 mg Oral BID PRN Sreenath, Sudheer B, MD       bisacodyl  (DULCOLAX) suppository 10 mg  10 mg Rectal Daily PRN Sreenath, Sudheer B, MD   10 mg at 12/01/23 0530   droxidopa (NORTHERA) capsule 100 mg  100 mg Oral TID WC Gollan, Timothy J, MD   100 mg at 12/04/23 1347   fludrocortisone  (FLORINEF ) tablet 0.1 mg  0.1 mg Oral BID Arida, Muhammad A, MD   0.1 mg at 12/04/23 1030   FLUoxetine  (PROZAC ) capsule 20 mg  20 mg Oral Daily Sreenath, Sudheer B, MD   20 mg at 12/04/23 1035   ipratropium-albuterol  (DUONEB) 0.5-2.5 (3) MG/3ML nebulizer solution 3 mL  3 mL Nebulization Q6H PRN Sreenath, Sudheer B, MD       iron  polysaccharides (NIFEREX) capsule 150 mg  150 mg Oral Daily Sreenath, Sudheer B, MD   150 mg at 12/04/23 9096   levETIRAcetam  (KEPPRA ) tablet 250 mg  250 mg Oral BID Quale, Mark, MD   250 mg at 12/04/23 9097   midodrine  (PROAMATINE ) tablet 10 mg   10 mg Oral TID WC Dicky Anes, MD   10 mg at 12/04/23 1347   ondansetron  (ZOFRAN ) tablet 4 mg  4 mg Oral Q6H PRN Jhonny Sahara B, MD       Or   ondansetron  (ZOFRAN ) injection 4 mg  4 mg Intravenous Q6H PRN Sreenath, Sudheer B, MD   4 mg at 12/04/23 0856   oxyCODONE  (Oxy IR/ROXICODONE ) immediate release tablet 5 mg  5 mg Oral Q4H PRN Sreenath, Sudheer B, MD       polyethylene glycol (MIRALAX  / GLYCOLAX ) packet 17 g  17 g Oral Daily Sreenath, Sudheer B, MD  17 g at 12/04/23 0901   pyridostigmine  (MESTINON ) tablet 60 mg  60 mg Oral TID Dicky Anes, MD   60 mg at 12/04/23 0901   senna-docusate (Senokot-S) tablet 1 tablet  1 tablet Oral BID Sreenath, Sudheer B, MD   1 tablet at 12/04/23 9141   sodium chloride  tablet 1 g  1 g Oral BID WC Gollan, Timothy J, MD   1 g at 12/04/23 9141   zolpidem  (AMBIEN ) tablet 5 mg  5 mg Oral QHS PRN Sreenath, Sudheer B, MD   5 mg at 12/03/23 2134     Discharge Medications: Please see discharge summary for a list of discharge medications.  Relevant Imaging Results:  Relevant Lab Results:   Additional Information SSN:[id number]  [name], RN

## 2023-12-04 NOTE — Progress Notes (Signed)
 Mobility Specialist - Progress Note   12/04/23 1131  Mobility  Activity Stood at bedside;Pivoted/transferred from bed to chair  Level of Assistance Contact guard assist, steadying assist  Assistive Device Front wheel walker  Distance Ambulated (ft) 4 ft  Activity Response Tolerated well  Mobility visit 1 Mobility  Mobility Specialist Start Time (ACUTE ONLY) 1109  Mobility Specialist Stop Time (ACUTE ONLY) 1120  Mobility Specialist Time Calculation (min) (ACUTE ONLY) 11 min   MS responding to bed alarm. Pt requesting to transfer to the recliner this date. Abd binder donned, denied use of ted hose. Pt completed bed mob ModI, extra time required once seated EOB d/t dizziness. Pt STS to RW, transferred via SPT CGA +2 for safety. Pt left seated in the recliner with alarm set and needs within reach.  America Silvan Mobility Specialist 12/04/23 12:08 PM

## 2023-12-04 NOTE — Consult Note (Addendum)
 NEUROLOGY CONSULT NOTE   Date of service: December 04, 2023 Patient Name: Diana Cook MRN:  [id number] DOB:  12/30/1950 Chief Complaint: LOC Requesting Provider: Sherre Greig SAILOR, DO  History of Present Illness  Danita C Hogans is a 73 y.o. female with hx of seizures, with the most recent seizure being shortly after the death of her daughter 8 years ago. She started passing out during the summer.  At first it would be occasional, always after she stood up.  She notes that she will sometimes stand up and feel right for up to a minute before then passing out.  She describes that she gets lightheaded, passes out and is out briefly, and then comes to without significant postictal period.  She does not shake or have any seizure like activity with these.  She has never had any in the supine position, and has gotten lightheaded in the sitting position but only with recent positional change.  In retrospect, she does think that she was having gait dysfunction even prior to the passing out, but is not sure exactly when that started.   She has been treated with leg and abdominal binders, droxidopa Florinef , Prozac , midodrine , and pyridostigmine .  These interventions provided help initially, but despite these interventions she is currently passing out about every other day.  She also has a history of complete heart block status post pacemaker in 2011.   As part of her workup she had an MRI of her brain which demonstrates a hazy T2 signal in the left posterior quadrant felt to reflect either cortical dysplasia or low-grade glioma.  She has not had an MRI of her brain since 1986, and I think it is very possible that this finding could have been missed on the scans at this time as it is significantly easier to see on FLAIR than T2.  Past History   Past Medical History:  Diagnosis Date   Allergic rhinitis    Arthritis    Asthma    as a child, mild now   Breast cancer (HCC) 12/2015   right breast cancer,  lumpectomy and mammosite    Cardiac pacemaker in situ    Colon polyps    colonoscopy 7/08, tubular adenoma   Complete heart block (HCC) 09/2011   s/p PPM implanted in [facility]   COPD (chronic obstructive pulmonary disease) (HCC)    Myocardial infarction (HCC) 2011   Orthostatic hypotension    Osteopenia 10/2015   Pacemaker    2011   Paroxysmal atrial fibrillation (HCC) 03/2021   Incidentally detected on pacemaker interrogation   Personal history of radiation therapy 2017   right breast ca, mammosite placed   Rotator cuff tear, right 02/2022   Had an MRI in [place] I have 2 tears.   Seizure disorder (HCC)    Seizures (HCC)    first one was when she was 73 years old    Small bowel obstruction (HCC)    1988 and 2002   Tobacco abuse     Past Surgical History:  Procedure Laterality Date   ABDOMINAL HYSTERECTOMY     BREAST BIOPSY Right 2007   benign inflammatory changes, mass due to underwire bra   BREAST BIOPSY Left 01/02/2016   columnar cell changes without atypical hyperplasia.   BREAST BIOPSY Right 12/06/2015   rt breast mass 10:00, bx done at Dr. Gilberto office, invasive ductal carcinoma   BREAST EXCISIONAL BIOPSY Left 01/02/2016   COLUMNAR CELL CHANGE AND HYPERPLASIA ASSOCIATED WITH LUMINAL  AND STROMAL CALCIFICATIONS   BREAST LUMPECTOMY Right 01/02/2016   invasive mammary carcinoma, clear margins, negative LN   BREAST LUMPECTOMY WITH SENTINEL LYMPH NODE BIOPSY Right 01/02/2016   pT1c, N0; ER/ PR 100%; Her 2 neu not over expressed: BREAST LUMPECTOMY WITH SENTINEL LYMPH NODE BX;  Surgeon: Reyes LELON Cota, MD;  Location: [facility] ORS;  Service: General;  Laterality: Right;   CARDIAC CATHETERIZATION     CAROTID PTA/STENT INTERVENTION Right 04/29/2022   Procedure: CAROTID PTA/STENT INTERVENTION;  Surgeon: Jama Cordella MATSU, MD;  Location: [facility] INVASIVE CV LAB;  Service: Cardiovascular;  Laterality: Right;   CATARACT EXTRACTION Bilateral    COLONOSCOPY  10/2015   Dr Teressa    COLONOSCOPY WITH PROPOFOL  N/A 08/19/2018   Procedure: COLONOSCOPY WITH PROPOFOL ;  Surgeon: Janalyn Keene NOVAK, MD;  Location: [facility] ENDOSCOPY;  Service: Endoscopy;  Laterality: N/A;   ESOPHAGOGASTRODUODENOSCOPY (EGD) WITH PROPOFOL  N/A 08/19/2018   Procedure: ESOPHAGOGASTRODUODENOSCOPY (EGD) WITH PROPOFOL ;  Surgeon: Janalyn Keene NOVAK, MD;  Location: [facility] ENDOSCOPY;  Service: Endoscopy;  Laterality: N/A;   EXPLORATORY LAPAROTOMY  01/25/2001   Exploratory laparotomy, lysis of adhesions, identification of internal hernia secondary to omental adhesion. Prolonged postoperative ileus.   gyn surgery  1993   hysterectomy- form endometriosis   LAPAROSCOPY     PACEMAKER INSERTION  10/24/2011   [employer] Advantio dual chamber PPM implanted by Dr Ronny at [facility] in [place]   PPM GENERATOR CHANGEOUT N/A 12/05/2019   Procedure: PPM GENERATOR HERMA;  Surgeon: Fernande Elspeth BROCKS, MD;  Location: [facility] INVASIVE CV LAB;  Service: Cardiovascular;  Laterality: N/A;   RIGHT/LEFT HEART CATH AND CORONARY ANGIOGRAPHY Bilateral 07/19/2019   Procedure: RIGHT/LEFT HEART CATH AND CORONARY ANGIOGRAPHY;  Surgeon: Mady Bruckner, MD;  Location: [facility] INVASIVE CV LAB;  Service: Cardiovascular;  Laterality: Bilateral;   TEMPORARY PACEMAKER N/A 12/05/2019   Procedure: TEMPORARY PACEMAKER;  Surgeon: Fernande Elspeth BROCKS, MD;  Location: [facility] INVASIVE CV LAB;  Service: Cardiovascular;  Laterality: N/A;    Family History: Family History  Problem Relation Age of Onset   Stroke Mother    Heart disease Mother 46       MI and CABG   Dementia Mother    Coronary artery disease Father    Parkinsonism Father    Breast cancer Sister    Cancer - Cervical Daughter 17       died 03/31/2023   Heart attack Brother 15   Colon cancer Neg Hx     Social History  reports that she quit smoking about 13 years ago. Her smoking use included cigarettes. She started smoking about 55 years ago. She has a 63  pack-year smoking history. She has never used smokeless tobacco. She reports current alcohol use of about 1.0 - 2.0 standard drink of alcohol per week. She reports that she does not use drugs.  Allergies  Allergen Reactions   Codeine Nausea And Vomiting   Morphine  And Codeine Nausea Only    Medications   Current Facility-Administered Medications:    acetaminophen  (TYLENOL ) tablet 650 mg, 650 mg, Oral, Q6H PRN, 650 mg at 12/03/23 0634 **OR** acetaminophen  (TYLENOL ) suppository 650 mg, 650 mg, Rectal, Q6H PRN, Sreenath, Sudheer B, MD   apixaban  (ELIQUIS ) tablet 5 mg, 5 mg, Oral, BID, Quale, Mark, MD, 5 mg at 12/04/23 0858   aspirin  EC tablet 81 mg, 81 mg, Oral, Daily, Sreenath, Sudheer B, MD, 81 mg at 12/04/23 0858   atorvastatin  (LIPITOR) tablet 40 mg, 40 mg, Oral, Daily, Sreenath,  Calvin NOVAK, MD, 40 mg at 12/04/23 9141   benzonatate  (TESSALON ) capsule 200 mg, 200 mg, Oral, BID PRN, Sreenath, Sudheer B, MD   bisacodyl  (DULCOLAX) suppository 10 mg, 10 mg, Rectal, Daily PRN, Jhonny, Sudheer B, MD, 10 mg at 12/01/23 0530   droxidopa (NORTHERA) capsule 100 mg, 100 mg, Oral, TID WC, Gollan, Timothy J, MD, 100 mg at 12/04/23 1700   fludrocortisone  (FLORINEF ) tablet 0.1 mg, 0.1 mg, Oral, BID, Arida, Muhammad A, MD, 0.1 mg at 12/04/23 1030   FLUoxetine  (PROZAC ) capsule 20 mg, 20 mg, Oral, Daily, Sreenath, Sudheer B, MD, 20 mg at 12/04/23 1035   ipratropium-albuterol  (DUONEB) 0.5-2.5 (3) MG/3ML nebulizer solution 3 mL, 3 mL, Nebulization, Q6H PRN, Sreenath, Sudheer B, MD   iron  polysaccharides (NIFEREX) capsule 150 mg, 150 mg, Oral, Daily, Sreenath, Sudheer B, MD, 150 mg at 12/04/23 9096   levETIRAcetam  (KEPPRA ) tablet 250 mg, 250 mg, Oral, BID, Quale, Mark, MD, 250 mg at 12/04/23 9097   midodrine  (PROAMATINE ) tablet 10 mg, 10 mg, Oral, TID WC, Quale, Mark, MD, 10 mg at 12/04/23 1700   ondansetron  (ZOFRAN ) tablet 4 mg, 4 mg, Oral, Q6H PRN **OR** ondansetron  (ZOFRAN ) injection 4 mg, 4 mg, Intravenous,  Q6H PRN, Jhonny, Sudheer B, MD, 4 mg at 12/04/23 0856   oxyCODONE  (Oxy IR/ROXICODONE ) immediate release tablet 5 mg, 5 mg, Oral, Q4H PRN, Sreenath, Sudheer B, MD   polyethylene glycol (MIRALAX  / GLYCOLAX ) packet 17 g, 17 g, Oral, Daily, Sreenath, Sudheer B, MD, 17 g at 12/04/23 0901   pyridostigmine  (MESTINON ) tablet 60 mg, 60 mg, Oral, TID, Quale, Mark, MD, 60 mg at 12/04/23 1700   senna-docusate (Senokot-S) tablet 1 tablet, 1 tablet, Oral, BID, Sreenath, Sudheer B, MD, 1 tablet at 12/04/23 0858   sodium chloride  tablet 1 g, 1 g, Oral, BID WC, Gollan, Timothy J, MD, 1 g at 12/04/23 1700   zolpidem  (AMBIEN ) tablet 5 mg, 5 mg, Oral, QHS PRN, Jhonny, Sudheer B, MD, 5 mg at 20-Dec-2023 2134  Vitals   Vitals:   12/04/23 1416 12/04/23 1418 12/04/23 1700 12/04/23 2013  BP: (!) 141/67 118/60 (!) 135/53 (!) 181/79  Pulse: 72 77 79 77  Resp: (!) 22  17 18   Temp: 98 F (36.7 C)  98.4 F (36.9 C) 99.3 F (37.4 C)  TempSrc: Oral  Oral Oral  SpO2: 96%  97% 91%  Weight:      Height:        Body mass index is 26.58 kg/m.   Physical Exam   Constitutional: Appears well-developed and well-nourished. Neurologic Examination    Neuro: Mental Status: Patient is awake, alert, oriented to person, place, month, year, and situation. Patient is able to give a clear and coherent history. No signs of aphasia or neglect Cranial Nerves: II: Visual Fields are full. Pupils are asymmetric, L larger than right, round, and reactive to light.   III,IV, VI: EOMI without ptosis or diploplia.  V: Facial sensation is symmetric to temperature VII: Facial movement is symmetric.  VIII: hearing is intact to voice X: Uvula elevates symmetrically XII: tongue is midline without atrophy or fasciculations.  Motor: Tone is normal. Bulk is normal. 5/5 strength was present in all four extremities.  Sensory: She has mild decrease to LT to the ankles Deep Tendon Reflexes: 2+ and symmetric in the biceps and patellae,  and ankles.  Cerebellar: FNF intact bilaterally        Labs/Imaging/Neurodiagnostic studies   CBC:  Recent Labs  Lab Dec 20, 2023 1243 12/04/23  0455  WBC 10.7* 7.7  HGB 12.9 11.6*  HCT 40.2 36.9  MCV 83.2 83.5  PLT 289 291   Basic Metabolic Panel:  Lab Results  Component Value Date   NA 133 (L) 12/04/2023   K 3.3 (L) 12/04/2023   CO2 25 12/04/2023   GLUCOSE 105 (H) 12/04/2023   BUN 20 12/04/2023   CREATININE 0.98 12/04/2023   CALCIUM  8.9 12/04/2023   GFRNONAA >60 12/04/2023   GFRAA 60 11/16/2019   Lipid Panel:  Lab Results  Component Value Date   LDLCALC 72 03/18/2023   HgbA1c:  Lab Results  Component Value Date   HGBA1C 6.2 03/18/2023   Urine Drug Screen: No results found for: LABOPIA, COCAINSCRNUR, LABBENZ, AMPHETMU, THCU, LABBARB  Alcohol Level No results found for: [facility] INR  Lab Results  Component Value Date   INR 1.5 (H) 11/24/2023   APTT  Lab Results  Component Value Date   APTT 30.9 07/21/2013   AED levels:  Lab Results  Component Value Date   PHENYTOIN  16.2 10/07/2016   MRI Brain(Personally reviewed): Hazy T2 cortical signal change in the left posterior quadrant     ASSESSMENT   Everly C Auxier is a 73 y.o. female with a history of a seizure disorder who presents with syncope related to severe orthostasis.  The rapid progression of her orthostasis is highly concerning, and I would be quite concerned about multiple system atrophy or pure autonomic failure.  She does not have any other findings characteristic of small fiber neuropathy(e.g pain). I think that she would benefit from a referral to an autonomic specialist if she is not seeing one already.  Her MRI finding is of unclear significance, but could represent a seizure focus if it is cortical dysplasia. Low grade glioma is also possible, and I would favor repeating the MRI in a few months to ensure stability. No clinical signs to suggest cerebritis.   RECOMMENDATIONS   Continue treating orthostasis Would likely benefit from EMG and/or QSART as outpatient Send nAChR antibodies Continue home keppra  Will follow ______________________________________________________________________    Signed, Aisha Seals, MD [facility]

## 2023-12-04 NOTE — Progress Notes (Addendum)
 Pt called via the call bell, stating she had fallen.  Upon assessment, pt was found sitting on the floor next to bed.  Pt states she climbed over the bedside rail in an attempt to use the bedside commode and became dizzy.  Pt denies hitting anything and denies any pain at this time.  Pt A&O x4 but states she had briefly forgotten where she was when she awakened and that she had a purwik in place.  Cleatus, MD, made aware.  Husband notified of same.

## 2023-12-04 NOTE — Progress Notes (Addendum)
 PROGRESS NOTE    Diana Cook  FMW:[id number] DOB: [birth year] DOA: 11/30/2023 PCP: Randeen Laine LABOR, MD   No notes on file  Brief Narrative:   [name] is a 73 year old female with history of seizure, hyperlipidemia, CAD, hypertension, orthostasis, complete heart block status post pacemaker placement, recurrent syncope, weight loss, who presents to emergency department for chief concerns of syncope.  Assessment & Plan:   Principal Problem:   Recurrent syncope Active Problems:   Orthostasis   Complete heart block s/p pacemaker (HCC)   Seizures (HCC)   Hyperlipidemia   Coronary artery disease of native artery of native heart with stable angina pectoris   Weakness   Weight loss   Hypotension   Assessment and Plan:  * Recurrent syncope With new onset headaches and patient endorses weight loss Workup in progress including neoplasm MRI of the brain with and without contrast ordered Fall precaution, PT, OT  12/04/2023: Overnight it was reported that patient had a syncopal event.  At bedside, patient is awake alert and oriented x 3.  Patient reports that she was trying to get to the bedside commode when she suddenly felt dizzy and the next thing she knew she was on the floor.  She did not remember having control of falling or turning back to the bed so that she could prevent a fall.  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  Neurology was consulted who states the syncopal events are unlikely to be glioma related.  Likely seizure related.  Neurology consulted for possible seizure medication modification and management.  Seizure precaution in place.  Patient and her spouse [name] was updated over the phone via speaker well at bedside with the patient.  Seizures (HCC) Resume home Keppra  250 mg p.o. twice daily  Hyperlipidemia Home atorvastatin  40 mg daily  Hypotension Continue with home  midodrine  10 mg 3 times daily with meals, fludrocortisone  0.1 mg p.o. twice daily, pyridostigmine  60 mg 3 times daily  DVT prophylaxis: Apixaban  5 mg p.o. twice daily Code Status: DNR/DNI Family Communication: Updated husband Diana Cook over the phone on speaker while at patient's room via patient's phone Disposition Plan:  Level of care: Telemetry Medical  Consultants:  Neurology  Procedures:  None at this time  Antimicrobials: Not indicated at this time   Subjective:  At bedside, patient is sitting with oxygen  supplementation in place.  She does not appear to be in acute distress.  She is awake alert and oriented x 4, name, age, location, current calendar year.  Reports that overnight, she had a syncopal event.  She reports that she was getting out of her bed to attempt to use the bedside commode.  She reports that upon transferring, she felt dizzy and the next thing she knew she was in the floor.  She had no control over not falling or going back to the bed so that she could prevent herself from falling on the floor.  She reports that the last known time she had a seizure was many years ago.  On the speaker phone with her husband, he reports that when she has the syncopal events, patient appears very out of it and he is not able to have a cognizant discussion with her.  He does not know if she is having a seizure or just too weak to speak.  Objective: Vitals:   12/03/23 1642 12/03/23 2104 12/04/23 0358 12/04/23  0729  BP: (!) 155/72 (!) 164/74 131/68 129/62  Pulse: 73 76 80 75  Resp: 18 18 18 15   Temp:  97.8 F (36.6 C) (!) 97.4 F (36.3 C) 97.7 F (36.5 C)  TempSrc:   Oral Oral  SpO2: 98% 94% 94% 93%  Weight:      Height:        Intake/Output Summary (Last 24 hours) at 12/04/2023 1414 Last data filed at 12/04/2023 1404 Gross per 24 hour  Intake 460 ml  Output 500 ml  Net -40 ml   Filed Weights   11/30/23 0915  Weight: 81.6 kg   Examination:  General exam:  Appears calm and comfortable  Respiratory system: Clear to auscultation. Respiratory effort normal. Cardiovascular system: S1 & S2 heard, RRR. No JVD, murmurs, rubs, gallops or clicks. No pedal edema. Gastrointestinal system: Abdomen is nondistended, soft and nontender. No organomegaly or masses felt. Normal bowel sounds heard. Central nervous system: Alert and oriented. No focal neurological deficits. Extremities: Symmetric 5 x 5 power. Skin: No rashes, lesions or ulcers Psychiatry: Judgement and insight appear normal. Mood & affect appropriate.   Data Reviewed: I have personally reviewed following labs and imaging studies  CBC: Recent Labs  Lab 11/30/23 0926 12/01/23 0421 12/03/23 1243 12/04/23 0455  WBC 6.5 8.1 10.7* 7.7  HGB 13.6 11.9* 12.9 11.6*  HCT 43.9 37.4 40.2 36.9  MCV 84.6 82.9 83.2 83.5  PLT 338 300 289 291   Basic Metabolic Panel: Recent Labs  Lab 11/30/23 0926 12/01/23 0421 12/03/23 1243  NA 137 135 136  K 5.6* 3.5 3.5  CL 101 102 101  CO2 24 24 26   GLUCOSE 116* 101* 99  BUN 21 22 13   CREATININE 1.01* 0.63 0.76  CALCIUM  9.8 9.1 9.0   GFR: Estimated Creatinine Clearance: 69.4 mL/min (by C-G formula based on SCr of 0.76 mg/dL).  Radiology Studies: MR BRAIN W WO CONTRAST Result Date: 12/03/2023 CLINICAL DATA:  Headache, increasing frequency or severity Headache, new onset (Age >= 51y). Recurrent weakness, hypotension, dizziness, and weight loss. EXAM: MRI HEAD WITHOUT AND WITH CONTRAST TECHNIQUE: Multiplanar, multiecho pulse sequences of the brain and surrounding structures were obtained without and with intravenous contrast. CONTRAST:  8mL GADAVIST GADOBUTROL 1 MMOL/ML IV SOLN COMPARISON:  Head CT 09/14/2023 FINDINGS: Brain: There is no evidence of an acute infarct, midline shift, hydrocephalus, or extra-axial fluid collection. A few scattered chronic microhemorrhages are noted in the cerebral hemispheres and brainstem, nonspecific. Patchy T2 hyperintensities  in the cerebral white matter bilaterally are nonspecific but compatible with moderate chronic small vessel ischemic disease. There is an approximately 2.5 cm region of mild hazy T2 FLAIR hyperintensity in the inferior left occipital lobe involving cortex and juxtacortical white matter with blurring of the gray-white junction, possible slight gyral expansion, and no associated enhancement or diffusion abnormality (series 9, image 19 and series 15, image 7). Mild cerebral atrophy is within normal limits for age. No abnormal intracranial enhancement is identified. Vascular: Major intracranial vascular flow voids are preserved. Skull and upper cervical spine: Unremarkable bone marrow signal. Sinuses/Orbits: Bilateral cataract extraction. Paranasal sinuses and mastoid air cells are clear. Other: None. IMPRESSION: 1. 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 Short-term MRI follow-up is recommended. 2. Moderate chronic small vessel ischemic disease. Electronically Signed   By: Dasie Hamburg M.D.   On: 12/03/2023 20:20   Scheduled Meds:  apixaban   5 mg Oral BID   aspirin  EC  81 mg Oral Daily   atorvastatin   40 mg Oral Daily   droxidopa  100 mg Oral TID WC   fludrocortisone   0.1 mg Oral BID   FLUoxetine   20 mg Oral Daily   iron  polysaccharides  150 mg Oral Daily   levETIRAcetam   250 mg Oral BID   midodrine   10 mg Oral TID WC   polyethylene glycol  17 g Oral Daily   pyridostigmine   60 mg Oral TID   senna-docusate  1 tablet Oral BID   sodium chloride   1 g Oral BID WC   Continuous Infusions:   LOS: 3 days   Time spent: 50 minutes  Dr. Sherre [facility]  If 7PM-7AM, please contact night-coverage  12/04/2023, 2:14 PM

## 2023-12-04 NOTE — TOC Progression Note (Signed)
 Transition of Care [facility]) - Progression Note    Patient Details  Name: Diana Cook MRN: [id number] Date of Birth: 1951-02-09  Transition of [facility] CM/SW Contact  [name], RN Phone Number: 12/04/2023, 3:27 PM  Clinical Narrative:    Therapy recs have changed to to SNF Patient in agreement and first choice is [facility] Fl[facility] sent signature Bed search initiated  Per [facility] at [facility] she will likely have a bed available on Monday.   Patient will require insurance auth when medically appropriate.  Patient will be in copay days.  $80/day days 21-31 and $0 per day days 32-100.  Patient notified                     Expected Discharge Plan and Services                                               Social Drivers of Health (SDOH) Interventions SDOH Screenings   Food Insecurity: No Food Insecurity (11/30/2023)  Housing: Low Risk  (11/30/2023)  Transportation Needs: No Transportation Needs (11/30/2023)  Utilities: Not At Risk (11/30/2023)  Alcohol Screen: Low Risk  (05/13/2023)  Depression (PHQ2-9): Low Risk  (05/08/2023)  Recent Concern: Depression (PHQ2-9) - Medium Risk (03/25/2023)  Financial Resource Strain: Low Risk  (05/13/2023)  Physical Activity: Inactive (05/13/2023)  Social Connections: Socially Integrated (11/30/2023)  Stress: No Stress Concern Present (05/13/2023)  Tobacco Use: Medium Risk (11/30/2023)  Health Literacy: Adequate Health Literacy (03/06/2023)    Readmission Risk Interventions     No data to display

## 2023-12-04 NOTE — Progress Notes (Signed)
 Progress Note  Patient Name: Diana Cook Date of Encounter: 12/04/2023 [facility] Cardiologist: Lonni Hanson, MD   Interval Summary    Patient had syncope overnight, but she feels episodes are less frequent. She is wearing compression socks and abdominal binder.   Vital Signs Vitals:   12/04/23 0358 12/04/23 0729 12/04/23 1416 12/04/23 1418  BP: 131/68 129/62 (!) 141/67 118/60  Pulse: 80 75 72 77  Resp: 18 15 (!) 22   Temp: (!) 97.4 F (36.3 C) 97.7 F (36.5 C) 98 F (36.7 C)   TempSrc: Oral Oral Oral   SpO2: 94% 93% 96%   Weight:      Height:        Intake/Output Summary (Last 24 hours) at 12/04/2023 1429 Last data filed at 12/04/2023 1404 Gross per 24 hour  Intake 460 ml  Output 500 ml  Net -40 ml      11/30/2023    9:15 AM 11/24/2023    5:35 AM 11/23/2023    9:15 AM  Last 3 Weights  Weight (lbs) 180 lb 186 lb 11.7 oz 189 lb 9.5 oz  Weight (kg) 81.647 kg 84.7 kg 86 kg      Telemetry/ECG  N/A - Personally Reviewed  Physical Exam  GEN: No acute distress.   Neck: No JVD Cardiac: RRR, no murmurs, rubs, or gallops.  Respiratory: Clear to auscultation bilaterally. GI: Soft, nontender, non-distended  MS: No edema   Cardiac Studies Echo 07/2023 1. Left ventricular ejection fraction, by estimation, is 55 to 60%. Left  ventricular ejection fraction by 2D MOD biplane is 56.1 %. The left  ventricle has normal function. The left ventricle has no regional wall  motion abnormalities. Left ventricular  diastolic parameters are consistent with Grade I diastolic dysfunction  (impaired relaxation).   2. Right ventricular systolic function is normal. The right ventricular  size is normal.   3. The mitral valve is normal in structure. Trivial mitral valve  regurgitation.   4. The aortic valve is tricuspid. Aortic valve regurgitation is not  visualized.    Carotid US  05/2023 Vertebrals:  Bilateral vertebral arteries demonstrate antegrade flow.   Subclavians: Normal flow hemodynamics were seen in bilateral subclavian arteries.    2d echo 07/16/21   1. Left ventricular ejection fraction, by estimation, is 55 to 60%. The  left ventricle has normal function. Septal wall motion abnormality  consistent with bundle branch block.   2. There is mild left ventricular hypertrophy. Left ventricular diastolic  parameters are consistent with Grade I diastolic dysfunction (impaired  relaxation).   3. Right ventricular systolic function is normal. The right ventricular  size is normal. There is normal pulmonary artery systolic pressure. The  estimated right ventricular systolic pressure is 31.6 mmHg.   4. The mitral valve is normal in structure. No evidence of mitral valve  regurgitation. No evidence of mitral stenosis.   5. The aortic valve was not well visualized. Aortic valve regurgitation  is not visualized. No aortic stenosis is present.   6. The inferior vena cava is normal in size with greater than 50%  respiratory variability, suggesting right atrial pressure of 3 mmHg.       Patient Profile   73 y.o. female  with a hx of moderate coronary artery disease by cardiac CTA (11/2019) and cardiac cath (5/21), carotid artery stenosis status post right carotid stenting (04/2022), complete heart block status post permanent pacemaker followed by EP (last gen change 2021), paroxysmal atrial fibrillation, seizure disorder,  COPD on chronic 4 L of O2, right breast cancer, who is being seen for the evaluation of syncope at the request of Dr Jhonny .     Assessment & Plan   Recurrent syncope with orthostatic hypotension - multiple hospitalizations for syncope and orthostatic hypotension - PTA pyridostigmine  60mg  TID, midodrine  10mg  daily, florinef  0.1mg  BID - patient was not taking pyridostigmine , not wearing abdominal binder and not staying hydrated - continue TED hose, abdominal binder, adequate hydration - continue salt pill - continue florinef   0.1mg  BID, midodrine  10mg  TID, pyridostigamine 60mg  TID, and Northera 100mg  TID - daily orthostatics - hopefully as she works with PT, symptoms will improve   Hypokalemia - 3.5 this AM   Paroxysmal Afib - continue Eliquis  5mg BID   CHB s/p PPM - normally functioning device - continue with remote uploads   Carotid artery disease - s/p right carotid stent - recent US  showed normal blood flow    For questions or updates, please contact [facility] Please consult [email or web address] for contact info under         Signed, Aleida Crandell VEAR Fishman, PA-C

## 2023-12-04 NOTE — Progress Notes (Signed)
 Occupational Therapy Treatment Patient Details Name: Diana Cook MRN: [id number] DOB: 05/26/1950 Today's Date: 12/04/2023   History of present illness 73 y.o. female with PMHx of CAD, carotid artery stenosis s/p right carotid stent, Dorette complete heart block s/p permanent pacemaker, PAF on anticoagulation, seizure disorder, COPD on chronic 4 L, right breast cancer, and known history of severe refractory orthostatic hypotension who is seen in the emergency room with chief complaint of recurrent syncope.   OT comments  Pt seen for OT tx. Pt in bed, politely declines EOB/OOB 2/2 fatigue. Ongoing orthostatic hypotension. Pt instructed in falls prevention, home/routines modifications, DME placement and body positioning to support safe pivot/scoot transfers. Pt endorses difficulty with medication management following previous discharge from facility. OT facilitated problem solving and educated pt in medication mgt strategies to improve safety and adherence. Pt verbalized understanding. Continues to benefit from skilled OT services to maximize safety. Given recent falls history, continued medical issues causing high falls risk and limiting pt's ability to safely mobilize and complete ADL/IADL, discharge recommendation updated to reflect increased need for therapy and assist.       If plan is discharge home, recommend the following:  A little help with bathing/dressing/bathroom;A little help with walking and/or transfers;Assistance with cooking/housework;Assist for transportation;Help with stairs or ramp for entrance   Equipment Recommendations  BSC/3in1    Recommendations for Other Services      Precautions / Restrictions Precautions Precautions: Fall Recall of Precautions/Restrictions: Intact Precaution/Restrictions Comments: orthostatic - careful! Restrictions Weight Bearing Restrictions Per Provider Order: No       Mobility Bed Mobility               General bed mobility  comments: declined 2/2 fatigue    Transfers                         Balance                                           ADL either performed or assessed with clinical judgement   ADL                                              Extremity/Trunk Assessment              Vision       Perception     Praxis     Communication Communication Communication: No apparent difficulties   Cognition Arousal: Alert Behavior During Therapy: WFL for tasks assessed/performed Cognition: No apparent impairments             OT - Cognition Comments: Pt endorses getting confused overnight but no cognitive impairments noted during session                 Following commands: Intact        Cueing   Cueing Techniques: Verbal cues  Exercises Other Exercises Other Exercises: Pt instructed in falls prevention, home/routines modifications, DME placement and body positioning to support safe pivot/scoot transfers Other Exercises: Pt endorses difficulty with medication management following previous discharge from facility. OT facilitated problem solving and educated pt in medication mgt strategies to improve safety and adherence    Shoulder Instructions  General Comments      Pertinent Vitals/ Pain       Pain Assessment Pain Assessment: No/denies pain  Home Living                                          Prior Functioning/Environment              Frequency  Min 2X/week        Progress Toward Goals  OT Goals(current goals can now be found in the care plan section)  Progress towards OT goals: OT to reassess next treatment;Not progressing toward goals - comment (continued orthostatic hypotension)  Acute Rehab OT Goals Patient Stated Goal: go home OT Goal Formulation: With patient Time For Goal Achievement: 12/15/23 Potential to Achieve Goals: Good  Plan      Co-evaluation                  AM-PAC OT 6 Clicks Daily Activity     Outcome Measure   Help from another person eating meals?: None Help from another person taking care of personal grooming?: None Help from another person toileting, which includes using toliet, bedpan, or urinal?: A Little Help from another person bathing (including washing, rinsing, drying)?: A Little Help from another person to put on and taking off regular upper body clothing?: None Help from another person to put on and taking off regular lower body clothing?: A Little 6 Click Score: 21    End of Session    OT Visit Diagnosis: Other abnormalities of gait and mobility (R26.89)   Activity Tolerance Patient limited by fatigue   Patient Left in bed;with call bell/phone within reach;with bed alarm set;with family/visitor present   Nurse Communication          Time: 8563-8545 OT Time Calculation (min): 18 min  Charges: OT General Charges $OT Visit: 1 Visit OT Treatments $Therapeutic Activity: 8-22 mins  Warren SAUNDERS., MPH, MS, OTR/L ascom [phone] 12/04/23, 3:32 PM

## 2023-12-04 NOTE — Progress Notes (Signed)
 Physical Therapy Treatment Patient Details Name: Diana Cook MRN: [id number] DOB: October 31, 1950 Today's Date: 12/04/2023   History of Present Illness 73 y.o. female with PMHx of CAD, carotid artery stenosis s/p right carotid stent, Dorette complete heart block s/p permanent pacemaker, PAF on anticoagulation, seizure disorder, COPD on chronic 4 L, right breast cancer, and known history of severe refractory orthostatic hypotension who is seen in the emergency room with chief complaint of recurrent syncope.    PT Comments  Pt was seated in recliner upon arrival. She remains A and O x 4 and extremely pleasant. Abdominal binder in place but pot currently refuses TEDs. Was agreeable to allowing author to wrap BLEs in ace wraps prior to standing activity attempts. BP while in short sitting prior to standing 104/40 (59). No symptoms. Stood to [employer]. Tolerated ~ 30 sec prior to having altered mental status and needing to sit to prevent syncopal episode. BP once able to get reading 74/54. Author elevated BLEs in recliner with BP recovering 115/65 (81).  Due to pt's poor response to activity. DC recs updated to reflect pt's post acute needs. Pt will benefit from continued skilled PT at [facility] to maximize independence and safety with all ADLs.    If plan is discharge home, recommend the following: A little help with walking and/or transfers;A little help with bathing/dressing/bathroom;Assist for transportation;Help with stairs or ramp for entrance     Equipment Recommendations  Other (comment) (Defer to next level of care)       Precautions / Restrictions Precautions Precautions: Fall Recall of Precautions/Restrictions: Intact Precaution/Restrictions Comments: orthostatic - careful! Restrictions Weight Bearing Restrictions Per Provider Order: No     Mobility  Bed Mobility  General bed mobility comments: In recliner pre/post session    Transfers Overall transfer level: Needs assistance Equipment used:  Rolling walker (2 wheels) Transfers: Sit to/from Stand Sit to Stand: Min assist, Contact guard assist  General transfer comment: Pt had abdominal binder donned. ACE wraps applied to BLEs 2/2 to pt's unwillingness to wear TEDs 2/2 to discomfort/skin. BP while short sitting prior to standing 104/40 (59), pt was able to stand for ~ 30 sec prior to needing to sit due to severity of dizziness. pt has altered metal status but quickly returns to pre transfer cognition after sitting and BLE elevated. By the time BP reading reads 74/54. quickly recovers to once seasted with next BP reading 1115/65(81)    Ambulation/Gait  General Gait Details: Unable due to BP concerns    Balance Overall balance assessment: Needs assistance Sitting-balance support: No upper extremity supported Sitting balance-Leahy Scale: Good     Standing balance support: Bilateral upper extremity supported, During functional activity, Reliant on assistive device for balance Standing balance-Leahy Scale: Poor Standing balance comment: remains high fall risk due to hypotensive response to change in positions       Communication Communication Communication: No apparent difficulties  Cognition Arousal: Alert Behavior During Therapy: WFL for tasks assessed/performed   PT - Cognitive impairments: No apparent impairments    PT - Cognition Comments: pt remains A and O x 4. extremely pleasant and motivated however remains orthostatic hypotensive Following commands: Intact      Cueing Cueing Techniques: Verbal cues         Pertinent Vitals/Pain Pain Assessment Pain Assessment: No/denies pain     PT Goals (current goals can now be found in the care plan section) Acute Rehab PT Goals Patient Stated Goal: to return home Progress towards PT goals: Progressing  toward goals    Frequency    Min 2X/week       AM-PAC PT 6 Clicks Mobility   Outcome Measure  Help needed turning from your back to your side while in a  flat bed without using bedrails?: None Help needed moving from lying on your back to sitting on the side of a flat bed without using bedrails?: None Help needed moving to and from a bed to a chair (including a wheelchair)?: None Help needed standing up from a chair using your arms (e.g., wheelchair or bedside chair)?: A Little Help needed to walk in hospital room?: A Lot Help needed climbing 3-5 steps with a railing? : A Lot 6 Click Score: 19    End of Session   Activity Tolerance: Treatment limited secondary to medical complications (Comment) (Remains orthostatic hypotensive) Patient left: in chair;with call bell/phone within reach;with chair alarm set Nurse Communication: Mobility status PT Visit Diagnosis: Muscle weakness (generalized) (M62.81)     Time: 8883-8868 PT Time Calculation (min) (ACUTE ONLY): 15 min  Charges:    $Therapeutic Activity: 8-22 mins PT General Charges $$ ACUTE PT VISIT: 1 Visit                    Rankin Essex PTA 12/04/23, 1:23 PM

## 2023-12-05 DIAGNOSIS — I951 Orthostatic hypotension: Secondary | ICD-10-CM | POA: Diagnosis not present

## 2023-12-05 DIAGNOSIS — Z8669 Personal history of other diseases of the nervous system and sense organs: Secondary | ICD-10-CM | POA: Diagnosis not present

## 2023-12-05 DIAGNOSIS — R55 Syncope and collapse: Secondary | ICD-10-CM | POA: Diagnosis not present

## 2023-12-05 DIAGNOSIS — R9089 Other abnormal findings on diagnostic imaging of central nervous system: Secondary | ICD-10-CM | POA: Diagnosis not present

## 2023-12-05 LAB — BASIC METABOLIC PANEL WITH GFR
Anion gap: 8 (ref 5–15)
BUN: 16 mg/dL (ref 8–23)
CO2: 27 mmol/L (ref 22–32)
Calcium: 8.9 mg/dL (ref 8.9–10.3)
Chloride: 102 mmol/L (ref 98–111)
Creatinine, Ser: 0.78 mg/dL (ref 0.44–1.00)
GFR, Estimated: >60 mL/min (ref >60)
Glucose, Bld: 101 mg/dL — ABNORMAL HIGH (ref 70–99)
Potassium: 3 mmol/L — ABNORMAL LOW (ref 3.5–5.1)
Sodium: 137 mmol/L (ref 135–145)

## 2023-12-05 NOTE — Hospital Course (Signed)
 [name] is a 73 year old female with history of seizure, hyperlipidemia, CAD, hypertension, orthostasis, complete heart block status post pacemaker placement, recurrent syncope, weight loss, who presents to ED for chief concerns of syncope.   11/30/2023: Patient was admitted for syncope, recurrent and orthostatic hypotension.  Florinef , midodrine , Mestinon , abdominal binder, thigh-high TED hose.  PT evaluation, fall precautions.  12/01/2023: Patient remains to have orthostatic hypotension.  Cardiology consider addition of Northera.  12/02/2023: Northera/droxidopa 100 mg 3 times daily with meals initiated  12/03/2023: MRI brain w wo cm: 2.5 cm region of nonenhancing T2 signal abnormality in the inferior left occipital lobe, indeterminate but could reflect focal cortical dysplasia, low-grade glioma or likely encephalitis.  12/04/2023: Neurology has been consulted recommend checking acetylcholine receptor binding.  12/05/2023: Acetylcholine receptor binding ordered.  Pending TOC for SNF placement as PT recommends SNF, with PT.  12/06/2023: Noted to have hypokalemia.  Potassium chloride  40 mEq p.o., 2 doses ordered.  Recheck BMP in AM.  Check magnesium  level.  Likely will have twin like bed tomorrow morning.  Anticipate discharge on 12/07/2023  12/07/2023: Patient woke up and felt fine this morning.  She had no acute issues overnight.  She needed to have a bowel movement and was waiting to get dressed.  She did not have her abdominal binder in place and while on the commode she felt dizzy.  She did not pass out.  She reports that this is how she normally feels whenever she gets these episodes and especially when she does not have her abdominal binder in place.  Counseled patient at bedside to have help to put on abdominal binder at 8 AM in the morning every day.  Patient endorses understanding and compliance.

## 2023-12-05 NOTE — Progress Notes (Deleted)
 PROGRESS NOTE    Diana Cook  FMW:[id number] DOB: Aug 02, 1950 DOA: 11/30/2023 PCP: Randeen Laine LABOR, MD   [name] is a 73 year old female with history of seizure, hyperlipidemia, CAD, hypertension, orthostasis, complete heart block status post pacemaker placement, recurrent syncope, weight loss, who presents to ED for chief concerns of syncope.   11/30/2023: Patient was admitted for syncope, recurrent and orthostatic hypotension.  Florinef , midodrine , Mestinon , abdominal binder, thigh-high TED hose.  PT evaluation, fall precautions.  12/01/2023: Patient remains to have orthostatic hypotension.  Cardiology consider addition of Northera.  12/02/2023: Northera/droxidopa 100 mg 3 times daily with meals initiated  12/03/2023: MRI brain w wo cm: 2.5 cm region of nonenhancing T2 signal abnormality in the inferior left occipital lobe, indeterminate but could reflect focal cortical dysplasia, low-grade glioma or likely encephalitis.  12/04/2023: Neurology has been consulted recommend checking acetylcholine receptor binding.  12/05/2023: Acetylcholine receptor binding ordered.  Pending TOC for SNF placement as PT recommends SNF, with PT.  Assessment & Plan:   Principal Problem:   Recurrent syncope Active Problems:   Orthostasis   Complete heart block s/p pacemaker (HCC)   Seizures (HCC)   Hyperlipidemia   Coronary artery disease of native artery of native heart with stable angina pectoris   Weakness   Weight loss   Hypotension   Assessment and Plan:  * Recurrent syncope With new onset headaches and patient endorses weight loss Workup in progress including neoplasm MRI of the brain with and without contrast ordered Fall precaution, PT, OT  12/04/2023: Overnight it was reported that patient had a syncopal event.  At bedside, patient is awake alert and oriented x 3.  Patient reports that she was trying to get to the bedside commode when she suddenly felt dizzy and the next thing she  knew she was on the floor.  She did not remember having control of falling or turning back to the bed so that she could prevent a fall.  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  Neurology was consulted who states the syncopal events are unlikely to be glioma related.  Likely seizure related.  Neurology consulted for possible seizure medication modification and management.  Seizure precaution in place.  Patient and her spouse Mr. Rheya Minogue was updated over the phone via speaker well at bedside with the patient.  Seizures (HCC) Resume home Keppra  250 mg p.o. twice daily  Hyperlipidemia Home atorvastatin  40 mg daily  Hypotension Continue with home midodrine  10 mg 3 times daily with meals, fludrocortisone  0.1 mg p.o. twice daily, pyridostigmine  60 mg 3 times daily  DVT prophylaxis: Apixaban  Code Status: DNI DNR Family Communication: No Disposition Plan: Pending SNF placement Level of care: Telemetry Medical  Consultants:  Neurology, PT, OT, TOC  Procedures:  None at this time  Antimicrobials: Not indicated  Subjective:  At bedside, patient does not appear to be in acute distress.  She is awake alert and oriented x 3.  She reports no passing out, dizziness, loss of consciousness last night.  She reports she is drinking a lot of water.  She denies chest pain, shortness of breath, dysuria, hematuria, nausea, vomiting, diarrhea.  Objective: Vitals:   12/04/23 1418 12/04/23 1700 12/04/23 2013 12/05/23 0753  BP: 118/60 (!) 135/53 (!) 181/79 (!) 150/68  Pulse: 77 79 77 72  Resp:  17 18 16   Temp:  98.4 F (36.9 C) 99.3 F (37.4 C) 98 F (  36.7 C)  TempSrc:  Oral Oral Oral  SpO2:  97% 91% 94%  Weight:      Height:        Intake/Output Summary (Last 24 hours) at 12/05/2023 1423 Last data filed at 12/05/2023 1326 Gross per 24 hour  Intake 200 ml  Output 350 ml  Net -150 ml   Filed Weights    11/30/23 0915  Weight: 81.6 kg   Examination:  General exam: Appears calm and comfortable  Respiratory system: Clear to auscultation. Respiratory effort normal. Cardiovascular system: S1 & S2 heard, RRR. No JVD, murmurs, rubs, gallops or clicks. No pedal edema. Gastrointestinal system: Abdomen is nondistended, soft and nontender. No organomegaly or masses felt. Normal bowel sounds heard. Central nervous system: Alert and oriented. No focal neurological deficits. Extremities: Symmetric 5 x 5 power. Skin: No rashes, lesions or ulcers Psychiatry: Judgement and insight appear normal. Mood & affect appropriate.   Data Reviewed: I have personally reviewed following labs and imaging studies  CBC: Recent Labs  Lab 11/30/23 0926 12/01/23 0421 12/03/23 1243 12/04/23 0455  WBC 6.5 8.1 10.7* 7.7  HGB 13.6 11.9* 12.9 11.6*  HCT 43.9 37.4 40.2 36.9  MCV 84.6 82.9 83.2 83.5  PLT 338 300 289 291   Basic Metabolic Panel: Recent Labs  Lab 11/30/23 0926 12/01/23 0421 12/03/23 1243 12/04/23 1545 12/05/23 0552  NA 137 135 136 133* 137  K 5.6* 3.5 3.5 3.3* 3.0*  CL 101 102 101 98 102  CO2 24 24 26 25 27   GLUCOSE 116* 101* 99 105* 101*  BUN 21 22 13 20 16   CREATININE 1.01* 0.63 0.76 0.98 0.78  CALCIUM  9.8 9.1 9.0 8.9 8.9   GFR: Estimated Creatinine Clearance: 69.4 mL/min (by C-G formula based on SCr of 0.78 mg/dL).  Radiology Studies: MR BRAIN W WO CONTRAST Result Date: 12/03/2023 CLINICAL DATA:  Headache, increasing frequency or severity Headache, new onset (Age >= 51y). Recurrent weakness, hypotension, dizziness, and weight loss. EXAM: MRI HEAD WITHOUT AND WITH CONTRAST TECHNIQUE: Multiplanar, multiecho pulse sequences of the brain and surrounding structures were obtained without and with intravenous contrast. CONTRAST:  8mL GADAVIST GADOBUTROL 1 MMOL/ML IV SOLN COMPARISON:  Head CT 09/14/2023 FINDINGS: Brain: There is no evidence of an acute infarct, midline shift, hydrocephalus, or  extra-axial fluid collection. A few scattered chronic microhemorrhages are noted in the cerebral hemispheres and brainstem, nonspecific. Patchy T2 hyperintensities in the cerebral white matter bilaterally are nonspecific but compatible with moderate chronic small vessel ischemic disease. There is an approximately 2.5 cm region of mild hazy T2 FLAIR hyperintensity in the inferior left occipital lobe involving cortex and juxtacortical white matter with blurring of the gray-white junction, possible slight gyral expansion, and no associated enhancement or diffusion abnormality (series 9, image 19 and series 15, image 7). Mild cerebral atrophy is within normal limits for age. No abnormal intracranial enhancement is identified. Vascular: Major intracranial vascular flow voids are preserved. Skull and upper cervical spine: Unremarkable bone marrow signal. Sinuses/Orbits: Bilateral cataract extraction. Paranasal sinuses and mastoid air cells are clear. Other: None. IMPRESSION: 1. 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 Short-term MRI follow-up is recommended. 2. Moderate chronic small vessel ischemic disease. Electronically Signed   By: Dasie Hamburg M.D.   On: 12/03/2023 20:20   Scheduled Meds:  apixaban   5 mg Oral BID   aspirin  EC  81 mg Oral  Daily   atorvastatin   40 mg Oral Daily   droxidopa  100 mg Oral TID WC   fludrocortisone   0.1 mg Oral BID   FLUoxetine   20 mg Oral Daily   iron  polysaccharides  150 mg Oral Daily   levETIRAcetam   250 mg Oral BID   midodrine   10 mg Oral TID WC   polyethylene glycol  17 g Oral Daily   pyridostigmine   60 mg Oral TID   senna-docusate  1 tablet Oral BID   sodium chloride   1 g Oral BID WC   Continuous Infusions: none   LOS: 4 days   Time spent: 46  Dr. Sherre [facility] If 7PM-7AM, please contact  night-coverage  12/05/2023, 2:23 PM

## 2023-12-05 NOTE — Progress Notes (Signed)
 NEUROLOGY CONSULT FOLLOW UP NOTE   Date of service: December 05, 2023 Patient Name: Diana Cook MRN:  [id number] DOB:  06/28/1950  Interval Hx/subjective   No changes, some constipation, but not severe, some urinary dribbling but know known retention or spasms Vitals   Vitals:   12/04/23 1700 12/04/23 2013 12/05/23 0753 12/05/23 1844  BP: (!) 135/53 (!) 181/79 (!) 150/68 (!) 154/71  Pulse: 79 77 72 69  Resp: 17 18 16 16   Temp: 98.4 F (36.9 C) 99.3 F (37.4 C) 98 F (36.7 C) 98.3 F (36.8 C)  TempSrc: Oral Oral Oral Oral  SpO2: 97% 91% 94% 93%  Weight:      Height:         Body mass index is 26.58 kg/m.  Physical Exam   Constitutional: Appears well-developed and well-nourished.  Neurologic Examination    MS: in bed, NAD CN: asymmetric pupils, both are reactive Motor: good strength throughout Sensory: decreased to temp to the mid-calf  Medications  Current Facility-Administered Medications:    acetaminophen  (TYLENOL ) tablet 650 mg, 650 mg, Oral, Q6H PRN, 650 mg at 12/04/23 2153 **OR** acetaminophen  (TYLENOL ) suppository 650 mg, 650 mg, Rectal, Q6H PRN, Sreenath, Sudheer B, MD   apixaban  (ELIQUIS ) tablet 5 mg, 5 mg, Oral, BID, Quale, Mark, MD, 5 mg at 12/05/23 0915   aspirin  EC tablet 81 mg, 81 mg, Oral, Daily, Sreenath, Sudheer B, MD, 81 mg at 12/05/23 9085   atorvastatin  (LIPITOR) tablet 40 mg, 40 mg, Oral, Daily, Sreenath, Sudheer B, MD, 40 mg at 12/05/23 9085   benzonatate  (TESSALON ) capsule 200 mg, 200 mg, Oral, BID PRN, Jhonny, Sudheer B, MD   bisacodyl  (DULCOLAX) suppository 10 mg, 10 mg, Rectal, Daily PRN, Jhonny, Sudheer B, MD, 10 mg at 12/01/23 0530   droxidopa (NORTHERA) capsule 100 mg, 100 mg, Oral, TID WC, Gollan, Timothy J, MD, 100 mg at 12/05/23 8161   fludrocortisone  (FLORINEF ) tablet 0.1 mg, 0.1 mg, Oral, BID, Arida, Muhammad A, MD, 0.1 mg at 12/05/23 9085   FLUoxetine  (PROZAC ) capsule 20 mg, 20 mg, Oral, Daily, Sreenath, Sudheer B, MD, 20 mg at  12/05/23 9085   ipratropium-albuterol  (DUONEB) 0.5-2.5 (3) MG/3ML nebulizer solution 3 mL, 3 mL, Nebulization, Q6H PRN, Sreenath, Sudheer B, MD   iron  polysaccharides (NIFEREX) capsule 150 mg, 150 mg, Oral, Daily, Sreenath, Sudheer B, MD, 150 mg at 12/05/23 9085   levETIRAcetam  (KEPPRA ) tablet 250 mg, 250 mg, Oral, BID, Quale, Mark, MD, 250 mg at 12/05/23 9085   midodrine  (PROAMATINE ) tablet 10 mg, 10 mg, Oral, TID WC, Quale, Mark, MD, 10 mg at 12/05/23 8161   ondansetron  (ZOFRAN ) tablet 4 mg, 4 mg, Oral, Q6H PRN **OR** ondansetron  (ZOFRAN ) injection 4 mg, 4 mg, Intravenous, Q6H PRN, Jhonny, Sudheer B, MD, 4 mg at 12/04/23 0856   oxyCODONE  (Oxy IR/ROXICODONE ) immediate release tablet 5 mg, 5 mg, Oral, Q4H PRN, Sreenath, Sudheer B, MD   polyethylene glycol (MIRALAX  / GLYCOLAX ) packet 17 g, 17 g, Oral, Daily, Sreenath, Sudheer B, MD, 17 g at 12/05/23 0914   pyridostigmine  (MESTINON ) tablet 60 mg, 60 mg, Oral, TID, Quale, Mark, MD, 60 mg at 12/05/23 1543   senna-docusate (Senokot-S) tablet 1 tablet, 1 tablet, Oral, BID, Sreenath, Sudheer B, MD, 1 tablet at 12/05/23 0915   sodium chloride  tablet 1 g, 1 g, Oral, BID WC, Gollan, Timothy J, MD, 1 g at 12/05/23 8161   zolpidem  (AMBIEN ) tablet 5 mg, 5 mg, Oral, QHS PRN, Jhonny, Sudheer B, MD, 5 mg at  12/04/23 2157  Labs and Diagnostic Imaging   Assessment   Diana Cook is a 73 y.o. female with a history of a seizure disorder who presents with syncope related to severe orthostasis.  The rapid progression of her orthostasis is highly concerning, and I would be quite concerned about multiple system atrophy or pure autonomic failure.  She does not have any other findings characteristic of small fiber neuropathy(e.g pain). I think that she would benefit from a referral to an autonomic specialist if she is not seeing one already.   Her MRI finding is of unclear significance, but could represent a seizure focus if it is cortical dysplasia. Low grade glioma  is also possible, and I would favor repeating the MRI in a few months to ensure stability. No clinical signs to suggest cerebritis.    Recommendations  Continue treating orthostasis Would likely benefit from EMG and/or QSART as outpatient Send dysautonomia panel to [facility], test code DYS2(ordered), this includes the ganglionic nicotinic acetylcholine receptor antibodies.  This can be followed up as outpatient.  Continue home keppra  Neurology will be available as needed, please call with further questions or concerns.  ______________________________________________________________________   Diana Aisha Seals, MD [facility]

## 2023-12-05 NOTE — Plan of Care (Signed)

## 2023-12-05 NOTE — Assessment & Plan Note (Signed)
 Ambien

## 2023-12-05 NOTE — Progress Notes (Signed)
 PROGRESS NOTE    Diana Cook  FMW:[id number] DOB: 14-May-1950 DOA: 11/30/2023 PCP: Randeen Laine LABOR, MD   Diana Cook is a 73 year old female with history of seizure, hyperlipidemia, CAD, hypertension, orthostasis, complete heart block status post pacemaker placement, recurrent syncope, weight loss, who presents to ED for chief concerns of syncope.   11/30/2023: Diana was admitted for syncope, recurrent and orthostatic hypotension.  Florinef , midodrine , Mestinon , abdominal binder, thigh-high TED hose.  PT evaluation, fall precautions.  12/01/2023: Diana remains to have orthostatic hypotension.  Cardiology consider addition of Northera.  12/02/2023: Northera/droxidopa 100 mg 3 times daily with meals initiated  12/03/2023: MRI brain w wo cm: 2.5 cm region of nonenhancing T2 signal abnormality in the inferior left occipital lobe, indeterminate but could reflect focal cortical dysplasia, low-grade glioma or likely encephalitis.  12/04/2023: Neurology has been consulted recommend checking acetylcholine receptor binding.  12/05/2023: Acetylcholine receptor binding ordered.  Pending TOC for SNF placement as PT recommends SNF, with PT.  Assessment & Plan:   Principal Problem:   Recurrent syncope Active Problems:   Orthostasis   Complete heart block s/p pacemaker (HCC)   Seizures (HCC)   Hyperlipidemia   Coronary artery disease of native artery of native heart with stable angina pectoris   Insomnia   Weakness   Weight loss   Hypotension   Assessment and Plan:  * Recurrent syncope With new onset headaches and Diana endorses weight loss Workup in progress including neoplasm MRI of the brain with and without contrast ordered Fall precaution, PT, OT  12/04/2023: Overnight it was reported that Diana had a syncopal event.  At bedside, Diana is awake alert and oriented x 3.  Diana reports that she was trying to get to the bedside commode when she suddenly felt dizzy and the  next thing she knew she was on the floor.  She did not remember having control of falling or turning back to the bed so that she could prevent a fall.  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 Neurology was consulted who states the syncopal events are unlikely to be glioma related. Appreciate neurology recommendation.  Seizure precaution in place.  Diana Cook was updated over the phone via speaker well at bedside with the Diana.  Seizures (HCC) Resume home Keppra  250 mg p.o. twice daily  Hyperlipidemia Home atorvastatin  40 mg daily  Hypotension Continue with home midodrine  10 mg 3 times daily with meals, fludrocortisone  0.1 mg p.o. twice daily, pyridostigmine  60 mg 3 times daily  Insomnia Ambien   DVT prophylaxis: Apixaban  Code Status: DNR/DNI Family Communication: No Disposition Plan: Pending TOC for SNF placement Level of care: Telemetry Medical  Consultants:  Neurology, PT, TOC, OT  Procedures:  None at this time  Antimicrobials: None indicated  Subjective:  At bedside, Diana was awake alert and oriented x 3.  She does not appear to be in acute distress.  She reports no syncopal events, collapse last night.  She denies chest pain, shortness of breath.  She has no acute complaints at this time.  States she is drinking a lot of water however does not understand why her urine is still dark.  Objective: Vitals:   12/04/23 1418 12/04/23 1700 12/04/23 2013 12/05/23 0753  BP: 118/60 (!) 135/53 (!) 181/79 (!) 150/68  Pulse: 77 79 77 72  Resp:  17 18 16   Temp:  98.4 F (36.9 C) 99.3  F (37.4 C) 98 F (36.7 C)  TempSrc:  Oral Oral Oral  SpO2:  97% 91% 94%  Weight:      Height:        Intake/Output Summary (Last 24 hours) at 12/05/2023 1540 Last data filed at 12/05/2023 1326 Gross per 24 hour  Intake 440 ml  Output 350 ml  Net 90 ml   Filed Weights   11/30/23  0915  Weight: 81.6 kg   Examination:  General exam: Appears calm and comfortable  Respiratory system: Nasal cannula in place.  Clear to auscultation. Respiratory effort normal. Cardiovascular system: S1 & S2 heard, RRR. No JVD, murmurs, rubs, gallops or clicks. No pedal edema. Gastrointestinal system: Abdomen is nondistended, soft and nontender. No organomegaly or masses felt. Normal bowel sounds heard. Central nervous system: Alert and oriented. No focal neurological deficits. Extremities: Symmetric 5 x 5 power. Skin: No rashes, lesions or ulcers Psychiatry: Judgement and insight appear normal. Mood & affect appropriate.   Data Reviewed: I have personally reviewed following labs and imaging studies  CBC: Recent Labs  Lab 11/30/23 0926 12/01/23 0421 12/03/23 1243 12/04/23 0455  WBC 6.5 8.1 10.7* 7.7  HGB 13.6 11.9* 12.9 11.6*  HCT 43.9 37.4 40.2 36.9  MCV 84.6 82.9 83.2 83.5  PLT 338 300 289 291   Basic Metabolic Panel: Recent Labs  Lab 11/30/23 0926 12/01/23 0421 12/03/23 1243 12/04/23 1545 12/05/23 0552  NA 137 135 136 133* 137  K 5.6* 3.5 3.5 3.3* 3.0*  CL 101 102 101 98 102  CO2 24 24 26 25 27   GLUCOSE 116* 101* 99 105* 101*  BUN 21 22 13 20 16   CREATININE 1.01* 0.63 0.76 0.98 0.78  CALCIUM  9.8 9.1 9.0 8.9 8.9   GFR: Estimated Creatinine Clearance: 69.4 mL/min (by C-G formula based on SCr of 0.78 mg/dL).  No results found for this or any previous visit (from the past 240 hours).   Radiology Studies: No results found.  Scheduled Meds:  apixaban   5 mg Oral BID   aspirin  EC  81 mg Oral Daily   atorvastatin   40 mg Oral Daily   droxidopa  100 mg Oral TID WC   fludrocortisone   0.1 mg Oral BID   FLUoxetine   20 mg Oral Daily   iron  polysaccharides  150 mg Oral Daily   levETIRAcetam   250 mg Oral BID   midodrine   10 mg Oral TID WC   polyethylene glycol  17 g Oral Daily   pyridostigmine   60 mg Oral TID   senna-docusate  1 tablet Oral BID   sodium chloride    1 g Oral BID WC   Continuous Infusions:None at this time   LOS: 4 days  Time spent: 35 minutes  Dr. Sherre [facility]  If 7PM-7AM, please contact night-coverage  12/05/2023, 3:40 PM

## 2023-12-06 DIAGNOSIS — R55 Syncope and collapse: Secondary | ICD-10-CM | POA: Diagnosis not present

## 2023-12-06 LAB — MAGNESIUM: Magnesium: 2 mg/dL (ref 1.7–2.4)

## 2023-12-06 MED ORDER — POTASSIUM CHLORIDE CRYS ER 20 MEQ PO TBCR
40.0000 meq | EXTENDED_RELEASE_TABLET | Freq: Two times a day (BID) | ORAL | Status: AC
  Administered 2023-12-06 (×2): 40 meq via ORAL
  Filled 2023-12-06 (×2): qty 2

## 2023-12-06 NOTE — Progress Notes (Signed)
 Mobility Specialist - Progress Note   12/06/23 1000  Mobility  Activity Stood at bedside;Pivoted/transferred from bed to chair  Level of Assistance Contact guard assist, steadying assist  Assistive Device Front wheel walker  Range of Motion/Exercises Active;All extremities  Activity Response Tolerated well  Mobility visit 1 Mobility     Pt lying in bed upon arrival, utilizing RA. O2 doffed upon arrival as pt had just finished completing bathing tasks---reapplied on 3L. Assistance needed to don TED hose and abdominal binder. Completed bed mobility independently. STS and transfer to recliner with CGA. No LOB. Pt left in chair with alarm set, needs in reach.    Lennette Seip Mobility Specialist 12/06/23, 11:26 AM

## 2023-12-06 NOTE — Assessment & Plan Note (Signed)
 Not in acute decompensation at this time Home aspirin  81 mg daily, atorvastatin  40 mg daily continued

## 2023-12-06 NOTE — Progress Notes (Signed)
 PROGRESS NOTE    LALISA KIEHN  FMW:[id number] DOB: 06-May-1950 DOA: 11/30/2023 PCP: Randeen Laine LABOR, MD   [name] is a 73 year old female with history of seizure, hyperlipidemia, CAD, hypertension, orthostasis, complete heart block status post pacemaker placement, recurrent syncope, weight loss, who presents to ED for chief concerns of syncope.   11/30/2023: Patient was admitted for syncope, recurrent and orthostatic hypotension.  Florinef , midodrine , Mestinon , abdominal binder, thigh-high TED hose.  PT evaluation, fall precautions.  12/01/2023: Patient remains to have orthostatic hypotension.  Cardiology consider addition of Northera.  12/02/2023: Northera/droxidopa 100 mg 3 times daily with meals initiated  12/03/2023: MRI brain w wo cm: 2.5 cm region of nonenhancing T2 signal abnormality in the inferior left occipital lobe, indeterminate but could reflect focal cortical dysplasia, low-grade glioma or likely encephalitis.  12/04/2023: Neurology has been consulted recommend checking acetylcholine receptor binding.  12/05/2023: Acetylcholine receptor binding ordered.  Pending TOC for SNF placement as PT recommends SNF, with PT.  12/06/2023: Noted to have hypokalemia.  Potassium chloride  40 mEq p.o., 2 doses ordered.  Recheck BMP in AM.  Check magnesium  level.  Likely will have twin like bed tomorrow morning.  Anticipate discharge on 12/07/2023  Assessment & Plan:   Principal Problem:   Recurrent syncope Active Problems:   Orthostasis   Complete heart block s/p pacemaker (HCC)   Seizures (HCC)   Hypokalemia   Hyperlipidemia   Coronary artery disease of native artery of native heart with stable angina pectoris   Insomnia   Weakness   Weight loss   Hypotension   Assessment and Plan:  * Recurrent syncope With new onset headaches and patient endorses weight loss Workup in progress including neoplasm MRI of the brain with and without contrast ordered Fall precaution, PT,  OT  12/04/2023: Overnight it was reported that patient had a syncopal event.  At bedside, patient is awake alert and oriented x 3.  Patient reports that she was trying to get to the bedside commode when she suddenly felt dizzy and the next thing she knew she was on the floor.  She did not remember having control of falling or turning back to the bed so that she could prevent a fall.  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 Neurology was consulted who states the syncopal events are unlikely to be glioma related. Appreciate neurology recommendation.  Seizure precaution in place.  Patient and her spouse Mr. Anivea Velasques was updated over the phone via speaker well at bedside with the patient.  Hypokalemia Potassium chloride  40 mEq p.o., 2 doses ordered Check magnesium  level and replace further as appropriate Recheck BMP in a.m.  Seizures (HCC) Continue home Keppra  250 mg p.o. twice daily, seizure precaution  Coronary artery disease of native artery of native heart with stable angina pectoris Not in acute decompensation at this time Home aspirin  81 mg daily, atorvastatin  40 mg daily continued  Hyperlipidemia Home atorvastatin  40 mg daily  Hypotension Continue with home midodrine  10 mg 3 times daily with meals, fludrocortisone  0.1 mg p.o. twice daily, pyridostigmine  60 mg 3 times daily  Insomnia Ambien   DVT prophylaxis: Apixaban  Code Status: DNR/DNI Family Communication: No, patient updates her husband Disposition Plan: Pending clinical course Level of care: Telemetry Medical  Consultants:  Neurology, cardiology, PT, OT, TOC  Procedures:  None indicated  Antimicrobials: None indicated  Subjective:  At bedside, patient is able to tell me her first  and last name, age and location, current calendar year.  She does not appear to be in acute distress.  She reports no syncopal episodes or dizziness last  evening.  She reports she feels well.  Objective: Vitals:   12/05/23 1948 12/06/23 0315 12/06/23 0800 12/06/23 1200  BP: (!) 171/72 (!) 140/65 (!) 143/78 (!) 167/79  Pulse: 72 63 71 61  Resp: 16 16 20  (!) 27  Temp: (!) 97.5 F (36.4 C) 98 F (36.7 C) 98.3 F (36.8 C) 98 F (36.7 C)  TempSrc:  Oral Oral Oral  SpO2: 96% 93% 91% 94%  Weight:      Height:       Intake/Output Summary (Last 24 hours) at 12/06/2023 1347 Last data filed at 12/06/2023 0300 Gross per 24 hour  Intake 370 ml  Output 300 ml  Net 70 ml   Filed Weights   11/30/23 0915  Weight: 81.6 kg   Examination:  General exam: Appears calm and comfortable  Respiratory system: Clear to auscultation. Respiratory effort normal.  Nasal cannula in place. Cardiovascular system: S1 & S2 heard, RRR. No JVD, murmurs, rubs, gallops or clicks. No pedal edema. Gastrointestinal system: Abdomen is nondistended, soft and nontender. No organomegaly or masses felt. Normal bowel sounds heard. Central nervous system: Alert and oriented. No focal neurological deficits. Extremities: Symmetric 5 x 5 power. Skin: No rashes, lesions or ulcers Psychiatry: Judgement and insight appear normal. Mood & affect appropriate.   Data Reviewed: I have personally reviewed following labs and imaging studies  CBC: Recent Labs  Lab 11/30/23 0926 12/01/23 0421 12/03/23 1243 12/04/23 0455  WBC 6.5 8.1 10.7* 7.7  HGB 13.6 11.9* 12.9 11.6*  HCT 43.9 37.4 40.2 36.9  MCV 84.6 82.9 83.2 83.5  PLT 338 300 289 291   Basic Metabolic Panel: Recent Labs  Lab 11/30/23 0926 12/01/23 0421 12/03/23 1243 12/04/23 1545 12/05/23 0552  NA 137 135 136 133* 137  K 5.6* 3.5 3.5 3.3* 3.0*  CL 101 102 101 98 102  CO2 24 24 26 25 27   GLUCOSE 116* 101* 99 105* 101*  BUN 21 22 13 20 16   CREATININE 1.01* 0.63 0.76 0.98 0.78  CALCIUM  9.8 9.1 9.0 8.9 8.9   GFR: Estimated Creatinine Clearance: 69.4 mL/min (by C-G formula based on SCr of 0.78  mg/dL).  Scheduled Meds:  apixaban   5 mg Oral BID   aspirin  EC  81 mg Oral Daily   atorvastatin   40 mg Oral Daily   droxidopa  100 mg Oral TID WC   fludrocortisone   0.1 mg Oral BID   FLUoxetine   20 mg Oral Daily   iron  polysaccharides  150 mg Oral Daily   levETIRAcetam   250 mg Oral BID   midodrine   10 mg Oral TID WC   polyethylene glycol  17 g Oral Daily   potassium chloride   40 mEq Oral BID   pyridostigmine   60 mg Oral TID   senna-docusate  1 tablet Oral BID   sodium chloride   1 g Oral BID WC   Continuous Infusions: none indicated   LOS: 5 days   Time spent: 35 minutes  Dr. Sherre [facility] If 7PM-7AM, please contact night-coverage 12/06/2023, 1:47 PM

## 2023-12-06 NOTE — Assessment & Plan Note (Signed)
 Potassium chloride  40 mEq p.o., 2 doses ordered Check magnesium  level and replace further as appropriate Recheck BMP in a.m.

## 2023-12-07 ENCOUNTER — Other Ambulatory Visit (HOSPITAL_COMMUNITY): Payer: Self-pay

## 2023-12-07 ENCOUNTER — Telehealth (HOSPITAL_COMMUNITY): Payer: Self-pay | Admitting: Pharmacy Technician

## 2023-12-07 DIAGNOSIS — R55 Syncope and collapse: Secondary | ICD-10-CM | POA: Diagnosis not present

## 2023-12-07 LAB — BASIC METABOLIC PANEL WITH GFR
Anion gap: 9 (ref 5–15)
BUN: 15 mg/dL (ref 8–23)
CO2: 27 mmol/L (ref 22–32)
Calcium: 8.9 mg/dL (ref 8.9–10.3)
Chloride: 102 mmol/L (ref 98–111)
Creatinine, Ser: 0.7 mg/dL (ref 0.44–1.00)
GFR, Estimated: >60 mL/min (ref >60)
Glucose, Bld: 100 mg/dL — ABNORMAL HIGH (ref 70–99)
Potassium: 3.8 mmol/L (ref 3.5–5.1)
Sodium: 138 mmol/L (ref 135–145)

## 2023-12-07 LAB — CBC
HCT: 34.1 % — ABNORMAL LOW (ref 36.0–46.0)
Hemoglobin: 10.8 g/dL — ABNORMAL LOW (ref 12.0–15.0)
MCH: 26.2 pg (ref 26.0–34.0)
MCHC: 31.7 g/dL (ref 30.0–36.0)
MCV: 82.8 fL (ref 80.0–100.0)
Platelets: 330 K/uL (ref 150–400)
RBC: 4.12 MIL/uL (ref 3.87–5.11)
RDW: 15 % (ref 11.5–15.5)
WBC: 6.3 K/uL (ref 4.0–10.5)
nRBC: 0 % (ref 0.0–0.2)

## 2023-12-07 MED ORDER — SODIUM CHLORIDE 1 G PO TABS: 1.0000 g | ORAL_TABLET | Freq: Two times a day (BID) | ORAL | 1 refills | Status: DC

## 2023-12-07 MED ORDER — BISACODYL 10 MG RE SUPP: 10.0000 mg | Freq: Every day | RECTAL | 0 refills | Status: DC | PRN

## 2023-12-07 MED ORDER — DROXIDOPA 100 MG PO CAPS: 100.0000 mg | ORAL_CAPSULE | Freq: Three times a day (TID) | ORAL | 0 refills | Status: AC

## 2023-12-07 NOTE — Discharge Summary (Signed)
 Physician Discharge Summary   Patient: Diana Cook MRN: [id number] DOB: [birth year]  Admit date:     11/30/2023  Discharge date: 12/07/23  Discharge Physician: Dr. Sherre   PCP: Randeen Laine LABOR, MD   Recommendations at discharge:   Continue abdominal binder, droxidopa 100 mg 3 times daily with meals, midodrine  10 mg 3 times daily with meals, fludrocortisone  0.1 mg p.o. twice daily Abdominal binder to be placed on patient at 8 AM every day. Follow-up outpatient with neurology Follow-up outpatient with your PCP  Discharge Diagnoses: Principal Problem:   Recurrent syncope Active Problems:   Orthostasis   Complete heart block s/p pacemaker (HCC)   Seizures (HCC)   Hypokalemia   Hyperlipidemia   Coronary artery disease of native artery of native heart with stable angina pectoris   Insomnia   Weakness   Weight loss   Hypotension  Resolved Problems:   * No resolved hospital problems. [facility] Course: Diana Cook is a 73 year old female with history of seizure, hyperlipidemia, CAD, hypertension, orthostasis, complete heart block status post pacemaker placement, recurrent syncope, weight loss, who presents to ED for chief concerns of syncope.   11/30/2023: Patient was admitted for syncope, recurrent and orthostatic hypotension.  Florinef , midodrine , Mestinon , abdominal binder, thigh-high TED hose.  PT evaluation, fall precautions.  12/01/2023: Patient remains to have orthostatic hypotension.  Cardiology consider addition of Northera.  12/02/2023: Northera/droxidopa 100 mg 3 times daily with meals initiated  12/03/2023: MRI brain w wo cm: 2.5 cm region of nonenhancing T2 signal abnormality in the inferior left occipital lobe, indeterminate but could reflect focal cortical dysplasia, low-grade glioma or likely encephalitis.  12/04/2023: Neurology has been consulted recommend checking acetylcholine receptor binding.  12/05/2023: Acetylcholine receptor binding ordered.  Pending TOC  for SNF placement as PT recommends SNF, with PT.  12/06/2023: Noted to have hypokalemia.  Potassium chloride  40 mEq p.o., 2 doses ordered.  Recheck BMP in AM.  Check magnesium  level.  Likely will have twin like bed tomorrow morning.  Anticipate discharge on 12/07/2023  12/07/2023: Patient woke up and felt fine this morning.  She had no acute issues overnight.  She needed to have a bowel movement and was waiting to get dressed.  She did not have her abdominal binder in place and while on the commode she felt dizzy.  She did not pass out.  She reports that this is how she normally feels whenever she gets these episodes and especially when she does not have her abdominal binder in place.  Counseled patient at bedside to have help to put on abdominal binder at 8 AM in the morning every day.  Patient endorses understanding and compliance.  Assessment and Plan: * Recurrent syncope With new onset headaches and patient endorses weight loss Workup in progress including neoplasm MRI of the brain with and without contrast ordered Fall precaution, PT, OT  12/04/2023: Overnight it was reported that patient had a syncopal event.  At bedside, patient is awake alert and oriented x 3.  Patient reports that she was trying to get to the bedside commode when she suddenly felt dizzy and the next thing she knew she was on the floor.  She did not remember having control of falling or turning back to the bed so that she could prevent a fall.  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 Neurology was consulted who states the syncopal events are unlikely to  be glioma related. Appreciate neurology recommendation.  Seizure precaution in place.  Patient and her spouse [name] was updated over the phone via speaker well at bedside with the patient.  12/07/2023: No syncopal event in the last 48 hours.  I am hopeful that with continuation of  physical therapy, abdominal binder, and salt ups, compression stocks and, midodrine , Florinef , droxidopa that she will have mild/some improvement.  Hypokalemia Potassium chloride  40 mEq p.o., 2 doses ordered Check magnesium  level and replace further as appropriate Recheck BMP in a.m.  Seizures (HCC) Continue home Keppra  250 mg p.o. twice daily, seizure precaution  Coronary artery disease of native artery of native heart with stable angina pectoris Not in acute decompensation at this time Home aspirin  81 mg daily, atorvastatin  40 mg daily continued  Hyperlipidemia Home atorvastatin  40 mg daily  Hypotension Continue with home midodrine  10 mg 3 times daily with meals, fludrocortisone  0.1 mg p.o. twice daily, pyridostigmine  60 mg 3 times daily  Insomnia Ambien   Pain control - [place]  Controlled Substance Reporting System database was reviewed. and patient was instructed, not to drive, operate heavy machinery, perform activities at heights, swimming or participation in water activities or provide baby-sitting services while on Pain, Sleep and Anxiety Medications; until their outpatient Physician has advised to do so again. Also recommended to not to take more than prescribed Pain, Sleep and Anxiety Medications.  Consultants: Cardiology, neurology, PT, OT, TOC Procedures performed: None indicated Disposition: Skilled nursing facility Diet recommendation:  Regular diet DISCHARGE MEDICATION: Allergies as of 12/07/2023       Reactions   Codeine Nausea And Vomiting   Morphine  And Codeine Nausea Only        Medication List     STOP taking these medications    furosemide  40 MG tablet Commonly known as: LASIX    gabapentin  100 MG capsule Commonly known as: NEURONTIN        TAKE these medications    acetaminophen  500 MG tablet Commonly known as: TYLENOL  Take 500-1,000 mg by mouth every 6 (six) hours as needed for mild pain or headache.   albuterol  108 (90 Base)  MCG/ACT inhaler Commonly known as: VENTOLIN  HFA INHALE 2 PUFFS BY MOUTH EVERY 4 HOURS AS NEEDED FOR WHEEZING OR SHORTNESS OF BREATH   apixaban  5 MG Tabs tablet Commonly known as: Eliquis  Take 1 tablet (5 mg total) by mouth 2 (two) times daily.   aspirin  EC 81 MG tablet Take 1 tablet (81 mg total) by mouth daily.   atorvastatin  40 MG tablet Commonly known as: LIPITOR Take 1 tablet by mouth once daily   benzonatate  200 MG capsule Commonly known as: TESSALON  Take 1 capsule (200 mg total) by mouth 2 (two) times daily as needed for cough.   bisacodyl  10 MG suppository Commonly known as: DULCOLAX Place 1 suppository (10 mg total) rectally daily as needed for mild constipation or moderate constipation.   droxidopa 100 MG Caps Commonly known as: NORTHERA Take 1 capsule (100 mg total) by mouth 3 (three) times daily with meals for 9 days.   fludrocortisone  0.1 MG tablet Commonly known as: FLORINEF  Take 1 tablet (0.1 mg total) by mouth 2 (two) times daily.   FLUoxetine  20 MG capsule Commonly known as: PROZAC  Take 1 capsule by mouth once daily   ipratropium-albuterol  0.5-2.5 (3) MG/3ML Soln Commonly known as: DUONEB Take 3 mLs by nebulization every 6 (six) hours as needed.   iron  polysaccharides 150 MG capsule Commonly known as: NIFEREX Take 1 capsule (150  mg total) by mouth daily.   levETIRAcetam  250 MG tablet Commonly known as: KEPPRA  Take 250 mg by mouth 2 (two) times daily.   MEDNEB Nebuliz-Reuse-Disp Kit Misc For use with medication for the lungs.  Use as directed.   midodrine  10 MG tablet Commonly known as: PROAMATINE  Take 1 tablet (10 mg total) by mouth 3 (three) times daily with meals.   ondansetron  8 MG tablet Commonly known as: ZOFRAN  Take 1 tablet (8 mg total) by mouth every 8 (eight) hours as needed for nausea or vomiting.   OXYGEN  Inhale 4 L into the lungs continuous.   pantoprazole  40 MG tablet Commonly known as: Protonix  Take 1 tablet (40 mg total) by  mouth daily.   potassium chloride  10 MEQ tablet Commonly known as: KLOR-CON  Take 3 tablets (30 mEq total) by mouth daily.   pyridostigmine  60 MG tablet Commonly known as: Mestinon  Take 1 tablet (60 mg total) by mouth 3 (three) times daily.   sodium chloride  1 g tablet Take 1 tablet (1 g total) by mouth 2 (two) times daily with a meal.   Vitamin D3 50 MCG (2000 UT) capsule Take 2,000 Units by mouth daily.   zolpidem  10 MG tablet Commonly known as: AMBIEN  Take 5-10 mg by mouth at bedtime as needed.        Contact information for after-discharge care     Destination     [facility] .   Service: Skilled Nursing Contact information: [address] [place]  [address] [phone]                    Discharge Exam: Diana Cook   11/30/23 0915  Weight: 81.6 kg   Physical Exam Constitutional:      Appearance: Normal appearance.  HENT:     Head: Normocephalic and atraumatic.     Right Ear: External ear normal.     Left Ear: External ear normal.     Nose: Nose normal.     Mouth/Throat:     Mouth: Mucous membranes are moist.  Eyes:     Extraocular Movements: Extraocular movements intact.     Conjunctiva/sclera: Conjunctivae normal.     Pupils: Pupils are equal, round, and reactive to light.  Cardiovascular:     Rate and Rhythm: Normal rate and regular rhythm.  Pulmonary:     Effort: Pulmonary effort is normal.     Breath sounds: Normal breath sounds.  Abdominal:     General: Bowel sounds are normal.     Palpations: Abdomen is soft.  Musculoskeletal:        General: Normal range of motion.     Cervical back: Normal range of motion and neck supple.  Skin:    General: Skin is warm and dry.     Capillary Refill: Capillary refill takes less than 2 seconds.  Neurological:     General: No focal deficit present.     Mental Status: She is alert and oriented to person, place, and time.  Psychiatric:        Mood and Affect: Mood normal.         Behavior: Behavior normal.   Condition at discharge: fair  The results of significant diagnostics from this hospitalization (including imaging, microbiology, ancillary and laboratory) are listed below for reference.   Imaging Studies: MR BRAIN W WO CONTRAST Result Date: 12/03/2023 CLINICAL DATA:  Headache, increasing frequency or severity Headache, new onset (Age >= 51y). Recurrent weakness, hypotension, dizziness, and weight loss.  EXAM: MRI HEAD WITHOUT AND WITH CONTRAST TECHNIQUE: Multiplanar, multiecho pulse sequences of the brain and surrounding structures were obtained without and with intravenous contrast. CONTRAST:  8mL GADAVIST GADOBUTROL 1 MMOL/ML IV SOLN COMPARISON:  Head CT 09/14/2023 FINDINGS: Brain: There is no evidence of an acute infarct, midline shift, hydrocephalus, or extra-axial fluid collection. A few scattered chronic microhemorrhages are noted in the cerebral hemispheres and brainstem, nonspecific. Patchy T2 hyperintensities in the cerebral white matter bilaterally are nonspecific but compatible with moderate chronic small vessel ischemic disease. There is an approximately 2.5 cm region of mild hazy T2 FLAIR hyperintensity in the inferior left occipital lobe involving cortex and juxtacortical white matter with blurring of the gray-white junction, possible slight gyral expansion, and no associated enhancement or diffusion abnormality (series 9, image 19 and series 15, image 7). Mild cerebral atrophy is within normal limits for age. No abnormal intracranial enhancement is identified. Vascular: Major intracranial vascular flow voids are preserved. Skull and upper cervical spine: Unremarkable bone marrow signal. Sinuses/Orbits: Bilateral cataract extraction. Paranasal sinuses and mastoid air cells are clear. Other: None. IMPRESSION: 1. 2.5 cm region of nonenhancing T2 signal abnormality in the inferior left occipital lobe, indeterminate but could reflect focal cortical dysplasia,  low-grade glioma, or less likely encephalitis. Short-term MRI follow-up is recommended. 2. Moderate chronic small vessel ischemic disease. Electronically Signed   By: Dasie Hamburg M.D.   On: 12/03/2023 20:20   DG Chest Portable 1 View Result Date: 11/23/2023 CLINICAL DATA:  73 year old female with weakness. EXAM: PORTABLE CHEST 1 VIEW COMPARISON:  Chest radiographs 10/29/2023 and earlier. FINDINGS: Portable AP upright view at 1034 hours. Emphysema by CTA in July. Chronic curvilinear scarring at the level of the right hilum appears unchanged since that time. Streaky left perihilar opacity is increased from the July CTA, but regressed from earlier this month. Stable left chest pacemaker. Stable cardiac size and mediastinal contours. Calcified aortic atherosclerosis. Visualized tracheal air column is within normal limits. No pneumothorax or pulmonary edema. No pleural effusion or other confluent lung opacity. IMPRESSION: Emphysema (ICD10-J43.9). Streaky left perihilar lung opacity regressed but not fully resolved since 10/29/2023. Consider unresolved infectious exacerbation. Stable curvilinear right perihilar lung scarring since July. Electronically Signed   By: VEAR Hurst M.D.   On: 11/23/2023 10:59    Microbiology: Results for orders placed or performed during the hospital encounter of 11/23/23  Blood Culture (routine x 2)     Status: None   Collection Time: 11/23/23  3:10 PM   Specimen: BLOOD  Result Value Ref Range Status   Specimen Description BLOOD BLOOD RIGHT ARM  Final   Special Requests   Final    BOTTLES DRAWN AEROBIC ONLY Blood Culture results may not be optimal due to an inadequate volume of blood received in culture bottles   Culture   Final    NO GROWTH 5 DAYS Performed at [facility], [address] [place], [place] [address]    Report Status 11/28/2023 FINAL  Final  Resp panel by RT-PCR (RSV, Flu A&B, Covid) Anterior Nasal Swab     Status: None   Collection Time: 11/23/23   4:38 PM   Specimen: Anterior Nasal Swab  Result Value Ref Range Status   SARS Coronavirus 2 by RT PCR NEGATIVE NEGATIVE Final    Comment: (NOTE) SARS-CoV-2 target nucleic acids are NOT DETECTED.  The SARS-CoV-2 RNA is generally detectable in upper respiratory specimens during the acute phase of infection. The lowest concentration of SARS-CoV-2 viral copies this  assay can detect is 138 copies/mL. A negative result does not preclude SARS-Cov-2 infection and should not be used as the sole basis for treatment or other patient management decisions. A negative result may occur with  improper specimen collection/handling, submission of specimen other than nasopharyngeal swab, presence of viral mutation(s) within the areas targeted by this assay, and inadequate number of viral copies(<138 copies/mL). A negative result must be combined with clinical observations, patient history, and epidemiological information. The expected result is Negative.  Fact Sheet for Patients:  BloggerCourse.com  Fact Sheet for Healthcare Providers:  SeriousBroker.it  This test is no t yet approved or cleared by the [place]  FDA and  has been authorized for detection and/or diagnosis of SARS-CoV-2 by FDA under an Emergency Use Authorization (EUA). This EUA will remain  in effect (meaning this test can be used) for the duration of the COVID-19 declaration under Section 564(b)(1) of the Act, 21 U.S.C.section 360bbb-3(b)(1), unless the authorization is terminated  or revoked sooner.       Influenza A by PCR NEGATIVE NEGATIVE Final   Influenza B by PCR NEGATIVE NEGATIVE Final    Comment: (NOTE) The Xpert Xpress SARS-CoV-2/FLU/RSV plus assay is intended as an aid in the diagnosis of influenza from Nasopharyngeal swab specimens and should not be used as a sole basis for treatment. Nasal washings and aspirates are unacceptable for Xpert Xpress  SARS-CoV-2/FLU/RSV testing.  Fact Sheet for Patients: BloggerCourse.com  Fact Sheet for Healthcare Providers: SeriousBroker.it  This test is not yet approved or cleared by the [place]  FDA and has been authorized for detection and/or diagnosis of SARS-CoV-2 by FDA under an Emergency Use Authorization (EUA). This EUA will remain in effect (meaning this test can be used) for the duration of the COVID-19 declaration under Section 564(b)(1) of the Act, 21 U.S.C. section 360bbb-3(b)(1), unless the authorization is terminated or revoked.     Resp Syncytial Virus by PCR NEGATIVE NEGATIVE Final    Comment: (NOTE) Fact Sheet for Patients: BloggerCourse.com  Fact Sheet for Healthcare Providers: SeriousBroker.it  This test is not yet approved or cleared by the [place]  FDA and has been authorized for detection and/or diagnosis of SARS-CoV-2 by FDA under an Emergency Use Authorization (EUA). This EUA will remain in effect (meaning this test can be used) for the duration of the COVID-19 declaration under Section 564(b)(1) of the Act, 21 U.S.C. section 360bbb-3(b)(1), unless the authorization is terminated or revoked.  Performed at [facility], [address] [place], [place] [address]   Blood Culture (routine x 2)     Status: None   Collection Time: 11/23/23  9:20 PM   Specimen: BLOOD  Result Value Ref Range Status   Specimen Description BLOOD BLOOD RIGHT HAND  Final   Special Requests   Final    BOTTLES DRAWN AEROBIC AND ANAEROBIC Blood Culture results may not be optimal due to an inadequate volume of blood received in culture bottles   Culture   Final    NO GROWTH 5 DAYS Performed at [facility], [address] [place], [place] [address]    Report Status 11/28/2023 FINAL  Final   *Note: Due to a large number of results and/or encounters for the  requested time period, some results have not been displayed. A complete set of results can be found in Results Review.   Labs: CBC: Recent Labs  Lab 12/01/23 0421 12/03/23 1243 12/04/23 0455 12/07/23 0326  WBC 8.1 10.7* 7.7 6.3  HGB 11.9*  12.9 11.6* 10.8*  HCT 37.4 40.2 36.9 34.1*  MCV 82.9 83.2 83.5 82.8  PLT 300 289 291 330   Basic Metabolic Panel: Recent Labs  Lab 12/01/23 0421 12/03/23 1243 12/04/23 1545 12/05/23 0552 12/06/23 1259 12/07/23 0326  NA 135 136 133* 137  --  138  K 3.5 3.5 3.3* 3.0*  --  3.8  CL 102 101 98 102  --  102  CO2 24 26 25 27   --  27  GLUCOSE 101* 99 105* 101*  --  100*  BUN 22 13 20 16   --  15  CREATININE 0.63 0.76 0.98 0.78  --  0.70  CALCIUM  9.1 9.0 8.9 8.9  --  8.9  MG  --   --   --   --  2.0  --    Discharge time spent: greater than 30 minutes.  Signed: Dr. Sherre [facility] 12/07/2023

## 2023-12-07 NOTE — TOC Transition Note (Signed)
 Transition of Care [facility]) - Discharge Note   Patient Details  Name: Diana Cook MRN: [id number] Date of Birth: 01/31/1951  Transition [facility] CM/SW Contact:  [name], RN Phone Number: 12/07/2023, 3:33 PM   Clinical Narrative:      Patient will DC to: [facility]  Anticipated DC date: 12/07/23  Family notified: Mr Stammen spouse Transport by: Civil engineer, contracting  Per MD patient ready for DC to . RN, patient, patient's family, and facility notified of DC. Discharge Summary sent to facility. RN given number for report. DC packet on chart. Ambulance transport requested for patient.   TOC signing off.      Patient Goals and CMS Choice            Discharge Placement                       Discharge Plan and Services Additional resources added to the After Visit Summary for                                       Social Drivers of Health (SDOH) Interventions SDOH Screenings   Food Insecurity: No Food Insecurity (11/30/2023)  Housing: Low Risk  (11/30/2023)  Transportation Needs: No Transportation Needs (11/30/2023)  Utilities: Not At Risk (11/30/2023)  Alcohol Screen: Low Risk  (05/13/2023)  Depression (PHQ2-9): Low Risk  (05/08/2023)  Recent Concern: Depression (PHQ2-9) - Medium Risk (03/25/2023)  Financial Resource Strain: Low Risk  (05/13/2023)  Physical Activity: Inactive (05/13/2023)  Social Connections: Socially Integrated (11/30/2023)  Stress: No Stress Concern Present (05/13/2023)  Tobacco Use: Medium Risk (11/30/2023)  Health Literacy: Adequate Health Literacy (03/06/2023)     Readmission Risk Interventions     No data to display

## 2023-12-07 NOTE — TOC Progression Note (Signed)
 Transition of Care [facility]) - Progression Note    Patient Details  Name: Diana Cook MRN: [id number] Date of Birth: 12-07-1950  [facility] CM/SW Contact  [name], RN Phone Number: 12/07/2023, 10:03 AM  Clinical Narrative:      Alfonso at [facility] confirms bed availability.  Bed accepted, and Requested Rojelio with [facility] to start auth                   Expected Discharge Plan and Services                                               Social Drivers of Health (SDOH) Interventions SDOH Screenings   Food Insecurity: No Food Insecurity (11/30/2023)  Housing: Low Risk  (11/30/2023)  Transportation Needs: No Transportation Needs (11/30/2023)  Utilities: Not At Risk (11/30/2023)  Alcohol Screen: Low Risk  (05/13/2023)  Depression (PHQ2-9): Low Risk  (05/08/2023)  Recent Concern: Depression (PHQ2-9) - Medium Risk (03/25/2023)  Financial Resource Strain: Low Risk  (05/13/2023)  Physical Activity: Inactive (05/13/2023)  Social Connections: Socially Integrated (11/30/2023)  Stress: No Stress Concern Present (05/13/2023)  Tobacco Use: Medium Risk (11/30/2023)  Health Literacy: Adequate Health Literacy (03/06/2023)    Readmission Risk Interventions     No data to display

## 2023-12-07 NOTE — Discharge Instructions (Addendum)
 Goodrx below is for Droxidopa at [facility]. Please look at goodrx for cost for other pharmacies. Other option include [facility] and [facility].

## 2023-12-07 NOTE — Telephone Encounter (Signed)
 Pharmacy Patient Advocate Encounter   Received notification from Inpatient Request that prior authorization for Droxidopa 100 mg Capsules is required/requested.   Insurance verification completed.   The patient is insured through [employer].   Per test claim: PA required; PA submitted to above mentioned insurance via Latent Key/confirmation #/EOC [facility] Status is pending

## 2023-12-07 NOTE — Telephone Encounter (Signed)
 Pharmacy Patient Advocate Encounter  Received notification from [facility] that Prior Authorization for Droxidopa 100 mg capsules has been APPROVED from 12/07/2023 to 01/07/2024. Ran test claim, Copay is $0.00. This test claim was processed through [facility]- copay amounts may vary at other pharmacies due to pharmacy/plan contracts, or as the patient moves through the different stages of their insurance plan.   PA #/Case ID/Reference #: [id number]

## 2023-12-07 NOTE — Telephone Encounter (Signed)
 Patient Product/process development scientist completed.    The patient is insured through [facility]. Patient has Medicare and is not eligible for a copay card, but may be able to apply for patient assistance or Medicare RX Payment Plan (Patient Must reach out to their plan, if eligible for payment plan), if available.    Ran test claim for droxidopa 100 mg and Requires Prior Authorization   This test claim was processed through [employer]- copay amounts may vary at other pharmacies due to [employer], or as the patient moves through the different stages of their insurance plan.     Reyes Sharps, CPHT Pharmacy Technician Patient Advocate Specialist [facility] Pharm[facility] Patient Advocate Team Direct Number: [phone]  Fax: [phone]

## 2023-12-08 ENCOUNTER — Encounter: Payer: Self-pay | Admitting: Nurse Practitioner

## 2023-12-08 ENCOUNTER — Non-Acute Institutional Stay (SKILLED_NURSING_FACILITY): Admitting: Nurse Practitioner

## 2023-12-08 DIAGNOSIS — I442 Atrioventricular block, complete: Secondary | ICD-10-CM

## 2023-12-08 DIAGNOSIS — I480 Paroxysmal atrial fibrillation: Secondary | ICD-10-CM | POA: Diagnosis not present

## 2023-12-08 DIAGNOSIS — I5032 Chronic diastolic (congestive) heart failure: Secondary | ICD-10-CM

## 2023-12-08 DIAGNOSIS — F5101 Primary insomnia: Secondary | ICD-10-CM

## 2023-12-08 DIAGNOSIS — J432 Centrilobular emphysema: Secondary | ICD-10-CM

## 2023-12-08 DIAGNOSIS — R569 Unspecified convulsions: Secondary | ICD-10-CM

## 2023-12-08 DIAGNOSIS — D649 Anemia, unspecified: Secondary | ICD-10-CM

## 2023-12-08 DIAGNOSIS — I951 Orthostatic hypotension: Secondary | ICD-10-CM | POA: Diagnosis not present

## 2023-12-08 DIAGNOSIS — I959 Hypotension, unspecified: Secondary | ICD-10-CM

## 2023-12-08 NOTE — Progress Notes (Unsigned)
 Location:  Other [facility] Room Number: 108 [id number] Place of Service:  SNF (31)  Tower, Laine LABOR, MD  Patient Care Team: Tower, Laine LABOR, MD as PCP - General (Family Medicine) End, Lonni, MD as PCP - Cardiology (Cardiology) Cindie Ole DASEN, MD as PCP - Electrophysiology (Clinical Cardiac Electrophysiology) Carolee Manus DASEN., MD as Referring Physician (Ophthalmology) Tower, Laine LABOR, MD as Consulting Physician (Family Medicine) Riddle, Suzann, NP as Nurse Practitioner (Clinical Cardiac Electrophysiology)  Extended Emergency Contact Information Primary Emergency Contact: Fellner,William T Address: [address], [place] [address] [place] Mobile Phone: [phone] Relation: Spouse  Goals of care: Advanced Directive information    11/30/2023    5:12 PM  Advanced Directives  Does patient want to make changes to medical advance directive? No - Patient declined     Chief Complaint  Patient presents with   Hospitalization Follow-up    Follow-up from recent hospital stay     HPI:  Pt is a 73 y.o. female seen today for medical management of chronic disease. ***   Past Medical History:  Diagnosis Date   Allergic rhinitis    Arthritis    Asthma    as a child, mild now   Breast cancer (HCC) 12/2015   right breast cancer, lumpectomy and mammosite    Cardiac pacemaker in situ    Colon polyps    colonoscopy 7/08, tubular adenoma   Complete heart block (HCC) 09/2011   s/p PPM implanted in [facility]   COPD (chronic obstructive pulmonary disease) (HCC)    Myocardial infarction (HCC) 2011   Orthostatic hypotension    Osteopenia 10/2015   Pacemaker    2011   Paroxysmal atrial fibrillation (HCC) 03/2021   Incidentally detected on pacemaker interrogation   Personal history of radiation therapy 2017   right breast ca, mammosite placed   Rotator cuff tear, right 02/2022   Had an MRI in [place] I have 2 tears.   Seizure disorder (HCC)     Seizures (HCC)    first one was when she was 73 years old    Small bowel obstruction (HCC)    1988 and 2002   Tobacco abuse    Past Surgical History:  Procedure Laterality Date   ABDOMINAL HYSTERECTOMY     BREAST BIOPSY Right 2007   benign inflammatory changes, mass due to underwire bra   BREAST BIOPSY Left 01/02/2016   columnar cell changes without atypical hyperplasia.   BREAST BIOPSY Right 12/06/2015   rt breast mass 10:00, bx done at Dr. Gilberto office, invasive ductal carcinoma   BREAST EXCISIONAL BIOPSY Left 01/02/2016   COLUMNAR CELL CHANGE AND HYPERPLASIA ASSOCIATED WITH LUMINAL AND STROMAL CALCIFICATIONS   BREAST LUMPECTOMY Right 01/02/2016   invasive mammary carcinoma, clear margins, negative LN   BREAST LUMPECTOMY WITH SENTINEL LYMPH NODE BIOPSY Right 01/02/2016   pT1c, N0; ER/ PR 100%; Her 2 neu not over expressed: BREAST LUMPECTOMY WITH SENTINEL LYMPH NODE BX;  Surgeon: Reyes LELON Cota, MD;  Location: [facility] ORS;  Service: General;  Laterality: Right;   CARDIAC CATHETERIZATION     CAROTID PTA/STENT INTERVENTION Right 04/29/2022   Procedure: CAROTID PTA/STENT INTERVENTION;  Surgeon: Jama Cordella MATSU, MD;  Location: [facility] INVASIVE CV LAB;  Service: Cardiovascular;  Laterality: Right;   CATARACT EXTRACTION Bilateral    COLONOSCOPY  10/2015   Dr Teressa   COLONOSCOPY WITH PROPOFOL  N/A 08/19/2018   Procedure: COLONOSCOPY WITH  PROPOFOL ;  Surgeon: Janalyn Keene NOVAK, MD;  Location[facility] ENDOSCOPY;  Service: Endoscopy;  Laterality: N/A;   ESOPHAGOGASTRODUODENOSCOPY (EGD) WITH PROPOFOL  N/A 08/19/2018   Procedure: ESOPHAGOGASTRODUODENOSCOPY (EGD) WITH PROPOFOL ;  Surgeon: Janalyn Keene NOVAK, MD;  Location: [facility] ENDOSCOPY;  Service: Endoscopy;  Laterality: N/A;   EXPLORATORY LAPAROTOMY  01/25/2001   Exploratory laparotomy, lysis of adhesions, identification of internal hernia secondary to omental adhesion. Prolonged postoperative ileus.   gyn surgery  1993   hysterectomy-  form endometriosis   LAPAROSCOPY     PACEMAKER INSERTION  10/24/2011   [employer] Advantio dual chamber PPM implanted by Dr Ronny at [facility] in [place]   PPM GENERATOR CHANGEOUT N/A 12/05/2019   Procedure: PPM GENERATOR HERMA;  Surgeon: Fernande Elspeth BROCKS, MD;  Location: [facility] INVASIVE CV LAB;  Service: Cardiovascular;  Laterality: N/A;   RIGHT/LEFT HEART CATH AND CORONARY ANGIOGRAPHY Bilateral 07/19/2019   Procedure: RIGHT/LEFT HEART CATH AND CORONARY ANGIOGRAPHY;  Surgeon: Mady Bruckner, MD;  Location: [facility] INVASIVE CV LAB;  Service: Cardiovascular;  Laterality: Bilateral;   TEMPORARY PACEMAKER N/A 12/05/2019   Procedure: TEMPORARY PACEMAKER;  Surgeon: Fernande Elspeth BROCKS, MD;  Location: [facility] INVASIVE CV LAB;  Service: Cardiovascular;  Laterality: N/A;    Allergies  Allergen Reactions   Codeine Nausea And Vomiting   Morphine  And Codeine Nausea Only    Outpatient Encounter Medications as of 12/08/2023  Medication Sig   acetaminophen  (TYLENOL ) 500 MG tablet Take 500-1,000 mg by mouth every 6 (six) hours as needed for mild pain or headache.    albuterol  (VENTOLIN  HFA) 108 (90 Base) MCG/ACT inhaler INHALE 2 PUFFS BY MOUTH EVERY 4 HOURS AS NEEDED FOR WHEEZING OR SHORTNESS OF BREATH   apixaban  (ELIQUIS ) 5 MG TABS tablet Take 1 tablet (5 mg total) by mouth 2 (two) times daily.   aspirin  EC 81 MG tablet Take 1 tablet (81 mg total) by mouth daily.   atorvastatin  (LIPITOR) 40 MG tablet Take 1 tablet by mouth once daily   benzonatate  (TESSALON ) 200 MG capsule Take 1 capsule (200 mg total) by mouth 2 (two) times daily as needed for cough.   bisacodyl  (DULCOLAX) 10 MG suppository Place 1 suppository (10 mg total) rectally daily as needed for mild constipation or moderate constipation.   Cholecalciferol  (VITAMIN D3) 50 MCG (2000 UT) capsule Take 2,000 Units by mouth daily.   droxidopa (NORTHERA) 100 MG CAPS Take 1 capsule (100 mg total) by mouth 3 (three) times daily  with meals for 9 days.   fludrocortisone  (FLORINEF ) 0.1 MG tablet Take 1 tablet (0.1 mg total) by mouth 2 (two) times daily.   FLUoxetine  (PROZAC ) 20 MG capsule Take 1 capsule by mouth once daily   ipratropium-albuterol  (DUONEB) 0.5-2.5 (3) MG/3ML SOLN Take 3 mLs by nebulization every 6 (six) hours as needed.   iron  polysaccharides (NIFEREX) 150 MG capsule Take 1 capsule (150 mg total) by mouth daily.   levETIRAcetam  (KEPPRA ) 250 MG tablet Take 250 mg by mouth 2 (two) times daily.    midodrine  (PROAMATINE ) 10 MG tablet Take 1 tablet (10 mg total) by mouth 3 (three) times daily with meals.   ondansetron  (ZOFRAN ) 8 MG tablet Take 1 tablet (8 mg total) by mouth every 8 (eight) hours as needed for nausea or vomiting.   OXYGEN  Inhale 4 L into the lungs continuous.   pantoprazole  (PROTONIX ) 40 MG tablet Take 1 tablet (40 mg total) by mouth daily.   potassium chloride  (KLOR-CON ) 10 MEQ tablet Take 3 tablets (30  mEq total) by mouth daily.   pyridostigmine  (MESTINON ) 60 MG tablet Take 1 tablet (60 mg total) by mouth 3 (three) times daily.   sodium chloride  1 g tablet Take 1 tablet (1 g total) by mouth 2 (two) times daily with a meal.   zolpidem  (AMBIEN ) 10 MG tablet Take 5-10 mg by mouth at bedtime as needed.   Nebulizers (COMPRESSOR/NEBULIZER) MISC For use with medication for the lungs.  Use as directed.   No facility-administered encounter medications on file as of 12/08/2023.    Review of Systems ***  Immunization History  Administered Date(s) Administered    sv, Bivalent, Protein Subunit Rsvpref,pf (Abrysvo) 12/31/2022   Fluad Quad(high Dose 65+) 10/24/2020   INFLUENZA, HIGH DOSE SEASONAL PF 11/04/2018, 11/22/2019, 12/31/2022   Influenza Whole 02/24/2005   Influenza,inj,Quad PF,6+ Mos 12/03/2012, 12/09/2013, 01/16/2015, 02/06/2016, 11/04/2016, 12/08/2017   Influenza-Unspecified 10/25/2021   PFIZER Comirnaty(Gray Top)Covid-19 Tri-Sucrose Vaccine 07/06/2020   PFIZER(Purple Top)SARS-COV-2  Vaccination 02/28/2019, 03/21/2019, 11/14/2019   PNEUMOCOCCAL CONJUGATE-20 07/06/2020   Pneumococcal Conjugate-13 07/11/2014   Pneumococcal Polysaccharide-23 03/21/2005, 07/26/2015   Td 01/09/1999, 06/14/2007   Zoster, Live 09/07/2015   Pertinent  Health Maintenance Due  Topic Date Due   Mammogram  01/06/2024   Colonoscopy  03/24/2024 (Originally 08/18/2021)   Influenza Vaccine  05/24/2024 (Originally 09/25/2023)   DEXA SCAN  Completed      10/08/2022   11:38 AM 03/06/2023    1:12 PM 03/25/2023    2:04 PM 05/08/2023   11:10 AM 10/29/2023   12:09 PM  Fall Risk  Falls in the past year? 1 0 0 0 1  Was there an injury with Fall? 1 0 0 0 1  Fall Risk Category Calculator 2 0 0 0 3  Patient at Risk for Falls Due to  No Fall Risks No Fall Risks No Fall Risks History of fall(s)  Fall risk Follow up Falls evaluation completed Education provided;Falls prevention discussed Falls evaluation completed Falls evaluation completed Falls evaluation completed   Functional Status Survey:    Vitals:   12/08/23 1355  BP: 139/75  Pulse: 71  Weight: 186 lb (84.4 kg)  Height: 5' 9 (1.753 m)   Body mass index is 27.47 kg/m. Physical Exam***  Labs reviewed: Recent Labs    09/02/23 0507 09/03/23 0458 09/04/23 0536 09/05/23 9374 09/26/23 0520 09/28/23 0553 12/04/23 1545 12/05/23 0552 12/06/23 1259 12/07/23 0326  NA 134* 134* 133*   < > 138   < > 133* 137  --  138  K 4.1 4.2 4.3   < > 2.9*   < > 3.3* 3.0*  --  3.8  CL 104 101 101   < > 102   < > 98 102  --  102  CO2 22 21* 22   < > 27   < > 25 27  --  27  GLUCOSE 105* 108* 110*   < > 119*   < > 105* 101*  --  100*  BUN 14 19 14    < > 13   < > 20 16  --  15  CREATININE 0.82 0.74 0.87   < > 0.64   < > 0.98 0.78  --  0.70  CALCIUM  9.0 9.0 8.9   < > 8.6*   < > 8.9 8.9  --  8.9  MG 2.0 2.0 2.0  --  1.7  --   --   --  2.0  --   PHOS 3.0 3.5 3.3  --   --   --   --   --   --   --    < > =  values in this interval not displayed.   Recent Labs     09/25/23 2305 11/18/23 1407 11/23/23 0926  AST 17 20 26   ALT 12 13 17   ALKPHOS 83 92 96  BILITOT 0.9 0.9 2.0*  PROT 7.8 7.6 8.5*  ALBUMIN 3.9 4.0 4.5   Recent Labs    10/29/23 1246 11/06/23 1353 11/18/23 1407 11/23/23 0926 11/24/23 0555 12/03/23 1243 12/04/23 0455 12/07/23 0326  WBC 9.1 8.2   < > 14.7*   < > 10.7* 7.7 6.3  NEUTROABS 7.4 6.4  --  12.4*  --   --   --   --   HGB 10.7* 10.8*   < > 14.3   < > 12.9 11.6* 10.8*  HCT 32.8* 33.2*   < > 45.6   < > 40.2 36.9 34.1*  MCV 84.4 84.1   < > 83.8   < > 83.2 83.5 82.8  PLT 313.0 397.0   < > 421*   < > 289 291 330   < > = values in this interval not displayed.   Lab Results  Component Value Date   TSH 1.764 11/30/2023   Lab Results  Component Value Date   HGBA1C 6.2 03/18/2023   Lab Results  Component Value Date   CHOL 156 03/18/2023   HDL 64.10 03/18/2023   LDLCALC 72 03/18/2023   TRIG 98.0 03/18/2023   CHOLHDL 2 03/18/2023    Significant Diagnostic Results in last 30 days:  MR BRAIN W WO CONTRAST Result Date: 12/03/2023 CLINICAL DATA:  Headache, increasing frequency or severity Headache, new onset (Age >= 51y). Recurrent weakness, hypotension, dizziness, and weight loss. EXAM: MRI HEAD WITHOUT AND WITH CONTRAST TECHNIQUE: Multiplanar, multiecho pulse sequences of the brain and surrounding structures were obtained without and with intravenous contrast. CONTRAST:  8mL GADAVIST GADOBUTROL 1 MMOL/ML IV SOLN COMPARISON:  Head CT 09/14/2023 FINDINGS: Brain: There is no evidence of an acute infarct, midline shift, hydrocephalus, or extra-axial fluid collection. A few scattered chronic microhemorrhages are noted in the cerebral hemispheres and brainstem, nonspecific. Patchy T2 hyperintensities in the cerebral white matter bilaterally are nonspecific but compatible with moderate chronic small vessel ischemic disease. There is an approximately 2.5 cm region of mild hazy T2 FLAIR hyperintensity in the inferior left occipital lobe  involving cortex and juxtacortical white matter with blurring of the gray-white junction, possible slight gyral expansion, and no associated enhancement or diffusion abnormality (series 9, image 19 and series 15, image 7). Mild cerebral atrophy is within normal limits for age. No abnormal intracranial enhancement is identified. Vascular: Major intracranial vascular flow voids are preserved. Skull and upper cervical spine: Unremarkable bone marrow signal. Sinuses/Orbits: Bilateral cataract extraction. Paranasal sinuses and mastoid air cells are clear. Other: None. IMPRESSION: 1. 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 Short-term MRI follow-up is recommended. 2. Moderate chronic small vessel ischemic disease. Electronically Signed   By: Dasie Hamburg M.D.   On: 12/03/2023 20:20   DG Chest Portable 1 View Result Date: 11/23/2023 CLINICAL DATA:  73 year old female with weakness. EXAM: PORTABLE CHEST 1 VIEW COMPARISON:  Chest radiographs 10/29/2023 and earlier. FINDINGS: Portable AP upright view at 1034 hours. Emphysema by CTA in July. Chronic curvilinear scarring at the level of the right hilum appears unchanged since that time. Streaky left perihilar opacity is increased from the July CTA, but regressed from earlier this month. Stable  left chest pacemaker. Stable cardiac size and mediastinal contours. Calcified aortic atherosclerosis. Visualized tracheal air column is within normal limits. No pneumothorax or pulmonary edema. No pleural effusion or other confluent lung opacity. IMPRESSION: Emphysema (ICD10-J43.9). Streaky left perihilar lung opacity regressed but not fully resolved since 10/29/2023. Consider unresolved infectious exacerbation. Stable curvilinear right perihilar lung scarring since July. Electronically Signed   By: VEAR Hurst M.D.   On: 11/23/2023 10:59     Assessment/Plan No problem-specific Assessment & Plan notes found for this encounter.     Bracha Frankowski K. Caro BODILY [facility] [phone]

## 2023-12-09 ENCOUNTER — Encounter: Payer: Self-pay | Admitting: Internal Medicine

## 2023-12-09 ENCOUNTER — Non-Acute Institutional Stay (SKILLED_NURSING_FACILITY): Payer: Self-pay | Admitting: Internal Medicine

## 2023-12-09 DIAGNOSIS — J9611 Chronic respiratory failure with hypoxia: Secondary | ICD-10-CM

## 2023-12-09 DIAGNOSIS — R569 Unspecified convulsions: Secondary | ICD-10-CM | POA: Diagnosis not present

## 2023-12-09 DIAGNOSIS — R55 Syncope and collapse: Secondary | ICD-10-CM | POA: Diagnosis not present

## 2023-12-09 DIAGNOSIS — D509 Iron deficiency anemia, unspecified: Secondary | ICD-10-CM | POA: Diagnosis not present

## 2023-12-09 LAB — MISC LABCORP TEST (SEND OUT): Labcorp test code: 50513

## 2023-12-09 NOTE — Patient Instructions (Signed)
 See assessment and plan under each diagnosis in the problem list and acutely for this visit

## 2023-12-09 NOTE — Assessment & Plan Note (Signed)
 10/6 - 12/07/2023 hospitalization for syncope and orthostatic hypotension.  Initial CBC normal; final H/H 10.8/34.1 with normochromic, normocytic indices.  No bleeding dyscrasias reported; this is most likely related to the multiple lab draws while hospitalized.  Continue to monitor.

## 2023-12-09 NOTE — Progress Notes (Signed)
 NURSING HOME LOCATION: [facility]  ROOM NUMBER: [id number]  CODE STATUS: DNR  PCP: Laine Balls MD  This is a comprehensive admission note to this SNFperformed on this date less than 30 days from date of admission. Included are preadmission medical/surgical history; reconciled medication list; family history; social history and comprehensive review of systems.  Corrections and additions to the records were documented. Comprehensive physical exam was also performed. Additionally a clinical summary was entered for each active diagnosis pertinent to this admission in the Problem List to enhance continuity of care.  HPI: Diana Cook was hospitalized 10/6 - 12/07/2023 with syncope in context of orthostatic hypotension.  Therapies included Florinef , midodrine , Mestinon , abdominal binder, and thigh-high TED hose.  Cardiology consulted and prescribed Northera/droxidopa 100 mg 3 times a day with meals. MRI revealed a 2.5 cm nonenhancing T2 signal abnormality in the inferior left occipital lobe suggesting focal cortical dysplasia, low-grade glioma or encephalitis. Neurology consulted and initially ordered acetylcholine receptor binding testing, but then apparently cancelled the order.  Neurology did not feel the glioma explained the syncopal episodes.  Keppra  250 mg twice daily maintenance was continued as seizure precaution. Admission labs revealed a potassium of 5.6, creatinine of 1.01 and GFR 58.  Troponin high-sensitivity peaked at 28.  CBC was normal and TSH therapeutic.  While hospitalized, mild hyperglycemia was documented with glucoses ranging from a low of 99 up to a high of 116.  Diana Cook did develop normochromic, normocytic anemia with final H/H of 10.8/34.1.  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 Potassium supplement 40 mill equivalents x 2 was ordered for nadir potassium of 3.0.  Final potassium was 3.8. A DYS2 dysautonomia panel was ordered to evaluate for  autoimmune dysautonomia, a condition where the body's immune system attacks its own autonomic nervous system. Diana Cook had a recurrent syncopal event overnight 10/10.  Diana Cook described dizziness as Diana Cook tried to get to the bedside commode and fell to the floor.  No significant injury or complication was documented.   At discharge furosemide  40 mg and gabapentin  100 mg were discontinued. PT/OT consulted & recommended SNF placement for rehab.  Past medical and surgical history: Includes history of allergic rhinitis; history of asthma; history of breast cancer; history of colon polyps; COPD; CAD with history of MI; history of complete heart block; history of PAF; history of seizure disorder; and history of small bowel obstruction. Surgeries and procedures include abdominal hysterectomy; multiple breast biopsies; breast lumpectomy; cardiac catheterization; carotid PTA/stent intervention; colonoscopy with polypectomy; EGD; and pacemaker insertion.  Family history: reviewed, non contributory due to advanced age.  Social history: Social drinker; former smoker with a 63 pack year history consumption.   Review of systems: Diana Cook states that Diana Cook has been having these episodes of just blacking out intermittently since June.  They do not occur every time Diana Cook tries to mobilize but this is the commonality.  Diana Cook denies any cardiac or neurologic prodrome prior to the events.  Diana Cook states that Diana Cook was diagnosed with epilepsy at age 45 and was on medicines for 10 years.  After coming off the medication Diana Cook had a recurrent seizure in 2017, prompting resumption of antiseizure medications.  Diana Cook describes intermittent nonproductive cough.  Diana Cook has some watering of the right eye but no other extrinsic symptoms.  Postnasal drainage is a chronic issue.  Diana Cook describes occasional constipation; at present Diana Cook describes some watery bowel movements.  Diana Cook describes both anxiety & depression ,but  mainly anxiety.  Diana Cook has history of frequent  UTIs.  Diana Cook has no GU symptoms at this time.  Constitutional: No fever, significant weight change  Eyes: No redness, discharge, pain, vision change ENT/mouth: No purulent discharge, earache, change in hearing, sore throat  Cardiovascular: No chest pain, palpitations, paroxysmal nocturnal dyspnea, claudication, edema  Respiratory: No cough, sputum production, hemoptysis, DOE, significant snoring, apnea  Gastrointestinal: No heartburn, dysphagia, abdominal pain, nausea /vomiting, rectal bleeding, melena Genitourinary: No dysuria, hematuria, pyuria, incontinence, nocturia Musculoskeletal: No joint stiffness, joint swelling, weakness, pain Dermatologic: No rash, pruritus, change in appearance of skin Psychiatric: No significant insomnia, anorexia Endocrine: No change in hair/skin/nails, excessive thirst, excessive hunger, excessive urination  Hematologic/lymphatic: No significant bruising, lymphadenopathy, abnormal bleeding Allergy/immunology: No significant sneezing, urticaria, angioedema  Physical exam:  Pertinent or positive findings: Diana Cook is alert and oriented and comprehends her disease presentation.  Diana Cook is wearing nasal oxygen .  There is slight asymmetry in the nasolabial folds.  Dental hygiene is immaculate.  Heart sounds are somewhat distant.  Rhythm is regular.  Pacemaker is present.  Breath sounds are also decreased.  Diana Cook is wearing abdominal binder.  Despite TED hose there is trace edema @ the sock line.  Pedal pulses are decreased.  There is significant clubbing of the nailbeds.  Diana Cook has interosseous wasting of the hands.  Diana Cook exhibits slight tremor of the hands.  The left upper extremity is stronger to opposition than the right but Diana Cook attributes this to a rotator cuff issue with decreased range of motion of the right upper extremity.  Opposition testing is fair in all extremities.  The lower extremities exhibit  symmetric strength to opposition.  Diana Cook has scattered bruising over the upper  extremities, greater on the right.  There is a vertical faint ecchymotic area over the left lateral calf.  General appearance: no acute distress, increased work of breathing is present.   Lymphatic: No lymphadenopathy about the head, neck, axilla. Eyes: No conjunctival inflammation or lid edema is present. There is no scleral icterus. Ears:  External ear exam shows no significant lesions or deformities.   Nose:  External nasal examination shows no deformity or inflammation. Nasal mucosa are pink and moist without lesions, exudates Neck:  No thyromegaly, masses, tenderness noted.    Heart:  No gallop, murmur, click, rub.  Lungs:  without wheezes, rhonchi, rales, rubs. Abdomen: Bowel sounds are normal.  Abdomen is soft and nontender with no organomegaly, hernias, masses. GU: Deferred  Extremities:  No cyanosis. Neurologic exam: Balance, Rhomberg, finger to nose testing could not be completed due to clinical state Skin: Warm & dry w/o tenting. No significant lesions or rash.  See clinical summary under each active problem in the Problem List with associated updated therapeutic plan :  Syncope and collapse Repeat the isometric exercises discussed 4- 5 times prior to standing if you've been seated for a period of time.   [facility] Neurology follow-up with Dr. Lane today.  Chronic hypoxic respiratory failure (HCC) No respiratory distress or hypoxia on supplemental oxygen   & current pulmonary regimen.  No change indicated.  Continue to monitor.  Anemia 10/6 - 12/07/2023 hospitalization for syncope and orthostatic hypotension.  Initial CBC normal; final H/H 10.8/34.1 with normochromic, normocytic indices.  No bleeding dyscrasias reported; this is most likely related to the multiple lab draws while hospitalized.  Continue to monitor.

## 2023-12-09 NOTE — Assessment & Plan Note (Addendum)
 No respiratory distress or hypoxia on supplemental oxygen  current pulmonary regimen.  No change indicated.  Continue to monitor.

## 2023-12-09 NOTE — Assessment & Plan Note (Signed)
Repeat the isometric exercises discussed 4- 5 times prior to standing if you've been seated for a period of time.  

## 2023-12-09 NOTE — Assessment & Plan Note (Signed)
 Neurology follow-up with Dr. Lane today.

## 2023-12-14 ENCOUNTER — Other Ambulatory Visit

## 2023-12-14 ENCOUNTER — Telehealth: Payer: Self-pay | Admitting: Cardiology

## 2023-12-14 ENCOUNTER — Ambulatory Visit

## 2023-12-14 DIAGNOSIS — I442 Atrioventricular block, complete: Secondary | ICD-10-CM | POA: Diagnosis not present

## 2023-12-14 NOTE — Telephone Encounter (Signed)
  1. Has your device fired? no  2. Is you device beeping? no  3. Are you experiencing draining or swelling at device site? no  4. Are you calling to see if we received your device transmission? Pt said she in in rehab and and does not have her box with her so we will not get a transmission  5. Have you passed out? no   Please route to Device Clinic Pool

## 2023-12-14 NOTE — Telephone Encounter (Signed)
 Called and asked patient to please have her spouse bring her monitor to the rehab facility where she is at and plug it in next to her bed while she is continuing to be in rehab  Patient agreeable to this request  All questions and concerns addressed at this time  Patient appreciative of phone call

## 2023-12-15 ENCOUNTER — Non-Acute Institutional Stay (SKILLED_NURSING_FACILITY): Payer: Self-pay | Admitting: Internal Medicine

## 2023-12-15 ENCOUNTER — Encounter: Payer: Self-pay | Admitting: Internal Medicine

## 2023-12-15 DIAGNOSIS — Z66 Do not resuscitate: Secondary | ICD-10-CM

## 2023-12-15 DIAGNOSIS — I951 Orthostatic hypotension: Secondary | ICD-10-CM

## 2023-12-15 NOTE — Progress Notes (Signed)
 [facility] SNF Acute Care Progress Note    Location:  Other [place].  Nursing Home Room Number: [facility] [id number] Place of Service:  SNF [phone]) Camellia Door, DO   PCP: Randeen Laine LABOR, MD   Patient Care Team: Tower, Laine LABOR, MD as PCP - General (Family Medicine) End, Lonni, MD as PCP - Cardiology (Cardiology) Cindie Ole DASEN, MD as PCP - Electrophysiology (Clinical Cardiac Electrophysiology) Carolee Manus DASEN., MD as Referring Physician (Ophthalmology) Tower, Laine LABOR, MD as Consulting Physician (Family Medicine) Riddle, Suzann, NP as Nurse Practitioner (Clinical Cardiac Electrophysiology)   Extended Emergency Contact Information Primary Emergency Contact: Dieckman,William T Address: [address]          [place], [place] [address] [place] Mobile Phone: [phone] Relation: Spouse   Goals of care: Advanced Directive information    12/15/2023    3:12 PM  Advanced Directives  Does Patient Have a Medical Advance Directive? Yes  Type of Advance Directive Out of facility DNR (pink MOST or yellow form)  Does patient want to make changes to medical advance directive? No - Guardian declined     CODE STATUS:  Do Not Resuscitate (DNR)   Chief Complaint  Patient presents with   Hypertension    Hypertension, Code Status Discussion.      HPI: Patient is a 73 year old female currently admitted to acute skilled SNF at [facility] after being admitted to the hospital from October 6 through December 07, 2023.  She had been admitted there due to recurrent syncope and orthostatic hypotension.  She has known severe refractory orthostatic hypotension followed by [facility] cardiology.  Over the course of the last week, she has been having increasing systolic blood pressures with systolic pressures nearing 190.  She has not had any blood pressures below 100.  She also wanted to change her CODE STATUS to DO NOT RESUSCITATE.  She has not had any headaches or vision changes.  She has been  working with physical therapy therapy in order to get stronger so she can walk and go home.  Patient also has history of coronary disease, COPD, chronic hypoxic respiratory failure on 4 L of oxygen  continuously, history of pacemaker, history of A-fib on systemic anticoagulation  Patient states that currently living at the nursing home is not ideal for her.  She does not like how her life is going right now.  She wants to be DNR.  I called the patient's husband Charlena on her cell phone and spoke with him directly.  He agrees with the patient's decision.     Past Medical History:  Diagnosis Date   Allergic rhinitis    Arthritis    Asthma    as a child, mild now   Breast cancer (HCC) 12/2015   right breast cancer, lumpectomy and mammosite    Cardiac pacemaker in situ    Colon polyps    colonoscopy 7/08, tubular adenoma   Complete heart block (HCC) 09/2011   s/p PPM implanted in [place] [facility]   COPD (chronic obstructive pulmonary disease) (HCC)    Myocardial infarction (HCC) 2011   Orthostatic hypotension    Osteopenia 10/2015   Pacemaker    2011   Paroxysmal atrial fibrillation (HCC) 03/2021   Incidentally detected on pacemaker interrogation   Personal history of radiation therapy 2017   right breast ca, mammosite placed   Rotator cuff tear, right 02/2022   Had an MRI in [place] I have 2 tears.   Seizure disorder (  HCC)    Seizures (HCC)    first one was when she was 73 years old    Small bowel obstruction (HCC)    1988 and 2002   Tobacco abuse    Past Surgical History:  Procedure Laterality Date   ABDOMINAL HYSTERECTOMY     BREAST BIOPSY Right 2007   benign inflammatory changes, mass due to underwire bra   BREAST BIOPSY Left 01/02/2016   columnar cell changes without atypical hyperplasia.   BREAST BIOPSY Right 12/06/2015   rt breast mass 10:00, bx done at Dr. Gilberto office, invasive ductal carcinoma   BREAST EXCISIONAL BIOPSY Left 01/02/2016   COLUMNAR CELL CHANGE AND  HYPERPLASIA ASSOCIATED WITH LUMINAL AND STROMAL CALCIFICATIONS   BREAST LUMPECTOMY Right 01/02/2016   invasive mammary carcinoma, clear margins, negative LN   BREAST LUMPECTOMY WITH SENTINEL LYMPH NODE BIOPSY Right 01/02/2016   pT1c, N0; ER/ PR 100%; Her 2 neu not over expressed: BREAST LUMPECTOMY WITH SENTINEL LYMPH NODE BX;  Surgeon: Reyes LELON Cota, MD;  Location: [facility] ORS;  Service: General;  Laterality: Right;   CARDIAC CATHETERIZATION     CAROTID PTA/STENT INTERVENTION Right 04/29/2022   Procedure: CAROTID PTA/STENT INTERVENTION;  Surgeon: Jama Cordella MATSU, MD;  Location: [facility] INVASIVE CV LAB;  Service: Cardiovascular;  Laterality: Right;   CATARACT EXTRACTION Bilateral    COLONOSCOPY  10/2015   Dr Teressa   COLONOSCOPY WITH PROPOFOL  N/A 08/19/2018   Procedure: COLONOSCOPY WITH PROPOFOL ;  Surgeon: Janalyn Keene NOVAK, MD;  Location: [facility] ENDOSCOPY;  Service: Endoscopy;  Laterality: N/A;   ESOPHAGOGASTRODUODENOSCOPY (EGD) WITH PROPOFOL  N/A 08/19/2018   Procedure: ESOPHAGOGASTRODUODENOSCOPY (EGD) WITH PROPOFOL ;  Surgeon: Janalyn Keene NOVAK, MD;  Location: [facility] ENDOSCOPY;  Service: Endoscopy;  Laterality: N/A;   EXPLORATORY LAPAROTOMY  01/25/2001   Exploratory laparotomy, lysis of adhesions, identification of internal hernia secondary to omental adhesion. Prolonged postoperative ileus.   gyn surgery  1993   hysterectomy- form endometriosis   LAPAROSCOPY     PACEMAKER INSERTION  10/24/2011   [employer] Advantio dual chamber PPM implanted by Dr Ronny at [facility] in [place]   PPM GENERATOR CHANGEOUT N/A 12/05/2019   Procedure: PPM GENERATOR HERMA;  Surgeon: Fernande Elspeth BROCKS, MD;  Location: [facility] INVASIVE CV LAB;  Service: Cardiovascular;  Laterality: N/A;   RIGHT/LEFT HEART CATH AND CORONARY ANGIOGRAPHY Bilateral 07/19/2019   Procedure: RIGHT/LEFT HEART CATH AND CORONARY ANGIOGRAPHY;  Surgeon: Mady Bruckner, MD;  Location: [facility] INVASIVE CV LAB;   Service: Cardiovascular;  Laterality: Bilateral;   TEMPORARY PACEMAKER N/A 12/05/2019   Procedure: TEMPORARY PACEMAKER;  Surgeon: Fernande Elspeth BROCKS, MD;  Location: [facility] INVASIVE CV LAB;  Service: Cardiovascular;  Laterality: N/A;     Allergies  Allergen Reactions   Codeine Nausea And Vomiting   Morphine  And Codeine Nausea Only     Outpatient Encounter Medications as of 12/15/2023  Medication Sig   acetaminophen  (TYLENOL ) 500 MG tablet Take 500-1,000 mg by mouth every 6 (six) hours as needed for mild pain or headache.    albuterol  (VENTOLIN  HFA) 108 (90 Base) MCG/ACT inhaler INHALE 2 PUFFS BY MOUTH EVERY 4 HOURS AS NEEDED FOR WHEEZING OR SHORTNESS OF BREATH   apixaban  (ELIQUIS ) 5 MG TABS tablet Take 1 tablet (5 mg total) by mouth 2 (two) times daily.   aspirin  EC 81 MG tablet Take 1 tablet (81 mg total) by mouth daily.   atorvastatin  (LIPITOR) 40 MG tablet Take 1 tablet by mouth once daily   benzonatate  (TESSALON ) 200 MG  capsule Take 1 capsule (200 mg total) by mouth 2 (two) times daily as needed for cough.   bisacodyl  (DULCOLAX) 10 MG suppository Place 1 suppository (10 mg total) rectally daily as needed for mild constipation or moderate constipation.   Cholecalciferol  (VITAMIN D3) 50 MCG (2000 UT) capsule Take 2,000 Units by mouth daily.   droxidopa (NORTHERA) 100 MG CAPS Take 1 capsule (100 mg total) by mouth 3 (three) times daily with meals for 9 days.   fludrocortisone  (FLORINEF ) 0.1 MG tablet Take 1 tablet (0.1 mg total) by mouth 2 (two) times daily.   FLUoxetine  (PROZAC ) 20 MG capsule Take 1 capsule by mouth once daily   ipratropium-albuterol  (DUONEB) 0.5-2.5 (3) MG/3ML SOLN Take 3 mLs by nebulization every 6 (six) hours as needed.   iron  polysaccharides (NIFEREX) 150 MG capsule Take 1 capsule (150 mg total) by mouth daily.   levETIRAcetam  (KEPPRA ) 250 MG tablet Take 250 mg by mouth 2 (two) times daily.    midodrine  (PROAMATINE ) 10 MG tablet Take 1 tablet (10 mg total) by mouth 3 (three)  times daily with meals.   Nebulizers (COMPRESSOR/NEBULIZER) MISC For use with medication for the lungs.  Use as directed.   ondansetron  (ZOFRAN ) 8 MG tablet Take 1 tablet (8 mg total) by mouth every 8 (eight) hours as needed for nausea or vomiting.   OXYGEN  Inhale 4 L into the lungs continuous.   pantoprazole  (PROTONIX ) 40 MG tablet Take 1 tablet (40 mg total) by mouth daily.   potassium chloride  (KLOR-CON ) 10 MEQ tablet Take 3 tablets (30 mEq total) by mouth daily.   pyridostigmine  (MESTINON ) 60 MG tablet Take 1 tablet (60 mg total) by mouth 3 (three) times daily.   sodium chloride  1 g tablet Take 1 tablet (1 g total) by mouth 2 (two) times daily with a meal.   zolpidem  (AMBIEN ) 10 MG tablet Take 5-10 mg by mouth at bedtime as needed.   No facility-administered encounter medications on file as of 12/15/2023.     Review of Systems  Constitutional: Negative.   HENT: Negative.    Eyes: Negative.   Respiratory: Negative.    Cardiovascular: Negative.   Gastrointestinal: Negative.   Endocrine: Negative.   Genitourinary: Negative.   Musculoskeletal: Negative.   Skin: Negative.   Allergic/Immunologic: Negative.   Neurological: Negative.   Hematological: Negative.   Psychiatric/Behavioral: Negative.    All other systems reviewed and are negative.    Immunization History  Administered Date(s) Administered    sv, Bivalent, Protein Subunit Rsvpref,pf (Abrysvo) 12/31/2022   Fluad Quad(high Dose 65+) 10/24/2020   INFLUENZA, HIGH DOSE SEASONAL PF 11/04/2018, 11/22/2019, 12/31/2022   Influenza Whole 02/24/2005   Influenza,inj,Quad PF,6+ Mos 12/03/2012, 12/09/2013, 01/16/2015, 02/06/2016, 11/04/2016, 12/08/2017   Influenza-Unspecified 10/25/2021, 12/15/2023   PFIZER Comirnaty(Gray Top)Covid-19 Tri-Sucrose Vaccine 07/06/2020   PFIZER(Purple Top)SARS-COV-2 Vaccination 02/28/2019, 03/21/2019, 11/14/2019   PNEUMOCOCCAL CONJUGATE-20 07/06/2020   Pneumococcal Conjugate-13 07/11/2014    Pneumococcal Polysaccharide-23 03/21/2005, 07/26/2015   Td 01/09/1999, 06/14/2007   Zoster, Live 09/07/2015   Pertinent  Health Maintenance Due  Topic Date Due   Mammogram  01/06/2024   Colonoscopy  03/24/2024 (Originally 08/18/2021)   Influenza Vaccine  Completed   DEXA SCAN  Completed      10/08/2022   11:38 AM 03/06/2023    1:12 PM 03/25/2023    2:04 PM 05/08/2023   11:10 AM 10/29/2023   12:09 PM  Fall Risk  Falls in the past year? 1 0 0 0 1  Was there an injury  with Fall? 1 0 0 0 1  Fall Risk Category Calculator 2 0 0 0 3  Patient at Risk for Falls Due to  No Fall Risks No Fall Risks No Fall Risks History of fall(s)  Fall risk Follow up Falls evaluation completed Education provided;Falls prevention discussed Falls evaluation completed Falls evaluation completed Falls evaluation completed   Functional Status Survey:     Vitals:   12/15/23 1453  BP: (!) 168/88  Pulse: 61  Resp: 17  Temp: (!) 97.1 F (36.2 C)  SpO2: 94%  Weight: 186 lb (84.4 kg)  Height: 5' 9 (1.753 m)   Body mass index is 27.47 kg/m. Physical Exam Vitals and nursing note reviewed.  Constitutional:      General: She is not in acute distress.    Appearance: She is not toxic-appearing or diaphoretic.  HENT:     Head: Normocephalic and atraumatic.  Cardiovascular:     Rate and Rhythm: Normal rate and regular rhythm.  Pulmonary:     Effort: Pulmonary effort is normal. No respiratory distress.     Comments: Diminished BS bilaterally No distress Musculoskeletal:     Right lower leg: No edema.     Left lower leg: No edema.     Comments: Wearing knee high compression stockings  Skin:    General: Skin is warm and dry.     Capillary Refill: Capillary refill takes less than 2 seconds.  Neurological:     Mental Status: She is alert and oriented to person, place, and time.      Labs reviewed: Recent Labs    09/02/23 0507 09/03/23 0458 09/04/23 0536 09/05/23 0625 09/26/23 0520 09/28/23 0553  12/04/23 1545 12/05/23 0552 12/06/23 1259 12/07/23 0326  NA 134* 134* 133*   < > 138   < > 133* 137  --  138  K 4.1 4.2 4.3   < > 2.9*   < > 3.3* 3.0*  --  3.8  CL 104 101 101   < > 102   < > 98 102  --  102  CO2 22 21* 22   < > 27   < > 25 27  --  27  GLUCOSE 105* 108* 110*   < > 119*   < > 105* 101*  --  100*  BUN 14 19 14    < > 13   < > 20 16  --  15  CREATININE 0.82 0.74 0.87   < > 0.64   < > 0.98 0.78  --  0.70  CALCIUM  9.0 9.0 8.9   < > 8.6*   < > 8.9 8.9  --  8.9  MG 2.0 2.0 2.0  --  1.7  --   --   --  2.0  --   PHOS 3.0 3.5 3.3  --   --   --   --   --   --   --    < > = values in this interval not displayed.   Recent Labs    09/25/23 2305 11/18/23 1407 11/23/23 0926  AST 17 20 26   ALT 12 13 17   ALKPHOS 83 92 96  BILITOT 0.9 0.9 2.0*  PROT 7.8 7.6 8.5*  ALBUMIN 3.9 4.0 4.5   Recent Labs    10/29/23 1246 11/06/23 1353 11/18/23 1407 11/23/23 0926 11/24/23 0555 12/03/23 1243 12/04/23 0455 12/07/23 0326  WBC 9.1 8.2   < > 14.7*   < > 10.7* 7.7 6.3  NEUTROABS 7.4  6.4  --  12.4*  --   --   --   --   HGB 10.7* 10.8*   < > 14.3   < > 12.9 11.6* 10.8*  HCT 32.8* 33.2*   < > 45.6   < > 40.2 36.9 34.1*  MCV 84.4 84.1   < > 83.8   < > 83.2 83.5 82.8  PLT 313.0 397.0   < > 421*   < > 289 291 330   < > = values in this interval not displayed.   Lab Results  Component Value Date   TSH 1.764 11/30/2023   Lab Results  Component Value Date   HGBA1C 6.2 03/18/2023   Lab Results  Component Value Date   CHOL 156 03/18/2023   HDL 64.10 03/18/2023   LDLCALC 72 03/18/2023   TRIG 98.0 03/18/2023   CHOLHDL 2 03/18/2023     Significant Diagnostic Results in last 30 days: MR BRAIN W WO CONTRAST Result Date: 12/03/2023 CLINICAL DATA:  Headache, increasing frequency or severity Headache, new onset (Age >= 51y). Recurrent weakness, hypotension, dizziness, and weight loss. EXAM: MRI HEAD WITHOUT AND WITH CONTRAST TECHNIQUE: Multiplanar, multiecho pulse sequences of the brain  and surrounding structures were obtained without and with intravenous contrast. CONTRAST:  8mL GADAVIST GADOBUTROL 1 MMOL/ML IV SOLN COMPARISON:  Head CT 09/14/2023 FINDINGS: Brain: There is no evidence of an acute infarct, midline shift, hydrocephalus, or extra-axial fluid collection. A few scattered chronic microhemorrhages are noted in the cerebral hemispheres and brainstem, nonspecific. Patchy T2 hyperintensities in the cerebral white matter bilaterally are nonspecific but compatible with moderate chronic small vessel ischemic disease. There is an approximately 2.5 cm region of mild hazy T2 FLAIR hyperintensity in the inferior left occipital lobe involving cortex and juxtacortical white matter with blurring of the gray-white junction, possible slight gyral expansion, and no associated enhancement or diffusion abnormality (series 9, image 19 and series 15, image 7). Mild cerebral atrophy is within normal limits for age. No abnormal intracranial enhancement is identified. Vascular: Major intracranial vascular flow voids are preserved. Skull and upper cervical spine: Unremarkable bone marrow signal. Sinuses/Orbits: Bilateral cataract extraction. Paranasal sinuses and mastoid air cells are clear. Other: None. IMPRESSION: 1. 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 Short-term MRI follow-up is recommended. 2. Moderate chronic small vessel ischemic disease. Electronically Signed   By: Dasie Hamburg M.D.   On: 12/03/2023 20:20   DG Chest Portable 1 View Result Date: 11/23/2023 CLINICAL DATA:  73 year old female with weakness. EXAM: PORTABLE CHEST 1 VIEW COMPARISON:  Chest radiographs 10/29/2023 and earlier. FINDINGS: Portable AP upright view at 1034 hours. Emphysema by CTA in July. Chronic curvilinear scarring at the level of the right hilum appears unchanged since  that time. Streaky left perihilar opacity is increased from the July CTA, but regressed from earlier this month. Stable left chest pacemaker. Stable cardiac size and mediastinal contours. Calcified aortic atherosclerosis. Visualized tracheal air column is within normal limits. No pneumothorax or pulmonary edema. No pleural effusion or other confluent lung opacity. IMPRESSION: Emphysema (ICD10-J43.9). Streaky left perihilar lung opacity regressed but not fully resolved since 10/29/2023. Consider unresolved infectious exacerbation. Stable curvilinear right perihilar lung scarring since July. Electronically Signed   By: VEAR Hurst M.D.   On: 11/23/2023 10:59     Assessment & Plan DNR (do not resuscitate) Patient is currently alert and oriented  x 4.  Competent to make her own medical decisions.  Verified that she does want to be a DO NOT RESUSCITATE/DO NOT INTUBATE.  I then called her husband Tabbatha Bordelon and spoke with him directly.  He agrees with the patient's decision.  Will change her CODE STATUS to DO NOT RESUSCITATE.      Chronic orthostatic hypotension Blood pressures currently running higher than desired with systolic blood pressures in the 180s and 90s at times.  No chest pain.  Will discontinue her fludrocortisone  for now.  She will remain on droxidopa 100 mg 3 times daily with meals, midodrine  10 mg 3 times daily with meals, Mestinon  60 mg 3 times daily.  While she was in the hospital, she had a autoimmune dysautonomia panel sent to [facility].  Results are still pending.        Camellia Door, DO [facility] [phone]

## 2023-12-15 NOTE — Assessment & Plan Note (Addendum)
 Patient is currently alert and oriented x 4.  Competent to make her own medical decisions.  Verified that she does want to be a DO NOT RESUSCITATE/DO NOT INTUBATE.  I then called her husband Melva Faux and spoke with him directly.  He agrees with the patient's decision.  Will change her CODE STATUS to DO NOT RESUSCITATE.

## 2023-12-15 NOTE — Assessment & Plan Note (Addendum)
 Blood pressures currently running higher than desired with systolic blood pressures in the 180s and 90s at times.  No chest pain.  Will discontinue her fludrocortisone  for now.  She will remain on droxidopa 100 mg 3 times daily with meals, midodrine  10 mg 3 times daily with meals, Mestinon  60 mg 3 times daily.  While she was in the hospital, she had a autoimmune dysautonomia panel sent to [facility].  Results are still pending.

## 2023-12-16 ENCOUNTER — Ambulatory Visit: Payer: Self-pay | Admitting: Cardiology

## 2023-12-16 ENCOUNTER — Non-Acute Institutional Stay (SKILLED_NURSING_FACILITY): Admitting: Orthopedic Surgery

## 2023-12-16 ENCOUNTER — Encounter: Payer: Self-pay | Admitting: Orthopedic Surgery

## 2023-12-16 DIAGNOSIS — M25511 Pain in right shoulder: Secondary | ICD-10-CM

## 2023-12-16 LAB — CUP PACEART REMOTE DEVICE CHECK
Battery Remaining Longevity: 120 mo
Battery Remaining Percentage: 100 %
Brady Statistic RA Percent Paced: 18 %
Brady Statistic RV Percent Paced: 100 %
Date Time Interrogation Session: 20251020132400
Implantable Lead Connection Status: 753985
Implantable Lead Connection Status: 753985
Implantable Lead Implant Date: 20130830
Implantable Lead Implant Date: 20130830
Implantable Lead Location: 753859
Implantable Lead Location: 753860
Implantable Lead Model: 4456
Implantable Lead Model: 4479
Implantable Lead Serial Number: 473325
Implantable Lead Serial Number: 523784
Implantable Pulse Generator Implant Date: 20211011
Lead Channel Impedance Value: 417 Ohm
Lead Channel Impedance Value: 591 Ohm
Lead Channel Pacing Threshold Amplitude: 0.4 V
Lead Channel Pacing Threshold Pulse Width: 0.4 ms
Lead Channel Setting Pacing Amplitude: 2 V
Lead Channel Setting Pacing Amplitude: 2 V
Lead Channel Setting Pacing Pulse Width: 0.4 ms
Lead Channel Setting Sensing Sensitivity: 2.5 mV
Pulse Gen Serial Number: 949303
Zone Setting Status: 755011

## 2023-12-16 NOTE — Progress Notes (Signed)
 Location:  Other [facility].  Nursing Home Room Number: [facility] [id number] Place of Service:  SNF [phone]) Provider:  Greig Cluster, NP  PCP: Randeen Laine LABOR, MD  Patient Care Team: Tower, Laine LABOR, MD as PCP - General (Family Medicine) End, Lonni, MD as PCP - Cardiology (Cardiology) Cindie Ole DASEN, MD as PCP - Electrophysiology (Clinical Cardiac Electrophysiology) Carolee Manus DASEN., MD as Referring Physician (Ophthalmology) Tower, Laine LABOR, MD as Consulting Physician (Family Medicine) Riddle, Suzann, NP as Nurse Practitioner (Clinical Cardiac Electrophysiology)  Extended Emergency Contact Information Primary Emergency Contact: Meriweather,William T Address: [address]          [place], [place] [address] [place] Mobile Phone: [phone] Relation: Spouse  Code Status:  DNR Goals of care: Advanced Directive information    12/15/2023    3:12 PM  Advanced Directives  Does Patient Have a Medical Advance Directive? Yes  Type of Advance Directive Out of facility DNR (pink MOST or yellow form)  Does patient want to make changes to medical advance directive? No - Guardian declined     Chief Complaint  Patient presents with   Shoulder Pain    Shoulder Pain    HPI:  Pt is a 73 y.o. female seen today for acute visit due to right shoulder pain.   She currently resides on the skilled nursing unit at [facility]. PMH: carotid stenosis, HLD, CHF, hypotension, CHB s/p pacemaker, PAF, chronic respiratory failure, right breast cancer s/p radiation and lumpectomy, COPD, lung cancer, GERD, osteoporosis, anemia, depression and seizures.   She reports increased right shoulder pain x 2 days. H/o right rotator cuff tear/ proximal humerus fracture. She was given tylenol  500 mg prn and she continued to have pain. Today, she reports pain is limiting right arm movement. Afebrile. Vitals stable.      Past Medical History:  Diagnosis Date   Allergic rhinitis    Arthritis    Asthma     as a child, mild now   Breast cancer (HCC) 12/2015   right breast cancer, lumpectomy and mammosite    Cardiac pacemaker in situ    Colon polyps    colonoscopy 7/08, tubular adenoma   Complete heart block (HCC) 09/2011   s/p PPM implanted in [facility]   COPD (chronic obstructive pulmonary disease) (HCC)    Myocardial infarction (HCC) 2011   Orthostatic hypotension    Osteopenia 10/2015   Pacemaker    2011   Paroxysmal atrial fibrillation (HCC) 03/2021   Incidentally detected on pacemaker interrogation   Personal history of radiation therapy 2017   right breast ca, mammosite placed   Rotator cuff tear, right 02/2022   Had an MRI in [place] I have 2 tears.   Seizure disorder (HCC)    Seizures (HCC)    first one was when she was 73 years old    Small bowel obstruction (HCC)    1988 and 2002   Tobacco abuse    Past Surgical History:  Procedure Laterality Date   ABDOMINAL HYSTERECTOMY     BREAST BIOPSY Right 2007   benign inflammatory changes, mass due to underwire bra   BREAST BIOPSY Left 01/02/2016   columnar cell changes without atypical hyperplasia.   BREAST BIOPSY Right 12/06/2015   rt breast mass 10:00, bx done at Dr. Gilberto office, invasive ductal carcinoma   BREAST EXCISIONAL BIOPSY Left 01/02/2016   COLUMNAR CELL CHANGE AND HYPERPLASIA ASSOCIATED WITH LUMINAL AND STROMAL CALCIFICATIONS   BREAST LUMPECTOMY Right  01/02/2016   invasive mammary carcinoma, clear margins, negative LN   BREAST LUMPECTOMY WITH SENTINEL LYMPH NODE BIOPSY Right 01/02/2016   pT1c, N0; ER/ PR 100%; Her 2 neu not over expressed: BREAST LUMPECTOMY WITH SENTINEL LYMPH NODE BX;  Surgeon: Reyes LELON Cota, MD;  Location: [facility] ORS;  Service: General;  Laterality: Right;   CARDIAC CATHETERIZATION     CAROTID PTA/STENT INTERVENTION Right 04/29/2022   Procedure: CAROTID PTA/STENT INTERVENTION;  Surgeon: Jama Cordella MATSU, MD;  Location: [facility] INVASIVE CV LAB;  Service: Cardiovascular;  Laterality:  Right;   CATARACT EXTRACTION Bilateral    COLONOSCOPY  10/2015   Dr Teressa   COLONOSCOPY WITH PROPOFOL  N/A 08/19/2018   Procedure: COLONOSCOPY WITH PROPOFOL ;  Surgeon: Janalyn Keene NOVAK, MD;  Location: [facility] ENDOSCOPY;  Service: Endoscopy;  Laterality: N/A;   ESOPHAGOGASTRODUODENOSCOPY (EGD) WITH PROPOFOL  N/A 08/19/2018   Procedure: ESOPHAGOGASTRODUODENOSCOPY (EGD) WITH PROPOFOL ;  Surgeon: Janalyn Keene NOVAK, MD;  Location: [facility] ENDOSCOPY;  Service: Endoscopy;  Laterality: N/A;   EXPLORATORY LAPAROTOMY  01/25/2001   Exploratory laparotomy, lysis of adhesions, identification of internal hernia secondary to omental adhesion. Prolonged postoperative ileus.   gyn surgery  1993   hysterectomy- form endometriosis   LAPAROSCOPY     PACEMAKER INSERTION  10/24/2011   [employer]io dual chamber PPM implanted by Dr Ronny at [facility] in [place]   PPM GENERATOR CHANGEOUT N/A 12/05/2019   Procedure: PPM GENERATOR HERMA;  Surgeon: Fernande Elspeth BROCKS, MD;  Location: [facility] INVASIVE CV LAB;  Service: Cardiovascular;  Laterality: N/A;   RIGHT/LEFT HEART CATH AND CORONARY ANGIOGRAPHY Bilateral 07/19/2019   Procedure: RIGHT/LEFT HEART CATH AND CORONARY ANGIOGRAPHY;  Surgeon: Mady Bruckner, MD;  Location: [facility] INVASIVE CV LAB;  Service: Cardiovascular;  Laterality: Bilateral;   TEMPORARY PACEMAKER N/A 12/05/2019   Procedure: TEMPORARY PACEMAKER;  Surgeon: Fernande Elspeth BROCKS, MD;  Location: [facility] INVASIVE CV LAB;  Service: Cardiovascular;  Laterality: N/A;    Allergies  Allergen Reactions   Codeine Nausea And Vomiting   Morphine  And Codeine Nausea Only    Outpatient Encounter Medications as of 12/16/2023  Medication Sig   acetaminophen  (TYLENOL ) 500 MG tablet Take 500-1,000 mg by mouth every 6 (six) hours as needed for mild pain or headache.    albuterol  (VENTOLIN  HFA) 108 (90 Base) MCG/ACT inhaler INHALE 2 PUFFS BY MOUTH EVERY 4 HOURS AS NEEDED FOR WHEEZING OR  SHORTNESS OF BREATH   apixaban  (ELIQUIS ) 5 MG TABS tablet Take 1 tablet (5 mg total) by mouth 2 (two) times daily.   aspirin  EC 81 MG tablet Take 1 tablet (81 mg total) by mouth daily.   atorvastatin  (LIPITOR) 40 MG tablet Take 1 tablet by mouth once daily   benzonatate  (TESSALON ) 200 MG capsule Take 1 capsule (200 mg total) by mouth 2 (two) times daily as needed for cough.   bisacodyl  (DULCOLAX) 10 MG suppository Place 1 suppository (10 mg total) rectally daily as needed for mild constipation or moderate constipation.   Cholecalciferol  (VITAMIN D3) 50 MCG (2000 UT) capsule Take 2,000 Units by mouth daily.   droxidopa (NORTHERA) 100 MG CAPS Take 1 capsule (100 mg total) by mouth 3 (three) times daily with meals for 9 days.   fludrocortisone  (FLORINEF ) 0.1 MG tablet Take 1 tablet (0.1 mg total) by mouth 2 (two) times daily.   FLUoxetine  (PROZAC ) 20 MG capsule Take 1 capsule by mouth once daily   ipratropium-albuterol  (DUONEB) 0.5-2.5 (3) MG/3ML SOLN Take 3 mLs by nebulization every 6 (six)  hours as needed.   iron  polysaccharides (NIFEREX) 150 MG capsule Take 1 capsule (150 mg total) by mouth daily.   levETIRAcetam  (KEPPRA ) 250 MG tablet Take 250 mg by mouth 2 (two) times daily.    lidocaine  4 % Place 1 patch onto the skin daily.   midodrine  (PROAMATINE ) 10 MG tablet Take 1 tablet (10 mg total) by mouth 3 (three) times daily with meals.   Nebulizers (COMPRESSOR/NEBULIZER) MISC For use with medication for the lungs.  Use as directed.   ondansetron  (ZOFRAN ) 8 MG tablet Take 1 tablet (8 mg total) by mouth every 8 (eight) hours as needed for nausea or vomiting.   OXYGEN  Inhale 4 L into the lungs continuous.   pantoprazole  (PROTONIX ) 40 MG tablet Take 1 tablet (40 mg total) by mouth daily.   potassium chloride  (KLOR-CON ) 10 MEQ tablet Take 3 tablets (30 mEq total) by mouth daily.   pyridostigmine  (MESTINON ) 60 MG tablet Take 1 tablet (60 mg total) by mouth 3 (three) times daily.   sodium chloride  1 g  tablet Take 1 tablet (1 g total) by mouth 2 (two) times daily with a meal.   zolpidem  (AMBIEN ) 10 MG tablet Take 5-10 mg by mouth at bedtime as needed.   No facility-administered encounter medications on file as of 12/16/2023.    Review of Systems  Constitutional:  Negative for fatigue.  Respiratory:  Negative for shortness of breath.   Cardiovascular:  Negative for chest pain.  Musculoskeletal:  Positive for arthralgias and gait problem.  Skin:  Negative for wound.  Psychiatric/Behavioral:  Negative for dysphoric mood. The patient is not nervous/anxious.     Immunization History  Administered Date(s) Administered    sv, Bivalent, Protein Subunit Rsvpref,pf (Abrysvo) 12/31/2022   Fluad Quad(high Dose 65+) 10/24/2020   INFLUENZA, HIGH DOSE SEASONAL PF 11/04/2018, 11/22/2019, 12/31/2022   Influenza Whole 02/24/2005   Influenza,inj,Quad PF,6+ Mos 12/03/2012, 12/09/2013, 01/16/2015, 02/06/2016, 11/04/2016, 12/08/2017   Influenza-Unspecified 10/25/2021, 12/15/2023   PFIZER Comirnaty(Gray Top)Covid-19 Tri-Sucrose Vaccine 07/06/2020   PFIZER(Purple Top)SARS-COV-2 Vaccination 02/28/2019, 03/21/2019, 11/14/2019   PNEUMOCOCCAL CONJUGATE-20 07/06/2020   Pneumococcal Conjugate-13 07/11/2014   Pneumococcal Polysaccharide-23 03/21/2005, 07/26/2015   Td 01/09/1999, 06/14/2007   Zoster, Live 09/07/2015   Pertinent  Health Maintenance Due  Topic Date Due   Mammogram  01/06/2024   Colonoscopy  03/24/2024 (Originally 08/18/2021)   Influenza Vaccine  Completed   DEXA SCAN  Completed      10/08/2022   11:38 AM 03/06/2023    1:12 PM 03/25/2023    2:04 PM 05/08/2023   11:10 AM 10/29/2023   12:09 PM  Fall Risk  Falls in the past year? 1 0 0 0 1  Was there an injury with Fall? 1 0 0 0 1  Fall Risk Category Calculator 2 0 0 0 3  Patient at Risk for Falls Due to  No Fall Risks No Fall Risks No Fall Risks History of fall(s)  Fall risk Follow up Falls evaluation completed Education provided;Falls  prevention discussed Falls evaluation completed Falls evaluation completed Falls evaluation completed   Functional Status Survey:    Vitals:   12/16/23 1404  BP: (!) 169/80  Pulse: 81  Resp: 18  Temp: (!) 97.2 F (36.2 C)  SpO2: 97%  Weight: 185 lb 3.2 oz (84 kg)  Height: 5' 9 (1.753 m)   Body mass index is 27.35 kg/m. Physical Exam Vitals reviewed.  Constitutional:      General: She is not in acute distress. HENT:  Head: Normocephalic.  Eyes:     General:        Right eye: No discharge.        Left eye: No discharge.  Cardiovascular:     Rate and Rhythm: Normal rate and regular rhythm.     Pulses: Normal pulses.     Heart sounds: Normal heart sounds.  Pulmonary:     Effort: Pulmonary effort is normal.     Breath sounds: Normal breath sounds.  Musculoskeletal:        General: Tenderness present.     Right shoulder: Tenderness present. No swelling, deformity or crepitus. Decreased range of motion. Normal strength. Normal pulse.     Cervical back: Neck supple.  Skin:    General: Skin is warm.     Capillary Refill: Capillary refill takes less than 2 seconds.  Neurological:     General: No focal deficit present.     Mental Status: She is alert.     Gait: Gait abnormal.  Psychiatric:        Mood and Affect: Mood normal.     Labs reviewed: Recent Labs    09/02/23 0507 09/03/23 0458 09/04/23 0536 09/05/23 0625 09/26/23 0520 09/28/23 0553 12/04/23 1545 12/05/23 0552 12/06/23 1259 12/07/23 0326  NA 134* 134* 133*   < > 138   < > 133* 137  --  138  K 4.1 4.2 4.3   < > 2.9*   < > 3.3* 3.0*  --  3.8  CL 104 101 101   < > 102   < > 98 102  --  102  CO2 22 21* 22   < > 27   < > 25 27  --  27  GLUCOSE 105* 108* 110*   < > 119*   < > 105* 101*  --  100*  BUN 14 19 14    < > 13   < > 20 16  --  15  CREATININE 0.82 0.74 0.87   < > 0.64   < > 0.98 0.78  --  0.70  CALCIUM  9.0 9.0 8.9   < > 8.6*   < > 8.9 8.9  --  8.9  MG 2.0 2.0 2.0  --  1.7  --   --   --  2.0   --   PHOS 3.0 3.5 3.3  --   --   --   --   --   --   --    < > = values in this interval not displayed.   Recent Labs    09/25/23 2305 11/18/23 1407 11/23/23 0926  AST 17 20 26   ALT 12 13 17   ALKPHOS 83 92 96  BILITOT 0.9 0.9 2.0*  PROT 7.8 7.6 8.5*  ALBUMIN 3.9 4.0 4.5   Recent Labs    10/29/23 1246 11/06/23 1353 11/18/23 1407 11/23/23 0926 11/24/23 0555 12/03/23 1243 12/04/23 0455 12/07/23 0326  WBC 9.1 8.2   < > 14.7*   < > 10.7* 7.7 6.3  NEUTROABS 7.4 6.4  --  12.4*  --   --   --   --   HGB 10.7* 10.8*   < > 14.3   < > 12.9 11.6* 10.8*  HCT 32.8* 33.2*   < > 45.6   < > 40.2 36.9 34.1*  MCV 84.4 84.1   < > 83.8   < > 83.2 83.5 82.8  PLT 313.0 397.0   < > 421*   < > 289  291 330   < > = values in this interval not displayed.   Lab Results  Component Value Date   TSH 1.764 11/30/2023   Lab Results  Component Value Date   HGBA1C 6.2 03/18/2023   Lab Results  Component Value Date   CHOL 156 03/18/2023   HDL 64.10 03/18/2023   LDLCALC 72 03/18/2023   TRIG 98.0 03/18/2023   CHOLHDL 2 03/18/2023    Significant Diagnostic Results in last 30 days:  CUP PACEART REMOTE DEVICE CHECK Result Date: 12/16/2023 PPM Scheduled remote reviewed. Normal device function.  Presenting rhythm: AS/VP Next remote transmission per protocol. LA, CVRS  MR BRAIN W WO CONTRAST Result Date: 12/03/2023 CLINICAL DATA:  Headache, increasing frequency or severity Headache, new onset (Age >= 51y). Recurrent weakness, hypotension, dizziness, and weight loss. EXAM: MRI HEAD WITHOUT AND WITH CONTRAST TECHNIQUE: Multiplanar, multiecho pulse sequences of the brain and surrounding structures were obtained without and with intravenous contrast. CONTRAST:  8mL GADAVIST GADOBUTROL 1 MMOL/ML IV SOLN COMPARISON:  Head CT 09/14/2023 FINDINGS: Brain: There is no evidence of an acute infarct, midline shift, hydrocephalus, or extra-axial fluid collection. A few scattered chronic microhemorrhages are noted in  the cerebral hemispheres and brainstem, nonspecific. Patchy T2 hyperintensities in the cerebral white matter bilaterally are nonspecific but compatible with moderate chronic small vessel ischemic disease. There is an approximately 2.5 cm region of mild hazy T2 FLAIR hyperintensity in the inferior left occipital lobe involving cortex and juxtacortical white matter with blurring of the gray-white junction, possible slight gyral expansion, and no associated enhancement or diffusion abnormality (series 9, image 19 and series 15, image 7). Mild cerebral atrophy is within normal limits for age. No abnormal intracranial enhancement is identified. Vascular: Major intracranial vascular flow voids are preserved. Skull and upper cervical spine: Unremarkable bone marrow signal. Sinuses/Orbits: Bilateral cataract extraction. Paranasal sinuses and mastoid air cells are clear. Other: None. IMPRESSION: 1. 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 Short-term MRI follow-up is recommended. 2. Moderate chronic small vessel ischemic disease. Electronically Signed   By: Dasie Hamburg M.D.   On: 12/03/2023 20:20   DG Chest Portable 1 View Result Date: 11/23/2023 CLINICAL DATA:  73 year old female with weakness. EXAM: PORTABLE CHEST 1 VIEW COMPARISON:  Chest radiographs 10/29/2023 and earlier. FINDINGS: Portable AP upright view at 1034 hours. Emphysema by CTA in July. Chronic curvilinear scarring at the level of the right hilum appears unchanged since that time. Streaky left perihilar opacity is increased from the July CTA, but regressed from earlier this month. Stable left chest pacemaker. Stable cardiac size and mediastinal contours. Calcified aortic atherosclerosis. Visualized tracheal air column is within normal limits. No pneumothorax or pulmonary edema. No pleural effusion or other  confluent lung opacity. IMPRESSION: Emphysema (ICD10-J43.9). Streaky left perihilar lung opacity regressed but not fully resolved since 10/29/2023. Consider unresolved infectious exacerbation. Stable curvilinear right perihilar lung scarring since July. Electronically Signed   By: VEAR Hurst M.D.   On: 11/23/2023 10:59    Assessment/Plan: 1. Acute pain of right shoulder (Primary) - increased pain x 2 days - h/o right rotator cuff tear - limited ROM, tenderness, no deformity on exam - start tylenol  1000 mg po BID - start lidocaine  4% patch daily  - consider xray and voltaren  gel if no improvement    Family/ staff Communication: plan discussed with patient   Labs/tests ordered:  none

## 2023-12-18 NOTE — Progress Notes (Signed)
 Remote PPM Transmission

## 2023-12-22 ENCOUNTER — Other Ambulatory Visit: Payer: Self-pay | Admitting: Nurse Practitioner

## 2023-12-22 MED ORDER — ZOLPIDEM TARTRATE 10 MG PO TABS: 5.0000 mg | ORAL_TABLET | Freq: Every evening | ORAL | 0 refills | Status: DC | PRN

## 2023-12-22 NOTE — Telephone Encounter (Signed)
 Rx sent to [facility] POUR, NP to sign pending for approval

## 2023-12-24 ENCOUNTER — Non-Acute Institutional Stay (SKILLED_NURSING_FACILITY): Payer: Self-pay | Admitting: Nurse Practitioner

## 2023-12-24 ENCOUNTER — Encounter: Payer: Self-pay | Admitting: Nurse Practitioner

## 2023-12-24 DIAGNOSIS — I5032 Chronic diastolic (congestive) heart failure: Secondary | ICD-10-CM

## 2023-12-24 DIAGNOSIS — J9611 Chronic respiratory failure with hypoxia: Secondary | ICD-10-CM | POA: Diagnosis not present

## 2023-12-24 DIAGNOSIS — M25511 Pain in right shoulder: Secondary | ICD-10-CM

## 2023-12-24 DIAGNOSIS — K219 Gastro-esophageal reflux disease without esophagitis: Secondary | ICD-10-CM

## 2023-12-24 DIAGNOSIS — I951 Orthostatic hypotension: Secondary | ICD-10-CM

## 2023-12-24 DIAGNOSIS — R569 Unspecified convulsions: Secondary | ICD-10-CM

## 2023-12-24 DIAGNOSIS — E611 Iron deficiency: Secondary | ICD-10-CM

## 2023-12-24 DIAGNOSIS — G8929 Other chronic pain: Secondary | ICD-10-CM

## 2023-12-24 DIAGNOSIS — F32A Depression, unspecified: Secondary | ICD-10-CM

## 2023-12-24 DIAGNOSIS — I480 Paroxysmal atrial fibrillation: Secondary | ICD-10-CM

## 2023-12-24 DIAGNOSIS — J432 Centrilobular emphysema: Secondary | ICD-10-CM

## 2023-12-24 DIAGNOSIS — E871 Hypo-osmolality and hyponatremia: Secondary | ICD-10-CM

## 2023-12-24 DIAGNOSIS — E7800 Pure hypercholesterolemia, unspecified: Secondary | ICD-10-CM

## 2023-12-24 DIAGNOSIS — D509 Iron deficiency anemia, unspecified: Secondary | ICD-10-CM

## 2023-12-24 MED ORDER — SODIUM CHLORIDE 1 G PO TABS: 1.0000 g | ORAL_TABLET | Freq: Two times a day (BID) | ORAL | 0 refills | Status: DC

## 2023-12-24 MED ORDER — LEVETIRACETAM 250 MG PO TABS: 250.0000 mg | ORAL_TABLET | Freq: Two times a day (BID) | ORAL | 0 refills | Status: AC

## 2023-12-24 MED ORDER — FLUOXETINE HCL 20 MG PO CAPS: 20.0000 mg | ORAL_CAPSULE | Freq: Every day | ORAL | 0 refills | Status: DC

## 2023-12-24 MED ORDER — APIXABAN 5 MG PO TABS: 5.0000 mg | ORAL_TABLET | Freq: Two times a day (BID) | ORAL | 0 refills | Status: AC

## 2023-12-24 MED ORDER — PANTOPRAZOLE SODIUM 40 MG PO TBEC: 40.0000 mg | DELAYED_RELEASE_TABLET | Freq: Every day | ORAL | 0 refills | Status: DC

## 2023-12-24 MED ORDER — POLYSACCHARIDE IRON COMPLEX 150 MG PO CAPS: 150.0000 mg | ORAL_CAPSULE | Freq: Every day | ORAL | 0 refills | Status: DC

## 2023-12-24 MED ORDER — DROXIDOPA 100 MG PO CAPS: 100.0000 mg | ORAL_CAPSULE | Freq: Three times a day (TID) | ORAL | 0 refills | Status: DC

## 2023-12-24 MED ORDER — ALBUTEROL SULFATE HFA 108 (90 BASE) MCG/ACT IN AERS
2.0000 | INHALATION_SPRAY | RESPIRATORY_TRACT | 0 refills | Status: AC | PRN
Start: 2023-12-24 — End: ?

## 2023-12-24 MED ORDER — PYRIDOSTIGMINE BROMIDE 60 MG PO TABS: 60.0000 mg | ORAL_TABLET | Freq: Three times a day (TID) | ORAL | 0 refills | Status: DC

## 2023-12-24 MED ORDER — MIDODRINE HCL 10 MG PO TABS
10.0000 mg | ORAL_TABLET | Freq: Three times a day (TID) | ORAL | 0 refills | Status: DC
Start: 2023-12-24 — End: 2023-12-29

## 2023-12-24 MED ORDER — LIDOCAINE 4 % EX PTCH: 1.0000 | MEDICATED_PATCH | CUTANEOUS | 0 refills | Status: AC

## 2023-12-24 MED ORDER — ATORVASTATIN CALCIUM 40 MG PO TABS: 40.0000 mg | ORAL_TABLET | Freq: Every day | ORAL | 0 refills | Status: DC

## 2023-12-24 MED ORDER — POTASSIUM CHLORIDE ER 10 MEQ PO TBCR: 30.0000 meq | EXTENDED_RELEASE_TABLET | Freq: Every day | ORAL | 0 refills | Status: DC

## 2023-12-24 NOTE — Progress Notes (Signed)
 Location:  Other [facility].  Nursing Home Room Number: [facility] [id number] Place of Service:  SNF [phone]) Harlene An ,NP  PCP: Randeen Laine LABOR, MD  Patient Care Team: Tower, Laine LABOR, MD as PCP - General (Family Medicine) End, Lonni, MD as PCP - Cardiology (Cardiology) Cindie Ole DASEN, MD as PCP - Electrophysiology (Clinical Cardiac Electrophysiology) Carolee Manus DASEN., MD as Referring Physician (Ophthalmology) Tower, Laine LABOR, MD as Consulting Physician (Family Medicine) Riddle, Suzann, NP as Nurse Practitioner (Clinical Cardiac Electrophysiology)  Extended Emergency Contact Information Primary Emergency Contact: Lemanski,William T Address: [address]          [place], [place] [address] [place] Mobile Phone: [phone] Relation: Spouse  Goals of care: Advanced Directive information    12/15/2023    3:12 PM  Advanced Directives  Does Patient Have a Medical Advance Directive? Yes  Type of Advance Directive Out of facility DNR (pink MOST or yellow form)  Does patient want to make changes to medical advance directive? No - Guardian declined     Chief Complaint  Patient presents with   Discharge Note    Discharge    HPI:  Pt is a 73 y.o. female seen today for discharge home.    She was hospitalized  from 10/07-1011 due to recurrent syncope and orthostatic hypotension. Her husband called EMS after she experienced a syncopal episode at home. 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 Neurology was consulted and the patient recalls being told it was not related to her syncope. She was dc to [facility] for rehab and now ready for dc home with home health services.   She continues to have some  dizziness when sitting up quickly. She is compliant with wearing an abdominal binder and compression hose, which she finds helpful. She uses walker for support and has husband at home.  She was seen for acute visit due to right shoulder pain-  hx of torn rotator cuff- not a surgery candidate and pain flared last week-doing better at this time using lidocaine  patch  She had elevated bp last week as well- fludrocortisone  was stopped and she was continued on midodrine  10 mg TID with meals and mestinon  60 mg TID.  She had an autoimmunue dysautonamia panel sent to [facility] clinic her PCP needs to follow up   Past Medical History:  Diagnosis Date   Allergic rhinitis    Arthritis    Asthma    as a child, mild now   Breast cancer (HCC) 12/2015   right breast cancer, lumpectomy and mammosite    Cardiac pacemaker in situ    Colon polyps    colonoscopy 7/08, tubular adenoma   Complete heart block (HCC) 09/2011   s/p PPM implanted in [place] [facility]   COPD (chronic obstructive pulmonary disease) (HCC)    Myocardial infarction (HCC) 2011   Orthostatic hypotension    Osteopenia 10/2015   Pacemaker    2011   Paroxysmal atrial fibrillation (HCC) 03/2021   Incidentally detected on pacemaker interrogation   Personal history of radiation therapy 2017   right breast ca, mammosite placed   Rotator cuff tear, right 02/2022   Had an MRI in [place] I have 2 tears.   Seizure disorder (HCC)    Seizures (HCC)    first one was when she was 73 years old    Small bowel obstruction (HCC)    1988 and 2002   Tobacco abuse  Past Surgical History:  Procedure Laterality Date   ABDOMINAL HYSTERECTOMY     BREAST BIOPSY Right 2007   benign inflammatory changes, mass due to underwire bra   BREAST BIOPSY Left 01/02/2016   columnar cell changes without atypical hyperplasia.   BREAST BIOPSY Right 12/06/2015   rt breast mass 10:00, bx done at Dr. Gilberto office, invasive ductal carcinoma   BREAST EXCISIONAL BIOPSY Left 01/02/2016   COLUMNAR CELL CHANGE AND HYPERPLASIA ASSOCIATED WITH LUMINAL AND STROMAL CALCIFICATIONS   BREAST LUMPECTOMY Right 01/02/2016   invasive mammary carcinoma, clear margins, negative LN   BREAST LUMPECTOMY WITH SENTINEL LYMPH  NODE BIOPSY Right 01/02/2016   pT1c, N0; ER/ PR 100%; Her 2 neu not over expressed: BREAST LUMPECTOMY WITH SENTINEL LYMPH NODE BX;  Surgeon: Reyes LELON Cota, MD;  Location: [facility] ORS;  Service: General;  Laterality: Right;   CARDIAC CATHETERIZATION     CAROTID PTA/STENT INTERVENTION Right 04/29/2022   Procedure: CAROTID PTA/STENT INTERVENTION;  Surgeon: Jama Cordella MATSU, MD;  Location: [facility] INVASIVE CV LAB;  Service: Cardiovascular;  Laterality: Right;   CATARACT EXTRACTION Bilateral    COLONOSCOPY  10/2015   Dr Teressa   COLONOSCOPY WITH PROPOFOL  N/A 08/19/2018   Procedure: COLONOSCOPY WITH PROPOFOL ;  Surgeon: Janalyn Keene NOVAK, MD;  Location: [facility] ENDOSCOPY;  Service: Endoscopy;  Laterality: N/A;   ESOPHAGOGASTRODUODENOSCOPY (EGD) WITH PROPOFOL  N/A 08/19/2018   Procedure: ESOPHAGOGASTRODUODENOSCOPY (EGD) WITH PROPOFOL ;  Surgeon: Janalyn Keene NOVAK, MD;  Location: [facility] ENDOSCOPY;  Service: Endoscopy;  Laterality: N/A;   EXPLORATORY LAPAROTOMY  01/25/2001   Exploratory laparotomy, lysis of adhesions, identification of internal hernia secondary to omental adhesion. Prolonged postoperative ileus.   gyn surgery  1993   hysterectomy- form endometriosis   LAPAROSCOPY     PACEMAKER INSERTION  10/24/2011   [employer] Advantio dual chamber PPM implanted by Dr Ronny at [facility] in [place]   PPM GENERATOR CHANGEOUT N/A 12/05/2019   Procedure: PPM GENERATOR HERMA;  Surgeon: Fernande Elspeth BROCKS, MD;  Location: [facility] INVASIVE CV LAB;  Service: Cardiovascular;  Laterality: N/A;   RIGHT/LEFT HEART CATH AND CORONARY ANGIOGRAPHY Bilateral 07/19/2019   Procedure: RIGHT/LEFT HEART CATH AND CORONARY ANGIOGRAPHY;  Surgeon: Mady Bruckner, MD;  Location: [facility] INVASIVE CV LAB;  Service: Cardiovascular;  Laterality: Bilateral;   TEMPORARY PACEMAKER N/A 12/05/2019   Procedure: TEMPORARY PACEMAKER;  Surgeon: Fernande Elspeth BROCKS, MD;  Location: [facility] INVASIVE CV LAB;  Service:  Cardiovascular;  Laterality: N/A;    Allergies  Allergen Reactions   Codeine Nausea And Vomiting   Morphine  And Codeine Nausea Only    Outpatient Encounter Medications as of 12/24/2023  Medication Sig   acetaminophen  (TYLENOL ) 500 MG tablet Take 1,000 mg by mouth in the morning and at bedtime.   albuterol  (VENTOLIN  HFA) 108 (90 Base) MCG/ACT inhaler INHALE 2 PUFFS BY MOUTH EVERY 4 HOURS AS NEEDED FOR WHEEZING OR SHORTNESS OF BREATH   apixaban  (ELIQUIS ) 5 MG TABS tablet Take 1 tablet (5 mg total) by mouth 2 (two) times daily.   aspirin  EC 81 MG tablet Take 1 tablet (81 mg total) by mouth daily.   atorvastatin  (LIPITOR) 40 MG tablet Take 1 tablet by mouth once daily   benzonatate  (TESSALON ) 200 MG capsule Take 1 capsule (200 mg total) by mouth 2 (two) times daily as needed for cough.   bisacodyl  (DULCOLAX) 10 MG suppository Place 1 suppository (10 mg total) rectally daily as needed for mild constipation or moderate constipation.   Cholecalciferol  (VITAMIN D3) 50  MCG (2000 UT) capsule Take 2,000 Units by mouth daily.   droxidopa (NORTHERA) 100 MG CAPS Take 100 mg by mouth 3 (three) times daily with meals.   FLUoxetine  (PROZAC ) 20 MG capsule Take 1 capsule by mouth once daily   ipratropium-albuterol  (DUONEB) 0.5-2.5 (3) MG/3ML SOLN Take 3 mLs by nebulization every 6 (six) hours as needed.   iron  polysaccharides (NIFEREX) 150 MG capsule Take 1 capsule (150 mg total) by mouth daily.   levETIRAcetam  (KEPPRA ) 250 MG tablet Take 250 mg by mouth 2 (two) times daily.    lidocaine  4 % Place 1 patch onto the skin daily.   midodrine  (PROAMATINE ) 10 MG tablet Take 1 tablet (10 mg total) by mouth 3 (three) times daily with meals.   Nebulizers (COMPRESSOR/NEBULIZER) MISC For use with medication for the lungs.  Use as directed.   ondansetron  (ZOFRAN ) 8 MG tablet Take 1 tablet (8 mg total) by mouth every 8 (eight) hours as needed for nausea or vomiting.   OXYGEN  Inhale 4 L into the lungs continuous.    pantoprazole  (PROTONIX ) 40 MG tablet Take 1 tablet (40 mg total) by mouth daily.   potassium chloride  (KLOR-CON ) 10 MEQ tablet Take 3 tablets (30 mEq total) by mouth daily.   pyridostigmine  (MESTINON ) 60 MG tablet Take 1 tablet (60 mg total) by mouth 3 (three) times daily.   sodium chloride  1 g tablet Take 1 tablet (1 g total) by mouth 2 (two) times daily with a meal.   zolpidem  (AMBIEN ) 10 MG tablet Take 0.5 tablets (5 mg total) by mouth at bedtime as needed. (Patient not taking: Reported on 12/24/2023)   [DISCONTINUED] fludrocortisone  (FLORINEF ) 0.1 MG tablet Take 1 tablet (0.1 mg total) by mouth 2 (two) times daily.   No facility-administered encounter medications on file as of 12/24/2023.    Review of Systems  Constitutional:  Negative for activity change, appetite change, fatigue and unexpected weight change.  HENT:  Negative for congestion and hearing loss.   Eyes: Negative.   Respiratory:  Negative for cough and shortness of breath.   Cardiovascular:  Negative for chest pain, palpitations and leg swelling.  Gastrointestinal:  Negative for abdominal pain, constipation and diarrhea.  Genitourinary:  Negative for difficulty urinating and dysuria.  Musculoskeletal:  Negative for arthralgias and myalgias.  Skin:  Negative for color change and wound.  Neurological:  Positive for dizziness and weakness.  Psychiatric/Behavioral:  Negative for agitation, behavioral problems and confusion.     Immunization History  Administered Date(s) Administered    sv, Bivalent, Protein Subunit Rsvpref,pf (Abrysvo) 12/31/2022   Fluad Quad(high Dose 65+) 10/24/2020   INFLUENZA, HIGH DOSE SEASONAL PF 11/04/2018, 11/22/2019, 12/31/2022   Influenza Whole 02/24/2005   Influenza,inj,Quad PF,6+ Mos 12/03/2012, 12/09/2013, 01/16/2015, 02/06/2016, 11/04/2016, 12/08/2017   Influenza-Unspecified 10/25/2021, 12/15/2023   PFIZER Comirnaty(Gray Top)Covid-19 Tri-Sucrose Vaccine 07/06/2020   PFIZER(Purple  Top)SARS-COV-2 Vaccination 02/28/2019, 03/21/2019, 11/14/2019   PNEUMOCOCCAL CONJUGATE-20 07/06/2020   Pneumococcal Conjugate-13 07/11/2014   Pneumococcal Polysaccharide-23 03/21/2005, 07/26/2015   RSV,unspecified 12/18/2023   Td 01/09/1999, 06/14/2007   Zoster, Live 09/07/2015   Pertinent  Health Maintenance Due  Topic Date Due   Mammogram  01/06/2024   Colonoscopy  03/24/2024 (Originally 08/18/2021)   Influenza Vaccine  Completed   DEXA SCAN  Completed      10/08/2022   11:38 AM 03/06/2023    1:12 PM 03/25/2023    2:04 PM 05/08/2023   11:10 AM 10/29/2023   12:09 PM  Fall Risk  Falls in the past  year? 1 0 0 0 1  Was there an injury with Fall? 1 0 0 0 1  Fall Risk Category Calculator 2 0 0 0 3  Patient at Risk for Falls Due to  No Fall Risks No Fall Risks No Fall Risks History of fall(s)  Fall risk Follow up Falls evaluation completed Education provided;Falls prevention discussed Falls evaluation completed Falls evaluation completed Falls evaluation completed   Functional Status Survey:    Vitals:   12/24/23 1502  BP: (!) 94/50  Pulse: 71  Resp: 14  Temp: (!) 97.2 F (36.2 C)  SpO2: 96%  Weight: 185 lb 3.2 oz (84 kg)  Height: 5' 9 (1.753 m)   Body mass index is 27.35 kg/m. Physical Exam Constitutional:      General: She is not in acute distress.    Appearance: She is well-developed. She is not diaphoretic.  HENT:     Head: Normocephalic and atraumatic.     Mouth/Throat:     Pharynx: No oropharyngeal exudate.  Eyes:     Conjunctiva/sclera: Conjunctivae normal.     Pupils: Pupils are equal, round, and reactive to light.  Cardiovascular:     Rate and Rhythm: Normal rate and regular rhythm.     Heart sounds: Normal heart sounds.  Pulmonary:     Effort: Pulmonary effort is normal.     Breath sounds: Normal breath sounds.  Abdominal:     General: Bowel sounds are normal.     Palpations: Abdomen is soft.  Musculoskeletal:     Cervical back: Normal range of motion  and neck supple.     Right lower leg: No edema.     Left lower leg: No edema.  Skin:    General: Skin is warm and dry.  Neurological:     Mental Status: She is alert.  Psychiatric:        Mood and Affect: Mood normal.     Labs reviewed: Recent Labs    09/02/23 0507 09/03/23 0458 09/04/23 0536 09/05/23 0625 09/26/23 0520 09/28/23 0553 12/04/23 1545 12/05/23 0552 12/06/23 1259 12/07/23 0326  NA 134* 134* 133*   < > 138   < > 133* 137  --  138  K 4.1 4.2 4.3   < > 2.9*   < > 3.3* 3.0*  --  3.8  CL 104 101 101   < > 102   < > 98 102  --  102  CO2 22 21* 22   < > 27   < > 25 27  --  27  GLUCOSE 105* 108* 110*   < > 119*   < > 105* 101*  --  100*  BUN 14 19 14    < > 13   < > 20 16  --  15  CREATININE 0.82 0.74 0.87   < > 0.64   < > 0.98 0.78  --  0.70  CALCIUM  9.0 9.0 8.9   < > 8.6*   < > 8.9 8.9  --  8.9  MG 2.0 2.0 2.0  --  1.7  --   --   --  2.0  --   PHOS 3.0 3.5 3.3  --   --   --   --   --   --   --    < > = values in this interval not displayed.   Recent Labs    09/25/23 2305 11/18/23 1407 11/23/23 0926  AST 17 20 26   ALT 12 13  17  ALKPHOS 83 92 96  BILITOT 0.9 0.9 2.0*  PROT 7.8 7.6 8.5*  ALBUMIN 3.9 4.0 4.5   Recent Labs    10/29/23 1246 11/06/23 1353 11/18/23 1407 11/23/23 0926 11/24/23 0555 12/03/23 1243 12/04/23 0455 12/07/23 0326  WBC 9.1 8.2   < > 14.7*   < > 10.7* 7.7 6.3  NEUTROABS 7.4 6.4  --  12.4*  --   --   --   --   HGB 10.7* 10.8*   < > 14.3   < > 12.9 11.6* 10.8*  HCT 32.8* 33.2*   < > 45.6   < > 40.2 36.9 34.1*  MCV 84.4 84.1   < > 83.8   < > 83.2 83.5 82.8  PLT 313.0 397.0   < > 421*   < > 289 291 330   < > = values in this interval not displayed.   Lab Results  Component Value Date   TSH 1.764 11/30/2023   Lab Results  Component Value Date   HGBA1C 6.2 03/18/2023   Lab Results  Component Value Date   CHOL 156 03/18/2023   HDL 64.10 03/18/2023   LDLCALC 72 03/18/2023   TRIG 98.0 03/18/2023   CHOLHDL 2 03/18/2023     Significant Diagnostic Results in last 30 days:  CUP PACEART REMOTE DEVICE CHECK Result Date: 12/16/2023 PPM Scheduled remote reviewed. Normal device function.  Presenting rhythm: AS/VP Next remote transmission per protocol. LA, CVRS  MR BRAIN W WO CONTRAST Result Date: 12/03/2023 CLINICAL DATA:  Headache, increasing frequency or severity Headache, new onset (Age >= 51y). Recurrent weakness, hypotension, dizziness, and weight loss. EXAM: MRI HEAD WITHOUT AND WITH CONTRAST TECHNIQUE: Multiplanar, multiecho pulse sequences of the brain and surrounding structures were obtained without and with intravenous contrast. CONTRAST:  8mL GADAVIST GADOBUTROL 1 MMOL/ML IV SOLN COMPARISON:  Head CT 09/14/2023 FINDINGS: Brain: There is no evidence of an acute infarct, midline shift, hydrocephalus, or extra-axial fluid collection. A few scattered chronic microhemorrhages are noted in the cerebral hemispheres and brainstem, nonspecific. Patchy T2 hyperintensities in the cerebral white matter bilaterally are nonspecific but compatible with moderate chronic small vessel ischemic disease. There is an approximately 2.5 cm region of mild hazy T2 FLAIR hyperintensity in the inferior left occipital lobe involving cortex and juxtacortical white matter with blurring of the gray-white junction, possible slight gyral expansion, and no associated enhancement or diffusion abnormality (series 9, image 19 and series 15, image 7). Mild cerebral atrophy is within normal limits for age. No abnormal intracranial enhancement is identified. Vascular: Major intracranial vascular flow voids are preserved. Skull and upper cervical spine: Unremarkable bone marrow signal. Sinuses/Orbits: Bilateral cataract extraction. Paranasal sinuses and mastoid air cells are clear. Other: None. IMPRESSION: 1. 2.5 cm region of nonenhancing T2 signal abnormality in the inferior left occipital lobe, indeterminate but could reflect focal cortical dysplasia,  low-grade glioma, or less likely encephalitis. Short-term MRI follow-up is recommended. 2. Moderate chronic small vessel ischemic disease. Electronically Signed   By: Dasie Hamburg M.D.   On: 12/03/2023 20:20    Assessment/Plan 1. Chronic orthostatic hypotension (Primary) Stable at this time fludrocortisone  was stopped and she was continued on midodrine  10 mg TID with meals and mestinon  60 mg TID.  She had an autoimmunue dysautonamia panel [facility] her PCP needs to follow up - droxidopa (NORTHERA) 100 MG CAPS; Take 1 capsule (100 mg total) by mouth 3 (three) times daily with meals.  Dispense: 90 capsule; Refill: 0 -  midodrine  (PROAMATINE ) 10 MG tablet; Take 1 tablet (10 mg total) by mouth 3 (three) times daily with meals.  Dispense: 90 tablet; Refill: 0 - pyridostigmine  (MESTINON ) 60 MG tablet; Take 1 tablet (60 mg total) by mouth 3 (three) times daily.  Dispense: 90 tablet; Refill: 0  2. Chronic right shoulder pain Pain improving at this time, continues on lidocaine  patch - lidocaine  4 %; Place 1 patch onto the skin daily.  Dispense: 30 patch; Refill: 0  3. Seizures (HCC) Followed by neuro, continues follow up outpatient.  No recent seizures - levETIRAcetam  (KEPPRA ) 250 MG tablet; Take 1 tablet (250 mg total) by mouth 2 (two) times daily.  Dispense: 60 tablet; Refill: 0  4. Chronic hypoxic respiratory failure (HCC) On chronic O2, O2 stable - albuterol  (VENTOLIN  HFA) 108 (90 Base) MCG/ACT inhaler; Inhale 2 puffs into the lungs every 4 (four) hours as needed for wheezing or shortness of breath.  Dispense: 18 g; Refill: 0  5. Chronic heart failure with preserved ejection fraction (HFpEF) (HCC) No worsening shortness of breath, edema, or weight gain - potassium chloride  (KLOR-CON ) 10 MEQ tablet; Take 3 tablets (30 mEq total) by mouth daily.  Dispense: 30 tablet; Refill: 0  6. Paroxysmal atrial fibrillation (HCC) Rate controlled.  Continues on eliquis  for anticoagulation  -  apixaban  (ELIQUIS ) 5 MG TABS tablet; Take 1 tablet (5 mg total) by mouth 2 (two) times daily.  Dispense: 60 tablet; Refill: 0  7. Centrilobular emphysema (HCC) Continues on chronic o2  8. Hyponatremia Stable on recent lab, continues on supplement - sodium chloride  1 g tablet; Take 1 tablet (1 g total) by mouth 2 (two) times daily with a meal.  Dispense: 60 tablet; Refill: 0  9. Iron  deficiency Ongoing anemia, hgb worse on last labs. No signs of blood loss but cbc needs to be followed up by PCP - iron  polysaccharides (NIFEREX) 150 MG capsule; Take 1 capsule (150 mg total) by mouth daily.  Dispense: 30 capsule; Refill: 0  10. Gastroesophageal reflux disease without esophagitis Stable on protonix  - pantoprazole  (PROTONIX ) 40 MG tablet; Take 1 tablet (40 mg total) by mouth daily.  Dispense: 30 tablet; Refill: 0  11. Pure hypercholesterolemia Continues statin - atorvastatin  (LIPITOR) 40 MG tablet; Take 1 tablet (40 mg total) by mouth daily.  Dispense: 30 tablet; Refill: 0  12. Depression, unspecified depression type Mood controlled, continue home regimen - FLUoxetine  (PROZAC ) 20 MG capsule; Take 1 capsule (20 mg total) by mouth daily.  Dispense: 30 capsule; Refill: 0  pt is stable for discharge-will need PT/OT/ per home health. No DME needed. Rx sent via epic for 1 month supply.  will need to follow up with PCP within 2 weeks.    Ellerie Arenz K. Caro BODILY [facility] [phone]

## 2023-12-24 NOTE — Progress Notes (Signed)
 Error   This encounter was created in error - please disregard.

## 2023-12-25 ENCOUNTER — Encounter: Payer: Self-pay | Admitting: Pharmacist

## 2023-12-25 NOTE — Progress Notes (Signed)
 Pharmacy Quality Measure Review  This patient is appearing on a report for being at risk of failing the adherence measure for cholesterol (statin) medications this calendar year.   Medication: atorvastatin  40 mg daily Last fill date: 09/13/23 for 90 day supply  Insurance report was not up to date. No action needed at this time.  Medication refilled 10/31 x30 day supply, 0 refills Reminder set ~11/30

## 2023-12-28 ENCOUNTER — Telehealth: Payer: Self-pay

## 2023-12-28 NOTE — Transitions of Care (Post Inpatient/ED Visit) (Signed)
 12/28/2023  Name: Diana Cook MRN: [id number] DOB: [birth year]  Today's TOC FU Call Status: Today's TOC FU Call Status:: Successful TOC FU Call Completed TOC FU Call Complete Date: 12/28/23 Patient's Name and Date of Birth confirmed.  Transition Care Management Follow-up Telephone Call Date of Discharge: 12/25/23 Discharge Facility: Other [facility]) Name of Other (Non[facility]) Discharge Facility: [facility] Type of Discharge: Inpatient Admission Primary Inpatient Discharge Diagnosis:: orthostatic hypertension How have you been since you were released from the hospital?: Better Any questions or concerns?: No  Items Reviewed: Did you receive and understand the discharge instructions provided?: Yes Medications obtained,verified, and reconciled?: Yes (Medications Reviewed) Any new allergies since your discharge?: No Dietary orders reviewed?: Yes Do you have support at home?: Yes Name of Support/Comfort Primary Source: caregiver  Medications Reviewed Today: Medications Reviewed Today     Reviewed by Emmitt Pan, LPN (Licensed Practical Nurse) on 12/28/23 at 1159  Med List Status: <None>   Medication Order Taking? Sig Documenting Provider Last Dose Status Informant  acetaminophen  (TYLENOL ) 500 MG tablet [id number] Yes Take 1,000 mg by mouth in the morning and at bedtime. [provider]  Active Self, Spouse/Significant Other           Med Note SIGRID, TIFFANY N   Fri Aug 28, 2023 11:36 PM) PRN  albuterol  (VENTOLIN  HFA) 108 (90 Base) MCG/ACT inhaler [id number] Yes Inhale 2 puffs into the lungs every 4 (four) hours as needed for wheezing or shortness of breath. Caro Harlene POUR, NP  Active   apixaban  (ELIQUIS ) 5 MG TABS tablet [id number] Yes Take 1 tablet (5 mg total) by mouth 2 (two) times daily. Caro Harlene POUR, NP  Active   aspirin  EC 81 MG tablet [id number] Yes Take 1 tablet (81 mg total) by mouth daily. Schnier, Cordella MATSU, MD  Active Self, Spouse/Significant  Other  atorvastatin  (LIPITOR) 40 MG tablet [id number] Yes Take 1 tablet (40 mg total) by mouth daily. Caro Harlene POUR, NP  Active   benzonatate  (TESSALON ) 200 MG capsule [id number] Yes Take 1 capsule (200 mg total) by mouth 2 (two) times daily as needed for cough. Avelina Greig BRAVO, MD  Active Self, Spouse/Significant Other           Med Note SIGRID, TIFFANY N   Fri Aug 28, 2023 11:41 PM) PRN  bisacodyl  (DULCOLAX) 10 MG suppository [id number] Yes Place 1 suppository (10 mg total) rectally daily as needed for mild constipation or moderate constipation. Cox, Amy N, DO  Active   Cholecalciferol  (VITAMIN D3) 50 MCG (2000 UT) capsule [id number] Yes Take 2,000 Units by mouth daily. [provider]  Active Self, Spouse/Significant Other  droxidopa (NORTHERA) 100 MG CAPS [id number] Yes Take 1 capsule (100 mg total) by mouth 3 (three) times daily with meals. Caro Harlene POUR, NP  Active   FLUoxetine  (PROZAC ) 20 MG capsule [id number] Yes Take 1 capsule (20 mg total) by mouth daily. Caro Harlene POUR, NP  Active   ipratropium-albuterol  (DUONEB) 0.5-2.5 (3) MG/3ML SOLN [id number] Yes Take 3 mLs by nebulization every 6 (six) hours as needed. Tamea Dedra CROME, MD  Active Self, Spouse/Significant Other  iron  polysaccharides (NIFEREX) 150 MG capsule [id number] Yes Take 1 capsule (150 mg total) by mouth daily. Caro Harlene POUR, NP  Active   levETIRAcetam  (KEPPRA ) 250 MG tablet [id number] Yes Take 1 tablet (250 mg total) by mouth 2 (two) times daily. Caro Harlene POUR, NP  Active   lidocaine  4 % [id number] Yes Place 1 patch onto the  skin daily. Caro Harlene POUR, NP  Active   midodrine  (PROAMATINE ) 10 MG tablet [id number] Yes Take 1 tablet (10 mg total) by mouth 3 (three) times daily with meals. Caro Harlene POUR, NP  Active   Nebulizers (COMPRESSOR/NEBULIZER) MISC [id number] Yes For use with medication for the lungs.  Use as directed. Tamea Dedra CROME, MD  Active Self, Spouse/Significant Other  ondansetron   (ZOFRAN ) 8 MG tablet [id number] Yes Take 1 tablet (8 mg total) by mouth every 8 (eight) hours as needed for nausea or vomiting. Tower, Laine LABOR, MD  Active Self, Spouse/Significant Other  OXYGEN  [id number] Yes Inhale 4 L into the lungs continuous. [provider]  Active Self, Spouse/Significant Other           Med Note CHRISTIE ALYSON Schaumann Jul 14, 2019 11:48 AM)    pantoprazole  (PROTONIX ) 40 MG tablet [id number] Yes Take 1 tablet (40 mg total) by mouth daily. Caro Harlene POUR, NP  Active   potassium chloride  (KLOR-CON ) 10 MEQ tablet [id number] Yes Take 3 tablets (30 mEq total) by mouth daily. Caro Harlene POUR, NP  Active   pyridostigmine  (MESTINON ) 60 MG tablet [id number] Yes Take 1 tablet (60 mg total) by mouth 3 (three) times daily. Caro Harlene POUR, NP  Active   sodium chloride  1 g tablet [id number] Yes Take 1 tablet (1 g total) by mouth 2 (two) times daily with a meal. Caro Harlene POUR, NP  Active   zolpidem  (AMBIEN ) 10 MG tablet [id number]  Take 0.5 tablets (5 mg total) by mouth at bedtime as needed.  Patient not taking: Reported on 12/28/2023   Caro Harlene POUR, NP  Active             Home Care and Equipment/Supplies: Were Home Health Services Ordered?: Yes Name of Home Health Agency:: [facility] Has Agency set up a time to come to your home?: No Any new equipment or medical supplies ordered?: NA  Functional Questionnaire: Do you need assistance with bathing/showering or dressing?: Yes Do you need assistance with meal preparation?: Yes Do you need assistance with eating?: No Do you have difficulty maintaining continence: No Do you need assistance with getting out of bed/getting out of a chair/moving?: No Do you have difficulty managing or taking your medications?: Yes  Follow up appointments reviewed: PCP Follow-up appointment confirmed?: Yes Date of PCP follow-up appointment?: 12/29/23 Follow-up Provider: [facility] Follow-up appointment  confirmed?: Yes Date of Specialist follow-up appointment?: 01/06/24 Follow-Up Specialty Provider:: kidney Do you need transportation to your follow-up appointment?: No Do you understand care options if your condition(s) worsen?: Yes-patient verbalized understanding    SIGNATURE Julian Lemmings, LPN [facility] Nurse Health Advisor Direct Dial [phone]

## 2023-12-29 ENCOUNTER — Ambulatory Visit (INDEPENDENT_AMBULATORY_CARE_PROVIDER_SITE_OTHER): Admitting: Family Medicine

## 2023-12-29 ENCOUNTER — Encounter: Payer: Self-pay | Admitting: Family Medicine

## 2023-12-29 VITALS — BP 112/62 | HR 65 | Temp 97.4°F

## 2023-12-29 DIAGNOSIS — I951 Orthostatic hypotension: Secondary | ICD-10-CM

## 2023-12-29 DIAGNOSIS — D509 Iron deficiency anemia, unspecified: Secondary | ICD-10-CM | POA: Diagnosis not present

## 2023-12-29 DIAGNOSIS — G901 Familial dysautonomia [Riley-Day]: Secondary | ICD-10-CM | POA: Insufficient documentation

## 2023-12-29 DIAGNOSIS — I480 Paroxysmal atrial fibrillation: Secondary | ICD-10-CM | POA: Diagnosis not present

## 2023-12-29 DIAGNOSIS — R944 Abnormal results of kidney function studies: Secondary | ICD-10-CM

## 2023-12-29 LAB — CBC WITH DIFFERENTIAL/PLATELET
Basophils Absolute: 0.1 K/uL (ref 0.0–0.1)
Basophils Relative: 0.8 % (ref 0.0–3.0)
Eosinophils Absolute: 0.2 K/uL (ref 0.0–0.7)
Eosinophils Relative: 2 % (ref 0.0–5.0)
HCT: 39.2 % (ref 36.0–46.0)
Hemoglobin: 12.6 g/dL (ref 12.0–15.0)
Lymphocytes Relative: 13.7 % (ref 12.0–46.0)
Lymphs Abs: 1 K/uL (ref 0.7–4.0)
MCHC: 32.3 g/dL (ref 30.0–36.0)
MCV: 81 fl (ref 78.0–100.0)
Monocytes Absolute: 0.6 K/uL (ref 0.1–1.0)
Monocytes Relative: 7.9 % (ref 3.0–12.0)
Neutro Abs: 5.8 K/uL (ref 1.4–7.7)
Neutrophils Relative %: 75.6 % (ref 43.0–77.0)
Platelets: 379 K/uL (ref 150.0–400.0)
RBC: 4.83 Mil/uL (ref 3.87–5.11)
RDW: 17.9 % — ABNORMAL HIGH (ref 11.5–15.5)
WBC: 7.7 K/uL (ref 4.0–10.5)

## 2023-12-29 LAB — BASIC METABOLIC PANEL WITH GFR
BUN: 18 mg/dL (ref 6–23)
CO2: 25 meq/L (ref 19–32)
Calcium: 10.1 mg/dL (ref 8.4–10.5)
Chloride: 101 meq/L (ref 96–112)
Creatinine, Ser: 1.13 mg/dL (ref 0.40–1.20)
GFR: 47.62 mL/min — ABNORMAL LOW (ref >60.00)
Glucose, Bld: 98 mg/dL (ref 70–99)
Potassium: 5.8 meq/L — ABNORMAL HIGH (ref 3.5–5.1)
Sodium: 133 meq/L — ABNORMAL LOW (ref 135–145)

## 2023-12-29 LAB — IRON: Iron: 82 ug/dL (ref 42–145)

## 2023-12-29 MED ORDER — DROXIDOPA 100 MG PO CAPS: 100.0000 mg | ORAL_CAPSULE | Freq: Three times a day (TID) | ORAL | 5 refills | Status: DC

## 2023-12-29 MED ORDER — MIDODRINE HCL 10 MG PO TABS: 10.0000 mg | ORAL_TABLET | Freq: Three times a day (TID) | ORAL | 5 refills | Status: AC

## 2023-12-29 MED ORDER — PYRIDOSTIGMINE BROMIDE 60 MG PO TABS: 60.0000 mg | ORAL_TABLET | Freq: Three times a day (TID) | ORAL | 5 refills | Status: AC

## 2023-12-29 NOTE — Assessment & Plan Note (Signed)
 Lab today  Better fluid intake   For nephrology visit soon

## 2023-12-29 NOTE — Progress Notes (Signed)
 Subjective:    Patient ID: Diana Cook, female    DOB: [birth year], 73 y.o.   MRN: [id number]  HPI  Wt Readings from Last 3 Encounters:  12/24/23 185 lb 3.2 oz (84 kg)  12/16/23 185 lb 3.2 oz (84 kg)  12/15/23 186 lb (84.4 kg)      Vitals:   12/29/23 1134  BP: 112/62  Pulse: 65  Temp: (!) 97.4 F (36.3 C)  SpO2: 92%     Pt presents for follow up after stay in rehab facility after hospitalization   Has been hospitalized for recurrent syncope /orthostatic hypotension    Per nursing home note  Elevated blood pressure last week and fludrocortisone  was stopped  Continued on midodrine  10 mg TID with meals (hold for systolic over 120) Mestinon  60 mg tid  Droxidopa 100 mg tid (hold for systolic over 120)  Auto immune dysautonamia panel sent to [facility] clinic   Per last note from rehab stay    CUP PACEART REMOTE DEVICE CHECK Result Date: 12/16/2023 PPM Scheduled remote reviewed. Normal device function.  Presenting rhythm: AS/VP Next remote transmission per protocol. LA, CVRS   MR BRAIN W WO CONTRAST Result Date: 12/03/2023 CLINICAL DATA:  Headache, increasing frequency or severity Headache, new onset (Age >= 51y). Recurrent weakness, hypotension, dizziness, and weight loss. EXAM: MRI HEAD WITHOUT AND WITH CONTRAST TECHNIQUE: Multiplanar, multiecho pulse sequences of the brain and surrounding structures were obtained without and with intravenous contrast. CONTRAST:  8mL GADAVIST GADOBUTROL 1 MMOL/ML IV SOLN COMPARISON:  Head CT 09/14/2023 FINDINGS: Brain: There is no evidence of an acute infarct, midline shift, hydrocephalus, or extra-axial fluid collection. A few scattered chronic microhemorrhages are noted in the cerebral hemispheres and brainstem, nonspecific. Patchy T2 hyperintensities in the cerebral white matter bilaterally are nonspecific but compatible with moderate chronic small vessel ischemic disease. There is an approximately 2.5 cm region of mild hazy T2 FLAIR  hyperintensity in the inferior left occipital lobe involving cortex and juxtacortical white matter with blurring of the gray-white junction, possible slight gyral expansion, and no associated enhancement or diffusion abnormality (series 9, image 19 and series 15, image 7). Mild cerebral atrophy is within normal limits for age. No abnormal intracranial enhancement is identified. Vascular: Major intracranial vascular flow voids are preserved. Skull and upper cervical spine: Unremarkable bone marrow signal. Sinuses/Orbits: Bilateral cataract extraction. Paranasal sinuses and mastoid air cells are clear. Other: None. IMPRESSION: 1. 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 Short-term MRI follow-up is recommended. 2. Moderate chronic small vessel ischemic disease. Electronically Signed   By: Dasie Hamburg M.D.   On: 12/03/2023 20:20       Assessment/Plan 1. Chronic orthostatic hypotension (Primary) Stable at this time fludrocortisone  was stopped and she was continued on midodrine  10 mg TID with meals and mestinon  60 mg TID.  She had an autoimmunue [facility]amia panel [facility]CP needs to follow up   - droxidopa (NORTHERA) 100 MG CAPS; Take 1 capsule (100 mg total) by mouth 3 (three) times daily with meals.  Dispense: 90 capsule; Refill: 0 - midodrine  (PROAMATINE ) 10 MG tablet; Take 1 tablet (10 mg total) by mouth 3 (three) times daily with meals.  Dispense: 90 tablet; Refill: 0 - pyridostigmine  (MESTINON ) 60 MG tablet; Take 1 tablet (60 mg total) by mouth 3 (three) times daily.  Dispense: 90 tablet; Refill: 0  2. Chronic right shoulder pain Pain improving at this time, continues on lidocaine  patch - lidocaine  4 %; Place 1 patch onto the skin daily.  Dispense: 30 patch; Refill: 0   3. Seizures (HCC) Followed by neuro, continues follow up  outpatient.  No recent seizures - levETIRAcetam  (KEPPRA ) 250 MG tablet; Take 1 tablet (250 mg total) by mouth 2 (two) times daily.  Dispense: 60 tablet; Refill: 0   4. Chronic hypoxic respiratory failure (HCC) On chronic O2, O2 stable - albuterol  (VENTOLIN  HFA) 108 (90 Base) MCG/ACT inhaler; Inhale 2 puffs into the lungs every 4 (four) hours as needed for wheezing or shortness of breath.  Dispense: 18 g; Refill: 0   5. Chronic heart failure with preserved ejection fraction (HFpEF) (HCC) No worsening shortness of breath, edema, or weight gain - potassium chloride  (KLOR-CON ) 10 MEQ tablet; Take 3 tablets (30 mEq total) by mouth daily.  Dispense: 30 tablet; Refill: 0   6. Paroxysmal atrial fibrillation (HCC) Rate controlled.  Continues on eliquis  for anticoagulation  - apixaban  (ELIQUIS ) 5 MG TABS tablet; Take 1 tablet (5 mg total) by mouth 2 (two) times daily.  Dispense: 60 tablet; Refill: 0   7. Centrilobular emphysema (HCC) Continues on chronic o2   8. Hyponatremia Stable on recent lab, continues on supplement - sodium chloride  1 g tablet; Take 1 tablet (1 g total) by mouth 2 (two) times daily with a meal.  Dispense: 60 tablet; Refill: 0   9. Iron  deficiency Ongoing anemia, hgb worse on last labs. No signs of blood loss but cbc needs to be followed up by PCP - iron  polysaccharides (NIFEREX) 150 MG capsule; Take 1 capsule (150 mg total) by mouth daily.  Dispense: 30 capsule; Refill: 0   10. Gastroesophageal reflux disease without esophagitis Stable on protonix  - pantoprazole  (PROTONIX ) 40 MG tablet; Take 1 tablet (40 mg total) by mouth daily.  Dispense: 30 tablet; Refill: 0   11. Pure hypercholesterolemia Continues statin - atorvastatin  (LIPITOR) 40 MG tablet; Take 1 tablet (40 mg total) by mouth daily.  Dispense: 30 tablet; Refill: 0   12. Depression, unspecified depression type Mood controlled, continue home regimen - FLUoxetine  (PROZAC ) 20 MG capsule; Take 1 capsule (20 mg  total) by mouth daily.  Dispense: 30 capsule; Refill: 0   pt is stable for discharge-will need PT/OT/ per home health. No DME needed. Rx sent via epic for 1 month supply.  will need to follow up with PCP within 2 weeks.    Has nephrology appointment 01/06/24  Looking forward to that      Only had one syncopal episode the whole time she was at the rehab    Came home on Friday  Occational slight nausea  Occational slight light headed  Wearing support garment   No trouble eating / eating less but happy with that   In wheelchair today   Uses walker (from den to bedroom- got dizzy and sweaty but did not pass out)   Thinks she can walk about 3 minutes with walker without feeling that bad   Therapy is going out - starting soon   Not dizzy sitting in the chair  Still not steady    Lab Results  Component Value Date   IRON  19 (L) 11/06/2023   FERRITIN 90.4 11/06/2023      Lab Results  Component Value Date   NA 138 12/07/2023   K 3.8 12/07/2023   CO2 27 12/07/2023   GLUCOSE 100 (H) 12/07/2023  BUN 15 12/07/2023   CREATININE 0.70 12/07/2023   CALCIUM  8.9 12/07/2023   GFR 59.45 (L) 11/06/2023   EGFR 79 10/12/2023   GFRNONAA >60 12/07/2023   Lab Results  Component Value Date   ALT 17 11/23/2023   AST 26 11/23/2023   ALKPHOS 96 11/23/2023   BILITOT 2.0 (H) 11/23/2023   Lab Results  Component Value Date   WBC 6.3 12/07/2023   HGB 10.8 (L) 12/07/2023   HCT 34.1 (L) 12/07/2023   MCV 82.8 12/07/2023   PLT 330 12/07/2023   Lab Results  Component Value Date   IRON  19 (L) 11/06/2023   FERRITIN 90.4 11/06/2023      Patient Active Problem List   Diagnosis Date Noted   Dysautonomia (HCC) 12/29/2023   DNR (do not resuscitate) 12/15/2023   Weight loss 12/03/2023   Hypotension 12/03/2023   Recurrent syncope 11/30/2023   Personal history of fall 11/27/2023   Iron  deficiency 11/02/2023   Syncope and collapse 09/26/2023   Hypokalemia 09/26/2023   Carotid  stenosis s/p stents 09/26/2023   Chronic anticoagulation 09/26/2023   Physical deconditioning 09/18/2023   Syncope 08/29/2023   Chronic orthostatic hypotension 08/28/2023   Decreased GFR 04/29/2023   Presence of heart assist device (HCC) 03/25/2023   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/25/2023   Persistent cough 03/03/2023   Urinary urgency 02/09/2023   Carotid stenosis, symptomatic w/o infarct 03/23/2022   Right shoulder pain 11/25/2021   Anemia 11/03/2021   Thyroid  nodule 09/17/2021   Dyslipidemia 09/05/2021   Depression 09/05/2021   GERD without esophagitis 09/05/2021   Vitamin D  deficiency 08/20/2021   Colon cancer screening 07/30/2021   Hypercalcemia 07/29/2021   Weakness 07/08/2021   Dysuria 07/08/2021   Arterial hypotension 07/08/2021   Paroxysmal atrial fibrillation (HCC) 04/11/2021   Current use of proton pump inhibitor 10/24/2020   Sleep apnea 10/24/2020   Fatigue 10/24/2020   Lightheadedness 05/16/2020   Radicular pain in left arm 03/02/2020   Neck pain on left side 02/15/2020   Paresthesia 02/15/2020   Chronic hypoxic respiratory failure (HCC) 11/17/2019   Chronic heart failure with preserved ejection fraction (HFpEF) (HCC) 08/11/2019   Coronary artery disease of native artery of native heart with stable angina pectoris 06/20/2019   Gastroesophageal reflux disease    Stomach irritation    Abnormal CT scan, esophagus    Polyp of ascending colon    Personal history of tobacco use, presenting hazards to health 01/18/2018   Grade I diastolic dysfunction 12/21/2017   Globus pharyngeus 12/21/2017   Chronic obstructive pulmonary disease (HCC) 12/15/2017   Hyperlipidemia 12/07/2017   Seizures (HCC) 10/01/2016   Grief reaction 04/11/2016   History of breast cancer 12/11/2015   Osteoporosis 11/22/2015   Estrogen deficiency 09/07/2015   Wrinkles 09/07/2015   Post herpetic neuralgia 09/01/2014   Encounter for Medicare annual wellness exam 07/11/2014    Routine general medical examination at a health care facility 06/26/2013   H/O small bowel obstruction 04/15/2013   SOB (shortness of breath) 02/10/2013   Complete heart block s/p pacemaker (HCC) 11/03/2011   Pacemaker-[employer] 11/03/2011   Lumbar spinal stenosis 09/02/2011   POSTMENOPAUSAL STATUS 08/06/2009   HIDRADENITIS SUPPURATIVA 06/26/2008   HERNIATED DISC 12/14/2007   Hyperkalemia 08/06/2007   History of colonic polyps 09/14/2006   Former smoker 06/10/2006   Asthma 06/10/2006   H/O idiopathic seizure 06/10/2006   Insomnia 06/10/2006   Past Medical History:  Diagnosis Date   Allergic rhinitis  Arthritis    Asthma    as a child, mild now   Breast cancer (HCC) 12/2015   right breast cancer, lumpectomy and mammosite    Cardiac pacemaker in situ    Colon polyps    colonoscopy 7/08, tubular adenoma   Complete heart block (HCC) 09/2011   s/p PPM implanted in [place] [facility]   COPD (chronic obstructive pulmonary disease) (HCC)    Myocardial infarction (HCC) 2011   Orthostatic hypotension    Osteopenia 10/2015   Pacemaker    2011   Paroxysmal atrial fibrillation (HCC) 03/2021   Incidentally detected on pacemaker interrogation   Personal history of radiation therapy 2017   right breast ca, mammosite placed   Rotator cuff tear, right 02/2022   Had an MRI in [place] I have 2 tears.   Seizure disorder (HCC)    Seizures (HCC)    first one was when she was 73 years old    Small bowel obstruction (HCC)    1988 and 2002   Tobacco abuse    Past Surgical History:  Procedure Laterality Date   ABDOMINAL HYSTERECTOMY     BREAST BIOPSY Right 2007   benign inflammatory changes, mass due to underwire bra   BREAST BIOPSY Left 01/02/2016   columnar cell changes without atypical hyperplasia.   BREAST BIOPSY Right 12/06/2015   rt breast mass 10:00, bx done at Dr. Gilberto office, invasive ductal carcinoma   BREAST EXCISIONAL BIOPSY Left 01/02/2016   COLUMNAR CELL CHANGE  AND HYPERPLASIA ASSOCIATED WITH LUMINAL AND STROMAL CALCIFICATIONS   BREAST LUMPECTOMY Right 01/02/2016   invasive mammary carcinoma, clear margins, negative LN   BREAST LUMPECTOMY WITH SENTINEL LYMPH NODE BIOPSY Right 01/02/2016   pT1c, N0; ER/ PR 100%; Her 2 neu not over expressed: BREAST LUMPECTOMY WITH SENTINEL LYMPH NODE BX;  Surgeon: Reyes LELON Cota, MD;  Location: [facility] ORS;  Service: General;  Laterality: Right;   CARDIAC CATHETERIZATION     CAROTID PTA/STENT INTERVENTION Right 04/29/2022   Procedure: CAROTID PTA/STENT INTERVENTION;  Surgeon: Jama Cordella MATSU, MD;  Location: [facility] INVASIVE CV LAB;  Service: Cardiovascular;  Laterality: Right;   CATARACT EXTRACTION Bilateral    COLONOSCOPY  10/2015   Dr Teressa   COLONOSCOPY WITH PROPOFOL  N/A 08/19/2018   Procedure: COLONOSCOPY WITH PROPOFOL ;  Surgeon: Janalyn Keene NOVAK, MD;  Location: [facility] ENDOSCOPY;  Service: Endoscopy;  Laterality: N/A;   ESOPHAGOGASTRODUODENOSCOPY (EGD) WITH PROPOFOL  N/A 08/19/2018   Procedure: ESOPHAGOGASTRODUODENOSCOPY (EGD) WITH PROPOFOL ;  Surgeon: Janalyn Keene NOVAK, MD;  Location: [facility] ENDOSCOPY;  Service: Endoscopy;  Laterality: N/A;   EXPLORATORY LAPAROTOMY  01/25/2001   Exploratory laparotomy, lysis of adhesions, identification of internal hernia secondary to omental adhesion. Prolonged postoperative ileus.   gyn surgery  1993   hysterectomy- form endometriosis   LAPAROSCOPY     PACEMAKER INSERTION  10/24/2011   [employer] Advantio dual chamber PPM implanted by Dr Ronny at [facility] in [place]   PPM GENERATOR CHANGEOUT N/A 12/05/2019   Procedure: PPM GENERATOR HERMA;  Surgeon: Fernande Elspeth BROCKS, MD;  Location: [facility] INVASIVE CV LAB;  Service: Cardiovascular;  Laterality: N/A;   RIGHT/LEFT HEART CATH AND CORONARY ANGIOGRAPHY Bilateral 07/19/2019   Procedure: RIGHT/LEFT HEART CATH AND CORONARY ANGIOGRAPHY;  Surgeon: Mady Bruckner, MD;  Location: [facility] INVASIVE CV LAB;   Service: Cardiovascular;  Laterality: Bilateral;   TEMPORARY PACEMAKER N/A 12/05/2019   Procedure: TEMPORARY PACEMAKER;  Surgeon: Fernande Elspeth BROCKS, MD;  Location: [facility] INVASIVE CV LAB;  Service:  Cardiovascular;  Laterality: N/A;   Social History   Tobacco Use   Smoking status: Former    Current packs/day: 0.00    Average packs/day: 1.5 packs/day for 42.0 years (63.0 ttl pk-yrs)    Types: Cigarettes    Start date: 11/10/1968    Quit date: 11/11/2010    Years since quitting: 13.1   Smokeless tobacco: Never  Vaping Use   Vaping status: Never Used  Substance Use Topics   Alcohol use: Yes    Alcohol/week: 1.0 - 2.0 standard drink of alcohol    Types: 1 - 2 Glasses of wine per week    Comment: weekly   Drug use: No   Family History  Problem Relation Age of Onset   Stroke Mother    Heart disease Mother 6       MI and CABG   Dementia Mother    Coronary artery disease Father    Parkinsonism Father    Breast cancer Sister    Cancer - Cervical Daughter 41       died 03-17-2023   Heart attack Brother 49   Colon cancer Neg Hx    Allergies  Allergen Reactions   Codeine Nausea And Vomiting   Morphine  And Codeine Nausea Only   Current Outpatient Medications on File Prior to Visit  Medication Sig Dispense Refill   acetaminophen  (TYLENOL ) 500 MG tablet Take 1,000 mg by mouth in the morning and at bedtime.     albuterol  (VENTOLIN  HFA) 108 (90 Base) MCG/ACT inhaler Inhale 2 puffs into the lungs every 4 (four) hours as needed for wheezing or shortness of breath. 18 g 0   apixaban  (ELIQUIS ) 5 MG TABS tablet Take 1 tablet (5 mg total) by mouth 2 (two) times daily. 60 tablet 0   aspirin  EC 81 MG tablet Take 1 tablet (81 mg total) by mouth daily. 150 tablet 2   atorvastatin  (LIPITOR) 40 MG tablet Take 1 tablet (40 mg total) by mouth daily. 30 tablet 0   benzonatate  (TESSALON ) 200 MG capsule Take 1 capsule (200 mg total) by mouth 2 (two) times daily as needed for cough. 20 capsule 0   Cholecalciferol   (VITAMIN D3) 50 MCG (2000 UT) capsule Take 2,000 Units by mouth daily.     FLUoxetine  (PROZAC ) 20 MG capsule Take 1 capsule (20 mg total) by mouth daily. 30 capsule 0   ipratropium-albuterol  (DUONEB) 0.5-2.5 (3) MG/3ML SOLN Take 3 mLs by nebulization every 6 (six) hours as needed. 360 mL 3   iron  polysaccharides (NIFEREX) 150 MG capsule Take 1 capsule (150 mg total) by mouth daily. 30 capsule 0   levETIRAcetam  (KEPPRA ) 250 MG tablet Take 1 tablet (250 mg total) by mouth 2 (two) times daily. 60 tablet 0   lidocaine  4 % Place 1 patch onto the skin daily. 30 patch 0   Nebulizers (COMPRESSOR/NEBULIZER) MISC For use with medication for the lungs.  Use as directed. 1 each 0   ondansetron  (ZOFRAN ) 8 MG tablet Take 1 tablet (8 mg total) by mouth every 8 (eight) hours as needed for nausea or vomiting. 60 tablet 0   OXYGEN  Inhale 4 L into the lungs continuous.     pantoprazole  (PROTONIX ) 40 MG tablet Take 1 tablet (40 mg total) by mouth daily. 30 tablet 0   potassium chloride  (KLOR-CON ) 10 MEQ tablet Take 3 tablets (30 mEq total) by mouth daily. 30 tablet 0   sodium chloride  1 g tablet Take 1 tablet (1 g total) by mouth 2 (  two) times daily with a meal. 60 tablet 0   zolpidem  (AMBIEN ) 10 MG tablet Take 0.5 tablets (5 mg total) by mouth at bedtime as needed. 30 tablet 0   No current facility-administered medications on file prior to visit.    Review of Systems  Constitutional:  Positive for activity change and appetite change. Negative for chills and fever.  Respiratory:  Negative for cough.        Breathing is baseline   Cardiovascular:  Negative for chest pain and leg swelling.  Gastrointestinal:  Positive for nausea.       Occational nausea No vomiting Better fluid intake    Endocrine: Negative for polydipsia, polyphagia and polyuria.  Neurological:  Positive for light-headedness.       Less light headed        Objective:   Physical Exam Constitutional:      General: She is not in acute  distress.    Appearance: Normal appearance. She is well-developed and normal weight. She is not ill-appearing or diaphoretic.     Comments: In wheelchair  Feeling better   HENT:     Head: Normocephalic and atraumatic.  Eyes:     Conjunctiva/sclera: Conjunctivae normal.     Pupils: Pupils are equal, round, and reactive to light.  Neck:     Thyroid : No thyromegaly.     Vascular: No carotid bruit or JVD.  Cardiovascular:     Rate and Rhythm: Normal rate and regular rhythm.     Heart sounds: Normal heart sounds. No murmur heard.    No gallop.  Pulmonary:     Effort: Pulmonary effort is normal. No respiratory distress.     Breath sounds: Normal breath sounds. No stridor. No wheezing, rhonchi or rales.  Abdominal:     General: There is no distension or abdominal bruit.     Palpations: Abdomen is soft.     Tenderness: There is no abdominal tenderness.  Musculoskeletal:     Cervical back: Normal range of motion and neck supple.     Right lower leg: No edema.     Left lower leg: No edema.  Lymphadenopathy:     Cervical: No cervical adenopathy.  Skin:    General: Skin is warm and dry.     Coloration: Skin is not pale.     Findings: No rash.  Neurological:     Mental Status: She is alert.     Cranial Nerves: No cranial nerve deficit.     Coordination: Coordination normal.     Deep Tendon Reflexes: Reflexes are normal and symmetric. Reflexes normal.     Comments: Did not attempt to stand today  Psychiatric:        Mood and Affect: Mood normal.     Comments: In good spirits today           Assessment & Plan:   Problem List Items Addressed This Visit       Cardiovascular and Mediastinum   Paroxysmal atrial fibrillation (HCC)   No changes clinically Under cardiology care Normal rate On eliquis         Relevant Medications   droxidopa (NORTHERA) 100 MG CAPS   midodrine  (PROAMATINE ) 10 MG tablet   Chronic orthostatic hypotension - Primary   Neurogenic from  dysautonomia  ? If auto immune (? Some lab pending from [facility] clinic currently)  S/p rehabilitation stay after most recent hospitalization  Finally better controlled with  Droxidopa 100 mg tid (hold for systolic over 120) Minodrine  10 mg tid (hold for systolic over 120) Pyridostigmine  60 mg tid  Doses medications for systolic blood pressure under 879   Monitoring closely  Keeping up fluids Wearing compression garments  Has upcoming appointment with nephrology  Also under cardiology care   Can walk less than 3 minutes with her walker  PT due to come out soon and start therapy  Fall precautions noted   Lab today       Relevant Medications   droxidopa (NORTHERA) 100 MG CAPS   midodrine  (PROAMATINE ) 10 MG tablet   pyridostigmine  (MESTINON ) 60 MG tablet     Nervous and Auditory   Dysautonomia (HCC)     Other   Decreased GFR   Lab today  Better fluid intake   For nephrology visit soon       Relevant Orders   Basic metabolic panel with GFR   CBC with Differential/Platelet   Anemia   Noted in hospital Taking niferex May be anemia of chronic dz Lab today      Relevant Orders   Basic metabolic panel with GFR   CBC with Differential/Platelet   Ferritin   Iron 

## 2023-12-29 NOTE — Assessment & Plan Note (Signed)
 No changes clinically Under cardiology care Normal rate On eliquis 

## 2023-12-29 NOTE — Patient Instructions (Signed)
 Labs today   Let us  know if PT does not come out soon   Take short walks with walker (supervised)    Keep up fluids Keep eating regularly  Continue support garments    I will send in the blood pressure support medicines   See the kidney doctor as planned

## 2023-12-29 NOTE — Assessment & Plan Note (Signed)
 Noted in hospital Taking niferex May be anemia of chronic dz Lab today

## 2023-12-29 NOTE — Assessment & Plan Note (Addendum)
 Neurogenic from dysautonomia  ? If auto immune (? Some lab pending from [facility] clinic currently)  S/p rehabilitation stay after most recent hospitalization  Finally better controlled with  Droxidopa 100 mg tid (hold for systolic over 120) Minodrine 10 mg tid (hold for systolic over 120) Pyridostigmine  60 mg tid  Doses medications for systolic blood pressure under 879   Monitoring closely  Keeping up fluids Wearing compression garments  Has upcoming appointment with nephrology  Also under cardiology care   Can walk less than 3 minutes with her walker  PT due to come out soon and start therapy  Fall precautions noted   Lab today

## 2023-12-30 ENCOUNTER — Ambulatory Visit: Payer: Self-pay | Admitting: Family Medicine

## 2023-12-30 LAB — FERRITIN: Ferritin: 96.3 ng/mL (ref 10.0–291.0)

## 2024-01-04 DIAGNOSIS — I2510 Atherosclerotic heart disease of native coronary artery without angina pectoris: Secondary | ICD-10-CM

## 2024-01-04 DIAGNOSIS — K219 Gastro-esophageal reflux disease without esophagitis: Secondary | ICD-10-CM

## 2024-01-04 DIAGNOSIS — J4489 Other specified chronic obstructive pulmonary disease: Secondary | ICD-10-CM

## 2024-01-04 DIAGNOSIS — G40909 Epilepsy, unspecified, not intractable, without status epilepticus: Secondary | ICD-10-CM | POA: Diagnosis not present

## 2024-01-04 DIAGNOSIS — J9611 Chronic respiratory failure with hypoxia: Secondary | ICD-10-CM | POA: Diagnosis not present

## 2024-01-04 DIAGNOSIS — I5032 Chronic diastolic (congestive) heart failure: Secondary | ICD-10-CM | POA: Diagnosis not present

## 2024-01-04 DIAGNOSIS — I252 Old myocardial infarction: Secondary | ICD-10-CM

## 2024-01-04 DIAGNOSIS — S93401D Sprain of unspecified ligament of right ankle, subsequent encounter: Secondary | ICD-10-CM | POA: Diagnosis not present

## 2024-01-04 DIAGNOSIS — E785 Hyperlipidemia, unspecified: Secondary | ICD-10-CM

## 2024-01-04 DIAGNOSIS — I442 Atrioventricular block, complete: Secondary | ICD-10-CM

## 2024-01-04 DIAGNOSIS — H9193 Unspecified hearing loss, bilateral: Secondary | ICD-10-CM

## 2024-01-04 DIAGNOSIS — I480 Paroxysmal atrial fibrillation: Secondary | ICD-10-CM

## 2024-01-06 ENCOUNTER — Other Ambulatory Visit: Payer: Self-pay | Admitting: Family Medicine

## 2024-01-06 NOTE — Progress Notes (Signed)
 D/c K due to high K level

## 2024-01-07 ENCOUNTER — Ambulatory Visit
Admission: RE | Admit: 2024-01-07 | Discharge: 2024-01-07 | Disposition: A | Discharge status: Home / Self Care | Source: Ambulatory Visit | Attending: General Surgery | Admitting: General Surgery

## 2024-01-07 DIAGNOSIS — Z1231 Encounter for screening mammogram for malignant neoplasm of breast: Secondary | ICD-10-CM | POA: Diagnosis present

## 2024-01-19 ENCOUNTER — Ambulatory Visit: Payer: Self-pay

## 2024-01-19 NOTE — Telephone Encounter (Signed)
 Will document on previous triage call.

## 2024-01-19 NOTE — Telephone Encounter (Signed)
 Reason for Disposition . Systolic BP >= 180 OR Diastolic >= 110  Answer Assessment - Initial Assessment Questions No available appts with pcp, Pt's husband declines appt and request to speak with Dr. Randeen.  Advised ED/ 911 if symptoms worsen.  Nurse instructed to recheck BP, Current BP 198/102 hr 62  1. BLOOD PRESSURE: What is your blood pressure? Did you take at least two measurements 5 minutes apart?     Home Health nurse called initially to report elevated BP 2. ONSET: When did you take your blood pressure?     today 3. HOW: How did you take your blood pressure? (e.g., automatic home BP monitor, visiting nurse)     Auto bp on wrist 4. HISTORY: Do you have a history of high blood pressure?     HTN 5. MEDICINES: Are you taking any medicines for blood pressure? Have you missed any doses recently?     BP; last took Midodrine ; last taken this morning; this morning systolic was under 120 6. OTHER SYMPTOMS: Do you have any symptoms? (e.g., blurred vision, chest pain, difficulty breathing, headache, weakness)     Denies HA, blurred vision, chest pain, diff breathing, weakness/ numbness  Protocols used: Blood Pressure - High-A-AH

## 2024-01-19 NOTE — Addendum Note (Signed)
 Addended by: LONZO GARRE A on: 01/19/2024 04:35 PM   Modules accepted: Orders, Level of Service

## 2024-01-19 NOTE — Telephone Encounter (Signed)
 Please check in with her today or tomorrow if reach able  Thanks

## 2024-01-19 NOTE — Telephone Encounter (Signed)
 Copied from CRM #[id number]. Topic: Clinical - Red Word Triage >> Jan 19, 2024  1:00 PM Dedra B wrote: [facility] that prompted transfer to Nurse Triage: Marciano Novak, PT with [facility], said pt's BP was 190/88 sitting and 142/80 standing. She took it multiple times sitting and standing with 10 minutes in between readings with one reading being 165/100. Pls call pt. >> Jan 19, 2024  4:23 PM Aisha D wrote: UPDATE: Pt is returning a missed call from a nurse and calling back to speak with a nurse.

## 2024-01-19 NOTE — Telephone Encounter (Signed)
 Nurse attempted to resume call after speaking with CAL, pt's spouse disconnected call.  Nurse called again, informed pt's spouse that message was sent to MD and does not retrieve any further recommendations today, then advised ED.  Pt's spouse declined advise and will wait for call back.

## 2024-01-19 NOTE — Telephone Encounter (Signed)
 Called CAL, spoke Emmie, reports Dr. Randeen is currently with patient and unable to get her at this time.  Nurse informed elevated BP and patient request to speak with MD and declines appt.  Will send HP

## 2024-01-19 NOTE — Telephone Encounter (Signed)
 2 of her 3 low blood pressure  medicines are labeled to hold for systolic over 120  Per last note Droxidopa  100 mg tid (hold for systolic over 120) Minodrine 10 mg tid (hold for systolic over 120)   When blood pressure was elevated- had she taken these or not ?  How is blood pressure today (sitting/standing)  How is she feeling ?  Any other new findings or symptoms ?

## 2024-01-19 NOTE — Telephone Encounter (Signed)
 Copied from CRM #[id number]. Topic: Clinical - Red Word Triage >> Jan 19, 2024  1:00 PM Dedra B wrote: [facility] that prompted transfer to Nurse Triage: Marciano Novak, PT with [facility], said pt's BP was 190/88 sitting and 142/80 standing. She took it multiple times sitting and standing with 10 minutes in between readings with one reading being 165/100. Pls call pt.  Phone call placed to patient-no answer. Voicemail left to call back to Nurse triage. Call placed in call back folder.

## 2024-01-19 NOTE — Telephone Encounter (Signed)
 Received call that patient needs triage, patient was not transferred to this writer, line disconnected. 1st attempt to reach patient back. Chart reviewed-triage attempts earlier went unanswered, this writer attempted to call patient but did not answer, noted patient has called back during this writers attempt and has reached another triage nurse.   Marking this encounter erroneous.

## 2024-01-19 NOTE — Telephone Encounter (Signed)
 This encounter was created in error - please disregard.

## 2024-01-20 ENCOUNTER — Other Ambulatory Visit: Payer: Self-pay | Admitting: Family Medicine

## 2024-01-20 NOTE — Telephone Encounter (Signed)
Pt notified of Dr. Tower's comments and verbalized understanding  

## 2024-01-20 NOTE — Telephone Encounter (Signed)
 That sounds a little better  Thanks for the update Continue medicines as ordered  I expect blood pressure to occasionally be high in order to avoid the lows  Continue to monitor  Follow up if concerns or if dizziness persists or worsens

## 2024-01-20 NOTE — Telephone Encounter (Signed)
 2 of her 3 low blood pressure  medicines are labeled to hold for systolic over 120  Per last note Droxidopa  100 mg tid (hold for systolic over 120) Minodrine 10 mg tid (hold for systolic over 120)     When blood pressure was elevated- had she taken these or not? No she held her meds, she checks her BP TID and they were both held due to high BP readings How is blood pressure today (sitting/standing): Today's BP readings starting with this morning and checked through out the day. 133/77, 173/120, 103/60, 104/66  How is she feeling ? No sxs she feels fine Any other new findings or symptoms? Pt said she has a small amount of dizziness but it's not bad and doesn't last long and it's really not new

## 2024-01-20 NOTE — Telephone Encounter (Signed)
 Last filled 12/22/23 #30 tab/ 0 refills  Last OV was 11/28/23 rehab f/u  No future appts

## 2024-01-26 ENCOUNTER — Telehealth: Payer: Self-pay | Admitting: Family Medicine

## 2024-01-26 DIAGNOSIS — F32A Depression, unspecified: Secondary | ICD-10-CM

## 2024-01-26 DIAGNOSIS — K219 Gastro-esophageal reflux disease without esophagitis: Secondary | ICD-10-CM

## 2024-01-26 NOTE — Telephone Encounter (Unsigned)
 Copied from CRM #[id number]. Topic: Clinical - Medication Refill >> Jan 26, 2024  4:20 PM Thersia C wrote: Medication: pantoprazole  (PROTONIX ) 40 MG tablet FLUoxetine  (PROZAC ) 20 MG capsule   Has the patient contacted their pharmacy? Yes (Agent: If no, request that the patient contact the pharmacy for the refill. If patient does not wish to contact the pharmacy document the reason why and proceed with request.) (Agent: If yes, when and what did the pharmacy advise?)  This is the patient's preferred pharmacy:  [facility] [address], [place] - [address] [place] [address] Phone: [phone] Fax: [phone]    Is this the correct pharmacy for this prescription? Yes If no, delete pharmacy and type the correct one.   Has the prescription been filled recently? No  Is the patient out of the medication? Yes  Has the patient been seen for an appointment in the last year OR does the patient have an upcoming appointment? Yes  Can we respond through MyChart? Yes  Agent: Please be advised that Rx refills may take up to 3 business days. We ask that you follow-up with your pharmacy.

## 2024-01-27 MED ORDER — FLUOXETINE HCL 20 MG PO CAPS: 20.0000 mg | ORAL_CAPSULE | Freq: Every day | ORAL | 1 refills | Status: AC

## 2024-01-27 MED ORDER — PANTOPRAZOLE SODIUM 40 MG PO TBEC: 40.0000 mg | DELAYED_RELEASE_TABLET | Freq: Every day | ORAL | 1 refills | Status: AC

## 2024-01-29 ENCOUNTER — Other Ambulatory Visit: Payer: Self-pay

## 2024-01-29 DIAGNOSIS — E7800 Pure hypercholesterolemia, unspecified: Secondary | ICD-10-CM

## 2024-02-03 ENCOUNTER — Encounter: Payer: Self-pay | Admitting: *Deleted

## 2024-02-03 NOTE — Progress Notes (Signed)
 Jireh Elmore T. Myrl Lazarus, MD, CAQ Sports Medicine [facility] [address] [place], [address]  Phone: [phone]  FAX: [phone]  Diana Cook - 73 y.o. female  MRN [id number]  Date of Birth: 06/15/1950  Date: 02/04/2024  PCP: Randeen Laine LABOR, MD  Referral: Randeen Laine LABOR, MD  Chief Complaint  Patient presents with   Knee Pain    Bilateral but Right Knee is Worse    Subjective:   Diana Cook is a 73 y.o. very pleasant female patient with Body mass index is 26.43 kg/m. who presents with the following:  Discussed the use of AI scribe software for clinical note transcription with the patient, who gave verbal consent to proceed.  Patient presents with ongoing knee pain.  I last saw her May 13, 2023, with bilateral knee osteoarthritis flareup and pain.  At that time, right-sided knee x-rays showed severe osteoarthritis with bone-on-bone pathology.  At that time, also gave her bilateral intra-articular steroid injections.  10/29/2023 - Dr. Cleotilde -intra-articular injection History of Present Illness Diana Cook is a 73 year old female with severe arthritis and blood pressure issues who presents with knee pain and dizziness.  She reports significant knee pain and states that Dr. Cleotilde described her condition as 'bone on bone'. Steroid injections in March and September did not provide relief.  She is interested in viscosupplementation.  She wants to reduce her knee pain to avoid being confined to a wheelchair and to regain mobility with a walker.  She has advanced osteoarthritis of the right knee.  Radiographs from 2024 reviewed which showed severe arthritis of the right knee.  She experiences dizziness and blood pressure issues, leading to multiple hospitalizations and stays at [facility] since June.   Her current medications include amiodarone and Eliquis . She cannot take anti-inflammatory medications due to her blood pressure  management and blood thinners. She is on four liters of oxygen .    Review of Systems is noted in the HPI, as appropriate  Objective:   BP 90/60   Pulse 85   Temp 98 F (36.7 C) (Temporal)   Ht 5' 9 (1.753 m)   Wt 179 lb (81.2 kg) Comment: Patient reported  SpO2 94% Comment: 4 L O2 Nasal Canula  BMI 26.43 kg/m   GEN: No acute distress; alert,appropriate. PULM: Breathing comfortably in no respiratory distress PSYCH: Normally interactive.    Examined in a wheelchair Medial lateral joint line tenderness bilaterally Minimal to mild effusion Ligamentous structures intact  Laboratory and Imaging Data:  Assessment and Plan:     ICD-10-CM   1. Primary osteoarthritis of knees, bilateral  M17.0 triamcinolone  acetonide (KENALOG -40) injection 40 mg    triamcinolone  acetonide (KENALOG -40) injection 40 mg    2. Chronic hypoxic respiratory failure (HCC)  J96.11     3. Chronic heart failure with preserved ejection fraction (HFpEF) (HCC)  I50.32     4. Chronic orthostatic hypotension  I95.1      Assessment & Plan Primary osteoarthritis of knees, bilateral Severe bilateral knee osteoarthritis with significant pain and functional impairment.  Extremely high risk patient from a surgical standpoint.  Current medications preclude anti-inflammatory drugs, complicating pain management. - Administered bilateral knee steroid injections today. - Check insurance coverage for viscosupplementation benefits and preferred products. - Discussed potential benefits and limitations of gel injections, including the possibility of no relief due to severe arthritis.  Aspiration/Injection Procedure Note Diana Cook 09-Jun-1950 Date of  procedure: 02/04/2024  Procedure: Large Joint Joint Aspiration / Injection of the Right Knee Indications: Pain  Procedure Details Patient verbally consented to procedure. Risks, benefits, and alternatives explained. Sterilely prepped with Chloraprep. Ethyl cholride  used for anesthesia. 9 cc Lidocaine  1% mixed with 1 mL Kenalog  40 mg injected using the anteromedial approach without difficulty. No complications with procedure and tolerated well. Patient had decreased pain post-injection.  Medication: 1 mL of Kenalog  40 mg  Aspiration/Injection Procedure Note Diana Cook 1950/07/09 Date of procedure: 02/04/2024  Procedure: Large Joint Aspiration / Injection of the Left Knee Indications: Pain  Procedure Details Patient verbally consented to procedure. Risks, benefits, and alternatives explained. Sterilely prepped with Chloraprep. Ethyl cholride used for anesthesia. 9 cc Lidocaine  1% mixed with 1 mL Kenalog  40 mg injected using the anteromedial approach without difficulty. No complications with procedure and tolerated well. Patient had decreased pain post-injection.  Medication: 1 mL of Kenalog  40 mg   Medication Management during today's office visit: Meds ordered this encounter  Medications   triamcinolone  acetonide (KENALOG -40) injection 40 mg   triamcinolone  acetonide (KENALOG -40) injection 40 mg   There are no discontinued medications.  Orders placed today for conditions managed today: No orders of the defined types were placed in this encounter.   Disposition: No follow-ups on file.  Dr[employer] One speech-to-text software was used for transcription in this dictation.  Possible transcriptional errors can occur using Animal nutritionist.   Signed,  Jacques DASEN. Jacquelynne Guedes, MD   Outpatient Encounter Medications as of 02/04/2024  Medication Sig   acetaminophen  (TYLENOL ) 500 MG tablet Take 1,000 mg by mouth in the morning and at bedtime.   albuterol  (VENTOLIN  HFA) 108 (90 Base) MCG/ACT inhaler Inhale 2 puffs into the lungs every 4 (four) hours as needed for wheezing or shortness of breath.   apixaban  (ELIQUIS ) 5 MG TABS tablet Take 1 tablet (5 mg total) by mouth 2 (two) times daily.   aspirin  EC 81 MG tablet Take 1 tablet (81 mg total) by mouth  daily.   atorvastatin  (LIPITOR) 40 MG tablet Take 1 tablet (40 mg total) by mouth daily.   benzonatate  (TESSALON ) 200 MG capsule Take 1 capsule (200 mg total) by mouth 2 (two) times daily as needed for cough.   Cholecalciferol  (VITAMIN D3) 50 MCG (2000 UT) capsule Take 2,000 Units by mouth daily.   droxidopa  (NORTHERA ) 100 MG CAPS Take 1 capsule (100 mg total) by mouth 3 (three) times daily with meals. Hold for systolic blood pressure over 120   FLUoxetine  (PROZAC ) 20 MG capsule Take 1 capsule (20 mg total) by mouth daily.   ipratropium-albuterol  (DUONEB) 0.5-2.5 (3) MG/3ML SOLN Take 3 mLs by nebulization every 6 (six) hours as needed.   iron  polysaccharides (NIFEREX) 150 MG capsule Take 1 capsule (150 mg total) by mouth daily.   levETIRAcetam  (KEPPRA ) 250 MG tablet Take 1 tablet (250 mg total) by mouth 2 (two) times daily.   lidocaine  4 % Place 1 patch onto the skin daily.   midodrine  (PROAMATINE ) 10 MG tablet Take 1 tablet (10 mg total) by mouth 3 (three) times daily with meals. Hold for systolic blood pressure over 120   Nebulizers (COMPRESSOR/NEBULIZER) MISC For use with medication for the lungs.  Use as directed.   ondansetron  (ZOFRAN ) 8 MG tablet Take 1 tablet (8 mg total) by mouth every 8 (eight) hours as needed for nausea or vomiting.   OXYGEN  Inhale 4 L into the lungs continuous.   pantoprazole  (PROTONIX ) 40 MG  tablet Take 1 tablet (40 mg total) by mouth daily.   pyridostigmine  (MESTINON ) 60 MG tablet Take 1 tablet (60 mg total) by mouth 3 (three) times daily.   sodium chloride  1 g tablet Take 1 tablet (1 g total) by mouth 2 (two) times daily with a meal.   zolpidem  (AMBIEN ) 10 MG tablet TAKE 1/2 TO 1 (ONE-HALF TO ONE) TABLET BY MOUTH AT BEDTIME AS NEEDED FOR SLEEP   [EXPIRED] triamcinolone  acetonide (KENALOG -40) injection 40 mg    [EXPIRED] triamcinolone  acetonide (KENALOG -40) injection 40 mg    No facility-administered encounter medications on file as of 02/04/2024.

## 2024-02-04 ENCOUNTER — Ambulatory Visit: Admitting: Family Medicine

## 2024-02-04 ENCOUNTER — Encounter: Payer: Self-pay | Admitting: Family Medicine

## 2024-02-04 VITALS — BP 90/60 | HR 85 | Temp 98.0°F | Ht 69.0 in | Wt 179.0 lb

## 2024-02-04 DIAGNOSIS — M170 Bilateral primary osteoarthritis of knee: Secondary | ICD-10-CM | POA: Diagnosis not present

## 2024-02-04 DIAGNOSIS — J9611 Chronic respiratory failure with hypoxia: Secondary | ICD-10-CM

## 2024-02-04 DIAGNOSIS — I951 Orthostatic hypotension: Secondary | ICD-10-CM

## 2024-02-04 DIAGNOSIS — I5032 Chronic diastolic (congestive) heart failure: Secondary | ICD-10-CM

## 2024-02-04 MED ORDER — TRIAMCINOLONE ACETONIDE 40 MG/ML IJ SUSP
40.0000 mg | Freq: Once | INTRAMUSCULAR | Status: AC
  Administered 2024-02-04: 40 mg via INTRA_ARTICULAR

## 2024-02-08 ENCOUNTER — Telehealth: Payer: Self-pay

## 2024-02-08 NOTE — Telephone Encounter (Signed)
 Benefit request submitted for Durolane gel injection for both knees. Preferred gel for [facility] Medicare.  Will reach out to provider when benefit coverage is provided.

## 2024-02-09 NOTE — Telephone Encounter (Signed)
 Specialist office visits, Durolane, and procedures are covered at 100% of the allowable.  Deductible and out of pocket have been met, coverage is now at 100%.  No pre-cert or referrals needed.  Medical notes may be requested at the time of claims processing.  Provider is in Network.  **Plan renews on 1.1.26. Benefits for dates of service after 1.1.26 may vary**  Summary of benefits has been placed in the providers box for review.

## 2024-02-09 NOTE — Telephone Encounter (Signed)
 Can you let her know.  Covered now at 100%, but that will change 02/25/2024.  Probably would want to schedule in year 2025.

## 2024-02-10 NOTE — Telephone Encounter (Signed)
 Patient read her MyChart message: Last read by Coralee JAYSON Hurst at 11:55AM on 02/09/2024.

## 2024-02-10 NOTE — Telephone Encounter (Signed)
 Can you call her? It looks like she does not really use mychart.

## 2024-02-12 ENCOUNTER — Other Ambulatory Visit: Payer: Self-pay | Admitting: Family Medicine

## 2024-02-12 DIAGNOSIS — I951 Orthostatic hypotension: Secondary | ICD-10-CM

## 2024-02-12 NOTE — Telephone Encounter (Unsigned)
 Copied from CRM #[id number]. Topic: Clinical - Medication Refill >> Feb 12, 2024  2:37 PM Joesph B wrote: Medication: droxidopa  (NORTHERA ) 100 MG CAPS  Has the patient contacted their pharmacy? Yes (Agent: If no, request that the patient contact the pharmacy for the refill. If patient does not wish to contact the pharmacy document the reason why and proceed with request.) (Agent: If yes, when and what did the pharmacy advise?)  This is the patient's preferred pharmacy:  [facility] [address] [place] - [address] [place] [address] Phone: [phone] Fax: [phone]    Is this the correct pharmacy for this prescription? Yes If no, delete pharmacy and type the correct one.   Has the prescription been filled recently? Yes  Is the patient out of the medication? Yes  Has the patient been seen for an appointment in the last year OR does the patient have an upcoming appointment? Yes  Can we respond through MyChart? Yes  Agent: Please be advised that Rx refills may take up to 3 business days. We ask that you follow-up with your pharmacy.

## 2024-02-15 ENCOUNTER — Other Ambulatory Visit (HOSPITAL_COMMUNITY): Payer: Self-pay

## 2024-02-15 ENCOUNTER — Telehealth: Payer: Self-pay

## 2024-02-15 NOTE — Telephone Encounter (Signed)
 Pt has refills on file with pharmacy, called [facility] to see if they can refill med and they said that they do have the droxidopa  refill however it needs a PA before they can refill it again. Will route to PA dpt to start PA process

## 2024-02-15 NOTE — Telephone Encounter (Signed)
 Pharmacy Patient Advocate Encounter  Received notification from [facility] that Prior Authorization for Droxidopa  100MG  capsules  has been APPROVED from 02/15/2024 to 02/23/2025. Ran test claim, Copay is $0. This test claim was processed through [facility]- copay amounts may vary at other pharmacies due to pharmacy/plan contracts, or as the patient moves through the different stages of their insurance plan.   PA #/Case ID/Reference #: [id number]

## 2024-02-15 NOTE — Telephone Encounter (Signed)
 Pharmacy Patient Advocate Encounter   Received notification from Physician's Office that prior authorization for Droxidopa  100MG  capsules is required/requested.   Insurance verification completed.   The patient is insured through [facility].   Per test claim: PA required; PA submitted to above mentioned insurance via Latent Key/confirmation #/EOC [facility] Status is pending

## 2024-02-15 NOTE — Telephone Encounter (Signed)
Left VM letting pharmacy know PA was approved

## 2024-02-22 ENCOUNTER — Other Ambulatory Visit: Payer: Self-pay | Admitting: Family Medicine

## 2024-02-22 DIAGNOSIS — I951 Orthostatic hypotension: Secondary | ICD-10-CM

## 2024-02-22 DIAGNOSIS — E871 Hypo-osmolality and hyponatremia: Secondary | ICD-10-CM

## 2024-02-22 NOTE — Telephone Encounter (Unsigned)
 Copied from CRM [id number]. Topic: Clinical - Medication Refill >> Feb 22, 2024  3:24 PM Suzen RAMAN wrote: Medication: sodium chloride  1 g tablet(previous sent 5 pills but patient takes everyday) droxidopa  100 mg capsules zolpidem  (AMBIEN ) 10 MG tablet  Has the patient contacted their pharmacy? Yes   This is the patient's preferred pharmacy:  [facility] [address] [place] - [address] [place] [address] Phone: [phone] Fax: [phone]  Is this the correct pharmacy for this prescription? Yes If no, delete pharmacy and type the correct one.   Has the prescription been filled recently? No  Is the patient out of the medication? Yes(droxidopa  (NORTHERA ) 100 MG CAPS), others patient has 3-4 left  Has the patient been seen for an appointment in the last year OR does the patient have an upcoming appointment? Yes  Can we respond through MyChart? Yes  Agent: Please be advised that Rx refills may take up to 3 business days. We ask that you follow-up with your pharmacy.

## 2024-02-23 ENCOUNTER — Other Ambulatory Visit: Payer: Self-pay | Admitting: Family Medicine

## 2024-02-23 MED ORDER — DROXIDOPA 100 MG PO CAPS: 100.0000 mg | ORAL_CAPSULE | Freq: Three times a day (TID) | ORAL | 5 refills | Status: AC

## 2024-02-23 MED ORDER — SODIUM CHLORIDE 1 G PO TABS: 1.0000 g | ORAL_TABLET | Freq: Two times a day (BID) | ORAL | 1 refills | Status: AC

## 2024-02-23 NOTE — Telephone Encounter (Signed)
 Last OV: 12/29/23 Pending OV: Nothing scheduled at this time Medication: zolpidem  10mg  Directions: Take 0.5-1 tablet at bedtime prn Last Refill: 01/21/24 Qty: #30 with 0 refills

## 2024-02-26 ENCOUNTER — Encounter

## 2024-03-14 ENCOUNTER — Other Ambulatory Visit: Payer: Self-pay | Admitting: Internal Medicine

## 2024-03-14 ENCOUNTER — Ambulatory Visit

## 2024-03-14 DIAGNOSIS — I442 Atrioventricular block, complete: Secondary | ICD-10-CM

## 2024-03-14 DIAGNOSIS — E7800 Pure hypercholesterolemia, unspecified: Secondary | ICD-10-CM

## 2024-03-15 LAB — CUP PACEART REMOTE DEVICE CHECK
Battery Remaining Longevity: 114 mo
Battery Remaining Percentage: 100 %
Brady Statistic RA Percent Paced: 24 %
Brady Statistic RV Percent Paced: 100 %
Date Time Interrogation Session: 20260119011200
Implantable Lead Connection Status: 753985
Implantable Lead Connection Status: 753985
Implantable Lead Implant Date: 20130830
Implantable Lead Implant Date: 20130830
Implantable Lead Location: 753859
Implantable Lead Location: 753860
Implantable Lead Model: 4456
Implantable Lead Model: 4479
Implantable Lead Serial Number: 473325
Implantable Lead Serial Number: 523784
Implantable Pulse Generator Implant Date: 20211011
Lead Channel Impedance Value: 438 Ohm
Lead Channel Impedance Value: 610 Ohm
Lead Channel Pacing Threshold Amplitude: 0.4 V
Lead Channel Pacing Threshold Pulse Width: 0.4 ms
Lead Channel Setting Pacing Amplitude: 2 V
Lead Channel Setting Pacing Amplitude: 2 V
Lead Channel Setting Pacing Pulse Width: 0.4 ms
Lead Channel Setting Sensing Sensitivity: 2.5 mV
Pulse Gen Serial Number: 949303
Zone Setting Status: 755011

## 2024-03-15 NOTE — Telephone Encounter (Signed)
 Will need to run benefits again giving its a new calendar year.  A message was sent to patient back in December that she was covered at 100% and to call the office to schedule.   Patient read that MyChart message on Last read by Coralee JAYSON Hurst at 11:55AM on 02/09/2024.

## 2024-03-15 NOTE — Telephone Encounter (Signed)
 Patient called back to check on the status of the Durolane gel injection for both knees. Patient was made aware of chart notation pertaining to injection via voicemail. Per patient inquiry it doesn't indicate she accessed the details via MyChart. Patient stated she had not heard from any one in regards to injections which is why she called. Unsure if this needs to be re-submit to insurance for coverage before scheduling another appointment. Please contact patient to further assist. OB call made back to patient unsuccessful.    CB#[phone]

## 2024-03-15 NOTE — Telephone Encounter (Signed)
 Can you please let her know we will run her benefits again.

## 2024-03-16 ENCOUNTER — Ambulatory Visit: Attending: Medical | Admitting: Medical

## 2024-03-16 ENCOUNTER — Encounter: Payer: Self-pay | Admitting: Medical

## 2024-03-16 ENCOUNTER — Telehealth: Payer: Self-pay | Admitting: Internal Medicine

## 2024-03-16 VITALS — BP 105/64 | HR 64 | Ht 68.5 in | Wt 172.0 lb

## 2024-03-16 DIAGNOSIS — R55 Syncope and collapse: Secondary | ICD-10-CM | POA: Diagnosis not present

## 2024-03-16 DIAGNOSIS — I6523 Occlusion and stenosis of bilateral carotid arteries: Secondary | ICD-10-CM

## 2024-03-16 DIAGNOSIS — I951 Orthostatic hypotension: Secondary | ICD-10-CM | POA: Diagnosis not present

## 2024-03-16 DIAGNOSIS — I2510 Atherosclerotic heart disease of native coronary artery without angina pectoris: Secondary | ICD-10-CM | POA: Diagnosis not present

## 2024-03-16 DIAGNOSIS — I480 Paroxysmal atrial fibrillation: Secondary | ICD-10-CM | POA: Diagnosis not present

## 2024-03-16 DIAGNOSIS — I442 Atrioventricular block, complete: Secondary | ICD-10-CM | POA: Diagnosis not present

## 2024-03-16 NOTE — Patient Instructions (Signed)
 Medication Instructions:  Your physician recommends that you continue on your current medications as directed. Please refer to the Current Medication list given to you today.    *If you need a refill on your cardiac medications before your next appointment, please call your pharmacy*  Lab Work: No labs ordered today    Testing/Procedures: No test ordered today   Follow-Up: At [facility], you and your health needs are our priority.  As part of our continuing mission to provide you with exceptional heart care, our providers are all part of one team.  This team includes your primary Cardiologist (physician) and Advanced Practice Providers or APPs (Physician Assistants and Nurse Practitioners) who all work together to provide you with the care you need, when you need it.  Your next appointment:   6 month(s)  Provider:   Lonni Hanson, MD or Cadence Franchester, PA-C

## 2024-03-16 NOTE — Telephone Encounter (Signed)
 Left MyChart message letting patient know that I will re-run gel injection benefits, and that we will reach out to her once coverage has been verified.

## 2024-03-16 NOTE — Telephone Encounter (Signed)
 Benefit request submitted for Durolane gel injection of both knees. Preferred gel for [facility] Medicare.  Will reach out to provider when benefit coverage is provided.

## 2024-03-16 NOTE — Progress Notes (Signed)
 " Cardiology Office Note   Date:  03/16/2024  ID:  Diana Cook, DOB [birth year], MRN [id number] PCP: Randeen Laine LABOR, MD  [facility] Providers Cardiologist:  Lonni Hanson, MD Electrophysiologist:  OLE ONEIDA HOLTS, MD (Inactive)  Electrophysiology APP:  Riddle, Suzann, NP   History of Present Illness Diana Cook is a 74 y.o. female ith a history of moderate coronary artery disease by cardiac CTA in 11/2018 and cardiac cath in 06/2019, carotid artery stenosis status post right carotid stenting 04/2022, complete heart block status post permanent pacemaker followed by EP, paroxysmal A-fib, seizure disorder, COPD on 4LO2, and right breast cancer who presents for follow-up of syncope, CAD and atrial fibrillation.   Patient was seen 06/2023 reporting weight gain, fatigue, intermittent confusion.  She felt it was Eliquis , but after discussion it was decided symptoms started before medication. Echo showed LVEF 55-60%, no WMA, G1DD.    The patient was admitted early July 2025 with syncope from orthostatic hypotension, UTI, hypokalemia, hypomagnesemia. Patient had diarrhea for the week beforehand. Cortisol elvels were normal. Patient was started on midodrine  and florinef  and given IVF. It was later felt Florinef  did not help and it was discontinued. Patient was discharged on midodrine  10mg  TID and recommended wearing abdominal binder, TED hose and increased oral hydration.   She was seen in the ER 09/14/23 for orthostatic hypotension and syncope. She was restarted on Florinef .   Patient was admitted in August 2025 with orthostatic hypotension and syncope.  She was started on Mestinon  60mg  3 times daily.  She was continued on midodrine  and Florinef .  Pacemaker was working normally.  Patient was admitted again in October 2025 with syncope and orthostatic hypotension.  She was not taking pyridostigmine , wearing abdominal binder or staying hydrated.  She was started on TED hose, abdominal binder,  and good hydration.  Pacemaker interrogation was unremarkable.  She was discharged on florinef  0.1mg  BID, midodrine  10mg  TID, pyridostigamine 60mg  TID, and Northera  100mg  TID. She went to rehab and they recommended wheelchair.   Today, the patient reports she has has any further syncope. She still gets dizzy when she stands up, but has not passed out. She says she is no longer taking florinef . She is not taking salt tablets daily. She is on 4L O2. She denies chest pain. She does minimal activity at home as she is mostly in a wheelchair. She has a walker she uses occasionally. She is using ted hose and abdominal binder.  Studies Reviewed      Echo 07/2023 1. Left ventricular ejection fraction, by estimation, is 55 to 60%. Left  ventricular ejection fraction by 2D MOD biplane is 56.1 %. The left  ventricle has normal function. The left ventricle has no regional wall  motion abnormalities. Left ventricular  diastolic parameters are consistent with Grade I diastolic dysfunction  (impaired relaxation).   2. Right ventricular systolic function is normal. The right ventricular  size is normal.   3. The mitral valve is normal in structure. Trivial mitral valve  regurgitation.   4. The aortic valve is tricuspid. Aortic valve regurgitation is not  visualized.    Carotid US  05/2023 Vertebrals:  Bilateral vertebral arteries demonstrate antegrade flow.  Subclavians: Normal flow hemodynamics were seen in bilateral subclavian arteries.    2d echo 07/16/21   1. Left ventricular ejection fraction, by estimation, is 55 to 60%. The  left ventricle has normal function. Septal wall motion abnormality  consistent with bundle branch block.   2.  There is mild left ventricular hypertrophy. Left ventricular diastolic  parameters are consistent with Grade I diastolic dysfunction (impaired  relaxation).   3. Right ventricular systolic function is normal. The right ventricular  size is normal. There is normal  pulmonary artery systolic pressure. The  estimated right ventricular systolic pressure is 31.6 mmHg.   4. The mitral valve is normal in structure. No evidence of mitral valve  regurgitation. No evidence of mitral stenosis.   5. The aortic valve was not well visualized. Aortic valve regurgitation  is not visualized. No aortic stenosis is present.   6. The inferior vena cava is normal in size with greater than 50%  respiratory variability, suggesting right atrial pressure of 3 mmHg.         Physical Exam VS:  BP 105/64 (BP Location: Left Arm, Patient Position: Sitting, Cuff Size: Normal)   Pulse 64   Ht 5' 8.5 (1.74 m)   Wt 172 lb (78 kg)   SpO2 92%   BMI 25.77 kg/m        Wt Readings from Last 3 Encounters:  03/16/24 172 lb (78 kg)  02/04/24 179 lb (81.2 kg)  12/24/23 185 lb 3.2 oz (84 kg)    GEN: Well nourished, well developed in no acute distress NECK: No JVD; No carotid bruits CARDIAC: RRR, no murmurs, rubs, gallops RESPIRATORY:  Clear to auscultation without rales, wheezing or rhonchi  ABDOMEN: Soft, non-tender, non-distended EXTREMITIES:  No edema; No deformity   ASSESSMENT AND PLAN  Recurrent syncope Orthostatic hypotension The patient was discharged to rehab and has been doing well. They recommended mostly wheelchair use. She denies any further syncope, but still gets dizzy if she stands up. She reports Floroinef was stopped, but will call to confirm. Continue Northera  100mg  TID, midodrine  10mg  TID, and pyridostigmine  60mg  TID.  Paroxysmal afib Continue Eliquis  5mg  BID  CHB s/p PPM We will get her into see Dr. Kennyth. Most recent device check was normal.   Carotid artery disease S/p right carotid stent. Recent US  showed normal blood flow.        Dispo: Follow-up in 6 months  Signed, Jarrett Chicoine VEAR Fishman, PA-C   "

## 2024-03-16 NOTE — Telephone Encounter (Signed)
 Patient calling back to provide the name of the medicaiton that was taking off [email or web address] 10ER. Please advise

## 2024-03-17 NOTE — Telephone Encounter (Signed)
 Spoke with Diana Cook and gave her the break down for the Durolane injections.  She wants to hold off for now until she can meet her out of pocket max of $900 so she will be covered at 100%.  She ask when she could get another cortisone injection and I let her know the earliest would be 05/04/2024.  FYI to Dr. Watt.

## 2024-03-17 NOTE — Telephone Encounter (Signed)
 Spoke with pt who confirmed she is not taking florinef . Will forward to Cadence to make aware.   Franchester, Cadence H, PA-C to Reavis, Angela D, RN    03/17/24  2:22 PM We need to know if she is taking florinef . thanks

## 2024-03-17 NOTE — Telephone Encounter (Signed)
 Patient has fully funded Medicare PPO Calendar year plan with and effective date of 1.1.26.  -Plan covers 85% of allowable amount for Durolane and 100% of allowable amount for procedure.  -Deductible must be met before coverage applies. Deductible is $125.00 -Patient has a $40 co-pay whether or not an office visit is billed. -If out of pocket of $900.00 is met, coverage goes to 100% and copay will no longer apply. -$101.60 of $900 has been met. -No  Pre-cert or referrals needed.  -Medical notes may be requested at the time of claims processing.  -Provider is in network.  Benefit summary placed in providers box for review.

## 2024-03-18 NOTE — Progress Notes (Signed)
 Remote PPM Transmission

## 2024-03-20 ENCOUNTER — Ambulatory Visit: Payer: Self-pay | Admitting: Cardiology

## 2024-03-21 MED ORDER — ATORVASTATIN CALCIUM 40 MG PO TABS: 40.0000 mg | ORAL_TABLET | Freq: Every day | ORAL | 0 refills | Status: AC

## 2024-03-21 NOTE — Telephone Encounter (Signed)
 Patient had lipid done on 03/18/23, found in Care Everywhere.

## 2024-03-22 ENCOUNTER — Other Ambulatory Visit: Payer: Self-pay | Admitting: Family Medicine

## 2024-03-22 NOTE — Telephone Encounter (Signed)
 Last OV: 12/29/23 Pending OV: CPE 03/25/24 Medication: zolpidem  10mg  Directions: Take 0.5-1 tablet at bedtime prn Last Refill: 02/23/24 Qty: #30 with 0 refills

## 2024-03-25 ENCOUNTER — Ambulatory Visit: Admitting: Family Medicine

## 2024-03-25 ENCOUNTER — Encounter: Payer: Self-pay | Admitting: Family Medicine

## 2024-03-25 VITALS — BP 122/60 | HR 72 | Temp 97.7°F

## 2024-03-25 DIAGNOSIS — N1831 Chronic kidney disease, stage 3a: Secondary | ICD-10-CM

## 2024-03-25 DIAGNOSIS — D509 Iron deficiency anemia, unspecified: Secondary | ICD-10-CM

## 2024-03-25 DIAGNOSIS — R944 Abnormal results of kidney function studies: Secondary | ICD-10-CM | POA: Diagnosis not present

## 2024-03-25 DIAGNOSIS — E871 Hypo-osmolality and hyponatremia: Secondary | ICD-10-CM

## 2024-03-25 DIAGNOSIS — R569 Unspecified convulsions: Secondary | ICD-10-CM

## 2024-03-25 DIAGNOSIS — F32A Depression, unspecified: Secondary | ICD-10-CM | POA: Diagnosis not present

## 2024-03-25 DIAGNOSIS — E559 Vitamin D deficiency, unspecified: Secondary | ICD-10-CM | POA: Diagnosis not present

## 2024-03-25 DIAGNOSIS — I5032 Chronic diastolic (congestive) heart failure: Secondary | ICD-10-CM

## 2024-03-25 DIAGNOSIS — M810 Age-related osteoporosis without current pathological fracture: Secondary | ICD-10-CM

## 2024-03-25 DIAGNOSIS — J432 Centrilobular emphysema: Secondary | ICD-10-CM

## 2024-03-25 DIAGNOSIS — R5382 Chronic fatigue, unspecified: Secondary | ICD-10-CM | POA: Diagnosis not present

## 2024-03-25 DIAGNOSIS — K219 Gastro-esophageal reflux disease without esophagitis: Secondary | ICD-10-CM | POA: Diagnosis not present

## 2024-03-25 DIAGNOSIS — Z0000 Encounter for general adult medical examination without abnormal findings: Secondary | ICD-10-CM

## 2024-03-25 DIAGNOSIS — Z79899 Other long term (current) drug therapy: Secondary | ICD-10-CM

## 2024-03-25 DIAGNOSIS — Z95811 Presence of heart assist device: Secondary | ICD-10-CM

## 2024-03-25 DIAGNOSIS — I480 Paroxysmal atrial fibrillation: Secondary | ICD-10-CM

## 2024-03-25 DIAGNOSIS — G901 Familial dysautonomia [Riley-Day]: Secondary | ICD-10-CM

## 2024-03-25 DIAGNOSIS — E7800 Pure hypercholesterolemia, unspecified: Secondary | ICD-10-CM | POA: Diagnosis not present

## 2024-03-25 DIAGNOSIS — I951 Orthostatic hypotension: Secondary | ICD-10-CM

## 2024-03-25 DIAGNOSIS — J9611 Chronic respiratory failure with hypoxia: Secondary | ICD-10-CM | POA: Diagnosis not present

## 2024-03-25 DIAGNOSIS — Z1211 Encounter for screening for malignant neoplasm of colon: Secondary | ICD-10-CM

## 2024-03-25 NOTE — Assessment & Plan Note (Signed)
 Diana Cook

## 2024-03-25 NOTE — Assessment & Plan Note (Signed)
 [name]

## 2024-03-25 NOTE — Assessment & Plan Note (Signed)
 PHQ 6 Frustrated by medical conditions/copd Continues fluoxetine  which has been helpful  ? Consider increase dose Declines counseling for now

## 2024-03-25 NOTE — Assessment & Plan Note (Signed)
 Reviewed health habits including diet and exercise and skin cancer prevention Reviewed appropriate screening tests for age  Also reviewed health mt list, fam hx and immunization status , as well as social and family history   See HPI Labs reviewed and ordered Health Maintenance  Topic Date Due   Colon Cancer Screening  08/18/2021   Medicare Annual Wellness Visit  03/05/2024   COVID-19 Vaccine (5 - 2025-26 season) 11/13/2024*   DTaP/Tdap/Td vaccine (3 - Tdap) 03/25/2025*   Zoster (Shingles) Vaccine (1 of 2) 06/23/2025*   Breast Cancer Screening  01/06/2025   Pneumococcal Vaccine for age over 78  Completed   Flu Shot  Completed   Osteoporosis screening with Bone Density Scan  Completed   Hepatitis C Screening  Completed   Meningitis B Vaccine  Aged Out   Screening for Lung Cancer  Discontinued  *Topic was postponed. The date shown is not the original due date.    Declines recall colonoscopy due to age and medical risks  Discussed fall prevention, supplements and exercise for bone density  No recent fractures PHQ 6-discussed symptoms of depression in setting of medical problems

## 2024-03-25 NOTE — Patient Instructions (Addendum)
 I want you to get stronger  Add some strength training to your routine, this is important for bone and brain health and can reduce your risk of falls and help your body use insulin properly and regulate weight  Light weights, exercise bands , and internet videos are a good way to start  Yoga (chair or regular), machines , floor exercises or a gym with machines are also good options    Continue current medicines   Labs today for chronic medical problems and fatigue

## 2024-03-25 NOTE — Progress Notes (Unsigned)
 "  Subjective:    Patient ID: Diana Cook, female    DOB: 1950-10-28, 74 y.o.   MRN: [id number]  HPI  Here for health maintenance exam and to review chronic medical problems   Wt Readings from Last 3 Encounters:  03/16/24 172 lb (78 kg)  02/04/24 179 lb (81.2 kg)  12/24/23 185 lb 3.2 oz (84 kg)      Vitals:   03/25/24 1439  BP: 122/60  Pulse: 72  Temp: 97.7 F (36.5 C)  SpO2: 90%    Immunization History  Administered Date(s) Administered    sv, Bivalent, Protein Subunit Rsvpref,pf (Abrysvo) 12/31/2022   Fluad Quad(high Dose 65+) 10/24/2020   INFLUENZA, HIGH DOSE SEASONAL PF 11/04/2018, 11/22/2019, 12/31/2022, 12/15/2023   Influenza Whole 02/24/2005   Influenza,inj,Quad PF,6+ Mos 12/03/2012, 12/09/2013, 01/16/2015, 02/06/2016, 11/04/2016, 12/08/2017   Influenza-Unspecified 10/25/2021   PFIZER Comirnaty(Gray Top)Covid-19 Tri-Sucrose Vaccine 07/06/2020   PFIZER(Purple Top)SARS-COV-2 Vaccination 02/28/2019, 03/21/2019, 11/14/2019   PNEUMOCOCCAL CONJUGATE-20 07/06/2020   PPD Test 12/08/2023, 12/17/2023   Pneumococcal Conjugate-13 07/11/2014   Pneumococcal Polysaccharide-23 03/21/2005, 07/26/2015   RSV,unspecified 12/18/2023   Td 01/09/1999, 06/14/2007   Zoster, Live 09/07/2015    Health Maintenance Due  Topic Date Due   Colonoscopy  08/18/2021   Medicare Annual Wellness (AWV)  03/05/2024     Mammogram 12/2023  Self breast exam- no new lumps / just had surgical visit  Onc signed off   Gyn health-no problems   Mindful of fluid intake    Colon cancer screening  Colonoscopy 2020    3 y recall, pt declined in past /is high risk   Bone health  Dexa -osteopenia / past osteoporosis  09/2022  Falls-one recently with syncope  Fractures-none recent  Supplements -back on D3  Last vitamin D  Lab Results  Component Value Date   VD25OH 40 03/25/2024    Exercise -limited    Mood    03/25/2024    2:42 PM 12/29/2023   11:38 AM 05/08/2023   11:10 AM 03/25/2023     2:04 PM 03/06/2023    1:15 PM  Depression screen PHQ 2/9  Decreased Interest 1 0 0 0 0  Down, Depressed, Hopeless 1 0 0 0 0  PHQ - 2 Score 2 0 0 0 0  Altered sleeping 0 0 0 0   Tired, decreased energy 3 0 1 3   Change in appetite 0 3 0 3   Feeling bad or failure about yourself  1 0 0 1   Trouble concentrating 0 1 0 0   Moving slowly or fidgety/restless 0 0 0 0   Suicidal thoughts 0 0 0 0   PHQ-9 Score 6 4  1  7     Difficult doing work/chores Very difficult Somewhat difficult Not difficult at all       Data saved with a previous flowsheet row definition   Ambien  for sleep prn  Fluoxetine  for mood 20 mg daily   Is very tired all of the time   Not interested in counseling     A fib-under cardiology care  Orthostatic hypotension  BP Readings from Last 3 Encounters:  03/25/24 122/60  03/16/24 105/64  02/04/24 90/60   Pulse Readings from Last 3 Encounters:  03/25/24 72  03/16/24 64  02/04/24 85   Pyridostigmine  60 mg tid  Midodrine  10 mg tid  Droxidopa  100 mg tid   Overall doing better  Occational gets dizzy when standing up  One fall -? If syncope  Occational  forgets her wheel chair (supposed to use it full time)   Did her PT     Lab Results  Component Value Date   NA 141 03/25/2024   K 5.1 03/25/2024   CO2 24 03/25/2024   GLUCOSE 99 03/25/2024   BUN 24 03/25/2024   CREATININE 0.98 03/25/2024   CALCIUM  9.8 03/25/2024   GFR 47.62 (L) 12/29/2023   EGFR 60 03/25/2024   GFRNONAA >60 12/07/2023     Copd  Pulm care  Lung cancer   History of seizures  Keppra  Neuro care    GERD Protonix  40 mg daily   Lab Results  Component Value Date   VITAMINB12 313 03/25/2024     Hyperlipidemia Lab Results  Component Value Date   CHOL 180 03/25/2024   HDL 73 03/25/2024   LDLCALC 88 03/25/2024   TRIG 97 03/25/2024   CHOLHDL 2.5 03/25/2024   Atorvastatin  40 mg daily    Patient Active Problem List   Diagnosis Date Noted   Stage 3a chronic kidney  disease (CKD) (HCC) 03/27/2024   Dysautonomia (HCC) 12/29/2023   DNR (do not resuscitate) 12/15/2023   Weight loss 12/03/2023   Hypotension 12/03/2023   Recurrent syncope 11/30/2023   Personal history of fall 11/27/2023   Iron  deficiency 11/02/2023   Syncope and collapse 09/26/2023   Hypokalemia 09/26/2023   Carotid stenosis s/p stents 09/26/2023   Chronic anticoagulation 09/26/2023   Physical deconditioning 09/18/2023   Syncope 08/29/2023   Chronic orthostatic hypotension 08/28/2023   Decreased GFR 04/29/2023   Presence of heart assist device (HCC) 03/25/2023   Urinary urgency 02/09/2023   Carotid stenosis, symptomatic w/o infarct 03/23/2022   Right shoulder pain 11/25/2021   Anemia 11/03/2021   Thyroid  nodule 09/17/2021   Dyslipidemia 09/05/2021   Depression 09/05/2021   GERD without esophagitis 09/05/2021   Vitamin D  deficiency 08/20/2021   Colon cancer screening 07/30/2021   Hypercalcemia 07/29/2021   Weakness 07/08/2021   Arterial hypotension 07/08/2021   Paroxysmal atrial fibrillation (HCC) 04/11/2021   Current use of proton pump inhibitor 10/24/2020   Sleep apnea 10/24/2020   Fatigue 10/24/2020   Lightheadedness 05/16/2020   Radicular pain in left arm 03/02/2020   Neck pain on left side 02/15/2020   Paresthesia 02/15/2020   Chronic hypoxic respiratory failure (HCC) 11/17/2019   Chronic heart failure with preserved ejection fraction (HFpEF) (HCC) 08/11/2019   Coronary artery disease of native artery of native heart with stable angina pectoris 06/20/2019   Gastroesophageal reflux disease    Stomach irritation    Abnormal CT scan, esophagus    Polyp of ascending colon    Personal history of tobacco use, presenting hazards to health 01/18/2018   Grade I diastolic dysfunction 12/21/2017   Globus pharyngeus 12/21/2017   Chronic obstructive pulmonary disease (HCC) 12/15/2017   Hyperlipidemia 12/07/2017   Seizures (HCC) 10/01/2016   Grief reaction 04/11/2016    History of breast cancer 12/11/2015   Osteoporosis 11/22/2015   Estrogen deficiency 09/07/2015   Wrinkles 09/07/2015   Post herpetic neuralgia 09/01/2014   Encounter for Medicare annual wellness exam 07/11/2014   Routine general medical examination at a health care facility 06/26/2013   H/O small bowel obstruction 04/15/2013   Complete heart block s/p pacemaker (HCC) 11/03/2011   Pacemaker-[employer] 11/03/2011   Lumbar spinal stenosis 09/02/2011   POSTMENOPAUSAL STATUS 08/06/2009   HIDRADENITIS SUPPURATIVA 06/26/2008   HERNIATED DISC 12/14/2007   Hyperkalemia 08/06/2007   History of colonic polyps 09/14/2006   Former smoker 06/10/2006  Asthma 06/10/2006   H/O idiopathic seizure 06/10/2006   Insomnia 06/10/2006   Past Medical History:  Diagnosis Date   Allergic rhinitis    Arthritis    Asthma    as a child, mild now   Breast cancer (HCC) 12/2015   right breast cancer, lumpectomy and mammosite    Cardiac pacemaker in situ    Colon polyps    colonoscopy 7/08, tubular adenoma   Complete heart block (HCC) 09/2011   s/p PPM implanted in [place] [facility]   COPD (chronic obstructive pulmonary disease) (HCC)    Myocardial infarction (HCC) 2011   Orthostatic hypotension    Osteopenia 10/2015   Pacemaker    2011   Paroxysmal atrial fibrillation (HCC) 03/2021   Incidentally detected on pacemaker interrogation   Personal history of radiation therapy 2017   right breast ca, mammosite placed   Rotator cuff tear, right 02/2022   Had an MRI in [place] I have 2 tears.   Seizure disorder (HCC)    Seizures (HCC)    first one was when she was 74 years old    Small bowel obstruction (HCC)    1988 and 2002   Tobacco abuse    Past Surgical History:  Procedure Laterality Date   ABDOMINAL HYSTERECTOMY     BREAST BIOPSY Right 2007   benign inflammatory changes, mass due to underwire bra   BREAST BIOPSY Left 01/02/2016   columnar cell changes without atypical hyperplasia.    BREAST BIOPSY Right 12/06/2015   rt breast mass 10:00, bx done at Dr. Gilberto office, invasive ductal carcinoma   BREAST EXCISIONAL BIOPSY Left 01/02/2016   COLUMNAR CELL CHANGE AND HYPERPLASIA ASSOCIATED WITH LUMINAL AND STROMAL CALCIFICATIONS   BREAST LUMPECTOMY Right 01/02/2016   invasive mammary carcinoma, clear margins, negative LN   BREAST LUMPECTOMY WITH SENTINEL LYMPH NODE BIOPSY Right 01/02/2016   pT1c, N0; ER/ PR 100%; Her 2 neu not over expressed: BREAST LUMPECTOMY WITH SENTINEL LYMPH NODE BX;  Surgeon: Reyes LELON Cota, MD;  Location: [facility] ORS;  Service: General;  Laterality: Right;   CARDIAC CATHETERIZATION     CAROTID PTA/STENT INTERVENTION Right 04/29/2022   Procedure: CAROTID PTA/STENT INTERVENTION;  Surgeon: Jama Cordella MATSU, MD;  Location: [facility] INVASIVE CV LAB;  Service: Cardiovascular;  Laterality: Right;   CATARACT EXTRACTION Bilateral    COLONOSCOPY  10/2015   Dr Teressa   COLONOSCOPY WITH PROPOFOL  N/A 08/19/2018   Procedure: COLONOSCOPY WITH PROPOFOL ;  Surgeon: Janalyn Keene NOVAK, MD;  Location: [facility] ENDOSCOPY;  Service: Endoscopy;  Laterality: N/A;   ESOPHAGOGASTRODUODENOSCOPY (EGD) WITH PROPOFOL  N/A 08/19/2018   Procedure: ESOPHAGOGASTRODUODENOSCOPY (EGD) WITH PROPOFOL ;  Surgeon: Janalyn Keene NOVAK, MD;  Location: [facility] ENDOSCOPY;  Service: Endoscopy;  Laterality: N/A;   EXPLORATORY LAPAROTOMY  01/25/2001   Exploratory laparotomy, lysis of adhesions, identification of internal hernia secondary to omental adhesion. Prolonged postoperative ileus.   gyn surgery  1993   hysterectomy- form endometriosis   LAPAROSCOPY     PACEMAKER INSERTION  10/24/2011   [employer] Advantio dual chamber PPM implanted by Dr Ronny at [facility] in [place]   PPM GENERATOR CHANGEOUT N/A 12/05/2019   Procedure: PPM GENERATOR HERMA;  Surgeon: Fernande Elspeth BROCKS, MD;  Location: [facility] INVASIVE CV LAB;  Service: Cardiovascular;  Laterality: N/A;   RIGHT/LEFT  HEART CATH AND CORONARY ANGIOGRAPHY Bilateral 07/19/2019   Procedure: RIGHT/LEFT HEART CATH AND CORONARY ANGIOGRAPHY;  Surgeon: Mady Bruckner, MD;  Location: [facility] INVASIVE CV LAB;  Service: Cardiovascular;  Laterality: Bilateral;   TEMPORARY PACEMAKER N/A 12/05/2019   Procedure: TEMPORARY PACEMAKER;  Surgeon: Fernande Elspeth BROCKS, MD;  Location: [facility] INVASIVE CV LAB;  Service: Cardiovascular;  Laterality: N/A;   Social History[1] Family History  Problem Relation Age of Onset   Stroke Mother    Heart disease Mother 31       MI and CABG   Dementia Mother    Coronary artery disease Father    Parkinsonism Father    Breast cancer Sister    Cancer - Cervical Daughter 10       died 04/02/24   Heart attack Brother 36   Colon cancer Neg Hx    Allergies[2] Medications Ordered Prior to Encounter[3]  Review of Systems  Constitutional:  Positive for fatigue. Negative for activity change, appetite change, fever and unexpected weight change.  HENT:  Negative for congestion, ear pain, rhinorrhea, sinus pressure and sore throat.   Eyes:  Negative for pain, redness and visual disturbance.  Respiratory:  Positive for shortness of breath. Negative for cough and wheezing.   Cardiovascular:  Negative for chest pain and palpitations.  Gastrointestinal:  Negative for abdominal pain, blood in stool, constipation and diarrhea.  Endocrine: Negative for polydipsia and polyuria.  Genitourinary:  Negative for dysuria, frequency and urgency.  Musculoskeletal:  Negative for arthralgias, back pain and myalgias.  Skin:  Negative for pallor and rash.  Allergic/Immunologic: Negative for environmental allergies.  Neurological:  Positive for light-headedness. Negative for dizziness, syncope and headaches.  Hematological:  Negative for adenopathy. Does not bruise/bleed easily.  Psychiatric/Behavioral:  Positive for dysphoric mood. Negative for decreased concentration. The patient is not nervous/anxious.        Objective:    Physical Exam Constitutional:      General: She is not in acute distress.    Appearance: Normal appearance. She is well-developed and normal weight. She is not ill-appearing or diaphoretic.     Comments: wheelchair  HENT:     Head: Normocephalic and atraumatic.     Right Ear: Tympanic membrane, ear canal and external ear normal.     Left Ear: Tympanic membrane, ear canal and external ear normal.     Nose: Nose normal. No congestion.     Mouth/Throat:     Mouth: Mucous membranes are moist.     Pharynx: Oropharynx is clear. No posterior oropharyngeal erythema.  Eyes:     General: No scleral icterus.    Extraocular Movements: Extraocular movements intact.     Conjunctiva/sclera: Conjunctivae normal.     Pupils: Pupils are equal, round, and reactive to light.  Neck:     Thyroid : No thyromegaly.     Vascular: No carotid bruit or JVD.  Cardiovascular:     Rate and Rhythm: Normal rate and regular rhythm.     Pulses: Normal pulses.     Heart sounds: Normal heart sounds.     No gallop.  Pulmonary:     Effort: Pulmonary effort is normal. No respiratory distress.     Breath sounds: Normal breath sounds. No wheezing.     Comments: Diffusely distant bs  Chest:     Chest wall: No tenderness.  Abdominal:     General: Bowel sounds are normal. There is no distension or abdominal bruit.     Palpations: Abdomen is soft. There is no mass.     Tenderness: There is no abdominal tenderness.     Hernia: No hernia is present.  Genitourinary:    Comments: Breast exam: No mass,  nodules, thickening, tenderness, bulging, retraction, inflamation, nipple discharge or skin changes noted.  No axillary or clavicular LA.     Musculoskeletal:        General: No tenderness. Normal range of motion.     Cervical back: Normal range of motion and neck supple. No rigidity. No muscular tenderness.     Right lower leg: No edema.     Left lower leg: No edema.     Comments: No kyphosis   Lymphadenopathy:      Cervical: No cervical adenopathy.  Skin:    General: Skin is warm and dry.     Coloration: Skin is not pale.     Findings: No erythema or rash.     Comments: Solar lentigines diffusely Scattered sks   Neurological:     Mental Status: She is alert. Mental status is at baseline.     Cranial Nerves: No cranial nerve deficit.     Motor: No abnormal muscle tone.     Coordination: Coordination normal.     Gait: Gait normal.     Deep Tendon Reflexes: Reflexes are normal and symmetric. Reflexes normal.  Psychiatric:        Mood and Affect: Mood normal.        Cognition and Memory: Cognition and memory normal.           Assessment & Plan:   Assessment & Plan Routine general medical examination at a health care facility Reviewed health habits including diet and exercise and skin cancer prevention Reviewed appropriate screening tests for age  Also reviewed health mt list, fam hx and immunization status , as well as social and family history   See HPI Labs reviewed and ordered Health Maintenance  Topic Date Due   Colon Cancer Screening  08/18/2021   Medicare Annual Wellness Visit  03/05/2024   COVID-19 Vaccine (5 - 2025-26 season) 11/13/2024*   DTaP/Tdap/Td vaccine (3 - Tdap) 03/25/2025*   Zoster (Shingles) Vaccine (1 of 2) 06/23/2025*   Breast Cancer Screening  01/06/2025   Pneumococcal Vaccine for age over 66  Completed   Flu Shot  Completed   Osteoporosis screening with Bone Density Scan  Completed   Hepatitis C Screening  Completed   Meningitis B Vaccine  Aged Out   Screening for Lung Cancer  Discontinued  *Topic was postponed. The date shown is not the original due date.    Declines recall colonoscopy due to age and medical risks  Discussed fall prevention, supplements and exercise for bone density  No recent fractures PHQ 6-discussed symptoms of depression in setting of medical problems      Hyponatremia Lab today Orders:   Comprehensive metabolic panel with  GFR  Chronic orthostatic hypotension Neurogenic from dysautonomia   Finally better controlled with  Droxidopa  100 mg tid (hold for systolic over 120) Minodrine 10 mg tid (hold for systolic over 120) Pyridostigmine  60 mg tid   Continues compression garments and cardiology care       Chronic hypoxic respiratory failure (HCC) Stable Continues 02    Chronic heart failure with preserved ejection fraction (HFpEF) (HCC) Care not to fluid overload  Difficulty supporting blood pressure   Adding to lack of mobility and loss of independence      Gastroesophageal reflux disease without esophagitis Controlled with protonix  40 mg daily  Encouraged to avoid dietary triggers  Unfortunately has bone loss  Has not been able to come off of this      Depression, unspecified depression type  PHQ 6 Frustrated by medical conditions/copd Continues fluoxetine  which has been helpful  ? Consider increase dose Declines counseling for now      Paroxysmal atrial fibrillation (HCC) No changes clinically Under cardiology care Normal rate On eliquis        Iron  deficiency anemia, unspecified iron  deficiency anemia type Lab today Orders:   CBC with Differential/Platelet  Centrilobular emphysema (HCC) Fairly stable  Pulmonary care Continues 02     Age-related osteoporosis without current pathological fracture No new fractures Does have falls from syncope Discussed fall prevention, supplements and exercise for bone density   Was taking prolia   Vitamin D  deficiency D level today Orders:   VITAMIN D  25 Hydroxy (Vit-D Deficiency, Fractures)  Seizures (HCC) None recently  Controlled well with keppra  250 mg bid Under care of neurology     Pure hypercholesterolemia Disc goals for lipids and reasons to control them Rev last labs with pt Rev low sat fat diet in detail Atorvastatin  40 mg daily    Orders:   Comprehensive metabolic panel with GFR   Lipid Panel   TSH   Colon  cancer screening Colonoscopy 2020 with 3 y recall due to multiple polyps Pt declines Would be higher risk for procedure due to copd as well     Decreased GFR Lab today  Better fluid intake   Nephrology care  Orders:   Comprehensive metabolic panel with GFR   Current use of proton pump inhibitor B12 and D levels ordered  Orders:   Vitamin B12   VITAMIN D  25 Hydroxy (Vit-D Deficiency, Fractures)  Chronic fatigue Suspect multifactorial , with medical problems, mobility issue and some symptoms of depression  Offered counseling referral/declined for now  Labs ordered  Orders:   CBC with Differential/Platelet   Comprehensive metabolic panel with GFR   TSH  Stage 3a chronic kidney disease (CKD) (HCC) Lab today  Better fluid intake     Presence of heart assist device [facility]) Under cardiology care  Pacemaker  No clinical changes      Dysautonomia (HCC) Causing orthostatic hypotension  Doing better with current medications           [1]  Social History Tobacco Use   Smoking status: Former    Current packs/day: 0.00    Average packs/day: 1.5 packs/day for 42.0 years (63.0 ttl pk-yrs)    Types: Cigarettes    Start date: 11/10/1968    Quit date: 11/11/2010    Years since quitting: 13.3   Smokeless tobacco: Never  Vaping Use   Vaping status: Never Used  Substance Use Topics   Alcohol use: Yes    Alcohol/week: 1.0 - 2.0 standard drink of alcohol    Types: 1 - 2 Glasses of wine per week    Comment: weekly   Drug use: No  [2]  Allergies Allergen Reactions   Codeine Nausea And Vomiting   Morphine  And Codeine Nausea Only  [3]  Current Outpatient Medications on File Prior to Visit  Medication Sig Dispense Refill   acetaminophen  (TYLENOL ) 500 MG tablet Take 1,000 mg by mouth every 8 (eight) hours as needed.     albuterol  (VENTOLIN  HFA) 108 (90 Base) MCG/ACT inhaler Inhale 2 puffs into the lungs every 4 (four) hours as needed for wheezing or shortness of breath. 18  g 0   apixaban  (ELIQUIS ) 5 MG TABS tablet Take 1 tablet (5 mg total) by mouth 2 (two) times daily. 60 tablet 0   aspirin  EC 81 MG tablet Take  1 tablet (81 mg total) by mouth daily. 150 tablet 2   atorvastatin  (LIPITOR) 40 MG tablet Take 1 tablet (40 mg total) by mouth daily. 30 tablet 0   benzonatate  (TESSALON ) 200 MG capsule Take 1 capsule (200 mg total) by mouth 2 (two) times daily as needed for cough. 20 capsule 0   Cholecalciferol  (VITAMIN D3) 50 MCG (2000 UT) capsule Take 2,000 Units by mouth daily.     droxidopa  (NORTHERA ) 100 MG CAPS Take 1 capsule (100 mg total) by mouth 3 (three) times daily with meals. Hold for systolic blood pressure over 120 90 capsule 5   FLUoxetine  (PROZAC ) 20 MG capsule Take 1 capsule (20 mg total) by mouth daily. 90 capsule 1   ipratropium-albuterol  (DUONEB) 0.5-2.5 (3) MG/3ML SOLN Take 3 mLs by nebulization every 6 (six) hours as needed. 360 mL 3   levETIRAcetam  (KEPPRA ) 250 MG tablet Take 1 tablet (250 mg total) by mouth 2 (two) times daily. 60 tablet 0   lidocaine  4 % Place 1 patch onto the skin daily. 30 patch 0   midodrine  (PROAMATINE ) 10 MG tablet Take 1 tablet (10 mg total) by mouth 3 (three) times daily with meals. Hold for systolic blood pressure over 120 90 tablet 5   Nebulizers (COMPRESSOR/NEBULIZER) MISC For use with medication for the lungs.  Use as directed. 1 each 0   OXYGEN  Inhale 4 L into the lungs continuous.     pantoprazole  (PROTONIX ) 40 MG tablet Take 1 tablet (40 mg total) by mouth daily. 90 tablet 1   Propylene Glycol 0.6 % SOLN Apply 1 drop to eye daily as needed.     pyridostigmine  (MESTINON ) 60 MG tablet Take 1 tablet (60 mg total) by mouth 3 (three) times daily. 90 tablet 5   sodium chloride  1 g tablet Take 1 tablet (1 g total) by mouth 2 (two) times daily with a meal. 60 tablet 1   zolpidem  (AMBIEN ) 10 MG tablet TAKE 1/2 TO 1 TABLET BY MOUTH AT BEDTIME AS NEEDED FOR SLEEP 30 tablet 0   No current facility-administered medications on  file prior to visit.   "

## 2024-03-25 NOTE — Assessment & Plan Note (Signed)
 No new fractures Does have falls from syncope Discussed fall prevention, supplements and exercise for bone density   Was taking prolia

## 2024-03-25 NOTE — Assessment & Plan Note (Signed)
 B12 and D levels ordered  Orders:   Vitamin B12   VITAMIN D  25 Hydroxy (Vit-D Deficiency, Fractures)

## 2024-03-26 LAB — CBC WITH DIFFERENTIAL/PLATELET
Absolute Lymphocytes: 1155 {cells}/uL (ref 850–3900)
Absolute Monocytes: 567 {cells}/uL (ref 200–950)
Basophils Absolute: 63 {cells}/uL (ref 0–200)
Basophils Relative: 0.9 %
Eosinophils Absolute: 182 {cells}/uL (ref 15–500)
Eosinophils Relative: 2.6 %
HCT: 38.5 % (ref 35.9–46.0)
Hemoglobin: 12.4 g/dL (ref 11.7–15.5)
MCH: 28.7 pg (ref 27.0–33.0)
MCHC: 32.2 g/dL (ref 31.6–35.4)
MCV: 89.1 fL (ref 81.4–101.7)
MPV: 10.7 fL (ref 7.5–12.5)
Monocytes Relative: 8.1 %
Neutro Abs: 5033 {cells}/uL (ref 1500–7800)
Neutrophils Relative %: 71.9 %
Platelets: 329 10*3/uL (ref 140–400)
RBC: 4.32 Million/uL (ref 3.80–5.10)
RDW: 13.6 % (ref 11.0–15.0)
Total Lymphocyte: 16.5 %
WBC: 7 10*3/uL (ref 3.8–10.8)

## 2024-03-26 LAB — COMPREHENSIVE METABOLIC PANEL WITH GFR
AG Ratio: 1.6 (calc) (ref 1.0–2.5)
ALT: 11 U/L (ref 6–29)
AST: 14 U/L (ref 10–35)
Albumin: 4.3 g/dL (ref 3.6–5.1)
Alkaline phosphatase (APISO): 76 U/L (ref 37–153)
BUN: 24 mg/dL (ref 7–25)
CO2: 24 mmol/L (ref 20–32)
Calcium: 9.8 mg/dL (ref 8.6–10.4)
Chloride: 105 mmol/L (ref 98–110)
Creat: 0.98 mg/dL (ref 0.60–1.00)
Globulin: 2.7 g/dL (ref 1.9–3.7)
Glucose, Bld: 99 mg/dL (ref 65–99)
Potassium: 5.1 mmol/L (ref 3.5–5.3)
Sodium: 141 mmol/L (ref 135–146)
Total Bilirubin: 0.7 mg/dL (ref 0.2–1.2)
Total Protein: 7 g/dL (ref 6.1–8.1)
eGFR: 60 mL/min/{1.73_m2}

## 2024-03-26 LAB — VITAMIN B12: Vitamin B-12: 313 pg/mL (ref 200–1100)

## 2024-03-26 LAB — LIPID PANEL
Cholesterol: 180 mg/dL
HDL: 73 mg/dL
LDL Cholesterol (Calc): 88 mg/dL
Non-HDL Cholesterol (Calc): 107 mg/dL
Total CHOL/HDL Ratio: 2.5 (calc)
Triglycerides: 97 mg/dL

## 2024-03-26 LAB — VITAMIN D 25 HYDROXY (VIT D DEFICIENCY, FRACTURES): Vit D, 25-Hydroxy: 40 ng/mL (ref 30–100)

## 2024-03-26 LAB — TSH: TSH: 1.41 m[IU]/L (ref 0.40–4.50)

## 2024-03-26 NOTE — Assessment & Plan Note (Signed)
 Suspect multifactorial , with medical problems, mobility issue and some symptoms of depression  Offered counseling referral/declined for now  Labs ordered  Orders:   CBC with Differential/Platelet   Comprehensive metabolic panel with GFR   TSH

## 2024-03-27 ENCOUNTER — Ambulatory Visit: Payer: Self-pay | Admitting: Family Medicine

## 2024-03-27 DIAGNOSIS — N1831 Chronic kidney disease, stage 3a: Secondary | ICD-10-CM | POA: Insufficient documentation

## 2024-03-27 NOTE — Assessment & Plan Note (Signed)
 Lab today  Better fluid intake

## 2024-03-27 NOTE — Assessment & Plan Note (Signed)
 Causing orthostatic hypotension  Doing better with current medications

## 2024-03-27 NOTE — Assessment & Plan Note (Signed)
 Under cardiology care  Pacemaker  No clinical changes

## 2024-04-20 ENCOUNTER — Ambulatory Visit: Admitting: Cardiology

## 2024-05-30 ENCOUNTER — Encounter (INDEPENDENT_AMBULATORY_CARE_PROVIDER_SITE_OTHER)

## 2024-05-30 ENCOUNTER — Ambulatory Visit (INDEPENDENT_AMBULATORY_CARE_PROVIDER_SITE_OTHER): Admitting: Vascular Surgery

## 2024-06-13 ENCOUNTER — Encounter

## 2024-07-28 ENCOUNTER — Ambulatory Visit

## 2024-09-12 ENCOUNTER — Encounter

## 2024-12-12 ENCOUNTER — Encounter

## 2025-03-13 ENCOUNTER — Encounter
